# Patient Record
Sex: Female | Born: 1940
Health system: Southern US, Community
[De-identification: ages and names within clinical notes are randomized; demographics above are authoritative.]

## PROBLEM LIST (undated history)

## (undated) DIAGNOSIS — K859 Acute pancreatitis without necrosis or infection, unspecified: Secondary | ICD-10-CM

## (undated) DIAGNOSIS — Z9289 Personal history of other medical treatment: Secondary | ICD-10-CM

## (undated) DIAGNOSIS — E782 Mixed hyperlipidemia: Secondary | ICD-10-CM

## (undated) DIAGNOSIS — T7840XA Allergy, unspecified, initial encounter: Secondary | ICD-10-CM

## (undated) DIAGNOSIS — I679 Cerebrovascular disease, unspecified: Secondary | ICD-10-CM

## (undated) DIAGNOSIS — K602 Anal fissure, unspecified: Secondary | ICD-10-CM

## (undated) DIAGNOSIS — IMO0002 Reserved for concepts with insufficient information to code with codable children: Secondary | ICD-10-CM

## (undated) DIAGNOSIS — I639 Cerebral infarction, unspecified: Secondary | ICD-10-CM

## (undated) DIAGNOSIS — I341 Nonrheumatic mitral (valve) prolapse: Secondary | ICD-10-CM

## (undated) DIAGNOSIS — G894 Chronic pain syndrome: Secondary | ICD-10-CM

## (undated) DIAGNOSIS — M542 Cervicalgia: Secondary | ICD-10-CM

## (undated) DIAGNOSIS — M1990 Unspecified osteoarthritis, unspecified site: Secondary | ICD-10-CM

## (undated) DIAGNOSIS — I10 Essential (primary) hypertension: Secondary | ICD-10-CM

## (undated) DIAGNOSIS — I2510 Atherosclerotic heart disease of native coronary artery without angina pectoris: Secondary | ICD-10-CM

## (undated) DIAGNOSIS — K5790 Diverticulosis of intestine, part unspecified, without perforation or abscess without bleeding: Secondary | ICD-10-CM

## (undated) DIAGNOSIS — G709 Myoneural disorder, unspecified: Secondary | ICD-10-CM

## (undated) DIAGNOSIS — K559 Vascular disorder of intestine, unspecified: Secondary | ICD-10-CM

## (undated) DIAGNOSIS — K589 Irritable bowel syndrome without diarrhea: Secondary | ICD-10-CM

## (undated) DIAGNOSIS — K648 Other hemorrhoids: Secondary | ICD-10-CM

## (undated) DIAGNOSIS — N390 Urinary tract infection, site not specified: Secondary | ICD-10-CM

## (undated) DIAGNOSIS — H269 Unspecified cataract: Secondary | ICD-10-CM

## (undated) DIAGNOSIS — K219 Gastro-esophageal reflux disease without esophagitis: Secondary | ICD-10-CM

## (undated) DIAGNOSIS — R112 Nausea with vomiting, unspecified: Secondary | ICD-10-CM

## (undated) DIAGNOSIS — Z8679 Personal history of other diseases of the circulatory system: Secondary | ICD-10-CM

## (undated) DIAGNOSIS — D126 Benign neoplasm of colon, unspecified: Secondary | ICD-10-CM

## (undated) DIAGNOSIS — E119 Type 2 diabetes mellitus without complications: Secondary | ICD-10-CM

## (undated) DIAGNOSIS — R351 Nocturia: Secondary | ICD-10-CM

## (undated) DIAGNOSIS — Z9889 Other specified postprocedural states: Secondary | ICD-10-CM

## (undated) DIAGNOSIS — F419 Anxiety disorder, unspecified: Secondary | ICD-10-CM

## (undated) DIAGNOSIS — Z8719 Personal history of other diseases of the digestive system: Secondary | ICD-10-CM

## (undated) DIAGNOSIS — I493 Ventricular premature depolarization: Secondary | ICD-10-CM

## (undated) DIAGNOSIS — G8929 Other chronic pain: Secondary | ICD-10-CM

## (undated) DIAGNOSIS — Z96659 Presence of unspecified artificial knee joint: Secondary | ICD-10-CM

## (undated) DIAGNOSIS — R42 Dizziness and giddiness: Secondary | ICD-10-CM

## (undated) DIAGNOSIS — T8459XA Infection and inflammatory reaction due to other internal joint prosthesis, initial encounter: Secondary | ICD-10-CM

## (undated) DIAGNOSIS — N189 Chronic kidney disease, unspecified: Secondary | ICD-10-CM

## (undated) DIAGNOSIS — I219 Acute myocardial infarction, unspecified: Secondary | ICD-10-CM

## (undated) HISTORY — DX: Benign neoplasm of colon, unspecified: D12.6

## (undated) HISTORY — DX: Urinary tract infection, site not specified: N39.0

## (undated) HISTORY — PX: TOTAL ABDOMINAL HYSTERECTOMY: SHX209

## (undated) HISTORY — DX: Ventricular premature depolarization: I49.3

## (undated) HISTORY — DX: Anxiety disorder, unspecified: F41.9

## (undated) HISTORY — DX: Unspecified osteoarthritis, unspecified site: M19.90

## (undated) HISTORY — DX: Personal history of other medical treatment: Z92.89

## (undated) HISTORY — DX: Anal fissure, unspecified: K60.2

## (undated) HISTORY — PX: TONSILLECTOMY: SHX5217

## (undated) HISTORY — DX: Allergy, unspecified, initial encounter: T78.40XA

## (undated) HISTORY — DX: Essential (primary) hypertension: I10

## (undated) HISTORY — DX: Gastro-esophageal reflux disease without esophagitis: K21.9

## (undated) HISTORY — PX: POLYPECTOMY: SHX149

## (undated) HISTORY — DX: Vascular disorder of intestine, unspecified: K55.9

## (undated) HISTORY — DX: Diverticulosis of intestine, part unspecified, without perforation or abscess without bleeding: K57.90

## (undated) HISTORY — DX: Dizziness and giddiness: R42

## (undated) HISTORY — DX: Reserved for concepts with insufficient information to code with codable children: IMO0002

## (undated) HISTORY — DX: Personal history of other diseases of the circulatory system: Z86.79

## (undated) HISTORY — DX: Atherosclerotic heart disease of native coronary artery without angina pectoris: I25.10

## (undated) HISTORY — DX: Cerebrovascular disease, unspecified: I67.9

## (undated) HISTORY — DX: Type 2 diabetes mellitus without complications: E11.9

## (undated) HISTORY — PX: TEMPOROMANDIBULAR JOINT SURGERY: SHX35

## (undated) HISTORY — DX: Other chronic pain: G89.29

## (undated) HISTORY — DX: Other hemorrhoids: K64.8

## (undated) HISTORY — DX: Mixed hyperlipidemia: E78.2

## (undated) HISTORY — DX: Cerebral infarction, unspecified: I63.9

## (undated) HISTORY — DX: Acute myocardial infarction, unspecified: I21.9

## (undated) HISTORY — DX: Unspecified cataract: H26.9

## (undated) HISTORY — DX: Irritable bowel syndrome, unspecified: K58.9

## (undated) HISTORY — PX: NECK SURGERY: SHX720

## (undated) HISTORY — DX: Myoneural disorder, unspecified: G70.9

## (undated) HISTORY — DX: Chronic pain syndrome: G89.4

## (undated) HISTORY — DX: Cervicalgia: M54.2

## (undated) HISTORY — PX: COLONOSCOPY: SHX174

---
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
## 1998-12-08 ENCOUNTER — Other Ambulatory Visit: Admission: RE | Admit: 1998-12-08 | Discharge: 1998-12-08 | Payer: Self-pay | Admitting: Obstetrics and Gynecology

## 1998-12-31 ENCOUNTER — Emergency Department (HOSPITAL_COMMUNITY): Admission: EM | Admit: 1998-12-31 | Discharge: 1998-12-31 | Payer: Self-pay | Admitting: Emergency Medicine

## 1999-12-20 ENCOUNTER — Other Ambulatory Visit: Admission: RE | Admit: 1999-12-20 | Discharge: 1999-12-20 | Payer: Self-pay | Admitting: Obstetrics and Gynecology

## 2001-01-06 ENCOUNTER — Other Ambulatory Visit: Admission: RE | Admit: 2001-01-06 | Discharge: 2001-01-06 | Payer: Self-pay | Admitting: Obstetrics and Gynecology

## 2001-12-23 ENCOUNTER — Ambulatory Visit (HOSPITAL_COMMUNITY): Admission: RE | Admit: 2001-12-23 | Discharge: 2001-12-23 | Payer: Self-pay | Admitting: Internal Medicine

## 2001-12-23 ENCOUNTER — Encounter: Payer: Self-pay | Admitting: Internal Medicine

## 2002-02-02 ENCOUNTER — Other Ambulatory Visit: Admission: RE | Admit: 2002-02-02 | Discharge: 2002-02-02 | Payer: Self-pay | Admitting: Obstetrics and Gynecology

## 2002-03-25 ENCOUNTER — Ambulatory Visit (HOSPITAL_COMMUNITY): Admission: RE | Admit: 2002-03-25 | Discharge: 2002-03-25 | Payer: Self-pay | Admitting: Neurosurgery

## 2002-03-25 ENCOUNTER — Encounter: Payer: Self-pay | Admitting: Neurosurgery

## 2002-03-27 ENCOUNTER — Encounter: Admission: RE | Admit: 2002-03-27 | Discharge: 2002-03-27 | Payer: Self-pay | Admitting: Neurosurgery

## 2002-03-27 ENCOUNTER — Encounter: Payer: Self-pay | Admitting: Neurosurgery

## 2002-04-03 ENCOUNTER — Encounter: Payer: Self-pay | Admitting: Neurosurgery

## 2002-04-06 ENCOUNTER — Encounter: Payer: Self-pay | Admitting: Neurosurgery

## 2002-04-06 ENCOUNTER — Inpatient Hospital Stay (HOSPITAL_COMMUNITY): Admission: RE | Admit: 2002-04-06 | Discharge: 2002-04-08 | Payer: Self-pay | Admitting: Neurosurgery

## 2003-04-23 ENCOUNTER — Other Ambulatory Visit: Admission: RE | Admit: 2003-04-23 | Discharge: 2003-04-23 | Payer: Self-pay | Admitting: Obstetrics and Gynecology

## 2003-08-25 ENCOUNTER — Encounter: Admission: RE | Admit: 2003-08-25 | Discharge: 2003-08-25 | Payer: Self-pay | Admitting: Urology

## 2003-08-26 ENCOUNTER — Ambulatory Visit (HOSPITAL_COMMUNITY): Admission: RE | Admit: 2003-08-26 | Discharge: 2003-08-26 | Payer: Self-pay | Admitting: Urology

## 2003-08-26 ENCOUNTER — Ambulatory Visit (HOSPITAL_BASED_OUTPATIENT_CLINIC_OR_DEPARTMENT_OTHER): Admission: RE | Admit: 2003-08-26 | Discharge: 2003-08-26 | Payer: Self-pay | Admitting: Urology

## 2003-09-13 ENCOUNTER — Ambulatory Visit (HOSPITAL_BASED_OUTPATIENT_CLINIC_OR_DEPARTMENT_OTHER): Admission: RE | Admit: 2003-09-13 | Discharge: 2003-09-13 | Payer: Self-pay | Admitting: Urology

## 2004-05-12 ENCOUNTER — Other Ambulatory Visit: Admission: RE | Admit: 2004-05-12 | Discharge: 2004-05-12 | Payer: Self-pay | Admitting: Obstetrics and Gynecology

## 2004-07-13 ENCOUNTER — Ambulatory Visit: Payer: Self-pay | Admitting: Pulmonary Disease

## 2004-08-18 ENCOUNTER — Ambulatory Visit: Payer: Self-pay | Admitting: Pulmonary Disease

## 2004-12-11 ENCOUNTER — Ambulatory Visit: Payer: Self-pay | Admitting: Pulmonary Disease

## 2005-01-10 ENCOUNTER — Ambulatory Visit: Payer: Self-pay | Admitting: Gastroenterology

## 2005-01-25 ENCOUNTER — Ambulatory Visit: Payer: Self-pay | Admitting: Gastroenterology

## 2005-03-12 ENCOUNTER — Ambulatory Visit: Payer: Self-pay | Admitting: Pulmonary Disease

## 2005-06-12 ENCOUNTER — Ambulatory Visit: Payer: Self-pay | Admitting: Pulmonary Disease

## 2005-06-20 ENCOUNTER — Ambulatory Visit: Payer: Self-pay | Admitting: Pulmonary Disease

## 2005-07-04 HISTORY — PX: CERVICAL SPINE SURGERY: SHX589

## 2005-07-16 ENCOUNTER — Ambulatory Visit (HOSPITAL_COMMUNITY): Admission: RE | Admit: 2005-07-16 | Discharge: 2005-07-16 | Payer: Self-pay | Admitting: Neurosurgery

## 2005-08-02 ENCOUNTER — Inpatient Hospital Stay (HOSPITAL_COMMUNITY): Admission: RE | Admit: 2005-08-02 | Discharge: 2005-08-03 | Payer: Self-pay | Admitting: Neurosurgery

## 2005-09-03 DIAGNOSIS — I219 Acute myocardial infarction, unspecified: Secondary | ICD-10-CM

## 2005-09-03 HISTORY — PX: CARDIAC CATHETERIZATION: SHX172

## 2005-09-03 HISTORY — PX: CHOLECYSTECTOMY: SHX55

## 2005-09-03 HISTORY — PX: BLADDER REPAIR: SHX76

## 2005-09-03 HISTORY — DX: Acute myocardial infarction, unspecified: I21.9

## 2005-10-15 ENCOUNTER — Ambulatory Visit: Payer: Self-pay | Admitting: Pulmonary Disease

## 2006-02-12 ENCOUNTER — Ambulatory Visit: Payer: Self-pay | Admitting: Pulmonary Disease

## 2006-02-20 ENCOUNTER — Encounter: Admission: RE | Admit: 2006-02-20 | Discharge: 2006-03-19 | Payer: Self-pay | Admitting: Neurosurgery

## 2006-03-25 ENCOUNTER — Ambulatory Visit: Payer: Self-pay | Admitting: Pulmonary Disease

## 2006-03-26 ENCOUNTER — Inpatient Hospital Stay (HOSPITAL_COMMUNITY): Admission: EM | Admit: 2006-03-26 | Discharge: 2006-03-29 | Payer: Self-pay | Admitting: Emergency Medicine

## 2006-03-26 ENCOUNTER — Ambulatory Visit: Payer: Self-pay | Admitting: Cardiology

## 2006-04-09 ENCOUNTER — Ambulatory Visit: Payer: Self-pay | Admitting: Pulmonary Disease

## 2006-04-09 ENCOUNTER — Ambulatory Visit: Payer: Self-pay | Admitting: Cardiology

## 2006-06-07 ENCOUNTER — Ambulatory Visit: Payer: Self-pay | Admitting: Cardiovascular Disease

## 2006-06-17 ENCOUNTER — Ambulatory Visit: Payer: Self-pay | Admitting: Pulmonary Disease

## 2006-06-24 ENCOUNTER — Ambulatory Visit: Payer: Self-pay | Admitting: Pulmonary Disease

## 2006-06-24 LAB — CONVERTED CEMR LAB
ALT: 38 units/L (ref 0–40)
AST: 25 units/L (ref 0–37)
Albumin: 3.4 g/dL — ABNORMAL LOW (ref 3.5–5.2)
Alkaline Phosphatase: 79 units/L (ref 39–117)
BUN: 15 mg/dL (ref 6–23)
Basophils Absolute: 0.1 10*3/uL (ref 0.0–0.1)
Basophils Relative: 0.7 % (ref 0.0–1.0)
CO2: 32 meq/L (ref 19–32)
Calcium: 8.2 mg/dL — ABNORMAL LOW (ref 8.4–10.5)
Chloride: 106 meq/L (ref 96–112)
Chol/HDL Ratio, serum: 3.8
Cholesterol: 116 mg/dL (ref 0–200)
Creatinine, Ser: 0.9 mg/dL (ref 0.4–1.2)
Eosinophil percent: 3 % (ref 0.0–5.0)
GFR calc non Af Amer: 67 mL/min
Glomerular Filtration Rate, Af Am: 81 mL/min/{1.73_m2}
Glucose, Bld: 126 mg/dL — ABNORMAL HIGH (ref 70–99)
HCT: 39.5 % (ref 36.0–46.0)
HDL: 30.6 mg/dL — ABNORMAL LOW (ref >39.0)
Hemoglobin: 13.2 g/dL (ref 12.0–15.0)
Hgb A1c MFr Bld: 5.8 % (ref 4.6–6.0)
LDL Cholesterol: 53 mg/dL (ref 0–99)
Lymphocytes Relative: 23 % (ref 12.0–46.0)
MCHC: 33.4 g/dL (ref 30.0–36.0)
MCV: 96.5 fL (ref 78.0–100.0)
Monocytes Absolute: 1 10*3/uL — ABNORMAL HIGH (ref 0.2–0.7)
Monocytes Relative: 8.4 % (ref 3.0–11.0)
Neutro Abs: 7.5 10*3/uL (ref 1.4–7.7)
Neutrophils Relative %: 64.9 % (ref 43.0–77.0)
Platelets: 351 10*3/uL (ref 150–400)
Potassium: 4.3 meq/L (ref 3.5–5.1)
RBC: 4.1 M/uL (ref 3.87–5.11)
RDW: 12.9 % (ref 11.5–14.6)
Sodium: 142 meq/L (ref 135–145)
TSH: 5.48 microintl units/mL (ref 0.35–5.50)
Total Bilirubin: 0.6 mg/dL (ref 0.3–1.2)
Total Protein: 5.8 g/dL — ABNORMAL LOW (ref 6.0–8.3)
Triglyceride fasting, serum: 164 mg/dL — ABNORMAL HIGH (ref 0–149)
VLDL: 33 mg/dL (ref 0–40)
WBC: 11.7 10*3/uL — ABNORMAL HIGH (ref 4.5–10.5)

## 2006-07-03 ENCOUNTER — Encounter: Admission: RE | Admit: 2006-07-03 | Discharge: 2006-07-03 | Payer: Self-pay | Admitting: Obstetrics and Gynecology

## 2006-08-20 ENCOUNTER — Ambulatory Visit: Payer: Self-pay | Admitting: Pulmonary Disease

## 2006-11-13 ENCOUNTER — Ambulatory Visit: Payer: Self-pay | Admitting: Gastroenterology

## 2006-11-13 LAB — CONVERTED CEMR LAB
ALT: 32 units/L (ref 0–40)
AST: 25 units/L (ref 0–37)
Albumin: 3.6 g/dL (ref 3.5–5.2)
Alkaline Phosphatase: 87 units/L (ref 39–117)
Amylase: 33 units/L (ref 27–131)
BUN: 17 mg/dL (ref 6–23)
Basophils Absolute: 0.1 10*3/uL (ref 0.0–0.1)
Basophils Relative: 1.1 % — ABNORMAL HIGH (ref 0.0–1.0)
CO2: 34 meq/L — ABNORMAL HIGH (ref 19–32)
Calcium: 8.8 mg/dL (ref 8.4–10.5)
Chloride: 102 meq/L (ref 96–112)
Creatinine, Ser: 0.7 mg/dL (ref 0.4–1.2)
Eosinophils Absolute: 0.3 10*3/uL (ref 0.0–0.6)
Eosinophils Relative: 3.4 % (ref 0.0–5.0)
GFR calc Af Amer: 107 mL/min
GFR calc non Af Amer: 89 mL/min
Glucose, Bld: 167 mg/dL — ABNORMAL HIGH (ref 70–99)
HCT: 41.5 % (ref 36.0–46.0)
Hemoglobin: 14.3 g/dL (ref 12.0–15.0)
Lipase: 27 units/L (ref 11.0–59.0)
Lymphocytes Relative: 26.4 % (ref 12.0–46.0)
MCHC: 34.4 g/dL (ref 30.0–36.0)
MCV: 95.7 fL (ref 78.0–100.0)
Monocytes Absolute: 0.6 10*3/uL (ref 0.2–0.7)
Monocytes Relative: 7.5 % (ref 3.0–11.0)
Neutro Abs: 5.1 10*3/uL (ref 1.4–7.7)
Neutrophils Relative %: 61.6 % (ref 43.0–77.0)
Platelets: 326 10*3/uL (ref 150–400)
Potassium: 4.4 meq/L (ref 3.5–5.1)
RBC: 4.33 M/uL (ref 3.87–5.11)
RDW: 12.7 % (ref 11.5–14.6)
Sed Rate: 5 mm/hr (ref 0–25)
Sodium: 141 meq/L (ref 135–145)
Total Bilirubin: 0.6 mg/dL (ref 0.3–1.2)
Total Protein: 6.3 g/dL (ref 6.0–8.3)
WBC: 8.3 10*3/uL (ref 4.5–10.5)

## 2006-11-15 ENCOUNTER — Ambulatory Visit: Payer: Self-pay | Admitting: Cardiovascular Disease

## 2006-12-11 ENCOUNTER — Ambulatory Visit: Payer: Self-pay | Admitting: Gastroenterology

## 2006-12-17 ENCOUNTER — Encounter (INDEPENDENT_AMBULATORY_CARE_PROVIDER_SITE_OTHER): Payer: Self-pay | Admitting: Specialist

## 2006-12-17 ENCOUNTER — Ambulatory Visit: Payer: Self-pay | Admitting: Gastroenterology

## 2007-01-02 ENCOUNTER — Ambulatory Visit: Payer: Self-pay | Admitting: Cardiovascular Disease

## 2007-01-14 ENCOUNTER — Ambulatory Visit: Payer: Self-pay | Admitting: Pulmonary Disease

## 2007-04-16 ENCOUNTER — Ambulatory Visit: Payer: Self-pay | Admitting: Pulmonary Disease

## 2007-04-18 LAB — CONVERTED CEMR LAB
ALT: 34 units/L (ref 0–35)
AST: 27 units/L (ref 0–37)
Albumin: 3.6 g/dL (ref 3.5–5.2)
Alkaline Phosphatase: 83 units/L (ref 39–117)
BUN: 13 mg/dL (ref 6–23)
Basophils Absolute: 0 10*3/uL (ref 0.0–0.1)
Basophils Relative: 0.4 % (ref 0.0–1.0)
Bilirubin, Direct: 0.1 mg/dL (ref 0.0–0.3)
CO2: 37 meq/L — ABNORMAL HIGH (ref 19–32)
Calcium: 9.1 mg/dL (ref 8.4–10.5)
Chloride: 113 meq/L — ABNORMAL HIGH (ref 96–112)
Cholesterol: 127 mg/dL (ref 0–200)
Creatinine, Ser: 0.8 mg/dL (ref 0.4–1.2)
Direct LDL: 60.5 mg/dL
Eosinophils Absolute: 0.2 10*3/uL (ref 0.0–0.6)
Eosinophils Relative: 3.3 % (ref 0.0–5.0)
GFR calc Af Amer: 92 mL/min
GFR calc non Af Amer: 76 mL/min
Glucose, Bld: 162 mg/dL — ABNORMAL HIGH (ref 70–99)
HCT: 39.9 % (ref 36.0–46.0)
HDL: 34.4 mg/dL — ABNORMAL LOW (ref >39.0)
Hemoglobin: 13.9 g/dL (ref 12.0–15.0)
Hgb A1c MFr Bld: 6.5 % — ABNORMAL HIGH (ref 4.6–6.0)
Lymphocytes Relative: 39.1 % (ref 12.0–46.0)
MCHC: 34.9 g/dL (ref 30.0–36.0)
MCV: 97.3 fL (ref 78.0–100.0)
Monocytes Absolute: 0.7 10*3/uL (ref 0.2–0.7)
Monocytes Relative: 10 % (ref 3.0–11.0)
Neutro Abs: 3.2 10*3/uL (ref 1.4–7.7)
Neutrophils Relative %: 47.2 % (ref 43.0–77.0)
Platelets: 318 10*3/uL (ref 150–400)
Potassium: 5 meq/L (ref 3.5–5.1)
RBC: 4.1 M/uL (ref 3.87–5.11)
RDW: 12.4 % (ref 11.5–14.6)
Sodium: 154 meq/L — ABNORMAL HIGH (ref 135–145)
TSH: 3.29 microintl units/mL (ref 0.35–5.50)
Total Bilirubin: 0.8 mg/dL (ref 0.3–1.2)
Total CHOL/HDL Ratio: 3.7
Total Protein: 6.2 g/dL (ref 6.0–8.3)
Triglycerides: 201 mg/dL (ref 0–149)
VLDL: 40 mg/dL (ref 0–40)
WBC: 6.7 10*3/uL (ref 4.5–10.5)

## 2007-06-13 DIAGNOSIS — M1990 Unspecified osteoarthritis, unspecified site: Secondary | ICD-10-CM | POA: Insufficient documentation

## 2007-06-13 DIAGNOSIS — K5730 Diverticulosis of large intestine without perforation or abscess without bleeding: Secondary | ICD-10-CM | POA: Insufficient documentation

## 2007-06-13 DIAGNOSIS — E785 Hyperlipidemia, unspecified: Secondary | ICD-10-CM | POA: Insufficient documentation

## 2007-06-13 DIAGNOSIS — K589 Irritable bowel syndrome without diarrhea: Secondary | ICD-10-CM | POA: Insufficient documentation

## 2007-06-13 DIAGNOSIS — K219 Gastro-esophageal reflux disease without esophagitis: Secondary | ICD-10-CM | POA: Insufficient documentation

## 2007-06-13 DIAGNOSIS — G894 Chronic pain syndrome: Secondary | ICD-10-CM | POA: Insufficient documentation

## 2007-06-13 DIAGNOSIS — E782 Mixed hyperlipidemia: Secondary | ICD-10-CM | POA: Insufficient documentation

## 2007-06-13 DIAGNOSIS — F418 Other specified anxiety disorders: Secondary | ICD-10-CM | POA: Insufficient documentation

## 2007-06-18 ENCOUNTER — Ambulatory Visit: Payer: Self-pay | Admitting: Pulmonary Disease

## 2007-07-28 ENCOUNTER — Ambulatory Visit: Payer: Self-pay | Admitting: Cardiovascular Disease

## 2007-08-07 ENCOUNTER — Encounter: Payer: Self-pay | Admitting: Cardiovascular Disease

## 2007-08-07 ENCOUNTER — Ambulatory Visit: Payer: Self-pay

## 2007-08-15 ENCOUNTER — Ambulatory Visit: Payer: Self-pay | Admitting: Pulmonary Disease

## 2007-08-15 DIAGNOSIS — E118 Type 2 diabetes mellitus with unspecified complications: Secondary | ICD-10-CM | POA: Insufficient documentation

## 2007-08-15 DIAGNOSIS — E119 Type 2 diabetes mellitus without complications: Secondary | ICD-10-CM | POA: Insufficient documentation

## 2007-10-15 ENCOUNTER — Ambulatory Visit: Payer: Self-pay | Admitting: Pulmonary Disease

## 2007-10-15 DIAGNOSIS — R42 Dizziness and giddiness: Secondary | ICD-10-CM | POA: Insufficient documentation

## 2007-10-16 ENCOUNTER — Ambulatory Visit: Payer: Self-pay | Admitting: Cardiology

## 2007-10-21 LAB — CONVERTED CEMR LAB
ALT: 41 units/L — ABNORMAL HIGH (ref 0–35)
AST: 26 units/L (ref 0–37)
Albumin: 3.7 g/dL (ref 3.5–5.2)
Alkaline Phosphatase: 75 units/L (ref 39–117)
BUN: 15 mg/dL (ref 6–23)
Basophils Absolute: 0 10*3/uL (ref 0.0–0.1)
Basophils Relative: 0.3 % (ref 0.0–1.0)
Bilirubin, Direct: 0.1 mg/dL (ref 0.0–0.3)
CO2: 33 meq/L — ABNORMAL HIGH (ref 19–32)
Calcium: 9 mg/dL (ref 8.4–10.5)
Chloride: 103 meq/L (ref 96–112)
Creatinine, Ser: 0.9 mg/dL (ref 0.4–1.2)
Eosinophils Absolute: 0.1 10*3/uL (ref 0.0–0.6)
Eosinophils Relative: 1.9 % (ref 0.0–5.0)
GFR calc Af Amer: 80 mL/min
GFR calc non Af Amer: 66 mL/min
Glucose, Bld: 154 mg/dL — ABNORMAL HIGH (ref 70–99)
HCT: 42.5 % (ref 36.0–46.0)
Hemoglobin: 14.8 g/dL (ref 12.0–15.0)
Lymphocytes Relative: 36.7 % (ref 12.0–46.0)
MCHC: 34.8 g/dL (ref 30.0–36.0)
MCV: 97.9 fL (ref 78.0–100.0)
Monocytes Absolute: 0.5 10*3/uL (ref 0.2–0.7)
Monocytes Relative: 7.1 % (ref 3.0–11.0)
Neutro Abs: 3.8 10*3/uL (ref 1.4–7.7)
Neutrophils Relative %: 54 % (ref 43.0–77.0)
Platelets: 295 10*3/uL (ref 150–400)
Potassium: 4.1 meq/L (ref 3.5–5.1)
RBC: 4.34 M/uL (ref 3.87–5.11)
RDW: 12.6 % (ref 11.5–14.6)
Sodium: 141 meq/L (ref 135–145)
TSH: 2.29 microintl units/mL (ref 0.35–5.50)
Total Bilirubin: 0.5 mg/dL (ref 0.3–1.2)
Total Protein: 6.5 g/dL (ref 6.0–8.3)
WBC: 7 10*3/uL (ref 4.5–10.5)

## 2007-10-22 ENCOUNTER — Encounter (INDEPENDENT_AMBULATORY_CARE_PROVIDER_SITE_OTHER): Payer: Self-pay | Admitting: *Deleted

## 2007-10-28 ENCOUNTER — Ambulatory Visit: Payer: Self-pay | Admitting: Pulmonary Disease

## 2007-11-18 ENCOUNTER — Telehealth (INDEPENDENT_AMBULATORY_CARE_PROVIDER_SITE_OTHER): Payer: Self-pay | Admitting: *Deleted

## 2007-11-18 ENCOUNTER — Ambulatory Visit: Payer: Self-pay | Admitting: Pulmonary Disease

## 2007-11-26 ENCOUNTER — Encounter: Payer: Self-pay | Admitting: Pulmonary Disease

## 2007-11-26 ENCOUNTER — Ambulatory Visit: Payer: Self-pay

## 2007-12-30 ENCOUNTER — Ambulatory Visit: Payer: Self-pay | Admitting: Cardiovascular Disease

## 2008-01-09 ENCOUNTER — Encounter: Payer: Self-pay | Admitting: Pulmonary Disease

## 2008-01-14 ENCOUNTER — Ambulatory Visit: Payer: Self-pay | Admitting: Pulmonary Disease

## 2008-01-14 DIAGNOSIS — M542 Cervicalgia: Secondary | ICD-10-CM | POA: Insufficient documentation

## 2008-01-14 DIAGNOSIS — I2510 Atherosclerotic heart disease of native coronary artery without angina pectoris: Secondary | ICD-10-CM | POA: Insufficient documentation

## 2008-01-14 DIAGNOSIS — I679 Cerebrovascular disease, unspecified: Secondary | ICD-10-CM | POA: Insufficient documentation

## 2008-01-14 DIAGNOSIS — R32 Unspecified urinary incontinence: Secondary | ICD-10-CM | POA: Insufficient documentation

## 2008-01-19 ENCOUNTER — Ambulatory Visit: Payer: Self-pay | Admitting: Cardiology

## 2008-02-05 ENCOUNTER — Ambulatory Visit: Payer: Self-pay

## 2008-02-05 LAB — CONVERTED CEMR LAB
ALT: 31 units/L (ref 0–35)
AST: 25 units/L (ref 0–37)
Albumin: 3.6 g/dL (ref 3.5–5.2)
Alkaline Phosphatase: 64 units/L (ref 39–117)
Bilirubin, Direct: 0.1 mg/dL (ref 0.0–0.3)
Cholesterol: 141 mg/dL (ref 0–200)
Direct LDL: 59.8 mg/dL
HDL: 28.9 mg/dL — ABNORMAL LOW (ref >39.0)
Total Bilirubin: 0.9 mg/dL (ref 0.3–1.2)
Total CHOL/HDL Ratio: 4.9
Total Protein: 6.5 g/dL (ref 6.0–8.3)
Triglycerides: 280 mg/dL (ref 0–149)
VLDL: 56 mg/dL — ABNORMAL HIGH (ref 0–40)

## 2008-02-09 ENCOUNTER — Ambulatory Visit: Payer: Self-pay | Admitting: Cardiology

## 2008-03-08 ENCOUNTER — Telehealth (INDEPENDENT_AMBULATORY_CARE_PROVIDER_SITE_OTHER): Payer: Self-pay | Admitting: *Deleted

## 2008-03-11 ENCOUNTER — Ambulatory Visit: Payer: Self-pay | Admitting: Gastroenterology

## 2008-03-23 ENCOUNTER — Ambulatory Visit: Payer: Self-pay | Admitting: Cardiovascular Disease

## 2008-03-23 LAB — CONVERTED CEMR LAB
ALT: 27 units/L (ref 0–35)
AST: 29 units/L (ref 0–37)
Albumin: 3.8 g/dL (ref 3.5–5.2)
Alkaline Phosphatase: 67 units/L (ref 39–117)
Bilirubin, Direct: 0.1 mg/dL (ref 0.0–0.3)
Cholesterol: 129 mg/dL (ref 0–200)
Direct LDL: 65 mg/dL
HDL: 36 mg/dL — ABNORMAL LOW (ref >39.0)
Total Bilirubin: 0.8 mg/dL (ref 0.3–1.2)
Total CHOL/HDL Ratio: 3.6
Total Protein: 6.8 g/dL (ref 6.0–8.3)
Triglycerides: 265 mg/dL (ref 0–149)
VLDL: 53 mg/dL — ABNORMAL HIGH (ref 0–40)

## 2008-03-25 ENCOUNTER — Ambulatory Visit: Payer: Self-pay | Admitting: Internal Medicine

## 2008-05-13 ENCOUNTER — Ambulatory Visit: Payer: Self-pay | Admitting: Pulmonary Disease

## 2008-05-13 LAB — CONVERTED CEMR LAB
AntiThromb III Func: 102 % (ref 76–126)
Anticardiolipin IgA: 9 (ref <13)
Anticardiolipin IgG: <7 (ref <11)
Anticardiolipin IgM: <7 (ref <10)
Homocysteine: 7.4 micromoles/L (ref 4.0–15.4)
Protein S Ag, Total: 106 % (ref 70–140)
Vit D, 1,25-Dihydroxy: 27 — ABNORMAL LOW (ref 30–89)

## 2008-05-16 DIAGNOSIS — I4949 Other premature depolarization: Secondary | ICD-10-CM | POA: Insufficient documentation

## 2008-05-16 LAB — CONVERTED CEMR LAB
ALT: 27 units/L (ref 0–35)
AST: 25 units/L (ref 0–37)
Albumin: 3.8 g/dL (ref 3.5–5.2)
Alkaline Phosphatase: 60 units/L (ref 39–117)
BUN: 16 mg/dL (ref 6–23)
Basophils Absolute: 0.1 10*3/uL (ref 0.0–0.1)
Basophils Relative: 1.5 % (ref 0.0–3.0)
Bilirubin, Direct: 0.1 mg/dL (ref 0.0–0.3)
CO2: 31 meq/L (ref 19–32)
Calcium: 9 mg/dL (ref 8.4–10.5)
Chloride: 103 meq/L (ref 96–112)
Cholesterol: 152 mg/dL (ref 0–200)
Creatinine, Ser: 0.7 mg/dL (ref 0.4–1.2)
Direct LDL: 70.1 mg/dL
Eosinophils Absolute: 0.2 10*3/uL (ref 0.0–0.7)
Eosinophils Relative: 3.5 % (ref 0.0–5.0)
GFR calc Af Amer: 107 mL/min
GFR calc non Af Amer: 88 mL/min
Glucose, Bld: 141 mg/dL — ABNORMAL HIGH (ref 70–99)
HCT: 41 % (ref 36.0–46.0)
HDL: 29.6 mg/dL — ABNORMAL LOW (ref >39.0)
Hemoglobin: 14 g/dL (ref 12.0–15.0)
Hgb A1c MFr Bld: 6.1 % — ABNORMAL HIGH (ref 4.6–6.0)
Lymphocytes Relative: 32.9 % (ref 12.0–46.0)
MCHC: 34.2 g/dL (ref 30.0–36.0)
MCV: 98.8 fL (ref 78.0–100.0)
Monocytes Absolute: 0.5 10*3/uL (ref 0.1–1.0)
Monocytes Relative: 9.3 % (ref 3.0–12.0)
Neutro Abs: 2.8 10*3/uL (ref 1.4–7.7)
Neutrophils Relative %: 52.8 % (ref 43.0–77.0)
Platelets: 265 10*3/uL (ref 150–400)
Potassium: 3.9 meq/L (ref 3.5–5.1)
RBC: 4.15 M/uL (ref 3.87–5.11)
RDW: 12.1 % (ref 11.5–14.6)
Sodium: 143 meq/L (ref 135–145)
TSH: 2.24 microintl units/mL (ref 0.35–5.50)
Total Bilirubin: 0.8 mg/dL (ref 0.3–1.2)
Total CHOL/HDL Ratio: 5.1
Total Protein: 6.8 g/dL (ref 6.0–8.3)
Triglycerides: 225 mg/dL (ref 0–149)
VLDL: 45 mg/dL — ABNORMAL HIGH (ref 0–40)
WBC: 5.3 10*3/uL (ref 4.5–10.5)

## 2008-05-24 ENCOUNTER — Ambulatory Visit: Payer: Self-pay | Admitting: Cardiovascular Disease

## 2008-07-06 ENCOUNTER — Ambulatory Visit: Payer: Self-pay | Admitting: Cardiovascular Disease

## 2008-09-06 ENCOUNTER — Ambulatory Visit: Payer: Self-pay | Admitting: Cardiovascular Disease

## 2008-09-06 LAB — CONVERTED CEMR LAB
ALT: 31 units/L (ref 0–35)
AST: 28 units/L (ref 0–37)
Albumin: 3.6 g/dL (ref 3.5–5.2)
Alkaline Phosphatase: 66 units/L (ref 39–117)
Bilirubin, Direct: 0.1 mg/dL (ref 0.0–0.3)
Cholesterol: 137 mg/dL (ref 0–200)
Direct LDL: 58.1 mg/dL
HDL: 38.8 mg/dL — ABNORMAL LOW (ref >39.0)
Total Bilirubin: 0.6 mg/dL (ref 0.3–1.2)
Total CHOL/HDL Ratio: 3.5
Total Protein: 6.5 g/dL (ref 6.0–8.3)
Triglycerides: 203 mg/dL (ref 0–149)
VLDL: 41 mg/dL — ABNORMAL HIGH (ref 0–40)

## 2008-09-09 ENCOUNTER — Ambulatory Visit: Payer: Self-pay | Admitting: Cardiovascular Disease

## 2008-11-10 ENCOUNTER — Ambulatory Visit: Payer: Self-pay | Admitting: Pulmonary Disease

## 2008-11-10 DIAGNOSIS — N3000 Acute cystitis without hematuria: Secondary | ICD-10-CM | POA: Insufficient documentation

## 2008-11-10 LAB — CONVERTED CEMR LAB: Vit D, 25-Hydroxy: 38 ng/mL (ref 30–89)

## 2008-11-13 LAB — CONVERTED CEMR LAB
BUN: 11 mg/dL (ref 6–23)
Basophils Absolute: 0 10*3/uL (ref 0.0–0.1)
Basophils Relative: 0.6 % (ref 0.0–3.0)
Bilirubin Urine: NEGATIVE
CO2: 33 meq/L — ABNORMAL HIGH (ref 19–32)
Calcium: 9.1 mg/dL (ref 8.4–10.5)
Chloride: 101 meq/L (ref 96–112)
Creatinine, Ser: 0.8 mg/dL (ref 0.4–1.2)
Crystals: NEGATIVE
Eosinophils Absolute: 0.3 10*3/uL (ref 0.0–0.7)
Eosinophils Relative: 4.3 % (ref 0.0–5.0)
GFR calc Af Amer: 91 mL/min
GFR calc non Af Amer: 76 mL/min
Glucose, Bld: 140 mg/dL — ABNORMAL HIGH (ref 70–99)
HCT: 41.6 % (ref 36.0–46.0)
Hemoglobin, Urine: NEGATIVE
Hemoglobin: 14.2 g/dL (ref 12.0–15.0)
Ketones, ur: NEGATIVE mg/dL
Lymphocytes Relative: 40.7 % (ref 12.0–46.0)
MCHC: 34.1 g/dL (ref 30.0–36.0)
MCV: 97.8 fL (ref 78.0–100.0)
Monocytes Absolute: 0.6 10*3/uL (ref 0.1–1.0)
Monocytes Relative: 8.6 % (ref 3.0–12.0)
Mucus, UA: NEGATIVE
Neutro Abs: 3.4 10*3/uL (ref 1.4–7.7)
Neutrophils Relative %: 45.8 % (ref 43.0–77.0)
Nitrite: NEGATIVE
Platelets: 258 10*3/uL (ref 150–400)
Potassium: 3.8 meq/L (ref 3.5–5.1)
RBC: 4.25 M/uL (ref 3.87–5.11)
RDW: 12.4 % (ref 11.5–14.6)
Sodium: 142 meq/L (ref 135–145)
Specific Gravity, Urine: <=1.005 (ref 1.000–1.035)
Squamous Epithelial / HPF: NEGATIVE /lpf
TSH: 2.92 microintl units/mL (ref 0.35–5.50)
Total Protein, Urine: NEGATIVE mg/dL
Urine Glucose: NEGATIVE mg/dL
Urobilinogen, UA: 0.2 (ref 0.0–1.0)
WBC: 7.2 10*3/uL (ref 4.5–10.5)
pH: 6.5 (ref 5.0–8.0)

## 2009-01-06 ENCOUNTER — Encounter: Payer: Self-pay | Admitting: Pulmonary Disease

## 2009-01-12 ENCOUNTER — Encounter: Payer: Self-pay | Admitting: Pulmonary Disease

## 2009-02-01 ENCOUNTER — Ambulatory Visit: Payer: Self-pay | Admitting: Cardiovascular Disease

## 2009-02-03 ENCOUNTER — Ambulatory Visit: Payer: Self-pay | Admitting: Internal Medicine

## 2009-05-10 ENCOUNTER — Ambulatory Visit: Payer: Self-pay | Admitting: Pulmonary Disease

## 2009-05-15 LAB — CONVERTED CEMR LAB
ALT: 30 units/L (ref 0–35)
AST: 24 units/L (ref 0–37)
Albumin: 4.1 g/dL (ref 3.5–5.2)
Alkaline Phosphatase: 69 units/L (ref 39–117)
BUN: 12 mg/dL (ref 6–23)
Basophils Absolute: 0.1 10*3/uL (ref 0.0–0.1)
Basophils Relative: 0.6 % (ref 0.0–3.0)
Bilirubin, Direct: 0.1 mg/dL (ref 0.0–0.3)
CO2: 34 meq/L — ABNORMAL HIGH (ref 19–32)
Calcium: 9.2 mg/dL (ref 8.4–10.5)
Chloride: 101 meq/L (ref 96–112)
Cholesterol: 154 mg/dL (ref 0–200)
Creatinine, Ser: 0.7 mg/dL (ref 0.4–1.2)
Direct LDL: 81.1 mg/dL
Eosinophils Absolute: 0.3 10*3/uL (ref 0.0–0.7)
Eosinophils Relative: 2.9 % (ref 0.0–5.0)
GFR calc non Af Amer: 88.05 mL/min (ref >60)
Glucose, Bld: 136 mg/dL — ABNORMAL HIGH (ref 70–99)
HCT: 42.6 % (ref 36.0–46.0)
HDL: 41.8 mg/dL (ref >39.00)
Hemoglobin: 14.8 g/dL (ref 12.0–15.0)
Hgb A1c MFr Bld: 6.1 % (ref 4.6–6.5)
Lymphocytes Relative: 30.4 % (ref 12.0–46.0)
Lymphs Abs: 2.6 10*3/uL (ref 0.7–4.0)
MCHC: 34.8 g/dL (ref 30.0–36.0)
MCV: 97.8 fL (ref 78.0–100.0)
Monocytes Absolute: 0.6 10*3/uL (ref 0.1–1.0)
Monocytes Relative: 6.4 % (ref 3.0–12.0)
Neutro Abs: 5.1 10*3/uL (ref 1.4–7.7)
Neutrophils Relative %: 59.7 % (ref 43.0–77.0)
Platelets: 270 10*3/uL (ref 150.0–400.0)
Potassium: 4.5 meq/L (ref 3.5–5.1)
RBC: 4.36 M/uL (ref 3.87–5.11)
RDW: 12.3 % (ref 11.5–14.6)
Sodium: 142 meq/L (ref 135–145)
Total Bilirubin: 0.7 mg/dL (ref 0.3–1.2)
Total CHOL/HDL Ratio: 4
Total Protein: 6.9 g/dL (ref 6.0–8.3)
Triglycerides: 225 mg/dL — ABNORMAL HIGH (ref 0.0–149.0)
VLDL: 45 mg/dL — ABNORMAL HIGH (ref 0.0–40.0)
WBC: 8.7 10*3/uL (ref 4.5–10.5)

## 2009-05-20 ENCOUNTER — Encounter: Payer: Self-pay | Admitting: Pulmonary Disease

## 2009-08-03 DIAGNOSIS — I10 Essential (primary) hypertension: Secondary | ICD-10-CM | POA: Insufficient documentation

## 2009-08-03 DIAGNOSIS — Z8679 Personal history of other diseases of the circulatory system: Secondary | ICD-10-CM | POA: Insufficient documentation

## 2009-08-04 ENCOUNTER — Encounter (INDEPENDENT_AMBULATORY_CARE_PROVIDER_SITE_OTHER): Payer: Self-pay | Admitting: *Deleted

## 2009-08-04 ENCOUNTER — Ambulatory Visit: Payer: Self-pay | Admitting: Cardiovascular Disease

## 2009-08-04 LAB — CONVERTED CEMR LAB
BUN: 7 mg/dL (ref 6–23)
Basophils Absolute: 0.1 10*3/uL (ref 0.0–0.1)
Basophils Relative: 1.2 % (ref 0.0–3.0)
CO2: 32 meq/L (ref 19–32)
Calcium: 8.6 mg/dL (ref 8.4–10.5)
Chloride: 104 meq/L (ref 96–112)
Creatinine, Ser: 0.7 mg/dL (ref 0.4–1.2)
Eosinophils Absolute: 0.2 10*3/uL (ref 0.0–0.7)
Eosinophils Relative: 2.4 % (ref 0.0–5.0)
GFR calc non Af Amer: 87.99 mL/min (ref >60)
Glucose, Bld: 151 mg/dL — ABNORMAL HIGH (ref 70–99)
HCT: 41.7 % (ref 36.0–46.0)
Hemoglobin: 14.2 g/dL (ref 12.0–15.0)
INR: 1 (ref 0.8–1.0)
Lymphocytes Relative: 36.2 % (ref 12.0–46.0)
Lymphs Abs: 2.6 10*3/uL (ref 0.7–4.0)
MCHC: 34.1 g/dL (ref 30.0–36.0)
MCV: 99.7 fL (ref 78.0–100.0)
Monocytes Absolute: 0.5 10*3/uL (ref 0.1–1.0)
Monocytes Relative: 6.7 % (ref 3.0–12.0)
Neutro Abs: 3.7 10*3/uL (ref 1.4–7.7)
Neutrophils Relative %: 53.5 % (ref 43.0–77.0)
Platelets: 262 10*3/uL (ref 150.0–400.0)
Potassium: 4.3 meq/L (ref 3.5–5.1)
Prothrombin Time: 10.5 s (ref 9.1–11.7)
RBC: 4.18 M/uL (ref 3.87–5.11)
RDW: 12.8 % (ref 11.5–14.6)
Sodium: 148 meq/L — ABNORMAL HIGH (ref 135–145)
WBC: 7.1 10*3/uL (ref 4.5–10.5)

## 2009-08-05 ENCOUNTER — Encounter: Payer: Self-pay | Admitting: Cardiovascular Disease

## 2009-08-10 ENCOUNTER — Inpatient Hospital Stay (HOSPITAL_BASED_OUTPATIENT_CLINIC_OR_DEPARTMENT_OTHER): Admission: RE | Admit: 2009-08-10 | Discharge: 2009-08-10 | Payer: Self-pay | Admitting: Cardiovascular Disease

## 2009-08-10 ENCOUNTER — Ambulatory Visit: Payer: Self-pay | Admitting: Cardiovascular Disease

## 2009-08-26 ENCOUNTER — Emergency Department (HOSPITAL_COMMUNITY): Admission: EM | Admit: 2009-08-26 | Discharge: 2009-08-26 | Payer: Self-pay | Admitting: Emergency Medicine

## 2009-08-26 ENCOUNTER — Encounter (INDEPENDENT_AMBULATORY_CARE_PROVIDER_SITE_OTHER): Payer: Self-pay | Admitting: Emergency Medicine

## 2009-08-26 ENCOUNTER — Telehealth: Payer: Self-pay | Admitting: Nurse Practitioner

## 2009-08-26 ENCOUNTER — Ambulatory Visit: Payer: Self-pay | Admitting: Vascular Surgery

## 2009-08-30 ENCOUNTER — Telehealth (INDEPENDENT_AMBULATORY_CARE_PROVIDER_SITE_OTHER): Payer: Self-pay | Admitting: *Deleted

## 2009-08-31 ENCOUNTER — Ambulatory Visit: Payer: Self-pay

## 2009-08-31 ENCOUNTER — Encounter (HOSPITAL_COMMUNITY): Admission: RE | Admit: 2009-08-31 | Discharge: 2009-11-07 | Payer: Self-pay | Admitting: Cardiovascular Disease

## 2009-08-31 ENCOUNTER — Encounter: Payer: Self-pay | Admitting: Cardiovascular Disease

## 2009-08-31 ENCOUNTER — Ambulatory Visit: Payer: Self-pay | Admitting: Cardiovascular Disease

## 2009-09-03 HISTORY — PX: EYE SURGERY: SHX253

## 2009-09-07 ENCOUNTER — Ambulatory Visit: Payer: Self-pay | Admitting: Pulmonary Disease

## 2009-09-08 ENCOUNTER — Ambulatory Visit: Payer: Self-pay | Admitting: Cardiovascular Disease

## 2009-09-12 ENCOUNTER — Telehealth (INDEPENDENT_AMBULATORY_CARE_PROVIDER_SITE_OTHER): Payer: Self-pay | Admitting: *Deleted

## 2009-09-13 ENCOUNTER — Encounter: Payer: Self-pay | Admitting: Pulmonary Disease

## 2009-09-22 ENCOUNTER — Ambulatory Visit: Payer: Self-pay | Admitting: Cardiovascular Disease

## 2009-09-23 ENCOUNTER — Encounter: Payer: Self-pay | Admitting: Pulmonary Disease

## 2009-09-27 LAB — CONVERTED CEMR LAB
BUN: 9 mg/dL (ref 6–23)
CO2: 35 meq/L — ABNORMAL HIGH (ref 19–32)
Calcium: 8.9 mg/dL (ref 8.4–10.5)
Chloride: 97 meq/L (ref 96–112)
Creatinine, Ser: 0.6 mg/dL (ref 0.4–1.2)
GFR calc non Af Amer: 105.08 mL/min (ref >60)
Glucose, Bld: 158 mg/dL — ABNORMAL HIGH (ref 70–99)
Potassium: 3.7 meq/L (ref 3.5–5.1)
Sodium: 139 meq/L (ref 135–145)

## 2009-12-05 ENCOUNTER — Telehealth (INDEPENDENT_AMBULATORY_CARE_PROVIDER_SITE_OTHER): Payer: Self-pay | Admitting: *Deleted

## 2009-12-06 ENCOUNTER — Ambulatory Visit: Payer: Self-pay | Admitting: Pulmonary Disease

## 2009-12-06 DIAGNOSIS — J209 Acute bronchitis, unspecified: Secondary | ICD-10-CM | POA: Insufficient documentation

## 2009-12-16 ENCOUNTER — Encounter: Admission: RE | Admit: 2009-12-16 | Discharge: 2009-12-16 | Payer: Self-pay | Admitting: Obstetrics and Gynecology

## 2010-01-05 ENCOUNTER — Ambulatory Visit: Payer: Self-pay | Admitting: Pulmonary Disease

## 2010-01-18 ENCOUNTER — Encounter: Payer: Self-pay | Admitting: Pulmonary Disease

## 2010-02-20 ENCOUNTER — Encounter: Payer: Self-pay | Admitting: Cardiovascular Disease

## 2010-02-20 ENCOUNTER — Telehealth (INDEPENDENT_AMBULATORY_CARE_PROVIDER_SITE_OTHER): Payer: Self-pay | Admitting: *Deleted

## 2010-02-21 ENCOUNTER — Encounter: Payer: Self-pay | Admitting: Pulmonary Disease

## 2010-03-13 ENCOUNTER — Telehealth: Payer: Self-pay | Admitting: Cardiovascular Disease

## 2010-03-24 ENCOUNTER — Encounter: Payer: Self-pay | Admitting: Pulmonary Disease

## 2010-04-20 ENCOUNTER — Ambulatory Visit: Payer: Self-pay | Admitting: Pulmonary Disease

## 2010-04-22 LAB — CONVERTED CEMR LAB
ALT: 29 units/L (ref 0–35)
AST: 25 units/L (ref 0–37)
Albumin: 4 g/dL (ref 3.5–5.2)
Alkaline Phosphatase: 71 units/L (ref 39–117)
BUN: 7 mg/dL (ref 6–23)
Basophils Absolute: 0.1 10*3/uL (ref 0.0–0.1)
Basophils Relative: 0.9 % (ref 0.0–3.0)
Bilirubin, Direct: 0.2 mg/dL (ref 0.0–0.3)
CO2: 33 meq/L — ABNORMAL HIGH (ref 19–32)
Calcium: 9 mg/dL (ref 8.4–10.5)
Chloride: 94 meq/L — ABNORMAL LOW (ref 96–112)
Cholesterol: 118 mg/dL (ref 0–200)
Creatinine, Ser: 0.7 mg/dL (ref 0.4–1.2)
Direct LDL: 45.2 mg/dL
Eosinophils Absolute: 0.2 10*3/uL (ref 0.0–0.7)
Eosinophils Relative: 3 % (ref 0.0–5.0)
GFR calc non Af Amer: 87.81 mL/min (ref >60)
Glucose, Bld: 137 mg/dL — ABNORMAL HIGH (ref 70–99)
HCT: 40.2 % (ref 36.0–46.0)
HDL: 31.5 mg/dL — ABNORMAL LOW (ref >39.00)
Hemoglobin: 13.9 g/dL (ref 12.0–15.0)
Hgb A1c MFr Bld: 7.1 % — ABNORMAL HIGH (ref 4.6–6.5)
Lymphocytes Relative: 39.6 % (ref 12.0–46.0)
Lymphs Abs: 3 10*3/uL (ref 0.7–4.0)
MCHC: 34.5 g/dL (ref 30.0–36.0)
MCV: 96.5 fL (ref 78.0–100.0)
Monocytes Absolute: 0.6 10*3/uL (ref 0.1–1.0)
Monocytes Relative: 8.3 % (ref 3.0–12.0)
Neutro Abs: 3.7 10*3/uL (ref 1.4–7.7)
Neutrophils Relative %: 48.2 % (ref 43.0–77.0)
Platelets: 291 10*3/uL (ref 150.0–400.0)
Potassium: 4.7 meq/L (ref 3.5–5.1)
RBC: 4.16 M/uL (ref 3.87–5.11)
RDW: 13.3 % (ref 11.5–14.6)
Sodium: 133 meq/L — ABNORMAL LOW (ref 135–145)
TSH: 2.28 microintl units/mL (ref 0.35–5.50)
Total Bilirubin: 0.5 mg/dL (ref 0.3–1.2)
Total CHOL/HDL Ratio: 4
Total Protein: 6.6 g/dL (ref 6.0–8.3)
Triglycerides: 332 mg/dL — ABNORMAL HIGH (ref 0.0–149.0)
VLDL: 66.4 mg/dL — ABNORMAL HIGH (ref 0.0–40.0)
WBC: 7.6 10*3/uL (ref 4.5–10.5)

## 2010-05-15 ENCOUNTER — Ambulatory Visit: Payer: Self-pay | Admitting: Cardiovascular Disease

## 2010-05-15 ENCOUNTER — Ambulatory Visit: Payer: Self-pay | Admitting: Pulmonary Disease

## 2010-07-14 ENCOUNTER — Telehealth (INDEPENDENT_AMBULATORY_CARE_PROVIDER_SITE_OTHER): Payer: Self-pay | Admitting: *Deleted

## 2010-07-19 ENCOUNTER — Encounter: Payer: Self-pay | Admitting: Pulmonary Disease

## 2010-08-15 ENCOUNTER — Telehealth (INDEPENDENT_AMBULATORY_CARE_PROVIDER_SITE_OTHER): Payer: Self-pay | Admitting: *Deleted

## 2010-08-22 ENCOUNTER — Telehealth: Payer: Self-pay | Admitting: Pulmonary Disease

## 2010-09-03 HISTORY — PX: JOINT REPLACEMENT: SHX530

## 2010-09-23 ENCOUNTER — Encounter: Payer: Self-pay | Admitting: Obstetrics and Gynecology

## 2010-09-24 ENCOUNTER — Encounter: Payer: Self-pay | Admitting: Obstetrics and Gynecology

## 2010-09-25 ENCOUNTER — Telehealth: Payer: Self-pay | Admitting: Pulmonary Disease

## 2010-09-28 ENCOUNTER — Encounter: Payer: Self-pay | Admitting: Cardiovascular Disease

## 2010-09-28 ENCOUNTER — Ambulatory Visit
Admission: RE | Admit: 2010-09-28 | Discharge: 2010-09-28 | Payer: Self-pay | Source: Home / Self Care | Attending: Cardiovascular Disease | Admitting: Cardiovascular Disease

## 2010-09-29 ENCOUNTER — Telehealth: Payer: Self-pay | Admitting: Pulmonary Disease

## 2010-10-01 LAB — CONVERTED CEMR LAB
ALT: 32 units/L (ref 0–35)
AST: 26 units/L (ref 0–37)
Albumin: 3.9 g/dL (ref 3.5–5.2)
Alkaline Phosphatase: 67 units/L (ref 39–117)
Bilirubin, Direct: 0.1 mg/dL (ref 0.0–0.3)
Cholesterol: 147 mg/dL (ref 0–200)
Direct LDL: 73.3 mg/dL
HDL: 42.2 mg/dL (ref >39.00)
Total Bilirubin: 1 mg/dL (ref 0.3–1.2)
Total CHOL/HDL Ratio: 3
Total Protein: 7 g/dL (ref 6.0–8.3)
Triglycerides: 308 mg/dL — ABNORMAL HIGH (ref 0.0–149.0)
VLDL: 61.6 mg/dL — ABNORMAL HIGH (ref 0.0–40.0)

## 2010-10-05 NOTE — Medication Information (Signed)
Summary: Pantoprazole/[facility]  Pantoprazole/[facility]   Imported By: Sherian Rein 09/29/2009 10:18:40  _____________________________________________________________________  External Attachment:    Type:   Image     Comment:   External Document

## 2010-10-05 NOTE — Progress Notes (Signed)
Summary: generic rx-LMOMTCB x 1  Phone Note Call from Patient Call back at Home Phone [phone]   Caller: Patient Call For: nadel Reason for Call: Talk to Nurse Summary of Call: Patient stopped by wanting generic rx for list they dropped off.  Call patient with questions. Initial call taken by: Lehman Prom,  August 15, 2010 2:21 PM  Follow-up for Phone Call        ? What pharm, 90 or 30 day supply?? LMOMTCB Vernie Murders  August 15, 2010 2:38 PM Spoke with pt.  She is requesting that all rxs be refilled for 30 days supply to [facility] (a list was given).  I advised will refill all meds and the ones that are not already generic will be changed to generic.  Pt verbalized understanding. Follow-up by: Vernie Murders,  August 15, 2010 3:09 PM    New/Updated Medications: OMEPRAZOLE 20 MG CPDR (OMEPRAZOLE) 2 once daily Prescriptions: EFFEXOR 37.5 MG TABS (VENLAFAXINE HCL) take 1 tab by mouth once daily...  #30 x 11   Entered by:   Vernie Murders   Authorized by:   Michele Mcalpine MD   Signed by:   Vernie Murders on 08/15/2010   Method used:   Telephoned to ...       [facility]       [address]       [place], [place]  [address]       Ph: [phone]       Fax: [phone]   RxID:   [id number] LYRICA 75 MG  CAPS (PREGABALIN) Take 1 capsule by mouth two times a day  #60 x 5   Entered by:   Vernie Murders   Authorized by:   Michele Mcalpine MD   Signed by:   Vernie Murders on 08/15/2010   Method used:   Telephoned to ...       [facility]       [address]       [place], [place]  [address]       Ph: [phone]       Fax: [phone]   RxID:   [id number] CRESTOR 20 MG  TABS (ROSUVASTATIN CALCIUM) take 1 tab by mouth at bedtime...  #30 x 11   Entered by:   Vernie Murders   Authorized by:   Michele Mcalpine MD   Signed by:   Vernie Murders on 08/15/2010   Method used:   Telephoned to  ...       [facility]       [address]       [place], [place]  [address]       Ph: [phone]       Fax: [phone]   RxID:   [id number] LASIX 20 MG  TABS (FUROSEMIDE) 1 by mouth once daily  #30 x 11   Entered by:   Vernie Murders   Authorized by:   Michele Mcalpine MD   Signed by:   Vernie Murders on 08/15/2010   Method used:   [facility]phoned to ...       [facility]       [address]       [place], [place]  [address]  Ph: [phone]       Fax: [phone]   RxID:   [id number] COZAAR 100 MG TABS (LOSARTAN POTASSIUM) Take one tablet by mouth daily  #30 x 11   Entered by:   Vernie Murders   Authorized by:   Michele Mcalpine MD   Signed by:   Vernie Murders on 08/15/2010   Method used:   Telephoned to ...       [facility]       [address]       [place], [place]  [address]       Ph: [phone]       Fax: [phone]   RxID:   [id number] METHOCARBAMOL 500 MG TABS (METHOCARBAMOL) take 1 tablet by mouth every 6 hours as needed for muscle spasms  #60 x 5   Entered by:   Vernie Murders   Authorized by:   Michele Mcalpine MD   Signed by:   Vernie Murders on 08/15/2010   Method used:   Telephoned to ...       [facility]       [address]       [place], [place]  [address]       Ph: [phone]       Fax: [phone]   RxID:   [id number] PLAVIX 75 MG  TABS (CLOPIDOGREL BISULFATE) 1 by mouth once daily  #30 x 11   Entered by:   Vernie Murders   Authorized by:   Michele Mcalpine MD   Signed by:   Vernie Murders on 08/15/2010   Method used:   Telephoned to ...       [facility]       [address]       [place], [place]  [address]       Ph: [phone]       Fax: [phone]   RxID:   [id number] ARTHROTEC 75-200 MG-MCG TABS (DICLOFENAC-MISOPROSTOL) take one  tablet by mouth two times a day  #60 x 11   Entered by:   Vernie Murders   Authorized by:   Michele Mcalpine MD   Signed by:   Vernie Murders on 08/15/2010   Method used:   Telephoned to ...       [facility]       [address]       [place], [place]  [address]       Ph: [phone]       Fax: [phone]   RxID:   [id number] METFORMIN HCL 500 MG  TABS (METFORMIN HCL) 1/2 by mouth two times a day  #30 x 11   Entered by:   Vernie Murders   Authorized by:   Michele Mcalpine MD   Signed by:   Vernie Murders on 08/15/2010   Method used:   Telephoned to ...       [facility]       [address]       [place], [place]  [address]       Ph: [phone]       Fax: [phone]   RxID:   [id number] BENTYL 10  MG CAPS (DICYCLOMINE HCL) take one capsule by mouth two times a day  #60 x 0   Entered by:   Vernie Murders   Authorized by:   Michele Mcalpine MD   Signed by:   Vernie Murders on 08/15/2010   Method used:   Telephoned to ...       [facility]       [address]       [place], [place]  [address]       Ph: [phone]       Fax: [phone]   RxID:   [id number] OMEPRAZOLE 20 MG CPDR (OMEPRAZOLE) 2 once daily  #60 x 11   Entered by:   Vernie Murders   Authorized by:   Michele Mcalpine MD   Signed by:   Vernie Murders on 08/15/2010   Method used:   Telephoned to ...       [facility]       [address]       [place], [place]  [address]       Ph: [phone]       Fax: [phone]   RxID:   [id number]

## 2010-10-05 NOTE — Letter (Signed)
Summary: [facility]  [facility]   Imported By: Sherian Rein 10/14/2009 09:56:02  _____________________________________________________________________  External Attachment:    Type:   Image     Comment:   External Document

## 2010-10-05 NOTE — Progress Notes (Signed)
Summary: transfer prescriptions  Phone Note Call from Patient Call back at Home Phone [phone]   Caller: Spouse//gene Call For: [name] [name] Summary of Call: States that pt '[facility] and the pharmacy hasn't received the transfer pls advise. Initial call taken by: Darletta Moll,  August 22, 2010 10:48 AM  Follow-up for Phone Call        called and spoke with pharmacy at [facility] and they stated that ony one script was called in on 12-13----i gave all the rx over the phone to the pharmacy and they will fill these meds for the pt---pt and her husband are aware [address] [facility]  August 22, 2010 11:23 AM

## 2010-10-05 NOTE — Progress Notes (Signed)
Summary: sooner appt.  Phone Note Call from Patient Call back at Home Phone [phone]   Caller: Patient Call For: Riely Baskett Reason for Call: Talk to Nurse Summary of Call: Patient is needing total knee replacement.  She is calling saying she needs a sooner appt. w/ Dr. Kriste Basque, her appt currently is scheduled for 2/20 @ 10:00. Initial call taken by: Lehman Prom,  September 25, 2010 10:44 AM  Follow-up for Phone Call        Spoke with patient and she states she needs an appt for surgical clearance. her surgery is not set yet but she does not want to wait until Feb. Please advise on a sooner appt with SN. Thanks.Carron Curie CMA  September 25, 2010 2:01 PM   Additional Follow-up for Phone Call Additional follow up Details #1::        appt made for pt on 2-6 at 3:30pm---pt is aware of appt made [facility] [address] [facility]  September 25, 2010 5:17 PM

## 2010-10-05 NOTE — Progress Notes (Signed)
Summary: rrefill  Phone Note Call from Patient   Caller: spouse/Dolly Call For: dr Kriste Basque Summary of Call: Mr. Schweickert phoned stated that his wife needs a refill on her Dicyclomine 10 mg. Stated that [facility] on [address] told them that they faxed this twice and they have not heard back. Patient can be reached at [phone] Initial call taken by: Vedia Coffer,  September 29, 2010 10:08 AM  Follow-up for Phone Call        called and spoke with pts husband dolly and he is aware of meds being sent to the pharmacy per pts request Randell [address] CMA  September 29, 2010 10:19 AM     Prescriptions: DICYCLOMINE HCL 10 MG CAPS (DICYCLOMINE HCL) Take 1 tablet by mouth two times a day  #60 x 5   Entered by:   Randell Loop CMA   Authorized by:   Michele Mcalpine MD   Signed by:   Randell Loop CMA on 09/29/2010   Method used:   Electronically to        [facility] ([facility]ail)       [address]       [place], [place]  [address]       Ph: [phone]       Fax: [phone]   RxID:   [id number]

## 2010-10-05 NOTE — Assessment & Plan Note (Signed)
Summary: chest cong, cough/apc   Primary Provider/Referring Provider:  [name], MD  CC:  Acute Visit.  chest congestion, non prod cough, wheezing, rattling in chest, and and low grade fever since Saturday night.  "raw throat" since Sunday and sweats since this morning.  Also states "ribs and chest are hurting from coughing so much.".  History of Present Illness: 70 year old female patient of Dr Kriste Basque with known history of HTN, CAD, hyperlipidemia, DM, GERD and chronic dizziness   December 06, 2009--Acute Visit.  Complains of chest congestion, non prod cough, wheezing, rattling in chest, and low grade fever  4 days.  Also states "ribs and chest are hurting from coughing so much." OTC not helping . Husband had similar symptoms which he was tx for bronchitis. Had recent UTI tx w/ Cipro.  Denies chest pain, dyspnea, orthopnea, hemoptysis, fever, n/v/d, edema, headache.   Current Medications (verified): 1)  Aspirin 81 Mg  Tbec (Aspirin) .... Take 1 Tab By Mouth Once Daily.[facility] 2)  Plavix 75 Mg  Tabs (Clopidogrel Bisulfate) .Marland Kitchen.. 1 By Mouth Once Daily 3)  Nitrostat 0.4 Mg  Subl (Nitroglycerin) .... Use As Directed For Cp... 4)  Metoprolol Tartrate 100 Mg  Tabs (Metoprolol Tartrate) .... Take 1 Tab By Mouth Two Times A Day... 5)  Lasix 20 Mg  Tabs (Furosemide) .Marland Kitchen.. 1 By Mouth Once Daily 6)  Crestor 20 Mg  Tabs (Rosuvastatin Calcium) .... Take 1 Tab By Mouth At Bedtime.[facility] 7)  Niacin Cr 500 Mg  Cpcr (Niacin) .... Take 2 Tabs By Mouth At Bedtime.[facility] 8)  Fish Oil Concentrate 1000 Mg Caps (Omega-3 Fatty Acids) .... Take As Directed By [facility] 9)  Coq10 100 Mg Caps (Coenzyme Q10) .... Take 1 Capsule By Mouth Once A Day 10)  Metformin Hcl 500 Mg  Tabs (Metformin Hcl) .... 1/2 By Mouth Two Times A Day 11)  Bentyl 10 Mg Caps (Dicyclomine Hcl) .... Take One Capsule By Mouth Two Times A Day 12)  Flaxseed Oil 1000 Mg  Caps (Flaxseed (Linseed)) .Marland Kitchen.. 1 By Mouth Once Daily 13)  Lyrica 75 Mg  Caps  (Pregabalin) .... Take 1 Capsule By Mouth Three Times A Day 14)  Caltrate 600+d 600-400 Mg-Unit Tabs (Calcium Carbonate-Vitamin D) .... Take 1 Tab By Mouth Two Times A Day... 15)  Glucosamine 500 Mg Caps (Glucosamine Sulfate) .... Take 1 Capsule By Mouth Two Times A Day 16)  Multivitamins   Tabs (Multiple Vitamin) .Marland Kitchen.. 1 Tab Once Daily 17)  Vitamin C 500 Mg  Tabs (Ascorbic Acid) .... 2 Tabs Qd 18)  [facility] Vitamin D 1000 Unit Caps (Cholecalciferol) .Marland Kitchen.. 1 Daily. 19)  Cinnamon 500 Mg Caps (Cinnamon) .... Take 1 Capsule By Mouth Two Times A Day 20)  Garlic   Powd (Garlic) .... 500mg  2 Tabs Once Daily 21)  Effexor 37.5 Mg Tabs (Venlafaxine Hcl) .... Take 1 Tab By Mouth Once Daily... 22)  Meclizine Hcl 25 Mg  Tabs (Meclizine Hcl) .Marland Kitchen.. 1 Tab Every 4-6hrs Prn Dizziness 23)  Prilosec Otc 20 Mg Tbec (Omeprazole Magnesium) .... Take 1 Tablet By Mouth Two Times A Day 24)  Bilberry 100 Mg Caps (Bilberry (Vaccinium Myrtillus)) .... Take 1 Capsule By Mouth Two Times A Day 25)  Aloe Vera 470 Mg Caps (Aloe Vera) .... Take 1 Capsule By Mouth Two Times A Day 26)  Cozaar 50 Mg Tabs (Losartan Potassium) .... Take One Tablet By Mouth Daily  Allergies (verified): 1)  ! 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) CEREBROVASCULAR DISEASE (ICD-437.9) MIXED HYPERLIPIDEMIA (ICD-272.2) DIABETES MELLITUS, TYPE II (ICD-250.00) GASTROESOPHAGEAL REFLUX DISEASE (ICD-530.81) DIVERTICULAR DISEASE (ICD-562.10) IBS (ICD-564.1) URINARY INCONTINENCE (ICD-788.30) DEGENERATIVE JOINT DISEASE (ICD-715.90) NECK PAIN, CHRONIC (ICD-723.1) SYNDROME, CHRONIC PAIN (ICD-338.4) DIZZINESS, CHRONIC (ICD-780.4) ANXIETY (ICD-300.00)  Past Surgical History: Last updated: 09/07/2009 Cholecystectomy CSpine Surg 11/06 by  Carles Collet Bladder Repair Hysterectomy Tonsillectomy Thumb surgery Neck surgery  temporomandibular joint surgery.  Family History: Last updated: 08/03/2009  Mother with some form of heart ailments.  The patient is  unsure what.  The patient states that she has never known her father.  She has two siblings with known coronary artery disease.  Social History: Last updated: 08/03/2009 Patient has never smoked.  Alcohol Use - no Daily Caffeine Use Illicit Drug Use - no Married  Risk Factors: Caffeine Use: 1 (03/11/2008) Exercise: yes (03/11/2008)  Risk Factors: Smoking Status: never (05/13/2008)  Review of Systems      See HPI  Vital Signs:  Patient profile:   70 year old female Height:      61 inches Weight:      151.50 pounds BMI:     28.73 O2 Sat:      95 % on Room air Temp:     98.9 degrees F oral Pulse rate:   76 / minute BP sitting:   152 / 94  (left arm) Cuff size:   regular  Vitals Entered By: Gweneth Dimitri RN (December 06, 2009 10:00 AM)  O2 Flow:  Room air CC: Acute Visit.  chest congestion, non prod cough, wheezing, rattling in chest, and low grade fever since Saturday night.  "raw throat" since Sunday and sweats since this morning.  Also states "ribs and chest are hurting from coughing so much." Is Patient Diabetic? Yes Comments Medications reviewed with patient Daytime contact number verified with patient. Gweneth Dimitri RN  December 06, 2009 9:58 AM    Physical Exam  Additional Exam:  WD, WN, 69y/o WF in NAD... GENERAL:  Alert & oriented; pleasant & cooperative... HEENT:  [place]/AT, EOM-wnl, PERRLA, EACs-clear, TMs-wnl, NOSE-clear, THROAT-clear & wnl. NECK:  Supple w/ decrROM & scars from surg; no JVD; normal carotid impulses w/o bruits;  no thyromegaly or nodules palpated; no lymphadenopathy. CHEST:  Clear to P & A; without wheezes/ rales/ or rhonchi heard... HEART:  Regular Rhythm; without murmurs/ rubs/ or gallops detected... ABDOMEN:  Soft & nontender;  normal bowel sounds; no organomegaly or masses palpated... EXT: without deformities, mod arthritic changes w/ knee crepitus; no varicose veins/ +venous insuffic/ tr edema. NEURO:  CN's intact; no focal neuro deficits... DERM:  No lesions noted; no rash etc...     Impression & Recommendations:  Problem # 1:  BRONCHITIS, ACUTE (ICD-466.0)  Hold fish oil/flax seed oil until cough is gone, then resume  Augmentin 875mg  two times a day for 7days  Mucinex DM two times a day as needed cough/congestion  Hydromet 1-2 tsp every 4-6 hr as needed cough, may make you sleepy . Increase fluids. Alternate tylenol/advil as needed fever.  Please contact office for sooner follow up if symptoms do not improve or worsen   Orders: Est. Patient Level IV (213[address])  Her updated medication list for this problem includes:    Augmentin 875-125 Mg Tabs (Amoxicillin-pot clavulanate) .Marland Kitchen... 1 by mouth two times a day  Hydromet 5-1.5 Mg/62ml Syrp (Hydrocodone-homatropine) .Marland Kitchen... 1-2 tsp every 4-6 hr as needed cough , congesiton  Medications Added to Medication List This Visit: 1)  Lyrica 75 Mg Caps (Pregabalin) .... Take 1 capsule by mouth three times a day 2)  Augmentin 875-125 Mg Tabs (Amoxicillin-pot clavulanate) .Marland Kitchen.. 1 by mouth two times a day 3)  Hydromet 5-1.5 Mg/63ml Syrp (Hydrocodone-homatropine) .Marland Kitchen.. 1-2 tsp every 4-6 hr as needed cough , congesiton  Complete Medication List: 1)  Aspirin 81 Mg Tbec (Aspirin) .... Take 1 tab by mouth once daily.Marland KitchenMarland Kitchen 2)  Plavix 75 Mg Tabs (Clopidogrel bisulfate) .Marland Kitchen.. 1 by mouth once daily 3)  Nitrostat 0.4 Mg Subl (Nitroglycerin) .... Use as directed for cp... 4)  Metoprolol Tartrate 100 Mg Tabs (Metoprolol tartrate) .... Take 1 tab by mouth two times a day... 5)  Lasix 20 Mg Tabs (Furosemide) .Marland Kitchen.. 1 by mouth once daily 6)  Crestor 20 Mg Tabs (Rosuvastatin calcium) .... Take 1 tab by mouth at bedtime.Marland KitchenMarland Kitchen 7)  Niacin Cr 500 Mg Cpcr (Niacin) .... Take 2 tabs by mouth at  bedtime.Marland KitchenMarland Kitchen 8)  Fish Oil Concentrate 1000 Mg Caps (Omega-3 fatty acids) .... Take as directed by lipid clinic.[facility]land [facility] 9)  Coq10 100 Mg Caps (Coenzyme q10) .... Take 1 capsule by mouth once a day 10)  Metformin Hcl 500 Mg Tabs (Metformin hcl) .... 1/2 by mouth two times a day 11)  Bentyl 10 Mg Caps (Dicyclomine hcl) .... Take one capsule by mouth two times a day 12)  Flaxseed Oil 1000 Mg Caps (Flaxseed (linseed)) .Marland Kitchen.. 1 by mouth once daily 13)  Lyrica 75 Mg Caps (Pregabalin) .... Take 1 capsule by mouth three times a day 14)  Caltrate 600+d 600-400 Mg-unit Tabs (Calcium carbonate-vitamin d) .... Take 1 tab by mouth two times a day... 15)  Glucosamine 500 Mg Caps (Glucosamine sulfate) .... Take 1 capsule by mouth two times a day 16)  Multivitamins Tabs (Multiple vitamin) .Marland Kitchen.. 1 tab once daily 17)  Vitamin C 500 Mg Tabs (Ascorbic acid) .... 2 tabs qd 18)  [facility] Vitamin D 1000 Unit Caps (Cholecalciferol) .Marland Kitchen.. 1 daily. 19)  Cinnamon 500 Mg Caps (Cinnamon) .... Take 1 capsule by mouth two times a day 20)  Garlic Powd (Garlic) .... 500mg  2 tabs once daily 21)  Effexor 37.5 Mg Tabs (Venlafaxine hcl) .... Take 1 tab by mouth once daily... 22)  Meclizine Hcl 25 Mg Tabs (Meclizine hcl) .Marland Kitchen.. 1 tab every 4-6hrs prn dizziness 23)  Prilosec Otc 20 Mg Tbec (Omeprazole magnesium) .... Take 1 tablet by mouth two times a day 24)  Bilberry 100 Mg Caps (Bilberry (vaccinium myrtillus)) .... Take 1 capsule by mouth two times a day 25)  Aloe Vera 470 Mg Caps (Aloe vera) .... Take 1 capsule by mouth two times a day 26)  Cozaar 50 Mg Tabs (Losartan potassium) .... Take one tablet by mouth daily 27)  Augmentin 875-125 Mg Tabs (Amoxicillin-pot clavulanate) .Marland Kitchen.. 1 by mouth two times a day 28)  Hydromet 5-1.5 Mg/107ml Syrp (Hydrocodone-homatropine) .Marland Kitchen.. 1-2 tsp every 4-6 hr as needed cough , congesiton  Patient Instructions: 1)  Hold fish oil/flax seed oil until cough is gone, then resume  2)  Augmentin 875mg  two times a day for  7days  3)  Mucinex DM two times a day as needed cough/congestion  4)  Hydromet 1-2 tsp every 4-6 hr as needed cough, may make you sleepy . 5)  Increase fluids. Alternate tylenol/advil as needed fever.  6)  Please contact office  for sooner follow up if symptoms do not improve or worsen  Prescriptions: HYDROMET 5-1.5 MG/5ML SYRP (HYDROCODONE-HOMATROPINE) 1-2 tsp every 4-6 hr as needed cough , congesiton  #8 oz x 0   Entered and Authorized by:   Rubye Oaks NP   Signed by:   Harsha Yusko NP on 12/06/2009   Method used:   Print then Give to Patient   RxID:   [id number] AUGMENTIN 875-125 MG TABS (AMOXICILLIN-POT CLAVULANATE) 1 by mouth two times a day  #14 x 0   Entered and Authorized by:   Rubye Oaks NP   Signed by:   Taryn Nave NP on 12/06/2009   Method used:   [facility]  [address] [id number]       [address]       [place], [place]  [address]       Ph: [phone] or [phone]       Fax: [phone]   RxID:   [id number]

## 2010-10-05 NOTE — Progress Notes (Signed)
  Walk in Patient Form Recieved " Pt left paper here to be completed from [facility]" Date of surgery June 27th..I recieved this paper today it was put in paperwork box by the front desk. not given to Me.... Cala Bradford Mesiemore  February 20, 2010 11:24 AM

## 2010-10-05 NOTE — Progress Notes (Signed)
Summary: clearance for biofeedback  Phone Note From Other Clinic Call back at Home Phone [phone]   Caller: Ruben Gottron Summary of Call: Perianal biofeedback clearance. needs note ok from cardiac standpoint to do this procedure. ofc [id number] [phone] fax [phone] Initial call taken by: Edman Circle,  March 13, 2010 9:39 AM  Follow-up for Phone Call        [facility]--perianal biofeedback uses external sensors around the anal sphincter to test muscle activity in the pelvic floor.  Used to help treat incontinence.  This is scheduled for July 28th.  The procedure does not deliver any electrial activity to the pt.  (Similar to our office performing an EKG) They are calling for approval of this procedure because they list arrhythmia as a contraindication. I will forward this information to Dr Excell Seltzer for review.  Follow-up by: Julieta Gutting, RN, BSN,  March 13, 2010 2:08 PM  Additional Follow-up for Phone Call Additional follow up Details #1::        this is ok Additional Follow-up by: Norva Karvonen, MD,  March 15, 2010 7:07 AM    Additional Follow-up for Phone Call Additional follow up Details #2::    I left a message for Amalia Greenhouse that the pt is okay for biofeedback.  Note faxed.  Follow-up by: Julieta Gutting, RN, BSN,  March 15, 2010 8:53 AM

## 2010-10-05 NOTE — Letter (Signed)
Summary: [facility]  [facility]   Imported By: Sherian Rein 02/09/2010 09:12:01  _____________________________________________________________________  External Attachment:    Type:   Image     Comment:   External Document

## 2010-10-05 NOTE — Assessment & Plan Note (Signed)
Summary: ROV   Visit Type:  Follow-up Primary Provider:  [name], MD  CC:  Palpitations sometimes.  History of Present Illness: This is a 70 year old woman with coronary artery disease who had an inferior wall MI in 2007 and was treated with the drug-eluting stent to the right coronary artery.  Followup cath was performed to evaluate recurrent chest pain. This showed a patent RCA stent and stable, moderate LAD stenosis. Follow-up moview stress testing was done to rule out anterior ischemia and the study was negative.  The patient is doing well at present. She denies chest pain, dyspnea, or edema.    Current Medications (verified): 1)  Advair Diskus 100-50 Mcg/dose Aepb (Fluticasone-Salmeterol) .Marland Kitchen.. 1 Inhalation Two Times A Day As Directed... 2)  Aspirin 81 Mg  Tbec (Aspirin) .... Take 1 Tab By Mouth Once Daily.Marland KitchenMarland Kitchen 3)  Plavix 75 Mg  Tabs (Clopidogrel Bisulfate) .Marland Kitchen.. 1 By Mouth Once Daily 4)  Nitrostat 0.4 Mg  Subl (Nitroglycerin) .... Use As Directed For Cp... 5)  Metoprolol Tartrate 100 Mg  Tabs (Metoprolol Tartrate) .... Take 1 Tab By Mouth Two Times A Day... 6)  Cozaar 50 Mg Tabs (Losartan Potassium) .... Take One Tablet By Mouth Daily 7)  Lasix 20 Mg  Tabs (Furosemide) .Marland Kitchen.. 1 By Mouth Once Daily 8)  Crestor 20 Mg  Tabs (Rosuvastatin Calcium) .... Take 1 Tab By Mouth At Bedtime.Marland KitchenMarland Kitchen 9)  Niacin Cr 500 Mg  Cpcr (Niacin) .... Take 2 Tabs By Mouth At Bedtime... 10)  Fish Oil Concentrate 1000 Mg Caps (Omega-3 Fatty Acids) .... Take As Directed By [facility] Clinic...4 Daily 11)  Coq10 100 Mg Caps (Coenzyme Q10) .... Take 1 Capsule By Mouth Once A Day 12)  Metformin Hcl 500 Mg  Tabs (Metformin Hcl) .... 1/2 By Mouth Two Times A Day 13)  Prilosec Otc 20 Mg Tbec (Omeprazole Magnesium) .... Take 1 Tablet By Mouth Two Times A Day 14)  Bentyl 10 Mg Caps (Dicyclomine Hcl) .... Take One Capsule By Mouth Two Times A Day 15)  Flaxseed Oil 1000 Mg  Caps (Flaxseed (Linseed)) .Marland Kitchen.. 1 By Mouth Two Times A  Day 16)  Cod Liver Oil  Oil (Cod Liver Oil) .... Take 2 Daily 17)  Arthrotec 75-200 Mg-Mcg Tabs (Diclofenac-Misoprostol) .... Take One Tablet By Mouth Two Times A Day 18)  Methocarbamol 500 Mg Tabs (Methocarbamol) .... Take 1 Tablet By Mouth Every 6 Hours As Needed For Muscle Spasms 19)  Lyrica 75 Mg  Caps (Pregabalin) .... Take 1 Capsule By Mouth Two Times A Day 20)  Caltrate 600+d 600-400 Mg-Unit Tabs (Calcium Carbonate-Vitamin D) .... Take 1 Tab By Mouth Two Times A Day... 21)  Glucosamine 500 Mg Caps (Glucosamine Sulfate) .... Take 1 Capsule By Mouth Two Times A Day 22)  Multivitamins   Tabs (Multiple Vitamin) .Marland Kitchen.. 1 Tab Once Daily 23)  Vitamin C 500 Mg  Tabs (Ascorbic Acid) .... 2 Tabs Qd 24)  Cvs Vitamin D 1000 Unit Caps (Cholecalciferol) .Marland Kitchen.. 1 Daily. 25)  Cinnamon 500 Mg Caps (Cinnamon) .... Take 1 Capsule By Mouth Two Times A Day 26)  Bilberry 100 Mg Caps (Bilberry (Vaccinium Myrtillus)) .... Take 1 Capsule By Mouth Two Times A Day 27)  Garlic   Powd (Garlic) .... 500mg  2 Tabs Once Daily 28)  Aloe Vera 470 Mg Caps (Aloe Vera) .... Take 1 Capsule By Mouth Two Times A Day 29)  Effexor 37.5 Mg Tabs (Venlafaxine Hcl) .... Take 1 Tab By Mouth Once Daily... 30)  Meclizine  Hcl 25 Mg  Tabs (Meclizine Hcl) .Marland Kitchen.. 1 Tab Every 4-6hrs Prn Dizziness 31)  Tramadol Hcl 50 Mg Tabs (Tramadol Hcl) .... 1/2 - 1 By Mouth Three Times A Day As Needed Pain  Allergies: 1)  ! Codeine 2)  Sulfa 3)  Tetracycline 4)  Prednisone  Past History:  Past medical history reviewed for relevance to current acute and chronic problems.  Past Medical History: Reviewed history from 04/20/2010 and no changes required. BRONCHITIS, ACUTE (ICD-466.0) HYPERTENSION (ICD-401.9) CORONARY ARTERY DISEASE (ICD-414.00) status post inferior wall MI 2007. PREMATURE VENTRICULAR CONTRACTIONS (ICD-427.69) CEREBROVASCULAR DISEASE (ICD-437.9) MIXED HYPERLIPIDEMIA (ICD-272.2) DIABETES MELLITUS, TYPE II (ICD-250.00) GASTROESOPHAGEAL  REFLUX DISEASE (ICD-530.81) DIVERTICULAR DISEASE (ICD-562.10) IBS (ICD-564.1) URINARY INCONTINENCE (ICD-788.30) DEGENERATIVE JOINT DISEASE (ICD-715.90) NECK PAIN, CHRONIC (ICD-723.1) SYNDROME, CHRONIC PAIN (ICD-338.4) DIZZINESS, CHRONIC (ICD-780.4) ANXIETY (ICD-300.00)  Review of Systems       Positive for knee pain, otherwise negative except as per HPI   Vital Signs:  Patient profile:   70 year old female Height:      61 inches Weight:      155.50 pounds BMI:     29.49 Pulse rate:   73 / minute Pulse rhythm:   regular Resp:     18 per minute BP sitting:   155 / 79  (left arm) Cuff size:   large  Vitals Entered By: Vikki Ports (May 15, 2010 3:29 PM)  Physical Exam  General:  Pt is alert and oriented, in no acute distress. HEENT: normal Neck: normal carotid upstrokes without bruits, JVP normal Lungs: CTA CV: RRR without murmur or gallop Abd: soft, NT, positive BS, no bruit, no organomegaly Ext: no clubbing, cyanosis, or edema. peripheral pulses 2+ and equal Skin: warm and dry without rash    EKG  Procedure date:  05/15/2010  Findings:      NSR 73 bpm, prolonged QT (QTc 489 ms), otherwise WNL  Impression & Recommendations:  Problem # 1:  CORONARY ARTERY DISEASE (ICD-414.00) Pt is stable without angina. Recommend continued medical therapy as outlined below. If she needs knee surgery she will need to hold ASA and plavix for a short period and this should be ok as she is several years out from her PCI procedure.  Her updated medication list for this problem includes:    Aspirin 81 Mg Tbec (Aspirin) .Marland Kitchen... Take 1 tab by mouth once daily...    Plavix 75 Mg Tabs (Clopidogrel bisulfate) .Marland Kitchen... 1 by mouth once daily    Nitrostat 0.4 Mg Subl (Nitroglycerin) ..... Use as directed for cp...    Metoprolol Tartrate 100 Mg Tabs (Metoprolol tartrate) .Marland Kitchen... Take 1 tab by mouth two times a day...  Problem # 2:  HYPERTENSION, UNSPECIFIED (ICD-401.9) BP above goal and  she reports BP's elevated at home as well. Recommend increase Cozaar to 100 mg daily.  Her updated medication list for this problem includes:    Aspirin 81 Mg Tbec (Aspirin) .Marland Kitchen... Take 1 tab by mouth once daily...    Metoprolol Tartrate 100 Mg Tabs (Metoprolol tartrate) .Marland Kitchen... Take 1 tab by mouth two times a day...    Cozaar 100 Mg Tabs (Losartan potassium) .Marland Kitchen... Take one tablet by mouth daily    Lasix 20 Mg Tabs (Furosemide) .Marland Kitchen... 1 by mouth once daily  BP today: 155/79 Prior BP: 130/78 (04/20/2010)  Labs Reviewed: K+: 4.7 (04/20/2010) Creat: : 0.7 (04/20/2010)   Chol: 118 (04/20/2010)   HDL: 31.50 (04/20/2010)   LDL: DEL (09/06/2008)   TG: 332.0 (04/20/2010)  Problem #  3:  MIXED HYPERLIPIDEMIA (ICD-272.2) LDL 45 at recent check. Agree with Dr Kriste Basque that hypertriglyceridemia can be treated with lefestyle/diet modification.  Her updated medication list for this problem includes:    Crestor 20 Mg Tabs (Rosuvastatin calcium) .Marland Kitchen... Take 1 tab by mouth at bedtime...    Niacin Cr 500 Mg Cpcr (Niacin) .Marland Kitchen... Take 2 tabs by mouth at bedtime...  CHOL: 118 (04/20/2010)   LDL: DEL (09/06/2008)   HDL: 31.50 (04/20/2010)   TG: 332.0 (04/20/2010) Homocysteine: 7.4 (05/13/2008)  Patient Instructions: 1)  Your physician recommends that you schedule a follow-up appointment in: 6 months 2)  Your physician has recommended you make the following change in your medication:  3)  INCREASE your Cozaar to 100mg  by mouth daily Prescriptions: COZAAR 100 MG TABS (LOSARTAN POTASSIUM) Take one tablet by mouth daily  #30 x 7   Entered by:   Whitney Maeola Sarah RN   Authorized by:   Norva Karvonen, MD   Signed by:   Ellender Hose RN on 05/15/2010   Method used:   Electronically to        [facility]  [address] [id number] ([facility])       [address]       [place], [place]  [address]       Ph: [phone] or [phone]       Fax: [phone]   RxID:   [id number]

## 2010-10-05 NOTE — Progress Notes (Signed)
Summary: cough/ congestion  Phone Note Call from Patient Call back at Home Phone [phone]   Caller: Spouse Call For: nadel Summary of Call: pt's spouse states that pt has "what spouse has had" x two days. cough w/ yellow-green phlegm. denies fever. no N or V. req rx. [facility] taken OTC [facility]raflu[facility] [address]  Initial call taken by: Tivis Ringer, CNA,  December 05, 2009 9:00 AM  Follow-up for Phone Call        called and spoke with pt.  pt c/o chest congestion, coughing up yellow sputum, fever of 99 to 100, clear nasal drainage and sore thraot.  Symptoms started 2 days ago.  Pt has taken OTC Theraflu with no relief of symptoms.  Please advise.  Thanks. Aundra Millet Reynolds LPN  December 06, 979 9:31 AM  ALLERGIES:  sulfa, tetracycline, prednisone, codeine.    Additional Follow-up for Phone Call Additional follow up Details #1::        per SN---ok for her to have augmentin 875mg   #14  1 by mouth two times a day and take tylenol, fluids and rest  thanks Randell Loop CMA  December 06, 2009 9:22 AM     Additional Follow-up for Phone Call Additional follow up Details #2::    pt made OV with Tammy this morning @ 1000.  will inform TP of SN's recs for pt's symptoms. Boone Master CNA  December 06, 2009 10:08 AM

## 2010-10-05 NOTE — Assessment & Plan Note (Signed)
Summary: swollen leg/er follow up/la   Primary Care Provider:  [name], MD  CC:  4 month ROV & post ER visit for left knee pain....  History of Present Illness: 70 y/o WF here for a follow up visit... he has multiple medical problems as noted below...    ~  in May09 her Lipitor80 was stopped due to weakness in her legs and she was referred to the [facility]... they have her on CRESTOR 20mg /d and NIACIN CR 500mg - 2 tabs at bedtime... she is tolerating these meds satis and f/u labs were much improved... her TG's were still incr at 265 but she states this was secondary to "fruit juice" and feels they'll be better when rechecked off this & now on "soy" as well... she continues her f/u in the [facility]id Clinic...   ~  May 10, 2009:  she's had several problems over the last few months- dental probs w/ tooth implant;  HA's w/ eval DrHirsch she says (on DCN100), & UTI Rx'd by DrTomblin w/ Cipro but not resolved- we discussed empiric Rx w/ [id number]... she has been on a diet & lost 23#... she wants Transderm-scop for up-coming cruise.   ~  September 07, 2009:  she saw DrCooper for Cards 12/10 w/ CP- f/u Myoview was neg, repeat cath w/ +CAD & no change from prev cath 2007 (stent in RCA widely patent);  same meds- ASA/ Plavix/ Metoprolol/ + Crestor/ Niacin/ FishOil/ etc (see below for details & recent labs)...  she went to the ER 12/24 w/ left leg pain- mainly left knee pain & work up included XRays- DJD, sm effusion, no fx;  neg VenDopplers;  labs OK;  given Tylenol#3 but sens to Codeine, using Motrin... husb has seen drMurphy in the past w/ shot in the knee that helped & we will refer.     Current Problem List:  HYPERTENSION (ICD-401.9) - controlled on  METOPROLOL 100mg Bid, and LASIX 20mg /d... BP= 126/64 and feeling OK- without HA, visual symptoms, CP, palpit, SOB, edema, etc...  CORONARY ARTERY DISEASE (ICD-414.00) - on above + ASA 81mg /d + PLAVIX 75mg /d...  followed by DrCooper.  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 she had PCI w/ drug eluting stent in RCA...  ~  NuclearStressTest 12/08 showed sm area of ischemia inferolateral wall, norm LVF=70%...  ~  2DEcho 12/08 showed no LV regional wall motion abn, EF=60%, some DD was seen...  ~  saw DrCooper 11/09- note reviewed... continue same meds, consider re-cath if CP occurs.  ~  recurrent CP 12/10:  Myoview w/ sm inferoapical infarct, no ischemia, EF= 76%;  12/10 cath showed 20%LMain, 60-70%midLAD, 40-50%ostial CIRC, midRCA stent patent w/o restenosis, EF= 65%... stable, no change from 2007, med Rx.  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  CEREBROVASCULAR DISEASE (ICD-437.9) - CT Br 2/09 showed sm vessel disease and old lacunar infarct... on ASA + PLAVIX.  MIXED HYPERLIPIDEMIA (ICD-272.2) - on CRESTOR 20mg /d + NIASPAN 500- 2Qhs + FISH OIL daily... followed in the [facility] Clinic.  ~  FLP 8/08 on Lipitor80 showed TChol 127, TG 201, HDL 34, LDL 61  ~  FLP 7/09 showed TChol 129, TG 265, HDL 36, LDL 65... Lipid Clinic may want to consider a fibrate...  ~  FLP 9/09 howed TChol 152, TG 225, HDL 30, LDL 70  ~  FLP 1/10 showed TChol 137, TG 203, HDL  38, LDL 58  ~  FLP 9/10 showed TChol 154, TG 225, HDL 42, LDL 81  DIABETES MELLITUS, TYPE II (ICD-250.00) - on METFORMIN 500mg - 1/2 tabBid...  ~  labs 8/08 showed BS= 162, HgA1c= 6.5.Marland KitchenMarland Kitchen  ~  labs 2/09 showed BS= 154...  ~  labs 9/09 (wt=152#) showed BS= 141, HgA1c= 6.1  ~  labs 9/10 (wt=126#) showed BS= 136, A1c= 6.1  GASTROESOPHAGEAL REFLUX DISEASE (ICD-530.81) - last EGD 4/08 showed GERD and deformed pylorus... Rx w/ PPI...  ~  she had a follow up w/ DrPatterson 7/09 for her GERD- on Omep 20mg Bid, and IBS- on fiber & Bentyl...  DIVERTICULAR DISEASE (ICD-562.10) - last colonoscopy 11/04 by DrSam showed divertics only... IBS (ICD-564.1) - on BENTYL 10mg  Bid...  URINARY  INCONTINENCE (ICD-788.30) - eval 7/08 by DrTannenbaum- prev pubovag sling surg 2004... she has persist symptoms and he rec physical therapy...   DEGENERATIVE JOINT DISEASE (ICD-715.90) - **SEE ABOVE**  ~  ER eval 12/10 for left knee pain- XRay w/ DJD, sm effusion- refer to Ortho, DrMurphy for shot...  NECK PAIN, CHRONIC (ICD-723.1) - s/p neck fusion surg x 2 in 2005 and 2006 by DrHirsh w/ a complex situation involving non-union, severe spondylosis, etc...  ~  saw DrHirsh 9/10- stable, continue conservative management...  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  DIZZINESS, CHRONIC (ICD-780.4) - full eval by DrSethi w/ CT/ MRI etc... Dx'd w/ benign positional vertigo and Rx'd w/ Eply manuever as needed.  ANXIETY (ICD-300.00) - on EFFEXOR 37.5mg /d for hot flashes & dizziness (she states this helps)...  ~  Sep09:  she wants her "Factor V checked"... states that her sister & granddaughter see DrEnnever w/ clots and he thought it would be a good idea to check Jeriann for Factor V Leiden Mutation (she has never had a venous thromboembolic phenomenon)... 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 LABS 9/09 neg for Factor V mutation.    Allergies: 1)  ! Codeine 2)  Sulfa 3)  Tetracycline 4)  Prednisone  Comments:  Nurse/Medical Assistant: The patient's medications and allergies were reviewed with the patient and were updated in the Medication and Allergy Lists.  Past History:  Past Medical History: HYPERTENSION (ICD-401.9) CORONARY ARTERY DISEASE (ICD-414.00) status post inferior wall MI 2007. PREMATURE VENTRICULAR CONTRACTIONS (ICD-427.69) CEREBROVASCULAR DISEASE (ICD-437.9) MIXED HYPERLIPIDEMIA (ICD-272.2) DIABETES MELLITUS, TYPE II (ICD-250.00) GASTROESOPHAGEAL REFLUX DISEASE (ICD-530.81) DIVERTICULAR DISEASE (ICD-562.10) IBS (ICD-564.1) URINARY INCONTINENCE (ICD-788.30) DEGENERATIVE JOINT  DISEASE (ICD-715.90) NECK PAIN, CHRONIC (ICD-723.1) SYNDROME, CHRONIC PAIN (ICD-338.4) DIZZINESS, CHRONIC (ICD-780.4) ANXIETY (ICD-300.00)  Past Surgical History: Cholecystectomy CSpine Surg 11/06 by DrHirsh Bladder Repair Hysterectomy Tonsillectomy Thumb surgery Neck surgery  temporomandibular joint surgery.  Family History: Reviewed history from 08/03/2009 and no changes required.  Mother with some form of heart ailments.  The patient is  unsure what.  The patient states that she has never known her father.  She has two siblings with known coronary artery disease.  Social History: Reviewed history from 08/03/2009 and no changes required. Patient has never smoked.  Alcohol Use - no Daily Caffeine Use Illicit Drug Use - no Married  Review of Systems      See HPI       The patient complains of difficulty walking.  The patient denies anorexia, fever, weight loss, weight gain, vision loss, decreased hearing, hoarseness, chest pain, syncope, dyspnea on exertion, peripheral edema, prolonged cough, headaches, hemoptysis, abdominal pain, melena,  hematochezia, severe indigestion/heartburn, hematuria, incontinence, muscle weakness, suspicious skin lesions, transient blindness, depression, unusual weight change, abnormal bleeding, enlarged lymph nodes, and angioedema.    Vital Signs:  Patient profile:   70 year old female Height:      61 inches Weight:      146.25 pounds O2 Sat:      99 % on Room air Temp:     98.5 degrees F oral Pulse rate:   69 / minute BP sitting:   126 / 64  (left arm) Cuff size:   regular  Vitals Entered By: Randell Loop CMA (September 07, 2009 10:13 AM)  O2 Sat at Rest %:  99 O2 Flow:  Room air CC: 4 month ROV & post ER visit for left knee pain... Comments meds updated today   Physical Exam  Additional Exam:  WD, WN, 70 y/o WF in NAD... GENERAL:  Alert & oriented; pleasant & cooperative... HEENT:  [place]/AT, EOM-wnl, PERRLA, EACs-clear, TMs-wnl,  NOSE-clear, THROAT-clear & wnl. NECK:  Supple w/ decrROM & scars from surg; no JVD; normal carotid impulses w/o bruits;  no thyromegaly or nodules palpated; no lymphadenopathy. CHEST:  Clear to P & A; without wheezes/ rales/ or rhonchi heard... HEART:  Regular Rhythm; without murmurs/ rubs/ or gallops detected... ABDOMEN:  Soft & nontender; normal bowel sounds; no organomegaly or masses palpated... EXT: without deformities, mod arthritic changes w/ knee crepitus; no varicose veins/ +venous insuffic/ tr edema. NEURO:  CN's intact; no focal neuro deficits... DERM:  No lesions noted; no rash etc...     MISC. Report  Procedure date:  09/07/2009  Findings:      DATA REVIEWED:   ~  ER note from 08/26/09...  ~  XRays left knee...  ~  Cardiac eval w/ Cath/ Myoview...    Impression & Recommendations:  Problem # 1:  HYPERTENSION, UNSPECIFIED (ICD-401.9) BP stable-  same meds. Her updated medication list for this problem includes:    Metoprolol Tartrate 100 Mg Tabs (Metoprolol tartrate) .Marland Kitchen... Take 1 tab by mouth two times a day...    Lasix 20 Mg Tabs (Furosemide) .Marland Kitchen... 1 by mouth once daily  Problem # 2:  CORONARY ARTERY DISEASE (ICD-414.00) Followed by drCooper & recent cath is stable- same Rx. Her updated medication list for this problem includes:    Aspirin 81 Mg Tbec (Aspirin) .Marland Kitchen... Take 1 tab by mouth once daily...    Plavix 75 Mg Tabs (Clopidogrel bisulfate) .Marland Kitchen... 1 by mouth once daily    Nitrostat 0.4 Mg Subl (Nitroglycerin) ..... Use as directed for cp...    Metoprolol Tartrate 100 Mg Tabs (Metoprolol tartrate) .Marland Kitchen... Take 1 tab by mouth two times a day...    Lasix 20 Mg Tabs (Furosemide) .Marland Kitchen... 1 by mouth once daily  Problem # 3:  CEREBROVASCULAR DISEASE (ICD-437.9) Continue ASA & Plavix...  Problem # 4:  MIXED HYPERLIPIDEMIA (ICD-272.2) Stable on meds-  needs better diet. Her updated medication list for this problem includes:    Crestor 20 Mg Tabs (Rosuvastatin calcium)  .Marland Kitchen... Take 1 tab by mouth at bedtime...    Niacin Cr 500 Mg Cpcr (Niacin) .Marland Kitchen... Take 2 tabs by mouth at bedtime...  Problem # 5:  DIABETES MELLITUS, TYPE II (ICD-250.00) Stable-  same med, better diet, get wt down!!! Her updated medication list for this problem includes:    Aspirin 81 Mg Tbec (Aspirin) .Marland Kitchen... Take 1 tab by mouth once daily...    Metformin Hcl 500 Mg Tabs (Metformin hcl) .Marland Kitchen... 1/2 by  mouth two times a day  Problem # 6:  DEGENERATIVE JOINT DISEASE (ICD-715.90) We will refer to drMurphy for injection... Rx for [facility] written. Her updated medication list for this problem includes:    Aspirin 81 Mg Tbec (Aspirin) .Marland Kitchen... Take 1 tab by mouth once daily...    Vicodin 5-500 Mg Tabs (Hydrocodone-acetaminophen) .Marland Kitchen... Take 1 tab by mouth three times a day as needed for pain...  Orders: Orthopedic Referral (Ortho)  Problem # 7:  OTHER MEDICAL PROBLEMS AS NOTED>>>  Complete Medication List: 1)  Aspirin 81 Mg Tbec (Aspirin) .... Take 1 tab by mouth once daily.Marland KitchenMarland Kitchen 2)  Plavix 75 Mg Tabs (Clopidogrel bisulfate) .Marland Kitchen.. 1 by mouth once daily 3)  Nitrostat 0.4 Mg Subl (Nitroglycerin) .... Use as directed for cp... 4)  Metoprolol Tartrate 100 Mg Tabs (Metoprolol tartrate) .... Take 1 tab by mouth two times a day... 5)  Lasix 20 Mg Tabs (Furosemide) .Marland Kitchen.. 1 by mouth once daily 6)  Crestor 20 Mg Tabs (Rosuvastatin calcium) .... Take 1 tab by mouth at bedtime.Marland KitchenMarland Kitchen 7)  Niacin Cr 500 Mg Cpcr (Niacin) .... Take 2 tabs by mouth at bedtime.Marland KitchenMarland Kitchen 8)  Fish Oil Concentrate 1000 Mg Caps (Omega-3 fatty acids) .... Take as directed by lipid clinic.Marland KitchenMarland Kitchen 9)  Coq10 100 Mg Caps (Coenzyme q10) .... Take 1 capsule by mouth once a day 10)  Metformin Hcl 500 Mg Tabs (Metformin hcl) .... 1/2 by mouth two times a day 11)  Protonix 40 Mg Tbec (Pantoprazole sodium) .... Take 1 tab by mouth once daily (30 min before the first meal of the day) 12)  Bentyl 10 Mg Caps (Dicyclomine hcl) .... Take one capsule by mouth two times a  day 13)  Flaxseed Oil 1000 Mg Caps (Flaxseed (linseed)) .Marland Kitchen.. 1 by mouth once daily 14)  Lyrica 75 Mg Caps (Pregabalin) .... Take 1 capsule by mouth two times a day 15)  Caltrate 600+d 600-400 Mg-unit Tabs (Calcium carbonate-vitamin d) .... Take 1 tab by mouth two times a day... 16)  Glucosamine 500 Mg Caps (Glucosamine sulfate) .... Take 1 capsule by mouth two times a day 17)  Multivitamins Tabs (Multiple vitamin) .Marland Kitchen.. 1 tab once daily 18)  B-plex Tabs (B complex-c-folic acid) .Marland Kitchen.. 1 by mouth once daily 19)  Vitamin C 500 Mg Tabs (Ascorbic acid) .... 2 tabs qd 20)  [facility]vs Vitamin D 1000 Unit Caps (Cholecalciferol) .Marland Kitchen.. 1 daily. 21)  Cinnamon 500 Mg Caps (Cinnamon) .... Take 1 capsule by mouth two times a day 22)  Garlic Powd (Garlic) .... 500mg  2 tabs once daily 23)  Effexor 37.5 Mg Tabs (Venlafaxine hcl) .... Take 1 tab by mouth once daily... 24)  Meclizine Hcl 25 Mg Tabs (Meclizine hcl) .Marland Kitchen.. 1 tab every 4-6hrs prn dizziness 25)  Vicodin 5-500 Mg Tabs (Hydrocodone-acetaminophen) .... Take 1 tab by mouth three times a day as needed for pain...  Other Orders: Prescription Created Electronically [id number])  Patient Instructions: 1)  Today we updated your med list- see below.... 2)  Continue your current meds the same... 3)  We wrote a new perscription for VICODIN to try for the pain.Marland KitchenMarland Kitchen 4)  We will arrange for an appt w/ DrMurphy et al for an eval of your left knee... 5)  Call for any problems.Marland KitchenMarland Kitchen 6)  Let's plan a follow up visit in about 4 months w/ fasting blood work at that time... Prescriptions: VICODIN 5-500 MG TABS (HYDROCODONE-ACETAMINOPHEN) take 1 tab by mouth three times a day as needed for pain...  #30 x  5   Entered and Authorized by:   Michele Mcalpine MD   Signed by:   Michele Mcalpine MD on 09/07/2009   Method used:   Print then Give to Patient   RxID:   [id number]

## 2010-10-05 NOTE — Assessment & Plan Note (Signed)
Summary: PAIN IN RIBS AND BACK/ MBW   Primary Provider/Referring Provider:  [name], MD  CC:  ov - pain in right rib area and goes around to back - ongoing for 2 weeks - pt questions if it is r/t a fall 2 weeks ago - pt reports feeling knots under the skin in area of discomfort.  History of Present Illness: 70 year old female patient of Dr Kriste Basque with known history of HTN, CAD, hyperlipidemia, DM, GERD and chronic dizziness   December 06, 2009--Acute Visit.  Complains of chest congestion, non prod cough, wheezing, rattling in chest, and low grade fever  4 days.  Also states "ribs and chest are hurting from coughing so much." OTC not helping . Husband had similar symptoms which he was tx for bronchitis. Had recent UTI tx w/ Cipro.  Denies chest pain, dyspnea, orthopnea, hemoptysis, fever, n/v/d, edema, headache.   May 15, 2010--Presents for  pain in right rib area and goes around to back - ongoing for 2 weeks - pt questions if it is r/t a fall 2 weeks ago - pt reports feeling knots under the skin in area of discomfort she fell 4 weeks trying to shave legs, foot slipped causing her to fall back to catch herself, no LOC. She fell against her left side/back but that side does not hurt. Denies chest pain, dyspnea, orthopnea, hemoptysis, fever, n/v/d, edema, headache,recent travel or antibiotics. No otc used.   Current Medications (verified): 1)  Advair Diskus 100-50 Mcg/dose Aepb (Fluticasone-Salmeterol) .Marland Kitchen.. 1 Inhalation Two Times A Day As Directed... 2)  Aspirin 81 Mg  Tbec (Aspirin) .... Take 1 Tab By Mouth Once Daily.Marland KitchenMarland Kitchen 3)  Plavix 75 Mg  Tabs (Clopidogrel Bisulfate) .Marland Kitchen.. 1 By Mouth Once Daily 4)  Nitrostat 0.4 Mg  Subl (Nitroglycerin) .... Use As Directed For Cp... 5)  Metoprolol Tartrate 100 Mg  Tabs (Metoprolol Tartrate) .... Take 1 Tab By Mouth Two Times A Day... 6)  Cozaar 50 Mg Tabs (Losartan Potassium) .... Take One Tablet By Mouth Daily 7)  Lasix 20 Mg  Tabs (Furosemide) .Marland Kitchen.. 1 By  Mouth Once Daily 8)  Crestor 20 Mg  Tabs (Rosuvastatin Calcium) .... Take 1 Tab By Mouth At Bedtime.Marland KitchenMarland Kitchen 9)  Niacin Cr 500 Mg  Cpcr (Niacin) .... Take 2 Tabs By Mouth At Bedtime... 10)  Fish Oil Concentrate 1000 Mg Caps (Omega-3 Fatty Acids) .... Take As Directed By [facility]...4 Daily 11)  Coq10 100 Mg Caps (Coenzyme Q10) .... Take 1 Capsule By Mouth Once A Day 12)  Metformin Hcl 500 Mg  Tabs (Metformin Hcl) .... 1/2 By Mouth Two Times A Day 13)  Prilosec Otc 20 Mg Tbec (Omeprazole Magnesium) .... Take 1 Tablet By Mouth Two Times A Day 14)  Bentyl 10 Mg Caps (Dicyclomine Hcl) .... Take One Capsule By Mouth Two Times A Day 15)  Flaxseed Oil 1000 Mg  Caps (Flaxseed (Linseed)) .Marland Kitchen.. 1 By Mouth Two Times A Day 16)  Cod Liver Oil  Oil (Cod Liver Oil) .... Take 2 Daily 17)  Arthrotec 75-200 Mg-Mcg Tabs (Diclofenac-Misoprostol) .... Take One Tablet By Mouth Two Times A Day 18)  Methocarbamol 500 Mg Tabs (Methocarbamol) .... Take 1 Tablet By Mouth Every 6 Hours As Needed For Muscle Spasms 19)  Lyrica 75 Mg  Caps (Pregabalin) .... Take 1 Capsule By Mouth Two Times A Day 20)  Caltrate 600+d 600-400 Mg-Unit Tabs (Calcium Carbonate-Vitamin D) .... Take 1 Tab By Mouth Two Times A Day... 21)  Glucosamine 500 Mg Caps (Glucosamine Sulfate) .... Take 1 Capsule By Mouth Two Times A Day 22)  Multivitamins   Tabs (Multiple Vitamin) .Marland Kitchen.. 1 Tab Once Daily 23)  Vitamin C 500 Mg  Tabs (Ascorbic Acid) .... 2 Tabs Qd 24)  Cvs Vitamin D 1000 Unit Caps (Cholecalciferol) .Marland Kitchen.. 1 Daily. 25)  Cinnamon 500 Mg Caps (Cinnamon) .... Take 1 Capsule By Mouth Two Times A Day 26)  Bilberry 100 Mg Caps (Bilberry (Vaccinium Myrtillus)) .... Take 1 Capsule By Mouth Two Times A Day 27)  Garlic   Powd (Garlic) .... 500mg  2 Tabs Once Daily 28)  Aloe Vera 470 Mg Caps (Aloe Vera) .... Take 1 Capsule By Mouth Two Times A Day 29)  Effexor 37.5 Mg Tabs (Venlafaxine Hcl) .... Take 1 Tab By Mouth Once Daily... 30)  Meclizine Hcl 25 Mg  Tabs  (Meclizine Hcl) .Marland Kitchen.. 1 Tab Every 4-6hrs Prn Dizziness  Allergies (verified): 1)  ! Codeine 2)  Sulfa 3)  Tetracycline 4)  Prednisone  Comments:  Nurse/Medical Assistant: The patient's medications and allergies were reviewed with the patient and were updated in the Medication and Allergy Lists.  Past History:  Past Medical History: Last updated: 04/20/2010 BRONCHITIS, ACUTE (ICD-466.0) HYPERTENSION (ICD-401.9) CORONARY ARTERY DISEASE (ICD-414.00) status post inferior wall MI 2007. PREMATURE VENTRICULAR CONTRACTIONS (ICD-427.69) CEREBROVASCULAR DISEASE (ICD-437.9) MIXED HYPERLIPIDEMIA (ICD-272.2) DIABETES MELLITUS, TYPE II (ICD-250.00) GASTROESOPHAGEAL REFLUX DISEASE (ICD-530.81) DIVERTICULAR DISEASE (ICD-562.10) IBS (ICD-564.1) URINARY INCONTINENCE (ICD-788.30) DEGENERATIVE JOINT DISEASE (ICD-715.90) NECK PAIN, CHRONIC (ICD-723.1) SYNDROME, CHRONIC PAIN (ICD-338.4) DIZZINESS, CHRONIC (ICD-780.4) ANXIETY (ICD-300.00)  Past Surgical History: Last updated: 04/20/2010 Cholecystectomy CSpine Surg 11/06 by Carles Collet Bladder Repair Hysterectomy Tonsillectomy Thumb surgery Neck surgery Temporomandibular joint surgery  Family History: Last updated: 08/03/2009  Mother with some form of heart ailments.  The patient is  unsure what.  The patient states that she has never known her father.  She has two siblings with known coronary artery disease.  Social History: Last updated: 08/03/2009 Patient has never smoked.  Alcohol Use - no Daily Caffeine Use Illicit Drug Use - no Married  Risk Factors: Smoking Status: never (04/20/2010)  Review of Systems      See HPI  Vital Signs:  Patient profile:   70 year old female Weight:      154.38 pounds O2 Sat:      96 % on Room air Temp:     97.4 degrees F oral Pulse rate:   69 / minute BP sitting:   126 / 84  (left arm) Cuff size:   regular  Vitals Entered By: Boone Master CNA/MA (May 15, 2010 11:38 AM)  O2 Flow:   Room air  Physical Exam  Additional Exam:  WD, WN, 69y/o WF in NAD... GENERAL:  Alert & oriented; pleasant & cooperative... HEENT:  [place]/AT, EOM-wnl, PERRLA, EACs-clear, TMs-wnl, NOSE-clear, THROAT-clear & wnl. NECK:  Supple w/ decrROM & scars from surg; no JVD; normal carotid impulses w/o bruits;  no thyromegaly or nodules palpated; no lymphadenopathy. CHEST:  Clear to P & A; without wheezes/ rales/ or rhonchi heard... HEART:  Regular Rhythm; without murmurs/ rubs/ or gallops detected... ABDOMEN:  Soft & nontender; normal bowel sounds; no organomegaly or masses palpated... EXT: without deformities, mod arthritic changes w/ knee crepitus; no varicose veins/ +venous insuffic/ tr edema. NEURO:  CN's intact; no focal neuro deficits... DERM:  No lesions noted; no rash etc... Musculoskeletal: tender along right lateral cervical , suprascapular area down along right lateral ribs, no eccymosis noted. no rib deformity  noted. pain w/ turning to side.     Impression & Recommendations:  Problem # 1:  SYNDROME, CHRONIC PAIN (ICD-338.4)  Right sided neck/rib pain from strain  REC:  Continue on Arthrotec two times a day  May use Methacarbodal four times a day as needed for muscle spasms,  Warm heat to side four times a day as needed  Tramadol 50mg  1/2-1 by mouth three times a day as needed severe pain-may make you sleepy.  Please contact office for sooner follow up if symptoms do not improve or worsen   Orders: Est. Patient Level IV (46962)  Medications Added to Medication List This Visit: 1)  Tramadol Hcl 50 Mg Tabs (Tramadol hcl) .... 1/2 - 1 by mouth three times a day as needed pain  Complete Medication List: 1)  Advair Diskus 100-50 Mcg/dose Aepb (Fluticasone-salmeterol) .Marland Kitchen.. 1 inhalation two times a day as directed... 2)  Aspirin 81 Mg Tbec (Aspirin) .... Take 1 tab by mouth once daily.Marland KitchenMarland Kitchen 3)  Plavix 75 Mg Tabs (Clopidogrel bisulfate) .Marland Kitchen.. 1 by mouth once daily 4)  Nitrostat 0.4 Mg Subl  (Nitroglycerin) .... Use as directed for cp... 5)  Metoprolol Tartrate 100 Mg Tabs (Metoprolol tartrate) .... Take 1 tab by mouth two times a day... 6)  Cozaar 50 Mg Tabs (Losartan potassium) .... Take one tablet by mouth daily 7)  Lasix 20 Mg Tabs (Furosemide) .Marland Kitchen.. 1 by mouth once daily 8)  Crestor 20 Mg Tabs (Rosuvastatin calcium) .... Take 1 tab by mouth at bedtime.Marland KitchenMarland Kitchen 9)  Niacin Cr 500 Mg Cpcr (Niacin) .... Take 2 tabs by mouth at bedtime... 10)  Fish Oil Concentrate 1000 Mg Caps (Omega-3 fatty acids) .... Take as directed by lipid clinic...4 daily 11)  Coq10 100 Mg Caps (Coenzyme q10) .... Take 1 capsule by mouth once a day 12)  Metformin Hcl 500 Mg Tabs (Metformin hcl) .... 1/2 by mouth two times a day 13)  Prilosec Otc 20 Mg Tbec (Omeprazole magnesium) .... Take 1 tablet by mouth two times a day 14)  Bentyl 10 Mg Caps (Dicyclomine hcl) .... Take one capsule by mouth two times a day 15)  Flaxseed Oil 1000 Mg Caps (Flaxseed (linseed)) .Marland Kitchen.. 1 by mouth two times a day 16)  Cod Liver Oil Oil (Cod liver oil) .... Take 2 daily 17)  Arthrotec 75-200 Mg-mcg Tabs (Diclofenac-misoprostol) .... Take one tablet by mouth two times a day 18)  Methocarbamol 500 Mg Tabs (Methocarbamol) .... Take 1 tablet by mouth every 6 hours as needed for muscle spasms 19)  Lyrica 75 Mg Caps (Pregabalin) .... Take 1 capsule by mouth two times a day 20)  Caltrate 600+d 600-400 Mg-unit Tabs (Calcium carbonate-vitamin d) .... Take 1 tab by mouth two times a day... 21)  Glucosamine 500 Mg Caps (Glucosamine sulfate) .... Take 1 capsule by mouth two times a day 22)  Multivitamins Tabs (Multiple vitamin) .Marland Kitchen.. 1 tab once daily 23)  Vitamin C 500 Mg Tabs (Ascorbic acid) .... 2 tabs qd 24)  Cvs Vitamin D 1000 Unit Caps (Cholecalciferol) .Marland Kitchen.. 1 daily. 25)  Cinnamon 500 Mg Caps (Cinnamon) .... Take 1 capsule by mouth two times a day 26)  Bilberry 100 Mg Caps (Bilberry (vaccinium myrtillus)) .... Take 1 capsule by mouth two times a  day 27)  Garlic Powd (Garlic) .... 500mg  2 tabs once daily 28)  Aloe Vera 470 Mg Caps (Aloe vera) .... Take 1 capsule by mouth two times a day 29)  Effexor 37.5 Mg Tabs (Venlafaxine hcl) .Marland KitchenMarland KitchenMarland Kitchen  Take 1 tab by mouth once daily... 30)  Meclizine Hcl 25 Mg Tabs (Meclizine hcl) .Marland Kitchen.. 1 tab every 4-6hrs prn dizziness 31)  Tramadol Hcl 50 Mg Tabs (Tramadol hcl) .... 1/2 - 1 by mouth three times a day as needed pain  Other Orders: Flu Vaccine 28yrs + MEDICARE PATIENTS (H4742) Administration Flu vaccine - MCR (V9563)  Patient Instructions: 1)  Continue on Arthrotec two times a day  2)  May use Methacarbodal four times a day as needed for muscle spasms,  3)  Warm heat to side four times a day as needed  4)  Tramadol 50mg  1/2-1 by mouth three times a day as needed severe pain-may make you sleepy.  5)  Please contact office for sooner follow up if symptoms do not improve or worsen  Prescriptions: TRAMADOL HCL 50 MG TABS (TRAMADOL HCL) 1/2 - 1 by mouth three times a day as needed pain  #20 x 0   Entered and Authorized by:   Rubye Oaks NP   Signed by:   Tammy Parrett NP on 05/15/2010   Method used:   Electronically to        [facility]  [address] [id number] (retail)       [address]       [place], [place]  [address]       Ph: [phone] or [phone]       Fax: [phone]   RxID:   [id number]   Flu Vaccine Consent Questions     Do you have a history of severe allergic reactions to this vaccine? no    Any prior history of allergic reactions to egg and/or gelatin? no    Do you have a sensitivity to the preservative Thimersol? no    Do you have a past history of Guillan-Barre Syndrome? no    Do you currently have an acute febrile illness? no    Have you ever had a severe reaction to latex? no    Vaccine information given and explained to patient? yes    Are you currently pregnant? no    Lot Number:[id number]   Exp Date:03/03/2011   Site Given  Left Deltoid IMedflu     Randell Loop [facility]  May 15, 2010 12:31 PM

## 2010-10-05 NOTE — Letter (Signed)
Summary: [facility]  [facility]   Imported By: Sherian Rein 04/05/2010 08:42:55  _____________________________________________________________________  External Attachment:    Type:   Image     Comment:   External Document

## 2010-10-05 NOTE — Assessment & Plan Note (Signed)
Summary: 4 months/ mbw   Primary Care Provider:  [name], MD  CC:  4 month ROV & review of mult medical problems....  History of Present Illness: 70 y/o WF here for a follow up visit... he has multiple medical problems as noted below...    ~  May09 her Lipitor80 was stopped due to weakness in her legs and she was referred to the [facility]... they have her on CRESTOR 20mg /d and NIACIN CR 500mg - 2 tabs at bedtime... she is tolerating these meds satis and f/u labs were much improved... her TG's were still incr at 265 but she states this was secondary to "fruit juice" and feels they'll be better when rechecked off this & now on "soy" as well... she continues her f/u in the [facility]id Clinic...   ~  Sep10:  she's had several problems over the last few months- dental probs w/ tooth implant;  HA's w/ eval DrHirsch she says (on DCN100), & UTI Rx'd by DrTomblin w/ Cipro but not resolved- we discussed empiric Rx w/ [id number]... she has been on a diet & lost 23#... she wants Transderm-scop for up-coming cruise.   ~  September 07, 2009:  she saw DrCooper for Cards 12/10 w/ CP- f/u Myoview was neg, repeat cath w/ +CAD & no change from prev cath 2007 (stent in RCA widely patent);  same meds- ASA/ Plavix/ Metoprolol/ + Crestor/ Niacin/ FishOil/ etc (see below for details & recent labs)...  she went to the ER 12/24 w/ left leg pain- mainly left knee pain & work up included XRays- DJD, sm effusion, no fx;  neg VenDopplers;  labs OK;  given Tylenol#3 but sens to Codeine, using Motrin... husb has seen DrMurphy in the past w/ shot in the knee that helped & we will refer.   ~  Jan 05, 2010:  seen last month by TP w/ bronchitic exac- treated w/ Augmentin, Mucinex, Hydromet & improved, but still wheezing & some congestion, she says... we discussed adding Advair100Bid... she saw DrCooper for Cards 1/11 and Cozaar added for borderline BP control... also saw DrMurphy for Ortho 1/11 w/ shot in knee & phys therapy... also seen  by DrHirsh for NS Rx w/ Arthrotec & muscle relaxer...   Current Problem List:  BRONCHITIS, ACUTE (ICD-466.0) - see above> AB w/ refractory episode 4/11 & we will add ADVAIR 100Bid, to he MUCINEX OTC, & Hydromet cough syrup Prn...  HYPERTENSION (ICD-401.9) - controlled on  METOPROLOL 100mg Bid, LOSARTAN 50mg /d, and LASIX 20mg /d... BP= 122/80 and feeling OK- without HA, visual symptoms, CP, palpit, SOB, edema, etc...  CORONARY ARTERY DISEASE (ICD-414.00) - on above + ASA 81mg /d + PLAVIX 75mg /d...  followed by DrCooper and his notes are reviewed.  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 she had PCI w/ drug eluting stent in RCA...  ~  NuclearStressTest 12/08 showed sm area of ischemia inferolateral wall, norm LVF=70%...  ~  2DEcho 12/08 showed no LV regional wall motion abn, EF=60%, some DD was seen...  ~  saw DrCooper 11/09- note reviewed... continue same meds, consider re-cath if CP occurs.  ~  recurrent CP 12/10:  Myoview w/ sm inferoapical infarct, no ischemia, EF= 76%;  12/10 cath showed 20%LMain, 60-70%midLAD, 40-50%ostial CIRC, midRCA stent patent w/o restenosis, EF= 65%... stable, no change from 2007, med Rx.  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  CEREBROVASCULAR DISEASE (ICD-437.9) -  CT Br 2/09 showed sm vessel disease and old lacunar infarct... on ASA + PLAVIX.  MIXED HYPERLIPIDEMIA (ICD-272.2) - on CRESTOR 20mg /d + NIASPAN 500- 2Qhs + FISH OIL daily... followed in the Lipid Clinic.  ~  FLP 8/08 on Lipitor80 showed TChol 127, TG 201, HDL 34, LDL 61  ~  FLP 7/09 showed TChol 129, TG 265, HDL 36, LDL 65... Lipid Clinic may want to consider a fibrate...  ~  FLP 9/09 howed TChol 152, TG 225, HDL 30, LDL 70  ~  FLP 1/10 showed TChol 137, TG 203, HDL 38, LDL 58  ~  FLP 9/10 showed TChol 154, TG 225, HDL 42, LDL 81  DIABETES MELLITUS, TYPE II (ICD-250.00) - on METFORMIN 500mg -  1/2 tabBid...  ~  labs 8/08 showed BS= 162, HgA1c= 6.5.Marland KitchenMarland Kitchen  ~  labs 2/09 showed BS= 154...  ~  labs 9/09 (wt=152#) showed BS= 141, HgA1c= 6.1  ~  labs 9/10 (wt=126#) showed BS= 136, A1c= 6.1  GASTROESOPHAGEAL REFLUX DISEASE (ICD-530.81) - last EGD 4/08 showed GERD and deformed pylorus... Rx w/ PPI...  ~  she had a follow up w/ DrPatterson 7/09 for her GERD- on OMEP 20mg Bid, and IBS- on fiber & Bentyl...  DIVERTICULAR DISEASE (ICD-562.10) - last colonoscopy 11/04 by DrSam showed divertics only... IBS (ICD-564.1) - on BENTYL 10mg  Bid...  URINARY INCONTINENCE (ICD-788.30) - eval 7/08 by DrTannenbaum- prev pubovag sling surg 2004... she has persist symptoms and he rec physical therapy...  ~  she has had recurrent UTIs & Rx w/ Cipro...  DEGENERATIVE JOINT DISEASE (ICD-715.90) - **SEE ABOVE**  ~  ER eval 12/10 for left knee pain- XRay w/ DJD, sm effusion- refer to Ortho, DrMurphy for shot (helped)...  NECK PAIN, CHRONIC (ICD-723.1) - s/p neck fusion surg x 2 in 2005 and 2006 by DrHirsh w/ a complex situation involving non-union, severe spondylosis, etc... notes from DrHirsh reviewed> on ARTHROTEC 75mg Bid, & METHOCARBAMOL 500mg Qid.  ~  saw DrHirsh 9/10- stable, continue conservative management...  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  DIZZINESS, CHRONIC (ICD-780.4) - full eval by DrSethi w/ CT/ MRI etc... Dx'd w/ benign positional vertigo and Rx'd w/ Eply manuever as needed.  ANXIETY (ICD-300.00) - on EFFEXOR 37.5mg /d for hot flashes & dizziness (she states this helps)...  ~  Sep09:  she wants her "Factor V checked"... states that her sister & granddaughter see DrEnnever w/ clots and he thought it would be a good idea to check Caro for Factor V Leiden Mutation (she has never had a venous thromboembolic phenomenon)... 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 LABS 9/09 neg for Factor V  mutation.   Allergies: 1)  ! Codeine 2)  Sulfa 3)  Tetracycline 4)  Prednisone  Comments:  Nurse/Medical Assistant: The patient's medications and allergies were reviewed with the patient and were updated in the Medication and Allergy Lists.  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  Mother with some form of heart ailments.  The patient is  unsure what.  The patient states that she has never known her father.  She has two siblings with known coronary artery disease.  Social History: Reviewed history from 08/03/2009 and no changes required. Patient has never smoked.  Alcohol Use - no Daily Caffeine Use Illicit Drug Use - no Married  Review of Systems      See HPI       The patient complains of dyspnea on exertion.  The patient denies anorexia, fever, weight loss, weight gain, vision loss, decreased hearing, hoarseness, chest pain, syncope, peripheral edema, prolonged cough, headaches, hemoptysis, abdominal pain, melena, hematochezia, severe indigestion/heartburn, hematuria, incontinence, muscle weakness, suspicious skin lesions, transient blindness, difficulty  walking, depression, unusual weight change, abnormal bleeding, enlarged lymph nodes, and angioedema.    Vital Signs:  Patient profile:   70 year old female Height:      61 inches Weight:      150 pounds BMI:     28.44 O2 Sat:      97 % on Room air Temp:     99.0 degrees F oral Pulse rate:   68 / minute BP sitting:   122 / 82  (left arm) Cuff size:   regular  Vitals Entered By: Randell Loop CMA (Jan 05, 2010 10:38 AM)  O2 Sat at Rest %:  97 O2 Flow:  Room air CC: 4 month ROV & review of mult medical problems... Is Patient Diabetic? Yes Pain Assessment Patient in pain? no      Comments meds updated today--pt brought list of all meds today   Physical Exam  Additional Exam:  WD, WN, 70 y/o WF in NAD... GENERAL:  Alert & oriented; pleasant & cooperative... HEENT:  [place]/AT, EOM-wnl, PERRLA, EACs-clear, TMs-wnl, NOSE-clear, THROAT-clear & wnl. NECK:  Supple w/ decrROM & scars from surg; no JVD; normal carotid impulses w/o bruits;  no thyromegaly or nodules palpated; no lymphadenopathy. CHEST:  Clear to P & A; without wheezes/ rales/ or rhonchi heard... HEART:  Regular Rhythm; without murmurs/ rubs/ or gallops detected... ABDOMEN:  Soft & nontender; normal bowel sounds; no organomegaly or masses palpated... EXT: without deformities, mod arthritic changes w/ knee crepitus; no varicose veins/ +venous insuffic/ tr edema. NEURO:  CN's intact; no focal neuro deficits... DERM:  No lesions noted; no rash etc...    Impression & Recommendations:  Problem # 1:  BRONCHITIS, ACUTE (ICD-466.0) We decided to add ADVAIR & f/u in several months to be sure this AB has cleared... The following medications were removed from the medication list:    Augmentin 875-125 Mg Tabs (Amoxicillin-pot clavulanate) .Marland Kitchen... 1 by mouth two times a day    Hydromet 5-1.5 Mg/49ml Syrp (Hydrocodone-homatropine) .Marland Kitchen... 1-2 tsp every 4-6 hr as needed cough , congesiton Her updated medication list for this problem  includes:    Cipro 250 Mg Tabs (Ciprofloxacin hcl) .Marland Kitchen... Take as directed    Advair Diskus 100-50 Mcg/dose Aepb (Fluticasone-salmeterol) .Marland Kitchen... 1 inhalation two times a day as directed...  Orders: Prescription Created Electronically [phone])  Problem # 2:  ESSENTIAL HYPERTENSION, BENIGN (ICD-401.1) Controlled-  continue same meds. Her updated medication list for this problem includes:    Metoprolol Tartrate 100 Mg Tabs (Metoprolol tartrate) .Marland Kitchen... Take 1 tab by mouth two times a day...    Cozaar 50 Mg Tabs (Losartan potassium) .Marland Kitchen... Take one tablet by mouth daily    Lasix 20 Mg Tabs (Furosemide) .Marland KitchenMarland KitchenMarland KitchenMarland Kitchen  1 by mouth once daily  Problem # 3:  CORONARY ARTERY DISEASE (ICD-414.00) Followed by DrCooper>  same meds. Her updated medication list for this problem includes:    Aspirin 81 Mg Tbec (Aspirin) .Marland Kitchen... Take 1 tab by mouth once daily...    Plavix 75 Mg Tabs (Clopidogrel bisulfate) .Marland Kitchen... 1 by mouth once daily    Nitrostat 0.4 Mg Subl (Nitroglycerin) ..... Use as directed for cp...    Metoprolol Tartrate 100 Mg Tabs (Metoprolol tartrate) .Marland Kitchen... Take 1 tab by mouth two times a day...    Cozaar 50 Mg Tabs (Losartan potassium) .Marland Kitchen... Take one tablet by mouth daily    Lasix 20 Mg Tabs (Furosemide) .Marland Kitchen... 1 by mouth once daily  Problem # 4:  MIXED HYPERLIPIDEMIA (ICD-272.2) Stable on meds>  we will f/u FLP on ret. Her updated medication list for this problem includes:    Crestor 20 Mg Tabs (Rosuvastatin calcium) .Marland Kitchen... Take 1 tab by mouth at bedtime...    Niacin Cr 500 Mg Cpcr (Niacin) .Marland Kitchen... Take 2 tabs by mouth at bedtime...  Problem # 5:  CEREBROVASCULAR DISEASE (ICD-437.9) Continue ASA, & Plavix...  Problem # 6:  DIABETES MELLITUS, TYPE II (ICD-250.00) Stable on diet + exercise>  f/u labs on ret. Her updated medication list for this problem includes:    Aspirin 81 Mg Tbec (Aspirin) .Marland Kitchen... Take 1 tab by mouth once daily...    Cozaar 50 Mg Tabs (Losartan potassium) .Marland Kitchen... Take one tablet by mouth  daily    Metformin Hcl 500 Mg Tabs (Metformin hcl) .Marland Kitchen... 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  Problem # 8:  OTHER MEDICAL PROBLEMS AS NOTED>>>  Complete Medication List: 1)  Aspirin 81 Mg Tbec (Aspirin) .... Take 1 tab by mouth once daily.Marland KitchenMarland Kitchen 2)  Plavix 75 Mg Tabs (Clopidogrel bisulfate) .Marland Kitchen.. 1 by mouth once daily 3)  Nitrostat 0.4 Mg Subl (Nitroglycerin) .... Use as directed for cp... 4)  Metoprolol Tartrate 100 Mg Tabs (Metoprolol tartrate) .... Take 1 tab by mouth two times a day... 5)  Cozaar 50 Mg Tabs (Losartan potassium) .... Take one tablet by mouth daily 6)  Lasix 20 Mg Tabs (Furosemide) .Marland Kitchen.. 1 by mouth once daily 7)  Crestor 20 Mg Tabs (Rosuvastatin calcium) .... Take 1 tab by mouth at bedtime.Marland KitchenMarland Kitchen 8)  Niacin Cr 500 Mg Cpcr (Niacin) .... Take 2 tabs by mouth at bedtime.Marland KitchenMarland Kitchen 9)  Fish Oil Concentrate 1000 Mg Caps (Omega-3 fatty acids) .... Take as directed by lipid clinic...4 daily 10)  Coq10 100 Mg Caps (Coenzyme q10) .... Take 1 capsule by mouth once a day 11)  Metformin Hcl 500 Mg Tabs (Metformin hcl) .... 1/2 by mouth two times a day 12)  Prilosec Otc 20 Mg Tbec (Omeprazole magnesium) .... Take 1 tablet by mouth two times a day 13)  Bentyl 10 Mg Caps (Dicyclomine hcl) .... Take one capsule by mouth two times a day 14)  Flaxseed Oil 1000 Mg Caps (Flaxseed (linseed)) .Marland Kitchen.. 1 by mouth two times a day 15)  Cod Liver Oil Oil (Cod liver oil) .... Take 2 daily 16)  Arthrotec 75-200 Mg-mcg Tabs (Diclofenac-misoprostol) .... Take one tablet by mouth two times a day 17)  Methocarbamol 500 Mg Tabs (Methocarbamol) .... Take 1 tablet by mouth every 6 hours as needed for muscle spasms 18)  Lyrica 75 Mg Caps (Pregabalin) .... Take 1 capsule by mouth two times a day 19)  Caltrate 600+d 600-400 Mg-unit Tabs (Calcium carbonate-vitamin d) .... Take 1 tab by mouth two times a day... 20)  Glucosamine 500 Mg Caps (Glucosamine  sulfate) .... Take 1 capsule by mouth two times a day 21)  Multivitamins Tabs (Multiple vitamin) .Marland Kitchen.. 1 tab once daily 22)  Vitamin C 500 Mg Tabs (Ascorbic acid) .... 2 tabs qd 23)  Cvs Vitamin D 1000 Unit Caps (Cholecalciferol) .Marland Kitchen.. 1 daily. 24)  Cinnamon 500 Mg Caps (Cinnamon) .... Take 1 capsule by mouth two times a day 25)  Bilberry 100 Mg Caps (Bilberry (vaccinium myrtillus)) .... Take 1 capsule by mouth two times a day 26)  Garlic Powd (Garlic) .... 500mg  2 tabs once daily 27)  Aloe Vera 470 Mg Caps (Aloe vera) .... Take 1 capsule by mouth two times a day 28)  Effexor 37.5 Mg Tabs (Venlafaxine hcl) .... Take 1 tab by mouth once daily... 29)  Meclizine Hcl 25 Mg Tabs (Meclizine hcl) .Marland Kitchen.. 1 tab every 4-6hrs prn dizziness 30)  Cipro 250 Mg Tabs (Ciprofloxacin hcl) .... Take as directed 31)  Advair Diskus 100-50 Mcg/dose Aepb (Fluticasone-salmeterol) .Marland Kitchen.. 1 inhalation two times a day as directed...  Patient Instructions: 1)  Today we updated your med list- see below.... 2)  Today we wrote a new perscription for ADVAIR to take 1 inhalation twice daily-  this does not have to be long term medication and you may wean off it when your breathing is back 100% without the congestion wheezing & inflammation.Marland KitchenMarland Kitchen 3)  Call for any questions.Marland KitchenMarland Kitchen  4)  I would like to recheck you in about 2-3 months to be sure this has cleared, and to do your FASTING blood work at that time. Prescriptions: ADVAIR DISKUS 100-50 MCG/DOSE AEPB (FLUTICASONE-SALMETEROL) 1 inhalation two times a day as directed...  #1 x prn   Entered and Authorized by:   Michele Mcalpine MD   Signed by:   Michele Mcalpine MD on 01/05/2010   Method used:   Print then Give to Patient   RxID:   [id number]

## 2010-10-05 NOTE — Letter (Signed)
Summary: [facility] Medical Clearance   [facility] Medical Clearance   Imported By: Roderic Ovens 02/23/2010 10:04:28  _____________________________________________________________________  External Attachment:    Type:   Image     Comment:   External Document

## 2010-10-05 NOTE — Letter (Signed)
Summary: [facility]  [facility]   Imported By: Lester Greenlee 09/23/2009 08:36:06  _____________________________________________________________________  External Attachment:    Type:   Image     Comment:   External Document

## 2010-10-05 NOTE — Assessment & Plan Note (Signed)
Summary: EPH   Visit Type:  Follow-up Primary Provider:  [name], MD  CC:  Post-hospital- Some chest pains and Sob.  History of Present Illness: This is a 70 year old woman with coronary artery disease who had an inferior wall MI in 2007 and was treated with the drug-eluting stent to the right coronary artery.  She underwent diagnostic cath last month domonstrating a patent RCA stent and moderate disease in the LAD (stable from 2007). Follow-up moview stress testing was done to rule out anterior ischemia. This study was reviewed and is negative for ischemia. LVEF has been preserved.  Reports fatigue and exertional dyspnea, both chronic complaints. Has not had recent exertional chest pain. No edema or other complaints.  Current Medications (verified): 1)  Aspirin 81 Mg  Tbec (Aspirin) .... Take 1 Tab By Mouth Once Daily.Marland KitchenMarland Kitchen 2)  Plavix 75 Mg  Tabs (Clopidogrel Bisulfate) .Marland Kitchen.. 1 By Mouth Once Daily 3)  Nitrostat 0.4 Mg  Subl (Nitroglycerin) .... Use As Directed For Cp... 4)  Metoprolol Tartrate 100 Mg  Tabs (Metoprolol Tartrate) .... Take 1 Tab By Mouth Two Times A Day... 5)  Lasix 20 Mg  Tabs (Furosemide) .Marland Kitchen.. 1 By Mouth Once Daily 6)  Crestor 20 Mg  Tabs (Rosuvastatin Calcium) .... Take 1 Tab By Mouth At Bedtime.Marland KitchenMarland Kitchen 7)  Niacin Cr 500 Mg  Cpcr (Niacin) .... Take 2 Tabs By Mouth At Bedtime.Marland KitchenMarland Kitchen 8)  Fish Oil Concentrate 1000 Mg Caps (Omega-3 Fatty Acids) .... Take As Directed By [facility] Clinic.Marland KitchenMarland Kitchen 9)  Coq10 100 Mg Caps (Coenzyme Q10) .... Take 1 Capsule By Mouth Once A Day 10)  Metformin Hcl 500 Mg  Tabs (Metformin Hcl) .... 1/2 By Mouth Two Times A Day 11)  Bentyl 10 Mg Caps (Dicyclomine Hcl) .... Take One Capsule By Mouth Two Times A Day 12)  Flaxseed Oil 1000 Mg  Caps (Flaxseed (Linseed)) .Marland Kitchen.. 1 By Mouth Once Daily 13)  Lyrica 75 Mg  Caps (Pregabalin) .... Take 1 Capsule By Mouth Two Times A Day 14)  Caltrate 600+d 600-400 Mg-Unit Tabs (Calcium Carbonate-Vitamin D) .... Take 1 Tab By Mouth Two  Times A Day... 15)  Glucosamine 500 Mg Caps (Glucosamine Sulfate) .... Take 1 Capsule By Mouth Two Times A Day 16)  Multivitamins   Tabs (Multiple Vitamin) .Marland Kitchen.. 1 Tab Once Daily 17)  Vitamin C 500 Mg  Tabs (Ascorbic Acid) .... 2 Tabs Qd 18)  Cvs Vitamin D 1000 Unit Caps (Cholecalciferol) .Marland Kitchen.. 1 Daily. 19)  Cinnamon 500 Mg Caps (Cinnamon) .... Take 1 Capsule By Mouth Two Times A Day 20)  Garlic   Powd (Garlic) .... 500mg  2 Tabs Once Daily 21)  Effexor 37.5 Mg Tabs (Venlafaxine Hcl) .... Take 1 Tab By Mouth Once Daily... 22)  Meclizine Hcl 25 Mg  Tabs (Meclizine Hcl) .Marland Kitchen.. 1 Tab Every 4-6hrs Prn Dizziness 23)  Vicodin 5-500 Mg Tabs (Hydrocodone-Acetaminophen) .... Take 1 Tab By Mouth Three Times A Day As Needed For Pain... 24)  Prilosec Otc 20 Mg Tbec (Omeprazole Magnesium) .... Take 1 Tablet By Mouth Two Times A Day 25)  Bilberry 100 Mg Caps (Bilberry (Vaccinium Myrtillus)) .... Take 1 Capsule By Mouth Two Times A Day 26)  Aloe Vera 470 Mg Caps (Aloe Vera) .... Take 1 Capsule By Mouth Two Times A Day  Allergies: 1)  ! Codeine 2)  Sulfa 3)  Tetracycline 4)  Prednisone  Past History:  Past medical history reviewed for relevance to current acute and chronic problems.  Past Medical History: Reviewed  history from 09/07/2009 and no changes required. HYPERTENSION (ICD-401.9) CORONARY ARTERY DISEASE (ICD-414.00) status post inferior wall MI 2007. PREMATURE VENTRICULAR CONTRACTIONS (ICD-427.69) CEREBROVASCULAR DISEASE (ICD-437.9) MIXED HYPERLIPIDEMIA (ICD-272.2) DIABETES MELLITUS, TYPE II (ICD-250.00) GASTROESOPHAGEAL REFLUX DISEASE (ICD-530.81) DIVERTICULAR DISEASE (ICD-562.10) IBS (ICD-564.1) URINARY INCONTINENCE (ICD-788.30) DEGENERATIVE JOINT DISEASE (ICD-715.90) NECK PAIN, CHRONIC (ICD-723.1) SYNDROME, CHRONIC PAIN (ICD-338.4) DIZZINESS, CHRONIC (ICD-780.4) ANXIETY (ICD-300.00)  Review of Systems       Negative except as per HPI   Vital Signs:  Patient profile:   71 year  old female Height:      61 inches Weight:      147.25 pounds BMI:     27.92 Pulse rate:   60 / minute Pulse rhythm:   regular Resp:     18 per minute BP sitting:   154 / 80  (left arm) Cuff size:   large  Vitals Entered By: Vikki Ports (September 08, 2009 12:10 PM)  Physical Exam  General:  Pt is alert and oriented, in no acute distress. HEENT: normal Neck: normal carotid upstrokes without bruits, JVP normal Lungs: CTA CV: RRR without murmur or gallop Abd: soft, NT, obese, positive BS, no bruit, no organomegaly Ext: no clubbing, cyanosis, or edema. peripheral pulses 2+ and equal Skin: warm and dry without rash    EKG  Procedure date:  09/08/2009  Findings:      NSR, nonspecific Twave abnormality, HR 59 bpm.  Impression & Recommendations:  Problem # 1:  CORONARY ARTERY DISEASE (ICD-414.00) The pt is stable. She has undergone recent cath and stress testing demonstrating a patent RCA stent, moderate mid-LAD stenosis, and no significant ischemia. Continue current medical therapy and add an ARB in setting of TypeII DM with underlying CAD. The pt will be started on cozaar 50 mg daily.  Her updated medication list for this problem includes:    Aspirin 81 Mg Tbec (Aspirin) .Marland Kitchen... Take 1 tab by mouth once daily...    Plavix 75 Mg Tabs (Clopidogrel bisulfate) .Marland Kitchen... 1 by mouth once daily    Nitrostat 0.4 Mg Subl (Nitroglycerin) ..... Use as directed for cp...    Metoprolol Tartrate 100 Mg Tabs (Metoprolol tartrate) .Marland Kitchen... Take 1 tab by mouth two times a day...  Orders: EKG w/ Interpretation (93000)  Problem # 2:  HYPERTENSION, UNSPECIFIED (ICD-401.9) BP control suboptimal. Add Cozaar 50 mg daily. F/U bmet 2-3 weeks.  Her updated medication list for this problem includes:    Aspirin 81 Mg Tbec (Aspirin) .Marland Kitchen... Take 1 tab by mouth once daily...    Metoprolol Tartrate 100 Mg Tabs (Metoprolol tartrate) .Marland Kitchen... Take 1 tab by mouth two times a day...    Lasix 20 Mg Tabs (Furosemide)  .Marland Kitchen... 1 by mouth once daily    Cozaar 50 Mg Tabs (Losartan potassium) .Marland Kitchen... Take one tablet by mouth daily  Orders: EKG w/ Interpretation (93000)  BP today: 154/80 Prior BP: 126/64 (09/07/2009)  Labs Reviewed: K+: 4.3 (08/04/2009) Creat: : 0.7 (08/04/2009)   Chol: 154 (05/10/2009)   HDL: 41.80 (05/10/2009)   LDL: DEL (09/06/2008)   TG: 225.0 (05/10/2009)  Problem # 3:  MIXED HYPERLIPIDEMIA (ICD-272.2) LDL at goal (81 mg/dL), managed by the lipid clinic  Her updated medication list for this problem includes:    Crestor 20 Mg Tabs (Rosuvastatin calcium) .Marland Kitchen... Take 1 tab by mouth at bedtime...    Niacin Cr 500 Mg Cpcr (Niacin) .Marland Kitchen... Take 2 tabs by mouth at bedtime...  CHOL: 154 (05/10/2009)   LDL: DEL (09/06/2008)   HDL:  41.80 (05/10/2009)   TG: 225.0 (05/10/2009) Homocysteine: 7.4 (05/13/2008)  Patient Instructions: 1)  Your physician recommends that you return for lab work in: 2 WEEKS (BMP 401.1, 414.01) 2)  Your physician has recommended you make the following change in your medication: START Cozaar 50mg  once a day 3)  Your physician wants you to follow-up in:   September 2011. You will receive a reminder letter in the mail two months in advance. If you don't receive a letter, please call our office to schedule the follow-up appointment. Prescriptions: COZAAR 50 MG TABS (LOSARTAN POTASSIUM) Take one tablet by mouth daily  #90 x 3   Entered by:   Julieta Gutting, RN, BSN   Authorized by:   Norva Karvonen, MD   Signed by:   Julieta Gutting, RN, BSN on 09/08/2009   Method used:   Electronically to        [facility]  [address] [id number] (retail)       [address]       [place], [place]  [address]       Ph: [phone] or [phone]       Fax: [phone]   RxID:   [id number]

## 2010-10-05 NOTE — Letter (Signed)
Summary: [facility]  [facility]   Imported By: Lester [place] 08/31/2010 09:40:02  _____________________________________________________________________  External Attachment:    Type:   Image     Comment:   External Document

## 2010-10-05 NOTE — Letter (Signed)
Summary: [facility]  [facility]   Imported By: Lester [place] 03/01/2010 08:21:04  _____________________________________________________________________  External Attachment:    Type:   Image     Comment:   External Document

## 2010-10-05 NOTE — Assessment & Plan Note (Signed)
Summary: sur/total knee replacement   Visit Type:  Follow-up Referring Provider:  Dr Eulah Pont Primary Provider:  [name], MD  CC:  Surgical clearance- knee replacement.  History of Present Illness: This is a 70 year old woman with coronary artery disease who had an inferior wall MI in 2007 and was treated with the drug-eluting stent to the right coronary artery.  Followup cath was performed to evaluate recurrent chest pain. This showed a patent RCA stent and stable, moderate LAD stenosis. Follow-up moview stress testing was done to rule out anterior ischemia and the study was negative.  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   She has occasional chest pains under the left breast and fleeting substernal pains. Denies exertional chest pain or pressure. Also complains of chronic shortness of breath. Some swelling noted left leg but she relates this to her knee problem. Also complains of palpitations, mainly when lying on her left side at night.     Current Medications (verified): 1)  Advair Diskus 100-50 Mcg/dose Aepb (Fluticasone-Salmeterol) .Marland Kitchen.. 1 Inhalation Two Times A Day As Directed... 2)  Aspirin 81 Mg  Tbec (Aspirin) .... Take 1 Tab By Mouth Once Daily.Marland KitchenMarland Kitchen 3)  Plavix 75 Mg  Tabs (Clopidogrel Bisulfate) .Marland Kitchen.. 1 By Mouth Once Daily 4)  Nitrostat 0.4 Mg  Subl (Nitroglycerin) .... Use As Directed For Cp... 5)  Metoprolol Tartrate 100 Mg  Tabs (Metoprolol Tartrate) .... Take 1 Tab By Mouth Two Times A Day... 6)  Cozaar 100 Mg Tabs (Losartan Potassium) .... Take One Tablet By Mouth Daily 7)  Lasix 20 Mg  Tabs (Furosemide) .Marland Kitchen.. 1 By Mouth Once Daily 8)  Crestor 20 Mg  Tabs (Rosuvastatin Calcium) .... Take 1 Tab By Mouth At Bedtime.Marland KitchenMarland Kitchen 9)  Niacin Cr 500 Mg  Cpcr (Niacin) .... Take 2 Tabs By Mouth At Bedtime... 10)  Fish Oil Concentrate 1000 Mg Caps (Omega-3 Fatty Acids) .... Take As  Directed By Lipid Clinic...4 Daily 11)  Coq10 100 Mg Caps (Coenzyme Q10) .... Take 1 Capsule By Mouth Once A Day 12)  Metformin Hcl 500 Mg  Tabs (Metformin Hcl) .... 1/2 By Mouth Two Times A Day 13)  Omeprazole 20 Mg Cpdr (Omeprazole) .... 2 Once Daily 14)  Flaxseed Oil 1000 Mg  Caps (Flaxseed (Linseed)) .Marland Kitchen.. 1 By Mouth Two Times A Day 15)  Cod Liver Oil  Oil (Cod Liver Oil) .... Take 2 Daily 16)  Arthrotec 75-200 Mg-Mcg Tabs (Diclofenac-Misoprostol) .... Take One Tablet By Mouth Two Times A Day 17)  Methocarbamol 500 Mg Tabs (Methocarbamol) .... Take 1 Tablet By Mouth Every 6 Hours As Needed For Muscle Spasms 18)  Lyrica 75 Mg  Caps (Pregabalin) .... Take 1 Capsule By Mouth Two Times A Day 19)  Caltrate 600+d 600-400 Mg-Unit Tabs (Calcium Carbonate-Vitamin D) .... Take 1 Tab By Mouth Two Times A Day... 20)  Glucosamine 500 Mg Caps (Glucosamine Sulfate) .... Take 1 Capsule By Mouth Two Times A Day 21)  Multivitamins   Tabs (Multiple Vitamin) .Marland Kitchen.. 1 Tab Once Daily 22)  Vitamin C 500 Mg  Tabs (Ascorbic Acid) .... 2 Tabs Qd 23)  Cvs Vitamin D 1000 Unit Caps (Cholecalciferol) .Marland Kitchen.. 1 Daily. 24)  Cinnamon 500 Mg Caps (Cinnamon) .... Take 1 Capsule By Mouth Two Times A Day 25)  Garlic   Powd (Garlic) .... 500mg  2 Tabs Once Daily 26)  Aloe Vera 470 Mg Caps (Aloe Vera) .... Take 1 Capsule  By Mouth Two Times A Day 27)  Effexor 37.5 Mg Tabs (Venlafaxine Hcl) .... Take 1 Tab By Mouth Once Daily... 28)  Meclizine Hcl 25 Mg  Tabs (Meclizine Hcl) .Marland Kitchen.. 1 Tab Every 4-6hrs Prn Dizziness 29)  Tramadol Hcl 50 Mg Tabs (Tramadol Hcl) .... 1/2 - 1 By Mouth Three Times A Day As Needed Pain 30)  Dicyclomine Hcl 10 Mg Caps (Dicyclomine Hcl) .... Take 1 Tablet By Mouth Two Times A Day  Allergies: 1)  ! Codeine 2)  Sulfa 3)  Tetracycline 4)  Prednisone  Past History:  Past medical history reviewed for relevance to current acute and chronic problems.  Past Medical History: BRONCHITIS, ACUTE  (ICD-466.0) HYPERTENSION (ICD-401.9) CORONARY ARTERY DISEASE (ICD-414.00) status post inferior wall MI 2007, treated with DES. PREMATURE VENTRICULAR CONTRACTIONS (ICD-427.69) CEREBROVASCULAR DISEASE (ICD-437.9) MIXED HYPERLIPIDEMIA (ICD-272.2) DIABETES MELLITUS, TYPE II (ICD-250.00) GASTROESOPHAGEAL REFLUX DISEASE (ICD-530.81) DIVERTICULAR DISEASE (ICD-562.10) IBS (ICD-564.1) URINARY INCONTINENCE (ICD-788.30) DEGENERATIVE JOINT DISEASE (ICD-715.90) NECK PAIN, CHRONIC (ICD-723.1) SYNDROME, CHRONIC PAIN (ICD-338.4) DIZZINESS, CHRONIC (ICD-780.4) ANXIETY (ICD-300.00)  Review of Systems       Negative except as per HPI   Vital Signs:  Patient profile:   70 year old female Height:      61 inches Weight:      152.75 pounds BMI:     28.97 Pulse rate:   68 / minute Pulse rhythm:   regular Resp:     18 per minute BP sitting:   136 / 80  (left arm) Cuff size:   large  Vitals Entered By: Vikki Ports (September 28, 2010 3:15 PM)  Physical Exam  General:  Pt is alert and oriented, in no acute distress. HEENT: normal Neck: normal carotid upstrokes without bruits, JVP normal Lungs: CTA CV: RRR without murmur or gallop Abd: soft, NT, positive BS, no bruit, no organomegaly Ext: no clubbing, cyanosis, or edema. peripheral pulses 2+ and equal Skin: warm and dry without rash    Cardiac Cath  Procedure date:  08/10/2009  Findings:      ASSESSMENT: 1. Moderate left anterior descending artery stenosis as outlined     above. 2. Mild left circumflex stenosis. 3. Widely patent right coronary artery stent. 4. Normal left ventricular systolic function.   PLAN:  The patient's LAD stenosis appears stable from previous.  There may be minor progression of disease.  Recommend a Myoview stress scan to rule out anterior ischemia.  We will continue the patient's current medical program, otherwise.         [name]. Excell Seltzer, MD  Nuclear Study  Procedure date:   08/31/2009  Findings:      QPS  Raw Data Images:  Normal; no motion artifact; normal heart/lung ratio. Stress Images:  NI: Uniform and normal uptake of tracer in all myocardial segments. Rest Images:  Normal homogeneous uptake in all areas of the myocardium. Subtraction (SDS):  Normal Transient Ischemic Dilatation:  1.0  (Normal <1.22)  Lung/Heart Ratio:  .25  (Normal <0.45)  Quantitative Gated Spect Images  QGS EDV:  59 ml QGS ESV:  14 ml QGS EF:  76 % QGS cine images:  Normal  Findings  Low risk nuclear study  Evidence for inferior infarct     Overall Impression   Exercise Capacity: Lexiscan BP Response: Normal blood pressure response. Clinical Symptoms: Warm and Flushed ECG Impression: No significant ST segment change suggestive of ischemia. Overall Impression: Small inferoapical fixed defect seen only on short axis images No ischemia  EKG  Procedure date:  09/28/2010  Findings:      NSR with nonspecific TWA, otherwise within normal limits.  Impression & Recommendations:  Problem # 1:  CORONARY ARTERY DISEASE (ICD-414.00) The patient has stable symptoms. She has had a cardiac cath demonstrating stable CAD within the past year. She also had a low-risk Myoview following the cath. I think she can proceed with knee replacement at low risk of cardiac morbidity. She was treated with a drug-eluting stent at the time of an acute infarction in 2007, so there is a small risk of perioperative stent thrombosis. In general, we have recommended holding plavix perioperatively per usual protocol (5-7 days) and continuing ASA 81 mg if possible. If ASA must be held, it should be resumed as soon as possible following surgery. She should continue on metoprolol without interruption.  Her updated medication list for this problem includes:    Aspirin 81 Mg Tbec (Aspirin) .Marland Kitchen... Take 1 tab by mouth once daily...    Plavix 75 Mg Tabs (Clopidogrel bisulfate) .Marland Kitchen... 1 by mouth once daily     Nitrostat 0.4 Mg Subl (Nitroglycerin) ..... Use as directed for cp...    Metoprolol Tartrate 100 Mg Tabs (Metoprolol tartrate) .Marland Kitchen... Take 1 tab by mouth two times a day...  Orders: EKG w/ Interpretation (93000)  Problem # 2:  HYPERTENSION, UNSPECIFIED (ICD-401.9) Controlled.  Her updated medication list for this problem includes:    Aspirin 81 Mg Tbec (Aspirin) .Marland Kitchen... Take 1 tab by mouth once daily...    Metoprolol Tartrate 100 Mg Tabs (Metoprolol tartrate) .Marland Kitchen... Take 1 tab by mouth two times a day...    Cozaar 100 Mg Tabs (Losartan potassium) .Marland Kitchen... Take one tablet by mouth daily    Lasix 20 Mg Tabs (Furosemide) .Marland Kitchen... 1 by mouth once daily  BP today: 136/80 Prior BP: 155/79 (05/15/2010)  Labs Reviewed: K+: 4.7 (04/20/2010) Creat: : 0.7 (04/20/2010)   Chol: 118 (04/20/2010)   HDL: 31.50 (04/20/2010)   LDL: DEL (09/06/2008)   TG: 332.0 (04/20/2010)  Patient Instructions: 1)  You are cleared from a cardiac standpoint for Orthopaedic Surgery.  2)  Your physician recommends that you continue on your current medications as directed. Please refer to the Current Medication list given to you today. 3)  Your physician wants you to follow-up in:  1 YEAR.  You will receive a reminder letter in the mail two months in advance. If you don't receive a letter, please call our office to schedule the follow-up appointment.

## 2010-10-05 NOTE — Assessment & Plan Note (Signed)
Summary: 2-3 months/apc   Primary Care [name] [name]:  [name] Dust, MD  CC:  3 month ROV & review of mult medical problems....  History of Present Illness: 70 y/o WF here for a follow up visit... he has multiple medical problems as noted below...    Followed by [name] [name] for Cards w/ atyp CP, nonobstructive CAD, HBP, PVCs, Cerebrovasc dis w/ old lacunar infarct, & hyperlipidemia >> see meds below...  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 they started Rx w/ phys therapy.  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 we discussed adding Advair100Bid... she saw [name] [name] for Cards 1/11 and Cozaar added for borderline BP control... also saw [name] [name] for Ortho 1/11 w/ shot in knee & phys therapy... also seen by [name] [name] for NS Rx w/ Arthrotec & muscle relaxer...   ~  April 20, 2010:  she's had bilat cat surg & she's seen [name] [name] (stable [name]), [name] [name] (started pelvic floor PT), and [name] [name] (given shot in knee- needs TKR) over the last 3months... she notes occas wheezing/ SOB, but she's been exerc on a treadmill...  BP stable on meds;  Chol is improved on Crestor but TGs elevated & may need Fibrate;  BS OK on Metformin but A1c not as good w/ weight gain;  we decided to check CXR (NAD), and PFTs (mild restriction)...   Current Problem List:  BRONCHITIS, ACUTE (ICD-466.0) - see above> AB w/ refractory episode 4/11 & we will add ADVAIR 100Bid, to he MUCINEX OTC, & Hydromet cough syrup Prn...  ~  8/11:  she still c/o some  wheezing & wants cough syrup refilled... CXR= clear, NAD; and PFTs show mild restriction only.  HYPERTENSION (ICD-401.9) - controlled on  METOPROLOL 100mg Bid, LOSARTAN 50mg /d, and LASIX 20mg /d... BP= 130/78 and feeling OK- without HA, visual symptoms, CP, palpit, SOB, edema, etc...  CORONARY ARTERY DISEASE (ICD-414.00) - on above + ASA 81mg /d + PLAVIX 75mg /d...  followed by [name] [name] and his notes are reviewed.  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 she had PCI w/ drug eluting stent in RCA...  ~  NuclearStressTest 12/08 showed sm area of ischemia inferolateral wall, norm LVF=70%...  ~  2DEcho 12/08 showed no LV regional wall motion abn, EF=60%, some DD was seen...  ~  saw [name] [name] 11/09- note reviewed... continue same meds, consider re-cath if CP occurs.  ~  recurrent CP 12/10:  Myoview w/ sm inferoapical infarct, no ischemia, EF= 76%;  12/10 cath showed 20%LMain, 60-70%midLAD, 40-50%ostial CIRC, midRCA stent patent w/o restenosis, EF= 65%... stable, no change from 2007, med Rx.  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  CEREBROVASCULAR DISEASE (ICD-437.9) - CT Br 2/09 showed sm vessel disease and old lacunar infarct... on ASA + PLAVIX.  MIXED HYPERLIPIDEMIA (ICD-272.2) - on CRESTOR 20mg /d + NIASPAN 500- 2Qhs + FISH OIL daily... followed in the [facility].  ~  FLP 8/08 on Lipitor80 showed TChol 127, TG 201, HDL 34,  LDL 61  ~  FLP 7/09 showed TChol 129, TG 265, HDL 36, LDL 65... Lipid Clinic may want to consider a fibrate...  ~  FLP 9/09 howed TChol 152, TG 225, HDL 30, LDL 70  ~  FLP 1/10 showed TChol 137, TG 203, HDL 38, LDL 58  ~  FLP 9/10 showed TChol 154, TG 225, HDL 42, LDL 81  ~  FLP 8/11 showed TChol 118, TG 332, HDL 32, LDL 45... may need fibrate.  DIABETES MELLITUS, TYPE II (ICD-250.00) - on METFORMIN 500mg - 1/2 tabBid...  ~  labs 8/08 showed BS= 162, HgA1c= 6.5.Marland KitchenMarland Kitchen  ~   labs 2/09 showed BS= 154...  ~  labs 9/09 (wt=152#) showed BS= 141, HgA1c= 6.1  ~  labs 9/10 (wt=126#) showed BS= 136, A1c= 6.1  ~  labs 8/11 showed BS= 137, A1c= 7.1  GASTROESOPHAGEAL REFLUX DISEASE (ICD-530.81) - last EGD 4/08 showed GERD and deformed pylorus... Rx w/ PPI...  ~  she had a follow up w/ [name] [name] 7/09 for her GERD- on OMEP 20mg Bid, and IBS- on fiber & Bentyl...  DIVERTICULAR DISEASE (ICD-562.10) - last colonoscopy 11/04 by [name] [name] showed divertics only... IBS (ICD-564.1) - on BENTYL 10mg  Bid...  URINARY INCONTINENCE (ICD-788.30) - eval 7/08 by [name] [name]- prev pubovag sling surg 2004... she has persist symptoms and he rec physical therapy...  ~  she has had recurrent UTIs & Rx w/ Cipro...  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  ~  Office f/u 7/11 w/ another shot given- consider TKR.  NECK PAIN, CHRONIC (ICD-723.1) - s/p neck fusion surg x 2 in 2005 and 2006 by [name] [name] w/ a complex situation involving non-union, severe spondylosis, etc... notes from [name] [name] reviewed> on ARTHROTEC 75mg Bid, & METHOCARBAMOL 500mg Qid.  ~  saw [name] [name] 9/10 & 5/11- stable, continue conservative management...  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  DIZZINESS, CHRONIC (ICD-780.4) - full eval by [name] [name] w/ CT/ MRI etc... Dx'd w/ benign positional vertigo and Rx'd w/ Eply manuever as needed.  ANXIETY (ICD-300.00) - on EFFEXOR 37.5mg /d for hot flashes & dizziness (she states this helps)...  ~  Sep09:  she wants her "Factor V checked"... states that her sister & granddaughter see [name] [name] w/ clots and he thought it would be a good idea to check [name] [name] for Factor V Leiden Mutation (she has never had a venous thromboembolic phenomenon)... 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 LABS  9/09 neg for Factor V mutation.   Preventive Screening-Counseling & Management  Alcohol-Tobacco     Smoking Status: never  Caffeine-Diet-Exercise     Caffeine use/day: 1     Does Patient Exercise: yes  Allergies: 1)  ! Codeine 2)  Sulfa 3)  Tetracycline 4)  Prednisone  Comments:  Nurse/Medical Assistant: The patient's medications and allergies were reviewed with the patient and were updated in the Medication and Allergy Lists.  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  Mother with some form of heart ailments.  The patient is  unsure what.  The patient states that she has never known her father.  She has two siblings with known coronary artery disease.  Social History: Reviewed history from 08/03/2009 and no changes required. Patient has never smoked.  Alcohol Use - no Daily Caffeine Use Illicit Drug Use - no Married  Review of Systems      See HPI       The patient complains of weight gain, dyspnea on exertion, and muscle weakness.  The patient denies anorexia, fever, weight loss, vision loss, decreased hearing, hoarseness, chest pain, syncope,  peripheral edema, prolonged cough, headaches, hemoptysis, abdominal pain, melena, hematochezia, severe indigestion/heartburn, hematuria, incontinence, suspicious skin lesions, transient blindness, difficulty walking, depression, unusual weight change, abnormal bleeding, enlarged lymph nodes, and angioedema.    Vital Signs:  Patient profile:   70 year old female Height:      61 inches Weight:      153.50 pounds BMI:     29.11 O2 Sat:      98 % on Room air Temp:     98.3 degrees F oral Pulse rate:   69 / minute BP sitting:   130 / 78  (right arm) Cuff size:   regular  Vitals Entered By: Randell Loop CMA (April 20, 2010 9:53 AM)  O2 Sat at Rest %:  98 O2 Flow:  Room air CC: 3 month ROV & review of mult medical problems... Is Patient Diabetic? No Pain Assessment Patient in pain? yes      Onset of pain  upper back and into shoulder blade---she fell 2 wks ago Comments no changes in meds today    Physical Exam  Additional Exam:  WD, WN, 70 y/o WF in NAD... GENERAL:  Alert & oriented; pleasant & cooperative... HEENT:  [place]/AT, EOM-wnl, PERRLA, EACs-clear, TMs-wnl, NOSE-clear, THROAT-clear & wnl. NECK:  Supple w/ decrROM & scars from surg; no JVD; normal carotid impulses w/o bruits;  no thyromegaly or nodules palpated; no lymphadenopathy. CHEST:  Clear to P & A; without wheezes/ rales/ or rhonchi heard... HEART:  Regular Rhythm; without murmurs/ rubs/ or gallops detected... ABDOMEN:  Soft & nontender; normal bowel sounds; no organomegaly or masses palpated... EXT: without deformities, mod arthritic changes w/ knee crepitus; no varicose veins/ +venous insuffic/ tr edema. NEURO:  CN's intact; no focal neuro deficits... DERM:  No lesions noted; no rash etc...    CXR  Procedure date:  04/20/2010  Findings:      CHEST - 2 VIEW Comparison: Chest radiograph 07/30/2005   Findings: Normal mediastinum and cardiac silhouette.  Costophrenic angles are clear.  No evidence effusion,  infiltrate, or pneumothorax.  Anterior and posterior cervical fixation hardware is noted.  Cholecystectomy clips present.   IMPRESSION: No acute cardiopulmonary process.   Read By:  Genevive Bi,  M.D.    MISC. Report  Procedure date:  04/20/2010  Findings:      BMP (METABOL)   Sodium               [L]  133 mEq/L                   135-145   Potassium                 4.7 mEq/L  3.5-5.1   Chloride             [L]  94 mEq/L                    96-112   Carbon Dioxide       [H]  33 mEq/L                    19-32   Glucose              [H]  137 mg/dL                   04-54   BUN                       7 mg/dL                     0-98   Creatinine                0.7 mg/dL                   1.1-9.1   Calcium                   9.0 mg/dL                   4.7-82.9   GFR                       87.81 mL/min                >60  Hepatic/Liver Function Panel (HEPATIC)   Total Bilirubin           0.5 mg/dL                   5.6-2.1   Direct Bilirubin          0.2 mg/dL                   3.0-8.6   Alkaline Phosphatase      71 U/L                      39-117   AST                       25 U/L                      0-37   ALT                       29 U/L                      0-35   Total Protein             6.6 g/dL                    5.7-8.4   Albumin                   4.0 g/dL                    6.9-6.2  CBC Platelet w/Diff (CBCD)   White Cell Count          7.6 K/uL  4.5-10.5   Red Cell Count            4.16 Mil/uL                 3.87-5.11   Hemoglobin                13.9 g/dL                   40.1-02.7   Hematocrit                40.2 %                      36.0-46.0   MCV                       96.5 fl                     78.0-100.0   Platelet Count            291.0 K/uL                  150.0-400.0   Neutrophil %              48.2 %                      43.0-77.0   Lymphocyte %              39.6 %                      12.0-46.0   Monocyte %                 8.3 %                       3.0-12.0   Eosinophils%              3.0 %                       0.0-5.0   Basophils %               0.9 %                       0.0-3.0  Comments:      Lipid Panel (LIPID)   Cholesterol               118 mg/dL                   2-536   Triglycerides        [H]  332.0 mg/dL                 6.4-403.4   HDL                  [L]  74.25 mg/dL                 >95.63  Cholesterol LDL - Direct                             45.2 mg/dL  TSH (TSH)   FastTSH                   2.28 uIU/mL  0.35-5.50  Hemoglobin A1C (A1C)   Hemoglobin A1C       [H]  7.1 %                       4.6-6.5   Pulmonary Function Test Date: 04/20/2010 10:37 AM Gender: Female  Pre-Spirometry FVC    Value: 1.69 L/min   % Pred: 65 % FEV1    Value: 1.30 L     Pred: 1.96 L     % Pred: 66.60 % FEV1/FVC  Value: 77.22 %     % Pred: 101.70 %  Impression & Recommendations:  Problem # 1:  BRONCHITIS, ACUTE (ICD-466.0) She wants cough syrup refilled> rec to continue Advair + Mucinex, etc... The following medications were removed from the medication list:    Cipro 250 Mg Tabs (Ciprofloxacin hcl) .Marland Kitchen... Take as directed Her updated medication list for this problem includes:    Advair Diskus 100-50 Mcg/dose Aepb (Fluticasone-salmeterol) .Marland Kitchen... 1 inhalation two times a day as directed...  Orders: Spirometry w/Graph (94010) T-2 View CXR (71020TC)  Problem # 2:  HYPERTENSION, UNSPECIFIED (ICD-401.9) Controlled>  same meds. Her updated medication list for this problem includes:    Metoprolol Tartrate 100 Mg Tabs (Metoprolol tartrate) .Marland Kitchen... Take 1 tab by mouth two times a day...    Cozaar 50 Mg Tabs (Losartan potassium) .Marland Kitchen... Take one tablet by mouth daily    Lasix 20 Mg Tabs (Furosemide) .Marland Kitchen... 1 by mouth once daily  Orders: T-2 View CXR (71020TC) TLB-BMP (Basic Metabolic Panel-BMET) (80048-METABOL) TLB-Hepatic/Liver Function Pnl (80076-HEPATIC) TLB-CBC Platelet -  w/Differential (85025-CBCD) TLB-Lipid Panel (80061-LIPID) TLB-TSH (Thyroid Stimulating Hormone) (84443-TSH) TLB-A1C / Hgb A1C (Glycohemoglobin) (83036-A1C)  Problem # 3:  CORONARY ARTERY DISEASE (ICD-414.00) Stable Cardiac> followed by [name] [name], continue same meds. Her updated medication list for this problem includes:    Aspirin 81 Mg Tbec (Aspirin) .Marland Kitchen... Take 1 tab by mouth once daily...    Plavix 75 Mg Tabs (Clopidogrel bisulfate) .Marland Kitchen... 1 by mouth once daily    Nitrostat 0.4 Mg Subl (Nitroglycerin) ..... Use as directed for cp...    Metoprolol Tartrate 100 Mg Tabs (Metoprolol tartrate) .Marland Kitchen... Take 1 tab by mouth two times a day...    Cozaar 50 Mg Tabs (Losartan potassium) .Marland Kitchen... Take one tablet by mouth daily    Lasix 20 Mg Tabs (Furosemide) .Marland Kitchen... 1 by mouth once daily  Orders: T-2 View CXR (71020TC)  Problem # 4:  CEREBROVASCULAR DISEASE (ICD-437.9) Continue ASA + Plavix...  Problem # 5:  MIXED HYPERLIPIDEMIA (ICD-272.2) May need Fibrate added but I rec> better low fat diet & get wt down several lbs... Her updated medication list for this problem includes:    Crestor 20 Mg Tabs (Rosuvastatin calcium) .Marland Kitchen... Take 1 tab by mouth at bedtime...    Niacin Cr 500 Mg Cpcr (Niacin) .Marland Kitchen... Take 2 tabs by mouth at bedtime...  Problem # 6:  DIABETES MELLITUS, TYPE II (ICD-250.00) Stable on Metformin but may need incr dose> for now get weight down... Her updated medication list for this problem includes:    Aspirin 81 Mg Tbec (Aspirin) .Marland Kitchen... Take 1 tab by mouth once daily...    Cozaar 50 Mg Tabs (Losartan potassium) .Marland Kitchen... Take one tablet by mouth daily    Metformin Hcl 500 Mg Tabs (Metformin hcl) .Marland Kitchen... 1/2 by mouth two times a day  Problem # 7:  DIVERTICULAR DISEASE (ICD-562.10) GI is stable>  same meds.  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 Her updated medication list for this  problem includes:    Aspirin 81 Mg Tbec (Aspirin) .Marland Kitchen... Take 1 tab by mouth once daily...    Arthrotec 75-200 Mg-mcg Tabs (Diclofenac-misoprostol) .Marland Kitchen... 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  Complete Medication List: 1)  Advair Diskus 100-50 Mcg/dose Aepb (Fluticasone-salmeterol) .Marland Kitchen.. 1 inhalation two times a day as directed... 2)  Aspirin 81 Mg Tbec (Aspirin) .... Take 1 tab by mouth once daily.Marland KitchenMarland Kitchen 3)  Plavix 75 Mg Tabs (Clopidogrel bisulfate) .Marland Kitchen.. 1 by mouth once daily 4)  Nitrostat 0.4 Mg Subl (Nitroglycerin) .... Use as directed for cp... 5)  Metoprolol Tartrate 100 Mg Tabs (Metoprolol tartrate) .... Take 1 tab by mouth two times a day... 6)  Cozaar 50 Mg Tabs (Losartan potassium) .... Take one tablet by mouth daily 7)  Lasix 20 Mg Tabs (Furosemide) .Marland Kitchen.. 1 by mouth once daily 8)  Crestor 20 Mg Tabs (Rosuvastatin calcium) .... Take 1 tab by mouth at bedtime.Marland KitchenMarland Kitchen 9)  Niacin Cr 500 Mg Cpcr (Niacin) .... Take 2 tabs by mouth at bedtime... 10)  Fish Oil Concentrate 1000 Mg Caps (Omega-3 fatty acids) .... Take as directed by lipid clinic...4 daily 11)  Coq10 100 Mg Caps (Coenzyme q10) .... Take 1 capsule by mouth once a day 12)  Metformin Hcl 500 Mg Tabs (Metformin hcl) .... 1/2 by mouth two times a day 13)  Prilosec Otc 20 Mg Tbec (Omeprazole magnesium) .... Take 1 tablet by mouth two times a day 14)  Bentyl 10 Mg Caps (Dicyclomine hcl) .... Take one capsule by mouth two times a day 15)  Flaxseed Oil 1000 Mg Caps (Flaxseed (linseed)) .Marland Kitchen.. 1 by mouth two times a day 16)  Cod Liver Oil Oil (Cod liver oil) .... Take 2 daily 17)  Arthrotec 75-200 Mg-mcg Tabs (Diclofenac-misoprostol) .... Take one tablet by mouth two times a day 18)  Methocarbamol 500 Mg Tabs (Methocarbamol) .... Take 1 tablet by mouth every 6 hours as needed for muscle spasms 19)  Lyrica 75 Mg Caps (Pregabalin) .... Take 1 capsule by mouth two times a  day 20)  Caltrate 600+d 600-400 Mg-unit Tabs (Calcium carbonate-vitamin d) .... Take 1 tab by mouth two times a day... 21)  Glucosamine 500 Mg Caps (Glucosamine sulfate) .... Take 1 capsule by mouth two times a day 22)  Multivitamins Tabs (Multiple vitamin) .Marland Kitchen.. 1 tab once daily 23)  Vitamin C 500 Mg Tabs (Ascorbic acid) .... 2 tabs qd 24)  [facility] Vitamin D 1000 Unit Caps (Cholecalciferol) .Marland Kitchen.. 1 daily. 25)  Cinnamon 500 Mg Caps (Cinnamon) .... Take 1 capsule by mouth two times a day 26)  Bilberry 100 Mg Caps (Bilberry (vaccinium myrtillus)) .... Take 1 capsule by mouth two times a day 27)  Garlic Powd (Garlic) .... 500mg  2 tabs once daily 28)  Aloe Vera 470 Mg Caps (Aloe vera) .... Take 1 capsule by mouth two times a day 29)  Effexor 37.5 Mg Tabs (Venlafaxine hcl) .... Take 1 tab by mouth once daily... 30)  Meclizine Hcl 25 Mg Tabs (Meclizine hcl) .Marland Kitchen.. 1 tab every 4-6hrs prn dizziness  Patient Instructions: 1)  Today we updated your med list- see below.... 2)  Today we did your follow up CXR & FASTING blood work... please call the "phone tree" in a few days for your lab results.Marland KitchenMarland Kitchen 3)  Concentrate on getting  good deep breaths to expand your lungs well, & continue the Advair twice daily.Marland KitchenMarland Kitchen  4)  Stay as active as possible... 5)  Call for any problems.Marland KitchenMarland Kitchen 6)  Please schedule a follow-up appointment in 6 months, sooner as needed.   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 PREMATURE VENTRICULAR CONTRACTIONS (ICD-427.69) CEREBROVASCULAR DISEASE (ICD-437.9) MIXED HYPERLIPIDEMIA  (ICD-272.2) DIABETES MELLITUS, TYPE II (ICD-250.00) GASTROESOPHAGEAL REFLUX DISEASE (ICD-530.81) DIVERTICULAR DISEASE (ICD-562.10) IBS (ICD-564.1) URINARY INCONTINENCE (ICD-788.30) DEGENERATIVE JOINT DISEASE (ICD-715.90) NECK PAIN, CHRONIC (ICD-723.1) SYNDROME, CHRONIC PAIN (ICD-338.4) DIZZINESS, CHRONIC (ICD-780.4) ANXIETY (ICD-300.00)  Recorded: 04/20/2010 10:37 AM  Parameter  Measured Predicted %Predicted FVC     1.69        2.59        65 FEV1     1.30        1.96        66.60 FEV1%   77.22        75.93        101.70 PEF    3.68        5.14        71.50   Interpretation:

## 2010-10-05 NOTE — Progress Notes (Signed)
Summary: prescript  Phone Note Call from Patient   Caller: Spouse gene Call For: nadel Summary of Call: need refill for transderm scope 1.5mg  patch [facility] Initial call taken by: Rickard Patience,  July 14, 2010 2:14 PM  Follow-up for Phone Call        called and spoke with pt and she stated that she is flying to [place] with her daughter and had the patches before and wanted to get these refilled.Marland KitchenMarland KitchenBlanche Swanner does have meclizine for dizziness but thought that the patches would work better for her.  please advise if ok to send this med in.  thanks Randell Loop CMA  July 14, 2010 2:35 PM   per TP: okay for transderm patch 1.5mg .  apply 1 patch behind ear every 3 days as needed motion sickness.  #2 boxes.  0 refills. Boone Master CNA/MA  July 14, 2010 5:10 PM   Additional Follow-up for Phone Call Additional follow up Details #1::        Rx was sent and pt aware. Additional Follow-up by: Vernie Murders,  July 14, 2010 5:14 PM    New/Updated Medications: TRANSDERM-SCOP 1.5 MG PT72 (SCOPOLAMINE BASE) 1 behind ear every 3 days as needed Prescriptions: TRANSDERM-SCOP 1.5 MG PT72 (SCOPOLAMINE BASE) 1 behind ear every 3 days as needed  #2 x 0   Entered by:   Vernie Murders   Authorized by:   Rubye Oaks NP   Signed by:   Vernie Murders on 07/14/2010   Method used:   Electronically to        [id number]       [address]       [place], [place]  [address]       Ph: [phone] or [phone]       Fax: [phone]   RxID:   [id number]

## 2010-10-05 NOTE — Progress Notes (Signed)
Summary: pt needs refill  Phone Note Refill Request Call back at Home Phone [phone] Message from:  Patient  Refills Requested: Medication #1:  NITROSTAT 0.4 MG  SUBL use as directed for CP... Initial call taken by: Omer Jack,  September 12, 2009 2:39 PM  Follow-up for Phone Call        Rx faxed to pharmacy Follow-up by: Vikki Ports,  September 12, 2009 3:09 PM    Prescriptions: NITROSTAT 0.4 MG  SUBL (NITROGLYCERIN) use as directed for CP...  #25 x 2   Entered by:   Vikki Ports   Authorized by:   Marland Kitchen Rxrefill - [place]   Signed by:   Vikki Ports on 09/12/2009   Method used:   [facility]ed to ...       [id number]       [address]       [place], [place]  [address]       Ph: [phone] or [phone]       Fax: [phone]   RxID:   [id number]

## 2010-10-09 ENCOUNTER — Encounter: Payer: Self-pay | Admitting: Pulmonary Disease

## 2010-10-09 ENCOUNTER — Ambulatory Visit (INDEPENDENT_AMBULATORY_CARE_PROVIDER_SITE_OTHER): Payer: Self-pay | Admitting: Pulmonary Disease

## 2010-10-09 DIAGNOSIS — K219 Gastro-esophageal reflux disease without esophagitis: Secondary | ICD-10-CM

## 2010-10-09 DIAGNOSIS — I2510 Atherosclerotic heart disease of native coronary artery without angina pectoris: Secondary | ICD-10-CM

## 2010-10-09 DIAGNOSIS — I679 Cerebrovascular disease, unspecified: Secondary | ICD-10-CM

## 2010-10-09 DIAGNOSIS — E119 Type 2 diabetes mellitus without complications: Secondary | ICD-10-CM

## 2010-10-09 DIAGNOSIS — I4949 Other premature depolarization: Secondary | ICD-10-CM

## 2010-10-09 DIAGNOSIS — I10 Essential (primary) hypertension: Secondary | ICD-10-CM

## 2010-10-09 DIAGNOSIS — Z8679 Personal history of other diseases of the circulatory system: Secondary | ICD-10-CM

## 2010-10-09 DIAGNOSIS — E782 Mixed hyperlipidemia: Secondary | ICD-10-CM

## 2010-10-13 ENCOUNTER — Other Ambulatory Visit: Payer: Self-pay | Admitting: Pulmonary Disease

## 2010-10-13 ENCOUNTER — Other Ambulatory Visit: Payer: Medicare Other

## 2010-10-13 ENCOUNTER — Encounter (INDEPENDENT_AMBULATORY_CARE_PROVIDER_SITE_OTHER): Payer: Self-pay | Admitting: *Deleted

## 2010-10-13 DIAGNOSIS — R748 Abnormal levels of other serum enzymes: Secondary | ICD-10-CM

## 2010-10-13 DIAGNOSIS — I10 Essential (primary) hypertension: Secondary | ICD-10-CM

## 2010-10-13 DIAGNOSIS — E7800 Pure hypercholesterolemia, unspecified: Secondary | ICD-10-CM

## 2010-10-13 DIAGNOSIS — D649 Anemia, unspecified: Secondary | ICD-10-CM

## 2010-10-13 DIAGNOSIS — E039 Hypothyroidism, unspecified: Secondary | ICD-10-CM

## 2010-10-13 DIAGNOSIS — E119 Type 2 diabetes mellitus without complications: Secondary | ICD-10-CM

## 2010-10-13 DIAGNOSIS — E785 Hyperlipidemia, unspecified: Secondary | ICD-10-CM

## 2010-10-13 LAB — CBC WITH DIFFERENTIAL/PLATELET
Basophils Relative: 0.9 % (ref 0.0–3.0)
Eosinophils Relative: 2.2 % (ref 0.0–5.0)
HCT: 41.7 % (ref 36.0–46.0)
Hemoglobin: 14.2 g/dL (ref 12.0–15.0)
Lymphs Abs: 3.1 10*3/uL (ref 0.7–4.0)
MCV: 95.2 fl (ref 78.0–100.0)
Monocytes Absolute: 0.6 10*3/uL (ref 0.1–1.0)
Monocytes Relative: 8 % (ref 3.0–12.0)
Neutro Abs: 4 10*3/uL (ref 1.4–7.7)
WBC: 8 10*3/uL (ref 4.5–10.5)

## 2010-10-13 LAB — BASIC METABOLIC PANEL
BUN: 15 mg/dL (ref 6–23)
Calcium: 9.2 mg/dL (ref 8.4–10.5)
Chloride: 98 mEq/L (ref 96–112)
Creatinine, Ser: 0.7 mg/dL (ref 0.4–1.2)
GFR: 82.24 mL/min (ref >60.00)
Glucose, Bld: 192 mg/dL — ABNORMAL HIGH (ref 70–99)
Potassium: 3.8 mEq/L (ref 3.5–5.1)

## 2010-10-13 LAB — LIPID PANEL
Cholesterol: 134 mg/dL (ref 0–200)
HDL: 33.4 mg/dL — ABNORMAL LOW (ref >39.00)
Total CHOL/HDL Ratio: 4
Triglycerides: 257 mg/dL — ABNORMAL HIGH (ref 0.0–149.0)
VLDL: 51.4 mg/dL — ABNORMAL HIGH (ref 0.0–40.0)

## 2010-10-13 LAB — HEPATIC FUNCTION PANEL
ALT: 23 U/L (ref 0–35)
AST: 19 U/L (ref 0–37)
Alkaline Phosphatase: 65 U/L (ref 39–117)
Bilirubin, Direct: 0.1 mg/dL (ref 0.0–0.3)
Total Bilirubin: 0.6 mg/dL (ref 0.3–1.2)
Total Protein: 6.4 g/dL (ref 6.0–8.3)

## 2010-10-17 ENCOUNTER — Encounter: Payer: Self-pay | Admitting: Pulmonary Disease

## 2010-10-19 ENCOUNTER — Encounter: Payer: Self-pay | Admitting: Pulmonary Disease

## 2010-10-23 ENCOUNTER — Ambulatory Visit: Payer: Self-pay | Admitting: Pulmonary Disease

## 2010-10-25 NOTE — Assessment & Plan Note (Signed)
Summary: surgical clearance   Primary Care Provider:  [name], MD  CC:  6 month ROV & review of mult medical problems + surgical clearance for TKR....  History of Present Illness: 70 y/o WF here for a follow up visit... he has multiple medical problems as noted below...      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 they started Rx w/ phys therapy.    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   ~  April 20, 2010:  she's had bilat cat surg & she's seen Dr Jaslynne East (stable [name]), DrTannenbaum (started pelvic floor PT), and DrMurphy (given shot in knee- needs TKR) over the last 3months... she notes occas wheezing/ SOB, but she's been exerc on a treadmill...  BP stable on meds;  Chol is improved on Crestor but TGs elevated & may need Fibrate;  BS OK on Metformin but A1c not as good w/ weight gain;  we decided to check CXR (NAD), and PFTs (mild restriction)...   ~  October 09, 2010:  she is sched for left TKR by DrMurphy next month & needs medical clearance> already seen by DrCooper 1/12 & given the OK from Cards standpoint... she has continued neck pain & HA issues> followed by Carles Collet but she notes that Arthrotec, Tramadol, Methocarbamol, Lyrica - all w/o help for her recent post neck/ head pain... we decided to change the Tramadol to Vicodin (lim 3 daily) & see if this works better... breathing is stable on Advair; BP controlled on current med rx;  denies CP, palpit, SOB, cerebral ischemic symptoms,  etc; she will ret to lab for fasting blood work...   Current Problem List:  Hx of BRONCHITIS (ICD-466.0) - hx AB w/ refractory episode 4/11 & we added ADVAIR 100Bid, MUCINEX OTC, & Hydromet cough syrup Prn...  ~  8/11:  she still c/o some wheezing & wants cough syrup refilled... CXR= clear, NAD; and PFTs show mild restriction only.  HYPERTENSION (ICD-401.9) - controlled on  METOPROLOL 100mg Bid, LOSARTAN 50mg /d, and LASIX 20mg /d... BP= 138/76 and feeling OK- without HA, visual symptoms, CP, palpit, SOB, edema, etc...  CORONARY ARTERY DISEASE (ICD-414.00) - on above + ASA 81mg /d + PLAVIX 75mg /d...  followed by DrCooper and his notes are reviewed.  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 she had PCI w/ drug eluting stent in RCA...  ~  NuclearStressTest 12/08 showed sm area of ischemia inferolateral wall, norm LVF=70%...  ~  2DEcho 12/08 showed no LV regional wall motion abn, EF=60%, some DD was seen...  ~  saw DrCooper 11/09- note reviewed... continue same meds, consider re-cath if CP occurs.  ~  recurrent CP 12/10:  Myoview w/ sm inferoapical infarct, no ischemia, EF= 76%;  12/10 cath showed 20%LMain, 60-70%midLAD, 40-50%ostial CIRC, midRCA stent patent w/o restenosis, EF= 65%... stable, no change from 2007, med Rx.  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  CEREBROVASCULAR DISEASE (ICD-437.9) -  CT Br 2/09 showed sm vessel disease and old lacunar infarct... on ASA + PLAVIX.  MIXED HYPERLIPIDEMIA (ICD-272.2) - on CRESTOR 20mg /d + NIASPAN 500- 2Qhs + FISH OIL daily...  ~  FLP 8/08 on Lipitor80 showed TChol 127, TG 201, HDL 34, LDL 61  ~  FLP 7/09 showed TChol 129, TG 265, HDL 36, LDL 65... Lipid Clinic may want to consider a fibrate...  ~  FLP 9/09 howed TChol 152, TG 225, HDL 30, LDL 70  ~  FLP 1/10 showed TChol 137, TG 203, HDL 38, LDL 58  ~  FLP 9/10 showed TChol 154, TG 225, HDL  42, LDL 81  ~  FLP 8/11 showed TChol 118, TG 332, HDL 32, LDL 45... may need fibrate.  ~  FLP 2/12 showed TChol 134, TG 257, HDL 33, LDL 58... needs better low fat diet!  DIABETES MELLITUS, TYPE II (ICD-250.00) - on METFORMIN 500mg - 1/2 tabBid...  ~  labs 8/08 showed BS= 162, HgA1c= 6.5.Marland KitchenMarland Kitchen  ~  labs 2/09 showed BS= 154...  ~  labs 9/09 (wt=152#) showed BS= 141, HgA1c= 6.1  ~  labs 9/10 (wt=126#) showed BS= 136, A1c= 6.1  ~  labs 8/11 showed BS= 137, A1c= 7.1  ~  labs 2/12 showed BS= 192, A1c= 7.5.Marland KitchenMarland Kitchen incr Metform500Bid, Add Glimep1mg /d.  GASTROESOPHAGEAL REFLUX DISEASE (ICD-530.81) - last EGD 4/08 showed GERD and deformed pylorus... Rx w/ PPI...  ~  she had a follow up w/ DrPatterson 7/09 for her GERD- on OMEP 20mg Bid, and IBS- on fiber & Bentyl...  DIVERTICULAR DISEASE (ICD-562.10) - last colonoscopy 11/04 by DrSam showed divertics only... IBS (ICD-564.1) - on BENTYL 10mg  Tid Prn...  URINARY INCONTINENCE (ICD-788.30) - eval 7/08 by DrTannenbaum- prev pubovag sling surg 2004... she has persist symptoms and he rec physical therapy...  ~  she has had recurrent UTIs & Rx w/ Cipro...  DEGENERATIVE JOINT DISEASE (ICD-715.90)   **SEE ABOVE**  ~  ER eval 12/10 for left knee pain- XRay w/ DJD, sm effusion- refer to Ortho, DrMurphy for shot (helped).  ~  Office f/u 7/11 w/ another shot given- consider TKR.  ~  she is sched for left TKR 3/12 by DrMurphy.  NECK PAIN, CHRONIC (ICD-723.1) - s/p neck fusion surg x 2 in 2005 and 2006 by DrHirsh w/ a complex situation involving non-union, severe spondylosis, etc... notes from DrHirsh reviewed> on ARTHROTEC 75mg Bid, & METHOCARBAMOL 500mg Qid.  ~  saw DrHirsh 9/10 & 5/11- stable, continue conservative management...  ~  2/12:  OV c/o some incr neck discomfort & HA> rec VICODIN up to 3/d as needed.  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  DIZZINESS, CHRONIC (ICD-780.4) - full eval by DrSethi w/  CT/ MRI etc... Dx'd w/ benign positional vertigo and Rx'd w/ Eply manuever as needed.  ANXIETY (ICD-300.00) - on EFFEXOR 37.5mg /d for hot flashes & dizziness (she states this helps)...  ~  Sep09:  she wants her "Factor V checked"... states that her sister & granddaughter see DrEnnever w/ clots and he thought it would be a good idea to check Mykayla for Factor V Leiden Mutation (she has never had a venous thromboembolic phenomenon)... 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 LABS 9/09 neg for Factor V mutation.   Current Medications (verified): 1)  Advair Diskus 100-50 Mcg/dose Aepb (Fluticasone-Salmeterol) .Marland Kitchen.. 1 Inhalation Two Times A Day As Directed... 2)  Aspirin 81 Mg  Tbec (Aspirin) .... Take 1 Tab By Mouth Once Daily.Marland KitchenMarland Kitchen 3)  Plavix 75 Mg  Tabs (Clopidogrel Bisulfate) .Marland Kitchen.. 1 By Mouth Once Daily 4)  Nitrostat 0.4 Mg  Subl (Nitroglycerin) .... Use As Directed For Cp... 5)  Metoprolol Tartrate 100 Mg  Tabs (Metoprolol Tartrate) .... Take 1 Tab By Mouth Two Times A Day... 6)  Cozaar 100 Mg Tabs (Losartan Potassium) .... Take One Tablet By Mouth Daily 7)  Lasix 20 Mg  Tabs (Furosemide) .Marland Kitchen.. 1 By Mouth Once Daily 8)  Crestor 20 Mg  Tabs (Rosuvastatin Calcium) .... Take 1 Tab By Mouth At Bedtime.Marland KitchenMarland Kitchen 9)  Niacin Cr 500 Mg  Cpcr (Niacin) .... Take 2 Tabs By Mouth At Bedtime... 10)  Fish Oil Concentrate 1000 Mg Caps (Omega-3 Fatty Acids) .... Take As Directed By Lipid Clinic...4 Daily 11)  Coq10 100 Mg Caps (Coenzyme Q10) .... Take 1 Capsule By Mouth Once A Day 12)  Metformin Hcl 500 Mg  Tabs (Metformin Hcl) .... 1/2 By Mouth Two Times A Day 13)  Omeprazole 20 Mg Cpdr (Omeprazole) .... 2 Once Daily 14)  Flaxseed Oil 1000 Mg  Caps (Flaxseed (Linseed)) .... Takes 1300mg  1 By Mouth Two Times A Day 15)  Cod Liver Oil  Oil (Cod Liver Oil) .... Take 2 Daily 16)  Arthrotec 75-200 Mg-Mcg Tabs (Diclofenac-Misoprostol) .... Take One Tablet By Mouth Two  Times A Day 17)  Methocarbamol 500 Mg Tabs (Methocarbamol) .... Take 1 Tablet By Mouth Every 6 Hours As Needed For Muscle Spasms 18)  Lyrica 75 Mg  Caps (Pregabalin) .... Take 1 Capsule By Mouth Two Times A Day 19)  Caltrate 600+d 600-400 Mg-Unit Tabs (Calcium Carbonate-Vitamin D) .... Take 1 Tab By Mouth Two Times A Day... 20)  Glucosamine 500 Mg Caps (Glucosamine Sulfate) .... Take 1 Capsule By Mouth Two Times A Day 21)  Multivitamins   Tabs (Multiple Vitamin) .Marland Kitchen.. 1 Tab Once Daily 22)  Vitamin C 500 Mg  Tabs (Ascorbic Acid) .... 2 Tabs Qd 23)  Cvs Vitamin D 1000 Unit Caps (Cholecalciferol) .Marland Kitchen.. 1 Daily. 24)  Cinnamon 500 Mg Caps (Cinnamon) .... Take 1 Capsule By Mouth Two Times A Day 25)  Garlic   Powd (Garlic) .... 1000mg  2 Tabs Once Daily 26)  Aloe Vera 470 Mg Caps (Aloe Vera) .... Take 1 Capsule By Mouth Two Times A Day 27)  Effexor 37.5 Mg Tabs (Venlafaxine Hcl) .... Take 1 Tab By Mouth Once Daily... 28)  Meclizine Hcl 25 Mg  Tabs (Meclizine Hcl) .Marland Kitchen.. 1 Tab Every 4-6hrs Prn Dizziness 29)  Tramadol Hcl 50 Mg Tabs (Tramadol Hcl) .... 1/2 - 1 By Mouth Three Times A Day As Needed Pain 30)  Dicyclomine Hcl 10 Mg Caps (Dicyclomine Hcl) .... Take 1 Tablet By Mouth Two Times A Day  Allergies (verified): 1)  ! Codeine 2)  Sulfa 3)  Tetracycline 4)  Prednisone  Past History:  Past Medical History: BRONCHITIS, ACUTE (ICD-466.0) HYPERTENSION (ICD-401.9) CORONARY ARTERY DISEASE (ICD-414.00) status post inferior wall MI 2007, treated with DES. 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  Mother with some  form of heart ailments.  The patient is  unsure what.  The patient states that she has never known her father.  She has two siblings with known coronary artery disease.  Social History: Reviewed history from 08/03/2009 and no changes required. Patient has never smoked.  Alcohol Use - no Daily Caffeine Use Illicit Drug Use - no Married  Review of Systems      See HPI       The patient complains of dyspnea on exertion, headaches, and depression.  The patient denies anorexia, fever, weight loss, weight gain, vision loss, decreased hearing, hoarseness, chest pain, syncope, peripheral edema, prolonged cough, hemoptysis, abdominal pain, melena, hematochezia, severe indigestion/heartburn, hematuria, incontinence, muscle weakness, suspicious skin lesions, transient blindness, difficulty walking, unusual weight change, abnormal bleeding, enlarged lymph nodes, and angioedema.    Vital Signs:  Patient profile:   70 year old female Height:      61 inches Weight:      154 pounds O2 Sat:      95 % on Room air Temp:     97.8 degrees F oral Pulse rate:   69 / minute BP sitting:   138 / 76  (left arm) Cuff size:   regular  Vitals Entered By: Carver Fila (October 09, 2010 3:49 PM)  O2 Flow:  Room air CC: 6 month ROV & review of mult medical problems + surgical clearance for TKR... Comments meds and allergies updated Phone number updated Carver Fila  October 09, 2010 3:50 PM    Physical Exam  Additional Exam:  WD, WN, 70 y/o WF in NAD... GENERAL:  Alert & oriented; pleasant & cooperative... HEENT:  [place]/AT, EOM-wnl, PERRLA, EACs-clear, TMs-wnl, NOSE-clear, THROAT-clear & wnl. NECK:  Supple w/ decrROM & scars  from surg; no JVD; normal carotid impulses w/o bruits;  no thyromegaly or nodules palpated; no lymphadenopathy. CHEST:  Clear to P & A; without wheezes/ rales/ or rhonchi heard... HEART:  Regular Rhythm; without murmurs/ rubs/ or gallops detected... ABDOMEN:  Soft & nontender; normal bowel sounds; no organomegaly or masses palpated... EXT: without deformities, mod arthritic changes w/ knee crepitus; no varicose veins/ +venous insuffic/ tr edema. NEURO:  CN's intact; no focal neuro deficits... DERM:  No lesions noted; no rash etc...    Impression & Recommendations:  Problem # 1:  MEDICAL CLEARANCE FOR LEFT TKR SURG>>> OK for surg...  Problem # 2:  HYPERTENSION, UNSPECIFIED (ICD-401.9) BP controlled on meds>  continue same. Her updated medication list for this problem includes:    Metoprolol Tartrate 100 Mg Tabs (Metoprolol tartrate) .Marland Kitchen... Take 1 tab by mouth two times a day...    Cozaar 100 Mg Tabs (Losartan potassium) .Marland Kitchen... Take one tablet by mouth daily    Lasix 20 Mg Tabs (Furosemide) .Marland Kitchen... 1 by mouth once daily  Problem # 3:  CORONARY ARTERY DISEASE (ICD-414.00) Followed by DrCooper & stable, no angina, and cleared for surg... Her updated medication list for this problem includes:    Aspirin 81 Mg Tbec (Aspirin) .Marland Kitchen... Take 1 tab by mouth once daily...    Plavix 75 Mg Tabs (Clopidogrel bisulfate) .Marland Kitchen... 1 by mouth once daily    Nitrostat 0.4 Mg Subl (Nitroglycerin) ..... Use as directed for cp...    Metoprolol Tartrate 100 Mg Tabs (Metoprolol tartrate) .Marland Kitchen... Take 1 tab by mouth two times a day...    Cozaar 100 Mg Tabs (Losartan potassium) .Marland Kitchen... Take one tablet by mouth daily    Lasix 20 Mg Tabs (Furosemide) .Marland KitchenMarland KitchenMarland KitchenMarland Kitchen  1 by mouth once daily  Problem # 4:  CEREBROVASCULAR DISEASE (ICD-437.9) Stable on ASA & Plavix>  OK to hold these 5-7d prior to the TKR surg & restart ASAP post op...  Problem # 5:  MIXED HYPERLIPIDEMIA (ICD-272.2) Follow up FLP shows improved but still w/ incr TG &  low HDL.Marland Kitchen. needs better low fat diet, same meds for now... Her updated medication list for this problem includes:    Crestor 20 Mg Tabs (Rosuvastatin calcium) .Marland Kitchen... Take 1 tab by mouth at bedtime...    Niacin Cr 500 Mg Cpcr (Niacin) .Marland Kitchen... Take 2 tabs by mouth at bedtime...  Problem # 6:  DIABETES MELLITUS, TYPE II (ICD-250.00) Follow up labs showed worsening A1c up to 7.5.Marland KitchenMarland Kitchen  REC>  incr Metformin to 500mg  Bid, & add Glimepiride 1mg  Qam... Her updated medication list for this problem includes:    Aspirin 81 Mg Tbec (Aspirin) .Marland Kitchen... Take 1 tab by mouth once daily...    Cozaar 100 Mg Tabs (Losartan potassium) .Marland Kitchen... Take one tablet by mouth daily    Metformin Hcl 500 Mg Tabs (Metformin hcl) .Marland Kitchen... Take 1 tab by mouth two times a day    Glimepiride 1 Mg Tabs (Glimepiride) .Marland Kitchen... Take 1 tab by mouth once daily in the am  Problem # 7:  GI >>> Followed by DrPatterson & stable on Omeprazole & Bentyl as noted...  Problem # 8:  GU >>> Followed by DrTannenbaum w/ prev sling surg... no recent UTIs.  Problem # 9:  NECK PAIN, CHRONIC (ICD-723.1) Followed by Carles Collet w/ 2 prev surgeries... continue his meds & f/u... Her updated medication list for this problem includes:    Aspirin 81 Mg Tbec (Aspirin) .Marland Kitchen... Take 1 tab by mouth once daily...    Arthrotec 75-200 Mg-mcg Tabs (Diclofenac-misoprostol) .Marland Kitchen... Take one tablet by mouth two times a day    Methocarbamol 500 Mg Tabs (Methocarbamol) .Marland Kitchen... Take 1 tablet by mouth every 6 hours as needed for muscle spasms    Hydrocodone-acetaminophen 5-500 Mg Tabs (Hydrocodone-acetaminophen) .Marland Kitchen... Take 1 tab by mouth every 6h as needed for pain.  Problem # 10:  ANXIETY (ICD-300.00) Continue Effexor Rx... she does not want additional anxiolytic therapy... Her updated medication list for this problem includes:    Effexor 37.5 Mg Tabs (Venlafaxine hcl) .Marland Kitchen... Take 1 tab by mouth once daily...  Complete Medication List: 1)  Advair Diskus 100-50 Mcg/dose Aepb  (Fluticasone-salmeterol) .Marland Kitchen.. 1 inhalation two times a day as directed... 2)  Aspirin 81 Mg Tbec (Aspirin) .... Take 1 tab by mouth once daily.Marland KitchenMarland Kitchen 3)  Plavix 75 Mg Tabs (Clopidogrel bisulfate) .Marland Kitchen.. 1 by mouth once daily 4)  Nitrostat 0.4 Mg Subl (Nitroglycerin) .... Use as directed for cp... 5)  Metoprolol Tartrate 100 Mg Tabs (Metoprolol tartrate) .... Take 1 tab by mouth two times a day... 6)  Cozaar 100 Mg Tabs (Losartan potassium) .... Take one tablet by mouth daily 7)  Lasix 20 Mg Tabs (Furosemide) .Marland Kitchen.. 1 by mouth once daily 8)  Crestor 20 Mg Tabs (Rosuvastatin calcium) .... Take 1 tab by mouth at bedtime.Marland KitchenMarland Kitchen 9)  Niacin Cr 500 Mg Cpcr (Niacin) .... Take 2 tabs by mouth at bedtime... 10)  Fish Oil Concentrate 1000 Mg Caps (Omega-3 fatty acids) .... Take as directed by lipid clinic...4 daily 11)  Coq10 100 Mg Caps (Coenzyme q10) .... Take 1 capsule by mouth once a day 12)  Metformin Hcl 500 Mg Tabs (Metformin hcl) .... Take 1 tab by mouth two times a day 13)  Omeprazole 20 Mg Cpdr (Omeprazole) .... 2 once daily 14)  Flaxseed Oil 1000 Mg Caps (Flaxseed (linseed)) .... Takes 1300mg  1 by mouth two times a day 15)  Cod Liver Oil Oil (Cod liver oil) .... Take 2 daily 16)  Arthrotec 75-200 Mg-mcg Tabs (Diclofenac-misoprostol) .... Take one tablet by mouth two times a day 17)  Methocarbamol 500 Mg Tabs (Methocarbamol) .... Take 1 tablet by mouth every 6 hours as needed for muscle spasms 18)  Lyrica 75 Mg Caps (Pregabalin) .... Take 1 capsule by mouth two times a day 19)  Caltrate 600+d 600-400 Mg-unit Tabs (Calcium carbonate-vitamin d) .... Take 1 tab by mouth two times a day... 20)  Glucosamine 500 Mg Caps (Glucosamine sulfate) .... Take 1 capsule by mouth two times a day 21)  Multivitamins Tabs (Multiple vitamin) .Marland Kitchen.. 1 tab once daily 22)  Vitamin C 500 Mg Tabs (Ascorbic acid) .... 2 tabs qd 23)  Cvs Vitamin D 1000 Unit Caps (Cholecalciferol) .Marland Kitchen.. 1 daily. 24)  Cinnamon 500 Mg Caps (Cinnamon) ....  Take 1 capsule by mouth two times a day 25)  Garlic Powd (Garlic) .... 1000mg  2 tabs once daily 26)  Aloe Vera 470 Mg Caps (Aloe vera) .... Take 1 capsule by mouth two times a day 27)  Effexor 37.5 Mg Tabs (Venlafaxine hcl) .... Take 1 tab by mouth once daily... 28)  Meclizine Hcl 25 Mg Tabs (Meclizine hcl) .Marland Kitchen.. 1 tab every 4-6hrs prn dizziness 29)  Hydrocodone-acetaminophen 5-500 Mg Tabs (Hydrocodone-acetaminophen) .... Take 1 tab by mouth every 6h as needed for pain (not to exceed 3 per day). 30)  Dicyclomine Hcl 10 Mg Caps (Dicyclomine hcl) .... Take 1 tablet by mouth up to 3 times daily as needed for abd cramping... 31)  Glimepiride 1 Mg Tabs (Glimepiride) .... Take 1 tab by mouth once daily in the am  Patient Instructions: 1)  Today we updated your med list- see below.... 2)  We decided to change the Tramadol to VICODIN (Hydrocodone) for your pain- up to 3 daily as needed... 3)  Please return to our lab one morning this week for your FASTING blood work... please call the "phone tree" in a few days for your lab results.Marland KitchenMarland Kitchen 4)  Let's get on track w/ our low carb, low fat diet> the goal is to lose 10-15 lbs... 5)  Call for any questions.Marland KitchenMarland Kitchen 6)  Good luck w/ the knee surg... 7)  Please schedule a follow-up appointment in 6 months. Prescriptions: GLIMEPIRIDE 1 MG TABS (GLIMEPIRIDE) take 1 tab by mouth once daily in the AM  #30 x 12   Entered and Authorized by:   Michele Mcalpine MD   Signed by:   Michele Mcalpine MD on 10/16/2010   Method used:   Print then Give to Patient   RxID:   [id number] METFORMIN HCL 500 MG  TABS (METFORMIN HCL) take 1 tab by mouth two times a day  #60 x 12   Entered and Authorized by:   Michele Mcalpine MD   Signed by:   Michele Mcalpine MD on 10/16/2010   Method used:   Print then Give to Patient   RxID:   [id number] DICYCLOMINE HCL 10 MG CAPS (DICYCLOMINE HCL) Take 1 tablet by mouth up to 3 times daily as needed for abd cramping...  #90 x 12   Entered and  Authorized by:   Michele Mcalpine MD   Signed by:   Michele Mcalpine MD on 10/09/2010  Method used:   Print then Give to Patient   RxID:   [id number] HYDROCODONE-ACETAMINOPHEN 5-500 MG TABS (HYDROCODONE-ACETAMINOPHEN) take 1 tab by mouth every 6H as needed for pain (not to exceed 3 per day).  #90 x 6   Entered and Authorized by:   Michele Mcalpine MD   Signed by:   Michele Mcalpine MD on 10/09/2010   Method used:   Print then Give to Patient   RxID:   [id number] ADVAIR DISKUS 100-50 MCG/DOSE AEPB (FLUTICASONE-SALMETEROL) 1 inhalation two times a day as directed...  #1 x 12   Entered and Authorized by:   Michele Mcalpine MD   Signed by:   Michele Mcalpine MD on 10/09/2010   Method used:   Print then Give to Patient   RxID:   [id number]     Appended Document: surgical clearance OV note mailed to Dr. Eulah Pont per Dr. Jodelle Green request.

## 2010-11-02 ENCOUNTER — Encounter: Payer: Self-pay | Admitting: Pulmonary Disease

## 2010-11-02 HISTORY — PX: OTHER SURGICAL HISTORY: SHX169

## 2010-11-09 ENCOUNTER — Encounter (HOSPITAL_COMMUNITY)
Admission: RE | Admit: 2010-11-09 | Discharge: 2010-11-09 | Disposition: A | Discharge status: Home / Self Care | Payer: Medicare Other | Source: Ambulatory Visit | Attending: Orthopedic Surgery | Admitting: Orthopedic Surgery

## 2010-11-09 LAB — CBC
HCT: 43 % (ref 36.0–46.0)
Hemoglobin: 14.5 g/dL (ref 12.0–15.0)
MCH: 31.9 pg (ref 26.0–34.0)
MCV: 94.7 fL (ref 78.0–100.0)
Platelets: 267 10*3/uL (ref 150–400)
RBC: 4.54 MIL/uL (ref 3.87–5.11)
WBC: 7.9 10*3/uL (ref 4.0–10.5)

## 2010-11-09 LAB — COMPREHENSIVE METABOLIC PANEL
ALT: 31 U/L (ref 0–35)
AST: 29 U/L (ref 0–37)
Alkaline Phosphatase: 68 U/L (ref 39–117)
CO2: 25 mEq/L (ref 19–32)
Chloride: 100 mEq/L (ref 96–112)
GFR calc Af Amer: >60 mL/min (ref >60)
GFR calc non Af Amer: 57 mL/min — ABNORMAL LOW (ref >60)
Glucose, Bld: 137 mg/dL — ABNORMAL HIGH (ref 70–99)
Sodium: 135 mEq/L (ref 135–145)
Total Bilirubin: 0.4 mg/dL (ref 0.3–1.2)

## 2010-11-09 LAB — TYPE AND SCREEN: ABO/RH(D): A POS

## 2010-11-09 LAB — URINALYSIS, ROUTINE W REFLEX MICROSCOPIC
Bilirubin Urine: NEGATIVE
Glucose, UA: NEGATIVE mg/dL
Hgb urine dipstick: NEGATIVE
Specific Gravity, Urine: 1.011 (ref 1.005–1.030)
Urobilinogen, UA: 0.2 mg/dL (ref 0.0–1.0)

## 2010-11-09 LAB — APTT: aPTT: 28 seconds (ref 24–37)

## 2010-11-09 LAB — ABO/RH: ABO/RH(D): A POS

## 2010-11-09 NOTE — Letter (Signed)
Summary: Jetta Lout ANP-[facility]  Diane warden ANP-C/[facility]   Imported By: Lester [place]endale 10/31/2010 09:37:12  _____________________________________________________________________  External Attachment:    Type:   Image     Comment:   External Document

## 2010-11-14 NOTE — Letter (Signed)
Summary: [facility]  [facility]   Imported By: Sherian Rein 11/07/2010 16:07:33  _____________________________________________________________________  External Attachment:    Type:   Image     Comment:   External Document

## 2010-11-14 NOTE — Letter (Signed)
Summary: [facility]  [facility]   Imported By: Sherian Rein 11/07/2010 12:53:46  _____________________________________________________________________  External Attachment:    Type:   Image     Comment:   External Document

## 2010-11-15 ENCOUNTER — Inpatient Hospital Stay (HOSPITAL_COMMUNITY): Payer: Medicare Other

## 2010-11-15 ENCOUNTER — Inpatient Hospital Stay (HOSPITAL_COMMUNITY)
Admission: RE | Admit: 2010-11-15 | Discharge: 2010-11-20 | DRG: 470 | Disposition: A | Discharge status: Home / Self Care | Payer: Medicare Other | Source: Ambulatory Visit | Attending: Orthopedic Surgery | Admitting: Orthopedic Surgery

## 2010-11-15 DIAGNOSIS — I509 Heart failure, unspecified: Secondary | ICD-10-CM | POA: Diagnosis present

## 2010-11-15 DIAGNOSIS — E873 Alkalosis: Secondary | ICD-10-CM | POA: Diagnosis not present

## 2010-11-15 DIAGNOSIS — Z01812 Encounter for preprocedural laboratory examination: Secondary | ICD-10-CM

## 2010-11-15 DIAGNOSIS — D72829 Elevated white blood cell count, unspecified: Secondary | ICD-10-CM | POA: Diagnosis present

## 2010-11-15 DIAGNOSIS — Z01818 Encounter for other preprocedural examination: Secondary | ICD-10-CM

## 2010-11-15 DIAGNOSIS — I252 Old myocardial infarction: Secondary | ICD-10-CM

## 2010-11-15 DIAGNOSIS — I10 Essential (primary) hypertension: Secondary | ICD-10-CM | POA: Diagnosis present

## 2010-11-15 DIAGNOSIS — E871 Hypo-osmolality and hyponatremia: Secondary | ICD-10-CM | POA: Diagnosis present

## 2010-11-15 DIAGNOSIS — M1710 Unilateral primary osteoarthritis, unspecified knee: Principal | ICD-10-CM | POA: Diagnosis present

## 2010-11-15 DIAGNOSIS — N179 Acute kidney failure, unspecified: Secondary | ICD-10-CM | POA: Diagnosis present

## 2010-11-15 DIAGNOSIS — I2510 Atherosclerotic heart disease of native coronary artery without angina pectoris: Secondary | ICD-10-CM | POA: Diagnosis present

## 2010-11-15 DIAGNOSIS — D62 Acute posthemorrhagic anemia: Secondary | ICD-10-CM | POA: Diagnosis not present

## 2010-11-15 DIAGNOSIS — E119 Type 2 diabetes mellitus without complications: Secondary | ICD-10-CM | POA: Diagnosis present

## 2010-11-15 LAB — GLUCOSE, CAPILLARY
Glucose-Capillary: 146 mg/dL — ABNORMAL HIGH (ref 70–99)
Glucose-Capillary: 156 mg/dL — ABNORMAL HIGH (ref 70–99)
Glucose-Capillary: 126 mg/dL — ABNORMAL HIGH (ref 70–99)
Glucose-Capillary: 179 mg/dL — ABNORMAL HIGH (ref 70–99)

## 2010-11-16 LAB — BASIC METABOLIC PANEL
CO2: 29 mEq/L (ref 19–32)
CO2: 28 mEq/L (ref 19–32)
Calcium: 7.8 mg/dL — ABNORMAL LOW (ref 8.4–10.5)
Chloride: 96 mEq/L (ref 96–112)
Creatinine, Ser: 1.28 mg/dL — ABNORMAL HIGH (ref 0.4–1.2)
Creatinine, Ser: 1.55 mg/dL — ABNORMAL HIGH (ref 0.4–1.2)
GFR calc Af Amer: 50 mL/min — ABNORMAL LOW (ref >60)
GFR calc Af Amer: 40 mL/min — ABNORMAL LOW (ref >60)
GFR calc non Af Amer: 41 mL/min — ABNORMAL LOW (ref >60)
Glucose, Bld: 128 mg/dL — ABNORMAL HIGH (ref 70–99)
Sodium: 129 mEq/L — ABNORMAL LOW (ref 135–145)

## 2010-11-16 LAB — CBC
Hemoglobin: 10.6 g/dL — ABNORMAL LOW (ref 12.0–15.0)
MCH: 32 pg (ref 26.0–34.0)
MCHC: 33.5 g/dL (ref 30.0–36.0)
RDW: 13.8 % (ref 11.5–15.5)

## 2010-11-16 LAB — GLUCOSE, CAPILLARY: Glucose-Capillary: 189 mg/dL — ABNORMAL HIGH (ref 70–99)

## 2010-11-17 LAB — GLUCOSE, CAPILLARY
Glucose-Capillary: 145 mg/dL — ABNORMAL HIGH (ref 70–99)
Glucose-Capillary: 133 mg/dL — ABNORMAL HIGH (ref 70–99)

## 2010-11-17 LAB — BASIC METABOLIC PANEL
Chloride: 97 mEq/L (ref 96–112)
GFR calc Af Amer: >60 mL/min (ref >60)
Potassium: 4.1 mEq/L (ref 3.5–5.1)

## 2010-11-17 LAB — CBC
Platelets: 173 10*3/uL (ref 150–400)
RBC: 2.66 MIL/uL — ABNORMAL LOW (ref 3.87–5.11)
WBC: 12.1 10*3/uL — ABNORMAL HIGH (ref 4.0–10.5)

## 2010-11-18 ENCOUNTER — Inpatient Hospital Stay (HOSPITAL_COMMUNITY): Payer: Medicare Other

## 2010-11-18 LAB — BASIC METABOLIC PANEL
BUN: 8 mg/dL (ref 6–23)
Chloride: 89 mEq/L — ABNORMAL LOW (ref 96–112)
GFR calc non Af Amer: >60 mL/min (ref >60)
Glucose, Bld: 184 mg/dL — ABNORMAL HIGH (ref 70–99)
Potassium: 4 mEq/L (ref 3.5–5.1)
Sodium: 126 mEq/L — ABNORMAL LOW (ref 135–145)

## 2010-11-18 LAB — CBC
HCT: 30.5 % — ABNORMAL LOW (ref 36.0–46.0)
MCV: 91.3 fL (ref 78.0–100.0)
RBC: 3.34 MIL/uL — ABNORMAL LOW (ref 3.87–5.11)
RDW: 13.9 % (ref 11.5–15.5)
WBC: 11.7 10*3/uL — ABNORMAL HIGH (ref 4.0–10.5)

## 2010-11-18 LAB — CROSSMATCH
ABO/RH(D): A POS
Antibody Screen: NEGATIVE
Unit division: 0

## 2010-11-18 LAB — GLUCOSE, CAPILLARY
Glucose-Capillary: 106 mg/dL — ABNORMAL HIGH (ref 70–99)
Glucose-Capillary: 162 mg/dL — ABNORMAL HIGH (ref 70–99)
Glucose-Capillary: 138 mg/dL — ABNORMAL HIGH (ref 70–99)

## 2010-11-18 LAB — HEMOGLOBIN A1C: Mean Plasma Glucose: 143 mg/dL — ABNORMAL HIGH (ref <117)

## 2010-11-19 ENCOUNTER — Inpatient Hospital Stay (HOSPITAL_COMMUNITY): Payer: Medicare Other

## 2010-11-19 LAB — URINE MICROSCOPIC-ADD ON

## 2010-11-19 LAB — CBC
Hemoglobin: 9.9 g/dL — ABNORMAL LOW (ref 12.0–15.0)
MCH: 30.8 pg (ref 26.0–34.0)
MCHC: 33.9 g/dL (ref 30.0–36.0)
MCV: 91 fL (ref 78.0–100.0)
RBC: 3.21 MIL/uL — ABNORMAL LOW (ref 3.87–5.11)

## 2010-11-19 LAB — URINALYSIS, ROUTINE W REFLEX MICROSCOPIC
Bilirubin Urine: NEGATIVE
Glucose, UA: NEGATIVE mg/dL
Hgb urine dipstick: NEGATIVE
Specific Gravity, Urine: 1.014 (ref 1.005–1.030)
Urobilinogen, UA: 0.2 mg/dL (ref 0.0–1.0)
pH: 6.5 (ref 5.0–8.0)

## 2010-11-19 LAB — BASIC METABOLIC PANEL
BUN: 9 mg/dL (ref 6–23)
CO2: 34 mEq/L — ABNORMAL HIGH (ref 19–32)
Calcium: 8.3 mg/dL — ABNORMAL LOW (ref 8.4–10.5)
Chloride: 92 mEq/L — ABNORMAL LOW (ref 96–112)
Creatinine, Ser: 0.94 mg/dL (ref 0.4–1.2)
Glucose, Bld: 107 mg/dL — ABNORMAL HIGH (ref 70–99)

## 2010-11-19 LAB — CORTISOL-AM, BLOOD: Cortisol - AM: 19.9 ug/dL (ref 4.3–22.4)

## 2010-11-19 LAB — GLUCOSE, CAPILLARY
Glucose-Capillary: 154 mg/dL — ABNORMAL HIGH (ref 70–99)
Glucose-Capillary: 134 mg/dL — ABNORMAL HIGH (ref 70–99)

## 2010-11-20 LAB — BASIC METABOLIC PANEL
BUN: 8 mg/dL (ref 6–23)
Creatinine, Ser: 0.79 mg/dL (ref 0.4–1.2)
GFR calc non Af Amer: >60 mL/min (ref >60)
Glucose, Bld: 104 mg/dL — ABNORMAL HIGH (ref 70–99)
Potassium: 3.6 mEq/L (ref 3.5–5.1)

## 2010-11-20 LAB — URINE CULTURE: Colony Count: 7000

## 2010-11-20 LAB — CBC
HCT: 30 % — ABNORMAL LOW (ref 36.0–46.0)
MCH: 31.7 pg (ref 26.0–34.0)
MCV: 92.3 fL (ref 78.0–100.0)
RDW: 14 % (ref 11.5–15.5)
WBC: 8 10*3/uL (ref 4.0–10.5)

## 2010-11-23 NOTE — Op Note (Signed)
[name], [name]               ACCOUNT NO.:  [id number]  MEDICAL RECORD NO.:  [id number]           PATIENT TYPE:  I  LOCATION:  5039                         FACILITY:  [facility]  PHYSICIAN:  Loreta Ave, M.D. 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  PROCEDURES:  Left knee modified minimally invasive total knee replacement Stryker triathlon prosthesis.  Soft tissue balancing. Cemented pegged posterior stabilized #3 femoral component.  Cemented #3 tibial component and 9-mm polyethylene insert.  Resurfacing cemented 32- mm patellar component.  SURGEON:  Loreta Ave, MD  ASSISTANT:  Genene Churn. Barry Dienes, PA-C present throughout the entire case necessary for timely completion of procedure.  ANESTHESIA:  General.  BLOOD LOSS:  Minimal.  SPECIMENS:  None.  CULTURES:  None.  COMPLICATIONS:  None.  DRESSINGS:  Sterile compressive.  TOURNIQUET TIME:  1 hour and 10 minutes.  DRAIN:  Hemovac x1.  PROCEDURE:  The patient was brought to the operating room and placed on the operating table in supine position.  After adequate anesthesia had been obtained, knee examined.  Varus alignment still full extension and flexion to 90 degrees.  Tourniquet applied.  Prepped and draped in usual sterile fashion.  Exsanguinated with elevation, Esmarch.  Tourniquet inflated to 350 mmHg.  Straight incision above the patella down to the tibial tubercle.  Skin and subcutaneous tissue divided.  Hemostasis cautery.  Medial arthrotomy vastus splitting preserving quad tendon. Knee exposed.  Grade 4 changes most marked medial.  Medial capsule release.  Remnants of menisci, cruciate ligaments loose bodies removed. Intramedullary guide distal  femur.  A 10-mm resection set at 5 degrees of valgus.  Using epicondylar axis, the femur was sized, cut and fitted for a posterior stabilized pegged #3 femoral component.  Extramedullary guide on the tibia.  A 3 degrees posterior slope cut.  Size #3 component.  Patella exposed posterior 10-mm removed.  Size drilled and fitted for a 32-mm patellar component.  Trial put in place.  A #3 on the femur and tibia.  A 9-mm insert.  A 32-mm on the patella.  With this construct, excellent mechanical axis, good alignment, good stability, full motion and good patellofemoral tracking.  Tibia was marked for rotation and hand reamed.  All trials removed.  Copious irrigation with a pulse irrigating device.  Cement prepared, placed on all components which were firmly seated.  Polyethylene attached to tibia. The knee reduced.  Patella with a clamp.  Once cement hardened, we examined the knee.  Again, pleased with alignment, stability, mechanical axis and tracking.  Wound irrigated.  Hemovac placed and brought out through a separate stab wound.  Arthrotomy closed #1 Vicryl.  Skin and subcutaneous tissue with Vicryl and staples.  Sterile compressive dressing applied.  Tourniquet deflated and removed.  Anesthesia reversed.  Brought to the recovery room.  Tolerated surgery well.  No complications.     Loreta Ave, M.D.  DFM/MEDQ  D:  11/16/2010  T:  11/17/2010  Job:  [id number]  Electronically Signed by Mckinley Jewel M.D. on 11/23/2010 11:50:05 AM

## 2010-12-04 LAB — POCT I-STAT, CHEM 8
Creatinine, Ser: 0.8 mg/dL (ref 0.4–1.2)
HCT: 46 % (ref 36.0–46.0)
Hemoglobin: 15.6 g/dL — ABNORMAL HIGH (ref 12.0–15.0)
Potassium: 3.6 mEq/L (ref 3.5–5.1)
Sodium: 140 mEq/L (ref 135–145)

## 2010-12-10 NOTE — Consult Note (Signed)
NAME:  [name], [name]               ACCOUNT NO.:  [id number]  MEDICAL RECORD NO.:  [id number]           PATIENT TYPE:  I  LOCATION:  5039                         FACILITY:  [facility]  PHYSICIAN:  Calvert Cantor, M.D.     DATE OF BIRTH:  1941-01-12  DATE OF CONSULTATION: DATE OF DISCHARGE:                                CONSULTATION   PRIMARY CARE PHYSICIAN:  Lonzo Cloud. Kriste Basque, MD.  REQUESTING PHYSICIAN:  Loreta Ave, MD.  REASON FOR CONSULT:  Hyponatremia and elevated creatinine.  HISTORY OF PRESENT ILLNESS:  This is a 70 year old female with diastolic dysfunction, coronary artery disease, hypertension, diabetes, and hyperlipidemia.  The patient was admitted to [facility] on November 15, 2010, to the Orthopedic Service.  She underwent a left total knee replacement on November 15, 2010.  This morning, it was noted that she had a decreased urine output.  According to the nurse, she only had 125 mL of urine in an 8-hour period.  She was started this morning on normal saline with 20 potassium at 90 mL per hour.  Urine output has picked up in the past 2-3 hours, she has had another 125 mL.  Blood work reveals that her sodium is low and her creatinine is elevated.  The patient does admit to poor p.o. intake since she was admitted yesterday.  She does feel like her mouth is dry.  She takes Lasix on a regular basis and did receive it today.  I suspect this is for diastolic heart failure.  The patient denies any other complaints.  PAST MEDICAL HISTORY: 1. Coronary artery disease, MI in 2007.  Last cath in 2010 showed     moderate left anterior descending artery stenosis, mild left     circumflex stenosis, and a widely patent right coronary stent.  The     patient subsequently had a Myoview after this cath, which was     determined to be a low risk.  The patient has a drug-eluting stent. 2. Last 2-D echo from 2008 revealed diastolic dysfunction.  It was not     quantified, but described as  "some diastolic dysfunction" EF was     60%. 3. Hypertension. 4. PVCs. 5. Hyperlipidemia. 6. Type 2 diabetes mellitus. 7. Gastroesophageal reflux disease. 8. Diverticular disease. 9. IBS. 10.Urinary incontinence. 11.Degenerative joint disease. 12.Chronic neck pain. 13.Chronic dizziness. 14.Anxiety. 15.Acute bronchitis. 16.Cerebrovascular disease.  PAST SURGICAL HISTORY: 1. Bladder surgery. 2. Appendectomy. 3. Tonsillectomy. 4. Open cholecystectomy. 5. Cervical fusions x3. 6. Total hysterectomy. 7. Left jaw surgery for TMJ.  SOCIAL HISTORY:  She never smoked, but she was exposed to secondhand smoke from her father.  She is married.  She has 6 children.  FAMILY HISTORY:  Mother had "heart trouble."  She had 2 half brothers, one who had lung cancer, one with throat cancer.  ALLERGIES:  CODEINE, SULFA, TETRACYCLINE, AND PREDNISONE, ALL CAUSE A RASH.  CURRENT MEDICATIONS:  According to [facility] is as follows. 1. Os-Cal 500 mg b.i.d. 2. Vitamin D 3000 units daily. 3. Bentyl 10 mg p.o. b.i.d. 4. Colace 100 mg p.o. b.i.d. 5. Lovenox 30 mg subcu  q.12. 6. Premarin vaginal prep one application b.i.d. 7. Advair 1 puff b.i.d. 8. Lasix 20 mg daily. 9. Amaryl 1 mg daily. 10.NovoLog sliding scale moderate t.i.d. with meals. 11.Glucose 500 mg b.i.d. 12.Lopressor 100 mg p.o. b.i.d. 13.Niacin 1000 mg at bedtime. 14.Protonix 40 mg b.i.d. 15.Crestor 20 mg at bedtime. 16.Trimethoprim 100 mg daily. 17.Effexor 37.5 mg daily. 18.Tylenol p.r.n. 19.Dulcolax 10 mg p.o. daily p.r.n. 20.Chloraseptic 1 spray topical p.r.n. 21.Benadryl 25 mg p.o. q.4 p.r.n. 22.Norco 1-2 tablets q.4 p.r.n. 23.Maalox 30 mL q.4 p.r.n. 24.Cepacol 1 tablet p.o. p.r.n. 25.Robaxin 500 mg p.o. q.6 p.r.n., also can be given IV q.6 p.r.n. 26.Nitroglycerin 0.4 mg sublingual q.5 minutes p.r.n. 27.Zofran 4 mg IV or p.o. q.6 p.r.n. 28.Senna 1 tablet p.o. at bedtime p.r.n. 29.Fleet Enema daily p.r.n.  Of note, her MAR  in the chart also mentions that she is on Cozaar 100 mg daily.  She does take this at home as well.  PHYSICAL EXAMINATION:  GENERAL:  Elderly female lying in bed, in no acute distress. VITAL SIGNS:  Temperature 98.9; pulse 94; respirations 16; blood pressure 96/56, it was 101/50 this morning and 108/66 yesterday evening; oxygen saturation 96% on room air. HEENT:  Pupils are equal, round, and reactive to light.  Extraocular movements are intact.  Conjunctivae are pink.  No scleral icterus.  Oral mucosa is dry.  Oropharynx clear. NECK:  Supple.  No thyromegaly, lymphadenopathy, or carotid bruits. HEART:  Regular rate and rhythm.  No murmurs, rubs, or gallops. LUNGS:  Clear bilaterally.  Normal respiratory effort.  No use of accessory muscles. ABDOMEN:  Soft, nontender, nondistended.  Bowel sounds positive.  No organomegaly. EXTREMITIES:  No cyanosis, clubbing, or edema.  Pedal pulses positive bilaterally.  She has a brace on her left leg. NEUROLOGIC:  Cranial nerves II through XII intact.  Able to move all 4 extremities. PSYCHOLOGIC:  Awake, alert, oriented x3.  Mood and affect normal. SKIN:  Warm and dry.  No obvious rash or bruising.  LABORATORY DATA:  Blood work; sodium is 129 today, dropping from 134 yesterday.  Creatinine is 1.55, it was 1.28 yesterday.  In the past, it has been 0.7 to 0.8.  BUN is 13 today.  CBC revealed WBC count of 13.2, hemoglobin was 10.6, hemoglobin prior to surgery was 14.5, hematocrit 31.6, platelets 248.  IMAGING:  EKG reveals sinus rhythm with left axis deviation, unchanged from prior.  X-ray of left knee reveals status post left total knee arthroplasty without complicating feature identified.  ASSESSMENT AND PLAN: 1. Hyponatremia and acute renal failure.  I suspect this is secondary     to dehydration as well as possibly from acute tubular necrosis as     BP has been low and she is continuing to receive her blood pressure     medications.  She has  responded to IV fluids and therefore I will     continue them at the current rate until 6:00 a.m. in the morning     that would be another 12 hours.  Since she has diastolic     dysfunction, I will hold them at this point and we will repeat a     metabolic panel.  Her creatinine might be a little slow to improve.     I have placed holding parameters on her Lopressor.  I have held her     losartan for now.  In addition, I am holding her Glucophage due to     her elevated creatinine.  We  will monitor I's and O's strictly. 2. Hypotension.  See above. 3. History of diastolic dysfunction. 4. Status post left total knee replacement.  [facility] will continue to follow this patient.  Time on consult was 55 minutes.     Calvert Cantor, M.D.     SR/MEDQ  D:  11/16/2010  T:  11/16/2010  Job:  [id number]  cc:   Lonzo Cloud. Kriste Basque, MD  Electronically Signed by Calvert Cantor M.D. on 12/10/2010 03:47:45 PM

## 2010-12-12 ENCOUNTER — Telehealth: Payer: Self-pay | Admitting: Pulmonary Disease

## 2010-12-12 NOTE — Telephone Encounter (Signed)
Pt's son Gene aware pharmacy will need to send form to our office for PA. He said he will have pharmacy fax this to our office today.

## 2010-12-14 ENCOUNTER — Telehealth: Payer: Self-pay | Admitting: Pulmonary Disease

## 2010-12-14 NOTE — Telephone Encounter (Signed)
Pt's spouse says PA is needed for Arthrotec. No PA request received from the pharmacy. I spoke with the pharmacy and they will fax this request.

## 2010-12-15 MED ORDER — DICLOFENAC SODIUM 75 MG PO TBEC: DELAYED_RELEASE_TABLET | ORAL | Status: DC

## 2010-12-15 NOTE — Telephone Encounter (Signed)
Pt's spouse gene req a sub for arthrotec 75 as ins co. Is telling him today that they probably wouldn't auth this as it is expensive. Pt needs this today as her neck is in a lot of pain. [facility] [place]. Tivis Ringer

## 2010-12-15 NOTE — Telephone Encounter (Signed)
Please advise Dr. Kriste Basque an alternative for pt since pt insurance states that they most likely will not cover the arthrotec. Thanks  Allergies  Allergen Reactions  . Codeine     REACTION: nausea  . Prednisone     REACTION: rash  . Sulfonamide Derivatives     REACTION: rash  . Tetracycline     REACTION: rash    Carver Fila, CMA

## 2010-12-15 NOTE — Telephone Encounter (Signed)
Per sn okay to call in diclofenac 75 mg 1 po bid take w/ food #60 prn refills. Advised gene of rx and he states they will try that and see. Sent rx to [facility] [place]

## 2010-12-29 ENCOUNTER — Telehealth: Payer: Self-pay | Admitting: *Deleted

## 2010-12-29 NOTE — Telephone Encounter (Signed)
Pt aware and ok with recs.Carron Curie, CMA

## 2010-12-29 NOTE — Telephone Encounter (Signed)
Per SN---tell her she can use the diclofenac and take with otc prilosec--this works better than the tagmet. thanks

## 2010-12-29 NOTE — Telephone Encounter (Signed)
Pt's insurance will not cover combined drug Arthrotec but will cover the combined drugs separately of Diclofenac and Misoprostol. I see Diclofenac has already been prescribed for this patient. Pls advise if anything further is needed.

## 2011-01-09 NOTE — Discharge Summary (Signed)
[name] [name], [name] [name]               ACCOUNT NO.:  [id number]  MEDICAL RECORD NO.:  [id number]           PATIENT TYPE:  I  LOCATION:  5039                         FACILITY:  [facility]  PHYSICIAN:  Loreta Ave, M.D. DATE OF BIRTH:  Sep 28, 1940  DATE OF ADMISSION:  11/15/2010 DATE OF DISCHARGE:  11/20/2010                              DISCHARGE SUMMARY   FINAL DIAGNOSES: 1. Status post left total knee replacement for end-stage degenerative     joint disease. 2. Hyponatremia. 3. Elevated creatinine. 4. Coronary artery disease. 5. Hypertension. 6. Hyperlipidemia. 7. Type 2 diabetes. 8. Gastroesophageal reflux disease. 9. Diverticular disease.  HISTORY OF PRESENT ILLNESS:  A 70 year old white female with history of end-stage DJD, left knee, and chronic pain who presented to our office for preop evaluation for left total knee replacement.  She had progressively worsening pain with no response with conservative treatment.  Significant decrease in her daily activities due to the ongoing complaint.  HOSPITAL COURSE:  On November 15, 2010, the patient was taken to the [facility] OR and a left total knee replacement procedure was performed. Surgeon, Mckinley Jewel, MD; and assistant, Zonia Kief PA-C. Anesthesia, general.  Minimal blood loss.  Tourniquet time, 60 minutes. One Hemovac drain was placed.  There were no surgical or anesthesia complications, and the patient was transferred to recovery room in stable condition.  The patient transferred to orthopedic unit.  Pharmacy protocol Coumadin and Lovenox started for DVT prophylaxis.  On November 16, 1010, the patient doing well with good pain control.  Temperature 99.5, pulse 95, respirations 16, blood pressure 101/51.  Hemoglobin 10.6, hematocrit 31.6.  Discontinued PCA.  PT/OT consults.  The patient's BUN and creatinine were elevated, so Medicine consult was called.  On November 17, 2010, pain controlled, left knee.  No complaints of chest  pain or shortness of breath.  She was complaining of some fatigue with therapy. Temperature 98.2, pulse 102, respirations 18, blood pressure 124/61. Hemoglobin 8.5, hematocrit 24.7, sodium 129, potassium 4.1, chloride 97, CO2 of 28, BUN 9, creatinine 0.99, glucose 144.  Left knee wound looks good.  Staples intact.  No drainage or signs of infection.  Calf nontender and neurovascularly intact.  Hemovac drain removed. Symptomatic acute blood loss anemia, so transfused 2 units of packed red blood cells.  On November 18, 2010, the patient saying that she is feeling much better after transfusion.  No complaints of chest pain or shortness of breath.  Fatigue improved.  The wound looks good and staples intact. No drainage or signs of infection.  Progressing with rehab.  On November 19, 2010, the patient continues to improve.  Pain controlled. Hemoglobin 9.9.  Sodium 135, potassium 3.7, chloride 92, CO2 of 34, BUN 9, creatinine 0.94, glucose 107.  Knee wound looks good and staples intact.  No drainage or signs of infection.  On November 20, 2010, the patient is doing well.  Pain controlled.  Denies chest pain or shortness breath.  She has progressed well with therapy.  States that she wants to go home.  Temperature 98.7, pulse 104, respirations 20, blood pressure 150/74.  Hemoglobin 10.3,  hematocrit 30.0.  Sodium 133, potassium 3.6, chloride 93, CO2 of 31, BUN 8, creatinine 0.79, glucose 104.  Knee wound looks good and staples intact.  Slight medial knee redness.  No signs of infection.  Medicine team signed off.  DISCHARGE MEDICATIONS: 1. Percocet 5/325 one to two tablets p.o. q.4-6 h. p.r.n. for pain. 2. Robaxin 500 mg 1 tablet p.o. q.6 h. p.r.n. for spasms. 3. Lovenox 30 mg one subcu injection q.12 h.  Resume home meds per __________.  CONDITION:  Good and stable.  DISPOSITION:  Discharge to home.  INSTRUCTIONS:  The patient will continue to work with home health, PT, and OT to improve  ambulation and knee range of motion and strengthening. Weight bear as tolerated.  Daily dressing changes with 4x4 gauze and tape.  Lovenox x3-4 weeks postop for DVT prophylaxis.  She will follow up in the office when she is 2 weeks postop for recheck and staple removal.  Return sooner if needed.     Genene Churn. Denton Meek.   ______________________________ Loreta Ave, M.D.    JMO/MEDQ  D:  12/20/2010  T:  12/21/2010  Job:  [id number]  Electronically Signed by Zonia Kief P.A. on 01/01/2011 02:04:50 PM Electronically Signed by Mckinley Jewel M.D. on 01/09/2011 01:30:04 PM

## 2011-01-10 ENCOUNTER — Telehealth: Payer: Self-pay | Admitting: Pulmonary Disease

## 2011-01-10 MED ORDER — GABAPENTIN 300 MG PO CAPS: 300.0000 mg | ORAL_CAPSULE | Freq: Every day | ORAL | Status: DC

## 2011-01-10 MED ORDER — PRAVASTATIN SODIUM 40 MG PO TABS: 40.0000 mg | ORAL_TABLET | Freq: Every evening | ORAL | Status: DC

## 2011-01-10 NOTE — Telephone Encounter (Signed)
Spoke w/ pt and she states pt husband just recently lost his job and was advised that her medication's plavix,crestor, and lyrica will no longer be covered and she will have to pay full price for these medications. Pt states she can't afford that and needs generic's for similiar medication's or changed to something else. Please advise Dr. Kriste Basque. Thanks.   Carver Fila, CMA

## 2011-01-10 NOTE — Telephone Encounter (Signed)
Per SN---plavix is not a generic yet,  lyrica change to gabapentin 300mg    1 po qhs,  #30 and crestor change to pravastatin 40mg    1 po qhs. thanks

## 2011-01-10 NOTE — Telephone Encounter (Signed)
Allergies  Allergen Reactions  . Codeine     REACTION: nausea  . Prednisone     REACTION: rash  . Sulfonamide Derivatives     REACTION: rash  . Tetracycline     REACTION: rash

## 2011-01-10 NOTE — Telephone Encounter (Signed)
Pt aware lyruica will be changed to gabapentin 300 mg qhs and crestor is changing to pravastatin 40 mg qhs. Rx sent to [facility] on [address].

## 2011-01-16 NOTE — Assessment & Plan Note (Signed)
[facility]                            CARDIOLOGY OFFICE NOTE   NAME:[name], [name] [name]                      MRN:          [id number]  DATE:07/28/2007                            DOB:          October 13, 1940    [name] [name] was seen in followup at the [facility] cardiology office on  July 28, 2007.  [name] [name] is a delightful 70 year old woman with  coronary artery disease.  She had an inferior wall MI in July 2007  treated with primary PCI.  She had a drug-eluting stent placed in her  right coronary artery and was noted to have preserved left ventricular  function at that time with an left ventricular ejection fraction of 55%.   From a symptomatic standpoint, [name] [name] complains of occasional chest  pain.  She has taken a few nitroglycerin in the last month.  She has  been uncertain whether her symptoms represent cardiac chest pain, but  she has had episodes of chest discomfort that have occurred at rest.  They are located in the center of the chest and most recently the pain  was relieved with 2 sublingual nitroglycerin.  She has had no associated  dyspnea, diaphoresis, orthopnea, or PND.  Her symptoms have not been  elicited by exertion or exercise.   She also complains of exertional dyspnea with certain activities, such  as walking or specifically with walking when she is in a hurry.  She  denies orthopnea, PND, or edema.  She has had a few episodes of light-  headedness, but no syncope.   CURRENT MEDICATIONS:  1. Aspirin 81 mg daily.  2. Omeprazole 20 mg twice daily.  3. Dicyclomine 10 mg twice daily.  4. Garlic.  5. Fish oil.  6. Metformin 250 mg twice daily.  7. Lipitor 80 mg daily.  8. Plavix 75 mg daily.  9. Metoprolol 50 mg twice daily.  10.Cyclobenzaprine 10 mg twice daily.  11.Lyrica 75 mg twice daily.  12.Multiple vitamins.  13.Niacin 500 mg twice daily.   ALLERGIES:  SULFA, TETRACYCLINE, PREDNISONE.   EXAMINATION:  She is alert  and oriented.  She is in no acute distress.  Weight is 151.  Blood pressure 120/72, heart rate is 71, respiratory  rate is 16.  HEENT:  Normal.  NECK:  Normal carotid upstrokes without bruits.  Jugular venous pressure  is normal.  LUNGS:  Clear to auscultation bilaterally.  CARDIOVASCULAR:  The apex is discrete and nondisplaced.  The heart is  regular rate and rhythm without murmurs or gallops.  ABDOMEN:  Soft and nontender.  No organomegaly.  No abdominal bruits.  EXTREMITIES:  There is trace pretibial edema bilaterally.  There is no  cyanosis or clubbing.  Pedal pulses are 2+ and equal throughout.  SKIN:  Warm and dry without rash.   ELECTROCARDIOGRAM:  Shows normal sinus rhythm with an age-indeterminate  inferior MI pattern.  There is also a nonspecific T wave abnormality  with diffuse T wave flattening.  There are no significant ST segment  changes.   ASSESSMENT:  1. Coronary artery disease status post inferior myocardial infarction.  There nonobstructive disease noted with a 50% proximal left      anterior descending stenosis.  I am concerned that [name] [name] is      having chest pain and has taken nitroglycerin.  I would like to      risk stratify her with an adenosine Myoview stress study.  Will      continue with aggressive secondary risk reduction as outlined      below.  Regarding her duration of antiplatelet therapy, I elected      to continue her on dual antiplatelet therapy with aspirin and      Plavix for 2 years.  This will take her to July 2009 for      discontinuation of Plavix.  This is in the setting of a drug-      eluting stent for treatment of acute myocardial infarction.  2. Dyslipidemia.  She remains on therapy with fish oil, niacin, and      high dose Lipitor.  Lipids from August of this year showed a      cholesterol of 127 with an HDL of 40 and an LDL of 60.  Continue      current therapy with repeat lipids and LFTs next summer.  3. Exertional dyspnea.   Will evaluate with a 2D echo.  I do not detect      any evidence of structural heart disease or valvular abnormalities.      Would like to evaluate her diastolic parameters to make sure these      are not manifestations of diastolic heart failure.   For followup, I would like to see [name] [name] back in 6 months.  We will  be in touch with her by telephone after the results of her nuclear study  and echocardiogram are complete.     [name]. Excell Seltzer, MD  Electronically Signed    MDC/MedQ  DD: 07/28/2007  DT: 07/28/2007  Job #: [id number]   cc:   [name] [name]. [name] Basque, MD

## 2011-01-16 NOTE — Assessment & Plan Note (Signed)
[facility]                               LIPID CLINIC NOTE   NAME:[name] [name], [name] [name]                      MRN:          [id number]  DATE:01/21/2008                            DOB:          1941/03/14    REFERRING PHYSICIAN:  [name]. Excell Seltzer, MD   PRIMARY CARE PHYSICIAN:  Dr. [name].   The patient seen in the Lipid Clinic for further evaluation and  medication titration.  States she has a history of STATIN intolerance.  The patient states she took Lipitor 80 mg for a period of time and has  been off for the past 2 weeks due to weakness, which resolved.  Lipitor  40 plus coenzyme Q-10 was started on Jan 15, 2008 and weakness is  returning.   PAST MEDICAL HISTORY:  Pertinent for CAD, hypertension, chronic neck  pain, and diabetes.   CURRENT MEDICATIONS:  1. Include Lyrica 75 mg 3 times daily.  2. Metformin 250 mg twice daily.  3. Omeprazole 20 mg b.i.d.  4. Dicyclomine 10 mg 4 times daily.  5. Metoprolol 75 mg twice daily.  6. Lipitor 40 mg daily.  7. Diclofenac 75 mg daily.  8. Furosemide 20 mg daily.  9. Cyclobenzaprine 10 mg 2 times daily.  10.Propoxyphene N 100 daily as needed.  11.Niacin 500 mg twice daily.  12.Vitamin C 1000 mg twice daily.  13.Coenzyme Q-10 100 mg daily.  14.Super-strength B complex vitamin daily.  15.Multivitamin daily.  16.Aloe vera 25 mg twice daily.  17.Cod liver oil twice daily.  18.Garlic 500 mg twice daily.  19.Fish oil 1000 mg twice daily.  20.Calcium 600 mg twice daily.  21.Cinnamon 500 mg 4 times daily.  22.Bilberry extract 1000 mg twice daily.  23.Flax seed oil 1300 mg daily.   REVIEW OF SYSTEMS:  As stated in the HPI and otherwise negative.   SOCIAL HISTORY:  The patient is a never-smoker.  She does not drink  alcohol regularly.   DIET REVIEW:  She is trying to increase her vegetables and cut back on  salt.  She eats mostly chicken, though roast beef and red meats are  eaten on a rare  basis.   For exercise, she has tried to walk on a treadmill, but has had a hard  time recently, now that she has muscle weakness.   FAMILY HISTORY:  Pertinent for a mom who passed away of carotid artery  stenosis, 3-vessel with bypass, and pancreatic cancer.  Father is  unknown.   PHYSICAL EXAM:  Weight is 156 pounds, blood pressure 120/64, heart rate  is 60.   LABORATORY STUDIES:  August 2008, revealed total cholesterol 127,  triglyceride 201, HDL 34, LDL 60.5.   ASSESSMENT:  The patient started coenzyme Q-10 4 days ago.  It will take  at least 2 weeks for the patient to benefit with the muscle aches.  Lipid panel is planned for 2 weeks from now.  We will follow up in 3  weeks with the patient.  She will continue on her niacin 500 mg twice a  day, fish oil 1000  mg twice daily, Lipitor 40 mg, and her coenzyme Q-10  daily.  She will keep going with her diet, exercise, and work to reduce  her dietary intake and use of butter in cooking.  She will do what she  can with the muscle pain.  She understands that if the muscle pain gets  worse or changes in any way, she will call and come in for lab work  early.  I appreciate the opportunity to see this pleasant patient.      Shelby Dubin, PharmD, BCPS, CPP  Electronically Signed      [name]. Excell Seltzer, MD  Electronically Signed   MP/MedQ  DD: 01/21/2008  DT: 01/21/2008  Job #: [id number]   cc:   Lonzo Cloud. Kriste Basque, MD  [name]. Excell Seltzer, MD

## 2011-01-16 NOTE — Assessment & Plan Note (Signed)
[facility]                               LIPID CLINIC NOTE   NAME:[name] [name]                      MRN:          [id number]  DATE:03/25/2008                            DOB:          1941/02/06    Return office visit for Lipid Clinic.   PAST MEDICAL HISTORY:  Hyperlipidemia; coronary artery disease status  post inferior wall MI in 2007 with a PCI to her RCA, residual disease in  her LAD and circumflex; diabetes mellitus; gastroesophageal reflux  disease; diverticular disease; hypertension; and degenerative joint  disease.   MEDICATIONS:  1. Aspirin 81 mg daily.  2. Omeprazole 20 mg twice daily.  3. Multivitamin daily.  4. Cinnamon tablet daily.  5. Glucosamine chondroitin tablet daily.  6. Vitamin C daily.  7. Dicyclomine 20 mg twice daily.  8. Garlic tablet twice daily.  9. Omega-3 fatty acid twice daily.  10.Aloe vera tablet twice daily.  11.Metoprolol 100 mg twice daily.  12.Lyrica 75 mg 3 times daily.  13.Furosemide 20 mg daily.  14.Bilberry extract twice daily.  15.Flaxseed tablet daily.  16.Coenzyme Q10 100 mg daily.  17.Crestor 20 mg daily.  18.Metformin 250 mg daily.  19.Plavix 75 mg daily.  20.Cyclobenzaprine at bedtime.  21.Calcium with vitamin D twice daily.  22.Super Stress with vitamin B complex daily.  23.Cod liver oil daily.  24.Niacin 500 mg twice daily.   Vital Signs:  Weight 152 pounds, blood pressure 130/60, and heart rate  70.   LABORATORY DATA:  Total cholesterol 129, triglycerides 265, HDL 36, and  LDL 65.  LFTs within normal limits.   ASSESSMENT:  [name] [name] is a very pleasant 69 year old woman who is  accompanied by her husband.  They seemed to be trying to have healthy  lifestyle.  They have tried to increase the amount of fiber and fruits  and vegetables in their diet.  They have also changed to whole wheat  bread and whole wheat pasta.  For breakfast, she drinks RightSize shake,  which is some healthy  weight loss shake that she adds a cup of fruit  juice and fruits to it to make this into some type of smoothie .  She  eats a trail mix bar mid through the morning.  She has coffee with  flavored creamer in the morning for breakfast.  Her main meal is between  2 and 4 o'clock in the afternoon.  She steams vegetables with a small  amount of butter or does a stir fry with a small amount of olive oil and  some whole wheat pasta or rice and occasionally has some type of  chicken, fish, or red meat with it.  She drinks mostly water throughout  the day, occasionally has lemonade.  In the evening she does not eat a  very large meal, but has potato soup or an apple, whole wheat crackers,  and some peanut butter, etc.  Although she is trying to do a lot of the  right things by keeping a lot of trans fat out of her diet, she does  seem to be  increasing the amount of sugars, juice, and carbohydrates in  her diet to substitute for the fat.  We spent a long time picking the  correct foods to eat, talking about portion sizes, and alternatives to  the things that she is currently doing.  Some pamphlet information was  given about foods to use and foods that should be used in small  quantities.  She does seem to be tolerating the Crestor 20 mg now  without any problems, where she was having some muscle aches and pains  with the Lipitor.  She is not having those anymore.  She does not have  any muscle aches or pains, chest pain, or shortness of breath.  She is  compliant with other medications.  She does exercise about 5 minutes 2  or 3 times a day doing that 2 or 3 times a week.  She states that she  only can do it in small increments because she does get very short of  breath and her legs get tired.  I have encouraged her on a daily basis  to walk 5 minutes 3 times a day for 2 weeks and then increase one of  her's to 7 minutes once a day with the other two being 5 minutes and to  slowly increase her  walking time until we can get to a total of 30  minutes at a time.  She does seem very willing to make these lifestyle  changes, decrease the amount of sugars and fats in her diet, and also  increasing her exercise.  I feel that her triglycerides are elevated in  lieu of some of her diet changes.  I am not going to make any medication  changes at this time.  She has only been on Crestor for about 2 weeks,  so I will give that time to make any type of impact that it may.  Her  total cholesterol is at goal of less than 200, triglycerides greater  than goal of less than 150, HDL less than goal of greater than 40, and  LDL goal of less than 70.   PLAN:  1. Continue Crestor 20 mg daily.  2. Decrease fats and sugars in diet.  3. Increase exercise.  4. Followup visit in 3 months for lipid panel and LFTs.  We will make      adjustments at that time.      [name] [name], PharmD  Electronically Signed      Jesse Sans. Daleen Squibb, MD, [facility]  Electronically Signed   LC/MedQ  DD: 03/25/2008  DT: 03/26/2008  Job #: [id number]

## 2011-01-16 NOTE — Assessment & Plan Note (Signed)
[facility]                               LIPID CLINIC NOTE   NAME:[name], [name]                      MRN:          [id number]  DATE:02/09/2008                            DOB:          08-20-1941    [name] [name] is seen in Lipid Clinic for further evaluation and medication  titration associated with her hyperlipidemia.  She finds no changes in  her diet since her last visit.  She has continued to have weakness and  has not had an easy road with her Lipitor 80 mg daily, which is her  current therapy.  She has been walking as tolerated on her treadmill.  Her past medical history is important for documented coronary artery  disease status post inferior wall MI in July 2007, with drug-eluting  stent to RCA, preserved LV dysfunction at that time.   CURRENT MEDICATIONS:  For [name] [name] include:  1. Aspirin 81 mg daily.  2. Omeprazole 20 mg daily.  3. Multivitamin, vitamin C, and cinnamon daily.  4. Glucosamine, MSM, and chondroitin daily.  5. Dicyclomine 20 mg twice daily.  6. Garlic 500 mg twice daily.  7. Omega-3 fish oils 1000 mg twice daily.  8. Vitamin C 1000 mg twice daily.  9. Aloe Vera 25 mg twice daily.  10.Metoprolol tartrate 75 mg twice daily.  11.Lyrica 75 mg 3 times daily.  12.Furosemide 20 mg daily.  13.Bilberry extract  1000 mg twice daily.  14.Flaxseed oil 1300 mg daily.  15.Lipitor 80 mg daily.  16.Metformin 250 mg twice daily.  17.Plavix 75 mg daily.  18.Cyclobenzaprine 10 mg one to twice daily.  19.Calcium __________  with D 600 mg twice daily.  20.Super Stress B-Complex daily.  21.Cod liver oil, fish oil twice daily.  22.Niacin 500 mg twice daily which has been discontinued.   REVIEW OF SYSTEMS:  As stated in HPI.  Otherwise, negative.   PHYSICAL EXAMINATION:  Weight today is 156 pounds, blood pressure is  123/82, and heart rate is 60.   LABS:  On February 05, 2008, reveals total cholesterol 141, triglyceride 280,  HDL 59, LDL  28, and non-HDL cholesterol was 82.  LFTs were within normal  limits.   ASSESSMENT:  The patient is a 70 year old female that is currently at  goal on Lipitor 80, but she is having persistent weakness, thought to be  possibly secondary to statin use.  The patient's last visit was asked to  hold her statin for 2 weeks, follow up with the clinic after restarting.  On physical exam, the patient reports today that she has had no change  in her weakness.  We will go ahead and re-wash the patient out.  We will  give a 2-week washout and we have given samples to the patient of  Crestor 20 mg to take daily for 4 weeks, the samples are lot number  [id number] and expiration is July 2012.  The patient will return for lipid  panel on March 23, 2008.  She will follow up in clinic on March 25, 2008,  at 10 o'clock and reassess weakness  at that time.  She will call if  muscle aches, pains, weakness, fatigue, or other issues in the meantime.      Shelby Dubin, PharmD, BCPS, CPP  Electronically Signed      Rollene Rotunda, MD, [facility]  Electronically Signed   MP/MedQ  DD: 02/10/2008  DT: 02/10/2008  Job #: [id number]   cc:   Lonzo Cloud. Kriste Basque, MD  [name]. Excell Seltzer, MD

## 2011-01-16 NOTE — Assessment & Plan Note (Signed)
[facility]                            CARDIOLOGY OFFICE NOTE   NAME:Wands, JOLEIGH MINEAU                      MRN:          [id number]  DATE:01/02/2007                            DOB:          07/23/1941    [name] [name] was seen as an outpatient at the [facility] Cardiology office  on Jan 02, 2007. She is a 70 year old woman who had an inferior MI in  July 2007. She was treated with primary PCI and had a Taxus stent placed  in her right coronary artery. Her overall LV function was preserved with  an estimated left ventricular ejection fraction of 55%. At present, [name] [name] is doing well. She has no exertional chest pain or dyspnea. She  denies light-headedness, syncope, orthopnea, PND, palpitations, or other  specific complaints. She is trying to adhere to an exercise program and  is working on increasing her activity level as the weather has become  warmer.   CURRENT MEDICATIONS:  1. Metformin 250 mg twice daily.  2. Lipitor 80 mg daily.  3. Plavix 75 mg daily.  4. Metoprolol 50 mg twice daily.  5. Hyoscyamine 0.375 mg daily.  6. Cyclobenzaprine 10 mg twice daily.  7. Lyrica 75 mg twice daily.  8. Niacin 500 mg twice daily.  9. Aspirin 81 mg daily.  10.Omeprazole 20 mg twice daily.  11.Arthrotec 75 mg daily.  12.Multiple supplements.   PHYSICAL EXAMINATION:  GENERAL:  She is alert and oriented in no acute  distress.  VITAL SIGNS:  Weight is 150 pounds, blood pressure is 132/74, heart rate  was 90.  HEENT:  Normal.  NECK:  Normal carotid upstrokes without bruit. Jugular venous pressure  is normal.  LUNGS:  Clear to auscultation bilaterally.  HEART:  Regular rate and rhythm without murmurs or gallops. The apex is  discreet and nondisplaced.  ABDOMEN:  Soft and nontender, no organomegaly.  EXTREMITIES:  No clubbing, cyanosis or edema. Peripheral pulses are 2+  and equal throughout.   Labs from March 12 of this year show a normal CBC with a  hemoglobin of  14.3 and platelet count of 326,000. Potassium was normal at 4.4. BUN and  creatinine were 17 and 0.7. The last lipids were drawn October 2007 and  total cholesterol was 116 with an HDL of 31 and an LDL cholesterol of  53. Triglycerides were 164.   EKG shows normal sinus rhythm with left axis deviation and cannot rule  out inferior MI. There is a nonspecific T wave abnormality present.   ASSESSMENT:  [name] [name] is currently stable from a cardiovascular  standpoint. Her cardiac problems are as follows:   1. Coronary artery disease with prior inferior wall myocardial      infarction. She should remain on aspirin and clopidogrel. Could      consider discontinuation of clopidogrel this summer as she will be      1 year out from her MI in July. Will review this with her at her      next office visit and consider discontinuation at that point. She  is to remain on aspirin indefinitely. She is doing reasonably well      with her medical regimen and I would not recommend any changes at      this point.  2. Dyslipidemia. Predominantly has a low HDL. Her LDL is at goal. She      should continue Niaspan and fish oil. She will be due for lipids      again in October around the time of her next office visit.  3. Hypertension. Blood pressure well controlled on current therapy.   FOLLOWUP:  I would like to see [name] [name] back in 6 months or sooner if  any new problems arise.     [name]. Excell Seltzer, MD  Electronically Signed    MDC/MedQ  DD: 01/06/2007  DT: 01/06/2007  Job #: [id number]   cc:   [name] Cloud. Kriste Basque, MD

## 2011-01-16 NOTE — Assessment & Plan Note (Signed)
[facility]                               LIPID CLINIC NOTE   NAME:Lawhorn, FRANCILLE WITTMANN                      MRN:          [id number]  DATE:09/09/2008                            DOB:          1940-12-05    Ms. Fordham is seen in the Lipid Clinic for further evaluation and  medication titration associated with her hyperlipidemia.  On dietary  review, the patient states she eats lots of vegetables and chicken.  She  is discontinued use of juice and butter.  She does cook with olive oil  on a regular basis.  Did not consume fried foods and uses sea salt or a  salt substitute on a regular basis.  As an exercise review, the patient  states she has a treadmill and bicycle at her home, but it is in the  son's room which is cold currently.  We asked questions today regarding  Silver Sneakers.  Her daughter has recently joined [employer], she relates and  Silver Sneakers is available there.  She is compliant and tolerating her  Crestor 20 mg daily, niacin 1000 mg daily, and fish oil 2 g twice daily  without issue or problem at this time.   PHYSICAL EXAMINATION:  Weight today is 150 pounds, blood pressure is  136/80, heart rate is 72, and respirations are 16.   LABORATORY DATA:  On May 24, 2008, revealed total cholesterol of  152, triglycerides 225, HDL 29.6, and LDL 70.   ASSESSMENT:  The patient continues to improve after a recent change from  Lipitor to Crestor.  She has a very varied diet, mother to resolve her  food labels.  Her triglycerides are still elevated, but have been  trending in the current direction.  She is currently taking metformin at  home, but not checking her blood sugars and past labs have shown  elevated glucose in the past.  The patient has been encouraged to  discuss this with Dr. Kriste Basque at her next visit because if the sugars  trend in a downward fashion, we may be able to further improve her  triglycerides.  The patient will continue on  her therapies at this time.  She will continue to read food labels and continue to think about the  amount of sugar in food and increase in exercise has been encouraged.  The patient will follow up with this on February 11, 2008  at 10 o'clock.   I appreciate the opportunity to see this pleasant patient.     Shelby Dubin, PharmD, BCPS, CPP  Electronically Signed      Rollene Rotunda, MD, [facility]  Electronically Signed   MP/MedQ  DD: 09/27/2008  DT: 09/28/2008  Job #: [id number]   cc:   [name]. Excell Seltzer, MD

## 2011-01-16 NOTE — Assessment & Plan Note (Signed)
[facility]                            CARDIOLOGY OFFICE NOTE   NAME:Gibbon, COLLETTE PESCADOR                      MRN:          [id number]  DATE:07/06/2008                            DOB:          [birth year]    Tyla Burgner was seen in followup at the [facility] Cardiology Office on  July 06, 2008.  She is a 70 year old woman with coronary artery  disease who had an inferior wall MI in 2007 and was treated with the  drug-eluting stent to the right coronary artery.  Her LV function has  been preserved and her last echo from August 07, 2007, demonstrated an  LVEF of 60% with mild mitral regurgitation and diastolic abnormalities.  She underwent a stress Myoview scan because of residual disease in the  LAD.  This was also done on August 07, 2007, and it showed again an  LVEF of 70% with a small area of moderate ischemia in the inferolateral  wall near the apex.   From a symptomatic standpoint, she is doing well.  She has had rare  episodes of chest pain, but has not required any nitroglycerin.  She has  had no exertional symptoms.  She has previously complained of leg  fatigue with walking, but this is improved since changing from Lipitor  to Crestor.  She denies dyspnea, orthopnea, PND, or edema.   CURRENT MEDICATIONS:  1. Metformin 500 mg one-half b.i.d.  2. Plavix 75 mg daily.  3. Cyclobenzaprine 10 mg twice daily.  4. Calcium boost plus D 600 mg twice daily.  5. Cod liver oil.  6. Niacin 500 mg b.i.d.  7. Dicyclomine 10 mg b.i.d.  8. Metoprolol 100 mg b.i.d.  9. Omega-3 fish oil 2 b.i.d.  10.Flaxseed oil b.i.d.  11.Aspirin 81 mg daily.  12.Omeprazole 20 mg b.i.d.  13.Glucosamine.  14.Multivitamin.  15.Garlic.  16.Vitamin C.  17.Aloe vera.  18.Lyrica 75 mg t.i.d.  19.Furosemide 20 mg daily.  20.Coenzyme Q10.  21.Crestor 20 mg daily.   ALLERGIES:  SULFA, TETRACYCLINE, and PREDNISONE.   PHYSICAL EXAMINATION:  GENERAL:  The patient is alert  and oriented.  She  is in no acute distress.  VITAL SIGNS:  Weight is 150 pounds, blood pressure 110/76, heart rate  73, and respiratory rate 12.  HEENT:  Normal.  NECK:  Normal carotid upstrokes.  No bruits.  JVP normal.  LUNGS:  Clear bilaterally.  HEART:  Regular rate and rhythm.  No murmurs or gallops.  ABDOMEN:  Soft, nontender.  No organomegaly.  EXTREMITIES:  No clubbing, cyanosis, or edema.  Peripheral pulses are  intact and equal.  SKIN:  Warm and dry without rash.   EKG shows sinus rhythm with PVCs.  I cannot rule out age-indeterminate  anterior infarct.   ASSESSMENT:  1. Coronary artery disease.  She has no anginal symptoms.  She had a      low-risk Myoview scan last year.  Continue her current medical      program without changes.  I would have a low threshold for reload      catheterization in the setting of  her moderate left anterior      descending stenosis at the time of previous catheterization.      However, at this point, she has no suggestive symptoms and      therefore, we will defer at present.  2. Dyslipidemia.  She is on multidrug therapy.  She is followed in a      Lipid Clinic and there has been an extensive amount of dietary      counseling.  Her medical compliance is excellent.  Last LDL was 70      with an HDL of 30, triglycerides 225, and total cholesterol 152.  3. Symptomatic premature ventricular contractions.  Palpitations are      improved after an increase in her beta-blocker dose.  Continue      metoprolol 100 mg b.i.d.   For followup, I would like to see Ms. Ofallon back in 6 months.     [name]. Excell Seltzer, MD  Electronically Signed    MDC/MedQ  DD: 07/06/2008  DT: 07/07/2008  Job #: [id number]   cc:   Lonzo Cloud. Kriste Basque, MD

## 2011-01-16 NOTE — Assessment & Plan Note (Signed)
[facility]                            CARDIOLOGY OFFICE NOTE   NAME:[name] [name], [name] [name]                      MRN:          [id number]  DATE:12/30/2007                            DOB:          12/04/1940    HISTORY:  [name] [name] returns for followup at the [facility]  office on December 30, 2007.  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  She was treated with a drug-eluting stent to the right coronary artery  and had preserved LV systolic function at that time.  She had moderate  residual disease in the LAD.  She has had follow up studies with a  nuclear scan in December 2008 that demonstrated a small area of ischemia  in the inferolateral wall near the apex.  Overall the study was low risk  and she has continued with medical therapy.  She has also had a follow  up echocardiogram that showed normal LV systolic function with an EF of  60% with mild mitral regurgitation.  There is a description of diastolic  dysfunction, but her transmitral velocities were normal, so I am not  sure how that diagnosis was made.   From a symptomatic standpoint, [name] [name] is experiencing palpitations,  exertional dyspnea and generalized fatigue.  Her legs feel tired with  walking.  Palpitations occur with rest and exertion.  She has had no  chest pain.  She has had no symptoms that are reminiscent of her  myocardial infarction.  She underwent event recording because of her  palpitations.  I have these files for review and all transmissions have  shown sinus rhythm with PVCs.  There are no complex arrhythmias noted.   MEDICATIONS:  1. Aspirin 81 mg daily.  2. Omeprazole 20 mg twice daily.  3. Multivitamin daily.  4. Glucosamine chondroitin.  5. Dicyclomine.  6. Garlic.  7. Omega III fish oil.  8. Vitamin C.  9. Metoprolol tartrate 75 mg twice daily.  10.Lyrica 75 mg 3 times daily.  11.Lasix 20 mg daily.  12.Metformin 250 mg twice daily.  13.Lipitor 80 mg daily.  14.Plavix 75 mg daily.  15.Cyclobenzaprine.  16.Niacin 500 mg twice daily.  17.Cod liver oil.   ALLERGIES:  1. SULFA.  2. TETRACYCLINE.   PHYSICAL EXAMINATION:  GENERAL:  She is alert and oriented in no acute  distress.  VITAL SIGNS:  Blood pressure 120/78, heart rate 85, respiratory rate 16.  HEENT:  Normal.  NECK:  Normal carotid upstrokes without bruits.  Jugulovenous pressure  is normal.  LUNGS:  Clear bilaterally.  HEART:  Regular rate and rhythm without murmurs or gallops.  ABDOMEN:  Soft, obese, nontender, no organomegaly.  EXTREMITIES:  No clubbing, cyanosis or edema.  Peripheral pulses 2+ and  equal throughout.  SKIN:  Warm and dry without rash.   DIAGNOSTICS:  EKG shows sinus rhythm with frequent PVCs.  Otherwise  within normal limits.   ASSESSMENT:  1. Coronary artery disease status post inferior myocardial infarction.      Continue aspirin and Plavix  as she is tolerating well.  Secondary      risk reduction as below.  No signs of angina at present.  Myoview      stress study results as noted above.  2. Dyslipidemia.  Lipids from last August showed cholesterol of 127      with an HDL of 40 and an LDL of 60.  The patient is concerned that      many of her symptoms are coming from Lipitor.  It is difficult to      know because she is on multiple medications, but I think it is okay      to give her a 2 week holiday from Lipitor.  After 2 weeks, I would      like her to start back up on 40 mg daily which will be half of her      current dose.  She continues with her other antihyperlipidemic      medications without changes.  3. Symptomatic premature ventricular contractions.  Increased on beta-      blocker dose to metoprolol 100 mg twice daily.   FOLLOW UP:  I would like to see [name] [name] back in 6 months.     [name]. Excell Seltzer, MD  Electronically Signed    MDC/MedQ  DD:  12/30/2007  DT: 12/30/2007  Job #: [id number]   cc:   Lonzo Cloud. Kriste Basque, MD

## 2011-01-19 NOTE — Op Note (Signed)
NAME:  Eichorn, Montina               ACCOUNT NO.:  [id number]   MEDICAL RECORD NO.:  [id number]          PATIENT TYPE:  INP   LOCATION:  3028                         FACILITY:  [facility]   PHYSICIAN:  Clydene Fake, M.D.  DATE OF BIRTH:  [birth year]   DATE OF PROCEDURE:  08/02/2005  DATE OF DISCHARGE:                                 OPERATIVE REPORT   PREOPERATIVE DIAGNOSIS:  Nonunion C6-C7 spondylosis, foraminal stenosis C7-  T1, prior anterior cervical plate instrumentation with loose screw which is  displaced.   POSTOPERATIVE DIAGNOSIS:  Nonunion C6-C7 spondylosis, foraminal stenosis C7-  T1, prior anterior cervical plate instrumentation with loose screw which is  displaced.   PROCEDURE:  Removal of anterior cervical plate with screw which was pulling  out, anterior cervical decompression, discectomy and fusion, C6-C7 and C7-  T1, with Lifenet allograft bone and Eagle anterior cervical plate.   SURGEON:  Clydene Fake, M.D.   ASSISTANT:  Cristi Loron, M.D.   ANESTHESIA:  General endotracheal anesthesia.   ESTIMATED BLOOD LOSS:  Minimal.   BLOOD REPLACED:  None.   DRAINS:  None.   COMPLICATIONS:  None.   INDICATIONS FOR PROCEDURE:  The patient is a 70 year old woman who has had a  prior anterior cervical fusion C4 through C7, who has had a nonunion of C6-  C7 and the screw at C6-C7.  Both screws had loosened and the left sided  screw started pulling out away from the plates.  The patient also has been  having neck pain that has been worsening with pain in the shoulders and  sometimes left arm, and spondylitic changes worse over C7-T1 level.  The  patient is brought in for removal of the displaced plate and screw and redo  fusion at C6-C7 and ACF at C7-T1.   PROCEDURE IN DETAIL:  The patient was brought to the operating room, general  anesthesia was induced, the patient was placed in 5 pounds of halter  traction, was prepped and draped in the usual sterile fashion.   The site of  incision was injected with 10 mL of 1% lidocaine with epinephrine.  The  incision of the previous surgery was then made from the midline to the  anterior border of the sternocleidomastoid muscle on the left side of the  neck.  The incision was taken down to through the platysma and hemostasis  obtained with Bovie cautery.  The platysma was incised with the Bovie and  blunt dissection was taken through the anterior cervical fascia and scar and  was taken down to the anterior cervical spine.  We exposed the anterior  cervical plate and removed the screws at C4, C5, and C6.  We then went into  the caudal side of the omohyoid muscle and dissected down through the  cervical fascia, finding the plate, and continued exposing the plate in its  entirety encountering the screw that was pulled out on the left side.  Both  screws of C7 were removed and the plate was removed.  We incised the C6, C7,  and T1 disc and nonunion at C6-C7.  We placed markers in both of these areas  and took a lateral x-ray confirming our positioning at C6-C7 to C7-T1.  Self-  retaining retractors were placed and distraction pins were placed into the  C6 and T1 interspaces.  The microscope was brought in for the  microdissection.  At this point, we used the high speed drill to remove the  anterior osteophytes, cartilaginous endplates, and then 1 and 2 mm Kerrison  punches were used to remove posterior osteophytes, disc, and ligament, and  foraminotomies were performed with Kerrison punches.  When we were finished,  we had good central and lateral decompression, the C8 roots were  decompressed bilaterally.  We measured the height of the disc space to be 7  mm.  We got good hemostasis with Gelfoam and thrombin.  We then explored the  6-7 nonunion space, there was soft tissue at the junction, we removed with  pituitaries and the high speed drill, and removed anterior osteophytes and  drilled out the interspace at  6-7.  We made sure the foramen were open with  Kerrison punches.  We checked the foramen with nerve hooks and the nerve  hooks and the roots were decompressed.  We measured the height of the disc  space 3-4 mm.  Hemostasis was with Gelfoam and thrombin, the Gelfoam and  thrombin were irrigated out.  At both disc spaces, 7 mm Lifenet allograft  bone was placed into the 7-1 space, counter sunk a couple of mm, there was  plenty of room between bone graft and dura, and a 7 mm graft was tapped into  place at the 6-7 space.  Distraction and distraction pins were removed.  Weight was removed from the traction.  Osteophytes were removed from T1 with  Leksell rongeurs and a high speed drill.  An Eagle anterior cervical plate  was placed over the anterior cervical spine, two screws were placed into C6,  two at C7, and two at T1.  These were tightened down.  A lateral x-ray was  obtained showing good position of plate, screws, and bone grafts at 6-7 and  7-1.  The retractor was removed.  Hemostasis was obtained with Gelfoam and  thrombin and bipolar cauterization.  Gelfoam was then irrigated out.  We  irrigated with antibiotic solution, had good hemostasis, and the platysma  was closed with 3-0 Vicryl interrupted sutures, the subcutaneous tissue were  closed with the same, the skin was closed with Benzoin and Steri-Strips.  A  dressing was placed.  The patient was placed in a hard cervical collar,  awakened from anesthesia, and transferred to the recovery room in stable  condition.           ______________________________  Clydene Fake, M.D.     JRH/MEDQ  D:  08/02/2005  T:  08/02/2005  Job:  [id number]

## 2011-01-19 NOTE — Assessment & Plan Note (Signed)
[facility]                               LIPID CLINIC NOTE   NAME:[name] [name], [name] [name]                      MRN:          [id number]  DATE:05/28/2008                            DOB:          15-Jan-1941    Ms. Baeten is seen back in the Lipid Clinic for further evaluation and  medication titration associated with her hyperlipidemia in the setting  of documented coronary artery disease.  The patient has been compliant  with her Crestor 20 mg and niacin 1000 mg daily at bedtime and fish oil  2 g twice daily.  She is compliant with her meds and is tolerating them  well.  She has been having breakfast that include rice, salad, shakes  with soy milk instead of fruit juice.  She was encouraged to increase  her almonds and walnuts and is paying attention to food labels for  carbs, sugars, and trans fats.  She does eat wheat crackers for snack.  She has a treadmill at home that she has been using 2-3 times a week for  5-10 minutes.  She has increased her water intake and decreased her  fruit juice and lemonade consumption and is walking up and down her  driveway now on a regular basis with a small incline.  She has been  unable to exercise due to dizziness from a fall, this has resolved, so  she is going to be careful but is doing well.   LABORATORY DATA:  Labs on May 24, 2008, revealed total cholesterol  152, triglyceride 225, HDL 29.6, LDL is 70.  LFTs are within normal  limits.   ASSESSMENT:  The patient has not had muscle pain since switching from  Lipitor to Crestor.  Triglycerides have come down.  The patient has been  encouraged to continue to watch her carbohydrates, sugar, and fat intake  and continue her excellent habits of food label review.  The patient  will continue on her current medication.  She will continue her same  dietary therapy and will be encouraged to increase exercise every day to  at least 10 minutes trying to work up towards 30  minutes each day over  the next several months.  Followup is scheduled on September 09, 2008, at  11 o'clock.  I appreciate the opportunity to see this pleasant patient.       Shelby Dubin, PharmD, BCPS, CPP  Electronically Signed      [name]. Excell Seltzer, MD  Electronically Signed   MP/MedQ  DD: 05/28/2008  DT: 05/29/2008  Job #: [id number]

## 2011-01-19 NOTE — Op Note (Signed)
NAME:  [name] [name], [name] [name]                         ACCOUNT NO.:  [id number]   MEDICAL RECORD NO.:  [id number]                   PATIENT TYPE:  AMB   LOCATION:  [facility]                                 FACILITY:  [facility]   PHYSICIAN:  Sigmund I. Patsi Sears, M.D.         DATE OF BIRTH:  April 20, 1941   DATE OF PROCEDURE:  08/26/2003  DATE OF DISCHARGE:                                 OPERATIVE REPORT   PREOPERATIVE DIAGNOSIS:  Type 3 urinary incontinence.   POSTOPERATIVE DIAGNOSIS:  Type 3 urinary incontinence.   OPERATION:  Mentor pubovaginal sling with Tutoplast reinforcement.   SURGEON:  Sigmund I. Patsi Sears, M.D.   ANESTHESIA:  General LMA.   PREPARATION:  After appropriate preanesthesia, the patient is brought to the  operating room and placed on the operating table in dorsal supine position  where general LMA anesthesia was introduced.  She was then re-placed in  dorsal lithotomy position where the pubis was prepped with Betadine solution  and draped in the usual fashion.   DESCRIPTION OF PROCEDURE:  Evaluation under anesthesia revealed that the  patient had previously had sufficient anterior and posterior vaginal vault  repair.  Therefore, 6 mL of Marcaine 0.25% plain were injected into the  periurethral area, and a 1.5 cm incision was made at subcutaneous tissue  dissected bilaterally.  The tissue was quite thin, and there was very little  subcutaneous tissue surrounding the urethra.  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   Two separate stab wounds were then made 5 cm lateral to the vagina at the  level of the clitoris.  The pubovaginal sling was then placed in standard  fashion retrograde, through the obturator fossa.  Cystoscopy revealed that  there was no evidence of any bladder abnormality.  With the right-angle  clamp behind the sling at the midline, the wings of the sling were then cut  and retracted  subcutaneously.  Because there was so little tissue at the  level of the incision, it was elected to place a portion of Tutoplast  fascia, and a 4 x 7 cm portion to Tutoplast fascia was placed in antibiotic  solution, and a 2 cm strip of this tissue was used as reinforcement for the  pubovaginal sling.  With this coverage, the vaginal incision was closed in a  single layer with multiple 3-0 Vicryl sutures.  The patient tolerated the  procedure well under IV antibiotic coverage.  She was awakened after given  15 mg of IV Toradol and taken to the recovery room in good condition.                                               Sigmund I. Patsi Sears, M.D.    SIT/MEDQ  D:  08/26/2003  T:  08/26/2003  Job:  [id number]

## 2011-01-19 NOTE — Cardiovascular Report (Signed)
NAMECAMBRIE, [name] [name]               ACCOUNT NO.:  [id number]   MEDICAL RECORD NO.:  [id number]          PATIENT TYPE:  INP   LOCATION:  2038                         FACILITY:  [facility]   PHYSICIAN:  Bruce R. Juanda Chance, MD, FACCDATE OF BIRTH:  [birth year]   DATE OF PROCEDURE:  03/26/2006  DATE OF DISCHARGE:  03/29/2006                              CARDIAC CATHETERIZATION   CLINICAL HISTORY:  [name] [name] is 70 years old and has a history of  hypertension and mitral valve prolapse; had the onset of chest pain at 1100  and came to [facility] via EMS where EKG showed an acute  diaphragmatic wall infarction.  She is brought to the lab for intervention.   PROCEDURE:  The procedure was performed by myself and Dr. Calton Dach.  The  procedure was performed by the right femoral artery using an arterial sheath  and 6-French preformed coronary catheters.  After completion of diagnostic  study, I made a decision to proceed with intervention on the high-grade  obstruction in the mid distal right coronary artery.  The patient given  Angiomax bolus infusion and 300 mg of Plavix.  We used a 6-French JR-4  guiding catheter with side holes.  We crossed the lesion with a Prowater  wire without difficulty.  We predilated with a 2.5 x 20-mm Maverick  performing 1 inflation up to 8 atmospheres for 30 seconds.  We then deployed  a 2.75 x 28-mm Taxus stent deploying this with 1 inflation of 12 atmospheres  for 30 seconds.  We postdilated with a 3.25 x 20-mm Quantum Maverick  performing 2 inflations to 15 atmospheres for 30 seconds.  Final diagnostic  studies were then performed through the guiding catheter.  The patient  tolerated the procedure well and left the laboratory in satisfactory  condition.   RESULTS:  LEFT MAIN CORONARY ARTERY:  The left main coronary artery is free  of significant disease.   LEFT ANTERIOR DESCENDING ARTERY:  The left anterior descending artery gave  rise to a diagonal branch  and several septal perforators.  There was 50%  narrowing at the diagonal branch.   THE CIRCUMFLEX ARTERY:  Circumflex artery gave rise to a large marginal  branch and small AV branch.  There was 40% narrowing in the very proximal  circumflex artery.   THE RIGHT CORONARY ARTERY:  The right coronary artery is a moderate-sized  vessel and gave rise to a conus branch, a ventricle branch, a second acute  marginal branch which supplied part of the inferior septum, posterior  ascending branch and posterolateral branch.  There was 99% stenosis in the  mid-right coronary just before the acute marginal branch.   THE LEFT VENTRICULOGRAM:  Left ventriculogram performed in the RAO  projection showed hypokinesis of the inferior wall in the basal and  midportion.  Apex and anterolateral wall moved well.  Estimated ejection  fraction of 55%.   Following stenting of the lesion in the mid-right coronary, stenosis  improved from 99% to 0%.  The flow improved.  There was a distal embolus in  the acute marginal branch  supplying a distal portion of the inferior wall.   CONCLUSION:  1. Acute diaphragmatic wall infarction with 99% stenosis in the mid-right      coronary artery, 50% narrowing in the proximal LAD, 40% narrowing in      the proximal circumflex artery, and inferior wall hypokinesis with an      estimated ejection fraction of 55%.  2. Successful PCI of the lesion in the mid-right coronary artery using a      Taxus drug-eluting stent with improvement in stenosis from 99% to 0%.   The patient had the onset of chest pain at 11:00, and the first ECG was  obtained at 11:58 by EMS.  Patient arrived in the emergency room as 12:02,  and arrived in the cath lab at 12:57.  The first balloon placement was at  12:53.  This gave a door-balloon time of 51 minutes and reperfusion time of  1 hour and 53 minutes.   Patient returned __________ unit for further observation.            ______________________________  Everardo Beals Juanda Chance, MD, [facility]     BRB/MEDQ  D:  05/28/2006  T:  05/30/2006  Job:  [id number]   cc:   Luis Abed, MD, [facility]  Bruce R. Juanda Chance, MD, [facility]  Cardiopulmonary Lab  [place]. Kriste Basque, MD

## 2011-01-19 NOTE — Assessment & Plan Note (Signed)
[facility]                         GASTROENTEROLOGY OFFICE NOTE   NAME:[name], [name] WETTSTEIN                      MRN:          [id number]  DATE:12/11/2006                            DOB:          Mar 15, 1941    The patient comes in on April 9, but complained of mid-abdominal pains,  persistent, complains of reflux.  Zegerid seems to have helped some  since her last visit.  She is taking Arthrotec along with multiple other  medicines.  An ultrasound was done which did not show anything.   OTHER MEDICINES:  1. Metformin.  2. Lipitor.  3. Plavix.  4. Metoprolol.  5. Hyoscyamine.  6. Arthrotec 75 mg q.6 h.  7. Cyclobenzaprine.  8. Lyrica.  9. Aspirin.  10.Vitamins.  11.Darvocet.  12.Bilberry 60 mg b.i.d.   The patient is still complaining of GERD.   PHYSICAL EXAMINATION:  She weighed 149.  Blood pressure 142/80, pulse 80  and regular.  HEENT:  Eyes and conjunctivae are unremarkable.  EOMI.  PERRLA.  Sclerae  are anicteric.  NECK:  Supple without thyromegaly, adenopathy or carotid bruits.  CHEST:  Clear to auscultation and percussion without adventitious  sounds.  CARDIAC:  Regular rhythm; normal S1, S2.  There are no murmurs, gallops  or rubs.  ABDOMEN:  Bowel sounds are normoactive.  Abdomen is soft, non-tender and  non-distended.  There are no abdominal masses, tenderness, splenic  enlargement or hepatomegaly.  EXTREMITIES:  Full range of motion.  No cyanosis, clubbing or edema.  RECTAL:  There are no masses.  Stool is Hemoccult negative.   IMPRESSION:  1. Gastroesophageal reflux disease with breakthrough reflux.  2. Arteriosclerotic cardiovascular disease.  3. Anxiety and depression.  4. History of irritable bowel syndrome.  5. History of __________  gastritis.  6. Hypertension.  7. Hyperlipidemia.   RECOMMENDATION:  I put her on some Nexium instead of the Zegerid and  scheduled her for an EGD because of the Arthrotec and her  persistent  abdominal pain, since the CT of her  abdomen was reported as being within normal limits.  She was thus  scheduled for an endoscopy on December 17, 2006.     Ulyess Mort, MD  Electronically Signed    SML/MedQ  DD: 12/11/2006  DT: 12/12/2006  Job #: [id number]

## 2011-01-19 NOTE — Assessment & Plan Note (Signed)
[facility]                               LIPID CLINIC NOTE   NAME:[name] [name], [name] [name]                      MRN:          [id number]  DATE:09/09/2008                            DOB:          August 08, 1941    The patient is seen in the Lipid Clinic for further evaluation and  medication titration associated with her hyperlipidemia in the setting  of diabetes.  She has been feeling and doing well overall.  She has had  no muscle aches, pain, weakness, fatigue, or other problems associated  with her Crestor 20 mg daily therapy and niacin 500 mg twice daily.  She  has had past medical history pertinent for CAD, inferior wall myocardial  infarction in 2007 treated with drug-eluting stent to the RCA, left  ventricular function is preserved at 60% as demonstrated on the echo in  December 2008.  She continues to follow a low-fat, low-cholesterol diet.  Her walking activity has been somewhat lessened by cold weather, but is  picking up nicely.  She had a good holiday season.   Past medical history is as noted.   Current medications include:  1. Metformin 500 mg tablets one-half twice daily.  2. Plavix 75 mg daily.  3. Cyclobenzaprine 10 mg twice daily.  4. Calcium plus D 600 mg twice daily.  5. Cod liver oil daily.  6. Niacin 500 mg twice daily.  7. Dicyclomine 10 mg twice daily.  8. Metoprolol 100 mg twice daily.  9. Omega-3 fish oil 2 tablets twice daily.  10.Flaxseed oil twice daily.  11.Aspirin 81 mg daily.  12.Omeprazole 20 mg twice daily.  13.Glucosamine.  14.Multivitamin.  15.Aloe vera.  16.Lyrica 75 mg 3 times daily.  17.Furosemide 20 mg daily.  18.Coenzyme Q10 daily.  19.Crestor 20 mg daily.   Drug allergies include SULFA, TETRACYCLINE, and PREDNISONE.   PHYSICAL EXAMINATION:  Weight today in the office is 152 pounds.  Blood  pressure is 120/72, heart rate is 72.   Labs to reveal normal LFTs.  Total cholesterol 137, triglyceride 203,  HDL  38.8, LDL 58.1.   ASSESSMENT AND PLAN:  I have congratulated the patient on her good labs  and her good work.  I have asked her to continue to pay attention to her  dietary intake and continue on her current therapies.  She will work to  increase her exercise therapy and do her best to improve on her dietary  intake to continue that in this good fashion.  She will follow up in 6  weeks.  She will call with muscle aches, pain, weakness, fatigue, or  other problems in the meantime.      Shelby Dubin, PharmD, BCPS, CPP  Electronically Signed      [name]. Excell Seltzer, MD  Electronically Signed   MP/MedQ  DD: 09/16/2008  DT: 09/16/2008  Job #: [id number]

## 2011-01-19 NOTE — Assessment & Plan Note (Signed)
[facility]                         GASTROENTEROLOGY OFFICE NOTE   NAME:[name], [name] MARCOM                      MRN:          [id number]  DATE:11/13/2006                            DOB:          27-Apr-1941    [name] [name] comes in on November 13, 2006, complaining of acid reflux, hurting  in stomach, feels like when she had pancreatitis in the past and taking  Prilosec does not really help much.  Nexium helped more.  She has been  tender in her epigastric region, intermittent.  No blood, no nausea or  vomiting, but a lot of heartburn.  Her mother had pancreatic cancer and  I think she is quite concerned about this.  She is status post  cholecystectomy.  I did a colonoscopic examination in 2004 and this  revealed some minimal diverticular disease and otherwise was negative.  She did have an upper endoscopic examination in 1991 which revealed a 3-  cm hiatal hernia with stricture and alkaline gastritis.  [name] [name] is on  multiple medications including Nexium, metformin, Lipitor, Plavix,  metoprolol, hyoscyamine, Arthrotec, cyclobenzaprine, Lyrica, CalciBoost,  aspirin, niacin, and flax seed.  She is status post acute inferior  myocardial infarction in July 2007.  It was noted in October that she  was having some epigastric pain which was relieved with sublingual  nitroglycerin at the time.  It was felt she was stable from a  cardiovascular standpoint by Dr. Tonny Bollman.  Past medical history  reveals also some hypertension, hyperlipidemia, arthritis, allergies,  appendectomy, partial hysterectomy, bladder repair, status post  cholecystectomy, anal fissure, and double hernia surgery.  She has had  anxiety/depression in the past as well.   On physical examination, she weighed 147, blood pressure 120/70, pulse  82 and regular.  Neck and extremities are unremarkable.  Rectal was  deferred.   IMPRESSION:  1. Gastroesophageal reflux disease with breakthrough acid  reflux.  2. Arteriosclerotic cardiovascular disease.  3. Hyperlipidemia.  4. Anxiety/depression.  5. Irritable bowel syndrome.  6. Alkaline gastritis.  7. Hypertension.   Recommendation is that she try some Zegerid b.i.d., Carafate slurry, and  we ordered her CT scan of her abdomen and pelvis and some routine labs  on her with an amylase and lipase.  It should be noted that the  laboratory studies returned at the time of this dictation her amylase  and lipase were normal and her other labs were normal except for her  glucose being 167.     Ulyess Mort, MD  Electronically Signed    SML/MedQ  DD: 11/13/2006  DT: 11/15/2006  Job #: [id number]

## 2011-01-19 NOTE — Discharge Summary (Signed)
[name] [name], [name] [name]               ACCOUNT NO.:  [id number]   MEDICAL RECORD NO.:  [id number]          PATIENT TYPE:  INP   LOCATION:  2038                         FACILITY:  [facility]   PHYSICIAN:  Micheline Chapman, MD   DATE OF BIRTH:  [birth year]   DATE OF ADMISSION:  03/26/2006  DATE OF DISCHARGE:  03/28/2006                                 DISCHARGE SUMMARY   PROCEDURES:  1.  Cardiac catheterization.  2.  Coronary arteriogram.  3.  Left ventriculogram.  4.  PTCA and Taxus stent to one vessel.   TIME AT DISCHARGE:  42 minutes.   PRIMARY DIAGNOSIS:  Inferior ST elevation myocardial infarction.   SECONDARY DIAGNOSES:  1.  Hyperlipidemia/dyslipidemia with a total cholesterol of 121,      triglycerides 157, HDL 28, LDL 62 (Lipitor increased to 40-80 mg daily).  2.  Type 2 diabetes.  3.  History of hypertension.  4.  History of mitral valve prolapse.  5.  ALLERGY OR INTOLERANCE TO SULFA, ZESTRIL, TETRACYCLINE AND POSSIBLY      PREDNISONE.  6.  Family history of coronary artery disease.  7.  Seasonal allergies.  8.  Recent upper respiratory infection.  9.  Chronic pain issues on Lyrica.  10. History of B12 deficiency.  11. History of pancreatitis x2.  12. Status post tonsillectomy, thumb surgery, cholecystectomy, bladder tack,      hysterectomy, neck surgery and temporomandibular joint surgery.  13. History of anxiety and depression.  14. Gastroesophageal reflux disease.  15. Irritable bowel syndrome/lactose intolerance.  16. History of anal fissures.   HOSPITAL COURSE:  [name] [name] is a 70 year old female with no previous history  of coronary artery disease, although she has multiple cardiac risk factors.  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  Her EKG had  some inferolateral changes, and she was taken urgently to the cath lab.   [name] [name] had a 99% RCA that was treated with  PTCA and a Taxus stent,  reducing the stenosis to zero.  In a distal branch there was a question of a  distal embolus.  She had a 40% circumflex and a 50% LAD for which medical  therapy is recommended.  Her EF was 55%.   A lipid profile was checked with the results described above.  Because of a  low HDL, the Lipitor was increased from 40 to 80 mg a day.  She takes over-  the-counter niacin as well, and is to continue this.   Her peak cardiac enzymes were 898/114 with a peak troponin I of 17.94.  Postprocedure they trended down.  She had some mild chest pain overnight,  but it resolved with sublingual nitroglycerin.  Her diuretic was  discontinued and she was started on a beta blocker.  Her Glucophage was held  and she was put on sliding scale insulin.   [name] [name] was seen by cardiac rehab, and educated on MI restrictions as well  as cardiac risk factors, and calling 9-1-1.  She is to start walking at  home, per cardiac rehab guidelines, and is referred to outpatient cardiac  rehab as well.   By March 28, 2006, [name] [name] was ambulating without chest pain or shortness  of breath.  Her sodium had dropped slightly post procedure, to 131.  Her IV  fluids was stopped and her sodium was 132 at discharge.  This is to be  rechecked as an outpatient..   On March 28, 2006, [name] [name] was evaluated by Dr. Excell Seltzer, and considered  stable for discharge with outpatient follow-up arranged.  She is to get  follow up lipids and LFTs in 12 weeks, and a BMET at the time of her first  office visit.   DISCHARGE INSTRUCTIONS:  1.  Her activity level is to be increased gradually per cardiac rehab      guidelines.  2.  She is to stick to a low-fat diabetic diet.  3.  She is to call our office for problems with the cath site.  4.  She is to hold the Glucophage until March 29, 2006.  5.  She is to stop her diuretics and the hormone supplementation.  6.  She is to follow up with Dr. Excell Seltzer on April 09, 2006, at  1:45, and Dr.      Kriste Basque as well.   DISCHARGE MEDICATIONS:  1.  Glucophage 750 mg b.i.d.  2.  Lipitor 80 mg q. day.  3.  Aspirin 325 mg daily.  4.  Plavix 75 mg q. day.  5.  Metoprolol 25 mg b.i.d.  6.  Nitroglycerin sublingual p.r.n..  7.  Lyrica 60 mg b.i.d.  8.  Hyoscyamine 0.375 mg q. day.  9.  Nexium 40 mg q. day.  10. Arthrotec 75 mg q. day.  11. Guaifenesin 600 grams b.i.d. p.r.n.  12. Cyclobenzaprine 10 mg t.i.d. p.r.n.  13. Claritin 10 mg q. day.      Theodore Demark, P.A. [facility]      Micheline Chapman, MD  Electronically Signed    RB/MEDQ  D:  03/28/2006  T:  03/28/2006  Job:  [id number]   cc:   Lonzo Cloud. Kriste Basque, M.D. [facility]

## 2011-01-19 NOTE — Op Note (Signed)
NAME:  [name] [name], [name] [name]                         ACCOUNT NO.:  [id number]   MEDICAL RECORD NO.:  [id number]                   PATIENT TYPE:  INP   LOCATION:  3014                                 FACILITY:  [facility]   PHYSICIAN:  Clydene Fake, M.D.               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   PROCEDURE:  Anterior cervical discectomy and fusion C4-C5, C5-C6 and C6-C7  with allograft and anterior cervical plate, C4 through C7.   SURGEON:  Clydene Fake, M.D.   ASSISTANTChanning Mutters   ANESTHESIA:  General endotracheal anesthesia.   ESTIMATED BLOOD LOSS:  Minimal, no blood given.   DRAINS:  None.   COMPLICATIONS:  None.   INDICATIONS FOR PROCEDURE:  The patient is a 70 year old woman who had a  posterior fusion C2 through C4 in 1996 who has had chronic neck pain since  and presents with two month history of worsening neck and left arm pain, the  pain runs into her fingers, and she complains of numbness and tingling.  Workup included an MRI and a myelogram showing severe spondylosis at C4-C5,  C5-C6, and C6-C7 with a larger disc herniation on the left side at C6-C7.  The patient is brought in for three level anterior cervical discectomy and  fusion.   PROCEDURE IN DETAIL:  The patient was brought to the operating room, general  anesthesia was induced, the patient was placed in 10 pounds of upward  traction, was prepped and draped in the usual sterile fashion.  The site of  incision was injected with 10 cc of 1% lidocaine with epinephrine.  The  incision was then made from the midline to the anterior border of the  sternocleidomastoid muscle on the left side of the neck.  The incision was  taken down to through the platysma and hemostasis obtained with Bovie  cautery.  The platysma was incised with the Bovie and blunt dissection was  taken through the anterior cervical fascia to the anterior cervical spine.  A needle was placed in the disc space and x-ray was obtained assuring this  was the C4-C5 interspace.  The disc space was incised with a 15 blade, the  needle was removed, and partial discectomy performed with pituitary  rongeurs.  The longus colli muscle was then reflected laterally on each side  using the Bovie from C4 through C7, the C5-C6 and C6-C7 disc spaces were  incised and partial discectomies performed.  Self-retaining retractor was  then placed and we proceeded at C4-C5 and C5-C6.   Distraction pins were placed into C4 and C5 and distractor was placed over  the pins  and the interspace distracted gently.  The microscope was then  brought in for the microdissection.  Discectomy performed with the curets,  pituitary rongeurs, and 1 mm Kerrison punches.  The posterior longitudinal  ligament was removed and bilateral foraminotomies were performed.  When we  were finished, we had good decompression of the cord and bilateral foramen.  A high speed drill was used to remove the cartilaginous endplates and the  depth of the vertebral body was then measured.  Hemostasis was obtained with  Gelfoam and thrombin, the Gelfoam was then irrigated out.  The 7 mm bone was  then cut to be 2 mm shorter in depth and then tapped into place and counter  sunk about 1 mm.  We could put a nerve hook behind the bone and there was  plenty of room between bone and dura.  Distraction was removed and the pin  was removed from C4, hemostasis was obtained there with Gelfoam and thrombin  and the pin was placed into C5.  Distractor was replaced over the pins and  the C5-C6 interspace distracted.  The disc space was incised with a 15 blade  and discectomy performed with pituitary rongeurs,  curets, 1 and 2 mm  Kerrison punches were used to remove the posterior osteophytes and the  posterior longitudinal ligaments and to perform bilateral foraminotomies.  Once we had good decompression of the cord and bilateral foramen, we used a  high speed drill to remove the cartilaginous endplates.  The depth of the  vertebral body was measured and a 6 mm bone was then cut to be 2 mm shorter  in length and tapped into place.  We again checked behind the bone and there  was room between the bone and dura.  The distractor was removed again and  the distraction pin was removed from 5 and placed in C7.  The distractor was  brought back in and put over the pins and the C6-C7 disc space was  distracted gently.  Discectomy was performed here with the same technique,  the posterior longitudinal ligament was removed and bilateral foraminotomies  were preformed on the left side.  After foraminotomy, we saw some fragments  of disc and using the hook, we removed free fragments of disc that were out  even furhter laterally decompressing the roots and the sides.  When we had  good decompression, we used a 7 mm bone plug cut 2 mm shorter in size and  tapped into place.  We checked behind it and there was room between bone and  dura.   We then removed all the distraction pins.  Hemostasis at all the wounds was  obtained with Gelfoam and thrombin.  The 10 pound traction was then removed  from the halter traction at this point.  A 49 mm tether plate was then  placed from C4 to C7 and two screws were placed into C4 and two into C7.  Our bone plugs were nice and tight.  At this point, we placed to more  screws, one in C5 and one in C6.  We tightened all the screws and then the  lateral x-ray was obtained showing good position of plate, screws, and bone  plugs at all levels.  The wound was irrigated with antibiotic solution, retractor was removed, Gelfoam and thrombin was placed in the incision and   hemostasis was obtained with bipolar cauterization.  Gelfoam was then  irrigated out.  We had very good hemostasis and the platysma  was closed with  3-0 Vicryl interrupted sutures, the subcutaneous tissue were closed with the  same, the skin was closed with Benzoin and Steri-Strips.  An Aspen sterile  collar was placed.  The patient was awakened from anesthesia and transferred  to the recovery room in stable condition.                                               Clydene Fake, M.D.    JRH/MEDQ  D:  04/06/2002  T:  04/09/2002  Job:  [id number]

## 2011-01-19 NOTE — Op Note (Signed)
NAME:  [name] [name], [name] [name]                         ACCOUNT NO.:  [id number]   MEDICAL RECORD NO.:  [id number]                   PATIENT TYPE:  AMB   LOCATION:  NESC                                 FACILITY:  [facility]   PHYSICIAN:  Sigmund I. Patsi Sears, M.D.         DATE OF BIRTH:  05-23-1941   DATE OF PROCEDURE:  09/13/2003  DATE OF DISCHARGE:                                 OPERATIVE REPORT   PREOPERATIVE DIAGNOSIS:  Urethral obstruction.   POSTOPERATIVE DIAGNOSIS:  Urethral obstruction.   OPERATION:  Transvaginal incision of transobturator tape in Tutoplast  fascia.   SURGEON:  Sigmund I. Patsi Sears, M.D.   ANESTHESIA:  General LMA.   PREPARATION:  After appropriate preanesthesia, the patient was brought to  the operating room and placed on the operating table in the dorsal supine  position where general LMA anesthesia was introduced.  She was then replaced  in the dorsal lithotomy position where the pubis was prepped with Betadine  solution and draped in the usual fashion.   DESCRIPTION OF PROCEDURE:  The previously placed vaginal sutures were  removed.  The Tutoplast fascia was identified and incised to the midline.  The transobturator tape was identified and incised in the midline.  The  right side portion of the Tutoplast fascia was removed.  This afforded a  better area for closure of the vaginal tissue.  This was closed in a single  layer with interrupted 3-0 and 4-0 Vicryl suture.  The patient tolerated the  procedure well with minimal bleeding.  She was awaken and taken to the  recovery room in good condition.                                               Sigmund I. Patsi Sears, M.D.    SIT/MEDQ  D:  09/13/2003  T:  09/13/2003  Job:  [id number]

## 2011-01-19 NOTE — H&P (Signed)
[name], [name]               ACCOUNT NO.:  [id number]   MEDICAL RECORD NO.:  [id number]          PATIENT TYPE:  INP   LOCATION:  1826                         FACILITY:  [facility]   PHYSICIAN:  Willa Rough, M.D.     DATE OF BIRTH:  04-28-1941   DATE OF ADMISSION:  03/26/2006  DATE OF DISCHARGE:                                HISTORY & PHYSICAL   PRIMARY CARE PHYSICIAN:  Lonzo Cloud. Kriste Basque, M.D. [facility]   HISTORY OF PRESENT ILLNESS:  [name] [name] is a very pleasant 70 year old female  with no known coronary artery disease history who presented to [facility] emergency room on this date at approximately 12 noon with  complaints of chest pain that started 1 hour prior to arrival.  [name] [name]  states she was in the kitchen organizing some of her cabinets when she had  sudden onset of generalized weakness, became slightly diaphoretic.  She  experienced some substernal chest pain that was very intensely acute.  It  continued to progress without any relief.  The patient presented to [facility] complaining of chest pain on a scale of 1-10 out of 8.  She  states she just had a funny feeling all over and bilateral arm weakness.  She is currently rating her pain as an 8 still.  Ms. Kruser denies any past  cardiac workup.  She states that she had a stress test years ago secondary  to mitral valve prolapse and some generalized weakness.   PAST MEDICAL HISTORY:  Mitral valve prolapse, mild hypertension, type 2  diabetes, denies any thyroid problems, cholesterol status unknown at this  time.   ALLERGIES:  SULFA, TETRACYCLINE, questionable allergy to PREDNISONE.   MEDICATIONS:  1.  Blood pressure pill, the patient cannot recall name.  2.  Metformin 250 mg p.o. b.i.d.   SOCIAL HISTORY:  She lives in [place] with her husband.  She has never  smoked tobacco.  Denies any EtOH, drug, or herbal medication use.  No diet  restrictions.   FAMILY HISTORY:  Mother with some form of heart  ailments.  The patient is  unsure what.  The patient states that she has never known her father.  She  has two siblings with known coronary artery disease.   REVIEW OF SYSTEMS:  Positive for diaphoresis, chest pain, generalized  weakness, and all other systems negative per patient.   PHYSICAL EXAMINATION:  VITAL SIGNS:  Pulse 86, respirations 20, blood  pressure 150/94, the patient is saturating 98% on 2 liters.  GENERAL:  She is in no acute distress.  Very quiet and anxious.  She is  currently rating her pain around 8.  HEENT:  Normocephalic and atraumatic.  Pupils equal, round, and reactive to  light.  Sclerae clear.  NECK:  Supple without lymphadenopathy, negative bruit, negative JVD.  CARDIOVASCULAR:  Regular rate and rhythm, S1 and S2.  Pulses are 2+ and  equal without bruits.  LUNGS:  Clear to auscultation bilaterally.  ABDOMEN:  Soft and nontender with positive bowel sounds.  EXTREMITIES:  Lower extremities is without cyanosis, clubbing,  or edema.  NEUROLOGY:  The patient is alert and oriented x3.  Cranial nerves II-XII  grossly intact.   Chest x-ray; results pending.   EKG; rate of 85, sinus rhythm.  Initial EKG done at 12:01 shows some mild ST  elevation in lateral leads and inferior leads.  However, the patient  continued to have chest pain at a rate of 8.  Repeat EKG done at 12:11 shows  maybe slightly more pronounced ST elevation in V4 through V6 with ongoing  chest discomfort and more pronounced ST elevation in inferior leads.   LABORATORY DATA:  Hemoglobin 15.6, hematocrit 46, potassium 4.9, glucose  150, BUN 17, creatinine 1.1.   Dr. Myrtis Ser in to examine and assess the patient with ongoing chest pain in the  setting of EKG changes, most likely ST elevated MI.  Cardiac enzymes are  pending at this time.  The patient with ongoing chest pain is being taken  urgently to the catheterization lab for further evaluation by Charlies Constable,  M.D. [facility].  En route the patient has  received aspirin, heparin bolus,  nitroglycerin.      Dorian Pod, NP    ______________________________  Willa Rough, M.D.    MB/MEDQ  D:  03/26/2006  T:  03/26/2006  Job:  [id number]

## 2011-01-19 NOTE — Assessment & Plan Note (Signed)
[facility]                              CARDIOLOGY OFFICE NOTE   NAME:[name] [name], [name] [name]                      MRN:          [id number]  DATE:04/09/2006                            DOB:          1941-03-09   PRIMARY CARDIOLOGIST:  Micheline Chapman, MD.   PATIENT PROFILE:  A 70 year old white female who is recently status post  inferior ST elevation MI who presents for hospital follow up.   PROBLEM LIST:  1.  Coronary artery disease.      1.  March 26, 2006, inferior ST elevation myocardial infarction.      2.  March 26, 2006, cardiac catheterization revealing 99% stenosis in the          right coronary artery which was successfully treated with a 2.75 x          28 mm TAXUS drug-eluting stent.  She was enrolled in the [place]          Trial during the catheterization period; otherwise, coronary anatomy          showed a normal left main, a 50% proximal left anterior descending          stenosis, a 40% ostial left circumflex stenosis.  Ejection fraction          was 55%.  2.  Hyperlipidemia.  3.  Hypertension.  4.  History of mitral valve prolapse.  5.  Seasonal allergies.  6.  Chronic pain, on Lyrica.  7.  History of B12 deficiency.  8.  History of pancreatitis times two.  9.  History of anxiety and depression.  10. Gastroesophageal reflux disease.  11. Irritable bowel syndrome.  12. Lactose intolerance.  13. History of anal fissures.  14. Status post tonsillectomy, thumb surgery, cholecystectomy, bladder tack,      hysterectomy, neck surgery, and temporomandibular joint surgery.   HISTORY OF PRESENT ILLNESS:  A 70 year old white female who had no prior  history of coronary disease when she presented to the [facility] ED on March 26, 2006, with chest pain that radiated in both arms, associated with  weakness.  EKG showed inferolateral ST segment elevation.  She was taken to  the cardiac catheterization laboratory where catheterization revealed  a 99%  stenosis in the proximal RCA which was successfully treated with a TAXUS  drug-eluting stent as outlined in the problem list.  She otherwise had  nonobstructive coronary disease.  Her hospitalization was otherwise  uneventful and she was discharged home on July 26th.  Since her discharge  she has done well without any recurrent chest pain or shortness of breath.  She is walking at least 30 minutes daily without limitations.  She denies  any PND, orthopnea, dizziness, syncope, edema or early satiety.   CURRENT MEDICATIONS:  1.  Aspirin 325 mg daily.  2.  Nexium 40 mg daily.  3.  Metformin 500 mg 1/2 tablet b.i.d.  4.  Lipitor 80 mg q.h.s.  5.  Plavix 75 mg daily.  6.  Acebutolol 200 mg daily.  7.  Metoprolol 25 mg b.i.d.  8.  Hyoscyamine ER 0.375 mg daily.  9.  Arthrotec 75 mg b.i.d.  10. Flexeril 10 mg b.i.d.  11. Lyrica 50 mg b.i.d.  12. Multiple vitamin supplements including Niacin 500 mg b.i.d., cinnamon      Omega 3 fish oil b.i.d., glucosamine, MSN and chondroitin, Aloe Vera,      cod liver oil, Vitamin C, flaxseed oil, Super Stress B Complex, Complete      multivitamin, Calci-Boost Plus D 400 mg b.i.d. and bilberry 60 mg b.i.d.   PHYSICAL EXAM:  Blood pressure 122/74, heart rate 74, respirations 16, she  is afebrile, weight is 149 pounds.  A pleasant white female in no acute  distress.  Awake, alert, oriented x3.  NECK:  Normal carotid upstrokes, no bruits, no JVD.  LUNGS:  Respirations regular, nonlabored, clear to auscultation.  CARDIAC:  Regular S1, S2, no S3, no S4 or murmurs.  ABDOMEN:  Round, soft, nontender, nondistended.  Bowel sounds __________ x4.  EXTREMITIES:  Warm, dry, pink, no clubbing, cyanosis or edema.  Dorsalis  pedis, posterior tibial pulses 2+ and equal bilaterally.  The right groin  site, which was used for cath and PCI, was ecchymotic without any bleeding,  bruits or hematoma.   ACCESSORY CLINICAL FINDINGS:  EKG shows sinus rhythm with heart  rate of 74  and deep T-wave inversion in leads 2, 3, AVF, V5 and V6 with inferior Qs.   ASSESSMENT AND PLAN:  1.  Coronary artery disease.  She is recently status post acute inferior ST      elevation myocardial infarction with successful percutaneous coronary      intervention, implanting of the right coronary artery with a TAXUS drug-      eluting stent.  She remains asymptomatic and tolerates her medicines      well, which include an aspirin, beta-blocker, Plavix and Statin.  Will      make no changes today.  2.  Hypertension.  This is well controlled, continue current regimen.  3.  Hyperlipidemia.  Her LDL in the hospital was 62 with total cholesterol      121 and an HDL of 28.  She remains on Lipitor as well as Niacin.  4.  Type 2 diabetes mellitus followed by primary care.  She remains on      metformin therapy.   DISPOSITION:  The patient will follow up with Dr. Excell Seltzer in 3 months or  sooner if necessary.                               Nicolasa Ducking, ANP                            Luis Abed, MD, [facility]   CB/MedQ  DD:  04/09/2006  DT:  04/09/2006  Job #:  [id number]

## 2011-01-19 NOTE — Assessment & Plan Note (Signed)
[facility]                              CARDIOLOGY OFFICE NOTE   NAME:Abrams, [name] [name]                      MRN:          [id number]  DATE:06/07/2006                            DOB:          04-03-1941    [name] [name] is a very pleasant 70 year old woman who is status post acute  inferior myocardial infarction back in July of this year.  She was treated  with a Taxus drug eluting stent at that time.  Her ejection fraction was  intact at 55%.  She has done well since the time of her myocardial  infarction.  She reports regular exercise on a stationary bike or treadmill  several days per week.  She also goes for a walk every day.  She has had no  symptoms with exertion.  She does note a few episodes of epigastric  discomfort that had been relieved with sublingual nitroglycerin.  These have  not occurred in the last several weeks.  She has some palpitations that she  has had for many years and it attributed to mitral valve prolapse.  She has  no other complaints at this time.  She specifically denies dyspnea,  orthopnea, paroxysmal nocturnal dyspnea, lightheadedness or syncope.   CURRENT MEDICATIONS:  1. Nexium 40 mg daily.  2. Metformin 250 mg twice daily.  3. Lipitor 80 mg daily.  4. Plavix 75 mg daily.  5. Metoprolol 25 mg twice daily.  6. Hyoscyamine 0.375 mg daily.  7. Arthrotec 75 mg daily.  8. Cyclobenzaprine 10 mg twice daily.  9. Lyrica 50 mg twice daily.  10.Calciboost Plus D 400 mg twice daily.  11.Aspirin 325 mg daily.  12.Niacin 500 mg twice daily.  13.Flax seed oil.   PHYSICAL EXAMINATION:  GENERAL:  On exam today she is alert and oriented, in  no acute distress.  VITAL SIGNS:  Her weight is 145 pounds, blood pressure is 136/77, heart rate  65, respiratory rate 12.  NECK:  Normal carotid upstrokes without bruits.  Jugular venous pressure is  normal.  LUNGS:  Clear to auscultation bilaterally.  HEART:  The apex is discrete and  nondisplaced.  Her heart is regular rate  and rhythm without murmurs or gallops.  ABDOMEN:  Soft and nontender.  No organomegaly.  EXTREMITIES:  No cyanosis, clubbing or edema.  Peripheral pulses are 2+ and  equal throughout.  The skin is warm and dry without rash.   ASSESSMENT:  Ms. Merta is currently stable from a cardiovascular standpoint.  Her cardiovascular problem list is as follows:  1. Coronary artery disease, status post inferior ST segment elevation in      July of this years.  She was treated with a Taxus drug eluting stent      and has mild to moderate disease in her proximal LAD and left      circumflex as well.  She also has a preserved left ventricular ejection      fraction.  Recommend:  As the patient is maintaining an excellent      exercise regimen and has no symptoms with activity, recommend  continued      medical therapy with aspirin, Plavix and combination therapy for her      dyslipidemia that includes high dose atorvastatin and niacin.  Will not      make any changes at this time to the patient's medical regimen.  2. Dyslipidemia.  The patient is on high dose atorvastatin.  She was      started on niacin for her low HDL in addition. Her monitoring and      lipids are followed by Dr. Kriste Basque.  3. Hypertension.  Currently well controlled on metoprolol.   Ms. Oros is doing well.  I would like to see her back on a yearly basis for  her regular cardiac follow-up.  I do not see any reason to do any cardiac  functional testing as she is asymptomatic.  She has a preserved left  ventricular ejection fraction at the time of cath, so there is no need to re-  evaluate her left ventricular function with echocardiography.  If she  develops any symptoms, it would be reasonable to stress her as she did have  some disease in her LAD in her left circumflex that appeared nonobstructive  at the time of her catheterization.   The patient was advised to contact our office if she has  any problems in the  interim.       [name]. Excell Seltzer, MD     MDC/MedQ  DD:  06/07/2006  DT:  06/10/2006  Job #:  [id number]   cc:   Lonzo Cloud. Kriste Basque, MD

## 2011-01-19 NOTE — Cardiovascular Report (Signed)
[name] [name], [name] [name]               ACCOUNT NO.:  [id number]   MEDICAL RECORD NO.:  [id number]          PATIENT TYPE:  INP   LOCATION:  2912                         FACILITY:  [facility]   PHYSICIAN:  Charlies Constable, M.D. [facility] DATE OF BIRTH:  [birth year]   DATE OF PROCEDURE:  03/26/2006  DATE OF DISCHARGE:                              CARDIAC CATHETERIZATION   PRIMARY CARE PHYSICIAN:  Lonzo Cloud. Kriste Basque, M.D.   CARDIOLOGIST:  New (Dr. Myrtis Ser and Dr. Excell Seltzer).   CLINICAL HISTORY:  Ms. Knueppel is 70 years old and has diabetes, hypertension,  but no prior history of heart disease except for mitral valve prolapse.  She  developed the onset of chest pain at 1100 today and came to the emergency  room by EMS.  She was seen by Dr. Beverely Pace and he called a Code STEMI and she  was transported promptly to the catheterization lab.   PROCEDURE:  The procedure was performed by Dr. Excell Seltzer and myself.  Procedure  was performed by the right femoral artery using an arterial sheath and 6-  [place] preformed coronary catheters.  We did a diagnostic study of the left  and then proceeded with a diagnostic study of the right with the guide  catheter.  This showed 99% stenosis in the mid right coronary artery with a  large thrombus.  We debated about considering aspiration thrombectomy but  elected to proceed with balloon dilatation.  We passed a [place] wire  across the lesion and used a 2.5 x 20 mm Maverick balloon and performed two  inflations up to 8 atmospheres for 30 seconds.  This resulted in good flow  down the artery.  I believe it was at this point that there was recognized  to be a distal embolus in the right ventricular branch.  This was a fairly  small vessel and we did not feel this needed to be treated.  We then went in  with a 2.75 x 28 mm Taxus stent and deployed this with one inflation of 16  atmospheres for 30 seconds.  We crossed the right ventricular branch with  the stent.  We then postdilated with a 3.25  x 20 mm Quantum Maverick  performing two inflations up to 12 atmospheres for 30 seconds.  Final  diagnostic study was then performed through the guiding catheter.   The patient was initially given 5000 units of heparin the emergency  department and the initial ACT was 298.  After we took the picture of the  right coronary we elected to use double-bolus Integrilin infusion.  The  patient was also enrolled in the [place] trial and was randomized either to  cangrelor or Plavix given just before PCI.  She will be continued on Plavix  in both arms of the study after the procedure.   RESULTS:  The left main coronary artery is free of significant disease.   The left anterior descending artery gave rise to a diagonal branch and three  septal perforators.  There is 50% narrowing just after the diagonal branch  in the proximal LAD.   The circumflex artery is  a small vessel that gave rise to a posterolateral  branch.  There is 40% ostial stenosis.   The right coronary artery was a very large vessel that gave rise to a conus  branch, a right ventricle branch, a second acute marginal branch which  supplied part of the inferior septum, a posterior descending branch and  posterolateral branch.  There was 99% stenosis in the mid to distal vessel  which ended just at the right ventricular branch.  There was TIMI 3 flow  distally.   The left ventriculogram performed in the RAO projection showed hypokinesis  of the inferior wall.  The overall wall motion was good with an estimated  fraction of 55%.  Following stenting of the lesion in the mid right coronary  artery, stenosis improved from 99% to 0% and the flow was TIMI 3 before and  after intervention.  There was an embolus in the distal portion of the right  ventricular branch which appeared to be a small vessel.   PRESSURES:  The aortic pressure was 143/81 with mean of 110 and the left  ventricular pressure was 143/23.   CONCLUSION:  1.  Acute  diaphragmatic wall infarction with 99% stenosis in the mid right      coronary artery, 40% ostial stenosis in the circumflex artery, 50%      stenosis in the proximal left anterior descending coronary artery, and      inferior wall hypokinesis with an estimated ejection fraction of 55%.  2.  Successful percutaneous coronary intervention of the lesion in the mid      right coronary artery using a Taxus drug-eluting stent with improvement      in percent of narrowing from 99% to 0% with TIMI 3 flow before      intervention and with an embolus to a distal right ventricular branch.   The patient had the onset of symptoms at 1100 and the first EKG was obtained  by EMS at 1158.  She arrived at [facility] emergency room at 1202 and Code  STEMI was called by Dr. Beverely Pace.  She arrived at [facility] catheterization  lab at 1227.  Reperfusion was established with the first balloon inflation  performed at 1253.  This gave a door-to-balloon time of 51 minutes and a  reperfusion time of 1 hour and 53 minutes.   DISPOSITION:  The patient was returned to the post angioplasty unit for  further observation.  Will plan to target discharge on day #2.  Because of a  stick at the bifurcation the right femoral was not closed.           ______________________________  Charlies Constable, M.D. [facility]     BB/MEDQ  D:  03/26/2006  T:  03/26/2006  Job:  [id number]

## 2011-01-26 ENCOUNTER — Encounter: Payer: Self-pay | Admitting: *Deleted

## 2011-01-26 ENCOUNTER — Other Ambulatory Visit (INDEPENDENT_AMBULATORY_CARE_PROVIDER_SITE_OTHER): Payer: Medicare Other

## 2011-01-26 ENCOUNTER — Ambulatory Visit (INDEPENDENT_AMBULATORY_CARE_PROVIDER_SITE_OTHER): Payer: Medicare Other | Admitting: Adult Health

## 2011-01-26 VITALS — BP 132/72 | HR 75 | Temp 97.9°F | Ht 61.0 in | Wt 142.6 lb

## 2011-01-26 DIAGNOSIS — R197 Diarrhea, unspecified: Secondary | ICD-10-CM | POA: Insufficient documentation

## 2011-01-26 LAB — HEPATIC FUNCTION PANEL
ALT: 40 U/L — ABNORMAL HIGH (ref 0–35)
AST: 25 U/L (ref 0–37)
Alkaline Phosphatase: 73 U/L (ref 39–117)
Bilirubin, Direct: 0.1 mg/dL (ref 0.0–0.3)
Total Bilirubin: 0.4 mg/dL (ref 0.3–1.2)

## 2011-01-26 LAB — CBC WITH DIFFERENTIAL/PLATELET
Basophils Absolute: 0.1 10*3/uL (ref 0.0–0.1)
Eosinophils Absolute: 0.2 10*3/uL (ref 0.0–0.7)
Lymphocytes Relative: 41.7 % (ref 12.0–46.0)
MCHC: 34.2 g/dL (ref 30.0–36.0)
MCV: 97 fl (ref 78.0–100.0)
Monocytes Absolute: 0.9 10*3/uL (ref 0.1–1.0)
Neutrophils Relative %: 46.6 % (ref 43.0–77.0)
Platelets: 337 10*3/uL (ref 150.0–400.0)
RBC: 4.69 Mil/uL (ref 3.87–5.11)
WBC: 10.4 10*3/uL (ref 4.5–10.5)

## 2011-01-26 LAB — BASIC METABOLIC PANEL
Chloride: 99 mEq/L (ref 96–112)
GFR: 52 mL/min — ABNORMAL LOW (ref >60.00)
Potassium: 4 mEq/L (ref 3.5–5.1)
Sodium: 138 mEq/L (ref 135–145)

## 2011-01-26 MED ORDER — CIPROFLOXACIN HCL 500 MG PO TABS: 500.0000 mg | ORAL_TABLET | Freq: Two times a day (BID) | ORAL | Status: AC

## 2011-01-26 NOTE — Progress Notes (Signed)
  Subjective:    Patient ID: [name] [name], female    DOB: [birth year], 70 y.o.   MRN: [id number]  HPI 70 yo WF with known hx of HTN, GERD, IBS   01/26/11 Acute OV  Pt present sfor an acute office. Complains of nausea, diarrhea, chills, no appetite x2weeks, getting progressively worse.  Began 2 weeks ago prior to [place]. She ate out a lot while gone which made it worse.  Complains of  Loose stools with eating . Has not improved and seems to be getting worse.  No vomitting or fever . Does feel weak with  chills and sweats. Has Cramps with stools , no  severe pain.  Has Lost 10 lbs. She has No urinary symptoms . Denies bloody stools or recent abx.  NO immodium has been used.   Review of Systems Constitutional:   No  weight loss, night sweats,  Fevers, chills, fatigue, or  lassitude.  HEENT:   No headaches,  Difficulty swallowing,  Tooth/dental problems, or  Sore throat,                No sneezing, itching, ear ache, nasal congestion, post nasal drip,   CV:  No chest pain,  Orthopnea, PND, swelling in lower extremities, anasarca, dizziness, palpitations, syncope.   GI  No heartburn, indigestion, abdominal pain,   Vomiting,  bloody stools.   Resp: No shortness of breath with exertion or at rest.  No excess mucus, no productive cough,  No non-productive cough,  No coughing up of blood.  No change in color of mucus.  No wheezing.  No chest wall deformity  Skin: no rash or lesions.  GU: no dysuria, change in color of urine, no urgency or frequency.  No flank pain, no hematuria   MS:  No joint pain or swelling.  No decreased range of motion.  No back pain.  Psych:  No change in mood or affect. No depression or anxiety.  No memory loss.         Objective:   Physical Exam GEN: A/Ox3; pleasant , NAD, well nourished   HEENT:  [place]/AT,  EACs-clear, TMs-wnl, NOSE-clear, THROAT-clear, no lesions, no postnasal drip or exudate noted.   NECK:  Supple w/ fair ROM; no  JVD; normal carotid impulses w/o bruits; no thyromegaly or nodules palpated; no lymphadenopathy.  RESP  Clear  P & A; w/o, wheezes/ rales/ or rhonchi.no accessory muscle use, no dullness to percussion  CARD:  RRR, no m/r/g  , no peripheral edema, pulses intact, no cyanosis or clubbing.  GI:   Soft & nt; nml bowel sounds; no organomegaly or masses detected., no guarding or rebound.   Musco: Warm bil, no deformities or joint swelling noted.   Neuro: alert, no focal deficits noted.    Skin: Warm, no lesions or rashes          Assessment & Plan:

## 2011-01-26 NOTE — Patient Instructions (Addendum)
Cipro 500mg  Twice daily  For 3 days May use Immodium AD As needed  Diarrhea  For 2 doses  Use Bentyl As needed  Abdominal cramps Use gasx with meals As needed   Advance clear liquid diet as tolerated.  Hold vitaimins until diarrhea is gone.  Hold Metformin x 4 days then restart I will call with labs.  Please contact office for sooner follow up if symptoms do not improve or worsen or seek emergency care

## 2011-01-30 NOTE — Assessment & Plan Note (Addendum)
Acute Diarrhea - ? Viral  Not improving   Plan : Cipro 500mg  Twice daily  For 3 days May use Immodium AD As needed  Diarrhea  For 2 doses  Use Bentyl As needed  Abdominal cramps Use gasx with meals As needed   Advance clear liquid diet as tolerated.  Hold vitaimins until diarrhea is gone.  Hold Metformin x 4 days then restart I will call with labs.  Please contact office for sooner follow up if symptoms do not improve or worsen or seek emergency care

## 2011-03-03 ENCOUNTER — Other Ambulatory Visit: Payer: Self-pay | Admitting: Pulmonary Disease

## 2011-03-24 ENCOUNTER — Other Ambulatory Visit: Payer: Self-pay | Admitting: Cardiovascular Disease

## 2011-03-30 ENCOUNTER — Ambulatory Visit
Admission: RE | Admit: 2011-03-30 | Discharge: 2011-03-30 | Disposition: A | Discharge status: Home / Self Care | Payer: Medicare Other | Source: Ambulatory Visit | Attending: Sports Medicine | Admitting: Sports Medicine

## 2011-03-30 ENCOUNTER — Other Ambulatory Visit: Payer: Self-pay | Admitting: Sports Medicine

## 2011-03-30 DIAGNOSIS — M79662 Pain in left lower leg: Secondary | ICD-10-CM

## 2011-04-10 ENCOUNTER — Other Ambulatory Visit (INDEPENDENT_AMBULATORY_CARE_PROVIDER_SITE_OTHER): Payer: Medicare Other

## 2011-04-10 ENCOUNTER — Ambulatory Visit (INDEPENDENT_AMBULATORY_CARE_PROVIDER_SITE_OTHER): Payer: Medicare Other | Admitting: Pulmonary Disease

## 2011-04-10 ENCOUNTER — Other Ambulatory Visit: Payer: Self-pay | Admitting: Pulmonary Disease

## 2011-04-10 ENCOUNTER — Encounter: Payer: Self-pay | Admitting: Pulmonary Disease

## 2011-04-10 DIAGNOSIS — I10 Essential (primary) hypertension: Secondary | ICD-10-CM

## 2011-04-10 DIAGNOSIS — R32 Unspecified urinary incontinence: Secondary | ICD-10-CM

## 2011-04-10 DIAGNOSIS — M542 Cervicalgia: Secondary | ICD-10-CM

## 2011-04-10 DIAGNOSIS — E119 Type 2 diabetes mellitus without complications: Secondary | ICD-10-CM

## 2011-04-10 DIAGNOSIS — I679 Cerebrovascular disease, unspecified: Secondary | ICD-10-CM

## 2011-04-10 DIAGNOSIS — M1990 Unspecified osteoarthritis, unspecified site: Secondary | ICD-10-CM

## 2011-04-10 DIAGNOSIS — K219 Gastro-esophageal reflux disease without esophagitis: Secondary | ICD-10-CM

## 2011-04-10 DIAGNOSIS — K5730 Diverticulosis of large intestine without perforation or abscess without bleeding: Secondary | ICD-10-CM

## 2011-04-10 DIAGNOSIS — E782 Mixed hyperlipidemia: Secondary | ICD-10-CM

## 2011-04-10 DIAGNOSIS — I2510 Atherosclerotic heart disease of native coronary artery without angina pectoris: Secondary | ICD-10-CM

## 2011-04-10 DIAGNOSIS — K589 Irritable bowel syndrome without diarrhea: Secondary | ICD-10-CM

## 2011-04-10 DIAGNOSIS — F411 Generalized anxiety disorder: Secondary | ICD-10-CM

## 2011-04-10 LAB — LIPID PANEL
Cholesterol: 174 mg/dL (ref 0–200)
HDL: 41.2 mg/dL (ref >39.00)
Total CHOL/HDL Ratio: 4
Triglycerides: 320 mg/dL — ABNORMAL HIGH (ref 0.0–149.0)
VLDL: 64 mg/dL — ABNORMAL HIGH (ref 0.0–40.0)

## 2011-04-10 LAB — BASIC METABOLIC PANEL
CO2: 32 mEq/L (ref 19–32)
Calcium: 8.7 mg/dL (ref 8.4–10.5)
Creatinine, Ser: 0.9 mg/dL (ref 0.4–1.2)
GFR: 62.31 mL/min (ref >60.00)
Sodium: 139 mEq/L (ref 135–145)

## 2011-04-10 LAB — LDL CHOLESTEROL, DIRECT: Direct LDL: 74.6 mg/dL

## 2011-04-10 LAB — HEMOGLOBIN A1C: Hgb A1c MFr Bld: 5.8 % (ref 4.6–6.5)

## 2011-04-10 NOTE — Progress Notes (Signed)
Subjective:    Patient ID: [name] [name], female    DOB: 07/17/1941, 70 y.o.   MRN: [id number]  HPI 70 y/o WF here for a follow up visit... he has multiple medical problems as noted below...      Followed by [name] [name] for Cards w/ atyp CP, nonobstructive CAD, HBP, PVCs, Cerebrovasc dis w/ old lacunar infarct, & hyperlipidemia >> see meds below...    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 they started Rx w/ phys therapy.    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  ~  April 20, 2010:  she's had bilat cat surg & she's seen Dr Maurina [name] (stable [name]), [name] [name] (started pelvic floor PT), and [name] [name] (given shot in knee- needs TKR) over the last 3months... she notes occas wheezing/ SOB, but she's been exerc on a treadmill...  BP stable on meds;  Chol is improved on Crestor but TGs elevated & may need Fibrate;  BS OK on Metformin but A1c not as good w/ weight gain;  we decided to check CXR (NAD), and PFTs (mild restriction)...  ~  October 09, 2010:  she is sched for left TKR by [name] [name] next month & needs medical clearance> already seen by [name] [name] 1/12 & given the OK from Cards standpoint... she has continued neck pain & HA issues> followed by [name] [name] but she notes that Arthrotec, Tramadol, Methocarbamol, Lyrica - all w/o help for her recent post neck/ head pain... we decided to change the Tramadol to Vicodin (lim 3 daily) & see if this works better... breathing is stable on Advair; BP controlled on current med rx;  denies CP, palpit, SOB, cerebral ischemic symptoms, etc; she will ret to lab for fasting blood work...  ~  April 10, 2011:  19mo ROV & she  states she's doing much better> she is s/p left TKR 3/12 by [name] [name]; recent cellulitis left leg evaluated by [name] [name] & treated w/ Keflex & resolved (VenDopplers were neg as well);  BP controlled w/ Metoprolol, Losartan, Lasix; she denies CP, palpit, SOB, & denies cerebral ischemic symptoms;  Chol looks good on Prav40 but TGs are worse & we discussed low fat diet & FishOil Rx;  BS much improved w/ Metformin & Glimep (A1c=5.8 & rec to decr glimep1mg  to 1/2 Qam)... She had a gastroenteritis 5/12 treated w/ cipro, fluids, supportive care & resolved...    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    She also saw Urology 6/12 for f/u of her chronic cystitis & incont> tried on Gelnique, but didn't stick w/ it...   Current Problem List:  Hx of BRONCHITIS (ICD-466.0) - hx AB w/ refractory episode 4/11 & we added ADVAIR 100Bid, MUCINEX OTC, & Hydromet cough syrup Prn... ~  8/11:  she still c/o some wheezing & wants cough syrup refilled... CXR= clear, NAD; and PFTs show mild restriction only. ~  CXR 3/12 showed sl incr markings, borderline heart size, cervical plate, NAD...  HYPERTENSION (ICD-401.9) - controlled on  METOPROLOL 100mg Bid, LOSARTAN 50mg /d, and LASIX 20mg /d... BP= 128/80 and feeling OK- without HA, visual  symptoms, CP, palpit, SOB, edema, etc...  CORONARY ARTERY DISEASE (ICD-414.00) - on above + ASA 81mg /d + PLAVIX 75mg /d...  followed by [name] [name] and his notes are reviewed. 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 she had PCI w/ drug eluting stent in RCA... ~  NuclearStressTest 12/08 showed sm area of ischemia inferolateral wall, norm LVF=70%... ~  2DEcho 12/08 showed no LV regional wall motion abn, EF=60%, some DD was seen... ~  saw [name] [name] 11/09- note reviewed... continue same meds, consider re-cath if CP occurs. ~   recurrent CP 12/10:  Myoview w/ sm inferoapical infarct, no ischemia, EF= 76%;  12/10 cath showed 20%LMain, 60-70%midLAD, 40-50%ostial CIRC, midRCA stent patent w/o restenosis, EF= 65%... stable, no change from 2007, med Rx.  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  CEREBROVASCULAR DISEASE (ICD-437.9) - CT Br 2/09 showed sm vessel disease and old lacunar infarct... on ASA + PLAVIX.  MIXED HYPERLIPIDEMIA (ICD-272.2) - on PRAVASTATIN 40mg /d + NIASPAN 500- 2Qhs + FISH OIL daily... ~  FLP 8/08 on Lipitor80 showed TChol 127, TG 201, HDL 34, LDL 61 ~  FLP 7/09 showed TChol 129, TG 265, HDL 36, LDL 65... Lipid Clinic may want to consider a fibrate... ~  FLP 9/09 howed TChol 152, TG 225, HDL 30, LDL 70 ~  FLP 1/10 showed TChol 137, TG 203, HDL 38, LDL 58 ~  FLP 9/10 showed TChol 154, TG 225, HDL 42, LDL 81 ~  FLP 8/11 showed TChol 118, TG 332, HDL 32, LDL 45... may need fibrate. ~  FLP 2/12 showed TChol 134, TG 257, HDL 33, LDL 58... needs better low fat diet! ~  FLP 8/12 showed TChol 174, TG 320, HDL 41, LDL 75... ditto  DIABETES MELLITUS, TYPE II (ICD-250.00) - on METFORMIN 500mg - 1/2 tabBid... ~  labs 8/08 showed BS= 162, HgA1c= 6.5.Marland KitchenMarland Kitchen ~  labs 2/09 showed BS= 154... ~  labs 9/09 (wt=152#) showed BS= 141, HgA1c= 6.1 ~  labs 9/10 (wt=126#) showed BS= 136, A1c= 6.1 ~  labs 8/11 showed BS= 137, A1c= 7.1 ~  labs 2/12 showed BS= 192, A1c= 7.5.Marland KitchenMarland Kitchen incr Metform500Bid, Add Glimep1mg /d. ~  Labs 8/12 showed BS= 112, A1c= 5.8.Marland KitchenMarland Kitchen rec decr Glimep1mg  to 1/2 tab Qam.  GASTROESOPHAGEAL REFLUX DISEASE (ICD-530.81) - last EGD 4/08 showed GERD and deformed pylorus... Rx w/ PPI... ~  she had a follow up w/ [name] [name] 7/09 for her GERD- on OMEP 20mg Bid, and IBS- on fiber & Bentyl... ~  5/12:  She had ?gastroenteritis & 10 lb wt loss, treated by [name] [name] w/ Cipro, Immodium, Bentyl==> improved.  DIVERTICULAR DISEASE (ICD-562.10) - last colonoscopy  11/04 by [name] [name] showed divertics only... IBS (ICD-564.1) - on BENTYL 10mg  Tid Prn...  URINARY INCONTINENCE (ICD-788.30) - eval 7/08 by [name] [name]- prev pubovag sling surg 2004... she has persist symptoms and he rec physical therapy... ~  she has had recurrent UTIs & Rx w/ Cipro... ~  6/12:  Had f/u Urology for her chr cystitis & incont, tried Gelnique, "no help" per pt & stopped.  DEGENERATIVE JOINT DISEASE (ICD-715.90)   < SEE ABOVE > ~  ER eval 12/10 for left knee pain- XRay w/ DJD, sm effusion- refer to Ortho, [name] [name] for shot (helped). ~  Office f/u 7/11 w/ another shot given- consider TKR. ~  3/12:  She had left TKR by [name] [name], followed by rehab etc & improved...  NECK PAIN, CHRONIC (ICD-723.1) -  s/p neck fusion surg x 2 in 2005 and 2006 by [name] [name] w/ a complex situation involving non-union, severe spondylosis, etc... notes from [name] [name] reviewed> on ARTHROTEC 75mg Bid, & METHOCARBAMOL 500mg Qid. ~  saw [name] [name] 9/10 & 5/11- stable, continue conservative management... ~  2/12:  OV c/o some incr neck discomfort & HA> XRays unchanged, rec VICODIN up to 3/d as needed.  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  DIZZINESS, CHRONIC (ICD-780.4) - full eval by DrSethi w/ CT/ MRI etc... Dx'd w/ benign positional vertigo and Rx'd w/ Eply manuever as needed.  ANXIETY (ICD-300.00) - on EFFEXOR 37.5mg /d for hot flashes & dizziness (she states this helps)... ~  Sep09:  she wants her "Factor V checked"... states that her sister & granddaughter see DrEnnever w/ clots and he thought it would be a good idea to check Sam[place]tha for Factor V Leiden Mutation (she has never had a venous thromboembolic phenomenon)... 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 LABS 9/09 neg for Factor V mutation.   Past Surgical History  Procedure Date  . Cholecystectomy   . Cervical spine surgery 07/2005    Dr  [name] [name]  . Bladder repair   . Total abdominal hysterectomy   . Tonsillectomy   . Thumb surgery   . Neck surgery   . Temporomandibular joint surgery   . Left total knee replacement 11/2010    Dr Eulah Pont    Outpatient Encounter Prescriptions as of 04/10/2011  Medication Sig Dispense Refill  . Aloe Vera 25 MG CAPS Take 1 capsule by mouth 2 (two) times daily.        Marland Kitchen aspirin 81 MG tablet Take 81 mg by mouth daily.        . calcium carbonate (OS-CAL) 600 MG TABS Take 600 mg by mouth 2 (two) times daily with a meal.        . Cholecalciferol (VITAMIN D3) 1000 UNITS CAPS Take 1 capsule by mouth daily.        . Cinnamon 500 MG capsule Take 500 mg by mouth daily.        . clopidogrel (PLAVIX) 75 MG tablet Take 75 mg by mouth daily.        Marland Kitchen Cod Liver Oil 1000 MG CAPS Take 1 capsule by mouth 2 (two) times daily.        . Coenzyme Q10 (CO Q 10) 100 MG CAPS Take 1 capsule by mouth daily.        . diclofenac (VOLTAREN) 75 MG EC tablet 1 tablet by mouth twice a day. Take with food  60 tablet  prn  . dicyclomine (BENTYL) 10 MG capsule Take 10 mg by mouth 2 (two) times daily with a meal.        . Flaxseed, Linseed, 1000 MG CAPS Take 3 capsules by mouth 2 (two) times daily.        . furosemide (LASIX) 20 MG tablet Take 20 mg by mouth daily.        Marland Kitchen gabapentin (NEURONTIN) 300 MG capsule Take 1 capsule (300 mg total) by mouth daily. At bedtime  30 capsule  11  . Garlic 1000 MG CAPS Take 2 capsules by mouth at bedtime.        . Glucosamine-Chondroit-Vit C-Mn (GLUCOSAMINE CHONDR 1500 COMPLX PO) Take 1 capsule by mouth 2 (two) times daily.        Marland Kitchen losartan (COZAAR) 100 MG tablet  Take 100 mg by mouth daily.        Marland Kitchen LYRICA 75 MG capsule TAKE ONE CAPSULE BY MOUTH TWICE DAILY AS NEEDED  60 each  4  . metFORMIN (GLUCOPHAGE) 500 MG tablet Take 500 mg by mouth 2 (two) times daily with a meal.        . methocarbamol (ROBAXIN) 500 MG tablet Take 500 mg by mouth 2 (two) times daily.        . metoprolol (LOPRESSOR) 100  MG tablet TAKE ONE TABLET BY MOUTH TWICE DAILY  60 tablet  5  . misoprostol (CYTOTEC) 200 MCG tablet Take 200 mcg by mouth 2 (two) times daily.        . Multiple Vitamin (STRESSTABS PO) Take 1 tablet by mouth at bedtime.        . niacin 500 MG tablet 2 tabs by mouth at bedtime       . nitroGLYCERIN (NITROSTAT) 0.4 MG SL tablet Place 0.4 mg under the tongue every 5 (five) minutes as needed. May repeat x 3       . Omega-3 Fatty Acids (FISH OIL) 1000 MG CAPS Take 1 capsule by mouth 4 (four) times daily.        Marland Kitchen omeprazole (PRILOSEC) 20 MG capsule Take 20 mg by mouth 2 (two) times daily.        . pravastatin (PRAVACHOL) 40 MG tablet Take 1 tablet (40 mg total) by mouth every evening.  30 tablet  11  . Red Yeast Rice 600 MG CAPS Take 1 capsule by mouth 2 (two) times daily.        Marland Kitchen venlafaxine (EFFEXOR-XR) 37.5 MG 24 hr capsule Take 37.5 mg by mouth daily.          Allergies  Allergen Reactions  . Codeine     REACTION: nausea  . Prednisone     REACTION: rash  . Sulfonamide Derivatives     REACTION: rash  . Tetracycline     REACTION: rash    Current Medications, Allergies, Past Medical History, Past Surgical History, Family History, and Social History were reviewed in [employer] record.    Review of Systems    See HPI - all other systems neg except as noted...  The patient complains of dyspnea on exertion, headaches, and depression.  The patient denies anorexia, fever, weight loss, weight gain, vision loss, decreased hearing, hoarseness, chest pain, syncope, peripheral edema, prolonged cough, hemoptysis, abdominal pain, melena, hematochezia, severe indigestion/heartburn, hematuria, incontinence, muscle weakness, suspicious skin lesions, transient blindness, difficulty walking, unusual weight change, abnormal bleeding, enlarged lymph nodes, and angioedema.     Objective:   Physical Exam    WD, WN, 70 y/o WF in NAD... GENERAL:  Alert & oriented; pleasant &  cooperative... HEENT:  [place]/AT, EOM-wnl, PERRLA, EACs-clear, TMs-wnl, NOSE-clear, THROAT-clear & wnl. NECK:  Supple w/ decrROM & scars from surg; no JVD; normal carotid impulses w/o bruits;  no thyromegaly or nodules palpated; no lymphadenopathy. CHEST:  Clear to P & A; without wheezes/ rales/ or rhonchi heard... HEART:  Regular Rhythm; without murmurs/ rubs/ or gallops detected... ABDOMEN:  Soft & nontender; normal bowel sounds; no organomegaly or masses palpated... EXT: s/p LTKR, mod arthritic changes; no varicose veins/ +venous insuffic/ tr edema. NEURO:  CN's intact; no focal neuro deficits... DERM:  No lesions noted; no rash etc...   Assessment & Plan:   HBP>  BP stable on Rx w/ Metoprolol, Losartan, & Lasix; continue same...  CAD>  On  ASA/ Plavix & followed by [name] [name]; stable w/o angina, palpit, SOB, edema...  Cerebrovasc dis>  Stable on meds and denies cerebral ischemic symptoms...  HYPERLIPIDEMIA>  TGs still elev & needs better low fat diet; continue same meds for now...  DM>  Improved control & w/ A1c down to 5.8 we will back off on the Glimep now down to 1/2 tab Qam...  GI> GERD, Divertics, IBS>  Stable on Omep & Bentyl...  GU> UTIs, chr cystitis, incont>  Followed & managed by [name] [name]...  DJD> s/p left TKR> managed by [name] [name] et al...  [name]>  S/p surgeries w/ fixation, plating, non-union, etc & followed & managed by [name] [name]...  Anxiety>  On Effexor which helps her hot flashes & dizziness she says.Marland KitchenMarland Kitchen

## 2011-04-10 NOTE — Patient Instructions (Signed)
Today we updated your med list in EPIC...    Continue your current meds the same...  Today we did your follow up fasting blood work...    Please call the [facility] in a few days for your results...    Dial [id number] & when prompted enter your patient number followed by the # symbol...    Your patient number is:   [id number]#  Keep up the good work w/ your diet & exerice program...  Call for any questions...  Let's plan a follow up visit in 6 months, sooner if needed for problems.Marland KitchenMarland Kitchen

## 2011-04-15 ENCOUNTER — Encounter: Payer: Self-pay | Admitting: Pulmonary Disease

## 2011-06-07 ENCOUNTER — Ambulatory Visit: Payer: Medicare Other | Admitting: Gastroenterology

## 2011-07-18 ENCOUNTER — Ambulatory Visit (INDEPENDENT_AMBULATORY_CARE_PROVIDER_SITE_OTHER): Payer: Medicare Other

## 2011-07-18 DIAGNOSIS — Z23 Encounter for immunization: Secondary | ICD-10-CM

## 2011-07-24 ENCOUNTER — Telehealth: Payer: Self-pay | Admitting: Pulmonary Disease

## 2011-07-24 NOTE — Telephone Encounter (Signed)
Leigh, have you seen request for these supplies? Please advise thanks

## 2011-07-25 NOTE — Telephone Encounter (Signed)
rx has been signed by SN and sent back up front to be faxed.

## 2011-08-24 ENCOUNTER — Other Ambulatory Visit: Payer: Self-pay | Admitting: Pulmonary Disease

## 2011-08-27 ENCOUNTER — Other Ambulatory Visit: Payer: Self-pay | Admitting: Allergy

## 2011-08-29 ENCOUNTER — Other Ambulatory Visit: Payer: Self-pay | Admitting: Pulmonary Disease

## 2011-09-04 HISTORY — PX: INCISION AND DRAINAGE ABSCESS / HEMATOMA OF BURSA / KNEE / THIGH: SUR668

## 2011-09-10 ENCOUNTER — Other Ambulatory Visit: Payer: Self-pay | Admitting: Pulmonary Disease

## 2011-09-11 ENCOUNTER — Encounter: Payer: Self-pay | Admitting: Cardiovascular Disease

## 2011-09-11 ENCOUNTER — Ambulatory Visit (INDEPENDENT_AMBULATORY_CARE_PROVIDER_SITE_OTHER): Payer: Medicare Other | Admitting: Cardiovascular Disease

## 2011-09-11 VITALS — BP 160/84 | HR 64 | Ht 61.0 in | Wt 147.8 lb

## 2011-09-11 DIAGNOSIS — E782 Mixed hyperlipidemia: Secondary | ICD-10-CM | POA: Diagnosis not present

## 2011-09-11 DIAGNOSIS — I2510 Atherosclerotic heart disease of native coronary artery without angina pectoris: Secondary | ICD-10-CM

## 2011-09-11 DIAGNOSIS — I10 Essential (primary) hypertension: Secondary | ICD-10-CM

## 2011-09-11 MED ORDER — NITROGLYCERIN 0.4 MG SL SUBL: 0.4000 mg | SUBLINGUAL_TABLET | SUBLINGUAL | Status: DC | PRN

## 2011-09-11 NOTE — Assessment & Plan Note (Signed)
The patient's blood pressure is elevated today. I reviewed all of her recent office visits with both me and Dr. Kriste Basque and she has been otherwise normotensive. Have asked her to continue her same medical regimen and monitor home blood pressures. She will call back if her blood pressure is running greater than 140/90 and further medicine adjustments can be made.

## 2011-09-11 NOTE — Patient Instructions (Signed)
Your physician wants you to follow-up in: 12 months.  You will receive a reminder letter in the mail two months in advance. If you don't receive a letter, please call our office to schedule the follow-up appointment.  Your physician recommends that you continue on your current medications as directed. Please refer to the Current Medication list given to you today.  Check blood pressure at home and keep record.  Call us if blood pressure above 140/90

## 2011-09-11 NOTE — Progress Notes (Signed)
HPI:  This is a 71 year old woman presenting for followup evaluation. The patient has coronary artery disease and initially presented with an inferior wall myocardial infarction in 2007. She was treated with primary PCI utilizing a drug-eluting stent in the right coronary artery. The patient has undergone both followup cardiac catheterization and Myoview stress testing to evaluate recurrent symptoms of shortness of breath and chest pain. Her Myoview scan showed no ischemia and her cardiac catheterization demonstrated moderate, stable stenosis in the LAD. Her RCA stent sites have been widely patent.  The patient's left ventricular function is normal.  Overall the patient is doing fairly well. She bent her knee surgery since I've seen her last and she had no major perioperative problems. She reports compliance with her complex medical regime. The patient has had a few episodes of chest pain and she has taken nitroglycerin once approximately 6 weeks ago. She has not had any typical symptoms of angina and she has had no symptoms with exertion. She denies leg swelling, palpitations, orthopnea, or PND. She denies chest pressure.  Outpatient Encounter Prescriptions as of 09/11/2011  Medication Sig Dispense Refill  . Aloe Vera 25 MG CAPS Take 1 capsule by mouth 2 (two) times daily.        . Ascorbic Acid (VITAMIN C) 1000 MG tablet Take 1,000 mg by mouth daily.        Marland Kitchen aspirin 81 MG tablet Take 81 mg by mouth daily.        . calcium carbonate (OS-CAL) 600 MG TABS Take 600 mg by mouth 2 (two) times daily with a meal.        . Cholecalciferol (VITAMIN D3) 1000 UNITS CAPS Take 1 capsule by mouth daily.        . Cinnamon 500 MG capsule Take 500 mg by mouth daily.        Marland Kitchen Cod Liver Oil 1000 MG CAPS Take 1 capsule by mouth 2 (two) times daily.        . Coenzyme Q10 (CO Q 10) 100 MG CAPS Take 1 capsule by mouth daily.        . diclofenac (VOLTAREN) 75 MG EC tablet 1 tablet by mouth twice a day. Take with food  60  tablet  prn  . dicyclomine (BENTYL) 10 MG capsule Take 10 mg by mouth 4 (four) times daily -  before meals and at bedtime.       . Flaxseed, Linseed, 1000 MG CAPS Take 3 capsules by mouth 2 (two) times daily.        . furosemide (LASIX) 20 MG tablet TAKE ONE TABLET BY MOUTH EVERY DAY  30 tablet  11  . Garlic 1000 MG CAPS Take 2 capsules by mouth at bedtime.        Marland Kitchen glimepiride (AMARYL) 1 MG tablet Take 1 mg by mouth daily before breakfast.        . Glucosamine-Chondroit-Vit C-Mn (GLUCOSAMINE CHONDR 1500 COMPLX PO) Take 1 capsule by mouth 2 (two) times daily.        Marland Kitchen losartan (COZAAR) 100 MG tablet TAKE ONE TABLET BY MOUTH EVERY DAY  30 tablet  5  . LYRICA 75 MG capsule TAKE ONE CAPSULE BY MOUTH TWICE DAILY AS NEEDED  60 each  4  . metFORMIN (GLUCOPHAGE) 500 MG tablet Take 500 mg by mouth 2 (two) times daily with a meal.        . metoprolol (LOPRESSOR) 100 MG tablet TAKE ONE TABLET BY MOUTH TWICE DAILY  60  tablet  5  . misoprostol (CYTOTEC) 200 MCG tablet Take 200 mcg by mouth 2 (two) times daily.        . Multiple Vitamin (STRESSTABS PO) Take 1 tablet by mouth at bedtime.        . niacin 500 MG tablet 2 tabs by mouth at bedtime       . nitroGLYCERIN (NITROSTAT) 0.4 MG SL tablet Place 0.4 mg under the tongue every 5 (five) minutes as needed. May repeat x 3       . Omega-3 Fatty Acids (FISH OIL) 1000 MG CAPS Take 1 capsule by mouth 4 (four) times daily.        Marland Kitchen omeprazole (PRILOSEC) 20 MG capsule Take 20 mg by mouth 2 (two) times daily.        . pravastatin (PRAVACHOL) 40 MG tablet Take 1 tablet (40 mg total) by mouth every evening.  30 tablet  11  . psyllium (REGULOID) 0.52 G capsule Take 0.52 g by mouth 2 (two) times daily.        . Red Yeast Rice 600 MG CAPS Take 1 capsule by mouth 2 (two) times daily.        Marland Kitchen venlafaxine (EFFEXOR) 37.5 MG tablet TAKE ONE TABLET BY MOUTH EVERY DAY  30 tablet  5  . DISCONTD: gabapentin (NEURONTIN) 300 MG capsule Take 1 capsule (300 mg total) by mouth daily.  At bedtime  30 capsule  11  . DISCONTD: methocarbamol (ROBAXIN) 500 MG tablet Take 500 mg by mouth 2 (two) times daily.        Marland Kitchen DISCONTD: PLAVIX 75 MG tablet TAKE ONE TABLET BY MOUTH EVERY DAY  30 each  5  . DISCONTD: venlafaxine (EFFEXOR-XR) 37.5 MG 24 hr capsule Take 37.5 mg by mouth daily.          Allergies  Allergen Reactions  . Codeine     REACTION: nausea  . Prednisone     REACTION: rash  . Sulfonamide Derivatives     REACTION: rash  . Tetracycline     REACTION: rash    Past Medical History  Diagnosis Date  . Acute bronchitis   . HTN (hypertension)   . CAD (coronary artery disease)   . Premature ventricular contractions   . Cerebrovascular disease, unspecified   . Mixed hyperlipidemia   . DM type 2 (diabetes mellitus, type 2)   . GERD (gastroesophageal reflux disease)   . Diverticular disease   . IBS (irritable bowel syndrome)   . Urinary incontinence   . DJD (degenerative joint disease)   . Chronic neck pain   . Chronic pain syndrome   . Dizziness   . Anxiety     ROS: Negative except as per HPI  BP 160/84  Pulse 64  Ht 5\' 1"  (1.549 m)  Wt 67.042 kg (147 lb 12.8 oz)  BMI 27.93 kg/m2  PHYSICAL EXAM: Pt is alert and oriented, NAD HEENT: normal Neck: JVP - normal, carotids 2+= without bruits Lungs: CTA bilaterally CV: RRR without murmur or gallop Abd: soft, NT, Positive BS, obese Ext: no C/C/E, distal pulses intact and equal Skin: warm/dry no rash  EKG:  Normal sinus rhythm 64 beats per minute, cannot rule out anterior infarct age undetermined, leftward axis, otherwise within normal limits.  ASSESSMENT AND PLAN:

## 2011-09-11 NOTE — Assessment & Plan Note (Signed)
Lipids are at goal is for the hypertriglyceridemia. She is on multidrug therapy with pravastatin, fish oil, and niacin. We have focused discussion on limiting starches and fatty foods. We also discussed the importance of regular exercise. She will continue on her current medications.

## 2011-09-11 NOTE — Assessment & Plan Note (Signed)
The patient is stable and has very infrequent anginal symptoms. She will continue her current medical program. We had a lengthy discussion about the importance of increasing exercise and making dietary changes.

## 2011-09-15 ENCOUNTER — Other Ambulatory Visit: Payer: Self-pay | Admitting: Pulmonary Disease

## 2011-09-24 DIAGNOSIS — M1710 Unilateral primary osteoarthritis, unspecified knee: Secondary | ICD-10-CM | POA: Diagnosis not present

## 2011-09-24 DIAGNOSIS — M25569 Pain in unspecified knee: Secondary | ICD-10-CM | POA: Diagnosis not present

## 2011-09-25 ENCOUNTER — Telehealth: Payer: Self-pay | Admitting: Cardiovascular Disease

## 2011-09-25 ENCOUNTER — Other Ambulatory Visit: Payer: Self-pay

## 2011-09-25 ENCOUNTER — Encounter (HOSPITAL_COMMUNITY): Payer: Self-pay | Admitting: Orthopedic Surgery

## 2011-09-25 ENCOUNTER — Encounter (HOSPITAL_COMMUNITY)
Admission: AD | Disposition: A | Discharge status: Home Health | Payer: Self-pay | Source: Ambulatory Visit | Attending: Orthopedic Surgery

## 2011-09-25 ENCOUNTER — Encounter (HOSPITAL_COMMUNITY): Payer: Self-pay | Admitting: Certified Registered"

## 2011-09-25 ENCOUNTER — Encounter (HOSPITAL_COMMUNITY): Payer: Self-pay | Admitting: *Deleted

## 2011-09-25 ENCOUNTER — Inpatient Hospital Stay (HOSPITAL_COMMUNITY)
Admission: AD | Admit: 2011-09-25 | Discharge: 2011-09-28 | DRG: 486 | Disposition: A | Discharge status: Home Health | Payer: Medicare Other | Source: Ambulatory Visit | Attending: Orthopedic Surgery | Admitting: Orthopedic Surgery

## 2011-09-25 ENCOUNTER — Telehealth: Payer: Self-pay | Admitting: Pulmonary Disease

## 2011-09-25 ENCOUNTER — Inpatient Hospital Stay (HOSPITAL_COMMUNITY): Payer: Medicare Other | Admitting: Certified Registered"

## 2011-09-25 DIAGNOSIS — E785 Hyperlipidemia, unspecified: Secondary | ICD-10-CM | POA: Diagnosis present

## 2011-09-25 DIAGNOSIS — Z96659 Presence of unspecified artificial knee joint: Secondary | ICD-10-CM

## 2011-09-25 DIAGNOSIS — Z8673 Personal history of transient ischemic attack (TIA), and cerebral infarction without residual deficits: Secondary | ICD-10-CM | POA: Diagnosis not present

## 2011-09-25 DIAGNOSIS — K219 Gastro-esophageal reflux disease without esophagitis: Secondary | ICD-10-CM | POA: Diagnosis present

## 2011-09-25 DIAGNOSIS — N390 Urinary tract infection, site not specified: Secondary | ICD-10-CM | POA: Diagnosis present

## 2011-09-25 DIAGNOSIS — Y831 Surgical operation with implant of artificial internal device as the cause of abnormal reaction of the patient, or of later complication, without mention of misadventure at the time of the procedure: Secondary | ICD-10-CM | POA: Diagnosis present

## 2011-09-25 DIAGNOSIS — M25569 Pain in unspecified knee: Secondary | ICD-10-CM | POA: Diagnosis not present

## 2011-09-25 DIAGNOSIS — Z79899 Other long term (current) drug therapy: Secondary | ICD-10-CM | POA: Diagnosis not present

## 2011-09-25 DIAGNOSIS — E119 Type 2 diabetes mellitus without complications: Secondary | ICD-10-CM | POA: Diagnosis present

## 2011-09-25 DIAGNOSIS — T8450XA Infection and inflammatory reaction due to unspecified internal joint prosthesis, initial encounter: Principal | ICD-10-CM | POA: Diagnosis present

## 2011-09-25 DIAGNOSIS — F411 Generalized anxiety disorder: Secondary | ICD-10-CM | POA: Diagnosis present

## 2011-09-25 DIAGNOSIS — M47812 Spondylosis without myelopathy or radiculopathy, cervical region: Secondary | ICD-10-CM | POA: Diagnosis present

## 2011-09-25 DIAGNOSIS — Z7902 Long term (current) use of antithrombotics/antiplatelets: Secondary | ICD-10-CM | POA: Diagnosis not present

## 2011-09-25 DIAGNOSIS — Y849 Medical procedure, unspecified as the cause of abnormal reaction of the patient, or of later complication, without mention of misadventure at the time of the procedure: Secondary | ICD-10-CM | POA: Diagnosis not present

## 2011-09-25 DIAGNOSIS — B961 Klebsiella pneumoniae [K. pneumoniae] as the cause of diseases classified elsewhere: Secondary | ICD-10-CM | POA: Diagnosis present

## 2011-09-25 DIAGNOSIS — I10 Essential (primary) hypertension: Secondary | ICD-10-CM | POA: Diagnosis present

## 2011-09-25 DIAGNOSIS — T847XXA Infection and inflammatory reaction due to other internal orthopedic prosthetic devices, implants and grafts, initial encounter: Secondary | ICD-10-CM | POA: Diagnosis not present

## 2011-09-25 DIAGNOSIS — I2510 Atherosclerotic heart disease of native coronary artery without angina pectoris: Secondary | ICD-10-CM | POA: Diagnosis not present

## 2011-09-25 DIAGNOSIS — T8459XA Infection and inflammatory reaction due to other internal joint prosthesis, initial encounter: Secondary | ICD-10-CM

## 2011-09-25 DIAGNOSIS — IMO0001 Reserved for inherently not codable concepts without codable children: Secondary | ICD-10-CM | POA: Diagnosis not present

## 2011-09-25 DIAGNOSIS — A1802 Tuberculous arthritis of other joints: Secondary | ICD-10-CM | POA: Diagnosis not present

## 2011-09-25 HISTORY — PX: I & D KNEE WITH POLY EXCHANGE: SHX5024

## 2011-09-25 HISTORY — PX: I&D KNEE WITH POLY EXCHANGE: SHX5024

## 2011-09-25 HISTORY — DX: Infection and inflammatory reaction due to other internal joint prosthesis, initial encounter: T84.59XA

## 2011-09-25 HISTORY — DX: Presence of unspecified artificial knee joint: Z96.659

## 2011-09-25 LAB — COMPREHENSIVE METABOLIC PANEL
ALT: 30 U/L (ref 0–35)
AST: 16 U/L (ref 0–37)
Albumin: 3.2 g/dL — ABNORMAL LOW (ref 3.5–5.2)
CO2: 28 mEq/L (ref 19–32)
Chloride: 96 mEq/L (ref 96–112)
Creatinine, Ser: 1.32 mg/dL — ABNORMAL HIGH (ref 0.50–1.10)
GFR calc non Af Amer: 40 mL/min — ABNORMAL LOW (ref >90)
Sodium: 133 mEq/L — ABNORMAL LOW (ref 135–145)
Total Bilirubin: 0.4 mg/dL (ref 0.3–1.2)

## 2011-09-25 LAB — URINE CULTURE

## 2011-09-25 LAB — GLUCOSE, CAPILLARY
Glucose-Capillary: 98 mg/dL (ref 70–99)
Glucose-Capillary: 96 mg/dL (ref 70–99)

## 2011-09-25 LAB — URINALYSIS, ROUTINE W REFLEX MICROSCOPIC
Hgb urine dipstick: NEGATIVE
Nitrite: NEGATIVE
Specific Gravity, Urine: 1.015 (ref 1.005–1.030)
Urobilinogen, UA: 0.2 mg/dL (ref 0.0–1.0)
pH: 5 (ref 5.0–8.0)

## 2011-09-25 LAB — SEDIMENTATION RATE: Sed Rate: 10 mm/hr (ref 0–22)

## 2011-09-25 LAB — URINE MICROSCOPIC-ADD ON

## 2011-09-25 LAB — DIFFERENTIAL
Basophils Absolute: 0 10*3/uL (ref 0.0–0.1)
Basophils Relative: 0 % (ref 0–1)
Lymphocytes Relative: 14 % (ref 12–46)
Monocytes Absolute: 1.4 10*3/uL — ABNORMAL HIGH (ref 0.1–1.0)
Neutro Abs: 13.9 10*3/uL — ABNORMAL HIGH (ref 1.7–7.7)
Neutrophils Relative %: 78 % — ABNORMAL HIGH (ref 43–77)

## 2011-09-25 LAB — CBC
MCHC: 34.4 g/dL (ref 30.0–36.0)
Platelets: 231 10*3/uL (ref 150–400)
RDW: 13.5 % (ref 11.5–15.5)
WBC: 18 10*3/uL — ABNORMAL HIGH (ref 4.0–10.5)

## 2011-09-25 LAB — PROTIME-INR: INR: 1.17 (ref 0.00–1.49)

## 2011-09-25 LAB — APTT: aPTT: 36 seconds (ref 24–37)

## 2011-09-25 SURGERY — IRRIGATION AND DEBRIDEMENT KNEE WITH POLY EXCHANGE
Anesthesia: General | Laterality: Left | Wound class: Contaminated

## 2011-09-25 MED ORDER — PROPOFOL 10 MG/ML IV EMUL
INTRAVENOUS | Status: DC | PRN
  Administered 2011-09-25: 150 mg via INTRAVENOUS

## 2011-09-25 MED ORDER — LACTATED RINGERS IV SOLN
INTRAVENOUS | Status: DC
  Administered 2011-09-25: 14:00:00 via INTRAVENOUS

## 2011-09-25 MED ORDER — ALUM & MAG HYDROXIDE-SIMETH 200-200-20 MG/5ML PO SUSP: 30.0000 mL | ORAL | Status: DC | PRN

## 2011-09-25 MED ORDER — METFORMIN HCL 500 MG PO TABS
500.0000 mg | ORAL_TABLET | Freq: Two times a day (BID) | ORAL | Status: DC
  Administered 2011-09-25 – 2011-09-28 (×6): 500 mg via ORAL
  Filled 2011-09-25 (×8): qty 1

## 2011-09-25 MED ORDER — PREGABALIN 75 MG PO CAPS
75.0000 mg | ORAL_CAPSULE | Freq: Two times a day (BID) | ORAL | Status: DC
  Administered 2011-09-25 – 2011-09-28 (×6): 75 mg via ORAL
  Filled 2011-09-25 (×6): qty 1

## 2011-09-25 MED ORDER — LACTATED RINGERS IV SOLN
INTRAVENOUS | Status: DC | PRN
  Administered 2011-09-25: 14:00:00 via INTRAVENOUS

## 2011-09-25 MED ORDER — ONDANSETRON HCL 4 MG/2ML IJ SOLN: 4.0000 mg | Freq: Four times a day (QID) | INTRAMUSCULAR | Status: DC | PRN

## 2011-09-25 MED ORDER — MORPHINE SULFATE 2 MG/ML IJ SOLN: 0.0500 mg/kg | INTRAMUSCULAR | Status: DC | PRN

## 2011-09-25 MED ORDER — FUROSEMIDE 20 MG PO TABS: 20.0000 mg | ORAL_TABLET | Freq: Every day | ORAL | Status: DC

## 2011-09-25 MED ORDER — CEFAZOLIN SODIUM-DEXTROSE 2-3 GM-% IV SOLR
2.0000 g | INTRAVENOUS | Status: AC
  Administered 2011-09-25: 2 g via INTRAVENOUS
  Filled 2011-09-25: qty 50

## 2011-09-25 MED ORDER — SIMVASTATIN 20 MG PO TABS: 20.0000 mg | ORAL_TABLET | Freq: Every day | ORAL | Status: DC

## 2011-09-25 MED ORDER — MISOPROSTOL 200 MCG PO TABS
200.0000 ug | ORAL_TABLET | Freq: Two times a day (BID) | ORAL | Status: DC
  Administered 2011-09-25 – 2011-09-28 (×6): 200 ug via ORAL
  Filled 2011-09-25 (×7): qty 1

## 2011-09-25 MED ORDER — ONDANSETRON HCL 4 MG PO TABS: 4.0000 mg | ORAL_TABLET | Freq: Four times a day (QID) | ORAL | Status: DC | PRN

## 2011-09-25 MED ORDER — ZOLPIDEM TARTRATE 5 MG PO TABS: 5.0000 mg | ORAL_TABLET | Freq: Every evening | ORAL | Status: DC | PRN

## 2011-09-25 MED ORDER — VENLAFAXINE HCL 37.5 MG PO TABS
37.5000 mg | ORAL_TABLET | Freq: Every day | ORAL | Status: DC
  Administered 2011-09-25 – 2011-09-28 (×4): 37.5 mg via ORAL
  Filled 2011-09-25 (×4): qty 1

## 2011-09-25 MED ORDER — METHOCARBAMOL 500 MG PO TABS
500.0000 mg | ORAL_TABLET | Freq: Four times a day (QID) | ORAL | Status: DC | PRN
  Administered 2011-09-25 – 2011-09-27 (×3): 500 mg via ORAL
  Filled 2011-09-25 (×3): qty 1

## 2011-09-25 MED ORDER — WARFARIN SODIUM 5 MG PO TABS
5.0000 mg | ORAL_TABLET | Freq: Once | ORAL | Status: AC
  Administered 2011-09-25: 5 mg via ORAL
  Filled 2011-09-25: qty 1

## 2011-09-25 MED ORDER — VANCOMYCIN HCL IN DEXTROSE 1-5 GM/200ML-% IV SOLN
1000.0000 mg | INTRAVENOUS | Status: DC
  Administered 2011-09-25: 1000 mg via INTRAVENOUS
  Filled 2011-09-25 (×2): qty 200

## 2011-09-25 MED ORDER — ONDANSETRON HCL 4 MG/2ML IJ SOLN
INTRAMUSCULAR | Status: DC | PRN
  Administered 2011-09-25: 4 mg via INTRAVENOUS

## 2011-09-25 MED ORDER — PANTOPRAZOLE SODIUM 40 MG PO TBEC
40.0000 mg | DELAYED_RELEASE_TABLET | Freq: Every day | ORAL | Status: DC
  Administered 2011-09-25 – 2011-09-27 (×3): 40 mg via ORAL
  Filled 2011-09-25 (×3): qty 1

## 2011-09-25 MED ORDER — ENOXAPARIN SODIUM 30 MG/0.3ML ~~LOC~~ SOLN
30.0000 mg | Freq: Two times a day (BID) | SUBCUTANEOUS | Status: DC
  Administered 2011-09-25 – 2011-09-28 (×6): 30 mg via SUBCUTANEOUS
  Filled 2011-09-25 (×7): qty 0.3

## 2011-09-25 MED ORDER — VITAMIN C 500 MG PO TABS
1000.0000 mg | ORAL_TABLET | Freq: Every day | ORAL | Status: DC
  Administered 2011-09-25 – 2011-09-28 (×4): 1000 mg via ORAL
  Filled 2011-09-25 (×4): qty 2

## 2011-09-25 MED ORDER — NITROGLYCERIN 0.4 MG SL SUBL: 0.4000 mg | SUBLINGUAL_TABLET | SUBLINGUAL | Status: DC | PRN

## 2011-09-25 MED ORDER — WARFARIN VIDEO: Freq: Once | Status: DC

## 2011-09-25 MED ORDER — PREGABALIN 25 MG PO CAPS: 25.0000 mg | ORAL_CAPSULE | Freq: Every day | ORAL | Status: DC

## 2011-09-25 MED ORDER — ASPIRIN 81 MG PO TABS: 81.0000 mg | ORAL_TABLET | Freq: Every day | ORAL | Status: DC

## 2011-09-25 MED ORDER — CALCIUM CARBONATE 600 MG PO TABS
600.0000 mg | ORAL_TABLET | Freq: Two times a day (BID) | ORAL | Status: DC
  Filled 2011-09-25: qty 1

## 2011-09-25 MED ORDER — ASPIRIN EC 81 MG PO TBEC
81.0000 mg | DELAYED_RELEASE_TABLET | Freq: Every day | ORAL | Status: DC
  Administered 2011-09-25 – 2011-09-28 (×4): 81 mg via ORAL
  Filled 2011-09-25 (×4): qty 1

## 2011-09-25 MED ORDER — HYDROMORPHONE HCL PF 1 MG/ML IJ SOLN
0.2500 mg | INTRAMUSCULAR | Status: DC | PRN
  Administered 2011-09-25: 0.5 mg via INTRAVENOUS
  Filled 2011-09-25: qty 1

## 2011-09-25 MED ORDER — METOPROLOL TARTRATE 1 MG/ML IV SOLN
INTRAVENOUS | Status: DC | PRN
  Administered 2011-09-25: 12.5 mg via INTRAVENOUS

## 2011-09-25 MED ORDER — DIPHENHYDRAMINE HCL 12.5 MG/5ML PO ELIX
12.5000 mg | ORAL_SOLUTION | ORAL | Status: DC | PRN
  Filled 2011-09-25: qty 10

## 2011-09-25 MED ORDER — PATIENT'S GUIDE TO USING COUMADIN BOOK
Freq: Once | Status: DC
  Filled 2011-09-25: qty 1

## 2011-09-25 MED ORDER — MUPIROCIN 2 % EX OINT
TOPICAL_OINTMENT | Freq: Once | CUTANEOUS | Status: AC
  Administered 2011-09-25: 23:00:00 via NASAL
  Filled 2011-09-25: qty 22

## 2011-09-25 MED ORDER — METHOCARBAMOL 100 MG/ML IJ SOLN
500.0000 mg | Freq: Four times a day (QID) | INTRAVENOUS | Status: DC | PRN
  Filled 2011-09-25: qty 5

## 2011-09-25 MED ORDER — PIPERACILLIN-TAZOBACTAM 3.375 G IVPB
3.3750 g | Freq: Three times a day (TID) | INTRAVENOUS | Status: DC
  Administered 2011-09-25 – 2011-09-27 (×6): 3.375 g via INTRAVENOUS
  Filled 2011-09-25 (×9): qty 50

## 2011-09-25 MED ORDER — TRIMETHOPRIM 100 MG PO TABS
100.0000 mg | ORAL_TABLET | Freq: Every day | ORAL | Status: DC
  Administered 2011-09-25 – 2011-09-27 (×3): 100 mg via ORAL
  Filled 2011-09-25 (×4): qty 1

## 2011-09-25 MED ORDER — METHOCARBAMOL 100 MG/ML IJ SOLN
500.0000 mg | INTRAVENOUS | Status: AC
  Filled 2011-09-25: qty 5

## 2011-09-25 MED ORDER — PSYLLIUM 0.52 G PO CAPS: 0.5200 g | ORAL_CAPSULE | Freq: Two times a day (BID) | ORAL | Status: DC

## 2011-09-25 MED ORDER — MIDAZOLAM HCL 5 MG/5ML IJ SOLN
INTRAMUSCULAR | Status: DC | PRN
  Administered 2011-09-25: 2 mg via INTRAVENOUS
  Administered 2011-09-25: 1 mg via INTRAVENOUS

## 2011-09-25 MED ORDER — HYDROMORPHONE HCL PF 1 MG/ML IJ SOLN
0.5000 mg | INTRAMUSCULAR | Status: DC | PRN
  Administered 2011-09-25: 0.5 mg via INTRAVENOUS

## 2011-09-25 MED ORDER — METOCLOPRAMIDE HCL 5 MG/ML IJ SOLN
5.0000 mg | Freq: Three times a day (TID) | INTRAMUSCULAR | Status: DC | PRN
  Filled 2011-09-25: qty 2

## 2011-09-25 MED ORDER — DICYCLOMINE HCL 10 MG PO CAPS
10.0000 mg | ORAL_CAPSULE | Freq: Three times a day (TID) | ORAL | Status: DC
  Administered 2011-09-25 – 2011-09-28 (×10): 10 mg via ORAL
  Filled 2011-09-25 (×15): qty 1

## 2011-09-25 MED ORDER — ONDANSETRON HCL 4 MG/2ML IJ SOLN: 4.0000 mg | Freq: Once | INTRAMUSCULAR | Status: DC | PRN

## 2011-09-25 MED ORDER — SODIUM CHLORIDE 0.9 % IR SOLN
Status: DC | PRN
  Administered 2011-09-25: 9000 mL

## 2011-09-25 MED ORDER — VENLAFAXINE HCL 25 MG PO TABS: 25.0000 mg | ORAL_TABLET | Freq: Two times a day (BID) | ORAL | Status: DC

## 2011-09-25 MED ORDER — LOSARTAN POTASSIUM 50 MG PO TABS
100.0000 mg | ORAL_TABLET | Freq: Every day | ORAL | Status: DC
  Administered 2011-09-26 (×2): 100 mg via ORAL
  Filled 2011-09-25 (×3): qty 2

## 2011-09-25 MED ORDER — MENTHOL 3 MG MT LOZG: 1.0000 | LOZENGE | OROMUCOSAL | Status: DC | PRN

## 2011-09-25 MED ORDER — POTASSIUM CHLORIDE IN NACL 20-0.45 MEQ/L-% IV SOLN
INTRAVENOUS | Status: DC
  Administered 2011-09-25: 22:00:00 via INTRAVENOUS
  Filled 2011-09-25 (×3): qty 1000

## 2011-09-25 MED ORDER — SIMVASTATIN 20 MG PO TABS
20.0000 mg | ORAL_TABLET | Freq: Every day | ORAL | Status: DC
  Administered 2011-09-26 – 2011-09-27 (×2): 20 mg via ORAL
  Filled 2011-09-25 (×3): qty 1

## 2011-09-25 MED ORDER — METOCLOPRAMIDE HCL 10 MG PO TABS: 5.0000 mg | ORAL_TABLET | Freq: Three times a day (TID) | ORAL | Status: DC | PRN

## 2011-09-25 MED ORDER — FENTANYL CITRATE 0.05 MG/ML IJ SOLN: 50.0000 ug | INTRAMUSCULAR | Status: DC | PRN

## 2011-09-25 MED ORDER — LOSARTAN POTASSIUM 25 MG PO TABS: 25.0000 mg | ORAL_TABLET | Freq: Every day | ORAL | Status: DC

## 2011-09-25 MED ORDER — METOPROLOL TARTRATE 100 MG PO TABS
100.0000 mg | ORAL_TABLET | Freq: Once | ORAL | Status: DC
  Filled 2011-09-25: qty 1

## 2011-09-25 MED ORDER — MEPERIDINE HCL 25 MG/ML IJ SOLN: 6.2500 mg | INTRAMUSCULAR | Status: DC | PRN

## 2011-09-25 MED ORDER — FENTANYL CITRATE 0.05 MG/ML IJ SOLN
INTRAMUSCULAR | Status: DC | PRN
  Administered 2011-09-25: 100 ug via INTRAVENOUS

## 2011-09-25 MED ORDER — ACETAMINOPHEN 650 MG RE SUPP: 650.0000 mg | Freq: Four times a day (QID) | RECTAL | Status: DC | PRN

## 2011-09-25 MED ORDER — CALCIUM CARBONATE-VITAMIN D 500-200 MG-UNIT PO TABS
1.0000 | ORAL_TABLET | Freq: Two times a day (BID) | ORAL | Status: DC
  Administered 2011-09-26 – 2011-09-28 (×5): 1 via ORAL
  Filled 2011-09-25 (×7): qty 1

## 2011-09-25 MED ORDER — OXYCODONE-ACETAMINOPHEN 5-325 MG PO TABS
1.0000 | ORAL_TABLET | ORAL | Status: DC | PRN
  Administered 2011-09-25: 1 via ORAL
  Administered 2011-09-28: 2 via ORAL
  Filled 2011-09-25: qty 1
  Filled 2011-09-25: qty 2

## 2011-09-25 MED ORDER — METOPROLOL TARTRATE 25 MG PO TABS: 25.0000 mg | ORAL_TABLET | Freq: Two times a day (BID) | ORAL | Status: DC

## 2011-09-25 MED ORDER — FUROSEMIDE 20 MG PO TABS
20.0000 mg | ORAL_TABLET | Freq: Every day | ORAL | Status: DC
  Administered 2011-09-25 – 2011-09-28 (×4): 20 mg via ORAL
  Filled 2011-09-25 (×4): qty 1

## 2011-09-25 MED ORDER — PANTOPRAZOLE SODIUM 40 MG PO TBEC: 40.0000 mg | DELAYED_RELEASE_TABLET | Freq: Every day | ORAL | Status: DC

## 2011-09-25 MED ORDER — OXYCODONE HCL 5 MG PO TABS
5.0000 mg | ORAL_TABLET | ORAL | Status: DC | PRN
Start: 2011-09-25 — End: 2011-09-28
  Administered 2011-09-26 – 2011-09-28 (×6): 10 mg via ORAL
  Filled 2011-09-25 (×6): qty 2

## 2011-09-25 MED ORDER — METOPROLOL TARTRATE 100 MG PO TABS
100.0000 mg | ORAL_TABLET | Freq: Two times a day (BID) | ORAL | Status: DC
  Administered 2011-09-25 – 2011-09-26 (×2): 100 mg via ORAL
  Filled 2011-09-25 (×7): qty 1

## 2011-09-25 MED ORDER — CLOPIDOGREL BISULFATE 75 MG PO TABS
75.0000 mg | ORAL_TABLET | Freq: Every day | ORAL | Status: DC
  Administered 2011-09-26 – 2011-09-28 (×3): 75 mg via ORAL
  Filled 2011-09-25 (×4): qty 1

## 2011-09-25 MED ORDER — ACETAMINOPHEN 325 MG PO TABS
650.0000 mg | ORAL_TABLET | Freq: Four times a day (QID) | ORAL | Status: DC | PRN
  Administered 2011-09-27: 650 mg via ORAL
  Filled 2011-09-25 (×2): qty 2

## 2011-09-25 MED ORDER — PSYLLIUM 95 % PO PACK
1.0000 | PACK | Freq: Two times a day (BID) | ORAL | Status: DC
  Administered 2011-09-25 – 2011-09-28 (×5): 1 via ORAL
  Filled 2011-09-25 (×8): qty 1

## 2011-09-25 MED ORDER — GLIMEPIRIDE 1 MG PO TABS
1.0000 mg | ORAL_TABLET | Freq: Every day | ORAL | Status: DC
  Administered 2011-09-26 – 2011-09-28 (×3): 1 mg via ORAL
  Filled 2011-09-25 (×4): qty 1

## 2011-09-25 MED ORDER — MIDAZOLAM HCL 2 MG/2ML IJ SOLN: 1.0000 mg | INTRAMUSCULAR | Status: DC | PRN

## 2011-09-25 MED ORDER — NIACIN 500 MG PO TABS
1000.0000 mg | ORAL_TABLET | Freq: Every day | ORAL | Status: DC
  Administered 2011-09-25 – 2011-09-26 (×2): 1000 mg via ORAL
  Filled 2011-09-25 (×3): qty 2

## 2011-09-25 MED ORDER — PHENOL 1.4 % MT LIQD
1.0000 | OROMUCOSAL | Status: DC | PRN
  Filled 2011-09-25: qty 177

## 2011-09-25 SURGICAL SUPPLY — 71 items
BANDAGE ELASTIC 6 VELCRO ST LF (GAUZE/BANDAGES/DRESSINGS) ×2 IMPLANT
BANDAGE ESMARK 6X9 LF (GAUZE/BANDAGES/DRESSINGS) ×1 IMPLANT
BENZOIN TINCTURE PRP APPL 2/3 (GAUZE/BANDAGES/DRESSINGS) IMPLANT
BLADE SAG 18X100X1.27 (BLADE) IMPLANT
BLADE SAW RECIP 87.9 MT (BLADE) IMPLANT
BLADE SAW SGTL 13X75X1.27 (BLADE) IMPLANT
BLADE SURG 10 STRL SS (BLADE) ×2 IMPLANT
BNDG ESMARK 6X9 LF (GAUZE/BANDAGES/DRESSINGS) ×2
BOOTCOVER CLEANROOM LRG (PROTECTIVE WEAR) ×2 IMPLANT
BOWL SMART MIX CTS (DISPOSABLE) IMPLANT
CLOSURE STERI STRIP 1/2 X4 (GAUZE/BANDAGES/DRESSINGS) ×2 IMPLANT
CLOTH BEACON ORANGE TIMEOUT ST (SAFETY) ×2 IMPLANT
CONT SPEC 4OZ CLIKSEAL STRL BL (MISCELLANEOUS) ×2 IMPLANT
COVER SURGICAL LIGHT HANDLE (MISCELLANEOUS) ×4 IMPLANT
CUFF TOURNIQUET SINGLE 34IN LL (TOURNIQUET CUFF) ×2 IMPLANT
DRAPE EXTREMITY T 121X128X90 (DRAPE) ×2 IMPLANT
DRAPE PROXIMA HALF (DRAPES) ×2 IMPLANT
DRAPE U-SHAPE 47X51 STRL (DRAPES) ×2 IMPLANT
DRSG PAD ABDOMINAL 8X10 ST (GAUZE/BANDAGES/DRESSINGS) IMPLANT
DURAPREP 26ML APPLICATOR (WOUND CARE) ×2 IMPLANT
ELECT CAUTERY BLADE 6.4 (BLADE) IMPLANT
ELECT REM PT RETURN 9FT ADLT (ELECTROSURGICAL) ×2
ELECTRODE REM PT RTRN 9FT ADLT (ELECTROSURGICAL) ×1 IMPLANT
EVACUATOR 1/8 PVC DRAIN (DRAIN) ×2 IMPLANT
FACESHIELD LNG OPTICON STERILE (SAFETY) IMPLANT
FACESHIELD OPICON STD (MASK) ×4 IMPLANT
GLOVE BIOGEL PI IND STRL 7.0 (GLOVE) ×2 IMPLANT
GLOVE BIOGEL PI IND STRL 8 (GLOVE) ×3 IMPLANT
GLOVE BIOGEL PI INDICATOR 7.0 (GLOVE) ×2
GLOVE BIOGEL PI INDICATOR 8 (GLOVE) ×3
GLOVE ECLIPSE 6.5 STRL STRAW (GLOVE) ×4 IMPLANT
GLOVE EXAM NITRILE XS STR PU (GLOVE) ×2 IMPLANT
GLOVE ORTHO TXT STRL SZ7.5 (GLOVE) ×2 IMPLANT
GLOVE SURG ORTHO 8.0 STRL STRW (GLOVE) ×4 IMPLANT
GLOVE SURG SS PI 6.5 STRL IVOR (GLOVE) ×4 IMPLANT
GOWN PREVENTION PLUS XLARGE (GOWN DISPOSABLE) ×4 IMPLANT
GOWN STRL REIN XL XLG (GOWN DISPOSABLE) ×2 IMPLANT
HANDPIECE INTERPULSE COAX TIP (DISPOSABLE) ×1
HOOD PEEL AWAY FACE SHEILD DIS (HOOD) ×4 IMPLANT
IMMOBILIZER KNEE 22 UNIV (SOFTGOODS) ×2 IMPLANT
INSERT TIBIAL BEARING (Orthopedic Implant) ×2 IMPLANT
KIT BASIN OR (CUSTOM PROCEDURE TRAY) ×2 IMPLANT
KIT ROOM TURNOVER OR (KITS) ×2 IMPLANT
MANIFOLD NEPTUNE II (INSTRUMENTS) ×2 IMPLANT
NS IRRIG 1000ML POUR BTL (IV SOLUTION) ×2 IMPLANT
PACK TOTAL JOINT (CUSTOM PROCEDURE TRAY) ×2 IMPLANT
PAD ARMBOARD 7.5X6 YLW CONV (MISCELLANEOUS) ×4 IMPLANT
PAD CAST 4YDX4 CTTN HI CHSV (CAST SUPPLIES) IMPLANT
PADDING CAST COTTON 4X4 STRL (CAST SUPPLIES)
PADDING CAST COTTON 6X4 STRL (CAST SUPPLIES) IMPLANT
PADDING WEBRIL 6 STERILE (GAUZE/BANDAGES/DRESSINGS) ×2 IMPLANT
SET HNDPC FAN SPRY TIP SCT (DISPOSABLE) ×1 IMPLANT
SPONGE GAUZE 4X4 12PLY (GAUZE/BANDAGES/DRESSINGS) IMPLANT
SPONGE GAUZE 4X4 STERILE 39 (GAUZE/BANDAGES/DRESSINGS) ×2 IMPLANT
STAPLER VISISTAT 35W (STAPLE) ×2 IMPLANT
STRIP CLOSURE SKIN 1/2X4 (GAUZE/BANDAGES/DRESSINGS) IMPLANT
SUCTION FRAZIER TIP 10 FR DISP (SUCTIONS) ×2 IMPLANT
SUT MNCRL AB 4-0 PS2 18 (SUTURE) IMPLANT
SUT PDS AB 0 CT 36 (SUTURE) ×2 IMPLANT
SUT VIC AB 1 CT1 27 (SUTURE)
SUT VIC AB 1 CT1 27XBRD ANBCTR (SUTURE) IMPLANT
SUT VIC AB 2-0 CT1 27 (SUTURE)
SUT VIC AB 2-0 CT1 TAPERPNT 27 (SUTURE) IMPLANT
SUT VIC AB 3-0 SH 8-18 (SUTURE) ×2 IMPLANT
SYR 20CC LL (SYRINGE) ×2 IMPLANT
SYR 30ML LL (SYRINGE) ×2 IMPLANT
TOWEL OR 17X24 6PK STRL BLUE (TOWEL DISPOSABLE) ×2 IMPLANT
TOWEL OR 17X26 10 PK STRL BLUE (TOWEL DISPOSABLE) ×2 IMPLANT
TRAY FOLEY CATH 14FR (SET/KITS/TRAYS/PACK) ×2 IMPLANT
TUBE ANAEROBIC SPECIMEN COL (MISCELLANEOUS) ×2 IMPLANT
WATER STERILE IRR 1000ML POUR (IV SOLUTION) IMPLANT

## 2011-09-25 NOTE — Telephone Encounter (Signed)
New Msg: [name] [name] calling wanting to speak with nurse regarding getting surgical clearance for Knee IND and possible revision. Dr. Dion Saucier trying to get procedure on books at 2pm-2:30pm at [facility] today. Made aware that Dr. Excell Seltzer was not in the office today however nurse, Lauren, requested that msg be sent to her to review. Please return call to discuss further.

## 2011-09-25 NOTE — Anesthesia Postprocedure Evaluation (Signed)
Anesthesia Post Note  Patient: [name] [name]  Procedure(s) Performed:  IRRIGATION AND DEBRIDEMENT KNEE WITH POLY EXCHANGE  Anesthesia type: general  Patient location: PACU  Post pain: Pain level controlled  Post assessment: Patient's Cardiovascular Status Stable  Last Vitals:  Filed Vitals:   09/25/11 1739  BP: 136/67  Pulse:   Temp:   Resp:     Post vital signs: Reviewed and stable  Level of consciousness: sedated  Complications: No apparent anesthesia complications

## 2011-09-25 NOTE — Telephone Encounter (Signed)
Dr Antoine Poche (DOD) reviewed the pt's office note and cardiac cath (08/10/09).  Dr Antoine Poche spoke with Lestine Mount the PA and as long as the pt has not had any unstable angina then the pt can proceed with surgery.  BP in Ortho office is 102/61.

## 2011-09-25 NOTE — Telephone Encounter (Signed)
I spoke with Dr. Karle Plumber PA and was advised pt may possibly need emergency surgery for septic knee w/ total knee revision, irrigation and debridment. They are needing clearance ASAP from Dr. Kriste Basque. Please advise Dr. Kriste Basque thanks

## 2011-09-25 NOTE — Telephone Encounter (Signed)
Per SN--ok for surgery.  The last ov note has been faxed over to Dr. Karle Plumber office.

## 2011-09-25 NOTE — Op Note (Signed)
09/25/2011  4:57 PM  PATIENT:  [name] [name]    PRE-OPERATIVE DIAGNOSIS:  Infected  Left Knee arthroplasty  POST-OPERATIVE DIAGNOSIS:  Same  PROCEDURE:  IRRIGATION AND DEBRIDEMENT KNEE WITH POLY EXCHANGE and complete synovectomy, revision total knee  SURGEON:  Eulas Post, MD  PHYSICIAN ASSISTANT: Janace Litten, OPA-C, present and scrubbed throughout the case, critical for completion in a timely fashion, and for retraction, instrumentation, and closure.  ANESTHESIA:   General  PREOPERATIVE INDICATIONS:  [name] [name] is a  71 y.o. female with a diagnosis of Infected  Left Knee arthroplasty who lected for surgical management.  She had recently been to the dentist, proximally 4 weeks ago, and presented to the office with increasing knee pain. She did receive dental prophylaxis, but unfortunately aspiration demonstrated 90,000 white blood cells within her knee. She was recommended to have urgent surgical polyethylene exchange and synovectomy with irrigation and debridement.  The risks benefits and alternatives were discussed with the patient preoperatively including but not limited to the risks of infection, bleeding, nerve injury, cardiopulmonary complications, the need for revision surgery, among others, and the patient was willing to proceed. We also did discuss the risk for the need for revision surgery, incomplete removal of the infection, and even the potential for amputation.  OPERATIVE IMPLANTS: Stryker triathlon X3 tibial bearing insert posterior stabilized size 3 x 9 mm.  OPERATIVE FINDINGS: There was gross purulence within the joint. The components themselves were solid, and had no evidence for loosening. I did not appreciate any osteomyelitis.  Operative specimens: I sent aspiration fluid, for Gram stain, culture and sensitivity, although she had already received Ancef preoperatively.    OPERATIVE PROCEDURE: The patient is brought to the operating room placed in supine  position. General anesthesia had been administered. Time out was performed. The left lower extremity was prepped and draped in usual sterile fashion. IV antibiotics were given. The leg was elevated and exsanguinated and the tourniquet was inflated. Incision was made through his previous incision. Dissection was carried down through the quadriceps, and a quadriceps tendon splitting incision performed. Upon entering the joint there was a fairly substantial amount of turbid fluid which was sent to pathology/microbiology.  I performed a complete synovectomy, and had a clean layer of synovium which I excised in entirety. This was removed both on the medial as well as the lateral compartments. I also excised the fat pad. I used a rongeur as well as a pituitary to remove the soft tissue layer along the posterior capsule. I also used a curette.  After complete synovectomy was performed, I irrigated a total of 9 L through the knee. The wounds appeared very clean, and a changed gloves and got new instruments, and exposed the proximal tibia, and placed the new polyethylene insert. This seated down nicely.   I then irrigated once more, and repaired the capsule with 0 PDS suture. I also placed a total of 2 deep drains. The subcutaneous tissues closed with Vicryl, followed by routine closure with Steri-Strips and sterile gauze for the skin. A light compressive wrap was applied followed by knee immobilizer. She was awakened and returned to PACU in stable and satisfactory condition. Tourniquet time was approximately 1 hour 15 minutes.  There were no complications.

## 2011-09-25 NOTE — Preoperative (Signed)
Beta Blockers   Reason not to administer Beta Blockers:will give pre op

## 2011-09-25 NOTE — Progress Notes (Signed)
ANTIBIOTIC CONSULT NOTE - INITIAL  Pharmacy Consult for Vancomycin, Zosyn Indication: Infected lt knee arthroplasty  Allergies  Allergen Reactions  . Codeine     REACTION: nausea  . Prednisone     REACTION: rash  . Sulfonamide Derivatives     REACTION: rash  . Tetracycline     REACTION: rash    Patient Measurements: Height: 5\' 1"  (154.9 cm) Weight: 147 lb 11.3 oz (67 kg) IBW/kg (Calculated) : 47.8  Adjusted Body Weight:    Vital Signs: Temp: 98.3 F (36.8 C) (01/22 2139) Temp src: Oral (01/22 2139) BP: 135/67 mmHg (01/22 2139) Pulse Rate: 90  (01/22 2139) Intake/Output from previous day:   Intake/Output from this shift:    Labs:  Basename 09/25/11 1245  WBC 18.0*  HGB 13.0  PLT 231  LABCREA --  CREATININE 1.32*   Estimated Creatinine Clearance: 34.2 ml/min (by C-G formula based on Cr of 1.32). No results found for this basename: VANCOTROUGH:2,VANCOPEAK:2,VANCORANDOM:2,GENTTROUGH:2,GENTPEAK:2,GENTRANDOM:2,TOBRATROUGH:2,TOBRAPEAK:2,TOBRARND:2,AMIKACINPEAK:2,AMIKACINTROU:2,AMIKACIN:2, in the last 72 hours   Microbiology: No results found for this or any previous visit (from the past 720 hour(s)).  Medical History: Past Medical History  Diagnosis Date  . Acute bronchitis   . HTN (hypertension)   . CAD (coronary artery disease)   . Premature ventricular contractions   . Cerebrovascular disease, unspecified   . Mixed hyperlipidemia   . DM type 2 (diabetes mellitus, type 2)   . GERD (gastroesophageal reflux disease)   . Diverticular disease   . IBS (irritable bowel syndrome)   . Urinary incontinence   . DJD (degenerative joint disease)   . Chronic neck pain   . Chronic pain syndrome   . Dizziness   . Anxiety   . 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 aspirin EC  81 mg Oral Daily  . calcium-vitamin D  1 tablet Oral BID WC  .  ceFAZolin (ANCEF) IV  2 g Intravenous 60 min Pre-Op  . clopidogrel  75 mg Oral  Q breakfast  . dicyclomine  10 mg Oral TID AC & HS  . enoxaparin  30 mg Subcutaneous Q12H  . furosemide  20 mg Oral Daily  . glimepiride  1 mg Oral QAC breakfast  . losartan  100 mg Oral Daily  . metFORMIN  500 mg Oral BID WC  . methocarbamol(ROBAXIN) IV  500 mg Intravenous To PACU  . metoprolol tartrate  100 mg Oral Once  . metoprolol  100 mg Oral BID  . misoprostol  200 mcg Oral BID  . mupirocin ointment   Nasal Once  . niacin  1,000 mg Oral QHS  . pantoprazole  40 mg Oral Q1200  . patient's guide to using coumadin book   Does not apply Once  . piperacillin-tazobactam (ZOSYN)  IV  3.375 g Intravenous Q8H  . pregabalin  75 mg Oral BID  . psyllium  1 packet Oral BID  . simvastatin  20 mg Oral q1800  . trimethoprim  100 mg Oral QHS  . vancomycin  1,000 mg Intravenous Q24H  . venlafaxine  37.5 mg Oral Daily  . vitamin C  1,000 mg Oral Daily  . warfarin  5 mg Oral Once  . warfarin   Does not apply Once  . DISCONTD: aspirin  81 mg Oral Daily  . DISCONTD: calcium carbonate  600 mg Oral BID WC  . DISCONTD: furosemide  20 mg Oral Daily  . DISCONTD: losartan  25 mg Oral Daily  .  DISCONTD: metoprolol  25 mg Oral BID  . DISCONTD: pantoprazole  40 mg Oral Q1200  . DISCONTD: pregabalin  25 mg Oral Daily  . DISCONTD: psyllium  0.52 g Oral BID  . DISCONTD: simvastatin  20 mg Oral q1800  . DISCONTD: venlafaxine  25 mg Oral BID WC   Assessment: 71 yr old female for with infected lt knee arthroplasty for irrigation, debridement and revision of total knee. Pt received Ancef 2 GM preop.  Goal of Therapy:  Vancomycin trough level 15-20 mcg/ml  Plan:  Zosyn 3.375 Gm IV q8h and Vancomycin 1 Gm q24hr fot wt 67 kg and CrCl 34 ml/min.  Eugene Garnet 09/25/2011,9:57 PM

## 2011-09-25 NOTE — H&P (Signed)
  [facility] [address]   [address] [place], [place] [address] [phone] A Division of [facility]  Loreta Ave, M.D.     Robert A. Thurston Hole, M.D.     Lunette Stands, M.D. Eulas Post, M.D.    Buford Dresser, M.D. Estell Harpin, M.D. Ralene Cork, D.O.          Genene Churn. Barry Dienes, PA-C            Kirstin A. Shepperson, PA-C [place], OPA-C   RE: Dalani, Mette   [id number]      DOB: October 13, 1940 PROGRESS NOTE: 09-25-11 71 year old white female history of left acute knee pain returns. 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 Comes in to review labs performed yesterday. White blood cell 14.5 hemoglobin 13.9 hematocrit 42.5 platelets 231 sed-rate 4. Synovial fluid cell count with diff showed white blood cell 93,285 and positive intracellular calcium pyrophosphate crystals. Gram stain moderate white blood cells present with no organisms. Labs were sent STAT yesterday and I got a call report from [facility] lab around 6:30 this morning. Patient's knee pain is slightly improved after injection yesterday. Advise patient and her husband who is present that she will need to go to the Operating Room this afternoon for left knee irrigation and debridement and poly exchange. Cardiologist is Dr. Tonny Bollman and she was last seen by him 09/11/11. Dr. Excell Seltzer not in the office today but I spoke with his nurse and Dr. Antoine Poche who stated as long as patient has not had any cardiac issues since last visit with them she is stable from his standpoint to have surgery. Will also get recent notes from Dr. Jodelle Green office. Patient states other than her left knee issue she is doing well. No chest pain palpitations or shortness of breath, no GI or GU issues.   EXAMINATION: Alert and oriented x3 in no acute distress. Gait somewhat antalgic. Left knee has less swelling but diffuse tenderness, range of motion 0-115 degrees with discomfort. She  has some increased warmth. Calf non-tender neurovascularly intact.  Skin warm and dry. No increase in respiratory effort.   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 Question recent dental issues being a contributing factor.  PLAN: Will direct admit patient to [facility] today. Left knee irrigation and debridement and poly exchange discussed with potential rehab/recovery time. We will get infectious disease consult and patient will possibly need a PICC line. Dr. Dion Saucier will perform the surgery. All questions answered.  Genene Churn. Barry Dienes, PA-C  Electronically verified by Loreta Ave, M.D. DFM(JMO):kh D 09-25-11

## 2011-09-25 NOTE — Transfer of Care (Signed)
Immediate Anesthesia Transfer of Care Note  Patient: [name] [name]  Procedure(s) Performed:  IRRIGATION AND DEBRIDEMENT KNEE WITH POLY EXCHANGE  Patient Location: PACU  Anesthesia Type: General  Level of Consciousness: awake, alert , oriented and patient cooperative  Airway & Oxygen Therapy: Patient Spontanous Breathing and Patient connected to nasal cannula oxygen  Post-op Assessment: Report given to PACU RN, Post -op Vital signs reviewed and stable, Patient moving all extremities and Patient moving all extremities X 4  Post vital signs: Reviewed and stable  Complications: No apparent anesthesia complications

## 2011-09-25 NOTE — Progress Notes (Signed)
Pharmacy Coumadin Protocol  Coumadin for post op VTE prophylaxis.    Wiill give 5 mg tonight. Daily PT/INR.   Ordered coumadin educational booklet and video for pt.

## 2011-09-25 NOTE — H&P (Signed)
     [facility] [address]   [address] [place], [place] [address] [phone] A Division of [facility]  Loreta Ave, M.D.     Robert A. Thurston Hole, M.D.     Lunette Stands, M.D. Eulas Post, M.D.    Buford Dresser, M.D. Estell Harpin, M.D. Ralene Cork, D.O.          Genene Churn. Barry Dienes, PA-C            Kirstin A. Shepperson, PA-C [place], OPA-C   RE: [name] [name], [name] [name]   [id number]      DOB: 07/27/1941 PROGRESS NOTE: 09-24-11 Chief complaint: left knee pain. History of present illness: 71 year old white female with left knee pain. [name] status post left total knee replacement by Dr. Eulah Pont 11/15/10. Last seen 05/11/11 and was doing well pleased with surgery result. States that yesterday while riding a stationary bike she felt a sharp pain medial aspect of the left knee with sudden acute swelling. Since then she's had marked discomfort with ambulation and worsening swelling and pain. She had some chills last night. Denies prior problems with the knee prior to sudden onset. [name] not bee sick recently but did have dental work on 08/13/11 with an implant removed and bone graft placed. I spoke with an assistant at the dentist's office who stated there was no documented oral infection but she was placed on Keflex x10 days after the procedure and then went back to the office with ongoing pain and started on Clindamycin 150 mg. t.i.d. x7 days. Patient denies history of gout or pain in any other joints. Current medications: Advair, Plavix, Nitrostat, Toprol, Cozaar, Crestor, Niacin, fish oil CoQ10 Metformin Omeprazole cod liver oil, Lyrica, Caltrate multivitamin Meclizine.  Drug allergies: sulfa, tetracycline.  Past medical/surgical history: status post left total knee replacement 3/12, hypertension, coronary artery disease, CVA, hyperlipidemia, type II diabetes, GERD, diverticulosis cervical spondylosis chronic pain syndrome  vertigo anxiety cholecystectomy bladder repair hysterectomy TMJ surgery neck surgery appendectomy.  Family history: positive heart disease hypertension diabetes cancer. Social history: she's married and retired. Denies smoking or alcohol use.  Review of systems: all other systems unremarkable.  EXAMINATION: Temp 98.8 blood pressure 102/61. Height 5'1" weight 145 pounds. Alert and oriented x3 in no acute distress. Gait is antalgic. No increase in respiratory effort. Left knee has range of motion 5-110 degrees with marked discomfort. Knee is diffusely exquisitely tender. Some increased warmth. 2+ effusion. Calf non-tender neurovascularly intact. Bilateral hip exam unremarkable.  X-RAYS: Left knee AP lateral sunrise views show good seating and alignment of prosthesis. No acute changes.  IMPRESSION: Acute left knee pain possibly secondary to septic joint. 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   PLAN: After patient consent left knee prepped with Betadine using 1 cc. 1% Xylocaine for local anesthetic 50 cc. of cloudy serosanguineous fluid aspirated from a superolateral patellar approach and interarticular 2:6 Depo-Medrol/Marcaine injection performed. Tolerated procedure well without complication. Joint fluid sent to lab for analysis for STAT report. Also did blood work for STAT CBC with diff and sed-rate. Advised patient will be in contact with her and husband in regards to results. If any issues worsen tonight they will contact on-call service immediately.   Genene Churn. Barry Dienes, PA-C  Electronically verified by Loreta Ave, M.D. DFM(JMO):kh D 09-25-11 T 09-25-11

## 2011-09-25 NOTE — Anesthesia Preprocedure Evaluation (Signed)
Anesthesia Evaluation  Patient identified by MRN, date of birth, ID band Patient awake    Reviewed: Allergy & Precautions, H&P , NPO status , Patient's Chart, lab work & pertinent test results, reviewed documented beta blocker date and time   Airway Mallampati: II TM Distance: >3 FB Neck ROM: Full    Dental No notable dental hx. (+) Teeth Intact and Dental Advisory Given   Pulmonary  clear to auscultation        Cardiovascular Regular Normal    Neuro/Psych    GI/Hepatic   Endo/Other    Renal/GU      Musculoskeletal   Abdominal   Peds  Hematology   Anesthesia Other Findings   Reproductive/Obstetrics                           Anesthesia Physical Anesthesia Plan  ASA: III  Anesthesia Plan: General   Post-op Pain Management:    Induction: Intravenous  Airway Management Planned: LMA  Additional Equipment:   Intra-op Plan:   Post-operative Plan: Extubation in OR  Informed Consent: I have reviewed the patients History and Physical, chart, labs and discussed the procedure including the risks, benefits and alternatives for the proposed anesthesia with the patient or authorized representative who has indicated his/her understanding and acceptance.   Dental advisory given  Plan Discussed with: CRNA, Anesthesiologist and Surgeon  Anesthesia Plan Comments:         Anesthesia Quick Evaluation

## 2011-09-25 NOTE — H&P (Signed)
PREOPERATIVE H&P  Chief Complaint: Infected  Left Knee  HPI: [name] [name] is a 71 y.o. female who presents for preoperative history and physical with a diagnosis of Infected  Left Knee. Symptoms are rated as moderate to severe, and have been worsening.  This is significantly impairing activities of daily living.  She has elected for surgical management. She had dental work done about 5 weeks ago, and then had progressive knee pain. This has gotten severe in the last week. She had a knee aspiration done in the office which demonstrated 90,000 white blood cells. She had been doing very well up until the time of her dental work. She did receive preoperative antibiotics in the form of cephalexin from her dentist. She apparently required to dental procedures. She has reported recent fevers and chills, although she has not formally taken her temperature. She has not had any other recent illnesses.  Past Medical History  Diagnosis Date  . Acute bronchitis   . HTN (hypertension)   . CAD (coronary artery disease)   . Premature ventricular contractions   . Cerebrovascular disease, unspecified   . Mixed hyperlipidemia   . DM type 2 (diabetes mellitus, type 2)   . GERD (gastroesophageal reflux disease)   . Diverticular disease   . IBS (irritable bowel syndrome)   . Urinary incontinence   . DJD (degenerative joint disease)   . Chronic neck pain   . Chronic pain syndrome   . Dizziness   . Anxiety    Past Surgical History  Procedure Date  . Cholecystectomy   . Cervical spine surgery 07/2005    Dr Sharde East  . Bladder repair   . Total abdominal hysterectomy   . Tonsillectomy   . Thumb surgery   . Neck surgery   . Temporomandibular joint surgery   . Left total knee replacement 11/2010    Dr Eulah Pont   History   Social History  . Marital Status: Married    Spouse Name: dolly Saal    Number of Children: N/A  . Years of Education: N/A   Occupational History  .     Social History Main  Topics  . Smoking status: Never Smoker   . Smokeless tobacco: Never Used  . Alcohol Use: No  . Drug Use: No  . Sexually Active: Not on file   Other Topics Concern  . Not on file   Social History Narrative   Pt never knew her fatherDaily caffeine use   Family History  Problem Relation Age of Onset  . Heart disease Mother   . Coronary artery disease Other     sibling  . Coronary artery disease Other     sibling   Allergies  Allergen Reactions  . Codeine     REACTION: nausea  . Prednisone     REACTION: rash  . Sulfonamide Derivatives     REACTION: rash  . Tetracycline     REACTION: rash   Prior to Admission medications   Medication Sig Start Date End Date Taking? Authorizing Provider  Aloe Vera 25 MG CAPS Take 1 capsule by mouth 2 (two) times daily.     Yes Historical Provider, MD  Ascorbic Acid (VITAMIN C) 1000 MG tablet Take 1,000 mg by mouth daily.     Yes Historical Provider, MD  aspirin 81 MG tablet Take 81 mg by mouth daily.     Yes Historical Provider, MD  calcium carbonate (OS-CAL) 600 MG TABS Take 600 mg by mouth 2 (two)  times daily with a meal.     Yes Historical Provider, MD  Cholecalciferol (VITAMIN D3) 1000 UNITS CAPS Take 1 capsule by mouth daily.     Yes Historical Provider, MD  Cinnamon 500 MG capsule Take 500 mg by mouth daily.     Yes Historical Provider, MD  clopidogrel (PLAVIX) 75 MG tablet Take 1 tablet by mouth Daily. 08/31/11  Yes Historical Provider, MD  Cod Liver Oil 1000 MG CAPS Take 1 capsule by mouth 2 (two) times daily.     Yes Historical Provider, MD  Coenzyme Q10 (CO Q 10) 100 MG CAPS Take 1 capsule by mouth daily.     Yes Historical Provider, MD  diclofenac (VOLTAREN) 75 MG EC tablet Take 75 mg by mouth 2 (two) times daily. 1 tablet by mouth twice a day. Take with food 12/15/10 09/25/11 Yes Michele Mcalpine, MD  dicyclomine (BENTYL) 10 MG capsule Take 10 mg by mouth 4 (four) times daily -  before meals and at bedtime.    Yes Historical Provider, MD    Flaxseed, Linseed, 1000 MG CAPS Take 3 capsules by mouth 2 (two) times daily.     Yes Historical Provider, MD  furosemide (LASIX) 20 MG tablet Take 20 mg by mouth daily.   Yes Historical Provider, MD  Garlic 1000 MG CAPS Take 2 capsules by mouth at bedtime.     Yes Historical Provider, MD  glimepiride (AMARYL) 1 MG tablet Take 1 mg by mouth daily before breakfast.     Yes Historical Provider, MD  Glucosamine-Chondroit-Vit C-Mn (GLUCOSAMINE CHONDR 1500 COMPLX PO) Take 1 capsule by mouth 2 (two) times daily.     Yes Historical Provider, MD  losartan (COZAAR) 100 MG tablet Take 100 mg by mouth daily.   Yes Historical Provider, MD  metFORMIN (GLUCOPHAGE) 500 MG tablet Take 500 mg by mouth 2 (two) times daily with a meal.     Yes Historical Provider, MD  metoprolol (LOPRESSOR) 100 MG tablet Take 100 mg by mouth 2 (two) times daily.   Yes Historical Provider, MD  Multiple Vitamin (STRESSTABS PO) Take 1 tablet by mouth at bedtime.     Yes Historical Provider, MD  niacin 500 MG tablet Take 1,000 mg by mouth at bedtime.    Yes Historical Provider, MD  Omega-3 Fatty Acids (FISH OIL) 1000 MG CAPS Take 1 capsule by mouth 4 (four) times daily.     Yes Historical Provider, MD  omeprazole (PRILOSEC) 20 MG capsule Take 40 mg by mouth daily.   Yes Historical Provider, MD  pravastatin (PRAVACHOL) 40 MG tablet Take 40 mg by mouth every evening. 01/10/11 01/10/12 Yes Michele Mcalpine, MD  pregabalin (LYRICA) 75 MG capsule Take 75 mg by mouth 2 (two) times daily.   Yes Historical Provider, MD  psyllium (REGULOID) 0.52 G capsule Take 0.52 g by mouth 2 (two) times daily.     Yes Historical Provider, MD  Red Yeast Rice 600 MG CAPS Take 1 capsule by mouth 2 (two) times daily.     Yes Historical Provider, MD  trimethoprim (TRIMPEX) 100 MG tablet Take 1 tablet by mouth at bedtime. 08/18/11  Yes Historical Provider, MD  venlafaxine (EFFEXOR) 37.5 MG tablet Take 37.5 mg by mouth daily.   Yes Historical Provider, MD  nitroGLYCERIN  (NITROSTAT) 0.4 MG SL tablet Place 0.4 mg under the tongue every 5 (five) minutes as needed. For chest pain 09/11/11   Micheline Chapman, MD     Positive ROS: All  other systems have been reviewed and were otherwise negative with the exception of those mentioned in the HPI and as above.  Physical Exam: General: Alert, no acute distress Cardiovascular: No pedal edema Respiratory: No cyanosis, no use of accessory musculature GI: No organomegaly, abdomen is soft and non-tender Skin: Her surgical wounds are well approximated and have healed. She has a well-healed scar. Neurologic: Sensation intact distally Psychiatric: Patient is competent for consent with normal mood and affect Lymphatic: No axillary or cervical lymphadenopathy  MUSCULOSKELETAL: Left knee has warmth to the touch. Minimal erythema. Range of motion from 5 to 80. She is much more pain and deeper flexion. Her alignment is appropriate.  Assessment: Infected  Left Knee arthroplasty  Plan: Plan for Procedure(s): Irrigation and debridement, left knee arthroplasty, with poly-exchange, revision.  The risks benefits and alternatives were discussed with the patient including but not limited to the risks of nonoperative treatment, versus surgical intervention including infection, bleeding, nerve injury,  blood clots, cardiopulmonary complications, morbidity, mortality, among others, and they were willing to proceed. We discussed the potential for incomplete removal of all of the infection, and the need for further surgical intervention with multiple stage explantation, as well as the need for IV antibiotics, among multiple other complications including transfusion. We've also discussed the potential for amputation. She will be admitted to the hospital for emergent surgical intervention, and then placement of a PICC line with infectious disease consultation and long-term IV antibiotics.  Eulas Post, MD 09/25/2011 3:11 PM

## 2011-09-25 NOTE — Anesthesia Postprocedure Evaluation (Signed)
Anesthesia Post Note  Patient: [name] [name]  Procedure(s) Performed:  IRRIGATION AND DEBRIDEMENT KNEE WITH POLY EXCHANGE  Anesthesia type: general  Patient location: PACU  Post pain: Pain level controlled  Post assessment: Patient's Cardiovascular Status Stable  Last Vitals:  Filed Vitals:   09/25/11 1754  BP: 135/61  Pulse: 87  Temp:   Resp: 19    Post vital signs: Reviewed and stable  Level of consciousness: sedated  Complications: No apparent anesthesia complications

## 2011-09-26 ENCOUNTER — Encounter (HOSPITAL_COMMUNITY): Payer: Self-pay | Admitting: Orthopedic Surgery

## 2011-09-26 DIAGNOSIS — Y849 Medical procedure, unspecified as the cause of abnormal reaction of the patient, or of later complication, without mention of misadventure at the time of the procedure: Secondary | ICD-10-CM

## 2011-09-26 DIAGNOSIS — T8450XA Infection and inflammatory reaction due to unspecified internal joint prosthesis, initial encounter: Secondary | ICD-10-CM

## 2011-09-26 LAB — CBC
HCT: 35.2 % — ABNORMAL LOW (ref 36.0–46.0)
Hemoglobin: 11.6 g/dL — ABNORMAL LOW (ref 12.0–15.0)
MCH: 32.7 pg (ref 26.0–34.0)
MCHC: 33 g/dL (ref 30.0–36.0)
RDW: 13.8 % (ref 11.5–15.5)

## 2011-09-26 LAB — GLUCOSE, CAPILLARY
Glucose-Capillary: 97 mg/dL (ref 70–99)
Glucose-Capillary: 94 mg/dL (ref 70–99)

## 2011-09-26 LAB — BASIC METABOLIC PANEL
BUN: 22 mg/dL (ref 6–23)
Calcium: 8.1 mg/dL — ABNORMAL LOW (ref 8.4–10.5)
GFR calc Af Amer: 55 mL/min — ABNORMAL LOW (ref >90)
GFR calc non Af Amer: 48 mL/min — ABNORMAL LOW (ref >90)
Glucose, Bld: 110 mg/dL — ABNORMAL HIGH (ref 70–99)

## 2011-09-26 LAB — PROTIME-INR: Prothrombin Time: 15.1 seconds (ref 11.6–15.2)

## 2011-09-26 MED ORDER — SODIUM CHLORIDE 0.45 % IV SOLN
INTRAVENOUS | Status: DC
  Administered 2011-09-26: 16:00:00 via INTRAVENOUS

## 2011-09-26 MED ORDER — WARFARIN SODIUM 5 MG PO TABS
5.0000 mg | ORAL_TABLET | Freq: Once | ORAL | Status: AC
  Administered 2011-09-26: 5 mg via ORAL
  Filled 2011-09-26: qty 1

## 2011-09-26 MED ORDER — SODIUM CHLORIDE 0.9 % IJ SOLN
10.0000 mL | INTRAMUSCULAR | Status: DC | PRN
  Administered 2011-09-27: 10 mL

## 2011-09-26 NOTE — Progress Notes (Signed)
Physical Therapy Treatment Patient Details Name: [name] [name] MRN: [id number] DOB: [birth year] Today's Date: 09/26/2011  PT Assessment/Plan  PT - Assessment/Plan Comments on Treatment Session: Pt c/o 6/10 pain in L knee prior to treatment (had just been medicated).  Pt wanting to get OOB for lunch.  Pt reports 8/10 pain with activity.  Ice pack on L LE and repositioned patient. PT Plan: Discharge plan remains appropriate PT Frequency: 7X/week Follow Up Recommendations: Home health PT Equipment Recommended: None recommended by OT PT Goals  Acute Rehab PT Goals PT Goal: Supine/Side to Sit - Progress: Progressing toward goal PT Goal: Sit to Stand - Progress: Progressing toward goal PT Goal: Ambulate - Progress: Progressing toward goal PT Goal: Perform Home Exercise Program - Progress: Progressing toward goal  PT Treatment Precautions/Restrictions  Precautions Required Braces or Orthoses: Yes Knee Immobilizer: Discontinue post op day 2 Restrictions Weight Bearing Restrictions: Yes LLE Weight Bearing: Weight bearing as tolerated Mobility (including Balance) Bed Mobility Bed Mobility: Yes Supine to Sit: 5: Supervision;HOB elevated (Comment degrees) (30 degrees) Supine to Sit Details (indicate cue type and reason): Requires increased time to complete task Transfers Transfers: Yes Sit to Stand: 4: Min assist;With upper extremity assist;From bed Sit to Stand Details (indicate cue type and reason): Verbal cues for hand placement Ambulation/Gait Ambulation/Gait: Yes Ambulation/Gait Assistance: 4: Min assist Ambulation Distance (Feet): 10 Feet (Limited by c/o dizziness after receiving pain meds) Assistive device: Rolling walker Gait Pattern: Step-to pattern;Decreased step length - left;Decreased stance time - left Gait velocity: decreased cadence Stairs: No Wheelchair Mobility Wheelchair Mobility: No    Exercise  Total Joint Exercises Ankle Circles/Pumps: AROM;10  reps;Seated;Both Quad Sets: AROM;Left;10 reps;Supine Short Arc Quad: AROM;Left;10 reps;Supine Heel Slides:  (Pt declined secondary to pain) End of Session PT - End of Session Equipment Utilized During Treatment: Gait belt Activity Tolerance: Patient limited by pain Patient left: in chair;with call bell in reach;with family/visitor present General Behavior During Session: [facility] for tasks performed Cognition: [facility] for tasks performed  Newell Coral 09/26/2011, 1:11 PM  Newell Coral, PTA [phone]

## 2011-09-26 NOTE — Consult Note (Signed)
Attending addendum: Mrs. 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 The infection was recognized promptly, treated with appropriatly aggressive surgical therapy and the prosthesis remains well cemented. Therefore it is reasonable to try to salvage the prosthesis and cure the infection with antibiotic therapy. Once the gram-negative rod is identified and susceptibilities are available I will recommend outpatient antibiotic therapy. If it is susceptible to a quinolone then it is reasonable to use oral therapy, if not she may need to be discharged on intravenous antibiotics. She will need at least 6 weeks of total therapy. We will followup tomorrow.  Cliffton Asters, MD [facility] for Infectious Diseases [facility] [phone] pager   [phone] cell 09/26/2011, 6:12 PM

## 2011-09-26 NOTE — Consult Note (Signed)
Infectious Diseases Initial Consultation         Date of Admission:  09/25/2011  Date of Consult:  09/26/2011  Reason for Consult: Prosthetic joint infection Referring Physician: Dr. Dion Saucier  Problem List:  1. 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 Stop Vancomycin 2. Continue Zosyn  3. Await culture results for species and sensitivities to guide further antibiotic narrowing.   Assessment: 1. 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  She was started on Vanc and Zosyn.  Preliminary cultures grew a lactose fermenting gram negative rod which is likely an Enterobacteriaceae like E. Coli or klebsiella.  Per the IDSA guidelines treatment for these type of infections include treatment with an IV beta lactam per the sensitivities or IV/PO fluoroquinolone for 4-6 weeks.  With no gram positive bacteria growing at this time we will stop the Vancomycin and continue the Zosyn pending further sensitivities.  She has a PICC line placed already in anticipation of prolonged IV antibiotics.    HPI: [name] [name] is a 71 y.o. female who underwent left TKA in March of 2012.  She was riding bike on Sunday 1/20 and felt a pop in her left knee.  She noticed swelling, circumferential redness in the knee as well as some chills that evening.  She visited her surgeon, Dr. Dion Saucier and underwent an arthrocentesis on 1/21.  The next morning results of the arthrocentesis showed >90, 000 WBCs and intracellular pryophosphate crystals.  She was admitted to [facility] ED and underwent extensive debridement of the knee joint with removal of the synovium and fat pad.  Synovial fluid was found to be turbid and was sent for culture.  Dr. Dion Saucier also exchanged the poly spacer and noted that the prosthesis was stable in the joint.  She was placed on Vanc and Zosyn and ID was consulted to help  guide the choice of antibiotic therapy. She reports today that her pain is much less and she denies fevers, chills, nausea, vomiting, diarrhea, cough, rash, chest pain, or abdominal pain.  Of note she had a dental procedure done in 08/13/11 and was given 10 days of Keflex followed by 7 days of Clindamycin.  She also has a UTI on admission today with >100,000 colonies of a gram negative rod noted.     Review of Systems: Constitutional: Denies fever, chills, diaphoresis, appetite change and fatigue.  HEENT: Denies photophobia, eye pain, redness, hearing loss, ear pain, congestion, sore throat, rhinorrhea, sneezing, mouth sores, trouble swallowing, neck pain, neck stiffness and tinnitus.   Respiratory: Denies SOB, DOE, cough, chest tightness,  and wheezing.   Cardiovascular: Denies chest pain, palpitations and leg swelling.  Gastrointestinal: Denies nausea, vomiting, abdominal pain, diarrhea, constipation, blood in stool and abdominal distention.  Genitourinary: Denies dysuria, urgency, frequency, hematuria, flank pain and difficulty urinating.  Musculoskeletal: Positive for joint swelling, left knee pain. Denies myalgias, back pain, joint swelling, arthralgias and gait problem.  Skin: Denies pallor, rash and wound.  Neurological: Denies dizziness, seizures, syncope, weakness, light-headedness, numbness and headaches.  Hematological: Denies adenopathy. Easy bruising, personal or family bleeding history  Psychiatric/Behavioral: Denies suicidal ideation, mood changes, confusion, nervousness, sleep disturbance and agitation    . aspirin EC  81 mg Oral Daily  . calcium-vitamin D  1 tablet Oral BID WC  . clopidogrel  75 mg Oral Q breakfast  . dicyclomine  10 mg Oral TID AC & HS  .  enoxaparin  30 mg Subcutaneous Q12H  . furosemide  20 mg Oral Daily  . glimepiride  1 mg Oral QAC breakfast  . metFORMIN  500 mg Oral BID WC  . methocarbamol(ROBAXIN) IV  500 mg Intravenous To PACU  . metoprolol tartrate   100 mg Oral Once  . metoprolol  100 mg Oral BID  . misoprostol  200 mcg Oral BID  . mupirocin ointment   Nasal Once  . niacin  1,000 mg Oral QHS  . pantoprazole  40 mg Oral Q1200  . patient's guide to using coumadin book   Does not apply Once  . piperacillin-tazobactam (ZOSYN)  IV  3.375 g Intravenous Q8H  . pregabalin  75 mg Oral BID  . psyllium  1 packet Oral BID  . simvastatin  20 mg Oral q1800  . trimethoprim  100 mg Oral QHS  . vancomycin  1,000 mg Intravenous Q24H  . venlafaxine  37.5 mg Oral Daily  . vitamin C  1,000 mg Oral Daily  . warfarin  5 mg Oral Once  . warfarin  5 mg Oral ONCE-1800  . warfarin   Does not apply Once  . DISCONTD: aspirin  81 mg Oral Daily  . DISCONTD: calcium carbonate  600 mg Oral BID WC  . DISCONTD: furosemide  20 mg Oral Daily  . DISCONTD: losartan  100 mg Oral Daily  . DISCONTD: losartan  25 mg Oral Daily  . DISCONTD: metoprolol  25 mg Oral BID  . DISCONTD: pantoprazole  40 mg Oral Q1200  . DISCONTD: pregabalin  25 mg Oral Daily  . DISCONTD: psyllium  0.52 g Oral BID  . DISCONTD: simvastatin  20 mg Oral q1800  . DISCONTD: venlafaxine  25 mg Oral BID WC   Past Medical History  Diagnosis Date  . Acute bronchitis   . HTN (hypertension)   . CAD (coronary artery disease)   . Premature ventricular contractions   . Cerebrovascular disease, unspecified   . Mixed hyperlipidemia   . DM type 2 (diabetes mellitus, type 2)   . GERD (gastroesophageal reflux disease)   . Diverticular disease   . IBS (irritable bowel syndrome)   . Urinary incontinence   . DJD (degenerative joint disease)   . Chronic neck pain   . Chronic pain syndrome   . Dizziness   . Anxiety   . 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 Smoking status: Never Smoker   . Smokeless tobacco: Never Used  . Alcohol Use: No   Family History  Problem Relation Age of Onset  . Heart disease Mother   . Coronary artery disease Other      sibling  . Coronary artery disease Other     sibling   Allergies  Allergen Reactions  . Codeine     REACTION: nausea  . Prednisone     REACTION: rash  . Sulfonamide Derivatives     REACTION: rash  . Tetracycline     REACTION: rash   OBJECTIVE: Blood pressure 106/63, pulse 90, temperature 98.8 F (37.1 C), temperature source Oral, resp. rate 18, height 5\' 1"  (1.549 m), weight 147 lb 11.3 oz (67 kg), SpO2 94.00%.  Vitals reviewed. General: resting in bed, NAD HEENT: PERRL, EOMI, no scleral icterus, fair dentition. No oral lesions noted.  Cardiac: RRR, no rubs, murmurs or gallops Pulm: clear to auscultation bilaterally, no wheezes, rales, or rhonchi Abd: soft, nontender, nondistended, BS present Ext: left knee  in brace.  Underlying ACE bandage with no shadowing noted.  No pain to palpation.  warm and well perfused, no pedal edema Neuro: alert and oriented X3, cranial nerves II-XII grossly intact, strength and sensation to light touch equal in bilateral upper and lower extremities  Microbiology: Recent Results (from the past 240 hour(s))  URINE CULTURE     Status: Normal (Preliminary result)   Collection Time   09/25/11 12:55 PM      Component Value Range Status Comment   Specimen Description URINE, CLEAN CATCH   Final    Special Requests NONE   Final    Setup Time [id number]   Final    Colony Count >=100,000 COLONIES/ML   Final    Culture GRAM NEGATIVE RODS   Final    Report Status PENDING   Incomplete   BODY FLUID CULTURE     Status: Normal (Preliminary result)   Collection Time   09/25/11  4:04 PM      Component Value Range Status Comment   Specimen Description SYNOVIAL KNEE LEFT   Final    Special Requests NONE   Final    Gram Stain     Final    Value: ABUNDANT WBC PRESENT,BOTH PMN AND MONONUCLEAR     NO ORGANISMS SEEN   Culture FEW GRAM NEGATIVE RODS   Final    Report Status PENDING   Incomplete   ANAEROBIC CULTURE     Status: Normal (Preliminary result)    Collection Time   09/25/11  4:04 PM      Component Value Range Status Comment   Specimen Description SYNOVIAL KNEE LEFT   Final    Special Requests NONE   Final    Gram Stain     Final    Value: ABUNDANT WBC PRESENT,BOTH PMN AND MONONUCLEAR     NO SQUAMOUS EPITHELIAL CELLS SEEN     NO ORGANISMS SEEN   Culture     Final    Value: NO ANAEROBES ISOLATED; CULTURE IN PROGRESS FOR 5 DAYS   Report Status PENDING   Incomplete   TISSUE CULTURE     Status: Normal (Preliminary result)   Collection Time   09/25/11  4:08 PM      Component Value Range Status Comment   Specimen Description TISSUE KNEE LEFT   Final    Special Requests NONE   Final    Gram Stain     Final    Value: MODERATE WBC PRESENT,BOTH PMN AND MONONUCLEAR     NO SQUAMOUS EPITHELIAL CELLS SEEN     NO ORGANISMS SEEN   Culture NO GROWTH   Final    Report Status PENDING   Incomplete   ANAEROBIC CULTURE     Status: Normal (Preliminary result)   Collection Time   09/25/11  4:08 PM      Component Value Range Status Comment   Specimen Description TISSUE KNEE LEFT   Final    Special Requests NONE   Final    Gram Stain     Final    Value: MODERATE WBC PRESENT,BOTH PMN AND MONONUCLEAR     NO SQUAMOUS EPITHELIAL CELLS SEEN     NO ORGANISMS SEEN   Culture     Final    Value: NO ANAEROBES ISOLATED; CULTURE IN PROGRESS FOR 5 DAYS   Report Status PENDING   Incomplete    [name] [name] 09/26/2011 5:15 PM

## 2011-09-26 NOTE — Progress Notes (Signed)
Occupational Therapy Evaluation Patient Details Name: [name] [name] MRN: [id number] DOB: 1941-04-05 Today's Date: 09/26/2011  Problem List:  Patient Active Problem List  Diagnoses  . DIABETES MELLITUS, TYPE II  . MIXED HYPERLIPIDEMIA  . ANXIETY  . SYNDROME, CHRONIC PAIN  . HYPERTENSION, UNSPECIFIED  . CORONARY ARTERY DISEASE  . PREMATURE VENTRICULAR CONTRACTIONS  . CEREBROVASCULAR DISEASE  . BRONCHITIS, ACUTE  . GASTROESOPHAGEAL REFLUX DISEASE  . DIVERTICULAR DISEASE  . IBS  . ACUTE CYSTITIS  . DEGENERATIVE JOINT DISEASE  . NECK PAIN, CHRONIC  . DIZZINESS, CHRONIC  . URINARY INCONTINENCE  . MITRAL VALVE PROLAPSE, HX OF  . Diarrhea  . Infection of prosthetic knee joint, left    Past Medical History:  Past Medical History  Diagnosis Date  . Acute bronchitis   . HTN (hypertension)   . CAD (coronary artery disease)   . Premature ventricular contractions   . Cerebrovascular disease, unspecified   . Mixed hyperlipidemia   . DM type 2 (diabetes mellitus, type 2)   . GERD (gastroesophageal reflux disease)   . Diverticular disease   . IBS (irritable bowel syndrome)   . Urinary incontinence   . DJD (degenerative joint disease)   . Chronic neck pain   . Chronic pain syndrome   . Dizziness   . Anxiety   . 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 Cholecystectomy   . Cervical spine surgery 07/2005    Dr Malesha East  . Bladder repair   . Total abdominal hysterectomy   . Tonsillectomy   . Thumb surgery   . Neck surgery   . Temporomandibular joint surgery   . Left total knee replacement 11/2010    Dr Eulah Pont    OT Assessment/Plan/Recommendation OT Assessment Clinical Impression Statement: pt. presents s/p L knee revision and with increased pain. Pt. educated on techniques for completing ADLs and use of AE to perform LB ADLs and shower transfer. All education completed and recommend D/C home. OT  Recommendation/Assessment: Patient does not need any further OT services OT Recommendation Follow Up Recommendations: No OT follow up Equipment Recommended: None recommended by OT OT Goals  None needed  OT Evaluation Precautions/Restrictions  Precautions Required Braces or Orthoses: Yes Knee Immobilizer: Discontinue post op day 2 Restrictions Weight Bearing Restrictions: Yes LLE Weight Bearing: Weight bearing as tolerated Prior Functioning Home Living Lives With: Spouse Type of Home: House Home Layout: One level Home Access: Stairs to enter Entrance Stairs-Rails: None Entrance Stairs-Number of Steps: 2 Bathroom Shower/Tub: Psychologist, counselling;Tub/shower unit Bathroom Toilet: Standard Bathroom Accessibility: Yes How Accessible: Accessible via walker Home Adaptive Equipment: Bedside commode/3-in-1;Reacher;Sock aid;Walker - rolling;Shower chair with back Prior Function Level of Independence: Independent with basic ADLs;Independent with gait;Requires assistive device for independence Able to Take Stairs?: Yes Driving: Yes Vocation: Retired ADL ADL Eating/Feeding: Performed;Independent Where Assessed - Eating/Feeding: Chair Grooming: Performed;Wash/dry face;Wash/dry hands;Set up;Supervision/safety Where Assessed - Grooming: Standing at sink Upper Body Bathing: Simulated;Chest;Right arm;Left arm;Abdomen;Set up Where Assessed - Upper Body Bathing: Sitting, bed Lower Body Bathing: Simulated;Set up;Supervision/safety Where Assessed - Lower Body Bathing: Sit to stand from bed Upper Body Dressing: Simulated;Set up Upper Body Dressing Details (indicate cue type and reason): donning gown Where Assessed - Upper Body Dressing: Sitting, bed Lower Body Dressing: Simulated;Set up;Other (comment) (pt.verbalizes use of AE to perform) Lower Body Dressing Details (indicate cue type and reason): With AE for LB dressing Where Assessed - Lower Body Dressing: Sitting, bed  Toilet Transfer:  Performed;Minimal Dentist Details (indicate cue type and reason): min verbal cues for hand placement and technique Toilet Transfer Method: Stand pivot Toilet Transfer Equipment: Bedside commode Toileting - Clothing Manipulation: Performed;Set up Toileting - Clothing Manipulation Details (indicate cue type and reason): with moving gown Where Assessed - Toileting Clothing Manipulation: Standing Toileting - Hygiene: Performed;Set up Where Assessed - Toileting Hygiene: Sit to stand from 3-in-1 or toilet Tub/Shower Transfer: Performed;Supervision/safety Tub/Shower Transfer Details (indicate cue type and reason): Pt. educated on transfer technique for backwards entrance into shower stall with use of RW and stepping with strong leg first. Tub/Shower Transfer Method: Ambulating Equipment Used: Rolling walker Ambulation Related to ADLs: Pt. provided with close supervision with ambulation ~10' with rw ADL Comments: Pt. educated on shower transfer and use of AE for LB ADLs. Pt. has AE from prior surgery.     Cognition Cognition Arousal/Alertness: Awake/alert Overall Cognitive Status: Appears within functional limits for tasks assessed Orientation Level: Oriented X4 Sensation/Coordination Sensation Light Touch: Appears Intact Extremity Assessment RUE Assessment RUE Assessment: Within Functional Limits LUE Assessment LUE Assessment: Within Functional Limits Mobility  Bed Mobility Bed Mobility: Yes Supine to Sit: 5: Supervision;HOB flat Transfers Transfers: Yes Sit to Stand: 4: Min assist;From bed Sit to Stand Details (indicate cue type and reason): min verbal cues for safety with hand placement on RW and bed   End of Session OT - End of Session Equipment Utilized During Treatment: Gait belt Activity Tolerance: Patient tolerated treatment well Patient left: Other (comment) (ambulating with P.T. in hallway) Nurse Communication: Mobility status for  transfers General Behavior During Session: [facility] for tasks performed Cognition: [facility] for tasks performed   Merle Cirelli, OTR/L Pager [phone] 09/26/2011, 11:29 AM

## 2011-09-26 NOTE — Progress Notes (Signed)
Subjective: 1 Day Post-Op Procedure(s) (LRB): IRRIGATION AND DEBRIDEMENT KNEE WITH POLY EXCHANGE (Left) Patient reports pain as 6 on 0-10 scale and 7 on 0-10 scale.   She is otherwise doing reasonably well. Objective: Vital signs in last 24 hours: Temp:  [97.3 F (36.3 C)-99.2 F (37.3 C)] 99 F (37.2 C) (01/23 0631) Pulse Rate:  [79-97] 87  (01/23 0631) Resp:  [11-24] 18  (01/23 0631) BP: (116-140)/(56-72) 119/62 mmHg (01/23 0631) SpO2:  [95 %-99 %] 99 % (01/23 0631) Weight:  [67 kg (147 lb 11.3 oz)] 67 kg (147 lb 11.3 oz) (01/22 1909)  Intake/Output from previous day: 01/22 0701 - 01/23 0700 In: 3190 [P.O.:240; I.V.:2750; IV Piggyback:200] Out: 1925 [Urine:1700; Drains:225] Intake/Output this shift:     Basename 09/26/11 0520 09/25/11 1245  HGB 11.6* 13.0    Basename 09/26/11 0520 09/25/11 1245  WBC 11.9* 18.0*  RBC 3.55* 3.98  HCT 35.2* 37.8  PLT 232 231    Basename 09/26/11 0520 09/25/11 1245  NA 130* 133*  K 5.2* 4.5  CL 95* 96  CO2 28 28  BUN 22 34*  CREATININE 1.13* 1.32*  GLUCOSE 110* 107*  CALCIUM 8.1* 8.8    Basename 09/26/11 0520 09/25/11 1245  LABPT -- --  INR 1.17 1.17   Aspiration done in the office demonstrated gain gram-negative rods. Surgical cultures are currently pending.  ABD soft Sensation intact distally Dorsiflexion/Plantar flexion intact Incision: dressing C/D/I and no drainage  Assessment/Plan: 1 Day Post-Op Procedure(s) (LRB): IRRIGATION AND DEBRIDEMENT KNEE WITH POLY EXCHANGE (Left) Advance diet Up with therapy Continue IV ABX, PICC line. I have consulted Dr. Orvan Falconer with infectious disease. I appreciate their input. Continue PT/OT Possible discharge home there is a or Friday, depending on the results of the cultures, and arranging the home antibiotics.  Haskel Khan 09/26/2011, 8:38 AM

## 2011-09-26 NOTE — Progress Notes (Signed)
Physical Therapy Evaluation Patient Details Name: [name] [name] MRN: [id number] DOB: 26-Aug-1941 Today's Date: 09/26/2011  Problem List:  Patient Active Problem List  Diagnoses  . DIABETES MELLITUS, TYPE II  . MIXED HYPERLIPIDEMIA  . ANXIETY  . SYNDROME, CHRONIC PAIN  . HYPERTENSION, UNSPECIFIED  . CORONARY ARTERY DISEASE  . PREMATURE VENTRICULAR CONTRACTIONS  . CEREBROVASCULAR DISEASE  . BRONCHITIS, ACUTE  . GASTROESOPHAGEAL REFLUX DISEASE  . DIVERTICULAR DISEASE  . IBS  . ACUTE CYSTITIS  . DEGENERATIVE JOINT DISEASE  . NECK PAIN, CHRONIC  . DIZZINESS, CHRONIC  . URINARY INCONTINENCE  . MITRAL VALVE PROLAPSE, HX OF  . Diarrhea  . Infection of prosthetic knee joint, left    Past Medical History:  Past Medical History  Diagnosis Date  . Acute bronchitis   . HTN (hypertension)   . CAD (coronary artery disease)   . Premature ventricular contractions   . Cerebrovascular disease, unspecified   . Mixed hyperlipidemia   . DM type 2 (diabetes mellitus, type 2)   . GERD (gastroesophageal reflux disease)   . Diverticular disease   . IBS (irritable bowel syndrome)   . Urinary incontinence   . DJD (degenerative joint disease)   . Chronic neck pain   . Chronic pain syndrome   . Dizziness   . Anxiety   . 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 Cholecystectomy   . Cervical spine surgery 07/2005    Dr Jaidan East  . Bladder repair   . Total abdominal hysterectomy   . Tonsillectomy   . Thumb surgery   . Neck surgery   . Temporomandibular joint surgery   . Left total knee replacement 11/2010    Dr Eulah Pont    PT Assessment/Plan/Recommendation PT Assessment Clinical Impression Statement: Patient s/p L knee revision presenting with decreased L knee ROM and strength however overall patient doing well secondary to having original L TKA done last march. Patient to benefit from skilled PT in hospital and  upon d/c to maximize L LE functional recovery. PT Recommendation/Assessment: Patient will need skilled PT in the acute care venue PT Problem List: Decreased strength;Decreased range of motion;Decreased activity tolerance;Decreased balance PT Therapy Diagnosis : Difficulty walking;Abnormality of gait;Acute pain;Generalized weakness PT Plan PT Frequency: 7X/week PT Treatment/Interventions: Gait training;Stair training;Functional mobility training;Therapeutic activities;Therapeutic exercise PT Recommendation Follow Up Recommendations: Home health PT Equipment Recommended:  (has DME from previous surgery) PT Goals  Acute Rehab PT Goals PT Goal Formulation: With patient Time For Goal Achievement: 7 days Pt will go Supine/Side to Sit: with modified independence PT Goal: Supine/Side to Sit - Progress: Goal set today Pt will go Sit to Stand: with modified independence PT Goal: Sit to Stand - Progress: Goal set today Pt will Ambulate: >150 feet;with modified independence;with rolling walker PT Goal: Ambulate - Progress: Goal set today Pt will Go Up / Down Stairs: 1-2 stairs;with supervision;with rolling walker (using backwards technique to enter home.) PT Goal: Up/Down Stairs - Progress: Goal set today Pt will Perform Home Exercise Program: Independently PT Goal: Perform Home Exercise Program - Progress: Goal set today  PT Evaluation Precautions/Restrictions  Precautions Required Braces or Orthoses: Yes Knee Immobilizer: Discontinue post op day 2 (L LE) Restrictions LLE Weight Bearing: Weight bearing as tolerated Prior Functioning  Home Living Lives With: Spouse Type of Home: House Home Layout: One level Home Access: Stairs to enter Entrance Stairs-Rails: None Entrance Stairs-Number of Steps: 2 Bathroom Shower/Tub:  Walk-in shower;Tub/shower unit Bathroom Toilet: Standard Bathroom Accessibility: Yes How Accessible: Accessible via walker Home Adaptive Equipment: Bedside  commode/3-in-1;Reacher;Sock aid;Walker - rolling;Shower chair with back Prior Function Level of Independence: Independent with basic ADLs;Independent with gait;Requires assistive device for independence Able to Take Stairs?: Yes Driving: Yes Vocation: Retired Financial risk analyst Arousal/Alertness: Awake/alert Overall Cognitive Status: Appears within functional limits for tasks assessed Orientation Level: Oriented X4 Sensation/Coordination Sensation Light Touch: Appears Intact Extremity Assessment RUE Assessment RUE Assessment: Within Functional Limits LUE Assessment LUE Assessment: Within Functional Limits RLE Assessment RLE Assessment: Within Functional Limits LLE Assessment LLE Assessment: Not tested (secondary to patient s/p L knee revision) Mobility (including Balance) Bed Mobility Bed Mobility: Yes Supine to Sit: 5: Supervision;HOB flat Transfers Transfers: Yes Sit to Stand: 4: Min assist;From bed (contact guard assist with L LE KI on) Sit to Stand Details (indicate cue type and reason): patient with safe technique and hand placement Ambulation/Gait Ambulation/Gait: Yes Ambulation/Gait Assistance: 4: Min assist (contact guard) Ambulation/Gait Assistance Details (indicate cue type and reason): KI on L LE Ambulation Distance (Feet): 80 Feet (limited by mild dizziness) Assistive device: Rolling walker Gait Pattern: Step-to pattern;Decreased step length - left;Decreased stance time - left Gait velocity: decreased cadence    Exercise  Total Joint Exercises Ankle Circles/Pumps: AROM;20 reps;Both;Supine Quad Sets: AROM;Left;10 reps;Supine Heel Slides: AAROM;Left;10 reps End of Session PT - End of Session Equipment Utilized During Treatment: Gait belt Activity Tolerance: Patient tolerated treatment well Patient left: in bed;with family/visitor present;with call bell in reach (with L KI on) Nurse Communication: Mobility status for transfers;Mobility status for ambulation  (instructed RN and patient it is safe for patient to amb with RN staff with RW and L KI on. General Behavior During Session: [facility] for tasks performed Cognition: [facility] for tasks performed  Marcene Brawn 09/26/2011, 8:28 AM  Lewis Shock, PT, DPT Pager #: [phone] Office #: [phone]

## 2011-09-26 NOTE — Progress Notes (Signed)
ANTICOAGULATION CONSULT NOTE - Follow Up Consult  Pharmacy Consult for Coumadin Indication: VTE prophylaxis  Allergies  Allergen Reactions  . Codeine     REACTION: nausea  . Prednisone     REACTION: rash  . Sulfonamide Derivatives     REACTION: rash  . Tetracycline     REACTION: rash    Patient Measurements: Height: 5\' 1"  (154.9 cm) Weight: 147 lb 11.3 oz (67 kg) IBW/kg (Calculated) : 47.8   Vital Signs: Temp: 98.8 F (37.1 C) (01/23 1256) BP: 106/63 mmHg (01/23 1256) Pulse Rate: 90  (01/23 1256)  Labs:  Basename 09/26/11 0520 09/25/11 1245  HGB 11.6* 13.0  HCT 35.2* 37.8  PLT 232 231  APTT -- 36  LABPROT 15.1 15.1  INR 1.17 1.17  HEPARINUNFRC -- --  CREATININE 1.13* 1.32*  CKTOTAL -- --  CKMB -- --  TROPONINI -- --   Estimated Creatinine Clearance: 40 ml/min (by C-G formula based on Cr of 1.13).   Medications:  Prescriptions prior to admission  Medication Sig Dispense Refill  . Aloe Vera 25 MG CAPS Take 1 capsule by mouth 2 (two) times daily.        . Ascorbic Acid (VITAMIN C) 1000 MG tablet Take 1,000 mg by mouth daily.        Marland Kitchen aspirin 81 MG tablet Take 81 mg by mouth daily.        . calcium carbonate (OS-CAL) 600 MG TABS Take 600 mg by mouth 2 (two) times daily with a meal.        . Cholecalciferol (VITAMIN D3) 1000 UNITS CAPS Take 1 capsule by mouth daily.        . Cinnamon 500 MG capsule Take 500 mg by mouth daily.        . clopidogrel (PLAVIX) 75 MG tablet Take 1 tablet by mouth Daily.      Marland Kitchen Cod Liver Oil 1000 MG CAPS Take 1 capsule by mouth 2 (two) times daily.        . Coenzyme Q10 (CO Q 10) 100 MG CAPS Take 1 capsule by mouth daily.        . diclofenac (VOLTAREN) 75 MG EC tablet Take 75 mg by mouth 2 (two) times daily. 1 tablet by mouth twice a day. Take with food      . dicyclomine (BENTYL) 10 MG capsule Take 10 mg by mouth 4 (four) times daily -  before meals and at bedtime.       . Flaxseed, Linseed, 1000 MG CAPS Take 3 capsules by mouth 2  (two) times daily.        . furosemide (LASIX) 20 MG tablet Take 20 mg by mouth daily.      . Garlic 1000 MG CAPS Take 2 capsules by mouth at bedtime.        Marland Kitchen glimepiride (AMARYL) 1 MG tablet Take 1 mg by mouth daily before breakfast.        . Glucosamine-Chondroit-Vit C-Mn (GLUCOSAMINE CHONDR 1500 COMPLX PO) Take 1 capsule by mouth 2 (two) times daily.        Marland Kitchen losartan (COZAAR) 100 MG tablet Take 100 mg by mouth daily.      . metFORMIN (GLUCOPHAGE) 500 MG tablet Take 500 mg by mouth 2 (two) times daily with a meal.        . metoprolol (LOPRESSOR) 100 MG tablet Take 100 mg by mouth 2 (two) times daily.      . Multiple Vitamin (STRESSTABS PO) Take 1  tablet by mouth at bedtime.        . niacin 500 MG tablet Take 1,000 mg by mouth at bedtime.       . Omega-3 Fatty Acids (FISH OIL) 1000 MG CAPS Take 1 capsule by mouth 4 (four) times daily.        Marland Kitchen omeprazole (PRILOSEC) 20 MG capsule Take 40 mg by mouth daily.      . pravastatin (PRAVACHOL) 40 MG tablet Take 40 mg by mouth every evening.      . pregabalin (LYRICA) 75 MG capsule Take 75 mg by mouth 2 (two) times daily.      . psyllium (REGULOID) 0.52 G capsule Take 0.52 g by mouth 2 (two) times daily.        . Red Yeast Rice 600 MG CAPS Take 1 capsule by mouth 2 (two) times daily.        Marland Kitchen trimethoprim (TRIMPEX) 100 MG tablet Take 1 tablet by mouth at bedtime.      Marland Kitchen venlafaxine (EFFEXOR) 37.5 MG tablet Take 37.5 mg by mouth daily.      Marland Kitchen DISCONTD: pravastatin (PRAVACHOL) 40 MG tablet Take 1 tablet (40 mg total) by mouth every evening.  30 tablet  11  . nitroGLYCERIN (NITROSTAT) 0.4 MG SL tablet Place 0.4 mg under the tongue every 5 (five) minutes as needed. For chest pain      . DISCONTD: diclofenac (VOLTAREN) 75 MG EC tablet 1 tablet by mouth twice a day. Take with food  60 tablet  prn  . DISCONTD: furosemide (LASIX) 20 MG tablet TAKE ONE TABLET BY MOUTH EVERY DAY  30 tablet  11  . DISCONTD: losartan (COZAAR) 100 MG tablet TAKE ONE TABLET BY  MOUTH EVERY DAY  30 tablet  5  . DISCONTD: LYRICA 75 MG capsule TAKE ONE CAPSULE BY MOUTH TWICE DAILY AS NEEDED  60 each  4  . DISCONTD: metoprolol (LOPRESSOR) 100 MG tablet TAKE ONE TABLET BY MOUTH TWICE DAILY  60 tablet  5  . DISCONTD: misoprostol (CYTOTEC) 200 MCG tablet Take 200 mcg by mouth 2 (two) times daily.        Marland Kitchen DISCONTD: nitroGLYCERIN (NITROSTAT) 0.4 MG SL tablet Place 1 tablet (0.4 mg total) under the tongue every 5 (five) minutes as needed. May repeat x 3  25 tablet  6  . DISCONTD: omeprazole (PRILOSEC) 20 MG capsule TAKE TWO CAPSULES BY MOUTH EVERY DAY  60 capsule  5  . DISCONTD: venlafaxine (EFFEXOR) 37.5 MG tablet TAKE ONE TABLET BY MOUTH EVERY DAY  30 tablet  5    Admit Complaint: Infected L knee Pharmacist System-Based Medication Review: Anticoagulation Coumadin for VTE px. NR subtherapeutic as expected s/p one dose of Coumadin. H/H decreased, Plts stable. No overt bleeding reported. Noted patient on concurrent ASA, Plavix. Home naproxen not resumed, but misoprostol is on board.  Infectious Disease Vanc,Zosyn#1. Doses ok. GNR UTI, speciation pending. Synovial fluid cx are ngtd.Afeb, WBC trending down 1/22 Vanc >>> 1/22 Zosyn >>>  Cardiovascular VSS, home meds resumed.  Endocrinology CBG control ok- pt on home metformin, glyburide  Gastrointestinal / Nutrition Clear liquid diet  Neurology Home effexor and pregabalin resumed  Nephrology Scr decr slightly, UOP good. K slightly high- on Losartan and trimethoprim. Pt also on 1/2NS with 20K at  32ml/min- will contact MD  Pulmonary 2L [place]  Hematology / Oncology WBC trending down. H/H decr slightly. Plts stable.  PTA Medication Issues Diclofenac d/c. Pt still on Cytotec  Best Practices LMWH,  Coumadin, PO PPI     Goal of Therapy:  INR 2-3   Plan:  1. Coumadin 5mg  PO x 1 today 2. F/up daily PT/INR 3 Per Dr.Landau, will hold losartan, and take KCL out of IV fluids.  Elchanan Bob K. Allena Katz, PharmD, BCPS.  Clinical  Pharmacist Pager [phone]. 09/26/2011 1:48 PM

## 2011-09-27 DIAGNOSIS — T8450XA Infection and inflammatory reaction due to unspecified internal joint prosthesis, initial encounter: Secondary | ICD-10-CM | POA: Diagnosis not present

## 2011-09-27 DIAGNOSIS — Y849 Medical procedure, unspecified as the cause of abnormal reaction of the patient, or of later complication, without mention of misadventure at the time of the procedure: Secondary | ICD-10-CM | POA: Diagnosis not present

## 2011-09-27 LAB — PROTIME-INR
INR: 1.4 (ref 0.00–1.49)
Prothrombin Time: 17.4 seconds — ABNORMAL HIGH (ref 11.6–15.2)

## 2011-09-27 LAB — CBC
Hemoglobin: 9.9 g/dL — ABNORMAL LOW (ref 12.0–15.0)
MCH: 31.6 pg (ref 26.0–34.0)
MCHC: 32.9 g/dL (ref 30.0–36.0)
Platelets: 209 10*3/uL (ref 150–400)
RBC: 3.13 MIL/uL — ABNORMAL LOW (ref 3.87–5.11)

## 2011-09-27 LAB — GLUCOSE, CAPILLARY: Glucose-Capillary: 105 mg/dL — ABNORMAL HIGH (ref 70–99)

## 2011-09-27 MED ORDER — NIACIN ER (ANTIHYPERLIPIDEMIC) 500 MG PO TBCR
1000.0000 mg | EXTENDED_RELEASE_TABLET | Freq: Every day | ORAL | Status: DC
  Administered 2011-09-27: 1000 mg via ORAL
  Filled 2011-09-27 (×2): qty 2

## 2011-09-27 MED ORDER — COUMADIN BOOK
Freq: Once | Status: AC
  Administered 2011-09-27: 1
  Filled 2011-09-27: qty 1

## 2011-09-27 MED ORDER — LEVOFLOXACIN 500 MG PO TABS
500.0000 mg | ORAL_TABLET | Freq: Once | ORAL | Status: AC
Start: 2011-09-27 — End: 2011-09-27
  Administered 2011-09-27: 500 mg via ORAL
  Filled 2011-09-27: qty 1

## 2011-09-27 MED ORDER — WARFARIN SODIUM 2.5 MG PO TABS
2.5000 mg | ORAL_TABLET | Freq: Once | ORAL | Status: AC
  Administered 2011-09-27: 2.5 mg via ORAL
  Filled 2011-09-27: qty 1

## 2011-09-27 MED ORDER — LEVOFLOXACIN 250 MG PO TABS
250.0000 mg | ORAL_TABLET | Freq: Every day | ORAL | Status: DC
  Administered 2011-09-28: 250 mg via ORAL
  Filled 2011-09-27 (×2): qty 1

## 2011-09-27 NOTE — Clinical Documentation Improvement (Signed)
Abnormal Labs Clarification  THIS DOCUMENT IS NOT A PERMANENT PART OF THE MEDICAL RECORD  TO RESPOND TO THE THIS QUERY, FOLLOW THE INSTRUCTIONS BELOW:  1. If needed, update documentation for the patient's encounter via the notes activity.  2. Access this query again and click edit on the [employer].  3. After updating, or not, click F2 to complete all highlighted (required) fields concerning your review. Select "additional documentation in the medical record" OR "no additional documentation provided".  4. Click Sign note button.  5. The deficiency will fall out of your InBasket *Please let us know if you are not able to complete this workflow by phone or e-mail (listed below).  Please update your documentation within the medical record to reflect your response to this query.                                                                                   09/27/11  Dear Dr.  Malva Cogan Marton Redwood  In a better effort to capture your patient's severity of illness, reflect appropriate length of stay and utilization of resources, a review of the medical record has revealed the following indicators.    Based on your clinical judgment, please clarify and document in a progress note and/or discharge summary the clinical condition associated with the following supporting information:  In responding to this query please exercise your independent judgment.  The fact that a query is asked, does not imply that any particular answer is desired or expected.  Abnormal findings (laboratory, x-ray, pathologic, and other diagnostic results) are not coded and reported unless the physician indicates their clinical significance.   The medical record reflects the following clinical findings, please clarify the diagnostic and/or clinical significance:       Please clarify secondary diagnosis.  Per documentation urine culture  grew > 100, 000 colonies of K . Pneumoniae on 1/22 .   Thank you  Possible  Clinical Conditions?                                 __________UTI                            _______________Other Condition  __________Bacturia                    _______________Cannot Clinically Determine                                 Supporting Information:  Lab Test: Specimen Description  URINE, CLEAN CATCH  Final  Special Requests  NONE  Final  Setup Time  [id number]  Final  Colony Count  >=100,000 COLONIES/ML  Final  Culture  KLEBSIELLA PNEUMONIAE  Final  Report Status  09/27/2011 FINAL  Final  Organism ID, Bacteria  KLEBSIELLA PNEUMONIAE  Final   Treatment: IV beta lactam per the sensitivities or IV/PO fluoroquinolone for 4-6 weeks, started on Cipro     Reviewed: additional documentation in the medical record entered into DC summary for  the klebsiella UTI.   Thank You,  Lavonda Jumbo  Clinical Documentation Specialist RN, BSN, CDS:  Pager [phone]  [facility] [employer] [facility] [place]

## 2011-09-27 NOTE — Progress Notes (Signed)
Subjective: 2 Days Post-Op Procedure(s) (LRB): IRRIGATION AND DEBRIDEMENT KNEE WITH POLY EXCHANGE (Left) Patient reports pain as moderate.   Patient states that she is in some more pain right now.   She is due for Pain meds. Objective: Vital signs in last 24 hours: Temp:  [98.5 F (36.9 C)-100.4 F (38 C)] 100.4 F (38 C) (01/24 0552) Pulse Rate:  [88-92] 92  (01/24 0552) Resp:  [18] 18  (01/24 0552) BP: (104-124)/(45-63) 124/52 mmHg (01/24 0552) SpO2:  [93 %-96 %] 93 % (01/24 0552)  Intake/Output from previous day: 01/23 0701 - 01/24 0700 In: 720 [P.O.:360; I.V.:360] Out: 450 [Urine:300; Drains:150] Intake/Output this shift:     Basename 09/27/11 0500 09/26/11 0520 09/25/11 1245  HGB 9.9* 11.6* 13.0    Basename 09/27/11 0500 09/26/11 0520  WBC 8.5 11.9*  RBC 3.13* 3.55*  HCT 30.1* 35.2*  PLT 209 232    Basename 09/26/11 0520 09/25/11 1245  NA 130* 133*  K 5.2* 4.5  CL 95* 96  CO2 28 28  BUN 22 34*  CREATININE 1.13* 1.32*  GLUCOSE 110* 107*  CALCIUM 8.1* 8.8    Basename 09/27/11 0500 09/26/11 0520  LABPT -- --  INR 1.40 1.17    Sensation intact distally Dorsiflexion/Plantar flexion intact Incision: dressing C/D/I and no drainage hemovac drain in place and active   Assessment/Plan: 2 Days Post-Op Procedure(s) (LRB): IRRIGATION AND DEBRIDEMENT KNEE WITH POLY EXCHANGE (Left) Advance diet Up with therapy Plan for discharge tomorrow w abx per ID. DC'd hemovac Drain DOUGLAS Janace Litten 09/27/2011, 7:37 AM

## 2011-09-27 NOTE — Progress Notes (Addendum)
INFECTIOUS DISEASE PROGRESS NOTE    Date of Admission:  09/25/2011     Antibiotics:  Ancef 1/22 -->1/22 Zosyn 1/22 --> Vanc 1/22 --> 1/23  Problem List:  1. 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 aspirin EC  81 mg Oral Daily  . calcium-vitamin D  1 tablet Oral BID WC  . clopidogrel  75 mg Oral Q breakfast  . dicyclomine  10 mg Oral TID AC & HS  . enoxaparin  30 mg Subcutaneous Q12H  . furosemide  20 mg Oral Daily  . glimepiride  1 mg Oral QAC breakfast  . metFORMIN  500 mg Oral BID WC  . methocarbamol(ROBAXIN) IV  500 mg Intravenous To PACU  . metoprolol tartrate  100 mg Oral Once  . metoprolol  100 mg Oral BID  . misoprostol  200 mcg Oral BID  . niacin  1,000 mg Oral QHS  . pantoprazole  40 mg Oral Q1200  . patient's guide to using coumadin book   Does not apply Once  . piperacillin-tazobactam (ZOSYN)  IV  3.375 g Intravenous Q8H  . pregabalin  75 mg Oral BID  . psyllium  1 packet Oral BID  . simvastatin  20 mg Oral q1800  . trimethoprim  100 mg Oral QHS  . venlafaxine  37.5 mg Oral Daily  . vitamin C  1,000 mg Oral Daily  . warfarin  2.5 mg Oral ONCE-1800  . warfarin  5 mg Oral ONCE-1800  . warfarin   Does not apply Once  . DISCONTD: niacin  1,000 mg Oral QHS  . DISCONTD: vancomycin  1,000 mg Intravenous Q24H   Subjective: Pain mildly worse then yesterday but well controlled with the pain medication.  States she has been up to the chair for a while today.  Denies nausea, vomiting, or diarrhea.  She had a mild fever this AM to 100.4 and states she has some mild flushing and then sweats but denies prolonged feeling of fever or chills.    Objective: Temp:  [98.5 F (36.9 C)-100.4 F (38 C)] 100.4 F (38 C) (01/24 0552) Pulse Rate:  [88-92] 92  (01/24 0552) Resp:  [18] 18  (01/24 0552) BP: (104-124)/(45-52) 110/50 mmHg (01/24 1041) SpO2:  [93 %-96 %] 93 % (01/24 0552)  Vitals reviewed. General: resting in bed, NAD HEENT: PERRL, EOMI, no  scleral icterus Cardiac: RRR, no rubs, murmurs or gallops Pulm: clear to auscultation bilaterally, no wheezes, rales, or rhonchi Abd: soft, nontender, nondistended, BS present Ext: Left knee remains in the immobilizer.  Ace wrap in place with no shadowing.  Mild tenderness to palpation of the leg. warm and well perfused, no pedal edema Neuro: alert and oriented X3, cranial nerves II-XII grossly intact, strength and sensation to light touch equal in bilateral upper and lower extremities  Lab Results Lab Results  Component Value Date   WBC 8.5 09/27/2011   HGB 9.9* 09/27/2011   HCT 30.1* 09/27/2011   MCV 96.2 09/27/2011   PLT 209 09/27/2011    Lab Results  Component Value Date   CREATININE 1.13* 09/26/2011   BUN 22 09/26/2011   NA 130* 09/26/2011   K 5.2* 09/26/2011   CL 95* 09/26/2011   CO2 28 09/26/2011    Lab Results  Component Value Date   ALT 30 09/25/2011   AST 16 09/25/2011   ALKPHOS 84 09/25/2011   BILITOT 0.4 09/25/2011   Microbiology: Recent Results (from the past 240 hour(s))  URINE  CULTURE     Status: Normal   Collection Time   09/25/11 12:55 PM      Component Value Range Status Comment   Specimen Description URINE, CLEAN CATCH   Final    Special Requests NONE   Final    Setup Time [id number]   Final    Colony Count >=100,000 COLONIES/ML   Final    Culture KLEBSIELLA PNEUMONIAE   Final    Report Status 09/27/2011 FINAL   Final    Organism ID, Bacteria KLEBSIELLA PNEUMONIAE   Final   BODY FLUID CULTURE     Status: Normal (Preliminary result)   Collection Time   09/25/11  4:04 PM      Component Value Range Status Comment   Specimen Description SYNOVIAL KNEE LEFT   Final    Special Requests NONE   Final    Gram Stain     Final    Value: ABUNDANT WBC PRESENT,BOTH PMN AND MONONUCLEAR     NO ORGANISMS SEEN   Culture FEW GRAM NEGATIVE RODS   Final    Report Status PENDING   Incomplete   ANAEROBIC CULTURE     Status: Normal (Preliminary result)   Collection Time   09/25/11   4:04 PM      Component Value Range Status Comment   Specimen Description SYNOVIAL KNEE LEFT   Final    Special Requests NONE   Final    Gram Stain     Final    Value: ABUNDANT WBC PRESENT,BOTH PMN AND MONONUCLEAR     NO SQUAMOUS EPITHELIAL CELLS SEEN     NO ORGANISMS SEEN   Culture     Final    Value: NO ANAEROBES ISOLATED; CULTURE IN PROGRESS FOR 5 DAYS   Report Status PENDING   Incomplete   TISSUE CULTURE     Status: Normal (Preliminary result)   Collection Time   09/25/11  4:08 PM      Component Value Range Status Comment   Specimen Description TISSUE KNEE LEFT   Final    Special Requests NONE   Final    Gram Stain     Final    Value: MODERATE WBC PRESENT,BOTH PMN AND MONONUCLEAR     NO SQUAMOUS EPITHELIAL CELLS SEEN     NO ORGANISMS SEEN   Culture FEW GRAM NEGATIVE RODS   Final    Report Status PENDING   Incomplete   ANAEROBIC CULTURE     Status: Normal (Preliminary result)   Collection Time   09/25/11  4:08 PM      Component Value Range Status Comment   Specimen Description TISSUE KNEE LEFT   Final    Special Requests NONE   Final    Gram Stain     Final    Value: MODERATE WBC PRESENT,BOTH PMN AND MONONUCLEAR     NO SQUAMOUS EPITHELIAL CELLS SEEN     NO ORGANISMS SEEN   Culture     Final    Value: NO ANAEROBES ISOLATED; CULTURE IN PROGRESS FOR 5 DAYS   Report Status PENDING   Incomplete    Synovial fluid culture from 1/21. K. Pneumoniae resistant to Ampicillin and Bactrim but sensitive to cipro and all cephalosporins.    Studies/Results: No results found.  Assessment: 1. 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 She was started on Vanc and Zosyn. Synovial fluid culture from the original tap on 1/21  was positive for K. Pneumoniae that was sensitive to cipro and all cephalosporins but resistant to Bactrim and Ampicillin. Per the IDSA guidelines treatment for  these type of infections include treatment with an IV beta lactam per the sensitivities or IV/PO fluoroquinolone for 4-6 weeks. Given that this was caught early and based on the sensitivities we would suggest a 6 week course of PO Levaquin 500 mg today then 250 mg daily for the remainder of the course.  We would also suggest a follow up with Dr. Orvan Falconer at the [facility] in about 4 weeks time.    Plan: 1. D/C Zosyn 2. Start Levaquin 500 mg PO today then 250 mg PO daily for a total of 6 weeks of therapy.  3. Follow up with Dr. Orvan Falconer in the [facility] in 4-5 weeks.   PRIBULA,CHRISTOPHER 09/27/2011 2:05 PM   Attending addendum: I agree with treating [name] [name] Klebsiella prosthetic knee infection with levofloxacin. The quinolone antibiotics have excellent bioavailability and can be given orally with essentially the same effectiveness as IV. Therefore I would recommend removal of [name] [name] PICC line and a minimum of 6 weeks of oral levofloxacin. I will arrange followup with [name] [name] in my clinic to monitor [name] [name] inflammatory markers and [name] [name] clinical progress in order to determine optimal duration of total antibiotic therapy.  Cliffton Asters, MD [facility] [phone] pager   [phone] cell 09/27/2011, 5:21 PM

## 2011-09-27 NOTE — Progress Notes (Signed)
ANTICOAGULATION CONSULT NOTE - Follow Up Consult  Pharmacy Consult for Coumadin Indication: VTE prophylaxis  Allergies  Allergen Reactions  . Codeine     REACTION: nausea  . Prednisone     REACTION: rash  . Sulfonamide Derivatives     REACTION: rash  . Tetracycline     REACTION: rash    Patient Measurements: Height: 5\' 1"  (154.9 cm) Weight: 147 lb 11.3 oz (67 kg) IBW/kg (Calculated) : 47.8   Vital Signs: Temp: 100.4 F (38 C) (01/24 0552) BP: 124/52 mmHg (01/24 0552) Pulse Rate: 92  (01/24 0552)  Labs:  Basename 09/27/11 0500 09/26/11 0520 09/25/11 1245  HGB 9.9* 11.6* --  HCT 30.1* 35.2* 37.8  PLT 209 232 231  APTT -- -- 36  LABPROT 17.4* 15.1 15.1  INR 1.40 1.17 1.17  HEPARINUNFRC -- -- --  CREATININE -- 1.13* 1.32*  CKTOTAL -- -- --  CKMB -- -- --  TROPONINI -- -- --   Estimated Creatinine Clearance: 40 ml/min (by C-G formula based on Cr of 1.13).   Medications:  Prescriptions prior to admission  Medication Sig Dispense Refill  . Aloe Vera 25 MG CAPS Take 1 capsule by mouth 2 (two) times daily.        . Ascorbic Acid (VITAMIN C) 1000 MG tablet Take 1,000 mg by mouth daily.        Marland Kitchen aspirin 81 MG tablet Take 81 mg by mouth daily.        . calcium carbonate (OS-CAL) 600 MG TABS Take 600 mg by mouth 2 (two) times daily with a meal.        . Cholecalciferol (VITAMIN D3) 1000 UNITS CAPS Take 1 capsule by mouth daily.        . Cinnamon 500 MG capsule Take 500 mg by mouth daily.        . clopidogrel (PLAVIX) 75 MG tablet Take 1 tablet by mouth Daily.      Marland Kitchen Cod Liver Oil 1000 MG CAPS Take 1 capsule by mouth 2 (two) times daily.        . Coenzyme Q10 (CO Q 10) 100 MG CAPS Take 1 capsule by mouth daily.        . diclofenac (VOLTAREN) 75 MG EC tablet Take 75 mg by mouth 2 (two) times daily. 1 tablet by mouth twice a day. Take with food      . dicyclomine (BENTYL) 10 MG capsule Take 10 mg by mouth 4 (four) times daily -  before meals and at bedtime.       .  Flaxseed, Linseed, 1000 MG CAPS Take 3 capsules by mouth 2 (two) times daily.        . furosemide (LASIX) 20 MG tablet Take 20 mg by mouth daily.      . Garlic 1000 MG CAPS Take 2 capsules by mouth at bedtime.        Marland Kitchen glimepiride (AMARYL) 1 MG tablet Take 1 mg by mouth daily before breakfast.        . Glucosamine-Chondroit-Vit C-Mn (GLUCOSAMINE CHONDR 1500 COMPLX PO) Take 1 capsule by mouth 2 (two) times daily.        Marland Kitchen losartan (COZAAR) 100 MG tablet Take 100 mg by mouth daily.      . metFORMIN (GLUCOPHAGE) 500 MG tablet Take 500 mg by mouth 2 (two) times daily with a meal.        . metoprolol (LOPRESSOR) 100 MG tablet Take 100 mg by mouth 2 (two) times  daily.      . Multiple Vitamin (STRESSTABS PO) Take 1 tablet by mouth at bedtime.        . niacin (NIASPAN) 500 MG CR tablet Take 1,000 mg by mouth at bedtime.      . Omega-3 Fatty Acids (FISH OIL) 1000 MG CAPS Take 1 capsule by mouth 4 (four) times daily.        Marland Kitchen omeprazole (PRILOSEC) 20 MG capsule Take 40 mg by mouth daily.      . pravastatin (PRAVACHOL) 40 MG tablet Take 40 mg by mouth every evening.      . pregabalin (LYRICA) 75 MG capsule Take 75 mg by mouth 2 (two) times daily.      . psyllium (REGULOID) 0.52 G capsule Take 0.52 g by mouth 2 (two) times daily.        . Red Yeast Rice 600 MG CAPS Take 1 capsule by mouth 2 (two) times daily.        Marland Kitchen trimethoprim (TRIMPEX) 100 MG tablet Take 1 tablet by mouth at bedtime.      Marland Kitchen venlafaxine (EFFEXOR) 37.5 MG tablet Take 37.5 mg by mouth daily.      Marland Kitchen DISCONTD: pravastatin (PRAVACHOL) 40 MG tablet Take 1 tablet (40 mg total) by mouth every evening.  30 tablet  11  . nitroGLYCERIN (NITROSTAT) 0.4 MG SL tablet Place 0.4 mg under the tongue every 5 (five) minutes as needed. For chest pain      . DISCONTD: diclofenac (VOLTAREN) 75 MG EC tablet 1 tablet by mouth twice a day. Take with food  60 tablet  prn  . DISCONTD: furosemide (LASIX) 20 MG tablet TAKE ONE TABLET BY MOUTH EVERY DAY  30 tablet   11  . DISCONTD: losartan (COZAAR) 100 MG tablet TAKE ONE TABLET BY MOUTH EVERY DAY  30 tablet  5  . DISCONTD: LYRICA 75 MG capsule TAKE ONE CAPSULE BY MOUTH TWICE DAILY AS NEEDED  60 each  4  . DISCONTD: metoprolol (LOPRESSOR) 100 MG tablet TAKE ONE TABLET BY MOUTH TWICE DAILY  60 tablet  5  . DISCONTD: misoprostol (CYTOTEC) 200 MCG tablet Take 200 mcg by mouth 2 (two) times daily.        Marland Kitchen DISCONTD: nitroGLYCERIN (NITROSTAT) 0.4 MG SL tablet Place 1 tablet (0.4 mg total) under the tongue every 5 (five) minutes as needed. May repeat x 3  25 tablet  6  . DISCONTD: omeprazole (PRILOSEC) 20 MG capsule TAKE TWO CAPSULES BY MOUTH EVERY DAY  60 capsule  5  . DISCONTD: venlafaxine (EFFEXOR) 37.5 MG tablet TAKE ONE TABLET BY MOUTH EVERY DAY  30 tablet  5   Medications:  Scheduled:    . aspirin EC  81 mg Oral Daily  . calcium-vitamin D  1 tablet Oral BID WC  . clopidogrel  75 mg Oral Q breakfast  . dicyclomine  10 mg Oral TID AC & HS  . enoxaparin  30 mg Subcutaneous Q12H  . furosemide  20 mg Oral Daily  . glimepiride  1 mg Oral QAC breakfast  . metFORMIN  500 mg Oral BID WC  . methocarbamol(ROBAXIN) IV  500 mg Intravenous To PACU  . metoprolol tartrate  100 mg Oral Once  . metoprolol  100 mg Oral BID  . misoprostol  200 mcg Oral BID  . niacin  1,000 mg Oral QHS  . pantoprazole  40 mg Oral Q1200  . patient's guide to using coumadin book   Does not apply Once  .  piperacillin-tazobactam (ZOSYN)  IV  3.375 g Intravenous Q8H  . pregabalin  75 mg Oral BID  . psyllium  1 packet Oral BID  . simvastatin  20 mg Oral q1800  . trimethoprim  100 mg Oral QHS  . venlafaxine  37.5 mg Oral Daily  . vitamin C  1,000 mg Oral Daily  . warfarin  5 mg Oral ONCE-1800  . warfarin   Does not apply Once  . DISCONTD: losartan  100 mg Oral Daily  . DISCONTD: niacin  1,000 mg Oral QHS  . DISCONTD: vancomycin  1,000 mg Intravenous Q24H     Assessment: 71 yo female on Coumadin for VTE prophylaxis s/p left knee  arthroplasty for irrigation, debridement and revision of total knee. POD #2. INR 1.4 today from 1.17.  H/H decreased some but no bleeding reported. Lovenox SQ continues until INR =/> 1.8.    Goal of Therapy:  INR 2-3   Plan:  1. Coumadin 2.5mg  PO x 1 today 2. F/up daily PT/INR  Arman Filter 09/27/2011,9:45 AM

## 2011-09-27 NOTE — Progress Notes (Signed)
Physical Therapy Treatment Patient Details Name: [name] [name] MRN: [id number] DOB: 10-28-1940 Today's Date: 09/27/2011  PT Assessment/Plan  PT - Assessment/Plan Comments on Treatment Session: Pt moving well & progressing well with PT goals.  No physical (A) needed for any aspects of mobility in today's session.   PT Plan: Discharge plan remains appropriate PT Frequency: 7X/week Follow Up Recommendations: Home health PT Equipment Recommended: None recommended by PT (Has DME from previous surgery. ) PT Goals  Acute Rehab PT Goals PT Goal: Supine/Side to Sit - Progress: Met PT Goal: Sit to Stand - Progress: Progressing toward goal PT Goal: Ambulate - Progress: Progressing toward goal PT Goal: Perform Home Exercise Program - Progress: Progressing toward goal  PT Treatment Precautions/Restrictions  Precautions Precautions: Knee Required Braces or Orthoses: Yes Knee Immobilizer: Discontinue post op day 2 Restrictions Weight Bearing Restrictions: Yes (LLE) LLE Weight Bearing: Weight bearing as tolerated Mobility (including Balance) Bed Mobility Supine to Sit: 6: Modified independent (Device/Increase time);HOB flat Transfers Sit to Stand: 5: Supervision;From bed;From toilet;With armrests;With upper extremity assist;From chair/3-in-1 Sit to Stand Details (indicate cue type and reason): Cues for hand placement.  Stand to Sit: 6: Modified independent (Device/Increase time);With armrests;With upper extremity assist;To toilet;To chair/3-in-1 Ambulation/Gait Ambulation/Gait Assistance: 5: Supervision Ambulation/Gait Assistance Details (indicate cue type and reason): (S) for safety.  Ambulated without KI.  No buckling noted.  Cues to stay inside RW & to not get in a hurry.   Ambulation Distance (Feet): 120 Feet Assistive device: Rolling walker Gait Pattern: Step-through pattern;Antalgic;Decreased stance time - left;Decreased step length - right Stairs: No Wheelchair Mobility Wheelchair  Mobility: No    Exercise  Total Joint Exercises Ankle Circles/Pumps: AROM;10 reps;Both Quad Sets: AROM;Strengthening;10 reps;Both;Supine Gluteal Sets: AROM;Strengthening;Both;10 reps;Supine Straight Leg Raises: AROM;Strengthening;Left;10 reps;Supine Long Arc Quad: AROM;Strengthening;Left;10 reps;Seated End of Session PT - End of Session Equipment Utilized During Treatment: Gait belt Activity Tolerance: Patient tolerated treatment well Patient left: in chair;with call bell in reach;with family/visitor present General Behavior During Session: [facility] for tasks performed Cognition: [facility] for tasks performed  Lara Mulch 09/27/2011, 2:43 PM [phone]

## 2011-09-28 DIAGNOSIS — T8450XA Infection and inflammatory reaction due to unspecified internal joint prosthesis, initial encounter: Secondary | ICD-10-CM

## 2011-09-28 DIAGNOSIS — Y831 Surgical operation with implant of artificial internal device as the cause of abnormal reaction of the patient, or of later complication, without mention of misadventure at the time of the procedure: Secondary | ICD-10-CM

## 2011-09-28 LAB — PROTIME-INR: INR: 1.54 — ABNORMAL HIGH (ref 0.00–1.49)

## 2011-09-28 LAB — GLUCOSE, CAPILLARY
Glucose-Capillary: 120 mg/dL — ABNORMAL HIGH (ref 70–99)
Glucose-Capillary: 107 mg/dL — ABNORMAL HIGH (ref 70–99)

## 2011-09-28 LAB — CBC
HCT: 27.7 % — ABNORMAL LOW (ref 36.0–46.0)
Hemoglobin: 9.3 g/dL — ABNORMAL LOW (ref 12.0–15.0)
MCV: 93.9 fL (ref 78.0–100.0)
WBC: 11.2 10*3/uL — ABNORMAL HIGH (ref 4.0–10.5)

## 2011-09-28 LAB — BODY FLUID CULTURE

## 2011-09-28 LAB — TISSUE CULTURE

## 2011-09-28 MED ORDER — WARFARIN SODIUM 5 MG PO TABS: 5.0000 mg | ORAL_TABLET | Freq: Every day | ORAL | Status: DC

## 2011-09-28 MED ORDER — OXYCODONE-ACETAMINOPHEN 10-325 MG PO TABS: 1.0000 | ORAL_TABLET | Freq: Four times a day (QID) | ORAL | Status: AC | PRN

## 2011-09-28 MED ORDER — LEVOFLOXACIN 500 MG PO TABS: 500.0000 mg | ORAL_TABLET | Freq: Every day | ORAL | Status: AC

## 2011-09-28 MED ORDER — ENOXAPARIN SODIUM 40 MG/0.4ML ~~LOC~~ SOLN: 40.0000 mg | SUBCUTANEOUS | Status: DC

## 2011-09-28 MED ORDER — METHOCARBAMOL 500 MG PO TABS: 500.0000 mg | ORAL_TABLET | Freq: Four times a day (QID) | ORAL | Status: AC

## 2011-09-28 MED ORDER — LEVOFLOXACIN 500 MG PO TABS: 500.0000 mg | ORAL_TABLET | Freq: Every day | ORAL | Status: DC

## 2011-09-28 MED ORDER — WARFARIN SODIUM 2.5 MG PO TABS
2.5000 mg | ORAL_TABLET | ORAL | Status: AC
  Administered 2011-09-28: 2.5 mg via ORAL
  Filled 2011-09-28: qty 1

## 2011-09-28 NOTE — Discharge Summary (Addendum)
Physician Discharge Summary  Patient ID: [name] [name] MRN: [id number] DOB/AGE: 06-Mar-1941 71 y.o.  Admit date: 09/25/2011 Discharge date: 09/28/2011  Admission Diagnoses:  Infection of prosthetic knee joint  Discharge Diagnoses:  Principal Problem:  *Infection of prosthetic knee joint, left Klebsiella UTI  Past Medical History  Diagnosis Date  . Acute bronchitis   . HTN (hypertension)   . CAD (coronary artery disease)   . Premature ventricular contractions   . Cerebrovascular disease, unspecified   . Mixed hyperlipidemia   . DM type 2 (diabetes mellitus, type 2)   . GERD (gastroesophageal reflux disease)   . Diverticular disease   . IBS (irritable bowel syndrome)   . Urinary incontinence   . DJD (degenerative joint disease)   . Chronic neck pain   . Chronic pain syndrome   . Dizziness   . Anxiety   . Infection of prosthetic knee joint, left 09/25/2011    Surgeries: Procedure(s): IRRIGATION AND DEBRIDEMENT KNEE WITH POLY EXCHANGE on 09/25/2011   Consultants (if any):    Discharged Condition: Improved  Hospital Course: [name] [name] is an 71 y.o. female who was admitted 09/25/2011 with a diagnosis of Infection of prosthetic knee joint and went to the operating room on 09/25/2011 and underwent the above named procedures.    She was given perioperative antibiotics:  Anti-infectives     Start     Dose/Rate Route Frequency Ordered Stop   09/28/11 1000   levofloxacin (LEVAQUIN) tablet 500 mg        500 mg Oral Daily 09/28/11 0922     09/28/11 0800   levofloxacin (LEVAQUIN) tablet 250 mg        250 mg Oral Daily with breakfast 09/27/11 1617     09/28/11 0000   levofloxacin (LEVAQUIN) 500 MG tablet        500 mg Oral Daily 09/28/11 0927 10/08/11 2359   09/27/11 1700   levofloxacin (LEVAQUIN) tablet 500 mg        500 mg Oral  Once 09/27/11 1617 09/27/11 1734   09/25/11 2200   trimethoprim (TRIMPEX) tablet 100 mg        100 mg Oral Daily at bedtime 09/25/11 1852       09/25/11 2200   piperacillin-tazobactam (ZOSYN) IVPB 3.375 g  Status:  Discontinued        3.375 g 12.5 mL/hr over 240 Minutes Intravenous 3 times per day 09/25/11 1937 09/27/11 1617   09/25/11 2000   vancomycin (VANCOCIN) IVPB 1000 mg/200 mL premix  Status:  Discontinued        1,000 mg 200 mL/hr over 60 Minutes Intravenous Every 24 hours 09/25/11 1937 09/26/11 1756   09/25/11 1300   ceFAZolin (ANCEF) IVPB 2 g/50 mL premix        2 g 100 mL/hr over 30 Minutes Intravenous 60 min pre-op 09/25/11 1209 09/25/11 1520        .  She was given sequential compression devices, early ambulation, and chemoprophylaxis for DVT prophylaxis.  She benefited maximally from their hospital stay and there were no complications.    Recent vital signs:  Filed Vitals:   09/28/11 0619  BP: 138/44  Pulse: 114  Temp: 100.9 F (38.3 C)  Resp: 20    Recent laboratory studies:  Lab Results  Component Value Date   HGB 9.3* 09/28/2011   HGB 9.9* 09/27/2011   HGB 11.6* 09/26/2011   Lab Results  Component Value Date   WBC 11.2* 09/28/2011   PLT  222 09/28/2011   Lab Results  Component Value Date   INR 1.54* 09/28/2011   Lab Results  Component Value Date   NA 130* 09/26/2011   K 5.2* 09/26/2011   CL 95* 09/26/2011   CO2 28 09/26/2011   BUN 22 09/26/2011   CREATININE 1.13* 09/26/2011   GLUCOSE 110* 09/26/2011   Microbiology:  Recent Results (from the past 240 hour(s))   URINE CULTURE Status: Normal    Collection Time    09/25/11 12:55 PM   Component  Value  Range  Status  Comment    Specimen Description  URINE, CLEAN CATCH   Final     Special Requests  NONE   Final     Setup Time  [id number]   Final     Colony Count  >=100,000 COLONIES/ML   Final     Culture  KLEBSIELLA PNEUMONIAE   Final     Report Status  09/27/2011 FINAL   Final     Organism ID, Bacteria  KLEBSIELLA PNEUMONIAE   Final    BODY FLUID CULTURE Status: Normal (Preliminary result)    Collection Time    09/25/11 4:04 PM    Component  Value  Range  Status  Comment    Specimen Description  SYNOVIAL KNEE LEFT   Final     Special Requests  NONE   Final     Gram Stain    Final     Value:  ABUNDANT WBC PRESENT,BOTH PMN AND MONONUCLEAR     NO ORGANISMS SEEN    Culture  FEW GRAM NEGATIVE RODS   Final     Report Status  PENDING   Incomplete    ANAEROBIC CULTURE Status: Normal (Preliminary result)    Collection Time    09/25/11 4:04 PM   Component  Value  Range  Status  Comment    Specimen Description  SYNOVIAL KNEE LEFT   Final     Special Requests  NONE   Final     Gram Stain    Final     Value:  ABUNDANT WBC PRESENT,BOTH PMN AND MONONUCLEAR     NO SQUAMOUS EPITHELIAL CELLS SEEN     NO ORGANISMS SEEN    Culture    Final     Value:  NO ANAEROBES ISOLATED; CULTURE IN PROGRESS FOR 5 DAYS    Report Status  PENDING   Incomplete    TISSUE CULTURE Status: Normal (Preliminary result)    Collection Time    09/25/11 4:08 PM   Component  Value  Range  Status  Comment    Specimen Description  TISSUE KNEE LEFT   Final     Special Requests  NONE   Final     Gram Stain    Final     Value:  MODERATE WBC PRESENT,BOTH PMN AND MONONUCLEAR     NO SQUAMOUS EPITHELIAL CELLS SEEN     NO ORGANISMS SEEN    Culture  FEW GRAM NEGATIVE RODS   Final     Report Status  PENDING   Incomplete    ANAEROBIC CULTURE Status: Normal (Preliminary result)    Collection Time    09/25/11 4:08 PM   Component  Value  Range  Status  Comment    Specimen Description  TISSUE KNEE LEFT   Final     Special Requests  NONE   Final     Gram Stain    Final     Value:  MODERATE WBC PRESENT,BOTH  PMN AND MONONUCLEAR     NO SQUAMOUS EPITHELIAL CELLS SEEN     NO ORGANISMS SEEN    Culture    Final     Value:  NO ANAEROBES ISOLATED; CULTURE IN PROGRESS FOR 5 DAYS    Report Status  PENDING   Incomplete    Synovial fluid culture from 1/21.  K. Pneumoniae resistant to Ampicillin and Bactrim but sensitive to cipro and all cephalosporins.   Discharge  Medications:   Medication List  As of 09/28/2011  9:27 AM   TAKE these medications         Aloe Vera 25 MG Caps   Take 1 capsule by mouth 2 (two) times daily.      aspirin 81 MG tablet   Take 81 mg by mouth daily.      calcium carbonate 600 MG Tabs   Commonly known as: OS-CAL   Take 600 mg by mouth 2 (two) times daily with a meal.      Cinnamon 500 MG capsule   Take 500 mg by mouth daily.      clopidogrel 75 MG tablet   Commonly known as: PLAVIX   Take 1 tablet by mouth Daily.      Co Q 10 100 MG Caps   Take 1 capsule by mouth daily.      Cod Liver Oil 1000 MG Caps   Take 1 capsule by mouth 2 (two) times daily.      dicyclomine 10 MG capsule   Commonly known as: BENTYL   Take 10 mg by mouth 4 (four) times daily -  before meals and at bedtime.      enoxaparin 40 MG/0.4ML Soln   Commonly known as: LOVENOX   Inject 0.4 mLs (40 mg total) into the skin daily.      Fish Oil 1000 MG Caps   Take 1 capsule by mouth 4 (four) times daily.      Flaxseed (Linseed) 1000 MG Caps   Take 3 capsules by mouth 2 (two) times daily.      furosemide 20 MG tablet   Commonly known as: LASIX   Take 20 mg by mouth daily.      Garlic 1000 MG Caps   Take 2 capsules by mouth at bedtime.      glimepiride 1 MG tablet   Commonly known as: AMARYL   Take 1 mg by mouth daily before breakfast.      GLUCOSAMINE CHONDR 1500 COMPLX PO   Take 1 capsule by mouth 2 (two) times daily.      levofloxacin 500 MG tablet   Commonly known as: LEVAQUIN   Take 1 tablet (500 mg total) by mouth daily.      losartan 100 MG tablet   Commonly known as: COZAAR   Take 100 mg by mouth daily.      metFORMIN 500 MG tablet   Commonly known as: GLUCOPHAGE   Take 500 mg by mouth 2 (two) times daily with a meal.      methocarbamol 500 MG tablet   Commonly known as: ROBAXIN   Take 1 tablet (500 mg total) by mouth 4 (four) times daily.      metoprolol 100 MG tablet   Commonly known as: LOPRESSOR   Take 100 mg by  mouth 2 (two) times daily.      niacin 500 MG CR tablet   Commonly known as: NIASPAN   Take 1,000 mg by mouth at bedtime.  nitroGLYCERIN 0.4 MG SL tablet   Commonly known as: NITROSTAT   Place 0.4 mg under the tongue every 5 (five) minutes as needed. For chest pain      omeprazole 20 MG capsule   Commonly known as: PRILOSEC   Take 40 mg by mouth daily.      oxyCODONE-acetaminophen 10-325 MG per tablet   Commonly known as: PERCOCET   Take 1-2 tablets by mouth every 6 (six) hours as needed for pain. MAXIMUM TOTAL ACETAMINOPHEN DOSE IS 4000 MG PER DAY      pravastatin 40 MG tablet   Commonly known as: PRAVACHOL   Take 40 mg by mouth every evening.      pregabalin 75 MG capsule   Commonly known as: LYRICA   Take 75 mg by mouth 2 (two) times daily.      psyllium 0.52 G capsule   Commonly known as: REGULOID   Take 0.52 g by mouth 2 (two) times daily.      Red Yeast Rice 600 MG Caps   Take 1 capsule by mouth 2 (two) times daily.      STRESSTABS PO   Take 1 tablet by mouth at bedtime.      trimethoprim 100 MG tablet   Commonly known as: TRIMPEX   Take 1 tablet by mouth at bedtime.      venlafaxine 37.5 MG tablet   Commonly known as: EFFEXOR   Take 37.5 mg by mouth daily.      vitamin C 1000 MG tablet   Take 1,000 mg by mouth daily.      Vitamin D3 1000 UNITS Caps   Take 1 capsule by mouth daily.      warfarin 5 MG tablet   Commonly known as: COUMADIN   Take 1 tablet (5 mg total) by mouth daily.         ASK your doctor about these medications         diclofenac 75 MG EC tablet   Commonly known as: VOLTAREN   Take 75 mg by mouth 2 (two) times daily. 1 tablet by mouth twice a day. Take with food      LYRICA 75 MG capsule   Generic drug: pregabalin   TAKE ONE CAPSULE BY MOUTH TWICE DAILY AS NEEDED      misoprostol 200 MCG tablet   Commonly known as: CYTOTEC   Take 200 mcg by mouth 2 (two) times daily.            Diagnostic Studies: No results  found.  Disposition: Home or Self Care  Discharge Orders    Future Appointments: Provider: Department: Dept Phone: Center:   10/11/2011 11:00 AM Michele Mcalpine, MD [facility] [phone] None     Future Orders Please Complete By Expires   Diet general      Call MD / Call 911      Comments:   If you experience chest pain or shortness of breath, CALL 911 and be transported to the hospital emergency room.  If you develope a fever above 101 F, pus (white drainage) or increased drainage or redness at the wound, or calf pain, call your surgeon's office.   Constipation Prevention      Comments:   Drink plenty of fluids.  Prune juice may be helpful.  You may use a stool softener, such as Colace (over the counter) 100 mg twice a day.  Use MiraLax (over the counter) for constipation as needed.   Increase activity  slowly as tolerated      Weight Bearing as taught in Physical Therapy      Comments:   Use a walker or crutches as instructed.   Discharge wound care:      Comments:   If you have a hip bandage, keep it clean and dry.  Change your bandage as instructed by your health care providers.  If your bandage has been discontinued, keep your incision clean and dry.  Pat dry after bathing.  DO NOT put lotion or powder on your incision.   TED hose      Comments:   Use stockings (TED hose) for 2 weeks on both leg(s).  You may remove them at night for sleeping.   Change dressing      Comments:   Change dressing in three days, then change the dressing daily with sterile 4 x 4 inch gauze dressing.  You may clean the incision with alcohol prior to redressing.   Do not put a pillow under the knee. Place it under the heel.         Follow-up Information    Follow up with Meral Geissinger P, MD in 1 week.   Contact information:   [facility] [address], [address] [place] [address] [phone]           Signed: Eulas Post 09/28/2011, 9:28 AM

## 2011-09-28 NOTE — Progress Notes (Signed)
Patient ID: [name] [name], female   DOB: 1941-06-07, 71 y.o.   MRN: [id number]  INFECTIOUS DISEASE PROGRESS NOTE    Date of Admission:  09/25/2011   Total days of antibiotics 4        Day 1 levofloxacin         Principal Problem:  *Infection of prosthetic knee joint, left      . aspirin EC  81 mg Oral Daily  . calcium-vitamin D  1 tablet Oral BID WC  . clopidogrel  75 mg Oral Q breakfast  . coumadin book   Does not apply Once  . dicyclomine  10 mg Oral TID AC & HS  . enoxaparin  30 mg Subcutaneous Q12H  . furosemide  20 mg Oral Daily  . glimepiride  1 mg Oral QAC breakfast  . levofloxacin  500 mg Oral Once   Followed by  . levofloxacin  250 mg Oral Q breakfast  . metFORMIN  500 mg Oral BID WC  . metoprolol tartrate  100 mg Oral Once  . metoprolol  100 mg Oral BID  . misoprostol  200 mcg Oral BID  . niacin  1,000 mg Oral QHS  . pantoprazole  40 mg Oral Q1200  . patient's guide to using coumadin book   Does not apply Once  . pregabalin  75 mg Oral BID  . psyllium  1 packet Oral BID  . simvastatin  20 mg Oral q1800  . trimethoprim  100 mg Oral QHS  . venlafaxine  37.5 mg Oral Daily  . vitamin C  1,000 mg Oral Daily  . warfarin  2.5 mg Oral ONCE-1800  . warfarin  2.5 mg Oral NOW  . warfarin   Does not apply Once  . DISCONTD: levofloxacin  500 mg Oral Daily  . DISCONTD: piperacillin-tazobactam (ZOSYN)  IV  3.375 g Intravenous Q8H    Subjective: She feels a little bit hot and sweaty this morning. [name] had a little bit of itching on her right forearm. She has not had any headache, cough, shortness of breath, diarrhea or dysuria. She had some slight nausea this morning without vomiting. She states that her left knee is feeling much better.  Objective: Temp:  [99.2 F (37.3 C)-101.4 F (38.6 C)] 100.9 F (38.3 C) (01/25 0619) Pulse Rate:  [80-120] 93  (01/25 1000) Resp:  [18-20] 20  (01/25 0619) BP: (104-138)/(44-63) 104/58 mmHg (01/25 1000) SpO2:  [93 %-98 %] 98 %  (01/25 0619)  General: She is slightly diaphoretic Skin: No rash in her right arm PICC site appears normal Lungs: Clear Cor: Regular S1 and S2 with no murmurs Abdomen: Soft and nontender. Her bowel sounds are normal. Her left knee the looks good  Lab Results Lab Results  Component Value Date   WBC 11.2* 09/28/2011   HGB 9.3* 09/28/2011   HCT 27.7* 09/28/2011   MCV 93.9 09/28/2011   PLT 222 09/28/2011    Lab Results  Component Value Date   CREATININE 1.13* 09/26/2011   BUN 22 09/26/2011   NA 130* 09/26/2011   K 5.2* 09/26/2011   CL 95* 09/26/2011   CO2 28 09/26/2011    Lab Results  Component Value Date   ALT 30 09/25/2011   AST 16 09/25/2011   ALKPHOS 84 09/25/2011   BILITOT 0.4 09/25/2011       Microbiology: Recent Results (from the past 240 hour(s))  URINE CULTURE     Status: Normal   Collection Time   09/25/11  12:55 PM      Component Value Range Status Comment   Specimen Description URINE, CLEAN CATCH   Final    Special Requests NONE   Final    Setup Time [id number]   Final    Colony Count >=100,000 COLONIES/ML   Final    Culture KLEBSIELLA PNEUMONIAE   Final    Report Status 09/27/2011 FINAL   Final    Organism ID, Bacteria KLEBSIELLA PNEUMONIAE   Final   BODY FLUID CULTURE     Status: Normal   Collection Time   09/25/11  4:04 PM      Component Value Range Status Comment   Specimen Description SYNOVIAL KNEE LEFT   Final    Special Requests NONE   Final    Gram Stain     Final    Value: ABUNDANT WBC PRESENT,BOTH PMN AND MONONUCLEAR     NO ORGANISMS SEEN   Culture FEW KLEBSIELLA PNEUMONIAE   Final    Report Status 09/28/2011 FINAL   Final    Organism ID, Bacteria KLEBSIELLA PNEUMONIAE   Final   ANAEROBIC CULTURE     Status: Normal (Preliminary result)   Collection Time   09/25/11  4:04 PM      Component Value Range Status Comment   Specimen Description SYNOVIAL KNEE LEFT   Final    Special Requests NONE   Final    Gram Stain     Final    Value: ABUNDANT WBC  PRESENT,BOTH PMN AND MONONUCLEAR     NO SQUAMOUS EPITHELIAL CELLS SEEN     NO ORGANISMS SEEN   Culture     Final    Value: NO ANAEROBES ISOLATED; CULTURE IN PROGRESS FOR 5 DAYS   Report Status PENDING   Incomplete   TISSUE CULTURE     Status: Normal   Collection Time   09/25/11  4:08 PM      Component Value Range Status Comment   Specimen Description TISSUE KNEE LEFT   Final    Special Requests NONE   Final    Gram Stain     Final    Value: MODERATE WBC PRESENT,BOTH PMN AND MONONUCLEAR     NO SQUAMOUS EPITHELIAL CELLS SEEN     NO ORGANISMS SEEN   Culture FEW KLEBSIELLA PNEUMONIAE   Final    Report Status 09/28/2011 FINAL   Final    Organism ID, Bacteria KLEBSIELLA PNEUMONIAE   Final   ANAEROBIC CULTURE     Status: Normal (Preliminary result)   Collection Time   09/25/11  4:08 PM      Component Value Range Status Comment   Specimen Description TISSUE KNEE LEFT   Final    Special Requests NONE   Final    Gram Stain     Final    Value: MODERATE WBC PRESENT,BOTH PMN AND MONONUCLEAR     NO SQUAMOUS EPITHELIAL CELLS SEEN     NO ORGANISMS SEEN   Culture     Final    Value: NO ANAEROBES ISOLATED; CULTURE IN PROGRESS FOR 5 DAYS   Report Status PENDING   Incomplete    Assessment: She has developed some low-grade fever postoperatively. The source of her fever is unclear to me at this time. She has Klebsiella prosthetic knee infection and this can be treated with oral levofloxacin. She has been scheduled for discharge and feels like she is able to go home. I will arrange followup in our clinic to help determine duration of antibiotic  therapy. I've asked her to call if she continues to have fever or has other concerns between now and her first clinic visit.  Plan: 1. Continue levofloxacin for 6 weeks minimum 2. Removed PICC line 3. Please call my partner, Dr. Judyann Munson [phone]), for any infectious disease questions this weekend.   Cliffton Asters, MD [facility] for Infectious  Diseases [facility] [phone] pager   [phone] cell 09/28/2011, 10:48 AM

## 2011-09-28 NOTE — Progress Notes (Signed)
Admit Complaint: Infected L knee  Pharmacist System-Based Medication Review:  Anticoag-Coumadin for VTE px POD#3 s/p I&D, revision of infected L TKA;  INR 1.54; CBC stable. No overt bleeding reported. Warf points =1; Noted patient on concurrent ASA, Plavix. Home naproxen not resumed, but misoprostol is on board. (effexor, statin also incr risk of bleeding). To be discharge today   1) coumadin 2.5mg  po x1 prior to d/c and further dosing per Home Health

## 2011-09-28 NOTE — Progress Notes (Signed)
Physical Therapy Treatment Patient Details Name: [name] [name] MRN: [id number] DOB: Mar 26, 1941 Today's Date: 09/28/2011  PT Assessment/Plan:   Patient refused tx this date due to nausea. PT Goals     PT Treatment Precautions/Restrictions  Precautions Precautions: Knee Required Braces or Orthoses: Yes Knee Immobilizer: Discontinue post op day 2 Restrictions Weight Bearing Restrictions: Yes LLE Weight Bearing: Weight bearing as tolerated Mobility (including Balance)      Exercise    End of Session    Marcene Brawn 09/28/2011, 3:07 PM  Lewis Shock, PT, DPT Pager #: [phone] Office #: [phone]

## 2011-09-29 NOTE — Progress Notes (Signed)
CARE MANAGEMENT NOTE 09/29/2011 Patient has DME at home. Will be discharged on oral levaquin. Pt should resume HH therapy.

## 2011-09-30 LAB — ANAEROBIC CULTURE

## 2011-10-05 ENCOUNTER — Other Ambulatory Visit: Payer: Self-pay | Admitting: Cardiovascular Disease

## 2011-10-11 ENCOUNTER — Ambulatory Visit: Payer: Medicare Other | Admitting: Pulmonary Disease

## 2011-10-11 DIAGNOSIS — M25569 Pain in unspecified knee: Secondary | ICD-10-CM | POA: Diagnosis not present

## 2011-10-11 DIAGNOSIS — A1802 Tuberculous arthritis of other joints: Secondary | ICD-10-CM | POA: Diagnosis not present

## 2011-10-11 DIAGNOSIS — T847XXA Infection and inflammatory reaction due to other internal orthopedic prosthetic devices, implants and grafts, initial encounter: Secondary | ICD-10-CM | POA: Diagnosis not present

## 2011-10-16 ENCOUNTER — Encounter: Payer: Self-pay | Admitting: Internal Medicine

## 2011-10-16 ENCOUNTER — Ambulatory Visit (INDEPENDENT_AMBULATORY_CARE_PROVIDER_SITE_OTHER): Payer: Medicare Other | Admitting: Internal Medicine

## 2011-10-16 VITALS — BP 123/67 | HR 89 | Temp 98.6°F | Wt 141.0 lb

## 2011-10-16 DIAGNOSIS — T8459XA Infection and inflammatory reaction due to other internal joint prosthesis, initial encounter: Secondary | ICD-10-CM

## 2011-10-16 DIAGNOSIS — Z96659 Presence of unspecified artificial knee joint: Secondary | ICD-10-CM | POA: Diagnosis not present

## 2011-10-16 DIAGNOSIS — T8450XA Infection and inflammatory reaction due to unspecified internal joint prosthesis, initial encounter: Secondary | ICD-10-CM | POA: Diagnosis not present

## 2011-10-16 NOTE — Progress Notes (Signed)
Patient ID: [name] [name], female   DOB: Jul 13, 1941, 71 y.o.   MRN: [id number]  INFECTIOUS DISEASE PROGRESS NOTE    Subjective: Mrs. Kudo is in for her hospital followup visit. She was hospitalized last month and found to have a left prosthetic knee infection with Klebsiella. She was discharged on oral levofloxacin which she has been taking now for 22 days. She knows to have stopped her calcium while on the levofloxacin. She states that she is feeling better and having less pain. She also notes that the range of motion in her left knee is improving. She is still bothered by poor appetite but does not have any nausea, vomiting or diarrhea.  Objective: Temp: 98.6 F (37 C) (02/12 1116) Temp src: Oral (02/12 1116) BP: 123/67 mmHg (02/12 1116) Pulse Rate: 89  (02/12 1116)  General: She is slightly pale but in no distress Abdomen: Soft and nontender with normal bowel sounds Her left knee incision is healing nicely. She still has Steri-Strips over the mid point of the incision. Her knee is slightly swollen but is not red or warm to touch.  Lab Results Lab Results  Component Value Date   CRP 14.26* 09/26/2011    Lab Results  Component Value Date   ESRSEDRATE 10 09/25/2011      Assessment: She is improving on therapy for Klebsiella prosthetic knee infection. I will repeat inflammatory markers today and see her back in 3 weeks.  Plan: 1. Continue levofloxacin 2. Repeat sedimentation rate and C-reactive protein   Cliffton Asters, MD [facility] for Infectious Diseases [facility] [phone] pager   [phone] cell 10/16/2011, 11:58 AM

## 2011-10-17 ENCOUNTER — Encounter: Payer: Self-pay | Admitting: Gastroenterology

## 2011-10-17 LAB — SEDIMENTATION RATE: Sed Rate: 22 mm/h (ref 0–22)

## 2011-10-23 ENCOUNTER — Other Ambulatory Visit: Payer: Self-pay | Admitting: Pulmonary Disease

## 2011-11-08 ENCOUNTER — Ambulatory Visit: Payer: Medicare Other | Admitting: Internal Medicine

## 2011-11-13 ENCOUNTER — Encounter: Payer: Self-pay | Admitting: Internal Medicine

## 2011-11-13 ENCOUNTER — Ambulatory Visit (INDEPENDENT_AMBULATORY_CARE_PROVIDER_SITE_OTHER): Payer: Medicare Other | Admitting: Internal Medicine

## 2011-11-13 VITALS — Ht 61.0 in | Wt 142.0 lb

## 2011-11-13 DIAGNOSIS — Z96659 Presence of unspecified artificial knee joint: Secondary | ICD-10-CM

## 2011-11-13 DIAGNOSIS — T8450XA Infection and inflammatory reaction due to unspecified internal joint prosthesis, initial encounter: Secondary | ICD-10-CM

## 2011-11-13 DIAGNOSIS — T8459XA Infection and inflammatory reaction due to other internal joint prosthesis, initial encounter: Secondary | ICD-10-CM

## 2011-11-13 MED ORDER — LEVOFLOXACIN 500 MG PO TABS: 500.0000 mg | ORAL_TABLET | Freq: Every day | ORAL | Status: DC

## 2011-11-13 NOTE — Progress Notes (Signed)
Patient ID: [name] [name], female   DOB: 1941-02-11, 71 y.o.   MRN: [id number]  INFECTIOUS DISEASE PROGRESS NOTE    Subjective: [name] [name] is in for her routine visit. She has completed 7 weeks of levofloxacin therapy for her Klebsiella left prosthetic knee infection. She has tolerated levofloxacin well and is feeling better.She has some soreness but no "pain" in her left knee and is now riding the stationary bike.  Objective:    General: she appears complfortable L knee: her knee is slightly warm and swollen but not painful. She has good ROM  Lab Results Lab Results  Component Value Date   CRP 3.61* 10/16/2011    Lab Results  Component Value Date   ESRSEDRATE 22 10/16/2011      Assessment: Our guidelines do not offer much assistance in determining the optimal duration of therapy for gram negative rod prosthetic joint infection. I will repeat her ESR and CRP and plan on at least 3 months total therapy.  Plan: 1. Continue levofloxacin 2. Repeat ESR and CRP 3. Return in 6 weeks   Cliffton Asters, MD [facility] for Infectious Diseases [facility] [phone] pager   [phone] cell 11/13/2011, 12:32 PM

## 2011-11-14 DIAGNOSIS — Z124 Encounter for screening for malignant neoplasm of cervix: Secondary | ICD-10-CM | POA: Diagnosis not present

## 2011-11-14 LAB — SEDIMENTATION RATE: Sed Rate: 1 mm/hr (ref 0–22)

## 2011-11-14 LAB — C-REACTIVE PROTEIN: CRP: 0.19 mg/dL (ref <0.60)

## 2011-11-15 ENCOUNTER — Other Ambulatory Visit: Payer: Self-pay | Admitting: Obstetrics and Gynecology

## 2011-11-15 DIAGNOSIS — N644 Mastodynia: Secondary | ICD-10-CM

## 2011-11-16 ENCOUNTER — Ambulatory Visit: Payer: Medicare Other | Admitting: Pulmonary Disease

## 2011-11-17 ENCOUNTER — Other Ambulatory Visit: Payer: Self-pay | Admitting: Pulmonary Disease

## 2011-11-21 ENCOUNTER — Ambulatory Visit
Admission: RE | Admit: 2011-11-21 | Discharge: 2011-11-21 | Disposition: A | Discharge status: Home / Self Care | Payer: Medicare Other | Source: Ambulatory Visit | Attending: Obstetrics and Gynecology | Admitting: Obstetrics and Gynecology

## 2011-11-21 DIAGNOSIS — N644 Mastodynia: Secondary | ICD-10-CM | POA: Diagnosis not present

## 2011-11-28 ENCOUNTER — Ambulatory Visit (INDEPENDENT_AMBULATORY_CARE_PROVIDER_SITE_OTHER): Payer: Medicare Other | Admitting: Pulmonary Disease

## 2011-11-28 ENCOUNTER — Encounter: Payer: Self-pay | Admitting: Pulmonary Disease

## 2011-11-28 VITALS — BP 126/72 | HR 72 | Temp 98.3°F | Ht 61.0 in | Wt 147.0 lb

## 2011-11-28 DIAGNOSIS — E782 Mixed hyperlipidemia: Secondary | ICD-10-CM

## 2011-11-28 DIAGNOSIS — I10 Essential (primary) hypertension: Secondary | ICD-10-CM | POA: Diagnosis not present

## 2011-11-28 DIAGNOSIS — F411 Generalized anxiety disorder: Secondary | ICD-10-CM

## 2011-11-28 DIAGNOSIS — M1990 Unspecified osteoarthritis, unspecified site: Secondary | ICD-10-CM

## 2011-11-28 DIAGNOSIS — G894 Chronic pain syndrome: Secondary | ICD-10-CM

## 2011-11-28 DIAGNOSIS — I2510 Atherosclerotic heart disease of native coronary artery without angina pectoris: Secondary | ICD-10-CM | POA: Diagnosis not present

## 2011-11-28 DIAGNOSIS — M542 Cervicalgia: Secondary | ICD-10-CM

## 2011-11-28 DIAGNOSIS — E119 Type 2 diabetes mellitus without complications: Secondary | ICD-10-CM | POA: Diagnosis not present

## 2011-11-28 DIAGNOSIS — K219 Gastro-esophageal reflux disease without esophagitis: Secondary | ICD-10-CM

## 2011-11-28 MED ORDER — TRAMADOL HCL 50 MG PO TABS: 50.0000 mg | ORAL_TABLET | Freq: Three times a day (TID) | ORAL | Status: DC | PRN

## 2011-11-28 NOTE — Progress Notes (Addendum)
Subjective:    Patient ID: [name] [name], female    DOB: 03-24-1941, 71 y.o.   MRN: [id number]  HPI 71 y/o WF here for a follow up visit... he has multiple medical problems as noted below...      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 they started Rx w/ phys therapy.    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  ~  April 20, 2010:  she's had bilat cat surg & she's seen Dr Caoilainn East (stable [name]), DrTannenbaum (started pelvic floor PT), and DrMurphy (given shot in knee- needs TKR) over the last 10months... she notes occas wheezing/ SOB, but she's been exerc on a treadmill...  BP stable on meds;  Chol is improved on Crestor but TGs elevated & may need Fibrate;  BS OK on Metformin but A1c not as good w/ weight gain;  we decided to check CXR (NAD), and PFTs (mild restriction)...  ~  October 09, 2010:  she is sched for left TKR by DrMurphy next month & needs medical clearance> already seen by DrCooper 1/12 & given the OK from Cards standpoint... she has continued neck pain & HA issues> followed by Carles Collet but she notes that Arthrotec, Tramadol, Methocarbamol, Lyrica - all w/o help for her recent post neck/ head pain... we decided to change the Tramadol to Vicodin (lim 3 daily) & see if this works better... breathing is stable on Advair; BP controlled on current med rx;  denies CP, palpit, SOB, cerebral ischemic symptoms, etc; she will ret to lab for fasting blood work...  ~  April 10, 2011:  60mo ROV & she  states she's doing much better> she is s/p left TKR 3/12 by DrMurphy; recent cellulitis left leg evaluated by DrDraper & treated w/ Keflex & resolved (VenDopplers were neg as well);  BP controlled w/ Metoprolol, Losartan, Lasix; she denies CP, palpit, SOB, & denies cerebral ischemic symptoms;  Chol looks good on Prav40 but TGs are worse & we discussed low fat diet & FishOil Rx;  BS much improved w/ Metformin & Glimep (A1c=5.8 & rec to decr glimep1mg  to 1/2 Qam)... She had a gastroenteritis 5/12 treated w/ Cipro, fluids, supportive care & resolved...    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    She also saw Urology 6/12 for f/u of her chronic cystitis & incont> tried on Gelnique, but didn't stick w/ it...  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    She is c/o soreness & tenderness in her left armpit & across her chest; the discomfort comes & goes, not ppt by exercise, she has additional neck pain that radiates into her left shoulder area & is followed by DrHirsh- seen 11/12 for her nonunion & spondylosis, on Arthrotec & Robaxin;  Additionally DrTomblin sent her for a mammogram & it was neg; they set up this f/u appt w/ me; she has hx mult somatic complaints & as noted mult specialists  tending to her needs...    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  NOTE> the knee prob developed after she was seen by Cards in Jan; her Plavix is on hold now because she is still on Coumadin due to her knee surg- she was told to "finish this up" by DrLandau & apparently she had a lot of the Coumadin left (she is almost out now & will switch back to Plavix)...     She also saw Urology, DrTannenbaum 12/12 w/ recurrent infections, chronic cystitis, urge incont, vag dryness & dyspareunia; on Premarin vag cream, & failed Gelnique & Vesicare Rx; she also took Trimethoprim Qhs for suppression but had a pansens Klebs UTI anyway that was treated w/ Cipro; the Trimethoprim is on hold while she is taking the Levaquin; DrT started her on Myrbetriq 50mg /d as a trial...     She has an impressive med list w/ >30 entries but many are supplements and herbs which she is not inclined to discontinue...  EKG today showed NSR, rate70, poor R progression, otherw neg EKG, NAD...   PROBLEM LIST:    Hx of BRONCHITIS (ICD-466.0) - hx AB w/ refractory episode 4/11 & we added ADVAIR 100Bid, MUCINEX OTC, & Hydromet cough syrup Prn... ~  8/11:  she still c/o some wheezing & wants cough syrup refilled... CXR= clear, NAD; and PFTs show mild restriction only. ~  CXR 3/12 showed sl incr markings, borderline heart size, cervical plate, NAD...  HYPERTENSION (ICD-401.9) - controlled on  METOPROLOL 100mg Bid, LOSARTAN 50mg /d, and LASIX 20mg /d... BP= 128/80 and feeling OK- without HA, visual symptoms, CP, palpit, SOB, edema, etc...  CORONARY ARTERY DISEASE (ICD-414.00) - on above + ASA 81mg /d + PLAVIX 75mg /d...  followed by DrCooper and his notes are reviewed. 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 she had PCI  w/ drug eluting stent in RCA... ~  NuclearStressTest 12/08 showed sm area of ischemia inferolateral wall, norm LVF=70%... ~  2DEcho 12/08 showed no LV regional wall motion abn, EF=60%, some DD was seen... ~  saw DrCooper 11/09- note reviewed... continue same meds, consider re-cath if CP occurs. ~  recurrent CP 12/10:  Myoview w/ sm inferoapical infarct, no ischemia, EF= 76%;  12/10 cath showed 20%LMain, 60-70%midLAD, 40-50%ostial CIRC, midRCA stent patent w/o restenosis, EF= 65%... stable, no change from 2007, med Rx.  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  CEREBROVASCULAR DISEASE (ICD-437.9) - CT Br 2/09 showed sm vessel disease and old lacunar infarct... on ASA +  PLAVIX.  MIXED HYPERLIPIDEMIA (ICD-272.2) - on PRAVASTATIN 40mg /d + NIASPAN 500- 2Qhs + FISH OIL daily... ~  FLP 8/08 on Lipitor80 showed TChol 127, TG 201, HDL 34, LDL 61 ~  FLP 7/09 showed TChol 129, TG 265, HDL 36, LDL 65... Lipid Clinic may want to consider a fibrate... ~  FLP 9/09 howed TChol 152, TG 225, HDL 30, LDL 70 ~  FLP 1/10 showed TChol 137, TG 203, HDL 38, LDL 58 ~  FLP 9/10 showed TChol 154, TG 225, HDL 42, LDL 81 ~  FLP 8/11 showed TChol 118, TG 332, HDL 32, LDL 45... may need fibrate. ~  FLP 2/12 showed TChol 134, TG 257, HDL 33, LDL 58... needs better low fat diet! ~  FLP 8/12 showed TChol 174, TG 320, HDL 41, LDL 75... ditto  DIABETES MELLITUS, TYPE II (ICD-250.00) - on METFORMIN 500mg - 1/2 tabBid... ~  labs 8/08 showed BS= 162, HgA1c= 6.5.Marland KitchenMarland Kitchen ~  labs 2/09 showed BS= 154... ~  labs 9/09 (wt=152#) showed BS= 141, HgA1c= 6.1 ~  labs 9/10 (wt=126#) showed BS= 136, A1c= 6.1 ~  labs 8/11 showed BS= 137, A1c= 7.1 ~  labs 2/12 showed BS= 192, A1c= 7.5.Marland KitchenMarland Kitchen incr Metform500Bid, Add Glimep1mg /d. ~  Labs 8/12 showed BS= 112, A1c= 5.8.Marland KitchenMarland Kitchen rec decr Glimep1mg  to 1/2 tab Qam. ~  3/13: review of EPIC labs shows BS 81-131 over the last 3  months...  GASTROESOPHAGEAL REFLUX DISEASE (ICD-530.81) - last EGD 4/08 showed GERD and deformed pylorus... Rx w/ PPI... ~  she had a follow up w/ DrPatterson 7/09 for her GERD- on OMEP 20mg Bid, and IBS- on fiber & Bentyl... ~  5/12:  She had ?gastroenteritis & 10 lb wt loss, treated by TP w/ Cipro, Immodium, Bentyl==> improved.  DIVERTICULAR DISEASE (ICD-562.10) - last colonoscopy 11/04 by DrSam showed divertics only... IBS (ICD-564.1) - on BENTYL 10mg  Tid Prn...  URINARY INCONTINENCE (ICD-788.30) - eval 7/08 by DrTannenbaum- prev pubovag sling surg 2004... she has persist symptoms and he rec physical therapy... ~  she has had recurrent UTIs & Rx w/ Cipro... ~  6/12:  Had f/u Urology for her chr cystitis & incont, tried Gelnique, "no help" per pt & stopped. ~  3/13:  SEE ABOVE  DEGENERATIVE JOINT DISEASE (ICD-715.90)   < SEE ABOVE > ~  ER eval 12/10 for left knee pain- XRay w/ DJD, sm effusion- refer to Ortho, DrMurphy for shot (helped). ~  Office f/u 7/11 w/ another shot given- consider TKR. ~  3/12:  She had left TKR by DrMurphy, followed by rehab etc & improved... ~  3/13:  SEE ABOVE  NECK PAIN, CHRONIC (ICD-723.1) - s/p neck fusion surg x 2 in 2005 and 2006 by DrHirsh w/ a complex situation involving non-union, severe spondylosis, etc... notes from DrHirsh reviewed> on TRAMADOL 50mg  prn, & METHOCARBAMOL 500mg Qid. ~  saw DrHirsh 9/10 & 5/11- stable, continue conservative management... ~  2/12:  OV c/o some incr neck discomfort & HA> XRays unchanged, rec VICODIN up to 3/d as needed.  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  DIZZINESS, CHRONIC (ICD-780.4) - full eval by DrSethi w/ CT/ MRI etc... Dx'd w/ benign positional vertigo and Rx'd w/ Eply manuever as needed.  ANXIETY (ICD-300.00) - on EFFEXOR 37.5mg /d for hot flashes & dizziness (she states this helps)... ~  Sep09:  she wants her "Factor V  checked"... states that her sister & granddaughter see DrEnnever w/ clots and  he thought it would be a good idea to check [place] for Factor V Leiden Mutation (she has never had a venous thromboembolic phenomenon)... 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 LABS 9/09 neg for Factor V mutation.   Past Surgical History  Procedure Date  . Cholecystectomy   . Cervical spine surgery 07/2005    Dr Alexsandria East  . Bladder repair   . Total abdominal hysterectomy   . Tonsillectomy   . Thumb surgery   . Neck surgery   . Temporomandibular joint surgery   . Left total knee replacement 11/2010    Dr Eulah Pont  . I&d knee with poly exchange 09/25/2011    Procedure: IRRIGATION AND DEBRIDEMENT KNEE WITH POLY EXCHANGE;  Surgeon: Eulas Post, MD;  Location: [facility] OR;  Service: Orthopedics;  Laterality: Left;    Outpatient Encounter Prescriptions as of 11/28/2011  Medication Sig Dispense Refill  . Aloe Vera 25 MG CAPS Take 1 capsule by mouth 2 (two) times daily.        . Ascorbic Acid (VITAMIN C) 1000 MG tablet Take 1,000 mg by mouth daily.        Marland Kitchen aspirin 81 MG tablet Take 81 mg by mouth daily.        . calcium carbonate (OS-CAL) 600 MG TABS Take 600 mg by mouth 2 (two) times daily with a meal.        . Cholecalciferol (VITAMIN D3) 1000 UNITS CAPS Take 1 capsule by mouth daily.        . Cinnamon 500 MG capsule Take 500 mg by mouth daily.        . clopidogrel (PLAVIX) 75 MG tablet Take 1 tablet by mouth Daily.      Marland Kitchen Cod Liver Oil 1000 MG CAPS Take 1 capsule by mouth 2 (two) times daily.        . Coenzyme Q10 (CO Q 10) 100 MG CAPS Take 1 capsule by mouth daily.        Marland Kitchen dicyclomine (BENTYL) 10 MG capsule TAKE ONE CAPSULE BY MOUTH UP TO THREE TIMES DAILY AS NEEDED FOR ABDOMINAL CRAMPING  90 capsule  6  . enoxaparin (LOVENOX) 40 MG/0.4ML SOLN Inject 0.4 mLs (40 mg total) into the skin daily.  5 Syringe  0  . Flaxseed, Linseed, 1000 MG CAPS Take 3 capsules by  mouth 2 (two) times daily.        . furosemide (LASIX) 20 MG tablet Take 20 mg by mouth daily.      . Garlic 1000 MG CAPS Take 2 capsules by mouth at bedtime.        Marland Kitchen glimepiride (AMARYL) 1 MG tablet TAKE ONE TABLET BY MOUTH EVERY DAY IN THE MORNING  30 tablet  11  . Glucosamine-Chondroit-Vit C-Mn (GLUCOSAMINE CHONDR 1500 COMPLX PO) Take 1 capsule by mouth 2 (two) times daily.        Marland Kitchen HYDROcodone-acetaminophen (NORCO) 5-325 MG per tablet Take 1 tablet by mouth every 6 (six) hours as needed.      Marland Kitchen levofloxacin (LEVAQUIN) 500 MG tablet Take 1 tablet (500 mg total) by mouth daily.  30 tablet  4  . losartan (COZAAR) 100 MG tablet Take 100 mg by mouth daily.      . metFORMIN (GLUCOPHAGE) 500 MG tablet TAKE ONE TABLET BY MOUTH TWICE DAILY  60 tablet  6  . methocarbamol (ROBAXIN) 500 MG tablet Take 500 mg by mouth 4 (four) times  daily as needed.      . metoprolol (LOPRESSOR) 100 MG tablet Take 100 mg by mouth 2 (two) times daily.      . Multiple Vitamin (STRESSTABS PO) Take 1 tablet by mouth at bedtime.        . niacin (NIASPAN) 500 MG CR tablet Take 1,000 mg by mouth at bedtime.      . nitroGLYCERIN (NITROSTAT) 0.4 MG SL tablet Place 0.4 mg under the tongue every 5 (five) minutes as needed. For chest pain      . Omega-3 Fatty Acids (FISH OIL) 1000 MG CAPS Take 1 capsule by mouth 4 (four) times daily.        Marland Kitchen omeprazole (PRILOSEC) 20 MG capsule Take 40 mg by mouth daily.      . pravastatin (PRAVACHOL) 40 MG tablet Take 40 mg by mouth every evening.      . pregabalin (LYRICA) 75 MG capsule Take 75 mg by mouth 2 (two) times daily.      . psyllium (REGULOID) 0.52 G capsule Take 0.52 g by mouth 2 (two) times daily.        . Red Yeast Rice 600 MG CAPS Take 1 capsule by mouth 2 (two) times daily.        Marland Kitchen trimethoprim (TRIMPEX) 100 MG tablet Take 1 tablet by mouth at bedtime.      Marland Kitchen venlafaxine (EFFEXOR) 37.5 MG tablet Take 37.5 mg by mouth daily.      Marland Kitchen warfarin (COUMADIN) 5 MG tablet Take 1 tablet (5  mg total) by mouth daily.  30 tablet  1    Allergies  Allergen Reactions  . Codeine     REACTION: nausea  . Prednisone     REACTION: rash  . Sulfonamide Derivatives     REACTION: rash  . Tetracycline     REACTION: rash    Current Medications, Allergies, Past Medical History, Past Surgical History, Family History, and Social History were reviewed in [employer] record.    Review of Systems    See HPI - all other systems neg except as noted...  The patient complains of dyspnea on exertion, headaches, and depression.  The patient denies anorexia, fever, weight loss, weight gain, vision loss, decreased hearing, hoarseness, chest pain, syncope, peripheral edema, prolonged cough, hemoptysis, abdominal pain, melena, hematochezia, severe indigestion/heartburn, hematuria, incontinence, muscle weakness, suspicious skin lesions, transient blindness, difficulty walking, unusual weight change, abnormal bleeding, enlarged lymph nodes, and angioedema.     Objective:   Physical Exam    WD, WN, 71 y/o WF in NAD... GENERAL:  Alert & oriented; pleasant & cooperative... HEENT:  [place]/AT, EOM-wnl, PERRLA, EACs-clear, TMs-wnl, NOSE-clear, THROAT-clear & wnl. NECK:  Supple w/ decrROM & scars from surg; no JVD; normal carotid impulses w/o bruits;  no thyromegaly or nodules palpated; no lymphadenopathy. CHEST:  Clear to P & A; without wheezes/ rales/ or rhonchi heard... HEART:  Regular Rhythm; without murmurs/ rubs/ or gallops detected... ABDOMEN:  Soft & nontender; normal bowel sounds; no organomegaly or masses palpated... EXT: s/p LTKR, mod arthritic changes; no varicose veins/ +venous insuffic/ tr edema. She has decr ROM left shoulder & neck, tender left pectoralis muscle & chest wall... NEURO:  CN's intact; no focal neuro deficits... DERM:  No lesions noted; no rash etc...  RADIOLOGY DATA:  Reviewed in the EPIC EMR & discussed w/ the patient...  LABORATORY DATA:  Reviewed in  the EPIC EMR & discussed w/ the patient...   Assessment & Plan:  Pain/ tender/ +decr ROM left shoulder/ neck> she is followed by DrHirsh & will check w/ DrMurphy/ Dion Saucier about her shoulder; currently on Tramadol, Lyrica, Tylenol, and a multitude of herbs & supplements...  Infected left TKR prosthesis>  On Levaquin under the direction of DrCampbell for ID & much improved...   HBP>  BP stable on Rx w/ Metoprolol, Losartan, & Lasix; continue same...  CAD>  On ASA/ Plavix (soon to be restarted) & followed by DrCooper; stable w/o angina, palpit, SOB, edema...  Cerebrovasc dis>  Stable on meds and denies cerebral ischemic symptoms...  HYPERLIPIDEMIA>  TGs still elev & needs better low fat diet; continue same meds for now...  DM>  Improved control & w/ A1c down to 5.8 we will back off on the Glimep now down to 1/2 tab Qam...  GI> GERD, Divertics, IBS>  Stable on Omep & Bentyl...  GU> UTIs, chr cystitis, incont>  Followed & managed by DrTannenbaum...  DJD> s/p left TKR> managed by drMurphy et al...  [name]>  S/p surgeries w/ fixation, plating, non-union, etc & followed & managed by DrHirsh...  Anxiety>  On Effexor which helps her hot flashes & dizziness she says.Marland KitchenMarland Kitchen

## 2011-11-28 NOTE — Patient Instructions (Signed)
Today we updated your med list in our EPIC system...    Continue your current medications the same...  For your chest wall discomfort:    Rest the chest...    Apply heat as needed...    Try the TRAMADOL 50mg  w/ an extra strength Tylenol as needed...  Call for any questions...  Let's plan a follow up visit w/ FASTING labs in about 4-6 months time.Marland KitchenMarland Kitchen

## 2011-12-01 ENCOUNTER — Encounter: Payer: Self-pay | Admitting: Pulmonary Disease

## 2011-12-04 ENCOUNTER — Ambulatory Visit: Payer: Medicare Other | Admitting: Pulmonary Disease

## 2011-12-14 DIAGNOSIS — N3946 Mixed incontinence: Secondary | ICD-10-CM | POA: Diagnosis not present

## 2011-12-14 DIAGNOSIS — N952 Postmenopausal atrophic vaginitis: Secondary | ICD-10-CM | POA: Diagnosis not present

## 2011-12-14 DIAGNOSIS — R32 Unspecified urinary incontinence: Secondary | ICD-10-CM | POA: Diagnosis not present

## 2011-12-21 DIAGNOSIS — M542 Cervicalgia: Secondary | ICD-10-CM | POA: Diagnosis not present

## 2011-12-24 ENCOUNTER — Other Ambulatory Visit: Payer: Self-pay | Admitting: Neurosurgery

## 2011-12-24 ENCOUNTER — Other Ambulatory Visit: Payer: Self-pay | Admitting: Pulmonary Disease

## 2011-12-24 DIAGNOSIS — M542 Cervicalgia: Secondary | ICD-10-CM

## 2011-12-25 ENCOUNTER — Ambulatory Visit (INDEPENDENT_AMBULATORY_CARE_PROVIDER_SITE_OTHER): Payer: Medicare Other | Admitting: Internal Medicine

## 2011-12-25 ENCOUNTER — Encounter: Payer: Self-pay | Admitting: Internal Medicine

## 2011-12-25 VITALS — BP 152/79 | HR 72 | Temp 98.2°F | Ht 61.0 in | Wt 144.2 lb

## 2011-12-25 DIAGNOSIS — T8450XA Infection and inflammatory reaction due to unspecified internal joint prosthesis, initial encounter: Secondary | ICD-10-CM | POA: Diagnosis not present

## 2011-12-25 DIAGNOSIS — T8459XA Infection and inflammatory reaction due to other internal joint prosthesis, initial encounter: Secondary | ICD-10-CM

## 2011-12-25 DIAGNOSIS — Z96659 Presence of unspecified artificial knee joint: Secondary | ICD-10-CM

## 2011-12-25 NOTE — Progress Notes (Signed)
Patient ID: [name] [name], female   DOB: October 26, 1940, 71 y.o.   MRN: [id number]  INFECTIOUS DISEASE PROGRESS NOTE    Subjective: [name] [name] is in for her routine visit. She has now completed 13 weeks of oral levofloxacin for her Klebsiella left prosthetic knee infection. She is tolerating her antibiotics well and her knee is feeling much better. She is riding a stationary bike and has been able to resume most of her normal activities. She is not requiring any pain medications.  Objective: Temp: 98.2 F (36.8 C) (04/23 1102) Temp src: Oral (04/23 1102) BP: 152/79 mmHg (04/23 1102) Pulse Rate: 72  (04/23 1102)  General: She is in good spirits Left knee: Her incision is healed. She has very mild swelling but no fever or redness.  Lab Results Lab Results  Component Value Date   CRP 0.19 11/13/2011    Lab Results  Component Value Date   ESRSEDRATE 1 11/13/2011      Assessment: I think there is a very the possibility that her prosthetic knee infection has been cured. I recommend that we stop levofloxacin and observe carefully. She is in agreement with that plan.  Plan: 1. Discontinue levofloxacin 2. Return in 2 months   Cliffton Asters, MD [facility] Infectious [facility] [phone] pager   [phone] cell 12/25/2011, 11:29 AM

## 2012-01-02 ENCOUNTER — Ambulatory Visit
Admission: RE | Admit: 2012-01-02 | Discharge: 2012-01-02 | Disposition: A | Discharge status: Home / Self Care | Payer: Medicare Other | Source: Ambulatory Visit | Attending: Neurosurgery | Admitting: Neurosurgery

## 2012-01-02 DIAGNOSIS — M5020 Other cervical disc displacement, unspecified cervical region: Secondary | ICD-10-CM | POA: Diagnosis not present

## 2012-01-02 DIAGNOSIS — M542 Cervicalgia: Secondary | ICD-10-CM

## 2012-01-02 DIAGNOSIS — M5030 Other cervical disc degeneration, unspecified cervical region: Secondary | ICD-10-CM | POA: Diagnosis not present

## 2012-01-02 DIAGNOSIS — M47812 Spondylosis without myelopathy or radiculopathy, cervical region: Secondary | ICD-10-CM | POA: Diagnosis not present

## 2012-01-09 DIAGNOSIS — M542 Cervicalgia: Secondary | ICD-10-CM | POA: Diagnosis not present

## 2012-02-05 DIAGNOSIS — N3946 Mixed incontinence: Secondary | ICD-10-CM | POA: Diagnosis not present

## 2012-02-07 ENCOUNTER — Other Ambulatory Visit: Payer: Self-pay | Admitting: Pulmonary Disease

## 2012-02-20 ENCOUNTER — Other Ambulatory Visit: Payer: Self-pay | Admitting: Pulmonary Disease

## 2012-02-25 ENCOUNTER — Encounter: Payer: Self-pay | Admitting: Internal Medicine

## 2012-02-25 ENCOUNTER — Ambulatory Visit (INDEPENDENT_AMBULATORY_CARE_PROVIDER_SITE_OTHER): Payer: Medicare Other | Admitting: Internal Medicine

## 2012-02-25 VITALS — BP 166/74 | HR 71 | Temp 98.7°F | Wt 147.0 lb

## 2012-02-25 DIAGNOSIS — T8450XA Infection and inflammatory reaction due to unspecified internal joint prosthesis, initial encounter: Secondary | ICD-10-CM | POA: Diagnosis not present

## 2012-02-25 DIAGNOSIS — T8459XA Infection and inflammatory reaction due to other internal joint prosthesis, initial encounter: Secondary | ICD-10-CM

## 2012-02-25 DIAGNOSIS — Z96659 Presence of unspecified artificial knee joint: Secondary | ICD-10-CM

## 2012-02-25 NOTE — Progress Notes (Signed)
Patient ID: [name] [name], female   DOB: 1941/02/12, 71 y.o.   MRN: [id number]    [facility] Infectious Disease  Patient Active Problem List  Diagnosis  . DIABETES MELLITUS, TYPE II  . MIXED HYPERLIPIDEMIA  . ANXIETY  . SYNDROME, CHRONIC PAIN  . HYPERTENSION, UNSPECIFIED  . CORONARY ARTERY DISEASE  . PREMATURE VENTRICULAR CONTRACTIONS  . CEREBROVASCULAR DISEASE  . BRONCHITIS, ACUTE  . GASTROESOPHAGEAL REFLUX DISEASE  . DIVERTICULAR DISEASE  . IBS  . ACUTE CYSTITIS  . DEGENERATIVE JOINT DISEASE  . NECK PAIN, CHRONIC  . DIZZINESS, CHRONIC  . URINARY INCONTINENCE  . MITRAL VALVE PROLAPSE, HX OF  . Diarrhea  . Infection of prosthetic knee joint, left    Patient's Medications  New Prescriptions   No medications on file  Previous Medications   ALOE VERA 25 MG CAPS    Take 1 capsule by mouth 2 (two) times daily.     ASCORBIC ACID (VITAMIN C) 1000 MG TABLET    Take 1,000 mg by mouth daily.     ASPIRIN 81 MG TABLET    Take 81 mg by mouth daily.     CALCIUM CARBONATE (OS-CAL) 600 MG TABS    Take 600 mg by mouth 2 (two) times daily with a meal.     CHOLECALCIFEROL (VITAMIN D3) 1000 UNITS CAPS    Take 1 capsule by mouth daily.     CINNAMON 500 MG CAPSULE    Take 500 mg by mouth daily.     CLOPIDOGREL (PLAVIX) 75 MG TABLET    Take 1 tablet by mouth Daily. HOLD WHILE ON COUMADIN   COD LIVER OIL 1000 MG CAPS    Take 1 capsule by mouth 2 (two) times daily.     COENZYME Q10 (CO Q 10) 100 MG CAPS    Take 1 capsule by mouth daily.     DICLOFENAC (VOLTAREN) 75 MG EC TABLET    TAKE ONE TABLET BY MOUTH TWICE DAILY WITH FOOD   DICYCLOMINE (BENTYL) 10 MG CAPSULE    TAKE ONE CAPSULE BY MOUTH UP TO THREE TIMES DAILY AS NEEDED FOR ABDOMINAL CRAMPING   FLAXSEED, LINSEED, 1000 MG CAPS    Take 3 capsules by mouth 2 (two) times daily.     FUROSEMIDE (LASIX) 20 MG TABLET    Take 20 mg by mouth daily.   GARLIC 1000 MG CAPS    Take 2 capsules by mouth at bedtime.     GLIMEPIRIDE (AMARYL) 1 MG  TABLET    TAKE ONE TABLET BY MOUTH EVERY DAY IN THE MORNING   GLUCOSAMINE-CHONDROIT-VIT C-MN (GLUCOSAMINE CHONDR 1500 COMPLX PO)    Take 1 capsule by mouth 2 (two) times daily.     LOSARTAN (COZAAR) 100 MG TABLET    Take 100 mg by mouth daily.   METFORMIN (GLUCOPHAGE) 500 MG TABLET    TAKE ONE TABLET BY MOUTH TWICE DAILY   METHOCARBAMOL (ROBAXIN) 500 MG TABLET    Take 500 mg by mouth 4 (four) times daily as needed.   METOPROLOL (LOPRESSOR) 100 MG TABLET    Take 100 mg by mouth 2 (two) times daily.   MULTIPLE VITAMIN (STRESSTABS PO)    Take 1 tablet by mouth at bedtime.     NIACIN (NIASPAN) 500 MG CR TABLET    Take 1,000 mg by mouth at bedtime.   NITROGLYCERIN (NITROSTAT) 0.4 MG SL TABLET    Place 0.4 mg under the tongue every 5 (five) minutes as needed. For chest  pain   OMEGA-3 FATTY ACIDS (FISH OIL) 1000 MG CAPS    Take 1 capsule by mouth 4 (four) times daily.     OMEPRAZOLE (PRILOSEC) 20 MG CAPSULE    Take 40 mg by mouth daily.   PRAVASTATIN (PRAVACHOL) 40 MG TABLET    Take 40 mg by mouth every evening.   PRAVASTATIN (PRAVACHOL) 40 MG TABLET    TAKE ONE TABLET BY MOUTH EVERY EVENING   PREGABALIN (LYRICA) 75 MG CAPSULE    Take 75 mg by mouth 2 (two) times daily.   PSYLLIUM (REGULOID) 0.52 G CAPSULE    Take 0.52 g by mouth 2 (two) times daily.     RED YEAST RICE 600 MG CAPS    Take 1 capsule by mouth 2 (two) times daily.     TRAMADOL (ULTRAM) 50 MG TABLET    Take 1 tablet (50 mg total) by mouth 3 (three) times daily as needed for pain (can take with extra strength tylenol).   TRIMETHOPRIM (TRIMPEX) 100 MG TABLET    Take 1 tablet by mouth at bedtime.   VENLAFAXINE (EFFEXOR) 37.5 MG TABLET    Take 37.5 mg by mouth daily.   VENLAFAXINE (EFFEXOR) 37.5 MG TABLET    TAKE ONE TABLET BY MOUTH EVERY DAY   WARFARIN (COUMADIN) 5 MG TABLET    Take 1 tablet (5 mg total) by mouth daily.  Modified Medications   No medications on file  Discontinued Medications   No medications on file    Subjective: Mrs.  [name] is in for her routine followup visit. She completed 3 months of levofloxacin for her Klebsiella a left prosthetic knee infection on April 23. Dr. Cassell Smiles started her back on trimethoprim once daily for UTI prophylaxis one month ago but I note that the Klebsiella isolate from her knee was resistant to trimethoprim so she has now been off of antibiotics for about 2 months it would have any activity against Klebsiella. Her knee is doing much better. She is not having any pain in her knee but is having more neck and left arm pain and has been discussing further therapy diffuse her cervical spine with her neurosurgeon, Dr. Phoebe Perch.  Objective: Temp: 98.7 F (37.1 C) (06/24 1433) Temp src: Oral (06/24 1433) BP: 166/74 mmHg (06/24 1433) Pulse Rate: 71  (06/24 1433)  General: She is in good spirits Skin: No rash Lungs: Clear Cor: Regular S1 and S2 no murmurs Left knee: Her incision is well healed without any evidence of recurrent infection   Assessment: I strongly suspect that her prosthetic joint infection it has been cured. I will continue observation off of antibiotics.  Plan: 1. Continue observation off of antibiotics 2. Followup here as needed   Cliffton Asters, MD [name] for Infectious Disease [facility] [phone] pager   [phone] cell 02/25/2012, 2:52 PM

## 2012-02-26 ENCOUNTER — Encounter: Payer: Self-pay | Admitting: Physician Assistant

## 2012-02-26 ENCOUNTER — Ambulatory Visit (INDEPENDENT_AMBULATORY_CARE_PROVIDER_SITE_OTHER): Payer: Medicare Other | Admitting: Physician Assistant

## 2012-02-26 VITALS — BP 178/82 | HR 60 | Ht 61.0 in | Wt 146.0 lb

## 2012-02-26 DIAGNOSIS — I2510 Atherosclerotic heart disease of native coronary artery without angina pectoris: Secondary | ICD-10-CM

## 2012-02-26 DIAGNOSIS — I10 Essential (primary) hypertension: Secondary | ICD-10-CM | POA: Diagnosis not present

## 2012-02-26 DIAGNOSIS — M542 Cervicalgia: Secondary | ICD-10-CM | POA: Diagnosis not present

## 2012-02-26 DIAGNOSIS — E782 Mixed hyperlipidemia: Secondary | ICD-10-CM

## 2012-02-26 DIAGNOSIS — R079 Chest pain, unspecified: Secondary | ICD-10-CM

## 2012-02-26 MED ORDER — AMLODIPINE BESYLATE 5 MG PO TABS: 5.0000 mg | ORAL_TABLET | Freq: Every day | ORAL | Status: DC

## 2012-02-26 NOTE — Patient Instructions (Addendum)
Your physician has requested that you have a lexiscan myoview DX CAD. For further information please visit [email or web address] Please follow instruction sheet, as given.  PLEASE SEE DR. Excell Seltzer IN 09/2012  START AMLODIPINE 5 MG 1 TABLET DAILY

## 2012-02-26 NOTE — Progress Notes (Signed)
[address] [place], [place]  [address] Phone: [phone] Fax:  [phone]  Date:  02/26/2012   Name:  [name] [name]   DOB:  December 26, 1940   MRN:  [id number]  PCP:  Michele Mcalpine, MD  Primary Cardiologist:  Dr. Tonny Bollman  Primary Electrophysiologist:  None    History of Present Illness: [name] [name] is a 71 y.o. female who returns for surgical clearance.  She has a h/o CAD and initially presented with an inferior wall MI in 2007. She was treated with primary PCI utilizing a drug-eluting stent in the right coronary artery.  Echocardiogram 08/2007: EF 60%, mild MR, mild LAE.  Last LHC 08/2009: dLM 20%, LAD beyond diagonal 60-70%, stable from prior catheterization in 2007, ostial circumflex 40-50%, mid RCA stent okay, EF 65%.  Given the moderate stenosis in the LAD follow up myoview was recommended 08/2009: EF 76%, small inferoapical fixed defect, no ischemia.  She last saw Dr. Excell Seltzer 09/2011.  Her symptoms were stable at that time.  Plan was for one-year followup.  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 Phoebe Perch.  Of note, she has been followed by infectious disease for treatment of Klebsiella left prosthetic knee infection.  She had been on antibiotics for several weeks and was last seen by ID 6/24. She is now off antibiotics.  Date of her surgery has yet to be determined.  She does complain of a few episodes of chest pain.  This is left-sided and sharp.  She also feels pain up into her neck and her left arm.  It is likely this is coming from her cervical disc disease.  She does note decreased exercise tolerance.  She has dyspnea.  She describes Class 2 symptoms.  She denies orthopnea, PND or significant pedal edema.  She is somewhat limited by her knee pain.  She can do typical daily activities.  She does ride her  stationary bicycle for about 5 minutes each time.  She denies any associated dyspnea, nausea or diaphoresis with her chest pain.  She did take nitroglycerin about one week ago.  Wt Readings from Last 3 Encounters:  02/26/12 146 lb (66.225 kg)  02/25/12 147 lb (66.679 kg)  12/25/11 144 lb 4 oz (65.431 kg)     Potassium  Date/Time Value Range Status  09/26/2011  5:20 AM 5.2* 3.5 - 5.1 mEq/L Final     Creatinine, Ser  Date/Time Value Range Status  09/26/2011  5:20 AM 1.13* 0.50 - 1.10 mg/dL Final     ALT  Date/Time Value Range Status  09/25/2011 12:45 PM 30  0 - 35 U/L Final     TSH  Date/Time Value Range Status  10/13/2010  8:45 AM 4.51  0.35 - 5.50 uIU/mL Final     Hemoglobin  Date/Time Value Range Status  09/28/2011  5:36 AM 9.3* 12.0 - 15.0 g/dL Final    Past Medical History  Diagnosis Date  . HTN (hypertension)   . CAD (coronary artery disease)     1.  inf MI 2007 - tx with DES to RCA;  2.  Echocardiogram 08/2007: EF 60%, mild MR, mild LAE.  3.  Last LHC 08/2009: dLM 20%, LAD beyond diagonal 60-70%, stable from prior catheterization in 2007, ostial circumflex 40-50%, mid RCA stent okay, EF 65%. ;  4.  myoview was recommended 08/2009: EF 76%,  small inferoapical fixed defect, no ischemia    . Premature ventricular contractions   . Cerebrovascular disease, unspecified   . Mixed hyperlipidemia   . DM type 2 (diabetes mellitus, type 2)   . GERD (gastroesophageal reflux disease)   . Diverticular disease   . IBS (irritable bowel syndrome)   . Urinary incontinence   . DJD (degenerative joint disease)   . Chronic neck pain   . Chronic pain syndrome   . Dizziness   . Anxiety   . 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 Aloe Vera 25 MG CAPS Take 1 capsule by mouth 2 (two) times daily.        . Ascorbic Acid (VITAMIN C) 1000 MG tablet Take 1,000 mg by mouth daily.        Marland Kitchen aspirin 81 MG tablet  Take 81 mg by mouth daily.        . calcium carbonate (OS-CAL) 600 MG TABS Take 600 mg by mouth 2 (two) times daily with a meal.        . Cholecalciferol (VITAMIN D3) 1000 UNITS CAPS Take 1 capsule by mouth daily.        . Cinnamon 500 MG capsule Take 500 mg by mouth daily.        . clopidogrel (PLAVIX) 75 MG tablet Take 1 tablet by mouth Daily. HOLD WHILE ON COUMADIN      . Cod Liver Oil 1000 MG CAPS Take 1 capsule by mouth 2 (two) times daily.        . Coenzyme Q10 (CO Q 10) 100 MG CAPS Take 1 capsule by mouth daily.        . diclofenac (VOLTAREN) 75 MG EC tablet TAKE ONE TABLET BY MOUTH TWICE DAILY WITH FOOD  60 tablet  2  . dicyclomine (BENTYL) 10 MG capsule TAKE ONE CAPSULE BY MOUTH UP TO THREE TIMES DAILY AS NEEDED FOR ABDOMINAL CRAMPING  90 capsule  6  . Flaxseed, Linseed, 1000 MG CAPS Take 3 capsules by mouth 2 (two) times daily.        . furosemide (LASIX) 20 MG tablet Take 20 mg by mouth daily.      . Garlic 1000 MG CAPS Take 2 capsules by mouth at bedtime.        Marland Kitchen glimepiride (AMARYL) 1 MG tablet TAKE ONE TABLET BY MOUTH EVERY DAY IN THE MORNING  30 tablet  11  . Glucosamine-Chondroit-Vit C-Mn (GLUCOSAMINE CHONDR 1500 COMPLX PO) Take 1 capsule by mouth 2 (two) times daily.        Marland Kitchen losartan (COZAAR) 100 MG tablet Take 100 mg by mouth daily.      . metFORMIN (GLUCOPHAGE) 500 MG tablet TAKE ONE TABLET BY MOUTH TWICE DAILY  60 tablet  6  . methocarbamol (ROBAXIN) 500 MG tablet Take 500 mg by mouth 4 (four) times daily as needed.      . metoprolol (LOPRESSOR) 100 MG tablet Take 100 mg by mouth 2 (two) times daily.      . Multiple Vitamin (STRESSTABS PO) Take 1 tablet by mouth at bedtime.        . niacin (NIASPAN) 500 MG CR tablet Take 1,000 mg by mouth at bedtime.      . nitroGLYCERIN (NITROSTAT) 0.4 MG SL tablet Place 0.4 mg under the tongue every 5 (five) minutes as needed. For chest pain      . Omega-3 Fatty Acids (FISH  OIL) 1000 MG CAPS Take 1 capsule by mouth 4 (four) times daily.         Marland Kitchen omeprazole (PRILOSEC) 20 MG capsule Take 40 mg by mouth daily.      . pravastatin (PRAVACHOL) 40 MG tablet Take 40 mg by mouth every evening.      . pravastatin (PRAVACHOL) 40 MG tablet TAKE ONE TABLET BY MOUTH EVERY EVENING  30 tablet  11  . pregabalin (LYRICA) 75 MG capsule Take 75 mg by mouth 2 (two) times daily.      . psyllium (REGULOID) 0.52 G capsule Take 0.52 g by mouth 2 (two) times daily.        . Red Yeast Rice 600 MG CAPS Take 1 capsule by mouth 2 (two) times daily.        . traMADol (ULTRAM) 50 MG tablet Take 1 tablet (50 mg total) by mouth 3 (three) times daily as needed for pain (can take with extra strength tylenol).  90 tablet  5  . trimethoprim (TRIMPEX) 100 MG tablet Take 1 tablet by mouth at bedtime.      Marland Kitchen venlafaxine (EFFEXOR) 37.5 MG tablet Take 37.5 mg by mouth daily.      Marland Kitchen venlafaxine (EFFEXOR) 37.5 MG tablet TAKE ONE TABLET BY MOUTH EVERY DAY  30 tablet  6  . warfarin (COUMADIN) 5 MG tablet Take 1 tablet (5 mg total) by mouth daily.  30 tablet  1  . DISCONTD: enoxaparin (LOVENOX) 40 MG/0.4ML SOLN Inject 0.4 mLs (40 mg total) into the skin daily.  5 Syringe  0    Allergies: Allergies  Allergen Reactions  . Codeine     REACTION: nausea  . Prednisone     REACTION: rash  . Sulfonamide Derivatives     REACTION: rash  . Tetracycline     REACTION: rash    History  Substance Use Topics  . Smoking status: Never Smoker   . Smokeless tobacco: Never Used  . Alcohol Use: No     ROS:  Please see the history of present illness.   Notes occasional nausea.  Pain from neck is moderate.   All other systems reviewed and negative.   PHYSICAL EXAM: VS:  BP 178/82  Pulse 60  Ht 5\' 1"  (1.549 m)  Wt 146 lb (66.225 kg)  BMI 27.59 kg/m2 Well nourished, well developed, in no acute distress HEENT: normal Neck: no JVD Vascular: No carotid bruits Cardiac:  normal S1, S2; RRR; no murmur Lungs:  clear to auscultation bilaterally, no wheezing, rhonchi or rales Abd:  soft, nontender, no hepatomegaly Ext: no edema Skin: warm and dry Neuro:  CNs 2-12 intact, no focal abnormalities noted  EKG:  Sinus rhythm, heart 60, leftward axis, no acute changes   ASSESSMENT AND PLAN:  1.  Coronary artery disease She has had some chest pains recently.  She did take nitroglycerin once about a week ago.  Her functional status is somewhere around 4 METs.  She had moderate LAD stenosis in 2010 with a normal Myoview.  As it has been close to 2-1/2 years since her last nuclear study with her noted symptoms, I will arrange a Lexiscan Myoview.  As long as this is low risk, she can proceed with her surgery at acceptable risk.  Continue ASA and Plavix.  If her surgery can be performed while on ASA and Plavix this would be best.  If plavix needs to be held, this should be ok (if myoview low risk) as it has  been > 5 years since her PCI.  Continue beta blocker throughout perioperative period.  Our service is available as necessary.  Follow up with Dr. Tonny Bollman in 09/2012 as scheduled.  2.  Cervical disc disease Surgery pending as noted above.  3.  Hypertension Uncontrolled. Add amlodipine 5 mg QD.  4.  Hyperlipidemia Managed by PCP.    Signed, Tereso Newcomer, PA-C  11:14 AM 02/26/2012

## 2012-03-04 ENCOUNTER — Other Ambulatory Visit: Payer: Self-pay | Admitting: Pulmonary Disease

## 2012-03-05 ENCOUNTER — Ambulatory Visit (HOSPITAL_COMMUNITY): Payer: Medicare Other | Attending: Cardiology | Admitting: Radiology

## 2012-03-05 VITALS — BP 121/58 | HR 62 | Ht 61.0 in | Wt 142.0 lb

## 2012-03-05 DIAGNOSIS — R0609 Other forms of dyspnea: Secondary | ICD-10-CM | POA: Diagnosis not present

## 2012-03-05 DIAGNOSIS — R0602 Shortness of breath: Secondary | ICD-10-CM

## 2012-03-05 DIAGNOSIS — E119 Type 2 diabetes mellitus without complications: Secondary | ICD-10-CM

## 2012-03-05 DIAGNOSIS — R110 Nausea: Secondary | ICD-10-CM | POA: Diagnosis not present

## 2012-03-05 DIAGNOSIS — R000 Tachycardia, unspecified: Secondary | ICD-10-CM | POA: Diagnosis not present

## 2012-03-05 DIAGNOSIS — I10 Essential (primary) hypertension: Secondary | ICD-10-CM

## 2012-03-05 DIAGNOSIS — R079 Chest pain, unspecified: Secondary | ICD-10-CM | POA: Diagnosis not present

## 2012-03-05 DIAGNOSIS — Z8249 Family history of ischemic heart disease and other diseases of the circulatory system: Secondary | ICD-10-CM | POA: Insufficient documentation

## 2012-03-05 DIAGNOSIS — R5381 Other malaise: Secondary | ICD-10-CM | POA: Diagnosis not present

## 2012-03-05 DIAGNOSIS — R0989 Other specified symptoms and signs involving the circulatory and respiratory systems: Secondary | ICD-10-CM | POA: Insufficient documentation

## 2012-03-05 DIAGNOSIS — E785 Hyperlipidemia, unspecified: Secondary | ICD-10-CM | POA: Insufficient documentation

## 2012-03-05 DIAGNOSIS — R002 Palpitations: Secondary | ICD-10-CM | POA: Diagnosis not present

## 2012-03-05 DIAGNOSIS — I2510 Atherosclerotic heart disease of native coronary artery without angina pectoris: Secondary | ICD-10-CM

## 2012-03-05 MED ORDER — REGADENOSON 0.4 MG/5ML IV SOLN
0.4000 mg | Freq: Once | INTRAVENOUS | Status: AC
  Administered 2012-03-05: 0.4 mg via INTRAVENOUS

## 2012-03-05 MED ORDER — TECHNETIUM TC 99M TETROFOSMIN IV KIT
10.0000 | PACK | Freq: Once | INTRAVENOUS | Status: AC | PRN
  Administered 2012-03-05: 10 via INTRAVENOUS

## 2012-03-05 MED ORDER — TECHNETIUM TC 99M TETROFOSMIN IV KIT
30.0000 | PACK | Freq: Once | INTRAVENOUS | Status: AC | PRN
  Administered 2012-03-05: 30 via INTRAVENOUS

## 2012-03-05 NOTE — Progress Notes (Signed)
[facility] SITE 3 NUCLEAR MED [address] [place] [address] [phone]  Cardiology Nuclear Med Study  [name] [name] is a 70 y.o. female     MRN : [id number]     DOB: 1941/04/30  Procedure Date: 03/05/2012  Nuclear Med Background Indication for Stress Test:  Evaluation for Ischemia, PTCA/Stent Patency and Pending Clearance for Upcomming Cervical Surgery by Dr. Phoebe Perch, date TBD  History:  '07 IWMI>PTCA/Stent-RCA; 08/10/09 Cath:Patent stent, moderate CAD in LAD, EF=65%; 08/31/09 OZH:YQMVH infero-apical defect, no ischemia, EF=76%. Cardiac Risk Factors: Family History - CAD, Hypertension, Lipids and NIDDM  Symptoms:  Chest Pain>Neck/Left Arm with and without Exertion, DOE, Fatigue, Nausea, Palpitations and Rapid HR   Nuclear Pre-Procedure Caffeine/Decaff Intake:  None NPO After: 7:00pm   Lungs:  Clear. O2 Sat: 96% on room air. IV 0.9% NS with Angio Cath:  22g  IV Site: R Antecubital  IV Started by:  Stanton Kidney, EMT-P  Chest Size (in):  38 Cup Size: C  Height: 5\' 1"  (1.549 m)  Weight:  142 lb (64.411 kg)  BMI:  Body mass index is 26.83 kg/(m^2). Tech Comments:  NA    Nuclear Med Study 1 or 2 day study: 1 day  Stress Test Type:  Lexiscan  Reading MD: Willa Rough, MD  Order Authorizing Provider:  Tonny Bollman, MD; Tereso Newcomer, PA-C  Resting Radionuclide: Technetium 36m Tetrofosmin  Resting Radionuclide Dose: 11.0 mCi   Stress Radionuclide:  Technetium 62m Tetrofosmin  Stress Radionuclide Dose: 32.9 mCi           Stress Protocol Rest HR: 62 Stress HR: 90  Rest BP: 121/58 Stress BP: 145/63  Exercise Time (min): n/a METS: n/a   Predicted Max HR: 148 bpm % Max HR: 60.81 bpm Rate Pressure Product: 84696   Dose of Adenosine (mg):  n/a Dose of Lexiscan: 0.4 mg  Dose of Atropine (mg): n/a Dose of Dobutamine: n/a mcg/kg/min (at max HR)  Stress Test Technologist: Smiley Houseman, CMA-N  Nuclear Technologist:  Doyne Keel, CNMT     Rest Procedure:   Myocardial perfusion imaging was performed at rest 45 minutes following the intravenous administration of Technetium 58m Tetrofosmin.  Rest ECG: No acute changes  Stress Procedure:  The patient received IV Lexiscan 0.4 mg over 15-seconds.  Technetium 98m Tetrofosmin injected at 30-seconds.  There were no significant changes with Lexiscan.  Quantitative spect images were obtained after a 45 minute delay.  Stress ECG: No significant change from baseline ECG  QPS Raw Data Images:  Normal; no motion artifact; normal heart/lung ratio. Stress Images:  Normal homogeneous uptake in all areas of the myocardium. Rest Images:  Normal homogeneous uptake in all areas of the myocardium. Subtraction (SDS):  There is no evidence of scar or ischemia. Transient Ischemic Dilatation (Normal <1.22):  1.00 Lung/Heart Ratio (Normal <0.45):  0.26  Quantitative Gated Spect Images QGS EDV:  71 ml QGS ESV:  22 ml  Impression Exercise Capacity:  Lexiscan with no exercise. BP Response:  Normal blood pressure response. Clinical Symptoms:  Short of breath.  ECG Impression:  No significant ST segment change suggestive of ischemia. Comparison with Prior Nuclear Study: No images to compare  Overall Impression:  Normal stress nuclear study.  LV Ejection Fraction: 69%.  LV Wall Motion:  NL LV Function; NL Wall Motion  [name] 03/05/2012

## 2012-03-07 ENCOUNTER — Encounter: Payer: Self-pay | Admitting: Physician Assistant

## 2012-03-17 ENCOUNTER — Other Ambulatory Visit: Payer: Self-pay | Admitting: Pulmonary Disease

## 2012-03-24 DIAGNOSIS — M542 Cervicalgia: Secondary | ICD-10-CM | POA: Diagnosis not present

## 2012-03-25 ENCOUNTER — Encounter (HOSPITAL_COMMUNITY): Payer: Self-pay | Admitting: Pharmacy Technician

## 2012-03-25 ENCOUNTER — Other Ambulatory Visit: Payer: Self-pay | Admitting: Neurosurgery

## 2012-03-25 ENCOUNTER — Other Ambulatory Visit: Payer: Self-pay | Admitting: Pulmonary Disease

## 2012-04-02 ENCOUNTER — Ambulatory Visit (HOSPITAL_COMMUNITY)
Admission: RE | Admit: 2012-04-02 | Discharge: 2012-04-02 | Disposition: A | Discharge status: Home / Self Care | Payer: Medicare Other | Source: Ambulatory Visit | Attending: Neurosurgery | Admitting: Neurosurgery

## 2012-04-02 ENCOUNTER — Encounter (HOSPITAL_COMMUNITY)
Admission: RE | Admit: 2012-04-02 | Discharge: 2012-04-02 | Disposition: A | Discharge status: Home / Self Care | Payer: Medicare Other | Source: Ambulatory Visit | Attending: Neurosurgery | Admitting: Neurosurgery

## 2012-04-02 ENCOUNTER — Encounter (HOSPITAL_COMMUNITY): Payer: Self-pay

## 2012-04-02 DIAGNOSIS — Z01818 Encounter for other preprocedural examination: Secondary | ICD-10-CM | POA: Insufficient documentation

## 2012-04-02 DIAGNOSIS — Z01812 Encounter for preprocedural laboratory examination: Secondary | ICD-10-CM | POA: Insufficient documentation

## 2012-04-02 DIAGNOSIS — Z01811 Encounter for preprocedural respiratory examination: Secondary | ICD-10-CM | POA: Diagnosis not present

## 2012-04-02 HISTORY — DX: Personal history of other diseases of the digestive system: Z87.19

## 2012-04-02 HISTORY — DX: Chronic kidney disease, unspecified: N18.9

## 2012-04-02 HISTORY — DX: Nocturia: R35.1

## 2012-04-02 HISTORY — DX: Other specified postprocedural states: Z98.890

## 2012-04-02 HISTORY — DX: Acute pancreatitis without necrosis or infection, unspecified: K85.90

## 2012-04-02 HISTORY — DX: Nausea with vomiting, unspecified: R11.2

## 2012-04-02 HISTORY — DX: Nonrheumatic mitral (valve) prolapse: I34.1

## 2012-04-02 LAB — CBC
HCT: 40.7 % (ref 36.0–46.0)
Hemoglobin: 13.6 g/dL (ref 12.0–15.0)
MCH: 32.5 pg (ref 26.0–34.0)
MCHC: 33.4 g/dL (ref 30.0–36.0)
MCV: 97.4 fL (ref 78.0–100.0)
Platelets: 289 K/uL (ref 150–400)
RBC: 4.18 MIL/uL (ref 3.87–5.11)
RDW: 13.8 % (ref 11.5–15.5)
WBC: 10.6 K/uL — ABNORMAL HIGH (ref 4.0–10.5)

## 2012-04-02 LAB — SURGICAL PCR SCREEN
MRSA, PCR: NEGATIVE
Staphylococcus aureus: NEGATIVE

## 2012-04-02 LAB — BASIC METABOLIC PANEL
BUN: 18 mg/dL (ref 6–23)
CO2: 29 mEq/L (ref 19–32)
Chloride: 102 mEq/L (ref 96–112)
Creatinine, Ser: 0.87 mg/dL (ref 0.50–1.10)

## 2012-04-02 NOTE — Pre-Procedure Instructions (Addendum)
20 [name] [name]  04/02/2012   Your procedure is scheduled on:  Tuesday, August 6th.  Report to [facility] at 6:45 AM.  Call this number if you have problems the morning of surgery: [phone]   Remember:   Do not eat food or anything to drink:After Midnight.      Take these medicines the morning of surgery with A SIP OF WATER: Amolodine (Norvasc),Metoprolol (Lopressor), Omeprazole (Prilosec), Lyrica  And Effexor.  Stop taking NSAIDS (Diclofenac), Plavix, Coumadin, Effient and any Herbal medications.   Do not wear jewelry, make-up or nail polish.  Do not wear lotions, powders, or perfumes. You may wear deodorant.  Do not shave 48 hours prior to surgery. Men may shave face and neck.  Do not bring valuables to the hospital.  Contacts, dentures or bridgework may not be worn into surgery.  Leave suitcase in the car. After surgery it may be brought to your room.  For patients admitted to the hospital, checkout time is 11:00 AM the day of discharge.   Patients discharged the day of surgery will not be allowed to drive home.  Name and phone number of your driver: NA  Special Instructions: CHG Shower Use Special Wash: 1/2 bottle night before surgery and 1/2 bottle morning of surgery.   Please read over the following fact sheets that you were given: Pain Booklet, Coughing and Deep Breathing and Surgical Site Infection Prevention

## 2012-04-02 NOTE — Progress Notes (Signed)
Spoke with Judeth Cornfield at [facility] office and ask to have Dr Phoebe Perch to sign orders.

## 2012-04-08 ENCOUNTER — Encounter (HOSPITAL_COMMUNITY)
Admission: RE | Disposition: A | Discharge status: Home / Self Care | Payer: Self-pay | Source: Ambulatory Visit | Attending: Neurosurgery

## 2012-04-08 ENCOUNTER — Encounter (HOSPITAL_COMMUNITY): Payer: Self-pay | Admitting: Anesthesiology

## 2012-04-08 ENCOUNTER — Inpatient Hospital Stay (HOSPITAL_COMMUNITY)
Admission: RE | Admit: 2012-04-08 | Discharge: 2012-04-09 | DRG: 473 | Disposition: A | Discharge status: Home / Self Care | Payer: Medicare Other | Source: Ambulatory Visit | Attending: Neurosurgery | Admitting: Neurosurgery

## 2012-04-08 ENCOUNTER — Ambulatory Visit (HOSPITAL_COMMUNITY): Payer: Medicare Other

## 2012-04-08 ENCOUNTER — Ambulatory Visit (HOSPITAL_COMMUNITY): Payer: Medicare Other | Admitting: Anesthesiology

## 2012-04-08 DIAGNOSIS — M4712 Other spondylosis with myelopathy, cervical region: Secondary | ICD-10-CM | POA: Diagnosis not present

## 2012-04-08 DIAGNOSIS — M47812 Spondylosis without myelopathy or radiculopathy, cervical region: Secondary | ICD-10-CM | POA: Diagnosis not present

## 2012-04-08 DIAGNOSIS — M542 Cervicalgia: Secondary | ICD-10-CM | POA: Diagnosis not present

## 2012-04-08 DIAGNOSIS — Z981 Arthrodesis status: Secondary | ICD-10-CM | POA: Diagnosis not present

## 2012-04-08 DIAGNOSIS — M5412 Radiculopathy, cervical region: Secondary | ICD-10-CM | POA: Diagnosis not present

## 2012-04-08 DIAGNOSIS — I2510 Atherosclerotic heart disease of native coronary artery without angina pectoris: Secondary | ICD-10-CM

## 2012-04-08 HISTORY — PX: POSTERIOR CERVICAL FUSION/FORAMINOTOMY: SHX5038

## 2012-04-08 LAB — GLUCOSE, CAPILLARY: Glucose-Capillary: 123 mg/dL — ABNORMAL HIGH (ref 70–99)

## 2012-04-08 SURGERY — POSTERIOR CERVICAL FUSION/FORAMINOTOMY LEVEL 2
Anesthesia: General | Site: Neck | Laterality: Left | Wound class: Clean

## 2012-04-08 MED ORDER — CEFAZOLIN SODIUM 1-5 GM-% IV SOLN
INTRAVENOUS | Status: AC
  Administered 2012-04-08: 1 g via INTRAVENOUS
  Filled 2012-04-08: qty 50

## 2012-04-08 MED ORDER — NEOSTIGMINE METHYLSULFATE 1 MG/ML IJ SOLN
INTRAMUSCULAR | Status: DC | PRN
  Administered 2012-04-08: 4 mg via INTRAVENOUS

## 2012-04-08 MED ORDER — CEFAZOLIN SODIUM 1-5 GM-% IV SOLN
1.0000 g | Freq: Three times a day (TID) | INTRAVENOUS | Status: AC
  Administered 2012-04-08 – 2012-04-09 (×3): 1 g via INTRAVENOUS
  Filled 2012-04-08 (×3): qty 50

## 2012-04-08 MED ORDER — SODIUM CHLORIDE 0.9 % IJ SOLN: 3.0000 mL | INTRAMUSCULAR | Status: DC | PRN

## 2012-04-08 MED ORDER — CALCIUM CARBONATE 600 MG PO TABS
600.0000 mg | ORAL_TABLET | Freq: Two times a day (BID) | ORAL | Status: DC
  Filled 2012-04-08 (×2): qty 1

## 2012-04-08 MED ORDER — SODIUM CHLORIDE 0.9 % IR SOLN
Status: DC | PRN
  Administered 2012-04-08: 08:00:00

## 2012-04-08 MED ORDER — HYDROMORPHONE HCL PF 1 MG/ML IJ SOLN: 0.2500 mg | INTRAMUSCULAR | Status: DC | PRN

## 2012-04-08 MED ORDER — LIDOCAINE HCL (CARDIAC) 20 MG/ML IV SOLN
INTRAVENOUS | Status: DC | PRN
  Administered 2012-04-08: 50 mg via INTRAVENOUS

## 2012-04-08 MED ORDER — ISOPROPYL ALCOHOL 70 % SOLN
Status: DC | PRN
  Administered 2012-04-08: 1 via TOPICAL

## 2012-04-08 MED ORDER — HETASTARCH-ELECTROLYTES 6 % IV SOLN
INTRAVENOUS | Status: DC | PRN
  Administered 2012-04-08: 10:00:00 via INTRAVENOUS

## 2012-04-08 MED ORDER — DEXTROSE 5 % IV SOLN
INTRAVENOUS | Status: DC | PRN
  Administered 2012-04-08: 08:00:00 via INTRAVENOUS

## 2012-04-08 MED ORDER — LACTATED RINGERS IV SOLN
INTRAVENOUS | Status: DC | PRN
  Administered 2012-04-08: 08:00:00 via INTRAVENOUS

## 2012-04-08 MED ORDER — ACETAMINOPHEN 650 MG RE SUPP: 650.0000 mg | RECTAL | Status: DC | PRN

## 2012-04-08 MED ORDER — SCOPOLAMINE 1 MG/3DAYS TD PT72
MEDICATED_PATCH | TRANSDERMAL | Status: AC
  Filled 2012-04-08: qty 1

## 2012-04-08 MED ORDER — AMLODIPINE BESYLATE 5 MG PO TABS
5.0000 mg | ORAL_TABLET | Freq: Every day | ORAL | Status: DC
  Administered 2012-04-09: 5 mg via ORAL
  Filled 2012-04-08: qty 1

## 2012-04-08 MED ORDER — INSULIN ASPART 100 UNIT/ML ~~LOC~~ SOLN
0.0000 [IU] | SUBCUTANEOUS | Status: DC
  Administered 2012-04-08 (×2): 2 [IU] via SUBCUTANEOUS

## 2012-04-08 MED ORDER — GLYCOPYRROLATE 0.2 MG/ML IJ SOLN
INTRAMUSCULAR | Status: DC | PRN
  Administered 2012-04-08: .5 mg via INTRAVENOUS

## 2012-04-08 MED ORDER — METHOCARBAMOL 100 MG/ML IJ SOLN
500.0000 mg | Freq: Four times a day (QID) | INTRAVENOUS | Status: DC | PRN
  Administered 2012-04-08: 500 mg via INTRAVENOUS
  Filled 2012-04-08: qty 5

## 2012-04-08 MED ORDER — KETOROLAC TROMETHAMINE 30 MG/ML IJ SOLN
15.0000 mg | Freq: Four times a day (QID) | INTRAMUSCULAR | Status: DC
  Administered 2012-04-08 – 2012-04-09 (×3): 15 mg via INTRAVENOUS
  Filled 2012-04-08 (×4): qty 1

## 2012-04-08 MED ORDER — SODIUM CHLORIDE 0.9 % IV SOLN
INTRAVENOUS | Status: AC
  Filled 2012-04-08: qty 500

## 2012-04-08 MED ORDER — SIMVASTATIN 20 MG PO TABS
20.0000 mg | ORAL_TABLET | Freq: Every day | ORAL | Status: DC
  Administered 2012-04-08: 20 mg via ORAL
  Filled 2012-04-08 (×2): qty 1

## 2012-04-08 MED ORDER — ONDANSETRON HCL 4 MG/2ML IJ SOLN
INTRAMUSCULAR | Status: DC | PRN
  Administered 2012-04-08: 4 mg via INTRAVENOUS

## 2012-04-08 MED ORDER — THROMBIN 5000 UNITS EX SOLR
CUTANEOUS | Status: DC | PRN
  Administered 2012-04-08: 10000 [IU] via TOPICAL

## 2012-04-08 MED ORDER — ROCURONIUM BROMIDE 100 MG/10ML IV SOLN
INTRAVENOUS | Status: DC | PRN
  Administered 2012-04-08: 50 mg via INTRAVENOUS
  Administered 2012-04-08: 20 mg via INTRAVENOUS

## 2012-04-08 MED ORDER — METOPROLOL TARTRATE 100 MG PO TABS
100.0000 mg | ORAL_TABLET | Freq: Two times a day (BID) | ORAL | Status: DC
  Administered 2012-04-09: 100 mg via ORAL
  Filled 2012-04-08 (×3): qty 1

## 2012-04-08 MED ORDER — INSULIN ASPART 100 UNIT/ML ~~LOC~~ SOLN: 0.0000 [IU] | Freq: Every day | SUBCUTANEOUS | Status: DC

## 2012-04-08 MED ORDER — SCOPOLAMINE 1 MG/3DAYS TD PT72
MEDICATED_PATCH | TRANSDERMAL | Status: DC | PRN
  Administered 2012-04-08: 1 via TRANSDERMAL

## 2012-04-08 MED ORDER — PANTOPRAZOLE SODIUM 40 MG PO TBEC
40.0000 mg | DELAYED_RELEASE_TABLET | Freq: Every day | ORAL | Status: DC
  Administered 2012-04-09: 40 mg via ORAL
  Filled 2012-04-08: qty 1

## 2012-04-08 MED ORDER — TRIMETHOPRIM 100 MG PO TABS
100.0000 mg | ORAL_TABLET | Freq: Every day | ORAL | Status: DC
  Administered 2012-04-08: 100 mg via ORAL
  Filled 2012-04-08 (×2): qty 1

## 2012-04-08 MED ORDER — OXYCODONE-ACETAMINOPHEN 5-325 MG PO TABS: 1.0000 | ORAL_TABLET | ORAL | Status: DC | PRN

## 2012-04-08 MED ORDER — HYDROCODONE-ACETAMINOPHEN 5-325 MG PO TABS
1.0000 | ORAL_TABLET | ORAL | Status: DC | PRN
  Administered 2012-04-08 – 2012-04-09 (×2): 2 via ORAL
  Filled 2012-04-08 (×2): qty 2

## 2012-04-08 MED ORDER — DOCUSATE SODIUM 100 MG PO CAPS
100.0000 mg | ORAL_CAPSULE | Freq: Two times a day (BID) | ORAL | Status: DC
  Administered 2012-04-08 – 2012-04-09 (×2): 100 mg via ORAL
  Filled 2012-04-08 (×2): qty 1

## 2012-04-08 MED ORDER — FENTANYL CITRATE 0.05 MG/ML IJ SOLN
INTRAMUSCULAR | Status: DC | PRN
  Administered 2012-04-08: 50 ug via INTRAVENOUS
  Administered 2012-04-08: 100 ug via INTRAVENOUS
  Administered 2012-04-08: 50 ug via INTRAVENOUS
  Administered 2012-04-08 (×2): 100 ug via INTRAVENOUS

## 2012-04-08 MED ORDER — MORPHINE SULFATE 2 MG/ML IJ SOLN
1.0000 mg | INTRAMUSCULAR | Status: DC | PRN
  Administered 2012-04-08: 2 mg via INTRAVENOUS
  Filled 2012-04-08: qty 1

## 2012-04-08 MED ORDER — NITROGLYCERIN 0.4 MG SL SUBL: 0.4000 mg | SUBLINGUAL_TABLET | SUBLINGUAL | Status: DC | PRN

## 2012-04-08 MED ORDER — SODIUM CHLORIDE 0.9 % IJ SOLN
3.0000 mL | Freq: Two times a day (BID) | INTRAMUSCULAR | Status: DC
  Administered 2012-04-08 (×2): 3 mL via INTRAVENOUS

## 2012-04-08 MED ORDER — HEMOSTATIC AGENTS (NO CHARGE) OPTIME
TOPICAL | Status: DC | PRN
  Administered 2012-04-08: 1 via TOPICAL

## 2012-04-08 MED ORDER — LACTATED RINGERS IV SOLN
INTRAVENOUS | Status: DC
  Administered 2012-04-08: 18:00:00 via INTRAVENOUS

## 2012-04-08 MED ORDER — BISACODYL 10 MG RE SUPP: 10.0000 mg | Freq: Every day | RECTAL | Status: DC | PRN

## 2012-04-08 MED ORDER — ACETAMINOPHEN 325 MG PO TABS: 650.0000 mg | ORAL_TABLET | ORAL | Status: DC | PRN

## 2012-04-08 MED ORDER — FUROSEMIDE 20 MG PO TABS
20.0000 mg | ORAL_TABLET | Freq: Every day | ORAL | Status: DC
  Administered 2012-04-08 – 2012-04-09 (×2): 20 mg via ORAL
  Filled 2012-04-08 (×2): qty 1

## 2012-04-08 MED ORDER — GLIMEPIRIDE 1 MG PO TABS
1.0000 mg | ORAL_TABLET | Freq: Every day | ORAL | Status: DC
  Administered 2012-04-09: 1 mg via ORAL
  Filled 2012-04-08 (×2): qty 1

## 2012-04-08 MED ORDER — METHOCARBAMOL 500 MG PO TABS
500.0000 mg | ORAL_TABLET | Freq: Four times a day (QID) | ORAL | Status: DC | PRN
Start: 2012-04-08 — End: 2012-04-09

## 2012-04-08 MED ORDER — PROPOFOL 10 MG/ML IV EMUL
INTRAVENOUS | Status: DC | PRN
  Administered 2012-04-08: 120 mg via INTRAVENOUS

## 2012-04-08 MED ORDER — PROMETHAZINE HCL 25 MG/ML IJ SOLN
12.5000 mg | INTRAMUSCULAR | Status: DC | PRN
  Administered 2012-04-08: 12.5 mg via INTRAVENOUS
  Filled 2012-04-08: qty 1

## 2012-04-08 MED ORDER — MIDAZOLAM HCL 5 MG/5ML IJ SOLN
INTRAMUSCULAR | Status: DC | PRN
  Administered 2012-04-08: 2 mg via INTRAVENOUS

## 2012-04-08 MED ORDER — SODIUM CHLORIDE 0.9 % IV SOLN
INTRAVENOUS | Status: DC | PRN
  Administered 2012-04-08 (×2): via INTRAVENOUS

## 2012-04-08 MED ORDER — PROMETHAZINE HCL 25 MG PO TABS: 12.5000 mg | ORAL_TABLET | ORAL | Status: DC | PRN

## 2012-04-08 MED ORDER — CALCIUM CARBONATE 1250 (500 CA) MG PO TABS
1.0000 | ORAL_TABLET | Freq: Two times a day (BID) | ORAL | Status: DC
  Administered 2012-04-08 – 2012-04-09 (×2): 500 mg via ORAL
  Filled 2012-04-08 (×4): qty 1

## 2012-04-08 MED ORDER — METFORMIN HCL 500 MG PO TABS
500.0000 mg | ORAL_TABLET | Freq: Two times a day (BID) | ORAL | Status: DC
  Administered 2012-04-08 – 2012-04-09 (×2): 500 mg via ORAL
  Filled 2012-04-08 (×4): qty 1

## 2012-04-08 MED ORDER — BACITRACIN 50000 UNITS IM SOLR
INTRAMUSCULAR | Status: AC
  Filled 2012-04-08: qty 1

## 2012-04-08 MED ORDER — CYCLOBENZAPRINE HCL 10 MG PO TABS
10.0000 mg | ORAL_TABLET | Freq: Three times a day (TID) | ORAL | Status: DC | PRN
  Administered 2012-04-08: 10 mg via ORAL
  Filled 2012-04-08: qty 1

## 2012-04-08 MED ORDER — MIDAZOLAM HCL 2 MG/2ML IJ SOLN: 0.5000 mg | Freq: Once | INTRAMUSCULAR | Status: DC | PRN

## 2012-04-08 MED ORDER — INSULIN ASPART 100 UNIT/ML ~~LOC~~ SOLN
0.0000 [IU] | Freq: Three times a day (TID) | SUBCUTANEOUS | Status: DC
  Administered 2012-04-09: 2 [IU] via SUBCUTANEOUS

## 2012-04-08 MED ORDER — NIACIN ER (ANTIHYPERLIPIDEMIC) 500 MG PO TBCR
1000.0000 mg | EXTENDED_RELEASE_TABLET | Freq: Every day | ORAL | Status: DC
  Administered 2012-04-08: 1000 mg via ORAL
  Filled 2012-04-08 (×2): qty 2

## 2012-04-08 MED ORDER — ONDANSETRON HCL 4 MG/2ML IJ SOLN: 4.0000 mg | INTRAMUSCULAR | Status: DC | PRN

## 2012-04-08 MED ORDER — PROMETHAZINE HCL 25 MG/ML IJ SOLN: 6.2500 mg | INTRAMUSCULAR | Status: DC | PRN

## 2012-04-08 MED ORDER — MAGNESIUM HYDROXIDE 400 MG/5ML PO SUSP: 30.0000 mL | Freq: Every day | ORAL | Status: DC | PRN

## 2012-04-08 MED ORDER — PREGABALIN 50 MG PO CAPS
75.0000 mg | ORAL_CAPSULE | Freq: Two times a day (BID) | ORAL | Status: DC
  Administered 2012-04-08 – 2012-04-09 (×2): 75 mg via ORAL
  Filled 2012-04-08 (×2): qty 1

## 2012-04-08 MED ORDER — LIDOCAINE-EPINEPHRINE 1 %-1:100000 IJ SOLN
INTRAMUSCULAR | Status: DC | PRN
  Administered 2012-04-08: 20 mL

## 2012-04-08 MED ORDER — LOSARTAN POTASSIUM 50 MG PO TABS
100.0000 mg | ORAL_TABLET | Freq: Every day | ORAL | Status: DC
  Administered 2012-04-08 – 2012-04-09 (×2): 100 mg via ORAL
  Filled 2012-04-08 (×2): qty 2

## 2012-04-08 MED ORDER — MEPERIDINE HCL 25 MG/ML IJ SOLN: 6.2500 mg | INTRAMUSCULAR | Status: DC | PRN

## 2012-04-08 MED ORDER — METFORMIN HCL 500 MG PO TABS
500.0000 mg | ORAL_TABLET | Freq: Two times a day (BID) | ORAL | Status: DC
  Filled 2012-04-08 (×2): qty 1

## 2012-04-08 MED ORDER — 0.9 % SODIUM CHLORIDE (POUR BTL) OPTIME
TOPICAL | Status: DC | PRN
  Administered 2012-04-08: 1000 mL

## 2012-04-08 MED ORDER — ZOLPIDEM TARTRATE 5 MG PO TABS: 5.0000 mg | ORAL_TABLET | Freq: Every evening | ORAL | Status: DC | PRN

## 2012-04-08 SURGICAL SUPPLY — 61 items
BAG DECANTER FOR FLEXI CONT (MISCELLANEOUS) ×2 IMPLANT
BENZOIN TINCTURE PRP APPL 2/3 (GAUZE/BANDAGES/DRESSINGS) ×2 IMPLANT
BIT DRILL NEURO 2X3.1 SFT TUCH (MISCELLANEOUS) ×1 IMPLANT
BONE VOID FILLER STRIP 10CC (Bone Implant) ×4 IMPLANT
BUR MATCHSTICK NEURO 3.0 HP (BURR) ×2 IMPLANT
CANISTER SUCTION 2500CC (MISCELLANEOUS) ×2 IMPLANT
CLOTH BEACON ORANGE TIMEOUT ST (SAFETY) ×2 IMPLANT
CONT SPEC 4OZ CLIKSEAL STRL BL (MISCELLANEOUS) ×2 IMPLANT
DRAIN SNY WOU 7FLT (WOUND CARE) IMPLANT
DRAPE C-ARM 42X72 X-RAY (DRAPES) ×6 IMPLANT
DRAPE LAPAROTOMY 100X72 PEDS (DRAPES) ×2 IMPLANT
DRAPE MICROSCOPE LEICA (MISCELLANEOUS) ×2 IMPLANT
DRAPE POUCH INSTRU U-SHP 10X18 (DRAPES) ×2 IMPLANT
DRILL NEURO 2X3.1 SOFT TOUCH (MISCELLANEOUS) ×2
DURAPREP 6ML APPLICATOR 50/CS (WOUND CARE) ×2 IMPLANT
ELECT REM PT RETURN 9FT ADLT (ELECTROSURGICAL) ×2
ELECTRODE REM PT RTRN 9FT ADLT (ELECTROSURGICAL) ×1 IMPLANT
EVACUATOR SILICONE 100CC (DRAIN) IMPLANT
GAUZE SPONGE 4X4 16PLY XRAY LF (GAUZE/BANDAGES/DRESSINGS) IMPLANT
GLOVE BIOGEL PI IND STRL 7.0 (GLOVE) ×2 IMPLANT
GLOVE BIOGEL PI INDICATOR 7.0 (GLOVE) ×2
GLOVE ECLIPSE 7.5 STRL STRAW (GLOVE) ×8 IMPLANT
GLOVE EXAM NITRILE LRG STRL (GLOVE) IMPLANT
GLOVE EXAM NITRILE MD LF STRL (GLOVE) IMPLANT
GLOVE EXAM NITRILE XL STR (GLOVE) IMPLANT
GLOVE EXAM NITRILE XS STR PU (GLOVE) IMPLANT
GLOVE SURG SS PI 6.5 STRL IVOR (GLOVE) ×6 IMPLANT
GOWN BRE IMP SLV AUR LG STRL (GOWN DISPOSABLE) ×6 IMPLANT
GOWN BRE IMP SLV AUR XL STRL (GOWN DISPOSABLE) ×6 IMPLANT
GOWN STRL REIN 2XL LVL4 (GOWN DISPOSABLE) IMPLANT
KIT BASIN OR (CUSTOM PROCEDURE TRAY) ×2 IMPLANT
KIT ROOM TURNOVER OR (KITS) ×2 IMPLANT
NEEDLE HYPO 18GX1.5 BLUNT FILL (NEEDLE) IMPLANT
NEEDLE HYPO 22GX1.5 SAFETY (NEEDLE) ×4 IMPLANT
NEEDLE SPNL 22GX3.5 QUINCKE BK (NEEDLE) ×2 IMPLANT
NS IRRIG 1000ML POUR BTL (IV SOLUTION) ×2 IMPLANT
PACK LAMINECTOMY NEURO (CUSTOM PROCEDURE TRAY) ×2 IMPLANT
PAD ARMBOARD 7.5X6 YLW CONV (MISCELLANEOUS) ×4 IMPLANT
PATTIES SURGICAL .25X.25 (GAUZE/BANDAGES/DRESSINGS) IMPLANT
PATTIES SURGICAL .75X.75 (GAUZE/BANDAGES/DRESSINGS) ×2 IMPLANT
ROD 120MM (Rod) ×1 IMPLANT
ROD SPNL 120X3.3XNS LF CVD (Rod) ×1 IMPLANT
RUBBERBAND STERILE (MISCELLANEOUS) ×4 IMPLANT
SCREW POLYAXIAL 12MM (Screw) ×6 IMPLANT
SCREW SET (Screw) ×2 IMPLANT
SCREW SPINAL STRL 3.5DIAX8MM (Screw) ×4 IMPLANT
SPONGE GAUZE 4X4 12PLY (GAUZE/BANDAGES/DRESSINGS) ×2 IMPLANT
STRIP CLOSURE SKIN 1/2X4 (GAUZE/BANDAGES/DRESSINGS) ×4 IMPLANT
SUT NURALON 4 0 TR CR/8 (SUTURE) IMPLANT
SUT VIC AB 0 CT1 18XCR BRD8 (SUTURE) ×2 IMPLANT
SUT VIC AB 0 CT1 8-18 (SUTURE) ×2
SUT VIC AB 2-0 CP2 18 (SUTURE) ×2 IMPLANT
SUT VIC AB 3-0 SH 8-18 (SUTURE) ×4 IMPLANT
SYR 20ML ECCENTRIC (SYRINGE) ×2 IMPLANT
SYR 3ML LL SCALE MARK (SYRINGE) IMPLANT
SYRINGE 10CC LL (SYRINGE) ×2 IMPLANT
TAPE CLOTH SURG 4X10 WHT LF (GAUZE/BANDAGES/DRESSINGS) ×2 IMPLANT
TOWEL OR 17X24 6PK STRL BLUE (TOWEL DISPOSABLE) ×2 IMPLANT
TOWEL OR 17X26 10 PK STRL BLUE (TOWEL DISPOSABLE) ×2 IMPLANT
TRAY FOLEY CATH 14FRSI W/METER (CATHETERS) ×2 IMPLANT
WATER STERILE IRR 1000ML POUR (IV SOLUTION) ×2 IMPLANT

## 2012-04-08 NOTE — H&P (Signed)
See H& P.

## 2012-04-08 NOTE — Interval H&P Note (Signed)
History and Physical Interval Note:  04/08/2012 7:32 AM  [name] [name]  has presented today for surgery, with the diagnosis of Cervicalgia  The various methods of treatment have been discussed with the patient and family. After consideration of risks, benefits and other options for treatment, the patient has consented to  Procedure(s) (LRB): POSTERIOR CERVICAL FUSION/FORAMINOTOMY LEVEL 2 (Left) as a surgical intervention .  The patient's history has been reviewed, patient examined, no change in status, stable for surgery.  I have reviewed the patient's chart and labs.  Questions were answered to the patient's satisfaction.     [name]

## 2012-04-08 NOTE — Anesthesia Preprocedure Evaluation (Addendum)
Anesthesia Evaluation  Patient identified by MRN, date of birth, ID band Patient awake    Reviewed: Allergy & Precautions, H&P , NPO status , Patient's Chart, lab work & pertinent test results, reviewed documented beta blocker date and time   History of Anesthesia Complications (+) PONV  Airway Mallampati: III TM Distance: >3 FB Neck ROM: Limited  Mouth opening: Limited Mouth Opening  Dental No notable dental hx. (+) Teeth Intact and Dental Advisory Given   Pulmonary neg pulmonary ROS,  breath sounds clear to auscultation  Pulmonary exam normal       Cardiovascular hypertension, Pt. on medications and Pt. on home beta blockers + CAD and + Cardiac Stents + dysrhythmias Rhythm:Regular Rate:Normal  7/13 myoview:  Normal LVF without ischemia, EF 69%   Neuro/Psych Anxiety    GI/Hepatic Neg liver ROS, hiatal hernia, GERD-  Medicated and Controlled,  Endo/Other  Type 2, Oral Hypoglycemic Agents  Renal/GU negative Renal ROS     Musculoskeletal  (+) Arthritis -, Osteoarthritis,    Abdominal (+) + obese,   Peds  Hematology negative hematology ROS (+)   Anesthesia Other Findings   Reproductive/Obstetrics                        Anesthesia Physical Anesthesia Plan  ASA: III  Anesthesia Plan: General   Post-op Pain Management:    Induction: Intravenous  Airway Management Planned: Oral ETT  Additional Equipment:   Intra-op Plan:   Post-operative Plan: Extubation in OR  Informed Consent: I have reviewed the patients History and Physical, chart, labs and discussed the procedure including the risks, benefits and alternatives for the proposed anesthesia with the patient or authorized representative who has indicated his/her understanding and acceptance.   Dental advisory given  Plan Discussed with: Surgeon and CRNA  Anesthesia Plan Comments: (Plan routine monitors, GETA)       Anesthesia  Quick Evaluation

## 2012-04-08 NOTE — Op Note (Signed)
04/08/2012  11:33 AM  PATIENT:  [name] [name]  71 y.o. female  PRE-OPERATIVE DIAGNOSIS:  Cervicalgia, C6-7/ C7T1  Non unionion, spodylosis with radiculopathy  POST-OPERATIVE DIAGNOSIS:  Cervicalgia, C6-7/ C7T1  Non unionion, spodylosis with radiculopathy  PROCEDURE:  Procedure(s): POSTERIOR CERVICAL decompressive  foremenotomies, Left C6-7 , bilateral C7T1  posterior cervical fusion C6-T1  , segmented lateral mass/pedicle screw fixation C6-T1 , autograft, allograft  SURGEON:  Surgeon(s): Clydene Fake, MD Rolanda Lundborg Kritzer, MD-assist    ANESTHESIA:   general  EBL:  Total I/O In: 2350 [I.V.:2100; IV Piggyback:250] Out: 750 [Urine:575; Blood:175]  BLOOD ADMINISTERED:none  DRAINS: none   SPECIMEN:  No Specimen  DICTATION: Patient has had multiple surgeries last few years ago the ACDF 161096 for previous nonunion some of her progression of spondylosis patient was improved but never did get for fusion at these levels as at persistent neck pain and left arm symptoms which have been worsening workup to nonunion with and Provera C6-7 portal from the glenohumeral protuberant decompression posterior fusion T6-T1.  The foramina program she is requesting to have pins and position proper point padded. V. fashion 17-20 cc of lidocaine with epinephrine. Incision was made is a previous posterior surgery with seen extending in the midline inferiorly into fashion (periosteal dissection of but he was lamina were done on the left side. We used fluoroscopy to confirm our position weeks C6 and C7-T1 lamina and lateral masses. We then did this on the opposite side and placed obtaining retractors. The wounds were screws into C6 and C7 using all tumor marker was Allegra 2012 tapping the holes. Linda C6-7 temperature 1 and a drop Kerrison punch microscope and microdissection initial attempt and laterally over the nerve root to decompression the nerve... Motrin I bilaterally at C7-T1 I drilled her splint is  decompression in the lateral recess at this level this to subcutaneous pedicle. We then they are who has been drilled over the winter point personal and the right were drilled the T1 pedicle and subcutaneous wife will and the pedicle. These holes were symmetrically good coverage both holes and 18 mm screws were then placed into the T1 pedicle and 12 mm screws placed into the C6 lateral masses the left [facility] close to T1 screw through the plate we were able to place right-sided C7. Proper placement the screws and locking nuts placed and tightened. The high-speed drill to decorticate brochure lateral masses of C6-C7 and T1 and we do a drill to drill into the facet joints at these levels. Autograft bone was used during the head laminotomies the spaces and allograft was then packed posteriorly for posterior fusion bilaterally C6-T1. Hemostasis retractors removed to 0 Vicryl interrupted suture subcutaneous tissue closed with a 2 and 3) are pleased interrupted suture skin closed with benzoin Steri-Strips dressed with a space was placed back into a position in a cervical collar placed  PLAN OF CARE: Admit to inpatient   PATIENT DISPOSITION:  PACU - hemodynamically stable.

## 2012-04-08 NOTE — OR Nursing (Signed)
0740 History and Physical on paper on the chart.

## 2012-04-08 NOTE — Plan of Care (Signed)
Problem: Consults Goal: Diagnosis - Spinal Surgery Outcome: Completed/Met Date Met:  04/08/12 Cervical Spine Fusion

## 2012-04-08 NOTE — Anesthesia Postprocedure Evaluation (Signed)
  Anesthesia Post-op Note  Patient: [name] [name]  Procedure(s) Performed: Procedure(s) (LRB): POSTERIOR CERVICAL FUSION/FORAMINOTOMY LEVEL 2 (Left)  Patient Location: PACU  Anesthesia Type: General  Level of Consciousness: sedated  Airway and Oxygen Therapy: Patient Spontanous Breathing and Patient connected to nasal cannula oxygen  Post-op Pain: mild  Post-op Assessment: Post-op Vital signs reviewed, Patient's Cardiovascular Status Stable, Respiratory Function Stable, Patent Airway, No signs of Nausea or vomiting and Pain level controlled  Post-op Vital Signs: Reviewed and stable  Complications: No apparent anesthesia complications

## 2012-04-08 NOTE — Preoperative (Signed)
Beta Blockers   Reason not to administer Beta Blockers:Not Applicable, taken 0430am per patient

## 2012-04-08 NOTE — Anesthesia Procedure Notes (Signed)
Procedure Name: Intubation Date/Time: 04/08/2012 8:00 AM Performed by: Brandyce Dimario S Pre-anesthesia Checklist: Patient identified, Emergency Drugs available, Suction available, Patient being monitored and Timeout performed Patient Re-evaluated:Patient Re-evaluated prior to inductionOxygen Delivery Method: Circle system utilized Preoxygenation: Pre-oxygenation with 100% oxygen Intubation Type: IV induction Ventilation: Mask ventilation without difficulty Tube size: 7.5 mm Number of attempts: 1 Airway Equipment and Method: Video-laryngoscopy and Stylet Placement Confirmation: ETT inserted through vocal cords under direct vision,  positive ETCO2 and breath sounds checked- equal and bilateral Secured at: 22 cm Tube secured with: Tape Dental Injury: Teeth and Oropharynx as per pre-operative assessment  Comments: Glidescope used first.

## 2012-04-08 NOTE — Transfer of Care (Signed)
Immediate Anesthesia Transfer of Care Note  Patient: [name] [name]  Procedure(s) Performed: Procedure(s) (LRB): POSTERIOR CERVICAL FUSION/FORAMINOTOMY LEVEL 2 (Left)  Patient Location: PACU  Anesthesia Type: General  Level of Consciousness: awake, alert  and oriented  Airway & Oxygen Therapy: Patient Spontanous Breathing and Patient connected to nasal cannula oxygen  Post-op Assessment: Report given to PACU RN and Post -op Vital signs reviewed and stable  Post vital signs: Reviewed and stable  Complications: No apparent anesthesia complications

## 2012-04-08 NOTE — Progress Notes (Signed)
UR COMPLETED  

## 2012-04-09 ENCOUNTER — Encounter (HOSPITAL_COMMUNITY): Payer: Self-pay | Admitting: Neurosurgery

## 2012-04-09 LAB — GLUCOSE, CAPILLARY: Glucose-Capillary: 124 mg/dL — ABNORMAL HIGH (ref 70–99)

## 2012-04-09 MED ORDER — HYDROCODONE-ACETAMINOPHEN 5-325 MG PO TABS: 1.0000 | ORAL_TABLET | ORAL | Status: AC | PRN

## 2012-04-09 MED ORDER — METHOCARBAMOL 500 MG PO TABS: 500.0000 mg | ORAL_TABLET | Freq: Four times a day (QID) | ORAL | Status: AC | PRN

## 2012-04-09 NOTE — Discharge Summary (Signed)
Physician Discharge Summary  Patient ID: [name] [name] MRN: [id number] DOB/AGE: 09/22/1940 71 y.o.  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 *    Discharged Condition: good  Hospital Course: pt admitted on day of surgery, underwent procedure below- pt doing well, less arm symtoms, ambulating, eating, voiding  Consults: None  Significant Diagnostic Studies: none  Treatments: surgery: POSTERIOR CERVICAL decompressive foremenotomies, Left C6-7 , bilateral C7T1 posterior cervical fusion C6-T1 , segmented lateral mass/pedicle screw fixation C6-T1 , autograft,    Discharge Exam: Blood pressure 120/64, pulse 89, temperature 100.8 F (38.2 C), temperature source Oral, resp. rate 16, SpO2 93.00%. Wound:c/d/i  Disposition: home  Discharge Orders    Future Appointments: Provider: Department: Dept Phone: Center:   04/29/2012 9:30 AM Michele Mcalpine, MD [facility] [phone] None     Medication List  As of 04/09/2012  7:55 AM   STOP taking these medications         diclofenac 75 MG EC tablet         TAKE these medications         Aloe Vera 25 MG Caps   Take 1 capsule by mouth 2 (two) times daily.      amLODipine 5 MG tablet   Commonly known as: NORVASC   Take 1 tablet (5 mg total) by mouth daily.      aspirin EC 81 MG tablet   Take 81 mg by mouth daily.      calcium carbonate 600 MG Tabs   Commonly known as: OS-CAL   Take 600 mg by mouth 2 (two) times daily with a meal.      Cinnamon 500 MG capsule   Take 500 mg by mouth 2 (two) times daily.      clopidogrel 75 MG tablet   Commonly known as: PLAVIX   Take 1 tablet by mouth Daily. HOLD WHILE ON COUMADIN      Co Q 10 100 MG Caps   Take 1 capsule by mouth daily.      Cod Liver  Oil 1000 MG Caps   Take 1 capsule by mouth 2 (two) times daily.      dicyclomine 10 MG capsule   Commonly known as: BENTYL   TAKE ONE CAPSULE BY MOUTH UP TO THREE TIMES DAILY AS NEEDED FOR ABDOMINAL CRAMPING      Fish Oil 1000 MG Caps   Take 1 capsule by mouth 4 (four) times daily.      Flaxseed (Linseed) 1000 MG Caps   Take 1 capsule by mouth 2 (two) times daily.      furosemide 20 MG tablet   Commonly known as: LASIX   Take 20 mg by mouth daily.      Garlic 1000 MG Caps   Take 2 capsules by mouth at bedtime.      glimepiride 1 MG tablet   Commonly known as: AMARYL   TAKE ONE TABLET BY MOUTH EVERY DAY IN THE MORNING      GLUCOSAMINE CHONDR 1500 COMPLX PO   Take 1 capsule by mouth 2 (two) times daily.      HYDROcodone-acetaminophen 5-325 MG per tablet   Commonly known as: NORCO/VICODIN   Take 1-2 tablets by mouth every 4 (four) hours as needed for pain.      losartan 100 MG tablet  Commonly known as: COZAAR   Take 100 mg by mouth daily.      metFORMIN 500 MG tablet   Commonly known as: GLUCOPHAGE   TAKE ONE TABLET BY MOUTH TWICE DAILY      methocarbamol 500 MG tablet   Commonly known as: ROBAXIN   Take 500 mg by mouth 4 (four) times daily as needed. For pain/ muscle spasms      methocarbamol 500 MG tablet   Commonly known as: ROBAXIN   Take 1 tablet (500 mg total) by mouth every 6 (six) hours as needed (spasms).      metoprolol 100 MG tablet   Commonly known as: LOPRESSOR   Take 100 mg by mouth 2 (two) times daily.      niacin 500 MG CR tablet   Commonly known as: NIASPAN   Take 1,000 mg by mouth at bedtime.      nitroGLYCERIN 0.4 MG SL tablet   Commonly known as: NITROSTAT   Place 0.4 mg under the tongue every 5 (five) minutes as needed. For chest pain      omeprazole 20 MG capsule   Commonly known as: PRILOSEC   Take 20 mg by mouth 2 (two) times daily.      pravastatin 40 MG tablet   Commonly known as: PRAVACHOL   Take 40 mg by mouth every evening.        pregabalin 75 MG capsule   Commonly known as: LYRICA   Take 75 mg by mouth 2 (two) times daily.      psyllium 0.52 G capsule   Commonly known as: REGULOID   Take 0.52 g by mouth 2 (two) times daily.      Red Yeast Rice 600 MG Caps   Take 1 capsule by mouth 2 (two) times daily.      STRESSTABS PO   Take 1 tablet by mouth at bedtime.      trimethoprim 100 MG tablet   Commonly known as: TRIMPEX   Take 1 tablet by mouth at bedtime.      venlafaxine 37.5 MG tablet   Commonly known as: EFFEXOR   TAKE ONE TABLET BY MOUTH EVERY DAY      vitamin C 1000 MG tablet   Take 1,000 mg by mouth daily.      Vitamin D3 1000 UNITS Caps   Take 1 capsule by mouth daily.             SignedClydene Fake, MD 04/09/2012, 7:55 AM

## 2012-04-29 ENCOUNTER — Other Ambulatory Visit (INDEPENDENT_AMBULATORY_CARE_PROVIDER_SITE_OTHER): Payer: Medicare Other

## 2012-04-29 ENCOUNTER — Ambulatory Visit (INDEPENDENT_AMBULATORY_CARE_PROVIDER_SITE_OTHER): Payer: Medicare Other | Admitting: Pulmonary Disease

## 2012-04-29 ENCOUNTER — Encounter: Payer: Self-pay | Admitting: Pulmonary Disease

## 2012-04-29 VITALS — BP 126/60 | HR 59 | Temp 97.4°F | Ht 61.0 in | Wt 140.2 lb

## 2012-04-29 DIAGNOSIS — K219 Gastro-esophageal reflux disease without esophagitis: Secondary | ICD-10-CM

## 2012-04-29 DIAGNOSIS — M542 Cervicalgia: Secondary | ICD-10-CM

## 2012-04-29 DIAGNOSIS — I10 Essential (primary) hypertension: Secondary | ICD-10-CM

## 2012-04-29 DIAGNOSIS — K589 Irritable bowel syndrome without diarrhea: Secondary | ICD-10-CM

## 2012-04-29 DIAGNOSIS — F411 Generalized anxiety disorder: Secondary | ICD-10-CM | POA: Diagnosis not present

## 2012-04-29 DIAGNOSIS — E119 Type 2 diabetes mellitus without complications: Secondary | ICD-10-CM | POA: Diagnosis not present

## 2012-04-29 DIAGNOSIS — I679 Cerebrovascular disease, unspecified: Secondary | ICD-10-CM

## 2012-04-29 DIAGNOSIS — I4949 Other premature depolarization: Secondary | ICD-10-CM | POA: Diagnosis not present

## 2012-04-29 DIAGNOSIS — E782 Mixed hyperlipidemia: Secondary | ICD-10-CM

## 2012-04-29 DIAGNOSIS — I2510 Atherosclerotic heart disease of native coronary artery without angina pectoris: Secondary | ICD-10-CM

## 2012-04-29 DIAGNOSIS — K5730 Diverticulosis of large intestine without perforation or abscess without bleeding: Secondary | ICD-10-CM

## 2012-04-29 DIAGNOSIS — M1990 Unspecified osteoarthritis, unspecified site: Secondary | ICD-10-CM

## 2012-04-29 DIAGNOSIS — G894 Chronic pain syndrome: Secondary | ICD-10-CM

## 2012-04-29 LAB — BASIC METABOLIC PANEL
Chloride: 102 mEq/L (ref 96–112)
Creatinine, Ser: 0.7 mg/dL (ref 0.4–1.2)
GFR: 84.51 mL/min (ref >60.00)

## 2012-04-29 LAB — CBC WITH DIFFERENTIAL/PLATELET
Basophils Relative: 0.5 % (ref 0.0–3.0)
Eosinophils Absolute: 0.2 10*3/uL (ref 0.0–0.7)
HCT: 39.7 % (ref 36.0–46.0)
Hemoglobin: 13.1 g/dL (ref 12.0–15.0)
Lymphs Abs: 3.1 10*3/uL (ref 0.7–4.0)
MCHC: 33 g/dL (ref 30.0–36.0)
MCV: 97.2 fl (ref 78.0–100.0)
Monocytes Absolute: 0.8 10*3/uL (ref 0.1–1.0)
Neutro Abs: 6.8 10*3/uL (ref 1.4–7.7)
Neutrophils Relative %: 62.5 % (ref 43.0–77.0)
RBC: 4.09 Mil/uL (ref 3.87–5.11)

## 2012-04-29 LAB — HEPATIC FUNCTION PANEL
ALT: 24 U/L (ref 0–35)
Bilirubin, Direct: 0.1 mg/dL (ref 0.0–0.3)
Total Bilirubin: 0.6 mg/dL (ref 0.3–1.2)

## 2012-04-29 LAB — LIPID PANEL
LDL Cholesterol: 60 mg/dL (ref 0–99)
Total CHOL/HDL Ratio: 3
Triglycerides: 170 mg/dL — ABNORMAL HIGH (ref 0.0–149.0)

## 2012-04-29 NOTE — Progress Notes (Signed)
Subjective:    Patient ID: [name] [name], female    DOB: 03-24-1941, 71 y.o.   MRN: [id number]  HPI 71 y/o WF here for a follow up visit... he has multiple medical problems as noted below...      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 they started Rx w/ phys therapy.    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  ~  April 20, 2010:  she's had bilat cat surg & she's seen Dr Caoilainn East (stable [name]), DrTannenbaum (started pelvic floor PT), and DrMurphy (given shot in knee- needs TKR) over the last 10months... she notes occas wheezing/ SOB, but she's been exerc on a treadmill...  BP stable on meds;  Chol is improved on Crestor but TGs elevated & may need Fibrate;  BS OK on Metformin but A1c not as good w/ weight gain;  we decided to check CXR (NAD), and PFTs (mild restriction)...  ~  October 09, 2010:  she is sched for left TKR by DrMurphy next month & needs medical clearance> already seen by DrCooper 1/12 & given the OK from Cards standpoint... she has continued neck pain & HA issues> followed by Carles Collet but she notes that Arthrotec, Tramadol, Methocarbamol, Lyrica - all w/o help for her recent post neck/ head pain... we decided to change the Tramadol to Vicodin (lim 3 daily) & see if this works better... breathing is stable on Advair; BP controlled on current med rx;  denies CP, palpit, SOB, cerebral ischemic symptoms, etc; she will ret to lab for fasting blood work...  ~  April 10, 2011:  60mo ROV & she  states she's doing much better> she is s/p left TKR 3/12 by DrMurphy; recent cellulitis left leg evaluated by DrDraper & treated w/ Keflex & resolved (VenDopplers were neg as well);  BP controlled w/ Metoprolol, Losartan, Lasix; she denies CP, palpit, SOB, & denies cerebral ischemic symptoms;  Chol looks good on Prav40 but TGs are worse & we discussed low fat diet & FishOil Rx;  BS much improved w/ Metformin & Glimep (A1c=5.8 & rec to decr glimep1mg  to 1/2 Qam)... She had a gastroenteritis 5/12 treated w/ Cipro, fluids, supportive care & resolved...    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    She also saw Urology 6/12 for f/u of her chronic cystitis & incont> tried on Gelnique, but didn't stick w/ it...  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    She is c/o soreness & tenderness in her left armpit & across her chest; the discomfort comes & goes, not ppt by exercise, she has additional neck pain that radiates into her left shoulder area & is followed by DrHirsh- seen 11/12 for her nonunion & spondylosis, on Arthrotec & Robaxin;  Additionally DrTomblin sent her for a mammogram & it was neg; they set up this f/u appt w/ me; she has hx mult somatic complaints & as noted mult specialists  tending to her needs...    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  NOTE> the knee prob developed after she was seen by Cards in Jan; her Plavix is on hold now because she is still on Coumadin due to her knee surg- she was told to "finish this up" by DrLandau & apparently she had a lot of the Coumadin left (she is almost out now & will switch back to Plavix)...     She also saw Urology, DrTannenbaum 12/12 w/ recurrent infections, chronic cystitis, urge incont, vag dryness & dyspareunia; on Premarin vag cream, & failed Gelnique & Vesicare Rx; she also took Trimethoprim Qhs for suppression but had a pansens Klebs UTI anyway that was treated w/ Cipro; the Trimethoprim is on hold while she is taking the Levaquin; DrT started her on Myrbetriq 50mg /d as a trial...     She has an impressive med list w/ >30 entries but many are supplements and herbs which she is not inclined to discontinue... 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 She is taking Robaxin Qid, Lyrica Bid, & Vicodin as needed for pain...    Pre-op clearance 6/13 from Cards> Hx CAD w/ IWMI 2007, she had PCI to RCA; Echocardiogram 12/08 showed EF 60%, mild MR, mild LAE;  Last LHC 12/10 showed dLM 20%, LAD beyond diagonal 60-70% (stable from prior catheterization in 2007), ostial circumflex 40-50%, mid RCA stent okay, EF 65%;  Given the moderate stenosis in the LAD follow up myoview was done 12/10 showing EF 76%, small inferoapical fixed defect, no ischemia;   She last saw Dr. Excell Seltzer 1/13 & her symptoms were stable at that time, f/u 85yr.  They decided to do a pre-op MYOVIEW done 7/13 & was a neg nuclear stress study- no EKG changes, EF=69%, normal wall motion...    Lipids managed w/ Prav40 + Niasp500-2/d & FLP looks good x sl incr TG; rec better low fat diet...    She remains on Metform500Bid & Glimep1mg /d; not checking BS at home; BS=116 & A1c is low at 5.4; therefore rec decr the Glimep to 1/2 tab...    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 We reviewed prob list, meds, xrays and labs> see below>> LABS 8/13:  FLP- looks good x TG=170;  Chems- ok x BS=116, A1c=5.4;  CBC- wnl;  TSH=1.76    PROBLEM LIST:    Hx of BRONCHITIS (ICD-466.0) - hx AB w/ refractory episode 4/11 & we added ADVAIR 100Bid, MUCINEX OTC, & Hydromet cough syrup Prn... ~  8/11:  she still c/o some wheezing & wants cough syrup refilled... CXR= clear, NAD; and PFTs show mild restriction only. ~  CXR 3/12 showed sl incr markings, borderline heart size, cervical plate, NAD.Marland Kitchen. ~  CXR 7/13 showed normal heart size, clear lungs, elev right hemidiaph, CSpine surg, NAD...  HYPERTENSION (ICD-401.9) - controlled on  METOPROLOL 100mg Bid, NORVASC 5mg /d, LOSARTAN 100mg /d, and LASIX 20mg /d...  ~  3/13:  BP= 128/80 and feeling OK- without HA, visual symptoms, CP, palpit, SOB, edema, etc... ~  8/13:  BP= 126/60 and she has some neck pain but denies CP, palpit, SOB, edema.  CORONARY ARTERY DISEASE (ICD-414.00) - on above + ASA 81mg /d + PLAVIX 75mg /d...  followed by DrCooper and his notes are reviewed. 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 she had PCI w/ drug eluting stent in RCA... ~  NuclearStressTest 12/08 showed sm area of ischemia inferolateral wall, norm  LVF=70%... ~  2DEcho 12/08 showed no LV regional wall motion abn, EF=60%, some DD was seen... ~  saw DrCooper 11/09- note reviewed... continue same meds, consider re-cath if CP occurs. ~  recurrent CP 12/10:  Myoview w/ sm inferoapical infarct, no ischemia, EF= 76%;  12/10 cath showed 20%LMain, 60-70%midLAD, 40-50%ostial CIRC, midRCA stent patent w/o restenosis, EF= 65%... stable, no change from 2007, med Rx. ~  Pre-op cards eval 6/13 was neg, OK for surg;  MYOVIEW done 7/13 & was a neg nuclear stress study- no EKG changes, EF=69%, normal wall motion...  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  CEREBROVASCULAR DISEASE (ICD-437.9) - CT Br 2/09 showed sm vessel disease and old lacunar infarct... on ASA + PLAVIX.  MIXED HYPERLIPIDEMIA (ICD-272.2) - on PRAVASTATIN 40mg /d + NIASPAN 500- 2Qhs + FISH OIL daily... ~  FLP 8/08 on Lipitor80 showed TChol 127, TG 201, HDL 34, LDL 61 ~  FLP 7/09 showed TChol 129, TG 265, HDL 36, LDL 65... Lipid Clinic may want to consider a fibrate... ~  FLP 9/09 howed TChol 152, TG 225, HDL 30, LDL 70 ~  FLP 1/10 showed TChol 137, TG 203, HDL 38, LDL 58 ~  FLP 9/10 showed TChol 154, TG 225, HDL 42, LDL 81 ~  FLP 8/11 showed TChol 118, TG 332, HDL 32, LDL 45... may need fibrate. ~  FLP 2/12 showed TChol 134, TG 257, HDL 33, LDL 58... needs better low fat diet! ~  FLP 8/12 showed TChol 174, TG 320, HDL 41, LDL 75... Ditto ~  FLP 8/13 showed TChol 134, TG 170, HDL 40, LDL 60  DIABETES MELLITUS, TYPE II (ICD-250.00) - on METFORMIN 500mg Bid & GLIMEPIRIDE 1mg /d... ~  labs 8/08 showed BS= 162, HgA1c= 6.5.Marland KitchenMarland Kitchen On Metform500- 1/2 Bid. ~  labs 2/09 showed BS= 154 ~  labs 9/09 (wt=152#) showed BS= 141, HgA1c= 6.1.Marland KitchenMarland Kitchen Continue same. ~  labs 9/10 (wt=126#) showed BS= 136, A1c= 6.1.Marland KitchenMarland Kitchen Great job w/ wt loss. ~  labs 8/11 showed BS= 137, A1c= 7.1 ~  labs  2/12 showed BS= 192, A1c= 7.5.Marland KitchenMarland Kitchen incr Metform500Bid, Add  Glimep1mg /d. ~  Labs 8/12 showed BS= 112, A1c= 5.8.Marland KitchenMarland Kitchen rec decr Glimep1mg  to 1/2 tab Qam. ~  3/13: review of EPIC labs shows BS 81-131 over the last 3 months... ~  Labs 8/13 showed BS= 116, A1c= 5.4.Marland KitchenMarland Kitchen rec to decr Glimep1mg  to 1/2 tab daily...  GASTROESOPHAGEAL REFLUX DISEASE (ICD-530.81) - last EGD 4/08 showed GERD and deformed pylorus... Rx w/ PPI... ~  she had a follow up w/ DrPatterson 7/09 for her GERD- on OMEP 20mg Bid, and IBS- on fiber & Bentyl... ~  5/12:  She had ?gastroenteritis & 10 lb wt loss, treated by TP w/ Cipro, Immodium, Bentyl==> improved.  DIVERTICULAR DISEASE (ICD-562.10) - last colonoscopy 11/04 by DrSam showed divertics only... IBS (ICD-564.1) - on BENTYL 10mg  Tid Prn...  URINARY INCONTINENCE (ICD-788.30) - eval 7/08 by DrTannenbaum- prev pubovag sling surg 2004... she has persist symptoms and he rec physical therapy... ~  she has had recurrent UTIs & Rx w/ Cipro... ~  6/12:  Had f/u Urology for her chr cystitis & incont, tried Gelnique, "no help" per pt & stopped. ~  3/13:  SEE ABOVE  DEGENERATIVE JOINT DISEASE (ICD-715.90)   < SEE ABOVE > ~  ER eval 12/10 for left knee pain- XRay w/ DJD, sm effusion- refer to Ortho, DrMurphy for shot (helped). ~  Office f/u 7/11 w/ another shot given- consider TKR. ~  3/12:  She had left TKR by DrMurphy, followed by rehab etc & improved... ~  3/13:  SEE ABOVE  NECK PAIN, CHRONIC (ICD-723.1) - s/p neck fusion surg x 2 in 2005 and 2006 by DrHirsh w/ a complex situation involving non-union, severe spondylosis, etc... notes from DrHirsh reviewed> on TRAMADOL 50mg  prn, & METHOCARBAMOL 500mg Qid. ~  saw DrHirsh 9/10 & 5/11- stable, continue conservative management... ~  2/12:  OV c/o some incr neck discomfort & HA> XRays unchanged, rec VICODIN up to 3/d as needed. 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  SYNDROME, CHRONIC PAIN (ICD-338.4) - on VICODIN prn, LYRICA 75mg Bid, METHOCARBAMOL...  DIZZINESS, CHRONIC (ICD-780.4) - full eval by DrSethi w/ CT/ MRI etc... Dx'd w/ benign positional vertigo and Rx'd w/ Eply manuever as needed.  ANXIETY (ICD-300.00) - on EFFEXOR 37.5mg /d for hot flashes & dizziness (she states this helps)... ~  Sep09:  she wants her "Factor V checked"... states that her sister & granddaughter see DrEnnever w/ clots and he thought it would be a good idea to check Timya for Factor V Leiden Mutation (she has never had a venous thromboembolic phenomenon)... 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 LABS 9/09 neg for Factor V mutation.   Past Surgical History  Procedure Date  . Cervical spine surgery 07/2005    Dr Tari East  . Bladder repair 2007  . Total abdominal hysterectomy   . Tonsillectomy   . Thumb surgery   . Neck surgery     has had 3 surgeries  . Temporomandibular joint surgery   . Left total knee replacement 11/2010    Dr Eulah Pont  . I&d knee with poly exchange 09/25/2011    Procedure: IRRIGATION AND DEBRIDEMENT KNEE WITH POLY EXCHANGE;  Surgeon: Eulas Post, MD;  Location: [facility] OR;  Service: Orthopedics;  Laterality: Left;  . Cardiac catheterization 2007    Stents  . Appendectomy 1955  . Tonsillectomy 1950  . Cholecystectomy 2007  . Eye surgery 2011    Bilteral  . Joint replacement 2012    left  . Incision and drainage abscess / hematoma of bursa / knee / thigh 09/2011  . Posterior cervical fusion/foraminotomy 04/08/2012    Procedure: POSTERIOR CERVICAL FUSION/FORAMINOTOMY LEVEL 2;  Surgeon: Clydene Fake, MD;  Location: [facility] OR[facility];  Service: Neurosurgery;  Laterality: Left;  Left Cervical six-seven Foraminotomy, bilateral cervical seven-thoracic one foraminotomy, cervical six-seven, cervical seven-thoracic one fusion with  posterior instrumentation    Outpatient Encounter Prescriptions as of 04/29/2012  Medication Sig Dispense Refill  . Aloe Vera 25 MG CAPS Take 1 capsule by mouth 2 (two) times daily.        Marland Kitchen amLODipine (NORVASC) 5 MG tablet Take 1 tablet (5 mg total) by mouth daily.  30 tablet  11  . Ascorbic Acid (VITAMIN C) 1000 MG tablet Take 1,000 mg by mouth daily.        Marland Kitchen aspirin EC 81 MG tablet Take 81 mg by mouth daily.      . calcium carbonate (OS-CAL) 600 MG TABS Take 600 mg by mouth 2 (two) times daily with a meal.        . Cholecalciferol (VITAMIN D3) 1000 UNITS CAPS Take 1 capsule by mouth daily.        . Cinnamon 500 MG capsule Take 500 mg by mouth 2 (two) times daily.       . clopidogrel (PLAVIX) 75 MG tablet Take 1 tablet by mouth Daily. HOLD WHILE ON COUMADIN      . Cod Liver Oil 1000 MG CAPS Take 1 capsule by mouth 2 (two) times daily.        . Coenzyme Q10 (CO Q 10) 100 MG CAPS Take 1 capsule by mouth daily.        Marland Kitchen dicyclomine (BENTYL) 10 MG capsule TAKE ONE CAPSULE BY MOUTH UP TO THREE TIMES DAILY AS NEEDED FOR ABDOMINAL CRAMPING  90 capsule  6  . Flaxseed, Linseed, 1000 MG CAPS Take 1 capsule by mouth 2 (two) times daily.       . furosemide (LASIX) 20 MG tablet Take 20 mg by mouth daily.      . Garlic 1000 MG CAPS Take 2 capsules by mouth at bedtime.        Marland Kitchen glimepiride (AMARYL) 1 MG tablet TAKE ONE TABLET BY MOUTH EVERY DAY IN THE MORNING  30 tablet  11  . Glucosamine-Chondroit-Vit C-Mn (GLUCOSAMINE CHONDR 1500 COMPLX PO) Take 1 capsule by mouth 2 (two) times daily.        Marland Kitchen losartan (COZAAR) 100 MG tablet Take 100 mg by mouth daily.      . metFORMIN (GLUCOPHAGE) 500 MG tablet TAKE ONE TABLET BY MOUTH TWICE DAILY  60 tablet  6  . methocarbamol (ROBAXIN) 500 MG tablet Take 500 mg by mouth 4 (four) times daily as needed. For pain/ muscle spasms      . metoprolol (LOPRESSOR) 100 MG tablet Take 100 mg by mouth 2 (two) times daily.      . Multiple Vitamin (STRESSTABS PO) Take 1 tablet by  mouth at bedtime.        . niacin (NIASPAN) 500 MG CR tablet Take 1,000 mg by mouth at bedtime.      . nitroGLYCERIN (NITROSTAT) 0.4 MG SL tablet Place 0.4 mg  under the tongue every 5 (five) minutes as needed. For chest pain      . Omega-3 Fatty Acids (FISH OIL) 1000 MG CAPS Take 1 capsule by mouth 4 (four) times daily.        Marland Kitchen omeprazole (PRILOSEC) 20 MG capsule Take 20 mg by mouth 2 (two) times daily.       . pravastatin (PRAVACHOL) 40 MG tablet Take 40 mg by mouth every evening.      . pregabalin (LYRICA) 75 MG capsule Take 75 mg by mouth 2 (two) times daily.      . psyllium (REGULOID) 0.52 G capsule Take 0.52 g by mouth 2 (two) times daily.        . Red Yeast Rice 600 MG CAPS Take 1 capsule by mouth 2 (two) times daily.        Marland Kitchen trimethoprim (TRIMPEX) 100 MG tablet Take 1 tablet by mouth at bedtime.      Marland Kitchen venlafaxine (EFFEXOR) 37.5 MG tablet TAKE ONE TABLET BY MOUTH EVERY DAY  30 tablet  6    Allergies  Allergen Reactions  . Codeine     REACTION: nausea  . Niacin And Related     Must take "Flush-free"  . Prednisone     REACTION: rash  . Sulfonamide Derivatives     REACTION: rash  . Tetracycline     REACTION: rash    Current Medications, Allergies, Past Medical History, Past Surgical History, Family History, and Social History were reviewed in [employer] record.    Review of Systems    See HPI - all other systems neg except as noted...  The patient complains of dyspnea on exertion, headaches, and depression.  The patient denies anorexia, fever, weight loss, weight gain, vision loss, decreased hearing, hoarseness, chest pain, syncope, peripheral edema, prolonged cough, hemoptysis, abdominal pain, melena, hematochezia, severe indigestion/heartburn, hematuria, incontinence, muscle weakness, suspicious skin lesions, transient blindness, difficulty walking, unusual weight change, abnormal bleeding, enlarged lymph nodes, and angioedema.     Objective:    Physical Exam    WD, WN, 71 y/o WF in NAD... GENERAL:  Alert & oriented; pleasant & cooperative... HEENT:  [place]/AT, EOM-wnl, PERRLA, EACs-clear, TMs-wnl, NOSE-clear, THROAT-clear & wnl. NECK:  In Cx collar now w/ decrROM & scars from surg; no JVD; normal carotid impulses w/o bruits;  no thyromegaly or nodules palpated; no lymphadenopathy. CHEST:  Clear to P & A; without wheezes/ rales/ or rhonchi heard... HEART:  Regular Rhythm; without murmurs/ rubs/ or gallops detected... ABDOMEN:  Soft & nontender; normal bowel sounds; no organomegaly or masses palpated... EXT: s/p LTKR, mod arthritic changes; no varicose veins/ +venous insuffic/ tr edema. She has decr ROM left shoulder & neck, tender left pectoralis muscle & chest wall... NEURO:  CN's intact; no focal neuro deficits... DERM:  No lesions noted; no rash etc...  RADIOLOGY DATA:  Reviewed in the EPIC EMR & discussed w/ the patient...  LABORATORY DATA:  Reviewed in the EPIC EMR & discussed w/ the patient...   Assessment & Plan:    HBP>  BP stable on Rx w/ Metoprolol, Losartan, & Lasix; continue same...  CAD>  On ASA/ Plavix (soon to be restarted) & followed by DrCooper; stable w/o angina, palpit, SOB, edema...  Cerebrovasc dis>  Stable on meds and denies cerebral ischemic symptoms...  HYPERLIPIDEMIA>  TGs still elev & needs better low fat diet; continue same meds for now...  DM>  Improved control & w/ A1c down to 5.8 we will  back off on the Glimep now down to 1/2 tab Qam...  GI> GERD, Divertics, IBS>  Stable on Omep & Bentyl...  GU> UTIs, chr cystitis, incont>  Followed & managed by DrTannenbaum...  DJD> s/p left TKR> Infected left TKR prosthesis>  managed by DrMurphy et al & DrCampbell for ID- off all antibiotics now & stable...  [name]>  S/p surgeries w/ fixation, plating, non-union, etc;  followed & managed by DrHirsh ==> repeat surg 8/13 as above...  Anxiety>  On Effexor which helps her hot flashes & dizziness she  says...   Patient's Medications  New Prescriptions   No medications on file  Previous Medications   ALOE VERA 25 MG CAPS    Take 1 capsule by mouth 2 (two) times daily.     AMLODIPINE (NORVASC) 5 MG TABLET    Take 1 tablet (5 mg total) by mouth daily.   ASCORBIC ACID (VITAMIN C) 1000 MG TABLET    Take 1,000 mg by mouth daily.     ASPIRIN EC 81 MG TABLET    Take 81 mg by mouth daily.   CALCIUM CARBONATE (OS-CAL) 600 MG TABS    Take 600 mg by mouth 2 (two) times daily with a meal.     CHOLECALCIFEROL (VITAMIN D3) 1000 UNITS CAPS    Take 1 capsule by mouth daily.     CINNAMON 500 MG CAPSULE    Take 500 mg by mouth 2 (two) times daily.    CLOPIDOGREL (PLAVIX) 75 MG TABLET    Take 1 tablet by mouth Daily. HOLD WHILE ON COUMADIN   COD LIVER OIL 1000 MG CAPS    Take 1 capsule by mouth 2 (two) times daily.     COENZYME Q10 (CO Q 10) 100 MG CAPS    Take 1 capsule by mouth daily.     DICYCLOMINE (BENTYL) 10 MG CAPSULE    TAKE ONE CAPSULE BY MOUTH UP TO THREE TIMES DAILY AS NEEDED FOR ABDOMINAL CRAMPING   FLAXSEED, LINSEED, 1000 MG CAPS    Take 1 capsule by mouth 2 (two) times daily.    FUROSEMIDE (LASIX) 20 MG TABLET    Take 20 mg by mouth daily.   GARLIC 1000 MG CAPS    Take 2 capsules by mouth at bedtime.     GLIMEPIRIDE (AMARYL) 1 MG TABLET    TAKE ONE TABLET BY MOUTH EVERY DAY IN THE MORNING   GLUCOSAMINE-CHONDROIT-VIT C-MN (GLUCOSAMINE CHONDR 1500 COMPLX PO)    Take 1 capsule by mouth 2 (two) times daily.     LOSARTAN (COZAAR) 100 MG TABLET    Take 100 mg by mouth daily.   METFORMIN (GLUCOPHAGE) 500 MG TABLET    TAKE ONE TABLET BY MOUTH TWICE DAILY   METHOCARBAMOL (ROBAXIN) 500 MG TABLET    Take 500 mg by mouth 4 (four) times daily as needed. For pain/ muscle spasms   METOPROLOL (LOPRESSOR) 100 MG TABLET    Take 100 mg by mouth 2 (two) times daily.   MULTIPLE VITAMIN (STRESSTABS PO)    Take 1 tablet by mouth at bedtime.     NIACIN (NIASPAN) 500 MG CR TABLET    Take 1,000 mg by mouth at  bedtime.   NITROGLYCERIN (NITROSTAT) 0.4 MG SL TABLET    Place 0.4 mg under the tongue every 5 (five) minutes as needed. For chest pain   OMEGA-3 FATTY ACIDS (FISH OIL) 1000 MG CAPS    Take 1 capsule by mouth 4 (four) times daily.  OMEPRAZOLE (PRILOSEC) 20 MG CAPSULE    Take 20 mg by mouth 2 (two) times daily.    PRAVASTATIN (PRAVACHOL) 40 MG TABLET    Take 40 mg by mouth every evening.   PREGABALIN (LYRICA) 75 MG CAPSULE    Take 75 mg by mouth 2 (two) times daily.   PSYLLIUM (REGULOID) 0.52 G CAPSULE    Take 0.52 g by mouth 2 (two) times daily.     RED YEAST RICE 600 MG CAPS    Take 1 capsule by mouth 2 (two) times daily.     TRIMETHOPRIM (TRIMPEX) 100 MG TABLET    Take 1 tablet by mouth at bedtime.   VENLAFAXINE (EFFEXOR) 37.5 MG TABLET    TAKE ONE TABLET BY MOUTH EVERY DAY  Modified Medications   No medications on file  Discontinued Medications   No medications on file

## 2012-04-29 NOTE — Patient Instructions (Addendum)
Today we updated your med list in our EPIC system...    Continue your current medications the same...  Today we did your follow up FASTING blood work...    We will call you w/ the results...  Don't forget to take ALIGN daily & the Activia Yogurt while you are on the antibiotic from the dentist...  Call for any questions...  Let's plan a follow up visit in 6 months, sooner if needed for any problems.Marland KitchenMarland Kitchen

## 2012-05-01 ENCOUNTER — Other Ambulatory Visit: Payer: Self-pay | Admitting: *Deleted

## 2012-05-01 MED ORDER — GLIMEPIRIDE 1 MG PO TABS: ORAL_TABLET | ORAL | Status: DC

## 2012-05-07 DIAGNOSIS — R32 Unspecified urinary incontinence: Secondary | ICD-10-CM | POA: Diagnosis not present

## 2012-05-07 DIAGNOSIS — N312 Flaccid neuropathic bladder, not elsewhere classified: Secondary | ICD-10-CM | POA: Diagnosis not present

## 2012-05-07 DIAGNOSIS — N3020 Other chronic cystitis without hematuria: Secondary | ICD-10-CM | POA: Diagnosis not present

## 2012-05-09 ENCOUNTER — Other Ambulatory Visit: Payer: Self-pay | Admitting: Urology

## 2012-05-09 ENCOUNTER — Telehealth: Payer: Self-pay | Admitting: Cardiovascular Disease

## 2012-05-09 NOTE — Telephone Encounter (Signed)
Chart reviewed. The patient was recently evaluated by Tereso Newcomer in our office. She had a nuclear stress test just last month and this showed no ischemia. She is out many years from her most recent PCI. She is at acceptable risk to hold Plavix for 7 days and aspirin for 5 days for her planned surgery.  Tonny Bollman 05/09/2012 5:00 PM

## 2012-05-09 NOTE — Telephone Encounter (Signed)
Pt is having super pubic tube placement on 10/3 and needs a letter of clearance sent out fax# [phone] to stop plavix for 7 days and asa for 5 days

## 2012-05-12 NOTE — Telephone Encounter (Signed)
I left a message on [name] [name]'s voicemail that Dr Excell Seltzer has documented note in EPIC.  I will fax note to [facility].

## 2012-05-19 ENCOUNTER — Ambulatory Visit (INDEPENDENT_AMBULATORY_CARE_PROVIDER_SITE_OTHER): Payer: Medicare Other

## 2012-05-19 DIAGNOSIS — Z23 Encounter for immunization: Secondary | ICD-10-CM | POA: Diagnosis not present

## 2012-05-21 ENCOUNTER — Other Ambulatory Visit: Payer: Self-pay | Admitting: Pulmonary Disease

## 2012-05-23 DIAGNOSIS — N3020 Other chronic cystitis without hematuria: Secondary | ICD-10-CM | POA: Diagnosis not present

## 2012-05-24 ENCOUNTER — Other Ambulatory Visit: Payer: Self-pay | Admitting: Pulmonary Disease

## 2012-05-27 ENCOUNTER — Encounter (HOSPITAL_COMMUNITY): Payer: Self-pay | Admitting: Pharmacy Technician

## 2012-06-02 ENCOUNTER — Inpatient Hospital Stay (HOSPITAL_COMMUNITY): Admission: RE | Admit: 2012-06-02 | Payer: Medicare Other | Source: Ambulatory Visit

## 2012-06-05 ENCOUNTER — Ambulatory Visit (HOSPITAL_COMMUNITY): Admission: RE | Admit: 2012-06-05 | Payer: Medicare Other | Source: Ambulatory Visit | Admitting: Urology

## 2012-06-05 ENCOUNTER — Encounter (HOSPITAL_COMMUNITY): Admission: RE | Payer: Self-pay | Source: Ambulatory Visit

## 2012-06-05 SURGERY — CREATION, CYSTOSTOMY, SUPRAPUBIC
Anesthesia: General

## 2012-06-09 DIAGNOSIS — M542 Cervicalgia: Secondary | ICD-10-CM | POA: Diagnosis not present

## 2012-06-16 ENCOUNTER — Other Ambulatory Visit: Payer: Self-pay | Admitting: Pulmonary Disease

## 2012-06-24 DIAGNOSIS — R300 Dysuria: Secondary | ICD-10-CM | POA: Diagnosis not present

## 2012-06-24 DIAGNOSIS — N3020 Other chronic cystitis without hematuria: Secondary | ICD-10-CM | POA: Diagnosis not present

## 2012-07-04 DIAGNOSIS — M542 Cervicalgia: Secondary | ICD-10-CM | POA: Diagnosis not present

## 2012-07-09 DIAGNOSIS — Q519 Congenital malformation of uterus and cervix, unspecified: Secondary | ICD-10-CM | POA: Diagnosis not present

## 2012-07-09 DIAGNOSIS — N312 Flaccid neuropathic bladder, not elsewhere classified: Secondary | ICD-10-CM | POA: Diagnosis not present

## 2012-07-09 DIAGNOSIS — Q5270 Unspecified congenital malformations of vulva: Secondary | ICD-10-CM | POA: Diagnosis not present

## 2012-07-09 DIAGNOSIS — N3020 Other chronic cystitis without hematuria: Secondary | ICD-10-CM | POA: Diagnosis not present

## 2012-07-29 LAB — HM DEXA SCAN: HM DEXA SCAN: NORMAL

## 2012-08-30 ENCOUNTER — Other Ambulatory Visit: Payer: Self-pay | Admitting: Pulmonary Disease

## 2012-09-10 ENCOUNTER — Ambulatory Visit (INDEPENDENT_AMBULATORY_CARE_PROVIDER_SITE_OTHER): Payer: Medicare Other | Admitting: Physician Assistant

## 2012-09-10 ENCOUNTER — Encounter: Payer: Self-pay | Admitting: Physician Assistant

## 2012-09-10 ENCOUNTER — Ambulatory Visit: Payer: Medicare Other | Admitting: Cardiovascular Disease

## 2012-09-10 VITALS — BP 124/60 | HR 69 | Ht 60.0 in | Wt 137.0 lb

## 2012-09-10 DIAGNOSIS — E782 Mixed hyperlipidemia: Secondary | ICD-10-CM | POA: Diagnosis not present

## 2012-09-10 DIAGNOSIS — I2510 Atherosclerotic heart disease of native coronary artery without angina pectoris: Secondary | ICD-10-CM

## 2012-09-10 DIAGNOSIS — I10 Essential (primary) hypertension: Secondary | ICD-10-CM

## 2012-09-10 NOTE — Assessment & Plan Note (Signed)
Well controlled 

## 2012-09-10 NOTE — Assessment & Plan Note (Signed)
Lipid profile checked 4 months ago by Dr. Lennon Alstrom and stable

## 2012-09-10 NOTE — Progress Notes (Signed)
HPI: This is a 72 year old female patient of Dr. Excell Seltzer who has a h/o CAD and initially presented with an inferior wall MI in 2007. She was treated with primary PCI utilizing a drug-eluting stent in the right coronary artery.  Echocardiogram 08/2007: EF 60%, mild MR, mild LAE.  Last LHC 08/2009: dLM 20%, LAD beyond diagonal 60-70%, stable from prior catheterization in 2007, ostial circumflex 40-50%, mid RCA stent okay, EF 65%.  Given the moderate stenosis in the LAD follow up myoview was recommended 08/2009: EF 76%, small inferoapical fixed defect, no ischemia.  She last saw Dr. Excell Seltzer 09/2011.  She had a normal stress myoview 03/2012 prior to undergoing cervical neck surgery.  Shortly after neck surgery she had 2 episodes of a grabbing chest pain that was sharp and nature and went up the left chest and her left shoulder. It was short lived and did not require nitroglycerin. She has had no chest pain since then she denies any chest heaviness pressure, dyspnea, dyspnea on exertion, dizziness, or presyncope. She has been on a low-fat diet and has lost almost 20 pounds. She is walking on the treadmill and riding an exercise bike daily. She says she bruises easily on the Plavix if she bumps her arm or leg against something. She denies any melena, black stools or bloody noses.   Allergies: -- Codeine    --  REACTION: nausea  -- Niacin And Related    --  Must take "Flush-free"  -- Prednisone    --  REACTION: rash  -- Sulfonamide Derivatives    --  REACTION: rash  -- Tetracycline    --  REACTION: rash  Current Outpatient Prescriptions on File Prior to Visit: Aloe Vera 25 MG CAPS, Take 1 capsule by mouth 2 (two) times daily. , Disp: , Rfl:  amLODipine (NORVASC) 5 MG tablet, Take 5 mg by mouth every morning., Disp: , Rfl:  Ascorbic Acid (VITAMIN C) 1000 MG tablet, Take 1,000 mg by mouth daily.  , Disp: , Rfl:  aspirin EC 81 MG tablet, Take 81 mg by mouth every morning. , Disp: , Rfl:  calcium carbonate  (OS-CAL) 600 MG TABS, Take 600 mg by mouth 2 (two) times daily with a meal.  , Disp: , Rfl:  Cholecalciferol (VITAMIN D3) 1000 UNITS CAPS, Take 1 capsule by mouth daily.  , Disp: , Rfl:  Cinnamon 500 MG capsule, Take 500 mg by mouth 2 (two) times daily. , Disp: , Rfl:  clopidogrel (PLAVIX) 75 MG tablet, TAKE ONE TABLET BY MOUTH EVERY DAY, Disp: 30 tablet, Rfl: 6 Cod Liver Oil 1000 MG CAPS, Take 1 capsule by mouth 2 (two) times daily.  , Disp: , Rfl:  Coenzyme Q10 (CO Q 10) 100 MG CAPS, Take 1 capsule by mouth daily.  , Disp: , Rfl:  dicyclomine (BENTYL) 10 MG capsule, Take 10 mg by mouth 3 (three) times daily. Abdomen cramping, Disp: , Rfl:  Flaxseed, Linseed, 1000 MG CAPS, Take 1 capsule by mouth 2 (two) times daily. , Disp: , Rfl:  furosemide (LASIX) 20 MG tablet, Take 20 mg by mouth every morning. , Disp: , Rfl:  Garlic 1000 MG CAPS, Take 2 capsules by mouth at bedtime.  , Disp: , Rfl:  glimepiride (AMARYL) 1 MG tablet, Take 0.5 mg by mouth daily before breakfast., Disp: , Rfl:  Glucosamine-Chondroit-Vit C-Mn (GLUCOSAMINE CHONDR 1500 COMPLX PO), Take 1 capsule by mouth 2 (two) times daily.  , Disp: , Rfl:  losartan (COZAAR) 100 MG  tablet, Take 100 mg by mouth every morning. , Disp: , Rfl:  losartan (COZAAR) 100 MG tablet, TAKE ONE TABLET BY MOUTH EVERY DAY, Disp: 30 tablet, Rfl: 4 metFORMIN (GLUCOPHAGE) 500 MG tablet, Take 500 mg by mouth 2 (two) times daily with a meal., Disp: , Rfl:  methocarbamol (ROBAXIN) 500 MG tablet, Take 500 mg by mouth 4 (four) times daily as needed. For pain/ muscle spasms, Disp: , Rfl:  metoprolol (LOPRESSOR) 100 MG tablet, Take 100 mg by mouth 2 (two) times daily., Disp: , Rfl:  Multiple Vitamin (STRESSTABS PO), Take 1 tablet by mouth at bedtime.  , Disp: , Rfl:  niacin (NIASPAN) 500 MG CR tablet, Take 1,000 mg by mouth at bedtime., Disp: , Rfl:  nitroGLYCERIN (NITROSTAT) 0.4 MG SL tablet, Place 0.4 mg under the tongue every 5 (five) minutes as needed. For chest  pain, Disp: , Rfl:  Omega-3 Fatty Acids (FISH OIL) 1000 MG CAPS, Take 1 capsule by mouth 4 (four) times daily.  , Disp: , Rfl:  omeprazole (PRILOSEC) 20 MG capsule, Take 20 mg by mouth 2 (two) times daily. , Disp: , Rfl:  pravastatin (PRAVACHOL) 40 MG tablet, Take 40 mg by mouth every evening., Disp: , Rfl:  pregabalin (LYRICA) 75 MG capsule, Take 75 mg by mouth 2 (two) times daily., Disp: , Rfl:  psyllium (REGULOID) 0.52 G capsule, Take 0.52 g by mouth 2 (two) times daily.  , Disp: , Rfl:  Red Yeast Rice 600 MG CAPS, Take 1 capsule by mouth 2 (two) times daily.  , Disp: , Rfl:  trimethoprim (TRIMPEX) 100 MG tablet, Take 1 tablet by mouth at bedtime., Disp: , Rfl:  venlafaxine (EFFEXOR) 37.5 MG tablet, Take 37.5 mg by mouth every morning., Disp: , Rfl:  [DISCONTINUED] enoxaparin (LOVENOX) 40 MG/0.4ML SOLN, Inject 0.4 mLs (40 mg total) into the skin daily., Disp: 5 Syringe, Rfl: 0    Past Medical History:   HTN (hypertension)                                           CAD (coronary artery disease)                                  Comment:1.  inf MI 2007 - tx with DES to RCA;  2.                Echocardiogram 08/2007: EF 60%, mild MR, mild               LAE.  3.  Last LHC 08/2009: dLM 20%, LAD beyond              diagonal 60-70%, stable from prior               catheterization in 2007, ostial circumflex               40-50%, mid RCA stent okay, EF 65%. ;  4.                myoview was recommended 08/2009: EF 76%, small               inferoapical fixed defect, no ischemia  5.                Myoview 7/13: no scar or ischemia, EF 69%  Premature ventricular contractions                           Cerebrovascular disease, unspecified                         Mixed hyperlipidemia                                         DM type 2 (diabetes mellitus, type 2)                        GERD (gastroesophageal reflux disease)                       Diverticular disease                                           IBS (irritable bowel syndrome)                               Urinary incontinence                                         DJD (degenerative joint disease)                             Chronic neck pain                                            Chronic pain syndrome                                        Dizziness                                                    Anxiety                                                      Infection of prosthetic knee joint, left        09/25/2011    H/O hiatal hernia                                            Nocturia  Chronic kidney disease                                         Comment:frequent Kidney Infections   Pancreatitis                                                   Comment:1956 an once more   PONV (postoperative nausea and vomiting)                       Comment:Difficluty opening mouth wide and turning head.              (Cervical Fusion)   Mitral valve prolapse                                       Past Surgical History:   CERVICAL SPINE SURGERY                          07/2005        Comment:Dr Charlissa East   BLADDER REPAIR                                  2007         TOTAL ABDOMINAL HYSTERECTOMY                                 TONSILLECTOMY                                                thumb surgery                                                NECK SURGERY                                                   Comment:has had 3 surgeries   TEMPOROMANDIBULAR JOINT SURGERY                              left Total Knee Replacement                     11/2010         Comment:Dr Murphy   I&D KNEE WITH POLY EXCHANGE                     09/25/2011      Comment:Procedure: IRRIGATION AND DEBRIDEMENT KNEE WITH              POLY EXCHANGE;  Surgeon: Ivin Booty  Robie Ridge, MD;                Location: [facility] OR;  Service: Orthopedics;                Laterality: Left;   CARDIAC CATHETERIZATION                          2007           Comment:Stents   APPENDECTOMY                                    1955         TONSILLECTOMY                                   1950         CHOLECYSTECTOMY                                 2007         EYE SURGERY                                     2011           Comment:Bilteral   JOINT REPLACEMENT                               2012           Comment:left   INCISION AND DRAINAGE ABSCESS / HEMATOMA OF BU* 09/2011       POSTERIOR CERVICAL FUSION/FORAMINOTOMY          04/08/2012       Comment:Procedure: POSTERIOR CERVICAL               FUSION/FORAMINOTOMY LEVEL 2;  Surgeon: Clydene Fake, MD;  Location: [facility];  Service:               Neurosurgery;  Laterality: Left;  Left Cervical              six-seven Foraminotomy, bilateral cervical               seven-thoracic one foraminotomy, cervical               six-seven, cervical seven-thoracic one fusion               with posterior instrumentation  Review of patient's family history indicates:   Heart disease                  Mother                   Coronary artery disease        Other                      Comment: sibling   Coronary artery disease        Other  Comment: sibling   Social History   Marital Status: Married             Spouse Name: Engineer, technical sales          Years of Education:                 Number of children:             Occupational History Occupation          Associate Professor            Comment                Social History Main Topics   Smoking Status: Never Smoker                     Smokeless Status: Never Used                       Alcohol Use: No             Drug Use: No             Sexual Activity: Not on file        Other Topics            Concern   None on file  Social History Narrative   Pt never knew her father   Daily caffeine use    ROS:see history of present illness otherwise negative   PHYSICAL EXAM: Well-nournished, in no acute  distress. Neck: No JVD, HJR, Bruit, or thyroid enlargement  Lungs: No tachypnea, clear without wheezing, rales, or rhonchi  Cardiovascular: RRR, PMI not displaced, 1/6 systolic murmur cholesterol border, positive S4, no bruit, thrill, or heave.  Abdomen: BS normal. Soft without organomegaly, masses, lesions or tenderness.  Extremities: without cyanosis, clubbing or edema. Good distal pulses bilateral  SKin: Warm, no lesions or rashes   Musculoskeletal: No deformities  Neuro: no focal signs  BP 124/60  Pulse 69  Ht 5' (1.524 m)  Wt 137 lb (62.143 kg)  BMI 26.76 kg/m2    EKG: Impression Exercise Capacity:  Lexiscan with no exercise. BP Response:  Normal blood pressure response. Clinical Symptoms:  Short of breath.  ECG Impression:  No significant ST segment change suggestive of ischemia. Comparison with Prior Nuclear Study: No images to compare  Overall Impression:  Normal stress nuclear study.  LV Ejection Fraction: 69%.  LV Wall Motion:  NL LV Function; NL Wall Motion  [name] [name] 03/05/2012

## 2012-09-10 NOTE — Assessment & Plan Note (Addendum)
Patient is stable without cardiac complaints. She had a normal stress Myoview on 03/05/12. She has lost almost 20 pounds on a low-fat diet is doing great.Continue current medicines.

## 2012-09-10 NOTE — Patient Instructions (Addendum)
Your physician recommends that you continue on your current medications as directed. Please refer to the Current Medication list given to you today.  Your physician wants you to follow-up in: 1 year. You will receive a reminder letter in the mail two months in advance. If you don't receive a letter, please call our office to schedule the follow-up appointment.  

## 2012-09-16 ENCOUNTER — Other Ambulatory Visit: Payer: Self-pay | Admitting: Pulmonary Disease

## 2012-09-29 ENCOUNTER — Other Ambulatory Visit: Payer: Self-pay | Admitting: Cardiovascular Disease

## 2012-10-10 DIAGNOSIS — N3020 Other chronic cystitis without hematuria: Secondary | ICD-10-CM | POA: Diagnosis not present

## 2012-10-15 ENCOUNTER — Other Ambulatory Visit: Payer: Self-pay | Admitting: Pulmonary Disease

## 2012-10-30 ENCOUNTER — Encounter: Payer: Self-pay | Admitting: Pulmonary Disease

## 2012-10-30 ENCOUNTER — Ambulatory Visit (INDEPENDENT_AMBULATORY_CARE_PROVIDER_SITE_OTHER): Payer: Medicare Other | Admitting: Pulmonary Disease

## 2012-10-30 ENCOUNTER — Ambulatory Visit: Payer: Medicare Other | Admitting: Pulmonary Disease

## 2012-10-30 ENCOUNTER — Other Ambulatory Visit (INDEPENDENT_AMBULATORY_CARE_PROVIDER_SITE_OTHER): Payer: Medicare Other

## 2012-10-30 VITALS — BP 120/64 | HR 60 | Temp 98.8°F | Ht 61.0 in | Wt 137.0 lb

## 2012-10-30 DIAGNOSIS — I2510 Atherosclerotic heart disease of native coronary artery without angina pectoris: Secondary | ICD-10-CM

## 2012-10-30 DIAGNOSIS — I10 Essential (primary) hypertension: Secondary | ICD-10-CM

## 2012-10-30 DIAGNOSIS — R32 Unspecified urinary incontinence: Secondary | ICD-10-CM

## 2012-10-30 DIAGNOSIS — K219 Gastro-esophageal reflux disease without esophagitis: Secondary | ICD-10-CM

## 2012-10-30 DIAGNOSIS — K5730 Diverticulosis of large intestine without perforation or abscess without bleeding: Secondary | ICD-10-CM

## 2012-10-30 DIAGNOSIS — F411 Generalized anxiety disorder: Secondary | ICD-10-CM

## 2012-10-30 DIAGNOSIS — E119 Type 2 diabetes mellitus without complications: Secondary | ICD-10-CM

## 2012-10-30 DIAGNOSIS — I679 Cerebrovascular disease, unspecified: Secondary | ICD-10-CM | POA: Diagnosis not present

## 2012-10-30 DIAGNOSIS — K589 Irritable bowel syndrome without diarrhea: Secondary | ICD-10-CM

## 2012-10-30 DIAGNOSIS — M1990 Unspecified osteoarthritis, unspecified site: Secondary | ICD-10-CM

## 2012-10-30 DIAGNOSIS — E782 Mixed hyperlipidemia: Secondary | ICD-10-CM

## 2012-10-30 DIAGNOSIS — G894 Chronic pain syndrome: Secondary | ICD-10-CM

## 2012-10-30 DIAGNOSIS — M542 Cervicalgia: Secondary | ICD-10-CM

## 2012-10-30 DIAGNOSIS — I4949 Other premature depolarization: Secondary | ICD-10-CM | POA: Diagnosis not present

## 2012-10-30 LAB — BASIC METABOLIC PANEL
BUN: 10 mg/dL (ref 6–23)
CO2: 34 mEq/L — ABNORMAL HIGH (ref 19–32)
Chloride: 97 mEq/L (ref 96–112)
Creatinine, Ser: 0.8 mg/dL (ref 0.4–1.2)

## 2012-10-30 NOTE — Patient Instructions (Addendum)
Today we updated your med list in our EPIC system...    Continue your current medications the same...  Today we checked your DM labs...    We will contact you w/ the results when avail...  Keep up the good work w/ diet & exercise program...  Call for any questions...  Let's plan a follow up visit in another 6 months.Marland KitchenMarland Kitchen

## 2012-11-05 ENCOUNTER — Other Ambulatory Visit: Payer: Self-pay | Admitting: *Deleted

## 2012-11-05 ENCOUNTER — Other Ambulatory Visit: Payer: Self-pay | Admitting: Pulmonary Disease

## 2012-11-05 ENCOUNTER — Encounter: Payer: Self-pay | Admitting: Pulmonary Disease

## 2012-11-05 DIAGNOSIS — K5730 Diverticulosis of large intestine without perforation or abscess without bleeding: Secondary | ICD-10-CM

## 2012-11-05 DIAGNOSIS — K589 Irritable bowel syndrome without diarrhea: Secondary | ICD-10-CM

## 2012-11-05 NOTE — Progress Notes (Signed)
Subjective:    Patient ID: [name] [name], female    DOB: 20-Jun-1941, 72 y.o.   MRN: [id number]  HPI 72 y/o WF here for a follow up visit... he has multiple medical problems as noted below...      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 they started Rx w/ phys therapy.    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  ~  April 10, 2011:  36mo ROV & she states she's doing much better> she is s/p left TKR 3/12 by DrMurphy; recent cellulitis left leg evaluated by DrDraper & treated w/ Keflex & resolved (VenDopplers were neg as well);  BP controlled w/ Metoprolol, Losartan, Lasix; she denies CP, palpit, SOB, & denies cerebral ischemic symptoms;  Chol looks good on Prav40 but TGs are worse & we discussed low fat diet & FishOil Rx;  BS much improved w/ Metformin & Glimep (A1c=5.8 & rec to decr glimep1mg  to 1/2 Qam)... She had a gastroenteritis 5/12 treated w/ Cipro, fluids, supportive care & resolved...    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    She also saw Urology 6/12 for f/u of her chronic cystitis & incont> tried on Gelnique, but didn't stick w/ it...  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    She is c/o soreness & tenderness in her left armpit & across her chest; the discomfort comes & goes, not ppt by exercise, she has additional neck pain that radiates into her left shoulder area & is followed by DrHirsh- seen 11/12 for her nonunion & spondylosis, on Arthrotec & Robaxin;  Additionally DrTomblin sent her for a mammogram & it was neg; they set up this f/u appt w/ me; she has hx mult somatic complaints & as noted mult specialists tending to her needs...    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  NOTE> the knee prob developed after she was seen by Cards in Jan; her Plavix is on hold now because she is still on Coumadin due to her knee surg- she was told  to "finish this up" by DrLandau & apparently she had a lot of the Coumadin left (she is almost out now & will switch back to Plavix)...     She also saw Urology, DrTannenbaum 12/12 w/ recurrent infections, chronic cystitis, urge incont, vag dryness & dyspareunia; on Premarin vag cream, & failed Gelnique & Vesicare Rx; she also took Trimethoprim Qhs for suppression but had a pansens Klebs UTI anyway that was treated w/ Cipro; the Trimethoprim is on hold while she is taking the Levaquin; DrT started her on Myrbetriq 50mg /d as a trial...     She has an impressive med list w/ >30 entries but many are supplements and herbs which she is not inclined to  discontinue... 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 She is taking Robaxin Qid, Lyrica Bid, & Vicodin as needed for pain...    Pre-op clearance 6/13 from Cards> Hx CAD w/ IWMI 2007, she had PCI to RCA; Echocardiogram 12/08 showed EF 60%, mild MR, mild LAE;  Last LHC 12/10 showed dLM 20%, LAD beyond diagonal 60-70% (stable from prior catheterization in 2007), ostial circumflex 40-50%, mid RCA stent okay, EF 65%;  Given the moderate stenosis in the LAD follow up myoview was done 12/10 showing EF 76%, small inferoapical fixed defect, no ischemia;  She last saw Dr. Excell Seltzer 1/13 & her symptoms were stable at that time, f/u 47yr.  They decided to do a pre-op MYOVIEW done 7/13 & was a neg nuclear stress study- no EKG changes, EF=69%, normal wall motion...    Lipids managed w/ Prav40 + Niasp500-2/d & FLP looks good x sl incr TG; rec better low fat diet...    She remains on Metform500Bid & Glimep1mg /d; not checking BS at home; BS=116 & A1c is low at 5.4; therefore rec decr the Glimep to 1/2 tab...    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 We reviewed prob list, meds, xrays and labs> see below>> LABS 8/13:  FLP- looks good x TG=170;  Chems- ok x BS=116, A1c=5.4;  CBC- wnl;  TSH=1.76  ~  October 30, 2012:  37mo ROV & [name] [name] still has some neck discomfort since her last surg, also c/o tired fatigue but overall "doin pretty good";  We reviewed the following medical problems during today's office visit >>     Hx  bronchitis> no recent exac, w/o cough/ sput...    HBP> on Metop100Bid, Amlod5, Losar100, Lasix20; on 4 meds- BP=120/64 & she denies HA, CP, palpit, ch in SOB, edema...    CAD, PVCs> on ASA/ Plavix, & NTG prn; followed by DrCooper, seen 1/14 & stable, no changes made...    Cerebrovasc dis> on ASA, Plavix; hx sm vessel dis 7 old lacunar infarct; she denies cerebral ischemic symptoms...    Hyperlipid> on Prav40, Niasp500, FishOil, mult supplements; last FLP 8/13 showed TChol 134, TG 170, HDL 40, LDL 60; Rec- same meds better low fat diet & exerc...    DM> on Metform500bid, Glimep1mg -1/2; labs show BS=106,  A1c=5.9 & Rec to stop glimep...    GI- GERD, Divertics, IBS> on Prilosec20Bid, Bentyl10Tidprn, Reguloid; stable on meds, last colon 2004 & needs f/u soon...        GU- Urinary incont> on Trimpex per Urology; followed by DrTannenbaum w/ prev pubovag sling ...    DJD, Neck Pain, Chr pain syndrome> on ?Vicodin, Lyric, Robaxin, supplements; followed by Carles Collet, DrSethi, et al...    Chr dizzy> aware- she has been worked up by Neuro- DrSethi...    Anxiety> on Effexor37.5/d... We reviewed prob list, meds, xrays and labs> see below for updates >> she had the 2013 Flu vaccine 9/13... LABS 2/14:  Chems- wnl;  BS=106, A1c=5.9 on Metform500Bid & Glimep1mg -1/2 tab (told to stop)...           PROBLEM LIST:    Hx of BRONCHITIS (ICD-466.0) - hx AB w/ refractory episode 4/11 & we added ADVAIR 100Bid, MUCINEX OTC, & Hydromet cough syrup Prn... ~  8/11:  she still c/o some wheezing & wants cough syrup refilled... CXR= clear, NAD; and PFTs show mild restriction only. ~  CXR 3/12 showed sl incr markings, borderline heart size, cervical plate, NAD.Marland Kitchen. ~  CXR 7/13 showed normal heart size, clear lungs, elev right hemidiaph, CSpine surg, NAD...  HYPERTENSION (ICD-401.9) - controlled on  METOPROLOL 100mg Bid, NORVASC 5mg /d, LOSARTAN 100mg /d, and LASIX 20mg /d...  ~  3/13:  BP= 128/80 and feeling OK- without HA, visual  symptoms, CP, palpit, SOB, edema, etc... ~  8/13:  BP= 126/60 and she has some neck pain but denies CP, palpit, SOB, edema. ~  2/14: on Metop100Bid, Amlod5, Losar100, Lasix20; on 4 meds- BP=120/64 & she denies HA, CP, palpit, ch in SOB, edema  CORONARY ARTERY DISEASE (ICD-414.00) - on above + ASA 81mg /d + PLAVIX 75mg /d...  followed by DrCooper and his notes are reviewed. 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 she had PCI w/ drug eluting stent in RCA... ~  NuclearStressTest 12/08 showed sm area of ischemia inferolateral wall, norm LVF=70%... ~  2DEcho 12/08 showed no LV regional wall motion abn, EF=60%, some DD was seen... ~  saw DrCooper 11/09- note reviewed... continue same meds, consider re-cath if CP occurs. ~  recurrent CP 12/10:  Myoview w/ sm inferoapical infarct, no ischemia, EF= 76%;  12/10 cath showed 20%LMain, 60-70%midLAD, 40-50%ostial CIRC, midRCA stent patent w/o restenosis, EF= 65%... stable, no change from 2007, med Rx. ~  Pre-op cards eval 6/13 was neg, OK for surg;  MYOVIEW done 7/13 & was a neg nuclear stress study- no EKG changes, EF=69%, normal wall motion... ~  Cards f/u eval 1/14> HBP, CAD s/p IWMI 2007, s/p PCI; note reviewed- stable, no angina, wt down, continue same... EKG showed NSR, rate69, qs in III & aVF, NAD...   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  CEREBROVASCULAR DISEASE (ICD-437.9) - CT Br 2/09 showed sm vessel disease and old lacunar infarct... on ASA + PLAVIX.  MIXED HYPERLIPIDEMIA (ICD-272.2) - on PRAVASTATIN 40mg /d + NIASPAN 500- 2Qhs + FISH OIL daily... ~  FLP 8/08 on Lipitor80 showed TChol 127, TG 201, HDL 34, LDL 61 ~  FLP 7/09 showed TChol 129, TG 265, HDL 36, LDL 65... Lipid Clinic may want to consider a fibrate... ~  FLP 9/09 howed TChol 152, TG 225, HDL 30, LDL 70 ~  FLP 1/10 showed TChol 137, TG 203, HDL 38, LDL 58 ~  FLP 9/10 showed  TChol 154, TG 225, HDL 42, LDL 81 ~  FLP 8/11 showed TChol 118, TG 332, HDL 32, LDL 45... may need fibrate. ~  FLP 2/12 showed TChol 134, TG 257, HDL 33, LDL 58... needs better low fat diet! ~  FLP 8/12 showed TChol 174, TG 320, HDL 41, LDL 75... Ditto ~  FLP 8/13 showed TChol 134, TG 170, HDL 40, LDL 60  DIABETES MELLITUS, TYPE II (ICD-250.00) - on METFORMIN 500mg Bid & GLIMEPIRIDE 1mg /d... ~  labs 8/08 showed BS= 162, HgA1c= 6.5.Marland KitchenMarland Kitchen On Metform500- 1/2 Bid. ~  labs 2/09 showed BS= 154 ~  labs 9/09 (wt=152#) showed BS= 141, HgA1c= 6.1.Marland KitchenMarland Kitchen Continue same. ~  labs 9/10 (wt=126#) showed BS= 136, A1c= 6.1.Marland KitchenMarland Kitchen Great job w/ wt loss. ~  labs 8/11 showed BS= 137, A1c= 7.1 ~  labs 2/12 showed BS= 192, A1c= 7.5.Marland KitchenMarland Kitchen incr Metform500Bid, Add Glimep1mg /d. ~  Labs 8/12 showed BS= 112, A1c= 5.8.Marland KitchenMarland Kitchen rec decr Glimep1mg  to 1/2 tab Qam. ~  3/13: review of EPIC labs shows BS 81-131 over the last 3 months... ~  Labs 8/13 showed BS= 116, A1c= 5.4.Marland KitchenMarland Kitchen rec to decr Glimep1mg  to 1/2 tab daily... ~  2/14: on Metform500bid, Glimep1mg -1/2; labs show BS=106, A1c=5.9 & Rec to stop Glimep.  GASTROESOPHAGEAL REFLUX DISEASE (ICD-530.81) - last EGD 4/08 showed GERD and deformed pylorus... Rx w/ PPI... ~  she had a follow up w/ DrPatterson 7/09 for her GERD- on OMEP 20mg Bid, and IBS- on fiber & Bentyl... ~  5/12:  She had ?gastroenteritis & 10 lb wt loss, treated by TP w/ Cipro, Immodium, Bentyl==> improved.  DIVERTICULAR DISEASE (ICD-562.10) - last colonoscopy 11/04 by DrSam showed divertics only... IBS (ICD-564.1) - on BENTYL 10mg  Tid Prn...  URINARY INCONTINENCE (ICD-788.30) - eval 7/08 by DrTannenbaum- prev pubovag sling surg 2004... she has persist symptoms and he rec physical therapy... ~  she has had recurrent UTIs & Rx w/ Cipro... ~  6/12:  Had f/u Urology for her chr cystitis & incont, tried Gelnique, "no help" per pt & stopped. ~  3/13:  SEE ABOVE ~  10/13: f/u Urology> dysuria, chr cystitis (Klebs), urge incont, atrophic  vaginitis; on Estrace cream, stopped Myrtetriq due to blurred vision;  DEGENERATIVE JOINT DISEASE (ICD-715.90)   < SEE ABOVE > ~  ER eval 12/10 for left knee pain- XRay w/ DJD, sm effusion- refer to Ortho, DrMurphy for shot (helped). ~  Office f/u 7/11 w/ another shot given- consider TKR. ~  3/12:  She had left TKR by DrMurphy, followed by rehab etc & improved... ~  3/13:  SEE ABOVE  NECK PAIN, CHRONIC (ICD-723.1) - s/p neck fusion surg x 2 in 2005 and 2006 by DrHirsh w/ a complex situation involving non-union, severe spondylosis, etc... notes from DrHirsh reviewed> on TRAMADOL 50mg  prn, & METHOCARBAMOL 500mg Qid. ~  saw DrHirsh 9/10 & 5/11- stable, continue conservative management... ~  2/12:  OV c/o some incr neck discomfort & HA> XRays unchanged, rec VICODIN up to 3/d as needed. ~  MRI CSpine 5/13 showed post fusion C2-5 & ant fusion from C4-T1 w/ patent canal... 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  SYNDROME, CHRONIC PAIN (ICD-338.4) - on VICODIN prn, LYRICA 75mg Bid, METHOCARBAMOL...  DIZZINESS, CHRONIC (ICD-780.4) - full eval by DrSethi w/ CT/ MRI  etc... Dx'd w/ benign positional vertigo and Rx'd w/ Eply manuever as needed.  ANXIETY (ICD-300.00) - on EFFEXOR 37.5mg /d for hot flashes & dizziness (she states this helps)... ~  Sep09:  she wants her "Factor V checked"... states that her sister & granddaughter see DrEnnever w/ clots and he thought it would be a good idea to check [name] [name] for Factor V Leiden Mutation (she has never had a venous thromboembolic phenomenon)... 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 LABS 9/09 neg for Factor V mutation.   Past Surgical History  Procedure Laterality Date  . Cervical spine surgery   07/2005    Dr Bonney East  . Bladder repair  2007  . Total abdominal hysterectomy    . Tonsillectomy    . Thumb surgery    . Neck surgery      has had 3 surgeries  . Temporomandibular joint surgery    . Left total knee replacement  11/2010    Dr Eulah Pont  . I&d knee with poly exchange  09/25/2011    Procedure: IRRIGATION AND DEBRIDEMENT KNEE WITH POLY EXCHANGE;  Surgeon: Eulas Post, MD;  Location: [facility] OR;  Service: Orthopedics;  Laterality: Left;  . Cardiac catheterization  2007    Stents  . Appendectomy  1955  . Tonsillectomy  1950  . Cholecystectomy  2007  . Eye surgery  2011    Bilteral  . Joint replacement  2012    left  . Incision and drainage abscess / hematoma of bursa / knee / thigh  09/2011  . Posterior cervical fusion/foraminotomy  04/08/2012    Procedure: POSTERIOR CERVICAL FUSION/FORAMINOTOMY LEVEL 2;  Surgeon: Clydene Fake, MD;  Location: [facility];  Service: Neurosurgery;  Laterality: Left;  Left Cervical six-seven Foraminotomy, bilateral cervical seven-thoracic one foraminotomy, cervical six-seven, cervical seven-thoracic one fusion with posterior instrumentation    Outpatient Encounter Prescriptions as of 10/30/2012  Medication Sig Dispense Refill  . Aloe Vera 25 MG CAPS Take 1 capsule by mouth 2 (two) times daily.       Marland Kitchen amLODipine (NORVASC) 5 MG tablet Take 5 mg by mouth every morning.      . Ascorbic Acid (VITAMIN C) 1000 MG tablet Take 1,000 mg by mouth daily.        Marland Kitchen aspirin EC 81 MG tablet Take 81 mg by mouth every morning.       . calcium carbonate (OS-CAL) 600 MG TABS Take 600 mg by mouth 2 (two) times daily with a meal.        . Cholecalciferol (VITAMIN D3) 1000 UNITS CAPS Take 1 capsule by mouth daily.        . Cinnamon 500 MG capsule Take 500 mg by mouth 2 (two) times daily.       . clopidogrel (PLAVIX) 75 MG tablet TAKE ONE TABLET BY MOUTH EVERY DAY  30 tablet  6  . Cod Liver Oil 1000 MG CAPS Take 1 capsule by mouth 2 (two) times daily.        . Coenzyme  Q10 (CO Q 10) 100 MG CAPS Take 1 capsule by mouth daily.        Marland Kitchen dicyclomine (BENTYL) 10 MG capsule Take 10 mg by mouth 3 (three) times daily. Abdomen cramping      . Flaxseed, Linseed, 1000 MG CAPS Take 1 capsule by mouth 2 (two) times daily.       . furosemide (LASIX) 20 MG  tablet TAKE ONE TABLET BY MOUTH EVERY DAY  30 tablet  10  . Garlic 1000 MG CAPS Take 2 capsules by mouth at bedtime.        Marland Kitchen glimepiride (AMARYL) 1 MG tablet Take 0.5 mg by mouth daily before breakfast.      . Glucosamine-Chondroit-Vit C-Mn (GLUCOSAMINE CHONDR 1500 COMPLX PO) Take 1 capsule by mouth 2 (two) times daily.        Marland Kitchen losartan (COZAAR) 100 MG tablet TAKE ONE TABLET BY MOUTH EVERY DAY  30 tablet  4  . metFORMIN (GLUCOPHAGE) 500 MG tablet Take 500 mg by mouth 2 (two) times daily with a meal.      . methocarbamol (ROBAXIN) 500 MG tablet Take 500 mg by mouth 4 (four) times daily as needed. For pain/ muscle spasms      . metoprolol (LOPRESSOR) 100 MG tablet TAKE ONE TABLET BY MOUTH TWICE DAILY  60 tablet  10  . Multiple Vitamin (STRESSTABS PO) Take 1 tablet by mouth at bedtime.        . niacin (NIASPAN) 500 MG CR tablet Take 1,000 mg by mouth at bedtime.      . nitroGLYCERIN (NITROSTAT) 0.4 MG SL tablet Place 0.4 mg under the tongue every 5 (five) minutes as needed. For chest pain      . Omega-3 Fatty Acids (FISH OIL) 1000 MG CAPS Take 1 capsule by mouth 4 (four) times daily.        Marland Kitchen omeprazole (PRILOSEC) 20 MG capsule TAKE TWO CAPSULES BY MOUTH EVERY DAY  60 capsule  6  . pravastatin (PRAVACHOL) 40 MG tablet Take 40 mg by mouth every evening.      . pregabalin (LYRICA) 75 MG capsule Take 75 mg by mouth 2 (two) times daily.      . psyllium (REGULOID) 0.52 G capsule Take 0.52 g by mouth 2 (two) times daily.        . Red Yeast Rice 600 MG CAPS Take 1 capsule by mouth 2 (two) times daily.        Marland Kitchen trimethoprim (TRIMPEX) 100 MG tablet Take 1 tablet by mouth at bedtime.      Marland Kitchen venlafaxine (EFFEXOR) 37.5 MG tablet TAKE  ONE TABLET BY MOUTH EVERY DAY  30 tablet  5  . [DISCONTINUED] furosemide (LASIX) 20 MG tablet Take 20 mg by mouth every morning.       . [DISCONTINUED] losartan (COZAAR) 100 MG tablet Take 100 mg by mouth every morning.       . [DISCONTINUED] metoprolol (LOPRESSOR) 100 MG tablet Take 100 mg by mouth 2 (two) times daily.      . [DISCONTINUED] omeprazole (PRILOSEC) 20 MG capsule Take 20 mg by mouth 2 (two) times daily.       . [DISCONTINUED] venlafaxine (EFFEXOR) 37.5 MG tablet Take 37.5 mg by mouth every morning.       No facility-administered encounter medications on file as of 10/30/2012.    Allergies  Allergen Reactions  . Codeine     REACTION: nausea  . Niacin And Related     Must take "Flush-free"  . Prednisone     REACTION: rash  . Sulfonamide Derivatives     REACTION: rash  . Tetracycline     REACTION: rash    Current Medications, Allergies, Past Medical History, Past Surgical History, Family History, and Social History were reviewed in [employer] record.    Review of Systems    See HPI - all other  systems neg except as noted...  The patient complains of dyspnea on exertion, headaches, and depression.  The patient denies anorexia, fever, weight loss, weight gain, vision loss, decreased hearing, hoarseness, chest pain, syncope, peripheral edema, prolonged cough, hemoptysis, abdominal pain, melena, hematochezia, severe indigestion/heartburn, hematuria, incontinence, muscle weakness, suspicious skin lesions, transient blindness, difficulty walking, unusual weight change, abnormal bleeding, enlarged lymph nodes, and angioedema.     Objective:   Physical Exam    WD, WN, 72 y/o WF in NAD... GENERAL:  Alert & oriented; pleasant & cooperative... HEENT:  [place]/AT, EOM-wnl, PERRLA, EACs-clear, TMs-wnl, NOSE-clear, THROAT-clear & wnl. NECK:  In Cx collar now w/ decrROM & scars from surg; no JVD; normal carotid impulses w/o bruits;  no thyromegaly or nodules  palpated; no lymphadenopathy. CHEST:  Clear to P & A; without wheezes/ rales/ or rhonchi heard... HEART:  Regular Rhythm; without murmurs/ rubs/ or gallops detected... ABDOMEN:  Soft & nontender; normal bowel sounds; no organomegaly or masses palpated... EXT: s/p LTKR, mod arthritic changes; no varicose veins/ +venous insuffic/ tr edema. She has decr ROM left shoulder & neck, tender left pectoralis muscle & chest wall... NEURO:  CN's intact; no focal neuro deficits... DERM:  No lesions noted; no rash etc...  RADIOLOGY DATA:  Reviewed in the EPIC EMR & discussed w/ the patient...  LABORATORY DATA:  Reviewed in the EPIC EMR & discussed w/ the patient...   Assessment & Plan:    HBP>  BP stable on Rx w/ Metoprolol, Losartan, & Lasix; continue same...  CAD>  On ASA/ Plavix (soon to be restarted) & followed by DrCooper; stable w/o angina, palpit, SOB, edema...  Cerebrovasc dis>  Stable on meds and denies cerebral ischemic symptoms...  HYPERLIPIDEMIA>  TGs still elev & needs better low fat diet; continue same meds for now...  DM>  Improved control & w/ A1c down to 5.8 we will back off on the Glimep now down to 1/2 tab Qam...  GI> GERD, Divertics, IBS>  Stable on Omep & Bentyl...  GU> UTIs, chr cystitis, incont>  Followed & managed by DrTannenbaum...  DJD> s/p left TKR> Infected left TKR prosthesis>  managed by DrMurphy et al & DrCampbell for ID- off all antibiotics now & stable...  [name]>  S/p surgeries w/ fixation, plating, non-union, etc;  followed & managed by DrHirsh ==> repeat surg 8/13 as above...  Anxiety>  On Effexor which helps her hot flashes & dizziness she says...   Patient's Medications  New Prescriptions   No medications on file  Previous Medications   ALOE VERA 25 MG CAPS    Take 1 capsule by mouth 2 (two) times daily.    AMLODIPINE (NORVASC) 5 MG TABLET    Take 5 mg by mouth every morning.   ASCORBIC ACID (VITAMIN C) 1000 MG TABLET    Take 1,000 mg by mouth  daily.     ASPIRIN EC 81 MG TABLET    Take 81 mg by mouth every morning.    CALCIUM CARBONATE (OS-CAL) 600 MG TABS    Take 600 mg by mouth 2 (two) times daily with a meal.     CHOLECALCIFEROL (VITAMIN D3) 1000 UNITS CAPS    Take 1 capsule by mouth daily.     CINNAMON 500 MG CAPSULE    Take 500 mg by mouth 2 (two) times daily.    CLOPIDOGREL (PLAVIX) 75 MG TABLET    TAKE ONE TABLET BY MOUTH EVERY DAY   COD LIVER OIL 1000 MG CAPS  Take 1 capsule by mouth 2 (two) times daily.     COENZYME Q10 (CO Q 10) 100 MG CAPS    Take 1 capsule by mouth daily.     DICYCLOMINE (BENTYL) 10 MG CAPSULE    Take 10 mg by mouth 3 (three) times daily. Abdomen cramping   FLAXSEED, LINSEED, 1000 MG CAPS    Take 1 capsule by mouth 2 (two) times daily.    FUROSEMIDE (LASIX) 20 MG TABLET    TAKE ONE TABLET BY MOUTH EVERY DAY   GARLIC 1000 MG CAPS    Take 2 capsules by mouth at bedtime.     GLIMEPIRIDE (AMARYL) 1 MG TABLET    Take 0.5 mg by mouth daily before breakfast.   GLUCOSAMINE-CHONDROIT-VIT C-MN (GLUCOSAMINE CHONDR 1500 COMPLX PO)    Take 1 capsule by mouth 2 (two) times daily.     LOSARTAN (COZAAR) 100 MG TABLET    TAKE ONE TABLET BY MOUTH EVERY DAY   METFORMIN (GLUCOPHAGE) 500 MG TABLET    Take 500 mg by mouth 2 (two) times daily with a meal.   METHOCARBAMOL (ROBAXIN) 500 MG TABLET    Take 500 mg by mouth 4 (four) times daily as needed. For pain/ muscle spasms   METOPROLOL (LOPRESSOR) 100 MG TABLET    TAKE ONE TABLET BY MOUTH TWICE DAILY   MULTIPLE VITAMIN (STRESSTABS PO)    Take 1 tablet by mouth at bedtime.     NIACIN (NIASPAN) 500 MG CR TABLET    Take 1,000 mg by mouth at bedtime.   NITROGLYCERIN (NITROSTAT) 0.4 MG SL TABLET    Place 0.4 mg under the tongue every 5 (five) minutes as needed. For chest pain   OMEGA-3 FATTY ACIDS (FISH OIL) 1000 MG CAPS    Take 1 capsule by mouth 4 (four) times daily.     OMEPRAZOLE (PRILOSEC) 20 MG CAPSULE    TAKE TWO CAPSULES BY MOUTH EVERY DAY   PRAVASTATIN (PRAVACHOL) 40 MG  TABLET    Take 40 mg by mouth every evening.   PREGABALIN (LYRICA) 75 MG CAPSULE    Take 75 mg by mouth 2 (two) times daily.   PSYLLIUM (REGULOID) 0.52 G CAPSULE    Take 0.52 g by mouth 2 (two) times daily.     RED YEAST RICE 600 MG CAPS    Take 1 capsule by mouth 2 (two) times daily.     TRIMETHOPRIM (TRIMPEX) 100 MG TABLET    Take 1 tablet by mouth at bedtime.   VENLAFAXINE (EFFEXOR) 37.5 MG TABLET    TAKE ONE TABLET BY MOUTH EVERY DAY  Modified Medications   No medications on file  Discontinued Medications   FUROSEMIDE (LASIX) 20 MG TABLET    Take 20 mg by mouth every morning.    LOSARTAN (COZAAR) 100 MG TABLET    Take 100 mg by mouth every morning.    METOPROLOL (LOPRESSOR) 100 MG TABLET    Take 100 mg by mouth 2 (two) times daily.   OMEPRAZOLE (PRILOSEC) 20 MG CAPSULE    Take 20 mg by mouth 2 (two) times daily.    VENLAFAXINE (EFFEXOR) 37.5 MG TABLET    Take 37.5 mg by mouth every morning.

## 2012-11-10 ENCOUNTER — Other Ambulatory Visit: Payer: Self-pay | Admitting: Pulmonary Disease

## 2012-11-11 ENCOUNTER — Ambulatory Visit (INDEPENDENT_AMBULATORY_CARE_PROVIDER_SITE_OTHER): Payer: Medicare Other | Admitting: Physician Assistant

## 2012-11-11 ENCOUNTER — Encounter: Payer: Self-pay | Admitting: Physician Assistant

## 2012-11-11 VITALS — BP 120/70 | HR 66 | Ht 61.0 in | Wt 134.0 lb

## 2012-11-11 DIAGNOSIS — Z1211 Encounter for screening for malignant neoplasm of colon: Secondary | ICD-10-CM | POA: Diagnosis not present

## 2012-11-11 DIAGNOSIS — Z7901 Long term (current) use of anticoagulants: Secondary | ICD-10-CM | POA: Diagnosis not present

## 2012-11-11 MED ORDER — MOVIPREP 100 G PO SOLR: 1.0000 | Freq: Once | ORAL | Status: AC

## 2012-11-11 NOTE — Progress Notes (Signed)
Subjective:    Patient ID: [name] [name], female    DOB: 1940-11-28, 72 y.o.   MRN: [id number]  HPI [name] [name] is a very nice 72 year old white female known remotely to Dr. Victorino Dike and more recently to Dr. Jarold Motto . She  comes in today to discuss followup colonoscopy. She had upper endoscopy done in April of 2008 which was positive for a deformed pylorus with superficial ulcerations. Last colonoscopy was done in November of 2004 and showed only left colon diverticulosis. She does have history of coronary artery disease is status post MI in 2007 and had a PCI with drug-eluting stent to the RCA. She is followed by Dr. Excell Seltzer. She says she has been doing well though has had several surgeries within the past couple of years including bilateral knee replacements and a surgery on her neck. She has no current GI complaints states she takes a probiotic regularly as well as 2 fiber tablets daily and has very regular bowel movements, and has not noted any melena or hematochezia . She is on chronic Plavix.    Review of Systems  Constitutional: Negative.   Eyes: Negative.   Cardiovascular: Negative.   Gastrointestinal: Negative.   Endocrine: Negative.   Genitourinary: Negative.   Allergic/Immunologic: Negative.   Neurological: Negative.   Hematological: Negative.   Psychiatric/Behavioral: Negative.    Outpatient Prescriptions Prior to Visit  Medication Sig Dispense Refill  . Aloe Vera 25 MG CAPS Take 1 capsule by mouth 2 (two) times daily.       Marland Kitchen amLODipine (NORVASC) 5 MG tablet Take 5 mg by mouth every morning.      . Ascorbic Acid (VITAMIN C) 1000 MG tablet Take 1,000 mg by mouth daily.        Marland Kitchen aspirin EC 81 MG tablet Take 81 mg by mouth every morning.       . calcium carbonate (OS-CAL) 600 MG TABS Take 600 mg by mouth 2 (two) times daily with a meal.        . Cholecalciferol (VITAMIN D3) 1000 UNITS CAPS Take 1 capsule by mouth daily.        . Cinnamon 500 MG capsule Take 500 mg by mouth  2 (two) times daily.       . clopidogrel (PLAVIX) 75 MG tablet TAKE ONE TABLET BY MOUTH EVERY DAY  30 tablet  6  . Cod Liver Oil 1000 MG CAPS Take 1 capsule by mouth 2 (two) times daily.        . Coenzyme Q10 (CO Q 10) 100 MG CAPS Take 1 capsule by mouth daily.        Marland Kitchen dicyclomine (BENTYL) 10 MG capsule Take 10 mg by mouth 3 (three) times daily. Abdomen cramping      . Flaxseed, Linseed, 1000 MG CAPS Take 1 capsule by mouth 2 (two) times daily.       . furosemide (LASIX) 20 MG tablet TAKE ONE TABLET BY MOUTH EVERY DAY  30 tablet  10  . Garlic 1000 MG CAPS Take 2 capsules by mouth at bedtime.        . Glucosamine-Chondroit-Vit C-Mn (GLUCOSAMINE CHONDR 1500 COMPLX PO) Take 1 capsule by mouth 2 (two) times daily.        Marland Kitchen losartan (COZAAR) 100 MG tablet TAKE ONE TABLET BY MOUTH EVERY DAY  30 tablet  4  . metFORMIN (GLUCOPHAGE) 500 MG tablet Take 500 mg by mouth 2 (two) times daily with a meal.      .  methocarbamol (ROBAXIN) 500 MG tablet Take 500 mg by mouth 4 (four) times daily as needed. For pain/ muscle spasms      . metoprolol (LOPRESSOR) 100 MG tablet TAKE ONE TABLET BY MOUTH TWICE DAILY  60 tablet  10  . Multiple Vitamin (STRESSTABS PO) Take 1 tablet by mouth at bedtime.        . niacin (NIASPAN) 500 MG CR tablet Take 1,000 mg by mouth at bedtime.      . nitroGLYCERIN (NITROSTAT) 0.4 MG SL tablet Place 0.4 mg under the tongue every 5 (five) minutes as needed. For chest pain      . Omega-3 Fatty Acids (FISH OIL) 1000 MG CAPS Take 1 capsule by mouth 4 (four) times daily.        Marland Kitchen omeprazole (PRILOSEC) 20 MG capsule TAKE TWO CAPSULES BY MOUTH EVERY DAY  60 capsule  6  . pravastatin (PRAVACHOL) 40 MG tablet Take 40 mg by mouth every evening.      . pregabalin (LYRICA) 75 MG capsule Take 75 mg by mouth 2 (two) times daily.      . psyllium (REGULOID) 0.52 G capsule Take 0.52 g by mouth 2 (two) times daily.        . Red Yeast Rice 600 MG CAPS Take 1 capsule by mouth 2 (two) times daily.        Marland Kitchen  trimethoprim (TRIMPEX) 100 MG tablet Take 1 tablet by mouth at bedtime.      Marland Kitchen venlafaxine (EFFEXOR) 37.5 MG tablet TAKE ONE TABLET BY MOUTH EVERY DAY  30 tablet  5   No facility-administered medications prior to visit.   Allergies  Allergen Reactions  . Codeine     REACTION: nausea  . Niacin And Related     Must take "Flush-free"  . Prednisone     REACTION: rash  . Sulfonamide Derivatives     REACTION: rash  . Tetracycline     REACTION: rash   Patient Active Problem List  Diagnosis  . DIABETES MELLITUS, TYPE II  . MIXED HYPERLIPIDEMIA  . ANXIETY  . SYNDROME, CHRONIC PAIN  . HYPERTENSION, UNSPECIFIED  . CORONARY ARTERY DISEASE  . PREMATURE VENTRICULAR CONTRACTIONS  . CEREBROVASCULAR DISEASE  . BRONCHITIS, ACUTE  . GASTROESOPHAGEAL REFLUX DISEASE  . DIVERTICULAR DISEASE  . IBS  . ACUTE CYSTITIS  . DEGENERATIVE JOINT DISEASE  . NECK PAIN, CHRONIC  . DIZZINESS, CHRONIC  . URINARY INCONTINENCE  . MITRAL VALVE PROLAPSE, HX OF  . Diarrhea  . Infection of prosthetic knee joint, left   History  Substance Use Topics  . Smoking status: Never Smoker   . Smokeless tobacco: Never Used  . Alcohol Use: No   family history includes Coronary artery disease in her others and Heart disease in her mother.     Objective:   Physical Exam  Well-developed older white female in no acute stress, quite pleasant blood pressure 120/70 pulse 66 height 5 foot 1 weight 134. HEENT; nontraumatic normocephalic EOMI PERRLA sclera anicteric, Neck; Supple no JVD, Cardiovascula;regular rate and rhythm with S1-S2 no murmur or gallop, Pulmonary; clear bilaterally, Abdomen; soft nontender nondistended bowel sounds are active there is no palpable mass or hepatomegaly,, Rectal; exam not done, Extremities; no clubbing cyanosis or edema skin warm dry, Psych ;mood and affect normal and appropriate.       Assessment & Plan:  #34 72 year old white female who comes in for colon neoplasia screening-last  colonoscopy 2004 negative except diverticulosis. Currently asymptomatic #2 chronic antiplatelet  therapy with Plavix #3 coronary artery disease status post MI 2007 with drug-eluting stent to the RCA #4 diabetes mellitus #5 GERD #6 history of pyloric ulcers 2008-on chronic PPI  Plan; schedule for colonoscopy with Dr. Jarold Motto, procedure was discussed in detail with patient and she is agreeable to proceed We will obtain permission from Dr. Excell Seltzer her cardiologist for her to hold her Plavix 5 days prior to the procedure.

## 2012-11-11 NOTE — Patient Instructions (Addendum)
We sent a prescription for Moviprep to [facility]. You have been scheduled for a colonoscopy with propofol. Please follow written instructions given to you at your visit today.  Please pick up your prep kit at the pharmacy within the next 1-3 days. If you use inhalers (even only as needed), please bring them with you on the day of your procedure.  We will call you when we get a response from Dr. Excell Seltzer regarding the Plavix.

## 2012-11-14 ENCOUNTER — Telehealth: Payer: Self-pay | Admitting: *Deleted

## 2012-11-14 NOTE — Telephone Encounter (Signed)
Dr. Excell Seltzer sent [place] a message back and said: Remote stenting-at low risk of holding plavix 5 days prior to the Endoscopy.  Called pt to advise her she can stop the Plavix on 3-16 and resume it the day after the procedure.

## 2012-11-14 NOTE — Telephone Encounter (Signed)
Message copied by Derry Skill on Fri Nov 14, 2012  3:30 PM ------      Message from: Iona Coach      Created: Fri Nov 14, 2012  9:00 AM      Regarding: see Dr Excell Seltzer response                   ----- Message -----         From: Tonny Bollman, MD         Sent: 11/13/2012  10:59 PM           To: Sharyn Blitz, RN, Derry Skill, CMA            Remote stenting - at low-risk of holding plavix 5 days prior to endoscopy. thx            Tonny Bollman      11/13/2012      10:59 PM            ----- Message -----         From: Derry Skill, CMA         Sent: 11/11/2012  10:47 AM           To: Sharyn Blitz, RN, Tonny Bollman, MD            11/11/2012                        RE: [name] [name]      DOB: Jul 11, 1941      MRN: [id number]                  Dear Dr. Tonny Bollman,                   We have scheduled the above patient for an endoscopic procedure. Our records show that she is on anticoagulation therapy.             Please advise as to how long the patient may come off her therapy of Plavix prior to the procedure, which is scheduled for 11-21-2012.            Please fax back/ or route the completed form to [facility] CMA at [phone].             Sincerely,                        Lowry Ram CMA                        Amy Esterwood PA-C              ------

## 2012-11-21 ENCOUNTER — Encounter: Payer: Medicare Other | Admitting: Gastroenterology

## 2012-11-24 ENCOUNTER — Other Ambulatory Visit: Payer: Self-pay | Admitting: Pulmonary Disease

## 2012-11-26 DIAGNOSIS — N3020 Other chronic cystitis without hematuria: Secondary | ICD-10-CM | POA: Diagnosis not present

## 2012-12-02 ENCOUNTER — Encounter: Payer: Self-pay | Admitting: Internal Medicine

## 2012-12-02 ENCOUNTER — Ambulatory Visit (AMBULATORY_SURGERY_CENTER): Payer: Medicare Other | Admitting: Internal Medicine

## 2012-12-02 ENCOUNTER — Other Ambulatory Visit: Payer: Self-pay | Admitting: Internal Medicine

## 2012-12-02 VITALS — BP 118/68 | HR 66 | Temp 97.9°F | Resp 25 | Ht 61.0 in | Wt 134.0 lb

## 2012-12-02 DIAGNOSIS — D126 Benign neoplasm of colon, unspecified: Secondary | ICD-10-CM

## 2012-12-02 DIAGNOSIS — I10 Essential (primary) hypertension: Secondary | ICD-10-CM | POA: Diagnosis not present

## 2012-12-02 DIAGNOSIS — I059 Rheumatic mitral valve disease, unspecified: Secondary | ICD-10-CM | POA: Diagnosis not present

## 2012-12-02 DIAGNOSIS — Z1211 Encounter for screening for malignant neoplasm of colon: Secondary | ICD-10-CM

## 2012-12-02 DIAGNOSIS — I4949 Other premature depolarization: Secondary | ICD-10-CM | POA: Diagnosis not present

## 2012-12-02 DIAGNOSIS — K5289 Other specified noninfective gastroenteritis and colitis: Secondary | ICD-10-CM | POA: Diagnosis not present

## 2012-12-02 DIAGNOSIS — K529 Noninfective gastroenteritis and colitis, unspecified: Secondary | ICD-10-CM

## 2012-12-02 DIAGNOSIS — F411 Generalized anxiety disorder: Secondary | ICD-10-CM | POA: Diagnosis not present

## 2012-12-02 DIAGNOSIS — G894 Chronic pain syndrome: Secondary | ICD-10-CM | POA: Diagnosis not present

## 2012-12-02 DIAGNOSIS — K5730 Diverticulosis of large intestine without perforation or abscess without bleeding: Secondary | ICD-10-CM | POA: Diagnosis not present

## 2012-12-02 DIAGNOSIS — I2510 Atherosclerotic heart disease of native coronary artery without angina pectoris: Secondary | ICD-10-CM | POA: Diagnosis not present

## 2012-12-02 DIAGNOSIS — K559 Vascular disorder of intestine, unspecified: Secondary | ICD-10-CM | POA: Diagnosis not present

## 2012-12-02 LAB — HM COLONOSCOPY

## 2012-12-02 MED ORDER — SODIUM CHLORIDE 0.9 % IV SOLN: 500.0000 mL | INTRAVENOUS | Status: DC

## 2012-12-02 NOTE — Patient Instructions (Addendum)
YOU HAD AN ENDOSCOPIC PROCEDURE TODAY AT [facility]: Refer to the procedure report that was given to you for any specific questions about what was found during the examination.  If the procedure report does not answer your questions, please call your gastroenterologist to clarify.  If you requested that your care partner not be given the details of your procedure findings, then the procedure report has been included in a sealed envelope for you to review at your convenience later.  YOU SHOULD EXPECT: Some feelings of bloating in the abdomen. Passage of more gas than usual.  Walking can help get rid of the air that was put into your GI tract during the procedure and reduce the bloating. If you had a lower endoscopy (such as a colonoscopy or flexible sigmoidoscopy) you may notice spotting of blood in your stool or on the toilet paper. If you underwent a bowel prep for your procedure, then you may not have a normal bowel movement for a few days.  DIET: Your first meal following the procedure should be a light meal and then it is ok to progress to your normal diet.  A half-sandwich or bowl of soup is an example of a good first meal.  Heavy or fried foods are harder to digest and may make you feel nauseous or bloated.  Likewise meals heavy in dairy and vegetables can cause extra gas to form and this can also increase the bloating.  Drink plenty of fluids but you should avoid alcoholic beverages for 24 hours.  ACTIVITY: Your care partner should take you home directly after the procedure.  You should plan to take it easy, moving slowly for the rest of the day.  You can resume normal activity the day after the procedure however you should NOT DRIVE or use heavy machinery for 24 hours (because of the sedation medicines used during the test).    SYMPTOMS TO REPORT IMMEDIATELY: A gastroenterologist can be reached at any hour.  During normal business hours, 8:30 AM to 5:00 PM Monday through Friday,  call [phone].  After hours and on weekends, please call the GI answering service at [phone]  Emergency number who will take a message and have the physician on call contact you.   Following lower endoscopy (colonoscopy or flexible sigmoidoscopy):  Excessive amounts of blood in the stool  Significant tenderness or worsening of abdominal pains  Swelling of the abdomen that is new, acute  Fever of 100F or higher  Black, tarry-looking stools  FOLLOW UP: If any biopsies were taken you will be contacted by phone or by letter within the next 1-3 weeks.  Call your gastroenterologist if you have not heard about the biopsies in 3 weeks.  Our staff will call the home number listed on your records the next business day following your procedure to check on you and address any questions or concerns that you may have at that time regarding the information given to you following your procedure. This is a courtesy call and so if there is no answer at the home number and we have not heard from you through the emergency physician on call, we will assume that you have returned to your regular daily activities without incident.  SIGNATURES/CONFIDENTIALITY: You and/or your care partner have signed paperwork which will be entered into your electronic medical record.  These signatures attest to the fact that that the information above on your After Visit Summary has been reviewed and is understood.  Full responsibility of the confidentiality of this discharge information lies with you and/or your care-partner.  Avoid all NSAIDS like motrin,aleve,advil, goody's powders or any prescription medicines that have anti inflammatory ingredients  Okay to resume Plavix (clopidogrel) on Thursday April 3,2014

## 2012-12-02 NOTE — Progress Notes (Signed)
Patient did not experience any of the following events: a burn prior to discharge; a fall within the facility; wrong site/side/patient/procedure/implant event; or a hospital transfer or hospital admission upon discharge from the facility. (G8907)Patient did not have preoperative order for IV antibiotic SSI prophylaxis. (G8918) ewm 

## 2012-12-02 NOTE — Op Note (Signed)
[facility] [address] [place], [address]   COLONOSCOPY PROCEDURE REPORT  PATIENT: [name], [name]  MR#: [id number] BIRTHDATE: [birth year] , 72  yrs. old GENDER: Female ENDOSCOPIST: Beverley Fiedler, MD PROCEDURE DATE:  12/02/2012 PROCEDURE:   Colonoscopy with snare polypectomy and Colonoscopy with biopsy ASA CLASS:   Class III INDICATIONS:average risk screening and Last colonoscopy performed 10 years ago. MEDICATIONS: MAC sedation, administered by CRNA and propofol (Diprivan) 300mg  IV  DESCRIPTION OF PROCEDURE:   After the risks benefits and alternatives of the procedure were thoroughly explained, informed consent was obtained.  A digital rectal exam revealed no rectal mass.   The LB [id number]  endoscope was introduced through the anus and advanced to the cecum, which was identified by both the appendix and ileocecal valve. No adverse events experienced. The quality of the prep was poor in the right colon and good in the left, using MoviPrep  The instrument was then slowly withdrawn as the colon was fully examined.      COLON FINDINGS: The colonic mucosa was difficult to examine in the cecum due to to thick adherent stool.  Biopsies were taken from the cecum.  The mucosa appeared normal in the ascending colon, and proximal transverse colon.   Abnormal mucosa was found in the descending colon and sigmoid colon (from approximately 25 cm to 45 cm from the dentate line).  The mucosa was erythematous and had loss of vascularity and erosions in a patchy distribution. Multiple biopsies were performed using cold forceps.   Mild diverticulosis was noted in the sigmoid colon (the segment involved with diverticular disease did not appear inflamed).   The colonic mucosa appeared normal in the distal sigmoid colon and rectum.   A sessile polyp measuring 5 mm in size was found at the splenic flexure.  A polypectomy was performed with a cold snare.   The resection was complete and the polyp tissue was completely retrieved.  Retroflexed views revealed no abnormalities. The time to cecum=7 minutes 27 seconds.  Withdrawal time=11 minutes 12 seconds.  The scope was withdrawn and the procedure completed.  COMPLICATIONS: There were no complications.  ENDOSCOPIC IMPRESSION: 1.   Poor preparation in the cecum and proximal ascending colon, otherwise normal appearing mucosa in the ascending colon and transverse colon; multiple biopsies were performed from the cecum 2.   Abnormal mucosa was found in the descending colon and sigmoid colon; multiple biopsies were performed using cold forceps 3.   Mild diverticulosis was noted in the sigmoid colon 4.   The colonic mucosa appeared normal in the distal sigmoid colon and rectum 5.   Sessile polyp measuring 5 mm in size was found at the splenic flexure; polypectomy was performed with a cold snare  RECOMMENDATIONS: 1.  Await pathology results 2.  Avoid NSAIDS 3.  High fiber diet 4.  Timing of repeat colonoscopy will be determined by pathology findings. 5.  You will receive a letter within 1-2 weeks with the results of your biopsy as well as final recommendations.  Please call my office if you have not received a letter after 3 weeks. 6.  Okay to resume Plavix (clopidogrel) on Thursday morning, December 04, 2012   eSigned:  Beverley Fiedler, MD 12/02/2012 1:53 PM   cc: The Patient, Sheryn Bison, MD, [place], Amy PA-C, and Michele Mcalpine, MD   PATIENT NAME:  [name], [name] MR#: [id number]

## 2012-12-03 ENCOUNTER — Telehealth: Payer: Self-pay | Admitting: *Deleted

## 2012-12-03 ENCOUNTER — Encounter: Payer: Medicare Other | Admitting: Gastroenterology

## 2012-12-03 NOTE — Telephone Encounter (Signed)
  Follow up Call-  Call back number 12/02/2012  Post procedure Call Back phone  # [phone]  Permission to leave phone message Yes     Patient questions:  Do you have a fever, pain , or abdominal swelling? no Pain Score  0 *  Have you tolerated food without any problems? yes  Have you been able to return to your normal activities? yes  Do you have any questions about your discharge instructions: Diet   no Medications  no Follow up visit  no  Do you have questions or concerns about your Care? no  Actions: * If pain score is 4 or above: No action needed, pain <4.

## 2012-12-10 ENCOUNTER — Encounter: Payer: Self-pay | Admitting: Internal Medicine

## 2012-12-11 ENCOUNTER — Other Ambulatory Visit: Payer: Self-pay | Admitting: Pulmonary Disease

## 2012-12-31 ENCOUNTER — Encounter: Payer: Self-pay | Admitting: Internal Medicine

## 2013-01-05 ENCOUNTER — Ambulatory Visit (INDEPENDENT_AMBULATORY_CARE_PROVIDER_SITE_OTHER): Payer: Medicare Other | Admitting: Internal Medicine

## 2013-01-05 ENCOUNTER — Encounter: Payer: Self-pay | Admitting: Internal Medicine

## 2013-01-05 VITALS — BP 122/66 | HR 68 | Ht 60.0 in | Wt 135.0 lb

## 2013-01-05 DIAGNOSIS — K559 Vascular disorder of intestine, unspecified: Secondary | ICD-10-CM | POA: Diagnosis not present

## 2013-01-05 DIAGNOSIS — Z86010 Personal history of colonic polyps: Secondary | ICD-10-CM | POA: Diagnosis not present

## 2013-01-05 NOTE — Patient Instructions (Addendum)
Call if you experience any abdominal pain or there is any significant change in your bowel habits.  [phone]                                               We are excited to introduce MyChart, a new best-in-class service that provides you online access to important information in your electronic medical record. We want to make it easier for you to view your health information - all in one secure location - when and where you need it. We expect MyChart will enhance the quality of care and service we provide.  When you register for MyChart, you can:    View your test results.    Request appointments and receive appointment reminders via email.    Request medication renewals.    View your medical history, allergies, medications and immunizations.    Communicate with your physician's office through a password-protected site.    Conveniently print information such as your medication lists.  To find out if MyChart is right for you, please talk to a member of our clinical staff today. We will gladly answer your questions about this free health and wellness tool.  If you are age 15 or older and want a member of your family to have access to your record, you must provide written consent by completing a proxy form available at our office. Please speak to our clinical staff about guidelines regarding accounts for patients younger than age 46.  As you activate your MyChart account and need any technical assistance, please call the MyChart technical support line at [phone]) [phone]-CHART [phone]) or email your question to [email or web address]. If you email your question(s), please include your name, a return phone number and the best time to reach you.  If you have non-urgent health-related questions, you can send a message to our office through MyChart at [email or web address]. If you have a medical emergency, call 911.  Thank you for using MyChart as your new health and wellness  resource!   MyChart licensed from [employer],  [id number]. Patents Pending.

## 2013-01-05 NOTE — Progress Notes (Signed)
Patient ID: [name] [name], female   DOB: 05-27-1941, 72 y.o.   MRN: [id number] HPI: [name] [name] is a 72 year old female with a past medical history of CAD on Plavix, hypertension, hyperlipidemia, diabetes, IBS, chronic kidney disease who is seen in followup after a screening colonoscopy. She is here today with her husband. Colonoscopy was performed on 12/02/2012 which revealed abnormal mucosa with erythema in the descending and sigmoid colon over approximate 20 cm segment, mild diverticulosis in the sigmoid, and a 5 mm sessile tubular adenoma at the splenic flexure. Biopsies from the erythema revealed changes consistent with ischemic colitis. The patient denies any specific complaints of abdominal pain or rectal bleeding.  She reports she is eating well without nausea or vomiting. No significant weight loss. No trouble with diarrhea or constipation. No prior history of known ischemic colitis episodes. Family history of IBD.  He does have a history of irritable bowel syndrome which is primarily associated with bloating and occasional abdominal cramping. This has not been a big issue of late.  Past Medical History  Diagnosis Date  . HTN (hypertension)   . CAD (coronary artery disease)     1.  inf MI 2007 - tx with DES to RCA;  2.  Echocardiogram 08/2007: EF 60%, mild MR, mild LAE.  3.  Last LHC 08/2009: dLM 20%, LAD beyond diagonal 60-70%, stable from prior catheterization in 2007, ostial circumflex 40-50%, mid RCA stent okay, EF 65%. ;  4.  myoview was recommended 08/2009: EF 76%, small inferoapical fixed defect, no ischemia  5.  Myoview 7/13: no scar or ischemia, EF 69%  . Premature ventricular contractions   . Cerebrovascular disease, unspecified   . Mixed hyperlipidemia   . DM type 2 (diabetes mellitus, type 2)   . GERD (gastroesophageal reflux disease)   . Diverticular disease   . IBS (irritable bowel syndrome)   . Urinary incontinence   . DJD (degenerative joint disease)   . Chronic neck pain    . Chronic pain syndrome   . Dizziness   . Anxiety   . 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 H/O hiatal hernia   . Nocturia   . Chronic kidney disease     frequent Kidney Infections  . Pancreatitis     1955 an once more  . PONV (postoperative nausea and vomiting)     Difficluty opening mouth wide and turning head. (Cervical Fusion)  . Mitral valve prolapse     Past Surgical History  Procedure Laterality Date  . Cervical spine surgery  07/2005    Dr Ane East  . Bladder repair  2007  . Total abdominal hysterectomy    . Tonsillectomy    . Thumb surgery    . Neck surgery      has had 3 surgeries  . Temporomandibular joint surgery    . Left total knee replacement  11/2010    Dr Eulah Pont  . I&d knee with poly exchange  09/25/2011    Procedure: IRRIGATION AND DEBRIDEMENT KNEE WITH POLY EXCHANGE;  Surgeon: Eulas Post, MD;  Location: [facility] OR;  Service: Orthopedics;  Laterality: Left;  . Cardiac catheterization  2007    Stents  . Appendectomy  1955  . Tonsillectomy  1950  . Cholecystectomy  2007  . Eye surgery  2011    Bilteral  . Joint replacement  2012    left  . Incision and drainage abscess / hematoma of bursa / knee / thigh  09/2011  .  Posterior cervical fusion/foraminotomy  04/08/2012    Procedure: POSTERIOR CERVICAL FUSION/FORAMINOTOMY LEVEL 2;  Surgeon: Clydene Fake, MD;  Location: [facility];  Service: Neurosurgery;  Laterality: Left;  Left Cervical six-seven Foraminotomy, bilateral cervical seven-thoracic one foraminotomy, cervical six-seven, cervical seven-thoracic one fusion with posterior instrumentation    Current Outpatient Prescriptions  Medication Sig Dispense Refill  . Aloe Vera 25 MG CAPS Take 1 capsule by mouth 2 (two) times daily.       Marland Kitchen amLODipine (NORVASC) 5 MG tablet Take 5 mg by mouth every morning.      . Ascorbic Acid (VITAMIN C) 1000 MG tablet Take 1,000 mg by mouth daily.        Marland Kitchen aspirin EC 81 MG tablet Take 81 mg by mouth every  morning.       . calcium carbonate (OS-CAL) 600 MG TABS Take 600 mg by mouth 2 (two) times daily with a meal.        . Cholecalciferol (VITAMIN D3) 1000 UNITS CAPS Take 1 capsule by mouth daily.        . Cinnamon 500 MG capsule Take 500 mg by mouth 2 (two) times daily.       . clopidogrel (PLAVIX) 75 MG tablet TAKE ONE TABLET BY MOUTH EVERY DAY  30 tablet  6  . Cod Liver Oil 1000 MG CAPS Take 1 capsule by mouth 2 (two) times daily.        . Coenzyme Q10 (CO Q 10) 100 MG CAPS Take 1 capsule by mouth daily.        Marland Kitchen dicyclomine (BENTYL) 10 MG capsule Take 10 mg by mouth 3 (three) times daily. Abdomen cramping      . dicyclomine (BENTYL) 10 MG capsule TAKE 1 CAPSULE BY MOUTH UP TO 3 TIMES DAILY AS NEEDED FOR ABDOMINAL CRAMPING.  90 capsule  0  . Flaxseed, Linseed, 1000 MG CAPS Take 1 capsule by mouth 2 (two) times daily.       . furosemide (LASIX) 20 MG tablet TAKE ONE TABLET BY MOUTH EVERY DAY  30 tablet  10  . Garlic 1000 MG CAPS Take 2 capsules by mouth at bedtime.        . Glucosamine-Chondroit-Vit C-Mn (GLUCOSAMINE CHONDR 1500 COMPLX PO) Take 1 capsule by mouth 2 (two) times daily.        Marland Kitchen losartan (COZAAR) 100 MG tablet TAKE ONE TABLET BY MOUTH EVERY DAY  30 tablet  4  . metFORMIN (GLUCOPHAGE) 500 MG tablet Take 500 mg by mouth 2 (two) times daily with a meal.      . metFORMIN (GLUCOPHAGE) 500 MG tablet TAKE ONE TABLET BY MOUTH TWICE DAILY  60 tablet  5  . methocarbamol (ROBAXIN) 500 MG tablet Take 500 mg by mouth 4 (four) times daily as needed. For pain/ muscle spasms      . metoprolol (LOPRESSOR) 100 MG tablet TAKE ONE TABLET BY MOUTH TWICE DAILY  60 tablet  10  . Multiple Vitamin (STRESSTABS PO) Take 1 tablet by mouth at bedtime.        . niacin (NIASPAN) 500 MG CR tablet Take 1,000 mg by mouth at bedtime.      . nitroGLYCERIN (NITROSTAT) 0.4 MG SL tablet Place 0.4 mg under the tongue every 5 (five) minutes as needed. For chest pain      . Omega-3 Fatty Acids (FISH OIL) 1000 MG CAPS Take  1 capsule by mouth 4 (four) times daily.        Marland Kitchen  omeprazole (PRILOSEC) 20 MG capsule TAKE TWO CAPSULES BY MOUTH EVERY DAY  60 capsule  6  . pravastatin (PRAVACHOL) 40 MG tablet Take 40 mg by mouth every evening.      . pregabalin (LYRICA) 75 MG capsule Take 75 mg by mouth 2 (two) times daily.      . psyllium (REGULOID) 0.52 G capsule Take 0.52 g by mouth 2 (two) times daily.        . Red Yeast Rice 600 MG CAPS Take 1 capsule by mouth 2 (two) times daily.        Marland Kitchen trimethoprim (TRIMPEX) 100 MG tablet Take 1 tablet by mouth at bedtime.      Marland Kitchen venlafaxine (EFFEXOR) 37.5 MG tablet TAKE ONE TABLET BY MOUTH EVERY DAY  30 tablet  5  . [DISCONTINUED] enoxaparin (LOVENOX) 40 MG/0.4ML SOLN Inject 0.4 mLs (40 mg total) into the skin daily.  5 Syringe  0   No current facility-administered medications for this visit.    Allergies  Allergen Reactions  . Codeine     REACTION: nausea  . Niacin And Related     Must take "Flush-free"  . Prednisone     REACTION: rash  . Sulfonamide Derivatives     REACTION: rash  . Tetracycline     REACTION: rash    Family History  Problem Relation Age of Onset  . Heart disease Mother   . Coronary artery disease Other     sibling  . Coronary artery disease Other     sibling    History  Substance Use Topics  . Smoking status: Never Smoker   . Smokeless tobacco: Never Used  . Alcohol Use: No    ROS: As per history of present illness, otherwise negative  BP 122/66  Pulse 68  Ht 5' (1.524 m)  Wt 135 lb (61.236 kg)  BMI 26.37 kg/m2 Constitutional: Well-developed and well-nourished. No distress. HEENT: Normocephalic and atraumatic.No scleral icterus. Neck: Neck supple. Trachea midline. Cardiovascular: Normal rate, regular rhythm and intact distal pulses.  Pulmonary/chest: Effort normal and breath sounds normal. No wheezing, rales or rhonchi. Abdominal: Soft, very mild left-sided tenderness without rebound or guarding, nondistended. Bowel sounds active  throughout.  Extremities: no clubbing, cyanosis, or edema Neurological: Alert and oriented to person place and time. Skin: Skin is warm and dry. No rashes noted. Psychiatric: Normal mood and affect. Behavior is normal.  RELEVANT LABS AND IMAGING: CBC    Component Value Date/Time   WBC 10.9* 04/29/2012 1032   RBC 4.09 04/29/2012 1032   HGB 13.1 04/29/2012 1032   HCT 39.7 04/29/2012 1032   PLT 427.0* 04/29/2012 1032   MCV 97.2 04/29/2012 1032   MCH 32.5 04/02/2012 0948   MCHC 33.0 04/29/2012 1032   RDW 13.7 04/29/2012 1032   LYMPHSABS 3.1 04/29/2012 1032   MONOABS 0.8 04/29/2012 1032   EOSABS 0.2 04/29/2012 1032   BASOSABS 0.1 04/29/2012 1032    CMP     Component Value Date/Time   NA 139 10/30/2012 1343   K 3.8 10/30/2012 1343   CL 97 10/30/2012 1343   CO2 34* 10/30/2012 1343   GLUCOSE 106* 10/30/2012 1343   GLUCOSE 126* 06/24/2006 0738   BUN 10 10/30/2012 1343   CREATININE 0.8 10/30/2012 1343   CALCIUM 9.1 10/30/2012 1343   PROT 7.0 04/29/2012 1032   ALBUMIN 3.9 04/29/2012 1032   AST 23 04/29/2012 1032   ALT 24 04/29/2012 1032   ALKPHOS 72 04/29/2012 1032   BILITOT 0.6  04/29/2012 1032   GFRNONAA 65* 04/02/2012 0948   GFRAA 75* 04/02/2012 0948    ASSESSMENT/PLAN:  72 year old female with a past medical history of CAD on Plavix, hypertension, hyperlipidemia, diabetes, IBS, chronic kidney disease who is seen in followup after a screening colonoscopy.  1.  Ischemic colitis -- this was mild and incidentally found at the time of colonoscopy. She is not having symptoms or bleeding consistent with chronic ischemia.  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  Given the lack of symptoms, I do not think anything further needs to be done at this time. I have recommended that she watch for abdominal pain, change in bowel habit, or any evidence of rectal bleeding/blood in her stools. This occurs she should notify  us immediately. She voices understanding. She is taking a probiotic as well as daily fiber supplement, which has helped her avoid constipation.  2.  Adenomatous colon polyp -- she did have one adenomatous colon polyp removed which was small. Normally we would repeat colonoscopy for surveillance in 5 years, however based on her poor preparation in the right colon I recommended a shorter interval. We discussed this, and we'll plan to repeat the test in 3 years. She understands and is agreeable with this plan.  She can followup as needed

## 2013-01-08 ENCOUNTER — Other Ambulatory Visit: Payer: Self-pay | Admitting: Pulmonary Disease

## 2013-01-14 ENCOUNTER — Other Ambulatory Visit: Payer: Self-pay | Admitting: Pulmonary Disease

## 2013-01-20 ENCOUNTER — Other Ambulatory Visit: Payer: Self-pay | Admitting: Pulmonary Disease

## 2013-01-23 ENCOUNTER — Other Ambulatory Visit: Payer: Self-pay | Admitting: Cardiovascular Disease

## 2013-02-03 ENCOUNTER — Other Ambulatory Visit: Payer: Self-pay | Admitting: Pulmonary Disease

## 2013-03-10 ENCOUNTER — Other Ambulatory Visit: Payer: Self-pay | Admitting: Pulmonary Disease

## 2013-03-18 ENCOUNTER — Other Ambulatory Visit: Payer: Self-pay | Admitting: Cardiovascular Disease

## 2013-04-29 ENCOUNTER — Ambulatory Visit: Payer: Medicare Other | Admitting: Pulmonary Disease

## 2013-05-04 HISTORY — PX: DENTAL SURGERY: SHX609

## 2013-05-18 ENCOUNTER — Other Ambulatory Visit: Payer: Self-pay | Admitting: Pulmonary Disease

## 2013-05-28 ENCOUNTER — Other Ambulatory Visit: Payer: Self-pay | Admitting: *Deleted

## 2013-05-28 MED ORDER — METFORMIN HCL 500 MG PO TABS: ORAL_TABLET | ORAL | Status: DC

## 2013-06-15 ENCOUNTER — Ambulatory Visit (INDEPENDENT_AMBULATORY_CARE_PROVIDER_SITE_OTHER): Payer: Medicare Other | Admitting: Pulmonary Disease

## 2013-06-15 ENCOUNTER — Encounter: Payer: Self-pay | Admitting: Pulmonary Disease

## 2013-06-15 ENCOUNTER — Other Ambulatory Visit (INDEPENDENT_AMBULATORY_CARE_PROVIDER_SITE_OTHER): Payer: Medicare Other

## 2013-06-15 VITALS — BP 130/68 | HR 68 | Temp 98.3°F | Ht 61.0 in | Wt 134.4 lb

## 2013-06-15 DIAGNOSIS — E559 Vitamin D deficiency, unspecified: Secondary | ICD-10-CM

## 2013-06-15 DIAGNOSIS — E119 Type 2 diabetes mellitus without complications: Secondary | ICD-10-CM

## 2013-06-15 DIAGNOSIS — E782 Mixed hyperlipidemia: Secondary | ICD-10-CM

## 2013-06-15 DIAGNOSIS — I10 Essential (primary) hypertension: Secondary | ICD-10-CM

## 2013-06-15 DIAGNOSIS — N3000 Acute cystitis without hematuria: Secondary | ICD-10-CM | POA: Diagnosis not present

## 2013-06-15 DIAGNOSIS — G894 Chronic pain syndrome: Secondary | ICD-10-CM

## 2013-06-15 DIAGNOSIS — I679 Cerebrovascular disease, unspecified: Secondary | ICD-10-CM

## 2013-06-15 DIAGNOSIS — M542 Cervicalgia: Secondary | ICD-10-CM

## 2013-06-15 DIAGNOSIS — I2510 Atherosclerotic heart disease of native coronary artery without angina pectoris: Secondary | ICD-10-CM | POA: Diagnosis not present

## 2013-06-15 DIAGNOSIS — F411 Generalized anxiety disorder: Secondary | ICD-10-CM

## 2013-06-15 DIAGNOSIS — Z860100 Personal history of colon polyps, unspecified: Secondary | ICD-10-CM

## 2013-06-15 DIAGNOSIS — M545 Low back pain, unspecified: Secondary | ICD-10-CM

## 2013-06-15 DIAGNOSIS — K559 Vascular disorder of intestine, unspecified: Secondary | ICD-10-CM

## 2013-06-15 DIAGNOSIS — K219 Gastro-esophageal reflux disease without esophagitis: Secondary | ICD-10-CM

## 2013-06-15 DIAGNOSIS — K5730 Diverticulosis of large intestine without perforation or abscess without bleeding: Secondary | ICD-10-CM

## 2013-06-15 DIAGNOSIS — Z23 Encounter for immunization: Secondary | ICD-10-CM | POA: Diagnosis not present

## 2013-06-15 DIAGNOSIS — M1990 Unspecified osteoarthritis, unspecified site: Secondary | ICD-10-CM

## 2013-06-15 LAB — BASIC METABOLIC PANEL
CO2: 32 mEq/L (ref 19–32)
Chloride: 100 mEq/L (ref 96–112)
GFR: 70.52 mL/min (ref >60.00)
Glucose, Bld: 105 mg/dL — ABNORMAL HIGH (ref 70–99)
Potassium: 4.4 mEq/L (ref 3.5–5.1)
Sodium: 141 mEq/L (ref 135–145)

## 2013-06-15 LAB — URINALYSIS, ROUTINE W REFLEX MICROSCOPIC
Hgb urine dipstick: NEGATIVE
Ketones, ur: NEGATIVE
Nitrite: POSITIVE
Specific Gravity, Urine: 1.01 (ref 1.000–1.030)
Total Protein, Urine: NEGATIVE
Urine Glucose: NEGATIVE

## 2013-06-15 LAB — HEPATIC FUNCTION PANEL
ALT: 20 U/L (ref 0–35)
AST: 22 U/L (ref 0–37)
Albumin: 4.4 g/dL (ref 3.5–5.2)
Alkaline Phosphatase: 61 U/L (ref 39–117)
Bilirubin, Direct: 0.1 mg/dL (ref 0.0–0.3)
Total Bilirubin: 0.7 mg/dL (ref 0.3–1.2)

## 2013-06-15 LAB — LIPID PANEL
Total CHOL/HDL Ratio: 3
VLDL: 32.6 mg/dL (ref 0.0–40.0)

## 2013-06-15 LAB — TSH: TSH: 2.52 u[IU]/mL (ref 0.35–5.50)

## 2013-06-15 LAB — CBC WITH DIFFERENTIAL/PLATELET
Basophils Absolute: 0.1 10*3/uL (ref 0.0–0.1)
HCT: 40.5 % (ref 36.0–46.0)
Lymphs Abs: 3.9 10*3/uL (ref 0.7–4.0)
MCV: 91 fl (ref 78.0–100.0)
Monocytes Absolute: 0.7 10*3/uL (ref 0.1–1.0)
Monocytes Relative: 7.4 % (ref 3.0–12.0)
Neutrophils Relative %: 48.6 % (ref 43.0–77.0)
Platelets: 302 10*3/uL (ref 150.0–400.0)
RDW: 13.7 % (ref 11.5–14.6)

## 2013-06-15 MED ORDER — GABAPENTIN 300 MG PO CAPS: 300.0000 mg | ORAL_CAPSULE | Freq: Two times a day (BID) | ORAL | Status: DC

## 2013-06-15 MED ORDER — DICYCLOMINE HCL 10 MG PO CAPS: ORAL_CAPSULE | ORAL | Status: DC

## 2013-06-15 MED ORDER — TRAMADOL HCL 50 MG PO TABS: 50.0000 mg | ORAL_TABLET | Freq: Three times a day (TID) | ORAL | Status: DC | PRN

## 2013-06-15 MED ORDER — FUROSEMIDE 20 MG PO TABS: ORAL_TABLET | ORAL | Status: DC

## 2013-06-15 MED ORDER — OMEPRAZOLE 20 MG PO CPDR: DELAYED_RELEASE_CAPSULE | ORAL | Status: DC

## 2013-06-15 NOTE — Patient Instructions (Signed)
Today we updated your med list in our EPIC system...    Continue your current medications the same...  Today we did a urinalysis & culture to check for nfection... Please return to our lab one morning this week for your f/u FASTING blood work...    We will contact you w/ the results when available...   We wrote a new prescription for TRAMADOL to try for your pain...    Take one tab up to 3 times daily as needed...  We switched your Lyrica ($$) to Gabapentin 300mg  twice daily to see if this works as well...  We gave you the 2014 Flu vaccine today...  Call for any questions...  Let's plan a follow up visit in 23mo, sooner if needed for problems.Marland KitchenMarland Kitchen

## 2013-06-15 NOTE — Progress Notes (Signed)
Subjective:    Patient ID: [name] [name], female    DOB: 02-22-1941, 72 y.o.   MRN: [id number]  HPI 72 y/o WF here for a follow up visit... he has multiple medical problems as noted below...      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 they started Rx w/ phys therapy.    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    She is c/o soreness & tenderness in her left armpit & across her chest; the discomfort comes & goes, not ppt by exercise, she has additional neck pain that radiates into her left shoulder area & is followed by DrHirsh- seen 11/12 for her nonunion & spondylosis, on Arthrotec & Robaxin;  Additionally DrTomblin sent her for a mammogram & it was neg; they set up this f/u appt w/ me; she has hx mult somatic complaints & as noted mult specialists tending to her  needs...    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  NOTE> the knee prob developed after she was seen by Cards in Jan; her Plavix is on hold now because she is still on Coumadin due to her knee surg- she was told to "finish this up" by DrLandau & apparently she had a lot of the Coumadin left (she is almost out now & will switch back to Plavix)...     She also saw Urology, DrTannenbaum 12/12 w/ recurrent infections, chronic cystitis, urge incont, vag dryness & dyspareunia; on Premarin vag cream, & failed Gelnique & Vesicare Rx; she also took Trimethoprim Qhs for suppression but had a pansens Klebs UTI anyway that was treated w/ Cipro; the Trimethoprim is on hold while she is taking the Levaquin; DrT started her on Myrbetriq 50mg /d as a trial...     She has an impressive med list w/ >30 entries but many are supplements and herbs which she is not inclined to discontinue... EKG today showed NSR, rate70, poor R progression, otherw neg EKG, NAD.Marland KitchenMarland Kitchen  ~  April 29, 2012:  71mo ROV & [name] [name] just had CSpine surg 04/08/12 by Carles Collet for C6-7 & C7-T1 spondylosis w/ radiculopathy, w/ decompressive foraminotomies &  post cervical fusion C6-T1 & pedicle screw fixation; she did satis post-op & is in a Cx collar now w/ f/u by DrHirsh soon... She is taking Robaxin Qid, Lyrica Bid, & Vicodin as needed for pain...    Pre-op clearance 6/13 from Cards> Hx CAD w/ IWMI 2007, she had PCI to RCA; Echocardiogram 12/08 showed EF 60%, mild MR, mild LAE;  Last LHC 12/10 showed dLM 20%, LAD beyond diagonal 60-70% (stable from prior catheterization in 2007), ostial circumflex 40-50%, mid RCA stent okay, EF 65%;  Given the moderate stenosis in the LAD follow up myoview was done 12/10 showing EF 76%, small inferoapical fixed defect, no ischemia;  She last saw  Dr. Excell Seltzer 1/13 & her symptoms were stable at that time, f/u 60yr.  They decided to do a pre-op MYOVIEW done 7/13 & was a neg nuclear stress study- no EKG changes, EF=69%, normal wall motion...    Lipids managed w/ Prav40 + Niasp500-2/d & FLP looks good x sl incr TG; rec better low fat diet...    She remains on Metform500Bid & Glimep1mg /d; not checking BS at home; BS=116 & A1c is low at 5.4; therefore rec decr the Glimep to 1/2 tab...    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 We reviewed prob list, meds, xrays and labs> see below>> LABS 8/13:  FLP- looks good x TG=170;  Chems- ok x BS=116, A1c=5.4;  CBC- wnl;  TSH=1.76  ~  October 30, 2012:  13mo ROV & [name] [name] still has some neck discomfort since her last surg, also c/o tired fatigue but overall "doin pretty good";  We reviewed the following medical problems during today's office visit >>     Hx bronchitis> no recent exac, w/o cough/ sput...    HBP> on Metop100Bid, Amlod5, Losar100, Lasix20; on 4 meds- BP=120/64 & she denies HA, CP, palpit, ch in SOB, edema...    CAD, PVCs> on ASA/ Plavix, & NTG prn; followed by DrCooper, seen 1/14 & stable, no changes made...    Cerebrovasc dis> on ASA, Plavix; hx sm vessel dis 7 old lacunar infarct; she denies cerebral ischemic symptoms...    Hyperlipid> on Prav40, Niasp500, FishOil, mult supplements; last FLP 8/13 showed TChol 134, TG 170, HDL 40, LDL 60; Rec- same meds better low fat diet & exerc...    DM> on Metform500bid, Glimep1mg -1/2; labs show BS=106, A1c=5.9 & Rec to stop glimep...    GI- GERD, Divertics, IBS> on Prilosec20Bid, Bentyl10Tidprn, Reguloid; stable on meds, last colon 2004 & needs f/u soon...     GU- Urinary incont> on Trimpex per Urology; followed by DrTannenbaum w/ prev pubovag sling ...    DJD, Neck  Pain, Chr pain syndrome> on ?Vicodin, Lyric, Robaxin, supplements; followed by Carles Collet, DrSethi, et al...    Chr dizzy> aware- she has been worked up by Neuro- DrSethi...    Anxiety> on Effexor37.5/d... We reviewed prob list, meds, xrays and labs> see below for updates >> she had the 2013 Flu vaccine 9/13... LABS 2/14:  Chems- wnl;  BS=106, A1c=5.9 on Metform500Bid & Glimep1mg -1/2 tab (told to stop)...    ~  June 15, 2013:  50mo ROV & [name] [name] reports a fall about 30mo ago w/ incr back pain ever since; says it's hard to turn over in bed w/ pain over scarum; offered XRays and further eval  by Ortho- at first she declined but notes pain is getting worse so I insisted we refer her to Ortho for further eval- Murphy/Wainer...     Hx bronchitis> no recent exac, w/o cough/ sput/ etc & no regular meds......    HBP> on Metop100Bid, Amlod5, Losar100, Lasix20; on 4 meds- BP=130/68 & she denies HA, CP, palpit, ch in SOB, edema...    CAD, PVCs> on ASA/ Plavix, & NTG prn; followed by DrCooper, seen 1/14 & stable, no changes made...    Cerebrovasc dis> on ASA, Plavix; hx sm vessel dis & old lacunar infarct; she denies cerebral ischemic symptoms...    Hyperlipid> on Prav40, Niasp500, FishOil, mult supplements; FLP 10/14 showed TChol 156, TG 163, HDL 46, LDL 78; Rec- same meds better low fat diet & exerc...    DM> on Metform500bid monotherapy; Labs 10/14 showed BS=105, A1c=6.5 and rec to continue same + diet & exercise...    GI- GERD, Divertics, IBS, polyp, ischemic colitis> on Prilosec20Bid, Bentyl10Tidprn, Reguloid; stable on meds, Colonoscopy 4/14 by DrPyrtle> erythematous mucosa in desc colon (bx consistent w/ ischemic colitis- she is relatively asymptomatic & it was felt to be an incidental finding); mild divertics & 5mm adenomatous polyp near splenic flex- f/u in 76yrs was suggested.    GU- Urinary incont, bladder spasms, UTIs> on Trimpex per Urology; followed by DrTannenbaum w/ prev pubovag sling; she had neg  Cysto 3/14; c/o dysuria 10/14 w/ Klebs UTI- treated w/ Cipro... Marland Kitchen..    DJD, Neck Pain, Chr pain syndrome> on ?Vicodin, Lyrica75Bid, off Robaxin, & takes mult supplements; followed by DrHirsh, DrSethi, et al; she wants change of Lyrica to Gabapentin 300Bid due to $$$    Chr dizzy> aware- she has been worked up by Neuro- DrSethi...    Anxiety> on Effexor37.5/d... We reviewed prob list, meds, xrays and labs> see below for updates >> OK 2014 Flu vaccine today... LABS 10/14:  FLP- ok on Prav40 x TG=163;  Chems- ok w/ BS=105 A1c=6.5;  CBC- wnl;  TSH=2.52;  VitD=49;  UA- pos for UTI (Klebs sens Cipro)...           PROBLEM LIST:    Hx of BRONCHITIS (ICD-466.0) - hx AB w/ refractory episode 4/11 & we added ADVAIR 100Bid, MUCINEX OTC, & Hydromet cough syrup Prn... ~  8/11:  she still c/o some wheezing & wants cough syrup refilled... CXR= clear, NAD; and PFTs show mild restriction only. ~  CXR 3/12 showed sl incr markings, borderline heart size, cervical plate, NAD.Marland Kitchen. ~  CXR 7/13 showed normal heart size, clear lungs, elev right hemidiaph, CSpine surg, NAD...  HYPERTENSION (ICD-401.9) - controlled on  METOPROLOL 100mg Bid, NORVASC 5mg /d, LOSARTAN 100mg /d, and LASIX 20mg /d...  ~  3/13:  BP= 128/80 and feeling OK- without HA, visual symptoms, CP, palpit, SOB, edema, etc... ~  8/13:  BP= 126/60 and she has some neck pain but denies CP, palpit, SOB, edema. ~  2/14: on Metop100Bid, Amlod5, Losar100, Lasix20; on 4 meds- BP=120/64 & she denies HA, CP, palpit, ch in SOB, edema ~  10/14: on Metop100Bid, Amlod5, Losar100, Lasix20; on 4 meds- BP=130/68 & she denies HA, CP, palpit, ch in SOB, edema   CORONARY ARTERY DISEASE (ICD-414.00) - on above + ASA 81mg /d + PLAVIX 75mg /d...  followed by DrCooper and his notes are reviewed. 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 she had PCI w/ drug eluting stent in RCA... ~  NuclearStressTest 12/08 showed sm area of  ischemia inferolateral  wall, norm LVF=70%... ~  2DEcho 12/08 showed no LV regional wall motion abn, EF=60%, some DD was seen... ~  saw DrCooper 11/09- note reviewed... continue same meds, consider re-cath if CP occurs. ~  recurrent CP 12/10:  Myoview w/ sm inferoapical infarct, no ischemia, EF= 76%;  12/10 cath showed 20%LMain, 60-70%midLAD, 40-50%ostial CIRC, midRCA stent patent w/o restenosis, EF= 65%... stable, no change from 2007, med Rx. ~  Pre-op cards eval 6/13 was neg, OK for surg;  MYOVIEW done 7/13 & was a neg nuclear stress study- no EKG changes, EF=69%, normal wall motion... ~  Cards f/u eval 1/14> HBP, CAD s/p IWMI 2007, s/p PCI; note reviewed- stable, no angina, wt down, continue same... EKG showed NSR, rate69, qs in III & aVF, NAD...   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  CEREBROVASCULAR DISEASE (ICD-437.9) - CT Br 2/09 showed sm vessel disease and old lacunar infarct... on ASA + PLAVIX.  MIXED HYPERLIPIDEMIA (ICD-272.2) - on PRAVASTATIN 40mg /d + NIASPAN 500- 2Qhs + FISH OIL daily... ~  FLP 8/08 on Lipitor80 showed TChol 127, TG 201, HDL 34, LDL 61 ~  FLP 7/09 showed TChol 129, TG 265, HDL 36, LDL 65... Lipid Clinic may want to consider a fibrate... ~  FLP 9/09 howed TChol 152, TG 225, HDL 30, LDL 70 ~  FLP 1/10 showed TChol 137, TG 203, HDL 38, LDL 58 ~  FLP 9/10 showed TChol 154, TG 225, HDL 42, LDL 81 ~  FLP 8/11 showed TChol 118, TG 332, HDL 32, LDL 45... may need fibrate. ~  FLP 2/12 showed TChol 134, TG 257, HDL 33, LDL 58... needs better low fat diet! ~  FLP 8/12 showed TChol 174, TG 320, HDL 41, LDL 75... Ditto ~  FLP 8/13 showed TChol 134, TG 170, HDL 40, LDL 60 ~  FLP 10/14 on Prav40+Niasp1000+mult supplements showed TChol 156, TG 163, HDL 46, LDL 78  DIABETES MELLITUS, TYPE II (ICD-250.00) - on METFORMIN 500mg Bid & GLIMEPIRIDE 1mg /d... ~  labs 8/08 showed BS= 162, HgA1c= 6.5.Marland KitchenMarland Kitchen On Metform500- 1/2 Bid. ~  labs  2/09 showed BS= 154 ~  labs 9/09 (wt=152#) showed BS= 141, HgA1c= 6.1.Marland KitchenMarland Kitchen Continue same. ~  labs 9/10 (wt=126#) showed BS= 136, A1c= 6.1.Marland KitchenMarland Kitchen Great job w/ wt loss. ~  labs 8/11 showed BS= 137, A1c= 7.1 ~  labs 2/12 showed BS= 192, A1c= 7.5.Marland KitchenMarland Kitchen incr Metform500Bid, Add Glimep1mg /d. ~  Labs 8/12 showed BS= 112, A1c= 5.8.Marland KitchenMarland Kitchen rec decr Glimep1mg  to 1/2 tab Qam. ~  3/13: review of EPIC labs shows BS 81-131 over the last 3 months... ~  Labs 8/13 showed BS= 116, A1c= 5.4.Marland KitchenMarland Kitchen rec to decr Glimep1mg  to 1/2 tab daily... ~  2/14: on Metform500bid, Glimep1mg -1/2; labs show BS=106, A1c=5.9 & Rec to stop Glimep. ~  10/14: on Metform500Bid showed BS=105, A1c=6.5.Marland KitchenMarland Kitchen Continue same.  GASTROESOPHAGEAL REFLUX DISEASE (ICD-530.81) - last EGD 4/08 showed GERD and deformed pylorus... Rx w/ PPI... ~  she had a follow up w/ DrPatterson 7/09 for her GERD- on OMEP 20mg Bid, and IBS- on fiber & Bentyl... ~  5/12:  She had ?gastroenteritis & 10 lb wt loss, treated by TP w/ Cipro, Immodium, Bentyl==> improved. ~  She remains on Prilosec20Bid...  DIVERTICULAR DISEASE (ICD-562.10) - last colonoscopy 11/04 by DrSam showed divertics only... IBS (ICD-564.1) - on BENTYL 10mg  Tid Prn, Fiber supplement & Align... ~  Colonoscopy 4/14 by DrPyrtle> erythematous mucosa in desc colon (bx consistent  w/ ischemic colitis- she is relatively asymptomatic & it was felt to be an incidental finding); mild divertics & 5mm adenomatous polyp near splenic flex- f/u in 10yrs was suggested.  URINARY INCONTINENCE (ICD-788.30) - eval 7/08 by DrTannenbaum- prev pubovag sling surg 2004... she has persist symptoms and he rec physical therapy... ~  she has had recurrent UTIs & Rx w/ Cipro... ~  6/12:  Had f/u Urology for her chr cystitis & incont, tried Gelnique, "no help" per pt & stopped. 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 ~  10/13: f/u Urology> dysuria, chr cystitis (Klebs), urge incont, atrophic vaginitis; on Estrace cream, stopped Myrtetriq due to blurred vision... ~  3/14:  F/u DrTannenbaum w/ Cysto> done for freq UTIs- negative, NAD; felt to have chr cystitis & placed on Premarin topically & Trimethoprim incr to 200mg  Qhs... ~  10/14: treated for Klebs UTI w/ Cipro...  DEGENERATIVE JOINT DISEASE (ICD-715.90)   < SEE ABOVE > ~  ER eval 12/10 for left knee pain- XRay w/ DJD, sm effusion- refer to Ortho, DrMurphy for shot (helped). ~  Office f/u 7/11 w/ another shot given- consider TKR. ~  3/12:  She had left TKR by DrMurphy, followed by rehab etc & improved... 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  NECK PAIN, CHRONIC (ICD-723.1) - s/p neck fusion surg x 2 in 2005 and 2006 by DrHirsh w/ a complex situation involving non-union, severe spondylosis, etc... notes from DrHirsh reviewed> on TRAMADOL 50mg  prn, & METHOCARBAMOL 500mg Qid. ~  saw DrHirsh 9/10 & 5/11- stable, continue conservative management... ~  2/12:  OV c/o some incr neck discomfort & HA> XRays unchanged, rec VICODIN up to 3/d as needed. ~  MRI CSpine 5/13 showed post fusion C2-5 & ant fusion from C4-T1 w/ patent canal... 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  SYNDROME, CHRONIC PAIN (ICD-338.4) - on VICODIN prn, LYRICA 75mg Bid, METHOCARBAMOL... ~  10/14: she is asking for change off Lyrica due to costs; ch to GABAPENTIN 300mg  Bid...  DIZZINESS, CHRONIC (ICD-780.4) - full eval by  DrSethi w/ CT/ MRI etc... Dx'd w/ benign positional vertigo and Rx'd w/ Eply manuever as needed.  ANXIETY (ICD-300.00) - on EFFEXOR 37.5mg /d for hot flashes & dizziness (she states this helps)... ~  Sep09:  she wants her "Factor V checked"... states that her sister & granddaughter see DrEnnever w/ clots and he thought it would be a good idea to check [name] [name] for Factor V Leiden Mutation (she has never had a venous thromboembolic phenomenon)... 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 LABS 9/09 neg for Factor V mutation.   Past Surgical History  Procedure Laterality Date  . Cervical spine surgery  07/2005    Dr Akeya East  . Bladder repair  2007  . Total abdominal hysterectomy    . Tonsillectomy    .  Thumb surgery    . Neck surgery      has had 3 surgeries  . Temporomandibular joint surgery    . Left total knee replacement  11/2010    Dr Eulah Pont  . I&d knee with poly exchange  09/25/2011    Procedure: IRRIGATION AND DEBRIDEMENT KNEE WITH POLY EXCHANGE;  Surgeon: Eulas Post, MD;  Location: [facility] OR;  Service: Orthopedics;  Laterality: Left;  . Cardiac catheterization  2007    Stents  . Appendectomy  1955  . Tonsillectomy  1950  . Cholecystectomy  2007  . Eye surgery  2011    Bilteral  . Joint replacement  2012    left  . Incision and drainage abscess / hematoma of bursa / knee / thigh  09/2011  . Posterior cervical fusion/foraminotomy  04/08/2012    Procedure: POSTERIOR CERVICAL FUSION/FORAMINOTOMY LEVEL 2;  Surgeon: Clydene Fake, MD;  Location: [facility];  Service: Neurosurgery;  Laterality: Left;  Left Cervical six-seven Foraminotomy, bilateral cervical seven-thoracic one foraminotomy, cervical six-seven, cervical seven-thoracic one fusion with posterior instrumentation    Outpatient Encounter Prescriptions as of 06/15/2013  Medication Sig Dispense Refill  . Aloe Vera 25 MG CAPS Take 1 capsule by mouth 2 (two) times daily.        Marland Kitchen amLODipine (NORVASC) 5 MG tablet Take 5 mg by mouth every morning.      Marland Kitchen amLODipine (NORVASC) 5 MG tablet TAKE ONE TABLET BY MOUTH ONCE DAILY  30 tablet  6  . Ascorbic Acid (VITAMIN C) 1000 MG tablet Take 1,000 mg by mouth daily.        Marland Kitchen aspirin EC 81 MG tablet Take 81 mg by mouth every morning.       . calcium carbonate (OS-CAL) 600 MG TABS Take 600 mg by mouth 2 (two) times daily with a meal.        . Cholecalciferol (VITAMIN D3) 1000 UNITS CAPS Take 1 capsule by mouth daily.        . Cinnamon 500 MG capsule Take 500 mg by mouth 2 (two) times daily.       . clopidogrel (PLAVIX) 75 MG tablet TAKE ONE TABLET BY MOUTH EVERY DAY  30 tablet  6  . Cod Liver Oil 1000 MG CAPS Take 1 capsule by mouth 2 (two) times daily.        . Coenzyme Q10 (CO Q 10) 100 MG CAPS Take 1 capsule by mouth daily.        Marland Kitchen dicyclomine (BENTYL) 10 MG capsule Take 10 mg by mouth 3 (three) times daily. Abdomen cramping      . dicyclomine (BENTYL) 10 MG capsule TAKE ONE CAPSULE BY MOUTH THREE TIMES DAILY AS NEEDED FOR ABDOMINAL FOR CRAMPS  90 capsule  5  . Flaxseed, Linseed, 1000 MG CAPS Take 1 capsule by mouth 2 (two) times daily.       . furosemide (LASIX) 20 MG tablet TAKE ONE TABLET BY MOUTH EVERY DAY  30 tablet  10  . Garlic 1000 MG CAPS Take 2 capsules by mouth at bedtime.        . Glucosamine-Chondroit-Vit C-Mn (GLUCOSAMINE CHONDR 1500 COMPLX PO) Take 1 capsule by mouth 2 (two) times daily.        Marland Kitchen losartan (COZAAR) 100 MG tablet TAKE ONE TABLET BY MOUTH EVERY DAY  30 tablet  6  . metFORMIN (GLUCOPHAGE) 500 MG tablet Take 500 mg by mouth 2 (two) times daily with a meal.      .  metFORMIN (GLUCOPHAGE) 500 MG tablet TAKE ONE TABLET BY MOUTH TWICE DAILY  60 tablet  5  . methocarbamol (ROBAXIN) 500 MG tablet Take 500 mg by mouth 4 (four) times daily as needed. For pain/ muscle spasms      . metoprolol (LOPRESSOR) 100 MG tablet TAKE ONE TABLET BY MOUTH TWICE DAILY  60 tablet  10  . Multiple Vitamin (STRESSTABS PO)  Take 1 tablet by mouth at bedtime.        . niacin (NIASPAN) 500 MG CR tablet Take 1,000 mg by mouth at bedtime.      . nitroGLYCERIN (NITROSTAT) 0.4 MG SL tablet Place 0.4 mg under the tongue every 5 (five) minutes as needed. For chest pain      . Omega-3 Fatty Acids (FISH OIL) 1000 MG CAPS Take 1 capsule by mouth 4 (four) times daily.        Marland Kitchen omeprazole (PRILOSEC) 20 MG capsule TAKE TWO CAPSULES BY MOUTH ONCE DAILY  60 capsule  1  . pravastatin (PRAVACHOL) 40 MG tablet Take 40 mg by mouth every evening.      . pravastatin (PRAVACHOL) 40 MG tablet TAKE ONE TABLET BY MOUTH IN THE EVENING  30 tablet  6  . pregabalin (LYRICA) 75 MG capsule Take 75 mg by mouth 2 (two) times daily.      . psyllium (REGULOID) 0.52 G capsule Take 0.52 g by mouth 2 (two) times daily.        . Red Yeast Rice 600 MG CAPS Take 1 capsule by mouth 2 (two) times daily.        Marland Kitchen trimethoprim (TRIMPEX) 100 MG tablet Take 1 tablet by mouth at bedtime.      Marland Kitchen venlafaxine (EFFEXOR) 37.5 MG tablet TAKE ONE TABLET BY MOUTH EVERY DAY  30 tablet  5   No facility-administered encounter medications on file as of 06/15/2013.    Allergies  Allergen Reactions  . Codeine     REACTION: nausea  . Niacin And Related     Must take "Flush-free"  . Prednisone     REACTION: rash  . Sulfonamide Derivatives     REACTION: rash  . Tetracycline     REACTION: rash    Current Medications, Allergies, Past Medical History, Past Surgical History, Family History, and Social History were reviewed in [employer] record.    Review of Systems    See HPI - all other systems neg except as noted...  The patient complains of dyspnea on exertion, headaches, and depression.  The patient denies anorexia, fever, weight loss, weight gain, vision loss, decreased hearing, hoarseness, chest pain, syncope, peripheral edema, prolonged cough, hemoptysis, abdominal pain, melena, hematochezia, severe indigestion/heartburn, hematuria,  incontinence, muscle weakness, suspicious skin lesions, transient blindness, difficulty walking, unusual weight change, abnormal bleeding, enlarged lymph nodes, and angioedema.     Objective:   Physical Exam    WD, WN, 72 y/o WF in NAD... GENERAL:  Alert & oriented; pleasant & cooperative... HEENT:  Nice/AT, EOM-wnl, PERRLA, EACs-clear, TMs-wnl, NOSE-clear, THROAT-clear & wnl. NECK:  In Cx collar now w/ decrROM & scars from surg; no JVD; normal carotid impulses w/o bruits;  no thyromegaly or nodules palpated; no lymphadenopathy. CHEST:  Clear to P & A; without wheezes/ rales/ or rhonchi heard... HEART:  Regular Rhythm; without murmurs/ rubs/ or gallops detected... ABDOMEN:  Soft & nontender; normal bowel sounds; no organomegaly or masses palpated... EXT: s/p LTKR, mod arthritic changes; no varicose veins/ +venous insuffic/ tr edema. She  has decr ROM left shoulder & neck, tender left pectoralis muscle & chest wall... NEURO:  CN's intact; no focal neuro deficits... DERM:  No lesions noted; no rash etc...  RADIOLOGY DATA:  Reviewed in the EPIC EMR & discussed w/ the patient...  LABORATORY DATA:  Reviewed in the EPIC EMR & discussed w/ the patient...   Assessment & Plan:    HBP>  BP stable on Rx w/ Metoprolol, Losartan, & Lasix; continue same...  CAD>  On ASA/ Plavix (soon to be restarted) & followed by DrCooper; stable w/o angina, palpit, SOB, edema...  Cerebrovasc dis>  Stable on meds and denies cerebral ischemic symptoms...  HYPERLIPIDEMIA>  TGs still elev & needs better low fat diet; continue same meds for now...  DM>  Improved control & w/ A1c at 6.5 & we will continue Metform monotherapy...  GI> GERD, Divertics, IBS>  Stable on Omep & Bentyl...  GU> UTIs, chr cystitis, incont>  Followed & managed by DrTannenbaum...  DJD> s/p left TKR> Infected left TKR prosthesis>  managed by DrMurphy et al & DrCampbell for ID...  [name]>  S/p surgeries w/ fixation, plating, non-union,  etc;  followed & managed by DrHirsh ==> repeat surg 8/13 as above...  Anxiety>  On Effexor which helps her hot flashes & dizziness she says...   Patient's Medications  New Prescriptions   CIPROFLOXACIN (CIPRO) 250 MG TABLET    Take 1 tablet (250 mg total) by mouth 2 (two) times daily.   GABAPENTIN (NEURONTIN) 300 MG CAPSULE    Take 1 capsule (300 mg total) by mouth 2 (two) times daily.   TRAMADOL (ULTRAM) 50 MG TABLET    Take 1 tablet (50 mg total) by mouth 3 (three) times daily as needed for pain.  Previous Medications   ASCORBIC ACID (VITAMIN C) 1000 MG TABLET    Take 1,000 mg by mouth daily.     ASPIRIN EC 81 MG TABLET    Take 81 mg by mouth every morning.    CALCIUM CARBONATE (OS-CAL) 600 MG TABS    Take 600 mg by mouth 2 (two) times daily with a meal.     CHOLECALCIFEROL (VITAMIN D3) 1000 UNITS CAPS    Take 1 capsule by mouth daily.     CINNAMON 500 MG CAPSULE    Take 500 mg by mouth 2 (two) times daily.    COD LIVER OIL 1000 MG CAPS    Take 1 capsule by mouth 2 (two) times daily.     COENZYME Q10 (CO Q 10) 100 MG CAPS    Take 1 capsule by mouth daily.     FLAXSEED, LINSEED, 1000 MG CAPS    Take 1 capsule by mouth 2 (two) times daily.    GLUCOSAMINE-CHONDROIT-VIT C-MN (GLUCOSAMINE CHONDR 1500 COMPLX PO)    Take 1 capsule by mouth 2 (two) times daily.     METFORMIN (GLUCOPHAGE) 500 MG TABLET    TAKE ONE TABLET BY MOUTH TWICE DAILY   MULTIPLE VITAMIN (STRESSTABS PO)    Take 1 tablet by mouth at bedtime.     NIACIN (NIASPAN) 500 MG CR TABLET    Take 1,000 mg by mouth at bedtime.   NITROGLYCERIN (NITROSTAT) 0.4 MG SL TABLET    Place 0.4 mg under the tongue every 5 (five) minutes as needed. For chest pain   OMEGA-3 FATTY ACIDS (FISH OIL) 1000 MG CAPS    Take 1 capsule by mouth 4 (four) times daily.     PSYLLIUM (REGULOID) 0.52 G  CAPSULE    Take 0.52 g by mouth 2 (two) times daily.     RED YEAST RICE 600 MG CAPS    Take 1 capsule by mouth 2 (two) times daily.     TRIMETHOPRIM (TRIMPEX) 100  MG TABLET    Take 2 tablets by mouth at bedtime.   Modified Medications   Modified Medication Previous Medication   AMLODIPINE (NORVASC) 5 MG TABLET amLODipine (NORVASC) 5 MG tablet      TAKE ONE TABLET BY MOUTH ONCE DAILY    TAKE ONE TABLET BY MOUTH ONCE DAILY   CLOPIDOGREL (PLAVIX) 75 MG TABLET clopidogrel (PLAVIX) 75 MG tablet      TAKE ONE TABLET BY MOUTH ONCE DAILY    TAKE ONE TABLET BY MOUTH ONCE DAILY   DICYCLOMINE (BENTYL) 10 MG CAPSULE dicyclomine (BENTYL) 10 MG capsule      TAKE ONE CAPSULE BY MOUTH THREE TIMES DAILY AS NEEDED FOR ABDOMINAL FOR CRAMPS    TAKE ONE CAPSULE BY MOUTH THREE TIMES DAILY AS NEEDED FOR ABDOMINAL FOR CRAMPS   FUROSEMIDE (LASIX) 20 MG TABLET furosemide (LASIX) 20 MG tablet      TAKE ONE TABLET BY MOUTH EVERY DAY    TAKE ONE TABLET BY MOUTH EVERY DAY   LOSARTAN (COZAAR) 100 MG TABLET losartan (COZAAR) 100 MG tablet      TAKE ONE TABLET BY MOUTH ONCE DAILY    TAKE ONE TABLET BY MOUTH EVERY DAY   METOPROLOL (LOPRESSOR) 100 MG TABLET metoprolol (LOPRESSOR) 100 MG tablet      TAKE ONE TABLET BY MOUTH TWICE DAILY    TAKE ONE TABLET BY MOUTH TWICE DAILY   OMEPRAZOLE (PRILOSEC) 20 MG CAPSULE omeprazole (PRILOSEC) 20 MG capsule      TAKE TWO CAPSULES BY MOUTH ONCE DAILY    TAKE TWO CAPSULES BY MOUTH ONCE DAILY   PRAVASTATIN (PRAVACHOL) 40 MG TABLET pravastatin (PRAVACHOL) 40 MG tablet      TAKE ONE TABLET BY MOUTH IN THE EVENING    TAKE ONE TABLET BY MOUTH IN THE EVENING   VENLAFAXINE (EFFEXOR) 37.5 MG TABLET venlafaxine (EFFEXOR) 37.5 MG tablet      TAKE ONE TABLET BY MOUTH ONCE DAILY    TAKE ONE TABLET BY MOUTH EVERY DAY  Discontinued Medications   ALOE VERA 25 MG CAPS    Take 1 capsule by mouth 2 (two) times daily.    AMLODIPINE (NORVASC) 5 MG TABLET    Take 5 mg by mouth every morning.   CLOPIDOGREL (PLAVIX) 75 MG TABLET    TAKE ONE TABLET BY MOUTH EVERY DAY   DICYCLOMINE (BENTYL) 10 MG CAPSULE    Take 10 mg by mouth 3 (three) times daily. Abdomen cramping    GARLIC 1000 MG CAPS    Take 2 capsules by mouth at bedtime.     METFORMIN (GLUCOPHAGE) 500 MG TABLET    Take 500 mg by mouth 2 (two) times daily with a meal.   METHOCARBAMOL (ROBAXIN) 500 MG TABLET    Take 500 mg by mouth 4 (four) times daily as needed. For pain/ muscle spasms   METOPROLOL (LOPRESSOR) 100 MG TABLET    TAKE ONE TABLET BY MOUTH TWICE DAILY   PRAVASTATIN (PRAVACHOL) 40 MG TABLET    Take 40 mg by mouth every evening.   PREGABALIN (LYRICA) 75 MG CAPSULE    Take 75 mg by mouth 2 (two) times daily.

## 2013-06-17 DIAGNOSIS — M5137 Other intervertebral disc degeneration, lumbosacral region: Secondary | ICD-10-CM | POA: Diagnosis not present

## 2013-06-17 DIAGNOSIS — M545 Low back pain, unspecified: Secondary | ICD-10-CM | POA: Diagnosis not present

## 2013-06-17 DIAGNOSIS — M25559 Pain in unspecified hip: Secondary | ICD-10-CM | POA: Diagnosis not present

## 2013-06-17 LAB — URINE CULTURE

## 2013-06-22 ENCOUNTER — Other Ambulatory Visit: Payer: Self-pay | Admitting: Pulmonary Disease

## 2013-06-22 MED ORDER — CIPROFLOXACIN HCL 250 MG PO TABS: 250.0000 mg | ORAL_TABLET | Freq: Two times a day (BID) | ORAL | Status: DC

## 2013-07-09 DIAGNOSIS — Z01419 Encounter for gynecological examination (general) (routine) without abnormal findings: Secondary | ICD-10-CM | POA: Diagnosis not present

## 2013-07-09 DIAGNOSIS — N959 Unspecified menopausal and perimenopausal disorder: Secondary | ICD-10-CM | POA: Diagnosis not present

## 2013-07-09 DIAGNOSIS — Z1231 Encounter for screening mammogram for malignant neoplasm of breast: Secondary | ICD-10-CM | POA: Diagnosis not present

## 2013-07-15 DIAGNOSIS — N312 Flaccid neuropathic bladder, not elsewhere classified: Secondary | ICD-10-CM | POA: Diagnosis not present

## 2013-07-15 DIAGNOSIS — N952 Postmenopausal atrophic vaginitis: Secondary | ICD-10-CM | POA: Diagnosis not present

## 2013-07-15 DIAGNOSIS — N3946 Mixed incontinence: Secondary | ICD-10-CM | POA: Diagnosis not present

## 2013-07-15 DIAGNOSIS — N3020 Other chronic cystitis without hematuria: Secondary | ICD-10-CM | POA: Diagnosis not present

## 2013-08-14 ENCOUNTER — Other Ambulatory Visit: Payer: Self-pay | Admitting: Pulmonary Disease

## 2013-08-22 ENCOUNTER — Other Ambulatory Visit: Payer: Self-pay | Admitting: Cardiovascular Disease

## 2013-09-09 ENCOUNTER — Other Ambulatory Visit: Payer: Self-pay | Admitting: Pulmonary Disease

## 2013-09-14 ENCOUNTER — Other Ambulatory Visit: Payer: Self-pay | Admitting: Cardiovascular Disease

## 2013-09-17 ENCOUNTER — Ambulatory Visit: Payer: Medicare Other | Admitting: Cardiovascular Disease

## 2013-10-06 ENCOUNTER — Other Ambulatory Visit: Payer: Self-pay | Admitting: Cardiovascular Disease

## 2013-10-06 ENCOUNTER — Other Ambulatory Visit: Payer: Self-pay | Admitting: Pulmonary Disease

## 2013-10-19 ENCOUNTER — Ambulatory Visit: Payer: Medicare Other | Admitting: Pulmonary Disease

## 2013-10-19 DIAGNOSIS — N3020 Other chronic cystitis without hematuria: Secondary | ICD-10-CM | POA: Diagnosis not present

## 2013-11-06 ENCOUNTER — Encounter: Payer: Self-pay | Admitting: Cardiovascular Disease

## 2013-11-06 ENCOUNTER — Ambulatory Visit (INDEPENDENT_AMBULATORY_CARE_PROVIDER_SITE_OTHER): Payer: Medicare Other | Admitting: Cardiovascular Disease

## 2013-11-06 VITALS — BP 138/75 | HR 63 | Ht 60.0 in | Wt 134.1 lb

## 2013-11-06 DIAGNOSIS — I2510 Atherosclerotic heart disease of native coronary artery without angina pectoris: Secondary | ICD-10-CM | POA: Diagnosis not present

## 2013-11-06 DIAGNOSIS — I4949 Other premature depolarization: Secondary | ICD-10-CM

## 2013-11-06 DIAGNOSIS — I679 Cerebrovascular disease, unspecified: Secondary | ICD-10-CM | POA: Diagnosis not present

## 2013-11-06 NOTE — Patient Instructions (Signed)
Your physician recommends that you continue on your current medications as directed. Please refer to the Current Medication list given to you today.  Your physician wants you to follow-up in: 1 year with Dr. Cooper.  You will receive a reminder letter in the mail two months in advance. If you don't receive a letter, please call our office to schedule the follow-up appointment.   

## 2013-11-06 NOTE — Progress Notes (Signed)
HPI:  73 year old woman presenting for followup evaluation. The patient has a history of coronary artery disease with initial presentation in 2007 when she had an inferior wall STEMI. She was treated with primary PCI using a drug-eluting stent in the right coronary artery. LV function has been within normal limits. She underwent repeat heart catheterization in 2010 which showed moderate LAD stenosis stable from her previous study, patency of her RCA stent site, and a left ventricular ejection fraction of 65%. A followup Myoview scan in 2013 was done and showed no ischemia.  From a symptomatic perspective the patient is doing very well. [name] lost about 20 pounds through diet and exercise. She rides a stationary bike. She denies any exertional symptoms. She does have occasional shooting pains in the left chest that are brief in nature. These have not been related to exertion and she has not required any nitroglycerin. She denies dyspnea, orthopnea, PND, or leg swelling.  Lipid Panel     Component Value Date/Time   CHOL 156 06/15/2013 1522   TRIG 163.0* 06/15/2013 1522   HDL 45.70 06/15/2013 1522   CHOLHDL 3 06/15/2013 1522   VLDL 32.6 06/15/2013 1522   LDLCALC 78 06/15/2013 1522      Outpatient Encounter Prescriptions as of 11/06/2013  Medication Sig  . amLODipine (NORVASC) 5 MG tablet TAKE ONE TABLET BY MOUTH ONCE DAILY  . Ascorbic Acid (VITAMIN C) 1000 MG tablet Take 1,000 mg by mouth daily.    Marland Kitchen aspirin EC 81 MG tablet Take 81 mg by mouth every morning.   . calcium carbonate (OS-CAL) 600 MG TABS Take 600 mg by mouth 2 (two) times daily with a meal.    . Cholecalciferol (VITAMIN D3) 1000 UNITS CAPS Take 1 capsule by mouth daily.    . Cinnamon 500 MG capsule Take 500 mg by mouth 2 (two) times daily.   . clopidogrel (PLAVIX) 75 MG tablet TAKE ONE TABLET BY MOUTH ONCE DAILY  . Cod Liver Oil 1000 MG CAPS Take 1 capsule by mouth 2 (two) times daily.    . Coenzyme Q10 (CO Q 10) 100 MG CAPS  Take 1 capsule by mouth daily.    Marland Kitchen dicyclomine (BENTYL) 10 MG capsule TAKE ONE CAPSULE BY MOUTH THREE TIMES DAILY AS NEEDED FOR ABDOMINAL FOR CRAMPS  . Flaxseed, Linseed, 1000 MG CAPS Take 1 capsule by mouth 2 (two) times daily.   . furosemide (LASIX) 20 MG tablet TAKE ONE TABLET BY MOUTH EVERY DAY  . gabapentin (NEURONTIN) 300 MG capsule Take 1 capsule (300 mg total) by mouth 2 (two) times daily.  . Glucosamine-Chondroit-Vit C-Mn (GLUCOSAMINE CHONDR 1500 COMPLX PO) Take 1 capsule by mouth 2 (two) times daily.    Marland Kitchen losartan (COZAAR) 100 MG tablet TAKE ONE TABLET BY MOUTH ONCE DAILY  . metFORMIN (GLUCOPHAGE) 500 MG tablet TAKE ONE TABLET BY MOUTH TWICE DAILY  . metoprolol (LOPRESSOR) 100 MG tablet TAKE ONE TABLET BY MOUTH TWICE DAILY  . Multiple Vitamin (STRESSTABS PO) Take 1 tablet by mouth at bedtime.    . niacin (NIASPAN) 500 MG CR tablet Take 1,000 mg by mouth at bedtime.  . nitroGLYCERIN (NITROSTAT) 0.4 MG SL tablet Place 0.4 mg under the tongue every 5 (five) minutes as needed. For chest pain  . Omega-3 Fatty Acids (FISH OIL) 1000 MG CAPS Take 1 capsule by mouth 4 (four) times daily.    Marland Kitchen omeprazole (PRILOSEC) 20 MG capsule TAKE TWO CAPSULES BY MOUTH ONCE DAILY  . pravastatin (PRAVACHOL)  40 MG tablet TAKE ONE TABLET BY MOUTH IN THE EVENING  . psyllium (REGULOID) 0.52 G capsule Take 0.52 g by mouth 2 (two) times daily.    . Red Yeast Rice 600 MG CAPS Take 1 capsule by mouth 2 (two) times daily.    . traMADol (ULTRAM) 50 MG tablet Take 1 tablet (50 mg total) by mouth 3 (three) times daily as needed for pain.  Marland Kitchen trimethoprim (TRIMPEX) 100 MG tablet Take 2 tablets by mouth at bedtime.   Marland Kitchen venlafaxine (EFFEXOR) 37.5 MG tablet TAKE ONE TABLET BY MOUTH ONCE DAILY  . [DISCONTINUED] ciprofloxacin (CIPRO) 250 MG tablet Take 1 tablet (250 mg total) by mouth 2 (two) times daily.    Allergies  Allergen Reactions  . Codeine     REACTION: nausea  . Niacin And Related     Must take "Flush-free"  .  Prednisone     REACTION: rash  . Sulfonamide Derivatives     REACTION: rash  . Tetracycline     REACTION: rash    Past Medical History  Diagnosis Date  . HTN (hypertension)   . CAD (coronary artery disease)     1.  inf MI 2007 - tx with DES to RCA;  2.  Echocardiogram 08/2007: EF 60%, mild MR, mild LAE.  3.  Last LHC 08/2009: dLM 20%, LAD beyond diagonal 60-70%, stable from prior catheterization in 2007, ostial circumflex 40-50%, mid RCA stent okay, EF 65%. ;  4.  myoview was recommended 08/2009: EF 76%, small inferoapical fixed defect, no ischemia  5.  Myoview 7/13: no scar or ischemia, EF 69%  . Premature ventricular contractions   . Cerebrovascular disease, unspecified   . Mixed hyperlipidemia   . DM type 2 (diabetes mellitus, type 2)   . GERD (gastroesophageal reflux disease)   . Diverticular disease   . IBS (irritable bowel syndrome)   . Urinary incontinence   . DJD (degenerative joint disease)   . Chronic neck pain   . Chronic pain syndrome   . Dizziness   . Anxiety   . 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 H/O hiatal hernia   . Nocturia   . Chronic kidney disease     frequent Kidney Infections  . Pancreatitis     1955 an once more  . PONV (postoperative nausea and vomiting)     Difficluty opening mouth wide and turning head. (Cervical Fusion)  . Mitral valve prolapse     ROS: Negative except as per HPI  BP 138/75  Pulse 63  Ht 5' (1.524 m)  Wt 134 lb 1.9 oz (60.836 kg)  BMI 26.19 kg/m2  PHYSICAL EXAM: Pt is alert and oriented, NAD HEENT: normal Neck: JVP - normal, carotids 2+= without bruits Lungs: CTA bilaterally CV: RRR without murmur or gallop Abd: soft, NT, Positive BS, no hepatomegaly Ext: no C/C/E, distal pulses intact and equal Skin: warm/dry no rash  EKG:  Sinus rhythm 63 beats per minute, within normal limits.  ASSESSMENT AND PLAN: 1. Coronary artery disease, native vessel. The patient is stable without symptoms of angina. She  is maintained on long-term dual antiplatelet therapy with her history of acute myocardial infarction and first generation drug-eluting stent. She seems to be tolerating this well without bleeding problems. We'll continue her same management and see her back in one year.  2. Hypertension. Blood pressure is well controlled on metoprolol and losartan.  3. Hyperlipidemia. The patient takes niacin and pravastatin. Lipids are followed by  Dr. Lenna Gilford and were reviewed as outlined above.   I will see her back in one year.  Sherren Mocha 11/06/2013 9:10 AM

## 2013-11-11 ENCOUNTER — Other Ambulatory Visit: Payer: Self-pay | Admitting: Cardiovascular Disease

## 2013-11-17 ENCOUNTER — Telehealth: Payer: Self-pay | Admitting: Pulmonary Disease

## 2013-11-17 NOTE — Telephone Encounter (Signed)
Pt aware that new PCP appt is 12-30-13 at 1pm.

## 2013-12-16 ENCOUNTER — Other Ambulatory Visit: Payer: Self-pay | Admitting: Pulmonary Disease

## 2013-12-16 ENCOUNTER — Ambulatory Visit: Payer: Medicare Other | Admitting: Pulmonary Disease

## 2013-12-30 ENCOUNTER — Ambulatory Visit: Payer: Medicare Other | Admitting: Physician Assistant

## 2014-01-14 ENCOUNTER — Other Ambulatory Visit: Payer: Self-pay | Admitting: Pulmonary Disease

## 2014-01-14 LAB — HM MAMMOGRAPHY: HM MAMMO: NORMAL

## 2014-01-16 ENCOUNTER — Other Ambulatory Visit: Payer: Self-pay | Admitting: Pulmonary Disease

## 2014-01-19 DIAGNOSIS — N3020 Other chronic cystitis without hematuria: Secondary | ICD-10-CM | POA: Diagnosis not present

## 2014-01-19 DIAGNOSIS — Q519 Congenital malformation of uterus and cervix, unspecified: Secondary | ICD-10-CM | POA: Diagnosis not present

## 2014-01-19 DIAGNOSIS — R300 Dysuria: Secondary | ICD-10-CM | POA: Diagnosis not present

## 2014-01-19 DIAGNOSIS — N952 Postmenopausal atrophic vaginitis: Secondary | ICD-10-CM | POA: Diagnosis not present

## 2014-01-20 ENCOUNTER — Ambulatory Visit (INDEPENDENT_AMBULATORY_CARE_PROVIDER_SITE_OTHER): Payer: Medicare Other | Admitting: Internal Medicine

## 2014-01-20 ENCOUNTER — Encounter: Payer: Self-pay | Admitting: Internal Medicine

## 2014-01-20 ENCOUNTER — Ambulatory Visit (INDEPENDENT_AMBULATORY_CARE_PROVIDER_SITE_OTHER)
Admission: RE | Admit: 2014-01-20 | Discharge: 2014-01-20 | Disposition: A | Discharge status: Home / Self Care | Payer: Medicare Other | Source: Ambulatory Visit | Attending: Internal Medicine | Admitting: Internal Medicine

## 2014-01-20 ENCOUNTER — Other Ambulatory Visit (INDEPENDENT_AMBULATORY_CARE_PROVIDER_SITE_OTHER): Payer: Medicare Other

## 2014-01-20 VITALS — BP 132/78 | HR 80 | Temp 98.3°F | Resp 16 | Ht 61.0 in | Wt 132.0 lb

## 2014-01-20 DIAGNOSIS — F418 Other specified anxiety disorders: Secondary | ICD-10-CM

## 2014-01-20 DIAGNOSIS — R071 Chest pain on breathing: Secondary | ICD-10-CM | POA: Diagnosis not present

## 2014-01-20 DIAGNOSIS — Z23 Encounter for immunization: Secondary | ICD-10-CM

## 2014-01-20 DIAGNOSIS — IMO0001 Reserved for inherently not codable concepts without codable children: Secondary | ICD-10-CM | POA: Diagnosis not present

## 2014-01-20 DIAGNOSIS — R079 Chest pain, unspecified: Secondary | ICD-10-CM | POA: Diagnosis not present

## 2014-01-20 DIAGNOSIS — I2510 Atherosclerotic heart disease of native coronary artery without angina pectoris: Secondary | ICD-10-CM

## 2014-01-20 DIAGNOSIS — R0789 Other chest pain: Secondary | ICD-10-CM | POA: Insufficient documentation

## 2014-01-20 DIAGNOSIS — E1165 Type 2 diabetes mellitus with hyperglycemia: Secondary | ICD-10-CM

## 2014-01-20 DIAGNOSIS — E782 Mixed hyperlipidemia: Secondary | ICD-10-CM

## 2014-01-20 DIAGNOSIS — I10 Essential (primary) hypertension: Secondary | ICD-10-CM

## 2014-01-20 DIAGNOSIS — K219 Gastro-esophageal reflux disease without esophagitis: Secondary | ICD-10-CM

## 2014-01-20 DIAGNOSIS — S298XXA Other specified injuries of thorax, initial encounter: Secondary | ICD-10-CM | POA: Diagnosis not present

## 2014-01-20 DIAGNOSIS — M1990 Unspecified osteoarthritis, unspecified site: Secondary | ICD-10-CM

## 2014-01-20 DIAGNOSIS — K589 Irritable bowel syndrome without diarrhea: Secondary | ICD-10-CM

## 2014-01-20 DIAGNOSIS — Z1231 Encounter for screening mammogram for malignant neoplasm of breast: Secondary | ICD-10-CM | POA: Insufficient documentation

## 2014-01-20 DIAGNOSIS — Z0000 Encounter for general adult medical examination without abnormal findings: Secondary | ICD-10-CM | POA: Insufficient documentation

## 2014-01-20 LAB — HEMOGLOBIN A1C: Hgb A1c MFr Bld: 6.6 % — ABNORMAL HIGH (ref 4.6–6.5)

## 2014-01-20 LAB — CBC WITH DIFFERENTIAL/PLATELET
BASOS PCT: 0.5 % (ref 0.0–3.0)
Basophils Absolute: 0.1 10*3/uL (ref 0.0–0.1)
Eosinophils Absolute: 0.2 10*3/uL (ref 0.0–0.7)
Eosinophils Relative: 1.4 % (ref 0.0–5.0)
HEMATOCRIT: 41.7 % (ref 36.0–46.0)
HEMOGLOBIN: 14.1 g/dL (ref 12.0–15.0)
LYMPHS ABS: 3.2 10*3/uL (ref 0.7–4.0)
Lymphocytes Relative: 27.3 % (ref 12.0–46.0)
MCHC: 33.8 g/dL (ref 30.0–36.0)
MCV: 94.3 fl (ref 78.0–100.0)
MONO ABS: 0.8 10*3/uL (ref 0.1–1.0)
MONOS PCT: 7.2 % (ref 3.0–12.0)
NEUTROS ABS: 7.4 10*3/uL (ref 1.4–7.7)
Neutrophils Relative %: 63.6 % (ref 43.0–77.0)
PLATELETS: 333 10*3/uL (ref 150.0–400.0)
RBC: 4.42 Mil/uL (ref 3.87–5.11)
RDW: 13.8 % (ref 11.5–15.5)
WBC: 11.6 10*3/uL — AB (ref 4.0–10.5)

## 2014-01-20 LAB — BASIC METABOLIC PANEL
BUN: 17 mg/dL (ref 6–23)
CHLORIDE: 96 meq/L (ref 96–112)
CO2: 33 mEq/L — ABNORMAL HIGH (ref 19–32)
Calcium: 9.2 mg/dL (ref 8.4–10.5)
Creatinine, Ser: 0.8 mg/dL (ref 0.4–1.2)
GFR: 75.57 mL/min (ref >60.00)
Glucose, Bld: 116 mg/dL — ABNORMAL HIGH (ref 70–99)
POTASSIUM: 4.7 meq/L (ref 3.5–5.1)
Sodium: 136 mEq/L (ref 135–145)

## 2014-01-20 LAB — LIPID PANEL
Cholesterol: 160 mg/dL (ref 0–200)
HDL: 47.1 mg/dL (ref >39.00)
LDL Cholesterol: 85 mg/dL (ref 0–99)
Total CHOL/HDL Ratio: 3
Triglycerides: 138 mg/dL (ref 0.0–149.0)
VLDL: 27.6 mg/dL (ref 0.0–40.0)

## 2014-01-20 LAB — TSH: TSH: 2.11 u[IU]/mL (ref 0.35–4.50)

## 2014-01-20 NOTE — Progress Notes (Signed)
Subjective:    Patient ID: [name] [name], female    DOB: [birth year], 73 y.o.   MRN: [id number]  Injury Incident onset: She fell 2 weeks ago and hit her right posterior chest wall around the shoulder blade and has persistent pain in the area. The incident occurred at home. The injury mechanism was a fall. Torso injury location: right shoulder blade area. The pain is mild. Associated symptoms include chest pain. Pertinent negatives include no abdominal pain, abnormal behavior, coughing, difficulty breathing, headaches, hearing loss, inability to bear weight, light-headedness, loss of consciousness, memory loss, nausea, neck pain, numbness, seizures, tingling, visual disturbance, vomiting or weakness. There have been no prior injuries to these areas. Her tetanus status is out of date.      Review of Systems  Constitutional: Negative.  Negative for fever, chills, diaphoresis, appetite change and fatigue.  HENT: Negative.  Negative for hearing loss.   Eyes: Negative.  Negative for visual disturbance.  Respiratory: Negative.  Negative for apnea, cough, choking, chest tightness, shortness of breath, wheezing and stridor.   Cardiovascular: Positive for chest pain. Negative for palpitations and leg swelling.  Gastrointestinal: Negative.  Negative for nausea, vomiting, abdominal pain, diarrhea, constipation and blood in stool.  Endocrine: Negative.  Negative for polydipsia, polyphagia and polyuria.  Genitourinary: Negative.   Musculoskeletal: Negative.  Negative for arthralgias, back pain, myalgias, neck pain and neck stiffness.  Skin: Negative.   Allergic/Immunologic: Negative.   Neurological: Negative.  Negative for dizziness, tingling, seizures, loss of consciousness, weakness, light-headedness, numbness and headaches.  Hematological: Negative.  Negative for adenopathy. Does not bruise/bleed easily.  Psychiatric/Behavioral: Negative.  Negative for memory loss.       Objective:   Physical  Exam  Vitals reviewed. Constitutional: She is oriented to person, place, and time. She appears well-developed and well-nourished. No distress.  HENT:  Head: Normocephalic and atraumatic.  Mouth/Throat: Oropharynx is clear and moist. No oropharyngeal exudate.  Eyes: Conjunctivae are normal. Right eye exhibits no discharge. Left eye exhibits no discharge. No scleral icterus.  Neck: Normal range of motion. Neck supple. No JVD present. No tracheal deviation present. No thyromegaly present.  Cardiovascular: Normal rate, regular rhythm, normal heart sounds and intact distal pulses.  Exam reveals no gallop and no friction rub.   No murmur heard. Pulmonary/Chest: Effort normal and breath sounds normal. No accessory muscle usage or stridor. Not tachypneic. No respiratory distress. She has no decreased breath sounds. She has no wheezes. She has no rhonchi. She has no rales. Chest wall is not dull to percussion. She exhibits no mass, no tenderness, no bony tenderness, no laceration, no crepitus, no edema, no deformity, no swelling and no retraction.  Abdominal: Soft. Bowel sounds are normal. She exhibits no distension and no mass. There is no tenderness. There is no rebound and no guarding.  Musculoskeletal: Normal range of motion. She exhibits no edema and no tenderness.       Cervical back: Normal.       Thoracic back: Normal. She exhibits normal range of motion, no tenderness, no bony tenderness, no swelling, no edema, no deformity, no laceration, no pain, no spasm and normal pulse.  Lymphadenopathy:    She has no cervical adenopathy.  Neurological: She is oriented to person, place, and time.  Skin: Skin is warm and dry. No rash noted. She is not diaphoretic. No erythema. No pallor.  Psychiatric: She has a normal mood and affect. Her behavior is normal. Judgment and thought content normal.  Lab Results  Component Value Date   WBC 9.2 06/15/2013   HGB 13.8 06/15/2013   HCT 40.5 06/15/2013   PLT  302.0 06/15/2013   GLUCOSE 105* 06/15/2013   CHOL 156 06/15/2013   TRIG 163.0* 06/15/2013   HDL 45.70 06/15/2013   LDLDIRECT 74.6 04/10/2011   LDLCALC 78 06/15/2013   ALT 20 06/15/2013   AST 22 06/15/2013   NA 141 06/15/2013   K 4.4 06/15/2013   CL 100 06/15/2013   CREATININE 0.8 06/15/2013   BUN 17 06/15/2013   CO2 32 06/15/2013   TSH 2.52 06/15/2013   INR 1.54* 09/28/2011   HGBA1C 6.5 06/15/2013       Assessment & Plan:

## 2014-01-20 NOTE — Progress Notes (Signed)
Pre visit review using our clinic review tool, if applicable. No additional management support is needed unless otherwise documented below in the visit note. 

## 2014-01-20 NOTE — Patient Instructions (Signed)
Chest Wall Pain Chest wall pain is pain in or around the bones and muscles of your chest. It may take up to 6 weeks to get better. It may take longer if you must stay physically active in your work and activities.  CAUSES  Chest wall pain may happen on its own. However, it may be caused by:  A viral illness like the flu.  Injury.  Coughing.  Exercise.  Arthritis.  Fibromyalgia.  Shingles. HOME CARE INSTRUCTIONS   Avoid overtiring physical activity. Try not to strain or perform activities that cause pain. This includes any activities using your chest or your abdominal and side muscles, especially if heavy weights are used.  Put ice on the sore area.  Put ice in a plastic bag.  Place a towel between your skin and the bag.  Leave the ice on for 15-20 minutes per hour while awake for the first 2 days.  Only take over-the-counter or prescription medicines for pain, discomfort, or fever as directed by your caregiver. SEEK IMMEDIATE MEDICAL CARE IF:   Your pain increases, or you are very uncomfortable.  You have a fever.  Your chest pain becomes worse.  You have new, unexplained symptoms.  You have nausea or vomiting.  You feel sweaty or lightheaded.  You have a cough with phlegm (sputum), or you cough up blood. MAKE SURE YOU:   Understand these instructions.  Will watch your condition.  Will get help right away if you are not doing well or get worse. Document Released: 08/20/2005 Document Revised: 11/12/2011 Document Reviewed: 04/16/2011 [facility] Patient Information 2014 [place], [place]. Preventive Care for Adults, Female A healthy lifestyle and preventive care can promote health and wellness. Preventive health guidelines for women include the following key practices.  A routine yearly physical is a good way to check with your health care provider about your health and preventive screening. It is a chance to share any concerns and updates on your health and to  receive a thorough exam.  Visit your dentist for a routine exam and preventive care every 6 months. Brush your teeth twice a day and floss once a day. Good oral hygiene prevents tooth decay and gum disease.  The frequency of eye exams is based on your age, health, family medical history, use of contact lenses, and other factors. Follow your health care provider's recommendations for frequency of eye exams.  Eat a healthy diet. Foods like vegetables, fruits, whole grains, low-fat dairy products, and lean protein foods contain the nutrients you need without too many calories. Decrease your intake of foods high in solid fats, added sugars, and salt. Eat the right amount of calories for you.Get information about a proper diet from your health care provider, if necessary.  Regular physical exercise is one of the most important things you can do for your health. Most adults should get at least 150 minutes of moderate-intensity exercise (any activity that increases your heart rate and causes you to sweat) each week. In addition, most adults need muscle-strengthening exercises on 2 or more days a week.  Maintain a healthy weight. The body mass index (BMI) is a screening tool to identify possible weight problems. It provides an estimate of body fat based on height and weight. Your health care provider can find your BMI, and can help you achieve or maintain a healthy weight.For adults 20 years and older:  A BMI below 18.5 is considered underweight.  A BMI of 18.5 to 24.9 is normal.  A BMI  of 25 to 29.9 is considered overweight.  A BMI of 30 and above is considered obese.  Maintain normal blood lipids and cholesterol levels by exercising and minimizing your intake of saturated fat. Eat a balanced diet with plenty of fruit and vegetables. Blood tests for lipids and cholesterol should begin at age 52 and be repeated every 5 years. If your lipid or cholesterol levels are high, you are over 50, or you are at  high risk for heart disease, you may need your cholesterol levels checked more frequently.Ongoing high lipid and cholesterol levels should be treated with medicines if diet and exercise are not working.  If you smoke, find out from your health care provider how to quit. If you do not use tobacco, do not start.  Lung cancer screening is recommended for adults aged 67 80 years who are at high risk for developing lung cancer because of a history of smoking. A yearly low-dose CT scan of the lungs is recommended for people who have at least a 30-pack-year history of smoking and are a current smoker or have quit within the past 15 years. A pack year of smoking is smoking an average of 1 pack of cigarettes a day for 1 year (for example: 1 pack a day for 30 years or 2 packs a day for 15 years). Yearly screening should continue until the smoker has stopped smoking for at least 15 years. Yearly screening should be stopped for people who develop a health problem that would prevent them from having lung cancer treatment.  If you are pregnant, do not drink alcohol. If you are breastfeeding, be very cautious about drinking alcohol. If you are not pregnant and choose to drink alcohol, do not have more than 1 drink per day. One drink is considered to be 12 ounces (355 mL) of beer, 5 ounces (148 mL) of wine, or 1.5 ounces (44 mL) of liquor.  Avoid use of street drugs. Do not share needles with anyone. Ask for help if you need support or instructions about stopping the use of drugs.  High blood pressure causes heart disease and increases the risk of stroke. Your blood pressure should be checked at least every 1 to 2 years. Ongoing high blood pressure should be treated with medicines if weight loss and exercise do not work.  If you are 43 74 years old, ask your health care provider if you should take aspirin to prevent strokes.  Diabetes screening involves taking a blood sample to check your fasting blood sugar level.  This should be done once every 3 years, after age 84, if you are within normal weight and without risk factors for diabetes. Testing should be considered at a younger age or be carried out more frequently if you are overweight and have at least 1 risk factor for diabetes.  Breast cancer screening is essential preventive care for women. You should practice "breast self-awareness." This means understanding the normal appearance and feel of your breasts and may include breast self-examination. Any changes detected, no matter how small, should be reported to a health care provider. Women in their 39s and 30s should have a clinical breast exam (CBE) by a health care provider as part of a regular health exam every 1 to 3 years. After age 58, women should have a CBE every year. Starting at age 42, women should consider having a mammogram (breast X-ray test) every year. Women who have a family history of breast cancer should talk to their health  care provider about genetic screening. Women at a high risk of breast cancer should talk to their health care providers about having an MRI and a mammogram every year.  Breast cancer gene (BRCA)-related cancer risk assessment is recommended for women who have family members with BRCA-related cancers. BRCA-related cancers include breast, ovarian, tubal, and peritoneal cancers. Having family members with these cancers may be associated with an increased risk for harmful changes (mutations) in the breast cancer genes BRCA1 and BRCA2. Results of the assessment will determine the need for genetic counseling and BRCA1 and BRCA2 testing.  The Pap test is a screening test for cervical cancer. A Pap test can show cell changes on the cervix that might become cervical cancer if left untreated. A Pap test is a procedure in which cells are obtained and examined from the lower end of the uterus (cervix).  Women should have a Pap test starting at age 58.  Between ages 63 and 65, Pap  tests should be repeated every 2 years.  Beginning at age 77, you should have a Pap test every 3 years as long as the past 3 Pap tests have been normal.  Some women have medical problems that increase the chance of getting cervical cancer. Talk to your health care provider about these problems. It is especially important to talk to your health care provider if a new problem develops soon after your last Pap test. In these cases, your health care provider may recommend more frequent screening and Pap tests.  The above recommendations are the same for women who have or have not gotten the vaccine for human papillomavirus (HPV).  If you had a hysterectomy for a problem that was not cancer or a condition that could lead to cancer, then you no longer need Pap tests. Even if you no longer need a Pap test, a regular exam is a good idea to make sure no other problems are starting.  If you are between ages 93 and 57 years, and you have had normal Pap tests going back 10 years, you no longer need Pap tests. Even if you no longer need a Pap test, a regular exam is a good idea to make sure no other problems are starting.  If you have had past treatment for cervical cancer or a condition that could lead to cancer, you need Pap tests and screening for cancer for at least 20 years after your treatment.  If Pap tests have been discontinued, risk factors (such as a new sexual partner) need to be reassessed to determine if screening should be resumed.  The HPV test is an additional test that may be used for cervical cancer screening. The HPV test looks for the virus that can cause the cell changes on the cervix. The cells collected during the Pap test can be tested for HPV. The HPV test could be used to screen women aged 75 years and older, and should be used in women of any age who have unclear Pap test results. After the age of 7, women should have HPV testing at the same frequency as a Pap test.  Colorectal  cancer can be detected and often prevented. Most routine colorectal cancer screening begins at the age of 56 years and continues through age 29 years. However, your health care provider may recommend screening at an earlier age if you have risk factors for colon cancer. On a yearly basis, your health care provider may provide home test kits to check for hidden blood  in the stool. Use of a small camera at the end of a tube, to directly examine the colon (sigmoidoscopy or colonoscopy), can detect the earliest forms of colorectal cancer. Talk to your health care provider about this at age 37, when routine screening begins. Direct exam of the colon should be repeated every 5 10 years through age 35 years, unless early forms of pre-cancerous polyps or small growths are found.  People who are at an increased risk for hepatitis B should be screened for this virus. You are considered at high risk for hepatitis B if:  You were born in a country where hepatitis B occurs often. Talk with your health care provider about which countries are considered high risk.  Your parents were born in a high-risk country and you have not received a shot to protect against hepatitis B (hepatitis B vaccine).  You have HIV or AIDS.  You use needles to inject street drugs.  You live with, or have sex with, someone who has Hepatitis B.  You get hemodialysis treatment.  You take certain medicines for conditions like cancer, organ transplantation, and autoimmune conditions.  Hepatitis C blood testing is recommended for all people born from [birth year] through 1966 and any individual with known risks for hepatitis C.  Practice safe sex. Use condoms and avoid high-risk sexual practices to reduce the spread of sexually transmitted infections (STIs). STIs include gonorrhea, chlamydia, syphilis, trichomonas, herpes, HPV, and human immunodeficiency virus (HIV). Herpes, HIV, and HPV are viral illnesses that have no cure. They can result in  disability, cancer, and death. Sexually active women aged 38 years and younger should be checked for chlamydia. Older women with new or multiple partners should also be tested for chlamydia. Testing for other STIs is recommended if you are sexually active and at increased risk.  Osteoporosis is a disease in which the bones lose minerals and strength with aging. This can result in serious bone fractures or breaks. The risk of osteoporosis can be identified using a bone density scan. Women ages 21 years and over and women at risk for fractures or osteoporosis should discuss screening with their health care providers. Ask your health care provider whether you should take a calcium supplement or vitamin D to reduce the rate of osteoporosis.  Menopause can be associated with physical symptoms and risks. Hormone replacement therapy is available to decrease symptoms and risks. You should talk to your health care provider about whether hormone replacement therapy is right for you.  Use sunscreen. Apply sunscreen liberally and repeatedly throughout the day. You should seek shade when your shadow is shorter than you. Protect yourself by wearing long sleeves, pants, a wide-brimmed hat, and sunglasses year round, whenever you are outdoors.  Once a month, do a whole body skin exam, using a mirror to look at the skin on your back. Tell your health care provider of new moles, moles that have irregular borders, moles that are larger than a pencil eraser, or moles that have changed in shape or color.  Stay current with required vaccines (immunizations).  Influenza vaccine. All adults should be immunized every year.  Tetanus, diphtheria, and acellular pertussis (Td, Tdap) vaccine. Pregnant women should receive 1 dose of Tdap vaccine during each pregnancy. The dose should be obtained regardless of the length of time since the last dose. Immunization is preferred during the 27th 36th week of gestation. An adult who has  not previously received Tdap or who does not know her vaccine status should  receive 1 dose of Tdap. This initial dose should be followed by tetanus and diphtheria toxoids (Td) booster doses every 10 years. Adults with an unknown or incomplete history of completing a 3-dose immunization series with Td-containing vaccines should begin or complete a primary immunization series including a Tdap dose. Adults should receive a Td booster every 10 years.  Varicella vaccine. An adult without evidence of immunity to varicella should receive 2 doses or a second dose if she has previously received 1 dose. Pregnant females who do not have evidence of immunity should receive the first dose after pregnancy. This first dose should be obtained before leaving the health care facility. The second dose should be obtained 4 8 weeks after the first dose.  Human papillomavirus (HPV) vaccine. Females aged 32 26 years who have not received the vaccine previously should obtain the 3-dose series. The vaccine is not recommended for use in pregnant females. However, pregnancy testing is not needed before receiving a dose. If a female is found to be pregnant after receiving a dose, no treatment is needed. In that case, the remaining doses should be delayed until after the pregnancy. Immunization is recommended for any person with an immunocompromised condition through the age of 48 years if she did not get any or all doses earlier. During the 3-dose series, the second dose should be obtained 4 8 weeks after the first dose. The third dose should be obtained 24 weeks after the first dose and 16 weeks after the second dose.  Zoster vaccine. One dose is recommended for adults aged 64 years or older unless certain conditions are present.  Measles, mumps, and rubella (MMR) vaccine. Adults born before [birth year] generally are considered immune to measles and mumps. Adults born in [birth year] or later should have 1 or more doses of MMR vaccine unless there  is a contraindication to the vaccine or there is laboratory evidence of immunity to each of the three diseases. A routine second dose of MMR vaccine should be obtained at least 28 days after the first dose for students attending postsecondary schools, health care workers, or international travelers. People who received inactivated measles vaccine or an unknown type of measles vaccine during 1963 1967 should receive 2 doses of MMR vaccine. People who received inactivated mumps vaccine or an unknown type of mumps vaccine before 1979 and are at high risk for mumps infection should consider immunization with 2 doses of MMR vaccine. For females of childbearing age, rubella immunity should be determined. If there is no evidence of immunity, females who are not pregnant should be vaccinated. If there is no evidence of immunity, females who are pregnant should delay immunization until after pregnancy. Unvaccinated health care workers born before [birth year] who lack laboratory evidence of measles, mumps, or rubella immunity or laboratory confirmation of disease should consider measles and mumps immunization with 2 doses of MMR vaccine or rubella immunization with 1 dose of MMR vaccine.  Pneumococcal 13-valent conjugate (PCV13) vaccine. When indicated, a person who is uncertain of her immunization history and has no record of immunization should receive the PCV13 vaccine. An adult aged 22 years or older who has certain medical conditions and has not been previously immunized should receive 1 dose of PCV13 vaccine. This PCV13 should be followed with a dose of pneumococcal polysaccharide (PPSV23) vaccine. The PPSV23 vaccine dose should be obtained at least 8 weeks after the dose of PCV13 vaccine. An adult aged 45 years or older who has certain medical conditions and previously  received 1 or more doses of PPSV23 vaccine should receive 1 dose of PCV13. The PCV13 vaccine dose should be obtained 1 or more years after the last PPSV23  vaccine dose.  Pneumococcal polysaccharide (PPSV23) vaccine. When PCV13 is also indicated, PCV13 should be obtained first. All adults aged 12 years and older should be immunized. An adult younger than age 60 years who has certain medical conditions should be immunized. Any person who resides in a nursing home or long-term care facility should be immunized. An adult smoker should be immunized. People with an immunocompromised condition and certain other conditions should receive both PCV13 and PPSV23 vaccines. People with human immunodeficiency virus (HIV) infection should be immunized as soon as possible after diagnosis. Immunization during chemotherapy or radiation therapy should be avoided. Routine use of PPSV23 vaccine is not recommended for American Indians, [place] Natives, or people younger than 65 years unless there are medical conditions that require PPSV23 vaccine. When indicated, people who have unknown immunization and have no record of immunization should receive PPSV23 vaccine. One-time revaccination 5 years after the first dose of PPSV23 is recommended for people aged 72 64 years who have chronic kidney failure, nephrotic syndrome, asplenia, or immunocompromised conditions. People who received 1 2 doses of PPSV23 before age 75 years should receive another dose of PPSV23 vaccine at age 62 years or later if at least 5 years have passed since the previous dose. Doses of PPSV23 are not needed for people immunized with PPSV23 at or after age 26 years.  Meningococcal vaccine. Adults with asplenia or persistent complement component deficiencies should receive 2 doses of quadrivalent meningococcal conjugate (MenACWY-D) vaccine. The doses should be obtained at least 2 months apart. Microbiologists working with certain meningococcal bacteria, [employer] recruits, people at risk during an outbreak, and people who travel to or live in countries with a high rate of meningitis should be immunized. A first-year  college student up through age 71 years who is living in a residence hall should receive a dose if she did not receive a dose on or after her 16th birthday. Adults who have certain high-risk conditions should receive one or more doses of vaccine.  Hepatitis A vaccine. Adults who wish to be protected from this disease, have certain high-risk conditions, work with hepatitis A-infected animals, work in hepatitis A research labs, or travel to or work in countries with a high rate of hepatitis A should be immunized. Adults who were previously unvaccinated and who anticipate close contact with an international adoptee during the first 60 days after arrival in the [place] from a country with a high rate of hepatitis A should be immunized.  Hepatitis B vaccine. Adults who wish to be protected from this disease, have certain high-risk conditions, may be exposed to blood or other infectious body fluids, are household contacts or sex partners of hepatitis B positive people, are clients or workers in certain care facilities, or travel to or work in countries with a high rate of hepatitis B should be immunized.  Haemophilus influenzae type b (Hib) vaccine. A previously unvaccinated person with asplenia or sickle cell disease or having a scheduled splenectomy should receive 1 dose of Hib vaccine. Regardless of previous immunization, a recipient of a hematopoietic stem cell transplant should receive a 3-dose series 6 12 months after her successful transplant. Hib vaccine is not recommended for adults with HIV infection. Preventive Services / Frequency Ages 67 to 39years  Blood pressure check.** / Every 1 to 2 years.  Lipid and cholesterol check.** / Every 5 years beginning at age 104.  Clinical breast exam.** / Every 3 years for women in their 12s and 21s.  BRCA-related cancer risk assessment.** / For women who have family members with a BRCA-related cancer (breast, ovarian, tubal, or peritoneal  cancers).  Pap test.** / Every 2 years from ages 52 through 58. Every 3 years starting at age 38 through age 65 or 90 with a history of 3 consecutive normal Pap tests.  HPV screening.** / Every 3 years from ages 82 through ages 91 to 60 with a history of 3 consecutive normal Pap tests.  Hepatitis C blood test.** / For any individual with known risks for hepatitis C.  Skin self-exam. / Monthly.  Influenza vaccine. / Every year.  Tetanus, diphtheria, and acellular pertussis (Tdap, Td) vaccine.** / Consult your health care provider. Pregnant women should receive 1 dose of Tdap vaccine during each pregnancy. 1 dose of Td every 10 years.  Varicella vaccine.** / Consult your health care provider. Pregnant females who do not have evidence of immunity should receive the first dose after pregnancy.  HPV vaccine. / 3 doses over 6 months, if 44 and younger. The vaccine is not recommended for use in pregnant females. However, pregnancy testing is not needed before receiving a dose.  Measles, mumps, rubella (MMR) vaccine.** / You need at least 1 dose of MMR if you were born in 1957 or later. You may also need a 2nd dose. For females of childbearing age, rubella immunity should be determined. If there is no evidence of immunity, females who are not pregnant should be vaccinated. If there is no evidence of immunity, females who are pregnant should delay immunization until after pregnancy.  Pneumococcal 13-valent conjugate (PCV13) vaccine.** / Consult your health care provider.  Pneumococcal polysaccharide (PPSV23) vaccine.** / 1 to 2 doses if you smoke cigarettes or if you have certain conditions.  Meningococcal vaccine.** / 1 dose if you are age 59 to 2 years and a Market researcher living in a residence hall, or have one of several medical conditions, you need to get vaccinated against meningococcal disease. You may also need additional booster doses.  Hepatitis A vaccine.** / Consult your  health care provider.  Hepatitis B vaccine.** / Consult your health care provider.  Haemophilus influenzae type b (Hib) vaccine.** / Consult your health care provider. Ages 76 to 64years  Blood pressure check.** / Every 1 to 2 years.  Lipid and cholesterol check.** / Every 5 years beginning at age 86 years.  Lung cancer screening. / Every year if you are aged 61 80 years and have a 30-pack-year history of smoking and currently smoke or have quit within the past 15 years. Yearly screening is stopped once you have quit smoking for at least 15 years or develop a health problem that would prevent you from having lung cancer treatment.  Clinical breast exam.** / Every year after age 48 years.  BRCA-related cancer risk assessment.** / For women who have family members with a BRCA-related cancer (breast, ovarian, tubal, or peritoneal cancers).  Mammogram.** / Every year beginning at age 76 years and continuing for as long as you are in good health. Consult with your health care provider.  Pap test.** / Every 3 years starting at age 36 years through age 30 or 63 years with a history of 3 consecutive normal Pap tests.  HPV screening.** / Every 3 years from ages 50 years through ages 32 to 50  years with a history of 3 consecutive normal Pap tests.  Fecal occult blood test (FOBT) of stool. / Every year beginning at age 1 years and continuing until age 88 years. You may not need to do this test if you get a colonoscopy every 10 years.  Flexible sigmoidoscopy or colonoscopy.** / Every 5 years for a flexible sigmoidoscopy or every 10 years for a colonoscopy beginning at age 26 years and continuing until age 70 years.  Hepatitis C blood test.** / For all people born from [birth year] through 1966 and any individual with known risks for hepatitis C.  Skin self-exam. / Monthly.  Influenza vaccine. / Every year.  Tetanus, diphtheria, and acellular pertussis (Tdap/Td) vaccine.** / Consult your health care  provider. Pregnant women should receive 1 dose of Tdap vaccine during each pregnancy. 1 dose of Td every 10 years.  Varicella vaccine.** / Consult your health care provider. Pregnant females who do not have evidence of immunity should receive the first dose after pregnancy.  Zoster vaccine.** / 1 dose for adults aged 41 years or older.  Measles, mumps, rubella (MMR) vaccine.** / You need at least 1 dose of MMR if you were born in 1957 or later. You may also need a 2nd dose. For females of childbearing age, rubella immunity should be determined. If there is no evidence of immunity, females who are not pregnant should be vaccinated. If there is no evidence of immunity, females who are pregnant should delay immunization until after pregnancy.  Pneumococcal 13-valent conjugate (PCV13) vaccine.** / Consult your health care provider.  Pneumococcal polysaccharide (PPSV23) vaccine.** / 1 to 2 doses if you smoke cigarettes or if you have certain conditions.  Meningococcal vaccine.** / Consult your health care provider.  Hepatitis A vaccine.** / Consult your health care provider.  Hepatitis B vaccine.** / Consult your health care provider.  Haemophilus influenzae type b (Hib) vaccine.** / Consult your health care provider. Ages 55 years and over  Blood pressure check.** / Every 1 to 2 years.  Lipid and cholesterol check.** / Every 5 years beginning at age 41 years.  Lung cancer screening. / Every year if you are aged 53 80 years and have a 30-pack-year history of smoking and currently smoke or have quit within the past 15 years. Yearly screening is stopped once you have quit smoking for at least 15 years or develop a health problem that would prevent you from having lung cancer treatment.  Clinical breast exam.** / Every year after age 88 years.  BRCA-related cancer risk assessment.** / For women who have family members with a BRCA-related cancer (breast, ovarian, tubal, or peritoneal  cancers).  Mammogram.** / Every year beginning at age 69 years and continuing for as long as you are in good health. Consult with your health care provider.  Pap test.** / Every 3 years starting at age 47 years through age 73 or 13 years with 3 consecutive normal Pap tests. Testing can be stopped between 65 and 70 years with 3 consecutive normal Pap tests and no abnormal Pap or HPV tests in the past 10 years.  HPV screening.** / Every 3 years from ages 55 years through ages 6 or 54 years with a history of 3 consecutive normal Pap tests. Testing can be stopped between 65 and 70 years with 3 consecutive normal Pap tests and no abnormal Pap or HPV tests in the past 10 years.  Fecal occult blood test (FOBT) of stool. / Every year beginning at age 21  years and continuing until age 102 years. You may not need to do this test if you get a colonoscopy every 10 years.  Flexible sigmoidoscopy or colonoscopy.** / Every 5 years for a flexible sigmoidoscopy or every 10 years for a colonoscopy beginning at age 28 years and continuing until age 28 years.  Hepatitis C blood test.** / For all people born from [birth year] through 1966 and any individual with known risks for hepatitis C.  Osteoporosis screening.** / A one-time screening for women ages 54 years and over and women at risk for fractures or osteoporosis.  Skin self-exam. / Monthly.  Influenza vaccine. / Every year.  Tetanus, diphtheria, and acellular pertussis (Tdap/Td) vaccine.** / 1 dose of Td every 10 years.  Varicella vaccine.** / Consult your health care provider.  Zoster vaccine.** / 1 dose for adults aged 30 years or older.  Pneumococcal 13-valent conjugate (PCV13) vaccine.** / Consult your health care provider.  Pneumococcal polysaccharide (PPSV23) vaccine.** / 1 dose for all adults aged 21 years and older.  Meningococcal vaccine.** / Consult your health care provider.  Hepatitis A vaccine.** / Consult your health care  provider.  Hepatitis B vaccine.** / Consult your health care provider.  Haemophilus influenzae type b (Hib) vaccine.** / Consult your health care provider. ** Family history and personal history of risk and conditions may change your health care provider's recommendations. Document Released: 10/16/2001 Document Revised: 06/10/2013 Document Reviewed: 01/15/2011 [facility] Patient Information 2014 [place], [place].

## 2014-01-20 NOTE — Assessment & Plan Note (Signed)
Her BP is well controlled I will monitor her lytes and renal function today 

## 2014-01-20 NOTE — Assessment & Plan Note (Addendum)
I will check a plain xray to see if there is a fracture or boney lesion She has taken tramadol for pain without much relief I have asked her to add some otc nsaids as well for the pain

## 2014-01-20 NOTE — Assessment & Plan Note (Signed)
Her blood sugars have been well controlled I will recheck her A1C today and will monitor her renal function

## 2014-01-20 NOTE — Assessment & Plan Note (Addendum)

## 2014-01-26 ENCOUNTER — Telehealth: Payer: Self-pay | Admitting: Internal Medicine

## 2014-01-26 NOTE — Telephone Encounter (Signed)
Rec'd from [facility] forward 6 pages to Dr. Ronnald Ramp

## 2014-02-05 DIAGNOSIS — E119 Type 2 diabetes mellitus without complications: Secondary | ICD-10-CM | POA: Diagnosis not present

## 2014-02-05 DIAGNOSIS — H40039 Anatomical narrow angle, unspecified eye: Secondary | ICD-10-CM | POA: Diagnosis not present

## 2014-02-05 LAB — HM DIABETES EYE EXAM

## 2014-02-10 ENCOUNTER — Ambulatory Visit (INDEPENDENT_AMBULATORY_CARE_PROVIDER_SITE_OTHER): Payer: Medicare Other | Admitting: Internal Medicine

## 2014-02-10 ENCOUNTER — Encounter: Payer: Self-pay | Admitting: Internal Medicine

## 2014-02-10 VITALS — BP 136/78 | HR 77 | Temp 98.4°F | Resp 16 | Wt 132.0 lb

## 2014-02-10 DIAGNOSIS — IMO0001 Reserved for inherently not codable concepts without codable children: Secondary | ICD-10-CM | POA: Diagnosis not present

## 2014-02-10 DIAGNOSIS — I2510 Atherosclerotic heart disease of native coronary artery without angina pectoris: Secondary | ICD-10-CM | POA: Diagnosis not present

## 2014-02-10 DIAGNOSIS — I10 Essential (primary) hypertension: Secondary | ICD-10-CM

## 2014-02-10 DIAGNOSIS — E1165 Type 2 diabetes mellitus with hyperglycemia: Secondary | ICD-10-CM

## 2014-02-10 LAB — HM DIABETES FOOT EXAM

## 2014-02-10 MED ORDER — NITROGLYCERIN 0.4 MG SL SUBL: 0.4000 mg | SUBLINGUAL_TABLET | SUBLINGUAL | Status: DC | PRN

## 2014-02-10 NOTE — Patient Instructions (Signed)
Type 2 Diabetes Mellitus, Adult Type 2 diabetes mellitus, often simply referred to as type 2 diabetes, is a long-lasting (chronic) disease. In type 2 diabetes, the pancreas does not make enough insulin (a hormone), the cells are less responsive to the insulin that is made (insulin resistance), or both. Normally, insulin moves sugars from food into the tissue cells. The tissue cells use the sugars for energy. The lack of insulin or the lack of normal response to insulin causes excess sugars to build up in the blood instead of going into the tissue cells. As a result, high blood sugar (hyperglycemia) develops. The effect of high sugar (glucose) levels can cause many complications. Type 2 diabetes was also previously called adult-onset diabetes but it can occur at any age.  RISK FACTORS  A person is predisposed to developing type 2 diabetes if someone in the family has the disease and also has one or more of the following primary risk factors:  Overweight.  An inactive lifestyle.  A history of consistently eating high-calorie foods. Maintaining a normal weight and regular physical activity can reduce the chance of developing type 2 diabetes. SYMPTOMS  A person with type 2 diabetes may not show symptoms initially. The symptoms of type 2 diabetes appear slowly. The symptoms include:  Increased thirst (polydipsia).  Increased urination (polyuria).  Increased urination during the night (nocturia).  Weight loss. This weight loss may be rapid.  Frequent, recurring infections.  Tiredness (fatigue).  Weakness.  Vision changes, such as blurred vision.  Fruity smell to your breath.  Abdominal pain.  Nausea or vomiting.  Cuts or bruises which are slow to heal.  Tingling or numbness in the hands or feet. DIAGNOSIS Type 2 diabetes is frequently not diagnosed until complications of diabetes are present. Type 2 diabetes is diagnosed when symptoms or complications are present and when blood  glucose levels are increased. Your blood glucose level may be checked by one or more of the following blood tests:  A fasting blood glucose test. You will not be allowed to eat for at least 8 hours before a blood sample is taken.  A random blood glucose test. Your blood glucose is checked at any time of the day regardless of when you ate.  A hemoglobin A1c blood glucose test. A hemoglobin A1c test provides information about blood glucose control over the previous 3 months.  An oral glucose tolerance test (OGTT). Your blood glucose is measured after you have not eaten (fasted) for 2 hours and then after you drink a glucose-containing beverage. TREATMENT   You may need to take insulin or diabetes medicine daily to keep blood glucose levels in the desired range.  You will need to match insulin dosing with exercise and healthy food choices. The treatment goal is to maintain the before meal blood sugar (preprandial glucose) level at 70 130 mg/dL. HOME CARE INSTRUCTIONS   Have your hemoglobin A1c level checked twice a year.  Perform daily blood glucose monitoring as directed by your caregiver.  Monitor urine ketones when you are ill and as directed by your caregiver.  Take your diabetes medicine or insulin as directed by your caregiver to maintain your blood glucose levels in the desired range.  Never run out of diabetes medicine or insulin. It is needed every day.  Adjust insulin based on your intake of carbohydrates. Carbohydrates can raise blood glucose levels but need to be included in your diet. Carbohydrates provide vitamins, minerals, and fiber which are an essential part of   a healthy diet. Carbohydrates are found in fruits, vegetables, whole grains, dairy products, legumes, and foods containing added sugars.    Eat healthy foods. Alternate 3 meals with 3 snacks.  Lose weight if overweight.  Carry a medical alert card or wear your medical alert jewelry.  Carry a 15 gram  carbohydrate snack with you at all times to treat low blood glucose (hypoglycemia). Some examples of 15 gram carbohydrate snacks include:  Glucose tablets, 3 or 4   Glucose gel, 15 gram tube  Raisins, 2 tablespoons (24 grams)  Jelly beans, 6  Animal crackers, 8  Regular pop, 4 ounces (120 mL)  Gummy treats, 9  Recognize hypoglycemia. Hypoglycemia occurs with blood glucose levels of 70 mg/dL and below. The risk for hypoglycemia increases when fasting or skipping meals, during or after intense exercise, and during sleep. Hypoglycemia symptoms can include:  Tremors or shakes.  Decreased ability to concentrate.  Sweating.  Increased heart rate.  Headache.  Dry mouth.  Hunger.  Irritability.  Anxiety.  Restless sleep.  Altered speech or coordination.  Confusion.  Treat hypoglycemia promptly. If you are alert and able to safely swallow, follow the 15:15 rule:  Take 15 20 grams of rapid-acting glucose or carbohydrate. Rapid-acting options include glucose gel, glucose tablets, or 4 ounces (120 mL) of fruit juice, regular soda, or low fat milk.  Check your blood glucose level 15 minutes after taking the glucose.  Take 15 20 grams more of glucose if the repeat blood glucose level is still 70 mg/dL or below.  Eat a meal or snack within 1 hour once blood glucose levels return to normal.    Be alert to polyuria and polydipsia which are early signs of hyperglycemia. An early awareness of hyperglycemia allows for prompt treatment. Treat hyperglycemia as directed by your caregiver.  Engage in at least 150 minutes of moderate-intensity physical activity a week, spread over at least 3 days of the week or as directed by your caregiver. In addition, you should engage in resistance exercise at least 2 times a week or as directed by your caregiver.  Adjust your medicine and food intake as needed if you start a new exercise or sport.  Follow your sick day plan at any time you  are unable to eat or drink as usual.  Avoid tobacco use.  Limit alcohol intake to no more than 1 drink per day for nonpregnant women and 2 drinks per day for men. You should drink alcohol only when you are also eating food. Talk with your caregiver whether alcohol is safe for you. Tell your caregiver if you drink alcohol several times a week.  Follow up with your caregiver regularly.  Schedule an eye exam soon after the diagnosis of type 2 diabetes and then annually.  Perform daily skin and foot care. Examine your skin and feet daily for cuts, bruises, redness, nail problems, bleeding, blisters, or sores. A foot exam by a caregiver should be done annually.  Brush your teeth and gums at least twice a day and floss at least once a day. Follow up with your dentist regularly.  Share your diabetes management plan with your workplace or school.  Stay up-to-date with immunizations.  Learn to manage stress.  Obtain ongoing diabetes education and support as needed.  Participate in, or seek rehabilitation as needed to maintain or improve independence and quality of life. Request a physical or occupational therapy referral if you are having foot or hand numbness or difficulties with grooming,   dressing, eating, or physical activity. SEEK MEDICAL CARE IF:   You are unable to eat food or drink fluids for more than 6 hours.  You have nausea and vomiting for more than 6 hours.  Your blood glucose level is over 240 mg/dL.  There is a change in mental status.  You develop an additional serious illness.  You have diarrhea for more than 6 hours.  You have been sick or have had a fever for a couple of days and are not getting better.  You have pain during any physical activity.  SEEK IMMEDIATE MEDICAL CARE IF:  You have difficulty breathing.  You have moderate to large ketone levels. MAKE SURE YOU:  Understand these instructions.  Will watch your condition.  Will get help right away if  you are not doing well or get worse. Document Released: 08/20/2005 Document Revised: 05/14/2012 Document Reviewed: 03/18/2012 [employer] Patient Information 2014 [employer].  

## 2014-02-10 NOTE — Progress Notes (Signed)
Pre visit review using our clinic review tool, if applicable. No additional management support is needed unless otherwise documented below in the visit note. 

## 2014-02-10 NOTE — Progress Notes (Signed)
Subjective:    Patient ID: [name] [name], female    DOB: 1941-05-01, 73 y.o.   MRN: [id number]  Diabetes She presents for her follow-up diabetic visit. She has type 2 diabetes mellitus. Her disease course has been stable. There are no hypoglycemic associated symptoms. Pertinent negatives for hypoglycemia include no dizziness, headaches or tremors. Pertinent negatives for diabetes include no blurred vision, no chest pain, no fatigue, no foot paresthesias, no foot ulcerations, no polydipsia, no polyphagia, no polyuria, no visual change, no weakness and no weight loss. There are no hypoglycemic complications. There are no diabetic complications. Current diabetic treatment includes oral agent (monotherapy). She is compliant with treatment all of the time. Her weight is decreasing steadily. She is following a generally healthy diet. She participates in exercise intermittently. There is no change in her home blood glucose trend. An ACE inhibitor/angiotensin II receptor blocker is being taken. She does not see a podiatrist.Eye exam is current.      Review of Systems  Constitutional: Negative.  Negative for fever, chills, weight loss, diaphoresis, appetite change and fatigue.  HENT: Negative.   Eyes: Negative.  Negative for blurred vision.  Respiratory: Negative.  Negative for cough, choking, chest tightness, shortness of breath, wheezing and stridor.   Cardiovascular: Negative.  Negative for chest pain, palpitations and leg swelling.  Gastrointestinal: Negative.  Negative for nausea, vomiting, abdominal pain, diarrhea and blood in stool.  Endocrine: Negative.  Negative for polydipsia, polyphagia and polyuria.  Genitourinary: Negative.   Musculoskeletal: Negative.  Negative for arthralgias, back pain and myalgias.  Skin: Negative.  Negative for rash.  Allergic/Immunologic: Negative.   Neurological: Negative.  Negative for dizziness, tremors, facial asymmetry, weakness, light-headedness and  headaches.  Hematological: Negative.  Negative for adenopathy. Does not bruise/bleed easily.  Psychiatric/Behavioral: Negative.        Objective:   Physical Exam  Vitals reviewed. Constitutional: She is oriented to person, place, and time. She appears well-developed and well-nourished. No distress.  HENT:  Head: Normocephalic and atraumatic.  Mouth/Throat: Oropharynx is clear and moist. No oropharyngeal exudate.  Eyes: Conjunctivae are normal. Right eye exhibits no discharge. Left eye exhibits no discharge. No scleral icterus.  Neck: Normal range of motion. Neck supple. No JVD present. No tracheal deviation present. No thyromegaly present.  Cardiovascular: Normal rate, regular rhythm, normal heart sounds and intact distal pulses.  Exam reveals no gallop and no friction rub.   No murmur heard. Pulmonary/Chest: Effort normal and breath sounds normal. No stridor. No respiratory distress. She has no wheezes. She has no rales. She exhibits no tenderness.  Abdominal: Soft. Bowel sounds are normal. She exhibits no distension and no mass. There is no tenderness. There is no rebound and no guarding.  Musculoskeletal: Normal range of motion. She exhibits no edema and no tenderness.  Lymphadenopathy:    She has no cervical adenopathy.  Neurological: She is oriented to person, place, and time.  Skin: Skin is warm and dry. No rash noted. She is not diaphoretic. No erythema. No pallor.  Psychiatric: She has a normal mood and affect. Her behavior is normal. Judgment and thought content normal.     Lab Results  Component Value Date   WBC 11.6* 01/20/2014   HGB 14.1 01/20/2014   HCT 41.7 01/20/2014   PLT 333.0 01/20/2014   GLUCOSE 116* 01/20/2014   CHOL 160 01/20/2014   TRIG 138.0 01/20/2014   HDL 47.10 01/20/2014   LDLDIRECT 74.6 04/10/2011   LDLCALC 85 01/20/2014   ALT  20 06/15/2013   AST 22 06/15/2013   NA 136 01/20/2014   K 4.7 01/20/2014   CL 96 01/20/2014   CREATININE 0.8 01/20/2014   BUN 17  01/20/2014   CO2 33* 01/20/2014   TSH 2.11 01/20/2014   INR 1.54* 09/28/2011   HGBA1C 6.6* 01/20/2014       Assessment & Plan:

## 2014-02-10 NOTE — Assessment & Plan Note (Signed)
Her blood sugars are well controlled 

## 2014-02-10 NOTE — Assessment & Plan Note (Signed)
Her blood pressure is well controlled. 

## 2014-02-11 ENCOUNTER — Other Ambulatory Visit: Payer: Self-pay | Admitting: Pulmonary Disease

## 2014-02-21 ENCOUNTER — Other Ambulatory Visit: Payer: Self-pay | Admitting: Pulmonary Disease

## 2014-02-22 NOTE — Telephone Encounter (Signed)
Pt daughter wanting to know if medication will be filled by dr Lenna Gilford or dr t Deirdre Peer can be reached @ [phone].Hillery Hunter

## 2014-02-24 ENCOUNTER — Other Ambulatory Visit: Payer: Self-pay | Admitting: Pulmonary Disease

## 2014-02-25 ENCOUNTER — Other Ambulatory Visit: Payer: Self-pay | Admitting: Pulmonary Disease

## 2014-03-02 ENCOUNTER — Other Ambulatory Visit: Payer: Self-pay | Admitting: Pulmonary Disease

## 2014-03-02 ENCOUNTER — Other Ambulatory Visit: Payer: Self-pay | Admitting: *Deleted

## 2014-03-02 MED ORDER — DICYCLOMINE HCL 10 MG PO CAPS: ORAL_CAPSULE | ORAL | Status: DC

## 2014-03-02 MED ORDER — LOSARTAN POTASSIUM 100 MG PO TABS: ORAL_TABLET | ORAL | Status: DC

## 2014-03-02 MED ORDER — PRAVASTATIN SODIUM 40 MG PO TABS: ORAL_TABLET | ORAL | Status: DC

## 2014-03-02 NOTE — Telephone Encounter (Signed)
Pt stated the pharmacists told her to contact new pcp to send new prescriptions in on her Bentyl, pravastatin, and losartan. Inform pt will send pravastatin and losartan. The Bentyl has to come from Dr. Ronnald Ramp & he has already left for today. Will send tomorrow once he approves...Johny Chess

## 2014-03-03 NOTE — Telephone Encounter (Signed)
Pt notified all meds has been sent to [place].Marland KitchenJohny Chess

## 2014-03-21 ENCOUNTER — Other Ambulatory Visit: Payer: Self-pay | Admitting: Pulmonary Disease

## 2014-03-22 ENCOUNTER — Other Ambulatory Visit: Payer: Self-pay

## 2014-03-22 MED ORDER — METFORMIN HCL 500 MG PO TABS: ORAL_TABLET | ORAL | Status: DC

## 2014-04-21 ENCOUNTER — Other Ambulatory Visit: Payer: Self-pay | Admitting: Pulmonary Disease

## 2014-04-23 ENCOUNTER — Emergency Department (HOSPITAL_COMMUNITY): Payer: Medicare Other

## 2014-04-23 ENCOUNTER — Telehealth: Payer: Self-pay | Admitting: Internal Medicine

## 2014-04-23 ENCOUNTER — Encounter (HOSPITAL_COMMUNITY): Payer: Self-pay | Admitting: Emergency Medicine

## 2014-04-23 ENCOUNTER — Emergency Department (HOSPITAL_COMMUNITY)
Admission: EM | Admit: 2014-04-23 | Discharge: 2014-04-23 | Disposition: A | Discharge status: Home / Self Care | Payer: Medicare Other | Attending: Emergency Medicine | Admitting: Emergency Medicine

## 2014-04-23 DIAGNOSIS — Y929 Unspecified place or not applicable: Secondary | ICD-10-CM | POA: Insufficient documentation

## 2014-04-23 DIAGNOSIS — Z8739 Personal history of other diseases of the musculoskeletal system and connective tissue: Secondary | ICD-10-CM | POA: Insufficient documentation

## 2014-04-23 DIAGNOSIS — W19XXXA Unspecified fall, initial encounter: Secondary | ICD-10-CM

## 2014-04-23 DIAGNOSIS — Z79899 Other long term (current) drug therapy: Secondary | ICD-10-CM | POA: Insufficient documentation

## 2014-04-23 DIAGNOSIS — K219 Gastro-esophageal reflux disease without esophagitis: Secondary | ICD-10-CM | POA: Diagnosis not present

## 2014-04-23 DIAGNOSIS — I10 Essential (primary) hypertension: Secondary | ICD-10-CM | POA: Diagnosis not present

## 2014-04-23 DIAGNOSIS — Z9889 Other specified postprocedural states: Secondary | ICD-10-CM | POA: Insufficient documentation

## 2014-04-23 DIAGNOSIS — F3289 Other specified depressive episodes: Secondary | ICD-10-CM | POA: Diagnosis not present

## 2014-04-23 DIAGNOSIS — Z7902 Long term (current) use of antithrombotics/antiplatelets: Secondary | ICD-10-CM | POA: Diagnosis not present

## 2014-04-23 DIAGNOSIS — S8001XA Contusion of right knee, initial encounter: Secondary | ICD-10-CM

## 2014-04-23 DIAGNOSIS — I129 Hypertensive chronic kidney disease with stage 1 through stage 4 chronic kidney disease, or unspecified chronic kidney disease: Secondary | ICD-10-CM | POA: Insufficient documentation

## 2014-04-23 DIAGNOSIS — Y9389 Activity, other specified: Secondary | ICD-10-CM | POA: Insufficient documentation

## 2014-04-23 DIAGNOSIS — E782 Mixed hyperlipidemia: Secondary | ICD-10-CM | POA: Diagnosis not present

## 2014-04-23 DIAGNOSIS — N189 Chronic kidney disease, unspecified: Secondary | ICD-10-CM | POA: Insufficient documentation

## 2014-04-23 DIAGNOSIS — S8990XA Unspecified injury of unspecified lower leg, initial encounter: Secondary | ICD-10-CM | POA: Insufficient documentation

## 2014-04-23 DIAGNOSIS — S7001XA Contusion of right hip, initial encounter: Secondary | ICD-10-CM

## 2014-04-23 DIAGNOSIS — S8000XA Contusion of unspecified knee, initial encounter: Secondary | ICD-10-CM | POA: Diagnosis not present

## 2014-04-23 DIAGNOSIS — S61209A Unspecified open wound of unspecified finger without damage to nail, initial encounter: Secondary | ICD-10-CM | POA: Insufficient documentation

## 2014-04-23 DIAGNOSIS — I2510 Atherosclerotic heart disease of native coronary artery without angina pectoris: Secondary | ICD-10-CM | POA: Insufficient documentation

## 2014-04-23 DIAGNOSIS — Z7982 Long term (current) use of aspirin: Secondary | ICD-10-CM | POA: Insufficient documentation

## 2014-04-23 DIAGNOSIS — S99929A Unspecified injury of unspecified foot, initial encounter: Secondary | ICD-10-CM

## 2014-04-23 DIAGNOSIS — W108XXA Fall (on) (from) other stairs and steps, initial encounter: Secondary | ICD-10-CM | POA: Insufficient documentation

## 2014-04-23 DIAGNOSIS — S79919A Unspecified injury of unspecified hip, initial encounter: Secondary | ICD-10-CM | POA: Diagnosis not present

## 2014-04-23 DIAGNOSIS — S99919A Unspecified injury of unspecified ankle, initial encounter: Secondary | ICD-10-CM | POA: Diagnosis not present

## 2014-04-23 DIAGNOSIS — S7000XA Contusion of unspecified hip, initial encounter: Secondary | ICD-10-CM | POA: Insufficient documentation

## 2014-04-23 DIAGNOSIS — M25559 Pain in unspecified hip: Secondary | ICD-10-CM | POA: Diagnosis not present

## 2014-04-23 DIAGNOSIS — G02 Meningitis in other infectious and parasitic diseases classified elsewhere: Secondary | ICD-10-CM | POA: Insufficient documentation

## 2014-04-23 DIAGNOSIS — F329 Major depressive disorder, single episode, unspecified: Secondary | ICD-10-CM | POA: Insufficient documentation

## 2014-04-23 DIAGNOSIS — S79929A Unspecified injury of unspecified thigh, initial encounter: Secondary | ICD-10-CM | POA: Diagnosis not present

## 2014-04-23 DIAGNOSIS — M25569 Pain in unspecified knee: Secondary | ICD-10-CM | POA: Diagnosis not present

## 2014-04-23 DIAGNOSIS — S61216A Laceration without foreign body of right little finger without damage to nail, initial encounter: Secondary | ICD-10-CM

## 2014-04-23 MED ORDER — TRAMADOL HCL 50 MG PO TABS: 50.0000 mg | ORAL_TABLET | Freq: Four times a day (QID) | ORAL | Status: DC | PRN

## 2014-04-23 MED ORDER — TRAMADOL HCL 50 MG PO TABS
50.0000 mg | ORAL_TABLET | Freq: Once | ORAL | Status: AC
  Administered 2014-04-23: 50 mg via ORAL
  Filled 2014-04-23: qty 1

## 2014-04-23 MED ORDER — ACETAMINOPHEN 500 MG PO TABS
500.0000 mg | ORAL_TABLET | Freq: Once | ORAL | Status: AC
  Administered 2014-04-23: 500 mg via ORAL
  Filled 2014-04-23: qty 1

## 2014-04-23 NOTE — ED Notes (Signed)
Pt sts lost balance and fell down 2 stairs outside. Pt is on blood thinner and has pain and swelling to right knee, leg and hip, cut to right pinky finger. Pt also has a lot of bruising to arms. Denies hitting head.

## 2014-04-23 NOTE — Telephone Encounter (Signed)
Patient Information:  Caller Name: [name]  Phone: [phone]  Patient: [name] [name], [name] [name]  Gender: Female  DOB: August 08, 1941  Age: 73 Years  PCP: [name] (Adults only)  Office Follow Up:  Does the office need to follow up with this patient?: Yes  Instructions For The Office: giving heads up about sending patient to [facility] ER - unable to stop bleeding of finger for over 30 minutes applying constant pressure, more bandages, ice. Will probably need multiple xrays on leg, knee, arm and elbow due to large lumps at those areas, pain to bear weight. Was not able to reach office to discuss whether to send her to office instead.  RN Note:  Unable to reach office - will probably need xrays as well as immediate help to stop bleeding.  Sending to [facility] ER.  Symptoms  Reason For Call & Symptoms: Lost balance and fell out her back door, landed on brick patio about 30 minutes ago.Marland Kitchen  Has very large knot/lump on left kneecap, large lump on leg, 2 lumps on arm.  Has severe laceration under left little finger. Is applying ice but lot of pain to bear weight.  Can't stop bleeding of finger, applying pressure, bandaids tightly, extra dressings and ice but blood still running out.  Reviewed Health History In EMR: Yes  Reviewed Medications In EMR: Yes  Reviewed Allergies In EMR: Yes  Reviewed Surgeries / Procedures: Yes  Date of Onset of Symptoms: 04/23/2014  Treatments Tried: Applying pressure and ice  Treatments Tried Worked: No  Guideline(s) Used:  Finger Injury  Knee Injury  Disposition Per Guideline:   Go to ED Now (or to Office with PCP Approval)  Reason For Disposition Reached:   Bleeding won't stop after 10 minutes of direct pressure (using correct technique)  Advice Given:  N/A  Patient Will Follow Care Advice:  YES

## 2014-04-23 NOTE — ED Notes (Signed)
Ambulatory in room with use of own crutch for support

## 2014-04-23 NOTE — ED Notes (Signed)
Returned from xray

## 2014-04-23 NOTE — ED Provider Notes (Signed)
Medical screening examination/treatment/procedure(s) were conducted as a shared visit with non-physician practitioner(s) and myself.  I personally evaluated the patient during the encounter.   EKG Interpretation None     Right hip nontender with good active and passive range of motion right lateral thigh hematoma right knee patient able to extend against resistance with stable varus and valgus stress testing without pain with negative Lachman's and negative McMurray's testing with the right anterior knee hematoma  Babette Relic, MD 05/04/14 [phone]

## 2014-04-23 NOTE — Discharge Instructions (Signed)
Tylenol and tramadol for pain. Keep knee elevated. Ice several times a day. Suture removal in 7 days. Follow up with primary care doctor.    Contusion A contusion is a deep bruise. Contusions are the result of an injury that caused bleeding under the skin. The contusion may turn blue, purple, or yellow. Minor injuries will give you a painless contusion, but more severe contusions may stay painful and swollen for a few weeks.  CAUSES  A contusion is usually caused by a blow, trauma, or direct force to an area of the body. SYMPTOMS   Swelling and redness of the injured area.  Bruising of the injured area.  Tenderness and soreness of the injured area.  Pain. DIAGNOSIS  The diagnosis can be made by taking a history and physical exam. An X-ray, CT scan, or MRI may be needed to determine if there were any associated injuries, such as fractures. TREATMENT  Specific treatment will depend on what area of the body was injured. In general, the best treatment for a contusion is resting, icing, elevating, and applying cold compresses to the injured area. Over-the-counter medicines may also be recommended for pain control. Ask your caregiver what the best treatment is for your contusion. HOME CARE INSTRUCTIONS   Put ice on the injured area.  Put ice in a plastic bag.  Place a towel between your skin and the bag.  Leave the ice on for 15-20 minutes, 3-4 times a day, or as directed by your health care provider.  Only take over-the-counter or prescription medicines for pain, discomfort, or fever as directed by your caregiver. Your caregiver may recommend avoiding anti-inflammatory medicines (aspirin, ibuprofen, and naproxen) for 48 hours because these medicines may increase bruising.  Rest the injured area.  If possible, elevate the injured area to reduce swelling. SEEK IMMEDIATE MEDICAL CARE IF:   You have increased bruising or swelling.  You have pain that is getting worse.  Your swelling  or pain is not relieved with medicines. MAKE SURE YOU:   Understand these instructions.  Will watch your condition.  Will get help right away if you are not doing well or get worse. Document Released: 05/30/2005 Document Revised: 08/25/2013 Document Reviewed: 06/25/2011 [facility] Patient Information 2015 [place], [place]. This information is not intended to replace advice given to you by your health care provider. Make sure you discuss any questions you have with your health care provider.

## 2014-04-23 NOTE — ED Provider Notes (Signed)
CSN: [id number]     Arrival date & time 04/23/14  1536 History   First MD Initiated Contact with Patient 04/23/14 1717     Chief Complaint  Patient presents with  . Fall     (Consider location/radiation/quality/duration/timing/severity/associated sxs/prior Treatment) HPI [name] [name] is a 73 y.o. female who presents to ED with complaint of a fall. Pt states she missed the last step and fell into right knee and bilateral elbows. Reports pain to right knee, right hip, and laceration to the right little finger. Pt states she has trouble ambulating due to pain and swelling in the right knee and unable to stop bleeding from the laceration. Pt states she does take plavix and aspirin 81mg  daily.  Denies hitting her head on the ground, no LOC. No pain to the back. No difficulties moving bilateral upper extremities. No dizziness, lightheadiness, chest pain, sob.    Past Medical History  Diagnosis Date  . HTN (hypertension)   . CAD (coronary artery disease)     1.  inf MI 2007 - tx with DES to RCA;  2.  Echocardiogram 08/2007: EF 60%, mild MR, mild LAE.  3.  Last LHC 08/2009: dLM 20%, LAD beyond diagonal 60-70%, stable from prior catheterization in 2007, ostial circumflex 40-50%, mid RCA stent okay, EF 65%. ;  4.  myoview was recommended 08/2009: EF 76%, small inferoapical fixed defect, no ischemia  5.  Myoview 7/13: no scar or ischemia, EF 69%  . Premature ventricular contractions   . Cerebrovascular disease, unspecified   . Mixed hyperlipidemia   . DM type 2 (diabetes mellitus, type 2)   . GERD (gastroesophageal reflux disease)   . Diverticular disease   . IBS (irritable bowel syndrome)   . Urinary incontinence   . DJD (degenerative joint disease)   . Chronic neck pain   . Chronic pain syndrome   . Dizziness   . Anxiety   . 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 H/O hiatal hernia   . Nocturia   . Chronic kidney disease     frequent Kidney Infections  . Pancreatitis      1955 an once more  . PONV (postoperative nausea and vomiting)     Difficluty opening mouth wide and turning head. (Cervical Fusion)  . Mitral valve prolapse    Past Surgical History  Procedure Laterality Date  . Cervical spine surgery  07/2005    Dr Consuello Masse  . Bladder repair  2007  . Total abdominal hysterectomy    . Tonsillectomy    . Thumb surgery    . Neck surgery      has had 3 surgeries  . Temporomandibular joint surgery    . Left total knee replacement  11/2010    Dr Percell Miller  . I&d knee with poly exchange  09/25/2011    Procedure: IRRIGATION AND DEBRIDEMENT KNEE WITH POLY EXCHANGE;  Surgeon: Johnny Bridge, MD;  Location: [facility];  Service: Orthopedics;  Laterality: Left;  . Cardiac catheterization  2007    Stents  . Appendectomy  1955  . Tonsillectomy  1950  . Cholecystectomy  2007  . Eye surgery  2011    Bilteral  . Joint replacement  2012    left  . Incision and drainage abscess / hematoma of bursa / knee / thigh  09/2011  . Posterior cervical fusion/foraminotomy  04/08/2012    Procedure: POSTERIOR CERVICAL FUSION/FORAMINOTOMY LEVEL 2;  Surgeon: Otilio Connors, MD;  Location: [facility];  Service: Neurosurgery;  Laterality: Left;  Left Cervical six-seven Foraminotomy, bilateral cervical seven-thoracic one foraminotomy, cervical six-seven, cervical seven-thoracic one fusion with posterior instrumentation  . Dental surgery  05/2013    replaced an inplant   Family History  Problem Relation Age of Onset  . Heart disease Mother   . Coronary artery disease Other     sibling  . Coronary artery disease Other     sibling   History  Substance Use Topics  . Smoking status: Never Smoker   . Smokeless tobacco: Never Used  . Alcohol Use: No   OB History   Grav Para Term Preterm Abortions TAB SAB Ect Mult Living                 Review of Systems  Constitutional: Negative for fever and chills.  Respiratory: Negative for cough, chest tightness and shortness of breath.    Cardiovascular: Negative for chest pain, palpitations and leg swelling.  Gastrointestinal: Negative for nausea, vomiting, abdominal pain and diarrhea.  Genitourinary: Negative for dysuria, flank pain and pelvic pain.  Musculoskeletal: Positive for arthralgias and joint swelling. Negative for myalgias, neck pain and neck stiffness.  Skin: Positive for wound. Negative for rash.  Neurological: Negative for dizziness, weakness, light-headedness, numbness and headaches.  All other systems reviewed and are negative.     Allergies  Codeine; Niacin and related; Prednisone; Sulfonamide derivatives; and Tetracycline  Home Medications   Prior to Admission medications   Medication Sig Start Date End Date Taking? Authorizing Provider  amLODipine (NORVASC) 5 MG tablet TAKE ONE TABLET BY MOUTH ONCE DAILY 11/11/13   Sherren Mocha, MD  Ascorbic Acid (VITAMIN C) 1000 MG tablet Take 1,000 mg by mouth daily.      Historical Provider, MD  aspirin EC 81 MG tablet Take 81 mg by mouth every morning.     Historical Provider, MD  calcium carbonate (OS-CAL) 600 MG TABS Take 600 mg by mouth 2 (two) times daily with a meal.      Historical Provider, MD  Cholecalciferol (VITAMIN D3) 1000 UNITS CAPS Take 1 capsule by mouth daily.      Historical Provider, MD  Cinnamon 500 MG capsule Take 500 mg by mouth 2 (two) times daily.     Historical Provider, MD  clopidogrel (PLAVIX) 75 MG tablet TAKE ONE TABLET BY MOUTH ONCE DAILY 10/06/13   Noralee Space, MD  [facility] Liver Oil 1000 MG CAPS Take 1 capsule by mouth 2 (two) times daily.      Historical Provider, MD  Coenzyme Q10 (CO Q 10) 100 MG CAPS Take 1 capsule by mouth daily.      Historical Provider, MD  dicyclomine (BENTYL) 10 MG capsule TAKE ONE CAPSULE BY MOUTH THREE TIMES DAILY FOR  ABDOMINAL  CRAMPS 03/02/14   Janith Lima, MD  Flaxseed, Linseed, 1000 MG CAPS Take 1 capsule by mouth 2 (two) times daily.     Historical Provider, MD  furosemide (LASIX) 20 MG tablet TAKE ONE  TABLET BY MOUTH EVERY DAY 06/15/13   Noralee Space, MD  gabapentin (NEURONTIN) 300 MG capsule Take 1 capsule (300 mg total) by mouth 2 (two) times daily. 06/15/13   Noralee Space, MD  Glucosamine-Chondroit-Vit C-Mn (GLUCOSAMINE CHONDR 1500 COMPLX PO) Take 1 capsule by mouth 2 (two) times daily.      Historical Provider, MD  losartan (COZAAR) 100 MG tablet TAKE ONE TABLET BY MOUTH ONCE DAILY 03/02/14   Janith Lima, MD  metFORMIN (GLUCOPHAGE) 500 MG tablet TAKE ONE TABLET BY MOUTH TWICE DAILY 03/22/14   Janith Lima, MD  metoprolol (LOPRESSOR) 100 MG tablet TAKE ONE TABLET BY MOUTH TWICE DAILY 11/11/13   Sherren Mocha, MD  Multiple Vitamin (STRESSTABS PO) Take 1 tablet by mouth at bedtime.      Historical Provider, MD  niacin (NIASPAN) 500 MG CR tablet Take 1,000 mg by mouth at bedtime.    Historical Provider, MD  nitroGLYCERIN (NITROSTAT) 0.4 MG SL tablet Place 1 tablet (0.4 mg total) under the tongue every 5 (five) minutes as needed. For chest pain 02/10/14   Janith Lima, MD  Omega-3 Fatty Acids (FISH OIL) 1000 MG CAPS Take 1 capsule by mouth 4 (four) times daily.      Historical Provider, MD  omeprazole (PRILOSEC) 20 MG capsule TAKE TWO CAPSULES BY MOUTH ONCE DAILY 06/15/13   Noralee Space, MD  pravastatin (PRAVACHOL) 40 MG tablet TAKE ONE TABLET BY MOUTH IN THE EVENING 03/02/14   Janith Lima, MD  psyllium (REGULOID) 0.52 G capsule Take 0.52 g by mouth 2 (two) times daily.      Historical Provider, MD  Red Yeast Rice 600 MG CAPS Take 1 capsule by mouth 2 (two) times daily.      Historical Provider, MD  traMADol (ULTRAM) 50 MG tablet Take 1 tablet (50 mg total) by mouth 3 (three) times daily as needed for pain. 06/15/13   Noralee Space, MD  venlafaxine (EFFEXOR) 37.5 MG tablet TAKE ONE TABLET BY MOUTH ONCE DAILY 10/06/13   Noralee Space, MD   BP 138/69  Pulse 68  Temp(Src) 98.3 F (36.8 C)  Resp 16  SpO2 97% Physical Exam  Nursing note and vitals reviewed. Constitutional: She appears  well-developed and well-nourished. No distress.  HENT:  Head: Normocephalic and atraumatic.  Eyes: Conjunctivae are normal.  Neck: Normal range of motion. Neck supple.  Cardiovascular: Normal rate, regular rhythm and normal heart sounds.   Pulmonary/Chest: Effort normal and breath sounds normal. No respiratory distress. She has no wheezes. She has no rales.  Abdominal: Soft. Bowel sounds are normal. She exhibits no distension. There is no tenderness. There is no rebound.  Musculoskeletal: She exhibits no edema.  Swelling to the right anterior knee, tender to palpation over patella, pain with flexion extension of the knee. Joint is stable with negative anterior posterior drawer signs. No laxity or pain with medial lateral stress. Contusion noted to the right hip, tender to palpation, pain with flexion and extension, pain with internal/external rotation, full range of motion of the right hip. Swelling noted to the entire right fifth finger, 2 cm laceration to the distal aspect does not involve the nail, actively bleeding, gaping. No midline cervical, thoracic, lumbar spine tenderness.  Neurological: She is alert.  Skin: Skin is warm and dry.  Psychiatric: She has a normal mood and affect. Her behavior is normal.    ED Course  Procedures (including critical care time) Labs Review Labs Reviewed - No data to display  Imaging Review Dg Hip Complete Right  04/23/2014   CLINICAL DATA:  Pain post trauma  EXAM: RIGHT HIP - COMPLETE 2+ VIEW  COMPARISON:  None.  FINDINGS: Frontal pelvis as well as frontal and lateral right hip images were obtained. There is slight symmetric narrowing of both hip joints. No fracture or dislocation. There are surgical clips in the lower pelvis.  IMPRESSION: No fracture or dislocation. Slight symmetric narrowing of both hip joints.  Electronically Signed   By: Lowella Grip M.D.   On: 04/23/2014 19:04   Dg Knee Complete 4 Views Right  04/23/2014   CLINICAL DATA:   Pain post trauma  EXAM: RIGHT KNEE - COMPLETE 4+ VIEW  COMPARISON:  None.  FINDINGS: Frontal, lateral, and bilateral oblique views were obtained. There is no fracture, dislocation, or effusion. There is mild narrowing medially and laterally. There is slight spurring medially. No erosive change. Note that there is prepatellar soft tissue edema with questionable hematoma in this area.  IMPRESSION: Relatively mild osteoarthritic change. No fracture or effusion. Suspect prepatellar soft tissue hematoma.   Electronically Signed   By: Lowella Grip M.D.   On: 04/23/2014 19:02   Dg Finger Little Right  04/23/2014   CLINICAL DATA:  Status post trauma involving the fifth finger. There is a known laceration  EXAM: RIGHT LITTLE FINGER 2+V  COMPARISON:  None  FINDINGS: The bones of the fifth finger are adequately mineralized. There is mild degenerative interphalangeal joint change. There is no acute fracture nor dislocation. No soft tissue foreign bodies are demonstrated. Bandage material is present.  IMPRESSION: There is no acute bony abnormality of the right fifth finger.   Electronically Signed   By: David  Martinique   On: 04/23/2014 19:05     EKG Interpretation None      LACERATION REPAIR Performed by: Renold Genta Authorized by: Jeannett Senior A Consent: Verbal consent obtained. Risks and benefits: risks, benefits and alternatives were discussed Consent given by: patient Patient identity confirmed: provided demographic data Prepped and Draped in normal sterile fashion Wound explored  Laceration Location: right fifth finger  Laceration Length: 2cm  No Foreign Bodies seen or palpated  Anesthesia: local infiltration  Local anesthetic: lidocaine 2% wo epinephrine  Anesthetic total: 2 ml  Irrigation method: syringe Amount of cleaning: standard  Skin closure: prolene 5.0  Number of sutures: 4  Technique: simple interrupted  Patient tolerance: Patient tolerated the  procedure well with no immediate complications.  MDM   Final diagnoses:  Contusion of right knee, initial encounter  Contusion of right hip, initial encounter  Fall, initial encounter  Laceration of fifth finger of right hand, initial encounter    Patient is here after a mechanical fall, pain to the right knee, right hip, right fifth finger with a laceration. Laceration repaired with sutures. Bleeding is controlled. X-rays of the knee and hip obtained and are negative. Patient ambulated with a crutches she brought with her. Discussed with Dr. Stevie Kern, who has seen patient. Patient is stable for discharge, will treat her pain with tramadol and Tylenol, followup with primary care Dr. There is no head trauma, no spinal tenderness or pain to her back or her neck. No other complaints  Filed Vitals:   04/23/14 1745 04/23/14 1800 04/23/14 1815 04/23/14 1830  BP: 137/81 145/55 138/59 118/56  Pulse: 66 63 63 66  Temp:      Resp: 16 12 17 15   SpO2: 97% 100% 100% 98%  \    Kayman Snuffer A Iden Stripling, PA-C 04/24/14 0016

## 2014-04-27 ENCOUNTER — Other Ambulatory Visit: Payer: Self-pay

## 2014-04-27 ENCOUNTER — Ambulatory Visit (INDEPENDENT_AMBULATORY_CARE_PROVIDER_SITE_OTHER): Payer: Medicare Other | Admitting: Internal Medicine

## 2014-04-27 ENCOUNTER — Encounter: Payer: Self-pay | Admitting: Internal Medicine

## 2014-04-27 VITALS — BP 162/78 | HR 76 | Temp 98.5°F | Wt 127.8 lb

## 2014-04-27 DIAGNOSIS — R269 Unspecified abnormalities of gait and mobility: Secondary | ICD-10-CM | POA: Diagnosis not present

## 2014-04-27 DIAGNOSIS — S8991XS Unspecified injury of right lower leg, sequela: Secondary | ICD-10-CM

## 2014-04-27 DIAGNOSIS — IMO0001 Reserved for inherently not codable concepts without codable children: Secondary | ICD-10-CM

## 2014-04-27 DIAGNOSIS — T7589XS Other specified effects of external causes, sequela: Secondary | ICD-10-CM | POA: Diagnosis not present

## 2014-04-27 DIAGNOSIS — W19XXXS Unspecified fall, sequela: Secondary | ICD-10-CM | POA: Diagnosis not present

## 2014-04-27 DIAGNOSIS — I2510 Atherosclerotic heart disease of native coronary artery without angina pectoris: Secondary | ICD-10-CM

## 2014-04-27 DIAGNOSIS — W1809XS Striking against other object with subsequent fall, sequela: Secondary | ICD-10-CM

## 2014-04-27 DIAGNOSIS — R2689 Other abnormalities of gait and mobility: Secondary | ICD-10-CM

## 2014-04-27 MED ORDER — CLOPIDOGREL BISULFATE 75 MG PO TABS: 75.0000 mg | ORAL_TABLET | Freq: Every day | ORAL | Status: DC

## 2014-04-27 NOTE — Progress Notes (Signed)
   Subjective:    Patient ID: [name] [name], female    DOB: 1941/08/29, 73 y.o.   MRN: [id number]  HPI   She had a mechanical fall 04/23/14 approximately 2:30 PM when she missed the last step in front of her house, falling onto brickwork. She injured her right knee, hip, and lacerated the right fifth digit. There was significant bleeding in the context of her being on Plavix and aspirin from her cardiologist. She had a myocardial infarction in 2007  There was no  head injury or loss of consciousness. There was no neurologic or cardiologic prodrome prior to the fall.  Films in the ER revealed no fractures. No labs or EKG were done.  The laceration was sutured and she was discharged with crutches because of the residual pain and swelling in the right knee.  Although this was a mechanical fall she states over the last several weeks she's had some imbalance, feeling as if she is falling forward.   Her most recent A1c was 6.6% in May of this year. At that time lipids were essentially at goal with an LDL of 85.  No PMH diabetic neuropathy.   Review of Systems  Denied were any change in heart rhythm or rate prior to the event. There was no associated chest pain or shortness of breath .  Also specifically denied prior to the episode were headache, limb weakness, tingling, or numbness. No seizure activity noted.      Objective:   Physical Exam   Positive or pertinent findings include: She is alert and oriented x3. She has excellent range of motion of the neck despite a past history of cervical spine surgery. ? Subtle intermittent  head tremor suggested( denied by patient) She has isolated DIP changes of the right index finger.There is suturing and distal ecchymosis of the fifth right digit She has extensive bruising over the upper extremities and right knee.The right knee is tender to palpation and range of motion is decreased. Grade one half systolic murmur. Dorsalis pedis pulses are  slightly decreased but present.  General appearance :adequately nourished; in no distress. Eyes: No conjunctival inflammation or scleral icterus is present. Oral exam: Dental hygiene is good. Lips and gums are healthy appearing.There is no oropharyngeal erythema or exudate noted.  Heart:  Normal rate and regular rhythm. S1 and S2 normal without gallop, murmur, click, rub or other extra sounds   Lungs:Chest clear to auscultation; no wheezes, rhonchi,rales ,or rubs present.No increased work of breathing.  Abdomen: bowel sounds normal, soft and non-tender without masses, organomegaly or hernias noted.  No guarding or rebound.  Skin:Warm & dry.  Intact without suspicious lesions or rashes ; no jaundice or tenting Lymphatic: No lymphadenopathy is noted about the head, neck, axilla            Assessment & Plan:  #1 mechanical fall in the context of a recent sensation of falling forward.? Intermittent subtle head tremor clinically  #2 diabetes, well controlled  Plan: Physical therapy to evaluate gait and risk of falls. If there is any question of neuromuscular dysfunction;consultation with Dr. Carles Collet, movement disorder specialist would be recommended.

## 2014-04-27 NOTE — Patient Instructions (Signed)
The Physical Therapy referral will be scheduled and you'll be notified of the time.

## 2014-04-27 NOTE — Progress Notes (Signed)
Pre visit review using our clinic review tool, if applicable. No additional management support is needed unless otherwise documented below in the visit note. 

## 2014-04-28 ENCOUNTER — Other Ambulatory Visit: Payer: Self-pay

## 2014-04-28 ENCOUNTER — Telehealth: Payer: Self-pay | Admitting: Pulmonary Disease

## 2014-04-28 MED ORDER — VENLAFAXINE HCL ER 37.5 MG PO CP24: 37.5000 mg | ORAL_CAPSULE | Freq: Every day | ORAL | Status: DC

## 2014-04-28 NOTE — Telephone Encounter (Signed)
Called and spoke with pts daughter and she stated that the pt has been out of her effexor x 1.5 weeks.  Pt was told by her new PCP office that they could not fill this medication for her and that she would have to call her other doctor for this.    [facility] has filled this in the past for the pt.  i have sent in a refill for the medication to her pharmacy.  The daughter is aware that [facility] will not be able to refill this medication again and that she will have to have this filled by her new PCP.  They will follow up with that office.  Nothing further is needed.

## 2014-04-29 DIAGNOSIS — S8000XA Contusion of unspecified knee, initial encounter: Secondary | ICD-10-CM | POA: Diagnosis not present

## 2014-04-30 ENCOUNTER — Ambulatory Visit (INDEPENDENT_AMBULATORY_CARE_PROVIDER_SITE_OTHER): Payer: Medicare Other | Admitting: Internal Medicine

## 2014-04-30 ENCOUNTER — Encounter: Payer: Self-pay | Admitting: Internal Medicine

## 2014-04-30 VITALS — BP 140/80 | HR 78 | Temp 98.6°F | Wt 130.0 lb

## 2014-04-30 DIAGNOSIS — I2510 Atherosclerotic heart disease of native coronary artery without angina pectoris: Secondary | ICD-10-CM | POA: Diagnosis not present

## 2014-04-30 DIAGNOSIS — Z4802 Encounter for removal of sutures: Secondary | ICD-10-CM

## 2014-04-30 NOTE — Progress Notes (Signed)
Pre visit review using our clinic review tool, if applicable. No additional management support is needed unless otherwise documented below in the visit note. 

## 2014-04-30 NOTE — Patient Instructions (Signed)
   Simple cleansing as discussed; no antibiotic ointment.Report any warning signs as discussed.

## 2014-04-30 NOTE — Progress Notes (Signed)
   Subjective:    Patient ID: [name] [name], female    DOB: 1940-11-05, 73 y.o.   MRN: [id number]  HPI  She is here to have the sutures removed from the right fifth digit.  She denies any signs of infection or cellulitis.  She is also had no neuro or cardiac symptoms. She does have some imbalance. She is scheduled to see physical therapy.  She has begun to use both crutches to enhance stability.    Review of Systems  No change in heart rhythm or rate ; no  chest pain or shortness of breath .  Also specifically denied  are headache, limb weakness, tingling, or numbness. No seizure activity noted.      Objective:   Physical Exam she appears well-nourished and in no distress.  She is able to ambulate without crutches.  She has no epitrochlear or axillary lymphadenopathy.  The laceration is well-healed with no evidence of purulence or cellulitis.  The laceration was cleansed with Betadine and 4 sutures removed without difficulty.            Assessment & Plan:  #1 suture removed without difficulty. No evidence of cellulitis or abscess.  Plan: Simple cleansing without application of any antibiotics topically was recommended. Warning signs were discussed

## 2014-05-11 ENCOUNTER — Ambulatory Visit: Payer: Medicare Other | Attending: Internal Medicine

## 2014-05-11 DIAGNOSIS — M25569 Pain in unspecified knee: Secondary | ICD-10-CM | POA: Diagnosis not present

## 2014-05-11 DIAGNOSIS — IMO0001 Reserved for inherently not codable concepts without codable children: Secondary | ICD-10-CM | POA: Insufficient documentation

## 2014-05-20 ENCOUNTER — Other Ambulatory Visit: Payer: Self-pay | Admitting: Cardiovascular Disease

## 2014-05-21 ENCOUNTER — Ambulatory Visit (INDEPENDENT_AMBULATORY_CARE_PROVIDER_SITE_OTHER): Payer: Medicare Other

## 2014-05-21 VITALS — BP 130/62

## 2014-05-21 DIAGNOSIS — Z23 Encounter for immunization: Secondary | ICD-10-CM | POA: Diagnosis not present

## 2014-05-25 ENCOUNTER — Encounter: Payer: Medicare Other | Admitting: Rehabilitation

## 2014-05-27 DIAGNOSIS — S8000XA Contusion of unspecified knee, initial encounter: Secondary | ICD-10-CM | POA: Diagnosis not present

## 2014-06-01 ENCOUNTER — Encounter: Payer: Medicare Other | Admitting: Rehabilitation

## 2014-06-11 ENCOUNTER — Ambulatory Visit: Payer: Medicare Other | Admitting: Internal Medicine

## 2014-06-13 ENCOUNTER — Other Ambulatory Visit: Payer: Self-pay | Admitting: Internal Medicine

## 2014-06-13 DIAGNOSIS — R269 Unspecified abnormalities of gait and mobility: Secondary | ICD-10-CM

## 2014-06-17 ENCOUNTER — Encounter: Payer: Self-pay | Admitting: Internal Medicine

## 2014-06-17 ENCOUNTER — Other Ambulatory Visit (INDEPENDENT_AMBULATORY_CARE_PROVIDER_SITE_OTHER): Payer: Medicare Other

## 2014-06-17 ENCOUNTER — Ambulatory Visit (INDEPENDENT_AMBULATORY_CARE_PROVIDER_SITE_OTHER)
Admission: RE | Admit: 2014-06-17 | Discharge: 2014-06-17 | Disposition: A | Discharge status: Home / Self Care | Payer: Medicare Other | Source: Ambulatory Visit | Attending: Internal Medicine | Admitting: Internal Medicine

## 2014-06-17 ENCOUNTER — Ambulatory Visit (INDEPENDENT_AMBULATORY_CARE_PROVIDER_SITE_OTHER): Payer: Medicare Other | Admitting: Internal Medicine

## 2014-06-17 VITALS — BP 130/64 | HR 68 | Temp 99.0°F | Resp 16 | Ht 61.0 in | Wt 128.0 lb

## 2014-06-17 DIAGNOSIS — E785 Hyperlipidemia, unspecified: Secondary | ICD-10-CM | POA: Diagnosis not present

## 2014-06-17 DIAGNOSIS — J202 Acute bronchitis due to streptococcus: Secondary | ICD-10-CM | POA: Insufficient documentation

## 2014-06-17 DIAGNOSIS — I2510 Atherosclerotic heart disease of native coronary artery without angina pectoris: Secondary | ICD-10-CM

## 2014-06-17 DIAGNOSIS — I10 Essential (primary) hypertension: Secondary | ICD-10-CM

## 2014-06-17 DIAGNOSIS — R05 Cough: Secondary | ICD-10-CM

## 2014-06-17 DIAGNOSIS — E118 Type 2 diabetes mellitus with unspecified complications: Secondary | ICD-10-CM

## 2014-06-17 DIAGNOSIS — R059 Cough, unspecified: Secondary | ICD-10-CM

## 2014-06-17 LAB — COMPREHENSIVE METABOLIC PANEL
ALBUMIN: 3.8 g/dL (ref 3.5–5.2)
ALK PHOS: 77 U/L (ref 39–117)
ALT: 19 U/L (ref 0–35)
AST: 22 U/L (ref 0–37)
BUN: 8 mg/dL (ref 6–23)
CO2: 34 mEq/L — ABNORMAL HIGH (ref 19–32)
Calcium: 9.3 mg/dL (ref 8.4–10.5)
Chloride: 94 mEq/L — ABNORMAL LOW (ref 96–112)
Creatinine, Ser: 1 mg/dL (ref 0.4–1.2)
GFR: 54.96 mL/min — ABNORMAL LOW (ref >60.00)
GLUCOSE: 129 mg/dL — AB (ref 70–99)
POTASSIUM: 4.2 meq/L (ref 3.5–5.1)
Sodium: 137 mEq/L (ref 135–145)
TOTAL PROTEIN: 8 g/dL (ref 6.0–8.3)
Total Bilirubin: 0.5 mg/dL (ref 0.2–1.2)

## 2014-06-17 LAB — CBC WITH DIFFERENTIAL/PLATELET
Basophils Absolute: 0.1 10*3/uL (ref 0.0–0.1)
Basophils Relative: 0.6 % (ref 0.0–3.0)
EOS ABS: 0.2 10*3/uL (ref 0.0–0.7)
Eosinophils Relative: 1.3 % (ref 0.0–5.0)
HCT: 45.3 % (ref 36.0–46.0)
Hemoglobin: 14.6 g/dL (ref 12.0–15.0)
Lymphocytes Relative: 23.8 % (ref 12.0–46.0)
Lymphs Abs: 3 10*3/uL (ref 0.7–4.0)
MCHC: 32.3 g/dL (ref 30.0–36.0)
MCV: 95.8 fl (ref 78.0–100.0)
MONO ABS: 0.8 10*3/uL (ref 0.1–1.0)
Monocytes Relative: 6.5 % (ref 3.0–12.0)
NEUTROS PCT: 67.8 % (ref 43.0–77.0)
Neutro Abs: 8.6 10*3/uL — ABNORMAL HIGH (ref 1.4–7.7)
Platelets: 324 10*3/uL (ref 150.0–400.0)
RBC: 4.73 Mil/uL (ref 3.87–5.11)
RDW: 13.4 % (ref 11.5–15.5)
WBC: 12.7 10*3/uL — AB (ref 4.0–10.5)

## 2014-06-17 LAB — HEMOGLOBIN A1C: Hgb A1c MFr Bld: 6.3 % (ref 4.6–6.5)

## 2014-06-17 LAB — TSH: TSH: 2.44 u[IU]/mL (ref 0.35–4.50)

## 2014-06-17 MED ORDER — PROMETHAZINE-DM 6.25-15 MG/5ML PO SYRP
5.0000 mL | ORAL_SOLUTION | Freq: Four times a day (QID) | ORAL | Status: DC | PRN
Start: 2014-06-17 — End: 2014-06-29

## 2014-06-17 MED ORDER — AZITHROMYCIN 500 MG PO TABS: 500.0000 mg | ORAL_TABLET | Freq: Every day | ORAL | Status: DC

## 2014-06-17 NOTE — Progress Notes (Signed)
Pre visit review using our clinic review tool, if applicable. No additional management support is needed unless otherwise documented below in the visit note. 

## 2014-06-17 NOTE — Patient Instructions (Signed)
Cough, Adult  A cough is a reflex that helps clear your throat and airways. It can help heal the body or may be a reaction to an irritated airway. A cough may only last 2 or 3 weeks (acute) or may last more than 8 weeks (chronic).  CAUSES Acute cough:  Viral or bacterial infections. Chronic cough:  Infections.  Allergies.  Asthma.  Post-nasal drip.  Smoking.  Heartburn or acid reflux.  Some medicines.  Chronic lung problems (COPD).  Cancer. SYMPTOMS   Cough.  Fever.  Chest pain.  Increased breathing rate.  High-pitched whistling sound when breathing (wheezing).  Colored mucus that you cough up (sputum). TREATMENT   A bacterial cough may be treated with antibiotic medicine.  A viral cough must run its course and will not respond to antibiotics.  Your caregiver may recommend other treatments if you have a chronic cough. HOME CARE INSTRUCTIONS   Only take over-the-counter or prescription medicines for pain, discomfort, or fever as directed by your caregiver. Use cough suppressants only as directed by your caregiver.  Use a cold steam vaporizer or humidifier in your bedroom or home to help loosen secretions.  Sleep in a semi-upright position if your cough is worse at night.  Rest as needed.  Stop smoking if you smoke. SEEK IMMEDIATE MEDICAL CARE IF:   You have pus in your sputum.  Your cough starts to worsen.  You cannot control your cough with suppressants and are losing sleep.  You begin coughing up blood.  You have difficulty breathing.  You develop pain which is getting worse or is uncontrolled with medicine.  You have a fever. MAKE SURE YOU:   Understand these instructions.  Will watch your condition.  Will get help right away if you are not doing well or get worse. Document Released: 02/16/2011 Document Revised: 11/12/2011 Document Reviewed: 02/16/2011 [employer] Patient Information 2015 [employer]. This information is not intended  to replace advice given to you by your health care provider. Make sure you discuss any questions you have with your health care provider.  

## 2014-06-17 NOTE — Progress Notes (Signed)
Subjective:    Patient ID: [name] [name], female    DOB: [birth year], 73 y.o.   MRN: [id number]  Cough This is a new problem. Episode onset: 3 days ago. The problem has been unchanged. The problem occurs every few hours. The cough is productive of purulent sputum. Associated symptoms include chills, a fever, rhinorrhea and a sore throat. Pertinent negatives include no chest pain, ear congestion, ear pain, headaches, heartburn, hemoptysis, myalgias, nasal congestion, postnasal drip, rash, shortness of breath, sweats, weight loss or wheezing. Nothing aggravates the symptoms. She has tried nothing for the symptoms. The treatment provided no relief. Her past medical history is significant for pneumonia.      Review of Systems  Constitutional: Positive for fever, chills and fatigue. Negative for weight loss, diaphoresis, activity change, appetite change and unexpected weight change.  HENT: Positive for congestion, rhinorrhea, sinus pressure and sore throat. Negative for ear pain, postnasal drip, sneezing, tinnitus, trouble swallowing and voice change.   Eyes: Negative.   Respiratory: Positive for cough. Negative for hemoptysis, choking, chest tightness, shortness of breath, wheezing and stridor.   Cardiovascular: Negative.  Negative for chest pain, palpitations and leg swelling.  Gastrointestinal: Negative.  Negative for heartburn, nausea, vomiting, abdominal pain, diarrhea, constipation and blood in stool.  Endocrine: Negative.   Genitourinary: Negative.   Musculoskeletal: Negative.  Negative for arthralgias, back pain and myalgias.  Skin: Negative.  Negative for rash.  Allergic/Immunologic: Negative.   Neurological: Negative.  Negative for dizziness and headaches.  Hematological: Negative.  Negative for adenopathy. Does not bruise/bleed easily.  Psychiatric/Behavioral: Negative.        Objective:   Physical Exam  Vitals reviewed. Constitutional: She is oriented to person, place, and  time. She appears well-developed and well-nourished. No distress.  HENT:  Head: Normocephalic and atraumatic.  Mouth/Throat: Oropharynx is clear and moist. No oropharyngeal exudate.  Eyes: Conjunctivae are normal. Right eye exhibits no discharge. Left eye exhibits no discharge. No scleral icterus.  Neck: Normal range of motion. Neck supple. No JVD present. No tracheal deviation present. No thyromegaly present.  Cardiovascular: Normal rate, regular rhythm, normal heart sounds and intact distal pulses.  Exam reveals no gallop and no friction rub.   No murmur heard. Pulmonary/Chest: Effort normal and breath sounds normal. No stridor. No respiratory distress. She has no wheezes. She has no rales. She exhibits no tenderness.  Abdominal: Soft. Bowel sounds are normal. She exhibits no distension and no mass. There is no tenderness. There is no rebound and no guarding.  Musculoskeletal: Normal range of motion. She exhibits no edema and no tenderness.  Lymphadenopathy:    She has no cervical adenopathy.  Neurological: She is oriented to person, place, and time.  Skin: Skin is warm and dry. No rash noted. She is not diaphoretic. No erythema. No pallor.     Lab Results  Component Value Date   WBC 11.6* 01/20/2014   HGB 14.1 01/20/2014   HCT 41.7 01/20/2014   PLT 333.0 01/20/2014   GLUCOSE 116* 01/20/2014   CHOL 160 01/20/2014   TRIG 138.0 01/20/2014   HDL 47.10 01/20/2014   LDLDIRECT 74.6 04/10/2011   LDLCALC 85 01/20/2014   ALT 20 06/15/2013   AST 22 06/15/2013   NA 136 01/20/2014   K 4.7 01/20/2014   CL 96 01/20/2014   CREATININE 0.8 01/20/2014   BUN 17 01/20/2014   CO2 33* 01/20/2014   TSH 2.11 01/20/2014   INR 1.54* 09/28/2011   HGBA1C 6.6* 01/20/2014  Assessment & Plan:

## 2014-06-18 ENCOUNTER — Other Ambulatory Visit: Payer: Self-pay | Admitting: Pulmonary Disease

## 2014-06-18 ENCOUNTER — Encounter: Payer: Self-pay | Admitting: Internal Medicine

## 2014-06-18 NOTE — Assessment & Plan Note (Signed)
Will treat the infection with Zpak and will control the cough with phenergan-dm

## 2014-06-18 NOTE — Assessment & Plan Note (Signed)
Her blood sugars are well controlled 

## 2014-06-18 NOTE — Assessment & Plan Note (Signed)
Her BP is well controlled 

## 2014-06-18 NOTE — Assessment & Plan Note (Signed)
Her CXR is normal.

## 2014-06-24 ENCOUNTER — Other Ambulatory Visit: Payer: Self-pay | Admitting: Pulmonary Disease

## 2014-06-25 ENCOUNTER — Ambulatory Visit: Payer: Medicare Other | Admitting: Neurology

## 2014-06-28 ENCOUNTER — Telehealth: Payer: Self-pay | Admitting: Internal Medicine

## 2014-06-28 MED ORDER — OMEPRAZOLE 20 MG PO CPDR: 40.0000 mg | DELAYED_RELEASE_CAPSULE | Freq: Every day | ORAL | Status: DC

## 2014-06-28 MED ORDER — GABAPENTIN 300 MG PO CAPS: 300.0000 mg | ORAL_CAPSULE | Freq: Two times a day (BID) | ORAL | Status: DC

## 2014-06-28 MED ORDER — FUROSEMIDE 20 MG PO TABS: 20.0000 mg | ORAL_TABLET | Freq: Every day | ORAL | Status: DC

## 2014-06-28 NOTE — Telephone Encounter (Signed)
Notified pt sent meds to [facility]...[name] [name]

## 2014-06-28 NOTE — Telephone Encounter (Signed)
Patient is requesting scripts for gabapentin, furosemide, and ometrazole sent to pharmacy.  Pharmacy will not send request since Lenna Gilford wrote last scripts.

## 2014-06-29 ENCOUNTER — Ambulatory Visit (INDEPENDENT_AMBULATORY_CARE_PROVIDER_SITE_OTHER): Payer: Medicare Other | Admitting: Neurology

## 2014-06-29 ENCOUNTER — Encounter: Payer: Self-pay | Admitting: Neurology

## 2014-06-29 VITALS — BP 148/66 | HR 76 | Ht 61.0 in | Wt 128.0 lb

## 2014-06-29 DIAGNOSIS — E118 Type 2 diabetes mellitus with unspecified complications: Secondary | ICD-10-CM | POA: Diagnosis not present

## 2014-06-29 DIAGNOSIS — G629 Polyneuropathy, unspecified: Secondary | ICD-10-CM | POA: Diagnosis not present

## 2014-06-29 DIAGNOSIS — I2510 Atherosclerotic heart disease of native coronary artery without angina pectoris: Secondary | ICD-10-CM

## 2014-06-29 DIAGNOSIS — E1142 Type 2 diabetes mellitus with diabetic polyneuropathy: Secondary | ICD-10-CM

## 2014-06-29 DIAGNOSIS — E1342 Other specified diabetes mellitus with diabetic polyneuropathy: Secondary | ICD-10-CM

## 2014-06-29 LAB — FOLATE: Folate: >20 ng/mL

## 2014-06-29 LAB — RPR

## 2014-06-29 LAB — VITAMIN B12: VITAMIN B 12: 846 pg/mL (ref 211–911)

## 2014-06-29 NOTE — Progress Notes (Signed)
[name] [name] was seen today in neurologic consultation at the request of Dr. [name].  Her PCP is [name] Calico, MD.  The consultation is for the evaluation of gait changes and a few falls.  The records that were made available to me were reviewed.  Pt with husband who supplements the hx.  First fall was in august, 2015.  She missed the last step in the front of her house and injured her R knee and lacerated the 5th digit on the R hand.   She then had a fall in her kitchen.  Her daughter had just moved back in and had boxes on the floor.  She tried to step over the box and fell onto her floor fan and hurt her leg.  She states that she has had other near falls.  She will be walking down the hall and walk into the walls.  She wonders if it is related to her medications.  She is not lightheaded or dizzy.  She denies paresthesias of the feet.  She is on neurontin; states that she was on it after a neck surgery but she no longer has the pain but has just remained on it.  She was initially on lyrica but it was expensive and then it was changed to lyrica.  She is currently on 300 mg bid.    ALLERGIES:   Allergies  Allergen Reactions  . Niacin And Related Other (See Comments)    Must take "Flush-free"  . Codeine Nausea Only  . Prednisone Itching and Rash  . Sulfonamide Derivatives Itching  . Tetracycline Itching and Rash    CURRENT MEDICATIONS:  Outpatient Encounter Prescriptions as of 06/29/2014  Medication Sig  . amLODipine (NORVASC) 5 MG tablet TAKE ONE TABLET BY MOUTH ONCE DAILY  . Ascorbic Acid (VITAMIN C) 1000 MG tablet Take 1,000 mg by mouth daily.    Marland Kitchen aspirin EC 81 MG tablet Take 81 mg by mouth every morning.   . calcium carbonate (OS-CAL) 600 MG TABS Take 600 mg by mouth 2 (two) times daily with a meal.    . Cholecalciferol (VITAMIN D3) 1000 UNITS CAPS Take 1 capsule by mouth daily.    . Cinnamon 500 MG capsule Take 500 mg by mouth 2 (two) times daily.   . clopidogrel (PLAVIX) 75 MG  tablet Take 1 tablet (75 mg total) by mouth daily.  Marland Kitchen Cod Liver Oil 1000 MG CAPS Take 1 capsule by mouth 2 (two) times daily.    . Coenzyme Q10 (CO Q 10) 100 MG CAPS Take 1 capsule by mouth daily.    Marland Kitchen dicyclomine (BENTYL) 10 MG capsule Take 10 mg by mouth 3 (three) times daily before meals.  . Flaxseed, Linseed, 1000 MG CAPS Take 1 capsule by mouth 2 (two) times daily.   . furosemide (LASIX) 20 MG tablet Take 1 tablet (20 mg total) by mouth daily.  Marland Kitchen gabapentin (NEURONTIN) 300 MG capsule Take 1 capsule (300 mg total) by mouth 2 (two) times daily.  . Glucosamine-Chondroit-Vit C-Mn (GLUCOSAMINE CHONDR 1500 COMPLX PO) Take 1 capsule by mouth 2 (two) times daily.    Marland Kitchen losartan (COZAAR) 100 MG tablet Take 100 mg by mouth daily.  . metFORMIN (GLUCOPHAGE) 500 MG tablet Take 500 mg by mouth 2 (two) times daily with a meal.   . metoprolol (LOPRESSOR) 100 MG tablet TAKE ONE TABLET BY MOUTH TWICE DAILY  . Multiple Vitamin (STRESSTABS PO) Take 1 tablet by mouth at bedtime.    Marland Kitchen  niacin (NIASPAN) 500 MG CR tablet Take 1,000 mg by mouth at bedtime. Taking Flush Free only  . Omega-3 Fatty Acids (FISH OIL) 1000 MG CAPS Take 2 capsules by mouth 4 (four) times daily.   Marland Kitchen omeprazole (PRILOSEC) 20 MG capsule Take 2 capsules (40 mg total) by mouth daily.  . pravastatin (PRAVACHOL) 40 MG tablet Take 40 mg by mouth every evening.  . psyllium (REGULOID) 0.52 G capsule Take 0.52 g by mouth 2 (two) times daily.    . Red Yeast Rice 600 MG CAPS Take 1 capsule by mouth 2 (two) times daily.    . traMADol (ULTRAM) 50 MG tablet Take 1 tablet (50 mg total) by mouth every 6 (six) hours as needed.  . venlafaxine XR (EFFEXOR-XR) 37.5 MG 24 hr capsule Take 1 capsule (37.5 mg total) by mouth daily with breakfast.  . nitroGLYCERIN (NITROSTAT) 0.4 MG SL tablet Place 1 tablet (0.4 mg total) under the tongue every 5 (five) minutes as needed. For chest pain  . [DISCONTINUED] azithromycin (ZITHROMAX) 500 MG tablet Take 1 tablet (500 mg  total) by mouth daily.  . [DISCONTINUED] promethazine-dextromethorphan (PROMETHAZINE-DM) 6.25-15 MG/5ML syrup Take 5 mLs by mouth 4 (four) times daily as needed for cough.    PAST MEDICAL HISTORY:   Past Medical History  Diagnosis Date  . HTN (hypertension)   . CAD (coronary artery disease)     1.  inf MI 2007 - tx with DES to RCA;  2.  Echocardiogram 08/2007: EF 60%, mild MR, mild LAE.  3.  Last LHC 08/2009: dLM 20%, LAD beyond diagonal 60-70%, stable from prior catheterization in 2007, ostial circumflex 40-50%, mid RCA stent okay, EF 65%. ;  4.  myoview was recommended 08/2009: EF 76%, small inferoapical fixed defect, no ischemia  5.  Myoview 7/13: no scar or ischemia, EF 69%  . Premature ventricular contractions   . Cerebrovascular disease, unspecified   . Mixed hyperlipidemia   . DM type 2 (diabetes mellitus, type 2)   . GERD (gastroesophageal reflux disease)   . Diverticular disease   . IBS (irritable bowel syndrome)   . Urinary incontinence   . DJD (degenerative joint disease)   . Chronic neck pain   . Chronic pain syndrome   . Dizziness   . Anxiety   . 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 H/O hiatal hernia   . Nocturia   . Chronic kidney disease     frequent Kidney Infections  . Pancreatitis     1955 an once more  . PONV (postoperative nausea and vomiting)     Difficluty opening mouth wide and turning head. (Cervical Fusion)  . Mitral valve prolapse     PAST SURGICAL HISTORY:   Past Surgical History  Procedure Laterality Date  . Cervical spine surgery  07/2005    Dr Consuello Masse  . Bladder repair  2007  . Total abdominal hysterectomy    . Tonsillectomy    . Thumb surgery    . Neck surgery      has had 3 surgeries  . Temporomandibular joint surgery    . Left total knee replacement  11/2010    Dr Percell Miller  . I&d knee with poly exchange  09/25/2011    Procedure: IRRIGATION AND DEBRIDEMENT KNEE WITH POLY EXCHANGE;  Surgeon: Johnny Bridge, MD;  Location: [place];  Service: Orthopedics;  Laterality: Left;  . Cardiac catheterization  2007    Stents  . Appendectomy  1955  .  Tonsillectomy  1950  . Cholecystectomy  2007  . Eye surgery  2011    Bilteral  . Joint replacement  2012    left  . Incision and drainage abscess / hematoma of bursa / knee / thigh  09/2011  . Posterior cervical fusion/foraminotomy  04/08/2012    Procedure: POSTERIOR CERVICAL FUSION/FORAMINOTOMY LEVEL 2;  Surgeon: Otilio Connors, MD;  Location: [facility];  Service: Neurosurgery;  Laterality: Left;  Left Cervical six-seven Foraminotomy, bilateral cervical seven-thoracic one foraminotomy, cervical six-seven, cervical seven-thoracic one fusion with posterior instrumentation  . Dental surgery  05/2013    replaced an inplant    SOCIAL HISTORY:   History   Social History  . Marital Status: Married    Spouse Name: dolly Bernabe    Number of Children: N/A  . Years of Education: N/A   Occupational History  .     Social History Main Topics  . Smoking status: Never Smoker   . Smokeless tobacco: Never Used  . Alcohol Use: No  . Drug Use: No  . Sexual Activity: Yes   Other Topics Concern  . Not on file   Social History Narrative   Pt never knew her father   Daily caffeine use    FAMILY HISTORY:   Family Status  Relation Status Death Age  . Mother Deceased     heart disease, pancreatic cancer  . Father Deceased     does not know her father  . Brother Deceased     lung cancer  . Brother Alive     larynx cancer  . Sister Alive     frequent UTI, colon cancer    ROS:  A complete 10 system review of systems was obtained and was unremarkable apart from what is mentioned above.  PHYSICAL EXAMINATION:    VITALS:   Filed Vitals:   06/29/14 0908  BP: 148/66  Pulse: 76  Height: 5\' 1"  (1.549 m)  Weight: 128 lb (58.06 kg)    GEN:  Normal appears female in no acute distress.  Appears stated age. HEENT:  Normocephalic, atraumatic. The mucous membranes are moist.  The superficial temporal arteries are without ropiness or tenderness. Cardiovascular: Regular rate and rhythm. Lungs: Clear to auscultation bilaterally. Neck/Heme: There are no carotid bruits noted bilaterally.  NEUROLOGICAL: Orientation:  The patient is alert and oriented x 3.  Fund of knowledge is appropriate.  Recent and remote memory intact.  Attention span and concentration normal.  Repeats and names without difficulty. Cranial nerves: There is good facial symmetry. The pupils are equal round and reactive to light bilaterally. Fundoscopic exam reveals clear disc margins bilaterally. Extraocular muscles are intact and visual fields are full to confrontational testing. Speech is fluent and clear. Soft palate rises symmetrically and there is no tongue deviation. Hearing is intact to conversational tone. Tone: Tone is good throughout. Sensation: Sensation is intact to light touch and pinprick throughout (facial, trunk, extremities).  Pinprick is mildly decreased in a stocking distribution, and definitely decreased in a glove distribution.  Vibration is decreased at the bilateral big toe. There is no extinction with double simultaneous stimulation. There is no sensory dermatomal level identified. Coordination:  The patient has no difficulty with RAM's or FNF bilaterally. Motor: Strength is 5/5 in the bilateral upper and lower extremities.  Shoulder shrug is equal and symmetric. There is no pronator drift.  There are no fasciculations noted. DTR's: Deep tendon reflexes are 1/4 at the bilateral biceps, triceps, brachioradialis, patella and  absent at the bilateral achilles.  Plantar responses are downgoing bilaterally. Gait and Station: The patient is able to ambulate without difficulty.  The patient has significant difficulty attempting tandem in a tandem fashion.  She also cannot ambulate on her heels.  She is able to stand in the Romberg position with eyes open, but not with eyes closed.  LABS  Lab  Results  Component Value Date   HGBA1C 6.3 06/17/2014   No results found for this basename: VITAMINB12      IMPRESSION/PLAN  1. 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  -I had a long discussion with the patient today.  Greater than 50% of the 45 minute visit was spent in counseling.  We talked about safety.  Patient education was provided.  I will do other labs just to make sure that we are not missing other reversible causes of neuropathy.  -I see no evidence of a neurodegenerative state such as Parkinson's disease. 2.  history of cervical spine surgery.  -The patient was placed on Neurontin after her cervical spine surgery for neck pain.  However, she has been on this for years and no longer has neck pain.. It woud be my recommendation that this be tapered off, but I told her to talk with her primary care physician about this since she is no longer going to be following up here. 3.  she will followup here on an as needed basis.

## 2014-06-29 NOTE — Patient Instructions (Signed)
1. Your provider has requested that you have labwork completed today. Please go to [facility] on the first floor of this building before leaving the office today. We will call you with any abnormal results.

## 2014-07-01 LAB — SPEP & IFE WITH QIG
ALPHA-1-GLOBULIN: 4.1 % (ref 2.9–4.9)
ALPHA-2-GLOBULIN: 9.7 % (ref 7.1–11.8)
Albumin ELP: 58.4 % (ref 55.8–66.1)
Beta 2: 5.9 % (ref 3.2–6.5)
Beta Globulin: 6.5 % (ref 4.7–7.2)
GAMMA GLOBULIN: 15.4 % (ref 11.1–18.8)
IGG (IMMUNOGLOBIN G), SERUM: 1090 mg/dL (ref 690–1700)
IGM, SERUM: 82 mg/dL (ref 52–322)
IgA: 332 mg/dL (ref 69–380)
Total Protein, Serum Electrophoresis: 7 g/dL (ref 6.0–8.3)

## 2014-07-01 LAB — UIFE/LIGHT CHAINS/TP QN, 24-HR UR
ALPHA 1 UR: DETECTED — AB
ALPHA 2 UR: DETECTED — AB
Albumin, U: DETECTED
Beta, Urine: DETECTED — AB
Gamma Globulin, Urine: DETECTED — AB
Total Protein, Urine: 10 mg/dL (ref 5–24)

## 2014-07-08 ENCOUNTER — Other Ambulatory Visit (INDEPENDENT_AMBULATORY_CARE_PROVIDER_SITE_OTHER): Payer: Medicare Other

## 2014-07-08 ENCOUNTER — Ambulatory Visit (INDEPENDENT_AMBULATORY_CARE_PROVIDER_SITE_OTHER): Payer: Medicare Other | Admitting: Internal Medicine

## 2014-07-08 ENCOUNTER — Encounter: Payer: Self-pay | Admitting: Internal Medicine

## 2014-07-08 VITALS — BP 120/70 | HR 68 | Temp 98.3°F | Resp 16 | Ht 61.0 in | Wt 128.0 lb

## 2014-07-08 DIAGNOSIS — I2510 Atherosclerotic heart disease of native coronary artery without angina pectoris: Secondary | ICD-10-CM

## 2014-07-08 DIAGNOSIS — Z23 Encounter for immunization: Secondary | ICD-10-CM | POA: Diagnosis not present

## 2014-07-08 DIAGNOSIS — N390 Urinary tract infection, site not specified: Secondary | ICD-10-CM

## 2014-07-08 DIAGNOSIS — J302 Other seasonal allergic rhinitis: Secondary | ICD-10-CM | POA: Diagnosis not present

## 2014-07-08 DIAGNOSIS — R300 Dysuria: Secondary | ICD-10-CM | POA: Diagnosis not present

## 2014-07-08 DIAGNOSIS — B9689 Other specified bacterial agents as the cause of diseases classified elsewhere: Secondary | ICD-10-CM | POA: Insufficient documentation

## 2014-07-08 LAB — URINALYSIS, ROUTINE W REFLEX MICROSCOPIC
BILIRUBIN URINE: NEGATIVE
HGB URINE DIPSTICK: NEGATIVE
Ketones, ur: NEGATIVE
NITRITE: NEGATIVE
Specific Gravity, Urine: 1.01 (ref 1.000–1.030)
Total Protein, Urine: NEGATIVE
Urine Glucose: NEGATIVE
Urobilinogen, UA: 0.2 (ref 0.0–1.0)
pH: 6 (ref 5.0–8.0)

## 2014-07-08 MED ORDER — MOMETASONE FUROATE 50 MCG/ACT NA SUSP: 4.0000 | Freq: Every day | NASAL | Status: DC

## 2014-07-08 NOTE — Patient Instructions (Signed)

## 2014-07-08 NOTE — Assessment & Plan Note (Signed)
She has a longstanding history of mold allergy Will start nasonex

## 2014-07-08 NOTE — Progress Notes (Signed)
Pre visit review using our clinic review tool, if applicable. No additional management support is needed unless otherwise documented below in the visit note. 

## 2014-07-08 NOTE — Assessment & Plan Note (Signed)
I will check her UA and urine culture, will treat and/or evaluate further if needed

## 2014-07-08 NOTE — Progress Notes (Signed)
Subjective:    Patient ID: [name] [name], female    DOB: [birth year], 73 y.o.   MRN: [id number]  Dysuria  This is a new problem. The current episode started in the past 7 days. The problem occurs intermittently. The problem has been unchanged. The pain is at a severity of 0/10. The patient is experiencing no pain. There has been no fever. The fever has been present for less than 1 day. She is not sexually active. There is no history of pyelonephritis. Associated symptoms include frequency and nausea. Pertinent negatives include no chills, discharge, flank pain, hematuria, hesitancy, sweats, urgency or vomiting. She has tried nothing for the symptoms. Her past medical history is significant for recurrent UTIs. There is no history of catheterization, kidney stones, a single kidney, urinary stasis or a urological procedure.      Review of Systems  Constitutional: Negative.  Negative for fever, chills, diaphoresis, appetite change and fatigue.  HENT: Positive for congestion, postnasal drip and rhinorrhea. Negative for drooling, facial swelling, nosebleeds, sinus pressure, sneezing, sore throat, tinnitus, trouble swallowing and voice change.   Eyes: Negative.   Respiratory: Negative.   Cardiovascular: Negative.  Negative for chest pain, palpitations and leg swelling.  Gastrointestinal: Positive for nausea. Negative for vomiting, abdominal pain, diarrhea and constipation.  Endocrine: Negative.   Genitourinary: Positive for dysuria and frequency. Negative for hesitancy, urgency, hematuria, flank pain, decreased urine volume, vaginal bleeding, difficulty urinating and dyspareunia.  Musculoskeletal: Negative.   Skin: Negative.   Allergic/Immunologic: Negative.   Neurological: Negative.   Hematological: Negative.  Negative for adenopathy. Does not bruise/bleed easily.  Psychiatric/Behavioral: Negative.        Objective:   Physical Exam  Constitutional: She is oriented to person, place, and  time. She appears well-developed and well-nourished.  Non-toxic appearance. She does not have a sickly appearance. She does not appear ill. No distress.  HENT:  Right Ear: Hearing, tympanic membrane, external ear and ear canal normal.  Left Ear: Hearing, tympanic membrane, external ear and ear canal normal.  Nose: Mucosal edema and rhinorrhea present. No nose lacerations, sinus tenderness, nasal deformity, septal deviation or nasal septal hematoma. No epistaxis.  No foreign bodies. Right sinus exhibits no maxillary sinus tenderness and no frontal sinus tenderness. Left sinus exhibits no maxillary sinus tenderness and no frontal sinus tenderness.  Mouth/Throat: Oropharynx is clear and moist and mucous membranes are normal. Mucous membranes are not pale, not dry and not cyanotic. No oral lesions. No trismus in the jaw. No uvula swelling. No oropharyngeal exudate, posterior oropharyngeal edema, posterior oropharyngeal erythema or tonsillar abscesses.  Eyes: Conjunctivae are normal. Right eye exhibits no discharge. Left eye exhibits no discharge. No scleral icterus.  Neck: Normal range of motion. Neck supple. No JVD present. No tracheal deviation present. No thyromegaly present.  Cardiovascular: Normal rate, regular rhythm, normal heart sounds and intact distal pulses.  Exam reveals no gallop and no friction rub.   No murmur heard. Pulmonary/Chest: Effort normal and breath sounds normal. No stridor. No respiratory distress. She has no wheezes. She has no rales. She exhibits no tenderness.  Abdominal: Soft. Bowel sounds are normal. She exhibits no distension and no mass. There is no tenderness. There is no rebound and no guarding.  Musculoskeletal: Normal range of motion. She exhibits no edema or tenderness.  Lymphadenopathy:    She has no cervical adenopathy.  Neurological: She is oriented to person, place, and time.  Skin: Skin is warm and dry. No rash noted. She  is not diaphoretic. No erythema. No  pallor.  Psychiatric: She has a normal mood and affect. Her behavior is normal. Judgment and thought content normal.  Vitals reviewed.     Lab Results  Component Value Date   WBC 12.7* 06/17/2014   HGB 14.6 06/17/2014   HCT 45.3 06/17/2014   PLT 324.0 06/17/2014   GLUCOSE 129* 06/17/2014   CHOL 160 01/20/2014   TRIG 138.0 01/20/2014   HDL 47.10 01/20/2014   LDLDIRECT 74.6 04/10/2011   LDLCALC 85 01/20/2014   ALT 19 06/17/2014   AST 22 06/17/2014   NA 137 06/17/2014   K 4.2 06/17/2014   CL 94* 06/17/2014   CREATININE 1.0 06/17/2014   BUN 8 06/17/2014   CO2 34* 06/17/2014   TSH 2.44 06/17/2014   INR 1.54* 09/28/2011   HGBA1C 6.3 06/17/2014      Assessment & Plan:

## 2014-07-10 LAB — CULTURE, URINE COMPREHENSIVE: Colony Count: >=100000

## 2014-07-11 ENCOUNTER — Other Ambulatory Visit: Payer: Self-pay | Admitting: Internal Medicine

## 2014-07-11 DIAGNOSIS — B9689 Other specified bacterial agents as the cause of diseases classified elsewhere: Secondary | ICD-10-CM

## 2014-07-11 DIAGNOSIS — N390 Urinary tract infection, site not specified: Principal | ICD-10-CM

## 2014-07-11 MED ORDER — CIPROFLOXACIN HCL 250 MG PO TABS: 250.0000 mg | ORAL_TABLET | Freq: Two times a day (BID) | ORAL | Status: DC

## 2014-07-22 ENCOUNTER — Encounter: Payer: Self-pay | Admitting: Neurology

## 2014-08-20 ENCOUNTER — Other Ambulatory Visit: Payer: Self-pay | Admitting: Cardiovascular Disease

## 2014-11-08 ENCOUNTER — Other Ambulatory Visit (INDEPENDENT_AMBULATORY_CARE_PROVIDER_SITE_OTHER): Payer: PPO

## 2014-11-08 ENCOUNTER — Encounter: Payer: Self-pay | Admitting: Internal Medicine

## 2014-11-08 ENCOUNTER — Ambulatory Visit (INDEPENDENT_AMBULATORY_CARE_PROVIDER_SITE_OTHER): Payer: PPO | Admitting: Internal Medicine

## 2014-11-08 VITALS — BP 118/78 | HR 96 | Temp 98.7°F | Resp 16 | Ht 61.0 in | Wt 131.0 lb

## 2014-11-08 DIAGNOSIS — E118 Type 2 diabetes mellitus with unspecified complications: Secondary | ICD-10-CM

## 2014-11-08 DIAGNOSIS — G44219 Episodic tension-type headache, not intractable: Secondary | ICD-10-CM

## 2014-11-08 DIAGNOSIS — E785 Hyperlipidemia, unspecified: Secondary | ICD-10-CM

## 2014-11-08 DIAGNOSIS — I10 Essential (primary) hypertension: Secondary | ICD-10-CM

## 2014-11-08 DIAGNOSIS — Z23 Encounter for immunization: Secondary | ICD-10-CM

## 2014-11-08 LAB — COMPREHENSIVE METABOLIC PANEL
ALBUMIN: 4.1 g/dL (ref 3.5–5.2)
ALK PHOS: 72 U/L (ref 39–117)
ALT: 30 U/L (ref 0–35)
AST: 18 U/L (ref 0–37)
BILIRUBIN TOTAL: 0.4 mg/dL (ref 0.2–1.2)
BUN: 16 mg/dL (ref 6–23)
CHLORIDE: 98 meq/L (ref 96–112)
CO2: 35 meq/L — AB (ref 19–32)
Calcium: 9.2 mg/dL (ref 8.4–10.5)
Creatinine, Ser: 0.83 mg/dL (ref 0.40–1.20)
GFR: 71.22 mL/min (ref >60.00)
GLUCOSE: 156 mg/dL — AB (ref 70–99)
Potassium: 4.1 mEq/L (ref 3.5–5.1)
Sodium: 138 mEq/L (ref 135–145)
TOTAL PROTEIN: 7 g/dL (ref 6.0–8.3)

## 2014-11-08 LAB — URINALYSIS, ROUTINE W REFLEX MICROSCOPIC
BILIRUBIN URINE: NEGATIVE
Hgb urine dipstick: NEGATIVE
Ketones, ur: NEGATIVE
Nitrite: POSITIVE — AB
PH: 6 (ref 5.0–8.0)
RBC / HPF: NONE SEEN (ref 0–?)
Specific Gravity, Urine: 1.01 (ref 1.000–1.030)
TOTAL PROTEIN, URINE-UPE24: NEGATIVE
URINE GLUCOSE: NEGATIVE
Urobilinogen, UA: 0.2 (ref 0.0–1.0)

## 2014-11-08 LAB — CBC WITH DIFFERENTIAL/PLATELET
Basophils Absolute: 0.1 10*3/uL (ref 0.0–0.1)
Basophils Relative: 0.9 % (ref 0.0–3.0)
EOS ABS: 0.2 10*3/uL (ref 0.0–0.7)
EOS PCT: 2.8 % (ref 0.0–5.0)
HCT: 41.5 % (ref 36.0–46.0)
Hemoglobin: 14 g/dL (ref 12.0–15.0)
Lymphocytes Relative: 35 % (ref 12.0–46.0)
Lymphs Abs: 2.7 10*3/uL (ref 0.7–4.0)
MCHC: 33.8 g/dL (ref 30.0–36.0)
MCV: 92.8 fl (ref 78.0–100.0)
MONO ABS: 0.6 10*3/uL (ref 0.1–1.0)
Monocytes Relative: 7.5 % (ref 3.0–12.0)
Neutro Abs: 4.2 10*3/uL (ref 1.4–7.7)
Neutrophils Relative %: 53.8 % (ref 43.0–77.0)
PLATELETS: 297 10*3/uL (ref 150.0–400.0)
RBC: 4.47 Mil/uL (ref 3.87–5.11)
RDW: 13.4 % (ref 11.5–15.5)
WBC: 7.8 10*3/uL (ref 4.0–10.5)

## 2014-11-08 LAB — SEDIMENTATION RATE: SED RATE: 9 mm/h (ref 0–22)

## 2014-11-08 LAB — MICROALBUMIN / CREATININE URINE RATIO
CREATININE, U: 87.2 mg/dL
MICROALB UR: 1.2 mg/dL (ref 0.0–1.9)
Microalb Creat Ratio: 1.4 mg/g (ref 0.0–30.0)

## 2014-11-08 LAB — HEMOGLOBIN A1C: HEMOGLOBIN A1C: 6.5 % (ref 4.6–6.5)

## 2014-11-08 MED ORDER — TIZANIDINE HCL 2 MG PO CAPS: 2.0000 mg | ORAL_CAPSULE | Freq: Three times a day (TID) | ORAL | Status: DC

## 2014-11-08 NOTE — Patient Instructions (Signed)
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
## 2014-11-08 NOTE — Assessment & Plan Note (Signed)
Her BP is well controlled Lytes and renal function are stable 

## 2014-11-08 NOTE — Assessment & Plan Note (Signed)
She has achieved her LDL goal Will cont pravachol

## 2014-11-08 NOTE — Assessment & Plan Note (Signed)
Her blood sugars are well controlled 

## 2014-11-08 NOTE — Progress Notes (Signed)
Pre visit review using our clinic review tool, if applicable. No additional management support is needed unless otherwise documented below in the visit note. 

## 2014-11-08 NOTE — Assessment & Plan Note (Signed)
I do not see any alarm signs that warrant an image of the brain Her ESR and other labs are WNL Will cont tramadol and will try a muscle relaxer to relieve the pain

## 2014-11-09 ENCOUNTER — Ambulatory Visit (INDEPENDENT_AMBULATORY_CARE_PROVIDER_SITE_OTHER): Payer: PPO | Admitting: Cardiovascular Disease

## 2014-11-09 ENCOUNTER — Encounter: Payer: Self-pay | Admitting: Cardiovascular Disease

## 2014-11-09 VITALS — BP 138/62 | HR 65 | Ht 61.0 in | Wt 131.0 lb

## 2014-11-09 DIAGNOSIS — I2510 Atherosclerotic heart disease of native coronary artery without angina pectoris: Secondary | ICD-10-CM

## 2014-11-09 DIAGNOSIS — I10 Essential (primary) hypertension: Secondary | ICD-10-CM

## 2014-11-09 MED ORDER — NITROGLYCERIN 0.4 MG SL SUBL: 0.4000 mg | SUBLINGUAL_TABLET | SUBLINGUAL | Status: DC | PRN

## 2014-11-09 NOTE — Progress Notes (Signed)
Cardiology Office Note   Date:  11/09/2014   ID:  [name] [name], DOB 12-18-1940, MRN [id number]  PCP:  [name] Calico, MD  Cardiologist:  Sherren Mocha, MD    No chief complaint on file.    History of Present Illness: [name] [name] is a 74 y.o. female who presents for follow-up of CAD and old MI.  The patient has a history of coronary artery disease with initial presentation in 2007 when she had an inferior wall STEMI. She was treated with primary PCI using a drug-eluting stent in the right coronary artery. LV function has been within normal limits. She underwent repeat heart catheterization in 2010 which showed moderate LAD stenosis stable from her previous study, patency of her RCA stent site, and a left ventricular ejection fraction of 65%. A followup Myoview scan in 2013 was done and showed no ischemia.  The patient has had a few episodes of heaviness in the chest or in the neck. Symptoms are unrelated to physical exertion and they are fleeting in nature. She exercises without exertional symptoms. Reports shortness of breath but no change/progression over the past few years. No edema, orthopnea, or PND. No lightheadedness or syncope. Overall she feels pretty well.  Past Medical History  Diagnosis Date  . HTN (hypertension)   . CAD (coronary artery disease)     1.  inf MI 2007 - tx with DES to RCA;  2.  Echocardiogram 08/2007: EF 60%, mild MR, mild LAE.  3.  Last LHC 08/2009: dLM 20%, LAD beyond diagonal 60-70%, stable from prior catheterization in 2007, ostial circumflex 40-50%, mid RCA stent okay, EF 65%. ;  4.  myoview was recommended 08/2009: EF 76%, small inferoapical fixed defect, no ischemia  5.  Myoview 7/13: no scar or ischemia, EF 69%  . Premature ventricular contractions   . Cerebrovascular disease, unspecified   . Mixed hyperlipidemia   . DM type 2 (diabetes mellitus, type 2)   . GERD (gastroesophageal reflux disease)   . Diverticular disease   . IBS (irritable bowel  syndrome)   . Urinary incontinence   . DJD (degenerative joint disease)   . Chronic neck pain   . Chronic pain syndrome   . Dizziness   . Anxiety   . 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 H/O hiatal hernia   . Nocturia   . Chronic kidney disease     frequent Kidney Infections  . Pancreatitis     1955 an once more  . PONV (postoperative nausea and vomiting)     Difficluty opening mouth wide and turning head. (Cervical Fusion)  . Mitral valve prolapse     Past Surgical History  Procedure Laterality Date  . Cervical spine surgery  07/2005    Dr Consuello Masse  . Bladder repair  2007  . Total abdominal hysterectomy    . Tonsillectomy    . Thumb surgery    . Neck surgery      has had 3 surgeries  . Temporomandibular joint surgery    . Left total knee replacement  11/2010    Dr Percell Miller  . I&d knee with poly exchange  09/25/2011    Procedure: IRRIGATION AND DEBRIDEMENT KNEE WITH POLY EXCHANGE;  Surgeon: Johnny Bridge, MD;  Location: [facility];  Service: Orthopedics;  Laterality: Left;  . Cardiac catheterization  2007    Stents  . Appendectomy  1955  . Tonsillectomy  1950  . Cholecystectomy  2007  . Eye surgery  2011    Bilteral  . Joint replacement  2012    left  . Incision and drainage abscess / hematoma of bursa / knee / thigh  09/2011  . Posterior cervical fusion/foraminotomy  04/08/2012    Procedure: POSTERIOR CERVICAL FUSION/FORAMINOTOMY LEVEL 2;  Surgeon: Otilio Connors, MD;  Location: [facility];  Service: Neurosurgery;  Laterality: Left;  Left Cervical six-seven Foraminotomy, bilateral cervical seven-thoracic one foraminotomy, cervical six-seven, cervical seven-thoracic one fusion with posterior instrumentation  . Dental surgery  05/2013    replaced an inplant    Current Outpatient Prescriptions  Medication Sig Dispense Refill  . Ascorbic Acid (VITAMIN C) 1000 MG tablet Take 1,000 mg by mouth daily.      Marland Kitchen aspirin EC 81 MG tablet Take 81 mg by mouth every  morning.     . calcium carbonate (OS-CAL) 600 MG TABS Take 600 mg by mouth 2 (two) times daily with a meal.      . Cholecalciferol (VITAMIN D3) 1000 UNITS CAPS Take 1 capsule by mouth daily.      . Cinnamon 500 MG capsule Take 500 mg by mouth 2 (two) times daily.     Marland Kitchen Cod Liver Oil 1000 MG CAPS Take 1 capsule by mouth 2 (two) times daily.      . Coenzyme Q10 (CO Q 10) 100 MG CAPS Take 1 capsule by mouth daily.      . Flaxseed, Linseed, 1000 MG CAPS Take 1 capsule by mouth 2 (two) times daily.     . Glucosamine-Chondroit-Vit C-Mn (GLUCOSAMINE CHONDR 1500 COMPLX PO) Take 1 capsule by mouth 2 (two) times daily.      . Multiple Vitamin (STRESSTABS PO) Take 1 tablet by mouth at bedtime.      . niacin (NIASPAN) 500 MG CR tablet Take 1,000 mg by mouth at bedtime. Taking Flush Free only    . Omega-3 Fatty Acids (FISH OIL) 1000 MG CAPS Take 2 capsules by mouth 4 (four) times daily.     . psyllium (REGULOID) 0.52 G capsule Take 0.52 g by mouth 2 (two) times daily.      . Red Yeast Rice 600 MG CAPS Take 1 capsule by mouth 2 (two) times daily.      Marland Kitchen amLODipine (NORVASC) 5 MG tablet TAKE ONE TABLET BY MOUTH ONCE DAILY 30 tablet 6  . clopidogrel (PLAVIX) 75 MG tablet TAKE ONE TABLET BY MOUTH ONCE DAILY 30 tablet 5  . dicyclomine (BENTYL) 10 MG capsule Take 10 mg by mouth 3 (three) times daily before meals.    . furosemide (LASIX) 20 MG tablet Take 1 tablet (20 mg total) by mouth daily. 90 tablet 3  . gabapentin (NEURONTIN) 300 MG capsule Take 1 capsule (300 mg total) by mouth 2 (two) times daily. 180 capsule 3  . losartan (COZAAR) 100 MG tablet Take 100 mg by mouth daily.    . metFORMIN (GLUCOPHAGE) 500 MG tablet Take 500 mg by mouth 2 (two) times daily with a meal.     . metoprolol (LOPRESSOR) 100 MG tablet TAKE ONE TABLET BY MOUTH TWICE DAILY 60 tablet 6  . mometasone (NASONEX) 50 MCG/ACT nasal spray Place 4 sprays into the nose daily. 17 g 12  . nitroGLYCERIN (NITROSTAT) 0.4 MG SL tablet Place 1 tablet  (0.4 mg total) under the tongue every 5 (five) minutes as needed. For chest pain 25 tablet 2  . omeprazole (PRILOSEC) 20 MG capsule Take 2 capsules (40 mg total) by mouth daily. [place]  capsule 3  . pravastatin (PRAVACHOL) 40 MG tablet Take 40 mg by mouth every evening.    . tizanidine (ZANAFLEX) 2 MG capsule Take 1 capsule (2 mg total) by mouth 3 (three) times daily. 90 capsule 3  . traMADol (ULTRAM) 50 MG tablet Take 1 tablet (50 mg total) by mouth every 6 (six) hours as needed. 15 tablet 0  . venlafaxine XR (EFFEXOR-XR) 37.5 MG 24 hr capsule Take 1 capsule (37.5 mg total) by mouth daily with breakfast. 30 capsule 11  . [DISCONTINUED] enoxaparin (LOVENOX) 40 MG/0.4ML SOLN Inject 0.4 mLs (40 mg total) into the skin daily. 5 Syringe 0   No current facility-administered medications for this visit.    Allergies:   Niacin and related; Codeine; Prednisone; Sulfonamide derivatives; and Tetracycline   Social History:  The patient  reports that she has never smoked. She has never used smokeless tobacco. She reports that she does not drink alcohol or use illicit drugs.   Family History:  The patient's  family history includes Coronary artery disease in her other and other; Heart disease in her mother.    ROS:  Please see the history of present illness.  Otherwise, review of systems is positive for muscle pain, easy bruising, heart palpitations, headaches.  All other systems are reviewed and negative.    PHYSICAL EXAM: VS:  BP 138/62 mmHg  Pulse 65  Ht 5\' 1"  (1.549 m)  Wt 131 lb (59.421 kg)  BMI 24.76 kg/m2 , BMI Body mass index is 24.76 kg/(m^2). GEN: Well nourished, well developed, in no acute distress HEENT: normal Neck: no JVD, no masses, no carotid bruits Cardiac: RRR without murmur or gallop                Respiratory:  clear to auscultation bilaterally, normal work of breathing GI: soft, nontender, nondistended, + BS MS: no deformity or atrophy Ext: no pretibial edema Skin: warm and dry,  no rash Neuro:  Strength and sensation are intact Psych: euthymic mood, full affect  EKG:  EKG is ordered today. The ekg ordered today shows NSR 65 bpm, possible age-indeterminate anterior infarct, otherwise within normal limits  Recent Labs: 06/17/2014: TSH 2.44 11/08/2014: ALT 30; BUN 16; Creatinine 0.83; Hemoglobin 14.0; Platelets 297.0; Potassium 4.1; Sodium 138   Lipid Panel     Component Value Date/Time   CHOL 160 01/20/2014 1403   TRIG 138.0 01/20/2014 1403   TRIG 164* 06/24/2006 0738   HDL 47.10 01/20/2014 1403   CHOLHDL 3 01/20/2014 1403   CHOLHDL 3.8 CALC 06/24/2006 0738   VLDL 27.6 01/20/2014 1403   LDLCALC 85 01/20/2014 1403   LDLDIRECT 74.6 04/10/2011 1129      Wt Readings from Last 3 Encounters:  11/09/14 131 lb (59.421 kg)  11/08/14 131 lb (59.421 kg)  07/08/14 128 lb (58.06 kg)     ASSESSMENT AND PLAN: 1.  CAD, native vessel, with atypical symptoms of angina. The patient appears to be stable. She continues on long-term dual antiplatelet therapy. I think this is a good approach considering her treatment with the first generation drug-eluting stent for acute myocardial infarction. No changes were made to her medical program today.  2. Essential hypertension, controlled: Continue current medications which were reviewed today.  3. Hyperlipidemia: She continues on niacin and pravastatin. Last lipids were reviewed.   Current medicines are reviewed with the patient today.  The patient does not have concerns regarding medicines.  The following changes have been made:  no change  Labs/ tests  ordered today include:   Orders Placed This Encounter  Procedures  . EKG 12-Lead    Disposition:   FU one year  Signed, Sherren Mocha, MD  11/09/2014 5:37 PM    [address], [place], [place]  [address] Phone: [phone]; Fax: [phone]

## 2014-11-09 NOTE — Patient Instructions (Signed)
Your physician recommends that you continue on your current medications as directed. Please refer to the Current Medication list given to you today.  Your physician wants you to follow-up in: 1 YEAR with Dr Cooper.  You will receive a reminder letter in the mail two months in advance. If you don't receive a letter, please call our office to schedule the follow-up appointment.  

## 2014-11-10 ENCOUNTER — Telehealth: Payer: Self-pay | Admitting: Internal Medicine

## 2014-11-10 NOTE — Telephone Encounter (Signed)
emmi emailed °

## 2014-12-13 ENCOUNTER — Other Ambulatory Visit: Payer: Self-pay | Admitting: Cardiovascular Disease

## 2014-12-29 ENCOUNTER — Telehealth: Payer: Self-pay

## 2014-12-29 NOTE — Telephone Encounter (Signed)
Eye exam scheduled for June.    Reschedule appt for 03/10/15 for AWV.

## 2015-01-04 ENCOUNTER — Other Ambulatory Visit: Payer: Self-pay | Admitting: Cardiovascular Disease

## 2015-02-01 ENCOUNTER — Other Ambulatory Visit: Payer: Self-pay | Admitting: Internal Medicine

## 2015-02-23 ENCOUNTER — Other Ambulatory Visit: Payer: Self-pay | Admitting: Cardiovascular Disease

## 2015-03-10 ENCOUNTER — Other Ambulatory Visit (INDEPENDENT_AMBULATORY_CARE_PROVIDER_SITE_OTHER): Payer: PPO

## 2015-03-10 ENCOUNTER — Encounter: Payer: Self-pay | Admitting: Internal Medicine

## 2015-03-10 ENCOUNTER — Ambulatory Visit (INDEPENDENT_AMBULATORY_CARE_PROVIDER_SITE_OTHER): Payer: PPO | Admitting: Internal Medicine

## 2015-03-10 VITALS — BP 140/80 | HR 70 | Temp 98.5°F | Resp 16 | Ht 61.0 in | Wt 130.8 lb

## 2015-03-10 DIAGNOSIS — E785 Hyperlipidemia, unspecified: Secondary | ICD-10-CM

## 2015-03-10 DIAGNOSIS — I2510 Atherosclerotic heart disease of native coronary artery without angina pectoris: Secondary | ICD-10-CM

## 2015-03-10 DIAGNOSIS — E118 Type 2 diabetes mellitus with unspecified complications: Secondary | ICD-10-CM

## 2015-03-10 DIAGNOSIS — R300 Dysuria: Secondary | ICD-10-CM

## 2015-03-10 DIAGNOSIS — Z0000 Encounter for general adult medical examination without abnormal findings: Secondary | ICD-10-CM

## 2015-03-10 DIAGNOSIS — I10 Essential (primary) hypertension: Secondary | ICD-10-CM

## 2015-03-10 LAB — COMPREHENSIVE METABOLIC PANEL
ALT: 27 U/L (ref 0–35)
AST: 20 U/L (ref 0–37)
Albumin: 4 g/dL (ref 3.5–5.2)
Alkaline Phosphatase: 70 U/L (ref 39–117)
BILIRUBIN TOTAL: 0.4 mg/dL (ref 0.2–1.2)
BUN: 12 mg/dL (ref 6–23)
CALCIUM: 9.6 mg/dL (ref 8.4–10.5)
CO2: 33 mEq/L — ABNORMAL HIGH (ref 19–32)
CREATININE: 0.81 mg/dL (ref 0.40–1.20)
Chloride: 97 mEq/L (ref 96–112)
GFR: 73.19 mL/min (ref >60.00)
Glucose, Bld: 135 mg/dL — ABNORMAL HIGH (ref 70–99)
Potassium: 4.7 mEq/L (ref 3.5–5.1)
SODIUM: 136 meq/L (ref 135–145)
Total Protein: 7.2 g/dL (ref 6.0–8.3)

## 2015-03-10 LAB — CBC WITH DIFFERENTIAL/PLATELET
BASOS ABS: 0.1 10*3/uL (ref 0.0–0.1)
Basophils Relative: 0.6 % (ref 0.0–3.0)
Eosinophils Absolute: 0.2 10*3/uL (ref 0.0–0.7)
Eosinophils Relative: 2.2 % (ref 0.0–5.0)
HCT: 41.4 % (ref 36.0–46.0)
Hemoglobin: 14 g/dL (ref 12.0–15.0)
LYMPHS PCT: 38 % (ref 12.0–46.0)
Lymphs Abs: 3.5 10*3/uL (ref 0.7–4.0)
MCHC: 33.9 g/dL (ref 30.0–36.0)
MCV: 92.5 fl (ref 78.0–100.0)
MONOS PCT: 8.3 % (ref 3.0–12.0)
Monocytes Absolute: 0.8 10*3/uL (ref 0.1–1.0)
Neutro Abs: 4.7 10*3/uL (ref 1.4–7.7)
Neutrophils Relative %: 50.9 % (ref 43.0–77.0)
Platelets: 338 10*3/uL (ref 150.0–400.0)
RBC: 4.48 Mil/uL (ref 3.87–5.11)
RDW: 13.5 % (ref 11.5–15.5)
WBC: 9.3 10*3/uL (ref 4.0–10.5)

## 2015-03-10 LAB — LIPID PANEL
Cholesterol: 149 mg/dL (ref 0–200)
HDL: 41.2 mg/dL (ref >39.00)
NONHDL: 107.8
TRIGLYCERIDES: 225 mg/dL — AB (ref 0.0–149.0)
Total CHOL/HDL Ratio: 4
VLDL: 45 mg/dL — ABNORMAL HIGH (ref 0.0–40.0)

## 2015-03-10 LAB — HEMOGLOBIN A1C: Hgb A1c MFr Bld: 6.4 % (ref 4.6–6.5)

## 2015-03-10 LAB — POCT URINALYSIS DIPSTICK
BILIRUBIN UA: NEGATIVE
Glucose, UA: NEGATIVE
Ketones, UA: NEGATIVE
Leukocytes, UA: NEGATIVE
Nitrite, UA: NEGATIVE
Protein, UA: NEGATIVE
RBC UA: NEGATIVE
Spec Grav, UA: 1.015
Urobilinogen, UA: 0.2
pH, UA: 6

## 2015-03-10 LAB — LDL CHOLESTEROL, DIRECT: Direct LDL: 68 mg/dL

## 2015-03-10 LAB — TSH: TSH: 1.76 u[IU]/mL (ref 0.35–4.50)

## 2015-03-10 NOTE — Progress Notes (Signed)
Pre visit review using our clinic review tool, if applicable. No additional management support is needed unless otherwise documented below in the visit note. 

## 2015-03-10 NOTE — Progress Notes (Signed)
Subjective:   [name] [name] is a 74 y.o. female who presents for Medicare Annual (Subsequent) preventive examination.  Review of Systems:   HRA assessment completed during visit;  Patient is here for Annual Wellness Assessment   Personalized education given regarding risk on the problem list that are currently being medically managed;  Lifestyle changes reviewed:  Describes health has good;   Discusses; caregiving role as spouse has memory issues x several years. 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   SAFETY Is removing carpet from home to make it safer and cleaner Reviewed safety list for older adults for changes; has removed throw rugs;    Fall prevention; had 2 falls; Friend putting up rails outside home. Placing tape as well on bottom;  Urinary or fecal incontinence reviewed On Monday; felt she was getting a UTI with some lower back pain; bladder pain; will discuss with doctor  Educated on prevention falls; Exercise, toning and strengthening;  Home/ safety; removal of throw rugs; bathroom handrails; Unlevel surfaces outside; Smoke detectors;sun protection; seatbelts;    Screenings overdue Ophthalmology exam; Had an eye exam scheduled; but cancelled but will reschedule next week; Immunizations; completed Shingles: completed Colonoscopy; 2014; had polyps; requested she have colonoscopy q 3 years; next 2017 EKG March of 2016 Dexa scan; deferred to Dr. Newman Nip  Mammogram BSW [id number];   Hearing: 4000hz  both ears Dental: had implants put in; now they are coming loose; Waiting to get these fixed;  Care Team updated in [employer];   Reviewed history and social history         Objective:     Vitals: BP 140/80 mmHg  Pulse 70   Temp(Src) 98.5 F (36.9 C) (Oral)  Ht 5\' 1"  (1.549 m)  Wt 130 lb 12 oz (59.308 kg)  BMI 24.72 kg/m2  SpO2 97%  Tobacco History  Smoking status  . Never Smoker   Smokeless tobacco  . Never Used     Counseling given: Not Answered   Past Medical History  Diagnosis Date  . HTN (hypertension)   . CAD (coronary artery disease)     1.  inf MI 2007 - tx with DES to RCA;  2.  Echocardiogram 08/2007: EF 60%, mild MR, mild LAE.  3.  Last LHC 08/2009: dLM 20%, LAD beyond diagonal 60-70%, stable from prior catheterization in 2007, ostial circumflex 40-50%, mid RCA stent okay, EF 65%. ;  4.  myoview was recommended 08/2009: EF 76%, small inferoapical fixed defect, no ischemia  5.  Myoview 7/13: no scar or ischemia, EF 69%  . Premature ventricular contractions   . Cerebrovascular disease, unspecified   . Mixed hyperlipidemia   . DM type 2 (diabetes mellitus, type 2)   . GERD (gastroesophageal reflux disease)   . Diverticular disease   . IBS (irritable bowel syndrome)   . Urinary incontinence   . DJD (degenerative joint disease)   . Chronic neck pain   . Chronic pain syndrome   . Dizziness   . Anxiety   . 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 H/O hiatal hernia   . Nocturia   . Chronic kidney disease     frequent Kidney Infections  . Pancreatitis  1955 an once more  . PONV (postoperative nausea and vomiting)     Difficluty opening mouth wide and turning head. (Cervical Fusion)  . Mitral valve prolapse    Past Surgical History  Procedure Laterality Date  . Cervical spine surgery  07/2005    Dr Consuello Masse  . Bladder repair  2007  . Total abdominal hysterectomy    . Tonsillectomy    . Thumb surgery    . Neck surgery      has had 3 surgeries  . Temporomandibular joint surgery    . Left total knee replacement  11/2010    Dr Percell Miller  . I&d knee with poly exchange  09/25/2011    Procedure: IRRIGATION AND DEBRIDEMENT KNEE WITH POLY EXCHANGE;  Surgeon: Johnny Bridge,  MD;  Location: [facility];  Service: Orthopedics;  Laterality: Left;  . Cardiac catheterization  2007    Stents  . Appendectomy  1955  . Tonsillectomy  1950  . Cholecystectomy  2007  . Eye surgery  2011    Bilteral  . Joint replacement  2012    left  . Incision and drainage abscess / hematoma of bursa / knee / thigh  09/2011  . Posterior cervical fusion/foraminotomy  04/08/2012    Procedure: POSTERIOR CERVICAL FUSION/FORAMINOTOMY LEVEL 2;  Surgeon: Otilio Connors, MD;  Location: [facility];  Service: Neurosurgery;  Laterality: Left;  Left Cervical six-seven Foraminotomy, bilateral cervical seven-thoracic one foraminotomy, cervical six-seven, cervical seven-thoracic one fusion with posterior instrumentation  . Dental surgery  05/2013    replaced an inplant   Family History  Problem Relation Age of Onset  . Heart disease Mother   . Coronary artery disease Other     sibling  . Coronary artery disease Other     sibling   History  Sexual Activity  . Sexual Activity: Yes    Outpatient Encounter Prescriptions as of 03/10/2015  Medication Sig  . amLODipine (NORVASC) 5 MG tablet TAKE ONE TABLET BY MOUTH ONCE DAILY  . Ascorbic Acid (VITAMIN C) 1000 MG tablet Take 1,000 mg by mouth daily.    Marland Kitchen aspirin EC 81 MG tablet Take 81 mg by mouth every morning.   . calcium carbonate (OS-CAL) 600 MG TABS Take 600 mg by mouth 2 (two) times daily with a meal.    . Cholecalciferol (VITAMIN D3) 1000 UNITS CAPS Take 1 capsule by mouth daily.    . Cinnamon 500 MG capsule Take 500 mg by mouth 2 (two) times daily.   . clopidogrel (PLAVIX) 75 MG tablet TAKE ONE TABLET BY MOUTH ONCE DAILY.  Marland Kitchen Cod Liver Oil 1000 MG CAPS Take 1 capsule by mouth 2 (two) times daily.    . Coenzyme Q10 (CO Q 10) 100 MG CAPS Take 1 capsule by mouth daily.    Marland Kitchen dicyclomine (BENTYL) 10 MG capsule Take 10 mg by mouth 3 (three) times daily before meals.  . dicyclomine (BENTYL) 10 MG capsule TAKE ONE CAPSULE BY MOUTH THREE TIMES DAILY  .  Flaxseed, Linseed, 1000 MG CAPS Take 1 capsule by mouth 2 (two) times daily.   . furosemide (LASIX) 20 MG tablet Take 1 tablet (20 mg total) by mouth daily.  Marland Kitchen gabapentin (NEURONTIN) 300 MG capsule Take 1 capsule (300 mg total) by mouth 2 (two) times daily.  . Glucosamine-Chondroit-Vit C-Mn (GLUCOSAMINE CHONDR 1500 COMPLX PO) Take 1 capsule by mouth 2 (two) times daily.    Marland Kitchen losartan (COZAAR) 100 MG tablet Take 100 mg by mouth  daily.  . metFORMIN (GLUCOPHAGE) 500 MG tablet Take 500 mg by mouth 2 (two) times daily with a meal.   . metoprolol (LOPRESSOR) 100 MG tablet TAKE ONE TABLET BY MOUTH TWICE DAILY  . mometasone (NASONEX) 50 MCG/ACT nasal spray Place 4 sprays into the nose daily.  . Multiple Vitamin (STRESSTABS PO) Take 1 tablet by mouth at bedtime.    . niacin (NIASPAN) 500 MG CR tablet Take 1,000 mg by mouth at bedtime. Taking Flush Free only  . nitroGLYCERIN (NITROSTAT) 0.4 MG SL tablet Place 1 tablet (0.4 mg total) under the tongue every 5 (five) minutes as needed. For chest pain  . Omega-3 Fatty Acids (FISH OIL) 1000 MG CAPS Take 2 capsules by mouth 4 (four) times daily.   Marland Kitchen omeprazole (PRILOSEC) 20 MG capsule Take 2 capsules (40 mg total) by mouth daily.  . pravastatin (PRAVACHOL) 40 MG tablet Take 40 mg by mouth every evening.  . psyllium (REGULOID) 0.52 G capsule Take 0.52 g by mouth 2 (two) times daily.    . Red Yeast Rice 600 MG CAPS Take 1 capsule by mouth 2 (two) times daily.    . tizanidine (ZANAFLEX) 2 MG capsule Take 1 capsule (2 mg total) by mouth 3 (three) times daily.  . traMADol (ULTRAM) 50 MG tablet Take 1 tablet (50 mg total) by mouth every 6 (six) hours as needed.  . venlafaxine XR (EFFEXOR-XR) 37.5 MG 24 hr capsule Take 1 capsule (37.5 mg total) by mouth daily with breakfast.   No facility-administered encounter medications on file as of 03/10/2015.    Activities of Daily Living In your present state of health, do you have any difficulty performing the following  activities: 03/10/2015  Hearing? N  Vision? N  Difficulty concentrating or making decisions? N  Walking or climbing stairs? N  Dressing or bathing? N  Doing errands, shopping? N    Patient Care Team: Janith Lima, MD as PCP - General (Internal Medicine) Sherren Mocha, MD as Consulting Physician (Cardiology) Ludwig Clarks, DO as Consulting Physician (Neurology) Marchia Bond, MD as Consulting Physician (Orthopedic Surgery)    Assessment:    Objective:   The goal of the wellness visit is to assist the patient how to close the gaps in care and create a preventative care plan for the patient.  Personalized Education was given regarding safety   Pt determined a personalized goal is to continue exercise and stress relief habits  Assessment included: smoking (secondary) educated as appropriate; including LDCT if as ppropriate  Bone density scan as appropriate- deferred to GYN Taking meds without issues; no barriers voiced   Labs were and fup visit noted with MD if labs are due to be re-drawn.  Stress: Recommendations for managing stress if assessed as a factor;   No Risk for hepatitis or high risk social behavior identified via hepatitis screen  Educated on shingles and follow up with insurance company for co-pays or charges applied to Part D benefit. Educated on Vaccines;  Safety issues reviewed;  Cognition assessed by AD8; Score 0  The patient was oriented x 3; appropriate in dress and manner and no objective failures at ADL's or IADL's.   Depression screen negative;   Functional status reviewed; no losses in function x 1 year  Bowel and bladder issues assessed with no issues x ? Acute UTI  End of life planning was discussed; discussed AD and in process of completing at this time. Will bring a copy in the office  Exercise Activities and Dietary recommendations    Goals    . continue to maintain her health     Continue to take time for self in lieu of taking care  of spouse. Continue exercise;  BMI; normal; eats well; loves vegetables and fruits      Fall Risk Fall Risk  03/10/2015 11/08/2014 01/20/2014 06/15/2013  Falls in the past year? Yes No Yes Yes  Number falls in past yr: 2 or more - 2 or more 1  Injury with Fall? - - - No  Risk Factor Category  - - High Fall Risk -  Risk for fall due to : (No Data) - History of fall(s) -  Risk for fall due to (comments): no impaired mobility at this time - - -  Follow up Education provided - - -   Depression Screen PHQ 2/9 Scores 03/10/2015 11/08/2014 01/20/2014 06/15/2013  PHQ - 2 Score 0 0 0 0     Cognitive Testing No flowsheet data found.  Immunization History  Administered Date(s) Administered  . Influenza Split 07/18/2011, 05/19/2012  . Influenza Whole 06/22/2009, 05/15/2010  . Influenza,inj,Quad PF,36+ Mos 06/15/2013, 05/21/2014  . Pneumococcal Conjugate-13 01/20/2014  . Pneumococcal Polysaccharide-23 11/08/2014  . Tdap 01/20/2014  . Zoster 07/08/2014   Screening Tests Health Maintenance  Topic Date Due  . OPHTHALMOLOGY EXAM  02/06/2015  . INFLUENZA VACCINE  04/04/2015  . HEMOGLOBIN A1C  05/11/2015  . FOOT EXAM  06/18/2015  . URINE MICROALBUMIN  11/08/2015  . COLONOSCOPY  12/03/2015  . TETANUS/TDAP  01/21/2024  . DEXA SCAN  Completed  . ZOSTAVAX  Completed  . PNA vac Low Risk Adult  Completed      Plan:    Plan  Review of screenings completed and plan for completion individualized as the patient will make apt for eye exams and fup with GYN for dexa scan and mammogram this year  Education and counseling performed based on assessed risks today. Patient provided with a copy of personalized plan for preventive services.    Referrals: no at this time  During the course of the visit the patient was educated and counseled about the following appropriate screening and preventive services:   Vaccines to include Pneumoccal, Influenza, Hepatitis B, Td, Zostavax,  HCV  Electrocardiogram  Cardiovascular Disease  Colorectal cancer screening  Bone density screening  Diabetes screening  Glaucoma screening  Mammography/PAP  Nutrition counseling   Patient Instructions (the written plan) was given to the patient.   Wynetta Fines, RN  03/10/2015

## 2015-03-10 NOTE — Patient Instructions (Addendum)
[name] [name] , Thank you for taking time to come for your Medicare Wellness Visit. I appreciate your ongoing commitment to your health goals. Please review the following plan we discussed and let me know if I can assist you in the future.   Please make apt for eye exam this week  and mammogram with GYN soon Will also discuss dexa scan with GYN   These are the goals we discussed: Goals    . continue to maintain her health     Continue to take time for self in lieu of taking care of spouse. Continue exercise;  BMI; normal; eats well; loves vegetables and fruits       This is a list of the screening recommended for you and due dates:  Health Maintenance  Topic Date Due  . Eye exam for diabetics  02/06/2015  . Flu Shot  04/04/2015  . Hemoglobin A1C  05/11/2015  . Complete foot exam   06/18/2015  . Urine Protein Check  11/08/2015  . Colon Cancer Screening  12/03/2015  . Tetanus Vaccine  01/21/2024  . DEXA scan (bone density measurement)  Completed  . Shingles Vaccine  Completed  . Pneumonia vaccines  Completed    Preventive Care for Adults A healthy lifestyle and preventive care can promote health and wellness. Preventive health guidelines for women include the following key practices.  A routine yearly physical is a good way to check with your health care provider about your health and preventive screening. It is a chance to share any concerns and updates on your health and to receive a thorough exam.  Visit your dentist for a routine exam and preventive care every 6 months. Brush your teeth twice a day and floss once a day. Good oral hygiene prevents tooth decay and gum disease.  The frequency of eye exams is based on your age, health, family medical history, use of contact lenses, and other factors. Follow your health care provider's recommendations for frequency of eye exams.  Eat a healthy diet. Foods like vegetables, fruits, whole grains, low-fat dairy products, and lean  protein foods contain the nutrients you need without too many calories. Decrease your intake of foods high in solid fats, added sugars, and salt. Eat the right amount of calories for you.Get information about a proper diet from your health care provider, if necessary.  Regular physical exercise is one of the most important things you can do for your health. Most adults should get at least 150 minutes of moderate-intensity exercise (any activity that increases your heart rate and causes you to sweat) each week. In addition, most adults need muscle-strengthening exercises on 2 or more days a week.  Maintain a healthy weight. The body mass index (BMI) is a screening tool to identify possible weight problems. It provides an estimate of body fat based on height and weight. Your health care provider can find your BMI and can help you achieve or maintain a healthy weight.For adults 20 years and older:  A BMI below 18.5 is considered underweight.  A BMI of 18.5 to 24.9 is normal.  A BMI of 25 to 29.9 is considered overweight.  A BMI of 30 and above is considered obese.  Maintain normal blood lipids and cholesterol levels by exercising and minimizing your intake of saturated fat. Eat a balanced diet with plenty of fruit and vegetables. Blood tests for lipids and cholesterol should begin at age 4 and be repeated every 5 years. If your lipid or cholesterol  levels are high, you are over 50, or you are at high risk for heart disease, you may need your cholesterol levels checked more frequently.Ongoing high lipid and cholesterol levels should be treated with medicines if diet and exercise are not working.  If you smoke, find out from your health care provider how to quit. If you do not use tobacco, do not start.  Lung cancer screening is recommended for adults aged 72-80 years who are at high risk for developing lung cancer because of a history of smoking. A yearly low-dose CT scan of the lungs is  recommended for people who have at least a 30-pack-year history of smoking and are a current smoker or have quit within the past 15 years. A pack year of smoking is smoking an average of 1 pack of cigarettes a day for 1 year (for example: 1 pack a day for 30 years or 2 packs a day for 15 years). Yearly screening should continue until the smoker has stopped smoking for at least 15 years. Yearly screening should be stopped for people who develop a health problem that would prevent them from having lung cancer treatment.  If you are pregnant, do not drink alcohol. If you are breastfeeding, be very cautious about drinking alcohol. If you are not pregnant and choose to drink alcohol, do not have more than 1 drink per day. One drink is considered to be 12 ounces (355 mL) of beer, 5 ounces (148 mL) of wine, or 1.5 ounces (44 mL) of liquor.  Avoid use of street drugs. Do not share needles with anyone. Ask for help if you need support or instructions about stopping the use of drugs.  High blood pressure causes heart disease and increases the risk of stroke. Your blood pressure should be checked at least every 1 to 2 years. Ongoing high blood pressure should be treated with medicines if weight loss and exercise do not work.  If you are 68-39 years old, ask your health care provider if you should take aspirin to prevent strokes.  Diabetes screening involves taking a blood sample to check your fasting blood sugar level. This should be done once every 3 years, after age 13, if you are within normal weight and without risk factors for diabetes. Testing should be considered at a younger age or be carried out more frequently if you are overweight and have at least 1 risk factor for diabetes.  Breast cancer screening is essential preventive care for women. You should practice "breast self-awareness." This means understanding the normal appearance and feel of your breasts and may include breast self-examination. Any  changes detected, no matter how small, should be reported to a health care provider. Women in their 38s and 30s should have a clinical breast exam (CBE) by a health care provider as part of a regular health exam every 1 to 3 years. After age 44, women should have a CBE every year. Starting at age 12, women should consider having a mammogram (breast X-ray test) every year. Women who have a family history of breast cancer should talk to their health care provider about genetic screening. Women at a high risk of breast cancer should talk to their health care providers about having an MRI and a mammogram every year.  Breast cancer gene (BRCA)-related cancer risk assessment is recommended for women who have family members with BRCA-related cancers. BRCA-related cancers include breast, ovarian, tubal, and peritoneal cancers. Having family members with these cancers may be associated with an  increased risk for harmful changes (mutations) in the breast cancer genes BRCA1 and BRCA2. Results of the assessment will determine the need for genetic counseling and BRCA1 and BRCA2 testing.  Routine pelvic exams to screen for cancer are no longer recommended for nonpregnant women who are considered low risk for cancer of the pelvic organs (ovaries, uterus, and vagina) and who do not have symptoms. Ask your health care provider if a screening pelvic exam is right for you.  If you have had past treatment for cervical cancer or a condition that could lead to cancer, you need Pap tests and screening for cancer for at least 20 years after your treatment. If Pap tests have been discontinued, your risk factors (such as having a new sexual partner) need to be reassessed to determine if screening should be resumed. Some women have medical problems that increase the chance of getting cervical cancer. In these cases, your health care provider may recommend more frequent screening and Pap tests.  The HPV test is an additional test that  may be used for cervical cancer screening. The HPV test looks for the virus that can cause the cell changes on the cervix. The cells collected during the Pap test can be tested for HPV. The HPV test could be used to screen women aged 11 years and older, and should be used in women of any age who have unclear Pap test results. After the age of 25, women should have HPV testing at the same frequency as a Pap test.  Colorectal cancer can be detected and often prevented. Most routine colorectal cancer screening begins at the age of 58 years and continues through age 28 years. However, your health care provider may recommend screening at an earlier age if you have risk factors for colon cancer. On a yearly basis, your health care provider may provide home test kits to check for hidden blood in the stool. Use of a small camera at the end of a tube, to directly examine the colon (sigmoidoscopy or colonoscopy), can detect the earliest forms of colorectal cancer. Talk to your health care provider about this at age 7, when routine screening begins. Direct exam of the colon should be repeated every 5-10 years through age 15 years, unless early forms of pre-cancerous polyps or small growths are found.  People who are at an increased risk for hepatitis B should be screened for this virus. You are considered at high risk for hepatitis B if:  You were born in a country where hepatitis B occurs often. Talk with your health care provider about which countries are considered high risk.  Your parents were born in a high-risk country and you have not received a shot to protect against hepatitis B (hepatitis B vaccine).  You have HIV or AIDS.  You use needles to inject street drugs.  You live with, or have sex with, someone who has hepatitis B.  You get hemodialysis treatment.  You take certain medicines for conditions like cancer, organ transplantation, and autoimmune conditions.  Hepatitis C blood testing is  recommended for all people born from [birth year] through 1966 and any individual with known risks for hepatitis C.  Practice safe sex. Use condoms and avoid high-risk sexual practices to reduce the spread of sexually transmitted infections (STIs). STIs include gonorrhea, chlamydia, syphilis, trichomonas, herpes, HPV, and human immunodeficiency virus (HIV). Herpes, HIV, and HPV are viral illnesses that have no cure. They can result in disability, cancer, and death.  You should be  screened for sexually transmitted illnesses (STIs) including gonorrhea and chlamydia if:  You are sexually active and are younger than 24 years.  You are older than 24 years and your health care provider tells you that you are at risk for this type of infection.  Your sexual activity has changed since you were last screened and you are at an increased risk for chlamydia or gonorrhea. Ask your health care provider if you are at risk.  If you are at risk of being infected with HIV, it is recommended that you take a prescription medicine daily to prevent HIV infection. This is called preexposure prophylaxis (PrEP). You are considered at risk if:  You are a heterosexual woman, are sexually active, and are at increased risk for HIV infection.  You take drugs by injection.  You are sexually active with a partner who has HIV.  Talk with your health care provider about whether you are at high risk of being infected with HIV. If you choose to begin PrEP, you should first be tested for HIV. You should then be tested every 3 months for as long as you are taking PrEP.  Osteoporosis is a disease in which the bones lose minerals and strength with aging. This can result in serious bone fractures or breaks. The risk of osteoporosis can be identified using a bone density scan. Women ages 28 years and over and women at risk for fractures or osteoporosis should discuss screening with their health care providers. Ask your health care provider  whether you should take a calcium supplement or vitamin D to reduce the rate of osteoporosis.  Menopause can be associated with physical symptoms and risks. Hormone replacement therapy is available to decrease symptoms and risks. You should talk to your health care provider about whether hormone replacement therapy is right for you.  Use sunscreen. Apply sunscreen liberally and repeatedly throughout the day. You should seek shade when your shadow is shorter than you. Protect yourself by wearing long sleeves, pants, a wide-brimmed hat, and sunglasses year round, whenever you are outdoors.  Once a month, do a whole body skin exam, using a mirror to look at the skin on your back. Tell your health care provider of new moles, moles that have irregular borders, moles that are larger than a pencil eraser, or moles that have changed in shape or color.  Stay current with required vaccines (immunizations).  Influenza vaccine. All adults should be immunized every year.  Tetanus, diphtheria, and acellular pertussis (Td, Tdap) vaccine. Pregnant women should receive 1 dose of Tdap vaccine during each pregnancy. The dose should be obtained regardless of the length of time since the last dose. Immunization is preferred during the 27th-36th week of gestation. An adult who has not previously received Tdap or who does not know her vaccine status should receive 1 dose of Tdap. This initial dose should be followed by tetanus and diphtheria toxoids (Td) booster doses every 10 years. Adults with an unknown or incomplete history of completing a 3-dose immunization series with Td-containing vaccines should begin or complete a primary immunization series including a Tdap dose. Adults should receive a Td booster every 10 years.  Varicella vaccine. An adult without evidence of immunity to varicella should receive 2 doses or a second dose if she has previously received 1 dose. Pregnant females who do not have evidence of immunity  should receive the first dose after pregnancy. This first dose should be obtained before leaving the health care facility. The second  dose should be obtained 4-8 weeks after the first dose.  Human papillomavirus (HPV) vaccine. Females aged 13-26 years who have not received the vaccine previously should obtain the 3-dose series. The vaccine is not recommended for use in pregnant females. However, pregnancy testing is not needed before receiving a dose. If a female is found to be pregnant after receiving a dose, no treatment is needed. In that case, the remaining doses should be delayed until after the pregnancy. Immunization is recommended for any person with an immunocompromised condition through the age of 35 years if she did not get any or all doses earlier. During the 3-dose series, the second dose should be obtained 4-8 weeks after the first dose. The third dose should be obtained 24 weeks after the first dose and 16 weeks after the second dose.  Zoster vaccine. One dose is recommended for adults aged 30 years or older unless certain conditions are present.  Measles, mumps, and rubella (MMR) vaccine. Adults born before [birth year] generally are considered immune to measles and mumps. Adults born in [birth year] or later should have 1 or more doses of MMR vaccine unless there is a contraindication to the vaccine or there is laboratory evidence of immunity to each of the three diseases. A routine second dose of MMR vaccine should be obtained at least 28 days after the first dose for students attending postsecondary schools, health care workers, or international travelers. People who received inactivated measles vaccine or an unknown type of measles vaccine during 1963-1967 should receive 2 doses of MMR vaccine. People who received inactivated mumps vaccine or an unknown type of mumps vaccine before 1979 and are at high risk for mumps infection should consider immunization with 2 doses of MMR vaccine. For females of  childbearing age, rubella immunity should be determined. If there is no evidence of immunity, females who are not pregnant should be vaccinated. If there is no evidence of immunity, females who are pregnant should delay immunization until after pregnancy. Unvaccinated health care workers born before [birth year] who lack laboratory evidence of measles, mumps, or rubella immunity or laboratory confirmation of disease should consider measles and mumps immunization with 2 doses of MMR vaccine or rubella immunization with 1 dose of MMR vaccine.  Pneumococcal 13-valent conjugate (PCV13) vaccine. When indicated, a person who is uncertain of her immunization history and has no record of immunization should receive the PCV13 vaccine. An adult aged 78 years or older who has certain medical conditions and has not been previously immunized should receive 1 dose of PCV13 vaccine. This PCV13 should be followed with a dose of pneumococcal polysaccharide (PPSV23) vaccine. The PPSV23 vaccine dose should be obtained at least 8 weeks after the dose of PCV13 vaccine. An adult aged 85 years or older who has certain medical conditions and previously received 1 or more doses of PPSV23 vaccine should receive 1 dose of PCV13. The PCV13 vaccine dose should be obtained 1 or more years after the last PPSV23 vaccine dose.  Pneumococcal polysaccharide (PPSV23) vaccine. When PCV13 is also indicated, PCV13 should be obtained first. All adults aged 53 years and older should be immunized. An adult younger than age 5 years who has certain medical conditions should be immunized. Any person who resides in a nursing home or long-term care facility should be immunized. An adult smoker should be immunized. People with an immunocompromised condition and certain other conditions should receive both PCV13 and PPSV23 vaccines. People with human immunodeficiency virus (HIV) infection should be immunized as  soon as possible after diagnosis. Immunization during  chemotherapy or radiation therapy should be avoided. Routine use of PPSV23 vaccine is not recommended for American Indians, [place] Natives, or people younger than 65 years unless there are medical conditions that require PPSV23 vaccine. When indicated, people who have unknown immunization and have no record of immunization should receive PPSV23 vaccine. One-time revaccination 5 years after the first dose of PPSV23 is recommended for people aged 19-64 years who have chronic kidney failure, nephrotic syndrome, asplenia, or immunocompromised conditions. People who received 1-2 doses of PPSV23 before age 24 years should receive another dose of PPSV23 vaccine at age 30 years or later if at least 5 years have passed since the previous dose. Doses of PPSV23 are not needed for people immunized with PPSV23 at or after age 20 years.  Meningococcal vaccine. Adults with asplenia or persistent complement component deficiencies should receive 2 doses of quadrivalent meningococcal conjugate (MenACWY-D) vaccine. The doses should be obtained at least 2 months apart. Microbiologists working with certain meningococcal bacteria, [place] recruits, people at risk during an outbreak, and people who travel to or live in countries with a high rate of meningitis should be immunized. A first-year college student up through age 69 years who is living in a residence hall should receive a dose if she did not receive a dose on or after her 16th birthday. Adults who have certain high-risk conditions should receive one or more doses of vaccine.  Hepatitis A vaccine. Adults who wish to be protected from this disease, have certain high-risk conditions, work with hepatitis A-infected animals, work in hepatitis A research labs, or travel to or work in countries with a high rate of hepatitis A should be immunized. Adults who were previously unvaccinated and who anticipate close contact with an international adoptee during the first 60 days after  arrival in the [place] from a country with a high rate of hepatitis A should be immunized.  Hepatitis B vaccine. Adults who wish to be protected from this disease, have certain high-risk conditions, may be exposed to blood or other infectious body fluids, are household contacts or sex partners of hepatitis B positive people, are clients or workers in certain care facilities, or travel to or work in countries with a high rate of hepatitis B should be immunized.  Haemophilus influenzae type b (Hib) vaccine. A previously unvaccinated person with asplenia or sickle cell disease or having a scheduled splenectomy should receive 1 dose of Hib vaccine. Regardless of previous immunization, a recipient of a hematopoietic stem cell transplant should receive a 3-dose series 6-12 months after her successful transplant. Hib vaccine is not recommended for adults with HIV infection. Preventive Services / Frequency Ages 17 to 7 years  Blood pressure check.** / Every 1 to 2 years.  Lipid and cholesterol check.** / Every 5 years beginning at age 67.  Clinical breast exam.** / Every 3 years for women in their 39s and 80s.  BRCA-related cancer risk assessment.** / For women who have family members with a BRCA-related cancer (breast, ovarian, tubal, or peritoneal cancers).  Pap test.** / Every 2 years from ages 31 through 78. Every 3 years starting at age 58 through age 76 or 22 with a history of 3 consecutive normal Pap tests.  HPV screening.** / Every 3 years from ages 1 through ages 29 to 78 with a history of 3 consecutive normal Pap tests.  Hepatitis C blood test.** / For any individual with known risks for hepatitis C.  Skin self-exam. / Monthly.  Influenza vaccine. / Every year.  Tetanus, diphtheria, and acellular pertussis (Tdap, Td) vaccine.** / Consult your health care provider. Pregnant women should receive 1 dose of Tdap vaccine during each pregnancy. 1 dose of Td every 10 years.  Varicella  vaccine.** / Consult your health care provider. Pregnant females who do not have evidence of immunity should receive the first dose after pregnancy.  HPV vaccine. / 3 doses over 6 months, if 1 and younger. The vaccine is not recommended for use in pregnant females. However, pregnancy testing is not needed before receiving a dose.  Measles, mumps, rubella (MMR) vaccine.** / You need at least 1 dose of MMR if you were born in 1957 or later. You may also need a 2nd dose. For females of childbearing age, rubella immunity should be determined. If there is no evidence of immunity, females who are not pregnant should be vaccinated. If there is no evidence of immunity, females who are pregnant should delay immunization until after pregnancy.  Pneumococcal 13-valent conjugate (PCV13) vaccine.** / Consult your health care provider.  Pneumococcal polysaccharide (PPSV23) vaccine.** / 1 to 2 doses if you smoke cigarettes or if you have certain conditions.  Meningococcal vaccine.** / 1 dose if you are age 75 to 42 years and a Market researcher living in a residence hall, or have one of several medical conditions, you need to get vaccinated against meningococcal disease. You may also need additional booster doses.  Hepatitis A vaccine.** / Consult your health care provider.  Hepatitis B vaccine.** / Consult your health care provider.  Haemophilus influenzae type b (Hib) vaccine.** / Consult your health care provider. Ages 79 to 49 years  Blood pressure check.** / Every 1 to 2 years.  Lipid and cholesterol check.** / Every 5 years beginning at age 27 years.  Lung cancer screening. / Every year if you are aged 49-80 years and have a 30-pack-year history of smoking and currently smoke or have quit within the past 15 years. Yearly screening is stopped once you have quit smoking for at least 15 years or develop a health problem that would prevent you from having lung cancer treatment.  Clinical breast  exam.** / Every year after age 74 years.  BRCA-related cancer risk assessment.** / For women who have family members with a BRCA-related cancer (breast, ovarian, tubal, or peritoneal cancers).  Mammogram.** / Every year beginning at age 28 years and continuing for as long as you are in good health. Consult with your health care provider.  Pap test.** / Every 3 years starting at age 22 years through age 64 or 50 years with a history of 3 consecutive normal Pap tests.  HPV screening.** / Every 3 years from ages 99 years through ages 6 to 15 years with a history of 3 consecutive normal Pap tests.  Fecal occult blood test (FOBT) of stool. / Every year beginning at age 52 years and continuing until age 64 years. You may not need to do this test if you get a colonoscopy every 10 years.  Flexible sigmoidoscopy or colonoscopy.** / Every 5 years for a flexible sigmoidoscopy or every 10 years for a colonoscopy beginning at age 67 years and continuing until age 62 years.  Hepatitis C blood test.** / For all people born from [birth year] through 1966 and any individual with known risks for hepatitis C.  Skin self-exam. / Monthly.  Influenza vaccine. / Every year.  Tetanus, diphtheria, and acellular pertussis (Tdap/Td) vaccine.** /  Consult your health care provider. Pregnant women should receive 1 dose of Tdap vaccine during each pregnancy. 1 dose of Td every 10 years.  Varicella vaccine.** / Consult your health care provider. Pregnant females who do not have evidence of immunity should receive the first dose after pregnancy.  Zoster vaccine.** / 1 dose for adults aged 29 years or older.  Measles, mumps, rubella (MMR) vaccine.** / You need at least 1 dose of MMR if you were born in 1957 or later. You may also need a 2nd dose. For females of childbearing age, rubella immunity should be determined. If there is no evidence of immunity, females who are not pregnant should be vaccinated. If there is no evidence of  immunity, females who are pregnant should delay immunization until after pregnancy.  Pneumococcal 13-valent conjugate (PCV13) vaccine.** / Consult your health care provider.  Pneumococcal polysaccharide (PPSV23) vaccine.** / 1 to 2 doses if you smoke cigarettes or if you have certain conditions.  Meningococcal vaccine.** / Consult your health care provider.  Hepatitis A vaccine.** / Consult your health care provider.  Hepatitis B vaccine.** / Consult your health care provider.  Haemophilus influenzae type b (Hib) vaccine.** / Consult your health care provider. Ages 59 years and over  Blood pressure check.** / Every 1 to 2 years.  Lipid and cholesterol check.** / Every 5 years beginning at age 35 years.  Lung cancer screening. / Every year if you are aged 46-80 years and have a 30-pack-year history of smoking and currently smoke or have quit within the past 15 years. Yearly screening is stopped once you have quit smoking for at least 15 years or develop a health problem that would prevent you from having lung cancer treatment.  Clinical breast exam.** / Every year after age 67 years.  BRCA-related cancer risk assessment.** / For women who have family members with a BRCA-related cancer (breast, ovarian, tubal, or peritoneal cancers).  Mammogram.** / Every year beginning at age 75 years and continuing for as long as you are in good health. Consult with your health care provider.  Pap test.** / Every 3 years starting at age 53 years through age 78 or 34 years with 3 consecutive normal Pap tests. Testing can be stopped between 65 and 70 years with 3 consecutive normal Pap tests and no abnormal Pap or HPV tests in the past 10 years.  HPV screening.** / Every 3 years from ages 5 years through ages 70 or 47 years with a history of 3 consecutive normal Pap tests. Testing can be stopped between 65 and 70 years with 3 consecutive normal Pap tests and no abnormal Pap or HPV tests in the past 10  years.  Fecal occult blood test (FOBT) of stool. / Every year beginning at age 94 years and continuing until age 34 years. You may not need to do this test if you get a colonoscopy every 10 years.  Flexible sigmoidoscopy or colonoscopy.** / Every 5 years for a flexible sigmoidoscopy or every 10 years for a colonoscopy beginning at age 57 years and continuing until age 83 years.  Hepatitis C blood test.** / For all people born from [birth year] through 1966 and any individual with known risks for hepatitis C.  Osteoporosis screening.** / A one-time screening for women ages 22 years and over and women at risk for fractures or osteoporosis.  Skin self-exam. / Monthly.  Influenza vaccine. / Every year.  Tetanus, diphtheria, and acellular pertussis (Tdap/Td) vaccine.** / 1 dose of Td  every 10 years.  Varicella vaccine.** / Consult your health care provider.  Zoster vaccine.** / 1 dose for adults aged 98 years or older.  Pneumococcal 13-valent conjugate (PCV13) vaccine.** / Consult your health care provider.  Pneumococcal polysaccharide (PPSV23) vaccine.** / 1 dose for all adults aged 3 years and older.  Meningococcal vaccine.** / Consult your health care provider.  Hepatitis A vaccine.** / Consult your health care provider.  Hepatitis B vaccine.** / Consult your health care provider.  Haemophilus influenzae type b (Hib) vaccine.** / Consult your health care provider. ** Family history and personal history of risk and conditions may change your health care provider's recommendations. Document Released: 10/16/2001 Document Revised: 01/04/2014 Document Reviewed: 01/15/2011 [facility] Patient Information 2015 [place], [place]. This information is not intended to replace advice given to you by your health care provider. Make sure you discuss any questions you have with your health care provider.

## 2015-03-11 ENCOUNTER — Ambulatory Visit: Payer: PPO | Admitting: Internal Medicine

## 2015-03-12 LAB — CULTURE, URINE COMPREHENSIVE: Colony Count: 80000

## 2015-03-13 ENCOUNTER — Other Ambulatory Visit: Payer: Self-pay | Admitting: Internal Medicine

## 2015-03-13 ENCOUNTER — Encounter: Payer: Self-pay | Admitting: Internal Medicine

## 2015-03-13 MED ORDER — SULFAMETHOXAZOLE-TRIMETHOPRIM 800-160 MG PO TABS: 1.0000 | ORAL_TABLET | Freq: Two times a day (BID) | ORAL | Status: AC

## 2015-03-13 NOTE — Assessment & Plan Note (Signed)

## 2015-03-13 NOTE — Progress Notes (Signed)
Subjective:  Patient ID: [name] [name], female    DOB: February 20, 1941  Age: 74 y.o. MRN: [id number]  CC: Medicare Wellness   HPI [name] [name] presents for a Medicare wellness exam but she also complains of a 4 day history of dysuria, frequency, and urgency.  Outpatient Prescriptions Prior to Visit  Medication Sig Dispense Refill  . amLODipine (NORVASC) 5 MG tablet TAKE ONE TABLET BY MOUTH ONCE DAILY 30 tablet 10  . Ascorbic Acid (VITAMIN C) 1000 MG tablet Take 1,000 mg by mouth daily.      Marland Kitchen aspirin EC 81 MG tablet Take 81 mg by mouth every morning.     . calcium carbonate (OS-CAL) 600 MG TABS Take 600 mg by mouth 2 (two) times daily with a meal.      . Cholecalciferol (VITAMIN D3) 1000 UNITS CAPS Take 1 capsule by mouth daily.      . Cinnamon 500 MG capsule Take 500 mg by mouth 2 (two) times daily.     . clopidogrel (PLAVIX) 75 MG tablet TAKE ONE TABLET BY MOUTH ONCE DAILY. 30 tablet 6  . Cod Liver Oil 1000 MG CAPS Take 1 capsule by mouth 2 (two) times daily.      . Coenzyme Q10 (CO Q 10) 100 MG CAPS Take 1 capsule by mouth daily.      Marland Kitchen dicyclomine (BENTYL) 10 MG capsule Take 10 mg by mouth 3 (three) times daily before meals.    . dicyclomine (BENTYL) 10 MG capsule TAKE ONE CAPSULE BY MOUTH THREE TIMES DAILY 90 capsule 3  . Flaxseed, Linseed, 1000 MG CAPS Take 1 capsule by mouth 2 (two) times daily.     . furosemide (LASIX) 20 MG tablet Take 1 tablet (20 mg total) by mouth daily. 90 tablet 3  . gabapentin (NEURONTIN) 300 MG capsule Take 1 capsule (300 mg total) by mouth 2 (two) times daily. 180 capsule 3  . Glucosamine-Chondroit-Vit C-Mn (GLUCOSAMINE CHONDR 1500 COMPLX PO) Take 1 capsule by mouth 2 (two) times daily.      Marland Kitchen losartan (COZAAR) 100 MG tablet Take 100 mg by mouth daily.    . metFORMIN (GLUCOPHAGE) 500 MG tablet Take 500 mg by mouth 2 (two) times daily with a meal.     . metoprolol (LOPRESSOR) 100 MG tablet TAKE ONE TABLET BY MOUTH TWICE DAILY 60 tablet 11  . mometasone  (NASONEX) 50 MCG/ACT nasal spray Place 4 sprays into the nose daily. 17 g 12  . Multiple Vitamin (STRESSTABS PO) Take 1 tablet by mouth at bedtime.      . niacin (NIASPAN) 500 MG CR tablet Take 1,000 mg by mouth at bedtime. Taking Flush Free only    . nitroGLYCERIN (NITROSTAT) 0.4 MG SL tablet Place 1 tablet (0.4 mg total) under the tongue every 5 (five) minutes as needed. For chest pain 25 tablet 2  . Omega-3 Fatty Acids (FISH OIL) 1000 MG CAPS Take 2 capsules by mouth 4 (four) times daily.     Marland Kitchen omeprazole (PRILOSEC) 20 MG capsule Take 2 capsules (40 mg total) by mouth daily. 180 capsule 3  . pravastatin (PRAVACHOL) 40 MG tablet Take 40 mg by mouth every evening.    . psyllium (REGULOID) 0.52 G capsule Take 0.52 g by mouth 2 (two) times daily.      . Red Yeast Rice 600 MG CAPS Take 1 capsule by mouth 2 (two) times daily.      . tizanidine (ZANAFLEX) 2 MG capsule Take 1 capsule (  2 mg total) by mouth 3 (three) times daily. 90 capsule 3  . traMADol (ULTRAM) 50 MG tablet Take 1 tablet (50 mg total) by mouth every 6 (six) hours as needed. 15 tablet 0  . venlafaxine XR (EFFEXOR-XR) 37.5 MG 24 hr capsule Take 1 capsule (37.5 mg total) by mouth daily with breakfast. 30 capsule 11   No facility-administered medications prior to visit.    ROS Review of Systems  Constitutional: Negative.  Negative for fever, chills, diaphoresis, appetite change and fatigue.  HENT: Negative.   Eyes: Negative.   Respiratory: Negative.  Negative for cough, choking, chest tightness, shortness of breath and stridor.   Cardiovascular: Negative.  Negative for chest pain, palpitations and leg swelling.  Gastrointestinal: Negative.  Negative for nausea, abdominal pain, diarrhea, constipation and blood in stool.  Endocrine: Negative.   Genitourinary: Positive for dysuria, urgency and frequency. Negative for hematuria, flank pain, decreased urine volume, vaginal bleeding, vaginal discharge, enuresis, difficulty urinating,  vaginal pain and dyspareunia.  Musculoskeletal: Negative.   Skin: Negative.  Negative for rash.  Allergic/Immunologic: Negative.   Neurological: Negative.  Negative for dizziness, tremors, syncope, light-headedness and numbness.  Hematological: Negative.   Psychiatric/Behavioral: Negative.     Objective:  BP 140/80 mmHg  Pulse 70  Temp(Src) 98.5 F (36.9 C) (Oral)  Ht 5\' 1"  (1.549 m)  Wt 130 lb 12 oz (59.308 kg)  BMI 24.72 kg/m2  SpO2 97%  BP Readings from Last 3 Encounters:  03/10/15 140/80  11/09/14 138/62  11/08/14 118/78    Wt Readings from Last 3 Encounters:  03/10/15 130 lb 12 oz (59.308 kg)  11/09/14 131 lb (59.421 kg)  11/08/14 131 lb (59.421 kg)    Physical Exam  Constitutional: She is oriented to person, place, and time. She appears well-developed and well-nourished.  Non-toxic appearance. She does not have a sickly appearance. She does not appear ill. No distress.  HENT:  Mouth/Throat: Oropharynx is clear and moist. No oropharyngeal exudate.  Eyes: Conjunctivae are normal. Right eye exhibits no discharge. Left eye exhibits no discharge. No scleral icterus.  Neck: Normal range of motion. Neck supple. No JVD present. No tracheal deviation present. No thyromegaly present.  Cardiovascular: Normal rate, regular rhythm, normal heart sounds and intact distal pulses.  Exam reveals no gallop and no friction rub.   No murmur heard. Pulmonary/Chest: Effort normal and breath sounds normal. No stridor. No respiratory distress. She has no wheezes. She has no rales. She exhibits no tenderness.  Abdominal: Soft. Bowel sounds are normal. She exhibits no distension and no mass. There is no tenderness. There is no rebound and no guarding.  Musculoskeletal: Normal range of motion. She exhibits no edema or tenderness.  Lymphadenopathy:    She has no cervical adenopathy.  Neurological: She is oriented to person, place, and time.  Skin: Skin is warm and dry. No rash noted. She is not  diaphoretic. No erythema. No pallor.  Psychiatric: She has a normal mood and affect. Her behavior is normal. Judgment and thought content normal.  Vitals reviewed.   Lab Results  Component Value Date   WBC 9.3 03/10/2015   HGB 14.0 03/10/2015   HCT 41.4 03/10/2015   PLT 338.0 03/10/2015   GLUCOSE 135* 03/10/2015   CHOL 149 03/10/2015   TRIG 225.0* 03/10/2015   HDL 41.20 03/10/2015   LDLDIRECT 68.0 03/10/2015   LDLCALC 85 01/20/2014   ALT 27 03/10/2015   AST 20 03/10/2015   NA 136 03/10/2015   K 4.7  03/10/2015   CL 97 03/10/2015   CREATININE 0.81 03/10/2015   BUN 12 03/10/2015   CO2 33* 03/10/2015   TSH 1.76 03/10/2015   INR 1.54* 09/28/2011   HGBA1C 6.4 03/10/2015   MICROALBUR 1.2 11/08/2014    Dg Chest 2 View  06/17/2014   CLINICAL DATA:  Cough x3 days.  EXAM: CHEST  2 VIEW  COMPARISON:  01/20/2014.  FINDINGS: Mediastinum and hilar structures normal. Lungs are clear. Heart size normal. No pleural effusion or pneumothorax. Prior cervical spine fusion. Surgical clips right upper quadrant.  IMPRESSION: No active cardiopulmonary disease.   Electronically Signed   By: Marcello Moores  Register   On: 06/17/2014 11:49    Assessment & Plan:   Lindzie was seen today for medicare wellness.  Diagnoses and all orders for this visit:  Routine history and physical examination of adult  Atherosclerosis of native coronary artery of native heart without angina pectoris- she has no chest pain or shortness of breath and appears stable will continue risk modification Orders: -     Lipid panel; Future  Essential hypertension- her blood pressure is well controlled, lytes and renal function are stable Orders: -     Comprehensive metabolic panel; Future -     CBC with Differential/Platelet; Future  Controlled diabetes mellitus type 2 with complications- her blood sugars are well controlled, will continue metformin Orders: -     Hemoglobin A1c; Future  Hyperlipidemia with target LDL less than  100- she has achieved her LDL goal and is doing well on a statin Orders: -     Lipid panel; Future -     TSH; Future  Dysuria- her urine culture is positive for Klebsiella which is sensitive to Bactrim Orders: -     POCT urinalysis dipstick -     CULTURE, URINE COMPREHENSIVE; Future   I am having Ms. Gomes maintain her Cinnamon, Glucosamine-Chondroit-Vit C-Mn (GLUCOSAMINE CHONDR 1500 COMPLX PO), calcium carbonate, Red Yeast Rice, Flaxseed (Linseed), Fish Oil, Co Q 10, Multiple Vitamin (STRESSTABS PO), Vitamin D3, Cod Liver Oil, psyllium, vitamin C, niacin, aspirin EC, dicyclomine, losartan, pravastatin, traMADol, venlafaxine XR, metFORMIN, omeprazole, gabapentin, furosemide, mometasone, tizanidine, nitroGLYCERIN, metoprolol, amLODipine, dicyclomine, and clopidogrel.  No orders of the defined types were placed in this encounter.   See AVS for instructions about healthy living and anticipatory guidance.  Follow-up: Return in 4 months (on 07/11/2015).  [name], MD

## 2015-03-14 ENCOUNTER — Other Ambulatory Visit: Payer: Self-pay | Admitting: Internal Medicine

## 2015-03-28 ENCOUNTER — Other Ambulatory Visit: Payer: Self-pay | Admitting: Internal Medicine

## 2015-04-29 ENCOUNTER — Other Ambulatory Visit: Payer: Self-pay | Admitting: Internal Medicine

## 2015-07-04 ENCOUNTER — Other Ambulatory Visit: Payer: Self-pay | Admitting: Internal Medicine

## 2015-07-14 ENCOUNTER — Ambulatory Visit: Payer: Self-pay | Admitting: Internal Medicine

## 2015-07-14 ENCOUNTER — Ambulatory Visit: Payer: PPO | Admitting: Internal Medicine

## 2015-07-18 ENCOUNTER — Other Ambulatory Visit: Payer: Self-pay | Admitting: Internal Medicine

## 2015-07-20 ENCOUNTER — Ambulatory Visit (INDEPENDENT_AMBULATORY_CARE_PROVIDER_SITE_OTHER)
Admission: RE | Admit: 2015-07-20 | Discharge: 2015-07-20 | Disposition: A | Discharge status: Home / Self Care | Payer: PPO | Source: Ambulatory Visit | Attending: Internal Medicine | Admitting: Internal Medicine

## 2015-07-20 ENCOUNTER — Ambulatory Visit (INDEPENDENT_AMBULATORY_CARE_PROVIDER_SITE_OTHER): Payer: PPO | Admitting: Internal Medicine

## 2015-07-20 ENCOUNTER — Encounter: Payer: Self-pay | Admitting: Internal Medicine

## 2015-07-20 VITALS — BP 136/86 | HR 78 | Temp 97.9°F | Resp 18 | Ht 61.0 in | Wt 126.0 lb

## 2015-07-20 DIAGNOSIS — M542 Cervicalgia: Secondary | ICD-10-CM

## 2015-07-20 DIAGNOSIS — Z23 Encounter for immunization: Secondary | ICD-10-CM | POA: Diagnosis not present

## 2015-07-20 MED ORDER — TRAMADOL HCL 50 MG PO TABS: 50.0000 mg | ORAL_TABLET | Freq: Four times a day (QID) | ORAL | Status: DC | PRN

## 2015-07-20 NOTE — Progress Notes (Signed)
Pre visit review using our clinic review tool, if applicable. No additional management support is needed unless otherwise documented below in the visit note. 

## 2015-07-20 NOTE — Patient Instructions (Signed)
Acute Torticollis °Torticollis is a condition in which the muscles of the neck tighten (contract) abnormally, causing the neck to twist and the head to move into an unnatural position. Torticollis that develops suddenly is called acute torticollis. If torticollis becomes chronic and is left untreated, the face and neck can become deformed. °CAUSES °This condition may be caused by: °· Sleeping in an awkward position (common). °· Extending or twisting the neck muscles beyond their normal position. °· Infection. °In some cases, the cause may not be known. °SYMPTOMS °Symptoms of this condition include: °· An unnatural position of the head. °· Neck pain. °· A limited ability to move the neck. °· Twisting of the neck to one side. °DIAGNOSIS °This condition is diagnosed with a physical exam. You may also have imaging tests, such as an X-ray, CT scan, or MRI. °TREATMENT °Treatment for this condition involves trying to relax the neck muscles. It may include: °· Medicines or shots. °· Physical therapy. °· Surgery. This may be done in severe cases. °HOME CARE INSTRUCTIONS °· Take medicines only as directed by your health care provider. °· Do stretching exercises and massage your neck as directed by your health care provider. °· Keep all follow-up visits as directed by your health care provider. This is important. °SEEK MEDICAL CARE IF: °· You develop a fever. °SEEK IMMEDIATE MEDICAL CARE IF: °· You develop difficulty breathing. °· You develop noisy breathing (stridor). °· You start drooling. °· You have trouble swallowing or have pain with swallowing. °· You develop numbness or weakness in your hands or feet. °· You have changes in your speech, understanding, or vision. °· Your pain gets worse. °  °This information is not intended to replace advice given to you by your health care provider. Make sure you discuss any questions you have with your health care provider. °  °Document Released: 08/17/2000 Document Revised:  01/04/2015 Document Reviewed: 08/16/2014 °[employer] Interactive Patient Education ©2016 [employer] ° °

## 2015-07-20 NOTE — Progress Notes (Signed)
Subjective:  Patient ID: [name] [name], female    DOB: 07-30-1941  Age: 74 y.o. MRN: [id number]  CC: Neck Pain   HPI NELL MILBOURNE presents for evaluation of neck pain for several months. She sustained a fall about 2 or 3 months ago and since then is been developing worsening neck pain that she describes as stiffness and soreness. The pain does not radiate into her arms. She does not experience any numbness, tingling, weakness in her arms. She has a plate in her cervical spine after a prior C-spine fusion many years ago. She has been taking ibuprofen and Tylenol without much relief from the symptoms. She was previously taking tramadol for pain but has run out of that. She has pain at night while she is trying to sleep.  Outpatient Prescriptions Prior to Visit  Medication Sig Dispense Refill  . amLODipine (NORVASC) 5 MG tablet TAKE ONE TABLET BY MOUTH ONCE DAILY 30 tablet 10  . Ascorbic Acid (VITAMIN C) 1000 MG tablet Take 1,000 mg by mouth daily.      Marland Kitchen aspirin EC 81 MG tablet Take 81 mg by mouth every morning.     . calcium carbonate (OS-CAL) 600 MG TABS Take 600 mg by mouth 2 (two) times daily with a meal.      . Cholecalciferol (VITAMIN D3) 1000 UNITS CAPS Take 1 capsule by mouth daily.      . Cinnamon 500 MG capsule Take 500 mg by mouth 2 (two) times daily.     . clopidogrel (PLAVIX) 75 MG tablet TAKE ONE TABLET BY MOUTH ONCE DAILY. 30 tablet 6  . dicyclomine (BENTYL) 10 MG capsule Take 10 mg by mouth 3 (three) times daily before meals.    . dicyclomine (BENTYL) 10 MG capsule TAKE ONE CAPSULE BY MOUTH THREE TIMES DAILY 90 capsule 11  . furosemide (LASIX) 20 MG tablet TAKE ONE TABLET BY MOUTH ONCE DAILY 90 tablet 1  . gabapentin (NEURONTIN) 300 MG capsule TAKE ONE CAPSULE BY MOUTH TWICE DAILY 180 capsule 1  . losartan (COZAAR) 100 MG tablet Take 100 mg by mouth daily.    . metFORMIN (GLUCOPHAGE) 500 MG tablet TAKE ONE TABLET BY MOUTH TWICE DAILY 60 tablet 11  . metoprolol (LOPRESSOR)  100 MG tablet TAKE ONE TABLET BY MOUTH TWICE DAILY 60 tablet 11  . mometasone (NASONEX) 50 MCG/ACT nasal spray Place 4 sprays into the nose daily. 17 g 12  . nitroGLYCERIN (NITROSTAT) 0.4 MG SL tablet Place 1 tablet (0.4 mg total) under the tongue every 5 (five) minutes as needed. For chest pain 25 tablet 2  . Omega-3 Fatty Acids (FISH OIL) 1000 MG CAPS Take 2 capsules by mouth 4 (four) times daily.     Marland Kitchen omeprazole (PRILOSEC) 20 MG capsule TAKE TWO CAPSULES BY MOUTH ONCE DAILY 180 capsule 1  . pravastatin (PRAVACHOL) 40 MG tablet TAKE ONE TABLET BY MOUTH IN THE EVENING 90 tablet 3  . psyllium (REGULOID) 0.52 G capsule Take 0.52 g by mouth 2 (two) times daily.      . Red Yeast Rice 600 MG CAPS Take 1 capsule by mouth 2 (two) times daily.      . tizanidine (ZANAFLEX) 2 MG capsule Take 1 capsule (2 mg total) by mouth 3 (three) times daily. 90 capsule 3  . venlafaxine XR (EFFEXOR-XR) 37.5 MG 24 hr capsule TAKE ONE CAPSULE BY MOUTH ONCE DAILY WITH  BREAKFAST 30 capsule 2  . Cod Liver Oil 1000 MG CAPS Take 1  capsule by mouth 2 (two) times daily.      . Coenzyme Q10 (CO Q 10) 100 MG CAPS Take 1 capsule by mouth daily.      . Flaxseed, Linseed, 1000 MG CAPS Take 1 capsule by mouth 2 (two) times daily.     . Glucosamine-Chondroit-Vit C-Mn (GLUCOSAMINE CHONDR 1500 COMPLX PO) Take 1 capsule by mouth 2 (two) times daily.      . metFORMIN (GLUCOPHAGE) 500 MG tablet Take 500 mg by mouth 2 (two) times daily with a meal.     . Multiple Vitamin (STRESSTABS PO) Take 1 tablet by mouth at bedtime.      . niacin (NIASPAN) 500 MG CR tablet Take 1,000 mg by mouth at bedtime. Taking Flush Free only    . pravastatin (PRAVACHOL) 40 MG tablet Take 40 mg by mouth every evening.    . traMADol (ULTRAM) 50 MG tablet Take 1 tablet (50 mg total) by mouth every 6 (six) hours as needed. 15 tablet 0   No facility-administered medications prior to visit.    ROS Review of Systems  Constitutional: Negative.  Negative for fever,  chills, diaphoresis, appetite change and fatigue.  HENT: Negative.  Negative for sore throat, trouble swallowing and voice change.   Eyes: Negative.   Respiratory: Negative.  Negative for cough, choking, chest tightness, shortness of breath and stridor.   Cardiovascular: Negative.  Negative for chest pain, palpitations and leg swelling.  Gastrointestinal: Negative.  Negative for nausea, vomiting, abdominal pain, diarrhea, constipation and blood in stool.  Endocrine: Negative.   Genitourinary: Negative.   Musculoskeletal: Positive for neck pain and neck stiffness. Negative for myalgias, back pain, joint swelling, arthralgias and gait problem.  Allergic/Immunologic: Negative.   Neurological: Negative.   Hematological: Negative.  Negative for adenopathy. Does not bruise/bleed easily.  Psychiatric/Behavioral: Negative.     Objective:  BP 136/86 mmHg  Pulse 78  Temp(Src) 97.9 F (36.6 C) (Oral)  Resp 18  Ht 5\' 1"  (1.549 m)  Wt 126 lb (57.153 kg)  BMI 23.82 kg/m2  SpO2 93%  BP Readings from Last 3 Encounters:  07/20/15 136/86  03/10/15 140/80  11/09/14 138/62    Wt Readings from Last 3 Encounters:  07/20/15 126 lb (57.153 kg)  03/10/15 130 lb 12 oz (59.308 kg)  11/09/14 131 lb (59.421 kg)    Physical Exam  Constitutional: She is oriented to person, place, and time. No distress.  HENT:  Head: Normocephalic and atraumatic.  Mouth/Throat: Oropharynx is clear and moist. No oropharyngeal exudate.  Eyes: Conjunctivae are normal. Right eye exhibits no discharge. Left eye exhibits no discharge. No scleral icterus.  Neck: Normal range of motion. Neck supple. No JVD present. No tracheal deviation present. No thyromegaly present.  Cardiovascular: Normal rate, regular rhythm, normal heart sounds and intact distal pulses.  Exam reveals no gallop and no friction rub.   No murmur heard. Pulmonary/Chest: Effort normal and breath sounds normal. No stridor. No respiratory distress. She has no  wheezes. She has no rales. She exhibits no tenderness.  Abdominal: Soft. Bowel sounds are normal. She exhibits no distension and no mass. There is no tenderness. There is no rebound and no guarding.  Musculoskeletal: She exhibits no edema or tenderness.       Cervical back: She exhibits decreased range of motion. She exhibits no tenderness, no bony tenderness, no swelling, no edema, no deformity, no laceration, no pain, no spasm and normal pulse.  Lymphadenopathy:    She has no cervical adenopathy.  Neurological: She is alert and oriented to person, place, and time. She has normal strength. She displays no atrophy, no tremor and normal reflexes. No cranial nerve deficit or sensory deficit. She exhibits normal muscle tone. She displays a negative Romberg sign. She displays no seizure activity. Coordination and gait normal. She displays no Babinski's sign on the right side. She displays no Babinski's sign on the left side.  Reflex Scores:      Tricep reflexes are 1+ on the right side and 1+ on the left side.      Bicep reflexes are 1+ on the right side and 1+ on the left side.      Brachioradialis reflexes are 1+ on the right side and 1+ on the left side.      Patellar reflexes are 1+ on the right side and 1+ on the left side.      Achilles reflexes are 0 on the right side and 0 on the left side. Skin: Skin is warm and dry. No rash noted. She is not diaphoretic. No erythema. No pallor.  Vitals reviewed.   Lab Results  Component Value Date   WBC 9.3 03/10/2015   HGB 14.0 03/10/2015   HCT 41.4 03/10/2015   PLT 338.0 03/10/2015   GLUCOSE 135* 03/10/2015   CHOL 149 03/10/2015   TRIG 225.0* 03/10/2015   HDL 41.20 03/10/2015   LDLDIRECT 68.0 03/10/2015   LDLCALC 85 01/20/2014   ALT 27 03/10/2015   AST 20 03/10/2015   NA 136 03/10/2015   K 4.7 03/10/2015   CL 97 03/10/2015   CREATININE 0.81 03/10/2015   BUN 12 03/10/2015   CO2 33* 03/10/2015   TSH 1.76 03/10/2015   INR 1.54* 09/28/2011     HGBA1C 6.4 03/10/2015   MICROALBUR 1.2 11/08/2014    Dg Chest 2 View  06/17/2014  CLINICAL DATA:  Cough x3 days. EXAM: CHEST  2 VIEW COMPARISON:  01/20/2014. FINDINGS: Mediastinum and hilar structures normal. Lungs are clear. Heart size normal. No pleural effusion or pneumothorax. Prior cervical spine fusion. Surgical clips right upper quadrant. IMPRESSION: No active cardiopulmonary disease. Electronically Signed   By: Marcello Moores  Register   On: 06/17/2014 11:49    Assessment & Plan:   Nikolle was seen today for neck pain.  Diagnoses and all orders for this visit:  Need for prophylactic vaccination and inoculation against influenza -     Flu vaccine HIGH DOSE PF (Fluzone High dose)  Bilateral posterior neck pain- the x-rays of her cervical spine shows the hardware is intact and undamaged. There is no acute cervical fracture. She will continue Tylenol and ibuprofen and will also add tramadol for additional symptom relief. -     DG Cervical Spine Complete; Future -     traMADol (ULTRAM) 50 MG tablet; Take 1 tablet (50 mg total) by mouth every 6 (six) hours as needed.   I have discontinued Ms. Poinsett's Glucosamine-Chondroit-Vit C-Mn (GLUCOSAMINE CHONDR 1500 COMPLX PO), Flaxseed (Linseed), Co Q 10, Multiple Vitamin (STRESSTABS PO), Cod Liver Oil, and niacin. I am also having her maintain her Cinnamon, calcium carbonate, Red Yeast Rice, Fish Oil, Vitamin D3, psyllium, vitamin C, aspirin EC, dicyclomine, losartan, mometasone, tizanidine, nitroGLYCERIN, metoprolol, amLODipine, clopidogrel, pravastatin, metFORMIN, venlafaxine XR, dicyclomine, gabapentin, omeprazole, furosemide, and traMADol.  Meds ordered this encounter  Medications  . traMADol (ULTRAM) 50 MG tablet    Sig: Take 1 tablet (50 mg total) by mouth every 6 (six) hours as needed.    Dispense:  90 tablet  Refill:  3     Follow-up: Return in about 3 months (around 10/20/2015).  [name], MD

## 2015-08-02 ENCOUNTER — Other Ambulatory Visit: Payer: Self-pay | Admitting: Internal Medicine

## 2015-10-06 ENCOUNTER — Other Ambulatory Visit: Payer: Self-pay | Admitting: Cardiovascular Disease

## 2015-10-20 ENCOUNTER — Ambulatory Visit (INDEPENDENT_AMBULATORY_CARE_PROVIDER_SITE_OTHER): Payer: PPO | Admitting: Internal Medicine

## 2015-10-20 ENCOUNTER — Encounter: Payer: Self-pay | Admitting: Internal Medicine

## 2015-10-20 ENCOUNTER — Other Ambulatory Visit (INDEPENDENT_AMBULATORY_CARE_PROVIDER_SITE_OTHER): Payer: PPO

## 2015-10-20 VITALS — BP 120/68 | HR 60 | Temp 98.0°F | Resp 16 | Ht 61.0 in | Wt 124.0 lb

## 2015-10-20 DIAGNOSIS — E118 Type 2 diabetes mellitus with unspecified complications: Secondary | ICD-10-CM

## 2015-10-20 DIAGNOSIS — E785 Hyperlipidemia, unspecified: Secondary | ICD-10-CM

## 2015-10-20 DIAGNOSIS — I2510 Atherosclerotic heart disease of native coronary artery without angina pectoris: Secondary | ICD-10-CM | POA: Diagnosis not present

## 2015-10-20 DIAGNOSIS — M542 Cervicalgia: Secondary | ICD-10-CM

## 2015-10-20 DIAGNOSIS — I10 Essential (primary) hypertension: Secondary | ICD-10-CM

## 2015-10-20 LAB — LIPID PANEL
CHOL/HDL RATIO: 3
Cholesterol: 162 mg/dL (ref 0–200)
HDL: 51 mg/dL (ref >39.00)
LDL CALC: 87 mg/dL (ref 0–99)
NonHDL: 110.81
TRIGLYCERIDES: 119 mg/dL (ref 0.0–149.0)
VLDL: 23.8 mg/dL (ref 0.0–40.0)

## 2015-10-20 LAB — CBC WITH DIFFERENTIAL/PLATELET
BASOS ABS: 0.1 10*3/uL (ref 0.0–0.1)
Basophils Relative: 1.4 % (ref 0.0–3.0)
Eosinophils Absolute: 0.2 10*3/uL (ref 0.0–0.7)
Eosinophils Relative: 2.4 % (ref 0.0–5.0)
HCT: 40.6 % (ref 36.0–46.0)
Hemoglobin: 13.6 g/dL (ref 12.0–15.0)
LYMPHS ABS: 3 10*3/uL (ref 0.7–4.0)
LYMPHS PCT: 39.6 % (ref 12.0–46.0)
MCHC: 33.5 g/dL (ref 30.0–36.0)
MCV: 93.3 fl (ref 78.0–100.0)
MONOS PCT: 9.3 % (ref 3.0–12.0)
Monocytes Absolute: 0.7 10*3/uL (ref 0.1–1.0)
NEUTROS PCT: 47.3 % (ref 43.0–77.0)
Neutro Abs: 3.6 10*3/uL (ref 1.4–7.7)
Platelets: 297 10*3/uL (ref 150.0–400.0)
RBC: 4.35 Mil/uL (ref 3.87–5.11)
RDW: 13.5 % (ref 11.5–15.5)
WBC: 7.6 10*3/uL (ref 4.0–10.5)

## 2015-10-20 LAB — URINALYSIS, ROUTINE W REFLEX MICROSCOPIC
BILIRUBIN URINE: NEGATIVE
HGB URINE DIPSTICK: NEGATIVE
KETONES UR: NEGATIVE
NITRITE: NEGATIVE
RBC / HPF: NONE SEEN (ref 0–?)
SPECIFIC GRAVITY, URINE: 1.01 (ref 1.000–1.030)
Total Protein, Urine: NEGATIVE
URINE GLUCOSE: NEGATIVE
UROBILINOGEN UA: 0.2 (ref 0.0–1.0)
pH: 6 (ref 5.0–8.0)

## 2015-10-20 LAB — MICROALBUMIN / CREATININE URINE RATIO
Creatinine,U: 100 mg/dL
MICROALB UR: 1 mg/dL (ref 0.0–1.9)
Microalb Creat Ratio: 1 mg/g (ref 0.0–30.0)

## 2015-10-20 LAB — BASIC METABOLIC PANEL
BUN: 18 mg/dL (ref 6–23)
CHLORIDE: 98 meq/L (ref 96–112)
CO2: 36 mEq/L — ABNORMAL HIGH (ref 19–32)
CREATININE: 0.81 mg/dL (ref 0.40–1.20)
Calcium: 9.3 mg/dL (ref 8.4–10.5)
GFR: 73.07 mL/min (ref >60.00)
Glucose, Bld: 128 mg/dL — ABNORMAL HIGH (ref 70–99)
POTASSIUM: 4 meq/L (ref 3.5–5.1)
SODIUM: 139 meq/L (ref 135–145)

## 2015-10-20 LAB — HEMOGLOBIN A1C: Hgb A1c MFr Bld: 6.1 % (ref 4.6–6.5)

## 2015-10-20 LAB — TSH: TSH: 2.8 u[IU]/mL (ref 0.35–4.50)

## 2015-10-20 MED ORDER — LOSARTAN POTASSIUM 100 MG PO TABS: 100.0000 mg | ORAL_TABLET | Freq: Every day | ORAL | Status: DC

## 2015-10-20 NOTE — Progress Notes (Signed)
Subjective:  Patient ID: [name] [name], female    DOB: 08-10-1941  Age: 75 y.o. MRN: [id number]  CC: Hypertension; Hyperlipidemia; Coronary Artery Disease; and Diabetes   HPI [name] [name] presents for f/up on several medical issues. She has an occasional episode of posterior neck pain for several years that resolves with tylenol and tramadol. She otherwise feels well with no episodes of CP, DOE, or SOB.  Outpatient Prescriptions Prior to Visit  Medication Sig Dispense Refill  . amLODipine (NORVASC) 5 MG tablet TAKE ONE TABLET BY MOUTH ONCE DAILY 30 tablet 10  . Ascorbic Acid (VITAMIN C) 1000 MG tablet Take 1,000 mg by mouth daily.      Marland Kitchen aspirin EC 81 MG tablet Take 81 mg by mouth every morning.     . calcium carbonate (OS-CAL) 600 MG TABS Take 600 mg by mouth 2 (two) times daily with a meal.      . Cholecalciferol (VITAMIN D3) 1000 UNITS CAPS Take 1 capsule by mouth daily.      . Cinnamon 500 MG capsule Take 500 mg by mouth 2 (two) times daily.     . clopidogrel (PLAVIX) 75 MG tablet TAKE ONE TABLET BY MOUTH ONCE DAILY 30 tablet 3  . dicyclomine (BENTYL) 10 MG capsule Take 10 mg by mouth 3 (three) times daily before meals.    . dicyclomine (BENTYL) 10 MG capsule TAKE ONE CAPSULE BY MOUTH THREE TIMES DAILY 90 capsule 11  . furosemide (LASIX) 20 MG tablet TAKE ONE TABLET BY MOUTH ONCE DAILY 90 tablet 1  . gabapentin (NEURONTIN) 300 MG capsule TAKE ONE CAPSULE BY MOUTH TWICE DAILY 180 capsule 1  . metFORMIN (GLUCOPHAGE) 500 MG tablet TAKE ONE TABLET BY MOUTH TWICE DAILY 60 tablet 11  . metoprolol (LOPRESSOR) 100 MG tablet TAKE ONE TABLET BY MOUTH TWICE DAILY 60 tablet 11  . mometasone (NASONEX) 50 MCG/ACT nasal spray Place 4 sprays into the nose daily. 17 g 12  . nitroGLYCERIN (NITROSTAT) 0.4 MG SL tablet Place 1 tablet (0.4 mg total) under the tongue every 5 (five) minutes as needed. For chest pain 25 tablet 2  . Omega-3 Fatty Acids (FISH OIL) 1000 MG CAPS Take 2 capsules by mouth 4  (four) times daily.     Marland Kitchen omeprazole (PRILOSEC) 20 MG capsule TAKE TWO CAPSULES BY MOUTH ONCE DAILY 180 capsule 1  . pravastatin (PRAVACHOL) 40 MG tablet TAKE ONE TABLET BY MOUTH IN THE EVENING 90 tablet 3  . psyllium (REGULOID) 0.52 G capsule Take 0.52 g by mouth 2 (two) times daily.      . Red Yeast Rice 600 MG CAPS Take 1 capsule by mouth 2 (two) times daily.      . tizanidine (ZANAFLEX) 2 MG capsule Take 1 capsule (2 mg total) by mouth 3 (three) times daily. 90 capsule 3  . traMADol (ULTRAM) 50 MG tablet Take 1 tablet (50 mg total) by mouth every 6 (six) hours as needed. 90 tablet 3  . venlafaxine XR (EFFEXOR-XR) 37.5 MG 24 hr capsule TAKE ONE CAPSULE BY MOUTH ONCE DAILY WITH BREAKFAST 30 capsule 11  . losartan (COZAAR) 100 MG tablet Take 100 mg by mouth daily.     No facility-administered medications prior to visit.    ROS Review of Systems  Constitutional: Negative.  Negative for fever, chills, diaphoresis, appetite change and fatigue.  HENT: Negative.   Eyes: Negative.   Respiratory: Negative.  Negative for cough, choking, chest tightness, shortness of breath and stridor.  Cardiovascular: Negative.  Negative for chest pain, palpitations and leg swelling.  Gastrointestinal: Negative.  Negative for nausea, vomiting, abdominal pain, diarrhea, constipation and blood in stool.  Endocrine: Negative.   Genitourinary: Negative.   Musculoskeletal: Positive for neck pain. Negative for myalgias, back pain, arthralgias and neck stiffness.  Skin: Negative.  Negative for color change and pallor.  Allergic/Immunologic: Negative.   Neurological: Negative.  Negative for dizziness, tremors, weakness, light-headedness, numbness and headaches.  Hematological: Negative.  Negative for adenopathy. Does not bruise/bleed easily.  Psychiatric/Behavioral: Negative.     Objective:  BP 120/68 mmHg  Pulse 60  Temp(Src) 98 F (36.7 C) (Oral)  Resp 16  Ht 5\' 1"  (1.549 m)  Wt 124 lb (56.246 kg)  BMI  23.44 kg/m2  SpO2 95%  BP Readings from Last 3 Encounters:  10/20/15 120/68  07/20/15 136/86  03/10/15 140/80    Wt Readings from Last 3 Encounters:  10/20/15 124 lb (56.246 kg)  07/20/15 126 lb (57.153 kg)  03/10/15 130 lb 12 oz (59.308 kg)    Physical Exam  Constitutional: She is oriented to person, place, and time. She appears well-developed and well-nourished. No distress.  HENT:  Mouth/Throat: Oropharynx is clear and moist. No oropharyngeal exudate.  Eyes: Conjunctivae are normal. Right eye exhibits no discharge. Left eye exhibits no discharge. No scleral icterus.  Neck: Normal range of motion. Neck supple. No JVD present. No tracheal deviation present. No thyromegaly present.  Cardiovascular: Normal rate, regular rhythm, normal heart sounds and intact distal pulses.  Exam reveals no gallop and no friction rub.   No murmur heard. Pulmonary/Chest: Effort normal and breath sounds normal. No stridor. No respiratory distress. She has no wheezes. She has no rales. She exhibits no tenderness.  Abdominal: Soft. Bowel sounds are normal. She exhibits no distension and no mass. There is no tenderness. There is no rebound and no guarding.  Musculoskeletal: Normal range of motion. She exhibits no edema or tenderness.  Lymphadenopathy:    She has no cervical adenopathy.  Neurological: She is oriented to person, place, and time.  Skin: Skin is warm and dry. No rash noted. She is not diaphoretic. No erythema. No pallor.  Vitals reviewed.   Lab Results  Component Value Date   WBC 7.6 10/20/2015   HGB 13.6 10/20/2015   HCT 40.6 10/20/2015   PLT 297.0 10/20/2015   GLUCOSE 128* 10/20/2015   CHOL 162 10/20/2015   TRIG 119.0 10/20/2015   HDL 51.00 10/20/2015   LDLDIRECT 68.0 03/10/2015   LDLCALC 87 10/20/2015   ALT 27 03/10/2015   AST 20 03/10/2015   NA 139 10/20/2015   K 4.0 10/20/2015   CL 98 10/20/2015   CREATININE 0.81 10/20/2015   BUN 18 10/20/2015   CO2 36* 10/20/2015   TSH  2.80 10/20/2015   INR 1.54* 09/28/2011   HGBA1C 6.1 10/20/2015   MICROALBUR 1.0 10/20/2015    Dg Cervical Spine Complete  07/20/2015  CLINICAL DATA:  Status post fall 2 months ago. Bilateral posterior pain. EXAM: CERVICAL SPINE - COMPLETE 4+ VIEW COMPARISON:  07/04/2012 FINDINGS: There is no evidence of cervical spine fracture or prevertebral soft tissue swelling. Alignment is normal. No other significant bone abnormalities are identified. There is anterior cervical fusion from C4 through T1 with hardware at C6-7 and C7-T1. There is solid osseous bridging across the disc spaces. There is posterior spinal fusion from C2 through C4 without failure or complication. There is posterior spinal fusion from C6 through T1 without failure  or complication. IMPRESSION: No acute osseous injury of the cervical spine. Anterior and posterior cervical fusion as detailed above. Electronically Signed   By: Kathreen Devoid   On: 07/20/2015 15:01    Assessment & Plan:   [name] [name] was seen today for hypertension, hyperlipidemia, coronary artery disease and diabetes.  Diagnoses and all orders for this visit:  Atherosclerosis of native coronary artery of native heart without angina pectoris- she is had no recent episodes of chest pain or shortness of breath, I will continue to modify her risk factors with blood pressure control, statin therapy and an aspirin a day. -     losartan (COZAAR) 100 MG tablet; Take 1 tablet (100 mg total) by mouth daily.  Essential hypertension- her blood pressure is well-controlled, electrolytes and renal function are stable. -     losartan (COZAAR) 100 MG tablet; Take 1 tablet (100 mg total) by mouth daily. -     CBC with Differential/Platelet; Future -     Basic metabolic panel; Future -     Urinalysis, Routine w reflex microscopic (not at [facility]); Future -     TSH; Future  Controlled type 2 diabetes mellitus with complication, without long-term current use of insulin ([facility]ee)- A1c reveals  that her blood sugars are well-controlled -     losartan (COZAAR) 100 MG tablet; Take 1 tablet (100 mg total) by mouth daily. -     Basic metabolic panel; Future -     Hemoglobin A1c; Future -     Microalbumin / creatinine urine ratio; Future  Hyperlipidemia with target LDL less than 100- she has achieved her LDL goal is doing well on the statin, will continue -     Lipid panel; Future -     TSH; Future  Bilateral posterior neck pain- we'll continue tramadol and Tylenol as needed.   I have changed Ms. Colla's losartan. I am also having her maintain her Cinnamon, calcium carbonate, Red Yeast Rice, Fish Oil, Vitamin D3, psyllium, vitamin C, aspirin EC, dicyclomine, mometasone, tizanidine, nitroGLYCERIN, metoprolol, amLODipine, pravastatin, metFORMIN, dicyclomine, gabapentin, omeprazole, furosemide, traMADol, venlafaxine XR, and clopidogrel.  Meds ordered this encounter  Medications  . losartan (COZAAR) 100 MG tablet    Sig: Take 1 tablet (100 mg total) by mouth daily.    Dispense:  90 tablet    Refill:  1     Follow-up: Return in about 6 months (around 04/18/2016).  [name], MD

## 2015-10-20 NOTE — Progress Notes (Signed)
Pre visit review using our clinic review tool, if applicable. No additional management support is needed unless otherwise documented below in the visit note. 

## 2015-10-20 NOTE — Patient Instructions (Signed)

## 2016-01-03 ENCOUNTER — Other Ambulatory Visit: Payer: Self-pay | Admitting: Cardiovascular Disease

## 2016-01-05 ENCOUNTER — Encounter: Payer: Self-pay | Admitting: Internal Medicine

## 2016-01-12 ENCOUNTER — Encounter: Payer: Self-pay | Admitting: Cardiovascular Disease

## 2016-01-12 ENCOUNTER — Ambulatory Visit (INDEPENDENT_AMBULATORY_CARE_PROVIDER_SITE_OTHER): Payer: PPO | Admitting: Cardiovascular Disease

## 2016-01-12 VITALS — BP 130/70 | HR 74 | Ht 61.0 in | Wt 123.6 lb

## 2016-01-12 DIAGNOSIS — I10 Essential (primary) hypertension: Secondary | ICD-10-CM | POA: Diagnosis not present

## 2016-01-12 DIAGNOSIS — I25118 Atherosclerotic heart disease of native coronary artery with other forms of angina pectoris: Secondary | ICD-10-CM

## 2016-01-12 MED ORDER — METOPROLOL TARTRATE 100 MG PO TABS: 100.0000 mg | ORAL_TABLET | Freq: Two times a day (BID) | ORAL | Status: DC

## 2016-01-12 NOTE — Progress Notes (Signed)
Cardiology Office Note Date:  01/14/2016   ID:  [name] [name], DOB 10/17/1940, MRN [id number]  PCP:  [name], MD  Cardiologist:  Sherren Mocha, MD    Chief Complaint  Patient presents with  . Coronary Artery Disease    no cp,sob,,claudication. has some swelling in LEFT leg  . essential hypertension  . Hyperlipidemia     History of Present Illness: [name] [name] is a 75 y.o. female who presents for follow-up of evaluation.   The patient has a history of coronary artery disease with initial presentation in 2007 when she had an inferior wall STEMI. She was treated with primary PCI using a drug-eluting stent in the right coronary artery. LV function has been within normal limits. She underwent repeat heart catheterization in 2010 which showed moderate LAD stenosis stable from her previous study, patency of her RCA stent site, and a left ventricular ejection fraction of 65%. A followup Myoview scan in 2013 was done and showed no ischemia.  The patient is doing pretty well. [name] here with her husband today. He's developed dementia and she cares for him. She has occasional episodes of chest discomfort and neck pain. These are rare and unchanged over time. There is no association with physical activity. She denies shortness of breath,  lightheadedness, or syncope. She admits to occasional heart palpitations that she's had for many years.  Past Medical History  Diagnosis Date  . HTN (hypertension)   . CAD (coronary artery disease)     1.  inf MI 2007 - tx with DES to RCA;  2.  Echocardiogram 08/2007: EF 60%, mild MR, mild LAE.  3.  Last LHC 08/2009: dLM 20%, LAD beyond diagonal 60-70%, stable from prior catheterization in 2007, ostial circumflex 40-50%, mid RCA stent okay, EF 65%. ;  4.  myoview was recommended 08/2009: EF 76%, small inferoapical fixed defect, no ischemia  5.  Myoview 7/13: no scar or ischemia, EF 69%  . Premature ventricular contractions   . Cerebrovascular  disease, unspecified   . Mixed hyperlipidemia   . DM type 2 (diabetes mellitus, type 2) ([place])   . GERD (gastroesophageal reflux disease)   . Diverticular disease   . IBS (irritable bowel syndrome)   . Urinary incontinence   . DJD (degenerative joint disease)   . Chronic neck pain   . Chronic pain syndrome   . Dizziness   . Anxiety   . 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 H/O hiatal hernia   . Nocturia   . Chronic kidney disease     frequent Kidney Infections  . Pancreatitis     1955 an once more  . PONV (postoperative nausea and vomiting)     Difficluty opening mouth wide and turning head. (Cervical Fusion)  . Mitral valve prolapse     Past Surgical History  Procedure Laterality Date  . Cervical spine surgery  07/2005    Dr Consuello Masse  . Bladder repair  2007  . Total abdominal hysterectomy    . Tonsillectomy    . Thumb surgery    . Neck surgery      has had 3 surgeries  . Temporomandibular joint surgery    . Left total knee replacement  11/2010    Dr Percell Miller  . I&d knee with poly exchange  09/25/2011    Procedure: IRRIGATION AND DEBRIDEMENT KNEE WITH POLY EXCHANGE;  Surgeon: Johnny Bridge, MD;  Location: [facility];  Service: Orthopedics;  Laterality: Left;  .  Cardiac catheterization  2007    Stents  . Appendectomy  1955  . Tonsillectomy  1950  . Cholecystectomy  2007  . Eye surgery  2011    Bilteral  . Joint replacement  2012    left  . Incision and drainage abscess / hematoma of bursa / knee / thigh  09/2011  . Posterior cervical fusion/foraminotomy  04/08/2012    Procedure: POSTERIOR CERVICAL FUSION/FORAMINOTOMY LEVEL 2;  Surgeon: Otilio Connors, MD;  Location: [facility];  Service: Neurosurgery;  Laterality: Left;  Left Cervical six-seven Foraminotomy, bilateral cervical seven-thoracic one foraminotomy, cervical six-seven, cervical seven-thoracic one fusion with posterior instrumentation  . Dental surgery  05/2013    replaced an inplant    Current  Outpatient Prescriptions  Medication Sig Dispense Refill  . amLODipine (NORVASC) 5 MG tablet TAKE ONE TABLET BY MOUTH ONCE DAILY 30 tablet 10  . Ascorbic Acid (VITAMIN C) 1000 MG tablet Take 1,000 mg by mouth daily.      Marland Kitchen aspirin EC 81 MG tablet Take 81 mg by mouth every morning.     . calcium carbonate (OS-CAL) 600 MG TABS Take 600 mg by mouth 2 (two) times daily with a meal.      . Cholecalciferol (VITAMIN D3) 1000 UNITS CAPS Take 1 capsule by mouth daily.      . Cinnamon 500 MG capsule Take 500 mg by mouth 2 (two) times daily.     . clopidogrel (PLAVIX) 75 MG tablet TAKE ONE TABLET BY MOUTH ONCE DAILY 30 tablet 3  . dicyclomine (BENTYL) 10 MG capsule TAKE ONE CAPSULE BY MOUTH THREE TIMES DAILY 90 capsule 11  . furosemide (LASIX) 20 MG tablet TAKE ONE TABLET BY MOUTH ONCE DAILY 90 tablet 1  . gabapentin (NEURONTIN) 300 MG capsule TAKE ONE CAPSULE BY MOUTH TWICE DAILY 180 capsule 1  . losartan (COZAAR) 100 MG tablet Take 1 tablet (100 mg total) by mouth daily. 90 tablet 1  . metFORMIN (GLUCOPHAGE) 500 MG tablet TAKE ONE TABLET BY MOUTH TWICE DAILY 60 tablet 11  . metoprolol (LOPRESSOR) 100 MG tablet Take 1 tablet (100 mg total) by mouth 2 (two) times daily. 60 tablet 11  . mometasone (NASONEX) 50 MCG/ACT nasal spray Place 4 sprays into the nose daily. 17 g 12  . nitroGLYCERIN (NITROSTAT) 0.4 MG SL tablet Place 1 tablet (0.4 mg total) under the tongue every 5 (five) minutes as needed. For chest pain 25 tablet 2  . Omega-3 Fatty Acids (FISH OIL) 1000 MG CAPS Take 2 capsules by mouth 4 (four) times daily.     Marland Kitchen omeprazole (PRILOSEC) 20 MG capsule TAKE TWO CAPSULES BY MOUTH ONCE DAILY 180 capsule 1  . pravastatin (PRAVACHOL) 40 MG tablet TAKE ONE TABLET BY MOUTH IN THE EVENING 90 tablet 3  . psyllium (REGULOID) 0.52 G capsule Take 0.52 g by mouth 2 (two) times daily.      . Red Yeast Rice 600 MG CAPS Take 1 capsule by mouth 2 (two) times daily.      Marland Kitchen tiZANidine (ZANAFLEX) 2 MG tablet Take 1  tablet by mouth 3 (three) times daily as needed. Muscle spasms    . venlafaxine XR (EFFEXOR-XR) 37.5 MG 24 hr capsule TAKE ONE CAPSULE BY MOUTH ONCE DAILY WITH BREAKFAST 30 capsule 11  . [DISCONTINUED] enoxaparin (LOVENOX) 40 MG/0.4ML SOLN Inject 0.4 mLs (40 mg total) into the skin daily. 5 Syringe 0   No current facility-administered medications for this visit.    Allergies:  Niacin and related; Codeine; Prednisone; Sulfonamide derivatives; and Tetracycline   Social History:  The patient  reports that she has never smoked. She has never used smokeless tobacco. She reports that she does not drink alcohol or use illicit drugs.   Family History:  The patient's  family history includes Coronary artery disease in her other and other; Heart disease in her mother.    ROS:  Please see the history of present illness.  Otherwise, review of systems is positive for easy bruising.  All other systems are reviewed and negative.    PHYSICAL EXAM: VS:  BP 130/70 mmHg  Pulse 74  Ht 5\' 1"  (1.549 m)  Wt 123 lb 9.6 oz (56.065 kg)  BMI 23.37 kg/m2 , BMI Body mass index is 23.37 kg/(m^2). GEN: Well nourished, well developed, in no acute distress HEENT: normal Neck: no JVD, no masses. No carotid bruits Cardiac: RRR without murmur or gallop                Respiratory:  clear to auscultation bilaterally, normal work of breathing GI: soft, nontender, nondistended, + BS MS: no deformity or atrophy Ext: no pretibial edema, pedal pulses 2+= bilaterally Skin: warm and dry, no rash Neuro:  Strength and sensation are intact Psych: euthymic mood, full affect  EKG:  EKG is ordered today. The ekg ordered today shows normal sinus rhythm 77 bpm, age indeterminate inferior MI  Recent Labs: 03/10/2015: ALT 27 10/20/2015: BUN 18; Creatinine, Ser 0.81; Hemoglobin 13.6; Platelets 297.0; Potassium 4.0; Sodium 139; TSH 2.80   Lipid Panel     Component Value Date/Time   CHOL 162 10/20/2015 1147   TRIG 119.0  10/20/2015 1147   TRIG 164* 06/24/2006 0738   HDL 51.00 10/20/2015 1147   CHOLHDL 3 10/20/2015 1147   CHOLHDL 3.8 CALC 06/24/2006 0738   VLDL 23.8 10/20/2015 1147   LDLCALC 87 10/20/2015 1147   LDLDIRECT 68.0 03/10/2015 1530      Wt Readings from Last 3 Encounters:  01/12/16 123 lb 9.6 oz (56.065 kg)  10/20/15 124 lb (56.246 kg)  07/20/15 126 lb (57.153 kg)    ASSESSMENT AND PLAN: 1.  CAD, native vessel: atypical angina with no change in pattern. The patient appears stable and reports no change in her episodic chest discomfort that is unrelated to physical exertion. Symptoms are always self-limited and relatively fleeting in duration. She does not require nitroglycerin. She will continue on her current medical program and I plan to see her back in one year unless there is any change in symptoms. Overall she feels she is doing well and describes no limitation with physical activity.  2. Essential HTN: BP remains well-controlled on current Rx.   3. Hyperlipidemia: lipids from 10/2015 reviewed with LDL 87 on pravastatin, limited by statin side effects. LFT's ok.  Current medicines are reviewed with the patient today.  The patient does not have concerns regarding medicines.  Labs/ tests ordered today include:   Orders Placed This Encounter  Procedures  . EKG 12-Lead    Disposition:   FU one year  Signed, Sherren Mocha, MD  01/14/2016 9:46 AM    [facility] [address], [place], [place]  [address] Phone: [phone]; Fax: [phone]

## 2016-01-12 NOTE — Patient Instructions (Signed)

## 2016-01-21 ENCOUNTER — Other Ambulatory Visit: Payer: Self-pay | Admitting: Internal Medicine

## 2016-01-21 ENCOUNTER — Other Ambulatory Visit: Payer: Self-pay | Admitting: Cardiovascular Disease

## 2016-02-23 ENCOUNTER — Other Ambulatory Visit: Payer: Self-pay | Admitting: Cardiovascular Disease

## 2016-03-22 ENCOUNTER — Other Ambulatory Visit: Payer: Self-pay | Admitting: Internal Medicine

## 2016-04-12 ENCOUNTER — Other Ambulatory Visit: Payer: Self-pay | Admitting: Internal Medicine

## 2016-04-13 ENCOUNTER — Encounter: Payer: Self-pay | Admitting: *Deleted

## 2016-04-18 ENCOUNTER — Encounter: Payer: Self-pay | Admitting: Internal Medicine

## 2016-04-18 ENCOUNTER — Other Ambulatory Visit (INDEPENDENT_AMBULATORY_CARE_PROVIDER_SITE_OTHER): Payer: PPO

## 2016-04-18 ENCOUNTER — Ambulatory Visit (INDEPENDENT_AMBULATORY_CARE_PROVIDER_SITE_OTHER): Payer: PPO | Admitting: Internal Medicine

## 2016-04-18 VITALS — BP 128/70 | HR 75 | Temp 97.9°F | Ht 61.0 in | Wt 120.0 lb

## 2016-04-18 DIAGNOSIS — I2510 Atherosclerotic heart disease of native coronary artery without angina pectoris: Secondary | ICD-10-CM | POA: Diagnosis not present

## 2016-04-18 DIAGNOSIS — I10 Essential (primary) hypertension: Secondary | ICD-10-CM

## 2016-04-18 DIAGNOSIS — E118 Type 2 diabetes mellitus with unspecified complications: Secondary | ICD-10-CM | POA: Diagnosis not present

## 2016-04-18 DIAGNOSIS — Z0000 Encounter for general adult medical examination without abnormal findings: Secondary | ICD-10-CM

## 2016-04-18 LAB — HEMOGLOBIN A1C: HEMOGLOBIN A1C: 5.8 % (ref 4.6–6.5)

## 2016-04-18 LAB — BASIC METABOLIC PANEL
BUN: 16 mg/dL (ref 6–23)
CALCIUM: 9.3 mg/dL (ref 8.4–10.5)
CO2: 35 mEq/L — ABNORMAL HIGH (ref 19–32)
CREATININE: 0.85 mg/dL (ref 0.40–1.20)
Chloride: 98 mEq/L (ref 96–112)
GFR: 69.03 mL/min (ref >60.00)
GLUCOSE: 126 mg/dL — AB (ref 70–99)
POTASSIUM: 3.9 meq/L (ref 3.5–5.1)
Sodium: 139 mEq/L (ref 135–145)

## 2016-04-18 MED ORDER — LOSARTAN POTASSIUM 100 MG PO TABS: 100.0000 mg | ORAL_TABLET | Freq: Every day | ORAL | 1 refills | Status: DC

## 2016-04-18 NOTE — Patient Instructions (Signed)

## 2016-04-18 NOTE — Progress Notes (Signed)
Pre visit review using our clinic review tool, if applicable. No additional management support is needed unless otherwise documented below in the visit note. 

## 2016-04-18 NOTE — Progress Notes (Signed)
Subjective:  Patient ID: [name] [name], female    DOB: May 27, 1941  Age: 75 y.o. MRN: [id number]  CC: Annual Exam; Hypertension; and Diabetes   HPI [name] [name] presents for a CPX.  She tells me her blood pressure is been well controlled with a combination of losartan, amlodipine, furosemide, and metoprolol. [name] had no recent episodes of headache/blurred vision/chest pain/shortness of breath/or palpitations.  She also tells me her blood sugars been well controlled with metformin. The metformin does not cause any stomach upset such as nausea/vomiting/abdominal pain/cramping.  She is tolerating her statin therapy well with no muscle or joint aches.    Past Medical History:  Diagnosis Date  . Anxiety   . CAD (coronary artery disease)    1.  inf MI 2007 - tx with DES to RCA;  2.  Echocardiogram 08/2007: EF 60%, mild MR, mild LAE.  3.  Last LHC 08/2009: dLM 20%, LAD beyond diagonal 60-70%, stable from prior catheterization in 2007, ostial circumflex 40-50%, mid RCA stent okay, EF 65%. ;  4.  myoview was recommended 08/2009: EF 76%, small inferoapical fixed defect, no ischemia  5.  Myoview 7/13: no scar or ischemia, EF 69%  . Cerebrovascular disease, unspecified   . Chronic kidney disease    frequent Kidney Infections  . Chronic neck pain   . Chronic pain syndrome   . Diverticular disease   . Dizziness   . DJD (degenerative joint disease)   . DM type 2 (diabetes mellitus, type 2) ([place])   . GERD (gastroesophageal reflux disease)   . H/O hiatal hernia   . HTN (hypertension)   . IBS (irritable bowel syndrome)   . 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 Ischemic colitis ([place])   . Mitral valve prolapse   . Mixed hyperlipidemia   . Nocturia   . Pancreatitis    1955 an once more  . PONV (postoperative nausea and vomiting)    Difficluty opening mouth wide and turning head. (Cervical Fusion)  . Premature ventricular contractions   . Tubular adenoma of colon   .  Urinary incontinence    Past Surgical History:  Procedure Laterality Date  . APPENDECTOMY  1955  . BLADDER REPAIR  2007  . CARDIAC CATHETERIZATION  2007   Stents  . CERVICAL SPINE SURGERY  07/2005   Dr Consuello Masse  . CHOLECYSTECTOMY  2007  . DENTAL SURGERY  05/2013   replaced an inplant  . EYE SURGERY  2011   Bilteral  . I&D KNEE WITH POLY EXCHANGE  09/25/2011   Procedure: IRRIGATION AND DEBRIDEMENT KNEE WITH POLY EXCHANGE;  Surgeon: Johnny Bridge, MD;  Location: [facility];  Service: Orthopedics;  Laterality: Left;  . INCISION AND DRAINAGE ABSCESS / HEMATOMA OF BURSA / KNEE / THIGH  09/2011  . JOINT REPLACEMENT  2012   left  . left Total Knee Replacement  11/2010   Dr Percell Miller  . NECK SURGERY     has had 3 surgeries  . POSTERIOR CERVICAL FUSION/FORAMINOTOMY  04/08/2012   Procedure: POSTERIOR CERVICAL FUSION/FORAMINOTOMY LEVEL 2;  Surgeon: Otilio Connors, MD;  Location: [facility];  Service: Neurosurgery;  Laterality: Left;  Left Cervical six-seven Foraminotomy, bilateral cervical seven-thoracic one foraminotomy, cervical six-seven, cervical seven-thoracic one fusion with posterior instrumentation  . TEMPOROMANDIBULAR JOINT SURGERY    . thumb surgery    . TONSILLECTOMY    . TONSILLECTOMY  1950  . TOTAL ABDOMINAL HYSTERECTOMY      reports that  she has never smoked. She has never used smokeless tobacco. She reports that she does not drink alcohol or use drugs. family history includes Coronary artery disease in her other and other; Heart disease in her mother. Allergies  Allergen Reactions  . Niacin And Related Other (See Comments)    Must take "Flush-free"  . Codeine Nausea Only  . Prednisone Itching and Rash  . Sulfonamide Derivatives Itching  . Tetracycline Itching and Rash    Outpatient Medications Prior to Visit  Medication Sig Dispense Refill  . amLODipine (NORVASC) 5 MG tablet TAKE ONE TABLET BY MOUTH ONCE DAILY 30 tablet 11  . Ascorbic Acid (VITAMIN C) 1000 MG tablet Take 1,000  mg by mouth daily.      Marland Kitchen aspirin EC 81 MG tablet Take 81 mg by mouth every morning.     . calcium carbonate (OS-CAL) 600 MG TABS Take 600 mg by mouth 2 (two) times daily with a meal.      . Cholecalciferol (VITAMIN D3) 1000 UNITS CAPS Take 1 capsule by mouth daily.      . Cinnamon 500 MG capsule Take 500 mg by mouth 2 (two) times daily.     . clopidogrel (PLAVIX) 75 MG tablet TAKE ONE TABLET BY MOUTH ONCE DAILY 30 tablet 10  . dicyclomine (BENTYL) 10 MG capsule TAKE ONE CAPSULE BY MOUTH THREE TIMES DAILY 90 capsule 11  . furosemide (LASIX) 20 MG tablet TAKE ONE TABLET BY MOUTH ONCE DAILY 90 tablet 1  . gabapentin (NEURONTIN) 300 MG capsule TAKE ONE CAPSULE BY MOUTH TWICE DAILY 180 capsule 1  . metFORMIN (GLUCOPHAGE) 500 MG tablet TAKE ONE TABLET BY MOUTH TWICE DAILY 60 tablet 11  . metoprolol (LOPRESSOR) 100 MG tablet Take 1 tablet (100 mg total) by mouth 2 (two) times daily. 60 tablet 11  . mometasone (NASONEX) 50 MCG/ACT nasal spray Place 4 sprays into the nose daily. 17 g 12  . nitroGLYCERIN (NITROSTAT) 0.4 MG SL tablet Place 1 tablet (0.4 mg total) under the tongue every 5 (five) minutes as needed. For chest pain 25 tablet 2  . Omega-3 Fatty Acids (FISH OIL) 1000 MG CAPS Take 2 capsules by mouth 4 (four) times daily.     Marland Kitchen omeprazole (PRILOSEC) 20 MG capsule TAKE TWO CAPSULES BY MOUTH ONCE DAILY 180 capsule 1  . pravastatin (PRAVACHOL) 40 MG tablet TAKE ONE TABLET BY MOUTH IN THE EVENING 90 tablet 3  . psyllium (REGULOID) 0.52 G capsule Take 0.52 g by mouth 2 (two) times daily.      . Red Yeast Rice 600 MG CAPS Take 1 capsule by mouth 2 (two) times daily.      Marland Kitchen tiZANidine (ZANAFLEX) 2 MG tablet Take 1 tablet by mouth 3 (three) times daily as needed. Muscle spasms    . venlafaxine XR (EFFEXOR-XR) 37.5 MG 24 hr capsule TAKE ONE CAPSULE BY MOUTH ONCE DAILY WITH BREAKFAST 30 capsule 11  . losartan (COZAAR) 100 MG tablet Take 1 tablet (100 mg total) by mouth daily. 90 tablet 1   No  facility-administered medications prior to visit.     ROS Review of Systems  Constitutional: Negative.  Negative for activity change, chills, diaphoresis, fatigue and unexpected weight change.  HENT: Negative.  Negative for sinus pressure and trouble swallowing.   Eyes: Negative.   Respiratory: Negative.  Negative for cough, choking, chest tightness, shortness of breath and stridor.   Cardiovascular: Negative.  Negative for chest pain, palpitations and leg swelling.  Gastrointestinal: Negative.  Negative for abdominal pain, blood in stool, constipation, diarrhea, nausea and vomiting.  Endocrine: Negative.   Genitourinary: Negative.  Negative for difficulty urinating.  Musculoskeletal: Negative.  Negative for arthralgias, back pain, joint swelling, myalgias and neck pain.  Skin: Negative.  Negative for color change and rash.  Allergic/Immunologic: Negative.   Neurological: Negative.  Negative for dizziness, tremors, syncope, speech difficulty, numbness and headaches.  Hematological: Negative.  Negative for adenopathy. Does not bruise/bleed easily.  Psychiatric/Behavioral: Negative.  Negative for behavioral problems, decreased concentration, dysphoric mood and sleep disturbance. The patient is not nervous/anxious.     Objective:  BP 128/70 (BP Location: Left Arm, Patient Position: Sitting, Cuff Size: Normal)   Pulse 75   Temp 97.9 F (36.6 C) (Oral)   Ht 5\' 1"  (1.549 m)   Wt 120 lb (54.4 kg)   SpO2 92%   BMI 22.67 kg/m   BP Readings from Last 3 Encounters:  04/18/16 128/70  01/12/16 130/70  10/20/15 120/68    Wt Readings from Last 3 Encounters:  04/18/16 120 lb (54.4 kg)  01/12/16 123 lb 9.6 oz (56.1 kg)  10/20/15 124 lb (56.2 kg)    Physical Exam  Constitutional: She is oriented to person, place, and time. She appears well-developed and well-nourished. No distress.  HENT:  Head: Normocephalic and atraumatic.  Mouth/Throat: Oropharynx is clear and moist. No oropharyngeal  exudate.  Eyes: Conjunctivae are normal. Right eye exhibits no discharge. Left eye exhibits no discharge. No scleral icterus.  Neck: Normal range of motion. Neck supple. No JVD present. No tracheal deviation present. No thyromegaly present.  Cardiovascular: Normal rate, regular rhythm and intact distal pulses.  Exam reveals no gallop and no friction rub.   No murmur heard. Pulmonary/Chest: Effort normal and breath sounds normal. No stridor. No respiratory distress. She has no wheezes. She has no rales. She exhibits no tenderness.  Abdominal: Soft. Bowel sounds are normal. She exhibits no distension and no mass. There is no tenderness. There is no rebound and no guarding.  Musculoskeletal: Normal range of motion. She exhibits no edema, tenderness or deformity.  Lymphadenopathy:    She has no cervical adenopathy.  Neurological: She is oriented to person, place, and time.  Skin: Skin is warm and dry. No rash noted. She is not diaphoretic. No erythema. No pallor.  Psychiatric: She has a normal mood and affect. Her behavior is normal. Judgment and thought content normal.  Vitals reviewed.   Lab Results  Component Value Date   WBC 7.6 10/20/2015   HGB 13.6 10/20/2015   HCT 40.6 10/20/2015   PLT 297.0 10/20/2015   GLUCOSE 126 (H) 04/18/2016   CHOL 162 10/20/2015   TRIG 119.0 10/20/2015   HDL 51.00 10/20/2015   LDLDIRECT 68.0 03/10/2015   LDLCALC 87 10/20/2015   ALT 27 03/10/2015   AST 20 03/10/2015   NA 139 04/18/2016   K 3.9 04/18/2016   CL 98 04/18/2016   CREATININE 0.85 04/18/2016   BUN 16 04/18/2016   CO2 35 (H) 04/18/2016   TSH 2.80 10/20/2015   INR 1.54 (H) 09/28/2011   HGBA1C 5.8 04/18/2016   MICROALBUR 1.0 10/20/2015    Dg Cervical Spine Complete  Result Date: 07/20/2015 CLINICAL DATA:  Status post fall 2 months ago. Bilateral posterior pain. EXAM: CERVICAL SPINE - COMPLETE 4+ VIEW COMPARISON:  07/04/2012 FINDINGS: There is no evidence of cervical spine fracture or  prevertebral soft tissue swelling. Alignment is normal. No other significant bone abnormalities are identified. There is anterior  cervical fusion from C4 through T1 with hardware at C6-7 and C7-T1. There is solid osseous bridging across the disc spaces. There is posterior spinal fusion from C2 through C4 without failure or complication. There is posterior spinal fusion from C6 through T1 without failure or complication. IMPRESSION: No acute osseous injury of the cervical spine. Anterior and posterior cervical fusion as detailed above. Electronically Signed   By: Kathreen Devoid   On: 07/20/2015 15:01    Assessment & Plan:   Kayland was seen today for annual exam, hypertension and diabetes.  Diagnoses and all orders for this visit:  Essential hypertension- her blood pressure is well-controlled on the current regimen, her electrolytes and renal function are stable. -     Basic metabolic panel; Future -     losartan (COZAAR) 100 MG tablet; Take 1 tablet (100 mg total) by mouth daily.  Controlled type 2 diabetes mellitus with complication, without long-term current use of insulin ([place])- her A1c is 5.8%, her blood sugars are over controlled, will discontinue the metformin. -     Basic metabolic panel; Future -     Hemoglobin A1c; Future -     losartan (COZAAR) 100 MG tablet; Take 1 tablet (100 mg total) by mouth daily.  Atherosclerosis of native coronary artery of native heart without angina pectoris- she has had no recent episodes of angina, will continue risk factor modification with good blood pressure and blood sugar control, statin therapy, clopidogrel, and an aspirin a day. -     losartan (COZAAR) 100 MG tablet; Take 1 tablet (100 mg total) by mouth daily.  Routine general medical examination at a health care facility   I am having [name] [name] maintain her Cinnamon, calcium carbonate, Red Yeast Rice, Fish Oil, Vitamin D3, psyllium, vitamin C, aspirin EC, mometasone, nitroGLYCERIN, dicyclomine,  venlafaxine XR, tiZANidine, metoprolol, furosemide, amLODipine, gabapentin, clopidogrel, omeprazole, pravastatin, metFORMIN, and losartan.  Meds ordered this encounter  Medications  . losartan (COZAAR) 100 MG tablet    Sig: Take 1 tablet (100 mg total) by mouth daily.    Dispense:  90 tablet    Refill:  1   See AVS for instructions about healthy living and anticipatory guidance.  Follow-up: Return in about 6 months (around 10/19/2016).  [name], MD

## 2016-04-22 NOTE — Assessment & Plan Note (Signed)

## 2016-05-01 ENCOUNTER — Encounter: Payer: Self-pay | Admitting: Internal Medicine

## 2016-05-01 ENCOUNTER — Ambulatory Visit (INDEPENDENT_AMBULATORY_CARE_PROVIDER_SITE_OTHER): Payer: PPO | Admitting: Internal Medicine

## 2016-05-01 ENCOUNTER — Telehealth: Payer: Self-pay | Admitting: *Deleted

## 2016-05-01 VITALS — BP 124/60 | HR 64 | Ht 61.0 in | Wt 119.0 lb

## 2016-05-01 DIAGNOSIS — R197 Diarrhea, unspecified: Secondary | ICD-10-CM | POA: Diagnosis not present

## 2016-05-01 DIAGNOSIS — K559 Vascular disorder of intestine, unspecified: Secondary | ICD-10-CM | POA: Diagnosis not present

## 2016-05-01 DIAGNOSIS — R1030 Lower abdominal pain, unspecified: Secondary | ICD-10-CM

## 2016-05-01 DIAGNOSIS — Z86010 Personal history of colonic polyps: Secondary | ICD-10-CM | POA: Diagnosis not present

## 2016-05-01 MED ORDER — NA SULFATE-K SULFATE-MG SULF 17.5-3.13-1.6 GM/177ML PO SOLN: ORAL | 0 refills | Status: DC

## 2016-05-01 MED ORDER — DICYCLOMINE HCL 20 MG PO TABS: 20.0000 mg | ORAL_TABLET | Freq: Two times a day (BID) | ORAL | 2 refills | Status: DC

## 2016-05-01 NOTE — Telephone Encounter (Signed)
05/01/2016  RE: [name] [name] DOB: 23-Oct-1940 MRN: [id number]  Dear Dr Burt Knack,   We have scheduled the above patient for a colonoscopy procedure. Our records show that she is on anticoagulation therapy.   Please advise as to whether the patient may come off her therapy of Plavix 5 days prior to the procedure, which is scheduled for 06/28/16.  Please route your response to Dixon Boos, CMA.  Sincerely,  Dixon Boos

## 2016-05-01 NOTE — Patient Instructions (Signed)
You have been scheduled for a colonoscopy. Please follow written instructions given to you at your visit today.  Please pick up your prep supplies at the pharmacy within the next 1-3 days. If you use inhalers (even only as needed), please bring them with you on the day of your procedure. Your physician has requested that you go to www.startemmi.com and enter the access code given to you at your visit today. This web site gives a general overview about your procedure. However, you should still follow specific instructions given to you by our office regarding your preparation for the procedure.  Your physician has requested that you go to the basement for the following lab work before leaving today: Calprotectin, elastase, c diff pcr, stool culture  Continue prilosec 20 mg daily.  Increase bentyl to 20 mg twice daily. (we have sent a new prescription to pharmacy).  You will be contacted by our office prior to your procedure for directions on holding your Plavix.  If you do not hear from our office 1 week prior to your scheduled procedure, please call [phone] to discuss.   If you are age 40 or older, your body mass index should be between 23-30. Your Body mass index is 22.48 kg/m. If this is out of the aforementioned range listed, please consider follow up with your Primary Care Provider.  If you are age 32 or younger, your body mass index should be between 19-25. Your Body mass index is 22.48 kg/m. If this is out of the aformentioned range listed, please consider follow up with your Primary Care Provider.

## 2016-05-02 NOTE — Progress Notes (Signed)
Subjective:    Patient ID: [name] [name], female    DOB: [birth year], 75 y.o.   MRN: [id number]  HPI [name] [name] is a 75 year old female with a past medical history of ischemic colitis, colon polyps, CAD on Plavix, hypertension, hyperlipidemia, diabetes and chronic kidney disease who is seen in follow-up to discuss surveillance colonoscopy. She is also been having issues with lower abdominal pain and loose stools/diarrhea. In April 2014 she had a colonoscopy which revealed left-sided colitis, mild diverticulosis and a 5 mm sessile tubular adenoma at the splenic flexure. Biopsies revealed changes consistent with ischemic colitis. After this she was seen in follow-up and was not having any colonic or abdominal symptoms to warrant treatment.  More recently she's been dealing with lower abdominal pain and cramping worse before bowel movement. This can become intense and lead to nausea and even vomiting. She is using Bentyl 10 mg 3 times a day which helps but not completely. [name] having 3-4 loose bowel movements per day and she reports that "once I am empty" her lower abdominal pain resolves. There is no blood in her stool but she seed some pink tinged fluid with wiping. [name] lost 7 pounds in the last couple of months despite reporting a good appetite. She continues on Plavix under the direction of Dr. Burt Knack. In kidneys on omeprazole which controls heartburn. She tried a period off of omeprazole and heartburn was significant.   Review of Systems As per HPI, otherwise negative  Current Medications, Allergies, Past Medical History, Past Surgical History, Family History and Social History were reviewed in [employer] record.     Objective:   Physical Exam BP 124/60 (BP Location: Left Arm, Patient Position: Sitting, Cuff Size: Normal)   Pulse 64   Ht 5\' 1"  (1.549 m)   Wt 119 lb (54 kg)   BMI 22.48 kg/m  Constitutional: Well-developed and well-nourished. No  distress. HEENT: Normocephalic and atraumatic. Oropharynx is clear and moist. No oropharyngeal exudate. Conjunctivae are normal.  No scleral icterus. Neck: Neck supple. Trachea midline. Cardiovascular: Normal rate, regular rhythm and intact distal pulses.  Pulmonary/chest: Effort normal and breath sounds normal. No wheezing, rales or rhonchi. Abdominal: Soft, Bilateral lower abdominal tenderness without rebound or guarding, nondistended. Bowel sounds active throughout. There are no masses palpable. No hepatosplenomegaly. Extremities: no clubbing, cyanosis, or edema Lymphadenopathy: No cervical adenopathy noted. Neurological: Alert and oriented to person place and time. Skin: Skin is warm and dry. No rashes noted. Psychiatric: Normal mood and affect. Behavior is normal.  CBC    Component Value Date/Time   WBC 7.6 10/20/2015 1147   RBC 4.35 10/20/2015 1147   HGB 13.6 10/20/2015 1147   HCT 40.6 10/20/2015 1147   PLT 297.0 10/20/2015 1147   MCV 93.3 10/20/2015 1147   MCH 32.5 04/02/2012 0948   MCHC 33.5 10/20/2015 1147   RDW 13.5 10/20/2015 1147   LYMPHSABS 3.0 10/20/2015 1147   MONOABS 0.7 10/20/2015 1147   EOSABS 0.2 10/20/2015 1147   BASOSABS 0.1 10/20/2015 1147   CMP     Component Value Date/Time   NA 139 04/18/2016 1125   K 3.9 04/18/2016 1125   CL 98 04/18/2016 1125   CO2 35 (H) 04/18/2016 1125   GLUCOSE 126 (H) 04/18/2016 1125   GLUCOSE 126 (H) 06/24/2006 0738   BUN 16 04/18/2016 1125   CREATININE 0.85 04/18/2016 1125   CALCIUM 9.3 04/18/2016 1125   PROT 7.2 03/10/2015 1530   ALBUMIN 4.0 03/10/2015 1530  AST 20 03/10/2015 1530   ALT 27 03/10/2015 1530   ALKPHOS 70 03/10/2015 1530   BILITOT 0.4 03/10/2015 1530   GFRNONAA 65 (L) 04/02/2012 0948   GFRAA 75 (L) 04/02/2012 0948       Assessment & Plan:  75 year old female with a past medical history of ischemic colitis, colon polyps, CAD on Plavix, hypertension, hyperlipidemia, diabetes and chronic kidney disease  who is seen in follow-up to discuss surveillance colonoscopy.  1. Lower abdominal pain with loose stools/history of ischemic colitis/hx of colon polyps -- concern for possibly an inflammatory colitis given her symptoms. Her pain is consistent with colonic spasm. I would like to check a fecal calprotectin, C. difficile, stool culture and fecal elastase. Increase Bentyl to 20 mg 3 times a day. I recommended repeating colonoscopy for surveillance and also to evaluate these symptoms. We discussed the risks, benefits and alternatives and she wishes to proceed. Hold Plavix 5 days before procedure - will instruct when and how to resume after procedure. Risks and benefits of procedure including bleeding, perforation, infection, missed lesions, medication reactions and possible hospitalization or surgery if complications occur explained. Additional rare but real risk of cardiovascular event such as heart attack or ischemia/infarct of other organs off Plavix explained and need to seek urgent help if this occurs. Will communicate by phone or EMR with patient's prescribing provider that to confirm holding Plavix is reasonable in this case.   2. GERD -- controlled without alarm symptoms. Continue omeprazole daily.  25 minutes spent with the patient today. Greater than 50% was spent in counseling and coordination of care with the patient

## 2016-05-09 ENCOUNTER — Other Ambulatory Visit: Payer: PPO

## 2016-05-09 ENCOUNTER — Other Ambulatory Visit: Payer: Self-pay | Admitting: Internal Medicine

## 2016-05-09 DIAGNOSIS — R1030 Lower abdominal pain, unspecified: Secondary | ICD-10-CM

## 2016-05-09 DIAGNOSIS — K559 Vascular disorder of intestine, unspecified: Secondary | ICD-10-CM

## 2016-05-09 DIAGNOSIS — R197 Diarrhea, unspecified: Secondary | ICD-10-CM | POA: Diagnosis not present

## 2016-05-10 LAB — CLOSTRIDIUM DIFFICILE BY PCR: CDIFFPCR: NOT DETECTED

## 2016-05-13 LAB — STOOL CULTURE

## 2016-05-16 LAB — PANCREATIC ELASTASE, FECAL: Pancreatic Elastase-1, Stool: 195 mcg/g — ABNORMAL LOW

## 2016-05-16 LAB — CALPROTECTIN: Calprotectin: 199.6 mcg/g — ABNORMAL HIGH (ref <=162.9)

## 2016-05-16 NOTE — Telephone Encounter (Signed)
Okay to hold Plavix 5 days prior to endoscopy. Please resume after the procedure at the discretion of the gastroenterologist.

## 2016-05-17 NOTE — Telephone Encounter (Signed)
I have spoken to patient to advise that per Dr Burt Knack, she may hold Plavix 5 days prior to her scheduled procedure and resume as per Dr Vena Rua instructions following procedure. She verbalizes understanding.

## 2016-05-24 ENCOUNTER — Ambulatory Visit (INDEPENDENT_AMBULATORY_CARE_PROVIDER_SITE_OTHER): Payer: PPO

## 2016-05-24 DIAGNOSIS — Z23 Encounter for immunization: Secondary | ICD-10-CM

## 2016-06-14 ENCOUNTER — Encounter: Payer: Self-pay | Admitting: Internal Medicine

## 2016-06-28 ENCOUNTER — Encounter: Payer: Self-pay | Admitting: Internal Medicine

## 2016-06-28 ENCOUNTER — Ambulatory Visit (AMBULATORY_SURGERY_CENTER): Payer: PPO | Admitting: Internal Medicine

## 2016-06-28 VITALS — BP 129/56 | HR 65 | Temp 97.8°F | Resp 9 | Ht 61.0 in | Wt 119.0 lb

## 2016-06-28 DIAGNOSIS — R197 Diarrhea, unspecified: Secondary | ICD-10-CM

## 2016-06-28 DIAGNOSIS — Z1211 Encounter for screening for malignant neoplasm of colon: Secondary | ICD-10-CM | POA: Diagnosis not present

## 2016-06-28 DIAGNOSIS — D124 Benign neoplasm of descending colon: Secondary | ICD-10-CM

## 2016-06-28 DIAGNOSIS — R1030 Lower abdominal pain, unspecified: Secondary | ICD-10-CM | POA: Diagnosis not present

## 2016-06-28 LAB — HM COLONOSCOPY

## 2016-06-28 LAB — GLUCOSE, CAPILLARY
Glucose-Capillary: 95 mg/dL (ref 65–99)
Glucose-Capillary: 95 mg/dL (ref 65–99)

## 2016-06-28 MED ORDER — SODIUM CHLORIDE 0.9 % IV SOLN: 500.0000 mL | INTRAVENOUS | Status: DC

## 2016-06-28 NOTE — Progress Notes (Signed)
A and O x3. Report to RN. Tolerated MAC anesthesia well. 

## 2016-06-28 NOTE — Progress Notes (Signed)
No egg or soy allergy known to patient  No issues with past sedation with any surgeries  or procedures, no intubation problems  No diet pills per patient No home 02 use per patient  Pt denies issues with constipation  No A fib or A flutter   Pt takes plavix- last dose was Friday  10-20 per pt.

## 2016-06-28 NOTE — Patient Instructions (Signed)
     RESUME PLAVIX TOMORROW    INFORMATION ON POLYPS,DIVERTICULOSIS ,AND HEMORRHOIDS GIVEN TO YOU TODAY    YOU HAD AN ENDOSCOPIC PROCEDURE TODAY AT [facility]:   Refer to the procedure report that was given to you for any specific questions about what was found during the examination.  If the procedure report does not answer your questions, please call your gastroenterologist to clarify.  If you requested that your care partner not be given the details of your procedure findings, then the procedure report has been included in a sealed envelope for you to review at your convenience later.  YOU SHOULD EXPECT: Some feelings of bloating in the abdomen. Passage of more gas than usual.  Walking can help get rid of the air that was put into your GI tract during the procedure and reduce the bloating. If you had a lower endoscopy (such as a colonoscopy or flexible sigmoidoscopy) you may notice spotting of blood in your stool or on the toilet paper. If you underwent a bowel prep for your procedure, you may not have a normal bowel movement for a few days.  Please Note:  You might notice some irritation and congestion in your nose or some drainage.  This is from the oxygen used during your procedure.  There is no need for concern and it should clear up in a day or so.  SYMPTOMS TO REPORT IMMEDIATELY:   Following lower endoscopy (colonoscopy or flexible sigmoidoscopy):  Excessive amounts of blood in the stool  Significant tenderness or worsening of abdominal pains  Swelling of the abdomen that is new, acute  Fever of 100F or higher    For urgent or emergent issues, a gastroenterologist can be reached at any hour by calling [phone].   DIET:  We do recommend a small meal at first, but then you may proceed to your regular diet.  Drink plenty of fluids but you should avoid alcoholic beverages for 24 hours.  ACTIVITY:  You should plan to take it easy for the rest of today and you  should NOT DRIVE or use heavy machinery until tomorrow (because of the sedation medicines used during the test).    FOLLOW UP: Our staff will call the number listed on your records the next business day following your procedure to check on you and address any questions or concerns that you may have regarding the information given to you following your procedure. If we do not reach you, we will leave a message.  However, if you are feeling well and you are not experiencing any problems, there is no need to return our call.  We will assume that you have returned to your regular daily activities without incident.  If any biopsies were taken you will be contacted by phone or by letter within the next 1-3 weeks.  Please call us at [phone] if you have not heard about the biopsies in 3 weeks.    SIGNATURES/CONFIDENTIALITY: You and/or your care partner have signed paperwork which will be entered into your electronic medical record.  These signatures attest to the fact that that the information above on your After Visit Summary has been reviewed and is understood.  Full responsibility of the confidentiality of this discharge information lies with you and/or your care-partner.

## 2016-06-28 NOTE — Op Note (Signed)
[facility] Patient Name: [name] [name] Procedure Date: 06/28/2016 1:31 PM MRN: [id number] Endoscopist: Jerene Bears , MD Age: 75 Referring MD:  Date of Birth: 1941/06/03 Gender: Female Account #: [id number] Procedure:                Colonoscopy Indications:              Lower abdominal pain, Clinically significant                            diarrhea of unexplained origin, history of                            adenomatous polyps in the colon Medicines:                Monitored Anesthesia Care Procedure:                Pre-Anesthesia Assessment:                           - Prior to the procedure, a History and Physical                            was performed, and patient medications and                            allergies were reviewed. The patient's tolerance of                            previous anesthesia was also reviewed. The risks                            and benefits of the procedure and the sedation                            options and risks were discussed with the patient.                            All questions were answered, and informed consent                            was obtained. Prior Anticoagulants: The patient has                            taken Plavix (clopidogrel), last dose was 5 days                            prior to procedure. ASA Grade Assessment: III - A                            patient with severe systemic disease. After                            reviewing the risks and benefits, the patient was  deemed in satisfactory condition to undergo the                            procedure.                           After obtaining informed consent, the colonoscope                            was passed under direct vision. Throughout the                            procedure, the patient's blood pressure, pulse, and                            oxygen saturations were monitored continuously. The     Model PCF-H190DL [id number]) scope was introduced                            through the anus and advanced to the the terminal                            ileum. The colonoscopy was performed without                            difficulty. The patient tolerated the procedure                            well. The quality of the bowel preparation was                            good. The terminal ileum, ileocecal valve,                            appendiceal orifice, and rectum were photographed. Scope In: 1:36:56 PM Scope Out: 1:51:28 PM Scope Withdrawal Time: 0 hours 10 minutes 32 seconds  Total Procedure Duration: 0 hours 14 minutes 32 seconds  Findings:                 The perianal exam findings include non-thrombosed                            internal hemorrhoids and internal hemorrhoids that                            prolapse with straining, but spontaneously regress                            to the resting position (Grade II).                           The terminal ileum appeared normal.                           A 3 mm polyp was found in the descending colon. The  polyp was sessile. The polyp was removed with a                            cold biopsy forceps. Resection and retrieval were                            complete.                           Multiple diverticula were found in the sigmoid                            colon and descending colon.                           Normal mucosa was found in the entire colon.                            Biopsies for histology were taken with a cold                            forceps from the right colon and left colon for                            evaluation of microscopic colitis.                           Internal hemorrhoids were found during                            retroflexion. The hemorrhoids were medium-sized. Complications:            No immediate complications. Estimated Blood Loss:     Estimated  blood loss was minimal. Impression:               - The examined portion of the ileum was normal.                           - One 3 mm polyp in the descending colon, removed                            with a cold biopsy forceps. Resected and retrieved.                           - Moderate diverticulosis in the sigmoid colon and                            in the descending colon.                           - Normal mucosa in the entire examined colon.                            Biopsied.                           -  Internal hemorrhoids. Recommendation:           - Patient has a contact number available for                            emergencies. The signs and symptoms of potential                            delayed complications were discussed with the                            patient. Return to normal activities tomorrow.                            Written discharge instructions were provided to the                            patient.                           - Resume previous diet.                           - Continue present medications.                           - Await pathology results.                           - Resume Plavix (clopidogrel) at prior dose                            tomorrow.                           - No recommendation at this time regarding repeat                            colonoscopy due to age. Jerene Bears, MD 06/28/2016 1:59:13 PM This report has been signed electronically.

## 2016-06-28 NOTE — Progress Notes (Signed)
Called to room to assist during endoscopic procedure.  Patient ID and intended procedure confirmed with present staff. Received instructions for my participation in the procedure from the performing physician.  

## 2016-06-29 ENCOUNTER — Telehealth: Payer: Self-pay

## 2016-06-29 NOTE — Telephone Encounter (Signed)
  Follow up Call-  Call back number 06/28/2016  Post procedure Call Back phone  # [phone] cell   Permission to leave phone message No  comments no VM   Some recent data might be hidden     Patient questions:  Do you have a fever, pain , or abdominal swelling? No. Pain Score  0 *  Have you tolerated food without any problems? Yes.    Have you been able to return to your normal activities? Yes.    Do you have any questions about your discharge instructions: Diet   No. Medications  No. Follow up visit  No.  Do you have questions or concerns about your Care? No.  Actions: * If pain score is 4 or above: No action needed, pain <4.

## 2016-07-05 ENCOUNTER — Other Ambulatory Visit: Payer: Self-pay

## 2016-07-05 MED ORDER — MESALAMINE 1.2 G PO TBEC: 2.4000 g | DELAYED_RELEASE_TABLET | Freq: Every day | ORAL | 3 refills | Status: DC

## 2016-07-30 ENCOUNTER — Other Ambulatory Visit: Payer: Self-pay | Admitting: Internal Medicine

## 2016-08-13 ENCOUNTER — Other Ambulatory Visit: Payer: Self-pay | Admitting: Internal Medicine

## 2016-09-21 ENCOUNTER — Other Ambulatory Visit: Payer: Self-pay | Admitting: Internal Medicine

## 2016-09-25 ENCOUNTER — Ambulatory Visit: Payer: PPO | Admitting: Internal Medicine

## 2016-10-01 ENCOUNTER — Encounter: Payer: Self-pay | Admitting: *Deleted

## 2016-10-26 ENCOUNTER — Ambulatory Visit: Payer: PPO | Admitting: Internal Medicine

## 2016-10-29 ENCOUNTER — Other Ambulatory Visit: Payer: Self-pay | Admitting: *Deleted

## 2016-10-29 DIAGNOSIS — E118 Type 2 diabetes mellitus with unspecified complications: Secondary | ICD-10-CM

## 2016-10-29 DIAGNOSIS — I10 Essential (primary) hypertension: Secondary | ICD-10-CM

## 2016-10-29 DIAGNOSIS — I2510 Atherosclerotic heart disease of native coronary artery without angina pectoris: Secondary | ICD-10-CM

## 2016-10-29 MED ORDER — LOSARTAN POTASSIUM 100 MG PO TABS: 100.0000 mg | ORAL_TABLET | Freq: Every day | ORAL | 1 refills | Status: DC

## 2016-10-29 NOTE — Telephone Encounter (Signed)
Left msg on triage stating mom need refill on her Losartan sent to [facility] [place].

## 2017-01-14 ENCOUNTER — Other Ambulatory Visit: Payer: Self-pay | Admitting: Cardiovascular Disease

## 2017-01-14 DIAGNOSIS — I10 Essential (primary) hypertension: Secondary | ICD-10-CM

## 2017-01-14 DIAGNOSIS — I25118 Atherosclerotic heart disease of native coronary artery with other forms of angina pectoris: Secondary | ICD-10-CM

## 2017-01-14 NOTE — Telephone Encounter (Signed)
Patients daughter, Jeannene Patella called and requested a refill on metoprolol for the patient. I made her aware that the patient needs an appointment. She stated that the patients husband just recently passed away and patient currently resides in [place]. She requested at least a one month supply to last until the patient sees her PCP and she will request further refills of this medication from them. She is aware that I will authorize one thirty day supply. Daughter very [employer] attorney.

## 2017-02-12 DIAGNOSIS — Z86010 Personal history of colonic polyps: Secondary | ICD-10-CM | POA: Diagnosis not present

## 2017-02-12 DIAGNOSIS — K219 Gastro-esophageal reflux disease without esophagitis: Secondary | ICD-10-CM | POA: Diagnosis not present

## 2017-02-12 DIAGNOSIS — Z955 Presence of coronary angioplasty implant and graft: Secondary | ICD-10-CM | POA: Diagnosis not present

## 2017-02-12 DIAGNOSIS — M5412 Radiculopathy, cervical region: Secondary | ICD-10-CM | POA: Diagnosis not present

## 2017-02-12 DIAGNOSIS — I10 Essential (primary) hypertension: Secondary | ICD-10-CM | POA: Diagnosis not present

## 2017-02-12 DIAGNOSIS — E1165 Type 2 diabetes mellitus with hyperglycemia: Secondary | ICD-10-CM | POA: Diagnosis not present

## 2017-02-12 DIAGNOSIS — I2510 Atherosclerotic heart disease of native coronary artery without angina pectoris: Secondary | ICD-10-CM | POA: Diagnosis not present

## 2017-02-12 DIAGNOSIS — E785 Hyperlipidemia, unspecified: Secondary | ICD-10-CM | POA: Diagnosis not present

## 2017-02-12 DIAGNOSIS — K589 Irritable bowel syndrome without diarrhea: Secondary | ICD-10-CM | POA: Diagnosis not present

## 2017-02-12 DIAGNOSIS — N951 Menopausal and female climacteric states: Secondary | ICD-10-CM | POA: Diagnosis not present

## 2017-03-12 DIAGNOSIS — E1165 Type 2 diabetes mellitus with hyperglycemia: Secondary | ICD-10-CM | POA: Diagnosis not present

## 2017-03-12 DIAGNOSIS — M5412 Radiculopathy, cervical region: Secondary | ICD-10-CM | POA: Diagnosis not present

## 2017-03-12 DIAGNOSIS — K589 Irritable bowel syndrome without diarrhea: Secondary | ICD-10-CM | POA: Diagnosis not present

## 2017-03-12 DIAGNOSIS — I10 Essential (primary) hypertension: Secondary | ICD-10-CM | POA: Diagnosis not present

## 2017-03-12 DIAGNOSIS — E785 Hyperlipidemia, unspecified: Secondary | ICD-10-CM | POA: Diagnosis not present

## 2017-03-12 DIAGNOSIS — I2510 Atherosclerotic heart disease of native coronary artery without angina pectoris: Secondary | ICD-10-CM | POA: Diagnosis not present

## 2017-03-12 DIAGNOSIS — N951 Menopausal and female climacteric states: Secondary | ICD-10-CM | POA: Diagnosis not present

## 2017-11-14 LAB — HM DIABETES EYE EXAM

## 2018-01-23 ENCOUNTER — Other Ambulatory Visit: Payer: Self-pay

## 2018-01-23 ENCOUNTER — Emergency Department (HOSPITAL_COMMUNITY): Payer: Medicare PPO

## 2018-01-23 ENCOUNTER — Encounter (HOSPITAL_COMMUNITY): Payer: Self-pay

## 2018-01-23 ENCOUNTER — Emergency Department (HOSPITAL_COMMUNITY)
Admission: EM | Admit: 2018-01-23 | Discharge: 2018-01-23 | Disposition: A | Discharge status: Home / Self Care | Payer: Medicare PPO | Attending: Emergency Medicine | Admitting: Emergency Medicine

## 2018-01-23 DIAGNOSIS — Y92009 Unspecified place in unspecified non-institutional (private) residence as the place of occurrence of the external cause: Secondary | ICD-10-CM | POA: Insufficient documentation

## 2018-01-23 DIAGNOSIS — Y999 Unspecified external cause status: Secondary | ICD-10-CM | POA: Diagnosis not present

## 2018-01-23 DIAGNOSIS — M79661 Pain in right lower leg: Secondary | ICD-10-CM | POA: Diagnosis not present

## 2018-01-23 DIAGNOSIS — I10 Essential (primary) hypertension: Secondary | ICD-10-CM | POA: Diagnosis not present

## 2018-01-23 DIAGNOSIS — I2510 Atherosclerotic heart disease of native coronary artery without angina pectoris: Secondary | ICD-10-CM | POA: Insufficient documentation

## 2018-01-23 DIAGNOSIS — M25552 Pain in left hip: Secondary | ICD-10-CM

## 2018-01-23 DIAGNOSIS — W109XXA Fall (on) (from) unspecified stairs and steps, initial encounter: Secondary | ICD-10-CM | POA: Insufficient documentation

## 2018-01-23 DIAGNOSIS — I252 Old myocardial infarction: Secondary | ICD-10-CM | POA: Diagnosis not present

## 2018-01-23 DIAGNOSIS — M79662 Pain in left lower leg: Secondary | ICD-10-CM | POA: Insufficient documentation

## 2018-01-23 DIAGNOSIS — W19XXXA Unspecified fall, initial encounter: Secondary | ICD-10-CM

## 2018-01-23 DIAGNOSIS — E119 Type 2 diabetes mellitus without complications: Secondary | ICD-10-CM | POA: Insufficient documentation

## 2018-01-23 DIAGNOSIS — T07XXXA Unspecified multiple injuries, initial encounter: Secondary | ICD-10-CM | POA: Diagnosis present

## 2018-01-23 DIAGNOSIS — M79604 Pain in right leg: Secondary | ICD-10-CM

## 2018-01-23 DIAGNOSIS — S0990XA Unspecified injury of head, initial encounter: Secondary | ICD-10-CM | POA: Diagnosis not present

## 2018-01-23 DIAGNOSIS — Z7901 Long term (current) use of anticoagulants: Secondary | ICD-10-CM | POA: Insufficient documentation

## 2018-01-23 DIAGNOSIS — Y939 Activity, unspecified: Secondary | ICD-10-CM | POA: Diagnosis not present

## 2018-01-23 DIAGNOSIS — S0083XA Contusion of other part of head, initial encounter: Secondary | ICD-10-CM

## 2018-01-23 LAB — BASIC METABOLIC PANEL
Anion gap: 9 (ref 5–15)
BUN: 15 mg/dL (ref 6–20)
CALCIUM: 8.6 mg/dL — AB (ref 8.9–10.3)
CO2: 27 mmol/L (ref 22–32)
CREATININE: 0.86 mg/dL (ref 0.44–1.00)
Chloride: 103 mmol/L (ref 101–111)
GFR calc Af Amer: >60 mL/min (ref >60)
GLUCOSE: 145 mg/dL — AB (ref 65–99)
Potassium: 4.3 mmol/L (ref 3.5–5.1)
SODIUM: 139 mmol/L (ref 135–145)

## 2018-01-23 LAB — CBC WITH DIFFERENTIAL/PLATELET
BASOS ABS: 0 10*3/uL (ref 0.0–0.1)
Basophils Relative: 0 %
EOS ABS: 0.1 10*3/uL (ref 0.0–0.7)
EOS PCT: 1 %
HCT: 35.1 % — ABNORMAL LOW (ref 36.0–46.0)
Hemoglobin: 11.3 g/dL — ABNORMAL LOW (ref 12.0–15.0)
LYMPHS PCT: 21 %
Lymphs Abs: 2.3 10*3/uL (ref 0.7–4.0)
MCH: 31.7 pg (ref 26.0–34.0)
MCHC: 32.2 g/dL (ref 30.0–36.0)
MCV: 98.6 fL (ref 78.0–100.0)
MONO ABS: 0.7 10*3/uL (ref 0.1–1.0)
Monocytes Relative: 6 %
Neutro Abs: 7.7 10*3/uL (ref 1.7–7.7)
Neutrophils Relative %: 72 %
PLATELETS: 271 10*3/uL (ref 150–400)
RBC: 3.56 MIL/uL — AB (ref 3.87–5.11)
RDW: 13.1 % (ref 11.5–15.5)
WBC: 10.8 10*3/uL — AB (ref 4.0–10.5)

## 2018-01-23 LAB — PROTIME-INR
INR: 1.09
Prothrombin Time: 14 seconds (ref 11.4–15.2)

## 2018-01-23 MED ORDER — TRAMADOL HCL 50 MG PO TABS: 50.0000 mg | ORAL_TABLET | Freq: Four times a day (QID) | ORAL | 0 refills | Status: DC | PRN

## 2018-01-23 MED ORDER — FENTANYL CITRATE (PF) 100 MCG/2ML IJ SOLN
50.0000 ug | INTRAMUSCULAR | Status: DC | PRN
  Administered 2018-01-23 (×2): 50 ug via INTRAVENOUS
  Filled 2018-01-23 (×2): qty 2

## 2018-01-23 NOTE — ED Notes (Signed)
Pt tolerated ambulation well.

## 2018-01-23 NOTE — ED Triage Notes (Signed)
Pt states she woke up this morning and went to let the dog out. Pt tripped down stairs. Pt states no LOC. Pain to right shin and left thigh. Pt unable to bear weight. Pt iced bilateral legs at home,  1000mg  tylenol, and "just a little piece" of a muscle relaxer, without relief. Pt also hit right side of face. Pt takes Plavix

## 2018-01-23 NOTE — ED Provider Notes (Signed)
Emergency Department Provider Note   I have reviewed the triage vital signs and the nursing notes.   HISTORY  Chief Complaint Leg Pain   HPI [name] [name] is a 77 y.o. female with PMH of CAD, CKD, HTN, DM, HLD, and DJD's to the emergency department for evaluation after a fall this morning.  The patient is visiting family from out of town and woke up to take her dog out to be.  She got to the top of the flight of approximately 16 stairs when she tripped and fell from the top of the stairs.  She did not recall any head injury.  She denies loss of consciousness.  Family at bedside did not witness the fall but thinks that she may have slid down the stairs on her backside.  She was able to limp over to the couch where ice was applied to the left and right legs.  Patient states she is primarily having pain in her left thigh and knee and cannot put any weight on this.  She is also having pain in the right lower leg.  She has noticed some bruising to the right [place].  Family went to run errands and then called back to check on the patient.  They noticed that she was in more pain and transported the patient to the emergency department.  She refused ambulance transport. Patient is on Plavix.    Past Medical History:  Diagnosis Date  . Allergy   . Anxiety   . CAD (coronary artery disease)    1.  inf MI 2007 - tx with DES to RCA;  2.  Echocardiogram 08/2007: EF 60%, mild MR, mild LAE.  3.  Last LHC 08/2009: dLM 20%, LAD beyond diagonal 60-70%, stable from prior catheterization in 2007, ostial circumflex 40-50%, mid RCA stent okay, EF 65%. ;  4.  myoview was recommended 08/2009: EF 76%, small inferoapical fixed defect, no ischemia  5.  Myoview 7/13: no scar or ischemia, EF 69%  . Cataract    removed both eyes  . Cerebrovascular disease, unspecified   . Chronic kidney disease    frequent Kidney Infections  . Chronic neck pain   . Chronic pain syndrome   . Diverticular disease   . Dizziness   .  DJD (degenerative joint disease)   . DM type 2 (diabetes mellitus, type 2) ([place])   . GERD (gastroesophageal reflux disease)   . H/O hiatal hernia   . HTN (hypertension)   . IBS (irritable bowel syndrome)   . 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 Internal hemorrhoids   . Ischemic colitis ([place])   . Mitral valve prolapse   . Mixed hyperlipidemia   . Myocardial infarction ([place]) 2007  . Neuromuscular disorder ([place])    hiatal hernia  . Nocturia   . Pancreatitis    1955 an once more  . PONV (postoperative nausea and vomiting)    Difficluty opening mouth wide and turning head. (Cervical Fusion)  . Premature ventricular contractions   . Tubular adenoma of colon   . Ulcer    [facility]  . Urinary incontinence     Patient Active Problem List   Diagnosis Date Noted  . Bilateral posterior neck pain 07/20/2015  . Episodic tension-type headache, not intractable 11/08/2014  . Other seasonal allergic rhinitis 07/08/2014  . Diabetic peripheral neuropathy ([place]) 06/29/2014  . Routine general medical examination at a health care facility 01/20/2014  . Other screening mammogram 01/20/2014  .  Essential hypertension 08/03/2009  . Coronary atherosclerosis 01/14/2008  . Controlled diabetes mellitus type 2 with complications ([place]) 16/03/3709  . Hyperlipidemia with target LDL less than 100 06/13/2007  . Depression with anxiety 06/13/2007  . GASTROESOPHAGEAL REFLUX DISEASE 06/13/2007  . DEGENERATIVE JOINT DISEASE 06/13/2007    Past Surgical History:  Procedure Laterality Date  . APPENDECTOMY  1955  . BLADDER REPAIR  2007  . CARDIAC CATHETERIZATION  2007   Stents  . CERVICAL SPINE SURGERY  07/2005   Dr Consuello Masse  . CHOLECYSTECTOMY  2007  . COLONOSCOPY    . DENTAL SURGERY  05/2013   replaced an inplant  . EYE SURGERY  2011   Bilteral  . I&D KNEE WITH POLY EXCHANGE  09/25/2011   Procedure: IRRIGATION AND DEBRIDEMENT KNEE WITH POLY EXCHANGE;  Surgeon: Johnny Bridge, MD;   Location: [facility];  Service: Orthopedics;  Laterality: Left;  . INCISION AND DRAINAGE ABSCESS / HEMATOMA OF BURSA / KNEE / THIGH  09/2011  . JOINT REPLACEMENT  2012   left  . left Total Knee Replacement  11/2010   Dr Percell Miller  . NECK SURGERY     has had 3 surgeries  . POLYPECTOMY    . POSTERIOR CERVICAL FUSION/FORAMINOTOMY  04/08/2012   Procedure: POSTERIOR CERVICAL FUSION/FORAMINOTOMY LEVEL 2;  Surgeon: Otilio Connors, MD;  Location: [facility];  Service: Neurosurgery;  Laterality: Left;  Left Cervical six-seven Foraminotomy, bilateral cervical seven-thoracic one foraminotomy, cervical six-seven, cervical seven-thoracic one fusion with posterior instrumentation  . TEMPOROMANDIBULAR JOINT SURGERY    . thumb surgery    . TONSILLECTOMY    . TONSILLECTOMY  1950  . TOTAL ABDOMINAL HYSTERECTOMY      Current Outpatient Rx  . Order #: [id number] Class: Normal  . Order #: [id number] Class: Historical Med  . Order #: [id number] Class: Historical Med  . Order #: [id number] Class: Historical Med  . Order #: [id number] Class: Historical Med  . Order #: [id number] Class: Historical Med  . Order #: [id number] Class: Historical Med  . Order #: [id number] Class: Normal  . Order #: [id number] Class: Normal  . Order #: [id number] Class: Normal  . Order #: [id number] Class: Historical Med  . Order #: [id number] Class: Historical Med  . Order #: [id number] Class: Historical Med  . Order #: [id number] Class: Normal  . Order #: [id number] Class: Historical Med  . Order #: [id number] Class: Historical Med  . Order #: [id number] Class: Historical Med  . Order #: [id number] Class: Historical Med  . Order #: [id number] Class: Normal  . Order #: [id number] Class: Normal  . Order #: [id number] Class: Normal  . Order #: [id number] Class: Normal  . Order #: [id number] Class: Normal  . Order #: [id number] Class: Normal  . Order #: [id number] Class: Normal  . Order #: [id number] Class: Normal  . Order #: [id number] Class: Print    Allergies Niacin and related;  Codeine; Prednisone; Sulfonamide derivatives; and Tetracycline  Family History  Problem Relation Age of Onset  . Heart disease Mother   . Pancreatic cancer Mother   . Cancer - Other Brother        vocal cord cancer  . Colon polyps Sister   . Coronary artery disease Other        sibling  . Coronary artery disease Other        sibling  . Colon cancer Neg Hx   . Esophageal cancer Neg Hx   . Rectal cancer Neg Hx   . Stomach cancer Neg Hx     Social History Social History   Tobacco Use  . Smoking status: Never Smoker  .  Smokeless tobacco: Never Used  Substance Use Topics  . Alcohol use: No  . Drug use: No    Review of Systems  Constitutional: No fever/chills Eyes: No visual changes. ENT: No sore throat. Cardiovascular: Denies chest pain. Respiratory: Denies shortness of breath. Gastrointestinal: No abdominal pain.  No nausea, no vomiting.  No diarrhea.  No constipation. Genitourinary: Negative for dysuria. Musculoskeletal: Negative for back pain. Positive left thigh/knee pain. Positive right lower leg pain/bruising. Positive right jaw bruising.  Skin: Negative for rash. Neurological: Negative for headaches, focal weakness or numbness.  10-point ROS otherwise negative.  ____________________________________________   PHYSICAL EXAM:  VITAL SIGNS: ED Triage Vitals [01/23/18 1054]  Enc Vitals Group     BP (!) 125/54     Pulse Rate 66     Resp 14     Temp 98.5 F (36.9 C)     Temp Source Oral     SpO2 97 %     Weight 120 lb (54.4 kg)     Height 5' (1.524 m)     Pain Score 8   Constitutional: Alert and oriented. Well appearing and in no acute distress. Eyes: Conjunctivae are normal. PERRL.  Head: Atraumatic. Nose: No congestion/rhinnorhea. Mouth/Throat: Mucous membranes are moist. Bruising and mild pain to palpation over the right lateral jaw.  Neck: No stridor. No cervical spine tenderness to palpation. Cardiovascular: Normal rate, regular rhythm. Good  peripheral circulation. Grossly normal heart sounds.   Respiratory: Normal respiratory effort.  No retractions. Lungs CTAB. Gastrointestinal: Soft and nontender. No distention.  Musculoskeletal: Diffuse left thigh pain and swelling. Positive pain to palpation over the left hip. Mild left knee tenderness to palpation. Positive bruising over the right tib/fib anteriorly. No open fractures. Normal pulses and sensation in the feet bilaterally.  Neurologic:  Normal speech and language. No gross focal neurologic deficits are appreciated.  Skin:  Skin is warm, dry and intact. No rash noted. Bruising as noted above.   ____________________________________________   LABS (all labs ordered are listed, but only abnormal results are displayed)  Labs Reviewed  BASIC METABOLIC PANEL - Abnormal; Notable for the following components:      Result Value   Glucose, Bld 145 (*)    Calcium 8.6 (*)    All other components within normal limits  CBC WITH DIFFERENTIAL/PLATELET - Abnormal; Notable for the following components:   WBC 10.8 (*)    RBC 3.56 (*)    Hemoglobin 11.3 (*)    HCT 35.1 (*)    All other components within normal limits  PROTIME-INR   ____________________________________________  EKG   EKG Interpretation  Date/Time:  Thursday Jan 23 2018 11:24:50 EDT Ventricular Rate:  65 PR Interval:    QRS Duration: 88 QT Interval:  402 QTC Calculation: 418 R Axis:   -4 Text Interpretation:  Sinus rhythm No STEMI.  Confirmed by Nanda Quinton [phone]) on 01/23/2018 11:45:59 AM       ____________________________________________  RADIOLOGY  Dg Knee 2 Views Left  Result Date: 01/23/2018 CLINICAL DATA:  Tripped and fell today with pain in the left leg EXAM: LEFT KNEE - 1-2 VIEW COMPARISON:  Left knee films of 11/15/2010 FINDINGS: The femoral and tibial components of the left total knee replacement remain in good position with no fracture. On the lateral view, there is a linear lucency just above  the femoral prosthetic component posteriorly and prosthetic loosening cannot be excluded. The tibial component is unchanged in position. No joint effusion is seen IMPRESSION: 1. No  acute fracture. Left total knee knee replaced components remain. 2. Lucency along the superior aspect of the femoral component posteriorly could indicate prosthetic loosening. Correlate clinically. Electronically Signed   By: Ivar Drape M.D.   On: 01/23/2018 12:50   Dg Tibia/fibula Right  Result Date: 01/23/2018 CLINICAL DATA:  [place] today with pain in the right lower leg EXAM: RIGHT TIBIA AND FIBULA - 2 VIEW COMPARISON:  Right knee films of 04/23/2014 FINDINGS: The right tibia and fibula are intact and normally aligned. There is mild degenerative change of the right knee joint. No soft tissue abnormality is seen. IMPRESSION: No acute fracture.  Mild degenerative change of the right knee. Electronically Signed   By: Ivar Drape M.D.   On: 01/23/2018 12:51   Ct Head Wo Contrast  Result Date: 01/23/2018 CLINICAL DATA:  Facial trauma. Status post fall. No loss of consciousness. EXAM: CT HEAD WITHOUT CONTRAST CT MAXILLOFACIAL WITHOUT CONTRAST CT CERVICAL SPINE WITHOUT CONTRAST TECHNIQUE: Multidetector CT imaging of the head, cervical spine, and maxillofacial structures were performed using the standard protocol without intravenous contrast. Multiplanar CT image reconstructions of the cervical spine and maxillofacial structures were also generated. COMPARISON:  None. FINDINGS: CT HEAD FINDINGS Brain: No evidence of acute infarction, hemorrhage, extra-axial collection, ventriculomegaly, or mass effect. Generalized cerebral atrophy. Periventricular white matter low attenuation likely secondary to microangiopathy. Vascular: Cerebrovascular atherosclerotic calcifications are noted. Skull: Negative for fracture or focal lesion. Sinuses/Orbits: Visualized portions of the orbits are unremarkable. Visualized portions of the paranasal sinuses  and mastoid air cells are unremarkable. Other: None. CT MAXILLOFACIAL FINDINGS Osseous: No fracture or mandibular dislocation. No destructive process. Osteoarthritis of bilateral temporomandibular joints with multiple loose bodies in the left temporomandibular joint. Orbits: Negative. No traumatic or inflammatory finding. Sinuses: Clear. Soft tissues: Negative. CT CERVICAL SPINE FINDINGS Alignment: Normal. Skull base and vertebrae: No acute fracture. No primary bone lesion or focal pathologic process. Soft tissues and spinal canal: No prevertebral fluid or swelling. No visible canal hematoma. Disc levels: Anterior cervical fusion from C4 through T1 with orthopedic hardware at C6-T1. Solid osseous bridging across the C4-5 and C5-6 disc spaces. Osseous bridging across the C6-7 and C7-T1 disc space. Posterior cervical fusion at C2-4 and C6-T1. Severe degenerative disc disease with disc height loss at T2-3. Upper chest: Lung apices are clear. Other: No fluid collection or hematoma. IMPRESSION: 1. No acute intracranial pathology. 2.  No acute osseous injury of the maxillofacial bones. 3.  No acute osseous injury of the cervical spine. Electronically Signed   By: Kathreen Devoid   On: 01/23/2018 12:16   Ct Cervical Spine Wo Contrast  Result Date: 01/23/2018 CLINICAL DATA:  Facial trauma. Status post fall. No loss of consciousness. EXAM: CT HEAD WITHOUT CONTRAST CT MAXILLOFACIAL WITHOUT CONTRAST CT CERVICAL SPINE WITHOUT CONTRAST TECHNIQUE: Multidetector CT imaging of the head, cervical spine, and maxillofacial structures were performed using the standard protocol without intravenous contrast. Multiplanar CT image reconstructions of the cervical spine and maxillofacial structures were also generated. COMPARISON:  None. FINDINGS: CT HEAD FINDINGS Brain: No evidence of acute infarction, hemorrhage, extra-axial collection, ventriculomegaly, or mass effect. Generalized cerebral atrophy. Periventricular white matter low  attenuation likely secondary to microangiopathy. Vascular: Cerebrovascular atherosclerotic calcifications are noted. Skull: Negative for fracture or focal lesion. Sinuses/Orbits: Visualized portions of the orbits are unremarkable. Visualized portions of the paranasal sinuses and mastoid air cells are unremarkable. Other: None. CT MAXILLOFACIAL FINDINGS Osseous: No fracture or mandibular dislocation. No destructive process. Osteoarthritis of bilateral temporomandibular joints with  multiple loose bodies in the left temporomandibular joint. Orbits: Negative. No traumatic or inflammatory finding. Sinuses: Clear. Soft tissues: Negative. CT CERVICAL SPINE FINDINGS Alignment: Normal. Skull base and vertebrae: No acute fracture. No primary bone lesion or focal pathologic process. Soft tissues and spinal canal: No prevertebral fluid or swelling. No visible canal hematoma. Disc levels: Anterior cervical fusion from C4 through T1 with orthopedic hardware at C6-T1. Solid osseous bridging across the C4-5 and C5-6 disc spaces. Osseous bridging across the C6-7 and C7-T1 disc space. Posterior cervical fusion at C2-4 and C6-T1. Severe degenerative disc disease with disc height loss at T2-3. Upper chest: Lung apices are clear. Other: No fluid collection or hematoma. IMPRESSION: 1. No acute intracranial pathology. 2.  No acute osseous injury of the maxillofacial bones. 3.  No acute osseous injury of the cervical spine. Electronically Signed   By: Kathreen Devoid   On: 01/23/2018 12:16   Ct Femur Left Wo Contrast  Result Date: 01/23/2018 CLINICAL DATA:  Left upper leg pain since the patient tripped going down stairs this morning. Initial encounter. EXAM: CT OF THE LOWER LEFT EXTREMITY WITHOUT CONTRAST TECHNIQUE: Multidetector CT imaging of the lower left extremity was performed according to the standard protocol. COMPARISON:  Plain films left femur this same day. FINDINGS: Bones/Joint/Cartilage There is no acute bony or joint  abnormality. Moderate left hip osteoarthritis is seen with chondrocalcinosis identified about the joint. The patient has a left total knee arthroplasty. Ligaments Suboptimally assessed by CT. Muscles and Tendons Intact. Soft tissues There is some infiltration of subcutaneous fat about the superior aspect of the knee which may be postoperative. Imaged intrapelvic contents are unremarkable. IMPRESSION: Negative for fracture or other acute bony abnormality. Moderate left hip osteoarthritis. Status post left knee replacement. Infiltration of subcutaneous fat about the superior aspect of the knee may be postoperative. Electronically Signed   By: Inge Rise M.D.   On: 01/23/2018 14:22   Dg Hip Unilat W Or Wo Pelvis 2-3 Views Left  Result Date: 01/23/2018 CLINICAL DATA:  Tripped, now with pain and swelling of left thigh and able to bear weight EXAM: DG HIP (WITH OR WITHOUT PELVIS) 2-3V LEFT COMPARISON:  None. FINDINGS: Views of both hips show only mild degenerative joint disease of the hips for age left slightly greater than right with subchondral cyst formation noted. No acute fracture is seen. The pelvic rami are intact. The SI joints appear corticated. The sacral foramina unremarkable. IMPRESSION: Mild degenerative joint disease of the hips for age. No acute abnormality. Electronically Signed   By: Ivar Drape M.D.   On: 01/23/2018 12:46   Dg Femur 1 View Left  Result Date: 01/23/2018 CLINICAL DATA:  Tripped and fell today with pain in the left leg EXAM: LEFT FEMUR 1 VIEW COMPARISON:  None. FINDINGS: The left femur is intact and normally aligned. Left total knee replacement components are present. No abnormality of the soft tissues is seen. IMPRESSION: Negative. Electronically Signed   By: Ivar Drape M.D.   On: 01/23/2018 12:47   Ct Maxillofacial Wo Contrast  Result Date: 01/23/2018 CLINICAL DATA:  Facial trauma. Status post fall. No loss of consciousness. EXAM: CT HEAD WITHOUT CONTRAST CT  MAXILLOFACIAL WITHOUT CONTRAST CT CERVICAL SPINE WITHOUT CONTRAST TECHNIQUE: Multidetector CT imaging of the head, cervical spine, and maxillofacial structures were performed using the standard protocol without intravenous contrast. Multiplanar CT image reconstructions of the cervical spine and maxillofacial structures were also generated. COMPARISON:  None. FINDINGS: CT HEAD FINDINGS Brain: No  evidence of acute infarction, hemorrhage, extra-axial collection, ventriculomegaly, or mass effect. Generalized cerebral atrophy. Periventricular white matter low attenuation likely secondary to microangiopathy. Vascular: Cerebrovascular atherosclerotic calcifications are noted. Skull: Negative for fracture or focal lesion. Sinuses/Orbits: Visualized portions of the orbits are unremarkable. Visualized portions of the paranasal sinuses and mastoid air cells are unremarkable. Other: None. CT MAXILLOFACIAL FINDINGS Osseous: No fracture or mandibular dislocation. No destructive process. Osteoarthritis of bilateral temporomandibular joints with multiple loose bodies in the left temporomandibular joint. Orbits: Negative. No traumatic or inflammatory finding. Sinuses: Clear. Soft tissues: Negative. CT CERVICAL SPINE FINDINGS Alignment: Normal. Skull base and vertebrae: No acute fracture. No primary bone lesion or focal pathologic process. Soft tissues and spinal canal: No prevertebral fluid or swelling. No visible canal hematoma. Disc levels: Anterior cervical fusion from C4 through T1 with orthopedic hardware at C6-T1. Solid osseous bridging across the C4-5 and C5-6 disc spaces. Osseous bridging across the C6-7 and C7-T1 disc space. Posterior cervical fusion at C2-4 and C6-T1. Severe degenerative disc disease with disc height loss at T2-3. Upper chest: Lung apices are clear. Other: No fluid collection or hematoma. IMPRESSION: 1. No acute intracranial pathology. 2.  No acute osseous injury of the maxillofacial bones. 3.  No acute  osseous injury of the cervical spine. Electronically Signed   By: Kathreen Devoid   On: 01/23/2018 12:16    ____________________________________________   PROCEDURES  Procedure(s) performed:   Procedures  None ____________________________________________   INITIAL IMPRESSION / ASSESSMENT AND PLAN / ED COURSE  Pertinent labs & imaging results that were available during my care of the patient were reviewed by me and considered in my medical decision making (see chart for details).  Patient presents to the emergency department for evaluation after fall.  Patient has swelling over the left thigh and had some concern for either hip/femur fracture versus hematoma.  Patient is on Plavix so added labs and will place IV.  Patient's vital signs are normal.  Patient also with some bruising over the right tib/fib.   Plain films reviewed. No pain in area of lucency over the distal femur. CT obtained with continued severe pain. CT imaging with no evidence of fracture or hematoma. Patient ultimately ambulated in the ED with minimal assistance. Will discharge home with plan for Tylenol and heat application at home. Tramadol provided for breakthrough pain.   At this time, I do not feel there is any life-threatening condition present. I have reviewed and discussed all results (EKG, imaging, lab, urine as appropriate), exam findings with patient. I have reviewed nursing notes and appropriate previous records.  I feel the patient is safe to be discharged home without further emergent workup. Discussed usual and customary return precautions. Patient and family (if present) verbalize understanding and are comfortable with this plan.  Patient will follow-up with their primary care provider. If they do not have a primary care provider, information for follow-up has been provided to them. All questions have been answered.  ____________________________________________  FINAL CLINICAL IMPRESSION(S) / ED  DIAGNOSES  Final diagnoses:  Left hip pain  Right leg pain  Fall, initial encounter  Contusion of face, initial encounter  Injury of head, initial encounter    NEW OUTPATIENT MEDICATIONS STARTED DURING THIS VISIT:  Discharge Medication List as of 01/23/2018  2:56 PM    START taking these medications   Details  traMADol (ULTRAM) 50 MG tablet Take 1 tablet (50 mg total) by mouth every 6 (six) hours as needed for severe pain., Starting  Thu 01/23/2018, Print        Note:  This document was prepared using Dragon voice recognition software and may include unintentional dictation errors.  Nanda Quinton, MD Emergency Medicine    Stanley Helmuth, [facility], MD 01/24/18 0930

## 2018-01-23 NOTE — Discharge Instructions (Signed)
You were seen in the ED today after a fall. Your x-ray and CT scans were normal. You likely have bruising and muscle strain after your fall which is causing your pain. You may experience some additional muscle tightness. Take Tylenol and use heat compress for pain. Try to keep walking and moving at home to prevent stiffness. Use the Tramadol as needed for severe pain. Return to the ED with any new or sudden worsening pain.

## 2018-01-29 ENCOUNTER — Emergency Department (HOSPITAL_COMMUNITY): Payer: Medicare PPO

## 2018-01-29 ENCOUNTER — Encounter (HOSPITAL_COMMUNITY): Payer: Self-pay

## 2018-01-29 ENCOUNTER — Emergency Department (HOSPITAL_COMMUNITY)
Admission: EM | Admit: 2018-01-29 | Discharge: 2018-01-30 | Disposition: A | Discharge status: Home / Self Care | Payer: Medicare PPO | Attending: Emergency Medicine | Admitting: Emergency Medicine

## 2018-01-29 ENCOUNTER — Other Ambulatory Visit: Payer: Self-pay

## 2018-01-29 DIAGNOSIS — Y999 Unspecified external cause status: Secondary | ICD-10-CM | POA: Diagnosis not present

## 2018-01-29 DIAGNOSIS — I129 Hypertensive chronic kidney disease with stage 1 through stage 4 chronic kidney disease, or unspecified chronic kidney disease: Secondary | ICD-10-CM | POA: Insufficient documentation

## 2018-01-29 DIAGNOSIS — Z7982 Long term (current) use of aspirin: Secondary | ICD-10-CM | POA: Insufficient documentation

## 2018-01-29 DIAGNOSIS — Z7902 Long term (current) use of antithrombotics/antiplatelets: Secondary | ICD-10-CM | POA: Insufficient documentation

## 2018-01-29 DIAGNOSIS — E1122 Type 2 diabetes mellitus with diabetic chronic kidney disease: Secondary | ICD-10-CM | POA: Insufficient documentation

## 2018-01-29 DIAGNOSIS — N3000 Acute cystitis without hematuria: Secondary | ICD-10-CM | POA: Diagnosis not present

## 2018-01-29 DIAGNOSIS — N189 Chronic kidney disease, unspecified: Secondary | ICD-10-CM | POA: Diagnosis not present

## 2018-01-29 DIAGNOSIS — W0110XA Fall on same level from slipping, tripping and stumbling with subsequent striking against unspecified object, initial encounter: Secondary | ICD-10-CM | POA: Insufficient documentation

## 2018-01-29 DIAGNOSIS — Y929 Unspecified place or not applicable: Secondary | ICD-10-CM | POA: Insufficient documentation

## 2018-01-29 DIAGNOSIS — S79922A Unspecified injury of left thigh, initial encounter: Secondary | ICD-10-CM | POA: Diagnosis present

## 2018-01-29 DIAGNOSIS — Z7984 Long term (current) use of oral hypoglycemic drugs: Secondary | ICD-10-CM | POA: Diagnosis not present

## 2018-01-29 DIAGNOSIS — Y9301 Activity, walking, marching and hiking: Secondary | ICD-10-CM | POA: Insufficient documentation

## 2018-01-29 DIAGNOSIS — I252 Old myocardial infarction: Secondary | ICD-10-CM | POA: Insufficient documentation

## 2018-01-29 DIAGNOSIS — Z96652 Presence of left artificial knee joint: Secondary | ICD-10-CM | POA: Diagnosis not present

## 2018-01-29 DIAGNOSIS — I2510 Atherosclerotic heart disease of native coronary artery without angina pectoris: Secondary | ICD-10-CM | POA: Insufficient documentation

## 2018-01-29 DIAGNOSIS — S7012XA Contusion of left thigh, initial encounter: Secondary | ICD-10-CM | POA: Diagnosis not present

## 2018-01-29 LAB — BASIC METABOLIC PANEL
Anion gap: 10 (ref 5–15)
BUN: 15 mg/dL (ref 6–20)
CALCIUM: 9 mg/dL (ref 8.9–10.3)
CO2: 28 mmol/L (ref 22–32)
Chloride: 99 mmol/L — ABNORMAL LOW (ref 101–111)
Creatinine, Ser: 0.77 mg/dL (ref 0.44–1.00)
GFR calc Af Amer: >60 mL/min (ref >60)
Glucose, Bld: 117 mg/dL — ABNORMAL HIGH (ref 65–99)
Potassium: 3.8 mmol/L (ref 3.5–5.1)
Sodium: 137 mmol/L (ref 135–145)

## 2018-01-29 LAB — URINALYSIS, ROUTINE W REFLEX MICROSCOPIC
Bilirubin Urine: NEGATIVE
GLUCOSE, UA: NEGATIVE mg/dL
Hgb urine dipstick: NEGATIVE
KETONES UR: NEGATIVE mg/dL
NITRITE: NEGATIVE
PH: 5 (ref 5.0–8.0)
Protein, ur: NEGATIVE mg/dL
SPECIFIC GRAVITY, URINE: 1.009 (ref 1.005–1.030)

## 2018-01-29 LAB — CBC WITH DIFFERENTIAL/PLATELET
Basophils Absolute: 0.1 10*3/uL (ref 0.0–0.1)
Basophils Relative: 1 %
EOS PCT: 3 %
Eosinophils Absolute: 0.2 10*3/uL (ref 0.0–0.7)
HEMATOCRIT: 32.5 % — AB (ref 36.0–46.0)
Hemoglobin: 10.6 g/dL — ABNORMAL LOW (ref 12.0–15.0)
LYMPHS PCT: 37 %
Lymphs Abs: 3.4 10*3/uL (ref 0.7–4.0)
MCH: 31.7 pg (ref 26.0–34.0)
MCHC: 32.6 g/dL (ref 30.0–36.0)
MCV: 97.3 fL (ref 78.0–100.0)
MONO ABS: 0.9 10*3/uL (ref 0.1–1.0)
MONOS PCT: 10 %
NEUTROS ABS: 4.6 10*3/uL (ref 1.7–7.7)
Neutrophils Relative %: 49 %
PLATELETS: 347 10*3/uL (ref 150–400)
RBC: 3.34 MIL/uL — ABNORMAL LOW (ref 3.87–5.11)
RDW: 13.7 % (ref 11.5–15.5)
WBC: 9.1 10*3/uL (ref 4.0–10.5)

## 2018-01-29 LAB — CK: Total CK: 78 U/L (ref 38–234)

## 2018-01-29 MED ORDER — CEPHALEXIN 500 MG PO CAPS: 500.0000 mg | ORAL_CAPSULE | Freq: Three times a day (TID) | ORAL | 0 refills | Status: DC

## 2018-01-29 MED ORDER — MORPHINE SULFATE (PF) 4 MG/ML IV SOLN
4.0000 mg | Freq: Once | INTRAVENOUS | Status: AC
  Administered 2018-01-29: 4 mg via INTRAVENOUS
  Filled 2018-01-29: qty 1

## 2018-01-29 MED ORDER — ONDANSETRON 4 MG PO TBDP: ORAL_TABLET | ORAL | 0 refills | Status: DC

## 2018-01-29 MED ORDER — CYCLOBENZAPRINE HCL 5 MG PO TABS: 5.0000 mg | ORAL_TABLET | Freq: Three times a day (TID) | ORAL | 0 refills | Status: DC | PRN

## 2018-01-29 MED ORDER — HYDROCODONE-ACETAMINOPHEN 5-325 MG PO TABS
1.0000 | ORAL_TABLET | Freq: Once | ORAL | Status: AC
  Administered 2018-01-29: 1 via ORAL
  Filled 2018-01-29: qty 1

## 2018-01-29 MED ORDER — SODIUM CHLORIDE 0.9 % IV SOLN
1.0000 g | Freq: Once | INTRAVENOUS | Status: AC
  Administered 2018-01-29: 1 g via INTRAVENOUS
  Filled 2018-01-29: qty 10

## 2018-01-29 MED ORDER — HYDROCODONE-ACETAMINOPHEN 5-325 MG PO TABS: 1.0000 | ORAL_TABLET | Freq: Four times a day (QID) | ORAL | 0 refills | Status: DC | PRN

## 2018-01-29 NOTE — ED Provider Notes (Signed)
[facility] DEPT Provider Note   CSN: [id number] Arrival date & time: 01/29/18  1628     History   Chief Complaint Chief Complaint  Patient presents with  . Leg Pain    HPI [name] [name] is a 77 y.o. female history of CAD status post stents on Plavix, CKD, here presenting with dysuria, low-grade temperature, left hip pain.  Patient states that she fell about a week ago onto her abdomen and her leg.  She came to the ED and had x-rays as well as CT of the left femur that did not show any occult fracture.  Patient states that the pain was improved initially but then got worse.  She states that she has more bruising on the left inner thigh and she has pain when she [name] weight on it. She started running low grade temp this morning and has mild dysuria. Denies actual fevers or cough or abdominal pain or vomiting.   The history is provided by the patient.    Past Medical History:  Diagnosis Date  . Allergy   . Anxiety   . CAD (coronary artery disease)    1.  inf MI 2007 - tx with DES to RCA;  2.  Echocardiogram 08/2007: EF 60%, mild MR, mild LAE.  3.  Last LHC 08/2009: dLM 20%, LAD beyond diagonal 60-70%, stable from prior catheterization in 2007, ostial circumflex 40-50%, mid RCA stent okay, EF 65%. ;  4.  myoview was recommended 08/2009: EF 76%, small inferoapical fixed defect, no ischemia  5.  Myoview 7/13: no scar or ischemia, EF 69%  . Cataract    removed both eyes  . Cerebrovascular disease, unspecified   . Chronic kidney disease    frequent Kidney Infections  . Chronic neck pain   . Chronic pain syndrome   . Diverticular disease   . Dizziness   . DJD (degenerative joint disease)   . DM type 2 (diabetes mellitus, type 2) ([place])   . GERD (gastroesophageal reflux disease)   . H/O hiatal hernia   . HTN (hypertension)   . IBS (irritable bowel syndrome)   . 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 Internal hemorrhoids   . 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 Mitral valve prolapse   . Mixed hyperlipidemia   . Myocardial infarction ([place]) 2007  . Neuromuscular disorder ([place])    hiatal hernia  . Nocturia   . Pancreatitis    1955 an once more  . PONV (postoperative nausea and vomiting)    Difficluty opening mouth wide and turning head. (Cervical Fusion)  . Premature ventricular contractions   . Tubular adenoma of colon   . Ulcer    [facility]  . Urinary incontinence     Patient Active Problem List   Diagnosis Date Noted  . Bilateral posterior neck pain 07/20/2015  . Episodic tension-type headache, not intractable 11/08/2014  . Other seasonal allergic rhinitis 07/08/2014  . Diabetic peripheral neuropathy ([place]) 06/29/2014  . Routine general medical examination at a health care facility 01/20/2014  . Other screening mammogram 01/20/2014  . Essential hypertension 08/03/2009  . Coronary atherosclerosis 01/14/2008  . Controlled diabetes mellitus type 2 with complications ([place]) 75/06/2584  . Hyperlipidemia with target LDL less than 100 06/13/2007  . Depression with anxiety 06/13/2007  . GASTROESOPHAGEAL REFLUX DISEASE 06/13/2007  . DEGENERATIVE JOINT DISEASE 06/13/2007    Past Surgical History:  Procedure Laterality Date  . APPENDECTOMY  1955  . BLADDER  REPAIR  2007  . CARDIAC CATHETERIZATION  2007   Stents  . CERVICAL SPINE SURGERY  07/2005   Dr Consuello Masse  . CHOLECYSTECTOMY  2007  . COLONOSCOPY    . DENTAL SURGERY  05/2013   replaced an inplant  . EYE SURGERY  2011   Bilteral  . I&D KNEE WITH POLY EXCHANGE  09/25/2011   Procedure: IRRIGATION AND DEBRIDEMENT KNEE WITH POLY EXCHANGE;  Surgeon: [name] Bridge, MD;  Location: [facility];  Service: Orthopedics;  Laterality: Left;  . INCISION AND DRAINAGE ABSCESS / HEMATOMA OF BURSA / KNEE / THIGH  09/2011  . JOINT REPLACEMENT  2012   left  . left Total Knee Replacement  11/2010   Dr [name] [name]  . NECK SURGERY     has had 3 surgeries  . POLYPECTOMY    . POSTERIOR CERVICAL  FUSION/FORAMINOTOMY  04/08/2012   Procedure: POSTERIOR CERVICAL FUSION/FORAMINOTOMY LEVEL 2;  Surgeon: [name] Connors, MD;  Location: [facility];  Service: Neurosurgery;  Laterality: Left;  Left Cervical six-seven Foraminotomy, bilateral cervical seven-thoracic one foraminotomy, cervical six-seven, cervical seven-thoracic one fusion with posterior instrumentation  . TEMPOROMANDIBULAR JOINT SURGERY    . thumb surgery    . TONSILLECTOMY    . TONSILLECTOMY  1950  . TOTAL ABDOMINAL HYSTERECTOMY       OB History   None      Home Medications    Prior to Admission medications   Medication Sig Start Date End Date Taking? Authorizing Provider  amLODipine (NORVASC) 5 MG tablet TAKE ONE TABLET BY MOUTH ONCE DAILY 01/23/16   [name] Mocha, MD  Ascorbic Acid (VITAMIN C) 1000 MG tablet Take 1,000 mg by mouth daily.      [provider]  aspirin EC 81 MG tablet Take 81 mg by mouth every morning.     [provider]  atorvastatin (LIPITOR) 80 MG tablet Take 80 mg by mouth daily.    [provider]  calcium carbonate (OS-CAL) 600 MG TABS Take 600 mg by mouth 2 (two) times daily with a meal.      [provider]  Cholecalciferol (VITAMIN D3) 1000 UNITS CAPS Take 1 capsule by mouth daily.      [provider]  Cinnamon 500 MG capsule Take 500 mg by mouth 2 (two) times daily.     [provider]  clopidogrel (PLAVIX) 75 MG tablet TAKE ONE TABLET BY MOUTH ONCE DAILY 02/23/16   [name] Mocha, MD  dicyclomine (BENTYL) 10 MG capsule TAKE ONE CAPSULE BY MOUTH THREE TIMES DAILY 07/30/16   [name] Lima, MD  dicyclomine (BENTYL) 20 MG tablet Take 1 tablet (20 mg total) by mouth 2 (two) times daily. Patient not taking: Reported on 01/23/2018 05/01/16   [name] [name], [name] Lines, MD  furosemide (LASIX) 20 MG tablet TAKE ONE TABLET BY MOUTH ONCE DAILY Patient not taking: Reported on 01/23/2018 07/30/16   [name] Lima, MD  gabapentin (NEURONTIN) 300 MG capsule TAKE ONE  CAPSULE BY MOUTH TWICE DAILY 07/30/16   [name] Lima, MD  lisinopril (PRINIVIL,ZESTRIL) 5 MG tablet Take 5 mg by mouth daily.    [provider]  losartan (COZAAR) 100 MG tablet Take 1 tablet (100 mg total) by mouth daily. Patient not taking: Reported on 01/23/2018 10/29/16   [name] Lima, MD  mesalamine (LIALDA) 1.2 g EC tablet Take 2 tablets (2.4 g total) by mouth daily with breakfast. Patient not taking: Reported on 01/23/2018 07/05/16   [name] Bears,  MD  metFORMIN (GLUCOPHAGE) 500 MG tablet Take 1 tablet by mouth 2 (two) times daily. 04/12/16   [provider]  metoprolol succinate (TOPROL-XL) 25 MG 24 hr tablet Take 25 mg by mouth daily.    [provider]  metoprolol tartrate (LOPRESSOR) 100 MG tablet TAKE ONE TABLET BY MOUTH TWICE DAILY Patient not taking: Reported on 01/23/2018 01/14/17   [name] Mocha, MD  nitroGLYCERIN (NITROSTAT) 0.4 MG SL tablet Place 1 tablet (0.4 mg total) under the tongue every 5 (five) minutes as needed. For chest pain 11/09/14   [name] Mocha, MD  Omega-3 Fatty Acids (FISH OIL) 1000 MG CAPS Take 2 capsules by mouth 4 (four) times daily.     [provider]  omeprazole (PRILOSEC) 20 MG capsule Take 1 capsule (20 mg total) by mouth 2 (two) times daily before a meal. appt needed for refills. Patient not taking: Reported on 01/23/2018 09/22/16   [name] Lima, MD  pantoprazole (PROTONIX) 40 MG tablet Take 40 mg by mouth daily.    [provider]  pravastatin (PRAVACHOL) 40 MG tablet TAKE ONE TABLET BY MOUTH IN THE EVENING Patient not taking: Reported on 01/23/2018 04/12/16   [name] Lima, MD  psyllium (REGULOID) 0.52 G capsule Take 0.52 g by mouth 2 (two) times daily.      [provider]  Red Yeast Rice 600 MG CAPS Take 1 capsule by mouth 2 (two) times daily.      [provider]  traMADol (ULTRAM) 50 MG tablet Take 1 tablet (50 mg total) by mouth every 6 (six) hours as needed for severe pain. 01/23/18    [name] Fast, MD  venlafaxine XR (EFFEXOR-XR) 37.5 MG 24 hr capsule TAKE ONE CAPSULE BY MOUTH ONCE DAILY WITH BREAKFAST 08/13/16   [name] Lima, MD  enoxaparin (LOVENOX) 40 MG/0.4ML SOLN Inject 0.4 mLs (40 mg total) into the skin daily. 09/28/11 11/28/11  [name] Bond, MD    Family History Family History  Problem Relation Age of Onset  . Heart disease Mother   . Pancreatic cancer Mother   . Cancer - Other Brother        vocal cord cancer  . Colon polyps Sister   . Coronary artery disease Other        sibling  . Coronary artery disease Other        sibling  . Colon cancer Neg Hx   . Esophageal cancer Neg Hx   . Rectal cancer Neg Hx   . Stomach cancer Neg Hx     Social History Social History   Tobacco Use  . Smoking status: Never Smoker  . Smokeless tobacco: Never Used  Substance Use Topics  . Alcohol use: No  . Drug use: No     Allergies   Niacin and related; Codeine; Prednisone; Sulfonamide derivatives; and Tetracycline   Review of Systems Review of Systems  Constitutional: Positive for fever.  Genitourinary: Positive for dysuria.  All other systems reviewed and are negative.    Physical Exam Updated Vital Signs BP (!) 144/67 (BP Location: Right Arm)   Pulse 70   Temp 99.6 F (37.6 C) (Oral)   Resp 17   Ht 5' (1.524 m)   Wt 54.4 kg (120 lb)   SpO2 92%   BMI 23.44 kg/m   Physical Exam  Constitutional: She is oriented to person, place, and time.  Slightly uncomfortable   HENT:  Head: Normocephalic.  Mouth/Throat: Oropharynx is clear and moist.  Eyes:  Pupils are equal, round, and reactive to light. Conjunctivae and EOM are normal.  Neck: Normal range of motion. Neck supple.  Cardiovascular: Normal rate, regular rhythm and normal heart sounds.  Pulmonary/Chest: Effort normal and breath sounds normal. No stridor. No respiratory distress.  Abdominal: Soft. Bowel sounds are normal. She exhibits no distension. There is no tenderness. There is no  guarding.  Musculoskeletal: Normal range of motion.  Ecchymosis inner L thigh, dec ROM but no obvious deformity. Some muscle mid thigh tenderness. No obvious calf tenderness. Compartments soft    Neurological: She is alert and oriented to person, place, and time. No cranial nerve deficit. Coordination normal.  Skin: Skin is warm.  Psychiatric: She has a normal mood and affect.  Nursing note and vitals reviewed.    ED Treatments / Results  Labs (all labs ordered are listed, but only abnormal results are displayed) Labs Reviewed  URINE CULTURE  CBC WITH DIFFERENTIAL/PLATELET  BASIC METABOLIC PANEL  CK  URINALYSIS, ROUTINE W REFLEX MICROSCOPIC  I-STAT CHEM 8, ED    EKG None  Radiology No results found.  Procedures Procedures (including critical care time)  Medications Ordered in ED Medications  morphine 4 MG/ML injection 4 mg (4 mg Intravenous Given 01/29/18 2139)     Initial Impression / Assessment and Plan / ED Course  I have reviewed the triage vital signs and the nursing notes.  Pertinent labs & imaging results that were available during my care of the patient were reviewed by me and considered in my medical decision making (see chart for details).     ERIKO ECONOMOS is a 77 y.o. female here with L thigh pain after fall a week ago. I think likely thigh hematoma. Consider occult fracture as well. Will get labs, CT femur, CK level. Will give pain meds as well. Has low grade temp and dysuria so will get UA as well.   11:35 PM CT showed intramuscular hematoma. UA + UTI. Given rocephin. Will dc home with keflex. She has tramadol at home but its not controlling the pain. Will add flexeril, prn vicodin. Will have her follow up with Dr. Percell [name] (she had previous knee replacement done there).    Final Clinical Impressions(s) / ED Diagnoses   Final diagnoses:  None    ED Discharge Orders    None       [name] Freeze, MD 01/29/18 2337

## 2018-01-29 NOTE — Discharge Instructions (Signed)
You can take flexeril for thigh hematoma. You may bear weight as tolerated. Expect that the bruising will travel down her leg as the week goes by.   Take vicodin for severe pain. You may take zofran as needed for nausea.   Take keflex three times daily for 5 days.   See Dr. Mardelle Matte in a week for follow up. Consider physical therapy.   Return to ER if you have worse thigh pain, trouble urinating, fever > 101, vomiting, unable to walk.

## 2018-01-29 NOTE — ED Triage Notes (Signed)
Patient reports that she fell 6 days ago and was seen in the ED. Patient states she is still hving difficulty walking due to pain of the left leg.

## 2018-02-01 LAB — URINE CULTURE: Culture: >=100000 — AB

## 2018-02-02 ENCOUNTER — Telehealth: Payer: Self-pay

## 2018-02-02 NOTE — Telephone Encounter (Signed)
Post ED Visit - Positive Culture Follow-up  Culture report reviewed by antimicrobial stewardship pharmacist:  []  Elenor Quinones, Pharm.D. []  Heide Guile, Pharm.D., BCPS AQ-ID [x]  Parks Neptune, Pharm.D., BCPS []  Alycia Rossetti, Pharm.D., BCPS []  [place], [place].D., BCPS, AAHIVP []  Legrand Como, Pharm.D., BCPS, AAHIVP []  Salome Arnt, PharmD, BCPS []  Wynell Balloon, PharmD []  Vincenza Hews, PharmD, BCPS  Positive urine culture Treated with Cephalexin, organism sensitive to the same and no further patient follow-up is required at this time.  Genia Del 02/02/2018, 9:59 AM

## 2018-06-18 ENCOUNTER — Telehealth: Payer: Self-pay | Admitting: Internal Medicine

## 2018-06-18 ENCOUNTER — Ambulatory Visit (INDEPENDENT_AMBULATORY_CARE_PROVIDER_SITE_OTHER): Payer: Medicare PPO

## 2018-06-18 DIAGNOSIS — Z23 Encounter for immunization: Secondary | ICD-10-CM | POA: Diagnosis not present

## 2018-06-18 NOTE — Telephone Encounter (Signed)
Yes, I will see her 

## 2018-06-18 NOTE — Telephone Encounter (Signed)
Patient [place] out of [place] when [place] got sick.  Spouse has since passed and has moved back.  She would like to continue care with Dr. Ronnald Ramp.  Please advise.

## 2018-06-19 NOTE — Telephone Encounter (Signed)
Patient scheduled.

## 2018-07-09 ENCOUNTER — Ambulatory Visit: Payer: Medicare PPO | Admitting: Internal Medicine

## 2018-07-15 ENCOUNTER — Encounter: Payer: Self-pay | Admitting: Internal Medicine

## 2018-07-15 ENCOUNTER — Other Ambulatory Visit (INDEPENDENT_AMBULATORY_CARE_PROVIDER_SITE_OTHER): Payer: PPO

## 2018-07-15 ENCOUNTER — Ambulatory Visit (INDEPENDENT_AMBULATORY_CARE_PROVIDER_SITE_OTHER): Payer: PPO | Admitting: Internal Medicine

## 2018-07-15 VITALS — BP 160/70 | HR 61 | Temp 98.0°F | Resp 16 | Ht 60.0 in | Wt 122.2 lb

## 2018-07-15 DIAGNOSIS — E785 Hyperlipidemia, unspecified: Secondary | ICD-10-CM | POA: Diagnosis not present

## 2018-07-15 DIAGNOSIS — E118 Type 2 diabetes mellitus with unspecified complications: Secondary | ICD-10-CM | POA: Diagnosis not present

## 2018-07-15 DIAGNOSIS — D539 Nutritional anemia, unspecified: Secondary | ICD-10-CM | POA: Diagnosis not present

## 2018-07-15 DIAGNOSIS — K2100 Gastro-esophageal reflux disease with esophagitis, without bleeding: Secondary | ICD-10-CM

## 2018-07-15 DIAGNOSIS — I2510 Atherosclerotic heart disease of native coronary artery without angina pectoris: Secondary | ICD-10-CM

## 2018-07-15 DIAGNOSIS — I10 Essential (primary) hypertension: Secondary | ICD-10-CM | POA: Diagnosis not present

## 2018-07-15 LAB — CBC WITH DIFFERENTIAL/PLATELET
BASOS PCT: 1.2 % (ref 0.0–3.0)
Basophils Absolute: 0.1 10*3/uL (ref 0.0–0.1)
EOS PCT: 2.4 % (ref 0.0–5.0)
Eosinophils Absolute: 0.2 10*3/uL (ref 0.0–0.7)
HEMATOCRIT: 41.6 % (ref 36.0–46.0)
Hemoglobin: 13.9 g/dL (ref 12.0–15.0)
LYMPHS ABS: 2.2 10*3/uL (ref 0.7–4.0)
LYMPHS PCT: 29 % (ref 12.0–46.0)
MCHC: 33.4 g/dL (ref 30.0–36.0)
MCV: 95.9 fl (ref 78.0–100.0)
MONOS PCT: 8.3 % (ref 3.0–12.0)
Monocytes Absolute: 0.6 10*3/uL (ref 0.1–1.0)
NEUTROS ABS: 4.5 10*3/uL (ref 1.4–7.7)
NEUTROS PCT: 59.1 % (ref 43.0–77.0)
PLATELETS: 313 10*3/uL (ref 150.0–400.0)
RBC: 4.34 Mil/uL (ref 3.87–5.11)
RDW: 13.5 % (ref 11.5–15.5)
WBC: 7.6 10*3/uL (ref 4.0–10.5)

## 2018-07-15 LAB — COMPREHENSIVE METABOLIC PANEL
ALT: 22 U/L (ref 0–35)
AST: 20 U/L (ref 0–37)
Albumin: 4.3 g/dL (ref 3.5–5.2)
Alkaline Phosphatase: 50 U/L (ref 39–117)
BILIRUBIN TOTAL: 0.5 mg/dL (ref 0.2–1.2)
BUN: 20 mg/dL (ref 6–23)
CO2: 33 mEq/L — ABNORMAL HIGH (ref 19–32)
CREATININE: 0.78 mg/dL (ref 0.40–1.20)
Calcium: 10.4 mg/dL (ref 8.4–10.5)
Chloride: 100 mEq/L (ref 96–112)
GFR: 75.78 mL/min (ref >60.00)
GLUCOSE: 120 mg/dL — AB (ref 70–99)
Potassium: 4.1 mEq/L (ref 3.5–5.1)
SODIUM: 141 meq/L (ref 135–145)
Total Protein: 7 g/dL (ref 6.0–8.3)

## 2018-07-15 LAB — LIPID PANEL
CHOL/HDL RATIO: 2
Cholesterol: 93 mg/dL (ref 0–200)
HDL: 47.4 mg/dL (ref >39.00)
LDL Cholesterol: 27 mg/dL (ref 0–99)
NONHDL: 45.53
Triglycerides: 95 mg/dL (ref 0.0–149.0)
VLDL: 19 mg/dL (ref 0.0–40.0)

## 2018-07-15 LAB — MICROALBUMIN / CREATININE URINE RATIO
Creatinine,U: 49.9 mg/dL
Microalb Creat Ratio: 5.1 mg/g (ref 0.0–30.0)
Microalb, Ur: 2.6 mg/dL — ABNORMAL HIGH (ref 0.0–1.9)

## 2018-07-15 LAB — URINALYSIS, ROUTINE W REFLEX MICROSCOPIC
Bilirubin Urine: NEGATIVE
Hgb urine dipstick: NEGATIVE
Ketones, ur: NEGATIVE
Nitrite: NEGATIVE
RBC / HPF: NONE SEEN (ref 0–?)
SPECIFIC GRAVITY, URINE: 1.01 (ref 1.000–1.030)
TOTAL PROTEIN, URINE-UPE24: NEGATIVE
Urine Glucose: NEGATIVE
Urobilinogen, UA: 0.2 (ref 0.0–1.0)
pH: 6.5 (ref 5.0–8.0)

## 2018-07-15 LAB — TSH: TSH: 2.43 u[IU]/mL (ref 0.35–4.50)

## 2018-07-15 LAB — IBC PANEL
Iron: 122 ug/dL (ref 42–145)
Saturation Ratios: 34.9 % (ref 20.0–50.0)
Transferrin: 250 mg/dL (ref 212.0–360.0)

## 2018-07-15 LAB — FERRITIN: FERRITIN: 32.7 ng/mL (ref 10.0–291.0)

## 2018-07-15 LAB — VITAMIN D 25 HYDROXY (VIT D DEFICIENCY, FRACTURES): VITD: 65.82 ng/mL (ref 30.00–100.00)

## 2018-07-15 LAB — VITAMIN B12: VITAMIN B 12: 581 pg/mL (ref 211–911)

## 2018-07-15 LAB — HEMOGLOBIN A1C: Hgb A1c MFr Bld: 6 % (ref 4.6–6.5)

## 2018-07-15 LAB — FOLATE

## 2018-07-15 MED ORDER — DEXLANSOPRAZOLE 60 MG PO CPDR: 60.0000 mg | DELAYED_RELEASE_CAPSULE | Freq: Every day | ORAL | 1 refills | Status: DC

## 2018-07-15 MED ORDER — AZILSARTAN MEDOXOMIL 80 MG PO TABS: 1.0000 | ORAL_TABLET | Freq: Every day | ORAL | 1 refills | Status: DC

## 2018-07-15 NOTE — Patient Instructions (Signed)

## 2018-07-15 NOTE — Progress Notes (Signed)
Subjective:  Patient ID: [name] [name], female    DOB: [birth year]  Age: 77 y.o. MRN: [id number]  CC: Anemia; Hypertension; Hyperlipidemia; and Diabetes   HPI [name] [name] presents for f/up - She returns to reestablish her primary care relationship.  She has been living in [place] for the last few years.  She tells me that about a year ago she developed chest pain and had a cardiac stent placed.  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  She complains of heartburn and says her current PPI is not controlling her symptoms.  She denies odynophagia, dysphagia, loss of appetite, or weight loss.  She is concerned that her blood pressure is not adequately well controlled.  Outpatient Medications Prior to Visit  Medication Sig Dispense Refill  . amLODipine (NORVASC) 5 MG tablet TAKE ONE TABLET BY MOUTH ONCE DAILY 30 tablet 11  . Ascorbic Acid (VITAMIN C) 1000 MG tablet Take 1,000 mg by mouth daily.      Marland Kitchen aspirin EC 81 MG tablet Take 81 mg by mouth every morning.     Marland Kitchen atorvastatin (LIPITOR) 80 MG tablet Take 80 mg by mouth every evening.     . calcium carbonate (OS-CAL) 600 MG TABS Take 600 mg by mouth 2 (two) times daily with a meal.      . Cholecalciferol (VITAMIN D3) 1000 UNITS CAPS Take 1 capsule by mouth daily.      . Cinnamon 500 MG capsule Take 500 mg by mouth 2 (two) times daily.     . clopidogrel (PLAVIX) 75 MG tablet TAKE ONE TABLET BY MOUTH ONCE DAILY 30 tablet 10  . cyclobenzaprine (FLEXERIL) 5 MG tablet Take 1 tablet (5 mg total) by mouth 3 (three) times daily as needed for muscle spasms. 10 tablet 0  . dicyclomine (BENTYL) 10 MG capsule TAKE ONE CAPSULE BY MOUTH THREE TIMES DAILY 90 capsule 11  . ezetimibe (ZETIA) 10 MG tablet Take by mouth.    . gabapentin (NEURONTIN) 300 MG capsule TAKE ONE CAPSULE BY MOUTH TWICE DAILY 180 capsule 1  . metoprolol succinate (TOPROL-XL) 25 MG  24 hr tablet Take 25 mg by mouth daily.    . Omega-3 Fatty Acids (FISH OIL) 1000 MG CAPS Take 2 capsules by mouth 4 (four) times daily.     Marland Kitchen venlafaxine XR (EFFEXOR-XR) 37.5 MG 24 hr capsule TAKE ONE CAPSULE BY MOUTH ONCE DAILY WITH BREAKFAST 30 capsule 11  . losartan (COZAAR) 100 MG tablet Take 1 tablet (100 mg total) by mouth daily. 90 tablet 1  . metFORMIN (GLUCOPHAGE) 500 MG tablet Take 1 tablet by mouth 2 (two) times daily.    . metoprolol tartrate (LOPRESSOR) 100 MG tablet TAKE ONE TABLET BY MOUTH TWICE DAILY 60 tablet 0  . pantoprazole (PROTONIX) 40 MG tablet Take 40 mg by mouth daily.    . cephALEXin (KEFLEX) 500 MG capsule Take 1 capsule (500 mg total) by mouth 3 (three) times daily. 15 capsule 0  . dicyclomine (BENTYL) 20 MG tablet Take 1 tablet (20 mg total) by mouth 2 (two) times daily. (Patient not taking: Reported on 01/23/2018) 60 tablet 2  . Doxylamine Succinate, Sleep, (SLEEP AID PO) Take 1 tablet by mouth at bedtime.    . furosemide (LASIX) 20 MG tablet TAKE ONE TABLET BY MOUTH ONCE DAILY (Patient not taking: Reported on 01/29/2018) 90 tablet 1  .  HYDROcodone-acetaminophen (NORCO/VICODIN) 5-325 MG tablet Take 1 tablet by mouth every 6 (six) hours as needed. 10 tablet 0  . lisinopril (PRINIVIL,ZESTRIL) 10 MG tablet Take 10 mg by mouth daily.   1  . mesalamine (LIALDA) 1.2 g EC tablet Take 2 tablets (2.4 g total) by mouth daily with breakfast. (Patient not taking: Reported on 01/23/2018) 60 tablet 3  . nitroGLYCERIN (NITROSTAT) 0.4 MG SL tablet Place 1 tablet (0.4 mg total) under the tongue every 5 (five) minutes as needed. For chest pain 25 tablet 2  . omeprazole (PRILOSEC) 20 MG capsule Take 1 capsule (20 mg total) by mouth 2 (two) times daily before a meal. appt needed for refills. (Patient not taking: Reported on 01/23/2018) 180 capsule 0  . ondansetron (ZOFRAN ODT) 4 MG disintegrating tablet 4mg  ODT q6 hours prn nausea/vomit 10 tablet 0  . pravastatin (PRAVACHOL) 40 MG tablet TAKE  ONE TABLET BY MOUTH IN THE EVENING (Patient not taking: Reported on 01/23/2018) 90 tablet 3  . psyllium (REGULOID) 0.52 G capsule Take 0.52 g by mouth 2 (two) times daily.      . Red Yeast Rice 600 MG CAPS Take 1 capsule by mouth 2 (two) times daily.      . traMADol (ULTRAM) 50 MG tablet Take 1 tablet (50 mg total) by mouth every 6 (six) hours as needed for severe pain. 15 tablet 0  . 0.9 %  sodium chloride infusion      No facility-administered medications prior to visit.     ROS Review of Systems  Constitutional: Negative.  Negative for appetite change, diaphoresis, fatigue and unexpected weight change.  HENT: Negative.  Negative for sore throat and trouble swallowing.   Eyes: Negative for visual disturbance.  Respiratory: Negative for cough, chest tightness, shortness of breath and wheezing.   Cardiovascular: Negative for chest pain, palpitations and leg swelling.  Gastrointestinal: Negative for abdominal pain, constipation, diarrhea, nausea and vomiting.  Endocrine: Negative.   Genitourinary: Negative.  Negative for difficulty urinating.  Musculoskeletal: Negative.  Negative for arthralgias and myalgias.  Skin: Negative.   Neurological: Negative.  Negative for dizziness, weakness, light-headedness and headaches.  Hematological: Negative for adenopathy. Does not bruise/bleed easily.  Psychiatric/Behavioral: Negative.     Objective:  BP (!) 160/70 (BP Location: Left Arm, Patient Position: Sitting, Cuff Size: Normal)   Pulse 61   Temp 98 F (36.7 C) (Oral)   Resp 16   Ht 5' (1.524 m)   Wt 122 lb 4 oz (55.5 kg)   SpO2 94%   BMI 23.88 kg/m   BP Readings from Last 3 Encounters:  07/15/18 (!) 160/70  01/29/18 (!) 144/67  01/23/18 111/67    Wt Readings from Last 3 Encounters:  07/15/18 122 lb 4 oz (55.5 kg)  01/29/18 120 lb (54.4 kg)  01/23/18 120 lb (54.4 kg)    Physical Exam  Constitutional: She is oriented to person, place, and time. No distress.  HENT:    Mouth/Throat: Oropharynx is clear and moist. No oropharyngeal exudate.  Eyes: Conjunctivae are normal. No scleral icterus.  Neck: Normal range of motion. Neck supple. No JVD present. No thyromegaly present.  Cardiovascular: Normal rate and regular rhythm. Exam reveals no gallop.  Murmur heard.  Systolic murmur is present with a grade of 1/6.  No diastolic murmur is present. 1/6 SEM RUSB  Pulmonary/Chest: Effort normal and breath sounds normal. No respiratory distress. She has no wheezes. She has no rales.  Abdominal: Soft. Bowel sounds are normal. She exhibits  no mass. There is no hepatosplenomegaly. There is no tenderness.  Musculoskeletal: Normal range of motion. She exhibits no edema, tenderness or deformity.  Lymphadenopathy:    She has no cervical adenopathy.  Neurological: She is alert and oriented to person, place, and time.  Skin: Skin is warm and dry. She is not diaphoretic. No pallor.  Vitals reviewed.   Lab Results  Component Value Date   WBC 7.6 07/15/2018   HGB 13.9 07/15/2018   HCT 41.6 07/15/2018   PLT 313.0 07/15/2018   GLUCOSE 120 (H) 07/15/2018   CHOL 93 07/15/2018   TRIG 95.0 07/15/2018   HDL 47.40 07/15/2018   LDLDIRECT 68.0 03/10/2015   LDLCALC 27 07/15/2018   ALT 22 07/15/2018   AST 20 07/15/2018   NA 141 07/15/2018   K 4.1 07/15/2018   CL 100 07/15/2018   CREATININE 0.78 07/15/2018   BUN 20 07/15/2018   CO2 33 (H) 07/15/2018   TSH 2.43 07/15/2018   INR 1.09 01/23/2018   HGBA1C 6.0 07/15/2018   MICROALBUR 2.6 (H) 07/15/2018    Ct Femur Left Wo Contrast  Result Date: 01/29/2018 CLINICAL DATA:  Acute onset of left thigh swelling. EXAM: CT OF THE LOWER LEFT EXTREMITY WITHOUT CONTRAST TECHNIQUE: Multidetector CT imaging of the lower left extremity was performed according to the standard protocol. COMPARISON:  CT of the left femur performed 01/23/2018 FINDINGS: Bones/Joint/Cartilage There is no evidence of fracture or dislocation. No osseous erosions  are seen. The left femoral head remains seated at the acetabulum. Mild subcortical cystic change is noted at the superior left acetabulum. 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 There is no evidence of loosening. The inferior aspect of the prosthesis is incompletely imaged. A small knee joint effusion is suspected. Ligaments Suboptimally assessed by CT. Muscles and Tendons Vague soft tissue edema is noted tracking about the proximal quadriceps musculature. Vague intramuscular hematoma is suggested at the lateral aspect of the quadriceps musculature. Underlying myositis cannot be excluded. No abscess is seen. Would correlate for recent traumatic injury. Soft tissues Diffuse mild soft tissue edema is noted tracking along the left thigh, with posterior sparing. The visualized portions of the pelvis are unremarkable, aside from scattered diverticulosis along the sigmoid colon. Postoperative and posttraumatic change is noted about the pubic symphysis. IMPRESSION: 1. No evidence of fracture or dislocation. 2. Vague intramuscular hematoma suggested at the lateral aspect of the quadriceps musculature. Underlying myositis cannot be excluded. Would correlate for recent traumatic injury. No evidence of abscess at this time. 3. Diffuse mild soft tissue edema tracking along the left thigh, with posterior sparing. 4. Small knee joint effusion suspected. 5. Scattered diverticulosis along the sigmoid colon. Electronically Signed   By: Garald Balding M.D.   On: 01/29/2018 22:33    Assessment & Plan:   Arlyce was seen today for anemia, hypertension, hyperlipidemia and diabetes.  Diagnoses and all orders for this visit:  Deficiency anemia- Her H&H are normal now.  I will screen her for vitamin deficiencies. -     CBC with Differential/Platelet; Future -     Vitamin B12; Future -     IBC panel; Future -     Vitamin B1; Future -     Folate; Future -     Ferritin; Future  Controlled type 2  diabetes mellitus with complication, without long-term current use of insulin ([place])- Her A1c is down to 6.0%.  Her blood sugars are well controlled.  I do not think she needs  to continue taking metformin. -     Comprehensive metabolic panel; Future -     Hemoglobin A1c; Future -     Microalbumin / creatinine urine ratio; Future -     Azilsartan Medoxomil (EDARBI) 80 MG TABS; Take 1 tablet (80 mg total) by mouth daily.  Atherosclerosis of native coronary artery of native heart without angina pectoris- She has been free from angina over the last year.  I have asked her to establish with a local cardiologist in [place]. -     Ambulatory referral to Cardiology  Essential hypertension- Her blood pressure is not adequately well controlled.  I will upgrade her to a more potent ARB.  Her labs are negative for secondary causes or endorgan damage.  Will continue the other antihypertensives without any changes. -     Comprehensive metabolic panel; Future -     Urinalysis, Routine w reflex microscopic; Future -     Azilsartan Medoxomil (EDARBI) 80 MG TABS; Take 1 tablet (80 mg total) by mouth daily. -     VITAMIN D 25 Hydroxy (Vit-D Deficiency, Fractures); Future  Hyperlipidemia with target LDL less than 100- She has achieved her LDL goal and is doing well on the statin. -     Lipid panel; Future -     Comprehensive metabolic panel; Future -     TSH; Future  Gastroesophageal reflux disease with esophagitis - I will upgrade her to a more potent PPI. -     dexlansoprazole (DEXILANT) 60 MG capsule; Take 1 capsule (60 mg total) by mouth daily.  Coronary artery disease involving native coronary artery of native heart without angina pectoris- As above. -     Azilsartan Medoxomil (EDARBI) 80 MG TABS; Take 1 tablet (80 mg total) by mouth daily. -     Ambulatory referral to Cardiology   I have discontinued Bianey F. Russell "FAYE"'s Red Yeast Rice, psyllium, nitroGLYCERIN, pravastatin, metFORMIN,  mesalamine, furosemide, omeprazole, losartan, metoprolol tartrate, pantoprazole, traMADol, lisinopril, (Doxylamine Succinate, Sleep, (SLEEP AID PO)), cephALEXin, HYDROcodone-acetaminophen, and ondansetron. I am also having her start on dexlansoprazole and Azilsartan Medoxomil. Additionally, I am having her maintain her Cinnamon, calcium carbonate, Fish Oil, Vitamin D3, vitamin C, aspirin EC, amLODipine, clopidogrel, gabapentin, dicyclomine, venlafaxine XR, metoprolol succinate, atorvastatin, cyclobenzaprine, and ezetimibe. We will stop administering sodium chloride.  Meds ordered this encounter  Medications  . dexlansoprazole (DEXILANT) 60 MG capsule    Sig: Take 1 capsule (60 mg total) by mouth daily.    Dispense:  90 capsule    Refill:  1  . Azilsartan Medoxomil (EDARBI) 80 MG TABS    Sig: Take 1 tablet (80 mg total) by mouth daily.    Dispense:  90 tablet    Refill:  1     Follow-up: Return in about 2 months (around 09/14/2018).  [name], MD

## 2018-07-17 NOTE — Telephone Encounter (Signed)
[name] [name] reports she needs to reestablish with [facility] because she needs to have oral surgery and will need to be cleared. She is no longer living in [place] and will need to be followed in [place]. Scheduled patient for evaluation 11/19. She understands her oral surgeon will need to fax over clearance request.  She was grateful for assistance.

## 2018-07-17 NOTE — Telephone Encounter (Signed)
Follow up:    Patient states that some called her yesterday from this office, she is returning the call. Please call back. [phone]

## 2018-07-19 LAB — VITAMIN B1: Vitamin B1 (Thiamine): 51 nmol/L — ABNORMAL HIGH (ref 8–30)

## 2018-07-22 ENCOUNTER — Encounter: Payer: Self-pay | Admitting: Physician Assistant

## 2018-07-22 ENCOUNTER — Ambulatory Visit: Payer: PPO | Admitting: Physician Assistant

## 2018-07-22 VITALS — BP 138/70 | HR 65 | Ht 60.0 in | Wt 119.4 lb

## 2018-07-22 DIAGNOSIS — I10 Essential (primary) hypertension: Secondary | ICD-10-CM

## 2018-07-22 DIAGNOSIS — R002 Palpitations: Secondary | ICD-10-CM | POA: Diagnosis not present

## 2018-07-22 DIAGNOSIS — E785 Hyperlipidemia, unspecified: Secondary | ICD-10-CM | POA: Diagnosis not present

## 2018-07-22 DIAGNOSIS — I25110 Atherosclerotic heart disease of native coronary artery with unstable angina pectoris: Secondary | ICD-10-CM

## 2018-07-22 NOTE — Progress Notes (Signed)
Cardiology Office Note    Date:  07/22/2018   ID:  [name] [name], DOB 12-May-1941, MRN [id number]  PCP:  Janith Lima, MD  Cardiologist:  Dr. Burt Knack   Chief Complaint:reestablish care  History of Present Illness:   [name] [name] is a 77 y.o. female with hx of CAD, HTN, HLD, DM, CVA and orthopedic issue presents for follow up.   The patient has a history of coronary artery disease with initial presentation in 2007 when she had an inferior wall STEMI. She was treated with primary PCI using a drug-eluting stent in the right coronary artery. LV function has been within normal limits. She underwent repeat heart catheterization in 2010 which showed moderate LAD stenosis stable from her previous study, patency of her RCA stent site, and a left ventricular ejection fraction of 65%. A followup Myoview scan in 2013 was done and showed no ischemia. Last seen by Dr. Burt Knack 01/2016.  Now presenting for cardiac care since moved back to [place] from [place]. She was admitted to [facility] for unstable angina and syncope. S/p  Resolute DES to [place] 07/15/17. Admitted to [facility] [place] 01/2018 for "heart infection" from teeth. ? Had afib at that time. She had PICC and was on abx for few months. Records requested from both hospital. She has intermittent epigastric/lowersteral pain since last PCI. Not exacerbated with activity. Now resolved since started on Desilant by PCP. She has intermittent palpitation. Occasionally occurs with chest pain. Denies orthopnea, PND, syncope, LE edema or melana.   Past Medical History:  Diagnosis Date  . Allergy   . Anxiety   . CAD (coronary artery disease)    1.  inf MI 2007 - tx with DES to RCA;  2.  Echocardiogram 08/2007: EF 60%, mild MR, mild LAE.  3.  Last LHC 08/2009: dLM 20%, LAD beyond diagonal 60-70%, stable from prior catheterization in 2007, ostial circumflex 40-50%, mid RCA stent okay, EF 65%. ;  4.  myoview was recommended 08/2009: EF 76%,  small inferoapical fixed defect, no ischemia  5.  Myoview 7/13: no scar or ischemia, EF 69%  . Cataract    removed both eyes  . Cerebrovascular disease, unspecified   . Chronic kidney disease    frequent Kidney Infections  . Chronic neck pain   . Chronic pain syndrome   . Diverticular disease   . Dizziness   . DJD (degenerative joint disease)   . DM type 2 (diabetes mellitus, type 2) ([place])   . GERD (gastroesophageal reflux disease)   . H/O hiatal hernia   . HTN (hypertension)   . IBS (irritable bowel syndrome)   . 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 Internal hemorrhoids   . Ischemic colitis ([place])   . Mitral valve prolapse   . Mixed hyperlipidemia   . Myocardial infarction ([place]) 2007  . Neuromuscular disorder ([place])    hiatal hernia  . Nocturia   . Pancreatitis    1955 an once more  . PONV (postoperative nausea and vomiting)    Difficluty opening mouth wide and turning head. (Cervical Fusion)  . Premature ventricular contractions   . Tubular adenoma of colon   . Ulcer    sam [facility]  . Urinary incontinence     Past Surgical History:  Procedure Laterality Date  . APPENDECTOMY  1955  . BLADDER REPAIR  2007  . CARDIAC CATHETERIZATION  2007   Stents  . CERVICAL SPINE SURGERY  07/2005  Dr Consuello Masse  . CHOLECYSTECTOMY  2007  . COLONOSCOPY    . DENTAL SURGERY  05/2013   replaced an inplant  . EYE SURGERY  2011   Bilteral  . I&D KNEE WITH POLY EXCHANGE  09/25/2011   Procedure: IRRIGATION AND DEBRIDEMENT KNEE WITH POLY EXCHANGE;  Surgeon: Johnny Bridge, MD;  Location: [place];  Service: Orthopedics;  Laterality: Left;  . INCISION AND DRAINAGE ABSCESS / HEMATOMA OF BURSA / KNEE / THIGH  09/2011  . JOINT REPLACEMENT  2012   left  . left Total Knee Replacement  11/2010   Dr Percell Miller  . NECK SURGERY     has had 3 surgeries  . POLYPECTOMY    . POSTERIOR CERVICAL FUSION/FORAMINOTOMY  04/08/2012   Procedure: POSTERIOR CERVICAL FUSION/FORAMINOTOMY LEVEL 2;   Surgeon: Otilio Connors, MD;  Location: [facility];  Service: Neurosurgery;  Laterality: Left;  Left Cervical six-seven Foraminotomy, bilateral cervical seven-thoracic one foraminotomy, cervical six-seven, cervical seven-thoracic one fusion with posterior instrumentation  . TEMPOROMANDIBULAR JOINT SURGERY    . thumb surgery    . TONSILLECTOMY    . TONSILLECTOMY  1950  . TOTAL ABDOMINAL HYSTERECTOMY      Current Medications: Prior to Admission medications   Medication Sig Start Date End Date Taking? Authorizing Provider  amLODipine (NORVASC) 5 MG tablet TAKE ONE TABLET BY MOUTH ONCE DAILY 01/23/16   Sherren Mocha, MD  Ascorbic Acid (VITAMIN C) 1000 MG tablet Take 1,000 mg by mouth daily.      [provider]  aspirin EC 81 MG tablet Take 81 mg by mouth every morning.     [provider]  atorvastatin (LIPITOR) 80 MG tablet Take 80 mg by mouth every evening.     [provider]  Azilsartan Medoxomil (EDARBI) 80 MG TABS Take 1 tablet (80 mg total) by mouth daily. 07/15/18   Janith Lima, MD  calcium carbonate (OS-CAL) 600 MG TABS Take 600 mg by mouth 2 (two) times daily with a meal.      [provider]  Cholecalciferol (VITAMIN D3) 1000 UNITS CAPS Take 1 capsule by mouth daily.      [provider]  Cinnamon 500 MG capsule Take 500 mg by mouth 2 (two) times daily.     [provider]  clopidogrel (PLAVIX) 75 MG tablet TAKE ONE TABLET BY MOUTH ONCE DAILY 02/23/16   Sherren Mocha, MD  cyclobenzaprine (FLEXERIL) 5 MG tablet Take 1 tablet (5 mg total) by mouth 3 (three) times daily as needed for muscle spasms. 01/29/18   Drenda Freeze, MD  dexlansoprazole (DEXILANT) 60 MG capsule Take 1 capsule (60 mg total) by mouth daily. 07/15/18   Janith Lima, MD  dicyclomine (BENTYL) 10 MG capsule TAKE ONE CAPSULE BY MOUTH THREE TIMES DAILY 07/30/16   Janith Lima, MD  ezetimibe (ZETIA) 10 MG tablet Take by mouth. 04/02/18   [provider]  gabapentin (NEURONTIN) 300 MG capsule TAKE ONE CAPSULE BY MOUTH TWICE DAILY 07/30/16   Janith Lima, MD  metoprolol succinate (TOPROL-XL) 25 MG 24 hr tablet Take 25 mg by mouth daily.    [provider]  Omega-3 Fatty Acids (FISH OIL) 1000 MG CAPS Take 2 capsules by mouth 4 (four) times daily.     [provider]  venlafaxine XR (EFFEXOR-XR) 37.5 MG 24 hr capsule TAKE ONE CAPSULE BY MOUTH ONCE DAILY WITH BREAKFAST 08/13/16   Janith Lima, MD  enoxaparin (LOVENOX) 40 MG/0.4ML  SOLN Inject 0.4 mLs (40 mg total) into the skin daily. 09/28/11 11/28/11  Marchia Bond, MD    Allergies:   Niacin and related; Codeine; Prednisone; Sulfonamide derivatives; and Tetracycline   Social History   Socioeconomic History  . Marital status: Widowed    Spouse name: dolly Heather  . Number of children: Not on file  . Years of education: Not on file  . Highest education level: Not on file  Occupational History    Employer: RETIRED  Social Needs  . Financial resource strain: Not on file  . Food insecurity:    Worry: Not on file    Inability: Not on file  . Transportation needs:    Medical: Not on file    Non-medical: Not on file  Tobacco Use  . Smoking status: Never Smoker  . Smokeless tobacco: Never Used  Substance and Sexual Activity  . Alcohol use: No  . Drug use: No  . Sexual activity: Yes  Lifestyle  . Physical activity:    Days per week: Not on file    Minutes per session: Not on file  . Stress: Not on file  Relationships  . Social connections:    Talks on phone: Not on file    Gets together: Not on file    Attends religious service: Not on file    Active member of club or organization: Not on file    Attends meetings of clubs or organizations: Not on file    Relationship status: Not on file  Other Topics Concern  . Not on file  Social History Narrative   Pt never knew her father   Daily caffeine use     Family History:  The patient's family  history includes Cancer - Other in her brother; Colon polyps in her sister; Coronary artery disease in her other and other; Heart disease in her mother; Pancreatic cancer in her mother.   ROS:   Please see the history of present illness.    ROS All other systems reviewed and are negative.   PHYSICAL EXAM:   VS:  BP 138/70   Pulse 65   Ht 5' (1.524 m)   Wt 119 lb 6.4 oz (54.2 kg)   SpO2 98%   BMI 23.32 kg/m    GEN: Well nourished, well developed, in no acute distress  HEENT: normal  Neck: no JVD, carotid bruits, or masses Cardiac: RRR;  Systolic murmurs, rubs, or gallops,no edema  Respiratory:  clear to auscultation bilaterally, normal work of breathing GI: soft, nontender, nondistended, + BS MS: no deformity or atrophy  Skin: warm and dry, no rash Neuro:  Alert and Oriented x 3, Strength and sensation are intact Psych: euthymic mood, full affect  Wt Readings from Last 3 Encounters:  07/22/18 119 lb 6.4 oz (54.2 kg)  07/15/18 122 lb 4 oz (55.5 kg)  01/29/18 120 lb (54.4 kg)      Studies/Labs Reviewed:   EKG:  EKG is ordered today.  The ekg ordered today demonstrates NSR at rate of 64 bpm  Recent Labs: 07/15/2018: ALT 22; BUN 20; Creatinine, Ser 0.78; Hemoglobin 13.9; Platelets 313.0; Potassium 4.1; Sodium 141; TSH 2.43   Lipid Panel    Component Value Date/Time   CHOL 93 07/15/2018 1024   TRIG 95.0 07/15/2018 1024   TRIG 164 (H) 06/24/2006 0738   HDL 47.40 07/15/2018 1024   CHOLHDL 2 07/15/2018 1024   VLDL 19.0 07/15/2018 1024   LDLCALC 27 07/15/2018 1024   LDLDIRECT 68.0  03/10/2015 1530    Additional studies/ records that were reviewed today include:   As above     ASSESSMENT & PLAN:    1. Chest pain with hx of CAD s/p DES in 2007 and recent  Resolute DES to [facility] 07/15/17 at outside hospital  - She continued to have ongoing chest pain since last PCI in 2018. Occurred with and without exertion. Not requiring SL nitro. She pain has been resolved in past  one month since started on Dexilant by PCP. She will call us if recurrent symptoms, consider stress test and imdur at that time. Continue ASA, Plavix, statin and BB.   2. Palpitation - ? Hx of atrial fibrillation when admitted for "heart infection". Never placed on anticoagulation. No syncope. Continue BB at current dose. Get 30 days event monitor.   3. ? Hx of Endocarditis - Records requested. She is scheduled to multiple teeth removal. Advised to wait until we gets records. She has Rx of antibiotic for SBE prophylaxis.   4. HLD - 07/15/2018: Cholesterol 93; HDL 47.40; LDL Cholesterol 27; Triglycerides 95.0; VLDL 19.0  - continue Lipitor 80mg  qd  5. DM - Per PCP  6. HTN - BP stable on current medications.     Medication Adjustments/Labs and Tests Ordered: Current medicines are reviewed at length with the patient today.  Concerns regarding medicines are outlined above.  Medication changes, Labs and Tests ordered today are listed in the Patient Instructions below. Patient Instructions  Medication Instructions:  1. Your physician recommends that you continue on your current medications as directed. Please refer to the Current Medication list given to you today.  If you need a refill on your cardiac medications before your next appointment, please call your pharmacy.   Lab work: NONE ORDERED TODAY If you have labs (blood work) drawn today and your tests are completely normal, you will receive your results only by: Marland Kitchen MyChart Message (if you have MyChart) OR . A paper copy in the mail If you have any lab test that is abnormal or we need to change your treatment, we will call you to review the results.  Testing/Procedures: Your physician has recommended that you wear an event monitor. Event monitors are medical devices that record the heart's electrical activity. Doctors most often [place] these monitors to diagnose arrhythmias. Arrhythmias are problems with the speed or rhythm of the  heartbeat. The monitor is a small, portable device. You can wear one while you do your normal daily activities. This is usually used to diagnose what is causing palpitations/syncope (passing out).    Follow-Up: At [facility], you and your health needs are our priority.  As part of our continuing mission to provide you with exceptional heart care, we have created designated Provider Care Teams.  These Care Teams include your primary Cardiologist (physician) and Advanced Practice Providers (APPs -  Physician Assistants and Nurse Practitioners) who all work together to provide you with the care you need, when you need it. You will need a follow up appointment in:  2 months.  Please call our office 2 months in advance to schedule this appointment.  You may see DR. Burt Knack or one of the following Advanced Practice Providers on your designated Care Team: Richardson Dopp, PA-C [place], [place] . Daune Perch, NP  Any Other Special Instructions Will Be Listed Below (If Applicable).       Jarrett Soho, PA  07/22/2018 12:04 PM    [place]  [address], [place], [place]  [address] Phone: [phone]; Fax: [phone]

## 2018-07-22 NOTE — Patient Instructions (Signed)
Medication Instructions:  1. Your physician recommends that you continue on your current medications as directed. Please refer to the Current Medication list given to you today.  If you need a refill on your cardiac medications before your next appointment, please call your pharmacy.   Lab work: NONE ORDERED TODAY If you have labs (blood work) drawn today and your tests are completely normal, you will receive your results only by: Marland Kitchen MyChart Message (if you have MyChart) OR . A paper copy in the mail If you have any lab test that is abnormal or we need to change your treatment, we will call you to review the results.  Testing/Procedures: Your physician has recommended that you wear an event monitor. Event monitors are medical devices that record the heart's electrical activity. Doctors most often [place] these monitors to diagnose arrhythmias. Arrhythmias are problems with the speed or rhythm of the heartbeat. The monitor is a small, portable device. You can wear one while you do your normal daily activities. This is usually used to diagnose what is causing palpitations/syncope (passing out).    Follow-Up: At [facility], you and your health needs are our priority.  As part of our continuing mission to provide you with exceptional heart care, we have created designated Provider Care Teams.  These Care Teams include your primary Cardiologist (physician) and Advanced Practice Providers (APPs -  Physician Assistants and Nurse Practitioners) who all work together to provide you with the care you need, when you need it. You will need a follow up appointment in:  2 months.  Please call our office 2 months in advance to schedule this appointment.  You may see DR. Burt Knack or one of the following Advanced Practice Providers on your designated Care Team: Richardson Dopp, PA-C [place], [place] . Daune Perch, NP  Any Other Special Instructions Will Be Listed Below (If Applicable).

## 2018-07-23 ENCOUNTER — Ambulatory Visit (INDEPENDENT_AMBULATORY_CARE_PROVIDER_SITE_OTHER): Payer: PPO

## 2018-07-23 DIAGNOSIS — R002 Palpitations: Secondary | ICD-10-CM | POA: Diagnosis not present

## 2018-08-06 ENCOUNTER — Telehealth: Payer: Self-pay

## 2018-08-06 NOTE — Telephone Encounter (Signed)
Spoke with pt about rescheduling appt on 1/20 @ 11:15 am due to [employer], PA-C being out that day. Pt new appt is on 1/21 @11 :15 am. Pt verbalized understanding.

## 2018-08-11 ENCOUNTER — Telehealth: Payer: Self-pay

## 2018-08-11 NOTE — Telephone Encounter (Signed)
Received [employer] heart monitor for the patient. Spoke with daughter, the patient is feeling fine and had no symptoms. The patient had 6 beats of V-tach. Spoke with Dr. Harrington Challenger (DOD), she reviewed and had no recommendations.

## 2018-09-01 ENCOUNTER — Telehealth: Payer: Self-pay | Admitting: *Deleted

## 2018-09-01 NOTE — Telephone Encounter (Signed)
-----   Message from [place], [place] sent at 09/01/2018 11:03 AM EST ----- No atrial fibrillation or flutter. No sustained arrhythmias. Follow up as schedule.

## 2018-09-01 NOTE — Telephone Encounter (Signed)
Pt returned my call and she has been made aware of her heart monitor results.

## 2018-09-01 NOTE — Telephone Encounter (Signed)
Called pt re: monitor results below, left a message for her to call back.

## 2018-09-04 ENCOUNTER — Ambulatory Visit (INDEPENDENT_AMBULATORY_CARE_PROVIDER_SITE_OTHER): Payer: PPO | Admitting: Internal Medicine

## 2018-09-04 ENCOUNTER — Encounter: Payer: Self-pay | Admitting: Internal Medicine

## 2018-09-04 VITALS — BP 126/78 | HR 77 | Temp 98.2°F | Ht 60.0 in | Wt 121.0 lb

## 2018-09-04 DIAGNOSIS — I10 Essential (primary) hypertension: Secondary | ICD-10-CM

## 2018-09-04 DIAGNOSIS — J019 Acute sinusitis, unspecified: Secondary | ICD-10-CM

## 2018-09-04 DIAGNOSIS — E118 Type 2 diabetes mellitus with unspecified complications: Secondary | ICD-10-CM

## 2018-09-04 MED ORDER — AZITHROMYCIN 250 MG PO TABS
ORAL_TABLET | ORAL | 1 refills | Status: DC
Start: 2018-09-04 — End: 2018-10-20

## 2018-09-04 MED ORDER — HYDROCODONE-HOMATROPINE 5-1.5 MG/5ML PO SYRP: 5.0000 mL | ORAL_SOLUTION | Freq: Four times a day (QID) | ORAL | 0 refills | Status: AC | PRN

## 2018-09-04 NOTE — Assessment & Plan Note (Signed)
Mild to mod, for antibx course,  to f/u any worsening symptoms or concerns 

## 2018-09-04 NOTE — Progress Notes (Signed)
Subjective:    Patient ID: [name] [name], female    DOB: 1941/01/03, 78 y.o.   MRN: [id number]  HPI   Here with 2-3 days acute onset fever, facial pain, pressure, headache, general weakness and malaise, and greenish d/c, with mild ST and cough, but pt denies chest pain, wheezing, increased sob or doe, orthopnea, PND, increased LE swelling, palpitations, dizziness or syncope.  Pt denies new neurological symptoms such as new headache, or facial or extremity weakness or numbness   Pt denies polydipsia, polyuria.  Past Medical History:  Diagnosis Date  . Allergy   . Anxiety   . CAD (coronary artery disease)    1.  inf MI 2007 - tx with DES to RCA;  2.  Echocardiogram 08/2007: EF 60%, mild MR, mild LAE.  3.  Last LHC 08/2009: dLM 20%, LAD beyond diagonal 60-70%, stable from prior catheterization in 2007, ostial circumflex 40-50%, mid RCA stent okay, EF 65%. ;  4.  myoview was recommended 08/2009: EF 76%, small inferoapical fixed defect, no ischemia  5.  Myoview 7/13: no scar or ischemia, EF 69%  . Cataract    removed both eyes  . Cerebrovascular disease, unspecified   . Chronic kidney disease    frequent Kidney Infections  . Chronic neck pain   . Chronic pain syndrome   . Diverticular disease   . Dizziness   . DJD (degenerative joint disease)   . DM type 2 (diabetes mellitus, type 2) ([place])   . GERD (gastroesophageal reflux disease)   . H/O hiatal hernia   . HTN (hypertension)   . IBS (irritable bowel syndrome)   . 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 Internal hemorrhoids   . Ischemic colitis ([employer])   . Mitral valve prolapse   . Mixed hyperlipidemia   . Myocardial infarction ([place]) 2007  . Neuromuscular disorder ([place])    hiatal hernia  . Nocturia   . Pancreatitis    1955 an once more  . PONV (postoperative nausea and vomiting)    Difficluty opening mouth wide and turning head. (Cervical Fusion)  . Premature ventricular contractions   . Tubular adenoma of colon     . Ulcer    sam [place] [facility]  . Urinary incontinence    Past Surgical History:  Procedure Laterality Date  . APPENDECTOMY  1955  . BLADDER REPAIR  2007  . CARDIAC CATHETERIZATION  2007   Stents  . CERVICAL SPINE SURGERY  07/2005   Dr Consuello Masse  . CHOLECYSTECTOMY  2007  . COLONOSCOPY    . DENTAL SURGERY  05/2013   replaced an inplant  . EYE SURGERY  2011   Bilteral  . I&D KNEE WITH POLY EXCHANGE  09/25/2011   Procedure: IRRIGATION AND DEBRIDEMENT KNEE WITH POLY EXCHANGE;  Surgeon: Johnny Bridge, MD;  Location: [place] [facility];  Service: Orthopedics;  Laterality: Left;  . INCISION AND DRAINAGE ABSCESS / HEMATOMA OF BURSA / KNEE / THIGH  09/2011  . JOINT REPLACEMENT  2012   left  . left Total Knee Replacement  11/2010   Dr Percell Miller  . NECK SURGERY     has had 3 surgeries  . POLYPECTOMY    . POSTERIOR CERVICAL FUSION/FORAMINOTOMY  04/08/2012   Procedure: POSTERIOR CERVICAL FUSION/FORAMINOTOMY LEVEL 2;  Surgeon: Otilio Connors, MD;  Location: [facility];  Service: Neurosurgery;  Laterality: Left;  Left Cervical six-seven Foraminotomy, bilateral cervical seven-thoracic one foraminotomy, cervical six-seven, cervical seven-thoracic one fusion with posterior instrumentation  .  TEMPOROMANDIBULAR JOINT SURGERY    . thumb surgery    . TONSILLECTOMY    . TONSILLECTOMY  1950  . TOTAL ABDOMINAL HYSTERECTOMY      reports that she has never smoked. She has never used smokeless tobacco. She reports that she does not drink alcohol or use drugs. family history includes Cancer - Other in her brother; Colon polyps in her sister; Coronary artery disease in some other family members; Heart disease in her mother; Pancreatic cancer in her mother. Allergies  Allergen Reactions  . Niacin And Related Other (See Comments)    Must take "Flush-free"  . Codeine Nausea Only  . Prednisone Itching and Rash  . Sulfonamide Derivatives Itching  . Tetracycline Itching and Rash   Current Outpatient Medications on File Prior to  Visit  Medication Sig Dispense Refill  . amLODipine (NORVASC) 5 MG tablet TAKE ONE TABLET BY MOUTH ONCE DAILY 30 tablet 11  . amoxicillin (AMOXIL) 500 MG capsule Take 1 capsule by mouth every 8 (eight) hours as needed.  1  . Ascorbic Acid (VITAMIN C) 1000 MG tablet Take 1,000 mg by mouth daily.      Marland Kitchen aspirin EC 81 MG tablet Take 81 mg by mouth every morning.     Marland Kitchen atorvastatin (LIPITOR) 80 MG tablet Take 80 mg by mouth every evening.     . Azilsartan Medoxomil (EDARBI) 80 MG TABS Take 1 tablet (80 mg total) by mouth daily. 90 tablet 1  . calcium carbonate (OS-CAL) 600 MG TABS Take 600 mg by mouth 2 (two) times daily with a meal.      . Cholecalciferol (VITAMIN D3) 1000 UNITS CAPS Take 1 capsule by mouth daily.      . Cinnamon 500 MG capsule Take 500 mg by mouth 2 (two) times daily.     . clopidogrel (PLAVIX) 75 MG tablet TAKE ONE TABLET BY MOUTH ONCE DAILY 30 tablet 10  . dexlansoprazole (DEXILANT) 60 MG capsule Take 1 capsule (60 mg total) by mouth daily. 90 capsule 1  . dicyclomine (BENTYL) 10 MG capsule TAKE ONE CAPSULE BY MOUTH THREE TIMES DAILY 90 capsule 11  . ezetimibe (ZETIA) 10 MG tablet Take by mouth.    . gabapentin (NEURONTIN) 300 MG capsule TAKE ONE CAPSULE BY MOUTH TWICE DAILY 180 capsule 1  . metFORMIN (GLUCOPHAGE) 500 MG tablet Take by mouth 2 (two) times daily with a meal.    . metoprolol succinate (TOPROL-XL) 25 MG 24 hr tablet Take 25 mg by mouth daily.    . Omega-3 Fatty Acids (FISH OIL) 1000 MG CAPS Take 2 capsules by mouth 4 (four) times daily.     Marland Kitchen venlafaxine XR (EFFEXOR-XR) 37.5 MG 24 hr capsule TAKE ONE CAPSULE BY MOUTH ONCE DAILY WITH BREAKFAST 30 capsule 11  . [DISCONTINUED] enoxaparin (LOVENOX) 40 MG/0.4ML SOLN Inject 0.4 mLs (40 mg total) into the skin daily. 5 Syringe 0   No current facility-administered medications on file prior to visit.    Review of Systems  Constitutional: Negative for other unusual diaphoresis or sweats HENT: Negative for ear discharge  or swelling Eyes: Negative for other worsening visual disturbances Respiratory: Negative for stridor or other swelling  Gastrointestinal: Negative for worsening distension or other blood Genitourinary: Negative for retention or other urinary change Musculoskeletal: Negative for other MSK pain or swelling Skin: Negative for color change or other new lesions Neurological: Negative for worsening tremors and other numbness  Psychiatric/Behavioral: Negative for worsening agitation or other fatigue All other system neg  per pt    Objective:   Physical Exam BP 126/78   Pulse 77   Temp 98.2 F (36.8 C) (Oral)   Ht 5' (1.524 m)   Wt 121 lb (54.9 kg)   SpO2 91%   BMI 23.63 kg/m  VS noted, mild ill Constitutional: Pt appears in NAD HENT: Head: NCAT.  Right Ear: External ear normal.  Left Ear: External ear normal.  Eyes: . Pupils are equal, round, and reactive to light. Conjunctivae and EOM are normal Bilat tm's with mild erythema.  Max sinus areas mild tender.  Pharynx with mild erythema, no exudate Nose: without d/c or deformity Neck: Neck supple. Gross normal ROM Cardiovascular: Normal rate and regular rhythm.   Pulmonary/Chest: Effort normal and breath sounds without rales or wheezing.  Neurological: Pt is alert. At baseline orientation, motor grossly intact Skin: Skin is warm. No rashes, other new lesions, no LE edema Psychiatric: Pt behavior is normal without agitation  No other exam findings Lab Results  Component Value Date   WBC 7.6 07/15/2018   HGB 13.9 07/15/2018   HCT 41.6 07/15/2018   PLT 313.0 07/15/2018   GLUCOSE 120 (H) 07/15/2018   CHOL 93 07/15/2018   TRIG 95.0 07/15/2018   HDL 47.40 07/15/2018   LDLDIRECT 68.0 03/10/2015   LDLCALC 27 07/15/2018   ALT 22 07/15/2018   AST 20 07/15/2018   NA 141 07/15/2018   K 4.1 07/15/2018   CL 100 07/15/2018   CREATININE 0.78 07/15/2018   BUN 20 07/15/2018   CO2 33 (H) 07/15/2018   TSH 2.43 07/15/2018   INR 1.09  01/23/2018   HGBA1C 6.0 07/15/2018   MICROALBUR 2.6 (H) 07/15/2018       Assessment & Plan:

## 2018-09-04 NOTE — Assessment & Plan Note (Signed)
stable overall by history and exam, recent data reviewed with pt, and pt to continue medical treatment as before,  to f/u any worsening symptoms or concerns  

## 2018-09-04 NOTE — Patient Instructions (Signed)
Please take all new medication as prescribed - the antibiotic, and cough medicine as needed  Please continue all other medications as before, and refills have been done if requested.  Please have the pharmacy call with any other refills you may need.  Please keep your appointments with your specialists as you may have planned   

## 2018-09-16 ENCOUNTER — Ambulatory Visit: Payer: PPO | Admitting: Internal Medicine

## 2018-09-22 ENCOUNTER — Ambulatory Visit: Payer: PPO | Admitting: Physician Assistant

## 2018-09-23 ENCOUNTER — Ambulatory Visit: Payer: PPO | Admitting: Physician Assistant

## 2018-10-03 DIAGNOSIS — J0100 Acute maxillary sinusitis, unspecified: Secondary | ICD-10-CM | POA: Diagnosis not present

## 2018-10-20 ENCOUNTER — Ambulatory Visit (INDEPENDENT_AMBULATORY_CARE_PROVIDER_SITE_OTHER): Payer: PPO | Admitting: Internal Medicine

## 2018-10-20 ENCOUNTER — Encounter: Payer: Self-pay | Admitting: Internal Medicine

## 2018-10-20 VITALS — BP 180/82 | HR 65 | Temp 98.2°F | Resp 16 | Ht 60.0 in | Wt 121.0 lb

## 2018-10-20 DIAGNOSIS — I2510 Atherosclerotic heart disease of native coronary artery without angina pectoris: Secondary | ICD-10-CM | POA: Diagnosis not present

## 2018-10-20 DIAGNOSIS — I10 Essential (primary) hypertension: Secondary | ICD-10-CM | POA: Diagnosis not present

## 2018-10-20 DIAGNOSIS — E118 Type 2 diabetes mellitus with unspecified complications: Secondary | ICD-10-CM | POA: Diagnosis not present

## 2018-10-20 MED ORDER — AZILSARTAN MEDOXOMIL 80 MG PO TABS: 1.0000 | ORAL_TABLET | Freq: Every day | ORAL | 1 refills | Status: DC

## 2018-10-20 NOTE — Progress Notes (Signed)
Subjective:  Patient ID: [name] [name], female    DOB: 21-Jul-1941  Age: 78 y.o. MRN: [id number]  CC: Hypertension   HPI [name] [name] presents for f/up - She feels well today and offers no complaints.  She ran out of the ARB about a week ago.  Since then her blood pressure has not been adequately well controlled.  Outpatient Medications Prior to Visit  Medication Sig Dispense Refill  . amLODipine (NORVASC) 5 MG tablet TAKE ONE TABLET BY MOUTH ONCE DAILY 30 tablet 11  . Ascorbic Acid (VITAMIN C) 1000 MG tablet Take 1,000 mg by mouth daily.      Marland Kitchen aspirin EC 81 MG tablet Take 81 mg by mouth every morning.     Marland Kitchen atorvastatin (LIPITOR) 80 MG tablet Take 80 mg by mouth every evening.     . calcium carbonate (OS-CAL) 600 MG TABS Take 600 mg by mouth 2 (two) times daily with a meal.      . Cholecalciferol (VITAMIN D3) 1000 UNITS CAPS Take 1 capsule by mouth daily.      . Cinnamon 500 MG capsule Take 500 mg by mouth 2 (two) times daily.     . clopidogrel (PLAVIX) 75 MG tablet TAKE ONE TABLET BY MOUTH ONCE DAILY 30 tablet 10  . dexlansoprazole (DEXILANT) 60 MG capsule Take 1 capsule (60 mg total) by mouth daily. 90 capsule 1  . dicyclomine (BENTYL) 10 MG capsule TAKE ONE CAPSULE BY MOUTH THREE TIMES DAILY 90 capsule 11  . ezetimibe (ZETIA) 10 MG tablet Take by mouth.    . gabapentin (NEURONTIN) 300 MG capsule TAKE ONE CAPSULE BY MOUTH TWICE DAILY 180 capsule 1  . metFORMIN (GLUCOPHAGE) 500 MG tablet Take by mouth 2 (two) times daily with a meal.    . metoprolol succinate (TOPROL-XL) 25 MG 24 hr tablet Take 25 mg by mouth daily.    . Omega-3 Fatty Acids (FISH OIL) 1000 MG CAPS Take 2 capsules by mouth 4 (four) times daily.     Marland Kitchen venlafaxine XR (EFFEXOR-XR) 37.5 MG 24 hr capsule TAKE ONE CAPSULE BY MOUTH ONCE DAILY WITH BREAKFAST 30 capsule 11  . amoxicillin (AMOXIL) 500 MG capsule Take 1 capsule by mouth every 8 (eight) hours as needed.  1  . Azilsartan Medoxomil (EDARBI) 80 MG TABS Take 1  tablet (80 mg total) by mouth daily. (Patient not taking: Reported on 10/20/2018) 90 tablet 1  . azithromycin (ZITHROMAX Z-PAK) 250 MG tablet 2 tab by mouth day 1, then 1 per day 6 tablet 1   No facility-administered medications prior to visit.     ROS Review of Systems  Constitutional: Negative.  Negative for diaphoresis, fatigue and unexpected weight change.  HENT: Negative.   Eyes: Negative for photophobia and visual disturbance.  Respiratory: Negative for cough, chest tightness, shortness of breath and wheezing.   Cardiovascular: Negative for chest pain, palpitations and leg swelling.  Gastrointestinal: Negative for abdominal pain, constipation, diarrhea, nausea and vomiting.  Genitourinary: Negative.  Negative for difficulty urinating.  Musculoskeletal: Negative.  Negative for arthralgias, back pain, myalgias and neck pain.  Skin: Negative for color change, pallor and rash.  Neurological: Negative.  Negative for dizziness, weakness, light-headedness and headaches.  Hematological: Negative for adenopathy. Does not bruise/bleed easily.  Psychiatric/Behavioral: Negative.     Objective:  BP (!) 180/82 (BP Location: Left Arm, Patient Position: Sitting, Cuff Size: Normal)   Pulse 65   Temp 98.2 F (36.8 C) (Oral)   Resp 16  Ht 5' (1.524 m)   Wt 121 lb (54.9 kg)   SpO2 95%   BMI 23.63 kg/m   BP Readings from Last 3 Encounters:  10/20/18 (!) 180/82  09/04/18 126/78  07/22/18 138/70    Wt Readings from Last 3 Encounters:  10/20/18 121 lb (54.9 kg)  09/04/18 121 lb (54.9 kg)  07/22/18 119 lb 6.4 oz (54.2 kg)    Physical Exam Vitals signs reviewed.  Constitutional:      Appearance: She is not ill-appearing or diaphoretic.  HENT:     Nose: Nose normal. No congestion or rhinorrhea.     Mouth/Throat:     Mouth: Mucous membranes are moist.     Pharynx: Oropharynx is clear. No oropharyngeal exudate or posterior oropharyngeal erythema.  Eyes:     General: No scleral  icterus.    Conjunctiva/sclera: Conjunctivae normal.  Neck:     Musculoskeletal: Normal range of motion and neck supple. No muscular tenderness.  Cardiovascular:     Rate and Rhythm: Normal rate and regular rhythm.     Pulses: Normal pulses.     Heart sounds: No murmur. No gallop.   Pulmonary:     Effort: Pulmonary effort is normal. No respiratory distress.     Breath sounds: No stridor. No wheezing, rhonchi or rales.  Abdominal:     General: Bowel sounds are normal.     Palpations: There is no hepatomegaly, splenomegaly or mass.     Tenderness: There is no abdominal tenderness.  Musculoskeletal: Normal range of motion.        General: No swelling.     Right lower leg: No edema.     Left lower leg: No edema.  Lymphadenopathy:     Cervical: No cervical adenopathy.  Skin:    General: Skin is warm and dry.     Coloration: Skin is not pale.     Findings: No erythema or rash.  Neurological:     General: No focal deficit present.     Mental Status: She is oriented to person, place, and time. Mental status is at baseline.     Lab Results  Component Value Date   WBC 7.6 07/15/2018   HGB 13.9 07/15/2018   HCT 41.6 07/15/2018   PLT 313.0 07/15/2018   GLUCOSE 120 (H) 07/15/2018   CHOL 93 07/15/2018   TRIG 95.0 07/15/2018   HDL 47.40 07/15/2018   LDLDIRECT 68.0 03/10/2015   LDLCALC 27 07/15/2018   ALT 22 07/15/2018   AST 20 07/15/2018   NA 141 07/15/2018   K 4.1 07/15/2018   CL 100 07/15/2018   CREATININE 0.78 07/15/2018   BUN 20 07/15/2018   CO2 33 (H) 07/15/2018   TSH 2.43 07/15/2018   INR 1.09 01/23/2018   HGBA1C 6.0 07/15/2018   MICROALBUR 2.6 (H) 07/15/2018    Ct Femur Left Wo Contrast  Result Date: 01/29/2018 CLINICAL DATA:  Acute onset of left thigh swelling. EXAM: CT OF THE LOWER LEFT EXTREMITY WITHOUT CONTRAST TECHNIQUE: Multidetector CT imaging of the lower left extremity was performed according to the standard protocol. COMPARISON:  CT of the left femur  performed 01/23/2018 FINDINGS: Bones/Joint/Cartilage There is no evidence of fracture or dislocation. No osseous erosions are seen. The left femoral head remains seated at the acetabulum. Mild subcortical cystic change is noted at the superior left acetabulum. 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 There is no evidence of loosening. The inferior aspect of the prosthesis is incompletely imaged. A small  knee joint effusion is suspected. Ligaments Suboptimally assessed by CT. Muscles and Tendons Vague soft tissue edema is noted tracking about the proximal quadriceps musculature. Vague intramuscular hematoma is suggested at the lateral aspect of the quadriceps musculature. Underlying myositis cannot be excluded. No abscess is seen. Would correlate for recent traumatic injury. Soft tissues Diffuse mild soft tissue edema is noted tracking along the left thigh, with posterior sparing. The visualized portions of the pelvis are unremarkable, aside from scattered diverticulosis along the sigmoid colon. Postoperative and posttraumatic change is noted about the pubic symphysis. IMPRESSION: 1. No evidence of fracture or dislocation. 2. Vague intramuscular hematoma suggested at the lateral aspect of the quadriceps musculature. Underlying myositis cannot be excluded. Would correlate for recent traumatic injury. No evidence of abscess at this time. 3. Diffuse mild soft tissue edema tracking along the left thigh, with posterior sparing. 4. Small knee joint effusion suspected. 5. Scattered diverticulosis along the sigmoid colon. Electronically Signed   By: Garald Balding M.D.   On: 01/29/2018 22:33    Assessment & Plan:   [name] [name] was seen today for hypertension.  Diagnoses and all orders for this visit:  Essential hypertension- Her blood pressure is not adequately well controlled.  I have asked her to restart the ARB.  I gave her samples of Edarbi and a prescription as well. -     Azilsartan  Medoxomil (EDARBI) 80 MG TABS; Take 1 tablet (80 mg total) by mouth daily.  Atherosclerosis of native coronary artery of native heart without angina pectoris- She has had no recent symptoms of angina.  I will continue to work on risk factor modifications. -     Azilsartan Medoxomil (EDARBI) 80 MG TABS; Take 1 tablet (80 mg total) by mouth daily.  Controlled type 2 diabetes mellitus with complication, without long-term current use of insulin- Her blood sugar has recently been well controlled.  (HCC) -     Azilsartan Medoxomil (EDARBI) 80 MG TABS; Take 1 tablet (80 mg total) by mouth daily.  Coronary artery disease involving native coronary artery of native heart without angina pectoris -     Azilsartan Medoxomil (EDARBI) 80 MG TABS; Take 1 tablet (80 mg total) by mouth daily.   I have discontinued [name] [name] "FAYE"'s amoxicillin and azithromycin. I am also having her maintain her Cinnamon, calcium carbonate, Fish Oil, Vitamin D3, vitamin C, aspirin EC, amLODipine, clopidogrel, gabapentin, dicyclomine, venlafaxine XR, metoprolol succinate, atorvastatin, ezetimibe, dexlansoprazole, metFORMIN, and Azilsartan Medoxomil.  Meds ordered this encounter  Medications  . Azilsartan Medoxomil (EDARBI) 80 MG TABS    Sig: Take 1 tablet (80 mg total) by mouth daily.    Dispense:  90 tablet    Refill:  1     Follow-up: Return in about 6 weeks (around 12/01/2018).  [name], MD

## 2018-10-20 NOTE — Patient Instructions (Signed)

## 2018-10-28 ENCOUNTER — Encounter: Payer: Self-pay | Admitting: Physician Assistant

## 2018-10-28 ENCOUNTER — Ambulatory Visit: Payer: PPO | Admitting: Physician Assistant

## 2018-10-28 VITALS — BP 140/60 | HR 68 | Ht 60.0 in | Wt 124.4 lb

## 2018-10-28 DIAGNOSIS — I2510 Atherosclerotic heart disease of native coronary artery without angina pectoris: Secondary | ICD-10-CM

## 2018-10-28 DIAGNOSIS — Z01810 Encounter for preprocedural cardiovascular examination: Secondary | ICD-10-CM

## 2018-10-28 DIAGNOSIS — Z8679 Personal history of other diseases of the circulatory system: Secondary | ICD-10-CM | POA: Diagnosis not present

## 2018-10-28 DIAGNOSIS — R0602 Shortness of breath: Secondary | ICD-10-CM

## 2018-10-28 DIAGNOSIS — I10 Essential (primary) hypertension: Secondary | ICD-10-CM

## 2018-10-28 NOTE — Patient Instructions (Signed)
Medication Instructions:  Your physician recommends that you continue on your current medications as directed. Please refer to the Current Medication list given to you today.   If you need a refill on your cardiac medications before your next appointment, please call your pharmacy.   Lab work: TODAY: BMET, CBC, BNP  If you have labs (blood work) drawn today and your tests are completely normal, you will receive your results only by: Marland Kitchen MyChart Message (if you have MyChart) OR . A paper copy in the mail If you have any lab test that is abnormal or we need to change your treatment, we will call you to review the results.  Testing/Procedures: Your physician has requested that you have an echocardiogram. Echocardiography is a painless test that uses sound waves to create images of your heart. It provides your doctor with information about the size and shape of your heart and how well your heart's chambers and valves are working. This procedure takes approximately one hour. There are no restrictions for this procedure.  Follow-Up: At [facility], you and your health needs are our priority.  As part of our continuing mission to provide you with exceptional heart care, we have created designated Provider Care Teams.  These Care Teams include your primary Cardiologist (physician) and Advanced Practice Providers (APPs -  Physician Assistants and Nurse Practitioners) who all work together to provide you with the care you need, when you need it. . You will need a follow up appointment in:  6 months.  Please call our office 2 months in advance to schedule this appointment.  You may see Sherren Mocha, MD or Richardson Dopp, PA-C   Any Other Special Instructions Will Be Listed Below (If Applicable).

## 2018-10-28 NOTE — Progress Notes (Signed)
Cardiology Office Note:    Date:  10/28/2018   ID:  [name] [name], DOB 09/02/1941, MRN [id number]  PCP:  Janith Lima, MD  Cardiologist:  Sherren Mocha, MD   Electrophysiologist:  None   Referring MD: Janith Lima, MD   Chief Complaint  Patient presents with  . Follow-up    CAD  . Shortness of Breath     History of Present Illness:    [name] [name] is a 78 y.o. female with coronary artery disease status post inferior MI in 2007 treated with a drug-eluting stent to the RCA, hypertension, hyperlipidemia, diabetes.  She was last seen by Dr. Burt Knack in 01/2016.  She has been followed at [facility] in [place], [place] since then.  She underwent Cardiac Catheterization at [facility] in [place] in 07/2017 that demonstrated progressive CAD in the mid and distal RCA at the site of the previous PCI. She had balloon angioplasty to the mid RCA stent and underwent placement of a Resolute DES to the distal RCA, overlapping with the prior stent.  There was mod disease in the mid LAD which was not hemodynamically significant by FFR.  She was admitted to [facility] in [place] in 09/2017 with TIA symptoms and a TEE demonstrated AoV vegetation.  She was place on IV antibiotics for 3 weeks.  She was last seen by Dr. Jamelle Haring at [facility] in 03/2018.  She moved back to [facility] and underwent follow up here with Leanor Kail, PA-C in 07/2018.  Event monitor was arranged for palpitations.  No atrial fibrillation was noted on this study.    [name] [name] returns for follow up.  She needs dental work done with Dr. Costella Hatcher in [place].  She has not had any chest pain since last seen.  She does note shortness of breath with exertion over the past several weeks.  Her shortness of breath has not gotten any worse.  She has not had paroxysmal nocturnal dyspnea, orthopnea, leg swelling, syncope.  She has not had melena or hematochezia.    Prior CV studies:   The  following studies were reviewed today:  Event Monitor 08/31/18 The basic rhythm is normal sinus with an average HR of 72 bpm There is one short run of NSVT - 6 beats There is no atrial fibrillation or flutter No sustained arrhythmia No pathologic pauses  TEE 09/26/17 ([place]) Mild LVH, normal LVSF, no RWMA, mild LAE, mobile structure on AoV extends into LVOT c/w SBE, mild AI, mod TR  Carotid US 09/24/17 ([place]) Conclusions: Mild plaque (<50%) is present in the bilateral internal carotid arteries that is not associated with a hemodynamically significant stenosis.  Antegrade flow with a normal hemodynamic profile was present in both vertebral arteries.  Echo ([place]) 09/24/17 Mild conc LVH, normal LVSF, no RWMA, mild LAE, mobile linear structure in LVOT side of AoV (Lambl's excrescence vs vegetation), mod TR, mild pulmonary HTN, mild PI  Cardiac Catheterization 07/15/17 [facility] in [place], [place]) [place] dist 20 LAD prox 50 (FFR 0.94 - no hemodynamically significant) LCx prox 30; OM1 prox 60 RCA prox 20, mid stent patent with 50 ISR, dist 80 at edge of prior stent PCI:  POBA to mid RCA ISR PCI:  2.5 x 12 mm Resolute DES to dist RCA overlapping with prior stent  Echo 07/15/17 [facility] in [place], [place]) Borderline conc LVH, EF 55-60, Gr 1 DD, mild LAE, trivial MR, mild AoV sclerosis, trivial AI, mild  to mod TR, RVSP 40-45 (mild pulmo HTN)  Myoview 03/05/12 Overall Impression:  Normal stress nuclear study. LV Ejection Fraction: 69%.  LV Wall Motion:  NL LV Function; NL Wall Motion   Past Medical History:  Diagnosis Date  . Allergy   . Anxiety   . CAD (coronary artery disease)    s/p inf MI 2007 - tx w/ DES to RCA // Echo 12/08: EF 60%, mild MR, mild LAE // LHC 08/2009: dLM 20%, LAD beyond diagonal 60-70%, stable from prior catheterization in 2007, ostial circumflex 40-50%, mid RCA stent ok, EF 65% // Myoview 08/2009: EF 76%, small  inferoapical fixed defect, no ischemia // Myoview 7/13: no scar or ischemia, EF 69% //    . Cataract    removed both eyes  . Cerebrovascular disease, unspecified   . Chronic kidney disease    frequent Kidney Infections  . Chronic neck pain   . Chronic pain syndrome   . Diverticular disease   . Dizziness   . DJD (degenerative joint disease)   . DM type 2 (diabetes mellitus, type 2) ([place])   . GERD (gastroesophageal reflux disease)   . H/O hiatal hernia   . HTN (hypertension)   . IBS (irritable bowel syndrome)   . 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 Internal hemorrhoids   . Ischemic colitis ([place])   . Mitral valve prolapse   . Mixed hyperlipidemia   . Myocardial infarction ([place]) 2007  . Neuromuscular disorder ([place])    hiatal hernia  . Nocturia   . Pancreatitis    1955 an once more  . PONV (postoperative nausea and vomiting)    Difficluty opening mouth wide and turning head. (Cervical Fusion)  . Premature ventricular contractions   . Tubular adenoma of colon   . Ulcer    [facility]  . Urinary incontinence    Surgical Hx: The patient  has a past surgical history that includes Cervical spine surgery (07/2005); Bladder repair (2007); Total abdominal hysterectomy; Tonsillectomy; thumb surgery; Neck surgery; Temporomandibular joint surgery; left Total Knee Replacement (11/2010); I&D knee with poly exchange (09/25/2011); Cardiac catheterization (2007); Appendectomy (1955); Tonsillectomy (1950); Cholecystectomy (2007); Eye surgery (2011); Joint replacement (2012); Incision and drainage abscess / hematoma of bursa / knee / thigh (09/2011); Posterior cervical fusion/foraminotomy (04/08/2012); Dental surgery (05/2013); Polypectomy; and Colonoscopy.   Current Medications: Current Meds  Medication Sig  . amLODipine (NORVASC) 5 MG tablet TAKE ONE TABLET BY MOUTH ONCE DAILY  . Ascorbic Acid (VITAMIN C) 1000 MG tablet Take 1,000 mg by mouth daily.    Marland Kitchen aspirin EC 81 MG tablet  Take 81 mg by mouth every morning.   Marland Kitchen atorvastatin (LIPITOR) 80 MG tablet Take 80 mg by mouth every evening.   . Azilsartan Medoxomil (EDARBI) 80 MG TABS Take 1 tablet (80 mg total) by mouth daily.  . calcium carbonate (OS-CAL) 600 MG TABS Take 600 mg by mouth 2 (two) times daily with a meal.    . Cholecalciferol (VITAMIN D3) 1000 UNITS CAPS Take 1 capsule by mouth daily.    . Cinnamon 500 MG capsule Take 500 mg by mouth 2 (two) times daily.   . clopidogrel (PLAVIX) 75 MG tablet TAKE ONE TABLET BY MOUTH ONCE DAILY  . dexlansoprazole (DEXILANT) 60 MG capsule Take 1 capsule (60 mg total) by mouth daily.  Marland Kitchen dicyclomine (BENTYL) 10 MG capsule TAKE ONE CAPSULE BY MOUTH THREE TIMES DAILY  . ezetimibe (ZETIA) 10 MG tablet Take by mouth.  Marland Kitchen  gabapentin (NEURONTIN) 300 MG capsule TAKE ONE CAPSULE BY MOUTH TWICE DAILY  . metFORMIN (GLUCOPHAGE) 500 MG tablet Take by mouth 2 (two) times daily with a meal.  . metoprolol succinate (TOPROL-XL) 25 MG 24 hr tablet Take 25 mg by mouth daily.  . Omega-3 Fatty Acids (FISH OIL) 1000 MG CAPS Take 2 capsules by mouth 4 (four) times daily.   Marland Kitchen venlafaxine XR (EFFEXOR-XR) 37.5 MG 24 hr capsule TAKE ONE CAPSULE BY MOUTH ONCE DAILY WITH BREAKFAST     Allergies:   Niacin and related; Codeine; Prednisone; Sulfonamide derivatives; and Tetracycline   Social History   Tobacco Use  . Smoking status: Never Smoker  . Smokeless tobacco: Never Used  Substance Use Topics  . Alcohol use: No  . Drug use: No     Family Hx: The patient's family history includes Cancer - Other in her brother; Colon polyps in her sister; Coronary artery disease in some other family members; Heart disease in her mother; Pancreatic cancer in her mother. There is no history of Colon cancer, Esophageal cancer, Rectal cancer, or Stomach cancer.  ROS:   Please see the history of present illness.    ROS All other systems reviewed and are negative.   EKGs/Labs/Other Test Reviewed:    EKG:  EKG  is  ordered today.  The ekg ordered today demonstrates normal sinus rhythm, HR 68, LAD, QTc 431, no changes.   Recent Labs: 07/15/2018: ALT 22; BUN 20; Creatinine, Ser 0.78; Hemoglobin 13.9; Platelets 313.0; Potassium 4.1; Sodium 141; TSH 2.43   Recent Lipid Panel Lab Results  Component Value Date/Time   CHOL 93 07/15/2018 10:24 AM   TRIG 95.0 07/15/2018 10:24 AM   TRIG 164 (H) 06/24/2006 07:38 AM   HDL 47.40 07/15/2018 10:24 AM   CHOLHDL 2 07/15/2018 10:24 AM   LDLCALC 27 07/15/2018 10:24 AM   LDLDIRECT 68.0 03/10/2015 03:30 PM    Physical Exam:    VS:  BP 140/60   Pulse 68   Ht 5' (1.524 m)   Wt 124 lb 6.4 oz (56.4 kg)   SpO2 96%   BMI 24.30 kg/m     Wt Readings from Last 3 Encounters:  10/28/18 124 lb 6.4 oz (56.4 kg)  10/20/18 121 lb (54.9 kg)  09/04/18 121 lb (54.9 kg)     Physical Exam  Constitutional: She is oriented to person, place, and time. She appears well-developed and well-nourished. No distress.  HENT:  Head: Normocephalic and atraumatic.  Eyes: No scleral icterus.  Neck: Neck supple. No JVD present. No thyromegaly present.  Cardiovascular: Normal rate, regular rhythm, S1 normal and S2 normal.  Murmur heard.  Systolic murmur is present with a grade of 2/6 at the upper right sternal border and upper left sternal border. Pulmonary/Chest: Effort normal. She has no rales.  Abdominal: Soft. There is no hepatomegaly.  Musculoskeletal:        General: No edema.  Lymphadenopathy:    She has no cervical adenopathy.  Neurological: She is alert and oriented to person, place, and time.  Skin: Skin is warm and dry.  Psychiatric: She has a normal mood and affect.    ASSESSMENT & PLAN:    Coronary artery disease involving native coronary artery of native heart without angina pectoris Hx of inferior MI in 2007 tx with DES to the RCA.  She underwent POBA to the mid RCA stent and DES to the distal RCA (overlapping with the prior stent) in 07/2017 in [place]. She  is  not having any angina.  She does note shortness of breath with exertion.  Her ECG is unchanged.  At this point, I do not believe that she needs ischemic testing.  She has multiple stents in the RCA.  She seems to be tolerating dual antiplatelet Rx.  Continue ASA, Plavix, beta-blocker, statin.  Shortness of breath Etiology not clear.  She does not appear to be volume overloaded.  Her ECG is unchanged.  I will obtain a CBC, BMET, BNP today and arrange an echocardiogram.  If her workup is unremarkable and she continues to have symptoms or her symptoms worsen, we will need to see her back sooner to consider further testing.    Essential hypertension She was out of Azilsartan until recently.  She has been back on it 1 week.  Continue current Rx and continue to monitor for now.  If her BP remains > 130/80, consider increasing Amlodipine.    History of bacterial endocarditis  Obtain follow up echocardiogram as noted.  She will need to take antibiotics prior to any dental procedure.  Preoperative cardiovascular examination Simple dental extraction is considered low risk and she does not need further testing.  She may proceed with her dental procedure at acceptable risk.  She knows to take antibiotics prior to any dental work given her hx of endocarditis in the past.     Dispo:  Return in about 6 months (around 04/28/2019) for Routine Follow Up, w/ Dr. Burt Knack, or Richardson Dopp, PA-C.   Medication Adjustments/Labs and Tests Ordered: Current medicines are reviewed at length with the patient today.  Concerns regarding medicines are outlined above.  Tests Ordered: Orders Placed This Encounter  Procedures  . Basic metabolic panel  . CBC  . Pro b natriuretic peptide (BNP)  . EKG 12-Lead  . ECHOCARDIOGRAM COMPLETE   Medication Changes: No orders of the defined types were placed in this encounter.   Signed, Richardson Dopp, PA-C  10/28/2018 5:38 PM    [facility] [address], [address], [place]  [address] Phone: [phone]; Fax: [phone]

## 2018-10-29 LAB — CBC
Hematocrit: 40.3 % (ref 34.0–46.6)
Hemoglobin: 12.9 g/dL (ref 11.1–15.9)
MCH: 31.6 pg (ref 26.6–33.0)
MCHC: 32 g/dL (ref 31.5–35.7)
MCV: 99 fL — ABNORMAL HIGH (ref 79–97)
Platelets: 349 10*3/uL (ref 150–450)
RBC: 4.08 x10E6/uL (ref 3.77–5.28)
RDW: 12.5 % (ref 11.7–15.4)
WBC: 8.9 10*3/uL (ref 3.4–10.8)

## 2018-10-29 LAB — BASIC METABOLIC PANEL
BUN/Creatinine Ratio: 19 (ref 12–28)
BUN: 15 mg/dL (ref 8–27)
CALCIUM: 9.2 mg/dL (ref 8.7–10.3)
CO2: 26 mmol/L (ref 20–29)
Chloride: 100 mmol/L (ref 96–106)
Creatinine, Ser: 0.81 mg/dL (ref 0.57–1.00)
GFR calc Af Amer: 80 mL/min/{1.73_m2} (ref >59)
GFR calc non Af Amer: 69 mL/min/{1.73_m2} (ref >59)
Glucose: 124 mg/dL — ABNORMAL HIGH (ref 65–99)
Potassium: 4.3 mmol/L (ref 3.5–5.2)
Sodium: 141 mmol/L (ref 134–144)

## 2018-10-29 LAB — PRO B NATRIURETIC PEPTIDE: NT-Pro BNP: 626 pg/mL (ref 0–738)

## 2018-10-31 ENCOUNTER — Ambulatory Visit (HOSPITAL_COMMUNITY): Payer: PPO | Attending: Cardiovascular Disease

## 2018-10-31 ENCOUNTER — Encounter (HOSPITAL_COMMUNITY): Payer: Self-pay | Admitting: *Deleted

## 2018-10-31 DIAGNOSIS — Z8679 Personal history of other diseases of the circulatory system: Secondary | ICD-10-CM

## 2018-10-31 DIAGNOSIS — I2510 Atherosclerotic heart disease of native coronary artery without angina pectoris: Secondary | ICD-10-CM | POA: Diagnosis not present

## 2018-10-31 DIAGNOSIS — R0602 Shortness of breath: Secondary | ICD-10-CM | POA: Diagnosis not present

## 2018-10-31 NOTE — Progress Notes (Unsigned)
Patient ID: [name] [name], female   DOB: 06-22-1941, 78 y.o.   MRN: [id number]   DOD Dr. Harrington Challenger consulted about findings from echocardiogram.  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  Images will be requested from prior studies from [place], patient signed release before leaving.  Dr. Harrington Challenger will follow up when Disc with prior studies can be reviewed.    Deliah Boston, RDCS

## 2018-11-02 ENCOUNTER — Encounter: Payer: Self-pay | Admitting: Physician Assistant

## 2018-11-04 ENCOUNTER — Telehealth: Payer: Self-pay | Admitting: Cardiovascular Disease

## 2018-11-04 NOTE — Telephone Encounter (Signed)
Medical records requested from [facility]. 11/04/18 vlm

## 2018-11-06 ENCOUNTER — Other Ambulatory Visit: Payer: Self-pay | Admitting: *Deleted

## 2018-11-06 ENCOUNTER — Encounter: Payer: Self-pay | Admitting: Physician Assistant

## 2018-11-06 DIAGNOSIS — R0602 Shortness of breath: Secondary | ICD-10-CM

## 2018-11-06 DIAGNOSIS — I2510 Atherosclerotic heart disease of native coronary artery without angina pectoris: Secondary | ICD-10-CM

## 2018-11-10 ENCOUNTER — Telehealth: Payer: Self-pay | Admitting: Cardiovascular Disease

## 2018-11-10 NOTE — Telephone Encounter (Signed)
Medical record cds received from [facility] [employer]. 11/10/18 vlm

## 2018-11-12 ENCOUNTER — Other Ambulatory Visit (HOSPITAL_COMMUNITY): Payer: PPO

## 2018-11-27 ENCOUNTER — Encounter: Payer: Self-pay | Admitting: Internal Medicine

## 2018-12-01 ENCOUNTER — Ambulatory Visit: Payer: PPO | Admitting: Internal Medicine

## 2018-12-01 ENCOUNTER — Encounter: Payer: Self-pay | Admitting: Internal Medicine

## 2018-12-01 ENCOUNTER — Ambulatory Visit (INDEPENDENT_AMBULATORY_CARE_PROVIDER_SITE_OTHER): Payer: PPO | Admitting: Internal Medicine

## 2018-12-01 DIAGNOSIS — I2510 Atherosclerotic heart disease of native coronary artery without angina pectoris: Secondary | ICD-10-CM

## 2018-12-01 DIAGNOSIS — E118 Type 2 diabetes mellitus with unspecified complications: Secondary | ICD-10-CM | POA: Diagnosis not present

## 2018-12-01 DIAGNOSIS — I10 Essential (primary) hypertension: Secondary | ICD-10-CM | POA: Diagnosis not present

## 2018-12-01 MED ORDER — METOPROLOL SUCCINATE ER 25 MG PO TB24: 25.0000 mg | ORAL_TABLET | Freq: Every day | ORAL | 1 refills | Status: DC

## 2018-12-01 MED ORDER — AZILSARTAN MEDOXOMIL 80 MG PO TABS: 1.0000 | ORAL_TABLET | Freq: Every day | ORAL | 1 refills | Status: DC

## 2018-12-01 MED ORDER — AMLODIPINE BESYLATE 5 MG PO TABS: 5.0000 mg | ORAL_TABLET | Freq: Every day | ORAL | 1 refills | Status: DC

## 2018-12-01 NOTE — Progress Notes (Signed)
Virtual Visit via Video Note  I connected with [name] [name] on 12/01/18 at  2:00 PM EDT by a video enabled telemedicine application and verified that I am speaking with the correct person using two identifiers.   I discussed the limitations of evaluation and management by telemedicine and the availability of in person appointments. The patient expressed understanding and agreed to proceed.  History of Present Illness: She called for a virtual appointment for blood pressure check.  She had been concerned recently that she has had some systolic spikes up into the mid to upper 150s.  She has also had a few headaches and now reports a low-grade fever for the last few days.  She denies cough, shortness of breath, sore throat, or other symptoms.  I tried to ascertain whether or not she was compliant with the beta-blocker, calcium channel blocker, and ARB but she is a difficult historian.    Observations/Objective: She was alert, oriented, and did not show any signs of distress.  She appeared to be walking around her home.  Voice was normal.   Assessment and Plan: It sounds like her blood pressure is not quite adequately well controlled and she has had some headaches.  I have sent in refills to her pharmacy in [place] to be certain that she is taking the beta-blocker, calcium channel blocker, and ARB.  I also enlisted her daughter to help with compliance and with the history.  She has had a fever recently but no cough so the suspicion for COVID-19 infection is low.   Follow Up Instructions: Regarding the low-grade fever I have asked her to report to a local hospital if she develops worsening symptoms or cough.  If her symptoms are not very severe she can self quarantine in her home.  I educated her regarding COVID-19 infection.  She also agrees to be compliant with her 3 antihypertensives and to continue monitoring her blood pressure.    I discussed the assessment and treatment plan with the patient.  The patient was provided an opportunity to ask questions and all were answered. The patient agreed with the plan and demonstrated an understanding of the instructions.   The patient was advised to call back or seek an in-person evaluation if the symptoms worsen or if the condition fails to improve as anticipated.  I provided 20 minutes of non-face-to-face time during this encounter.   [name], MD

## 2018-12-04 ENCOUNTER — Other Ambulatory Visit: Payer: Self-pay | Admitting: Internal Medicine

## 2018-12-04 ENCOUNTER — Telehealth: Payer: Self-pay

## 2018-12-04 DIAGNOSIS — E118 Type 2 diabetes mellitus with unspecified complications: Secondary | ICD-10-CM

## 2018-12-04 DIAGNOSIS — I10 Essential (primary) hypertension: Secondary | ICD-10-CM

## 2018-12-04 DIAGNOSIS — I2510 Atherosclerotic heart disease of native coronary artery without angina pectoris: Secondary | ICD-10-CM

## 2018-12-04 MED ORDER — OLMESARTAN MEDOXOMIL 40 MG PO TABS: 40.0000 mg | ORAL_TABLET | Freq: Every day | ORAL | 1 refills | Status: DC

## 2018-12-04 NOTE — Telephone Encounter (Signed)
Copied from [place] [id number]. Topic: General - Other >> Dec 04, 2018 10:12 AM Carolyn Stare wrote:    Azilsartan Medoxomil (EDARBI) 80 MG TABS     metoprolol succinate (TOPROL-XL) 25 MG 24 hr tablet Pt was given a rx of 100mg  from another provider but 25mg  was called in by Dr Ronnald Ramp  Pt daughter [facility] call and they have question about the above medciations would like a call back

## 2018-12-04 NOTE — Telephone Encounter (Signed)
Spoke to pt dtr and she states the following:   Pt has been taking Metoprolol 100 mg bid (rx'ed by a Dr. Richarda Osmond) and the Edarbi 80 qd. She just realized that the medication list shows Metoprolol 25 mg and pt has not been taking that.   The cost of the Earnest Rosier is too expensive.   Please advise if there is an alternative to the [place].

## 2018-12-04 NOTE — Telephone Encounter (Signed)
Spoke to pt dtr and informed that the olmesartan has been sent in.

## 2018-12-04 NOTE — Telephone Encounter (Signed)
New RX sent in to replace [facility]

## 2018-12-12 DIAGNOSIS — N3000 Acute cystitis without hematuria: Secondary | ICD-10-CM | POA: Diagnosis not present

## 2019-01-22 ENCOUNTER — Encounter: Payer: Self-pay | Admitting: *Deleted

## 2019-01-22 ENCOUNTER — Telehealth: Payer: Self-pay | Admitting: *Deleted

## 2019-01-22 NOTE — Telephone Encounter (Signed)
   [facility] Pre-operative Risk Assessment    Request for surgical clearance:  1. What type of surgery is being performed? DENTAL EXTRACTIONS, #'s 5,6, 7, 8, 9, 22, 28  2. When is this surgery scheduled? TBD  3. What type of clearance is required (medical clearance vs. Pharmacy clearance to hold med vs. Both)? MEDICAL  4. Are there any medications that need to be held prior to surgery and how long? ASA AND PLAVIX  5. Practice name and name of physician performing surgery? DR. Ferd Glassing, DMD, PA; [facility]  6. What is your office phone number [phone]   7.   What is your office fax number [phone]  8.   Anesthesia type (None, local, MAC, general) ? NONE LISTED   Julaine Hua 01/22/2019, 4:11 PM  _________________________________________________________________   (provider comments below)

## 2019-01-22 NOTE — Telephone Encounter (Signed)
PT HAS 2 MRN#'s. OPENED IN ERROR This encounter was created in error - please disregard.

## 2019-01-23 NOTE — Telephone Encounter (Signed)
   Primary Cardiologist: Sherren Mocha, MD  Chart reviewed as part of pre-operative protocol coverage. Simple dental extractions are considered low risk procedures per guidelines and generally do not require any specific cardiac clearance. It is also generally accepted that for simple extractions and dental cleanings, there is no need to interrupt blood thinner therapy. Given number of teeth being pulled, patient can hold plavix and aspirin 5 days prior to her dental procedure per Dr. Antionette Char recommendations.  SBE prophylaxis is not required for the patient.  I will route this recommendation to the requesting party via Epic fax function and remove from pre-op pool.  Please call with questions.  Abigail Butts, PA-C 01/23/2019, 1:11 PM

## 2019-01-23 NOTE — Telephone Encounter (Signed)
Okay to hold aspirin and Plavix for 5 days each before dental extraction.  Thank you

## 2019-01-30 DIAGNOSIS — Z7984 Long term (current) use of oral hypoglycemic drugs: Secondary | ICD-10-CM | POA: Diagnosis not present

## 2019-01-30 DIAGNOSIS — F329 Major depressive disorder, single episode, unspecified: Secondary | ICD-10-CM | POA: Diagnosis not present

## 2019-01-30 DIAGNOSIS — E785 Hyperlipidemia, unspecified: Secondary | ICD-10-CM | POA: Diagnosis not present

## 2019-01-30 DIAGNOSIS — F419 Anxiety disorder, unspecified: Secondary | ICD-10-CM | POA: Diagnosis not present

## 2019-01-30 DIAGNOSIS — M5412 Radiculopathy, cervical region: Secondary | ICD-10-CM | POA: Diagnosis not present

## 2019-01-30 DIAGNOSIS — E1165 Type 2 diabetes mellitus with hyperglycemia: Secondary | ICD-10-CM | POA: Diagnosis not present

## 2019-01-30 DIAGNOSIS — I2510 Atherosclerotic heart disease of native coronary artery without angina pectoris: Secondary | ICD-10-CM | POA: Diagnosis not present

## 2019-01-30 DIAGNOSIS — I10 Essential (primary) hypertension: Secondary | ICD-10-CM | POA: Diagnosis not present

## 2019-01-30 DIAGNOSIS — K589 Irritable bowel syndrome without diarrhea: Secondary | ICD-10-CM | POA: Diagnosis not present

## 2019-02-06 DIAGNOSIS — I10 Essential (primary) hypertension: Secondary | ICD-10-CM | POA: Diagnosis not present

## 2019-02-06 DIAGNOSIS — E1165 Type 2 diabetes mellitus with hyperglycemia: Secondary | ICD-10-CM | POA: Diagnosis not present

## 2019-02-06 DIAGNOSIS — G459 Transient cerebral ischemic attack, unspecified: Secondary | ICD-10-CM | POA: Diagnosis not present

## 2019-02-06 DIAGNOSIS — I2510 Atherosclerotic heart disease of native coronary artery without angina pectoris: Secondary | ICD-10-CM | POA: Diagnosis not present

## 2019-02-06 DIAGNOSIS — Z955 Presence of coronary angioplasty implant and graft: Secondary | ICD-10-CM | POA: Diagnosis not present

## 2019-02-06 DIAGNOSIS — F329 Major depressive disorder, single episode, unspecified: Secondary | ICD-10-CM | POA: Diagnosis not present

## 2019-02-06 DIAGNOSIS — E785 Hyperlipidemia, unspecified: Secondary | ICD-10-CM | POA: Diagnosis not present

## 2019-02-06 DIAGNOSIS — F419 Anxiety disorder, unspecified: Secondary | ICD-10-CM | POA: Diagnosis not present

## 2019-02-06 DIAGNOSIS — M5412 Radiculopathy, cervical region: Secondary | ICD-10-CM | POA: Diagnosis not present

## 2019-02-06 DIAGNOSIS — Z0000 Encounter for general adult medical examination without abnormal findings: Secondary | ICD-10-CM | POA: Diagnosis not present

## 2019-02-10 ENCOUNTER — Other Ambulatory Visit: Payer: Self-pay | Admitting: Internal Medicine

## 2019-02-10 DIAGNOSIS — K2100 Gastro-esophageal reflux disease with esophagitis, without bleeding: Secondary | ICD-10-CM

## 2019-04-04 DIAGNOSIS — Z9289 Personal history of other medical treatment: Secondary | ICD-10-CM

## 2019-04-04 HISTORY — DX: Personal history of other medical treatment: Z92.89

## 2019-04-22 ENCOUNTER — Encounter: Payer: Self-pay | Admitting: Internal Medicine

## 2019-04-27 DIAGNOSIS — E119 Type 2 diabetes mellitus without complications: Secondary | ICD-10-CM | POA: Diagnosis not present

## 2019-04-27 DIAGNOSIS — H40033 Anatomical narrow angle, bilateral: Secondary | ICD-10-CM | POA: Diagnosis not present

## 2019-04-27 LAB — HM DIABETES EYE EXAM

## 2019-04-30 ENCOUNTER — Other Ambulatory Visit: Payer: Self-pay

## 2019-04-30 ENCOUNTER — Other Ambulatory Visit (INDEPENDENT_AMBULATORY_CARE_PROVIDER_SITE_OTHER): Payer: PPO

## 2019-04-30 ENCOUNTER — Encounter: Payer: Self-pay | Admitting: Internal Medicine

## 2019-04-30 ENCOUNTER — Ambulatory Visit (INDEPENDENT_AMBULATORY_CARE_PROVIDER_SITE_OTHER): Payer: PPO | Admitting: Internal Medicine

## 2019-04-30 VITALS — BP 146/70 | HR 65 | Temp 98.2°F | Resp 16 | Ht 60.0 in | Wt 120.5 lb

## 2019-04-30 DIAGNOSIS — E118 Type 2 diabetes mellitus with unspecified complications: Secondary | ICD-10-CM | POA: Diagnosis not present

## 2019-04-30 DIAGNOSIS — I2510 Atherosclerotic heart disease of native coronary artery without angina pectoris: Secondary | ICD-10-CM | POA: Diagnosis not present

## 2019-04-30 DIAGNOSIS — E785 Hyperlipidemia, unspecified: Secondary | ICD-10-CM | POA: Diagnosis not present

## 2019-04-30 DIAGNOSIS — I10 Essential (primary) hypertension: Secondary | ICD-10-CM

## 2019-04-30 DIAGNOSIS — Z23 Encounter for immunization: Secondary | ICD-10-CM

## 2019-04-30 LAB — LIPID PANEL
Cholesterol: 94 mg/dL (ref 0–200)
HDL: 50.2 mg/dL (ref >39.00)
LDL Cholesterol: 26 mg/dL (ref 0–99)
NonHDL: 43.38
Total CHOL/HDL Ratio: 2
Triglycerides: 85 mg/dL (ref 0.0–149.0)
VLDL: 17 mg/dL (ref 0.0–40.0)

## 2019-04-30 LAB — BASIC METABOLIC PANEL
BUN: 18 mg/dL (ref 6–23)
CO2: 33 mEq/L — ABNORMAL HIGH (ref 19–32)
Calcium: 9.2 mg/dL (ref 8.4–10.5)
Chloride: 101 mEq/L (ref 96–112)
Creatinine, Ser: 0.83 mg/dL (ref 0.40–1.20)
GFR: 66.23 mL/min (ref >60.00)
Glucose, Bld: 117 mg/dL — ABNORMAL HIGH (ref 70–99)
Potassium: 4.3 mEq/L (ref 3.5–5.1)
Sodium: 140 mEq/L (ref 135–145)

## 2019-04-30 LAB — HEMOGLOBIN A1C: Hgb A1c MFr Bld: 6.5 % (ref 4.6–6.5)

## 2019-04-30 NOTE — Progress Notes (Signed)
Subjective:  Patient ID: [name] [name], female    DOB: [birth year]  Age: 78 y.o. MRN: [id number]  CC: Diabetes, Hypertension, and Coronary Artery Disease   HPI [name] [name] presents for f/up - She is active and denies any recent episodes of chest pain, shortness of breath, diaphoresis, edema, or palpitations.  Outpatient Medications Prior to Visit  Medication Sig Dispense Refill  . amLODipine (NORVASC) 5 MG tablet Take 1 tablet (5 mg total) by mouth daily. 90 tablet 1  . Ascorbic Acid (VITAMIN C) 1000 MG tablet Take 1,000 mg by mouth daily.      Marland Kitchen aspirin EC 81 MG tablet Take 81 mg by mouth every morning.     Marland Kitchen atorvastatin (LIPITOR) 80 MG tablet Take 80 mg by mouth every evening.     . Cholecalciferol (VITAMIN D3) 1000 UNITS CAPS Take 1 capsule by mouth daily.      . clopidogrel (PLAVIX) 75 MG tablet TAKE ONE TABLET BY MOUTH ONCE DAILY 30 tablet 10  . DEXILANT 60 MG capsule Take 1 capsule by mouth once daily 90 capsule 1  . ezetimibe (ZETIA) 10 MG tablet Take by mouth.    . gabapentin (NEURONTIN) 300 MG capsule TAKE ONE CAPSULE BY MOUTH TWICE DAILY 180 capsule 1  . metFORMIN (GLUCOPHAGE) 500 MG tablet Take by mouth 2 (two) times daily with a meal.    . metoprolol succinate (TOPROL-XL) 25 MG 24 hr tablet Take 1 tablet (25 mg total) by mouth daily. 90 tablet 1  . olmesartan (BENICAR) 40 MG tablet Take 1 tablet (40 mg total) by mouth daily. 90 tablet 1  . Omega-3 Fatty Acids (FISH OIL) 1000 MG CAPS Take 2 capsules by mouth 4 (four) times daily.     Marland Kitchen venlafaxine XR (EFFEXOR-XR) 37.5 MG 24 hr capsule TAKE ONE CAPSULE BY MOUTH ONCE DAILY WITH BREAKFAST 30 capsule 11   No facility-administered medications prior to visit.     ROS Review of Systems  Constitutional: Negative for diaphoresis, fatigue and unexpected weight change.  HENT: Negative.   Eyes: Negative for visual disturbance.  Respiratory: Negative for cough, chest tightness, shortness of breath and wheezing.    Cardiovascular: Negative for chest pain, palpitations and leg swelling.  Gastrointestinal: Negative for abdominal pain, constipation and diarrhea.  Endocrine: Negative.   Genitourinary: Negative.  Negative for difficulty urinating.  Musculoskeletal: Negative.  Negative for arthralgias, back pain, myalgias and neck pain.  Skin: Negative.  Negative for color change and pallor.  Neurological: Negative.  Negative for dizziness, weakness and light-headedness.  Hematological: Negative for adenopathy. Does not bruise/bleed easily.  Psychiatric/Behavioral: Negative.     Objective:  BP (!) 146/70 (BP Location: Left Arm, Patient Position: Sitting, Cuff Size: Normal)   Pulse 65   Temp 98.2 F (36.8 C) (Oral)   Resp 16   Ht 5' (1.524 m)   Wt 120 lb 8 oz (54.7 kg)   SpO2 95%   BMI 23.53 kg/m   BP Readings from Last 3 Encounters:  04/30/19 (!) 146/70  10/28/18 140/60  10/20/18 (!) 180/82    Wt Readings from Last 3 Encounters:  04/30/19 120 lb 8 oz (54.7 kg)  10/28/18 124 lb 6.4 oz (56.4 kg)  10/20/18 121 lb (54.9 kg)    Physical Exam Vitals signs reviewed.  Constitutional:      Appearance: Normal appearance. She is normal weight.  HENT:     Nose: Nose normal.     Mouth/Throat:     Mouth: Mucous  membranes are moist.  Eyes:     General: No scleral icterus.    Conjunctiva/sclera: Conjunctivae normal.  Neck:     Musculoskeletal: Normal range of motion. No neck rigidity or muscular tenderness.  Cardiovascular:     Rate and Rhythm: Normal rate and regular rhythm.     Heart sounds: Murmur present. Systolic murmur present with a grade of 1/6. No diastolic murmur. No gallop.   Pulmonary:     Effort: Pulmonary effort is normal. No respiratory distress.     Breath sounds: No stridor. No wheezing, rhonchi or rales.  Abdominal:     General: Abdomen is protuberant. Bowel sounds are normal.     Palpations: There is no hepatomegaly or splenomegaly.  Musculoskeletal:     Right lower leg:  No edema.     Left lower leg: No edema.  Lymphadenopathy:     Cervical: No cervical adenopathy.  Neurological:     General: No focal deficit present.     Mental Status: She is alert and oriented to person, place, and time. Mental status is at baseline.  Psychiatric:        Mood and Affect: Mood normal.        Behavior: Behavior normal.     Lab Results  Component Value Date   WBC 8.9 10/28/2018   HGB 12.9 10/28/2018   HCT 40.3 10/28/2018   PLT 349 10/28/2018   GLUCOSE 117 (H) 04/30/2019   CHOL 94 04/30/2019   TRIG 85.0 04/30/2019   HDL 50.20 04/30/2019   LDLDIRECT 68.0 03/10/2015   LDLCALC 26 04/30/2019   ALT 22 07/15/2018   AST 20 07/15/2018   NA 140 04/30/2019   K 4.3 04/30/2019   CL 101 04/30/2019   CREATININE 0.83 04/30/2019   BUN 18 04/30/2019   CO2 33 (H) 04/30/2019   TSH 2.43 07/15/2018   INR 1.09 01/23/2018   HGBA1C 6.5 04/30/2019   MICROALBUR 2.6 (H) 07/15/2018    Ct Femur Left Wo Contrast  Result Date: 01/29/2018 CLINICAL DATA:  Acute onset of left thigh swelling. EXAM: CT OF THE LOWER LEFT EXTREMITY WITHOUT CONTRAST TECHNIQUE: Multidetector CT imaging of the lower left extremity was performed according to the standard protocol. COMPARISON:  CT of the left femur performed 01/23/2018 FINDINGS: Bones/Joint/Cartilage There is no evidence of fracture or dislocation. No osseous erosions are seen. The left femoral head remains seated at the acetabulum. Mild subcortical cystic change is noted at the superior left acetabulum. 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 There is no evidence of loosening. The inferior aspect of the prosthesis is incompletely imaged. A small knee joint effusion is suspected. Ligaments Suboptimally assessed by CT. Muscles and Tendons Vague soft tissue edema is noted tracking about the proximal quadriceps musculature. Vague intramuscular hematoma is suggested at the lateral aspect of the quadriceps musculature. Underlying  myositis cannot be excluded. No abscess is seen. Would correlate for recent traumatic injury. Soft tissues Diffuse mild soft tissue edema is noted tracking along the left thigh, with posterior sparing. The visualized portions of the pelvis are unremarkable, aside from scattered diverticulosis along the sigmoid colon. Postoperative and posttraumatic change is noted about the pubic symphysis. IMPRESSION: 1. No evidence of fracture or dislocation. 2. Vague intramuscular hematoma suggested at the lateral aspect of the quadriceps musculature. Underlying myositis cannot be excluded. Would correlate for recent traumatic injury. No evidence of abscess at this time. 3. Diffuse mild soft tissue edema tracking along the left thigh, with posterior sparing.  4. Small knee joint effusion suspected. 5. Scattered diverticulosis along the sigmoid colon. Electronically Signed   By: Garald Balding M.D.   On: 01/29/2018 22:33    Assessment & Plan:   Wilfred was seen today for diabetes, hypertension and coronary artery disease.  Diagnoses and all orders for this visit:  Controlled type 2 diabetes mellitus with complication, without long-term current use of insulin ([place])- Her A1c is at 6.5%.  Her blood sugars are adequately well controlled.  Will continue the current dose of metformin. -     Hemoglobin A1c; Future  Essential hypertension- Her blood pressure is adequately well controlled. -     Basic metabolic panel; Future  Coronary artery disease involving native coronary artery of native heart without angina pectoris- She denies any recent episodes of angina.  Will continue to work on her risk factor modifications. -     Lipid panel; Future  Need for influenza vaccination -     Flu Vaccine QUAD High Dose(Fluad)  Hyperlipidemia with target LDL less than 100- She has achieved her LDL goal and is doing well on the statin. -     Lipid panel; Future   I am having Illona F. Nauert "FAYE" maintain her Fish Oil, Vitamin  D3, vitamin C, aspirin EC, clopidogrel, gabapentin, venlafaxine XR, atorvastatin, ezetimibe, metFORMIN, amLODipine, metoprolol succinate, olmesartan, and Dexilant.  No orders of the defined types were placed in this encounter.    Follow-up: Return in about 6 months (around 10/31/2019).  [name], MD

## 2019-04-30 NOTE — Patient Instructions (Signed)
Type 2 Diabetes Mellitus, Diagnosis, Adult Type 2 diabetes (type 2 diabetes mellitus) is a long-term (chronic) disease. In type 2 diabetes, one or both of these problems may be present:  The pancreas does not make enough of a hormone called insulin.  Cells in the body do not respond properly to insulin that the body makes (insulin resistance). Normally, insulin allows blood sugar (glucose) to enter cells in the body. The cells use glucose for energy. Insulin resistance or lack of insulin causes excess glucose to build up in the blood instead of going into cells. As a result, high blood glucose (hyperglycemia) develops. What increases the risk? The following factors may make you more likely to develop type 2 diabetes:  Having a family member with type 2 diabetes.  Being overweight or obese.  Having an inactive (sedentary) lifestyle.  Having been diagnosed with insulin resistance.  Having a history of prediabetes, gestational diabetes, or polycystic ovary syndrome (PCOS).  Being of American-Indian, African-American, Hispanic/Latino, or Asian/Pacific Islander descent. What are the signs or symptoms? In the early stage of this condition, you may not have symptoms. Symptoms develop slowly and may include:  Increased thirst (polydipsia).  Increased hunger(polyphagia).  Increased urination (polyuria).  Increased urination during the night (nocturia).  Unexplained weight loss.  Frequent infections that keep coming back (recurring).  Fatigue.  Weakness.  Vision changes, such as blurry vision.  Cuts or bruises that are slow to heal.  Tingling or numbness in the hands or feet.  Dark patches on the skin (acanthosis nigricans). How is this diagnosed? This condition is diagnosed based on your symptoms, your medical history, a physical exam, and your blood glucose level. Your blood glucose may be checked with one or more of the following blood tests:  A fasting blood glucose (FBG)  test. You will not be allowed to eat (you will fast) for 8 hours or longer before a blood sample is taken.  A random blood glucose test. This test checks blood glucose at any time of day regardless of when you ate.  An A1c (hemoglobin A1c) blood test. This test provides information about blood glucose control over the previous 2-3 months.  An oral glucose tolerance test (OGTT). This test measures your blood glucose at two times: ? After fasting. This is your baseline blood glucose level. ? Two hours after drinking a beverage that contains glucose. You may be diagnosed with type 2 diabetes if:  Your FBG level is 126 mg/dL (7.0 mmol/L) or higher.  Your random blood glucose level is 200 mg/dL (11.1 mmol/L) or higher.  Your A1c level is 6.5% or higher.  Your OGTT result is higher than 200 mg/dL (11.1 mmol/L). These blood tests may be repeated to confirm your diagnosis. How is this treated? Your treatment may be managed by a specialist called an endocrinologist. Type 2 diabetes may be treated by following instructions from your health care provider about:  Making diet and lifestyle changes. This may include: ? Following an individualized nutrition plan that is developed by a diet and nutrition specialist (registered dietitian). ? Exercising regularly. ? Finding ways to manage stress.  Checking your blood glucose level as often as told.  Taking diabetes medicines or insulin daily. This helps to keep your blood glucose levels in the healthy range. ? If you use insulin, you may need to adjust the dosage depending on how physically active you are and what foods you eat. Your health care provider will tell you how to adjust your dosage.    Taking medicines to help prevent complications from diabetes, such as: ? Aspirin. ? Medicine to lower cholesterol. ? Medicine to control blood pressure. Your health care provider will set individualized treatment goals for you. Your goals will be based on  your age, other medical conditions you have, and how you respond to diabetes treatment. Generally, the goal of treatment is to maintain the following blood glucose levels:  Before meals (preprandial): 80-130 mg/dL (4.4-7.2 mmol/L).  After meals (postprandial): below 180 mg/dL (10 mmol/L).  A1c level: less than 7%. Follow these instructions at home: Questions to ask your health care provider  Consider asking the following questions: ? Do I need to meet with a diabetes educator? ? Where can I find a support group for people with diabetes? ? What equipment will I need to manage my diabetes at home? ? What diabetes medicines do I need, and when should I take them? ? How often do I need to check my blood glucose? ? What number can I call if I have questions? ? When is my next appointment? General instructions  Take over-the-counter and prescription medicines only as told by your health care provider.  Keep all follow-up visits as told by your health care provider. This is important.  For more information about diabetes, visit: ? American Diabetes Association (ADA): www.diabetes.org ? American Association of Diabetes Educators (AADE): www.diabeteseducator.org Contact a health care provider if:  Your blood glucose is at or above 240 mg/dL (13.3 mmol/L) for 2 days in a row.  You have been sick or have had a fever for 2 days or longer, and you are not getting better.  You have any of the following problems for more than 6 hours: ? You cannot eat or drink. ? You have nausea and vomiting. ? You have diarrhea. Get help right away if:  Your blood glucose is lower than 54 mg/dL (3.0 mmol/L).  You become confused or you have trouble thinking clearly.  You have difficulty breathing.  You have moderate or large ketone levels in your urine. Summary  Type 2 diabetes (type 2 diabetes mellitus) is a long-term (chronic) disease. In type 2 diabetes, the pancreas does not make enough of a  hormone called insulin, or cells in the body do not respond properly to insulin that the body makes (insulin resistance).  This condition is treated by making diet and lifestyle changes and taking diabetes medicines or insulin.  Your health care provider will set individualized treatment goals for you. Your goals will be based on your age, other medical conditions you have, and how you respond to diabetes treatment.  Keep all follow-up visits as told by your health care provider. This is important. This information is not intended to replace advice given to you by your health care provider. Make sure you discuss any questions you have with your health care provider. Document Released: 08/20/2005 Document Revised: 10/18/2017 Document Reviewed: 09/23/2015 Elsevier Patient Education  2020 Elsevier Inc.  

## 2019-05-01 ENCOUNTER — Encounter: Payer: Self-pay | Admitting: Physician Assistant

## 2019-05-01 ENCOUNTER — Telehealth: Payer: Self-pay

## 2019-05-01 ENCOUNTER — Ambulatory Visit (HOSPITAL_COMMUNITY): Payer: PPO | Attending: Cardiology

## 2019-05-01 DIAGNOSIS — I2510 Atherosclerotic heart disease of native coronary artery without angina pectoris: Secondary | ICD-10-CM | POA: Diagnosis not present

## 2019-05-01 DIAGNOSIS — R0602 Shortness of breath: Secondary | ICD-10-CM | POA: Diagnosis not present

## 2019-05-01 NOTE — Telephone Encounter (Signed)
-----   Message from Liliane Shi, [place] sent at 05/01/2019 12:45 PM EDT ----- Please call the patient. The echocardiogram shows normal heart function.  There is moderate stiffness during relaxation.  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  There is no evidence to suggest an infection. PLAN:   - Continue current medications and follow up as planned.   - Send copy to PCP Richardson Dopp, PA-C    05/01/2019 12:34 PM

## 2019-05-01 NOTE — Telephone Encounter (Signed)
Notes recorded by Frederik Schmidt, RN on 05/01/2019 at 12:51 PM EDT  The patient has been notified of the result and verbalized understanding. All questions (if any) were answered.  Frederik Schmidt, RN 05/01/2019 12:51 PM

## 2019-05-20 ENCOUNTER — Ambulatory Visit: Payer: PPO | Admitting: Internal Medicine

## 2019-05-20 ENCOUNTER — Encounter: Payer: Self-pay | Admitting: Internal Medicine

## 2019-05-20 ENCOUNTER — Other Ambulatory Visit: Payer: Self-pay

## 2019-05-20 VITALS — BP 150/72 | HR 72 | Temp 98.2°F | Ht 60.0 in | Wt 119.0 lb

## 2019-05-20 DIAGNOSIS — K648 Other hemorrhoids: Secondary | ICD-10-CM

## 2019-05-20 DIAGNOSIS — Z86010 Personal history of colonic polyps: Secondary | ICD-10-CM | POA: Diagnosis not present

## 2019-05-20 NOTE — Patient Instructions (Addendum)
You will be due for a recall colonoscopy in 06/2021. We will send you a reminder in the mail when it gets closer to that time.  Think about hemorrhoidal banding and let us know if you decide to go forward with it. If you do, we will need to get clearance for you to hold your blood thinner first.  Please purchase the following medications over the counter and take as directed: Hydrocortisone Suppository-1 per rectum every night x 5 nights. You may do this periodically if it helps but should NOT be doing this long term.  If you are age 31 or older, your body mass index should be between 23-30. Your Body mass index is 23.24 kg/m. If this is out of the aforementioned range listed, please consider follow up with your Primary Care Provider.  If you are age 38 or younger, your body mass index should be between 19-25. Your Body mass index is 23.24 kg/m. If this is out of the aformentioned range listed, please consider follow up with your Primary Care Provider.

## 2019-05-20 NOTE — Progress Notes (Signed)
Subjective:    Patient ID: [name] [name], female    DOB: 1940/09/20, 78 y.o.   MRN: [id number]  HPI [name] [name] is a 78 year old female with a past medical history of adenomatous colon polyps, internal hemorrhoids, history of ischemic colitis, CAD on Plavix, hypertension, hyperlipidemia, diabetes, CKD who is here for follow-up.  She is here today with her grand daughter.  She was last seen at the time of surveillance colonoscopy also performed for lower abdominal pain and loose stools on 06/28/2016.  This colonoscopy to the terminal ileum revealed a 3 mm descending colon adenoma.  Multiple left-sided diverticula.  Prolapsed internal hemorrhoids.  Remaining mucosa was normal in the examined terminal ileum and colon.  Random biopsies were normal.  She reports that she has been doing well.  She wonders if she is due for a surveillance colonoscopy due to her history of polyps.  She does have irritable bowel which for her is lower abdominal crampy discomfort which seems to come and go.  This is better since she is added fiber capsules and taking dicyclomine 3 times daily.  Prior to fiber and dicyclomine the pain can become intense and lead to vomiting.  Her bowel movements have been normal.  She denies diarrhea and constipation.  She does have fairly consistent internal hemorrhoid symptoms including prolapse and occasional blood with wiping.  She feels a downward pressure after bowel movement.  This symptom has been stable.  She denies upper GI and hepatobiliary complaint today.  She is not using anything OTC for hemorrhoids   Review of Systems As per HPI, otherwise negative  Current Medications, Allergies, Past Medical History, Past Surgical History, Family History and Social History were reviewed in [employer] record.     Objective:   Physical Exam BP (!) 150/72   Pulse 72   Temp 98.2 F (36.8 C) (Oral)   Ht 5' (1.524 m)   Wt 119 lb (54 kg)   BMI 23.24 kg/m  Gen:  awake, alert, NAD HEENT: anicteric, op clear CV: RRR, no mrg Pulm: CTA b/l Abd: soft, NT/ND, +BS throughout Ext: no c/c/e Neuro: nonfocal  CBC    Component Value Date/Time   WBC 8.9 10/28/2018 1255   WBC 7.6 07/15/2018 1024   RBC 4.08 10/28/2018 1255   RBC 4.34 07/15/2018 1024   HGB 12.9 10/28/2018 1255   HCT 40.3 10/28/2018 1255   PLT 349 10/28/2018 1255   MCV 99 (H) 10/28/2018 1255   MCH 31.6 10/28/2018 1255   MCH 31.7 01/29/2018 2138   MCHC 32.0 10/28/2018 1255   MCHC 33.4 07/15/2018 1024   RDW 12.5 10/28/2018 1255   LYMPHSABS 2.2 07/15/2018 1024   MONOABS 0.6 07/15/2018 1024   EOSABS 0.2 07/15/2018 1024   BASOSABS 0.1 07/15/2018 1024   CMP     Component Value Date/Time   NA 140 04/30/2019 1134   NA 141 10/28/2018 1255   K 4.3 04/30/2019 1134   CL 101 04/30/2019 1134   CO2 33 (H) 04/30/2019 1134   GLUCOSE 117 (H) 04/30/2019 1134   GLUCOSE 126 (H) 06/24/2006 0738   BUN 18 04/30/2019 1134   BUN 15 10/28/2018 1255   CREATININE 0.83 04/30/2019 1134   CALCIUM 9.2 04/30/2019 1134   PROT 7.0 07/15/2018 1024   ALBUMIN 4.3 07/15/2018 1024   AST 20 07/15/2018 1024   ALT 22 07/15/2018 1024   ALKPHOS 50 07/15/2018 1024   BILITOT 0.5 07/15/2018 1024   GFRNONAA 69 10/28/2018 1255  GFRAA 80 10/28/2018 1255        Assessment & Plan:   78 year old female with a past medical history of adenomatous colon polyps, internal hemorrhoids, history of ischemic colitis, CAD on Plavix, hypertension, hyperlipidemia, diabetes, CKD who is here for follow-up.   1.  Symptomatic internal hemorrhoids --we spent time today discussing internal hemorrhoid therapy including hemorrhoidal banding.  Her Plavix use would increase the risk of post banding bleeding but would not preclude this treatment.  She prefers to discuss this further with her daughter and will let me know if she would like to proceed with hemorrhoidal banding.  If so we would likely do this on Plavix.  In the interim she can  use over-the-counter Preparation H suppositories with hydrocortisone nightly x5 nights.  This can be used intermittently if needed for symptomatic internal hemorrhoids.  2.  Personal history of adenomatous colon polyps --I let her know in reviewing her past colonoscopy that she is not due for surveillance at this time.  Her surveillance would be due in October 2022.  We will discuss the appropriateness of this test at that time that she will be 78 years old.  25 minutes spent with the patient today. Greater than 50% was spent in counseling and coordination of care with the patient

## 2019-05-27 ENCOUNTER — Other Ambulatory Visit: Payer: Self-pay | Admitting: Internal Medicine

## 2019-05-27 DIAGNOSIS — E118 Type 2 diabetes mellitus with unspecified complications: Secondary | ICD-10-CM

## 2019-05-27 DIAGNOSIS — I2510 Atherosclerotic heart disease of native coronary artery without angina pectoris: Secondary | ICD-10-CM

## 2019-05-27 DIAGNOSIS — I10 Essential (primary) hypertension: Secondary | ICD-10-CM

## 2019-05-27 MED ORDER — CLOPIDOGREL BISULFATE 75 MG PO TABS: 75.0000 mg | ORAL_TABLET | Freq: Every day | ORAL | 1 refills | Status: DC

## 2019-05-27 MED ORDER — OLMESARTAN MEDOXOMIL 40 MG PO TABS: 40.0000 mg | ORAL_TABLET | Freq: Every day | ORAL | 1 refills | Status: DC

## 2019-05-27 MED ORDER — GABAPENTIN 300 MG PO CAPS: 300.0000 mg | ORAL_CAPSULE | Freq: Two times a day (BID) | ORAL | 1 refills | Status: DC

## 2019-05-27 MED ORDER — VENLAFAXINE HCL ER 37.5 MG PO CP24: ORAL_CAPSULE | ORAL | 1 refills | Status: DC

## 2019-05-27 MED ORDER — ATORVASTATIN CALCIUM 80 MG PO TABS: 80.0000 mg | ORAL_TABLET | Freq: Every evening | ORAL | 1 refills | Status: DC

## 2019-05-27 MED ORDER — EZETIMIBE 10 MG PO TABS: 10.0000 mg | ORAL_TABLET | Freq: Every day | ORAL | 1 refills | Status: DC

## 2019-05-27 NOTE — Telephone Encounter (Signed)
Pt needs refill on gabapentin she takes 2 in morning and 2 in evenings, atorvastatin, , ezetimibe 10 mg, metformin , dicyclomine 10 mg pt take 3x a day, olmesartan 40 mg, venlafaxine er 37.5 mg, and clopidogrel sent to [facility] [place]. Pt needs 90 day supply

## 2019-05-27 NOTE — Telephone Encounter (Signed)
Requested medication (s) are due for refill today: yes  Requested medication (s) are on the active medication list: yes   Future visit scheduled: yes  Notes to clinic:   Pt needs refill on gabapentin she takes 2 in morning and 2 in evenings, atorvastatin, , ezetimibe 10 mg, metformin , dicyclomine 10 mg pt take 3x a day, olmesartan 40 mg, venlafaxine er 37.5 mg, and clopidogrel sent to [facility] [place]. Pt needs 90 day supply      Documentation       Requested Prescriptions  Pending Prescriptions Disp Refills   gabapentin (NEURONTIN) 300 MG capsule 180 capsule 1    Sig: Take 1 capsule (300 mg total) by mouth 2 (two) times daily.     Neurology: Anticonvulsants - gabapentin Passed - 05/27/2019 10:15 AM      Passed - Valid encounter within last 12 months    Recent Outpatient Visits          3 weeks ago Controlled type 2 diabetes mellitus with complication, without long-term current use of insulin ([place])   [place], Thomas L, MD   5 months ago Controlled type 2 diabetes mellitus with complication, without long-term current use of insulin ([place])   [place], Thomas L, MD   7 months ago Essential hypertension   [place], Thomas L, MD   8 months ago Acute sinusitis, recurrence not specified, unspecified location   [place] John, James W, MD   10 months ago Deficiency anemia   [place], MD      Future Appointments            In 5 months Janith Lima, MD [place], [facility]            atorvastatin (LIPITOR) 80 MG tablet       Sig: Take 1 tablet (80 mg total) by mouth every evening.     Cardiovascular:  Antilipid - Statins Passed - 05/27/2019 10:15 AM      Passed - Total Cholesterol in normal range and within 360 days    Cholesterol  Date Value Ref Range Status   04/30/2019 94 0 - 200 mg/dL Final    Comment:    ATP III Classification       Desirable:  < 200 mg/dL               Borderline High:  200 - 239 mg/dL          High:  > = 240 mg/dL         Passed - LDL in normal range and within 360 days    LDL Cholesterol  Date Value Ref Range Status  04/30/2019 26 0 - 99 mg/dL Final         Passed - HDL in normal range and within 360 days    HDL  Date Value Ref Range Status  04/30/2019 50.20 >39.00 mg/dL Final         Passed - Triglycerides in normal range and within 360 days    Triglycerides  Date Value Ref Range Status  04/30/2019 85.0 0.0 - 149.0 mg/dL Final    Comment:    Normal:  <150 mg/dLBorderline High:  150 - 199 mg/dL   Triglyceride fasting, serum  Date Value Ref Range Status  06/24/2006 164 (H) 0 - 149 mg/dL Final  Comment:    See lab report for associated comment(s)         Passed - Patient is not pregnant      Passed - Valid encounter within last 12 months    Recent Outpatient Visits          3 weeks ago Controlled type 2 diabetes mellitus with complication, without long-term current use of insulin ([place])   [place], Thomas L, MD   5 months ago Controlled type 2 diabetes mellitus with complication, without long-term current use of insulin ([place])   [place], Thomas L, MD   7 months ago Essential hypertension   [place], MD   8 months ago Acute sinusitis, recurrence not specified, unspecified location   [place] John, James W, MD   10 months ago Deficiency anemia   [place], MD      Future Appointments            In 5 months Ronnald Ramp Arvid Right, MD [place], [facility]            ezetimibe (ZETIA) 10 MG tablet       Sig: Take by mouth.     Cardiovascular:  Antilipid - Sterol Transport Inhibitors Passed -  05/27/2019 10:15 AM      Passed - Total Cholesterol in normal range and within 360 days    Cholesterol  Date Value Ref Range Status  04/30/2019 94 0 - 200 mg/dL Final    Comment:    ATP III Classification       Desirable:  < 200 mg/dL               Borderline High:  200 - 239 mg/dL          High:  > = 240 mg/dL         Passed - LDL in normal range and within 360 days    LDL Cholesterol  Date Value Ref Range Status  04/30/2019 26 0 - 99 mg/dL Final         Passed - HDL in normal range and within 360 days    HDL  Date Value Ref Range Status  04/30/2019 50.20 >39.00 mg/dL Final         Passed - Triglycerides in normal range and within 360 days    Triglycerides  Date Value Ref Range Status  04/30/2019 85.0 0.0 - 149.0 mg/dL Final    Comment:    Normal:  <150 mg/dLBorderline High:  150 - 199 mg/dL   Triglyceride fasting, serum  Date Value Ref Range Status  06/24/2006 164 (H) 0 - 149 mg/dL Final    Comment:    See lab report for associated comment(s)         Passed - Valid encounter within last 12 months    Recent Outpatient Visits          3 weeks ago Controlled type 2 diabetes mellitus with complication, without long-term current use of insulin ([place])   [facility] -Mayer Camel, MD   5 months ago Controlled type 2 diabetes mellitus with complication, without long-term current use of insulin ([place])   [facility] -[facility] Camel, MD   7 months ago Essential hypertension   [facility] -Mayer Camel, MD  8 months ago Acute sinusitis, recurrence not specified, unspecified location   [place] John, James W, MD   10 months ago Deficiency anemia   [place], MD      Future Appointments            In 5 months Janith Lima, MD [place], [facility]            clopidogrel (PLAVIX) 75 MG tablet 30  tablet 10    Sig: Take 1 tablet (75 mg total) by mouth daily.     Hematology: Antiplatelets - clopidogrel Failed - 05/27/2019 10:15 AM      Failed - Evaluate AST, ALT within 2 months of therapy initiation.      Failed - HCT in normal range and within 180 days    Hematocrit  Date Value Ref Range Status  10/28/2018 40.3 34.0 - 46.6 % Final         Failed - HGB in normal range and within 180 days    Hemoglobin  Date Value Ref Range Status  10/28/2018 12.9 11.1 - 15.9 g/dL Final         Failed - PLT in normal range and within 180 days    Platelets  Date Value Ref Range Status  10/28/2018 349 150 - 450 x10E3/uL Final         Passed - ALT in normal range and within 360 days    ALT  Date Value Ref Range Status  07/15/2018 22 0 - 35 U/L Final         Passed - AST in normal range and within 360 days    AST  Date Value Ref Range Status  07/15/2018 20 0 - 37 U/L Final         Passed - Valid encounter within last 6 months    Recent Outpatient Visits          3 weeks ago Controlled type 2 diabetes mellitus with complication, without long-term current use of insulin ([place])   [place], MD   5 months ago Controlled type 2 diabetes mellitus with complication, without long-term current use of insulin ([place])   [place], Thomas L, MD   7 months ago Essential hypertension   [place], Thomas L, MD   8 months ago Acute sinusitis, recurrence not specified, unspecified location   [place] John, James W, MD   10 months ago Deficiency anemia   [place], MD      Future Appointments            In 5 months Janith Lima, MD [facility]            venlafaxine XR (EFFEXOR-XR) 37.5 MG 24 hr capsule 30 capsule 11    Sig: TAKE ONE CAPSULE BY MOUTH ONCE DAILY WITH BREAKFAST      Psychiatry: Antidepressants - SNRI - desvenlafaxine & venlafaxine Failed - 05/27/2019 10:15 AM      Failed - Last BP in normal range    BP Readings from Last 1 Encounters:  05/20/19 (!) 150/72         Passed - LDL in normal range and within 360 days    LDL Cholesterol  Date Value Ref Range Status  04/30/2019 26 0 -  99 mg/dL Final         Passed - Total Cholesterol in normal range and within 360 days    Cholesterol  Date Value Ref Range Status  04/30/2019 94 0 - 200 mg/dL Final    Comment:    ATP III Classification       Desirable:  < 200 mg/dL               Borderline High:  200 - 239 mg/dL          High:  > = 240 mg/dL         Passed - Triglycerides in normal range and within 360 days    Triglycerides  Date Value Ref Range Status  04/30/2019 85.0 0.0 - 149.0 mg/dL Final    Comment:    Normal:  <150 mg/dLBorderline High:  150 - 199 mg/dL   Triglyceride fasting, serum  Date Value Ref Range Status  06/24/2006 164 (H) 0 - 149 mg/dL Final    Comment:    See lab report for associated comment(s)         Passed - Valid encounter within last 6 months    Recent Outpatient Visits          3 weeks ago Controlled type 2 diabetes mellitus with complication, without long-term current use of insulin ([place])   [place], MD   5 months ago Controlled type 2 diabetes mellitus with complication, without long-term current use of insulin ([place])   [facility] -Mayer Camel, MD   7 months ago Essential hypertension   [facility] -Mayer Camel, MD   8 months ago Acute sinusitis, recurrence not specified, unspecified location   [place] John, James W, MD   10 months ago Deficiency anemia   [place], MD      Future Appointments            In 5 months Ronnald Ramp Arvid Right, MD [place], [place] - Completed PHQ-2 or PHQ-9 in the last 360 days.       olmesartan (BENICAR) 40 MG tablet 90 tablet 1    Sig: Take 1 tablet (40 mg total) by mouth daily.     Cardiovascular:  Angiotensin Receptor Blockers Failed - 05/27/2019 10:15 AM      Failed - Last BP in normal range    BP Readings from Last 1 Encounters:  05/20/19 (!) 150/72         Passed - Cr in normal range and within 180 days    Creatinine, Ser  Date Value Ref Range Status  04/30/2019 0.83 0.40 - 1.20 mg/dL Final         Passed - K in normal range and within 180 days    Potassium  Date Value Ref Range Status  04/30/2019 4.3 3.5 - 5.1 mEq/L Final         Passed - Patient is not pregnant      Passed - Valid encounter within last 6 months    Recent Outpatient Visits          3 weeks ago Controlled type 2 diabetes mellitus with complication, without long-term current use of insulin ([place])   [facility] -Mayer Camel, MD   5 months ago Controlled type  2 diabetes mellitus with complication, without long-term current use of insulin ([place])   [place], Thomas L, MD   7 months ago Essential hypertension   [place], MD   8 months ago Acute sinusitis, recurrence not specified, unspecified location   [place], MD   10 months ago Deficiency anemia   [place], MD      Future Appointments            In 5 months Ronnald Ramp Arvid Right, MD [place], [facility]

## 2019-07-16 ENCOUNTER — Telehealth: Payer: Self-pay | Admitting: Internal Medicine

## 2019-07-16 ENCOUNTER — Other Ambulatory Visit: Payer: Self-pay | Admitting: Internal Medicine

## 2019-07-16 DIAGNOSIS — I10 Essential (primary) hypertension: Secondary | ICD-10-CM

## 2019-07-16 DIAGNOSIS — M542 Cervicalgia: Secondary | ICD-10-CM

## 2019-07-16 DIAGNOSIS — I2510 Atherosclerotic heart disease of native coronary artery without angina pectoris: Secondary | ICD-10-CM

## 2019-07-16 DIAGNOSIS — K2100 Gastro-esophageal reflux disease with esophagitis, without bleeding: Secondary | ICD-10-CM

## 2019-07-16 MED ORDER — GABAPENTIN 300 MG PO CAPS: 300.0000 mg | ORAL_CAPSULE | Freq: Two times a day (BID) | ORAL | 1 refills | Status: DC

## 2019-07-16 MED ORDER — DICYCLOMINE HCL 10 MG PO CAPS: 10.0000 mg | ORAL_CAPSULE | Freq: Three times a day (TID) | ORAL | 1 refills | Status: DC

## 2019-07-16 MED ORDER — METOPROLOL SUCCINATE ER 25 MG PO TB24: 25.0000 mg | ORAL_TABLET | Freq: Every day | ORAL | 1 refills | Status: DC

## 2019-07-16 MED ORDER — AMLODIPINE BESYLATE 5 MG PO TABS: 5.0000 mg | ORAL_TABLET | Freq: Every day | ORAL | 1 refills | Status: DC

## 2019-07-16 NOTE — Telephone Encounter (Signed)
lvm informing pt rx has been sent in as requested.

## 2019-07-16 NOTE — Telephone Encounter (Signed)
Patients daughter called in to speak with Dr Jenny Reichmann about patients medication. Patient is relocating back to [place] and the following medication needs to be refilled metFORMIN (GLUCOPHAGE) 500 MG tablet [id number]     Sent to this [place], [place] - [id number]  [id number] [place] [address]  Phone: [phone] Fax: [phone]      Please advise

## 2019-07-16 NOTE — Telephone Encounter (Signed)
Done  TJ

## 2019-07-16 NOTE — Telephone Encounter (Signed)
Pt is requesting refills of amlodipine, dicyclomine, gabapentin and metoprolol to [facility] in [place], [place].    Last office visit was 04/30/2019.   Please advise

## 2019-07-16 NOTE — Telephone Encounter (Signed)
Please reach out to patient regarding these medications         Medication Refill - Medication: gabapentin (NEURONTIN) 300 MG capsule [id number]  (Patient has the wrong dosage for this medication its saying to take 2 capsules twice a day however the dosage should be 1 capsule once a day )   metoprolol succinate (TOPROL-XL) 25 MG 24 hr tablet [id number]  Patient was given 25mg  ,shes wanting to make sure its not supposed to be 100mg     amLODipine (NORVASC) 5 MG tablet [id number]   dicyclomine (BENTYL) 10 MG capsule [id number] DISCONTINUED          Preferred Pharmacy (with phone number or street name):  [id number] - [place], [place] - [id number]  [id number] [place] [address]  Phone: [phone] Fax: [phone]     Agent: Please be advised that RX refills may take up to 3 business days. We ask that you follow-up with your pharmacy.

## 2019-07-20 ENCOUNTER — Telehealth: Payer: Self-pay | Admitting: *Deleted

## 2019-07-20 ENCOUNTER — Other Ambulatory Visit: Payer: Self-pay | Admitting: Internal Medicine

## 2019-07-20 DIAGNOSIS — E118 Type 2 diabetes mellitus with unspecified complications: Secondary | ICD-10-CM

## 2019-07-20 DIAGNOSIS — M542 Cervicalgia: Secondary | ICD-10-CM

## 2019-07-20 MED ORDER — GABAPENTIN 300 MG PO CAPS: 600.0000 mg | ORAL_CAPSULE | Freq: Two times a day (BID) | ORAL | 1 refills | Status: DC

## 2019-07-20 MED ORDER — METFORMIN HCL 500 MG PO TABS: 500.0000 mg | ORAL_TABLET | Freq: Two times a day (BID) | ORAL | 1 refills | Status: DC

## 2019-07-20 NOTE — Telephone Encounter (Signed)
I called pt- daughter states pt was taking gabapentin 2 bid. Last time we refilled it we filled 1 bid. She has continued to take 2 bid. While she was seeing a MD in [place] this was increased to 2 bid. Pt is completely out and is requesting new Rx to [facility] in [place].  Also, pt states you mentioned discontinuing her metformin at last labs. Is this accurate? If not she needs a refill on Metformin.

## 2019-07-22 NOTE — Telephone Encounter (Signed)
Pam called to see what can be done for the Pt since she has been out of medication(Gabapentin)  for a few days and the pharmacy wont fill Rx until Friday. She wanted to know if there was anyway anything can be called in today for the Pt that is in pain and not able to sleep. Please advise  Pt has started taking 2 pills 2x's a day with her last Rx instead of waiting until this new Rx to change the dose and has run out before the due date

## 2019-07-22 NOTE — Telephone Encounter (Signed)
Spoke to [employer] (pt dtr) and she stated that they pharmacy was not able to fill until Friday and pt is out of meds due to increase in dose.   Called pharmacy and informed sig was increased. Pharmacy stated that they will fill the medication.   Pam contacted and informed of same.

## 2019-08-24 ENCOUNTER — Other Ambulatory Visit: Payer: Self-pay | Admitting: Internal Medicine

## 2019-08-24 DIAGNOSIS — K2100 Gastro-esophageal reflux disease with esophagitis, without bleeding: Secondary | ICD-10-CM

## 2019-08-31 ENCOUNTER — Other Ambulatory Visit: Payer: Self-pay | Admitting: Internal Medicine

## 2019-08-31 DIAGNOSIS — K2100 Gastro-esophageal reflux disease with esophagitis, without bleeding: Secondary | ICD-10-CM

## 2019-08-31 MED ORDER — DEXILANT 60 MG PO CPDR: 1.0000 | DELAYED_RELEASE_CAPSULE | Freq: Every day | ORAL | 1 refills | Status: DC

## 2019-09-23 ENCOUNTER — Encounter: Payer: Self-pay | Admitting: Internal Medicine

## 2019-09-23 ENCOUNTER — Ambulatory Visit: Payer: PPO | Admitting: Family

## 2019-09-23 ENCOUNTER — Other Ambulatory Visit: Payer: Self-pay

## 2019-09-23 ENCOUNTER — Ambulatory Visit (INDEPENDENT_AMBULATORY_CARE_PROVIDER_SITE_OTHER): Payer: PPO | Admitting: Internal Medicine

## 2019-09-23 ENCOUNTER — Ambulatory Visit (INDEPENDENT_AMBULATORY_CARE_PROVIDER_SITE_OTHER): Payer: PPO

## 2019-09-23 VITALS — BP 150/78 | HR 78 | Temp 98.4°F | Resp 16 | Ht 60.0 in | Wt 127.2 lb

## 2019-09-23 DIAGNOSIS — S8991XA Unspecified injury of right lower leg, initial encounter: Secondary | ICD-10-CM

## 2019-09-23 DIAGNOSIS — I10 Essential (primary) hypertension: Secondary | ICD-10-CM

## 2019-09-23 DIAGNOSIS — S8011XA Contusion of right lower leg, initial encounter: Secondary | ICD-10-CM | POA: Diagnosis not present

## 2019-09-23 DIAGNOSIS — L02415 Cutaneous abscess of right lower limb: Secondary | ICD-10-CM

## 2019-09-23 DIAGNOSIS — E785 Hyperlipidemia, unspecified: Secondary | ICD-10-CM

## 2019-09-23 DIAGNOSIS — M79606 Pain in leg, unspecified: Secondary | ICD-10-CM

## 2019-09-23 DIAGNOSIS — E118 Type 2 diabetes mellitus with unspecified complications: Secondary | ICD-10-CM

## 2019-09-23 NOTE — Progress Notes (Signed)
Appointment was scheduled inappropriately today; patient needs be seen in the office and is re-scheduled for later this afternoon to see her PCP.  She had +COVID test on 08/13/2019 and is 21 days out/ asymptomatic at this time except for some residual changes to her sense of smell and taste.

## 2019-09-23 NOTE — Progress Notes (Signed)
Subjective:  Patient ID: [name] [name], female    DOB: 04-18-1941  Age: 79 y.o. MRN: [id number]  CC: Leg Injury  This visit occurred during the SARS-CoV-2 public health emergency.  Safety protocols were in place, including screening questions prior to the visit, additional usage of staff PPE, and extensive cleaning of exam room while observing appropriate contact time as indicated for disinfecting solutions.    HPI [name] [name] presents for a concern about her RLE. She tells me that her dog ran into the anterior aspect of her right lower leg about 3 weeks ago. She is concerned because the area feels painful, swollen, and is not getting any better. She denies fever, chills, chest pain, or shortness of breath.  Outpatient Medications Prior to Visit  Medication Sig Dispense Refill  . amLODipine (NORVASC) 5 MG tablet Take 1 tablet (5 mg total) by mouth daily. 90 tablet 1  . Ascorbic Acid (VITAMIN C) 1000 MG tablet Take 1,000 mg by mouth daily.      Marland Kitchen aspirin EC 81 MG tablet Take 81 mg by mouth every morning.     Marland Kitchen atorvastatin (LIPITOR) 80 MG tablet Take 1 tablet (80 mg total) by mouth every evening. 90 tablet 1  . Cholecalciferol (VITAMIN D3) 1000 UNITS CAPS Take 1 capsule by mouth daily.      . clopidogrel (PLAVIX) 75 MG tablet Take 1 tablet (75 mg total) by mouth daily. 90 tablet 1  . dexlansoprazole (DEXILANT) 60 MG capsule Take 1 capsule (60 mg total) by mouth daily. 90 capsule 1  . dicyclomine (BENTYL) 10 MG capsule Take 1 capsule (10 mg total) by mouth 4 (four) times daily -  before meals and at bedtime. 360 capsule 1  . ezetimibe (ZETIA) 10 MG tablet Take 1 tablet (10 mg total) by mouth daily. 90 tablet 1  . gabapentin (NEURONTIN) 300 MG capsule Take 2 capsules (600 mg total) by mouth 2 (two) times daily. 360 capsule 1  . metoprolol succinate (TOPROL-XL) 25 MG 24 hr tablet Take 1 tablet (25 mg total) by mouth daily. 90 tablet 1  . metoprolol tartrate (LOPRESSOR) 100 MG  tablet Take by mouth.    . olmesartan (BENICAR) 40 MG tablet Take 1 tablet (40 mg total) by mouth daily. 90 tablet 1  . Omega-3 Fatty Acids (FISH OIL) 1000 MG CAPS Take 2 capsules by mouth 4 (four) times daily.     Marland Kitchen venlafaxine XR (EFFEXOR-XR) 37.5 MG 24 hr capsule TAKE ONE CAPSULE BY MOUTH ONCE DAILY WITH BREAKFAST 90 capsule 1  . metFORMIN (GLUCOPHAGE) 500 MG tablet Take 1 tablet (500 mg total) by mouth 2 (two) times daily with a meal. (Patient not taking: Reported on 09/23/2019) 180 tablet 1  . nitroGLYCERIN (NITROSTAT) 0.4 MG SL tablet Place under the tongue.     No facility-administered medications prior to visit.    ROS Review of Systems  Constitutional: Negative for chills, diaphoresis, fatigue and fever.  HENT: Negative.   Eyes: Negative.   Respiratory: Negative for cough, chest tightness, shortness of breath and wheezing.   Cardiovascular: Positive for leg swelling. Negative for chest pain and palpitations.  Gastrointestinal: Negative for abdominal pain, constipation, diarrhea, nausea and vomiting.  Endocrine: Negative.   Genitourinary: Negative.  Negative for difficulty urinating.  Musculoskeletal: Negative.   Skin: Negative for color change.  Neurological: Negative.  Negative for dizziness, weakness, light-headedness and headaches.  Hematological: Negative for adenopathy. Does not bruise/bleed easily.  Psychiatric/Behavioral: Negative.  Objective:  BP (!) 150/78 (BP Location: Left Arm, Patient Position: Sitting, Cuff Size: Normal)   Pulse 78   Temp 98.4 F (36.9 C) (Oral)   Ht 5' (1.524 m)   Wt 127 lb 4 oz (57.7 kg)   SpO2 92%   BMI 24.85 kg/m   BP Readings from Last 3 Encounters:  09/23/19 (!) 150/78  05/20/19 (!) 150/72  04/30/19 (!) 146/70    Wt Readings from Last 3 Encounters:  09/23/19 127 lb 4 oz (57.7 kg)  05/20/19 119 lb (54 kg)  04/30/19 120 lb 8 oz (54.7 kg)    Physical Exam Vitals reviewed.  HENT:     Nose: Nose normal.     Mouth/Throat:       Mouth: Mucous membranes are moist.  Eyes:     General: No scleral icterus.    Conjunctiva/sclera: Conjunctivae normal.  Cardiovascular:     Rate and Rhythm: Normal rate and regular rhythm.     Heart sounds: No murmur.  Pulmonary:     Effort: Pulmonary effort is normal.     Breath sounds: No wheezing or rales.  Abdominal:     General: Abdomen is flat.     Palpations: There is no mass.     Tenderness: There is no abdominal tenderness. There is no guarding.  Musculoskeletal:        General: Normal range of motion.     Cervical back: Neck supple.     Right lower leg: No edema.     Left lower leg: No edema.       Legs:  Lymphadenopathy:     Cervical: No cervical adenopathy.  Skin:    General: Skin is warm and dry.  Neurological:     General: No focal deficit present.  Psychiatric:        Mood and Affect: Mood normal.        Behavior: Behavior normal.     Lab Results  Component Value Date   WBC 8.9 10/28/2018   HGB 12.9 10/28/2018   HCT 40.3 10/28/2018   PLT 349 10/28/2018   GLUCOSE 117 (H) 04/30/2019   CHOL 94 04/30/2019   TRIG 85.0 04/30/2019   HDL 50.20 04/30/2019   LDLDIRECT 68.0 03/10/2015   LDLCALC 26 04/30/2019   ALT 22 07/15/2018   AST 20 07/15/2018   NA 140 04/30/2019   K 4.3 04/30/2019   CL 101 04/30/2019   CREATININE 0.83 04/30/2019   BUN 18 04/30/2019   CO2 33 (H) 04/30/2019   TSH 2.43 07/15/2018   INR 1.09 01/23/2018   HGBA1C 6.5 04/30/2019   MICROALBUR 2.6 (H) 07/15/2018    CT FEMUR LEFT WO CONTRAST  Result Date: 01/29/2018 CLINICAL DATA:  Acute onset of left thigh swelling. EXAM: CT OF THE LOWER LEFT EXTREMITY WITHOUT CONTRAST TECHNIQUE: Multidetector CT imaging of the lower left extremity was performed according to the standard protocol. COMPARISON:  CT of the left femur performed 01/23/2018 FINDINGS: Bones/Joint/Cartilage There is no evidence of fracture or dislocation. No osseous erosions are seen. The left femoral head remains seated at  the acetabulum. Mild subcortical cystic change is noted at the superior left acetabulum. 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 There is no evidence of loosening. The inferior aspect of the prosthesis is incompletely imaged. A small knee joint effusion is suspected. Ligaments Suboptimally assessed by CT. Muscles and Tendons Vague soft tissue edema is noted tracking about the proximal quadriceps musculature. Vague intramuscular hematoma is suggested at  the lateral aspect of the quadriceps musculature. Underlying myositis cannot be excluded. No abscess is seen. Would correlate for recent traumatic injury. Soft tissues Diffuse mild soft tissue edema is noted tracking along the left thigh, with posterior sparing. The visualized portions of the pelvis are unremarkable, aside from scattered diverticulosis along the sigmoid colon. Postoperative and posttraumatic change is noted about the pubic symphysis. IMPRESSION: 1. No evidence of fracture or dislocation. 2. Vague intramuscular hematoma suggested at the lateral aspect of the quadriceps musculature. Underlying myositis cannot be excluded. Would correlate for recent traumatic injury. No evidence of abscess at this time. 3. Diffuse mild soft tissue edema tracking along the left thigh, with posterior sparing. 4. Small knee joint effusion suspected. 5. Scattered diverticulosis along the sigmoid colon. Electronically Signed   By: Garald Balding M.D.   On: 01/29/2018 22:33   CLINICAL DATA:  Injured right leg 3 weeks ago. Persistent pain, bruising and swelling.  EXAM: RIGHT TIBIA AND FIBULA - 2 VIEW  COMPARISON:  None.  FINDINGS: Moderate degenerative changes are noted at the knee joint. Mild degenerative changes noted at the ankle joint. The tibia and fibula are intact. No acute fracture or bone lesion.  IMPRESSION: No acute bony findings.   Electronically Signed   By: Marijo Sanes M.D.   On: 09/23/2019 16:18  After  informed written consent was obtained, using Betadine for cleansing and 1% Lidocaine with epinephrine for anesthetic (3 cc used). With sterile technique a 6 mm punch incision was made and the cavity was explored. Clotted blood was evacuated. There was no exudate or FB. The cavity was packed with iodoform. Hemostasis was obtained by pressure. The procedure was well tolerated without complications.   Assessment & Plan:   Arieal was seen today for leg injury.  Diagnoses and all orders for this visit:  Essential hypertension -     CBC with Differential/Platelet -     Basic metabolic panel  Controlled type 2 diabetes mellitus with complication, without long-term current use of insulin (HCC) -     Basic metabolic panel -     Hemoglobin A1c  Hyperlipidemia with target LDL less than 100 -     Hepatic function panel  Blunt trauma of right lower leg, initial encounter- She has a hematoma.  Since is is still symptomatic I decided to open and drain it today.  She tolerated this well.  The cavity is packed with iodoform.  She tells me a family member will remove the iodoform in 48 hours.  She will return here in about 5 days to have the area rechecked.  She will let me know sooner if she develops any new or worsening symptoms. -     DG Tibia/Fibula Right; Future -     Wound culture  Abscess of right lower leg- the clx is neg for infection. Antibiotics are not indicated. -     WOUND CULTURE; Future   I am having [name] F. Drotar "FAYE" maintain her Fish Oil, Vitamin D3, vitamin C, aspirin EC, atorvastatin, ezetimibe, clopidogrel, venlafaxine XR, olmesartan, metoprolol succinate, amLODipine, dicyclomine, gabapentin, metFORMIN, Dexilant, metoprolol tartrate, and nitroGLYCERIN.  No orders of the defined types were placed in this encounter.    Follow-up: Return in about 6 days (around 09/29/2019).  [name], MD

## 2019-09-23 NOTE — Patient Instructions (Addendum)
Incision and Drainage Incision and drainage is a surgical procedure to open and drain a fluid-filled sac. The sac may be filled with pus, mucus, or blood. Examples of fluid-filled sacs that may need surgical drainage include cysts, skin infections (abscesses), and red lumps that develop from a ruptured cyst or a small abscess (boils). You may need this procedure if the affected area is large, painful, infected, or not healing well. Tell a health care provider about:  Any allergies you have.  All medicines you are taking, including vitamins, herbs, eye drops, creams, and over-the-counter medicines.  Any problems you or family members have had with anesthetic medicines.  Any blood disorders you have or have had.  Any surgeries you have had.  Any medical conditions you have or have had.  Whether you are pregnant or may be pregnant. What are the risks? Generally, this is a safe procedure. However, problems may occur, including:  Infection.  Bleeding.  Allergic reactions to medicines.  Scarring.  The cyst or abscess returns.  Damage to nerves or vessels. What happens before the procedure? Medicine Ask your health care provider about:  Changing or stopping your regular medicines. This is especially important if you are taking diabetes medicines or blood thinners.  Taking medicines such as aspirin and ibuprofen. These medicines can thin your blood. Do not take these medicines unless your health care provider tells you to take them.  Taking over-the-counter medicines, vitamins, herbs, and supplements. Tests You may have an exam or testing. These may include:  Ultrasound or other imaging tests to see how large or deep the fluid-filled sac is.  Blood tests to check for infection. General instructions  Follow instructions from your health care provider about eating or drinking restrictions.  Plan to have someone take you home from the hospital or clinic.  Ask your health care  provider whether a responsible adult should care for you for at least 24 hours after you leave the hospital or clinic. This is important.  You may get a tetanus shot.  Ask your health care provider: ? How your surgery site will be marked or identified. ? What steps will be taken to help prevent infection. These may include:  Removing hair at the surgery site.  Washing skin with a germ-killing soap.  Receiving antibiotic medicine. What happens during the procedure?   An IV may be inserted into one of your veins.  You will be given one or more of the following: ? A medicine to help you relax (sedative). ? A medicine to numb the area (local anesthetic). ? A medicine to make you fall asleep (general anesthetic).  An incision will be made in the top of the fluid-filled sac.  Pus, blood, and mucus will be squeezed out, and a syringe or tube (drain) may be used to empty more fluid from the sac.  Your health care provider will do one of the following. He or she may: ? Leave the drain in place for several weeks to drain more fluid. ? Stitch open the edges of the incision to make a long-term opening for drainage (marsupialization).  The inside of the sac may be washed out (irrigated) with a sterile solution and packed with gauze before it is covered with a bandage (dressing).  Your health care provider do a culture test of the drainage fluid. The procedure may vary among health care providers and hospitals. What happens after the procedure?  Your blood pressure, heart rate, breathing rate, and blood oxygen level   will be monitored often until you leave the hospital or clinic.  Do not drive for 24 hours if you were given a sedative during your procedure. Summary  Incision and drainage is a surgical procedure to open and drain a fluid-filled sac. The sac may be filled with pus, mucus, or blood.  Before the procedure, you may be given antibiotic medicine to treat or help prevent  infection.  During the procedure, an incision will be made in the top of the fluid-filled sac. Pus, blood, and mucus is squeezed out, and a syringe or tube (drain) may be used to empty more fluid from the sac.  The inside of the sac may be washed out (irrigated) with a sterile solution and packed with gauze before it is covered with a bandage (dressing). This information is not intended to replace advice given to you by your health care provider. Make sure you discuss any questions you have with your health care provider. Document Revised: 07/21/2018 Document Reviewed: 07/21/2018 Elsevier Patient Education  2020 [employer]  

## 2019-09-26 LAB — WOUND CULTURE: RESULT:: NO GROWTH

## 2019-09-29 ENCOUNTER — Ambulatory Visit (INDEPENDENT_AMBULATORY_CARE_PROVIDER_SITE_OTHER): Payer: PPO | Admitting: Internal Medicine

## 2019-09-29 ENCOUNTER — Encounter: Payer: Self-pay | Admitting: Internal Medicine

## 2019-09-29 ENCOUNTER — Other Ambulatory Visit: Payer: Self-pay

## 2019-09-29 ENCOUNTER — Telehealth: Payer: Self-pay

## 2019-09-29 VITALS — BP 150/60 | HR 78 | Temp 98.2°F | Resp 16 | Ht 60.0 in | Wt 125.0 lb

## 2019-09-29 DIAGNOSIS — L02415 Cutaneous abscess of right lower limb: Secondary | ICD-10-CM

## 2019-09-29 DIAGNOSIS — E118 Type 2 diabetes mellitus with unspecified complications: Secondary | ICD-10-CM | POA: Diagnosis not present

## 2019-09-29 DIAGNOSIS — I10 Essential (primary) hypertension: Secondary | ICD-10-CM | POA: Diagnosis not present

## 2019-09-29 LAB — HEPATIC FUNCTION PANEL
ALT: 18 U/L (ref 0–35)
AST: 20 U/L (ref 0–37)
Albumin: 4.1 g/dL (ref 3.5–5.2)
Alkaline Phosphatase: 53 U/L (ref 39–117)
Bilirubin, Direct: 0.1 mg/dL (ref 0.0–0.3)
Total Bilirubin: 0.3 mg/dL (ref 0.2–1.2)
Total Protein: 6.9 g/dL (ref 6.0–8.3)

## 2019-09-29 LAB — CBC WITH DIFFERENTIAL/PLATELET
Basophils Absolute: 0.1 10*3/uL (ref 0.0–0.1)
Basophils Relative: 1.4 % (ref 0.0–3.0)
Eosinophils Absolute: 0.2 10*3/uL (ref 0.0–0.7)
Eosinophils Relative: 2.8 % (ref 0.0–5.0)
HCT: 40 % (ref 36.0–46.0)
Hemoglobin: 13.2 g/dL (ref 12.0–15.0)
Lymphocytes Relative: 35.9 % (ref 12.0–46.0)
Lymphs Abs: 2.7 10*3/uL (ref 0.7–4.0)
MCHC: 32.9 g/dL (ref 30.0–36.0)
MCV: 95.4 fl (ref 78.0–100.0)
Monocytes Absolute: 0.7 10*3/uL (ref 0.1–1.0)
Monocytes Relative: 8.9 % (ref 3.0–12.0)
Neutro Abs: 3.9 10*3/uL (ref 1.4–7.7)
Neutrophils Relative %: 51 % (ref 43.0–77.0)
Platelets: 301 10*3/uL (ref 150.0–400.0)
RBC: 4.2 Mil/uL (ref 3.87–5.11)
RDW: 14 % (ref 11.5–15.5)
WBC: 7.6 10*3/uL (ref 4.0–10.5)

## 2019-09-29 LAB — BASIC METABOLIC PANEL
BUN: 16 mg/dL (ref 6–23)
CO2: 32 mEq/L (ref 19–32)
Calcium: 9.1 mg/dL (ref 8.4–10.5)
Chloride: 99 mEq/L (ref 96–112)
Creatinine, Ser: 0.83 mg/dL (ref 0.40–1.20)
GFR: 66.16 mL/min (ref >60.00)
Glucose, Bld: 138 mg/dL — ABNORMAL HIGH (ref 70–99)
Potassium: 4 mEq/L (ref 3.5–5.1)
Sodium: 136 mEq/L (ref 135–145)

## 2019-09-29 LAB — HEMOGLOBIN A1C: Hgb A1c MFr Bld: 6.6 % — ABNORMAL HIGH (ref 4.6–6.5)

## 2019-09-29 NOTE — Progress Notes (Signed)
Subjective:  Patient ID: [name] [name], female    DOB: 07-22-1941  Age: 79 y.o. MRN: [id number]  CC: Wound Check and Diabetes  This visit occurred during the SARS-CoV-2 public health emergency.  Safety protocols were in place, including screening questions prior to the visit, additional usage of staff PPE, and extensive cleaning of exam room while observing appropriate contact time as indicated for disinfecting solutions.    HPI [name] [name] presents for f/up - She underwent incision and drainage of a hematoma on her right lower extremity about 5 days ago.  The culture was negative for infection.  Her daughter has removed the iodoform packing.  She tells me the area feels better with much less pain, redness, and swelling.  Outpatient Medications Prior to Visit  Medication Sig Dispense Refill  . amLODipine (NORVASC) 5 MG tablet Take 1 tablet (5 mg total) by mouth daily. 90 tablet 1  . Ascorbic Acid (VITAMIN C) 1000 MG tablet Take 1,000 mg by mouth daily.      Marland Kitchen aspirin EC 81 MG tablet Take 81 mg by mouth every morning.     Marland Kitchen atorvastatin (LIPITOR) 80 MG tablet Take 1 tablet (80 mg total) by mouth every evening. 90 tablet 1  . Cholecalciferol (VITAMIN D3) 1000 UNITS CAPS Take 1 capsule by mouth daily.      . clopidogrel (PLAVIX) 75 MG tablet Take 1 tablet (75 mg total) by mouth daily. 90 tablet 1  . dexlansoprazole (DEXILANT) 60 MG capsule Take 1 capsule (60 mg total) by mouth daily. 90 capsule 1  . dicyclomine (BENTYL) 10 MG capsule Take 1 capsule (10 mg total) by mouth 4 (four) times daily -  before meals and at bedtime. 360 capsule 1  . ezetimibe (ZETIA) 10 MG tablet Take 1 tablet (10 mg total) by mouth daily. 90 tablet 1  . gabapentin (NEURONTIN) 300 MG capsule Take 2 capsules (600 mg total) by mouth 2 (two) times daily. 360 capsule 1  . metFORMIN (GLUCOPHAGE) 500 MG tablet Take 1 tablet (500 mg total) by mouth 2 (two) times daily with a meal. 180 tablet 1  . metoprolol succinate  (TOPROL-XL) 25 MG 24 hr tablet Take 1 tablet (25 mg total) by mouth daily. 90 tablet 1  . olmesartan (BENICAR) 40 MG tablet Take 1 tablet (40 mg total) by mouth daily. 90 tablet 1  . Omega-3 Fatty Acids (FISH OIL) 1000 MG CAPS Take 2 capsules by mouth 4 (four) times daily.     Marland Kitchen venlafaxine XR (EFFEXOR-XR) 37.5 MG 24 hr capsule TAKE ONE CAPSULE BY MOUTH ONCE DAILY WITH BREAKFAST 90 capsule 1  . nitroGLYCERIN (NITROSTAT) 0.4 MG SL tablet Place under the tongue.    . metoprolol tartrate (LOPRESSOR) 100 MG tablet Take by mouth.     No facility-administered medications prior to visit.    ROS Review of Systems  Constitutional: Negative for chills, diaphoresis, fatigue and fever.  HENT: Negative.   Eyes: Negative for visual disturbance.  Respiratory: Negative for cough, chest tightness, shortness of breath and wheezing.   Cardiovascular: Negative for chest pain, palpitations and leg swelling.  Gastrointestinal: Negative for abdominal pain, constipation, diarrhea, nausea and vomiting.  Endocrine: Negative.  Negative for polyuria.  Genitourinary: Negative.  Negative for difficulty urinating.  Musculoskeletal: Negative.   Skin: Positive for wound. Negative for color change.  Neurological: Negative.   Hematological: Negative.   Psychiatric/Behavioral: Negative.     Objective:  BP (!) 150/60 (BP Location: Left Arm,  Patient Position: Sitting, Cuff Size: Normal)   Pulse 78   Temp 98.2 F (36.8 C) (Oral)   Resp 16   Ht 5' (1.524 m)   Wt 125 lb (56.7 kg)   SpO2 95%   BMI 24.41 kg/m   BP Readings from Last 3 Encounters:  09/29/19 (!) 150/60  09/23/19 (!) 150/78  05/20/19 (!) 150/72    Wt Readings from Last 3 Encounters:  09/29/19 125 lb (56.7 kg)  09/23/19 127 lb 4 oz (57.7 kg)  05/20/19 119 lb (54 kg)    Physical Exam Vitals reviewed.  HENT:     Nose: Nose normal.     Mouth/Throat:     Mouth: Mucous membranes are moist.  Eyes:     General: No scleral icterus.     Conjunctiva/sclera: Conjunctivae normal.  Cardiovascular:     Rate and Rhythm: Normal rate and regular rhythm.     Heart sounds: No murmur.  Pulmonary:     Effort: Pulmonary effort is normal.     Breath sounds: No stridor. No wheezing, rhonchi or rales.  Abdominal:     General: Abdomen is flat. Bowel sounds are normal. There is no distension.     Palpations: Abdomen is soft. There is no hepatomegaly or splenomegaly.     Tenderness: There is no abdominal tenderness.  Musculoskeletal:        General: Normal range of motion.     Cervical back: Neck supple.     Right lower leg: Edema (trace) present.     Left lower leg: Edema (trace) present.       Legs:  Lymphadenopathy:     Cervical: No cervical adenopathy.  Skin:    General: Skin is warm and dry.  Neurological:     General: No focal deficit present.     Lab Results  Component Value Date   WBC 7.6 09/29/2019   HGB 13.2 09/29/2019   HCT 40.0 09/29/2019   PLT 301.0 09/29/2019   GLUCOSE 138 (H) 09/29/2019   CHOL 94 04/30/2019   TRIG 85.0 04/30/2019   HDL 50.20 04/30/2019   LDLDIRECT 68.0 03/10/2015   LDLCALC 26 04/30/2019   ALT 18 09/29/2019   AST 20 09/29/2019   NA 136 09/29/2019   K 4.0 09/29/2019   CL 99 09/29/2019   CREATININE 0.83 09/29/2019   BUN 16 09/29/2019   CO2 32 09/29/2019   TSH 2.43 07/15/2018   INR 1.09 01/23/2018   HGBA1C 6.6 (H) 09/29/2019   MICROALBUR 2.6 (H) 07/15/2018    CT FEMUR LEFT WO CONTRAST  Result Date: 01/29/2018 CLINICAL DATA:  Acute onset of left thigh swelling. EXAM: CT OF THE LOWER LEFT EXTREMITY WITHOUT CONTRAST TECHNIQUE: Multidetector CT imaging of the lower left extremity was performed according to the standard protocol. COMPARISON:  CT of the left femur performed 01/23/2018 FINDINGS: Bones/Joint/Cartilage There is no evidence of fracture or dislocation. No osseous erosions are seen. The left femoral head remains seated at the acetabulum. Mild subcortical cystic change is noted at  the superior left acetabulum. 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 There is no evidence of loosening. The inferior aspect of the prosthesis is incompletely imaged. A small knee joint effusion is suspected. Ligaments Suboptimally assessed by CT. Muscles and Tendons Vague soft tissue edema is noted tracking about the proximal quadriceps musculature. Vague intramuscular hematoma is suggested at the lateral aspect of the quadriceps musculature. Underlying myositis cannot be excluded. No abscess is seen. Would correlate for recent  traumatic injury. Soft tissues Diffuse mild soft tissue edema is noted tracking along the left thigh, with posterior sparing. The visualized portions of the pelvis are unremarkable, aside from scattered diverticulosis along the sigmoid colon. Postoperative and posttraumatic change is noted about the pubic symphysis. IMPRESSION: 1. No evidence of fracture or dislocation. 2. Vague intramuscular hematoma suggested at the lateral aspect of the quadriceps musculature. Underlying myositis cannot be excluded. Would correlate for recent traumatic injury. No evidence of abscess at this time. 3. Diffuse mild soft tissue edema tracking along the left thigh, with posterior sparing. 4. Small knee joint effusion suspected. 5. Scattered diverticulosis along the sigmoid colon. Electronically Signed   By: Garald Balding M.D.   On: 01/29/2018 22:33    Assessment & Plan:   Nobie was seen today for wound check and diabetes.  Diagnoses and all orders for this visit:  Essential hypertension- Her blood pressure is adequately well controlled.  Electrolytes and renal function are normal.  Controlled type 2 diabetes mellitus with complication, without long-term current use of insulin ([place])- Her A1c is at 6.6%.  She has achieved adequate glycemic control. -     [facility] Diabetes Foot Exam  Abscess of right lower leg- Improvement noted.  This is resolving without complications.   She was educated regarding wound care and asked to elevate her lower extremity over the next few weeks.   I have discontinued Mckinzey F. Akamine "FAYE"'s metoprolol tartrate. I am also having her maintain her Fish Oil, Vitamin D3, vitamin C, aspirin EC, atorvastatin, ezetimibe, clopidogrel, venlafaxine XR, olmesartan, metoprolol succinate, amLODipine, dicyclomine, gabapentin, metFORMIN, Dexilant, and nitroGLYCERIN.  No orders of the defined types were placed in this encounter.    Follow-up: Return in about 6 months (around 03/28/2020).  [name], MD

## 2019-09-29 NOTE — Telephone Encounter (Signed)
Patient wpi;d

## 2019-09-30 ENCOUNTER — Encounter: Payer: Self-pay | Admitting: Internal Medicine

## 2019-09-30 NOTE — Patient Instructions (Signed)
Type 2 Diabetes Mellitus, Diagnosis, Adult Type 2 diabetes (type 2 diabetes mellitus) is a long-term (chronic) disease. In type 2 diabetes, one or both of these problems may be present:  The pancreas does not make enough of a hormone called insulin.  Cells in the body do not respond properly to insulin that the body makes (insulin resistance). Normally, insulin allows blood sugar (glucose) to enter cells in the body. The cells use glucose for energy. Insulin resistance or lack of insulin causes excess glucose to build up in the blood instead of going into cells. As a result, high blood glucose (hyperglycemia) develops. What increases the risk? The following factors may make you more likely to develop type 2 diabetes:  Having a family member with type 2 diabetes.  Being overweight or obese.  Having an inactive (sedentary) lifestyle.  Having been diagnosed with insulin resistance.  Having a history of prediabetes, gestational diabetes, or polycystic ovary syndrome (PCOS).  Being of American-Indian, African-American, Hispanic/Latino, or Asian/Pacific Islander descent. What are the signs or symptoms? In the early stage of this condition, you may not have symptoms. Symptoms develop slowly and may include:  Increased thirst (polydipsia).  Increased hunger(polyphagia).  Increased urination (polyuria).  Increased urination during the night (nocturia).  Unexplained weight loss.  Frequent infections that keep coming back (recurring).  Fatigue.  Weakness.  Vision changes, such as blurry vision.  Cuts or bruises that are slow to heal.  Tingling or numbness in the hands or feet.  Dark patches on the skin (acanthosis nigricans). How is this diagnosed? This condition is diagnosed based on your symptoms, your medical history, a physical exam, and your blood glucose level. Your blood glucose may be checked with one or more of the following blood tests:  A fasting blood glucose (FBG)  test. You will not be allowed to eat (you will fast) for 8 hours or longer before a blood sample is taken.  A random blood glucose test. This test checks blood glucose at any time of day regardless of when you ate.  An A1c (hemoglobin A1c) blood test. This test provides information about blood glucose control over the previous 2-3 months.  An oral glucose tolerance test (OGTT). This test measures your blood glucose at two times: ? After fasting. This is your baseline blood glucose level. ? Two hours after drinking a beverage that contains glucose. You may be diagnosed with type 2 diabetes if:  Your FBG level is 126 mg/dL (7.0 mmol/L) or higher.  Your random blood glucose level is 200 mg/dL (11.1 mmol/L) or higher.  Your A1c level is 6.5% or higher.  Your OGTT result is higher than 200 mg/dL (11.1 mmol/L). These blood tests may be repeated to confirm your diagnosis. How is this treated? Your treatment may be managed by a specialist called an endocrinologist. Type 2 diabetes may be treated by following instructions from your health care provider about:  Making diet and lifestyle changes. This may include: ? Following an individualized nutrition plan that is developed by a diet and nutrition specialist (registered dietitian). ? Exercising regularly. ? Finding ways to manage stress.  Checking your blood glucose level as often as told.  Taking diabetes medicines or insulin daily. This helps to keep your blood glucose levels in the healthy range. ? If you use insulin, you may need to adjust the dosage depending on how physically active you are and what foods you eat. Your health care provider will tell you how to adjust your dosage.    Taking medicines to help prevent complications from diabetes, such as: ? Aspirin. ? Medicine to lower cholesterol. ? Medicine to control blood pressure. Your health care provider will set individualized treatment goals for you. Your goals will be based on  your age, other medical conditions you have, and how you respond to diabetes treatment. Generally, the goal of treatment is to maintain the following blood glucose levels:  Before meals (preprandial): 80-130 mg/dL (4.4-7.2 mmol/L).  After meals (postprandial): below 180 mg/dL (10 mmol/L).  A1c level: less than 7%. Follow these instructions at home: Questions to ask your health care provider  Consider asking the following questions: ? Do I need to meet with a diabetes educator? ? Where can I find a support group for people with diabetes? ? What equipment will I need to manage my diabetes at home? ? What diabetes medicines do I need, and when should I take them? ? How often do I need to check my blood glucose? ? What number can I call if I have questions? ? When is my next appointment? General instructions  Take over-the-counter and prescription medicines only as told by your health care provider.  Keep all follow-up visits as told by your health care provider. This is important.  For more information about diabetes, visit: ? American Diabetes Association (ADA): www.diabetes.org ? American Association of Diabetes Educators (AADE): www.diabeteseducator.org Contact a health care provider if:  Your blood glucose is at or above 240 mg/dL (13.3 mmol/L) for 2 days in a row.  You have been sick or have had a fever for 2 days or longer, and you are not getting better.  You have any of the following problems for more than 6 hours: ? You cannot eat or drink. ? You have nausea and vomiting. ? You have diarrhea. Get help right away if:  Your blood glucose is lower than 54 mg/dL (3.0 mmol/L).  You become confused or you have trouble thinking clearly.  You have difficulty breathing.  You have moderate or large ketone levels in your urine. Summary  Type 2 diabetes (type 2 diabetes mellitus) is a long-term (chronic) disease. In type 2 diabetes, the pancreas does not make enough of a  hormone called insulin, or cells in the body do not respond properly to insulin that the body makes (insulin resistance).  This condition is treated by making diet and lifestyle changes and taking diabetes medicines or insulin.  Your health care provider will set individualized treatment goals for you. Your goals will be based on your age, other medical conditions you have, and how you respond to diabetes treatment.  Keep all follow-up visits as told by your health care provider. This is important. This information is not intended to replace advice given to you by your health care provider. Make sure you discuss any questions you have with your health care provider. Document Revised: 10/18/2017 Document Reviewed: 09/23/2015 Elsevier Patient Education  2020 Elsevier Inc.  

## 2019-11-02 ENCOUNTER — Ambulatory Visit: Payer: PPO | Admitting: Internal Medicine

## 2019-11-09 NOTE — Progress Notes (Addendum)
Cardiology Office Note  Date: 11/10/2019   ID: [name] [name], DOB Oct 07, 1940, MRN [id number]  PCP:  Janith Lima, MD  Cardiologist:  Sherren Mocha, MD Electrophysiologist:  None   Chief Complaint: CAD, HTN, HLD TIA, Dyspnea  History of Present Illness: [name] [name] is a 79 y.o. female with a history of CAD s/p Inf. MI 2007 DES-RCA, HTN, HLD, DM2, TIA. Cath 07/2017 progressive CAD-Mid-distal RCA at previous stent site. POBA to Mid RCA and DES to distal RCA. Mod disease to mid LAD not hemodynamically significant by FFR.  09/2017 TIA symptoms TEE demonstrated AV vegetation. Received IV antiobiotics x 3 wks.  Saw Crista Luria Bhagat PA 07/2018 w/ event monitor for palpitations. No a. Fib. noted.  C/O dyspnea at last visit 05/01/2019. CBC, BMET, ECHO ordered. Echo 8/28/ 2020.  EF 60 to 65%.  Moderate basal septal hypertrophy. Degenerative mitral valve. Trivial TR,  AV vegetation / mobile filamentous structure (Lambels excresence)., Mild AR See report below.  Patient states she has been having some mild chest pain with and without exertion.  States she has been working at her house over the last month getting ready to move back to [place] and has been having some mild chest pain on exertion which occurs sometimes with exertion but is not predictable during exertional activity.  She also states she has the chest pain sometimes at rest.  She denies any radiation to neck, arm, back, jaw, shoulders as previous symptoms prior to her MI in the past.  She denies any associated nausea, vomiting, or diaphoresis.  States she has occasional transient dizziness but no syncopal or near syncopal episode.  States she has some mild dyspnea on exertion but nothing out of the norm per her statement.  States on one episode she took baby aspirin and on another episode she took a sublingual nitroglycerin with relief.    Past Medical History:  Diagnosis Date  . Allergy   . Anxiety   . CAD (coronary  artery disease)    s/p inf MI 2007 - tx w/ DES to RCA // Echo 12/08: EF 60%, mild MR, mild LAE // LHC 08/2009: dLM 20%, LAD beyond diagonal 60-70%, stable from prior catheterization in 2007, ostial circumflex 40-50%, mid RCA stent ok, EF 65% // Myoview 08/2009: EF 76%, small inferoapical fixed defect, no ischemia // Myoview 7/13: no scar or ischemia, EF 69% //    . Cataract    removed both eyes  . Cerebrovascular disease, unspecified   . Chronic kidney disease    frequent Kidney Infections  . Chronic neck pain   . Chronic pain syndrome   . Diverticular disease   . Dizziness   . DJD (degenerative joint disease)   . DM type 2 (diabetes mellitus, type 2) ([place])   . Echocardiogram    Echo 10/2018: EF 55-60, normal RVSF, mod MAC, mod TR, severe AoV calcification and sclerosis with nodular calcium/mobile area of calcium in the LVOT (small veg vs Lambl's excrescence  - consider TEE), mild AI, mild AS (mean 11).   . Echocardiogram 04/2019   Echocardiogram 04/2019: EF 60-65, basal septal hypertrophy, grade 2 diastolic dysfunction, normal wall motion, normal RV SF, mild LAE, mod MAC, trivial MR, mod sclerosis of the aortic valve with mod aortic annular calcification, thin mobile filamentous structure on ventricular side of AV likely representing Lambl's excrescence, mild AI, mild TR  . GERD (gastroesophageal reflux disease)   . H/O hiatal hernia   . HTN (hypertension)   .  IBS (irritable bowel syndrome)   . 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 Internal hemorrhoids   . Ischemic colitis ([place])   . Mitral valve prolapse   . Mixed hyperlipidemia   . Myocardial infarction ([place]) 2007  . Neuromuscular disorder ([place])    hiatal hernia  . Nocturia   . Pancreatitis    1955 an once more  . PONV (postoperative nausea and vomiting)    Difficluty opening mouth wide and turning head. (Cervical Fusion)  . Premature ventricular contractions   . Tubular adenoma of colon   . Ulcer    sam [place]  gi  . Urinary incontinence     Past Surgical History:  Procedure Laterality Date  . APPENDECTOMY  1955  . BLADDER REPAIR  2007  . CARDIAC CATHETERIZATION  2007   Stents  . CERVICAL SPINE SURGERY  07/2005   Dr Consuello Masse  . CHOLECYSTECTOMY  2007  . COLONOSCOPY    . DENTAL SURGERY  05/2013   replaced an inplant  . EYE SURGERY  2011   Bilteral  . I & D KNEE WITH POLY EXCHANGE  09/25/2011   Procedure: IRRIGATION AND DEBRIDEMENT KNEE WITH POLY EXCHANGE;  Surgeon: Johnny Bridge, MD;  Location: [facility];  Service: Orthopedics;  Laterality: Left;  . INCISION AND DRAINAGE ABSCESS / HEMATOMA OF BURSA / KNEE / THIGH  09/2011  . JOINT REPLACEMENT  2012   left  . left Total Knee Replacement  11/2010   Dr Percell Miller  . NECK SURGERY     has had 3 surgeries  . POLYPECTOMY    . POSTERIOR CERVICAL FUSION/FORAMINOTOMY  04/08/2012   Procedure: POSTERIOR CERVICAL FUSION/FORAMINOTOMY LEVEL 2;  Surgeon: Otilio Connors, MD;  Location: [facility];  Service: Neurosurgery;  Laterality: Left;  Left Cervical six-seven Foraminotomy, bilateral cervical seven-thoracic one foraminotomy, cervical six-seven, cervical seven-thoracic one fusion with posterior instrumentation  . TEMPOROMANDIBULAR JOINT SURGERY    . thumb surgery    . TONSILLECTOMY    . TONSILLECTOMY  1950  . TOTAL ABDOMINAL HYSTERECTOMY      Current Outpatient Medications  Medication Sig Dispense Refill  . amLODipine (NORVASC) 5 MG tablet Take 1 tablet (5 mg total) by mouth daily. 90 tablet 1  . Ascorbic Acid (VITAMIN C) 1000 MG tablet Take 1,000 mg by mouth daily.      Marland Kitchen aspirin EC 81 MG tablet Take 81 mg by mouth every morning.     Marland Kitchen atorvastatin (LIPITOR) 80 MG tablet Take 1 tablet (80 mg total) by mouth every evening. 90 tablet 1  . Cholecalciferol (VITAMIN D3) 1000 UNITS CAPS Take 1 capsule by mouth daily.      . clopidogrel (PLAVIX) 75 MG tablet Take 1 tablet (75 mg total) by mouth daily. 90 tablet 1  . dexlansoprazole (DEXILANT) 60 MG capsule Take 1  capsule (60 mg total) by mouth daily. 90 capsule 1  . dicyclomine (BENTYL) 10 MG capsule Take 1 capsule (10 mg total) by mouth 4 (four) times daily -  before meals and at bedtime. 360 capsule 1  . ezetimibe (ZETIA) 10 MG tablet Take 1 tablet (10 mg total) by mouth daily. 90 tablet 1  . gabapentin (NEURONTIN) 300 MG capsule Take 2 capsules (600 mg total) by mouth 2 (two) times daily. 360 capsule 1  . metFORMIN (GLUCOPHAGE) 500 MG tablet Take 1 tablet (500 mg total) by mouth 2 (two) times daily with a meal. 180 tablet 1  . metoprolol succinate (TOPROL-XL) 25  MG 24 hr tablet Take 1 tablet (25 mg total) by mouth daily. 90 tablet 1  . nitroGLYCERIN (NITROSTAT) 0.4 MG SL tablet Place under the tongue.    Marland Kitchen olmesartan (BENICAR) 40 MG tablet Take 1 tablet (40 mg total) by mouth daily. 90 tablet 1  . Omega-3 Fatty Acids (FISH OIL) 1000 MG CAPS Take 2 capsules by mouth 4 (four) times daily.     Marland Kitchen venlafaxine XR (EFFEXOR-XR) 37.5 MG 24 hr capsule TAKE ONE CAPSULE BY MOUTH ONCE DAILY WITH BREAKFAST 90 capsule 1  . isosorbide mononitrate (IMDUR) 30 MG 24 hr tablet Take 1 tablet (30 mg total) by mouth daily. 90 tablet 3   No current facility-administered medications for this visit.   Allergies:  Niacin and related, Nitrofurantoin, Ceftriaxone, Codeine, Prednisone, Sulfonamide derivatives, and Tetracycline   Social History: The patient  reports that she has never smoked. She has never used smokeless tobacco. She reports that she does not drink alcohol or use drugs.   Family History: The patient's family history includes Cancer - Other in her brother; Colon polyps in her sister; Coronary artery disease in some other family members; Heart disease in her mother; Pancreatic cancer in her mother.   ROS:  Please see the history of present illness. Otherwise, complete review of systems is positive for none.  All other systems are reviewed and negative.   Physical Exam: VS:  BP 138/72   Pulse 72   Ht 5' (1.524 m)    Wt 127 lb 6.4 oz (57.8 kg)   SpO2 97%   BMI 24.88 kg/m , BMI Body mass index is 24.88 kg/m.  Wt Readings from Last 3 Encounters:  11/10/19 127 lb 6.4 oz (57.8 kg)  09/29/19 125 lb (56.7 kg)  09/23/19 127 lb 4 oz (57.7 kg)    General: Patient appears comfortable at rest. Neck: Supple, no elevated JVP or carotid bruits, no thyromegaly. Lungs: Clear to auscultation, nonlabored breathing at rest. Cardiac: Regular rate and rhythm, no S3 or soft systolic murmur best heard at RUSB 2/6, no pericardial rub. Extremities: No pitting edema, distal pulses 2+. Skin: Warm and dry. Musculoskeletal: No kyphosis. Neuropsychiatric: Alert and oriented x3, affect grossly appropriate.  ECG: EKG today shows normal sinus rhythm with a rate of 79.  No acute ST or T wave changes.  Left axis deviation, no hypertrophy.  Recent Labwork: 09/29/2019: ALT 18; AST 20; BUN 16; Creatinine, Ser 0.83; Hemoglobin 13.2; Platelets 301.0; Potassium 4.0; Sodium 136     Component Value Date/Time   CHOL 94 04/30/2019 1134   TRIG 85.0 04/30/2019 1134   TRIG 164 (H) 06/24/2006 0738   HDL 50.20 04/30/2019 1134   CHOLHDL 2 04/30/2019 1134   VLDL 17.0 04/30/2019 1134   LDLCALC 26 04/30/2019 1134   LDLDIRECT 68.0 03/10/2015 1530    Other Studies Reviewed Today:   Echocardiogram 05/01/2019 1. The left ventricle has normal systolic function with an ejection fraction of 60-65%. The cavity size was normal. Moderate Basal septal hypertrophy. Left ventricular diastolic Doppler parameters are consistent with pseudonormalization. No evidence of left ventricular regional wall motion abnormalities. 2. The right ventricle has normal systolic function. The cavity was normal. There is no increase in right ventricular wall thickness. 3. Left atrial size was mildly dilated. 4. The mitral valve is degenerative. Moderate thickening of the anterior mitral valve leaflet. Mild calcification of the anterior mitral valve leaflet. There is  moderate mitral annular calcification present. There is trivial MR. 5. The aortic valve is  tricuspid. Moderate sclerosis of the aortic valve and moderate aortic annular calcification. 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 Aortic valve regurgitation is mild by color flow Doppler. 6. The aorta is normal unless otherwise noted.  Event Monitor 08/31/18 The basic rhythm is normal sinus with an average HR of 72 bpm There is one short run of NSVT - 6 beats There is no atrial fibrillation or flutter No sustained arrhythmia No pathologic pauses  TEE 09/26/17 ([place]) Mild LVH, normal LVSF, no RWMA, mild LAE, mobile structure on AoV extends into LVOT c/w SBE, mild AI, mod TR  Carotid US 09/24/17 ([place]) Conclusions: Mild plaque (<50%) is present in the bilateral internal carotid arteries that is not associated with a hemodynamically significant stenosis.  Antegrade flow with a normal hemodynamic profile was present in both vertebral arteries.  Echo ([place]) 09/24/17 Mild conc LVH, normal LVSF, no RWMA, mild LAE, mobile linear structure in LVOT side of AoV (Lambl's excrescence vs vegetation), mod TR, mild pulmonary HTN, mild PI  Cardiac Catheterization 07/15/17 [facility] in [place], [place]) [place] dist 20 LAD prox 50 (FFR 0.94 - no hemodynamically significant) LCx prox 30; OM1 prox 60 RCA prox 20, mid stent patent with 50 ISR, dist 80 at edge of prior stent PCI:  POBA to mid RCA ISR PCI:  2.5 x 12 mm Resolute DES to dist RCA overlapping with prior stent  Echo 07/15/17 [facility] in [place], [place]) Borderline conc LVH, EF 55-60, Gr 1 DD, mild LAE, trivial MR, mild AoV sclerosis, trivial AI, mild to mod TR, RVSP 40-45 (mild pulmo HTN)  Myoview 03/05/12 Overall Impression: Normal stress nuclear study. LV Ejection Fraction: 69%. LV Wall Motion: NL LV Function;  NL Wall Motion  Assessment and Plan:  1. CAD in native artery   2. Shortness of breath   3. History of bacterial endocarditis   4. Chest pain of uncertain etiology   5. Essential hypertension    1. CAD in native artery History of coronary artery disease status post MI with stenting to mid RCA previously and recent overlapping stent to RCA in 2018.  He has been complaining of some chest pain with and without exertion.  States on one episode she took nitroglycerin,  the other episode she took baby aspirin with relief.  She denies any radiation to neck arm, back, jaw.  She denies any significant dyspnea on exertion out of the norm for her.  Continue amlodipine 5 mg, aspirin 81 mg, Plavix 75 mg, atorvastatin 80 mg, Zetia 10 mg, nitroglycerin sublingual as needed, Toprol XL 25 mg daily.  Start Imdur 30 mg p.o. daily.  Get Lexiscan Myoview stress test.   2. Shortness of breath  And previous complaint of shortness of breath at last visit.  Echocardiogram was performed   8/28/ 2020.  EF 60 to 65%.  Moderate basal septal hypertrophy. Degenerative mitral valve. Trivial TR,  AV vegetation / mobile filamentous structure (Lambels excresence)., Mild AR.  Patient denies any significant shortness of breath out of her norm.  States she has been getting her house ready for sale cleaning tasks without any significant shortness of breath..  3. History of bacterial endocarditis History of SBE secondary to oral dental infection, resolved with no subsequent episodes.  Needs SBE prophylaxis for any significant surgical procedures.  4.HTN Blood pressure much better today than on previous visits.  138/72.  Continue olmesartan 40 mg daily.  Medication Adjustments/Labs and Tests Ordered: Current medicines are reviewed at length with the patient today.  Concerns regarding medicines are outlined above.   Disposition: Follow-up with Dr Burt Knack or Richardson Dopp PA  Signed, Levell July, NP 11/10/2019 11:18 AM    [place]

## 2019-11-10 ENCOUNTER — Encounter: Payer: Self-pay | Admitting: Family Medicine

## 2019-11-10 ENCOUNTER — Other Ambulatory Visit: Payer: Self-pay

## 2019-11-10 ENCOUNTER — Ambulatory Visit: Payer: PPO | Admitting: Family Medicine

## 2019-11-10 VITALS — BP 138/72 | HR 72 | Ht 60.0 in | Wt 127.4 lb

## 2019-11-10 DIAGNOSIS — R079 Chest pain, unspecified: Secondary | ICD-10-CM | POA: Diagnosis not present

## 2019-11-10 DIAGNOSIS — I10 Essential (primary) hypertension: Secondary | ICD-10-CM

## 2019-11-10 DIAGNOSIS — Z8679 Personal history of other diseases of the circulatory system: Secondary | ICD-10-CM

## 2019-11-10 DIAGNOSIS — I2510 Atherosclerotic heart disease of native coronary artery without angina pectoris: Secondary | ICD-10-CM

## 2019-11-10 DIAGNOSIS — R0602 Shortness of breath: Secondary | ICD-10-CM

## 2019-11-10 MED ORDER — ISOSORBIDE MONONITRATE ER 30 MG PO TB24: 30.0000 mg | ORAL_TABLET | Freq: Every day | ORAL | 3 refills | Status: DC

## 2019-11-10 NOTE — Patient Instructions (Addendum)
Medication Instructions:   Your physician has recommended you make the following change in your medication:   1) Start Imdur 30 mg, 1 tablet by mouth once a day  *If you need a refill on your cardiac medications before your next appointment, please call your pharmacy*  Lab Work:  None ordered today  If you have labs (blood work) drawn today and your tests are completely normal, you will receive your results only by: Marland Kitchen MyChart Message (if you have MyChart) OR . A paper copy in the mail If you have any lab test that is abnormal or we need to change your treatment, we will call you to review the results.  Testing/Procedures:  Your physician has requested that you have a lexiscan myoview. For further information please visit [email or web address]. Please follow instruction sheet, as given.  Follow-Up: At [facility], you and your health needs are our priority.  As part of our continuing mission to provide you with exceptional heart care, we have created designated Provider Care Teams.  These Care Teams include your primary Cardiologist (physician) and Advanced Practice Providers (APPs -  Physician Assistants and Nurse Practitioners) who all work together to provide you with the care you need, when you need it.  We recommend signing up for the patient portal called "MyChart".  Sign up information is provided on this After Visit Summary.  MyChart is used to connect with patients for Virtual Visits (Telemedicine).  Patients are able to view lab/test results, encounter notes, upcoming appointments, etc.  Non-urgent messages can be sent to your provider as well.   To learn more about what you can do with MyChart, go to [email or web address].    Your next appointment:    On 11/30/19 at 10:45AM with Richardson Dopp, PA-C

## 2019-11-11 ENCOUNTER — Telehealth (HOSPITAL_COMMUNITY): Payer: Self-pay

## 2019-11-11 NOTE — Telephone Encounter (Signed)
Spoke with the patient, detailed instructions were given. She stated that she would be here for her test in the am. Asked to call back with any questions. S.Haely Leyland EMTP

## 2019-11-12 ENCOUNTER — Other Ambulatory Visit: Payer: Self-pay

## 2019-11-12 ENCOUNTER — Ambulatory Visit (HOSPITAL_COMMUNITY): Payer: PPO | Attending: Cardiovascular Disease

## 2019-11-12 DIAGNOSIS — R079 Chest pain, unspecified: Secondary | ICD-10-CM | POA: Insufficient documentation

## 2019-11-12 LAB — MYOCARDIAL PERFUSION IMAGING
LV dias vol: 68 mL (ref 46–106)
LV sys vol: 18 mL
Peak HR: 100 {beats}/min
Rest HR: 71 {beats}/min
SDS: 0
SRS: 0
SSS: 0
TID: 1.08

## 2019-11-12 MED ORDER — TECHNETIUM TC 99M TETROFOSMIN IV KIT
10.1000 | PACK | Freq: Once | INTRAVENOUS | Status: AC | PRN
  Administered 2019-11-12: 10.1 via INTRAVENOUS
  Filled 2019-11-12: qty 11

## 2019-11-12 MED ORDER — TECHNETIUM TC 99M TETROFOSMIN IV KIT
32.4000 | PACK | Freq: Once | INTRAVENOUS | Status: AC | PRN
  Administered 2019-11-12: 32.4 via INTRAVENOUS
  Filled 2019-11-12: qty 33

## 2019-11-12 MED ORDER — REGADENOSON 0.4 MG/5ML IV SOLN
0.4000 mg | Freq: Once | INTRAVENOUS | Status: AC
  Administered 2019-11-12: 0.4 mg via INTRAVENOUS

## 2019-11-18 ENCOUNTER — Telehealth: Payer: Self-pay | Admitting: Internal Medicine

## 2019-11-18 DIAGNOSIS — I25119 Atherosclerotic heart disease of native coronary artery with unspecified angina pectoris: Secondary | ICD-10-CM

## 2019-11-18 NOTE — Progress Notes (Signed)
  Chronic Care Management   Note  11/18/2019 Name: SHAKYA FEUER MRN: [id number] DOB: 01-01-1941  KEANNE FETTIG is a 79 y.o. year old female who is a primary care patient of Janith Lima, MD. I reached out to [name] by phone today in response to a referral sent by Ms. Georgina Snell Petsch's PCP, Janith Lima, MD.   Ms. Haut was given information about Chronic Care Management services today including:  1. CCM service includes personalized support from designated clinical staff supervised by her physician, including individualized plan of care and coordination with other care providers 2. 24/7 contact phone numbers for assistance for urgent and routine care needs. 3. Service will only be billed when office clinical staff spend 20 minutes or more in a month to coordinate care. 4. Only one practitioner may furnish and bill the service in a calendar month. 5. The patient may stop CCM services at any time (effective at the end of the month) by phone call to the office staff.   Patient agreed to services and verbal consent obtained.   Follow up plan:   Raynicia Dukes [employer] Scheduler

## 2019-11-19 ENCOUNTER — Other Ambulatory Visit: Payer: Self-pay | Admitting: Internal Medicine

## 2019-11-19 DIAGNOSIS — I2510 Atherosclerotic heart disease of native coronary artery without angina pectoris: Secondary | ICD-10-CM

## 2019-11-19 DIAGNOSIS — I10 Essential (primary) hypertension: Secondary | ICD-10-CM

## 2019-11-19 DIAGNOSIS — E118 Type 2 diabetes mellitus with unspecified complications: Secondary | ICD-10-CM

## 2019-11-29 NOTE — Progress Notes (Deleted)
Cardiology Office Note:    Date:  11/29/2019   ID:  [name] [name], DOB 1941/04/22, MRN [id number]  PCP:  [name] Lima, MD  Cardiologist:  [name] Mocha, MD *** Electrophysiologist:  None   Referring MD: [name] Lima, MD   Chief Complaint:  No chief complaint on file.    Patient Profile:    [name] [name] is a 79 y.o. 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  Hypertension   Hyperlipidemia   Diabetes mellitus   Prior CV studies: Myoview 11/12/2019 EF > 65, normal perfusion (no ischemia or infarction)  Echocardiogram 05/01/2019 EF 60-65, Gr 2 DD, normal RVSF, mild LAE, mod MAC, trivial MR, mod AoV sclerosis with mobile structure representing Lambl's excrescence   Echocardiogram 10/31/2018 EF 55-60, mod MAC, mod TR, calcification of AoV, mild AI, mobile area of calcium in LVOT (small veg vs Lambl's excrescence)  Event Monitor 08/31/18 NSR, Avg HR 72; 1 six beat NSVT, no AFib  TEE 09/26/17 ([name]) Mild LVH, normal LVSF, no RWMA, mild LAE, mobile structure on AoV extends into LVOT c/w SBE, mild AI, mod TR  Carotid US 09/24/17 ([name]) Conclusions: Mild plaque (<50%) is present in the bilateral internal carotid arteries that is not associated with a hemodynamically significant stenosis.  Antegrade flow with a normal hemodynamic profile was present in both vertebral arteries.  Echo ([name]) 09/24/17 Mild conc LVH, normal LVSF, no RWMA, mild LAE, mobile linear structure in LVOT side of AoV (Lambl's excrescence  vs vegetation), mod TR, mild pulmonary HTN, mild PI  Cardiac Catheterization 07/15/17 [name] dist 20 LAD prox 50 (FFR 0.94 - no hemodynamically significant) LCx prox 30; OM1 prox 60 RCA prox 20, mid stent patent with 50 ISR, dist 80 at edge of prior stent PCI:  POBA to mid RCA ISR PCI:  2.5 x 12 mm Resolute DES to dist RCA overlapping with prior stent  Echo 07/15/17 [name] Borderline conc LVH, EF 55-60, Gr 1 DD, mild LAE, trivial MR, mild AoV sclerosis, trivial AI, mild to mod TR, RVSP 40-45 (mild pulmo HTN)  Myoview 03/05/12 Overall Impression: Normal stress nuclear study. LV Ejection Fraction: 69%. LV Wall Motion: NL LV Function; NL Wall Motion   History of Present Illness:    [name] [name] was last seen in clinic by [name] Dung, NP on 11/10/2019.  She complained of some chest pain and a Myoview was obtained.  This was normal demonstrating no ischemia and a normal EF.    The DICTATELATER SmartLink is not supported in this context. ***   Past Medical History:  Diagnosis Date  . Allergy   . Anxiety   . CAD (coronary artery disease)    s/p inf MI 2007 - tx w/ DES to RCA // Echo 12/08: EF 60%, mild MR, mild LAE // LHC 08/2009: dLM 20%, LAD beyond diagonal 60-70%, stable from prior catheterization in 2007, ostial circumflex 40-50%, mid RCA stent ok, EF 65% // Myoview 08/2009: EF 76%,  small inferoapical fixed defect, no ischemia // Myoview 7/13: no scar or ischemia, EF 69% //    . Cataract    removed both eyes  . Cerebrovascular disease, unspecified   . Chronic kidney disease    frequent Kidney Infections  . Chronic neck pain   . Chronic pain syndrome   . Diverticular disease   . Dizziness   . DJD (degenerative joint disease)   . DM type 2 (diabetes mellitus, type 2) ([name])   . Echocardiogram    Echo 10/2018: EF 55-60, normal RVSF, mod MAC, mod TR, severe AoV calcification and sclerosis with nodular calcium/mobile area of  calcium in the LVOT (small veg vs Lambl's excrescence  - consider TEE), mild AI, mild AS (mean 11).   . Echocardiogram 04/2019   Echocardiogram 04/2019: EF 60-65, basal septal hypertrophy, grade 2 diastolic dysfunction, normal wall motion, normal RV SF, mild LAE, mod MAC, trivial MR, mod sclerosis of the aortic valve with mod aortic annular calcification, thin mobile filamentous structure on ventricular side of AV likely representing Lambl's excrescence, mild AI, mild TR  . GERD (gastroesophageal reflux disease)   . H/O hiatal hernia   . HTN (hypertension)   . IBS (irritable bowel syndrome)   . 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 Internal hemorrhoids   . Ischemic colitis ([name])   . Mitral valve prolapse   . Mixed hyperlipidemia   . Myocardial infarction ([place]) 2007  . Neuromuscular disorder ([name])    hiatal hernia  . Nocturia   . Pancreatitis    1955 an once more  . PONV (postoperative nausea and vomiting)    Difficluty opening mouth wide and turning head. (Cervical Fusion)  . Premature ventricular contractions   . Tubular adenoma of colon   . Ulcer    sam [name]  . Urinary incontinence     Current Medications: No outpatient medications have been marked as taking for the 11/30/19 encounter (Appointment) with Richardson Dopp T, PA-C.     Allergies:   Niacin and related, Nitrofurantoin, Ceftriaxone, Codeine, Prednisone, Sulfonamide derivatives, and Tetracycline   Social History   Tobacco Use  . Smoking status: Never Smoker  . Smokeless tobacco: Never Used  Substance Use Topics  . Alcohol use: No  . Drug use: No     Family Hx: The patient's family history includes Cancer - Other in her brother; Colon polyps in her sister; Coronary artery disease in some other family members; Heart disease in her mother; Pancreatic cancer in her mother. There is no history of Colon cancer, Esophageal cancer, Rectal cancer, or Stomach cancer.  ROS   EKGs/Labs/Other Test  Reviewed:    EKG:  EKG is *** ordered today.  The ekg ordered today demonstrates ***  Recent Labs: 09/29/2019: ALT 18; BUN 16; Creatinine, Ser 0.83; Hemoglobin 13.2; Platelets 301.0; Potassium 4.0; Sodium 136   Recent Lipid Panel Lab Results  Component Value Date/Time   CHOL 94 04/30/2019 11:34 AM   TRIG 85.0 04/30/2019 11:34 AM   TRIG 164 (H) 06/24/2006 07:38 AM   HDL 50.20 04/30/2019 11:34 AM   CHOLHDL 2 04/30/2019 11:34 AM   LDLCALC 26 04/30/2019 11:34 AM   LDLDIRECT 68.0 03/10/2015 03:30 PM    Physical Exam:    VS:  There were no vitals taken for this visit.    Wt Readings from Last 3 Encounters:  11/10/19 127 lb 6.4 oz (57.8 kg)  09/29/19 125 lb (56.7 kg)  09/23/19 127 lb 4 oz (  57.7 kg)     Physical Exam ***  ASSESSMENT & PLAN:    *** Coronary artery disease involving native coronary artery of native heart without angina pectoris Hx of inferior MI in 2007 tx with DES to the RCA.  She underwent POBA to the mid RCA stent and DES to the distal RCA (overlapping with the prior stent) in 07/2017 in [place]. She is not having any angina.  She does note shortness of breath with exertion.  Her ECG is unchanged.  At this [name], I do not believe that she needs ischemic testing.  She has multiple stents in the RCA.  She seems to be tolerating dual antiplatelet Rx.  Continue ASA, Plavix, beta-blocker, statin.  Shortness of breath Etiology not clear.  She does not appear to be volume overloaded.  Her ECG is unchanged.  I will obtain a CBC, BMET, BNP today and arrange an echocardiogram.  If her workup is unremarkable and she continues to have symptoms or her symptoms worsen, we will need to see her back sooner to consider further testing.    Essential hypertension She was out of Azilsartan until recently.  She has been back on it 1 week.  Continue current Rx and continue to monitor for now.  If her BP remains > 130/80, consider increasing Amlodipine.    History of bacterial  endocarditis  Obtain follow up echocardiogram as noted.  She will need to take antibiotics prior to any dental procedure.  Preoperative cardiovascular examination Simple dental extraction is considered low risk and she does not need further testing.  She may proceed with her dental procedure at acceptable risk.  She knows to take antibiotics prior to any dental work given her hx of endocarditis in the past.    Dispo:  No follow-ups on file.   Medication Adjustments/Labs and Tests Ordered: Current medicines are reviewed at length with the patient today.  Concerns regarding medicines are outlined above.  Tests Ordered: No orders of the defined types were placed in this encounter.  Medication Changes: No orders of the defined types were placed in this encounter.   Signed, Richardson Dopp, PA-C  11/29/2019 3:54 PM    [facility] [place], [place], [place]  [address] Phone: [phone]; Fax: [phone]

## 2019-11-30 ENCOUNTER — Other Ambulatory Visit: Payer: Self-pay

## 2019-11-30 ENCOUNTER — Telehealth (INDEPENDENT_AMBULATORY_CARE_PROVIDER_SITE_OTHER): Payer: PPO | Admitting: Physician Assistant

## 2019-11-30 ENCOUNTER — Encounter: Payer: Self-pay | Admitting: Physician Assistant

## 2019-11-30 VITALS — BP 126/70 | HR 105 | Ht 60.0 in | Wt 127.0 lb

## 2019-11-30 DIAGNOSIS — I25119 Atherosclerotic heart disease of native coronary artery with unspecified angina pectoris: Secondary | ICD-10-CM

## 2019-11-30 DIAGNOSIS — I10 Essential (primary) hypertension: Secondary | ICD-10-CM

## 2019-11-30 DIAGNOSIS — R002 Palpitations: Secondary | ICD-10-CM

## 2019-11-30 DIAGNOSIS — I358 Other nonrheumatic aortic valve disorders: Secondary | ICD-10-CM

## 2019-11-30 MED ORDER — METOPROLOL SUCCINATE ER 50 MG PO TB24: 50.0000 mg | ORAL_TABLET | Freq: Every day | ORAL | 1 refills | Status: DC

## 2019-11-30 NOTE — Patient Instructions (Addendum)
Medication Instructions:   Your physician has recommended you make the following change in your medication:   1) Increase Metoprolol Succinate to 50 mg, 1 tablet by mouth once a day  *If you need a refill on your cardiac medications before your next appointment, please call your pharmacy*  Lab Work:  None ordered today  Testing/Procedures:  None ordered today  Follow-Up: At [facility], you and your health needs are our priority.  As part of our continuing mission to provide you with exceptional heart care, we have created designated Provider Care Teams.  These Care Teams include your primary Cardiologist (physician) and Advanced Practice Providers (APPs -  Physician Assistants and Nurse Practitioners) who all work together to provide you with the care you need, when you need it.  We recommend signing up for the patient portal called "MyChart".  Sign up information is provided on this After Visit Summary.  MyChart is used to connect with patients for Virtual Visits (Telemedicine).  Patients are able to view lab/test results, encounter notes, upcoming appointments, etc.  Non-urgent messages can be sent to your provider as well.   To learn more about what you can do with MyChart, go to [email or web address].    Your next appointment:    On 03/01/20 at 2:45PM with Richardson Dopp, PA-C

## 2019-11-30 NOTE — Progress Notes (Signed)
Virtual Visit via Video Note   This visit type was conducted due to national recommendations for restrictions regarding the COVID-19 Pandemic (e.g. social distancing) in an effort to limit this patient's exposure and mitigate transmission in our community.  Due to her co-morbid illnesses, this patient is at least at moderate risk for complications without adequate follow up.  This format is felt to be most appropriate for this patient at this time.  All issues noted in this document were discussed and addressed.  A limited physical exam was performed with this format.  Please refer to the patient's chart for her consent to telehealth for [facility].   The patient was identified using 2 identifiers.  Date:  11/30/2019   ID:  [name], DOB [birth year], MRN [id number]  Patient Location: Home Provider Location: Office  PCP:  Janith Lima, MD  Cardiologist:  Sherren Mocha, MD   Electrophysiologist:  None   Evaluation Performed:  Follow-Up Visit  Chief Complaint: CAD, chest pain  History of Present Illness:    [name] is a 79 y.o. 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  Hypertension   Hyperlipidemia   Diabetes mellitus   Prior CV studies: Myoview 11/12/2019 EF > 65, normal perfusion (no ischemia or infarction)  Echocardiogram 05/01/2019 EF 60-65, Gr 2 DD, normal RVSF, mild LAE, mod MAC, trivial MR, mod AoV sclerosis with mobile structure representing Lambl's excrescence   Echocardiogram 10/31/2018 EF 55-60, mod MAC, mod TR,  calcification of AoV, mild AI, mobile area of calcium in LVOT (small veg vs Lambl's excrescence)  Event Monitor 08/31/18 NSR, Avg HR 72; 1 six beat NSVT, no AFib  TEE 09/26/17 ([place]) Mild LVH, normal LVSF, no RWMA, mild LAE, mobile structure on AoV extends into LVOT c/w SBE, mild AI, mod TR  Carotid US 09/24/17 ([place]) Conclusions: Mild plaque (<50%) is present in the bilateral internal carotid arteries that is not associated with a hemodynamically significant stenosis.  Antegrade flow with a normal hemodynamic profile was present in both vertebral arteries.  Echo ([place]) 09/24/17 Mild conc LVH, normal LVSF, no RWMA, mild LAE, mobile linear structure in LVOT side of AoV (Lambl's excrescence vs vegetation), mod TR, mild pulmonary HTN, mild PI  Cardiac Catheterization 07/15/17 [facility] in [place], [place]) [place] dist 20 LAD prox 50 (FFR 0.94 - no hemodynamically significant) LCx prox 30; OM1 prox 60 RCA prox 20, mid stent patent with 50 ISR, dist 80 at edge of prior stent PCI:  POBA to mid RCA ISR PCI:  2.5 x 12 mm Resolute DES to dist RCA overlapping with prior stent  Echo 07/15/17 [facility] in [place], [place]) Borderline conc LVH, EF 55-60, Gr 1 DD, mild LAE, trivial MR, mild AoV sclerosis, trivial AI, mild to mod TR, RVSP 40-45 (mild pulmo HTN)  Myoview 03/05/12 Overall Impression: Normal stress nuclear study. LV Ejection Fraction: 69%. LV Wall Motion: NL LV Function; NL Wall Motion   History of Present Illness:   She was last seen in clinic  11/10/2019 by [name], NP.  She complained of chest discomfort and was placed on isosorbide.  Nuclear stress test was obtained and demonstrated no ischemia.  Today, she notes that she is doing well.  She has had a couple more episodes of chest discomfort.  She feels the isosorbide has helped her symptoms.  She often feels her heart is racing.  This is with activity.  She has not had syncope,  orthopnea.  She has some mild pedal edema.  She has noted some leg cramps.  She has been busy getting ready to move back to [place] over the past several weeks.  Past Medical History:  Diagnosis Date  . Allergy   . Anxiety   . CAD (coronary artery disease)    s/p inf MI 2007 - tx w/ DES to RCA // Echo 12/08: EF 60%, mild MR, mild LAE // LHC 08/2009: dLM 20%, LAD beyond diagonal 60-70%, stable from prior catheterization in 2007, ostial circumflex 40-50%, mid RCA stent ok, EF 65% // Myoview 08/2009: EF 76%, small inferoapical fixed defect, no ischemia // Myoview 7/13: no scar or ischemia, EF 69% //    . Cataract    removed both eyes  . Cerebrovascular disease, unspecified   . Chronic kidney disease    frequent Kidney Infections  . Chronic neck pain   . Chronic pain syndrome   . Diverticular disease   . Dizziness   . DJD (degenerative joint disease)   . DM type 2 (diabetes mellitus, type 2) ([place])   . Echocardiogram    Echo 10/2018: EF 55-60, normal RVSF, mod MAC, mod TR, severe AoV calcification and sclerosis with nodular calcium/mobile area of calcium in the LVOT (small veg vs Lambl's excrescence  - consider TEE), mild AI, mild AS (mean 11).   . Echocardiogram 04/2019   Echocardiogram 04/2019: EF 60-65, basal septal hypertrophy, grade 2 diastolic dysfunction, normal wall motion, normal RV SF, mild LAE, mod MAC, trivial MR, mod sclerosis of the aortic valve with mod aortic annular calcification, thin mobile filamentous structure on ventricular side of AV likely representing Lambl's excrescence, mild AI, mild TR  . GERD (gastroesophageal reflux disease)   . H/O hiatal hernia   . HTN (hypertension)   . IBS (irritable bowel syndrome)   . 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 Internal hemorrhoids   . Ischemic colitis ([place])   . Mitral valve prolapse   . Mixed hyperlipidemia   . Myocardial infarction ([place]) 2007  . Neuromuscular disorder ([place])    hiatal hernia  . Nocturia     . Pancreatitis    1955 an once more  . PONV (postoperative nausea and vomiting)    Difficluty opening mouth wide and turning head. (Cervical Fusion)  . Premature ventricular contractions   . Tubular adenoma of colon   . Ulcer    sam [facility] [facility]  . Urinary incontinence       Current Meds  Medication Sig  . amLODipine (NORVASC) 5 MG tablet Take 1 tablet (5 mg total) by mouth daily.  . Ascorbic Acid (VITAMIN C) 1000 MG tablet Take 1,000 mg by mouth daily.    Marland Kitchen aspirin EC 81 MG tablet Take 81 mg by mouth every morning.   Marland Kitchen atorvastatin (LIPITOR) 80 MG tablet TAKE 1 TABLET BY MOUTH ONCE DAILY IN THE EVENING  . Cholecalciferol (VITAMIN D3) 1000 UNITS CAPS Take 1 capsule by mouth daily.    . clopidogrel (PLAVIX) 75 MG tablet Take 1 tablet (  75 mg total) by mouth daily.  Marland Kitchen dexlansoprazole (DEXILANT) 60 MG capsule Take 1 capsule (60 mg total) by mouth daily.  Marland Kitchen dicyclomine (BENTYL) 10 MG capsule Take 1 capsule (10 mg total) by mouth 4 (four) times daily -  before meals and at bedtime.  Marland Kitchen ezetimibe (ZETIA) 10 MG tablet Take 1 tablet by mouth once daily  . gabapentin (NEURONTIN) 300 MG capsule Take 2 capsules (600 mg total) by mouth 2 (two) times daily.  . isosorbide mononitrate (IMDUR) 30 MG 24 hr tablet Take 1 tablet (30 mg total) by mouth daily.  . metoprolol succinate (TOPROL-XL) 50 MG 24 hr tablet Take 1 tablet (50 mg total) by mouth daily.  . nitroGLYCERIN (NITROSTAT) 0.4 MG SL tablet Place under the tongue.  Marland Kitchen olmesartan (BENICAR) 40 MG tablet Take 1 tablet by mouth once daily  . Omega-3 Fatty Acids (FISH OIL) 1000 MG CAPS Take 2 capsules by mouth 4 (four) times daily.   Marland Kitchen venlafaxine XR (EFFEXOR-XR) 37.5 MG 24 hr capsule TAKE 1 CAPSULE BY MOUTH ONCE DAILY WITH BREAKFAST  . [DISCONTINUED] metoprolol succinate (TOPROL-XL) 25 MG 24 hr tablet Take 1 tablet (25 mg total) by mouth daily.     Allergies:   Nitrofurantoin, Niacin and related, Ceftriaxone, Codeine, Prednisone, Sulfonamide  derivatives, and Tetracycline   Social History   Tobacco Use  . Smoking status: Never Smoker  . Smokeless tobacco: Never Used  Substance Use Topics  . Alcohol use: No  . Drug use: No     Family Hx: The patient's family history includes Cancer - Other in her brother; Colon polyps in her sister; Coronary artery disease in some other family members; Heart disease in her mother; Pancreatic cancer in her mother. There is no history of Colon cancer, Esophageal cancer, Rectal cancer, or Stomach cancer.  ROS:   Please see the history of present illness.    She has not had melena, hematochezia, hematuria. All other systems reviewed and are negative.     Labs/Other Tests and Data Reviewed:    EKG:  No ECG reviewed.  Recent Labs: 09/29/2019: ALT 18; BUN 16; Creatinine, Ser 0.83; Hemoglobin 13.2; Platelets 301.0; Potassium 4.0; Sodium 136   Recent Lipid Panel Lab Results  Component Value Date/Time   CHOL 94 04/30/2019 11:34 AM   TRIG 85.0 04/30/2019 11:34 AM   TRIG 164 (H) 06/24/2006 07:38 AM   HDL 50.20 04/30/2019 11:34 AM   CHOLHDL 2 04/30/2019 11:34 AM   LDLCALC 26 04/30/2019 11:34 AM   LDLDIRECT 68.0 03/10/2015 03:30 PM    Wt Readings from Last 3 Encounters:  11/30/19 127 lb (57.6 kg)  11/10/19 127 lb 6.4 oz (57.8 kg)  09/29/19 125 lb (56.7 kg)     Objective:    Vital Signs:  BP 126/70   Pulse (!) 105   Ht 5' (1.524 m)   Wt 127 lb (57.6 kg)   BMI 24.80 kg/m    VITAL SIGNS:  reviewed GEN:  no acute distress EYES:  sclerae anicteric, EOMI - Extraocular Movements Intact RESPIRATORY:  Normal respiratory effort NEURO:  alert and oriented x 3, no obvious focal deficit PSYCH:  normal affect  ASSESSMENT & PLAN:    1. Coronary artery disease involving native coronary artery of native heart with angina pectoris ([place]) Hx of inferior MI in 2007 tx with DES to the RCA. She underwent POBA to the mid RCA stent and DES to the distal RCA (overlapping with the prior stent) in  07/2017 in [place].  She was recently seen for chest discomfort.  Her symptoms are somewhat atypical for ischemia.  However, she has had improved symptoms with isosorbide.  Nuclear stress test was low risk and negative for ischemia.  She also notes her heart racing at times.  This tends to occur with activity.  Therefore, I have suggested that we increase her metoprolol succinate to 50 mg daily.  Continue current dose of amlodipine, aspirin, atorvastatin, clopidogrel, ezetimibe, isosorbide, olmesartan.  FU in 3 mos.  If symptoms worsen, we will need to see her sooner and consider cardiac catheterization.    2. Essential hypertension Blood pressure is well controlled.  Continue current therapy.  3. 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  She was treated for endocarditis in 2019.  Echocardiogram in February 2020 in August 2020 demonstrated no change.  4. Palpitations She notes elevated heart rates with activity.  She had a monitor in December 2019 that demonstrated no atrial fibrillation.  Adjust metoprolol succinate to 50 mg daily as noted above.  I have advised her to contact us if she continues to have palpitations.  At that point, we could repeat her event monitor.   Time:   Today, I have spent 14 minutes with the patient with telehealth technology discussing the above problems.     Medication Adjustments/Labs and Tests Ordered: Current medicines are reviewed at length with the patient today.  Concerns regarding medicines are outlined above.   Tests Ordered: No orders of the defined types were placed in this encounter.   Medication Changes: Meds ordered this encounter  Medications  . metoprolol succinate (TOPROL-XL) 50 MG 24 hr tablet    Sig: Take 1 tablet (50 mg total) by mouth daily.    Dispense:  90 tablet    Refill:  1    Follow Up:  In Person in 3 month(s)  Signed, Richardson Dopp, PA-C  11/30/2019 11:44 AM    [place]

## 2019-12-01 ENCOUNTER — Other Ambulatory Visit: Payer: Self-pay | Admitting: Internal Medicine

## 2019-12-07 ENCOUNTER — Other Ambulatory Visit: Payer: Self-pay

## 2019-12-07 ENCOUNTER — Telehealth: Payer: Self-pay

## 2019-12-07 ENCOUNTER — Ambulatory Visit: Payer: PPO | Admitting: Pharmacist

## 2019-12-07 DIAGNOSIS — E118 Type 2 diabetes mellitus with unspecified complications: Secondary | ICD-10-CM

## 2019-12-07 DIAGNOSIS — K2100 Gastro-esophageal reflux disease with esophagitis, without bleeding: Secondary | ICD-10-CM

## 2019-12-07 DIAGNOSIS — M542 Cervicalgia: Secondary | ICD-10-CM

## 2019-12-07 DIAGNOSIS — I2510 Atherosclerotic heart disease of native coronary artery without angina pectoris: Secondary | ICD-10-CM

## 2019-12-07 DIAGNOSIS — I10 Essential (primary) hypertension: Secondary | ICD-10-CM

## 2019-12-07 DIAGNOSIS — E785 Hyperlipidemia, unspecified: Secondary | ICD-10-CM

## 2019-12-07 MED ORDER — DICYCLOMINE HCL 10 MG PO CAPS: 10.0000 mg | ORAL_CAPSULE | Freq: Three times a day (TID) | ORAL | 0 refills | Status: DC

## 2019-12-07 MED ORDER — GABAPENTIN 300 MG PO CAPS: 600.0000 mg | ORAL_CAPSULE | Freq: Two times a day (BID) | ORAL | 0 refills | Status: DC

## 2019-12-07 MED ORDER — EZETIMIBE 10 MG PO TABS: 10.0000 mg | ORAL_TABLET | Freq: Every day | ORAL | 0 refills | Status: DC

## 2019-12-07 MED ORDER — ATORVASTATIN CALCIUM 80 MG PO TABS: 80.0000 mg | ORAL_TABLET | Freq: Every evening | ORAL | 0 refills | Status: DC

## 2019-12-07 MED ORDER — CLOPIDOGREL BISULFATE 75 MG PO TABS: 75.0000 mg | ORAL_TABLET | Freq: Every day | ORAL | 0 refills | Status: DC

## 2019-12-07 MED ORDER — DEXILANT 60 MG PO CPDR: 1.0000 | DELAYED_RELEASE_CAPSULE | Freq: Every day | ORAL | 0 refills | Status: DC

## 2019-12-07 MED ORDER — OLMESARTAN MEDOXOMIL 40 MG PO TABS: 40.0000 mg | ORAL_TABLET | Freq: Every day | ORAL | 0 refills | Status: DC

## 2019-12-07 MED ORDER — AMLODIPINE BESYLATE 5 MG PO TABS: 5.0000 mg | ORAL_TABLET | Freq: Every day | ORAL | 1 refills | Status: DC

## 2019-12-07 MED ORDER — VENLAFAXINE HCL ER 37.5 MG PO CP24: ORAL_CAPSULE | ORAL | 0 refills | Status: DC

## 2019-12-07 NOTE — Patient Instructions (Addendum)
Visit Information  Thank you for meeting with me to discuss your medications! I look forward to working with you to achieve your health care goals. Below is a summary of what we talked about during the visit:  Goals Addressed            This Visit's Progress   . Pharmacy Care Plan       CARE PLAN ENTRY  Current Barriers:  . Chronic Disease Management support, education, and care coordination needs related to HTN, HLD, and DMII  Pharmacist Clinical Goal(s):  Marland Kitchen Maintain BP between 110/60 and 130/80 . Maintain LDL < 70 . Maintain A1c < 7.0% . Ensure safety, efficacy, and affordability of medications . Improve ease of medication administration  Interventions: . Comprehensive medication review performed. . Discussed management of diabetes through diet and exercise o If A1c is elevated at next check, plan to restart metformin . Pursue tier exception for Dexilant, if denied we will apply for patient assistance . Utilize [facility] for medication synchronization, packaging and delivery  Patient Self Care Activities:  . Self administers medications as prescribed, Calls pharmacy for medication refills, and Calls provider office for new concerns or questions  Initial goal documentation       [name] [name] was given information about Chronic Care Management services today including:  1. CCM service includes personalized support from designated clinical staff supervised by her physician, including individualized plan of care and coordination with other care providers 2. 24/7 contact phone numbers for assistance for urgent and routine care needs. 3. Standard insurance, coinsurance, copays and deductibles apply for chronic care management only during months in which we provide at least 20 minutes of these services. Most insurances cover these services at 100%, however patients may be responsible for any copay, coinsurance and/or deductible if applicable. This service may help you avoid  the need for more expensive face-to-face services. 4. Only one practitioner may furnish and bill the service in a calendar month. 5. The patient may stop CCM services at any time (effective at the end of the month) by phone call to the office staff.  Patient agreed to services and verbal consent obtained.   The patient verbalized understanding of instructions provided today and declined a print copy of patient instruction materials.  Telephone follow up appointment with pharmacy team member scheduled for: 3 months  Charlene Brooke, PharmD Clinical Pharmacist [facility] [phone]    Diabetes Mellitus and Nutrition, Adult When you have diabetes (diabetes mellitus), it is very important to have healthy eating habits because your blood sugar (glucose) levels are greatly affected by what you eat and drink. Eating healthy foods in the appropriate amounts, at about the same times every day, can help you:  Control your blood glucose.  Lower your risk of heart disease.  Improve your blood pressure.  Reach or maintain a healthy weight. Every person with diabetes is different, and each person has different needs for a meal plan. Your health care provider may recommend that you work with a diet and nutrition specialist (dietitian) to make a meal plan that is best for you. Your meal plan may vary depending on factors such as:  The calories you need.  The medicines you take.  Your weight.  Your blood glucose, blood pressure, and cholesterol levels.  Your activity level.  Other health conditions you have, such as heart or kidney disease. How do carbohydrates affect me? Carbohydrates, also called carbs, affect your blood glucose level  more than any other type of food. Eating carbs naturally raises the amount of glucose in your blood. Carb counting is a method for keeping track of how many carbs you eat. Counting carbs is important to keep your blood glucose at a  healthy level, especially if you use insulin or take certain oral diabetes medicines. It is important to know how many carbs you can safely have in each meal. This is different for every person. Your dietitian can help you calculate how many carbs you should have at each meal and for each snack. Foods that contain carbs include:  Bread, cereal, rice, pasta, and crackers.  Potatoes and corn.  Peas, beans, and lentils.  Milk and yogurt.  Fruit and juice.  Desserts, such as cakes, cookies, ice cream, and candy. How does alcohol affect me? Alcohol can cause a sudden decrease in blood glucose (hypoglycemia), especially if you use insulin or take certain oral diabetes medicines. Hypoglycemia can be a life-threatening condition. Symptoms of hypoglycemia (sleepiness, dizziness, and confusion) are similar to symptoms of having too much alcohol. If your health care provider says that alcohol is safe for you, follow these guidelines:  Limit alcohol intake to no more than 1 drink per day for nonpregnant women and 2 drinks per day for men. One drink equals 12 oz of beer, 5 oz of wine, or 1 oz of hard liquor.  Do not drink on an empty stomach.  Keep yourself hydrated with water, diet soda, or unsweetened iced tea.  Keep in mind that regular soda, juice, and other mixers may contain a lot of sugar and must be counted as carbs. What are tips for following this plan?  Reading food labels  Start by checking the serving size on the "Nutrition Facts" label of packaged foods and drinks. The amount of calories, carbs, fats, and other nutrients listed on the label is based on one serving of the item. Many items contain more than one serving per package.  Check the total grams (g) of carbs in one serving. You can calculate the number of servings of carbs in one serving by dividing the total carbs by 15. For example, if a food has 30 g of total carbs, it would be equal to 2 servings of carbs.  Check the  number of grams (g) of saturated and trans fats in one serving. Choose foods that have low or no amount of these fats.  Check the number of milligrams (mg) of salt (sodium) in one serving. Most people should limit total sodium intake to less than 2,300 mg per day.  Always check the nutrition information of foods labeled as "low-fat" or "nonfat". These foods may be higher in added sugar or refined carbs and should be avoided.  Talk to your dietitian to identify your daily goals for nutrients listed on the label. Shopping  Avoid buying canned, premade, or processed foods. These foods tend to be high in fat, sodium, and added sugar.  Shop around the outside edge of the grocery store. This includes fresh fruits and vegetables, bulk grains, fresh meats, and fresh dairy. Cooking  Use low-heat cooking methods, such as baking, instead of high-heat cooking methods like deep frying.  Cook using healthy oils, such as olive, canola, or sunflower oil.  Avoid cooking with butter, cream, or high-fat meats. Meal planning  Eat meals and snacks regularly, preferably at the same times every day. Avoid going long periods of time without eating.  Eat foods high in fiber, such as  fresh fruits, vegetables, beans, and whole grains. Talk to your dietitian about how many servings of carbs you can eat at each meal.  Eat 4-6 ounces (oz) of lean protein each day, such as lean meat, chicken, fish, eggs, or tofu. One oz of lean protein is equal to: ? 1 oz of meat, chicken, or fish. ? 1 egg. ?  cup of tofu.  Eat some foods each day that contain healthy fats, such as avocado, nuts, seeds, and fish. Lifestyle  Check your blood glucose regularly.  Exercise regularly as told by your health care provider. This may include: ? 150 minutes of moderate-intensity or vigorous-intensity exercise each week. This could be brisk walking, biking, or water aerobics. ? Stretching and doing strength exercises, such as yoga or  weightlifting, at least 2 times a week.  Take medicines as told by your health care provider.  Do not use any products that contain nicotine or tobacco, such as cigarettes and e-cigarettes. If you need help quitting, ask your health care provider.  Work with a Social worker or diabetes educator to identify strategies to manage stress and any emotional and social challenges. Questions to ask a health care provider  Do I need to meet with a diabetes educator?  Do I need to meet with a dietitian?  What number can I call if I have questions?  When are the best times to check my blood glucose? Where to find more information:  American Diabetes Association: diabetes.org  Academy of Nutrition and Dietetics: www.eatright.[employer] of Diabetes and Digestive and Kidney Diseases (NIH): [employer] Summary  A healthy meal plan will help you control your blood glucose and maintain a healthy lifestyle.  Working with a diet and nutrition specialist (dietitian) can help you make a meal plan that is best for you.  Keep in mind that carbohydrates (carbs) and alcohol have immediate effects on your blood glucose levels. It is important to count carbs and to use alcohol carefully. This information is not intended to replace advice given to you by your health care provider. Make sure you discuss any questions you have with your health care provider. Document Revised: 08/02/2017 Document Reviewed: 09/24/2016 Elsevier Patient Education  2020 [employer].

## 2019-12-07 NOTE — Telephone Encounter (Signed)
-----   Message from Charlton Haws, [facility] sent at 12/07/2019  1:10 PM EDT ----- Regarding: Med refills Patient switching to [facility], can you order her refills to the pharmacy?  Amlodipine 5 mg Atorvastatin 80 mg Clopidogrel 75 mg Dexilant 60 mg Dicyclomine 10 mg Ezetimibe 10 mg  Gabapentin 600 mg Olmesartan 40 mg Venlafaxine 37.5 mg

## 2019-12-07 NOTE — Chronic Care Management (AMB) (Signed)
Chronic Care Management Pharmacy  Name: [name] [name]  MRN: [id number] DOB: 03/21/1941   Chief Complaint/ HPI  [name] [name],  79 y.o. , female presents for their Initial CCM visit with the clinical pharmacist via telephone due to COVID-19 Pandemic.  PCP : Janith Lima, MD  Their chronic conditions include: HTN, CAD (MI 2007), T2DM, GERD, HLD, depression/anxiety, DJD   Moving in with daughter this month, daughter is a Marine scientist. Lives next door to other daughter now - son in law retired as Theme park manager yesterday.   Office Visits: 09/29/19 Dr Ronnald Ramp OV: wound check for abscess on RLE, resolving. No med changes.   09/23/19 Dr Ronnald Ramp OV: blunt trauma to RLE, I&D of hematoma in office.  04/30/19 Dr Ronnald Ramp OV: labs stable, no med changes.  Consult Visit: 11/30/19 PA Kathlen Mody (cardiology): recent chest pain, Myoview negative ischemia, pt c/o palpitations. Increased metoprolol to 50 mg.   05/20/19 Dr Hilarie Fredrickson (GI): internal hemorrhoids - rec'd OTC prep H w/ hydrocortisone suppositories x 5 nights.   Medications: Outpatient Encounter Medications as of 12/07/2019  Medication Sig  . amLODipine (NORVASC) 5 MG tablet Take 1 tablet (5 mg total) by mouth daily.  . Ascorbic Acid (VITAMIN C) 1000 MG tablet Take 1,000 mg by mouth daily.    Marland Kitchen aspirin EC 81 MG tablet Take 81 mg by mouth every morning.   Marland Kitchen atorvastatin (LIPITOR) 80 MG tablet TAKE 1 TABLET BY MOUTH ONCE DAILY IN THE EVENING  . Cholecalciferol (VITAMIN D3) 1000 UNITS CAPS Take 1 capsule by mouth daily.    . clopidogrel (PLAVIX) 75 MG tablet Take 1 tablet by mouth once daily  . dexlansoprazole (DEXILANT) 60 MG capsule Take 1 capsule (60 mg total) by mouth daily.  Marland Kitchen dicyclomine (BENTYL) 10 MG capsule Take 1 capsule (10 mg total) by mouth 4 (four) times daily -  before meals and at bedtime.  Marland Kitchen ezetimibe (ZETIA) 10 MG tablet Take 1 tablet by mouth once daily  . gabapentin (NEURONTIN) 300 MG capsule Take 2 capsules (600 mg total) by mouth 2 (two)  times daily.  . isosorbide mononitrate (IMDUR) 30 MG 24 hr tablet Take 1 tablet (30 mg total) by mouth daily.  . metoprolol succinate (TOPROL-XL) 50 MG 24 hr tablet Take 1 tablet (50 mg total) by mouth daily.  . nitroGLYCERIN (NITROSTAT) 0.4 MG SL tablet Place under the tongue.  Marland Kitchen olmesartan (BENICAR) 40 MG tablet Take 1 tablet by mouth once daily  . Omega-3 Fatty Acids (FISH OIL) 1000 MG CAPS Take 2 capsules by mouth 4 (four) times daily.   Marland Kitchen venlafaxine XR (EFFEXOR-XR) 37.5 MG 24 hr capsule TAKE 1 CAPSULE BY MOUTH ONCE DAILY WITH BREAKFAST  . metFORMIN (GLUCOPHAGE) 500 MG tablet Take 1 tablet (500 mg total) by mouth 2 (two) times daily with a meal. (Patient not taking: Reported on 11/30/2019)   No facility-administered encounter medications on file as of 12/07/2019.     Current Diagnosis/Assessment:  SDOH Interventions     Most Recent Value  SDOH Interventions  Physical Activity Interventions  Other (Comments) [encouraged pt to walk outside as weather allows]      Goals Addressed            This Visit's Progress   . Pharmacy Care Plan       CARE PLAN ENTRY  Current Barriers:  . Chronic Disease Management support, education, and care coordination needs related to HTN, HLD, and DMII  Pharmacist Clinical Goal(s):  Marland Kitchen Maintain BP between  110/60 and 130/80 . Maintain LDL < 70 . Maintain A1c < 7.0% . Ensure safety, efficacy, and affordability of medications . Improve ease of medication administration  Interventions: . Comprehensive medication review performed. . Discussed management of diabetes through diet and exercise o If A1c is elevated at next check, plan to restart metformin . Pursue tier exception for Dexilant, if denied we will apply for patient assistance . Utilize [facility] pharmacy for medication synchronization, packaging and delivery  Patient Self Care Activities:  . Self administers medications as prescribed, Calls pharmacy for medication refills, and Calls  provider office for new concerns or questions  Initial goal documentation       Diabetes   Recent Relevant Labs: Lab Results  Component Value Date/Time   HGBA1C 6.6 (H) 09/29/2019 02:36 PM   HGBA1C 6.5 04/30/2019 11:34 AM   MICROALBUR 2.6 (H) 07/15/2018 10:24 AM   MICROALBUR 1.0 10/20/2015 11:47 AM    Lab Results  Component Value Date   CREATININE 0.83 09/29/2019   CREATININE 0.83 04/30/2019   CREATININE 0.81 10/28/2018    Checking BG: Never  Patient has failed these meds in past: glimepiride, metformin Patient is currently controlled on the following medications: no meds  Last diabetic eye exam:  Lab Results  Component Value Date/Time   HMDIABEYEEXA No Retinopathy 04/27/2019 12:00 AM    Last diabetic foot exam:  Lab Results  Component Value Date/Time   HMDIABFOOTEX done 02/10/2014 12:00 AM     We discussed: diet and exercise extensively. Pt has not been taking metformin since December due to Rx running out. Per chart Rx was sent 07/20/19 for 6 month supply, however it appears pharmacy has not filled and pt was not sure if she was still supposed to be taking it. Her A1c in January was 6.6, representing about 1.5 months off metformin. Discussed A1c goals, pt agreed if A1c is elevated at next check she will restart metformin.  Plan  Continue control with diet and exercise  Continue to hold metformin - if A1c elevated at next check plan to restart   Hypertension   Office blood pressures are  BP Readings from Last 3 Encounters:  11/30/19 126/70  11/10/19 138/72  09/29/19 (!) 150/60    Patient has failed these meds in the past: n/a Patient is currently controlled on the following medications: amlodipine 5 mg daily, metoprolol succinate 50 mg daily, olmesartan 40 mg daily, isosorbide MN 30 mg daily  Patient checks BP at home 1-2x per week  Patient home BP readings are ranging: 120/70 - 138/70s  We discussed diet and exercise extensively. Pt reports headaches  with isosorbide, Tylenol helps. She reports she had a dizzy spell on Saturday, had a fall last year due to dizziness. She does not check her BP when she feels this way. Counseled on dizziness as a symptom of low BP, pt will check BP if she feels this way again, emphasized importance of hydration and good PO intake to prevent low BP.   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 Myoview 3/21: no ischemia present.  Patient has failed these meds in past: n/a Patient is currently controlled on the following medications: atorvastatin 80 mg daily HS, ezetimibe 10 mg daily, nitroglycerin 0.4 mg SL prn, aspirin 81 mg daily, clopidogrel 75 mg daily, fish oil OTC   We discussed:  diet and exercise extensively, cholesterol goals, role of cholesterol in ASCVD. Pt denies side effects. Since starting isosorbide she has not had to use NTG. She endorses easy bruising with clopiogrel/aspirin but denies bleeding.  Plan  Continue current medications and control with diet and exercise    GERD/IBS   Patient has failed these meds in past: omeprazole  Patient is currently controlled on the following medications: dexlansoprazole 60 mg daily, dicyclomine 10 mg QID prn  We discussed:  Pt was switched from omeprazole to Dexilant due to omeprazole wearing off in the PM. Dexilant is $30/month and pt wishes to pursue tier exception or pt assistance. She is taking a fiber supplement along with dicyclomine to manage IBS-C.   Plan  Continue current medications  Pursue tier exception for Dexilant   Depression/Anxiety     Patient has failed these meds in past: n/a Patient is currently controlled on the following medications: venlfaxine ER 37.5 mg daily  We discussed:  Pt reports symptoms are controlled with venlafaxine, denies issues.  Plan  Continue current medications    Neuropathy   Patient has failed these meds in past: n/a Patient is currently controlled on the following medications: gabapentin 600 mg (300 mg x 2) BID  We discussed:  Pt reports some increased pain in neck during packing/moving process, generally gabapentin controls pain well. Emphasized importance of pacing herself and taking breaks while packing.  Plan  Continue current medications    Health Maintenance   Patient is currently controlled on the following medications: Vitamin C, Vitamin D3 1000 IU,  We discussed: Patient is satisfied with current OTC regimen and denies issues  Plan  Continue current medications    Medication Management   Pt uses [facility] for all medications Does not use pill box - separates AM and PM in dixie cups Pt endorses 100% compliance  We discussed: Verbal consent obtained for [facility] enhanced pharmacy services (medication synchronization, adherence packaging, delivery coordination). A medication sync plan was created to allow patient to get all medications delivered once every 30 to 90 days per patient preference. Patient understands they have freedom to choose pharmacy and clinical pharmacist will coordinate care between all prescribers and [facility].  [address] [place] [address]  Plan  Utilize [facility] pharmacy for medication synchronization, packaging and delivery     Follow up: 1 week call to f/u on [place], PharmD Clinical Pharmacist [facility] [phone]

## 2019-12-08 ENCOUNTER — Other Ambulatory Visit: Payer: Self-pay

## 2019-12-08 DIAGNOSIS — I10 Essential (primary) hypertension: Secondary | ICD-10-CM

## 2019-12-08 DIAGNOSIS — I25119 Atherosclerotic heart disease of native coronary artery with unspecified angina pectoris: Secondary | ICD-10-CM

## 2019-12-08 MED ORDER — METOPROLOL SUCCINATE ER 50 MG PO TB24: 50.0000 mg | ORAL_TABLET | Freq: Every day | ORAL | 3 refills | Status: DC

## 2019-12-08 MED ORDER — ISOSORBIDE MONONITRATE ER 30 MG PO TB24: 30.0000 mg | ORAL_TABLET | Freq: Every day | ORAL | 3 refills | Status: DC

## 2019-12-08 NOTE — Progress Notes (Signed)
I have collaborated with the care management provider regarding care management and care coordination activities outlined in this encounter and have reviewed this encounter including documentation in the note and care plan. I am certifying that I agree with the content of this note and encounter as supervising physician.  

## 2019-12-08 NOTE — Telephone Encounter (Signed)
Pt's medications was sent to pt's pharmacy as requested. Confirmation received.  

## 2019-12-12 NOTE — Addendum Note (Signed)
Addended by: Karle Barr on: 12/12/2019 07:03 AM   Modules accepted: Orders

## 2019-12-12 NOTE — Telephone Encounter (Signed)
I have collaborated with the care management provider regarding care management and care coordination activities outlined in this encounter and have reviewed this encounter including documentation in the note and care plan. I am certifying that I agree with the content of this note and encounter as supervising physician.  

## 2020-02-24 ENCOUNTER — Telehealth: Payer: Self-pay

## 2020-02-24 NOTE — Telephone Encounter (Signed)
New message    Daughter Levada Dy is asking for MRI before appt 7/1  Due to recent concern of dementia or mini CVA.   Daughter Levada Dy saying something is not right.

## 2020-02-25 NOTE — Telephone Encounter (Signed)
No referral for MRI is entered for this patient.And we don't know if we can get her scheduled before July 1. Most placed are scheduling a week and half or more out.

## 2020-02-25 NOTE — Telephone Encounter (Signed)
Pt dtr (Angie) contacted. Angie stated that no order for MRI has been put in, she wanted the MRI done prior to appt on July 1st so the results can be reviewed.   Informed dtr that insurance would not pay for the MRI without the notes from provider stating necessity.   Angie stated understanding. She will be with pt at the July 1st appointment.

## 2020-02-29 NOTE — H&P (View-Only) (Signed)
Cardiology Office Note:    Date:  03/01/2020   ID:  [name] [name], DOB [birth year], MRN [id number]  PCP:  [name] [place]ima, MD  Cardiologist:  [name] Mocha, MD   Electrophysiologist:  None   Referring MD: [name] Lima, MD   Chief Complaint:  Follow-up (CAD)    Patient Profile:    [name] [name] is a 79 y.o. female with:   Coronary artery disease  ? S/p Inf MI in 2007 >> PCI: DES to RCA ? S/p POBA to [name] [name] and DES to dRCA in 07/2017 ([name] in [place], [place])  Mid LAD mod dz >> neg by FFR ? Myoview 11/2019: no ischemia   Bacterial endocarditis  ? Admitted to [name] in [place], [place]  Rx with IV antibiotics x 3 weeks ? Echocardiogram 10/2018: EF 55-60, AoV calcification (?Lambl's Excrescence) ? 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  Hypertension   Hyperlipidemia   Diabetes mellitus   Prior CV studies: Myoview 11/12/2019 EF > 65, normal perfusion (no ischemia or infarction)  Echocardiogram 05/01/2019 EF 60-65, Gr 2 DD, normal RVSF, mild LAE, mod MAC, trivial MR, mod AoV sclerosis with mobile structure representing Lambl's excrescence   Echocardiogram 10/31/2018 EF 55-60, mod MAC, mod TR, calcification of AoV, mild AI, mobile area of calcium in LVOT (small veg vs Lambl's excrescence)  Event Monitor 08/31/18 NSR, Avg HR 72; 1 six beat NSVT, no AFib  TEE 09/26/17 ([name] [name]) Mild LVH, normal LVSF, no RWMA, mild LAE, mobile structure on AoV extends into LVOT c/w SBE, mild AI, mod TR  Carotid US 09/24/17 ([name]) Conclusions: Mild plaque (<50%) is present in the bilateral internal carotid arteries that is not associated with a hemodynamically significant stenosis.  Antegrade flow with a normal hemodynamic profile was present in both vertebral arteries.  Echo ([name]) 09/24/17 Mild conc LVH, normal LVSF, no RWMA, mild LAE, mobile linear structure in LVOT side of AoV (Lambl's excrescence vs  vegetation), mod TR, mild pulmonary HTN, mild PI  Cardiac Catheterization11/12/18 [name] [name], [place]) [place] dist 20 LAD prox 50 (FFR 0.94 - no hemodynamically significant) LCx prox 30; OM1 prox 60 RCA prox 20, mid stent patent with 50 ISR, dist 80 at edge of prior stent PCI: POBA to mid RCA ISR PCI: 2.5 x 12 mm Resolute DES to dist RCA overlapping with prior stent  Echo 07/15/17 [name] in [place], [place]) Borderline conc LVH, EF 55-60, Gr 1 DD, mild LAE, trivial MR, mild AoV sclerosis, trivial AI, mild to mod TR, RVSP 40-45 (mild pulmo HTN)  Myoview 03/05/12 Overall Impression: Normal stress nuclear study. LV Ejection Fraction: 69%. LV Wall Motion: NL LV Function; NL Wall Motion  History of Present Illness:    [name] was last seen in [name] in 11/2019.  She noted improved chest discomfort on long acting nitrates.  She had symptoms of palpitations and I increased her beta-blocker dose.  She returns for follow up.  She is here today with her daughter.  Since last seen, she has continued to have episodes of angina.  She describes left-sided chest discomfort that is sharp and tight.  She has not had any radiating symptoms.  She has had associated nausea and shortness of breath.  She has not had syncope, orthopnea or lower extremity swelling.  She usually takes nitroglycerin with prompt relief.  She has had symptoms at rest as well as with some activities.  Her symptoms are reminiscent of her previous angina.  She had not had symptoms like this since her PCI in 2018 until about 6-12 months ago.  Past Medical History:  Diagnosis Date  . Allergy   . Anxiety   . CAD (coronary artery disease)    s/p inf MI 2007 - tx w/ DES to RCA // Echo 12/08: EF 60%, mild MR, mild LAE // LHC 08/2009: dLM 20%, LAD beyond diagonal 60-70%, stable from prior catheterization in 2007, ostial circumflex 40-50%, mid RCA stent ok, EF 65% // Myoview 08/2009: EF 76%, small inferoapical fixed  defect, no ischemia // Myoview 7/13: no scar or ischemia, EF 69% //    . Cataract    removed both eyes  . Cerebrovascular disease, unspecified   . Chronic kidney disease    frequent Kidney Infections  . Chronic neck pain   . Chronic pain syndrome   . Diverticular disease   . Dizziness   . DJD (degenerative joint disease)   . DM type 2 (diabetes mellitus, type 2) ([name])   . Echocardiogram    Echo 10/2018: EF 55-60, normal RVSF, mod MAC, mod TR, severe AoV calcification and sclerosis with nodular calcium/mobile area of calcium in the LVOT (small veg vs Lambl's excrescence  - consider TEE), mild AI, mild AS (mean 11).   . Echocardiogram 04/2019   Echocardiogram 04/2019: EF 60-65, basal septal hypertrophy, grade 2 diastolic dysfunction, normal wall motion, normal RV SF, mild LAE, mod MAC, trivial MR, mod sclerosis of the aortic valve with mod aortic annular calcification, thin mobile filamentous structure on ventricular side of AV likely representing Lambl's excrescence, mild AI, mild TR  . GERD (gastroesophageal reflux disease)   . H/O hiatal hernia   . HTN (hypertension)   . IBS (irritable bowel syndrome)   . 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 Internal hemorrhoids   . Ischemic colitis ([name])   . Mitral valve prolapse   . Mixed hyperlipidemia   . Myocardial infarction ([name]) 2007  . Neuromuscular disorder ([name])    hiatal hernia  . Nocturia   . Pancreatitis    1955 an once more  . PONV (postoperative nausea and vomiting)    Difficluty opening mouth wide and turning head. (Cervical Fusion)  . Premature ventricular contractions   . Tubular adenoma of colon   . Ulcer    [name] [name]  . Urinary incontinence     Current Medications: Current Meds  Medication Sig  . amLODipine (NORVASC) 5 MG tablet Take 1 tablet (5 mg total) by mouth daily.  . Ascorbic Acid (VITAMIN C) 1000 MG tablet Take 1,000 mg by mouth daily.    Marland Kitchen aspirin EC 81 MG tablet Take 81 mg by mouth every  morning.   Marland Kitchen atorvastatin (LIPITOR) 80 MG tablet TAKE ONE TABLET BY MOUTH EVERY EVENING  . Cholecalciferol (VITAMIN D3) 1000 UNITS CAPS Take 1 capsule by mouth daily.    . clopidogrel (PLAVIX) 75 MG tablet Take 1 tablet (75 mg total) by mouth daily.  Marland Kitchen DEXILANT 60 MG capsule TAKE ONE CAPSULE BY MOUTH EVERY MORNING  . dicyclomine (BENTYL) 10 MG capsule TAKE ONE CAPSULE BY MOUTH FOUR TIMES DAILY BEFORE MEALS AND AT BEDTIME  . ezetimibe (ZETIA) 10 MG tablet TAKE ONE TABLET BY MOUTH EVERY MORNING  . gabapentin (NEURONTIN) 300 MG capsule TAKE TWO CAPSULES BY MOUTH TWICE DAILY  . metoprolol succinate (TOPROL-XL) 50 MG 24 hr tablet Take 1 tablet (50 mg total) by mouth daily.  . nitroGLYCERIN (NITROSTAT) 0.4 MG SL  tablet Place under the tongue.  Marland Kitchen olmesartan (BENICAR) 40 MG tablet TAKE ONE TABLET BY MOUTH ONCE DAILY  . Omega-3 Fatty Acids (FISH OIL) 1000 MG CAPS Take 2 capsules by mouth 4 (four) times daily.   Marland Kitchen venlafaxine XR (EFFEXOR-XR) 37.5 MG 24 hr capsule TAKE ONE CAPSULE BY MOUTH EVERY MORNING WITH BREAKFAST  . [DISCONTINUED] isosorbide mononitrate (IMDUR) 30 MG 24 hr tablet Take 1 tablet (30 mg total) by mouth daily.     Allergies:   Nitrofurantoin, Niacin and related, Ceftriaxone, Codeine, Prednisone, Sulfonamide derivatives, and Tetracycline   Social History   Tobacco Use  . Smoking status: Never Smoker  . Smokeless tobacco: Never Used  Vaping Use  . Vaping Use: Never used  Substance Use Topics  . Alcohol use: No  . Drug use: No     Family Hx: The patient's family history includes Cancer - Other in her brother; Colon polyps in her sister; Coronary artery disease in some other family members; Heart disease in her mother; Pancreatic cancer in her mother. There is no history of Colon cancer, Esophageal cancer, Rectal cancer, or Stomach cancer.  Review of Systems  Constitutional: Negative for chills and fever.  Respiratory: Negative for cough.   Gastrointestinal: Negative for  hematochezia and melena.  Genitourinary: Negative for hematuria.  All other systems reviewed and are negative.    EKGs/Labs/Other Test Reviewed:    EKG:  EKG is   ordered today.  The ekg ordered today demonstrates normal sinus rhythm, heart rate 63, left axis deviation, no ST-T wave changes, QTC 421  Recent Labs: 09/29/2019: ALT 18; BUN 16; Creatinine, Ser 0.83; Hemoglobin 13.2; Platelets 301.0; Potassium 4.0; Sodium 136   Recent Lipid Panel Lab Results  Component Value Date/Time   CHOL 94 04/30/2019 11:34 AM   TRIG 85.0 04/30/2019 11:34 AM   TRIG 164 (H) 06/24/2006 07:38 AM   HDL 50.20 04/30/2019 11:34 AM   CHOLHDL 2 04/30/2019 11:34 AM   LDLCALC 26 04/30/2019 11:34 AM   LDLDIRECT 68.0 03/10/2015 03:30 PM    Physical Exam:    VS:  BP 140/66   Pulse 68   Ht 5' (1.524 m)   Wt 127 lb (57.6 kg)   BMI 24.80 kg/m     Wt Readings from Last 3 Encounters:  03/01/20 127 lb (57.6 kg)  11/30/19 127 lb (57.6 kg)  11/10/19 127 lb 6.4 oz (57.8 kg)     Constitutional:      Appearance: Healthy appearance. Not in distress.  Neck:     Thyroid: No thyromegaly.     Vascular: JVD normal.     Lymphadenopathy: No cervical adenopathy.  Pulmonary:     Effort: Pulmonary effort is normal.     Breath sounds: No wheezing. No rales.  Cardiovascular:     Normal rate. Regular rhythm. Normal S1. Normal S2.     Murmurs: There is no murmur.  Pulses:    Intact distal pulses.  Edema:    Feet: bilateral trace edema of the feet. Abdominal:     Palpations: Abdomen is soft. There is no hepatomegaly.  Skin:    General: Skin is warm and dry.  Neurological:     Mental Status: Alert and oriented to person, place and time.     Cranial Nerves: Cranial nerves are intact.  Psychiatric:        Mood and Affect: Affect normal.       ASSESSMENT & PLAN:    1. Coronary artery disease involving native coronary  artery of native heart with angina pectoris ([facility]) Hx of inferior MI in 2007 tx with DES to the  RCA. She underwent POBA to the mid RCA stent and DES to the distal RCA (overlapping with the prior stent) in 07/2017 in [place].  Over the last few mos, she has been having episodes of angina relieved by nitroglycerin.  She had a low risk Myoview in March 2021.  She is on 3 antianginal medications (amlodipine, metoprolol succinate, isosorbide) without relief of symptoms.  Therefore, I recommend proceeding with cardiac catheterization.  I reviewed this with Dr. Meda Coffee (attending MD) who agreed.  Risks and benefits of cardiac catheterization have been discussed with the patient and her daughter.  These include bleeding, infection, kidney damage, stroke, heart attack, death.  The patient understands these risks and is willing to proceed.   -Increase isosorbide to 60 mg daily  -Continue amlodipine, aspirin, atorvastatin, clopidogrel, ezetimibe, metoprolol succinate  -Obtain BMET, CBC today  -Arrange cardiac catheterization next week  2. Essential hypertension Blood pressure above target.  Adjust isosorbide as outlined above.  3. Palpitations Palpitations have essentially resolved on higher dose beta-blocker therapy.  Continue current management.  4. Hyperlipidemia LDL goal <70 LDL optimal on most recent lab work.  Continue current Rx.      Dispo:  No follow-ups on file.   Medication Adjustments/Labs and Tests Ordered: Current medicines are reviewed at length with the patient today.  Concerns regarding medicines are outlined above.  Tests Ordered: Orders Placed This Encounter  Procedures  . Basic metabolic panel  . CBC  . EKG 12-Lead   Medication Changes: Meds ordered this encounter  Medications  . isosorbide mononitrate (IMDUR) 60 MG 24 hr tablet    Sig: Take 1 tablet (60 mg total) by mouth daily.    Dispense:  90 tablet    Refill:  0    Signed, Richardson Dopp, PA-C  03/01/2020 5:21 PM    [facility] [place], [place], [place]  [address] Phone: [phone]; Fax: [phone]

## 2020-02-29 NOTE — Progress Notes (Signed)
Cardiology Office Note:    Date:  03/01/2020   ID:  FALLYN MUNNERLYN, DOB [birth year], MRN [id number]  PCP:  Janith [place], MD  Cardiologist:  Sherren Mocha, MD   Electrophysiologist:  None   Referring MD: Janith Lima, MD   Chief Complaint:  Follow-up (CAD)    Patient Profile:    [name] is a 79 y.o. female with:   Coronary artery disease  ? S/p Inf MI in 2007 >> PCI: DES to RCA ? S/p POBA to [facility] and DES to dRCA in 07/2017 ([facility] in [place], [place])  Mid LAD mod dz >> neg by FFR ? Myoview 11/2019: no ischemia   Bacterial endocarditis  ? Admitted to [facility] in [place], [place]  Rx with IV antibiotics x 3 weeks ? Echocardiogram 10/2018: EF 55-60, AoV calcification (?Lambl's Excrescence) ? 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  Hypertension   Hyperlipidemia   Diabetes mellitus   Prior CV studies: Myoview 11/12/2019 EF > 65, normal perfusion (no ischemia or infarction)  Echocardiogram 05/01/2019 EF 60-65, Gr 2 DD, normal RVSF, mild LAE, mod MAC, trivial MR, mod AoV sclerosis with mobile structure representing Lambl's excrescence   Echocardiogram 10/31/2018 EF 55-60, mod MAC, mod TR, calcification of AoV, mild AI, mobile area of calcium in LVOT (small veg vs Lambl's excrescence)  Event Monitor 08/31/18 NSR, Avg HR 72; 1 six beat NSVT, no AFib  TEE 09/26/17 ([place]) Mild LVH, normal LVSF, no RWMA, mild LAE, mobile structure on AoV extends into LVOT c/w SBE, mild AI, mod TR  Carotid US 09/24/17 ([place]) Conclusions: Mild plaque (<50%) is present in the bilateral internal carotid arteries that is not associated with a hemodynamically significant stenosis.  Antegrade flow with a normal hemodynamic profile was present in both vertebral arteries.  Echo ([place]) 09/24/17 Mild conc LVH, normal LVSF, no RWMA, mild LAE, mobile linear structure in LVOT side of AoV (Lambl's excrescence vs  vegetation), mod TR, mild pulmonary HTN, mild PI  Cardiac Catheterization11/12/18 [facility] in [place], [place]) [place] dist 20 LAD prox 50 (FFR 0.94 - no hemodynamically significant) LCx prox 30; OM1 prox 60 RCA prox 20, mid stent patent with 50 ISR, dist 80 at edge of prior stent PCI: POBA to mid RCA ISR PCI: 2.5 x 12 mm Resolute DES to dist RCA overlapping with prior stent  Echo 07/15/17 [facility] in [place], [place]) Borderline conc LVH, EF 55-60, Gr 1 DD, mild LAE, trivial MR, mild AoV sclerosis, trivial AI, mild to mod TR, RVSP 40-45 (mild pulmo HTN)  Myoview 03/05/12 Overall Impression: Normal stress nuclear study. LV Ejection Fraction: 69%. LV Wall Motion: NL LV Function; NL Wall Motion  History of Present Illness:    [name] [name] was last seen in clinic in 11/2019.  She noted improved chest discomfort on long acting nitrates.  She had symptoms of palpitations and I increased her beta-blocker dose.  She returns for follow up.  She is here today with her daughter.  Since last seen, she has continued to have episodes of angina.  She describes left-sided chest discomfort that is sharp and tight.  She has not had any radiating symptoms.  She has had associated nausea and shortness of breath.  She has not had syncope, orthopnea or lower extremity swelling.  She usually takes nitroglycerin with prompt relief.  She has had symptoms at rest as well as with some activities.  Her symptoms are reminiscent of her previous angina.  She had not had symptoms like this since her PCI in 2018 until about 6-12 months ago.  Past Medical History:  Diagnosis Date  . Allergy   . Anxiety   . CAD (coronary artery disease)    s/p inf MI 2007 - tx w/ DES to RCA // Echo 12/08: EF 60%, mild MR, mild LAE // LHC 08/2009: dLM 20%, LAD beyond diagonal 60-70%, stable from prior catheterization in 2007, ostial circumflex 40-50%, mid RCA stent ok, EF 65% // Myoview 08/2009: EF 76%, small inferoapical fixed  defect, no ischemia // Myoview 7/13: no scar or ischemia, EF 69% //    . Cataract    removed both eyes  . Cerebrovascular disease, unspecified   . Chronic kidney disease    frequent Kidney Infections  . Chronic neck pain   . Chronic pain syndrome   . Diverticular disease   . Dizziness   . DJD (degenerative joint disease)   . DM type 2 (diabetes mellitus, type 2) ([facility])   . Echocardiogram    Echo 10/2018: EF 55-60, normal RVSF, mod MAC, mod TR, severe AoV calcification and sclerosis with nodular calcium/mobile area of calcium in the LVOT (small veg vs Lambl's excrescence  - consider TEE), mild AI, mild AS (mean 11).   . Echocardiogram 04/2019   Echocardiogram 04/2019: EF 60-65, basal septal hypertrophy, grade 2 diastolic dysfunction, normal wall motion, normal RV SF, mild LAE, mod MAC, trivial MR, mod sclerosis of the aortic valve with mod aortic annular calcification, thin mobile filamentous structure on ventricular side of AV likely representing Lambl's excrescence, mild AI, mild TR  . GERD (gastroesophageal reflux disease)   . H/O hiatal hernia   . HTN (hypertension)   . IBS (irritable bowel syndrome)   . 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 Internal hemorrhoids   . Ischemic colitis ([facility])   . Mitral valve prolapse   . Mixed hyperlipidemia   . Myocardial infarction ([facility]) 2007  . Neuromuscular disorder ([facility])    hiatal hernia  . Nocturia   . Pancreatitis    1955 an once more  . PONV (postoperative nausea and vomiting)    Difficluty opening mouth wide and turning head. (Cervical Fusion)  . Premature ventricular contractions   . Tubular adenoma of colon   . Ulcer    [facility] [place] [facility]  . Urinary incontinence     Current Medications: Current Meds  Medication Sig  . amLODipine (NORVASC) 5 MG tablet Take 1 tablet (5 mg total) by mouth daily.  . Ascorbic Acid (VITAMIN C) 1000 MG tablet Take 1,000 mg by mouth daily.    . aspirin EC 81 MG tablet Take 81 mg by mouth every  morning.   . atorvastatin (LIPITOR) 80 MG tablet TAKE ONE TABLET BY MOUTH EVERY EVENING  . Cholecalciferol (VITAMIN D3) 1000 UNITS CAPS Take 1 capsule by mouth daily.    . clopidogrel (PLAVIX) 75 MG tablet Take 1 tablet (75 mg total) by mouth daily.  . DEXILANT 60 MG capsule TAKE ONE CAPSULE BY MOUTH EVERY MORNING  . dicyclomine (BENTYL) 10 MG capsule TAKE ONE CAPSULE BY MOUTH FOUR TIMES DAILY BEFORE MEALS AND AT BEDTIME  . ezetimibe (ZETIA) 10 MG tablet TAKE ONE TABLET BY MOUTH EVERY MORNING  . gabapentin (NEURONTIN) 300 MG capsule TAKE TWO CAPSULES BY MOUTH TWICE DAILY  . metoprolol succinate (TOPROL-XL) 50 MG 24 hr tablet Take 1 tablet (50 mg total) by mouth daily.  . nitroGLYCERIN (NITROSTAT) 0.4 MG SL   tablet Place under the tongue.  Marland Kitchen olmesartan (BENICAR) 40 MG tablet TAKE ONE TABLET BY MOUTH ONCE DAILY  . Omega-3 Fatty Acids (FISH OIL) 1000 MG CAPS Take 2 capsules by mouth 4 (four) times daily.   Marland Kitchen venlafaxine XR (EFFEXOR-XR) 37.5 MG 24 hr capsule TAKE ONE CAPSULE BY MOUTH EVERY MORNING WITH BREAKFAST  . [DISCONTINUED] isosorbide mononitrate (IMDUR) 30 MG 24 hr tablet Take 1 tablet (30 mg total) by mouth daily.     Allergies:   Nitrofurantoin, Niacin and related, Ceftriaxone, Codeine, Prednisone, Sulfonamide derivatives, and Tetracycline   Social History   Tobacco Use  . Smoking status: Never Smoker  . Smokeless tobacco: Never Used  Vaping Use  . Vaping Use: Never used  Substance Use Topics  . Alcohol use: No  . Drug use: No     Family Hx: The patient's family history includes Cancer - Other in her brother; Colon polyps in her sister; Coronary artery disease in some other family members; Heart disease in her mother; Pancreatic cancer in her mother. There is no history of Colon cancer, Esophageal cancer, Rectal cancer, or Stomach cancer.  Review of Systems  Constitutional: Negative for chills and fever.  Respiratory: Negative for cough.   Gastrointestinal: Negative for  hematochezia and melena.  Genitourinary: Negative for hematuria.  All other systems reviewed and are negative.    EKGs/Labs/Other Test Reviewed:    EKG:  EKG is   ordered today.  The ekg ordered today demonstrates normal sinus rhythm, heart rate 63, left axis deviation, no ST-T wave changes, QTC 421  Recent Labs: 09/29/2019: ALT 18; BUN 16; Creatinine, Ser 0.83; Hemoglobin 13.2; Platelets 301.0; Potassium 4.0; Sodium 136   Recent Lipid Panel Lab Results  Component Value Date/Time   CHOL 94 04/30/2019 11:34 AM   TRIG 85.0 04/30/2019 11:34 AM   TRIG 164 (H) 06/24/2006 07:38 AM   HDL 50.20 04/30/2019 11:34 AM   CHOLHDL 2 04/30/2019 11:34 AM   LDLCALC 26 04/30/2019 11:34 AM   LDLDIRECT 68.0 03/10/2015 03:30 PM    Physical Exam:    VS:  BP 140/66   Pulse 68   Ht 5' (1.524 m)   Wt 127 lb (57.6 kg)   BMI 24.80 kg/m     Wt Readings from Last 3 Encounters:  03/01/20 127 lb (57.6 kg)  11/30/19 127 lb (57.6 kg)  11/10/19 127 lb 6.4 oz (57.8 kg)     Constitutional:      Appearance: Healthy appearance. Not in distress.  Neck:     Thyroid: No thyromegaly.     Vascular: JVD normal.     Lymphadenopathy: No cervical adenopathy.  Pulmonary:     Effort: Pulmonary effort is normal.     Breath sounds: No wheezing. No rales.  Cardiovascular:     Normal rate. Regular rhythm. Normal S1. Normal S2.     Murmurs: There is no murmur.  Pulses:    Intact distal pulses.  Edema:    Feet: bilateral trace edema of the feet. Abdominal:     Palpations: Abdomen is soft. There is no hepatomegaly.  Skin:    General: Skin is warm and dry.  Neurological:     Mental Status: Alert and oriented to person, place and time.     Cranial Nerves: Cranial nerves are intact.  Psychiatric:        Mood and Affect: Affect normal.       ASSESSMENT & PLAN:    1. Coronary artery disease involving native coronary  artery of native heart with angina pectoris ([place]) Hx of inferior MI in 2007 tx with DES to the  RCA. She underwent POBA to the mid RCA stent and DES to the distal RCA (overlapping with the prior stent) in 07/2017 in [place].  Over the last few mos, she has been having episodes of angina relieved by nitroglycerin.  She had a low risk Myoview in March 2021.  She is on 3 antianginal medications (amlodipine, metoprolol succinate, isosorbide) without relief of symptoms.  Therefore, I recommend proceeding with cardiac catheterization.  I reviewed this with Dr. Meda Coffee (attending MD) who agreed.  Risks and benefits of cardiac catheterization have been discussed with the patient and her daughter.  These include bleeding, infection, kidney damage, stroke, heart attack, death.  The patient understands these risks and is willing to proceed.   -Increase isosorbide to 60 mg daily  -Continue amlodipine, aspirin, atorvastatin, clopidogrel, ezetimibe, metoprolol succinate  -Obtain BMET, CBC today  -Arrange cardiac catheterization next week  2. Essential hypertension Blood pressure above target.  Adjust isosorbide as outlined above.  3. Palpitations Palpitations have essentially resolved on higher dose beta-blocker therapy.  Continue current management.  4. Hyperlipidemia LDL goal <70 LDL optimal on most recent lab work.  Continue current Rx.      Dispo:  No follow-ups on file.   Medication Adjustments/Labs and Tests Ordered: Current medicines are reviewed at length with the patient today.  Concerns regarding medicines are outlined above.  Tests Ordered: Orders Placed This Encounter  Procedures  . Basic metabolic panel  . CBC  . EKG 12-Lead   Medication Changes: Meds ordered this encounter  Medications  . isosorbide mononitrate (IMDUR) 60 MG 24 hr tablet    Sig: Take 1 tablet (60 mg total) by mouth daily.    Dispense:  90 tablet    Refill:  0    Signed, Richardson Dopp, PA-C  03/01/2020 5:21 PM    [facility] [place], [place], [place]  [address] Phone: [phone]; Fax: [phone]

## 2020-03-01 ENCOUNTER — Other Ambulatory Visit: Payer: Self-pay

## 2020-03-01 ENCOUNTER — Other Ambulatory Visit: Payer: Self-pay | Admitting: Internal Medicine

## 2020-03-01 ENCOUNTER — Encounter: Payer: Self-pay | Admitting: Physician Assistant

## 2020-03-01 ENCOUNTER — Ambulatory Visit: Payer: PPO | Admitting: Physician Assistant

## 2020-03-01 ENCOUNTER — Ambulatory Visit: Payer: Self-pay | Admitting: Pharmacist

## 2020-03-01 VITALS — BP 140/66 | HR 68 | Ht 60.0 in | Wt 127.0 lb

## 2020-03-01 DIAGNOSIS — E785 Hyperlipidemia, unspecified: Secondary | ICD-10-CM | POA: Diagnosis not present

## 2020-03-01 DIAGNOSIS — E118 Type 2 diabetes mellitus with unspecified complications: Secondary | ICD-10-CM

## 2020-03-01 DIAGNOSIS — I25119 Atherosclerotic heart disease of native coronary artery with unspecified angina pectoris: Secondary | ICD-10-CM

## 2020-03-01 DIAGNOSIS — I10 Essential (primary) hypertension: Secondary | ICD-10-CM

## 2020-03-01 DIAGNOSIS — R002 Palpitations: Secondary | ICD-10-CM

## 2020-03-01 DIAGNOSIS — I2510 Atherosclerotic heart disease of native coronary artery without angina pectoris: Secondary | ICD-10-CM

## 2020-03-01 DIAGNOSIS — M542 Cervicalgia: Secondary | ICD-10-CM

## 2020-03-01 DIAGNOSIS — K2100 Gastro-esophageal reflux disease with esophagitis, without bleeding: Secondary | ICD-10-CM

## 2020-03-01 MED ORDER — ISOSORBIDE MONONITRATE ER 60 MG PO TB24: 60.0000 mg | ORAL_TABLET | Freq: Every day | ORAL | 0 refills | Status: DC

## 2020-03-01 NOTE — Chronic Care Management (AMB) (Signed)
  Chronic Care Management   Outreach Note  03/01/2020 Name: [name] [name] MRN: [id number] DOB: 1941/07/05  Referred by: Janith Lima, MD Reason for referral : Chronic Care Management and Medication Management   Reviewed chart for medication changes ahead of medication coordination call. . No Office visits, Consults, or Hospital visits since last care coordination call/Pharmacist visit.  . No medication changes indicated   BP Readings from Last 3 Encounters:  11/30/19 126/70  11/10/19 138/72  09/29/19 (!) 150/60    Lab Results  Component Value Date   HGBA1C 6.6 (H) 09/29/2019     Medication management via [facility]: Patient obtains medications through Adherence [facility]aging  90 Days   Patient is due for next adherence delivery on: 03/04/20 Called patient and reviewed medications and coordinated delivery.  This delivery to include: Isosorb Mono 30mg  Er daily-Breakfast  Ezetimbe 10mg  daily-Breakfast  Venlafaxine 37.5Er daily-Breakfast  Olmesa Medox 40mg  daily-Breakfast  Amlodipine 5mg  daily-Breakfast  Metoprolol 50mg  daily-Breakfast  Dexilant 60mg  daily-Breakfast  Clopidogrel 75mg  daily-Breakfast  Gabapentin 300mg  twice daily-Breakfast, Bedtime  Dicyclomine 10mg  4 times daily- Breakfast, Lunch, Dinner, Bedtime  Atorvastatin 80mg  daily- Bedtime  Patient will need a short fill of isosorbide, gabapentin, Dexilant (3-day suppy), prior to adherence delivery.   Coordinated acute fill to be delivered 03/02/20  Patient needs refills for Ezetimbe, Venlafaxine, Olmesa Medox, Atorvastatin, Gabapentin, Dicyclomine. Pt has f/u appt with PCP 03/03/20 and refills will be sent then as appropriate. I will follow up after appt to check for any med changes prior to adherence delivery.  Confirmed delivery date of 03/02/20 and 03/04/20, advised patient that pharmacy will contact them the morning of delivery.   Charlene Brooke, PharmD

## 2020-03-01 NOTE — Patient Instructions (Addendum)
Medication Instructions:  Your physician has recommended you make the following change in your medication:   Increase Isosorbide to 60mg  daily  *If you need a refill on your cardiac medications before your next appointment, please call your pharmacy*   Lab Work: BMET, CBC Today  If you have labs (blood work) drawn today and your tests are completely normal, you will receive your results only by:  [place] (if you have MyChart) OR  A paper copy in the mail If you have any lab test that is abnormal or we need to change your treatment, we will call you to review the results.   Testing/Procedures:   Follow-Up: At [facility], you and your health needs are our priority.  As part of our continuing mission to provide you with exceptional heart care, we have created designated Provider Care Teams.  These Care Teams include your primary Cardiologist (physician) and Advanced Practice Providers (APPs -  Physician Assistants and Nurse Practitioners) who all work together to provide you with the care you need, when you need it.  We recommend signing up for the patient portal called "MyChart".  Sign up information is provided on this After Visit Summary.  MyChart is used to connect with patients for Virtual Visits (Telemedicine).  Patients are able to view lab/test results, encounter notes, upcoming appointments, etc.  Non-urgent messages can be sent to your provider as well.   To learn more about what you can do with MyChart, go to [email or web address].    Your next appointment:      Other Instructions    [facility] OFFICE [place], [place] [address] Dept: [phone] Loc: [place]  03/01/2020  You are scheduled for a Cardiac Catheterization on Wednesday, July 7 with Dr. Lauree Chandler.  1. Please arrive at the [facility] (Main Entrance A) at  [facility]: [address] [place], [place] [address] at 11:00 AM (This time is two hours before your procedure to ensure your preparation). Free valet parking service is available.   Special note: Every effort is made to have your procedure done on time. Please understand that emergencies sometimes delay scheduled procedures.  2. Diet: Do not eat solid foods after midnight.  The patient may have clear liquids until 5am upon the day of the procedure.  3. Labs: You will need to have blood drawn today. 4. Medication instructions in preparation for your procedure:   Contrast Allergy: No    On the morning of your procedure, take your Aspirin and Plavix and any morning medicines NOT listed above.  You may use sips of water.  5. Plan for one night stay--bring personal belongings. 6. Bring a current list of your medications and current insurance cards. 7. You MUST have a responsible person to drive you home. 8. Someone MUST be with you the first 24 hours after you arrive home or your discharge will be delayed. 9. Please wear clothes that are easy to get on and off and wear slip-on shoes.  Thank you for allowing [place] to care for you!   -- [facility]

## 2020-03-02 ENCOUNTER — Telehealth: Payer: Self-pay

## 2020-03-02 ENCOUNTER — Other Ambulatory Visit: Payer: PPO | Admitting: *Deleted

## 2020-03-02 ENCOUNTER — Other Ambulatory Visit: Payer: Self-pay

## 2020-03-02 DIAGNOSIS — Z79899 Other long term (current) drug therapy: Secondary | ICD-10-CM

## 2020-03-02 LAB — BASIC METABOLIC PANEL
BUN/Creatinine Ratio: 18 (ref 12–28)
BUN/Creatinine Ratio: 22 (ref 12–28)
BUN: 18 mg/dL (ref 8–27)
BUN: 18 mg/dL (ref 8–27)
CO2: 29 mmol/L (ref 20–29)
CO2: 32 mmol/L — ABNORMAL HIGH (ref 20–29)
Calcium: 9.4 mg/dL (ref 8.7–10.3)
Calcium: 9.6 mg/dL (ref 8.7–10.3)
Chloride: 98 mmol/L (ref 96–106)
Chloride: 100 mmol/L (ref 96–106)
Creatinine, Ser: 0.99 mg/dL (ref 0.57–1.00)
Creatinine, Ser: 0.83 mg/dL (ref 0.57–1.00)
GFR calc Af Amer: 62 mL/min/{1.73_m2} (ref >59)
GFR calc Af Amer: 77 mL/min/{1.73_m2} (ref >59)
GFR calc non Af Amer: 54 mL/min/{1.73_m2} — ABNORMAL LOW (ref >59)
GFR calc non Af Amer: 67 mL/min/{1.73_m2} (ref >59)
Glucose: 120 mg/dL — ABNORMAL HIGH (ref 65–99)
Glucose: 181 mg/dL — ABNORMAL HIGH (ref 65–99)
Potassium: 6.1 mmol/L (ref 3.5–5.2)
Potassium: 4.8 mmol/L (ref 3.5–5.2)
Sodium: 139 mmol/L (ref 134–144)
Sodium: 139 mmol/L (ref 134–144)

## 2020-03-02 LAB — CBC
Hematocrit: 40.7 % (ref 34.0–46.6)
Hemoglobin: 13.3 g/dL (ref 11.1–15.9)
MCH: 31.6 pg (ref 26.6–33.0)
MCHC: 32.7 g/dL (ref 31.5–35.7)
MCV: 97 fL (ref 79–97)
Platelets: 267 10*3/uL (ref 150–450)
RBC: 4.21 x10E6/uL (ref 3.77–5.28)
RDW: 12.3 % (ref 11.7–15.4)
WBC: 9.5 10*3/uL (ref 3.4–10.8)

## 2020-03-02 NOTE — Telephone Encounter (Signed)
Pt contacted pre-catheterization scheduled at [facility] for:  03/09/20 Wednesday.  Verified arrival time and place: [place] [facility]) at: 11:00 am   No solid food after midnight prior to cath, clear liquids until 5 AM day of procedure.  AM meds can be  taken pre-cath with sips of water including: ASA 81 mg Plavix 75 mg   Confirmed patient has responsible adult to drive home post procedure and observe 24 hours after arriving home:   You are allowed ONE visitor in the waiting room during your procedure. Both you and your visitor must wear masks.  Pt reports that she has not had the COVID vaccine but she had a "light version" of COVID 08/2019 and she will maintain social distancing and says she plans to stay at home days prior to her 03/09/20 Cardiac Cath.

## 2020-03-02 NOTE — Telephone Encounter (Signed)
Pt agreeable to come in for repeat BMET this morning. Order placed, lab appt made.

## 2020-03-02 NOTE — Telephone Encounter (Signed)
-----   Message from Liliane Shi, PA-C sent at 03/02/2020  8:17 AM EDT ----- Hgb, Creatinine normal.  K+ high. Suspect hemolysis.  Repeat BMET stat this morning for hyperkalemia.  Richardson Dopp, PA-C 03/02/2020 08:17

## 2020-03-03 ENCOUNTER — Other Ambulatory Visit: Payer: Self-pay

## 2020-03-03 ENCOUNTER — Ambulatory Visit (INDEPENDENT_AMBULATORY_CARE_PROVIDER_SITE_OTHER): Payer: PPO | Admitting: Internal Medicine

## 2020-03-03 ENCOUNTER — Encounter: Payer: Self-pay | Admitting: Internal Medicine

## 2020-03-03 VITALS — BP 132/68 | HR 67 | Temp 98.4°F | Resp 16 | Ht 60.0 in | Wt 126.1 lb

## 2020-03-03 DIAGNOSIS — E118 Type 2 diabetes mellitus with unspecified complications: Secondary | ICD-10-CM | POA: Diagnosis not present

## 2020-03-03 DIAGNOSIS — I10 Essential (primary) hypertension: Secondary | ICD-10-CM | POA: Diagnosis not present

## 2020-03-03 DIAGNOSIS — I25119 Atherosclerotic heart disease of native coronary artery with unspecified angina pectoris: Secondary | ICD-10-CM

## 2020-03-03 LAB — POCT GLYCOSYLATED HEMOGLOBIN (HGB A1C): Hemoglobin A1C: 6.7 % — AB (ref 4.0–5.6)

## 2020-03-03 NOTE — Patient Instructions (Signed)
Type 2 Diabetes Mellitus, Diagnosis, Adult Type 2 diabetes (type 2 diabetes mellitus) is a long-term (chronic) disease. In type 2 diabetes, one or both of these problems may be present:  The pancreas does not make enough of a hormone called insulin.  Cells in the body do not respond properly to insulin that the body makes (insulin resistance). Normally, insulin allows blood sugar (glucose) to enter cells in the body. The cells use glucose for energy. Insulin resistance or lack of insulin causes excess glucose to build up in the blood instead of going into cells. As a result, high blood glucose (hyperglycemia) develops. What increases the risk? The following factors may make you more likely to develop type 2 diabetes:  Having a family member with type 2 diabetes.  Being overweight or obese.  Having an inactive (sedentary) lifestyle.  Having been diagnosed with insulin resistance.  Having a history of prediabetes, gestational diabetes, or polycystic ovary syndrome (PCOS).  Being of American-Indian, African-American, Hispanic/Latino, or Asian/Pacific Islander descent. What are the signs or symptoms? In the early stage of this condition, you may not have symptoms. Symptoms develop slowly and may include:  Increased thirst (polydipsia).  Increased hunger(polyphagia).  Increased urination (polyuria).  Increased urination during the night (nocturia).  Unexplained weight loss.  Frequent infections that keep coming back (recurring).  Fatigue.  Weakness.  Vision changes, such as blurry vision.  Cuts or bruises that are slow to heal.  Tingling or numbness in the hands or feet.  Dark patches on the skin (acanthosis nigricans). How is this diagnosed? This condition is diagnosed based on your symptoms, your medical history, a physical exam, and your blood glucose level. Your blood glucose may be checked with one or more of the following blood tests:  A fasting blood glucose (FBG)  test. You will not be allowed to eat (you will fast) for 8 hours or longer before a blood sample is taken.  A random blood glucose test. This test checks blood glucose at any time of day regardless of when you ate.  An A1c (hemoglobin A1c) blood test. This test provides information about blood glucose control over the previous 2-3 months.  An oral glucose tolerance test (OGTT). This test measures your blood glucose at two times: ? After fasting. This is your baseline blood glucose level. ? Two hours after drinking a beverage that contains glucose. You may be diagnosed with type 2 diabetes if:  Your FBG level is 126 mg/dL (7.0 mmol/L) or higher.  Your random blood glucose level is 200 mg/dL (11.1 mmol/L) or higher.  Your A1c level is 6.5% or higher.  Your OGTT result is higher than 200 mg/dL (11.1 mmol/L). These blood tests may be repeated to confirm your diagnosis. How is this treated? Your treatment may be managed by a specialist called an endocrinologist. Type 2 diabetes may be treated by following instructions from your health care provider about:  Making diet and lifestyle changes. This may include: ? Following an individualized nutrition plan that is developed by a diet and nutrition specialist (registered dietitian). ? Exercising regularly. ? Finding ways to manage stress.  Checking your blood glucose level as often as told.  Taking diabetes medicines or insulin daily. This helps to keep your blood glucose levels in the healthy range. ? If you use insulin, you may need to adjust the dosage depending on how physically active you are and what foods you eat. Your health care provider will tell you how to adjust your dosage.    Taking medicines to help prevent complications from diabetes, such as: ? Aspirin. ? Medicine to lower cholesterol. ? Medicine to control blood pressure. Your health care provider will set individualized treatment goals for you. Your goals will be based on  your age, other medical conditions you have, and how you respond to diabetes treatment. Generally, the goal of treatment is to maintain the following blood glucose levels:  Before meals (preprandial): 80-130 mg/dL (4.4-7.2 mmol/L).  After meals (postprandial): below 180 mg/dL (10 mmol/L).  A1c level: less than 7%. Follow these instructions at home: Questions to ask your health care provider  Consider asking the following questions: ? Do I need to meet with a diabetes educator? ? Where can I find a support group for people with diabetes? ? What equipment will I need to manage my diabetes at home? ? What diabetes medicines do I need, and when should I take them? ? How often do I need to check my blood glucose? ? What number can I call if I have questions? ? When is my next appointment? General instructions  Take over-the-counter and prescription medicines only as told by your health care provider.  Keep all follow-up visits as told by your health care provider. This is important.  For more information about diabetes, visit: ? American Diabetes Association (ADA): www.diabetes.org ? American Association of Diabetes Educators (AADE): www.diabeteseducator.org Contact a health care provider if:  Your blood glucose is at or above 240 mg/dL (13.3 mmol/L) for 2 days in a row.  You have been sick or have had a fever for 2 days or longer, and you are not getting better.  You have any of the following problems for more than 6 hours: ? You cannot eat or drink. ? You have nausea and vomiting. ? You have diarrhea. Get help right away if:  Your blood glucose is lower than 54 mg/dL (3.0 mmol/L).  You become confused or you have trouble thinking clearly.  You have difficulty breathing.  You have moderate or large ketone levels in your urine. Summary  Type 2 diabetes (type 2 diabetes mellitus) is a long-term (chronic) disease. In type 2 diabetes, the pancreas does not make enough of a  hormone called insulin, or cells in the body do not respond properly to insulin that the body makes (insulin resistance).  This condition is treated by making diet and lifestyle changes and taking diabetes medicines or insulin.  Your health care provider will set individualized treatment goals for you. Your goals will be based on your age, other medical conditions you have, and how you respond to diabetes treatment.  Keep all follow-up visits as told by your health care provider. This is important. This information is not intended to replace advice given to you by your health care provider. Make sure you discuss any questions you have with your health care provider. Document Revised: 10/18/2017 Document Reviewed: 09/23/2015 Elsevier Patient Education  2020 Elsevier Inc.  

## 2020-03-03 NOTE — Progress Notes (Signed)
Subjective:  Patient ID: [name] [name], female    DOB: 07-11-1941  Age: 79 y.o. MRN: [id number]  CC: Hypertension and Diabetes  This visit occurred during the SARS-CoV-2 public health emergency.  Safety protocols were in place, including screening questions prior to the visit, additional usage of staff PPE, and extensive cleaning of exam room while observing appropriate contact time as indicated for disinfecting solutions.    HPI [name] [name] presents for f/up - She is undergoing a cardiac catheterization next week as a work-up for intermittent chest pain and dizziness.  She says the chest pain responds to nitroglycerin.  The last episode of chest pain was about a week ago.  She denies any recent episodes of diaphoresis, palpitations, near syncope, or edema.  Outpatient Medications Prior to Visit  Medication Sig Dispense Refill  . amLODipine (NORVASC) 5 MG tablet Take 1 tablet (5 mg total) by mouth daily. 90 tablet 1  . Ascorbic Acid (VITAMIN C) 500 MG CAPS Take 500 mg by mouth in the morning and at bedtime.     Marland Kitchen aspirin EC 81 MG tablet Take 81 mg by mouth every morning.     Marland Kitchen atorvastatin (LIPITOR) 80 MG tablet TAKE ONE TABLET BY MOUTH EVERY EVENING (Patient taking differently: Take 80 mg by mouth every evening. ) 90 tablet 0  . Cholecalciferol (VITAMIN D3) 1000 UNITS CAPS Take 1,000 Units by mouth daily.     . clopidogrel (PLAVIX) 75 MG tablet Take 1 tablet (75 mg total) by mouth daily. 90 tablet 0  . DEXILANT 60 MG capsule TAKE ONE CAPSULE BY MOUTH EVERY MORNING (Patient taking differently: Take 60 mg by mouth daily. ) 90 capsule 0  . dicyclomine (BENTYL) 10 MG capsule TAKE ONE CAPSULE BY MOUTH FOUR TIMES DAILY BEFORE MEALS AND AT BEDTIME (Patient taking differently: Take 10 mg by mouth 4 (four) times daily -  before meals and at bedtime. ) 360 capsule 0  . ezetimibe (ZETIA) 10 MG tablet TAKE ONE TABLET BY MOUTH EVERY MORNING (Patient taking differently: Take 10 mg by mouth daily. )  90 tablet 0  . gabapentin (NEURONTIN) 300 MG capsule TAKE TWO CAPSULES BY MOUTH TWICE DAILY (Patient taking differently: Take 600 mg by mouth 2 (two) times daily. ) 360 capsule 0  . isosorbide mononitrate (IMDUR) 30 MG 24 hr tablet Take 60 mg by mouth every morning.     . isosorbide mononitrate (IMDUR) 60 MG 24 hr tablet Take 1 tablet (60 mg total) by mouth daily. 90 tablet 0  . Krill Oil 1000 MG CAPS Take 1,000 mg by mouth daily.    . metoprolol succinate (TOPROL-XL) 50 MG 24 hr tablet Take 1 tablet (50 mg total) by mouth daily. 90 tablet 3  . nitroGLYCERIN (NITROSTAT) 0.4 MG SL tablet Place 0.4 mg under the tongue every 5 (five) minutes as needed for chest pain.     Marland Kitchen olmesartan (BENICAR) 40 MG tablet TAKE ONE TABLET BY MOUTH ONCE DAILY (Patient taking differently: Take 40 mg by mouth daily. ) 90 tablet 0  . Omega-3 Fatty Acids (FISH OIL) 1000 MG CAPS Take 2 capsules by mouth 4 (four) times daily.     Marland Kitchen venlafaxine XR (EFFEXOR-XR) 37.5 MG 24 hr capsule TAKE ONE CAPSULE BY MOUTH EVERY MORNING WITH BREAKFAST (Patient taking differently: Take 37.5 mg by mouth daily with breakfast. ) 90 capsule 0   No facility-administered medications prior to visit.    ROS Review of Systems  Constitutional: Negative for diaphoresis  and fatigue.  HENT: Negative.   Respiratory: Negative for cough, chest tightness, shortness of breath and wheezing.   Cardiovascular: Negative for chest pain, palpitations and leg swelling.  Gastrointestinal: Negative for abdominal pain, constipation, diarrhea, nausea and vomiting.  Endocrine: Negative for polydipsia, polyphagia and polyuria.  Genitourinary: Negative.  Negative for difficulty urinating.  Musculoskeletal: Negative.  Negative for arthralgias and myalgias.  Skin: Negative for color change and pallor.  Neurological: Negative for dizziness, weakness and light-headedness.  Hematological: Negative for adenopathy. Does not bruise/bleed easily.  Psychiatric/Behavioral:  Negative.     Objective:  BP 132/68 (BP Location: Left Arm, Patient Position: Sitting, Cuff Size: Normal)   Pulse 67   Temp 98.4 F (36.9 C) (Oral)   Resp 16   Ht 5' (1.524 m)   Wt 126 lb 2 oz (57.2 kg)   SpO2 93%   BMI 24.63 kg/m   BP Readings from Last 3 Encounters:  03/03/20 132/68  03/01/20 140/66  11/30/19 126/70    Wt Readings from Last 3 Encounters:  03/03/20 126 lb 2 oz (57.2 kg)  03/01/20 127 lb (57.6 kg)  11/30/19 127 lb (57.6 kg)    Physical Exam Vitals reviewed.  Constitutional:      Appearance: Normal appearance.  HENT:     Nose: Nose normal.     Mouth/Throat:     Mouth: Mucous membranes are moist.  Eyes:     General: No scleral icterus.    Conjunctiva/sclera: Conjunctivae normal.  Cardiovascular:     Rate and Rhythm: Normal rate and regular rhythm.     Heart sounds: Murmur heard.  Systolic murmur is present with a grade of 1/6.  No gallop.      Comments: 1/6 SEM RUSB Pulmonary:     Effort: Pulmonary effort is normal.     Breath sounds: No stridor. No wheezing, rhonchi or rales.  Abdominal:     General: Abdomen is flat.     Palpations: There is no mass.     Tenderness: There is no abdominal tenderness. There is no guarding.  Musculoskeletal:        General: Normal range of motion.     Right lower leg: No edema.     Left lower leg: No edema.  Skin:    General: Skin is warm and dry.  Neurological:     General: No focal deficit present.     Mental Status: She is alert.  Psychiatric:        Mood and Affect: Mood normal.        Behavior: Behavior normal.     Lab Results  Component Value Date   WBC 9.5 03/01/2020   HGB 13.3 03/01/2020   HCT 40.7 03/01/2020   PLT 267 03/01/2020   GLUCOSE 181 (H) 03/02/2020   CHOL 94 04/30/2019   TRIG 85.0 04/30/2019   HDL 50.20 04/30/2019   LDLDIRECT 68.0 03/10/2015   LDLCALC 26 04/30/2019   ALT 18 09/29/2019   AST 20 09/29/2019   NA 139 03/02/2020   K 4.8 03/02/2020   CL 100 03/02/2020    CREATININE 0.83 03/02/2020   BUN 18 03/02/2020   CO2 32 (H) 03/02/2020   TSH 2.43 07/15/2018   INR 1.09 01/23/2018   HGBA1C 6.7 (A) 03/03/2020   MICROALBUR 2.6 (H) 07/15/2018    CT FEMUR LEFT WO CONTRAST  Result Date: 01/29/2018 CLINICAL DATA:  Acute onset of left thigh swelling. EXAM: CT OF THE LOWER LEFT EXTREMITY WITHOUT CONTRAST TECHNIQUE: Multidetector CT imaging  of the lower left extremity was performed according to the standard protocol. COMPARISON:  CT of the left femur performed 01/23/2018 FINDINGS: Bones/Joint/Cartilage There is no evidence of fracture or dislocation. No osseous erosions are seen. The left femoral head remains seated at the acetabulum. Mild subcortical cystic change is noted at the superior left acetabulum. 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 There is no evidence of loosening. The inferior aspect of the prosthesis is incompletely imaged. A small knee joint effusion is suspected. Ligaments Suboptimally assessed by CT. Muscles and Tendons Vague soft tissue edema is noted tracking about the proximal quadriceps musculature. Vague intramuscular hematoma is suggested at the lateral aspect of the quadriceps musculature. Underlying myositis cannot be excluded. No abscess is seen. Would correlate for recent traumatic injury. Soft tissues Diffuse mild soft tissue edema is noted tracking along the left thigh, with posterior sparing. The visualized portions of the pelvis are unremarkable, aside from scattered diverticulosis along the sigmoid colon. Postoperative and posttraumatic change is noted about the pubic symphysis. IMPRESSION: 1. No evidence of fracture or dislocation. 2. Vague intramuscular hematoma suggested at the lateral aspect of the quadriceps musculature. Underlying myositis cannot be excluded. Would correlate for recent traumatic injury. No evidence of abscess at this time. 3. Diffuse mild soft tissue edema tracking along the left thigh, with  posterior sparing. 4. Small knee joint effusion suspected. 5. Scattered diverticulosis along the sigmoid colon. Electronically Signed   By: Garald Balding M.D.   On: 01/29/2018 22:33    Assessment & Plan:   [name] [name] was seen today for hypertension and diabetes.  Diagnoses and all orders for this visit:  Controlled type 2 diabetes mellitus with complication, without long-term current use of insulin ([place])- Her A1c is at 6.7%.  Her blood sugars are adequately well controlled.  Treatment with the medication is not indicated. -     POCT glycosylated hemoglobin (Hb A1C)  Essential hypertension- Her blood pressure is adequately well controlled.  Recent electrolytes and renal function were normal.   I am having [name] F. Kerth "FAYE" maintain her Fish Oil, Vitamin D3, Vitamin C, aspirin EC, nitroGLYCERIN, amLODipine, clopidogrel, metoprolol succinate, venlafaxine XR, atorvastatin, ezetimibe, dicyclomine, olmesartan, gabapentin, Dexilant, isosorbide mononitrate, Krill Oil, and isosorbide mononitrate.  No orders of the defined types were placed in this encounter.    Follow-up: Return in about 6 months (around 09/03/2020).  [name], MD

## 2020-03-09 ENCOUNTER — Ambulatory Visit (HOSPITAL_COMMUNITY)
Admission: RE | Disposition: A | Discharge status: Home / Self Care | Payer: Self-pay | Source: Home / Self Care | Attending: Cardiovascular Disease

## 2020-03-09 ENCOUNTER — Other Ambulatory Visit: Payer: Self-pay

## 2020-03-09 ENCOUNTER — Ambulatory Visit (HOSPITAL_COMMUNITY)
Admission: RE | Admit: 2020-03-09 | Discharge: 2020-03-10 | Disposition: A | Discharge status: Home / Self Care | Payer: PPO | Attending: Cardiovascular Disease | Admitting: Cardiovascular Disease

## 2020-03-09 DIAGNOSIS — E785 Hyperlipidemia, unspecified: Secondary | ICD-10-CM | POA: Diagnosis present

## 2020-03-09 DIAGNOSIS — K219 Gastro-esophageal reflux disease without esophagitis: Secondary | ICD-10-CM | POA: Diagnosis not present

## 2020-03-09 DIAGNOSIS — K589 Irritable bowel syndrome without diarrhea: Secondary | ICD-10-CM | POA: Diagnosis not present

## 2020-03-09 DIAGNOSIS — I25119 Atherosclerotic heart disease of native coronary artery with unspecified angina pectoris: Secondary | ICD-10-CM

## 2020-03-09 DIAGNOSIS — Z7982 Long term (current) use of aspirin: Secondary | ICD-10-CM | POA: Insufficient documentation

## 2020-03-09 DIAGNOSIS — I341 Nonrheumatic mitral (valve) prolapse: Secondary | ICD-10-CM | POA: Diagnosis not present

## 2020-03-09 DIAGNOSIS — N189 Chronic kidney disease, unspecified: Secondary | ICD-10-CM | POA: Insufficient documentation

## 2020-03-09 DIAGNOSIS — I200 Unstable angina: Secondary | ICD-10-CM | POA: Diagnosis present

## 2020-03-09 DIAGNOSIS — I252 Old myocardial infarction: Secondary | ICD-10-CM | POA: Insufficient documentation

## 2020-03-09 DIAGNOSIS — Z7902 Long term (current) use of antithrombotics/antiplatelets: Secondary | ICD-10-CM | POA: Diagnosis not present

## 2020-03-09 DIAGNOSIS — E782 Mixed hyperlipidemia: Secondary | ICD-10-CM | POA: Diagnosis not present

## 2020-03-09 DIAGNOSIS — F419 Anxiety disorder, unspecified: Secondary | ICD-10-CM | POA: Insufficient documentation

## 2020-03-09 DIAGNOSIS — E1122 Type 2 diabetes mellitus with diabetic chronic kidney disease: Secondary | ICD-10-CM | POA: Insufficient documentation

## 2020-03-09 DIAGNOSIS — Z955 Presence of coronary angioplasty implant and graft: Secondary | ICD-10-CM | POA: Insufficient documentation

## 2020-03-09 DIAGNOSIS — Z79899 Other long term (current) drug therapy: Secondary | ICD-10-CM | POA: Insufficient documentation

## 2020-03-09 DIAGNOSIS — I129 Hypertensive chronic kidney disease with stage 1 through stage 4 chronic kidney disease, or unspecified chronic kidney disease: Secondary | ICD-10-CM | POA: Diagnosis not present

## 2020-03-09 DIAGNOSIS — G894 Chronic pain syndrome: Secondary | ICD-10-CM | POA: Diagnosis not present

## 2020-03-09 DIAGNOSIS — I25110 Atherosclerotic heart disease of native coronary artery with unstable angina pectoris: Secondary | ICD-10-CM

## 2020-03-09 DIAGNOSIS — I10 Essential (primary) hypertension: Secondary | ICD-10-CM | POA: Diagnosis present

## 2020-03-09 DIAGNOSIS — I2510 Atherosclerotic heart disease of native coronary artery without angina pectoris: Secondary | ICD-10-CM | POA: Diagnosis present

## 2020-03-09 HISTORY — PX: CORONARY STENT INTERVENTION: CATH118234

## 2020-03-09 HISTORY — PX: LEFT HEART CATH AND CORONARY ANGIOGRAPHY: CATH118249

## 2020-03-09 LAB — POCT ACTIVATED CLOTTING TIME
Activated Clotting Time: 290 seconds
Activated Clotting Time: 406 seconds
Activated Clotting Time: 186 seconds
Activated Clotting Time: 208 seconds
Activated Clotting Time: 169 seconds

## 2020-03-09 LAB — GLUCOSE, CAPILLARY: Glucose-Capillary: 134 mg/dL — ABNORMAL HIGH (ref 70–99)

## 2020-03-09 SURGERY — LEFT HEART CATH AND CORONARY ANGIOGRAPHY
Anesthesia: LOCAL

## 2020-03-09 MED ORDER — HEPARIN SODIUM (PORCINE) 1000 UNIT/ML IJ SOLN
INTRAMUSCULAR | Status: AC
  Filled 2020-03-09: qty 1

## 2020-03-09 MED ORDER — IRBESARTAN 150 MG PO TABS
300.0000 mg | ORAL_TABLET | Freq: Every day | ORAL | Status: DC
  Administered 2020-03-10: 300 mg via ORAL
  Filled 2020-03-09: qty 2

## 2020-03-09 MED ORDER — IOHEXOL 350 MG/ML SOLN
INTRAVENOUS | Status: AC
  Filled 2020-03-09: qty 1

## 2020-03-09 MED ORDER — MIDAZOLAM HCL 2 MG/2ML IJ SOLN
INTRAMUSCULAR | Status: DC | PRN
  Administered 2020-03-09 (×2): 1 mg via INTRAVENOUS

## 2020-03-09 MED ORDER — LIDOCAINE HCL (PF) 1 % IJ SOLN
INTRAMUSCULAR | Status: AC
  Filled 2020-03-09: qty 30

## 2020-03-09 MED ORDER — SODIUM CHLORIDE 0.9 % IV SOLN: 250.0000 mL | INTRAVENOUS | Status: DC | PRN

## 2020-03-09 MED ORDER — LIDOCAINE HCL (PF) 1 % IJ SOLN
INTRAMUSCULAR | Status: DC | PRN
  Administered 2020-03-09: 15 mL via SUBCUTANEOUS

## 2020-03-09 MED ORDER — SODIUM CHLORIDE 0.9 % WEIGHT BASED INFUSION: 1.0000 mL/kg/h | INTRAVENOUS | Status: DC

## 2020-03-09 MED ORDER — AMLODIPINE BESYLATE 5 MG PO TABS
5.0000 mg | ORAL_TABLET | Freq: Every day | ORAL | Status: DC
  Administered 2020-03-10: 5 mg via ORAL
  Filled 2020-03-09: qty 1

## 2020-03-09 MED ORDER — CLOPIDOGREL BISULFATE 75 MG PO TABS
75.0000 mg | ORAL_TABLET | Freq: Every day | ORAL | Status: DC
  Administered 2020-03-10: 75 mg via ORAL
  Filled 2020-03-09: qty 1

## 2020-03-09 MED ORDER — NITROGLYCERIN 0.4 MG SL SUBL: 0.4000 mg | SUBLINGUAL_TABLET | SUBLINGUAL | Status: DC | PRN

## 2020-03-09 MED ORDER — HEPARIN (PORCINE) IN NACL 1000-0.9 UT/500ML-% IV SOLN
INTRAVENOUS | Status: DC | PRN
  Administered 2020-03-09 (×4): 500 mL

## 2020-03-09 MED ORDER — ATORVASTATIN CALCIUM 80 MG PO TABS
80.0000 mg | ORAL_TABLET | Freq: Every evening | ORAL | Status: DC
  Administered 2020-03-09: 80 mg via ORAL
  Filled 2020-03-09: qty 1

## 2020-03-09 MED ORDER — VENLAFAXINE HCL ER 37.5 MG PO CP24
37.5000 mg | ORAL_CAPSULE | Freq: Every day | ORAL | Status: DC
  Administered 2020-03-10: 37.5 mg via ORAL
  Filled 2020-03-09: qty 1

## 2020-03-09 MED ORDER — EZETIMIBE 10 MG PO TABS
10.0000 mg | ORAL_TABLET | Freq: Every day | ORAL | Status: DC
  Administered 2020-03-10: 10 mg via ORAL
  Filled 2020-03-09: qty 1

## 2020-03-09 MED ORDER — ASPIRIN 81 MG PO CHEW: 81.0000 mg | CHEWABLE_TABLET | ORAL | Status: DC

## 2020-03-09 MED ORDER — FENTANYL CITRATE (PF) 100 MCG/2ML IJ SOLN
INTRAMUSCULAR | Status: AC
  Filled 2020-03-09: qty 2

## 2020-03-09 MED ORDER — LABETALOL HCL 5 MG/ML IV SOLN: 10.0000 mg | INTRAVENOUS | Status: AC | PRN

## 2020-03-09 MED ORDER — HEPARIN (PORCINE) IN NACL 1000-0.9 UT/500ML-% IV SOLN
INTRAVENOUS | Status: AC
  Filled 2020-03-09: qty 1000

## 2020-03-09 MED ORDER — ACETAMINOPHEN 325 MG PO TABS: 650.0000 mg | ORAL_TABLET | ORAL | Status: DC | PRN

## 2020-03-09 MED ORDER — FENTANYL CITRATE (PF) 100 MCG/2ML IJ SOLN
INTRAMUSCULAR | Status: DC | PRN
  Administered 2020-03-09 (×2): 25 ug via INTRAVENOUS

## 2020-03-09 MED ORDER — ISOSORBIDE MONONITRATE ER 60 MG PO TB24
60.0000 mg | ORAL_TABLET | Freq: Every morning | ORAL | Status: DC
  Administered 2020-03-10: 60 mg via ORAL
  Filled 2020-03-09: qty 1

## 2020-03-09 MED ORDER — GABAPENTIN 300 MG PO CAPS
600.0000 mg | ORAL_CAPSULE | Freq: Two times a day (BID) | ORAL | Status: DC
  Administered 2020-03-09 – 2020-03-10 (×2): 600 mg via ORAL
  Filled 2020-03-09 (×2): qty 2

## 2020-03-09 MED ORDER — DICYCLOMINE HCL 10 MG PO CAPS
10.0000 mg | ORAL_CAPSULE | Freq: Three times a day (TID) | ORAL | Status: DC
  Administered 2020-03-09 – 2020-03-10 (×3): 10 mg via ORAL
  Filled 2020-03-09 (×3): qty 1

## 2020-03-09 MED ORDER — SODIUM CHLORIDE 0.9% FLUSH: 3.0000 mL | INTRAVENOUS | Status: DC | PRN

## 2020-03-09 MED ORDER — SODIUM CHLORIDE 0.9 % IV SOLN: INTRAVENOUS | Status: AC

## 2020-03-09 MED ORDER — HYDRALAZINE HCL 20 MG/ML IJ SOLN: 10.0000 mg | INTRAMUSCULAR | Status: AC | PRN

## 2020-03-09 MED ORDER — IOHEXOL 350 MG/ML SOLN
INTRAVENOUS | Status: DC | PRN
  Administered 2020-03-09: 70 mL

## 2020-03-09 MED ORDER — SODIUM CHLORIDE 0.9 % WEIGHT BASED INFUSION: 3.0000 mL/kg/h | INTRAVENOUS | Status: DC

## 2020-03-09 MED ORDER — SODIUM CHLORIDE 0.9% FLUSH: 3.0000 mL | Freq: Two times a day (BID) | INTRAVENOUS | Status: DC

## 2020-03-09 MED ORDER — ONDANSETRON HCL 4 MG/2ML IJ SOLN: 4.0000 mg | Freq: Four times a day (QID) | INTRAMUSCULAR | Status: DC | PRN

## 2020-03-09 MED ORDER — PANTOPRAZOLE SODIUM 40 MG PO TBEC
40.0000 mg | DELAYED_RELEASE_TABLET | Freq: Every day | ORAL | Status: DC
  Administered 2020-03-10: 40 mg via ORAL
  Filled 2020-03-09: qty 1

## 2020-03-09 MED ORDER — HEPARIN SODIUM (PORCINE) 1000 UNIT/ML IJ SOLN
INTRAMUSCULAR | Status: DC | PRN
  Administered 2020-03-09: 10000 [IU] via INTRAVENOUS

## 2020-03-09 MED ORDER — MIDAZOLAM HCL 2 MG/2ML IJ SOLN
INTRAMUSCULAR | Status: AC
  Filled 2020-03-09: qty 2

## 2020-03-09 MED ORDER — ASPIRIN EC 81 MG PO TBEC
81.0000 mg | DELAYED_RELEASE_TABLET | Freq: Every morning | ORAL | Status: DC
  Administered 2020-03-10: 81 mg via ORAL
  Filled 2020-03-09: qty 1

## 2020-03-09 MED ORDER — VERAPAMIL HCL 2.5 MG/ML IV SOLN
INTRAVENOUS | Status: AC
  Filled 2020-03-09: qty 2

## 2020-03-09 MED ORDER — METOPROLOL SUCCINATE ER 50 MG PO TB24
50.0000 mg | ORAL_TABLET | Freq: Every day | ORAL | Status: DC
  Administered 2020-03-10: 50 mg via ORAL
  Filled 2020-03-09: qty 1

## 2020-03-09 SURGICAL SUPPLY — 18 items
BALLN SAPPHIRE 2.0X12 (BALLOONS) ×2
BALLN SAPPHIRE ~~LOC~~ 3.0X12 (BALLOONS) ×2 IMPLANT
BALLOON SAPPHIRE 2.0X12 (BALLOONS) ×1 IMPLANT
CATH INFINITI 5FR MULTPACK ANG (CATHETERS) ×2 IMPLANT
CATH LAUNCHER 6FR JR4 (CATHETERS) ×2 IMPLANT
GLIDESHEATH SLEND SS 6F .021 (SHEATH) ×2 IMPLANT
GUIDELINER 6F (CATHETERS) ×2 IMPLANT
KIT ENCORE 26 ADVANTAGE (KITS) ×2 IMPLANT
KIT HEART LEFT (KITS) ×2 IMPLANT
PACK CARDIAC CATHETERIZATION (CUSTOM PROCEDURE TRAY) ×2 IMPLANT
SHEATH PINNACLE 5F 10CM (SHEATH) ×2 IMPLANT
SHEATH PINNACLE 6F 10CM (SHEATH) ×2 IMPLANT
SHEATH PROBE COVER 6X72 (BAG) ×2 IMPLANT
STENT RESOLUTE ONYX 2.75X18 (Permanent Stent) ×2 IMPLANT
TRANSDUCER W/STOPCOCK (MISCELLANEOUS) ×2 IMPLANT
TUBING CIL FLEX 10 FLL-RA (TUBING) ×2 IMPLANT
WIRE COUGAR XT STRL 190CM (WIRE) ×2 IMPLANT
WIRE EMERALD 3MM-J .035X150CM (WIRE) ×2 IMPLANT

## 2020-03-09 NOTE — Plan of Care (Signed)
  Problem: Cardiovascular: Goal: Ability to achieve and maintain adequate cardiovascular perfusion will improve Outcome: Completed/Met Goal: Vascular access site(s) Level 0-1 will be maintained Outcome: Completed/Met   Problem: Clinical Measurements: Goal: Ability to maintain clinical measurements within normal limits will improve Outcome: Completed/Met   Problem: Nutrition: Goal: Adequate nutrition will be maintained Outcome: Completed/Met   Problem: Elimination: Goal: Will not experience complications related to urinary retention Outcome: Completed/Met   Problem: Pain Managment: Goal: General experience of comfort will improve Outcome: Completed/Met

## 2020-03-09 NOTE — Interval H&P Note (Signed)
History and Physical Interval Note:  03/09/2020 12:35 PM  [name] [name]  has presented today for surgery, with the diagnosis of cad.  The various methods of treatment have been discussed with the patient and family. After consideration of risks, benefits and other options for treatment, the patient has consented to  Procedure(s): LEFT HEART CATH AND CORONARY ANGIOGRAPHY (N/A) as a surgical intervention.  The patient's history has been reviewed, patient examined, no change in status, stable for surgery.  I have reviewed the patient's chart and labs.  Questions were answered to the patient's satisfaction.    Cath Lab Visit (complete for each Cath Lab visit)  Clinical Evaluation Leading to the Procedure:   ACS: No.  Non-ACS:    Anginal Classification: CCS III  Anti-ischemic medical therapy: Maximal Therapy (2 or more classes of medications)  Non-Invasive Test Results: No non-invasive testing performed  Prior CABG: No previous CABG        Lauree Chandler

## 2020-03-09 NOTE — Progress Notes (Signed)
Site area: Scientific laboratory technician Prior to Removal:  Level 0 Pressure Applied For: 20 min Manual:   yes Patient Status During Pull:  A/O Post Pull Site:  Level 0 Post Pull Instructions Given:  Post instructions given and pt understands. Post Pull Pulses Present: 2+ rt pt Dressing Applied:  Tegaderm and a 4x4 Bedrest begins @ 19:00;00 Comments: Pt leaves cath lab holding area in stable condition. Rt groin is unremarkable and dressing is CDI.

## 2020-03-10 ENCOUNTER — Encounter (HOSPITAL_COMMUNITY): Payer: Self-pay | Admitting: Cardiovascular Disease

## 2020-03-10 DIAGNOSIS — N189 Chronic kidney disease, unspecified: Secondary | ICD-10-CM | POA: Diagnosis not present

## 2020-03-10 DIAGNOSIS — K589 Irritable bowel syndrome without diarrhea: Secondary | ICD-10-CM | POA: Diagnosis not present

## 2020-03-10 DIAGNOSIS — I341 Nonrheumatic mitral (valve) prolapse: Secondary | ICD-10-CM | POA: Diagnosis not present

## 2020-03-10 DIAGNOSIS — E782 Mixed hyperlipidemia: Secondary | ICD-10-CM | POA: Diagnosis not present

## 2020-03-10 DIAGNOSIS — Z955 Presence of coronary angioplasty implant and graft: Secondary | ICD-10-CM | POA: Diagnosis not present

## 2020-03-10 DIAGNOSIS — G894 Chronic pain syndrome: Secondary | ICD-10-CM | POA: Diagnosis not present

## 2020-03-10 DIAGNOSIS — E1122 Type 2 diabetes mellitus with diabetic chronic kidney disease: Secondary | ICD-10-CM | POA: Diagnosis not present

## 2020-03-10 DIAGNOSIS — I25110 Atherosclerotic heart disease of native coronary artery with unstable angina pectoris: Secondary | ICD-10-CM | POA: Diagnosis not present

## 2020-03-10 DIAGNOSIS — F419 Anxiety disorder, unspecified: Secondary | ICD-10-CM | POA: Diagnosis not present

## 2020-03-10 DIAGNOSIS — K219 Gastro-esophageal reflux disease without esophagitis: Secondary | ICD-10-CM | POA: Diagnosis not present

## 2020-03-10 DIAGNOSIS — I129 Hypertensive chronic kidney disease with stage 1 through stage 4 chronic kidney disease, or unspecified chronic kidney disease: Secondary | ICD-10-CM | POA: Diagnosis not present

## 2020-03-10 DIAGNOSIS — I252 Old myocardial infarction: Secondary | ICD-10-CM | POA: Diagnosis not present

## 2020-03-10 LAB — BASIC METABOLIC PANEL
Anion gap: 9 (ref 5–15)
BUN: 15 mg/dL (ref 8–23)
CO2: 27 mmol/L (ref 22–32)
Calcium: 8.8 mg/dL — ABNORMAL LOW (ref 8.9–10.3)
Chloride: 103 mmol/L (ref 98–111)
Creatinine, Ser: 0.75 mg/dL (ref 0.44–1.00)
GFR calc Af Amer: >60 mL/min (ref >60)
GFR calc non Af Amer: >60 mL/min (ref >60)
Glucose, Bld: 107 mg/dL — ABNORMAL HIGH (ref 70–99)
Potassium: 3.9 mmol/L (ref 3.5–5.1)
Sodium: 139 mmol/L (ref 135–145)

## 2020-03-10 LAB — CBC
HCT: 39.2 % (ref 36.0–46.0)
Hemoglobin: 12.4 g/dL (ref 12.0–15.0)
MCH: 30.9 pg (ref 26.0–34.0)
MCHC: 31.6 g/dL (ref 30.0–36.0)
MCV: 97.8 fL (ref 80.0–100.0)
Platelets: 217 10*3/uL (ref 150–400)
RBC: 4.01 MIL/uL (ref 3.87–5.11)
RDW: 12.7 % (ref 11.5–15.5)
WBC: 6.6 10*3/uL (ref 4.0–10.5)
nRBC: 0 % (ref 0.0–0.2)

## 2020-03-10 MED FILL — Verapamil HCl IV Soln 2.5 MG/ML: INTRAVENOUS | Qty: 2 | Status: AC

## 2020-03-10 NOTE — Discharge Summary (Addendum)
Discharge Summary    Patient ID: [name] [name] MRN: [id number]; DOB: 06-Nov-1940  Admit date: 03/09/2020 Discharge date: 03/10/2020  Primary Care Provider: Janith Lima, MD  Primary Cardiologist: Sherren Mocha, MD   Discharge Diagnoses    Principal Problem:   Unstable angina [facility]) Active Problems:   Hyperlipidemia with target LDL less than 100   Essential hypertension   CAD (coronary artery disease)  Diagnostic Studies/Procedures    LHC 03/09/20:   Mid RCA lesion is 10% stenosed.  Prox RCA to Mid RCA lesion is 95% stenosed.  A drug-eluting stent was successfully placed using a [place] [id number].  Post intervention, there is a 0% residual stenosis.  Prox Cx to Mid Cx lesion is 30% stenosed.  Prox LAD to Mid LAD lesion is 50% stenosed.  Mid LAD lesion is 30% stenosed.   1. Large, dominant RCA with patent mid stents. Severe stenosis just prior to the stented segment in the mid vessel.  2. Moderate non-obstructive proximal LAD stenosis 3. Successful PTCA/DES x 1 mid RCA  Recommendations: Continue DAPT with ASA and Plavix. Continue statin and beta blocker.   Diagnostic Dominance: Right  Intervention       History of Present Illness     [name] [name] is a 79 y.o. 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   She had previously been seen in clinic 11/2019 at which time she was placed on long acting nitrates with brief symptom improvement. Since that time, she continued to have episodes of angina.  She described left-sided chest discomfort that is sharp and tight with no radiating symptoms. She did have associated nausea and shortness of breath. She would usually takes nitroglycerin with prompt relief. She had symptoms at rest as well as with some  activities.  Her symptoms are reminiscent of her previous angina.  She had not had symptoms like this since her PCI in 2018 until about 6-12 months ago.  Given this, the plan was to pursue further evaluation with OP [place] performed 03/09/20 which showed a large, dominant RCA with patent mid stents.Severe stenosis just prior to the stented segment in the mid vessel with moderate non-obstructive proximal LAD stenosis. A successful PTCA/DES x 1 mid RCA was performed. Recommendations are for DAPT with ASA and Plavix, uninterrupted for at least 1 year. Continue statin and beta blocker.   The patient has ambulated with cardiac rehab without complication. Cath site stable without signs of hematoma or bleeding. She has follow up with [facility] already scheduled. Post cath creatinine stable at 0.75.   Consultants: None   The patient was seen and examined by Dr. Acie Fredrickson who feels that she is stable and ready for discharge today, 03/10/20  Did the patient have an acute coronary syndrome (MI, NSTEMI, STEMI, etc) this admission?:  No                               Did the patient have a percutaneous coronary intervention (stent / angioplasty)?:  Yes.     Cath/PCI Registry Performance & Quality Measures: 1. Aspirin prescribed? - Yes 2. ADP Receptor Inhibitor (Plavix/Clopidogrel, Brilinta/Ticagrelor or  Effient/Prasugrel) prescribed (includes medically managed patients)? - Yes 3. High Intensity Statin (Lipitor 40-80mg  or Crestor 20-40mg ) prescribed? - Yes 4. For EF <40%, was ACEI/ARB prescribed? - Not Applicable (EF >/= 35%) 5. For EF <40%, Aldosterone Antagonist (Spironolactone or Eplerenone) prescribed? - Not Applicable (EF >/= 59%) 6. Cardiac Rehab Phase II ordered? - Yes   _____________  Discharge Vitals Blood pressure 140/60, pulse 79, temperature 98.7 F (37.1 C), temperature source Oral, resp. rate 17, height 5' (1.524 m), weight 56.5 kg, SpO2 98 %.  Filed Weights    03/09/20 1127 03/10/20 0439  Weight: 57.6 kg 56.5 kg    Labs & Radiologic Studies    CBC Recent Labs    03/10/20 0407  WBC 6.6  HGB 12.4  HCT 39.2  MCV 97.8  PLT 741   Basic Metabolic Panel Recent Labs    03/10/20 0407  NA 139  K 3.9  CL 103  CO2 27  GLUCOSE 107*  BUN 15  CREATININE 0.75  CALCIUM 8.8*   Liver Function Tests No results for input(s): AST, ALT, ALKPHOS, BILITOT, PROT, ALBUMIN in the last 72 hours. No results for input(s): LIPASE, AMYLASE in the last 72 hours. High Sensitivity Troponin:   No results for input(s): TROPONINIHS in the last 720 hours.  BNP Invalid input(s): POCBNP D-Dimer No results for input(s): DDIMER in the last 72 hours. Hemoglobin A1C No results for input(s): HGBA1C in the last 72 hours. Fasting Lipid Panel No results for input(s): CHOL, HDL, LDLCALC, TRIG, CHOLHDL, LDLDIRECT in the last 72 hours. Thyroid Function Tests No results for input(s): TSH, T4TOTAL, T3FREE, THYROIDAB in the last 72 hours.  Invalid input(s): FREET3 _____________  CARDIAC CATHETERIZATION  Result Date: 03/09/2020  Mid RCA lesion is 10% stenosed.  Prox RCA to Mid RCA lesion is 95% stenosed.  A drug-eluting stent was successfully placed using a [place] [id number].  Post intervention, there is a 0% residual stenosis.  Prox Cx to Mid Cx lesion is 30% stenosed.  Prox LAD to Mid LAD lesion is 50% stenosed.  Mid LAD lesion is 30% stenosed.  1. Large, dominant RCA with patent mid stents. Severe stenosis just prior to the stented segment in the mid vessel. 2. Moderate non-obstructive proximal LAD stenosis 3. Successful PTCA/DES x 1 mid RCA Recommendations: Continue DAPT with ASA and Plavix. Continue statin and beta blocker.   Disposition   Pt is being discharged home today in good condition.  Follow-up Plans & Appointments     Follow-up Information    Liliane Shi, PA-C Follow up on 03/22/2020.   Specialties: Cardiology, Physician Assistant Why:  at 8:45am  Contact information: [address] [place] [address] [phone]              Discharge Instructions    Amb Referral to Cardiac Rehabilitation   Complete by: As directed    Diagnosis:  Coronary Stents PTCA     After initial evaluation and assessments completed: Virtual Based Care may be provided alone or in conjunction with Phase 2 Cardiac Rehab based on patient barriers.: Yes   Call MD for:  difficulty breathing, headache or visual disturbances   Complete by: As directed    Call MD for:  extreme fatigue   Complete by: As directed    Call MD for:  hives   Complete by: As directed    Call MD for:  persistant dizziness or light-headedness   Complete by: As directed    Call  MD for:  persistant nausea and vomiting   Complete by: As directed    Call MD for:  redness, tenderness, or signs of infection (pain, swelling, redness, odor or green/yellow discharge around incision site)   Complete by: As directed    Call MD for:  severe uncontrolled pain   Complete by: As directed    Call MD for:  temperature >100.4   Complete by: As directed    Diet - low sodium heart healthy   Complete by: As directed    Discharge instructions   Complete by: As directed    PLEASE DO NOT [place]!!!! Also keep a log of you blood pressures and bring back to your follow up appt. Please call the office with any questions.   Patients taking blood thinners should generally stay away from medicines like ibuprofen, Advil, Motrin, naproxen, and Aleve due to risk of stomach bleeding. You may take Tylenol as directed or talk to your primary doctor about alternatives.  Some studies suggest Prilosec/Omeprazole interacts with Plavix. If you have reflux symptoms please use Protonix for less chance of interaction.   No driving for 5 days. No lifting over 5 lbs for 1 week. No sexual activity for 1 week. Keep procedure site clean & dry. If you notice increased  pain, swelling, bleeding or pus, call/return!  You may shower, but no soaking baths/hot tubs/pools for 1 week.   Increase activity slowly   Complete by: As directed       Discharge Medications   Allergies as of 03/10/2020      Reactions   Nitrofurantoin Nausea Only   Severe nausea   Niacin And Related Other (See Comments)   Must take "Flush-free"   Ceftriaxone Itching   Codeine Nausea Only   Prednisone Itching, Rash   Sulfonamide Derivatives Itching   Tetracycline Itching, Rash      Medication List    TAKE these medications   amLODipine 5 MG tablet Commonly known as: NORVASC Take 1 tablet (5 mg total) by mouth daily.   aspirin EC 81 MG tablet Take 81 mg by mouth every morning.   atorvastatin 80 MG tablet Commonly known as: LIPITOR TAKE ONE TABLET BY MOUTH EVERY EVENING   clopidogrel 75 MG tablet Commonly known as: PLAVIX Take 1 tablet (75 mg total) by mouth daily.   Dexilant 60 MG capsule Generic drug: dexlansoprazole TAKE ONE CAPSULE BY MOUTH EVERY MORNING What changed:   how much to take  when to take this   dicyclomine 10 MG capsule Commonly known as: BENTYL TAKE ONE CAPSULE BY MOUTH FOUR TIMES DAILY BEFORE MEALS AND AT BEDTIME What changed: See the new instructions.   ezetimibe 10 MG tablet Commonly known as: ZETIA TAKE ONE TABLET BY MOUTH EVERY MORNING What changed: when to take this   Fish Oil 1000 MG Caps Take 2 capsules by mouth 4 (four) times daily.   gabapentin 300 MG capsule Commonly known as: NEURONTIN TAKE TWO CAPSULES BY MOUTH TWICE DAILY   isosorbide mononitrate 60 MG 24 hr tablet Commonly known as: IMDUR Take 1 tablet (60 mg total) by mouth daily. What changed: Another medication with the same name was removed. Continue taking this medication, and follow the directions you see here.   Krill Oil 1000 MG Caps Take 1,000 mg by mouth daily.   metoprolol succinate 50 MG 24 hr tablet Commonly known as: TOPROL-XL Take 1 tablet (50 mg  total) by mouth daily.   nitroGLYCERIN 0.4  MG SL tablet Commonly known as: NITROSTAT Place 0.4 mg under the tongue every 5 (five) minutes as needed for chest pain.   olmesartan 40 MG tablet Commonly known as: BENICAR TAKE ONE TABLET BY MOUTH ONCE DAILY   venlafaxine XR 37.5 MG 24 hr capsule Commonly known as: EFFEXOR-XR TAKE ONE CAPSULE BY MOUTH EVERY MORNING WITH BREAKFAST What changed:   how much to take  how to take this  when to take this  additional instructions   Vitamin C 500 MG Caps Take 500 mg by mouth in the morning and at bedtime.   Vitamin D3 25 MCG (1000 UT) Caps Take 1,000 Units by mouth daily.       Outstanding Labs/Studies   None   Duration of Discharge Encounter   Greater than 30 minutes including physician time.  Signed, Kathyrn Drown, NP 03/10/2020, 10:24 AM   Attending Note:   The patient was seen and examined.  Agree with assessment and plan as noted above.  Changes made to the above note as needed.  Patient seen and independently examined with  Kathyrn Drown, NP .   We discussed all aspects of the encounter. I agree with the assessment and plan as stated above.  1.   CAD :   Doing well after PCI of RCA .  Needs new prescription of NTG called in to [place]  Did well with cardiac rehab. Cont ASA and plavix for at least 1 year  2. HTN:   Cont meds.    Will see APP in 3-4 weeks   I have spent a total of 40 minutes with patient reviewing hospital  notes , telemetry, EKGs, labs and examining patient as well as establishing an assessment and plan that was discussed with the patient. > 50% of time was spent in direct patient care.    Thayer Headings, Brooke Bonito., MD, [facility] 03/10/2020, 3:23 PM [address]  [place] Pager [phone]

## 2020-03-10 NOTE — Progress Notes (Signed)
Progress Note  Patient Name: [name] [name] Date of Encounter: 03/10/2020  [facility] Cardiologist: Sherren Mocha, MD    Subjective    79 year old female with a history of coronary artery disease with stenting in the past.  She was admitted to the hospital for recurrent symptoms of unstable angina. She had PCI of her proximal/mid RCA yesterday.  She seems to be doing well and is ready for discharge. She has ambulated with cardiac rehab and did very well.  She understands the importance of aspirin and Plavix.  Inpatient Medications    Scheduled Meds: . amLODipine  5 mg Oral Daily  . aspirin EC  81 mg Oral q morning - 10a  . atorvastatin  80 mg Oral QPM  . clopidogrel  75 mg Oral Daily  . dicyclomine  10 mg Oral TID AC & HS  . ezetimibe  10 mg Oral Daily  . gabapentin  600 mg Oral BID  . irbesartan  300 mg Oral Daily  . isosorbide mononitrate  60 mg Oral q morning - 10a  . metoprolol succinate  50 mg Oral Daily  . pantoprazole  40 mg Oral Daily  . sodium chloride flush  3 mL Intravenous Q12H  . venlafaxine XR  37.5 mg Oral Q breakfast   Continuous Infusions: . sodium chloride     PRN Meds: sodium chloride, acetaminophen, nitroGLYCERIN, ondansetron (ZOFRAN) IV, sodium chloride flush   Vital Signs    Vitals:   03/09/20 2200 03/09/20 2300 03/10/20 0439 03/10/20 0910  BP: (!) 136/56 (!) 122/56 (!) 134/52 140/60  Pulse:   77 79  Resp: 13 10 17    Temp:   98.7 F (37.1 C)   TempSrc:   Oral   SpO2:   98%   Weight:   56.5 kg   Height:        Intake/Output Summary (Last 24 hours) at 03/10/2020 0934 Last data filed at 03/10/2020 0500 Gross per 24 hour  Intake 660 ml  Output 1200 ml  Net -540 ml   Last 3 Weights 03/10/2020 03/09/2020 03/03/2020  Weight (lbs) 124 lb 9.6 oz 127 lb 126 lb 2 oz  Weight (kg) 56.518 kg 57.607 kg 57.21 kg      Telemetry    NSR  - Personally Reviewed  ECG      - Personally Reviewed  Physical Exam   GEN: No acute distress.   Neck: No  JVD Cardiac: RRR, no murmurs, rubs, or gallops.  Respiratory: Clear to auscultation bilaterally. GI: Soft, nontender, non-distended  MS: right radial cath site bruised but pulse is intact.   RFA - good pulses, no hematoma  Neuro:  Nonfocal  Psych: Normal affect   Labs    High Sensitivity Troponin:  No results for input(s): TROPONINIHS in the last 720 hours.    Chemistry Recent Labs  Lab 03/10/20 0407  NA 139  K 3.9  CL 103  CO2 27  GLUCOSE 107*  BUN 15  CREATININE 0.75  CALCIUM 8.8*  GFRNONAA >60  GFRAA >60  ANIONGAP 9     Hematology Recent Labs  Lab 03/10/20 0407  WBC 6.6  RBC 4.01  HGB 12.4  HCT 39.2  MCV 97.8  MCH 30.9  MCHC 31.6  RDW 12.7  PLT 217    BNPNo results for input(s): BNP, PROBNP in the last 168 hours.   DDimer No results for input(s): DDIMER in the last 168 hours.   Radiology    CARDIAC CATHETERIZATION  Result  Date: 03/09/2020  Mid RCA lesion is 10% stenosed.  Prox RCA to Mid RCA lesion is 95% stenosed.  A drug-eluting stent was successfully placed using a [place] [id number].  Post intervention, there is a 0% residual stenosis.  Prox Cx to Mid Cx lesion is 30% stenosed.  Prox LAD to Mid LAD lesion is 50% stenosed.  Mid LAD lesion is 30% stenosed.  1. Large, dominant RCA with patent mid stents. Severe stenosis just prior to the stented segment in the mid vessel. 2. Moderate non-obstructive proximal LAD stenosis 3. Successful PTCA/DES x 1 mid RCA Recommendations: Continue DAPT with ASA and Plavix. Continue statin and beta blocker.    Cardiac Studies     Patient Profile     79 y.o. female with CAD   Assessment & Plan    1.   CAD :   Doing well after PCI of RCA .  Needs new prescription of NTG called in to [place]  Did well with cardiac rehab. Cont ASA and plavix for at least 1 year  2. HTN:   Cont meds.    Will see APP in 3-4 weeks.   For questions or updates, please contact [place] Please consult  [email or web address] for contact info under        Signed, Mertie Moores, MD  03/10/2020, 9:34 AM

## 2020-03-10 NOTE — Progress Notes (Signed)
CARDIAC REHAB PHASE I   PRE:  Rate/Rhythm: 75 SR    BP: sitting 126/65    SaO2:   MODE:  Ambulation: 430 ft   POST:  Rate/Rhythm: 100 ST    BP: sitting 134/68     SaO2:   Tolerated well, no c/o. Discussed stent, Plavix, restrictions, diet, exercise, NTG and CRPII. Pt very receptive. Will refer to [place]. Understands importance of Plavix. [place], ACSM 03/10/2020 8:47 AM

## 2020-03-11 ENCOUNTER — Other Ambulatory Visit: Payer: Self-pay | Admitting: Internal Medicine

## 2020-03-11 DIAGNOSIS — I25119 Atherosclerotic heart disease of native coronary artery with unspecified angina pectoris: Secondary | ICD-10-CM

## 2020-03-11 MED ORDER — NITROGLYCERIN 0.4 MG SL SUBL: 0.4000 mg | SUBLINGUAL_TABLET | SUBLINGUAL | 2 refills | Status: DC | PRN

## 2020-03-16 ENCOUNTER — Telehealth (HOSPITAL_COMMUNITY): Payer: Self-pay

## 2020-03-16 NOTE — Telephone Encounter (Signed)
Pt insurance is active and benefits verified through [employer]. Co-pay $15.00, DED $0.00/$0.00 met, out of pocket $3,400.00/$215.00 met, co-insurance 0%. No pre-authorization required. Passport, 03/16/20 @ 10:02AM, PJR#[id number]  Will contact patient to see if she is interested in the Cardiac Rehab Program. If interested, patient will need to complete follow up appt. Once completed, patient will be contacted for scheduling upon review by the RN Navigator.

## 2020-03-16 NOTE — Telephone Encounter (Signed)
Called patient to see if she is interested in the Cardiac Rehab Program. Patient expressed interest. Explained scheduling process and went over insurance, patient verbalized understanding. Will contact patient for scheduling once f/u has been completed. 

## 2020-03-17 ENCOUNTER — Other Ambulatory Visit: Payer: Self-pay

## 2020-03-17 ENCOUNTER — Ambulatory Visit: Payer: PPO | Admitting: Pharmacist

## 2020-03-17 DIAGNOSIS — E785 Hyperlipidemia, unspecified: Secondary | ICD-10-CM

## 2020-03-17 DIAGNOSIS — I10 Essential (primary) hypertension: Secondary | ICD-10-CM

## 2020-03-17 DIAGNOSIS — I25119 Atherosclerotic heart disease of native coronary artery with unspecified angina pectoris: Secondary | ICD-10-CM

## 2020-03-17 DIAGNOSIS — E118 Type 2 diabetes mellitus with unspecified complications: Secondary | ICD-10-CM

## 2020-03-17 NOTE — Patient Instructions (Addendum)
Visit Information  Phone number for Pharmacist: [phone]  Goals Addressed            This Visit's Progress   . Pharmacy Care Plan       CARE PLAN ENTRY (see longitudinal plan of care for additional care plan information)  Current Barriers:  . Chronic Disease Management support, education, and care coordination needs related to Hypertension, Hyperlipidemia, Diabetes, and Coronary Artery Disease   Hypertension BP Readings from Last 3 Encounters:  03/10/20 140/60  03/03/20 132/68  03/01/20 140/66 .  Pharmacist Clinical Goal(s): o Over the next 120 days, patient will work with PharmD and providers to maintain BP goal <140/90 . Current regimen:  o amlodipine 5 mg daily,  o metoprolol succinate 50 mg daily,  o olmesartan 40 mg daily, o isosorbide MN 60 mg daily . Interventions: o Discussed BP goals and benefits of medication for prevention of heart attack / stroke . Patient self care activities - Over the next 120 days, patient will: o Check BP daily, document, and provide at future appointments o Ensure daily salt intake < 2300 mg/day  Hyperlipidemia / Coronary artery disease Lab Results  Component Value Date/Time   LDLCALC 26 04/30/2019 11:34 AM   LDLDIRECT 68.0 03/10/2015 03:30 PM .  Pharmacist Clinical Goal(s): o Over the next 120 days, patient will work with PharmD and providers to maintain LDL goal < 70 . Current regimen:  o atorvastatin 80 mg daily o ezetimibe 10 mg daily,  o nitroglycerin 0.4 mg as needed o aspirin 81 mg daily,  o clopidogrel 75 mg daily,  o fish oil OTC  . Interventions: o Discussed cholesterol goals and benefits of medication for prevention of heart attack / stroke . Patient self care activities - Over the next 120 days, patient will: o Continue medication as prescribed o Use Nitroglycerin as needed for chest pain; if pain is not relieved by 3 doses 5 min apart, seek medical attention  Diabetes Lab Results  Component Value Date/Time    HGBA1C 6.7 (A) 03/03/2020 01:44 PM   HGBA1C 6.6 (H) 09/29/2019 02:36 PM   HGBA1C 6.5 04/30/2019 11:34 AM .  Pharmacist Clinical Goal(s): o Over the next 120 days, patient will work with PharmD and providers to maintain A1c goal <7% . Current regimen:  o No medications needed . Interventions: o Discussed A1c goals and benefits of diet/exercise for prevention of diabetic complications . Patient self care activities - Over the next 120 days, patient will: o Continue low-sugar diet and exercise routine  Medication management . Pharmacist Clinical Goal(s): o Over the next 120 days, patient will work with PharmD and providers to achieve optimal medication adherence . Current pharmacy: [facility] . Interventions o Comprehensive medication review performed. o Utilize [facility] pharmacy for medication synchronization, packaging and delivery . Patient self care activities - Over the next 120 days, patient will: o Focus on medication adherence by pill pack o Take medications as prescribed o Report any questions or concerns to PharmD and/or provider(s)  Please see past updates related to this goal by clicking on the "Past Updates" button in the selected goal       Patient verbalizes understanding of instructions provided today.   Telephone follow up appointment with pharmacy team member scheduled for: 4 months  Charlene Brooke, PharmD Clinical Pharmacist [facility] [phone] Nitroglycerin sublingual tablets What is this medicine? NITROGLYCERIN (nye troe GLI ser in) is a type of vasodilator. It relaxes blood vessels, increasing the blood  and oxygen supply to your heart. This medicine is used to relieve chest pain caused by angina. It is also used to prevent chest pain before activities like climbing stairs, going outdoors in cold weather, or sexual activity. This medicine may be used for other purposes; ask your health care provider or pharmacist if you have  questions. COMMON BRAND NAME(S): Nitroquick, Nitrostat, Nitrotab What should I tell my health care provider before I take this medicine? They need to know if you have any of these conditions:  anemia  head injury, recent stroke, or bleeding in the brain  liver disease  previous heart attack  an unusual or allergic reaction to nitroglycerin, other medicines, foods, dyes, or preservatives  pregnant or trying to get pregnant  breast-feeding How should I use this medicine? Take this medicine by mouth as needed. At the first sign of an angina attack (chest pain or tightness) place one tablet under your tongue. You can also take this medicine 5 to 10 minutes before an event likely to produce chest pain. Follow the directions on the prescription label. Let the tablet dissolve under the tongue. Do not swallow whole. Replace the dose if you accidentally swallow it. It will help if your mouth is not dry. Saliva around the tablet will help it to dissolve more quickly. Do not eat or drink, smoke or chew tobacco while a tablet is dissolving. If you are not better within 5 minutes after taking ONE dose of nitroglycerin, call 9-1-1 immediately to seek emergency medical care. Do not take more than 3 nitroglycerin tablets over 15 minutes. If you take this medicine often to relieve symptoms of angina, your doctor or health care professional may provide you with different instructions to manage your symptoms. If symptoms do not go away after following these instructions, it is important to call 9-1-1 immediately. Do not take more than 3 nitroglycerin tablets over 15 minutes. Talk to your pediatrician regarding the use of this medicine in children. Special care may be needed. Overdosage: If you think you have taken too much of this medicine contact a poison control center or emergency room at once. NOTE: This medicine is only for you. Do not share this medicine with others. What if I miss a dose? This does not  apply. This medicine is only used as needed. What may interact with this medicine? Do not take this medicine with any of the following medications:  certain migraine medicines like ergotamine and dihydroergotamine (DHE)  medicines used to treat erectile dysfunction like sildenafil, tadalafil, and vardenafil  riociguat This medicine may also interact with the following medications:  alteplase  aspirin  heparin  medicines for high blood pressure  medicines for mental depression  other medicines used to treat angina  phenothiazines like chlorpromazine, mesoridazine, prochlorperazine, thioridazine This list may not describe all possible interactions. Give your health care provider a list of all the medicines, herbs, non-prescription drugs, or dietary supplements you use. Also tell them if you smoke, drink alcohol, or use illegal drugs. Some items may interact with your medicine. What should I watch for while using this medicine? Tell your doctor or health care professional if you feel your medicine is no longer working. Keep this medicine with you at all times. Sit or lie down when you take your medicine to prevent falling if you feel dizzy or faint after using it. Try to remain calm. This will help you to feel better faster. If you feel dizzy, take several deep breaths and lie  down with your feet propped up, or bend forward with your head resting between your knees. You may get drowsy or dizzy. Do not drive, use machinery, or do anything that needs mental alertness until you know how this drug affects you. Do not stand or sit up quickly, especially if you are an older patient. This reduces the risk of dizzy or fainting spells. Alcohol can make you more drowsy and dizzy. Avoid alcoholic drinks. Do not treat yourself for coughs, colds, or pain while you are taking this medicine without asking your doctor or health care professional for advice. Some ingredients may increase your blood  pressure. What side effects may I notice from receiving this medicine? Side effects that you should report to your doctor or health care professional as soon as possible:  blurred vision  dry mouth  skin rash  sweating  the feeling of extreme pressure in the head  unusually weak or tired Side effects that usually do not require medical attention (report to your doctor or health care professional if they continue or are bothersome):  flushing of the face or neck  headache  irregular heartbeat, palpitations  nausea, vomiting This list may not describe all possible side effects. Call your doctor for medical advice about side effects. You may report side effects to FDA at 1-800-FDA-1088. Where should I keep my medicine? Keep out of the reach of children. Store at room temperature between 20 and 25 degrees C (68 and 77 degrees F). Store in [facility] of Staff. Protect from light and moisture. Keep tightly closed. Throw away any unused medicine after the expiration date. NOTE: This sheet is a summary. It may not cover all possible information. If you have questions about this medicine, talk to your doctor, pharmacist, or health care provider.  2020 Elsevier/Gold Standard (2013-06-18 17:57:36)

## 2020-03-17 NOTE — Chronic Care Management (AMB) (Signed)
Chronic Care Management Pharmacy  Name: [name] [name]  MRN: [id number] DOB: [birth year]   Chief Complaint/ HPI  [name] [name],  79 y.o. , female presents for their Follow-Up CCM visit with the clinical pharmacist via telephone due to COVID-19 Pandemic.  PCP : Janith Lima, MD  Their chronic conditions include: HTN, CAD (MI 2007), T2DM, GERD, HLD, depression/anxiety, DJD   Pt has moved in with daughter who is a Marine scientist, son-in-law is a retired Theme park manager.  Office Visits: 03/03/20 Dr Ronnald Ramp OV: A1c controlled, no medication indicated. Continue same meds.  09/29/19 Dr Ronnald Ramp OV: wound check for abscess on RLE, resolving. No med changes.   09/23/19 Dr Ronnald Ramp OV: blunt trauma to RLE, I&D of hematoma in office.  04/30/19 Dr Ronnald Ramp OV: labs stable, no med changes.  Consult Visit: 03/09/20 admission for cardiac cath: DES x 1 mid RCA, rec'd ASA + Plavix for 1 year.  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 Pt c/o anginal sx, rec'd cardiac cath for following week. Increased isosorbide to 60 mg  11/30/19 PA Weaver (cardiology): recent chest pain, Myoview negative ischemia, pt c/o palpitations. Increased metoprolol to 50 mg.   05/20/19 Dr Hilarie Fredrickson (GI): internal hemorrhoids - rec'd OTC prep H w/ hydrocortisone suppositories x 5 nights.   Medications: Outpatient Encounter Medications as of 03/17/2020  Medication Sig Note  . amLODipine (NORVASC) 5 MG tablet Take 1 tablet (5 mg total) by mouth daily.   . Ascorbic Acid (VITAMIN C) 500 MG CAPS Take 500 mg by mouth in the morning and at bedtime.    Marland Kitchen aspirin EC 81 MG tablet Take 81 mg by mouth every morning.    Marland Kitchen atorvastatin (LIPITOR) 80 MG tablet TAKE ONE TABLET BY MOUTH EVERY EVENING (Patient taking differently: Take 80 mg by mouth every evening. )   . Cholecalciferol (VITAMIN D3) 1000 UNITS CAPS Take 1,000 Units by mouth daily.    . clopidogrel (PLAVIX) 75 MG tablet Take 1 tablet (75 mg total) by mouth daily.   Marland Kitchen  DEXILANT 60 MG capsule TAKE ONE CAPSULE BY MOUTH EVERY MORNING (Patient taking differently: Take 60 mg by mouth daily. )   . dicyclomine (BENTYL) 10 MG capsule TAKE ONE CAPSULE BY MOUTH FOUR TIMES DAILY BEFORE MEALS AND AT BEDTIME (Patient taking differently: Take 10 mg by mouth 4 (four) times daily -  before meals and at bedtime. )   . ezetimibe (ZETIA) 10 MG tablet TAKE ONE TABLET BY MOUTH EVERY MORNING (Patient taking differently: Take 10 mg by mouth daily. )   . gabapentin (NEURONTIN) 300 MG capsule TAKE TWO CAPSULES BY MOUTH TWICE DAILY (Patient taking differently: Take 600 mg by mouth 2 (two) times daily. )   . isosorbide mononitrate (IMDUR) 60 MG 24 hr tablet Take 1 tablet (60 mg total) by mouth daily. 03/02/2020: Has not started yet  . Krill Oil 1000 MG CAPS Take 1,000 mg by mouth daily.   . metoprolol succinate (TOPROL-XL) 50 MG 24 hr tablet Take 1 tablet (50 mg total) by mouth daily.   . nitroGLYCERIN (NITROSTAT) 0.4 MG SL tablet Place 1 tablet (0.4 mg total) under the tongue every 5 (five) minutes as needed for chest pain.   Marland Kitchen olmesartan (BENICAR) 40 MG tablet TAKE ONE TABLET BY MOUTH ONCE DAILY (Patient taking differently: Take 40 mg by mouth daily. )   . Omega-3 Fatty Acids (FISH OIL) 1000 MG CAPS Take 2 capsules by mouth 4 (four) times daily.    Marland Kitchen  venlafaxine XR (EFFEXOR-XR) 37.5 MG 24 hr capsule TAKE ONE CAPSULE BY MOUTH EVERY MORNING WITH BREAKFAST (Patient taking differently: Take 37.5 mg by mouth daily with breakfast. )    No facility-administered encounter medications on file as of 03/17/2020.     Current Diagnosis/Assessment:    Goals Addressed   None     Diabetes   A1c goal < 7%  Recent Relevant Labs: Lab Results  Component Value Date/Time   HGBA1C 6.7 (A) 03/03/2020 01:44 PM   HGBA1C 6.6 (H) 09/29/2019 02:36 PM   HGBA1C 6.5 04/30/2019 11:34 AM   MICROALBUR 2.6 (H) 07/15/2018 10:24 AM   MICROALBUR 1.0 10/20/2015 11:47 AM     Patient has failed these meds in  past: glimepiride, metformin Patient is currently controlled on the following medications:   no medications indicated  We discussed: A1c is well controlled without medications; congratulated patient on good control with diet and exercise.  Plan  Continue control with diet and exercise   Hypertension   BP goal < 140/90  Office blood pressures are  BP Readings from Last 3 Encounters:  03/10/20 140/60  03/03/20 132/68  03/01/20 140/66   Patient checks BP at home 1-2x per week  Patient home BP readings are ranging: 127/56, 121/54  Patient has failed these meds in the past: n/a Patient is currently controlled on the following medications:   amlodipine 5 mg daily,   metoprolol succinate 50 mg daily,   olmesartan 40 mg daily,  isosorbide MN 60 mg daily  We discussed: BP goals; pt reports BP has been "much better" lately, she does endorse occasional dizziness when she stands up too quickly; counseled to stand slowly and make sure she has support before moving.   Plan  Continue current medications and control with diet and exercise   Hyperlipidemia/CAD   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 Myoview 3/21: no ischemia present. LDL goal < 70  Lipid Panel     Component Value Date/Time   CHOL 94 04/30/2019 1134   TRIG 85.0 04/30/2019 1134   TRIG 164 (H) 06/24/2006 0738   HDL 50.20 04/30/2019 1134   CHOLHDL 2 04/30/2019 1134   VLDL 17.0 04/30/2019 1134   LDLCALC 26 04/30/2019 1134   LDLDIRECT 68.0 03/10/2015 1530     Hepatic Function Latest Ref Rng & Units 09/29/2019 07/15/2018 03/10/2015  Total Protein 6.0 - 8.3 g/dL 6.9 7.0 7.2  Albumin 3.5 - 5.2 g/dL 4.1 4.3 4.0  AST 0 - 37 U/L _0 ALT 0 - 35 U/L _1 Alk Phosphatase 39 - 117 U/L 53 50 70  Total Bilirubin 0.2 - 1.2 mg/dL 0.3 0.5 0.4  Bilirubin, Direct 0.0 - 0.3 mg/dL 0.1 - -   Patient has failed these meds in past: n/a Patient is currently controlled on the following medications:   atorvastatin 80 mg daily HS,     ezetimibe 10 mg daily,  aspirin 81 mg daily,   Isosorbide MN 60 mg daily  clopidogrel 75 mg daily,   nitroglycerin 0.4 mg SL prn,   fish oil OTC   We discussed:  Recent stent last week; pt reports she is feeling better every day; she has used NTG once due to pain across her chest but it resolved quickly without further issue. Counseled to seek medical attention if chest pain is not relieved after 3 doses of NTG 5 min apart. Pt is also planning to enroll in cardiac rehab program.  Plan  Continue current  medications and control with diet and exercise    Medication Management   Pt uses [facility] for all medications 90-day pill packs Pt endorses 100% compliance  We discussed: Reviewed patient's [facility] medication and Epic medication profile assuring there are no discrepancies or gaps in therapy. Confirmed all fill dates appropriate and verified with patient that there is a sufficient quantity of all prescribed medications at home. Informed patient to call me any time if needing medications before scheduled deliveries. The  medication sync date was 03/03/20  Plan  Utilize [facility] pharmacy for medication synchronization, packaging and delivery     Follow up: 4 month phone visit  Charlene Brooke, PharmD Clinical Pharmacist [facility] [phone]

## 2020-03-21 NOTE — Progress Notes (Signed)
Cardiology Office Note:    Date:  03/22/2020   ID:  [name] [name], DOB Oct 14, 1940, MRN [id number]  PCP:  Janith Lima, MD  Cardiologist:  Sherren Mocha, MD   Electrophysiologist:  None   Referring MD: Janith Lima, MD   Chief Complaint:  Hospitalization Follow-up (Status post cardiac cath, PCI)    Patient Profile:    [name] [name] is a 79 y.o. female with:   Coronary artery disease  ? S/p Inf MI in 2007 >> PCI: DES to RCA ? S/p POBA to [facility] and DES to dRCA in 07/2017 ([facility] in [place], [place])  Mid LAD mod dz >> neg by FFR ? Myoview 11/2019: no ischemia  ? Cath 7/21: pRCA 95 >> PCI: DES   Bacterial endocarditis  ? Admitted to [facility] in [place], [place]  Rx with IV antibiotics x 3 weeks ? Echocardiogram 10/2018: EF 55-60, AoV calcification (?Lambl's Excrescence) ? 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  Hypertension   Hyperlipidemia   Diabetes mellitus   Prior CV studies: Cardiac catheterization 2020-03-10 RCA proximal 95, mid stents patent with 10 ISR LCx proximal 30 LAD proximal 50, mid 30 PCI: 2.75 x 18 mm Resolute Onyx DES to the proximal RCA    Myoview 11/12/2019 EF > 65, normal perfusion (no ischemia or infarction)  Echocardiogram 05/01/2019 EF 60-65, Gr 2 DD, normal RVSF, mild LAE, mod MAC, trivial MR, mod AoV sclerosis with mobile structure representing Lambl's excrescence   Echocardiogram 10/31/2018 EF 55-60, mod MAC, mod TR, calcification of AoV, mild AI, mobile area of calcium in LVOT (small veg vs Lambl's excrescence)  Event Monitor 08/31/18 NSR, Avg HR 72; 1 six beat NSVT, no AFib  TEE 09/26/17 ([place]) Mild LVH, normal LVSF, no RWMA, mild LAE, mobile structure on AoV extends into LVOT c/w SBE, mild AI, mod TR  Carotid US 09/24/17 ([place]) Conclusions: Mild plaque (<50%) is present in the bilateral internal carotid arteries that is not associated with a hemodynamically  significant stenosis.  Antegrade flow with a normal hemodynamic profile was present in both vertebral arteries.  Echo ([place]) 09/24/17 Mild conc LVH, normal LVSF, no RWMA, mild LAE, mobile linear structure in LVOT side of AoV (Lambl's excrescence vs vegetation), mod TR, mild pulmonary HTN, mild PI  Cardiac Catheterization11/12/18 [facility] in [place], [place]) [place] dist 20 LAD prox 50 (FFR 0.94 - no hemodynamically significant) LCx prox 30; OM1 prox 60 RCA prox 20, mid stent patent with 50 ISR, dist 80 at edge of prior stent PCI: POBA to mid RCA ISR PCI: 2.5 x 12 mm Resolute DES to dist RCA overlapping with prior stent  Echo 07/15/17 [facility] in [place], [place]) Borderline conc LVH, EF 55-60, Gr 1 DD, mild LAE, trivial MR, mild AoV sclerosis, trivial AI, mild to mod TR, RVSP 40-45 (mild pulmo HTN)  Myoview 03/05/12 Overall Impression: Normal stress nuclear study. LV Ejection Fraction: 69%. LV Wall Motion: NL LV Function; NL Wall Motion  History of Present Illness:    [name] [name] was last seen 03/01/2020.  She had symptoms of progressive angina and I set her up for cardiac catheterization.  This demonstrated patent stents in the mid RCA.  However, she had high-grade disease proximal to the previous stent in the RCA which was treated with a single drug-eluting stent.  Post PCI course was uneventful.  She returns for follow-up.   She is here with her daughter.  Since her PCI, she has felt better.  She still has some residual right-sided chest discomfort.  This is different from her angina.  It seems to be getting better.  She has been walking without significant shortness of breath.  She has not had syncope, orthopnea or significant leg swelling.  Her right groin site is stable without significant discomfort.  Past Medical History:  Diagnosis Date   Allergy    Anxiety    CAD (coronary artery disease)    s/p inf MI 2007 - tx w/ DES to RCA // Echo 12/08: EF 60%,  mild MR, mild LAE // [place] 08/2009: dLM 20%, LAD beyond diagonal 60-70%, stable from prior catheterization in 2007, ostial circumflex 40-50%, mid RCA stent ok, EF 65% // Myoview 08/2009: EF 76%, small inferoapical fixed defect, no ischemia // Myoview 7/13: no scar or ischemia, EF 69% //     Cataract    removed both eyes   Cerebrovascular disease, unspecified    Chronic kidney disease    frequent Kidney Infections   Chronic neck pain    Chronic pain syndrome    Diverticular disease    Dizziness    DJD (degenerative joint disease)    DM type 2 (diabetes mellitus, type 2) (HCC)    Echocardiogram    Echo 10/2018: EF 55-60, normal RVSF, mod MAC, mod TR, severe AoV calcification and sclerosis with nodular calcium/mobile area of calcium in the LVOT (small veg vs Lambl's excrescence  - consider TEE), mild AI, mild AS (mean 11).    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 (Cervical Fusion)   Premature ventricular contractions    Tubular adenoma of colon    Ulcer    [facility]   Urinary incontinence     Current Medications: Current Meds    Medication Sig   amLODipine (NORVASC) 5 MG tablet Take 1 tablet (5 mg total) by mouth daily.   Ascorbic Acid (VITAMIN C) 500 MG CAPS Take 500 mg by mouth in the morning and at bedtime.    aspirin EC 81 MG tablet Take 81 mg by mouth every morning.    atorvastatin (LIPITOR) 80 MG tablet TAKE ONE TABLET BY MOUTH EVERY EVENING   Cholecalciferol (VITAMIN D3) 1000 UNITS CAPS Take 1,000 Units by mouth daily.    clopidogrel (PLAVIX) 75 MG tablet Take 1 tablet (75 mg total) by mouth daily.   DEXILANT 60 MG capsule TAKE ONE CAPSULE BY MOUTH EVERY MORNING   dicyclomine (BENTYL) 10 MG capsule TAKE ONE CAPSULE BY MOUTH FOUR TIMES DAILY BEFORE MEALS AND AT BEDTIME   ezetimibe (ZETIA) 10 MG tablet TAKE ONE TABLET BY MOUTH EVERY MORNING  gabapentin (NEURONTIN) 300 MG capsule TAKE TWO CAPSULES BY MOUTH TWICE DAILY   isosorbide mononitrate (IMDUR) 60 MG 24 hr tablet Take 1 tablet (60 mg total) by mouth daily.   Krill Oil 1000 MG CAPS Take 1,000 mg by mouth daily.   metoprolol succinate (TOPROL-XL) 50 MG 24 hr tablet Take 1 tablet (50 mg total) by mouth daily.   nitroGLYCERIN (NITROSTAT) 0.4 MG SL tablet Place 1 tablet (0.4 mg total) under the tongue every 5 (five) minutes as needed for chest pain.   olmesartan (BENICAR) 40 MG tablet TAKE ONE TABLET BY MOUTH ONCE DAILY   Omega-3 Fatty Acids (FISH OIL) 1000 MG CAPS Take 1 capsule by mouth 2 (two) times daily.    venlafaxine XR (EFFEXOR-XR) 37.5 MG 24 hr capsule TAKE ONE CAPSULE BY MOUTH EVERY MORNING WITH BREAKFAST     Allergies:   Nitrofurantoin, Niacin and related, Ceftriaxone, Codeine, Prednisone, Sulfonamide derivatives, and Tetracycline   Social History   Tobacco Use   Smoking status: Never Smoker   Smokeless tobacco: Never Used  Vaping Use   Vaping Use: Never used  Substance Use Topics   Alcohol use: No   Drug use: No     Family Hx: The patient's family history includes Cancer - Other in her brother; Colon polyps in her  sister; Coronary artery disease in some other family members; Heart disease in her mother; Pancreatic cancer in her mother. There is no history of Colon cancer, Esophageal cancer, Rectal cancer, or Stomach cancer.    EKGs/Labs/Other Test Reviewed:    EKG:  EKG is    ordered today.  The ekg ordered today demonstrates normal sinus rhythm, heart rate 75, left axis deviation, no ST-T wave changes, QTC 410, change from prior tracing  Recent Labs: 09/29/2019: ALT 18 03/10/2020: BUN 15; Creatinine, Ser 0.75; Hemoglobin 12.4; Platelets 217; Potassium 3.9; Sodium 139   Recent Lipid Panel Lab Results  Component Value Date/Time   CHOL 94 04/30/2019 11:34 AM   TRIG 85.0 04/30/2019 11:34 AM   TRIG 164 (H) 06/24/2006 07:38 AM   HDL 50.20 04/30/2019 11:34 AM   CHOLHDL 2 04/30/2019 11:34 AM   LDLCALC 26 04/30/2019 11:34 AM   LDLDIRECT 68.0 03/10/2015 03:30 PM    Physical Exam:    VS:  BP (!) 122/58    Pulse 75    Ht 5' (1.524 m)    Wt 124 lb (56.2 kg)    SpO2 95%    BMI 24.22 kg/m     Wt Readings from Last 3 Encounters:  03/22/20 124 lb (56.2 kg)  03/10/20 124 lb 9.6 oz (56.5 kg)  03/03/20 126 lb 2 oz (57.2 kg)     Constitutional:      Appearance: Healthy appearance. Not in distress.  Neck:     Vascular: JVD normal.  Pulmonary:     Effort: Pulmonary effort is normal.     Breath sounds: No wheezing. No rales.  Cardiovascular:     Normal rate. Regular rhythm. Normal S1. Normal S2.     Murmurs: There is a grade 1/6 systolic murmur at the URSB.     Comments: R FA site without hematoma or bruit Edema:    Peripheral edema absent.  Abdominal:     Palpations: Abdomen is soft.  Skin:    General: Skin is warm and dry.  Neurological:     Mental Status: Alert and oriented to person, place and time.     Cranial Nerves: Cranial nerves are intact.  ASSESSMENT & PLAN:    1. Coronary artery disease involving native coronary artery of native heart with angina pectoris ([place]) Hx of  inferior MI in 2007 tx with DES to the RCA. She underwent POBA to the mid RCA stent and DES to the distal RCA (overlapping with the prior stent) in 07/2017 in [place].  Myoview in 11/2019 was low risk.  However, she developed progressive anginal symptoms and a recent cardiac catheterization demonstrated high grade stenosis in the RCA prior to the mid stents.  This was tx with a DES.  She is doing well without recurrent angina.  She is having some atypical chest pain that seems to be improving. Question if this is MSK.  Her ECG is normal.  She knows to contact us if her symptoms persist or worsen.  Continue aspirin, clopidogrel, atorvastatin, ezetimibe, isosorbide, metoprolol, amlodipine.  Follow-up in 3 months.  2. Essential hypertension The patient's blood pressure is controlled on her current regimen.  Continue current therapy.   3. Hyperlipidemia LDL goal <70 LDL optimal on most recent lab work.  Continue current Rx.      Dispo:  No follow-ups on file.   Medication Adjustments/Labs and Tests Ordered: Current medicines are reviewed at length with the patient today.  Concerns regarding medicines are outlined above.  Tests Ordered: Orders Placed This Encounter  Procedures   EKG 12-Lead   Medication Changes: No orders of the defined types were placed in this encounter.   Signed, Richardson Dopp, PA-C  03/22/2020 9:19 AM    [facility] [address], [place], [place]  [address] Phone: [phone]; Fax: [phone]

## 2020-03-22 ENCOUNTER — Other Ambulatory Visit: Payer: Self-pay

## 2020-03-22 ENCOUNTER — Ambulatory Visit: Payer: PPO | Admitting: Physician Assistant

## 2020-03-22 ENCOUNTER — Encounter: Payer: Self-pay | Admitting: Physician Assistant

## 2020-03-22 VITALS — BP 122/58 | HR 75 | Ht 60.0 in | Wt 124.0 lb

## 2020-03-22 DIAGNOSIS — I25119 Atherosclerotic heart disease of native coronary artery with unspecified angina pectoris: Secondary | ICD-10-CM | POA: Diagnosis not present

## 2020-03-22 DIAGNOSIS — I10 Essential (primary) hypertension: Secondary | ICD-10-CM | POA: Diagnosis not present

## 2020-03-22 DIAGNOSIS — E785 Hyperlipidemia, unspecified: Secondary | ICD-10-CM

## 2020-03-22 NOTE — Patient Instructions (Signed)
Medication Instructions:  Your physician recommends that you continue on your current medications as directed. Please refer to the Current Medication list given to you today.  *If you need a refill on your cardiac medications before your next appointment, please call your pharmacy*  Lab Work: None ordered today  If you have labs (blood work) drawn today and your tests are completely normal, you will receive your results only by: Marland Kitchen MyChart Message (if you have MyChart) OR . A paper copy in the mail If you have any lab test that is abnormal or we need to change your treatment, we will call you to review the results.  Testing/Procedures: None ordered today  Follow-Up:  On 07/11/20 at 9:20 with Sherren Mocha, MD

## 2020-04-07 ENCOUNTER — Telehealth: Payer: Self-pay | Admitting: Cardiovascular Disease

## 2020-04-07 DIAGNOSIS — I25119 Atherosclerotic heart disease of native coronary artery with unspecified angina pectoris: Secondary | ICD-10-CM

## 2020-04-07 MED ORDER — ISOSORBIDE MONONITRATE ER 30 MG PO TB24: 30.0000 mg | ORAL_TABLET | Freq: Every day | ORAL | 3 refills | Status: DC

## 2020-04-07 NOTE — Telephone Encounter (Signed)
[name] [name] reports several days after she increased her Imdur to 60mg  qd, she started experiencing dizziness when bending over and standing up from sitting position. She has checked her BP and HR during an episode and they are always fine (120s/60s and HR in 70s-80s).  She had vertigo several years ago but this feels different. She does not get nauseous, just dizzy.  Her CP has improved since the increase, but she just doesn't feel well on the higher dose.  Instructed the patient to decrease her Imdur back to highest tolerated dose (30 mg daily). She understands Nicki Reaper will be consulted and she will be called with his recommendations.

## 2020-04-07 NOTE — Telephone Encounter (Signed)
Agree with plan. Call if angina worsens or if BP increases above goal (<130/80). Richardson Dopp, PA-C    04/07/2020 4:38 PM

## 2020-04-07 NOTE — Telephone Encounter (Signed)
Pt c/o medication issue:  1. Name of Medication: isosorbide mononitrate (IMDUR) 60 MG 24 hr tablet  2. How are you currently taking this medication (dosage and times per day)? 1 tablet daily  3. Are you having a reaction (difficulty breathing--STAT)? no  4. What is your medication issue? Patient states since the medication was increased from 30 mg to 60 mg she is starting to get dizzy when getting up and when bending over. She states she has not passed out and her BP was fine.

## 2020-04-07 NOTE — Telephone Encounter (Signed)
Reiterated to patient to DECREASE IMDUR to 30 mg daily. She will call if symptoms do not improve, angina returns or if BP is consistently above goal. She was grateful for assistance.

## 2020-05-16 NOTE — Progress Notes (Signed)
CARDIOLOGY OFFICE NOTE  Date:  05/17/2020    [name] Date of Birth: [birth year] Medical Record #[id number]  PCP:  Janith Lima, MD  Cardiologist:  Amado Coe   Chief Complaint  Patient presents with  . Follow-up    Seen for Dr. Juliann Pulse, PA    History of Present Illness: [name] [name] is a 79 y.o. female who presents today for a work in visit. Seen for Dr. Burt Knack and Richardson Dopp, PA.   She has a known history of CAD with prior inferior MI in 2007 with DES to RCA, POBA to [facility] and DES to [facility] in 2018 ([facility] in [place], [place]), moderate LAD disease that was negative by FFR, low risk Myoview from March of 2021 and last cath in July of 2021 with DES to [facility]. Other issues include bacterial endocarditis, HTN, HLD, and DM.   Last seen towards the end of July by Richardson Dopp, PA - this was following her most recent PCI - she was felt to be doing well.    Comes in today. Here with her daughter in law today. She is here because of having now low BP. She feels "funny" - "wobbly in the head/lightheaded". She notes she is stumbling. She has been doing better with salt restriction over the past few months. Feels "funny" a few hours after taking her medicines. Her readings are noted - low 549'I systolic - one reading 26/41. No chest pain. Not short of breath.   Past Medical History:  Diagnosis Date  . Allergy   . Anxiety   . CAD (coronary artery disease)    s/p inf MI 2007 - tx w/ DES to RCA // Echo 12/08: EF 60%, mild MR, mild LAE // LHC 08/2009: dLM 20%, LAD beyond diagonal 60-70%, stable from prior catheterization in 2007, ostial circumflex 40-50%, mid RCA stent ok, EF 65% // Myoview 08/2009: EF 76%, small inferoapical fixed defect, no ischemia // Myoview 7/13: no scar or ischemia, EF 69% //    . Cataract    removed both eyes  . Cerebrovascular disease, unspecified   . Chronic kidney disease    frequent Kidney Infections  . Chronic neck pain   .  Chronic pain syndrome   . Diverticular disease   . Dizziness   . DJD (degenerative joint disease)   . DM type 2 (diabetes mellitus, type 2) ([place])   . Echocardiogram    Echo 10/2018: EF 55-60, normal RVSF, mod MAC, mod TR, severe AoV calcification and sclerosis with nodular calcium/mobile area of calcium in the LVOT (small veg vs Lambl's excrescence  - consider TEE), mild AI, mild AS (mean 11).   . Echocardiogram 04/2019   Echocardiogram 04/2019: EF 60-65, basal septal hypertrophy, grade 2 diastolic dysfunction, normal wall motion, normal RV SF, mild LAE, mod MAC, trivial MR, mod sclerosis of the aortic valve with mod aortic annular calcification, thin mobile filamentous structure on ventricular side of AV likely representing Lambl's excrescence, mild AI, mild TR  . GERD (gastroesophageal reflux disease)   . H/O hiatal hernia   . HTN (hypertension)   . IBS (irritable bowel syndrome)   . 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 Internal hemorrhoids   . Ischemic colitis ([place])   . Mitral valve prolapse   . Mixed hyperlipidemia   . Myocardial infarction ([place]) 2007  . Neuromuscular disorder ([place])    hiatal hernia  . Nocturia   . Pancreatitis  1955 an once more  . PONV (postoperative nausea and vomiting)    Difficluty opening mouth wide and turning head. (Cervical Fusion)  . Premature ventricular contractions   . Tubular adenoma of colon   . Ulcer    [facility]  . Urinary incontinence     Past Surgical History:  Procedure Laterality Date  . APPENDECTOMY  1955  . BLADDER REPAIR  2007  . CARDIAC CATHETERIZATION  2007   Stents  . CERVICAL SPINE SURGERY  07/2005   Dr Consuello Masse  . CHOLECYSTECTOMY  2007  . COLONOSCOPY    . CORONARY STENT INTERVENTION N/A 03/09/2020   Procedure: CORONARY STENT INTERVENTION;  Surgeon: Burnell Blanks, MD;  Location: [facility];  Service: Cardiovascular;  Laterality: N/A;  . DENTAL SURGERY  05/2013   replaced an inplant  . EYE  SURGERY  2011   Bilteral  . I & D KNEE WITH POLY EXCHANGE  09/25/2011   Procedure: IRRIGATION AND DEBRIDEMENT KNEE WITH POLY EXCHANGE;  Surgeon: Johnny Bridge, MD;  Location: [facility];  Service: Orthopedics;  Laterality: Left;  . INCISION AND DRAINAGE ABSCESS / HEMATOMA OF BURSA / KNEE / THIGH  09/2011  . JOINT REPLACEMENT  2012   left  . LEFT HEART CATH AND CORONARY ANGIOGRAPHY N/A 03/09/2020   Procedure: LEFT HEART CATH AND CORONARY ANGIOGRAPHY;  Surgeon: Burnell Blanks, MD;  Location: [facility];  Service: Cardiovascular;  Laterality: N/A;  . left Total Knee Replacement  11/2010   Dr Percell Miller  . NECK SURGERY     has had 3 surgeries  . POLYPECTOMY    . POSTERIOR CERVICAL FUSION/FORAMINOTOMY  04/08/2012   Procedure: POSTERIOR CERVICAL FUSION/FORAMINOTOMY LEVEL 2;  Surgeon: Otilio Connors, MD;  Location: [facility];  Service: Neurosurgery;  Laterality: Left;  Left Cervical six-seven Foraminotomy, bilateral cervical seven-thoracic one foraminotomy, cervical six-seven, cervical seven-thoracic one fusion with posterior instrumentation  . TEMPOROMANDIBULAR JOINT SURGERY    . thumb surgery    . TONSILLECTOMY    . TONSILLECTOMY  1950  . TOTAL ABDOMINAL HYSTERECTOMY       Medications: Current Meds  Medication Sig  . Ascorbic Acid (VITAMIN C) 500 MG CAPS Take 500 mg by mouth in the morning and at bedtime.   Marland Kitchen aspirin EC 81 MG tablet Take 81 mg by mouth every morning.   Marland Kitchen atorvastatin (LIPITOR) 80 MG tablet TAKE ONE TABLET BY MOUTH EVERY EVENING  . Cholecalciferol (VITAMIN D3) 1000 UNITS CAPS Take 1,000 Units by mouth daily.   . clopidogrel (PLAVIX) 75 MG tablet Take 1 tablet (75 mg total) by mouth daily.  Marland Kitchen DEXILANT 60 MG capsule TAKE ONE CAPSULE BY MOUTH EVERY MORNING  . dicyclomine (BENTYL) 10 MG capsule TAKE ONE CAPSULE BY MOUTH FOUR TIMES DAILY BEFORE MEALS AND AT BEDTIME  . ezetimibe (ZETIA) 10 MG tablet TAKE ONE TABLET BY MOUTH EVERY MORNING  . gabapentin (NEURONTIN) 300 MG  capsule TAKE TWO CAPSULES BY MOUTH TWICE DAILY  . isosorbide mononitrate (IMDUR) 30 MG 24 hr tablet Take 1 tablet (30 mg total) by mouth daily.  Javier Docker Oil 1000 MG CAPS Take 1,000 mg by mouth daily.  . metoprolol succinate (TOPROL-XL) 50 MG 24 hr tablet Take 1 tablet (50 mg total) by mouth daily.  . nitroGLYCERIN (NITROSTAT) 0.4 MG SL tablet Place 1 tablet (0.4 mg total) under the tongue every 5 (five) minutes as needed for chest pain.  Marland Kitchen olmesartan (BENICAR) 40 MG tablet TAKE  ONE TABLET BY MOUTH ONCE DAILY  . Omega-3 Fatty Acids (FISH OIL) 1000 MG CAPS Take 1 capsule by mouth 2 (two) times daily.   Marland Kitchen venlafaxine XR (EFFEXOR-XR) 37.5 MG 24 hr capsule TAKE ONE CAPSULE BY MOUTH EVERY MORNING WITH BREAKFAST  . [DISCONTINUED] amLODipine (NORVASC) 5 MG tablet Take 1 tablet (5 mg total) by mouth daily.     Allergies: Allergies  Allergen Reactions  . Nitrofurantoin Nausea Only    Severe nausea  . Niacin And Related Other (See Comments)    Must take "Flush-free"  . Ceftriaxone Itching  . Codeine Nausea Only  . Prednisone Itching and Rash  . Sulfonamide Derivatives Itching  . Tetracycline Itching and Rash    Social History: The patient  reports that she has never smoked. She has never used smokeless tobacco. She reports that she does not drink alcohol and does not use drugs.   Family History: The patient's family history includes Cancer - Other in her brother; Colon polyps in her sister; Coronary artery disease in some other family members; Heart disease in her mother; Pancreatic cancer in her mother.   Review of Systems: Please see the history of present illness.   All other systems are reviewed and negative.   Physical Exam: VS:  BP (!) 114/58   Pulse 83   Ht 5' (1.524 m)   Wt 123 lb 9.6 oz (56.1 kg)   SpO2 94%   BMI 24.14 kg/m  .  BMI Body mass index is 24.14 kg/m.  Wt Readings from Last 3 Encounters:  05/17/20 123 lb 9.6 oz (56.1 kg)  03/22/20 124 lb (56.2 kg)  03/10/20  124 lb 9.6 oz (56.5 kg)    General: Pleasant. Well developed, well nourished and in no acute distress.   HEENT: Normal.  Neck: Supple, no JVD, carotid bruits, or masses noted.  Cardiac: Regular rate and rhythm. No murmurs, rubs, or gallops. No edema.  Respiratory:  Lungs are clear to auscultation bilaterally with normal work of breathing.  GI: Soft and nontender.  MS: No deformity or atrophy. Gait and ROM intact.  Skin: Warm and dry. Color is normal.  Neuro:  Strength and sensation are intact and no gross focal deficits noted.  Psych: Alert, appropriate and with normal affect.   LABORATORY DATA:  EKG:  EKG is not ordered today.    Lab Results  Component Value Date   WBC 6.6 03/10/2020   HGB 12.4 03/10/2020   HCT 39.2 03/10/2020   PLT 217 03/10/2020   GLUCOSE 107 (H) 03/10/2020   CHOL 94 04/30/2019   TRIG 85.0 04/30/2019   HDL 50.20 04/30/2019   LDLDIRECT 68.0 03/10/2015   LDLCALC 26 04/30/2019   ALT 18 09/29/2019   AST 20 09/29/2019   NA 139 03/10/2020   K 3.9 03/10/2020   CL 103 03/10/2020   CREATININE 0.75 03/10/2020   BUN 15 03/10/2020   CO2 27 03/10/2020   TSH 2.43 07/15/2018   INR 1.09 01/23/2018   HGBA1C 6.7 (A) 03/03/2020   MICROALBUR 2.6 (H) 07/15/2018     BNP (last 3 results) No results for input(s): BNP in the last 8760 hours.  ProBNP (last 3 results) No results for input(s): PROBNP in the last 8760 hours.   Other Studies Reviewed Today:  CORONARY STENT INTERVENTION 03/2020  LEFT HEART CATH AND CORONARY ANGIOGRAPHY  Conclusion    Mid RCA lesion is 10% stenosed.  Prox RCA to Mid RCA lesion is 95% stenosed.  A drug-eluting stent  was successfully placed using a [id number] [id number].  Post intervention, there is a 0% residual stenosis.  Prox Cx to Mid Cx lesion is 30% stenosed.  Prox LAD to Mid LAD lesion is 50% stenosed.  Mid LAD lesion is 30% stenosed.   1. Large, dominant RCA with patent mid stents. Severe stenosis just prior  to the stented segment in the mid vessel.  2. Moderate non-obstructive proximal LAD stenosis 3. Successful PTCA/DES x 1 mid RCA  Recommendations: Continue DAPT with ASA and Plavix. Continue statin and beta blocker.    ECHO IMPRESSIONS 04/2019  1. The left ventricle has normal systolic function with an ejection  fraction of 60-65%. The cavity size was normal. Moderate Basal septal  hypertrophy. Left ventricular diastolic Doppler parameters are consistent  with pseudonormalization. No evidence of  left ventricular regional wall motion abnormalities.  2. The right ventricle has normal systolic function. The cavity was  normal. There is no increase in right ventricular wall thickness.  3. Left atrial size was mildly dilated.  4. The mitral valve is degenerative. Moderate thickening of the anterior  mitral valve leaflet. Mild calcification of the anterior mitral valve  leaflet. There is moderate mitral annular calcification present. There is  trivial MR.  5. The aortic valve is tricuspid. Moderate sclerosis of the aortic valve  and moderate aortic annular calcification. There is a thin mobile  filamentous structure on the ventricular side of the AV that likely  represents Lambel's excrescence. Aortic valve  regurgitation is mild by color flow Doppler.  6. The aorta is normal unless otherwise noted.   ASSESSMENT & PLAN:    1. Hypotension - symptomatic - she has done better with salt restriction - will stop her Norvasc today. She is to monitor over the next week - if fails to improve - would then cut her Benicar in half.   2. CAD- multiple PCI's to the RCA - last in July of 2021 - no further angina - remains on DAPT.   3. HTN - see above.   4. HLD - on statin and would continue current regimen.    Current medicines are reviewed with the patient today.  The patient does not have concerns regarding medicines other than what has been noted above.  The following changes have been  made:  See above.  Labs/ tests ordered today include:   No orders of the defined types were placed in this encounter.    Disposition:   FU with Dr. Burt Knack as planned in November.   Patient is agreeable to this plan and will call if any problems develop in the interim.   SignedTruitt Merle, NP  05/17/2020 12:06 PM  [facility] [address] [place], [place]  [address] Phone: [phone] Fax: [phone]

## 2020-05-17 ENCOUNTER — Ambulatory Visit: Payer: PPO | Admitting: Nurse Practitioner

## 2020-05-17 ENCOUNTER — Other Ambulatory Visit: Payer: Self-pay

## 2020-05-17 ENCOUNTER — Encounter: Payer: Self-pay | Admitting: Nurse Practitioner

## 2020-05-17 VITALS — BP 114/58 | HR 83 | Ht 60.0 in | Wt 123.6 lb

## 2020-05-17 DIAGNOSIS — Z79899 Other long term (current) drug therapy: Secondary | ICD-10-CM | POA: Diagnosis not present

## 2020-05-17 DIAGNOSIS — I25119 Atherosclerotic heart disease of native coronary artery with unspecified angina pectoris: Secondary | ICD-10-CM | POA: Diagnosis not present

## 2020-05-17 DIAGNOSIS — I10 Essential (primary) hypertension: Secondary | ICD-10-CM

## 2020-05-17 LAB — BASIC METABOLIC PANEL
BUN/Creatinine Ratio: 13 (ref 12–28)
BUN: 12 mg/dL (ref 8–27)
CO2: 29 mmol/L (ref 20–29)
Calcium: 8.9 mg/dL (ref 8.7–10.3)
Chloride: 100 mmol/L (ref 96–106)
Creatinine, Ser: 0.89 mg/dL (ref 0.57–1.00)
GFR calc Af Amer: 71 mL/min/{1.73_m2} (ref >59)
GFR calc non Af Amer: 61 mL/min/{1.73_m2} (ref >59)
Glucose: 174 mg/dL — ABNORMAL HIGH (ref 65–99)
Potassium: 4.8 mmol/L (ref 3.5–5.2)
Sodium: 138 mmol/L (ref 134–144)

## 2020-05-17 LAB — CBC
Hematocrit: 39.1 % (ref 34.0–46.6)
Hemoglobin: 12.8 g/dL (ref 11.1–15.9)
MCH: 31.3 pg (ref 26.6–33.0)
MCHC: 32.7 g/dL (ref 31.5–35.7)
MCV: 96 fL (ref 79–97)
Platelets: 262 10*3/uL (ref 150–450)
RBC: 4.09 x10E6/uL (ref 3.77–5.28)
RDW: 12 % (ref 11.7–15.4)
WBC: 10.3 10*3/uL (ref 3.4–10.8)

## 2020-05-17 NOTE — Patient Instructions (Addendum)
After Visit Summary:  We will be checking the following labs today - BMET & CBC   Medication Instructions:    Continue with your current medicines. BUT  I am stopping the Norvasc today.   If you need a refill on your cardiac medications before your next appointment, please call your pharmacy.     Testing/Procedures To Be Arranged:  N/A  Follow-Up:   See Dr. Burt Knack as planned in November.     At [facility], you and your health needs are our priority.  As part of our continuing mission to provide you with exceptional heart care, we have created designated Provider Care Teams.  These Care Teams include your primary Cardiologist (physician) and Advanced Practice Providers (APPs -  Physician Assistants and Nurse Practitioners) who all work together to provide you with the care you need, when you need it.  Special Instructions:  . Stay safe, wash your hands for at least 20 seconds and wear a mask when needed.  . It was good to talk with you today.  Marland Kitchen Keep a check on your blood pressure - if it does not come up to around 130's in about a week - let us know - we will then cut the Benicar back.    Call the [place] office at [phone] if you have any questions, problems or concerns.

## 2020-05-19 NOTE — Telephone Encounter (Signed)
S/w pt today, pt's bp this am was 136/72  HR 79, stated feeling more like herself today. Will monitor bp for one week than send updated mychart message with bp readings.

## 2020-05-20 ENCOUNTER — Other Ambulatory Visit: Payer: Self-pay | Admitting: Internal Medicine

## 2020-05-20 DIAGNOSIS — I2510 Atherosclerotic heart disease of native coronary artery without angina pectoris: Secondary | ICD-10-CM

## 2020-05-20 DIAGNOSIS — I10 Essential (primary) hypertension: Secondary | ICD-10-CM

## 2020-05-30 ENCOUNTER — Telehealth: Payer: Self-pay | Admitting: Pharmacist

## 2020-05-30 NOTE — Progress Notes (Signed)
Chronic Care Management Pharmacy Assistant   Name: [name] [name]  MRN: [id number] DOB: 1940/10/21  Reason for Encounter: Medication Review  Patient Questions:  1.  Have you seen any other providers since your last visit? Yes, 8/5/ patient saw Sherren Mocha the Cardiologist and he descreased her isosobride from 60 mg to 30 mg. 05/17/20 patint saw Leisa Lenz NP who stopped her Amlodipine due to lightheadiness   2.  Any changes in your medicines or health? Yes   PCP : Janith Lima, MD  Allergies:   Allergies  Allergen Reactions  . Nitrofurantoin Nausea Only    Severe nausea  . Niacin And Related Other (See Comments)    Must take "Flush-free"  . Ceftriaxone Itching  . Codeine Nausea Only  . Prednisone Itching and Rash  . Sulfonamide Derivatives Itching  . Tetracycline Itching and Rash    Medications: Outpatient Encounter Medications as of 05/30/2020  Medication Sig  . Ascorbic Acid (VITAMIN C) 500 MG CAPS Take 500 mg by mouth in the morning and at bedtime.   Marland Kitchen aspirin EC 81 MG tablet Take 81 mg by mouth every morning.   Marland Kitchen atorvastatin (LIPITOR) 80 MG tablet TAKE ONE TABLET BY MOUTH EVERY EVENING  . Cholecalciferol (VITAMIN D3) 1000 UNITS CAPS Take 1,000 Units by mouth daily.   . clopidogrel (PLAVIX) 75 MG tablet Take 1 tablet (75 mg total) by mouth daily.  Marland Kitchen DEXILANT 60 MG capsule TAKE ONE CAPSULE BY MOUTH EVERY MORNING  . dicyclomine (BENTYL) 10 MG capsule TAKE ONE CAPSULE BY MOUTH FOUR TIMES DAILY BEFORE MEALS AND AT BEDTIME  . ezetimibe (ZETIA) 10 MG tablet TAKE ONE TABLET BY MOUTH EVERY MORNING  . gabapentin (NEURONTIN) 300 MG capsule TAKE TWO CAPSULES BY MOUTH TWICE DAILY  . isosorbide mononitrate (IMDUR) 30 MG 24 hr tablet Take 1 tablet (30 mg total) by mouth daily.  Javier Docker Oil 1000 MG CAPS Take 1,000 mg by mouth daily.  . metoprolol succinate (TOPROL-XL) 50 MG 24 hr tablet Take 1 tablet (50 mg total) by mouth daily.  . nitroGLYCERIN (NITROSTAT) 0.4 MG SL tablet  Place 1 tablet (0.4 mg total) under the tongue every 5 (five) minutes as needed for chest pain.  Marland Kitchen olmesartan (BENICAR) 40 MG tablet TAKE ONE TABLET BY MOUTH ONCE DAILY  . Omega-3 Fatty Acids (FISH OIL) 1000 MG CAPS Take 1 capsule by mouth 2 (two) times daily.   Marland Kitchen venlafaxine XR (EFFEXOR-XR) 37.5 MG 24 hr capsule TAKE ONE CAPSULE BY MOUTH EVERY MORNING WITH BREAKFAST   No facility-administered encounter medications on file as of 05/30/2020.    Current Diagnosis: Patient Active Problem List   Diagnosis Date Noted  . Unstable angina ([place])   . Abscess of right lower leg 09/23/2019  . History of bacterial endocarditis 10/28/2018  . Bilateral posterior neck pain 07/20/2015  . Episodic tension-type headache, not intractable 11/08/2014  . Other seasonal allergic rhinitis 07/08/2014  . Diabetic peripheral neuropathy ([place]) 06/29/2014  . Routine general medical examination at a health care facility 01/20/2014  . Other screening mammogram 01/20/2014  . Essential hypertension 08/03/2009  . CAD (coronary artery disease) 01/14/2008  . Controlled diabetes mellitus type 2 with complications ([place]) 22/29/7989  . Hyperlipidemia with target LDL less than 100 06/13/2007  . Depression with anxiety 06/13/2007  . GASTROESOPHAGEAL REFLUX DISEASE 06/13/2007  . DEGENERATIVE JOINT DISEASE 06/13/2007    Goals Addressed   None    Follow-Up:  Coordination of Enhanced  Pharmacy Services  Reviewed chart for medication changes ahead of medication coordination call.   On 04/07/20 the patient saw Sherren Mocha the Cardiologist and he descreased her isosobride from 60 mg to 30 mg. 05/17/20 patint saw Leisa Lenz NP who stopped her Amlodipine due to lightheadiness   BP Readings from Last 3 Encounters:  05/17/20 (!) 114/58  03/22/20 (!) 122/58  03/10/20 140/60    Lab Results  Component Value Date   HGBA1C 6.7 (A) 03/03/2020     Patient obtains medications through Adherence Packaging  90 Days   Last  adherence delivery included:  Dexilant 60 mg  Daily morning Gabapentin 300 mg 2 tabs in the morning and 2 tabs at bedtime Clopidogrel 75 mg daily morning Ezetimbie 10 mg daily morning [place]a Medox 40 mg daily morning Dicyclomine 10 mg 1 tab 4 times daily Venlafaxine 37.5 mg1 cap daily morning Atorvastatin 80 mg daily bedtime  Patient is due for next adherence delivery on:06/06/20. Called patient and reviewed medications and coordinated delivery.  This delivery to include:  Dexilant 60 mg  Daily morning Gabapentin 300 mg 2 tabs in the morning and 2 tabs at bedtime Clopidogrel 75 mg daily morning Ezetimbie 10 mg daily morning Ol[place]a Medox 40 mg daily morning Dicyclomine 10 mg 1 tab 4 times daily Venlafaxine 37.5 mg1 cap daily morning Atorvastatin 80 mg daily bedtime   Patient needs refills for  Dexilant 60 mg  Daily morning Gabapentin 300 mg 2 tabs in the morning and 2 tabs at bedtime Clopidogrel 75 mg daily morning Ezetimbie 10 mg daily morning Ol[place]a Medox 40 mg daily morning Dicyclomine 10 mg 1 tab 4 times daily Venlafaxine 37.5 mg1 cap daily morning Atorvastatin 80 mg daily bedtime   Confirmed delivery date of 06/06/20 advised patient that pharmacy will contact them the morning of delivery.    Rosendo Gros, [place] Pharmacist Assistant  [phone]

## 2020-06-01 ENCOUNTER — Other Ambulatory Visit: Payer: Self-pay | Admitting: Internal Medicine

## 2020-06-01 DIAGNOSIS — I2510 Atherosclerotic heart disease of native coronary artery without angina pectoris: Secondary | ICD-10-CM

## 2020-06-01 DIAGNOSIS — K2100 Gastro-esophageal reflux disease with esophagitis, without bleeding: Secondary | ICD-10-CM

## 2020-06-01 DIAGNOSIS — M542 Cervicalgia: Secondary | ICD-10-CM

## 2020-06-01 DIAGNOSIS — I10 Essential (primary) hypertension: Secondary | ICD-10-CM

## 2020-06-01 DIAGNOSIS — E118 Type 2 diabetes mellitus with unspecified complications: Secondary | ICD-10-CM

## 2020-06-23 ENCOUNTER — Telehealth: Payer: Self-pay | Admitting: Pharmacist

## 2020-06-23 NOTE — Progress Notes (Addendum)
Chronic Care Management Pharmacy Assistant   Name: [name] [name]  MRN: [id number] DOB: March 25, 1941  Reason for Encounter: Medication Review  Patient Questions:  1.  Have you seen any other providers since your last visit? Yes, patient was seen by [name] [name] on 05/17/20 and her amlodipine was discontinued  2.  Any changes in your medicines or health? Yes, [name] amlodipine was discontinued    PCP : Janith Lima, MD  Allergies:   Allergies  Allergen Reactions  . Nitrofurantoin Nausea Only    Severe nausea  . Niacin And Related Other (See Comments)    Must take "Flush-free"  . Ceftriaxone Itching  . Codeine Nausea Only  . Prednisone Itching and Rash  . Sulfonamide Derivatives Itching  . Tetracycline Itching and Rash    Medications: Outpatient Encounter Medications as of 06/23/2020  Medication Sig  . Ascorbic Acid (VITAMIN C) 500 MG CAPS Take 500 mg by mouth in the morning and at bedtime.   Marland Kitchen aspirin EC 81 MG tablet Take 81 mg by mouth every morning.   Marland Kitchen atorvastatin (LIPITOR) 80 MG tablet TAKE ONE TABLET BY MOUTH EVERYDAY AT BEDTIME  . Cholecalciferol (VITAMIN D3) 1000 UNITS CAPS Take 1,000 Units by mouth daily.   . clopidogrel (PLAVIX) 75 MG tablet TAKE ONE TABLET BY MOUTH EVERY MORNING  . DEXILANT 60 MG capsule TAKE ONE TABLET BY MOUTH EVERY MORNING  . dicyclomine (BENTYL) 10 MG capsule TAKE ONE TABLET BY MOUTH FOUR TIMES DAILY  . ezetimibe (ZETIA) 10 MG tablet TAKE ONE TABLET BY MOUTH EVERY MORNING  . gabapentin (NEURONTIN) 300 MG capsule TAKE TWO CAPSULES BY MOUTH AT BREAKFAST AND AT BEDTIME  . isosorbide mononitrate (IMDUR) 30 MG 24 hr tablet Take 1 tablet (30 mg total) by mouth daily.  [name] [name] Oil 1000 MG CAPS Take 1,000 mg by mouth daily.  . metoprolol succinate (TOPROL-XL) 50 MG 24 hr tablet Take 1 tablet (50 mg total) by mouth daily.  . nitroGLYCERIN (NITROSTAT) 0.4 MG SL tablet Place 1 tablet (0.4 mg total) under the tongue every 5 (five) minutes as  needed for chest pain.  Marland Kitchen olmesartan (BENICAR) 40 MG tablet TAKE ONE TABLET BY MOUTH EVERY MORNING  . Omega-3 Fatty Acids (FISH OIL) 1000 MG CAPS Take 1 capsule by mouth 2 (two) times daily.   Marland Kitchen venlafaxine XR (EFFEXOR-XR) 37.5 MG 24 hr capsule TAKE ONE CAPSULE BY MOUTH EVERY MORNING   No facility-administered encounter medications on file as of 06/23/2020.    Current Diagnosis: Patient Active Problem List   Diagnosis Date Noted  . Unstable angina ([place])   . Abscess of right lower leg 09/23/2019  . History of bacterial endocarditis 10/28/2018  . Bilateral posterior neck pain 07/20/2015  . Episodic tension-type headache, not intractable 11/08/2014  . Other seasonal allergic rhinitis 07/08/2014  . Diabetic peripheral neuropathy ([place]) 06/29/2014  . Routine general medical examination at a health care facility 01/20/2014  . Other screening mammogram 01/20/2014  . Essential hypertension 08/03/2009  . CAD (coronary artery disease) 01/14/2008  . Controlled diabetes mellitus type 2 with complications ([place]) 46/50/3546  . Hyperlipidemia with target LDL less than 100 06/13/2007  . Depression with anxiety 06/13/2007  . GASTROESOPHAGEAL REFLUX DISEASE 06/13/2007  . DEGENERATIVE JOINT DISEASE 06/13/2007    Goals Addressed   None     Follow-Up:  Coordination of Enhanced Pharmacy Services  Reviewed chart for medication changes ahead of medication coordination call.  On 05/17/2020 the patient saw [name] Merle, NP  she was taken off of Amlodipine 5mg .   BP Readings from Last 3 Encounters:  05/17/20 (!) 114/58  03/22/20 (!) 122/58  03/10/20 140/60    Lab Results  Component Value Date   HGBA1C 6.7 (A) 03/03/2020     Patient obtains medications through Adherence Packaging  90 Days   Last adherence delivery 06/02/20 included:   Isosorbide Mononitrate Er 30mg ; 1 tab daily-breakfast Metoprolol succinate Er 50 mg; 1 tab daily-breakfast Dexilant 60 mg Dr; 1 tab daily-breakfast Ezetimibe 10  mg; 1 tab daily-breakfast Clopidogrel 75 mg; 1 tab daily-breakfast Atorvastatin 80 mg; 1 tab daily-bedtime olmesartan medoxomil 40 mg- 1 tab daily- breakfast Gabapentin 300 mg; 2 caps every morning and 2 cap every evening Dicyclomine 10 mg; 1 tab 4 x daily- breakfast, lunch, dinner, bedtime Venlafaxine 37.5 Er; 1 cap daily-breakfast   Patient is due for next adherence delivery on:08/30/20 Called patient and reviewed medications and coordinated delivery.  Patient declined the following medications: amlodipine (discontinued by provider)

## 2020-06-26 ENCOUNTER — Other Ambulatory Visit: Payer: Self-pay

## 2020-06-26 ENCOUNTER — Encounter (HOSPITAL_BASED_OUTPATIENT_CLINIC_OR_DEPARTMENT_OTHER): Payer: Self-pay | Admitting: Emergency Medicine

## 2020-06-26 ENCOUNTER — Emergency Department (HOSPITAL_BASED_OUTPATIENT_CLINIC_OR_DEPARTMENT_OTHER): Payer: PPO

## 2020-06-26 ENCOUNTER — Emergency Department (HOSPITAL_BASED_OUTPATIENT_CLINIC_OR_DEPARTMENT_OTHER)
Admission: EM | Admit: 2020-06-26 | Discharge: 2020-06-26 | Disposition: A | Discharge status: Home / Self Care | Payer: PPO | Attending: Emergency Medicine | Admitting: Emergency Medicine

## 2020-06-26 DIAGNOSIS — W541XXA Struck by dog, initial encounter: Secondary | ICD-10-CM | POA: Diagnosis not present

## 2020-06-26 DIAGNOSIS — E1140 Type 2 diabetes mellitus with diabetic neuropathy, unspecified: Secondary | ICD-10-CM | POA: Insufficient documentation

## 2020-06-26 DIAGNOSIS — I129 Hypertensive chronic kidney disease with stage 1 through stage 4 chronic kidney disease, or unspecified chronic kidney disease: Secondary | ICD-10-CM | POA: Diagnosis not present

## 2020-06-26 DIAGNOSIS — Z955 Presence of coronary angioplasty implant and graft: Secondary | ICD-10-CM | POA: Insufficient documentation

## 2020-06-26 DIAGNOSIS — I2510 Atherosclerotic heart disease of native coronary artery without angina pectoris: Secondary | ICD-10-CM | POA: Diagnosis not present

## 2020-06-26 DIAGNOSIS — S5012XA Contusion of left forearm, initial encounter: Secondary | ICD-10-CM | POA: Diagnosis not present

## 2020-06-26 DIAGNOSIS — Z7982 Long term (current) use of aspirin: Secondary | ICD-10-CM | POA: Insufficient documentation

## 2020-06-26 DIAGNOSIS — Z79899 Other long term (current) drug therapy: Secondary | ICD-10-CM | POA: Diagnosis not present

## 2020-06-26 DIAGNOSIS — S59912A Unspecified injury of left forearm, initial encounter: Secondary | ICD-10-CM | POA: Diagnosis present

## 2020-06-26 DIAGNOSIS — N189 Chronic kidney disease, unspecified: Secondary | ICD-10-CM | POA: Insufficient documentation

## 2020-06-26 DIAGNOSIS — Z7901 Long term (current) use of anticoagulants: Secondary | ICD-10-CM | POA: Insufficient documentation

## 2020-06-26 NOTE — Discharge Instructions (Addendum)
You were seen in the emergency department for evaluation of a large bruise to your forearm after the dog jumped on you.  You were offered an x-ray but you did not feel it was broken.  There is likely a hematoma or bleeding underneath the skin.  We wrapped her arm up with an Ace wrap to put some pressure on that.  Please ice this area.  Follow-up with your doctor.  Return to the emergency department if any worsening or concerning symptoms

## 2020-06-26 NOTE — ED Provider Notes (Signed)
[facility] EMERGENCY DEPARTMENT Provider Note   CSN: [id number] Arrival date & time: 06/26/20  1820     History Chief Complaint  Patient presents with  . Arm Injury    [name] [name] is a 79 y.o. female.  She is on Plavix for her coronary disease.  She said her neighbors dogs jumped up to greet her and hit her in the forearm with their paws.  No bite.  Since then she has had some soreness in her forearm and a large hematoma.  No other injuries.  No numbness or weakness.  Pain is worse with movement of her hand.  No open wounds.  No fall.  The history is provided by the patient.  Arm Injury Location:  Arm Arm location:  L forearm Injury: yes   Mechanism of injury comment:  Direct blow Pain details:    Quality:  Throbbing   Radiates to:  Does not radiate   Severity:  Moderate   Onset quality:  Sudden   Timing:  Constant   Progression:  Unchanged Handedness:  Right-handed Dislocation: no   Foreign body present:  No foreign bodies Relieved by:  None tried Worsened by:  Movement Ineffective treatments:  None tried Associated symptoms: swelling   Associated symptoms: no fever, no muscle weakness and no numbness        Past Medical History:  Diagnosis Date  . Allergy   . Anxiety   . CAD (coronary artery disease)    s/p inf MI 2007 - tx w/ DES to RCA // Echo 12/08: EF 60%, mild MR, mild LAE // LHC 08/2009: dLM 20%, LAD beyond diagonal 60-70%, stable from prior catheterization in 2007, ostial circumflex 40-50%, mid RCA stent ok, EF 65% // Myoview 08/2009: EF 76%, small inferoapical fixed defect, no ischemia // Myoview 7/13: no scar or ischemia, EF 69% //    . Cataract    removed both eyes  . Cerebrovascular disease, unspecified   . Chronic kidney disease    frequent Kidney Infections  . Chronic neck pain   . Chronic pain syndrome   . Diverticular disease   . Dizziness   . DJD (degenerative joint disease)   . DM type 2 (diabetes mellitus, type 2) ([place])   .  Echocardiogram    Echo 10/2018: EF 55-60, normal RVSF, mod MAC, mod TR, severe AoV calcification and sclerosis with nodular calcium/mobile area of calcium in the LVOT (small veg vs Lambl's excrescence  - consider TEE), mild AI, mild AS (mean 11).   . Echocardiogram 04/2019   Echocardiogram 04/2019: EF 60-65, basal septal hypertrophy, grade 2 diastolic dysfunction, normal wall motion, normal RV SF, mild LAE, mod MAC, trivial MR, mod sclerosis of the aortic valve with mod aortic annular calcification, thin mobile filamentous structure on ventricular side of AV likely representing Lambl's excrescence, mild AI, mild TR  . GERD (gastroesophageal reflux disease)   . H/O hiatal hernia   . HTN (hypertension)   . IBS (irritable bowel syndrome)   . 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 Internal hemorrhoids   . Ischemic colitis ([place])   . Mitral valve prolapse   . Mixed hyperlipidemia   . Myocardial infarction ([place]) 2007  . Neuromuscular disorder ([place])    hiatal hernia  . Nocturia   . Pancreatitis    1955 an once more  . PONV (postoperative nausea and vomiting)    Difficluty opening mouth wide and turning head. (Cervical Fusion)  . Premature ventricular  contractions   . Tubular adenoma of colon   . Ulcer    [name]  . Urinary incontinence     Patient Active Problem List   Diagnosis Date Noted  . Unstable angina ([name])   . Abscess of right lower leg 09/23/2019  . History of bacterial endocarditis 10/28/2018  . Bilateral posterior neck pain 07/20/2015  . Episodic tension-type headache, not intractable 11/08/2014  . Other seasonal allergic rhinitis 07/08/2014  . Diabetic peripheral neuropathy ([name]) 06/29/2014  . Routine general medical examination at a health care facility 01/20/2014  . Other screening mammogram 01/20/2014  . Essential hypertension 08/03/2009  . CAD (coronary artery disease) 01/14/2008  . Controlled diabetes mellitus type 2 with complications ([name])  32/99/2426  . Hyperlipidemia with target LDL less than 100 06/13/2007  . Depression with anxiety 06/13/2007  . GASTROESOPHAGEAL REFLUX DISEASE 06/13/2007  . DEGENERATIVE JOINT DISEASE 06/13/2007    Past Surgical History:  Procedure Laterality Date  . APPENDECTOMY  1955  . BLADDER REPAIR  2007  . CARDIAC CATHETERIZATION  2007   Stents  . CERVICAL SPINE SURGERY  07/2005   Dr Consuello Masse  . CHOLECYSTECTOMY  2007  . COLONOSCOPY    . CORONARY STENT INTERVENTION N/A 03/09/2020   Procedure: CORONARY STENT INTERVENTION;  Surgeon: Burnell Blanks, MD;  Location: [facility];  Service: Cardiovascular;  Laterality: N/A;  . DENTAL SURGERY  05/2013   replaced an inplant  . EYE SURGERY  2011   Bilteral  . I & D KNEE WITH POLY EXCHANGE  09/25/2011   Procedure: IRRIGATION AND DEBRIDEMENT KNEE WITH POLY EXCHANGE;  Surgeon: Johnny Bridge, MD;  Location: [facility];  Service: Orthopedics;  Laterality: Left;  . INCISION AND DRAINAGE ABSCESS / HEMATOMA OF BURSA / KNEE / THIGH  09/2011  . JOINT REPLACEMENT  2012   left  . LEFT HEART CATH AND CORONARY ANGIOGRAPHY N/A 03/09/2020   Procedure: LEFT HEART CATH AND CORONARY ANGIOGRAPHY;  Surgeon: Burnell Blanks, MD;  Location: [facility];  Service: Cardiovascular;  Laterality: N/A;  . left Total Knee Replacement  11/2010   Dr Percell Miller  . NECK SURGERY     has had 3 surgeries  . POLYPECTOMY    . POSTERIOR CERVICAL FUSION/FORAMINOTOMY  04/08/2012   Procedure: POSTERIOR CERVICAL FUSION/FORAMINOTOMY LEVEL 2;  Surgeon: Otilio Connors, MD;  Location: [facility];  Service: Neurosurgery;  Laterality: Left;  Left Cervical six-seven Foraminotomy, bilateral cervical seven-thoracic one foraminotomy, cervical six-seven, cervical seven-thoracic one fusion with posterior instrumentation  . TEMPOROMANDIBULAR JOINT SURGERY    . thumb surgery    . TONSILLECTOMY    . TONSILLECTOMY  1950  . TOTAL ABDOMINAL HYSTERECTOMY       OB History   No obstetric  history on file.     Family History  Problem Relation Age of Onset  . Heart disease Mother   . Pancreatic cancer Mother   . Cancer - Other Brother        vocal cord cancer  . Colon polyps Sister   . Coronary artery disease Other        sibling  . Coronary artery disease Other        sibling  . Colon cancer Neg Hx   . Esophageal cancer Neg Hx   . Rectal cancer Neg Hx   . Stomach cancer Neg Hx     Social History   Tobacco Use  . Smoking status: Never Smoker  . Smokeless  tobacco: Never Used  Vaping Use  . Vaping Use: Never used  Substance Use Topics  . Alcohol use: No  . Drug use: No    Home Medications Prior to Admission medications   Medication Sig Start Date End Date Taking? Authorizing Provider  Ascorbic Acid (VITAMIN C) 500 MG CAPS Take 500 mg by mouth in the morning and at bedtime.     [provider]  aspirin EC 81 MG tablet Take 81 mg by mouth every morning.     [provider]  atorvastatin (LIPITOR) 80 MG tablet TAKE ONE TABLET BY MOUTH EVERYDAY AT BEDTIME 06/01/20   Janith Lima, MD  Cholecalciferol (VITAMIN D3) 1000 UNITS CAPS Take 1,000 Units by mouth daily.     [provider]  clopidogrel (PLAVIX) 75 MG tablet TAKE ONE TABLET BY MOUTH EVERY MORNING 06/01/20   Janith Lima, MD  DEXILANT 60 MG capsule TAKE ONE TABLET BY MOUTH EVERY MORNING 06/01/20   Janith Lima, MD  dicyclomine (BENTYL) 10 MG capsule TAKE ONE TABLET BY MOUTH FOUR TIMES DAILY 06/01/20   Janith Lima, MD  ezetimibe (ZETIA) 10 MG tablet TAKE ONE TABLET BY MOUTH EVERY MORNING 06/01/20   Janith Lima, MD  gabapentin (NEURONTIN) 300 MG capsule TAKE TWO CAPSULES BY MOUTH AT [facility] AND AT BEDTIME 06/01/20   Janith Lima, MD  isosorbide mononitrate (IMDUR) 30 MG 24 hr tablet Take 1 tablet (30 mg total) by mouth daily. 04/07/20 04/02/21  Richardson Dopp T, PA-C  Krill Oil 1000 MG CAPS Take 1,000 mg by mouth daily.    [provider]  metoprolol succinate  (TOPROL-XL) 50 MG 24 hr tablet Take 1 tablet (50 mg total) by mouth daily. 12/08/19   Sherren Mocha, MD  nitroGLYCERIN (NITROSTAT) 0.4 MG SL tablet Place 1 tablet (0.4 mg total) under the tongue every 5 (five) minutes as needed for chest pain. 03/11/20   Janith Lima, MD  olmesartan (BENICAR) 40 MG tablet TAKE ONE TABLET BY MOUTH EVERY MORNING 06/01/20   Janith Lima, MD  Omega-3 Fatty Acids (FISH OIL) 1000 MG CAPS Take 1 capsule by mouth 2 (two) times daily.     [provider]  venlafaxine XR (EFFEXOR-XR) 37.5 MG 24 hr capsule TAKE ONE CAPSULE BY MOUTH EVERY MORNING 06/01/20   Janith Lima, MD    Allergies    Nitrofurantoin, Niacin and related, Ceftriaxone, Codeine, Prednisone, Sulfonamide derivatives, and Tetracycline  Review of Systems   Review of Systems  Constitutional: Negative for fever.  Skin: Positive for wound.  Neurological: Positive for weakness and numbness.    Physical Exam Updated Vital Signs BP (!) 184/74 (BP Location: Right Arm)   Pulse 75   Temp 98.3 F (36.8 C) (Oral)   Resp 16   Ht 5' (1.524 m)   Wt 57 kg   SpO2 93%   BMI 24.54 kg/m   Physical Exam Constitutional:      Appearance: Normal appearance. She is well-developed.  HENT:     Head: Normocephalic and atraumatic.  Eyes:     Conjunctiva/sclera: Conjunctivae normal.  Musculoskeletal:        General: Swelling and signs of injury present. Normal range of motion.     Cervical back: Neck supple.     Comments: She is approximately 4 cm hematoma in the middle of her left forearm.  Its raised up about a centimeter.  She has no bony tenderness and full range of motion at her  elbow and wrist.  No pain with axial loading.  Distal neurovascular intact.  No open wounds.  Skin:    General: Skin is warm and dry.     Capillary Refill: Capillary refill takes less than 2 seconds.  Neurological:     Mental Status: She is alert.     GCS: GCS eye subscore is 4. GCS verbal subscore is 5. GCS motor  subscore is 6.     ED Results / Procedures / Treatments   Labs (all labs ordered are listed, but only abnormal results are displayed) Labs Reviewed - No data to display  EKG None  Radiology No results found.  Procedures Procedures (including critical care time)  Medications Ordered in ED Medications - No data to display  ED Course  I have reviewed the triage vital signs and the nursing notes.  Pertinent labs & imaging results that were available during my care of the patient were reviewed by me and considered in my medical decision making (see chart for details).    MDM Rules/Calculators/A&P                        Patient with contusion to left forearm.  Doubt fracture and do not think she needs any imaging.  No skin break so do not think she needs antibiotics currently.  Put her in a compressive dressing and recommended using ice to the affected area.  Counseled on observation to watch for signs of infection.  Return instructions discussed.  Final Clinical Impression(s) / ED Diagnoses Final diagnoses:  Traumatic hematoma of left forearm, initial encounter    Rx / DC Orders ED Discharge Orders    None       Hayden Rasmussen, MD 06/27/20 1014

## 2020-06-26 NOTE — ED Triage Notes (Signed)
Pt had several dogs jump on her arm today and now has a large  Hematoma. She takes plavix.

## 2020-06-27 ENCOUNTER — Encounter: Payer: Self-pay | Admitting: Internal Medicine

## 2020-06-27 ENCOUNTER — Ambulatory Visit (INDEPENDENT_AMBULATORY_CARE_PROVIDER_SITE_OTHER): Payer: PPO | Admitting: Internal Medicine

## 2020-06-27 VITALS — BP 156/82 | HR 73 | Temp 98.3°F | Ht 60.0 in | Wt 125.0 lb

## 2020-06-27 DIAGNOSIS — S5012XD Contusion of left forearm, subsequent encounter: Secondary | ICD-10-CM | POA: Diagnosis not present

## 2020-06-27 DIAGNOSIS — S5012XA Contusion of left forearm, initial encounter: Secondary | ICD-10-CM | POA: Insufficient documentation

## 2020-06-27 DIAGNOSIS — Z23 Encounter for immunization: Secondary | ICD-10-CM | POA: Diagnosis not present

## 2020-06-27 MED ORDER — PROMETHAZINE HCL 12.5 MG PO TABS: 12.5000 mg | ORAL_TABLET | Freq: Four times a day (QID) | ORAL | 0 refills | Status: DC | PRN

## 2020-06-27 MED ORDER — HYDROCODONE-ACETAMINOPHEN 5-325 MG PO TABS: 1.0000 | ORAL_TABLET | Freq: Four times a day (QID) | ORAL | 0 refills | Status: AC | PRN

## 2020-06-27 NOTE — Progress Notes (Signed)
Subjective:  Patient ID: [name] [name], female    DOB: 1941/02/16  Age: 79 y.o. MRN: [id number]  CC: Arm Injury  This visit occurred during the SARS-CoV-2 public health emergency.  Safety protocols were in place, including screening questions prior to the visit, additional usage of staff PPE, and extensive cleaning of exam room while observing appropriate contact time as indicated for disinfecting solutions.    HPI [name] [name] presents for f/up - About 24 hours ago her dog jumped on her left forearm causing a glancing injury.  She was seen in the ED and found to have a traumatic hematoma/contusion.  She is not taking anything for pain and says the pain kept her awake all night and has interfered with her ability to rest.  There was swelling over the forearm but she says that has resolved.  She denies numbness, weakness, tingling, or discoloration in her left hand.  She is applying ice and keeping it elevated.  Outpatient Medications Prior to Visit  Medication Sig Dispense Refill  . Ascorbic Acid (VITAMIN C) 500 MG CAPS Take 500 mg by mouth in the morning and at bedtime.     Marland Kitchen aspirin EC 81 MG tablet Take 81 mg by mouth every morning.     Marland Kitchen atorvastatin (LIPITOR) 80 MG tablet TAKE ONE TABLET BY MOUTH EVERYDAY AT BEDTIME 90 tablet 0  . Cholecalciferol (VITAMIN D3) 1000 UNITS CAPS Take 1,000 Units by mouth daily.     . clopidogrel (PLAVIX) 75 MG tablet TAKE ONE TABLET BY MOUTH EVERY MORNING 90 tablet 0  . DEXILANT 60 MG capsule TAKE ONE TABLET BY MOUTH EVERY MORNING 90 capsule 0  . dicyclomine (BENTYL) 10 MG capsule TAKE ONE TABLET BY MOUTH FOUR TIMES DAILY 360 capsule 0  . ezetimibe (ZETIA) 10 MG tablet TAKE ONE TABLET BY MOUTH EVERY MORNING 90 tablet 0  . gabapentin (NEURONTIN) 300 MG capsule TAKE TWO CAPSULES BY MOUTH AT BREAKFAST AND AT BEDTIME 360 capsule 0  . isosorbide mononitrate (IMDUR) 30 MG 24 hr tablet Take 1 tablet (30 mg total) by mouth daily. 90 tablet 3  . Krill Oil 1000  MG CAPS Take 1,000 mg by mouth daily.    . metoprolol succinate (TOPROL-XL) 50 MG 24 hr tablet Take 1 tablet (50 mg total) by mouth daily. 90 tablet 3  . nitroGLYCERIN (NITROSTAT) 0.4 MG SL tablet Place 1 tablet (0.4 mg total) under the tongue every 5 (five) minutes as needed for chest pain. 25 tablet 2  . olmesartan (BENICAR) 40 MG tablet TAKE ONE TABLET BY MOUTH EVERY MORNING 90 tablet 0  . Omega-3 Fatty Acids (FISH OIL) 1000 MG CAPS Take 1 capsule by mouth 2 (two) times daily.     Marland Kitchen venlafaxine XR (EFFEXOR-XR) 37.5 MG 24 hr capsule TAKE ONE CAPSULE BY MOUTH EVERY MORNING 90 capsule 0   No facility-administered medications prior to visit.    ROS Review of Systems  Constitutional: Negative.  Negative for chills, fatigue and fever.  HENT: Negative.   Eyes: Negative for visual disturbance.  Respiratory: Negative for cough, chest tightness and wheezing.   Cardiovascular: Negative for chest pain, palpitations and leg swelling.  Gastrointestinal: Negative for abdominal pain.  Endocrine: Negative.   Genitourinary: Negative.  Negative for difficulty urinating.  Musculoskeletal: Positive for arthralgias. Negative for myalgias.  Skin: Negative.  Negative for wound.  Neurological: Negative.  Negative for dizziness and weakness.  Hematological: Negative for adenopathy. Does not bruise/bleed easily.  Psychiatric/Behavioral: Negative.  All other systems reviewed and are negative.   Objective:  BP (!) 156/82   Pulse 73   Temp 98.3 F (36.8 C) (Oral)   Ht 5' (1.524 m)   Wt 125 lb (56.7 kg)   SpO2 93%   BMI 24.41 kg/m   BP Readings from Last 3 Encounters:  06/27/20 (!) 156/82  06/26/20 (!) 184/74  05/17/20 (!) 114/58    Wt Readings from Last 3 Encounters:  06/27/20 125 lb (56.7 kg)  06/26/20 125 lb 10.6 oz (57 kg)  05/17/20 123 lb 9.6 oz (56.1 kg)    Physical Exam Vitals reviewed.  Constitutional:      Appearance: Normal appearance. She is not ill-appearing.  HENT:     Nose:  Nose normal.  Eyes:     General: No scleral icterus.    Conjunctiva/sclera: Conjunctivae normal.  Cardiovascular:     Rate and Rhythm: Normal rate and regular rhythm.     Heart sounds: No murmur heard.   Pulmonary:     Effort: Pulmonary effort is normal.     Breath sounds: No stridor. No wheezing, rhonchi or rales.  Abdominal:     General: Abdomen is flat.     Palpations: There is no mass.     Tenderness: There is no abdominal tenderness. There is no guarding.  Musculoskeletal:        General: Normal range of motion.     Left forearm: Swelling and tenderness present. No deformity, lacerations or bony tenderness.     Cervical back: Neck supple.     Right lower leg: No edema.     Left lower leg: No edema.     Comments: Diffusely over the left forearm there are faint areas of ecchymosis and mild swelling.  There are no puncture wounds, lacerations, or foreign bodies.  There is free range of motion in the elbow and wrist.  Distally there is good sensation and capillary refill.  Left radial pulses 1+ and equal to the right radial pulse.  Lymphadenopathy:     Cervical: No cervical adenopathy.  Skin:    General: Skin is warm and dry.  Neurological:     General: No focal deficit present.     Mental Status: She is alert.  Psychiatric:        Mood and Affect: Mood normal.        Behavior: Behavior normal.     Lab Results  Component Value Date   WBC 10.3 05/17/2020   HGB 12.8 05/17/2020   HCT 39.1 05/17/2020   PLT 262 05/17/2020   GLUCOSE 174 (H) 05/17/2020   CHOL 94 04/30/2019   TRIG 85.0 04/30/2019   HDL 50.20 04/30/2019   LDLDIRECT 68.0 03/10/2015   LDLCALC 26 04/30/2019   ALT 18 09/29/2019   AST 20 09/29/2019   NA 138 05/17/2020   K 4.8 05/17/2020   CL 100 05/17/2020   CREATININE 0.89 05/17/2020   BUN 12 05/17/2020   CO2 29 05/17/2020   TSH 2.43 07/15/2018   INR 1.09 01/23/2018   HGBA1C 6.7 (A) 03/03/2020   MICROALBUR 2.6 (H) 07/15/2018    No results  found.  Assessment & Plan:   Rossy was seen today for arm injury.  Diagnoses and all orders for this visit:  Flu vaccine need -     Flu Vaccine QUAD High Dose(Fluad)  Contusion of left forearm, subsequent encounter- She has pain that interferes with her sleep.  I recommended that she try hydrocodone and acetaminophen as  needed.  She has a history of nausea caused by codeine.  I recommended she take promethazine if the hydrocodone causes any nausea.  She agrees to continue to apply ice and to keep the left hand elevated. -     HYDROcodone-acetaminophen (NORCO/VICODIN) 5-325 MG tablet; Take 1 tablet by mouth every 6 (six) hours as needed for up to 5 days for moderate pain. -     promethazine (PHENERGAN) 12.5 MG tablet; Take 1 tablet (12.5 mg total) by mouth every 6 (six) hours as needed for nausea or vomiting.   I am having Jan[place] "FAYE" start on HYDROcodone-acetaminophen and promethazine. I am also having her maintain her Fish Oil, Vitamin D3, Vitamin C, aspirin EC, metoprolol succinate, Krill Oil, nitroGLYCERIN, isosorbide mononitrate, ezetimibe, Dexilant, olmesartan, gabapentin, dicyclomine, clopidogrel, venlafaxine XR, and atorvastatin.  Meds ordered this encounter  Medications  . HYDROcodone-acetaminophen (NORCO/VICODIN) 5-325 MG tablet    Sig: Take 1 tablet by mouth every 6 (six) hours as needed for up to 5 days for moderate pain.    Dispense:  15 tablet    Refill:  0  . promethazine (PHENERGAN) 12.5 MG tablet    Sig: Take 1 tablet (12.5 mg total) by mouth every 6 (six) hours as needed for nausea or vomiting.    Dispense:  15 tablet    Refill:  0     Follow-up: Return if symptoms worsen or fail to improve.  [name], MD

## 2020-06-27 NOTE — Patient Instructions (Signed)
Contusion A contusion is a deep bruise. Contusions are the result of a blunt injury to tissues and muscle fibers under the skin. The injury causes bleeding under the skin. The skin overlying the contusion may turn blue, purple, or yellow. Minor injuries will give you a painless contusion, but more severe injuries cause contusions that may stay painful and swollen for a few weeks. Follow these instructions at home: Pay attention to any changes in your symptoms. Let your health care provider know about them. Take these actions to relieve your pain. Managing pain, stiffness, and swelling   Use resting, icing, applying pressure (compression), and raising (elevating) the injured area. This is often called the RICE strategy. ? Rest the injured area. Return to your normal activities as told by your health care provider. Ask your health care provider what activities are safe for you. ? If directed, put ice on the injured area:  Put ice in a plastic bag.  Place a towel between your skin and the bag.  Leave the ice on for 20 minutes, 2-3 times per day. ? If directed, apply light compression to the injured area using an elastic bandage. Make sure the bandage is not wrapped too tightly. Remove and reapply the bandage as directed by your health care provider. ? If possible, raise (elevate) the injured area above the level of your heart while you are sitting or lying down. General instructions  Take over-the-counter and prescription medicines only as told by your health care provider.  Keep all follow-up visits as told by your health care provider. This is important. Contact a health care provider if:  Your symptoms do not improve after several days of treatment.  Your symptoms get worse.  You have difficulty moving the injured area. Get help right away if:  You have severe pain.  You have numbness in a hand or foot.  Your hand or foot turns pale or cold. Summary  A contusion is a deep  bruise.  Contusions are the result of a blunt injury to tissues and muscle fibers under the skin.  It is treated with rest, ice, compression, and elevation. You may be given over-the-counter medicines for pain.  Contact a health care provider if your symptoms do not improve, or get worse.  Get help right away if you have severe pain, have numbness, or the area turns pale or cold. This information is not intended to replace advice given to you by your health care provider. Make sure you discuss any questions you have with your health care provider. Document Revised: 04/10/2018 Document Reviewed: 04/10/2018 Elsevier Patient Education  [employer].

## 2020-07-08 ENCOUNTER — Other Ambulatory Visit: Payer: Self-pay

## 2020-07-08 ENCOUNTER — Telehealth: Payer: Self-pay | Admitting: Pharmacist

## 2020-07-08 ENCOUNTER — Ambulatory Visit: Payer: PPO | Admitting: Pharmacist

## 2020-07-08 DIAGNOSIS — I25119 Atherosclerotic heart disease of native coronary artery with unspecified angina pectoris: Secondary | ICD-10-CM

## 2020-07-08 DIAGNOSIS — E785 Hyperlipidemia, unspecified: Secondary | ICD-10-CM

## 2020-07-08 DIAGNOSIS — I10 Essential (primary) hypertension: Secondary | ICD-10-CM

## 2020-07-08 DIAGNOSIS — E118 Type 2 diabetes mellitus with unspecified complications: Secondary | ICD-10-CM

## 2020-07-08 NOTE — Chronic Care Management (AMB) (Signed)
Chronic Care Management Pharmacy  Name: [name] [name]  MRN: [id number] DOB: 23-Feb-1941   Chief Complaint/ HPI  [name] [name],  79 y.o. , female presents for their Follow-Up CCM visit with the clinical pharmacist via telephone due to COVID-19 Pandemic.  PCP : Janith Lima, MD  Their chronic conditions include: HTN, CAD (MI 2007), T2DM, GERD, HLD, depression/anxiety, DJD   Pt has moved in with daughter who is a Marine scientist, son-in-law is a retired Theme park manager. Last week they discovered black mold so she is in process of moving to an apartment, currently staying with a relative until apt becomes available.   Office Visits: 06/27/20 Dr Ronnald Ramp OV: ED f/u, rx'd Norco and promethazine  03/03/20 Dr Ronnald Ramp OV: A1c controlled, no medication indicated. Continue same meds.  09/29/19 Dr Ronnald Ramp OV: wound check for abscess on RLE, resolving. No med changes.   09/23/19 Dr Ronnald Ramp OV: blunt trauma to RLE, I&D of hematoma in office.  04/30/19 Dr Ronnald Ramp OV: labs stable, no med changes.  Consult Visit: 06/26/20 ED visit: arm injury due to dog jumping to greet her.  05/17/20 NP Truitt Merle (cardiology): stop amlodipine due to hypotension.  04/07/20 pt reports dizziness with higher dose Imdur, advised to reduce dose to 30 mg again.  03/22/20 PA Richardson Dopp (cardiology): hospital f/u, stable, no med changes.  03/09/20 admission for cardiac cath: DES x 1 mid RCA, rec'd ASA + Plavix for 1 year.  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 Pt c/o anginal sx, rec'd cardiac cath for following week. Increased isosorbide to 60 mg  11/30/19 PA Weaver (cardiology): recent chest pain, Myoview negative ischemia, pt c/o palpitations. Increased metoprolol to 50 mg.   05/20/19 Dr Hilarie Fredrickson (GI): internal hemorrhoids - rec'd OTC prep H w/ hydrocortisone suppositories x 5 nights.   Medications: Outpatient Encounter Medications as of 07/08/2020  Medication Sig  . Ascorbic Acid (VITAMIN C) 500 MG CAPS  Take 500 mg by mouth in the morning and at bedtime.   Marland Kitchen aspirin EC 81 MG tablet Take 81 mg by mouth every morning.   Marland Kitchen atorvastatin (LIPITOR) 80 MG tablet TAKE ONE TABLET BY MOUTH EVERYDAY AT BEDTIME  . Cholecalciferol (VITAMIN D3) 1000 UNITS CAPS Take 1,000 Units by mouth daily.   . clopidogrel (PLAVIX) 75 MG tablet TAKE ONE TABLET BY MOUTH EVERY MORNING  . DEXILANT 60 MG capsule TAKE ONE TABLET BY MOUTH EVERY MORNING  . dicyclomine (BENTYL) 10 MG capsule TAKE ONE TABLET BY MOUTH FOUR TIMES DAILY  . ezetimibe (ZETIA) 10 MG tablet TAKE ONE TABLET BY MOUTH EVERY MORNING  . gabapentin (NEURONTIN) 300 MG capsule TAKE TWO CAPSULES BY MOUTH AT BREAKFAST AND AT BEDTIME  . isosorbide mononitrate (IMDUR) 30 MG 24 hr tablet Take 1 tablet (30 mg total) by mouth daily.  Javier Docker Oil 1000 MG CAPS Take 1,000 mg by mouth daily.  . metoprolol succinate (TOPROL-XL) 50 MG 24 hr tablet Take 1 tablet (50 mg total) by mouth daily.  . nitroGLYCERIN (NITROSTAT) 0.4 MG SL tablet Place 1 tablet (0.4 mg total) under the tongue every 5 (five) minutes as needed for chest pain.  Marland Kitchen olmesartan (BENICAR) 40 MG tablet TAKE ONE TABLET BY MOUTH EVERY MORNING  . Omega-3 Fatty Acids (FISH OIL) 1000 MG CAPS Take 1 capsule by mouth 2 (two) times daily.   . promethazine (PHENERGAN) 12.5 MG tablet Take 1 tablet (12.5 mg total) by mouth every 6 (six) hours as needed for nausea or  vomiting.  . venlafaxine XR (EFFEXOR-XR) 37.5 MG 24 hr capsule TAKE ONE CAPSULE BY MOUTH EVERY MORNING   No facility-administered encounter medications on file as of 07/08/2020.     Current Diagnosis/Assessment:    Goals Addressed            This Visit's Progress   . Pharmacy Care Plan       CARE PLAN ENTRY (see longitudinal plan of care for additional care plan information)  Current Barriers:  . Chronic Disease Management support, education, and care coordination needs related to Hypertension, Hyperlipidemia, Diabetes, and Coronary Artery  Disease   Hypertension BP Readings from Last 3 Encounters:  06/27/20 (!) 156/82  06/26/20 (!) 184/74  05/17/20 (!) 114/58 .  Pharmacist Clinical Goal(s): o Over the next 180 days, patient will work with PharmD and providers to maintain BP goal <140/90 . Current regimen:  o metoprolol succinate 50 mg daily,  o olmesartan 40 mg daily, o isosorbide MN 30 mg daily . Interventions: o Discussed BP goals and benefits of medication for prevention of heart attack / stroke . Patient self care activities - Over the next 180 days, patient will: o Check BP daily, document, and provide at future appointments o Ensure daily salt intake < 2300 mg/day  Hyperlipidemia / Coronary artery disease Lab Results  Component Value Date/Time   LDLCALC 26 04/30/2019 11:34 AM   LDLDIRECT 68.0 03/10/2015 03:30 PM .  Pharmacist Clinical Goal(s): o Over the next 180 days, patient will work with PharmD and providers to maintain LDL goal < 70 . Current regimen:  o atorvastatin 80 mg daily o ezetimibe 10 mg daily,  o nitroglycerin 0.4 mg as needed o aspirin 81 mg daily,  o clopidogrel 75 mg daily,  o fish oil OTC  . Interventions: o Discussed cholesterol goals and benefits of medication for prevention of heart attack / stroke . Patient self care activities - Over the next 180 days, patient will: o Continue medication as prescribed o Use Nitroglycerin as needed for chest pain; if pain is not relieved by 3 doses 5 min apart, seek medical attention  Diabetes Lab Results  Component Value Date/Time   HGBA1C 6.7 (A) 03/03/2020 01:44 PM   HGBA1C 6.6 (H) 09/29/2019 02:36 PM   HGBA1C 6.5 04/30/2019 11:34 AM .  Pharmacist Clinical Goal(s): o Over the next 180 days, patient will work with PharmD and providers to maintain A1c goal <7% . Current regimen:  o No medications needed . Interventions: o Discussed A1c goals and benefits of diet/exercise for prevention of diabetic complications. Patient is doing well off  of metformin. . Patient self care activities - Over the next 180 days, patient will: o Continue low-sugar diet and exercise routine  Medication management . Pharmacist Clinical Goal(s): o Over the next 180 days, patient will work with PharmD and providers to achieve optimal medication adherence . Current pharmacy: [facility] . Interventions o Comprehensive medication review performed. o Utilize [facility] pharmacy for medication synchronization, packaging and delivery . Patient self care activities - Over the next 180 days, patient will: o Focus on medication adherence by pill pack o Take medications as prescribed o Report any questions or concerns to PharmD and/or provider(s)  Please see past updates related to this goal by clicking on the "Past Updates" button in the selected goal        Diabetes   A1c goal < 7%  Recent Relevant Labs: Lab Results  Component Value Date/Time   HGBA1C 6.7 (A) 03/03/2020 01:44  PM   HGBA1C 6.6 (H) 09/29/2019 02:36 PM   HGBA1C 6.5 04/30/2019 11:34 AM   MICROALBUR 2.6 (H) 07/15/2018 10:24 AM   MICROALBUR 1.0 10/20/2015 11:47 AM     Patient has failed these meds in past: glimepiride, metformin Patient is currently controlled on the following medications:   no medications indicated  We discussed: A1c is well controlled without medications; congratulated patient on good control with diet and exercise.  Plan  Continue control with diet and exercise   Hypertension   BP goal < 140/90  Office blood pressures are  BP Readings from Last 3 Encounters:  06/27/20 (!) 156/82  06/26/20 (!) 184/74  05/17/20 (!) 114/58   Patient checks BP at home 1-2x per week  Patient home BP readings are ranging: SBP 120-130s  Patient has failed these meds in the past: n/a Patient is currently controlled on the following medications:   metoprolol succinate 50 mg daily  olmesartan 40 mg daily  isosorbide MN 30 mg daily  We discussed: BP goals; pt recently  stopped amlodipine and reduced dose of isosorbide due to symptomatic hypotension; pt reports BP has been higher and she feels well  Plan  Continue current medications and control with diet and exercise   Hyperlipidemia / CAD   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 Myoview 3/21: no ischemia present. LDL goal < 70  Lipid Panel     Component Value Date/Time   CHOL 94 04/30/2019 1134   TRIG 85.0 04/30/2019 1134   TRIG 164 (H) 06/24/2006 0738   HDL 50.20 04/30/2019 1134   CHOLHDL 2 04/30/2019 1134   VLDL 17.0 04/30/2019 1134   LDLCALC 26 04/30/2019 1134   LDLDIRECT 68.0 03/10/2015 1530     Hepatic Function Latest Ref Rng & Units 09/29/2019 07/15/2018 03/10/2015  Total Protein 6.0 - 8.3 g/dL 6.9 7.0 7.2  Albumin 3.5 - 5.2 g/dL 4.1 4.3 4.0  AST 0 - 37 U/L 20 20 20   ALT 0 - 35 U/L 18 22 27   Alk Phosphatase 39 - 117 U/L 53 50 70  Total Bilirubin 0.2 - 1.2 mg/dL 0.3 0.5 0.4  Bilirubin, Direct 0.0 - 0.3 mg/dL 0.1 - -   Patient has failed these meds in past: n/a Patient is currently controlled on the following medications:   atorvastatin 80 mg daily HS,   ezetimibe 10 mg daily,  aspirin 81 mg daily,   Isosorbide MN 60 mg daily  clopidogrel 75 mg daily,   nitroglycerin 0.4 mg SL prn,   fish oil OTC   We discussed:  Cholesterol goals; benefits of statin for ASCVD risk reduction; importance of DAPT for stent patency; pt has not had to use NTG recently  Plan  Continue current medications and control with diet and exercise    Medication Management   Pt uses [facility] for all medications 90-day pill packs Pt endorses 100% compliance  We discussed: Reviewed patient's [facility] medication and Epic medication profile assuring there are no discrepancies or gaps in therapy. Confirmed all fill dates appropriate and verified with patient that there is a sufficient quantity of all prescribed medications at home. Informed patient to call me any time if needing medications before scheduled  deliveries. The medication sync date was 03/03/20  Plan  Utilize [facility] pharmacy for medication synchronization, packaging and delivery     Follow up: 6 month phone visit  Charlene Brooke, PharmD, [facility] Clinical Pharmacist [facility] [phone]

## 2020-07-08 NOTE — Progress Notes (Signed)
Called patient to remind them of tele-visit call from the Clinical pharmacist today at 1:00pm

## 2020-07-08 NOTE — Patient Instructions (Signed)
Visit Information  Phone number for Pharmacist: [phone]  Goals Addressed            This Visit's Progress   . Pharmacy Care Plan       CARE PLAN ENTRY (see longitudinal plan of care for additional care plan information)  Current Barriers:  . Chronic Disease Management support, education, and care coordination needs related to Hypertension, Hyperlipidemia, Diabetes, and Coronary Artery Disease   Hypertension BP Readings from Last 3 Encounters:  06/27/20 (!) 156/82  06/26/20 (!) 184/74  05/17/20 (!) 114/58 .  Pharmacist Clinical Goal(s): o Over the next 180 days, patient will work with PharmD and providers to maintain BP goal <140/90 . Current regimen:  o metoprolol succinate 50 mg daily,  o olmesartan 40 mg daily, o isosorbide MN 30 mg daily . Interventions: o Discussed BP goals and benefits of medication for prevention of heart attack / stroke . Patient self care activities - Over the next 180 days, patient will: o Check BP daily, document, and provide at future appointments o Ensure daily salt intake < 2300 mg/day  Hyperlipidemia / Coronary artery disease Lab Results  Component Value Date/Time   LDLCALC 26 04/30/2019 11:34 AM   LDLDIRECT 68.0 03/10/2015 03:30 PM .  Pharmacist Clinical Goal(s): o Over the next 180 days, patient will work with PharmD and providers to maintain LDL goal < 70 . Current regimen:  o atorvastatin 80 mg daily o ezetimibe 10 mg daily,  o nitroglycerin 0.4 mg as needed o aspirin 81 mg daily,  o clopidogrel 75 mg daily,  o fish oil OTC  . Interventions: o Discussed cholesterol goals and benefits of medication for prevention of heart attack / stroke . Patient self care activities - Over the next 180 days, patient will: o Continue medication as prescribed o Use Nitroglycerin as needed for chest pain; if pain is not relieved by 3 doses 5 min apart, seek medical attention  Diabetes Lab Results  Component Value Date/Time   HGBA1C 6.7 (A)  03/03/2020 01:44 PM   HGBA1C 6.6 (H) 09/29/2019 02:36 PM   HGBA1C 6.5 04/30/2019 11:34 AM .  Pharmacist Clinical Goal(s): o Over the next 180 days, patient will work with PharmD and providers to maintain A1c goal <7% . Current regimen:  o No medications needed . Interventions: o Discussed A1c goals and benefits of diet/exercise for prevention of diabetic complications. Patient is doing well off of metformin. . Patient self care activities - Over the next 180 days, patient will: o Continue low-sugar diet and exercise routine  Medication management . Pharmacist Clinical Goal(s): o Over the next 180 days, patient will work with PharmD and providers to achieve optimal medication adherence . Current pharmacy: [facility] . Interventions o Comprehensive medication review performed. o Utilize [facility] pharmacy for medication synchronization, packaging and delivery . Patient self care activities - Over the next 180 days, patient will: o Focus on medication adherence by pill pack o Take medications as prescribed o Report any questions or concerns to PharmD and/or provider(s)  Please see past updates related to this goal by clicking on the "Past Updates" button in the selected goal       Patient verbalizes understanding of instructions provided today.  Telephone follow up appointment with pharmacy team member scheduled for: 6 months  Charlene Brooke, PharmD, [facility] Clinical Pharmacist [facility] at [facility] [phone]

## 2020-07-11 ENCOUNTER — Encounter: Payer: Self-pay | Admitting: Cardiovascular Disease

## 2020-07-11 ENCOUNTER — Ambulatory Visit: Payer: PPO | Admitting: Cardiovascular Disease

## 2020-07-11 ENCOUNTER — Other Ambulatory Visit: Payer: Self-pay

## 2020-07-11 VITALS — BP 140/72 | HR 72 | Ht 60.0 in | Wt 126.4 lb

## 2020-07-11 DIAGNOSIS — E782 Mixed hyperlipidemia: Secondary | ICD-10-CM | POA: Diagnosis not present

## 2020-07-11 DIAGNOSIS — I10 Essential (primary) hypertension: Secondary | ICD-10-CM

## 2020-07-11 DIAGNOSIS — I25119 Atherosclerotic heart disease of native coronary artery with unspecified angina pectoris: Secondary | ICD-10-CM | POA: Diagnosis not present

## 2020-07-11 NOTE — Progress Notes (Signed)
Cardiology Office Note:    Date:  07/11/2020   ID:  [name] [name], DOB 02-14-1941, MRN [id number]  PCP:  Janith Lima, MD  [facility] Cardiologist:  Sherren Mocha, MD  [place] Electrophysiologist:  None   Referring MD: Janith Lima, MD   Chief Complaint  Patient presents with  . Coronary Artery Disease    History of Present Illness:    [name] [name] is a 79 y.o. female with a hx of:  Coronary artery disease  ? S/p Inf MI in 2007 >> PCI: DES to RCA ? S/p POBA to [facility] and DES to dRCA in 07/2017 ([facility] in [place], [place])  Mid LAD mod dz >> neg by FFR ? Myoview 11/2019: no ischemia  ? Cath 7/21: pRCA 95 >> PCI: DES   Bacterial endocarditis  ? Admitted to [facility] in [place], [place]  Rx with IV antibiotics x 3 weeks ? Echocardiogram 10/2018: EF 55-60, AoV calcification (?Lambl's Excrescence) ? 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  Hypertension   Hyperlipidemia   Diabetes mellitus   The patient presents for cardiology follow-up evaluation today.  She was last seen by Richardson Dopp March 22, 2020.  She had developed symptoms of progressive angina and she was referred for cardiac catheterization this summer.  She was found to have severe stenosis in the mid right coronary artery proximal to the previously implanted stent.  She was treated with repeat stenting with a good result.  She has felt better since undergoing PCI and she notes improvement in her anginal symptoms.  She continues to have occasional chest tightness with certain activities, but much less than in the past.  She denies orthopnea, PND, heart palpitations, or shortness of breath.  She remains functionally independent, continues to drive an automobile, and is able to perform her activities of daily living.  Past Medical History:  Diagnosis Date  . Allergy   . Anxiety   . CAD (coronary artery disease)    s/p inf MI 2007 - tx w/ DES to RCA // Echo 12/08: EF 60%,  mild MR, mild LAE // LHC 08/2009: dLM 20%, LAD beyond diagonal 60-70%, stable from prior catheterization in 2007, ostial circumflex 40-50%, mid RCA stent ok, EF 65% // Myoview 08/2009: EF 76%, small inferoapical fixed defect, no ischemia // Myoview 7/13: no scar or ischemia, EF 69% //    . Cataract    removed both eyes  . Cerebrovascular disease, unspecified   . Chronic kidney disease    frequent Kidney Infections  . Chronic neck pain   . Chronic pain syndrome   . Diverticular disease   . Dizziness   . DJD (degenerative joint disease)   . DM type 2 (diabetes mellitus, type 2) ([place])   . Echocardiogram    Echo 10/2018: EF 55-60, normal RVSF, mod MAC, mod TR, severe AoV calcification and sclerosis with nodular calcium/mobile area of calcium in the LVOT (small veg vs Lambl's excrescence  - consider TEE), mild AI, mild AS (mean 11).   . Echocardiogram 04/2019   Echocardiogram 04/2019: EF 60-65, basal septal hypertrophy, grade 2 diastolic dysfunction, normal wall motion, normal RV SF, mild LAE, mod MAC, trivial MR, mod sclerosis of the aortic valve with mod aortic annular calcification, thin mobile filamentous structure on ventricular side of AV likely representing Lambl's excrescence, mild AI, mild TR  . GERD (gastroesophageal reflux disease)   . H/O hiatal hernia   . HTN (hypertension)   . IBS (  irritable bowel syndrome)   . 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 Internal hemorrhoids   . Ischemic colitis ([place])   . Mitral valve prolapse   . Mixed hyperlipidemia   . Myocardial infarction ([place]) 2007  . Neuromuscular disorder ([place])    hiatal hernia  . Nocturia   . Pancreatitis    1955 an once more  . PONV (postoperative nausea and vomiting)    Difficluty opening mouth wide and turning head. (Cervical Fusion)  . Premature ventricular contractions   . Tubular adenoma of colon   . Ulcer    [facility]  . Urinary incontinence     Past Surgical History:  Procedure  Laterality Date  . APPENDECTOMY  1955  . BLADDER REPAIR  2007  . CARDIAC CATHETERIZATION  2007   Stents  . CERVICAL SPINE SURGERY  07/2005   Dr Consuello Masse  . CHOLECYSTECTOMY  2007  . COLONOSCOPY    . CORONARY STENT INTERVENTION N/A 03/09/2020   Procedure: CORONARY STENT INTERVENTION;  Surgeon: Burnell Blanks, MD;  Location: [place] [facility];  Service: Cardiovascular;  Laterality: N/A;  . DENTAL SURGERY  05/2013   replaced an inplant  . EYE SURGERY  2011   Bilteral  . I & D KNEE WITH POLY EXCHANGE  09/25/2011   Procedure: IRRIGATION AND DEBRIDEMENT KNEE WITH POLY EXCHANGE;  Surgeon: Johnny Bridge, MD;  Location: [place];  Service: Orthopedics;  Laterality: Left;  . INCISION AND DRAINAGE ABSCESS / HEMATOMA OF BURSA / KNEE / THIGH  09/2011  . JOINT REPLACEMENT  2012   left  . LEFT HEART CATH AND CORONARY ANGIOGRAPHY N/A 03/09/2020   Procedure: LEFT HEART CATH AND CORONARY ANGIOGRAPHY;  Surgeon: Burnell Blanks, MD;  Location: [place] [facility];  Service: Cardiovascular;  Laterality: N/A;  . left Total Knee Replacement  11/2010   Dr Percell Miller  . NECK SURGERY     has had 3 surgeries  . POLYPECTOMY    . POSTERIOR CERVICAL FUSION/FORAMINOTOMY  04/08/2012   Procedure: POSTERIOR CERVICAL FUSION/FORAMINOTOMY LEVEL 2;  Surgeon: Otilio Connors, MD;  Location: [place] [facility];  Service: Neurosurgery;  Laterality: Left;  Left Cervical six-seven Foraminotomy, bilateral cervical seven-thoracic one foraminotomy, cervical six-seven, cervical seven-thoracic one fusion with posterior instrumentation  . TEMPOROMANDIBULAR JOINT SURGERY    . thumb surgery    . TONSILLECTOMY    . TONSILLECTOMY  1950  . TOTAL ABDOMINAL HYSTERECTOMY      Current Medications: Current Meds  Medication Sig  . Ascorbic Acid (VITAMIN C) 500 MG CAPS Take 500 mg by mouth in the morning and at bedtime.   Marland Kitchen aspirin EC 81 MG tablet Take 81 mg by mouth every morning.   Marland Kitchen atorvastatin (LIPITOR) 80 MG tablet TAKE ONE TABLET BY  MOUTH EVERYDAY AT BEDTIME  . Cholecalciferol (VITAMIN D3) 1000 UNITS CAPS Take 1,000 Units by mouth daily.   . clopidogrel (PLAVIX) 75 MG tablet TAKE ONE TABLET BY MOUTH EVERY MORNING  . DEXILANT 60 MG capsule TAKE ONE TABLET BY MOUTH EVERY MORNING  . dicyclomine (BENTYL) 10 MG capsule TAKE ONE TABLET BY MOUTH FOUR TIMES DAILY  . ezetimibe (ZETIA) 10 MG tablet TAKE ONE TABLET BY MOUTH EVERY MORNING  . gabapentin (NEURONTIN) 300 MG capsule TAKE TWO CAPSULES BY MOUTH AT BREAKFAST AND AT BEDTIME  . isosorbide mononitrate (IMDUR) 30 MG 24 hr tablet Take 1 tablet (30 mg total) by mouth daily.  Javier Docker Oil 1000 MG CAPS Take  1,000 mg by mouth daily.  . metoprolol succinate (TOPROL-XL) 50 MG 24 hr tablet Take 1 tablet (50 mg total) by mouth daily.  . nitroGLYCERIN (NITROSTAT) 0.4 MG SL tablet Place 1 tablet (0.4 mg total) under the tongue every 5 (five) minutes as needed for chest pain.  Marland Kitchen olmesartan (BENICAR) 40 MG tablet TAKE ONE TABLET BY MOUTH EVERY MORNING  . Omega-3 Fatty Acids (FISH OIL) 1000 MG CAPS Take 1 capsule by mouth 2 (two) times daily.   . promethazine (PHENERGAN) 12.5 MG tablet Take 1 tablet (12.5 mg total) by mouth every 6 (six) hours as needed for nausea or vomiting.  . venlafaxine XR (EFFEXOR-XR) 37.5 MG 24 hr capsule TAKE ONE CAPSULE BY MOUTH EVERY MORNING     Allergies:   Nitrofurantoin, Niacin and related, Ceftriaxone, Codeine, Prednisone, Sulfonamide derivatives, and Tetracycline   Social History   Socioeconomic History  . Marital status: Widowed    Spouse name: dolly Jenne  . Number of children: Not on file  . Years of education: Not on file  . Highest education level: Not on file  Occupational History    Employer: RETIRED  Tobacco Use  . Smoking status: Never Smoker  . Smokeless tobacco: Never Used  Vaping Use  . Vaping Use: Never used  Substance and Sexual Activity  . Alcohol use: No  . Drug use: No  . Sexual activity: Yes  Other Topics Concern  . Not on file   Social History Narrative   Pt never knew her father   Daily caffeine use   Social Determinants of Health   Financial Resource Strain:   . Difficulty of Paying Living Expenses: Not on file  Food Insecurity:   . Worried About Charity fundraiser in the Last Year: Not on file  . Ran Out of Food in the Last Year: Not on file  Transportation Needs:   . Lack of Transportation (Medical): Not on file  . Lack of Transportation (Non-Medical): Not on file  Physical Activity: Insufficiently Active  . Days of Exercise per Week: 4 days  . Minutes of Exercise per Session: 30 min  Stress:   . Feeling of Stress : Not on file  Social Connections:   . Frequency of Communication with Friends and Family: Not on file  . Frequency of Social Gatherings with Friends and Family: Not on file  . Attends Religious Services: Not on file  . Active Member of Clubs or Organizations: Not on file  . Attends Archivist Meetings: Not on file  . Marital Status: Not on file     Family History: The patient's family history includes Cancer - Other in her brother; Colon polyps in her sister; Coronary artery disease in some other family members; Heart disease in her mother; Pancreatic cancer in her mother. There is no history of Colon cancer, Esophageal cancer, Rectal cancer, or Stomach cancer.  ROS:   Please see the history of present illness.    All other systems reviewed and are negative.  EKGs/Labs/Other Studies Reviewed:    The following studies were reviewed today: Myoview 11/12/2019: Study Highlights    Nuclear stress EF: 73%.  There was no ST segment deviation noted during stress.  No T wave inversion was noted during stress.  The study is normal.  This is a low risk study.  The left ventricular ejection fraction is hyperdynamic (>65%).   Impression:  1. Normal myocardial perfusion imaging study without evidence of ischemia or infarction.  2. Normal  LVEF, >65%.  Cath  03/09/2020: Conclusion    Mid RCA lesion is 10% stenosed.  Prox RCA to Mid RCA lesion is 95% stenosed.  A drug-eluting stent was successfully placed using a Collbran [id number].  Post intervention, there is a 0% residual stenosis.  Prox Cx to Mid Cx lesion is 30% stenosed.  Prox LAD to Mid LAD lesion is 50% stenosed.  Mid LAD lesion is 30% stenosed.   1. Large, dominant RCA with patent mid stents. Severe stenosis just prior to the stented segment in the mid vessel.  2. Moderate non-obstructive proximal LAD stenosis 3. Successful PTCA/DES x 1 mid RCA  Recommendations: Continue DAPT with ASA and Plavix. Continue statin and beta blocker.   Cardiac Cath 03-09-2020: Diagnostic Dominance: Right Left Anterior Descending  Vessel is large.  Prox LAD to Mid LAD lesion is 50% stenosed.  Mid LAD lesion is 30% stenosed.  Left Circumflex  Prox Cx to Mid Cx lesion is 30% stenosed.  Right Coronary Artery  Prox RCA to Mid RCA lesion is 95% stenosed.  Mid RCA lesion is 10% stenosed. The lesion was previously treated using a stent (unknown type) over 2 years ago.  Intervention  Prox RCA to Mid RCA lesion  Stent  Pre-stent angioplasty was performed using a BALLOON SAPPHIRE 2.0X12. A drug-eluting stent was successfully placed using a [place] [id number]. Stent strut is well apposed. Post-stent angioplasty was performed using a BALLOON SAPPHIRE [place] 3.0X12.  Post-Intervention Lesion Assessment  The intervention was successful. Pre-interventional TIMI flow is 3. Post-intervention TIMI flow is 3. No complications occurred at this lesion.  There is a 0% residual stenosis post intervention.  Coronary Diagrams  Diagnostic Dominance: Right  Intervention    EKG:  EKG is not ordered today.    Recent Labs: 09/29/2019: ALT 18 05/17/2020: BUN 12; Creatinine, Ser 0.89; Hemoglobin 12.8; Platelets 262; Potassium 4.8; Sodium 138  Recent Lipid Panel    Component Value Date/Time    CHOL 94 04/30/2019 1134   TRIG 85.0 04/30/2019 1134   TRIG 164 (H) 06/24/2006 0738   HDL 50.20 04/30/2019 1134   CHOLHDL 2 04/30/2019 1134   VLDL 17.0 04/30/2019 1134   LDLCALC 26 04/30/2019 1134   LDLDIRECT 68.0 03/10/2015 1530     Risk Assessment/Calculations:       Physical Exam:    VS:  BP 140/72   Pulse 72   Ht 5' (1.524 m)   Wt 126 lb 6.4 oz (57.3 kg)   SpO2 93%   BMI 24.69 kg/m     Wt Readings from Last 3 Encounters:  07/11/20 126 lb 6.4 oz (57.3 kg)  06/27/20 125 lb (56.7 kg)  06/26/20 125 lb 10.6 oz (57 kg)     GEN:  Well nourished, well developed elderly woman in no acute distress HEENT: Normal NECK: No JVD; No carotid bruits LYMPHATICS: No lymphadenopathy CARDIAC: RRR, 2/6 SEM at the RUSB RESPIRATORY:  Clear to auscultation without rales, wheezing or rhonchi  ABDOMEN: Soft, non-tender, non-distended MUSCULOSKELETAL:  No edema; No deformity  SKIN: Warm and dry NEUROLOGIC:  Alert and oriented x 3 PSYCHIATRIC:  Normal affect   ASSESSMENT:    1. Coronary artery disease involving native coronary artery of native heart with angina pectoris ([place])   2. Mixed hyperlipidemia   3. Essential hypertension    PLAN:    In order of problems listed above:  1. Patient appears to be stable.  She remains on dual antiplatelet therapy with aspirin and  clopidogrel after recent PCI.  Anginal symptoms are only mild at present.  She continues on isosorbide and metoprolol succinate.  Treated with a high intensity statin drug. 2. Lipids in an excellent range with an LDL of 26 mg/dL on last check.  Continue atorvastatin 80 mg daily and ezetimibe 10 mg daily. 3. Blood pressure is well controlled on isosorbide, metoprolol, and Benicar.  Shared Decision Making/Informed Consent      Medication Adjustments/Labs and Tests Ordered: Current medicines are reviewed at length with the patient today.  Concerns regarding medicines are outlined above.  No orders of the defined types  were placed in this encounter.  No orders of the defined types were placed in this encounter.   Patient Instructions  Medication Instructions:  Your provider recommends that you continue on your current medications as directed. Please refer to the Current Medication list given to you today.   *If you need a refill on your cardiac medications before your next appointment, please call your pharmacy*   Follow-Up: At [facility], you and your health needs are our priority.  As part of our continuing mission to provide you with exceptional heart care, we have created designated Provider Care Teams.  These Care Teams include your primary Cardiologist (physician) and Advanced Practice Providers (APPs -  Physician Assistants and Nurse Practitioners) who all work together to provide you with the care you need, when you need it. Your next appointment:   6 month(s) The format for your next appointment:   In Person Provider:   You will see one of the following Advanced Practice Providers on your designated Care Team:    Richardson Dopp, PA-C  Robbie Lis, [place]    Signed, Sherren Mocha, MD  07/11/2020 1:35 PM    [place]

## 2020-07-11 NOTE — Patient Instructions (Signed)
Medication Instructions:  Your provider recommends that you continue on your current medications as directed. Please refer to the Current Medication list given to you today.   *If you need a refill on your cardiac medications before your next appointment, please call your pharmacy*  Follow-Up: At [facility], you and your health needs are our priority.  As part of our continuing mission to provide you with exceptional heart care, we have created designated Provider Care Teams.  These Care Teams include your primary Cardiologist (physician) and Advanced Practice Providers (APPs -  Physician Assistants and Nurse Practitioners) who all work together to provide you with the care you need, when you need it. Your next appointment:   6 month(s) The format for your next appointment:   In Person Provider:   You will see one of the following Advanced Practice Providers on your designated Care Team:    Scott Weaver, PA-C  Vin Bhagat, PA-C 

## 2020-07-22 ENCOUNTER — Telehealth: Payer: Self-pay | Admitting: Pharmacist

## 2020-07-22 NOTE — Progress Notes (Signed)
Chronic Care Management Pharmacy Assistant   Name: JESLIE LOWE  MRN: [id number] DOB: 09/20/1940  Reason for Encounter: Medication Review  Patient Questions:  1.  Have you seen any other providers since your last visit? No  2.  Any changes in your medicines or health? No   PCP : Janith Lima, MD  Allergies:   Allergies  Allergen Reactions  . Nitrofurantoin Nausea Only    Severe nausea  . Niacin And Related Other (See Comments)    Must take "Flush-free"  . Ceftriaxone Itching  . Codeine Nausea Only  . Prednisone Itching and Rash  . Sulfonamide Derivatives Itching  . Tetracycline Itching and Rash    Medications: Outpatient Encounter Medications as of 07/22/2020  Medication Sig  . Ascorbic Acid (VITAMIN C) 500 MG CAPS Take 500 mg by mouth in the morning and at bedtime.   Marland Kitchen aspirin EC 81 MG tablet Take 81 mg by mouth every morning.   Marland Kitchen atorvastatin (LIPITOR) 80 MG tablet TAKE ONE TABLET BY MOUTH EVERYDAY AT BEDTIME  . Cholecalciferol (VITAMIN D3) 1000 UNITS CAPS Take 1,000 Units by mouth daily.   . clopidogrel (PLAVIX) 75 MG tablet TAKE ONE TABLET BY MOUTH EVERY MORNING  . DEXILANT 60 MG capsule TAKE ONE TABLET BY MOUTH EVERY MORNING  . dicyclomine (BENTYL) 10 MG capsule TAKE ONE TABLET BY MOUTH FOUR TIMES DAILY  . ezetimibe (ZETIA) 10 MG tablet TAKE ONE TABLET BY MOUTH EVERY MORNING  . gabapentin (NEURONTIN) 300 MG capsule TAKE TWO CAPSULES BY MOUTH AT BREAKFAST AND AT BEDTIME  . isosorbide mononitrate (IMDUR) 30 MG 24 hr tablet Take 1 tablet (30 mg total) by mouth daily.  Javier Docker Oil 1000 MG CAPS Take 1,000 mg by mouth daily.  . metoprolol succinate (TOPROL-XL) 50 MG 24 hr tablet Take 1 tablet (50 mg total) by mouth daily.  . nitroGLYCERIN (NITROSTAT) 0.4 MG SL tablet Place 1 tablet (0.4 mg total) under the tongue every 5 (five) minutes as needed for chest pain.  Marland Kitchen olmesartan (BENICAR) 40 MG tablet TAKE ONE TABLET BY MOUTH EVERY MORNING  . Omega-3 Fatty Acids (FISH  OIL) 1000 MG CAPS Take 1 capsule by mouth 2 (two) times daily.   . promethazine (PHENERGAN) 12.5 MG tablet Take 1 tablet (12.5 mg total) by mouth every 6 (six) hours as needed for nausea or vomiting.  . venlafaxine XR (EFFEXOR-XR) 37.5 MG 24 hr capsule TAKE ONE CAPSULE BY MOUTH EVERY MORNING   No facility-administered encounter medications on file as of 07/22/2020.    Current Diagnosis: Patient Active Problem List   Diagnosis Date Noted  . Contusion of left forearm 06/27/2020  . Unstable angina ([place])   . Abscess of right lower leg 09/23/2019  . History of bacterial endocarditis 10/28/2018  . Bilateral posterior neck pain 07/20/2015  . Episodic tension-type headache, not intractable 11/08/2014  . Other seasonal allergic rhinitis 07/08/2014  . Diabetic peripheral neuropathy ([place]) 06/29/2014  . Routine general medical examination at a health care facility 01/20/2014  . Other screening mammogram 01/20/2014  . Essential hypertension 08/03/2009  . CAD (coronary artery disease) 01/14/2008  . Controlled diabetes mellitus type 2 with complications ([place]) 53/97/6734  . Hyperlipidemia with target LDL less than 100 06/13/2007  . Depression with anxiety 06/13/2007  . GASTROESOPHAGEAL REFLUX DISEASE 06/13/2007  . DEGENERATIVE JOINT DISEASE 06/13/2007    Goals Addressed   None     Follow-Up:  Coordination of Enhanced Pharmacy Services   Reviewed chart for medication  changes ahead of medication coordination call.  No OVs, Consults, or hospital visits since last care coordination call/Pharmacist visit. No medication changes  Indicated.  BP Readings from Last 3 Encounters:  07/11/20 140/72  06/27/20 (!) 156/82  06/26/20 (!) 184/74    Lab Results  Component Value Date   HGBA1C 6.7 (A) 03/03/2020     Patient obtains medications through Adherence Packaging  90 Days   Last adherence delivery included:   Dexilant 60mg   Ezetimibe 10mg   Clopidogrel 75mg   Atorvastatin 80mg   Olmesartan  Medoxomil 40mg   Gabapentin 300mg   Dicyclomine 10mg   Venlafaxine 97.5 Er  Isosorbide Mononitrate ER 30mg   Metoprolol Succinate Er 50mg    Patient is due for next adherence delivery on: 06/02/2020. Called patient and reviewed medications and coordinated delivery.  This delivery to include:  Dexilant 60mg   Ezetimibe 10mg   Clopidogrel 75mg   Atorvastatin 80mg   Olmesartan Medoxomil 40mg   Gabapentin 300mg   Dicyclomine 10mg   Venlafaxine 97.5 Er  Isosorbide Mononitrate ER 30mg   Metoprolol Succinate Er 50mg    Patient declined the following medications   Dexilant 60mg   Ezetimibe 10mg   Clopidogrel 75mg   Atorvastatin 80mg   Olmesartan Medoxomil 40mg   Gabapentin 300mg   Dicyclomine 10mg   Venlafaxine 97.5 Er  Isosorbide Mononitrate ER 30mg   Metoprolol Succinate Er 50mg    The patient received a 90-day supply on 06/02/2020 supply ends on 09/01/2020.   Rosendo Gros, [facility]  Practice Team Manager/ CPA (Clinical Pharmacist Assistant) [phone]

## 2020-08-22 ENCOUNTER — Telehealth: Payer: Self-pay | Admitting: Pharmacist

## 2020-08-22 NOTE — Progress Notes (Signed)
Chronic Care Management Pharmacy Assistant   Name: [name] [name]  MRN: [id number] DOB: [birth year]  Reason for Encounter: Medication Review  Patient Questions:  1.  Have you seen any other providers since your last visit? No  2.  Any changes in your medicines or health? No    PCP : Janith Lima, MD  Allergies:   Allergies  Allergen Reactions  . Nitrofurantoin Nausea Only    Severe nausea  . Niacin And Related Other (See Comments)    Must take "Flush-free"  . Ceftriaxone Itching  . Codeine Nausea Only  . Prednisone Itching and Rash  . Sulfonamide Derivatives Itching  . Tetracycline Itching and Rash    Medications: Outpatient Encounter Medications as of 08/22/2020  Medication Sig  . Ascorbic Acid (VITAMIN C) 500 MG CAPS Take 500 mg by mouth in the morning and at bedtime.   Marland Kitchen aspirin EC 81 MG tablet Take 81 mg by mouth every morning.   Marland Kitchen atorvastatin (LIPITOR) 80 MG tablet TAKE ONE TABLET BY MOUTH EVERYDAY AT BEDTIME  . Cholecalciferol (VITAMIN D3) 1000 UNITS CAPS Take 1,000 Units by mouth daily.   . clopidogrel (PLAVIX) 75 MG tablet TAKE ONE TABLET BY MOUTH EVERY MORNING  . DEXILANT 60 MG capsule TAKE ONE TABLET BY MOUTH EVERY MORNING  . dicyclomine (BENTYL) 10 MG capsule TAKE ONE TABLET BY MOUTH FOUR TIMES DAILY  . ezetimibe (ZETIA) 10 MG tablet TAKE ONE TABLET BY MOUTH EVERY MORNING  . gabapentin (NEURONTIN) 300 MG capsule TAKE TWO CAPSULES BY MOUTH AT BREAKFAST AND AT BEDTIME  . isosorbide mononitrate (IMDUR) 30 MG 24 hr tablet Take 1 tablet (30 mg total) by mouth daily.  Javier Docker Oil 1000 MG CAPS Take 1,000 mg by mouth daily.  . metoprolol succinate (TOPROL-XL) 50 MG 24 hr tablet Take 1 tablet (50 mg total) by mouth daily.  . nitroGLYCERIN (NITROSTAT) 0.4 MG SL tablet Place 1 tablet (0.4 mg total) under the tongue every 5 (five) minutes as needed for chest pain.  Marland Kitchen olmesartan (BENICAR) 40 MG tablet TAKE ONE TABLET BY MOUTH EVERY MORNING  . Omega-3 Fatty Acids  (FISH OIL) 1000 MG CAPS Take 1 capsule by mouth 2 (two) times daily.   . promethazine (PHENERGAN) 12.5 MG tablet Take 1 tablet (12.5 mg total) by mouth every 6 (six) hours as needed for nausea or vomiting.  . venlafaxine XR (EFFEXOR-XR) 37.5 MG 24 hr capsule TAKE ONE CAPSULE BY MOUTH EVERY MORNING   No facility-administered encounter medications on file as of 08/22/2020.    Current Diagnosis: Patient Active Problem List   Diagnosis Date Noted  . Contusion of left forearm 06/27/2020  . Unstable angina ([place])   . Abscess of right lower leg 09/23/2019  . History of bacterial endocarditis 10/28/2018  . Bilateral posterior neck pain 07/20/2015  . Episodic tension-type headache, not intractable 11/08/2014  . Other seasonal allergic rhinitis 07/08/2014  . Diabetic peripheral neuropathy ([place]) 06/29/2014  . Routine general medical examination at a health care facility 01/20/2014  . Other screening mammogram 01/20/2014  . Essential hypertension 08/03/2009  . CAD (coronary artery disease) 01/14/2008  . Controlled diabetes mellitus type 2 with complications ([place]) 44/09/270  . Hyperlipidemia with target LDL less than 100 06/13/2007  . Depression with anxiety 06/13/2007  . GASTROESOPHAGEAL REFLUX DISEASE 06/13/2007  . DEGENERATIVE JOINT DISEASE 06/13/2007    Goals Addressed   None     Follow-Up:  Pharmacist Review  Reviewed chart for medication changes ahead of  medication coordination call.  No OVs, Consults, or hospital visits since last care coordination call/Pharmacist visit.  No medication changes indicated  BP Readings from Last 3 Encounters:  07/11/20 140/72  06/27/20 (!) 156/82  06/26/20 (!) 184/74    Lab Results  Component Value Date   HGBA1C 6.7 (A) 03/03/2020     Patient obtains medications through Adherence Packaging  90 Days   Last adherence delivery included: Dexilant 60mg   Ezetimibe 10mg   Clopidogrel 75mg   Atorvastatin 80mg   Olmesartan Medoxomil 40mg    Gabapentin 300mg   Dicyclomine 10mg   Venlafaxine 97.5 Er  Isosorbide Mononitrate ER 30mg   Metoprolol Succinate Er 50mg    Patient declined  Dexilant 60mg   Ezetimibe 10mg   Clopidogrel 75mg   Atorvastatin 80mg   Olmesartan Medoxomil 40mg   Gabapentin 300mg   Dicyclomine 10mg   Venlafaxine 97.5 Er  Isosorbide Mononitrate ER 30mg   Metoprolol Succinate Er 50mg      last month due to additional 90 supply on hand. Explanation of abundance on hand (ie #30 due to overlapping fills or previous adherence issues etc)  Patient is due for next adherence delivery on: 09/01/20. Called patient and reviewed medications and coordinated delivery.  This delivery to include: Dexilant 60mg   Ezetimibe 10mg   Clopidogrel 75mg   Atorvastatin 80mg   Olmesartan Medoxomil 40mg   Gabapentin 300mg   Dicyclomine 10mg   Venlafaxine 97.5 Er  Isosorbide Mononitrate ER 30mg   Metoprolol Succinate Er 50mg    Patient needs refills for Dexilant 60 mg, Ezetimibe 10 mg, Clopidogrel 75 mg, Atorvastatin 80 mg, Olmesartan medoxomil 40 mg, Gabapentin 300 mg, Dicyclomine 10 mg, and Venlafaxine 37.5 mg prescribe by Dr. [name].    Confirmed delivery date of 09/01/2020, advised patient that pharmacy will contact them the morning of delivery.  Wendy Poet, Clinical Pharmacist Assistant [facility]

## 2020-08-29 ENCOUNTER — Other Ambulatory Visit: Payer: Self-pay | Admitting: Internal Medicine

## 2020-08-29 DIAGNOSIS — E118 Type 2 diabetes mellitus with unspecified complications: Secondary | ICD-10-CM

## 2020-08-29 DIAGNOSIS — I2510 Atherosclerotic heart disease of native coronary artery without angina pectoris: Secondary | ICD-10-CM

## 2020-08-29 DIAGNOSIS — I10 Essential (primary) hypertension: Secondary | ICD-10-CM

## 2020-08-29 DIAGNOSIS — M542 Cervicalgia: Secondary | ICD-10-CM

## 2020-08-29 DIAGNOSIS — K2100 Gastro-esophageal reflux disease with esophagitis, without bleeding: Secondary | ICD-10-CM

## 2020-09-01 ENCOUNTER — Telehealth: Payer: Self-pay | Admitting: Pharmacist

## 2020-09-01 NOTE — Progress Notes (Signed)
Chronic Care Management Pharmacy Assistant   Name: [name] [name]  MRN: [id number] DOB: [birth year]  Reason for Encounter: Chart Review   PCP : Etta Grandchild, MD  Allergies:   Allergies  Allergen Reactions  . Nitrofurantoin Nausea Only    Severe nausea  . Niacin And Related Other (See Comments)    Must take "Flush-free"  . Ceftriaxone Itching  . Codeine Nausea Only  . Prednisone Itching and Rash  . Sulfonamide Derivatives Itching  . Tetracycline Itching and Rash    Medications: Outpatient Encounter Medications as of 09/01/2020  Medication Sig  . Ascorbic Acid (VITAMIN C) 500 MG CAPS Take 500 mg by mouth in the morning and at bedtime.   Marland Kitchen aspirin EC 81 MG tablet Take 81 mg by mouth every morning.   Marland Kitchen atorvastatin (LIPITOR) 80 MG tablet TAKE ONE TABLET BY MOUTH EVERYDAY AT BEDTIME  . Cholecalciferol (VITAMIN D3) 1000 UNITS CAPS Take 1,000 Units by mouth daily.   . clopidogrel (PLAVIX) 75 MG tablet TAKE ONE TABLET BY MOUTH EVERY MORNING  . DEXILANT 60 MG capsule TAKE ONE TABLET BY MOUTH EVERY MORNING  . dicyclomine (BENTYL) 10 MG capsule TAKE ONE TABLET BY MOUTH FOUR TIMES DAILY  . ezetimibe (ZETIA) 10 MG tablet TAKE ONE TABLET BY MOUTH EVERY MORNING  . gabapentin (NEURONTIN) 300 MG capsule TAKE TWO CAPSULES BY MOUTH BY MOUTH EVERY MORNING and TAKE TWO CAPSULES BY MOUTH EVERYDAY AT BEDTIME  . isosorbide mononitrate (IMDUR) 30 MG 24 hr tablet Take 1 tablet (30 mg total) by mouth daily.  Boris Lown Oil 1000 MG CAPS Take 1,000 mg by mouth daily.  . metoprolol succinate (TOPROL-XL) 50 MG 24 hr tablet Take 1 tablet (50 mg total) by mouth daily.  . nitroGLYCERIN (NITROSTAT) 0.4 MG SL tablet Place 1 tablet (0.4 mg total) under the tongue every 5 (five) minutes as needed for chest pain.  Marland Kitchen olmesartan (BENICAR) 40 MG tablet TAKE ONE TABLET BY MOUTH EVERY MORNING  . Omega-3 Fatty Acids (FISH OIL) 1000 MG CAPS Take 1 capsule by mouth 2 (two) times daily.   . promethazine (PHENERGAN)  12.5 MG tablet Take 1 tablet (12.5 mg total) by mouth every 6 (six) hours as needed for nausea or vomiting.  . venlafaxine XR (EFFEXOR-XR) 37.5 MG 24 hr capsule TAKE ONE CAPSULE BY MOUTH EVERY MORNING   No facility-administered encounter medications on file as of 09/01/2020.    Current Diagnosis: Patient Active Problem List   Diagnosis Date Noted  . Contusion of left forearm 06/27/2020  . Unstable angina ([facility])   . Abscess of right lower leg 09/23/2019  . History of bacterial endocarditis 10/28/2018  . Bilateral posterior neck pain 07/20/2015  . Episodic tension-type headache, not intractable 11/08/2014  . Other seasonal allergic rhinitis 07/08/2014  . Diabetic peripheral neuropathy ([facility]) 06/29/2014  . Routine general medical examination at a health care facility 01/20/2014  . Other screening mammogram 01/20/2014  . Essential hypertension 08/03/2009  . CAD (coronary artery disease) 01/14/2008  . Controlled diabetes mellitus type 2 with complications ([facility]) 08/15/2007  . Hyperlipidemia with target LDL less than 100 06/13/2007  . Depression with anxiety 06/13/2007  . GASTROESOPHAGEAL REFLUX DISEASE 06/13/2007  . DEGENERATIVE JOINT DISEASE 06/13/2007    Goals Addressed   None     Follow-Up:  Pharmacist Review   Reviewed chart for medication changes and adherence.  No OVs, consults, or hospital visits since last care coordination call/pharmacist visit No medication changes indicated.  No gaps  in adherence identified. Patient has follow up scheduled with pharmacy team. No further action required.   Wendy Poet, Clinical Pharmacist Assistant [facility]

## 2020-09-05 ENCOUNTER — Encounter: Payer: PPO | Admitting: Internal Medicine

## 2020-09-12 ENCOUNTER — Encounter: Payer: Self-pay | Admitting: Internal Medicine

## 2020-09-12 ENCOUNTER — Ambulatory Visit (INDEPENDENT_AMBULATORY_CARE_PROVIDER_SITE_OTHER): Payer: PPO | Admitting: Internal Medicine

## 2020-09-12 ENCOUNTER — Ambulatory Visit (INDEPENDENT_AMBULATORY_CARE_PROVIDER_SITE_OTHER): Payer: PPO

## 2020-09-12 ENCOUNTER — Other Ambulatory Visit: Payer: Self-pay

## 2020-09-12 VITALS — BP 126/80 | HR 80 | Temp 98.1°F | Resp 16 | Ht 60.0 in | Wt 127.0 lb

## 2020-09-12 VITALS — BP 128/60 | HR 78 | Temp 98.3°F | Ht 60.0 in | Wt 127.4 lb

## 2020-09-12 DIAGNOSIS — M8949 Other hypertrophic osteoarthropathy, multiple sites: Secondary | ICD-10-CM

## 2020-09-12 DIAGNOSIS — E875 Hyperkalemia: Secondary | ICD-10-CM | POA: Diagnosis not present

## 2020-09-12 DIAGNOSIS — N1832 Chronic kidney disease, stage 3b: Secondary | ICD-10-CM

## 2020-09-12 DIAGNOSIS — E118 Type 2 diabetes mellitus with unspecified complications: Secondary | ICD-10-CM

## 2020-09-12 DIAGNOSIS — M79651 Pain in right thigh: Secondary | ICD-10-CM | POA: Diagnosis not present

## 2020-09-12 DIAGNOSIS — S79921A Unspecified injury of right thigh, initial encounter: Secondary | ICD-10-CM

## 2020-09-12 DIAGNOSIS — Z0000 Encounter for general adult medical examination without abnormal findings: Secondary | ICD-10-CM | POA: Diagnosis not present

## 2020-09-12 DIAGNOSIS — E785 Hyperlipidemia, unspecified: Secondary | ICD-10-CM

## 2020-09-12 DIAGNOSIS — I10 Essential (primary) hypertension: Secondary | ICD-10-CM

## 2020-09-12 DIAGNOSIS — M159 Polyosteoarthritis, unspecified: Secondary | ICD-10-CM

## 2020-09-12 LAB — CBC WITH DIFFERENTIAL/PLATELET
Basophils Absolute: 0 10*3/uL (ref 0.0–0.1)
Basophils Relative: 0.7 % (ref 0.0–3.0)
Eosinophils Absolute: 0.1 10*3/uL (ref 0.0–0.7)
Eosinophils Relative: 1.9 % (ref 0.0–5.0)
HCT: 40.3 % (ref 36.0–46.0)
Hemoglobin: 13.3 g/dL (ref 12.0–15.0)
Lymphocytes Relative: 31.5 % (ref 12.0–46.0)
Lymphs Abs: 2.3 10*3/uL (ref 0.7–4.0)
MCHC: 33 g/dL (ref 30.0–36.0)
MCV: 95.2 fl (ref 78.0–100.0)
Monocytes Absolute: 0.6 10*3/uL (ref 0.1–1.0)
Monocytes Relative: 8.1 % (ref 3.0–12.0)
Neutro Abs: 4.3 10*3/uL (ref 1.4–7.7)
Neutrophils Relative %: 57.8 % (ref 43.0–77.0)
Platelets: 244 10*3/uL (ref 150.0–400.0)
RBC: 4.23 Mil/uL (ref 3.87–5.11)
RDW: 13.4 % (ref 11.5–15.5)
WBC: 7.4 10*3/uL (ref 4.0–10.5)

## 2020-09-12 LAB — URINALYSIS, ROUTINE W REFLEX MICROSCOPIC
Bilirubin Urine: NEGATIVE
Hgb urine dipstick: NEGATIVE
Ketones, ur: NEGATIVE
Nitrite: POSITIVE — AB
RBC / HPF: NONE SEEN (ref 0–?)
Specific Gravity, Urine: 1.015 (ref 1.000–1.030)
Total Protein, Urine: NEGATIVE
Urine Glucose: NEGATIVE
Urobilinogen, UA: 0.2 (ref 0.0–1.0)
pH: 6 (ref 5.0–8.0)

## 2020-09-12 LAB — HEPATIC FUNCTION PANEL
ALT: 19 U/L (ref 0–35)
AST: 20 U/L (ref 0–37)
Albumin: 4 g/dL (ref 3.5–5.2)
Alkaline Phosphatase: 60 U/L (ref 39–117)
Bilirubin, Direct: 0.1 mg/dL (ref 0.0–0.3)
Total Bilirubin: 0.3 mg/dL (ref 0.2–1.2)
Total Protein: 6.5 g/dL (ref 6.0–8.3)

## 2020-09-12 LAB — BASIC METABOLIC PANEL
BUN: 15 mg/dL (ref 6–23)
CO2: 35 mEq/L — ABNORMAL HIGH (ref 19–32)
Calcium: 9 mg/dL (ref 8.4–10.5)
Chloride: 100 mEq/L (ref 96–112)
Creatinine, Ser: 0.9 mg/dL (ref 0.40–1.20)
GFR: 60.22 mL/min (ref >60.00)
Glucose, Bld: 193 mg/dL — ABNORMAL HIGH (ref 70–99)
Potassium: 5.5 mEq/L — ABNORMAL HIGH (ref 3.5–5.1)
Sodium: 136 mEq/L (ref 135–145)

## 2020-09-12 LAB — LDL CHOLESTEROL, DIRECT: Direct LDL: 42 mg/dL

## 2020-09-12 LAB — TSH: TSH: 1.54 u[IU]/mL (ref 0.35–4.50)

## 2020-09-12 LAB — HEMOGLOBIN A1C: Hgb A1c MFr Bld: 7.2 % — ABNORMAL HIGH (ref 4.6–6.5)

## 2020-09-12 MED ORDER — CELECOXIB 50 MG PO CAPS: 50.0000 mg | ORAL_CAPSULE | Freq: Two times a day (BID) | ORAL | 0 refills | Status: DC

## 2020-09-12 NOTE — Patient Instructions (Signed)
Health Maintenance, Female Adopting a healthy lifestyle and getting preventive care are important in promoting health and wellness. Ask your health care provider about:  The right schedule for you to have regular tests and exams.  Things you can do on your own to prevent diseases and keep yourself healthy. What should I know about diet, weight, and exercise? Eat a healthy diet  Eat a diet that includes plenty of vegetables, fruits, low-fat dairy products, and lean protein.  Do not eat a lot of foods that are high in solid fats, added sugars, or sodium.   Maintain a healthy weight Body mass index (BMI) is used to identify weight problems. It estimates body fat based on height and weight. Your health care provider can help determine your BMI and help you achieve or maintain a healthy weight. Get regular exercise Get regular exercise. This is one of the most important things you can do for your health. Most adults should:  Exercise for at least 150 minutes each week. The exercise should increase your heart rate and make you sweat (moderate-intensity exercise).  Do strengthening exercises at least twice a week. This is in addition to the moderate-intensity exercise.  Spend less time sitting. Even light physical activity can be beneficial. Watch cholesterol and blood lipids Have your blood tested for lipids and cholesterol at 81 years of age, then have this test every 5 years. Have your cholesterol levels checked more often if:  Your lipid or cholesterol levels are high.  You are older than 81 years of age.  You are at high risk for heart disease. What should I know about cancer screening? Depending on your health history and family history, you may need to have cancer screening at various ages. This may include screening for:  Breast cancer.  Cervical cancer.  Colorectal cancer.  Skin cancer.  Lung cancer. What should I know about heart disease, diabetes, and high blood  pressure? Blood pressure and heart disease  High blood pressure causes heart disease and increases the risk of stroke. This is more likely to develop in people who have high blood pressure readings, are of African descent, or are overweight.  Have your blood pressure checked: ? Every 3-5 years if you are 18-39 years of age. ? Every year if you are 40 years old or older. Diabetes Have regular diabetes screenings. This checks your fasting blood sugar level. Have the screening done:  Once every three years after age 40 if you are at a normal weight and have a low risk for diabetes.  More often and at a younger age if you are overweight or have a high risk for diabetes. What should I know about preventing infection? Hepatitis B If you have a higher risk for hepatitis B, you should be screened for this virus. Talk with your health care provider to find out if you are at risk for hepatitis B infection. Hepatitis C Testing is recommended for:  Everyone born from 1945 through 1965.  Anyone with known risk factors for hepatitis C. Sexually transmitted infections (STIs)  Get screened for STIs, including gonorrhea and chlamydia, if: ? You are sexually active and are younger than 81 years of age. ? You are older than 81 years of age and your health care provider tells you that you are at risk for this type of infection. ? Your sexual activity has changed since you were last screened, and you are at increased risk for chlamydia or gonorrhea. Ask your health care provider   if you are at risk.  Ask your health care provider about whether you are at high risk for HIV. Your health care provider may recommend a prescription medicine to help prevent HIV infection. If you choose to take medicine to prevent HIV, you should first get tested for HIV. You should then be tested every 3 months for as long as you are taking the medicine. Pregnancy  If you are about to stop having your period (premenopausal) and  you may become pregnant, seek counseling before you get pregnant.  Take 400 to 800 micrograms (mcg) of folic acid every day if you become pregnant.  Ask for birth control (contraception) if you want to prevent pregnancy. Osteoporosis and menopause Osteoporosis is a disease in which the bones lose minerals and strength with aging. This can result in bone fractures. If you are 65 years old or older, or if you are at risk for osteoporosis and fractures, ask your health care provider if you should:  Be screened for bone loss.  Take a calcium or vitamin D supplement to lower your risk of fractures.  Be given hormone replacement therapy (HRT) to treat symptoms of menopause. Follow these instructions at home: Lifestyle  Do not use any products that contain nicotine or tobacco, such as cigarettes, e-cigarettes, and chewing tobacco. If you need help quitting, ask your health care provider.  Do not use street drugs.  Do not share needles.  Ask your health care provider for help if you need support or information about quitting drugs. Alcohol use  Do not drink alcohol if: ? Your health care provider tells you not to drink. ? You are pregnant, may be pregnant, or are planning to become pregnant.  If you drink alcohol: ? Limit how much you use to 0-1 drink a day. ? Limit intake if you are breastfeeding.  Be aware of how much alcohol is in your drink. In the [place] one drink equals one 12 oz bottle of beer (355 mL), one 5 oz glass of wine (148 mL), or one 1 oz glass of hard liquor (44 mL). General instructions  Schedule regular health, dental, and eye exams.  Stay current with your vaccines.  Tell your health care provider if: ? You often feel depressed. ? You have ever been abused or do not feel safe at home. Summary  Adopting a healthy lifestyle and getting preventive care are important in promoting health and wellness.  Follow your health care provider's instructions about healthy  diet, exercising, and getting tested or screened for diseases.  Follow your health care provider's instructions on monitoring your cholesterol and blood pressure. This information is not intended to replace advice given to you by your health care provider. Make sure you discuss any questions you have with your health care provider. Document Revised: 08/13/2018 Document Reviewed: 08/13/2018 Elsevier Patient Education  2021 [employer]  

## 2020-09-12 NOTE — Patient Instructions (Signed)
[name] [name] , Thank you for taking time to come for your Medicare Wellness Visit. I appreciate your ongoing commitment to your health goals. Please review the following plan we discussed and let me know if I can assist you in the future.   Screening recommendations/referrals: Colonoscopy: 06/28/2016; due every 5 years (family history of colon cancer) Mammogram: last done 01/14/2014; no repeat due to age Bone Density: last done 07/29/2012 Recommended yearly ophthalmology/optometry visit for glaucoma screening and checkup Recommended yearly dental visit for hygiene and checkup  Vaccinations: Influenza vaccine: 06/27/2020 Pneumococcal vaccine: up to date Tdap vaccine: 01/20/2014; due every 10 years Shingles vaccine: never done   Covid-19: never done  Advanced directives: Please bring a copy of your health care power of attorney and living will to the office at your convenience.  Conditions/risks identified: Yes; Reviewed health maintenance screenings with patient today and relevant education, vaccines, and/or referrals were provided. Please continue to do your personal lifestyle choices by: daily care of teeth and gums, regular physical activity (goal should be 5 days a week for 30 minutes), eat a healthy diet, avoid tobacco and drug use, limiting any alcohol intake, taking a low-dose aspirin (if not allergic or have been advised by your provider otherwise) and taking vitamins and minerals as recommended by your provider. Continue doing brain stimulating activities (puzzles, reading, adult coloring books, staying active) to keep memory sharp. Continue to eat heart healthy diet (full of fruits, vegetables, whole grains, lean protein, water--limit salt, fat, and sugar intake) and increase physical activity as tolerated.  Next appointment: Please schedule your next Medicare Wellness Visit with your Nurse Health Advisor in 1 year by calling [phone].   Preventive Care 37 Years and Older,  Female Preventive care refers to lifestyle choices and visits with your health care provider that can promote health and wellness. What does preventive care include?  A yearly physical exam. This is also called an annual well check.  Dental exams once or twice a year.  Routine eye exams. Ask your health care provider how often you should have your eyes checked.  Personal lifestyle choices, including:  Daily care of your teeth and gums.  Regular physical activity.  Eating a healthy diet.  Avoiding tobacco and drug use.  Limiting alcohol use.  Practicing safe sex.  Taking low-dose aspirin every day.  Taking vitamin and mineral supplements as recommended by your health care provider. What happens during an annual well check? The services and screenings done by your health care provider during your annual well check will depend on your age, overall health, lifestyle risk factors, and family history of disease. Counseling  Your health care provider may ask you questions about your:  Alcohol use.  Tobacco use.  Drug use.  Emotional well-being.  Home and relationship well-being.  Sexual activity.  Eating habits.  History of falls.  Memory and ability to understand (cognition).  Work and work Statistician.  Reproductive health. Screening  You may have the following tests or measurements:  Height, weight, and BMI.  Blood pressure.  Lipid and cholesterol levels. These may be checked every 5 years, or more frequently if you are over 14 years old.  Skin check.  Lung cancer screening. You may have this screening every year starting at age 13 if you have a 30-pack-year history of smoking and currently smoke or have quit within the past 15 years.  Fecal occult blood test (FOBT) of the stool. You may have this test every year starting at age  50.  Flexible sigmoidoscopy or colonoscopy. You may have a sigmoidoscopy every 5 years or a colonoscopy every 10 years  starting at age 34.  Hepatitis C blood test.  Hepatitis B blood test.  Sexually transmitted disease (STD) testing.  Diabetes screening. This is done by checking your blood sugar (glucose) after you have not eaten for a while (fasting). You may have this done every 1-3 years.  Bone density scan. This is done to screen for osteoporosis. You may have this done starting at age 93.  Mammogram. This may be done every 1-2 years. Talk to your health care provider about how often you should have regular mammograms. Talk with your health care provider about your test results, treatment options, and if necessary, the need for more tests. Vaccines  Your health care provider may recommend certain vaccines, such as:  Influenza vaccine. This is recommended every year.  Tetanus, diphtheria, and acellular pertussis (Tdap, Td) vaccine. You may need a Td booster every 10 years.  Zoster vaccine. You may need this after age 69.  Pneumococcal 13-valent conjugate (PCV13) vaccine. One dose is recommended after age 37.  Pneumococcal polysaccharide (PPSV23) vaccine. One dose is recommended after age 22. Talk to your health care provider about which screenings and vaccines you need and how often you need them. This information is not intended to replace advice given to you by your health care provider. Make sure you discuss any questions you have with your health care provider. Document Released: 09/16/2015 Document Revised: 05/09/2016 Document Reviewed: 06/21/2015 Elsevier Interactive Patient Education  2017 [employer] Prevention in the Home Falls can cause injuries. They can happen to people of all ages. There are many things you can do to make your home safe and to help prevent falls. What can I do on the outside of my home?  Regularly fix the edges of walkways and driveways and fix any cracks.  Remove anything that might make you trip as you walk through a door, such as a raised step or  threshold.  Trim any bushes or trees on the path to your home.  Use bright outdoor lighting.  Clear any walking paths of anything that might make someone trip, such as rocks or tools.  Regularly check to see if handrails are loose or broken. Make sure that both sides of any steps have handrails.  Any raised decks and porches should have guardrails on the edges.  Have any leaves, snow, or ice cleared regularly.  Use sand or salt on walking paths during winter.  Clean up any spills in your garage right away. This includes oil or grease spills. What can I do in the bathroom?  Use night lights.  Install grab bars by the toilet and in the tub and shower. Do not use towel bars as grab bars.  Use non-skid mats or decals in the tub or shower.  If you need to sit down in the shower, use a plastic, non-slip stool.  Keep the floor dry. Clean up any water that spills on the floor as soon as it happens.  Remove soap buildup in the tub or shower regularly.  Attach bath mats securely with double-sided non-slip rug tape.  Do not have throw rugs and other things on the floor that can make you trip. What can I do in the bedroom?  Use night lights.  Make sure that you have a light by your bed that is easy to reach.  Do not use any sheets  or blankets that are too big for your bed. They should not hang down onto the floor.  Have a firm chair that has side arms. You can use this for support while you get dressed.  Do not have throw rugs and other things on the floor that can make you trip. What can I do in the kitchen?  Clean up any spills right away.  Avoid walking on wet floors.  Keep items that you use a lot in easy-to-reach places.  If you need to reach something above you, use a strong step stool that has a grab bar.  Keep electrical cords out of the way.  Do not use floor polish or wax that makes floors slippery. If you must use wax, use non-skid floor wax.  Do not have  throw rugs and other things on the floor that can make you trip. What can I do with my stairs?  Do not leave any items on the stairs.  Make sure that there are handrails on both sides of the stairs and use them. Fix handrails that are broken or loose. Make sure that handrails are as long as the stairways.  Check any carpeting to make sure that it is firmly attached to the stairs. Fix any carpet that is loose or worn.  Avoid having throw rugs at the top or bottom of the stairs. If you do have throw rugs, attach them to the floor with carpet tape.  Make sure that you have a light switch at the top of the stairs and the bottom of the stairs. If you do not have them, ask someone to add them for you. What else can I do to help prevent falls?  Wear shoes that:  Do not have high heels.  Have rubber bottoms.  Are comfortable and fit you well.  Are closed at the toe. Do not wear sandals.  If you use a stepladder:  Make sure that it is fully opened. Do not climb a closed stepladder.  Make sure that both sides of the stepladder are locked into place.  Ask someone to hold it for you, if possible.  Clearly mark and make sure that you can see:  Any grab bars or handrails.  First and last steps.  Where the edge of each step is.  Use tools that help you move around (mobility aids) if they are needed. These include:  Canes.  Walkers.  Scooters.  Crutches.  Turn on the lights when you go into a dark area. Replace any light bulbs as soon as they burn out.  Set up your furniture so you have a clear path. Avoid moving your furniture around.  If any of your floors are uneven, fix them.  If there are any pets around you, be aware of where they are.  Review your medicines with your doctor. Some medicines can make you feel dizzy. This can increase your chance of falling. Ask your doctor what other things that you can do to help prevent falls. This information is not intended to  replace advice given to you by your health care provider. Make sure you discuss any questions you have with your health care provider. Document Released: 06/16/2009 Document Revised: 01/26/2016 Document Reviewed: 09/24/2014 Elsevier Interactive Patient Education  2017 [employer].

## 2020-09-12 NOTE — Progress Notes (Signed)
Subjective:  Patient ID: [name] [name], female    DOB: 1941/04/30  Age: 80 y.o. MRN: [id number]  CC: Annual Exam, Hypertension, Hyperlipidemia, and Diabetes  This visit occurred during the SARS-CoV-2 public health emergency.  Safety protocols were in place, including screening questions prior to the visit, additional usage of staff PPE, and extensive cleaning of exam room while observing appropriate contact time as indicated for disinfecting solutions.    HPI [name] [name] presents for a CPX.  She complains of right hip and thigh pain after twisting the area about a week ago.  She can bear weight on the lower extremity without difficulty.  She denies back pain or paresthesias.  She also has large joints pain and is not getting much symptom relief with Tylenol.  She is active and denies any recent episodes of headache, blurred vision, chest pain, shortness of breath, palpitations, edema, or fatigue.  Outpatient Medications Prior to Visit  Medication Sig Dispense Refill   Ascorbic Acid (VITAMIN C) 500 MG CAPS Take 500 mg by mouth in the morning and at bedtime.     aspirin EC 81 MG tablet Take 81 mg by mouth every morning.      atorvastatin (LIPITOR) 80 MG tablet TAKE ONE TABLET BY MOUTH EVERYDAY AT BEDTIME 90 tablet 1   Cholecalciferol (VITAMIN D3) 1000 UNITS CAPS Take 1,000 Units by mouth daily.     clopidogrel (PLAVIX) 75 MG tablet TAKE ONE TABLET BY MOUTH EVERY MORNING 90 tablet 1   DEXILANT 60 MG capsule TAKE ONE TABLET BY MOUTH EVERY MORNING 90 capsule 1   dicyclomine (BENTYL) 10 MG capsule TAKE ONE TABLET BY MOUTH FOUR TIMES DAILY 360 capsule 1   ezetimibe (ZETIA) 10 MG tablet TAKE ONE TABLET BY MOUTH EVERY MORNING 90 tablet 1   gabapentin (NEURONTIN) 300 MG capsule TAKE TWO CAPSULES BY MOUTH BY MOUTH EVERY MORNING and TAKE TWO CAPSULES BY MOUTH EVERYDAY AT BEDTIME 360 capsule 1   isosorbide mononitrate (IMDUR) 30 MG 24 hr tablet Take 1 tablet (30 mg total) by mouth daily.  90 tablet 3   Krill Oil 1000 MG CAPS Take 1,000 mg by mouth daily.     metoprolol succinate (TOPROL-XL) 50 MG 24 hr tablet Take 1 tablet (50 mg total) by mouth daily. 90 tablet 3   nitroGLYCERIN (NITROSTAT) 0.4 MG SL tablet Place 1 tablet (0.4 mg total) under the tongue every 5 (five) minutes as needed for chest pain. 25 tablet 2   Omega-3 Fatty Acids (FISH OIL) 1000 MG CAPS Take 1 capsule by mouth 2 (two) times daily.      promethazine (PHENERGAN) 12.5 MG tablet Take 1 tablet (12.5 mg total) by mouth every 6 (six) hours as needed for nausea or vomiting. 15 tablet 0   venlafaxine XR (EFFEXOR-XR) 37.5 MG 24 hr capsule TAKE ONE CAPSULE BY MOUTH EVERY MORNING 90 capsule 1   olmesartan (BENICAR) 40 MG tablet TAKE ONE TABLET BY MOUTH EVERY MORNING 90 tablet 1   No facility-administered medications prior to visit.    ROS Review of Systems  Constitutional: Negative.  Negative for diaphoresis and fatigue.  HENT: Negative.   Eyes: Negative.   Respiratory: Negative for cough, chest tightness, shortness of breath and wheezing.   Cardiovascular: Negative for chest pain, palpitations and leg swelling.  Gastrointestinal: Negative for abdominal pain, constipation, diarrhea, nausea and vomiting.  Endocrine: Negative.   Genitourinary: Negative.  Negative for difficulty urinating.  Musculoskeletal: Positive for arthralgias. Negative for back pain and  myalgias.  Skin: Negative.  Negative for color change and pallor.  Neurological: Negative.  Negative for dizziness, weakness, light-headedness, numbness and headaches.  Hematological: Negative for adenopathy. Does not bruise/bleed easily.  Psychiatric/Behavioral: Negative.     Objective:  BP 126/80    Pulse 80    Temp 98.1 F (36.7 C) (Oral)    Resp 16    Ht 5' (1.524 m)    Wt 127 lb (57.6 kg)    SpO2 95%    BMI 24.80 kg/m   BP Readings from Last 3 Encounters:  09/12/20 126/80  09/12/20 128/60  07/11/20 140/72    Wt Readings from Last 3  Encounters:  09/12/20 127 lb (57.6 kg)  09/12/20 127 lb 6.4 oz (57.8 kg)  07/11/20 126 lb 6.4 oz (57.3 kg)    Physical Exam Vitals reviewed.  Constitutional:      Appearance: Normal appearance.  HENT:     Nose: Nose normal.     Mouth/Throat:     Mouth: Mucous membranes are moist.  Eyes:     General: No scleral icterus.    Conjunctiva/sclera: Conjunctivae normal.  Cardiovascular:     Rate and Rhythm: Normal rate and regular rhythm.     Pulses: Normal pulses.     Heart sounds: No murmur heard.   Pulmonary:     Effort: Pulmonary effort is normal.     Breath sounds: No stridor. No wheezing, rhonchi or rales.  Abdominal:     General: Abdomen is flat. Bowel sounds are normal. There is no distension.     Palpations: Abdomen is soft. There is no hepatomegaly, splenomegaly or mass.  Musculoskeletal:     Cervical back: Normal and neck supple.     Thoracic back: Normal.     Lumbar back: Normal.     Right hip: Normal.     Left hip: Normal.     Right upper leg: Normal. No swelling, edema, deformity, tenderness or bony tenderness.  Lymphadenopathy:     Cervical: No cervical adenopathy.  Neurological:     Mental Status: She is alert.     Lab Results  Component Value Date   WBC 7.4 09/12/2020   HGB 13.3 09/12/2020   HCT 40.3 09/12/2020   PLT 244.0 09/12/2020   GLUCOSE 193 (H) 09/12/2020   CHOL 112 09/12/2020   TRIG 244.0 (H) 09/12/2020   HDL 48.80 09/12/2020   LDLDIRECT 42.0 09/12/2020   LDLCALC 26 04/30/2019   ALT 19 09/12/2020   AST 20 09/12/2020   NA 136 09/12/2020   K 5.5 (H) 09/12/2020   CL 100 09/12/2020   CREATININE 0.90 09/12/2020   BUN 15 09/12/2020   CO2 35 (H) 09/12/2020   TSH 1.54 09/12/2020   INR 1.09 01/23/2018   HGBA1C 7.2 (H) 09/12/2020   MICROALBUR 1.9 09/12/2020    No results found.  Assessment & Plan:   [name] [name] was seen today for annual exam, hypertension, hyperlipidemia and diabetes.  Diagnoses and all orders for this visit:  Essential  hypertension- Her blood pressure is well controlled but she has developed hyperkalemia.  I have asked her to stop taking the ARB.  I have asked her to return in 2 weeks to recheck the potassium level and for an EKG. -     CBC with Differential/Platelet; Future -     Basic metabolic panel; Future -     TSH; Future -     Urinalysis, Routine w reflex microscopic; Future -     Urinalysis,  Routine w reflex microscopic -     TSH -     Basic metabolic panel -     CBC with Differential/Platelet  Controlled type 2 diabetes mellitus with complication, without long-term current use of insulin ([place])- Her blood sugar is adequately well controlled. -     Basic metabolic panel; Future -     Microalbumin / creatinine urine ratio; Future -     Hemoglobin A1c; Future -     Hemoglobin A1c -     Microalbumin / creatinine urine ratio -     Basic metabolic panel  Hyperlipidemia with target LDL less than 100- She has achieved her LDL goal and is doing well on the statin. -     Lipid panel; Future -     Hepatic function panel; Future -     TSH; Future -     TSH -     Hepatic function panel -     Lipid panel  Injury of right thigh, initial encounter- Based on her symptoms, exam, and plain films I think she has a musculoskeletal strain.  Will add celecoxib to the Tylenol. -     DG FEMUR, MIN 2 VIEWS RIGHT; Future  Primary osteoarthritis involving multiple joints -     celecoxib (CELEBREX) 50 MG capsule; Take 1 capsule (50 mg total) by mouth 2 (two) times daily.  Routine general medical examination at a health care facility- Exam completed, labs reviewed, vaccines reviewed and updated, no cancer screenings are indicated, patient education was given.  Acute hyperkalemia- See above.  Stage 3b chronic kidney disease ([place])- Will continue to maintain control of her blood pressure.  Other orders -     LDL cholesterol, direct   I have discontinued [name] F. Ciaravino "FAYE"'s olmesartan. I am also having her  start on celecoxib. Additionally, I am having her maintain her Fish Oil, Vitamin D3, Vitamin C, aspirin EC, metoprolol succinate, Krill Oil, nitroGLYCERIN, isosorbide mononitrate, promethazine, venlafaxine XR, dicyclomine, clopidogrel, ezetimibe, Dexilant, atorvastatin, and gabapentin.  Meds ordered this encounter  Medications   celecoxib (CELEBREX) 50 MG capsule    Sig: Take 1 capsule (50 mg total) by mouth 2 (two) times daily.    Dispense:  180 capsule    Refill:  0   In addition to time spent on CPE, I spent 50 minutes in preparing to see the patient by review of recent labs, imaging and procedures, obtaining and reviewing separately obtained history, communicating with the patient and family or caregiver, ordering medications, tests or procedures, and documenting clinical information in the EHR including the differential Dx, treatment, and any further evaluation and other management of 1. Essential hypertension 2. Controlled type 2 diabetes mellitus with complication, without long-term current use of insulin ([place]) 3. Hyperlipidemia with target LDL less than 100 4. Injury of right thigh, initial encounter 5. Primary osteoarthritis involving multiple joints 6. Acute hyperkalemia 7. Stage 3b chronic kidney disease ([place])     Follow-up: Return in about 6 months (around 03/12/2021).  [name], MD

## 2020-09-12 NOTE — Progress Notes (Signed)
Subjective:   [name] [name] is a 80 y.o. female who presents for Medicare Annual (Subsequent) preventive examination.  Review of Systems    No ROS. Medicare Wellness Visit. Additional risk factors are reflected in social history. Cardiac Risk Factors include: advanced age (>29mn, >>47women);diabetes mellitus;dyslipidemia;family history of premature cardiovascular disease;hypertension     Objective:    Today's Vitals   09/12/20 1417  BP: 128/60  Pulse: 78  Temp: 98.3 F (36.8 C)  SpO2: 94%  Weight: 127 lb 6.4 oz (57.8 kg)  Height: 5' (1.524 m)  PainSc: 6   PainLoc: Leg   Body mass index is 24.88 kg/m.  Advanced Directives 09/12/2020 06/26/2020 03/09/2020 06/28/2016 04/22/2016 03/10/2015 04/02/2012  Does Patient Have a Medical Advance Directive? Yes No Yes No Yes Yes Patient does not have advance directive;Patient would like information  Type of Advance Directive Living will;[place]care Power of [place] - -  Does patient want to make changes to medical advance directive? No - Patient declined - - - No - Patient declined - -  Copy of [place] Chart? No - copy requested - - - Yes Yes -  Would patient like information on creating a medical advance directive? - - - - - - Advance directive packet given  Pre-existing out of facility DNR order (yellow form or pink MOST form) - - - - - - No    Current Medications (verified) Outpatient Encounter Medications as of 09/12/2020  Medication Sig  . Cinnamon 500 MG TABS Take 1,000 mg by mouth. Once a day  . Ginger, Zingiber officinalis, (GINGER ROOT) 550 MG CAPS Take 550 mg by mouth. 1 tablet twice a day  . magnesium oxide (MAG-OX) 400 MG tablet Take 400 mg by mouth daily.  . Misc Natural Products (FOCUSED MIND PO) Take 15 mg by mouth 1 day or 1 dose.  . omega-3 acid ethyl esters (LOVAZA) 1 g capsule Take 1 g by mouth 2 (two) times  daily.  . Psyllium (DAILY FIBER PO) Take by mouth in the morning and at bedtime.  . saccharomyces boulardii (FLORASTOR) 250 MG capsule Take 250 mg by mouth 2 (two) times daily.  . TURMERIC PO Take 1,000 mg by mouth. One tablet po twice a day  . Vitamin D, Ergocalciferol, (DRISDOL) 1.25 MG (50000 UNIT) CAPS capsule Take 50,000 Units by mouth every 7 (seven) days.  . vitamin E 1000 UNIT capsule Take 1,000 Units by mouth in the morning and at bedtime.  . Ascorbic Acid (VITAMIN C) 500 MG CAPS Take 500 mg by mouth in the morning and at bedtime.  .Marland Kitchenaspirin EC 81 MG tablet Take 81 mg by mouth every morning.   .Marland Kitchenatorvastatin (LIPITOR) 80 MG tablet TAKE ONE TABLET BY MOUTH EVERYDAY AT BEDTIME  . Cholecalciferol (VITAMIN D3) 1000 UNITS CAPS Take 1,000 Units by mouth daily.  . clopidogrel (PLAVIX) 75 MG tablet TAKE ONE TABLET BY MOUTH EVERY MORNING  . DEXILANT 60 MG capsule TAKE ONE TABLET BY MOUTH EVERY MORNING  . dicyclomine (BENTYL) 10 MG capsule TAKE ONE TABLET BY MOUTH FOUR TIMES DAILY  . ezetimibe (ZETIA) 10 MG tablet TAKE ONE TABLET BY MOUTH EVERY MORNING  . gabapentin (NEURONTIN) 300 MG capsule TAKE TWO CAPSULES BY MOUTH BY MOUTH EVERY MORNING and TAKE TWO CAPSULES BY MOUTH EVERYDAY AT BEDTIME  . isosorbide mononitrate (IMDUR) 30 MG 24 hr tablet Take 1 tablet (30  mg total) by mouth daily.  Javier Docker Oil 1000 MG CAPS Take 1,000 mg by mouth daily.  . metoprolol succinate (TOPROL-XL) 50 MG 24 hr tablet Take 1 tablet (50 mg total) by mouth daily.  . nitroGLYCERIN (NITROSTAT) 0.4 MG SL tablet Place 1 tablet (0.4 mg total) under the tongue every 5 (five) minutes as needed for chest pain.  Marland Kitchen olmesartan (BENICAR) 40 MG tablet TAKE ONE TABLET BY MOUTH EVERY MORNING  . Omega-3 Fatty Acids (FISH OIL) 1000 MG CAPS Take 1 capsule by mouth 2 (two) times daily.   . promethazine (PHENERGAN) 12.5 MG tablet Take 1 tablet (12.5 mg total) by mouth every 6 (six) hours as needed for nausea or vomiting.  . venlafaxine XR  (EFFEXOR-XR) 37.5 MG 24 hr capsule TAKE ONE CAPSULE BY MOUTH EVERY MORNING   No facility-administered encounter medications on file as of 09/12/2020.    Allergies (verified) Nitrofurantoin, Niacin and related, Ceftriaxone, Codeine, Prednisone, Sulfonamide derivatives, and Tetracycline   History: Past Medical History:  Diagnosis Date  . Allergy   . Anxiety   . CAD (coronary artery disease)    s/p inf MI 2007 - tx w/ DES to RCA // Echo 12/08: EF 60%, mild MR, mild LAE // LHC 08/2009: dLM 20%, LAD beyond diagonal 60-70%, stable from prior catheterization in 2007, ostial circumflex 40-50%, mid RCA stent ok, EF 65% // Myoview 08/2009: EF 76%, small inferoapical fixed defect, no ischemia // Myoview 7/13: no scar or ischemia, EF 69% //    . Cataract    removed both eyes  . Cerebrovascular disease, unspecified   . Chronic kidney disease    frequent Kidney Infections  . Chronic neck pain   . Chronic pain syndrome   . Diverticular disease   . Dizziness   . DJD (degenerative joint disease)   . DM type 2 (diabetes mellitus, type 2) ([place])   . Echocardiogram    Echo 10/2018: EF 55-60, normal RVSF, mod MAC, mod TR, severe AoV calcification and sclerosis with nodular calcium/mobile area of calcium in the LVOT (small veg vs Lambl's excrescence  - consider TEE), mild AI, mild AS (mean 11).   . Echocardiogram 04/2019   Echocardiogram 04/2019: EF 60-65, basal septal hypertrophy, grade 2 diastolic dysfunction, normal wall motion, normal RV SF, mild LAE, mod MAC, trivial MR, mod sclerosis of the aortic valve with mod aortic annular calcification, thin mobile filamentous structure on ventricular side of AV likely representing Lambl's excrescence, mild AI, mild TR  . GERD (gastroesophageal reflux disease)   . H/O hiatal hernia   . HTN (hypertension)   . IBS (irritable bowel syndrome)   . 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 Internal hemorrhoids   . Ischemic colitis ([place])   . Mitral valve  prolapse   . Mixed hyperlipidemia   . Myocardial infarction ([place]) 2007  . Neuromuscular disorder ([place])    hiatal hernia  . Nocturia   . Pancreatitis    1955 an once more  . PONV (postoperative nausea and vomiting)    Difficluty opening mouth wide and turning head. (Cervical Fusion)  . Premature ventricular contractions   . Tubular adenoma of colon   . Ulcer    sam [place] [facility]  . Urinary incontinence    Past Surgical History:  Procedure Laterality Date  . APPENDECTOMY  1955  . BLADDER REPAIR  2007  . CARDIAC CATHETERIZATION  2007   Stents  . CERVICAL SPINE SURGERY  07/2005   Dr Consuello Masse  .  CHOLECYSTECTOMY  2007  . COLONOSCOPY    . CORONARY STENT INTERVENTION N/A 03/09/2020   Procedure: CORONARY STENT INTERVENTION;  Surgeon: Burnell Blanks, MD;  Location: [facility];  Service: Cardiovascular;  Laterality: N/A;  . DENTAL SURGERY  05/2013   replaced an inplant  . EYE SURGERY  2011   Bilteral  . I & D KNEE WITH POLY EXCHANGE  09/25/2011   Procedure: IRRIGATION AND DEBRIDEMENT KNEE WITH POLY EXCHANGE;  Surgeon: Johnny Bridge, MD;  Location: [place] [facility];  Service: Orthopedics;  Laterality: Left;  . INCISION AND DRAINAGE ABSCESS / HEMATOMA OF BURSA / KNEE / THIGH  09/2011  . JOINT REPLACEMENT  2012   left  . LEFT HEART CATH AND CORONARY ANGIOGRAPHY N/A 03/09/2020   Procedure: LEFT HEART CATH AND CORONARY ANGIOGRAPHY;  Surgeon: Burnell Blanks, MD;  Location: [facility];  Service: Cardiovascular;  Laterality: N/A;  . left Total Knee Replacement  11/2010   Dr Percell Miller  . NECK SURGERY     has had 3 surgeries  . POLYPECTOMY    . POSTERIOR CERVICAL FUSION/FORAMINOTOMY  04/08/2012   Procedure: POSTERIOR CERVICAL FUSION/FORAMINOTOMY LEVEL 2;  Surgeon: Otilio Connors, MD;  Location: [facility];  Service: Neurosurgery;  Laterality: Left;  Left Cervical six-seven Foraminotomy, bilateral cervical seven-thoracic one foraminotomy, cervical six-seven, cervical seven-thoracic one  fusion with posterior instrumentation  . TEMPOROMANDIBULAR JOINT SURGERY    . thumb surgery    . TONSILLECTOMY    . TONSILLECTOMY  1950  . TOTAL ABDOMINAL HYSTERECTOMY     Family History  Problem Relation Age of Onset  . Heart disease Mother   . Pancreatic cancer Mother   . Cancer - Other Brother        vocal cord cancer  . Colon polyps Sister   . Coronary artery disease Other        sibling  . Coronary artery disease Other        sibling  . Colon cancer Neg Hx   . Esophageal cancer Neg Hx   . Rectal cancer Neg Hx   . Stomach cancer Neg Hx    Social History   Socioeconomic History  . Marital status: Widowed    Spouse name: dolly Zeringue  . Number of children: Not on file  . Years of education: Not on file  . Highest education level: Not on file  Occupational History    Employer: RETIRED  Tobacco Use  . Smoking status: Never Smoker  . Smokeless tobacco: Never Used  Vaping Use  . Vaping Use: Never used  Substance and Sexual Activity  . Alcohol use: No  . Drug use: No  . Sexual activity: Yes  Other Topics Concern  . Not on file  Social History Narrative   Pt never knew her father   Daily caffeine use   Social Determinants of Health   Financial Resource Strain: Low Risk   . Difficulty of Paying Living Expenses: Not hard at all  Food Insecurity: No Food Insecurity  . Worried About Charity fundraiser in the Last Year: Never true  . Ran Out of Food in the Last Year: Never true  Transportation Needs: No Transportation Needs  . Lack of Transportation (Medical): No  . Lack of Transportation (Non-Medical): No  Physical Activity: Sufficiently Active  . Days of Exercise per Week: 5 days  . Minutes of Exercise per Session: 30 min  Stress: No Stress Concern Present  . Feeling of  Stress : Not at all  Social Connections: Moderately Integrated  . Frequency of Communication with Friends and Family: More than three times a week  . Frequency of Social Gatherings with  Friends and Family: More than three times a week  . Attends Religious Services: More than 4 times per year  . Active Member of Clubs or Organizations: Yes  . Attends Archivist Meetings: More than 4 times per year  . Marital Status: Widowed    Tobacco Counseling Counseling given: Not Answered   Clinical Intake:  Pre-visit preparation completed: Yes  Pain : 0-10 Pain Score: 6  Pain Type: Acute pain Pain Location: Leg Pain Orientation: Right Pain Radiating Towards: n/a Pain Onset: 1 to 4 weeks ago Pain Frequency: Intermittent Pain Relieving Factors: epsom salt with lavender Effect of Pain on Daily Activities: Pain can diminish job performance, lower motivation to exercise, and prevent you from completing daily tasks. Pain produces disability and affects the quality of life.  Pain Relieving Factors: epsom salt with lavender  BMI - recorded: 24.88 Nutritional Status: BMI of 19-24  Normal Nutritional Risks: None Diabetes: Yes CBG done?: No Did pt. bring in CBG monitor from home?: No  How often do you need to have someone help you when you read instructions, pamphlets, or other written materials from your doctor or pharmacy?: 1 - Never What is the last grade level you completed in school?: 10th grade  Diabetic? yes     Information entered by :: Lisette Abu, LPN   Activities of Daily Living In your present state of health, do you have any difficulty performing the following activities: 09/12/2020 09/12/2020  Hearing? N N  Vision? N N  Difficulty concentrating or making decisions? N N  Walking or climbing stairs? N N  Dressing or bathing? N N  Doing errands, shopping? N N  Preparing Food and eating ? N -  Using the Toilet? N -  In the past six months, have you accidently leaked urine? Y -  Comment wears protection -  Do you have problems with loss of bowel control? N -  Managing your Medications? N -  Managing your Finances? N -  Housekeeping or  managing your Housekeeping? N -  Some recent data might be hidden    Patient Care Team: Janith Lima, MD as PCP - General (Internal Medicine) Sherren Mocha, MD as PCP - Cardiology (Cardiology) Sherren Mocha, MD as Consulting Physician (Cardiology) Tat, Eustace Quail, DO as Consulting Physician (Neurology) Marchia Bond, MD as Consulting Physician (Orthopedic Surgery) Charlton Haws, [facility] (Pharmacist)  Indicate any recent Medical Services you may have received from other than [facility] providers in the past year (date may be approximate).     Assessment:   This is a routine wellness examination for [facility].  Hearing/Vision screen No exam data present  Dietary issues and exercise activities discussed: Current Exercise Habits: Home exercise routine, Type of exercise: walking;Other - see comments (yard work, house work), Time (Minutes): 30, Frequency (Times/Week): 5, Weekly Exercise (Minutes/Week): 150, Intensity: Moderate, Exercise limited by: orthopedic condition(s)  Goals    .  continue to maintain her health (pt-stated)      Continue to take time for self in lieu of taking care of spouse. Continue exercise;  BMI; normal; eats well; loves vegetables and fruits    .  Patient Stated (pt-stated)      My goal is to continue to stay healthy and continue to eat right.    Marland Kitchen  Pharmacy Care Plan      CARE PLAN ENTRY (see longitudinal plan of care for additional care plan information)  Current Barriers:  . Chronic Disease Management support, education, and care coordination needs related to Hypertension, Hyperlipidemia, Diabetes, and Coronary Artery Disease   Hypertension BP Readings from Last 3 Encounters:  06/27/20 (!) 156/82  06/26/20 (!) 184/74  05/17/20 (!) 114/58 .  Pharmacist Clinical Goal(s): o Over the next 180 days, patient will work with PharmD and providers to maintain BP goal <140/90 . Current regimen:  o metoprolol succinate 50 mg daily,  o olmesartan 40 mg  daily, o isosorbide MN 30 mg daily . Interventions: o Discussed BP goals and benefits of medication for prevention of heart attack / stroke . Patient self care activities - Over the next 180 days, patient will: o Check BP daily, document, and provide at future appointments o Ensure daily salt intake < 2300 mg/day  Hyperlipidemia / Coronary artery disease Lab Results  Component Value Date/Time   LDLCALC 26 04/30/2019 11:34 AM   LDLDIRECT 68.0 03/10/2015 03:30 PM .  Pharmacist Clinical Goal(s): o Over the next 180 days, patient will work with PharmD and providers to maintain LDL goal < 70 . Current regimen:  o atorvastatin 80 mg daily o ezetimibe 10 mg daily,  o nitroglycerin 0.4 mg as needed o aspirin 81 mg daily,  o clopidogrel 75 mg daily,  o fish oil OTC  . Interventions: o Discussed cholesterol goals and benefits of medication for prevention of heart attack / stroke . Patient self care activities - Over the next 180 days, patient will: o Continue medication as prescribed o Use Nitroglycerin as needed for chest pain; if pain is not relieved by 3 doses 5 min apart, seek medical attention  Diabetes Lab Results  Component Value Date/Time   HGBA1C 6.7 (A) 03/03/2020 01:44 PM   HGBA1C 6.6 (H) 09/29/2019 02:36 PM   HGBA1C 6.5 04/30/2019 11:34 AM .  Pharmacist Clinical Goal(s): o Over the next 180 days, patient will work with PharmD and providers to maintain A1c goal <7% . Current regimen:  o No medications needed . Interventions: o Discussed A1c goals and benefits of diet/exercise for prevention of diabetic complications. Patient is doing well off of metformin. . Patient self care activities - Over the next 180 days, patient will: o Continue low-sugar diet and exercise routine  Medication management . Pharmacist Clinical Goal(s): o Over the next 180 days, patient will work with PharmD and providers to achieve optimal medication adherence . Current pharmacy:  [facility] . Interventions o Comprehensive medication review performed. o Utilize [facility] pharmacy for medication synchronization, packaging and delivery . Patient self care activities - Over the next 180 days, patient will: o Focus on medication adherence by pill pack o Take medications as prescribed o Report any questions or concerns to PharmD and/or provider(s)  Please see past updates related to this goal by clicking on the "Past Updates" button in the selected goal       Depression Screen PHQ 2/9 Scores 09/12/2020 03/03/2020 04/30/2019 07/15/2018 04/22/2016 03/10/2015 11/08/2014  PHQ - 2 Score 0 0 0 0 0 0 0  PHQ- 9 Score - - 0 1 - - -    Fall Risk Fall Risk  09/12/2020 03/03/2020 04/30/2019 07/15/2018 04/22/2016  Falls in the past year? 1 0 0 1 No  Number falls in past yr: 0 0 0 1 -  Comment - - - - -  Injury with Fall? 0 0  0 0 -  Risk Factor Category  - - - - -  Risk for fall due to : - No Fall Risks;Impaired balance/gait - Impaired mobility -  Risk for fall due to: Comment - - - - -  Follow up - Falls evaluation completed Falls evaluation completed Falls evaluation completed;Falls prevention discussed -    FALL RISK PREVENTION PERTAINING TO THE HOME:  Any stairs in or around the home? Yes  If so, are there any without handrails? No  Home free of loose throw rugs in walkways, pet beds, electrical cords, etc? Yes  Adequate lighting in your home to reduce risk of falls? Yes   ASSISTIVE DEVICES UTILIZED TO PREVENT FALLS:  Life alert? No  Use of a cane, walker or w/c? No  Grab bars in the bathroom? Yes  Shower chair or bench in shower? No  Elevated toilet seat or a handicapped toilet? No   TIMED UP AND GO:  Was the test performed? No .  Length of time to ambulate 10 feet: 0 sec.   Gait steady and fast without use of assistive device  Cognitive Function: Normal cognitive status assessed by direct observation by this Nurse Health Advisor. No abnormalities found.           Immunizations Immunization History  Administered Date(s) Administered  . Fluad Quad(high Dose 65+) 04/30/2019, 06/27/2020  . Influenza Split 07/18/2011, 05/19/2012  . Influenza Whole 06/22/2009, 05/15/2010  . Influenza, High Dose Seasonal PF 07/20/2015, 05/24/2016, 06/18/2018  . Influenza,inj,Quad PF,6+ Mos 06/15/2013, 05/21/2014  . Influenza-Unspecified 06/24/2017  . Pneumococcal Conjugate-13 01/20/2014  . Pneumococcal Polysaccharide-23 11/08/2014, 11/13/2017  . Tdap 01/20/2014  . Zoster 07/08/2014    TDAP status: Up to date  Flu Vaccine status: Up to date  Pneumococcal vaccine status: Up to date  Covid-19 vaccine status: Declined, Education has been provided regarding the importance of this vaccine but patient still declined. Advised may receive this vaccine at local pharmacy or Health Dept.or vaccine clinic. Aware to provide a copy of the vaccination record if obtained from local pharmacy or Health Dept. Verbalized acceptance and understanding.  Qualifies for Shingles Vaccine? Yes   Zostavax completed Yes   Shingrix Completed?: No.    Education has been provided regarding the importance of this vaccine. Patient has been advised to call insurance company to determine out of pocket expense if they have not yet received this vaccine. Advised may also receive vaccine at local pharmacy or Health Dept. Verbalized acceptance and understanding.  Screening Tests Health Maintenance  Topic Date Due  . COVID-19 Vaccine (1) Never done  . OPHTHALMOLOGY EXAM  04/26/2020  . FOOT EXAM  09/29/2020  . TETANUS/TDAP  01/21/2024  . INFLUENZA VACCINE  Completed  . DEXA SCAN  Completed  . PNA vac Low Risk Adult  Completed    Health Maintenance  Health Maintenance Due  Topic Date Due  . COVID-19 Vaccine (1) Never done  . OPHTHALMOLOGY EXAM  04/26/2020    Colorectal cancer screening: Type of screening: Colonoscopy. Completed 06/28/2016. Repeat every 5 years  Mammogram status: No longer  required due to age (patient refused).  Bone Density status: Completed 07/29/2012. Results reflect: Bone density results: NORMAL. Repeat every 5 years.  Lung Cancer Screening: (Low Dose CT Chest recommended if Age 38-80 years, 30 pack-year currently smoking OR have quit w/in 15years.) does not qualify.   Lung Cancer Screening Referral: no  Additional Screening:  Hepatitis C Screening: does not qualify; Completed no  Vision Screening: Recommended annual  ophthalmology exams for early detection of glaucoma and other disorders of the eye. Is the patient up to date with their annual eye exam?  Yes  Who is the provider or what is the name of the office in which the patient attends annual eye exams? [facility]) If pt is not established with a provider, would they like to be referred to a provider to establish care? No .   Dental Screening: Recommended annual dental exams for proper oral hygiene  Community Resource Referral / Chronic Care Management: CRR required this visit?  No   CCM required this visit?  No      Plan:     I have personally reviewed and noted the following in the patient's chart:   . Medical and social history . Use of alcohol, tobacco or illicit drugs  . Current medications and supplements . Functional ability and status . Nutritional status . Physical activity . Advanced directives . List of other physicians . Hospitalizations, surgeries, and ER visits in previous 12 months . Vitals . Screenings to include cognitive, depression, and falls . Referrals and appointments  In addition, I have reviewed and discussed with patient certain preventive protocols, quality metrics, and best practice recommendations. A written personalized care plan for preventive services as well as general preventive health recommendations were provided to patient.     Sheral Flow, LPN   [id number]/72/[id number]   Nurse Notes: n/a

## 2020-09-13 DIAGNOSIS — E875 Hyperkalemia: Secondary | ICD-10-CM | POA: Insufficient documentation

## 2020-09-13 DIAGNOSIS — N1832 Chronic kidney disease, stage 3b: Secondary | ICD-10-CM | POA: Insufficient documentation

## 2020-09-13 LAB — MICROALBUMIN / CREATININE URINE RATIO
Creatinine,U: 83.2 mg/dL
Microalb Creat Ratio: 2.3 mg/g (ref 0.0–30.0)
Microalb, Ur: 1.9 mg/dL (ref 0.0–1.9)

## 2020-09-13 LAB — HM DIABETES EYE EXAM

## 2020-09-13 LAB — LIPID PANEL
Cholesterol: 112 mg/dL (ref 0–200)
HDL: 48.8 mg/dL (ref >39.00)
NonHDL: 63.17
Total CHOL/HDL Ratio: 2
Triglycerides: 244 mg/dL — ABNORMAL HIGH (ref 0.0–149.0)
VLDL: 48.8 mg/dL — ABNORMAL HIGH (ref 0.0–40.0)

## 2020-09-14 ENCOUNTER — Encounter: Payer: Self-pay | Admitting: Internal Medicine

## 2020-09-15 ENCOUNTER — Telehealth: Payer: Self-pay | Admitting: Pharmacist

## 2020-09-15 NOTE — Progress Notes (Signed)
Chronic Care Management Pharmacy Assistant   Name: [name] [name]  MRN: [id number] DOB: 1940/09/13  Reason for Encounter: Medication Review   PCP : Janith Lima, MD  Allergies:   Allergies  Allergen Reactions  . Nitrofurantoin Nausea Only    Severe nausea  . Niacin And Related Other (See Comments)    Must take "Flush-free"  . Ceftriaxone Itching  . Codeine Nausea Only  . Prednisone Itching and Rash  . Sulfonamide Derivatives Itching  . Tetracycline Itching and Rash    Medications: Outpatient Encounter Medications as of 09/15/2020  Medication Sig  . Ascorbic Acid (VITAMIN C) 500 MG CAPS Take 500 mg by mouth in the morning and at bedtime.  Marland Kitchen aspirin EC 81 MG tablet Take 81 mg by mouth every morning.   Marland Kitchen atorvastatin (LIPITOR) 80 MG tablet TAKE ONE TABLET BY MOUTH EVERYDAY AT BEDTIME  . celecoxib (CELEBREX) 50 MG capsule Take 1 capsule (50 mg total) by mouth 2 (two) times daily.  . Cholecalciferol (VITAMIN D3) 1000 UNITS CAPS Take 1,000 Units by mouth daily.  . Cinnamon 500 MG TABS Take 1,000 mg by mouth. Once a day  . clopidogrel (PLAVIX) 75 MG tablet TAKE ONE TABLET BY MOUTH EVERY MORNING  . DEXILANT 60 MG capsule TAKE ONE TABLET BY MOUTH EVERY MORNING  . dicyclomine (BENTYL) 10 MG capsule TAKE ONE TABLET BY MOUTH FOUR TIMES DAILY  . ezetimibe (ZETIA) 10 MG tablet TAKE ONE TABLET BY MOUTH EVERY MORNING  . gabapentin (NEURONTIN) 300 MG capsule TAKE TWO CAPSULES BY MOUTH BY MOUTH EVERY MORNING and TAKE TWO CAPSULES BY MOUTH EVERYDAY AT BEDTIME  . Ginger, Zingiber officinalis, (GINGER ROOT) 550 MG CAPS Take 550 mg by mouth. 1 tablet twice a day  . isosorbide mononitrate (IMDUR) 30 MG 24 hr tablet Take 1 tablet (30 mg total) by mouth daily.  Javier Docker Oil 1000 MG CAPS Take 1,000 mg by mouth daily.  . magnesium oxide (MAG-OX) 400 MG tablet Take 400 mg by mouth daily.  . metoprolol succinate (TOPROL-XL) 50 MG 24 hr tablet Take 1 tablet (50 mg total) by mouth daily.  . Misc  Natural Products (FOCUSED MIND PO) Take 15 mg by mouth 1 day or 1 dose.  . nitroGLYCERIN (NITROSTAT) 0.4 MG SL tablet Place 1 tablet (0.4 mg total) under the tongue every 5 (five) minutes as needed for chest pain.  Marland Kitchen omega-3 acid ethyl esters (LOVAZA) 1 g capsule Take 1 g by mouth 2 (two) times daily.  . Omega-3 Fatty Acids (FISH OIL) 1000 MG CAPS Take 1 capsule by mouth 2 (two) times daily.   . promethazine (PHENERGAN) 12.5 MG tablet Take 1 tablet (12.5 mg total) by mouth every 6 (six) hours as needed for nausea or vomiting.  . Psyllium (DAILY FIBER PO) Take by mouth in the morning and at bedtime.  . saccharomyces boulardii (FLORASTOR) 250 MG capsule Take 250 mg by mouth 2 (two) times daily.  . TURMERIC PO Take 1,000 mg by mouth. One tablet po twice a day  . venlafaxine XR (EFFEXOR-XR) 37.5 MG 24 hr capsule TAKE ONE CAPSULE BY MOUTH EVERY MORNING  . Vitamin D, Ergocalciferol, (DRISDOL) 1.25 MG (50000 UNIT) CAPS capsule Take 50,000 Units by mouth every 7 (seven) days.  . vitamin E 1000 UNIT capsule Take 1,000 Units by mouth in the morning and at bedtime.   No facility-administered encounter medications on file as of 09/15/2020.    Current Diagnosis: Patient Active Problem List  Diagnosis Date Noted  . Acute hyperkalemia 09/13/2020  . Stage 3b chronic kidney disease ([place]) 09/13/2020  . Injury of right thigh 09/12/2020  . Contusion of left forearm 06/27/2020  . Unstable angina ([place])   . History of bacterial endocarditis 10/28/2018  . Episodic tension-type headache, not intractable 11/08/2014  . Other seasonal allergic rhinitis 07/08/2014  . Diabetic peripheral neuropathy ([place]) 06/29/2014  . Routine general medical examination at a health care facility 01/20/2014  . Other screening mammogram 01/20/2014  . Essential hypertension 08/03/2009  . CAD (coronary artery disease) 01/14/2008  . Controlled diabetes mellitus type 2 with complications ([place]) 54/00/8676  . Hyperlipidemia with target  LDL less than 100 06/13/2007  . Depression with anxiety 06/13/2007  . GASTROESOPHAGEAL REFLUX DISEASE 06/13/2007  . Osteoarthritis 06/13/2007    Goals Addressed   None       BP Readings from Last 3 Encounters:  09/12/20 126/80  09/12/20 128/60  07/11/20 140/72    Lab Results  Component Value Date   HGBA1C 7.2 (H) 09/12/2020     Patient obtains medications through Adherence Packaging  90 Days   Last adherence delivery included (date:08/30/2021): (medication name and frequency)  Dexilant 60mg   Ezetimibe 10mg   Clopidogrel 75mg   Atorvastatin 80mg   Olmesartan Medoxomil 40mg   Gabapentin 300mg   Dicyclomine 10mg   Venlafaxine 97.5 Er  Isosorbide Mononitrate ER 30mg   Metoprolol Succinate Er 50mg    Patient is due for next adherence delivery on: 12/04/2020. Called patient and reviewed medication needs.  Patient declined need for refills at this time. Patient is aware of how to contact pharmacist if refills are needed prior to next adherence delivery.   Wendy Poet, [place] [phone]

## 2020-09-29 ENCOUNTER — Telehealth: Payer: Self-pay | Admitting: Internal Medicine

## 2020-09-29 ENCOUNTER — Ambulatory Visit: Payer: PPO | Admitting: Internal Medicine

## 2020-09-29 NOTE — Telephone Encounter (Signed)
Patient was supposed to be seen today but had to cancel her appointment because she woke up with a headache and some congestion. Patient wondering since she was supposed to be coming in to do some blood work and an Copy about her potassium, does she need to continue not taking the medication that was stopped because of the potassium issues or what she needed to do.  [phone]

## 2020-09-30 ENCOUNTER — Telehealth (INDEPENDENT_AMBULATORY_CARE_PROVIDER_SITE_OTHER): Payer: PPO | Admitting: Family

## 2020-09-30 ENCOUNTER — Encounter: Payer: Self-pay | Admitting: Family

## 2020-09-30 ENCOUNTER — Other Ambulatory Visit: Payer: Self-pay

## 2020-09-30 DIAGNOSIS — J069 Acute upper respiratory infection, unspecified: Secondary | ICD-10-CM

## 2020-09-30 MED ORDER — BENZONATATE 100 MG PO CAPS: 100.0000 mg | ORAL_CAPSULE | Freq: Three times a day (TID) | ORAL | 0 refills | Status: DC | PRN

## 2020-09-30 NOTE — Progress Notes (Signed)
[name] [name] is a 80 y.o. female with the following history as recorded in EpicCare:  Patient Active Problem List   Diagnosis Date Noted  . Acute hyperkalemia 09/13/2020  . Stage 3b chronic kidney disease ([place]) 09/13/2020  . Injury of right thigh 09/12/2020  . Contusion of left forearm 06/27/2020  . Unstable angina ([place])   . History of bacterial endocarditis 10/28/2018  . Episodic tension-type headache, not intractable 11/08/2014  . Other seasonal allergic rhinitis 07/08/2014  . Diabetic peripheral neuropathy ([place]) 06/29/2014  . Routine general medical examination at a health care facility 01/20/2014  . Other screening mammogram 01/20/2014  . Essential hypertension 08/03/2009  . CAD (coronary artery disease) 01/14/2008  . Controlled diabetes mellitus type 2 with complications ([place]) 28/41/3244  . Hyperlipidemia with target LDL less than 100 06/13/2007  . Depression with anxiety 06/13/2007  . GASTROESOPHAGEAL REFLUX DISEASE 06/13/2007  . Osteoarthritis 06/13/2007    Current Outpatient Medications  Medication Sig Dispense Refill  . benzonatate (TESSALON) 100 MG capsule Take 1 capsule (100 mg total) by mouth 3 (three) times daily as needed. 20 capsule 0  . Ascorbic Acid (VITAMIN C) 500 MG CAPS Take 500 mg by mouth in the morning and at bedtime.    Marland Kitchen aspirin EC 81 MG tablet Take 81 mg by mouth every morning.     Marland Kitchen atorvastatin (LIPITOR) 80 MG tablet TAKE ONE TABLET BY MOUTH EVERYDAY AT BEDTIME 90 tablet 1  . celecoxib (CELEBREX) 50 MG capsule Take 1 capsule (50 mg total) by mouth 2 (two) times daily. 180 capsule 0  . Cholecalciferol (VITAMIN D3) 1000 UNITS CAPS Take 1,000 Units by mouth daily.    . Cinnamon 500 MG TABS Take 1,000 mg by mouth. Once a day    . clopidogrel (PLAVIX) 75 MG tablet TAKE ONE TABLET BY MOUTH EVERY MORNING 90 tablet 1  . DEXILANT 60 MG capsule TAKE ONE TABLET BY MOUTH EVERY MORNING 90 capsule 1  . dicyclomine (BENTYL) 10 MG capsule TAKE ONE TABLET BY MOUTH  FOUR TIMES DAILY 360 capsule 1  . ezetimibe (ZETIA) 10 MG tablet TAKE ONE TABLET BY MOUTH EVERY MORNING 90 tablet 1  . gabapentin (NEURONTIN) 300 MG capsule TAKE TWO CAPSULES BY MOUTH BY MOUTH EVERY MORNING and TAKE TWO CAPSULES BY MOUTH EVERYDAY AT BEDTIME 360 capsule 1  . Ginger, Zingiber officinalis, (GINGER ROOT) 550 MG CAPS Take 550 mg by mouth. 1 tablet twice a day    . isosorbide mononitrate (IMDUR) 30 MG 24 hr tablet Take 1 tablet (30 mg total) by mouth daily. 90 tablet 3  . Krill Oil 1000 MG CAPS Take 1,000 mg by mouth daily.    . magnesium oxide (MAG-OX) 400 MG tablet Take 400 mg by mouth daily.    . metoprolol succinate (TOPROL-XL) 50 MG 24 hr tablet Take 1 tablet (50 mg total) by mouth daily. 90 tablet 3  . Misc Natural Products (FOCUSED MIND PO) Take 15 mg by mouth 1 day or 1 dose.    . nitroGLYCERIN (NITROSTAT) 0.4 MG SL tablet Place 1 tablet (0.4 mg total) under the tongue every 5 (five) minutes as needed for chest pain. 25 tablet 2  . omega-3 acid ethyl esters (LOVAZA) 1 g capsule Take 1 g by mouth 2 (two) times daily.    . Omega-3 Fatty Acids (FISH OIL) 1000 MG CAPS Take 1 capsule by mouth 2 (two) times daily.     . promethazine (PHENERGAN) 12.5 MG tablet Take 1 tablet (12.5 mg  total) by mouth every 6 (six) hours as needed for nausea or vomiting. 15 tablet 0  . Psyllium (DAILY FIBER PO) Take by mouth in the morning and at bedtime.    . saccharomyces boulardii (FLORASTOR) 250 MG capsule Take 250 mg by mouth 2 (two) times daily.    . TURMERIC PO Take 1,000 mg by mouth. One tablet po twice a day    . venlafaxine XR (EFFEXOR-XR) 37.5 MG 24 hr capsule TAKE ONE CAPSULE BY MOUTH EVERY MORNING 90 capsule 1  . Vitamin D, Ergocalciferol, (DRISDOL) 1.25 MG (50000 UNIT) CAPS capsule Take 50,000 Units by mouth every 7 (seven) days.    . vitamin E 1000 UNIT capsule Take 1,000 Units by mouth in the morning and at bedtime.     No current facility-administered medications for this visit.     Allergies: Nitrofurantoin, Niacin and related, Ceftriaxone, Codeine, Prednisone, Sulfonamide derivatives, and Tetracycline  Past Medical History:  Diagnosis Date  . Allergy   . Anxiety   . CAD (coronary artery disease)    s/p inf MI 2007 - tx w/ DES to RCA // Echo 12/08: EF 60%, mild MR, mild LAE // LHC 08/2009: dLM 20%, LAD beyond diagonal 60-70%, stable from prior catheterization in 2007, ostial circumflex 40-50%, mid RCA stent ok, EF 65% // Myoview 08/2009: EF 76%, small inferoapical fixed defect, no ischemia // Myoview 7/13: no scar or ischemia, EF 69% //    . Cataract    removed both eyes  . Cerebrovascular disease, unspecified   . Chronic kidney disease    frequent Kidney Infections  . Chronic neck pain   . Chronic pain syndrome   . Diverticular disease   . Dizziness   . DJD (degenerative joint disease)   . DM type 2 (diabetes mellitus, type 2) ([place])   . Echocardiogram    Echo 10/2018: EF 55-60, normal RVSF, mod MAC, mod TR, severe AoV calcification and sclerosis with nodular calcium/mobile area of calcium in the LVOT (small veg vs Lambl's excrescence  - consider TEE), mild AI, mild AS (mean 11).   . Echocardiogram 04/2019   Echocardiogram 04/2019: EF 60-65, basal septal hypertrophy, grade 2 diastolic dysfunction, normal wall motion, normal RV SF, mild LAE, mod MAC, trivial MR, mod sclerosis of the aortic valve with mod aortic annular calcification, thin mobile filamentous structure on ventricular side of AV likely representing Lambl's excrescence, mild AI, mild TR  . GERD (gastroesophageal reflux disease)   . H/O hiatal hernia   . HTN (hypertension)   . IBS (irritable bowel syndrome)   . 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 Internal hemorrhoids   . Ischemic colitis ([place])   . Mitral valve prolapse   . Mixed hyperlipidemia   . Myocardial infarction ([place]) 2007  . Neuromuscular disorder ([place])    hiatal hernia  . Nocturia   . Pancreatitis    1955 an once more   . PONV (postoperative nausea and vomiting)    Difficluty opening mouth wide and turning head. (Cervical Fusion)  . Premature ventricular contractions   . Tubular adenoma of colon   . Ulcer    sam [place] [facility]  . Urinary incontinence     Past Surgical History:  Procedure Laterality Date  . APPENDECTOMY  1955  . BLADDER REPAIR  2007  . CARDIAC CATHETERIZATION  2007   Stents  . CERVICAL SPINE SURGERY  07/2005   Dr Consuello Masse  . CHOLECYSTECTOMY  2007  . COLONOSCOPY    .  CORONARY STENT INTERVENTION N/A 03/09/2020   Procedure: CORONARY STENT INTERVENTION;  Surgeon: Burnell Blanks, MD;  Location: [facility];  Service: Cardiovascular;  Laterality: N/A;  . DENTAL SURGERY  05/2013   replaced an inplant  . EYE SURGERY  2011   Bilteral  . I & D KNEE WITH POLY EXCHANGE  09/25/2011   Procedure: IRRIGATION AND DEBRIDEMENT KNEE WITH POLY EXCHANGE;  Surgeon: Johnny Bridge, MD;  Location: [facility];  Service: Orthopedics;  Laterality: Left;  . INCISION AND DRAINAGE ABSCESS / HEMATOMA OF BURSA / KNEE / THIGH  09/2011  . JOINT REPLACEMENT  2012   left  . LEFT HEART CATH AND CORONARY ANGIOGRAPHY N/A 03/09/2020   Procedure: LEFT HEART CATH AND CORONARY ANGIOGRAPHY;  Surgeon: Burnell Blanks, MD;  Location: [facility];  Service: Cardiovascular;  Laterality: N/A;  . left Total Knee Replacement  11/2010   Dr Percell Miller  . NECK SURGERY     has had 3 surgeries  . POLYPECTOMY    . POSTERIOR CERVICAL FUSION/FORAMINOTOMY  04/08/2012   Procedure: POSTERIOR CERVICAL FUSION/FORAMINOTOMY LEVEL 2;  Surgeon: Otilio Connors, MD;  Location: [facility];  Service: Neurosurgery;  Laterality: Left;  Left Cervical six-seven Foraminotomy, bilateral cervical seven-thoracic one foraminotomy, cervical six-seven, cervical seven-thoracic one fusion with posterior instrumentation  . TEMPOROMANDIBULAR JOINT SURGERY    . thumb surgery    . TONSILLECTOMY    . TONSILLECTOMY  1950  . TOTAL ABDOMINAL HYSTERECTOMY       Family History  Problem Relation Age of Onset  . Heart disease Mother   . Pancreatic cancer Mother   . Cancer - Other Brother        vocal cord cancer  . Colon polyps Sister   . Coronary artery disease Other        sibling  . Coronary artery disease Other        sibling  . Colon cancer Neg Hx   . Esophageal cancer Neg Hx   . Rectal cancer Neg Hx   . Stomach cancer Neg Hx     Social History   Tobacco Use  . Smoking status: Never Smoker  . Smokeless tobacco: Never Used  Substance Use Topics  . Alcohol use: No    Subjective:   I connected with [name] [name] on 09/30/20 at  1:20 PM EST by telephone call and verified that I am speaking with the correct person using two identifiers.   I discussed the limitations of evaluation and management by telemedicine and the availability of in person appointments. The patient expressed understanding and agreed to proceed. Provider in office/ patient is at home; provider and patient are only 2 people on telephone call.   3 day history of cough/ congestion; is suspicious that it could be COVID- her granddaughter and husband had it 2 weeks ago; she denies any chest pain or shortness of breath; notes she is actually feeling better today; difficult to get to our office for COVID testing- comfortable with assuming it is COVID and continuing to quarantine; interested in getting medicine to help with her cough;       Objective:  There were no vitals filed for this visit.  Lungs: Respirations unlabored;  Neurologic: Alert and oriented; speech intact; face symmetrical;   Assessment:  1. Viral URI with cough     Plan:  Difficulty with getting to office for COVID testing- will assume COVID and she will quarantine accordingly; symptoms are improved  today compared to Wednesday; Rx for Tessalon Perles 100 mg tid prn; increase fluids, rest and follow-up worse, no better.  Time spent 11 minutes  No follow-ups on file.  No orders of the defined  types were placed in this encounter.   Requested Prescriptions   Signed Prescriptions Disp Refills  . benzonatate (TESSALON) 100 MG capsule 20 capsule 0    Sig: Take 1 capsule (100 mg total) by mouth 3 (three) times daily as needed.

## 2020-10-04 DIAGNOSIS — Z9181 History of falling: Secondary | ICD-10-CM

## 2020-10-04 HISTORY — DX: History of falling: Z91.81

## 2020-10-10 ENCOUNTER — Other Ambulatory Visit: Payer: Self-pay

## 2020-10-10 ENCOUNTER — Encounter: Payer: Self-pay | Admitting: Internal Medicine

## 2020-10-10 ENCOUNTER — Ambulatory Visit (INDEPENDENT_AMBULATORY_CARE_PROVIDER_SITE_OTHER): Payer: PPO | Admitting: Internal Medicine

## 2020-10-10 VITALS — BP 138/82 | HR 81 | Temp 98.4°F | Resp 16 | Ht 60.0 in | Wt 127.0 lb

## 2020-10-10 DIAGNOSIS — E875 Hyperkalemia: Secondary | ICD-10-CM | POA: Insufficient documentation

## 2020-10-10 DIAGNOSIS — I10 Essential (primary) hypertension: Secondary | ICD-10-CM | POA: Diagnosis not present

## 2020-10-10 LAB — BASIC METABOLIC PANEL
BUN: 17 mg/dL (ref 6–23)
CO2: 35 mEq/L — ABNORMAL HIGH (ref 19–32)
Calcium: 9.4 mg/dL (ref 8.4–10.5)
Chloride: 100 mEq/L (ref 96–112)
Creatinine, Ser: 0.86 mg/dL (ref 0.40–1.20)
GFR: 63.57 mL/min (ref >60.00)
Glucose, Bld: 128 mg/dL — ABNORMAL HIGH (ref 70–99)
Potassium: 5.1 mEq/L (ref 3.5–5.1)
Sodium: 138 mEq/L (ref 135–145)

## 2020-10-10 NOTE — Progress Notes (Signed)
Subjective:  Patient ID: [name] [name], female    DOB: Apr 14, 1941  Age: 80 y.o. MRN: [id number]  CC: Hypertension  This visit occurred during the SARS-CoV-2 public health emergency.  Safety protocols were in place, including screening questions prior to the visit, additional usage of staff PPE, and extensive cleaning of exam room while observing appropriate contact time as indicated for disinfecting solutions.    HPI [name] [name] presents for f/up - She returns for follow-up after recently being seen and was found to have hyperkalemia.  She stopped taking the ARB.  She complains of fatigue and has had a few episodes of heart racing but she denies muscle weakness, muscle cramps, chest pain, shortness of breath, dizziness, or lightheadedness.  Outpatient Medications Prior to Visit  Medication Sig Dispense Refill  . Ascorbic Acid (VITAMIN C) 500 MG CAPS Take 500 mg by mouth in the morning and at bedtime.    Marland Kitchen aspirin EC 81 MG tablet Take 81 mg by mouth every morning.     Marland Kitchen atorvastatin (LIPITOR) 80 MG tablet TAKE ONE TABLET BY MOUTH EVERYDAY AT BEDTIME 90 tablet 1  . benzonatate (TESSALON) 100 MG capsule Take 1 capsule (100 mg total) by mouth 3 (three) times daily as needed. 20 capsule 0  . celecoxib (CELEBREX) 50 MG capsule Take 1 capsule (50 mg total) by mouth 2 (two) times daily. 180 capsule 0  . Cholecalciferol (VITAMIN D3) 1000 UNITS CAPS Take 1,000 Units by mouth daily.    . Cinnamon 500 MG TABS Take 1,000 mg by mouth. Once a day    . clopidogrel (PLAVIX) 75 MG tablet TAKE ONE TABLET BY MOUTH EVERY MORNING 90 tablet 1  . DEXILANT 60 MG capsule TAKE ONE TABLET BY MOUTH EVERY MORNING 90 capsule 1  . dicyclomine (BENTYL) 10 MG capsule TAKE ONE TABLET BY MOUTH FOUR TIMES DAILY 360 capsule 1  . ezetimibe (ZETIA) 10 MG tablet TAKE ONE TABLET BY MOUTH EVERY MORNING 90 tablet 1  . gabapentin (NEURONTIN) 300 MG capsule TAKE TWO CAPSULES BY MOUTH BY MOUTH EVERY MORNING and TAKE TWO CAPSULES  BY MOUTH EVERYDAY AT BEDTIME 360 capsule 1  . Ginger, Zingiber officinalis, (GINGER ROOT) 550 MG CAPS Take 550 mg by mouth. 1 tablet twice a day    . isosorbide mononitrate (IMDUR) 30 MG 24 hr tablet Take 1 tablet (30 mg total) by mouth daily. 90 tablet 3  . Krill Oil 1000 MG CAPS Take 1,000 mg by mouth daily.    . magnesium oxide (MAG-OX) 400 MG tablet Take 400 mg by mouth daily.    . metoprolol succinate (TOPROL-XL) 50 MG 24 hr tablet Take 1 tablet (50 mg total) by mouth daily. 90 tablet 3  . Misc Natural Products (FOCUSED MIND PO) Take 15 mg by mouth 1 day or 1 dose.    . nitroGLYCERIN (NITROSTAT) 0.4 MG SL tablet Place 1 tablet (0.4 mg total) under the tongue every 5 (five) minutes as needed for chest pain. 25 tablet 2  . omega-3 acid ethyl esters (LOVAZA) 1 g capsule Take 1 g by mouth 2 (two) times daily.    . Omega-3 Fatty Acids (FISH OIL) 1000 MG CAPS Take 1 capsule by mouth 2 (two) times daily.     . promethazine (PHENERGAN) 12.5 MG tablet Take 1 tablet (12.5 mg total) by mouth every 6 (six) hours as needed for nausea or vomiting. 15 tablet 0  . Psyllium (DAILY FIBER PO) Take by mouth in the morning and  at bedtime.    . saccharomyces boulardii (FLORASTOR) 250 MG capsule Take 250 mg by mouth 2 (two) times daily.    . TURMERIC PO Take 1,000 mg by mouth. One tablet po twice a day    . venlafaxine XR (EFFEXOR-XR) 37.5 MG 24 hr capsule TAKE ONE CAPSULE BY MOUTH EVERY MORNING 90 capsule 1  . Vitamin D, Ergocalciferol, (DRISDOL) 1.25 MG (50000 UNIT) CAPS capsule Take 50,000 Units by mouth every 7 (seven) days.    . vitamin E 1000 UNIT capsule Take 1,000 Units by mouth in the morning and at bedtime.     No facility-administered medications prior to visit.    ROS Review of Systems  Constitutional: Positive for fatigue. Negative for appetite change and diaphoresis.  HENT: Negative.   Eyes: Negative.   Respiratory: Negative for chest tightness, shortness of breath and wheezing.    Cardiovascular: Negative for chest pain, palpitations and leg swelling.  Endocrine: Negative.   Genitourinary: Negative.  Negative for difficulty urinating, dysuria and hematuria.  Musculoskeletal: Negative for back pain.  Skin: Negative.  Negative for color change and pallor.  Neurological: Negative.  Negative for dizziness, weakness, light-headedness and headaches.  Hematological: Negative for adenopathy. Does not bruise/bleed easily.  Psychiatric/Behavioral: Negative.     Objective:  BP 138/82   Pulse 81   Temp 98.4 F (36.9 C) (Oral)   Resp 16   Ht 5' (1.524 m)   Wt 127 lb (57.6 kg)   SpO2 98%   BMI 24.80 kg/m   BP Readings from Last 3 Encounters:  10/10/20 138/82  09/12/20 126/80  09/12/20 128/60    Wt Readings from Last 3 Encounters:  10/10/20 127 lb (57.6 kg)  09/12/20 127 lb (57.6 kg)  09/12/20 127 lb 6.4 oz (57.8 kg)    Physical Exam Vitals reviewed.  Constitutional:      Appearance: Normal appearance.  HENT:     Nose: Nose normal.     Mouth/Throat:     Mouth: Mucous membranes are moist.  Eyes:     General: No scleral icterus.    Conjunctiva/sclera: Conjunctivae normal.  Cardiovascular:     Rate and Rhythm: Normal rate and regular rhythm.     Heart sounds: No murmur heard.     Comments: EKG- NSR, 71 bpm Normal EKG Pulmonary:     Effort: Pulmonary effort is normal.     Breath sounds: No stridor. No wheezing, rhonchi or rales.  Abdominal:     General: Abdomen is flat. Bowel sounds are normal. There is no distension.     Palpations: Abdomen is soft. There is no hepatomegaly.     Tenderness: There is no abdominal tenderness.  Musculoskeletal:        General: Normal range of motion.     Cervical back: Neck supple.     Right lower leg: No edema.     Left lower leg: No edema.  Lymphadenopathy:     Cervical: No cervical adenopathy.  Skin:    General: Skin is warm and dry.  Neurological:     General: No focal deficit present.     Mental Status:  She is alert.  Psychiatric:        Mood and Affect: Mood normal.        Behavior: Behavior normal.     Lab Results  Component Value Date   WBC 7.4 09/12/2020   HGB 13.3 09/12/2020   HCT 40.3 09/12/2020   PLT 244.0 09/12/2020   GLUCOSE 128 (H)  10/10/2020   CHOL 112 09/12/2020   TRIG 244.0 (H) 09/12/2020   HDL 48.80 09/12/2020   LDLDIRECT 42.0 09/12/2020   LDLCALC 26 04/30/2019   ALT 19 09/12/2020   AST 20 09/12/2020   NA 138 10/10/2020   K 5.1 10/10/2020   CL 100 10/10/2020   CREATININE 0.86 10/10/2020   BUN 17 10/10/2020   CO2 35 (H) 10/10/2020   TSH 1.54 09/12/2020   INR 1.09 01/23/2018   HGBA1C 7.2 (H) 09/12/2020   MICROALBUR 1.9 09/12/2020    No results found.  Assessment & Plan:   [name] [name] was seen today for hypertension.  Diagnoses and all orders for this visit:  Chronic hyperkalemia- Her potassium level is in the high normal range now at 5.1.  EKG is negative for any changes consistent with hyperkalemia.  She will remain off of the ARB. -     Basic metabolic panel; Future -     Basic metabolic panel  Essential hypertension- Her blood pressure is adequately well controlled. -     Basic metabolic panel; Future -     EKG 12-Lead -     Basic metabolic panel   I am having [name] F. Javier "FAYE" maintain her Fish Oil, Vitamin D3, Vitamin C, aspirin EC, metoprolol succinate, Krill Oil, nitroGLYCERIN, isosorbide mononitrate, promethazine, venlafaxine XR, dicyclomine, clopidogrel, ezetimibe, Dexilant, atorvastatin, gabapentin, Cinnamon, Ginger Root, TURMERIC PO, magnesium oxide, vitamin E, saccharomyces boulardii, Psyllium (DAILY FIBER PO), omega-3 acid ethyl esters, Vitamin D (Ergocalciferol), Misc Natural Products (FOCUSED MIND PO), celecoxib, and benzonatate.  No orders of the defined types were placed in this encounter.    Follow-up: Return in about 6 months (around 04/09/2021).  [name], MD

## 2020-10-10 NOTE — Patient Instructions (Signed)
Hyperkalemia Hyperkalemia occurs when the level of potassium in your blood is too high. Potassium is an important nutrient that helps the muscles and nerves function normally. It affects how the heart works, and it helps keep fluids and minerals balanced in the body. If there is too much potassium in your blood, it can affect your heart's ability to function normally. Potassium is normally removed (excreted) from the body by the kidneys. Hyperkalemia can result from various conditions. It can range from mild to severe. What are the causes? This condition may be caused by:  Taking in too much potassium. You can do this by: ? Using salt substitutes. They contain large amounts of potassium. ? Taking potassium supplements. ? Eating foods that are high in potassium.  Excreting too little potassium. This can happen if: ? Your kidneys are not working properly. Kidney (renal) disease, including short-term or long-term renal failure, is a common cause of hyperkalemia. ? You are taking medicines that lower your excretion of potassium. ? You have Addison's disease. ? You have a urinary tract blockage, such as kidney stones. ? You are on treatment to mechanically clean your blood (dialysis) and you skip a treatment.  Releasing a high amount of potassium from your cells into your blood. This can happen with: ? Injury to muscles (rhabdomyolysis) or other tissues. Most potassium is stored in your muscles. ? Severe burns or infections. ? Acidic blood plasma (acidosis). Acidosis can result from many diseases, such as uncontrolled diabetes. What increases the risk? The following factors may make you more likely to develop this condition:  Kidney disease. This puts you at the highest risk.  Addison's disease. This is a condition where the adrenal glands do not produce enough hormones.  Alcoholism or heavy drug use.  Using certain blood pressure medicines, such as ACE inhibitors, angiotensin II receptor  blockers (ARBs), or potassium-sparing diuretics such as spironolactone.  Severe injury or burn. What are the signs or symptoms? In many cases, there are no symptoms. However, when your potassium level becomes high enough, you may have symptoms such as:  An irregular or very slow heartbeat.  Nausea.  Tiredness (fatigue).  Confusion.  Tingling of your skin or numbness of your hands or feet.  Muscle cramps.  Muscle weakness.  Not being able to move (paralysis). How is this diagnosed? This condition may be diagnosed based on:  Your symptoms and medical history. Your health care provider will ask about your use of prescription and non-prescription drugs.  A physical exam.  Blood tests.  An electrocardiogram (ECG). How is this treated? Treatment depends on the cause and severity of your condition. Treatment may need to be done in the hospital setting. Treatment may include:  IV glucose (sugar) along with insulin to shift potassium out of your blood and into your cells.  A medicine called albuterol to shift potassium out of your blood and into your cells.  Medicines to remove the potassium from your body.  Dialysis to remove the potassium from your body.  Calcium to protect your heart from the effects of high potassium, such as irregular rhythms (arrhythmias). Follow these instructions at home:  Take over-the-counter and prescription medicines only as told by your health care provider.  Do not take any supplements, natural products, herbs, or vitamins without reviewing them with your health care provider. Certain supplements and natural food products contain high amounts of potassium.  Limit your alcohol intake as told by your health care provider.  Do not use drugs.   If you need help quitting, ask your health care provider.  If you have kidney disease, you may need to follow a low-potassium diet. A dietitian can help you learn which foods have high or low amounts of  potassium.  Keep all follow-up visits as told by your health care provider. This is important.   Contact a health care provider if you:  Have an irregular or very slow heartbeat.  Feel light-headed.  Feel weak.  Are nauseous.  Have tingling or numbness in your hands or feet. Get help right away if you:  Have shortness of breath.  Have chest pain or discomfort.  Pass out.  Have muscle paralysis. Summary  Hyperkalemia occurs when the level of potassium in your blood is too high.  This condition may be caused by taking in too much potassium, excreting too little potassium, or releasing a high amount of potassium from your cells into your blood.  Hyperkalemia can result from many underlying conditions, especially chronic kidney disease, or from taking certain medicines.  Treatment of hyperkalemia may include medicine to shift potassium out of your blood and into your cells or to remove the potassium from your body.  If you have kidney disease, you may need to follow a low-potassium diet. A dietitian can help you learn which foods have high or low amounts of potassium. This information is not intended to replace advice given to you by your health care provider. Make sure you discuss any questions you have with your health care provider. Document Revised: 01/21/2020 Document Reviewed: 01/21/2020 Elsevier Patient Education  2021 Elsevier Inc.  

## 2020-10-19 ENCOUNTER — Telehealth: Payer: Self-pay | Admitting: Pharmacist

## 2020-10-19 NOTE — Progress Notes (Unsigned)
Chronic Care Management Pharmacy Assistant   Name: [name] [name]  MRN: [id number] DOB: 1941/03/16  Reason for Encounter: Medication Review  PCP : Janith Lima, MD  Allergies:   Allergies  Allergen Reactions  . Nitrofurantoin Nausea Only    Severe nausea  . Niacin And Related Other (See Comments)    Must take "Flush-free"  . Ceftriaxone Itching  . Codeine Nausea Only  . Prednisone Itching and Rash  . Sulfonamide Derivatives Itching  . Tetracycline Itching and Rash    Medications: Outpatient Encounter Medications as of 10/19/2020  Medication Sig  . Ascorbic Acid (VITAMIN C) 500 MG CAPS Take 500 mg by mouth in the morning and at bedtime.  Marland Kitchen aspirin EC 81 MG tablet Take 81 mg by mouth every morning.   Marland Kitchen atorvastatin (LIPITOR) 80 MG tablet TAKE ONE TABLET BY MOUTH EVERYDAY AT BEDTIME  . benzonatate (TESSALON) 100 MG capsule Take 1 capsule (100 mg total) by mouth 3 (three) times daily as needed.  . celecoxib (CELEBREX) 50 MG capsule Take 1 capsule (50 mg total) by mouth 2 (two) times daily.  . Cholecalciferol (VITAMIN D3) 1000 UNITS CAPS Take 1,000 Units by mouth daily.  . Cinnamon 500 MG TABS Take 1,000 mg by mouth. Once a day  . clopidogrel (PLAVIX) 75 MG tablet TAKE ONE TABLET BY MOUTH EVERY MORNING  . DEXILANT 60 MG capsule TAKE ONE TABLET BY MOUTH EVERY MORNING  . dicyclomine (BENTYL) 10 MG capsule TAKE ONE TABLET BY MOUTH FOUR TIMES DAILY  . ezetimibe (ZETIA) 10 MG tablet TAKE ONE TABLET BY MOUTH EVERY MORNING  . gabapentin (NEURONTIN) 300 MG capsule TAKE TWO CAPSULES BY MOUTH BY MOUTH EVERY MORNING and TAKE TWO CAPSULES BY MOUTH EVERYDAY AT BEDTIME  . Ginger, Zingiber officinalis, (GINGER ROOT) 550 MG CAPS Take 550 mg by mouth. 1 tablet twice a day  . isosorbide mononitrate (IMDUR) 30 MG 24 hr tablet Take 1 tablet (30 mg total) by mouth daily.  Javier Docker Oil 1000 MG CAPS Take 1,000 mg by mouth daily.  . magnesium oxide (MAG-OX) 400 MG tablet Take 400 mg by mouth daily.   . metoprolol succinate (TOPROL-XL) 50 MG 24 hr tablet Take 1 tablet (50 mg total) by mouth daily.  . Misc Natural Products (FOCUSED MIND PO) Take 15 mg by mouth 1 day or 1 dose.  . nitroGLYCERIN (NITROSTAT) 0.4 MG SL tablet Place 1 tablet (0.4 mg total) under the tongue every 5 (five) minutes as needed for chest pain.  Marland Kitchen omega-3 acid ethyl esters (LOVAZA) 1 g capsule Take 1 g by mouth 2 (two) times daily.  . Omega-3 Fatty Acids (FISH OIL) 1000 MG CAPS Take 1 capsule by mouth 2 (two) times daily.   . promethazine (PHENERGAN) 12.5 MG tablet Take 1 tablet (12.5 mg total) by mouth every 6 (six) hours as needed for nausea or vomiting.  . Psyllium (DAILY FIBER PO) Take by mouth in the morning and at bedtime.  . saccharomyces boulardii (FLORASTOR) 250 MG capsule Take 250 mg by mouth 2 (two) times daily.  . TURMERIC PO Take 1,000 mg by mouth. One tablet po twice a day  . venlafaxine XR (EFFEXOR-XR) 37.5 MG 24 hr capsule TAKE ONE CAPSULE BY MOUTH EVERY MORNING  . Vitamin D, Ergocalciferol, (DRISDOL) 1.25 MG (50000 UNIT) CAPS capsule Take 50,000 Units by mouth every 7 (seven) days.  . vitamin E 1000 UNIT capsule Take 1,000 Units by mouth in the morning and at bedtime.  No facility-administered encounter medications on file as of 10/19/2020.    Current Diagnosis: Patient Active Problem List   Diagnosis Date Noted  . Chronic hyperkalemia 10/10/2020  . Acute hyperkalemia 09/13/2020  . Stage 3b chronic kidney disease ([place]) 09/13/2020  . Unstable angina ([place])   . History of bacterial endocarditis 10/28/2018  . Episodic tension-type headache, not intractable 11/08/2014  . Other seasonal allergic rhinitis 07/08/2014  . Diabetic peripheral neuropathy ([place]) 06/29/2014  . Routine general medical examination at a health care facility 01/20/2014  . Other screening mammogram 01/20/2014  . Essential hypertension 08/03/2009  . CAD (coronary artery disease) 01/14/2008  . Controlled diabetes mellitus type 2  with complications ([place]) 28/00/3491  . Hyperlipidemia with target LDL less than 100 06/13/2007  . Depression with anxiety 06/13/2007  . GASTROESOPHAGEAL REFLUX DISEASE 06/13/2007  . Osteoarthritis 06/13/2007   Reviewed chart for medication changes ahead of medication coordination call.  No OVs, Consults, or hospital visits since last care coordination call/Pharmacist visit. (If appropriate, list visit date, provider name)  No medication changes indicated OR if recent visit, treatment plan here.  BP Readings from Last 3 Encounters:  10/10/20 138/82  09/12/20 126/80  09/12/20 128/60    Lab Results  Component Value Date   HGBA1C 7.2 (H) 09/12/2020     Patient obtains medications through Adherence Packaging  90 Days   Last adherence delivery included: (medication name and frequency)    Patient is due for next adherence delivery on: ***. Called patient and reviewed medications and coordinated delivery.  Georgiana Shore ,[place] Pharmacist Assistant [phone]  Follow-Up:  {[facility] Follow-up:[id number]}

## 2020-10-24 ENCOUNTER — Telehealth: Payer: Self-pay | Admitting: Pharmacist

## 2020-10-24 NOTE — Progress Notes (Signed)
Chronic Care Management Pharmacy Assistant   Name: [name] [name]  MRN: [id number] DOB: [birth year]  Reason for Encounter: Telephone Call   PCP : Janith Lima, MD  Allergies:   Allergies  Allergen Reactions  . Nitrofurantoin Nausea Only    Severe nausea  . Niacin And Related Other (See Comments)    Must take "Flush-free"  . Ceftriaxone Itching  . Codeine Nausea Only  . Prednisone Itching and Rash  . Sulfonamide Derivatives Itching  . Tetracycline Itching and Rash    Medications: Outpatient Encounter Medications as of 10/24/2020  Medication Sig  . Ascorbic Acid (VITAMIN C) 500 MG CAPS Take 500 mg by mouth in the morning and at bedtime.  Marland Kitchen aspirin EC 81 MG tablet Take 81 mg by mouth every morning.   Marland Kitchen atorvastatin (LIPITOR) 80 MG tablet TAKE ONE TABLET BY MOUTH EVERYDAY AT BEDTIME  . benzonatate (TESSALON) 100 MG capsule Take 1 capsule (100 mg total) by mouth 3 (three) times daily as needed.  . celecoxib (CELEBREX) 50 MG capsule Take 1 capsule (50 mg total) by mouth 2 (two) times daily.  . Cholecalciferol (VITAMIN D3) 1000 UNITS CAPS Take 1,000 Units by mouth daily.  . Cinnamon 500 MG TABS Take 1,000 mg by mouth. Once a day  . clopidogrel (PLAVIX) 75 MG tablet TAKE ONE TABLET BY MOUTH EVERY MORNING  . DEXILANT 60 MG capsule TAKE ONE TABLET BY MOUTH EVERY MORNING  . dicyclomine (BENTYL) 10 MG capsule TAKE ONE TABLET BY MOUTH FOUR TIMES DAILY  . ezetimibe (ZETIA) 10 MG tablet TAKE ONE TABLET BY MOUTH EVERY MORNING  . gabapentin (NEURONTIN) 300 MG capsule TAKE TWO CAPSULES BY MOUTH BY MOUTH EVERY MORNING and TAKE TWO CAPSULES BY MOUTH EVERYDAY AT BEDTIME  . Ginger, Zingiber officinalis, (GINGER ROOT) 550 MG CAPS Take 550 mg by mouth. 1 tablet twice a day  . isosorbide mononitrate (IMDUR) 30 MG 24 hr tablet Take 1 tablet (30 mg total) by mouth daily.  Javier Docker Oil 1000 MG CAPS Take 1,000 mg by mouth daily.  . magnesium oxide (MAG-OX) 400 MG tablet Take 400 mg by mouth daily.   . metoprolol succinate (TOPROL-XL) 50 MG 24 hr tablet Take 1 tablet (50 mg total) by mouth daily.  . Misc Natural Products (FOCUSED MIND PO) Take 15 mg by mouth 1 day or 1 dose.  . nitroGLYCERIN (NITROSTAT) 0.4 MG SL tablet Place 1 tablet (0.4 mg total) under the tongue every 5 (five) minutes as needed for chest pain.  Marland Kitchen omega-3 acid ethyl esters (LOVAZA) 1 g capsule Take 1 g by mouth 2 (two) times daily.  . Omega-3 Fatty Acids (FISH OIL) 1000 MG CAPS Take 1 capsule by mouth 2 (two) times daily.   . promethazine (PHENERGAN) 12.5 MG tablet Take 1 tablet (12.5 mg total) by mouth every 6 (six) hours as needed for nausea or vomiting.  . Psyllium (DAILY FIBER PO) Take by mouth in the morning and at bedtime.  . saccharomyces boulardii (FLORASTOR) 250 MG capsule Take 250 mg by mouth 2 (two) times daily.  . TURMERIC PO Take 1,000 mg by mouth. One tablet po twice a day  . venlafaxine XR (EFFEXOR-XR) 37.5 MG 24 hr capsule TAKE ONE CAPSULE BY MOUTH EVERY MORNING  . Vitamin D, Ergocalciferol, (DRISDOL) 1.25 MG (50000 UNIT) CAPS capsule Take 50,000 Units by mouth every 7 (seven) days.  . vitamin E 1000 UNIT capsule Take 1,000 Units by mouth in the morning and at bedtime.  No facility-administered encounter medications on file as of 10/24/2020.    Current Diagnosis: Patient Active Problem List   Diagnosis Date Noted  . Chronic hyperkalemia 10/10/2020  . Acute hyperkalemia 09/13/2020  . Stage 3b chronic kidney disease ([place]) 09/13/2020  . Unstable angina ([place])   . History of bacterial endocarditis 10/28/2018  . Episodic tension-type headache, not intractable 11/08/2014  . Other seasonal allergic rhinitis 07/08/2014  . Diabetic peripheral neuropathy ([place]) 06/29/2014  . Routine general medical examination at a health care facility 01/20/2014  . Other screening mammogram 01/20/2014  . Essential hypertension 08/03/2009  . CAD (coronary artery disease) 01/14/2008  . Controlled diabetes mellitus type 2  with complications ([place]) 79/15/0569  . Hyperlipidemia with target LDL less than 100 06/13/2007  . Depression with anxiety 06/13/2007  . GASTROESOPHAGEAL REFLUX DISEASE 06/13/2007  . Osteoarthritis 06/13/2007    Goals Addressed   None     Follow-Up:  Pharmacist Review   The patient called in to ask if she was suppose to be taking Vitamin D. She said that she was looking on my chart and saw where her med list had vitamin d listed. The patient states that she has never taken vitamin D and wanted to know if she should be taking or not. The patient then called back after talking to the nurse at Dr. Ronnald Ramp office that she was to get it otc. The patient stated that she did not know that and that she will start taking as soon as she can.   Wendy Poet, [place] [facility] [phone]   Time spent :20

## 2020-10-25 ENCOUNTER — Telehealth: Payer: Self-pay | Admitting: Internal Medicine

## 2020-10-25 ENCOUNTER — Telehealth: Payer: Self-pay | Admitting: Pharmacist

## 2020-10-25 ENCOUNTER — Other Ambulatory Visit: Payer: Self-pay | Admitting: Internal Medicine

## 2020-10-25 DIAGNOSIS — E559 Vitamin D deficiency, unspecified: Secondary | ICD-10-CM

## 2020-10-25 MED ORDER — VITAMIN D (ERGOCALCIFEROL) 1.25 MG (50000 UNIT) PO CAPS: 50000.0000 [IU] | ORAL_CAPSULE | ORAL | 0 refills | Status: DC

## 2020-10-25 NOTE — Telephone Encounter (Signed)
Vitamin D, Ergocalciferol, (DRISDOL) 1.25 MG (50000 UNIT) CAPS capsule [facility] - [place], [place] - [address] Phone:  [phone]  Fax:  [phone]     Patient requesting a refill

## 2020-10-25 NOTE — Progress Notes (Signed)
Chronic Care Management Pharmacy Assistant   Name: [name] [name]  MRN: [id number] DOB: [birth year]  Reason for Encounter: Medication Review   PCP : Janith Lima, MD  Allergies:   Allergies  Allergen Reactions  . Nitrofurantoin Nausea Only    Severe nausea  . Niacin And Related Other (See Comments)    Must take "Flush-free"  . Ceftriaxone Itching  . Codeine Nausea Only  . Prednisone Itching and Rash  . Sulfonamide Derivatives Itching  . Tetracycline Itching and Rash    Medications: Outpatient Encounter Medications as of 10/25/2020  Medication Sig  . Ascorbic Acid (VITAMIN C) 500 MG CAPS Take 500 mg by mouth in the morning and at bedtime.  Marland Kitchen aspirin EC 81 MG tablet Take 81 mg by mouth every morning.   Marland Kitchen atorvastatin (LIPITOR) 80 MG tablet TAKE ONE TABLET BY MOUTH EVERYDAY AT BEDTIME  . benzonatate (TESSALON) 100 MG capsule Take 1 capsule (100 mg total) by mouth 3 (three) times daily as needed.  . celecoxib (CELEBREX) 50 MG capsule Take 1 capsule (50 mg total) by mouth 2 (two) times daily.  . Cholecalciferol (VITAMIN D3) 1000 UNITS CAPS Take 1,000 Units by mouth daily.  . Cinnamon 500 MG TABS Take 1,000 mg by mouth. Once a day  . clopidogrel (PLAVIX) 75 MG tablet TAKE ONE TABLET BY MOUTH EVERY MORNING  . DEXILANT 60 MG capsule TAKE ONE TABLET BY MOUTH EVERY MORNING  . dicyclomine (BENTYL) 10 MG capsule TAKE ONE TABLET BY MOUTH FOUR TIMES DAILY  . ezetimibe (ZETIA) 10 MG tablet TAKE ONE TABLET BY MOUTH EVERY MORNING  . gabapentin (NEURONTIN) 300 MG capsule TAKE TWO CAPSULES BY MOUTH BY MOUTH EVERY MORNING and TAKE TWO CAPSULES BY MOUTH EVERYDAY AT BEDTIME  . Ginger, Zingiber officinalis, (GINGER ROOT) 550 MG CAPS Take 550 mg by mouth. 1 tablet twice a day  . isosorbide mononitrate (IMDUR) 30 MG 24 hr tablet Take 1 tablet (30 mg total) by mouth daily.  Javier Docker Oil 1000 MG CAPS Take 1,000 mg by mouth daily.  . magnesium oxide (MAG-OX) 400 MG tablet Take 400 mg by mouth  daily.  . metoprolol succinate (TOPROL-XL) 50 MG 24 hr tablet Take 1 tablet (50 mg total) by mouth daily.  . Misc Natural Products (FOCUSED MIND PO) Take 15 mg by mouth 1 day or 1 dose.  . nitroGLYCERIN (NITROSTAT) 0.4 MG SL tablet Place 1 tablet (0.4 mg total) under the tongue every 5 (five) minutes as needed for chest pain.  Marland Kitchen omega-3 acid ethyl esters (LOVAZA) 1 g capsule Take 1 g by mouth 2 (two) times daily.  . Omega-3 Fatty Acids (FISH OIL) 1000 MG CAPS Take 1 capsule by mouth 2 (two) times daily.   . promethazine (PHENERGAN) 12.5 MG tablet Take 1 tablet (12.5 mg total) by mouth every 6 (six) hours as needed for nausea or vomiting.  . Psyllium (DAILY FIBER PO) Take by mouth in the morning and at bedtime.  . saccharomyces boulardii (FLORASTOR) 250 MG capsule Take 250 mg by mouth 2 (two) times daily.  . TURMERIC PO Take 1,000 mg by mouth. One tablet po twice a day  . venlafaxine XR (EFFEXOR-XR) 37.5 MG 24 hr capsule TAKE ONE CAPSULE BY MOUTH EVERY MORNING  . Vitamin D, Ergocalciferol, (DRISDOL) 1.25 MG (50000 UNIT) CAPS capsule Take 50,000 Units by mouth every 7 (seven) days.  . vitamin E 1000 UNIT capsule Take 1,000 Units by mouth in the morning and at bedtime.  No facility-administered encounter medications on file as of 10/25/2020.    Current Diagnosis: Patient Active Problem List   Diagnosis Date Noted  . Chronic hyperkalemia 10/10/2020  . Acute hyperkalemia 09/13/2020  . Stage 3b chronic kidney disease ([place]) 09/13/2020  . Unstable angina ([place])   . History of bacterial endocarditis 10/28/2018  . Episodic tension-type headache, not intractable 11/08/2014  . Other seasonal allergic rhinitis 07/08/2014  . Diabetic peripheral neuropathy ([place]) 06/29/2014  . Routine general medical examination at a health care facility 01/20/2014  . Other screening mammogram 01/20/2014  . Essential hypertension 08/03/2009  . CAD (coronary artery disease) 01/14/2008  . Controlled diabetes mellitus  type 2 with complications ([place]) 94/17/4081  . Hyperlipidemia with target LDL less than 100 06/13/2007  . Depression with anxiety 06/13/2007  . GASTROESOPHAGEAL REFLUX DISEASE 06/13/2007  . Osteoarthritis 06/13/2007    Goals Addressed   None       BP Readings from Last 3 Encounters:  10/10/20 138/82  09/12/20 126/80  09/12/20 128/60    Lab Results  Component Value Date   HGBA1C 7.2 (H) 09/12/2020     Patient obtains medications through Adherence Packaging  90 Days   Last adherence delivery included (date:08/30/20): (medication name and frequency) Dexilant 60mg   Ezetimibe 10mg   Clopidogrel 75mg   Atorvastatin 80mg   Olmesartan Medoxomil 40mg   Gabapentin 300mg   Dicyclomine 10mg   Venlafaxine 97.5 Er  Isosorbide Mononitrate ER 30mg   Metoprolol Succinate Er 50mg     Patient is due for next adherence delivery on: 12/29/20. Called patient and reviewed medication needs.  Patient declined need for refills at this time. Patient is aware of how to contact pharmacist if refills are needed prior to next adherence delivery.   Wendy Poet, Clinical Pharmacist Assistant [facility] [phone]   Time Spent:25

## 2020-11-23 ENCOUNTER — Telehealth: Payer: Self-pay | Admitting: Pharmacist

## 2020-11-23 NOTE — Progress Notes (Signed)
Chronic Care Management Pharmacy Assistant   Name: [name] [name]  MRN: [id number] DOB: 29-May-1941   Reason for Encounter: Medication Coordination Call    Recent office visits:  10/10/20 Dr. Ronnald Ramp  Recent consult visits:  None ID  Hospital visits:  None in previous 6 months  Medications: Outpatient Encounter Medications as of 11/23/2020  Medication Sig  . Ascorbic Acid (VITAMIN C) 500 MG CAPS Take 500 mg by mouth in the morning and at bedtime.  Marland Kitchen aspirin EC 81 MG tablet Take 81 mg by mouth every morning.   Marland Kitchen atorvastatin (LIPITOR) 80 MG tablet TAKE ONE TABLET BY MOUTH EVERYDAY AT BEDTIME  . celecoxib (CELEBREX) 50 MG capsule Take 1 capsule (50 mg total) by mouth 2 (two) times daily.  . Cinnamon 500 MG TABS Take 1,000 mg by mouth. Once a day  . clopidogrel (PLAVIX) 75 MG tablet TAKE ONE TABLET BY MOUTH EVERY MORNING  . DEXILANT 60 MG capsule TAKE ONE TABLET BY MOUTH EVERY MORNING  . dicyclomine (BENTYL) 10 MG capsule TAKE ONE TABLET BY MOUTH FOUR TIMES DAILY  . ezetimibe (ZETIA) 10 MG tablet TAKE ONE TABLET BY MOUTH EVERY MORNING  . gabapentin (NEURONTIN) 300 MG capsule TAKE TWO CAPSULES BY MOUTH BY MOUTH EVERY MORNING and TAKE TWO CAPSULES BY MOUTH EVERYDAY AT BEDTIME  . isosorbide mononitrate (IMDUR) 30 MG 24 hr tablet Take 1 tablet (30 mg total) by mouth daily.  Javier Docker Oil 1000 MG CAPS Take 1,000 mg by mouth daily.  . magnesium oxide (MAG-OX) 400 MG tablet Take 400 mg by mouth daily.  . metoprolol succinate (TOPROL-XL) 50 MG 24 hr tablet Take 1 tablet (50 mg total) by mouth daily.  . Misc Natural Products (FOCUSED MIND PO) Take 15 mg by mouth 1 day or 1 dose.  . nitroGLYCERIN (NITROSTAT) 0.4 MG SL tablet Place 1 tablet (0.4 mg total) under the tongue every 5 (five) minutes as needed for chest pain.  Marland Kitchen omega-3 acid ethyl esters (LOVAZA) 1 g capsule Take 1 g by mouth 2 (two) times daily.  . Psyllium (DAILY FIBER PO) Take by mouth in the morning and at bedtime.  .  saccharomyces boulardii (FLORASTOR) 250 MG capsule Take 250 mg by mouth 2 (two) times daily.  . TURMERIC PO Take 1,000 mg by mouth. One tablet po twice a day  . venlafaxine XR (EFFEXOR-XR) 37.5 MG 24 hr capsule TAKE ONE CAPSULE BY MOUTH EVERY MORNING  . Vitamin D, Ergocalciferol, (DRISDOL) 1.25 MG (50000 UNIT) CAPS capsule Take 1 capsule (50,000 Units total) by mouth every 7 (seven) days.  . vitamin E 1000 UNIT capsule Take 1,000 Units by mouth in the morning and at bedtime.   No facility-administered encounter medications on file as of 11/23/2020.    Reviewed chart for medication changes ahead of medication coordination call.  No OVs, Consults, or hospital visits since last care coordination call/Pharmacist visit. No medication changes indicated   BP Readings from Last 3 Encounters:  10/10/20 138/82  09/12/20 126/80  09/12/20 128/60    Lab Results  Component Value Date   HGBA1C 7.2 (H) 09/12/2020    Medication Review:  Patient obtains medications through Adherence Packaging  90 Days   Last adherence delivery included:  Dexilant 60mg  take 1 tab by mouth at breakfast Ezetimibe 10mg  take 1 tab by mouth at breakfast Clopidogrel 75mg  take 1 tab by mouth at breakfast Atorvastatin 80mg  take 1 tab at bedtime  Gabapentin 300mg  take 2 caps by mouth at  breakfast and 2 caps at bedtime Dicyclomine 10mg  take 1 cap by mouth 4 times daily Venlafaxine 97.5 Er take 1 cap by mouth at breakfast Isosorbide Mononitrate ER 30mg  take 1 tab by mouth at breakfast Metoprolol Succinate Er 50mg  take 1 tab by mouth at breakfast Vitamin D 50000 units take 1 cap by mouth every 7 days   Patient is due for next adherence delivery on: 12/05/20. Called patient and reviewed medications and coordinated delivery.  This delivery to include: Dexilant 60mg  take 1 tab by mouth at breakfast Ezetimibe 10mg  take 1 tab by mouth at breakfast Clopidogrel 75mg  take 1 tab by mouth at breakfast Atorvastatin 80mg  take 1 tab  at bedtime  Gabapentin 300mg  take 2 caps by mouth at breakfast and 2 caps at bedtime Dicyclomine 10mg  take 1 cap by mouth 4 times daily Venlafaxine 97.5 Er take 1 cap by mouth at breakfast Isosorbide Mononitrate ER 30mg  take 1 tab by mouth at breakfast Metoprolol Succinate Er 50mg  take 1 tab by mouth at breakfast Vitamin D 50000 units take 1 cap by mouth every 7 days   Patient declined the following medications nitroglycerin enough on hand   Confirmed delivery date of 12/05/20, advised patient that pharmacy will contact them the morning of delivery.   Wendy Poet, [place] [phone]   Time spent:45 minutes telephone call, reviewing chart note and documentation

## 2020-11-28 ENCOUNTER — Telehealth: Payer: Self-pay | Admitting: Internal Medicine

## 2020-11-28 ENCOUNTER — Other Ambulatory Visit: Payer: Self-pay

## 2020-11-28 DIAGNOSIS — K625 Hemorrhage of anus and rectum: Secondary | ICD-10-CM

## 2020-11-28 NOTE — Telephone Encounter (Signed)
Cbc, cmp, ibc + ferritin To er if symptoms worsen or brisk, freq rectal bleeding

## 2020-11-28 NOTE — Telephone Encounter (Signed)
Pt states she has been seeing [facility] for 5-6 weeks that she thinks are from her hemorrhoids. States it is a samll amt of blood but it does get on her pad that she wears also. States she has been feeling light headed, tired and week, pt is on plavix and a baby asa. Please advise.

## 2020-11-28 NOTE — Telephone Encounter (Signed)
Spoke with pt and she is aware of Dr. Vena Rua recommendations. Lab orders in epic.

## 2020-11-29 ENCOUNTER — Other Ambulatory Visit (INDEPENDENT_AMBULATORY_CARE_PROVIDER_SITE_OTHER): Payer: PPO

## 2020-11-29 DIAGNOSIS — K625 Hemorrhage of anus and rectum: Secondary | ICD-10-CM

## 2020-11-29 LAB — CBC WITH DIFFERENTIAL/PLATELET
Basophils Absolute: 0.1 10*3/uL (ref 0.0–0.1)
Basophils Relative: 0.9 % (ref 0.0–3.0)
Eosinophils Absolute: 0.1 10*3/uL (ref 0.0–0.7)
Eosinophils Relative: 2.1 % (ref 0.0–5.0)
HCT: 40.8 % (ref 36.0–46.0)
Hemoglobin: 13.9 g/dL (ref 12.0–15.0)
Lymphocytes Relative: 35.4 % (ref 12.0–46.0)
Lymphs Abs: 2.2 10*3/uL (ref 0.7–4.0)
MCHC: 34 g/dL (ref 30.0–36.0)
MCV: 95.3 fl (ref 78.0–100.0)
Monocytes Absolute: 0.5 10*3/uL (ref 0.1–1.0)
Monocytes Relative: 8.1 % (ref 3.0–12.0)
Neutro Abs: 3.3 10*3/uL (ref 1.4–7.7)
Neutrophils Relative %: 53.5 % (ref 43.0–77.0)
Platelets: 245 10*3/uL (ref 150.0–400.0)
RBC: 4.28 Mil/uL (ref 3.87–5.11)
RDW: 13.7 % (ref 11.5–15.5)
WBC: 6.3 10*3/uL (ref 4.0–10.5)

## 2020-11-29 LAB — COMPREHENSIVE METABOLIC PANEL
ALT: 20 U/L (ref 0–35)
AST: 18 U/L (ref 0–37)
Albumin: 4 g/dL (ref 3.5–5.2)
Alkaline Phosphatase: 60 U/L (ref 39–117)
BUN: 18 mg/dL (ref 6–23)
CO2: 34 mEq/L — ABNORMAL HIGH (ref 19–32)
Calcium: 8.9 mg/dL (ref 8.4–10.5)
Chloride: 100 mEq/L (ref 96–112)
Creatinine, Ser: 0.86 mg/dL (ref 0.40–1.20)
GFR: 63.51 mL/min (ref >60.00)
Glucose, Bld: 198 mg/dL — ABNORMAL HIGH (ref 70–99)
Potassium: 3.8 mEq/L (ref 3.5–5.1)
Sodium: 140 mEq/L (ref 135–145)
Total Bilirubin: 0.4 mg/dL (ref 0.2–1.2)
Total Protein: 6.6 g/dL (ref 6.0–8.3)

## 2020-11-29 LAB — IBC + FERRITIN
Ferritin: 27.9 ng/mL (ref 10.0–291.0)
Iron: 112 ug/dL (ref 42–145)
Saturation Ratios: 32.1 % (ref 20.0–50.0)
Transferrin: 249 mg/dL (ref 212.0–360.0)

## 2020-12-01 ENCOUNTER — Encounter: Payer: Self-pay | Admitting: Internal Medicine

## 2020-12-01 ENCOUNTER — Ambulatory Visit: Payer: PPO | Admitting: Internal Medicine

## 2020-12-01 ENCOUNTER — Other Ambulatory Visit: Payer: Self-pay

## 2020-12-01 VITALS — BP 166/82 | HR 77 | Ht 60.0 in | Wt 129.2 lb

## 2020-12-01 DIAGNOSIS — Z86010 Personal history of colonic polyps: Secondary | ICD-10-CM | POA: Diagnosis not present

## 2020-12-01 DIAGNOSIS — K602 Anal fissure, unspecified: Secondary | ICD-10-CM

## 2020-12-01 DIAGNOSIS — K648 Other hemorrhoids: Secondary | ICD-10-CM

## 2020-12-01 MED ORDER — DILTIAZEM GEL 2 %: CUTANEOUS | 0 refills | Status: DC

## 2020-12-01 NOTE — Progress Notes (Signed)
Subjective:    Patient ID: [name] [name], female    DOB: [birth year], 80 y.o.   MRN: [id number]  HPI Britanni Yarde is an 80 year old female with a past medical history of adenomatous colon polyps, internal hemorrhoids with intermittent bleeding, prior ischemic colitis, CAD on Plavix (she sees Dr. Burt Knack), hypertension, hyperlipidemia, diabetes and CKD who is seen in follow-up for persistent rectal bleeding.  She is here today with her youngest daughter who also works as a travel Therapist, sports.  The patient reports that particularly over the last 6 weeks she has been seeing blood with wiping and some blood with bowel movement and in the pad that she wears in her underwear.  She feels prolapsing hemorrhoidal tissue at times and also has some pain and throbbing sensation after defecation.  She reports that she has longstanding IBS which can lead to loose stools several times per day but most of the time stools are normal and formed.  She does not feel constipated.  There is been no melena.  No abdominal pain.  No upper GI or hepatobiliary complaint.  Her last colonoscopy to the terminal ileum was performed on 06/28/2016.  A 3 mm adenoma was removed from the descending colon.  There was left-sided diverticulosis and prolapsed internal hemorrhoids.   Review of Systems As per HPI, otherwise negative  Current Medications, Allergies, Past Medical History, Past Surgical History, Family History and Social History were reviewed in [employer] record.     Objective:   Physical Exam BP (!) 166/82   Pulse 77   Ht 5' (1.524 m)   Wt 129 lb 3.2 oz (58.6 kg)   SpO2 98%   BMI 25.23 kg/m  Gen: awake, alert, NAD HEENT: anicteric Abd: soft, NT/ND Rectal: Shallow posterior anal fissure, no rectal masses, mild tenderness posteriorly with digital exam ANOSCOPY: Using a disposable, lubricated, slotted, self-illuminating anoscope, the rectum was intubated without difficulty. The trochar was removed  and the ano-rectum was circumferentially inspected. There were internal hemorrhoids, LL>RA positions.  The rectal mucosa was not inflamed. No neoplasia or other pathology was identified. The inspection was well tolerated.  Ext: no c/c/e Neuro: nonfocal  CBC    Component Value Date/Time   WBC 6.3 11/29/2020 1058   RBC 4.28 11/29/2020 1058   HGB 13.9 11/29/2020 1058   HGB 12.8 05/17/2020 1235   HCT 40.8 11/29/2020 1058   HCT 39.1 05/17/2020 1235   PLT 245.0 11/29/2020 1058   PLT 262 05/17/2020 1235   MCV 95.3 11/29/2020 1058   MCV 96 05/17/2020 1235   MCH 31.3 05/17/2020 1235   MCH 30.9 03/10/2020 0407   MCHC 34.0 11/29/2020 1058   RDW 13.7 11/29/2020 1058   RDW 12.0 05/17/2020 1235   LYMPHSABS 2.2 11/29/2020 1058   MONOABS 0.5 11/29/2020 1058   EOSABS 0.1 11/29/2020 1058   BASOSABS 0.1 11/29/2020 1058   Iron/TIBC/Ferritin/ %Sat    Component Value Date/Time   IRON 112 11/29/2020 1058   FERRITIN 27.9 11/29/2020 1058   IRONPCTSAT 32.1 11/29/2020 1058        Assessment & Plan:  80 year old female with a past medical history of adenomatous colon polyps, internal hemorrhoids with intermittent bleeding, prior ischemic colitis, CAD on Plavix (she sees Dr. Burt Knack), hypertension, hyperlipidemia, diabetes and CKD who is seen in follow-up for persistent rectal bleeding.   1.  Bleeding and prolapsing internal hemorrhoids --she has had intermittently bleeding hemorrhoids over the last several years but the bleeding has been more persistent  in the last 6 weeks.  I checked her hemoglobin and iron studies 2 days ago and reassuringly her hemoglobin is normal, MCV was 96 and her iron studies were normal.  She takes chronic Plavix which invariably increases the amount of blood with her bleeding internal hemorrhoids.  We discussed this today.  Medical therapy is not expected to yield long-term results and we discussed hemorrhoidal banding at length today.  We discussed the risks of bleeding after  hemorrhoidal banding which is typically around 1% however with Plavix this is higher than average risk.  We also discussed that bleeding post hemorrhoidal banding can occur for up to 2 to 3 weeks and so prolonged Plavix hold is associated with cardiovascular risks.  After this discussion she wishes to proceed with hemorrhoidal banding without interrupting Plavix therapy.  She understands and accepts the higher than average risk of post banding ulceration and bleeding and will notify me if she has significant rectal bleeding after hemorrhoidal band therapy.  She also understands that additional hemorrhoidal bands will likely be needed at the next visit.   PROCEDURE NOTE:  The patient presents with symptomatic grade 3 internal hemorrhoids, requesting rubber band ligation of her hemorrhoidal disease.  All risks, benefits and alternative forms of therapy were described and informed consent was obtained.   The anorectum was pre-medicated with 0.125% nitroglycerin ointment The decision was made to band the LL internal hemorrhoid, and the Hurley was used to perform band ligation without complication.   Digital anorectal examination was then performed to assure proper positioning of the band, and to adjust the banded tissue as required.  The patient was discharged home without pain or other issues.  Dietary and behavioral recommendations were given and along with follow-up instructions.     The patient will return as scheduled for follow-up and possible additional banding as required. No complications were encountered and the patient tolerated the procedure well.  2.  Posterior anal fissure --discussed today and this is the likely cause of the discomfort she is feeling at times with defecation.  Begin diltiazem gel 2% twice daily for 2 to 6 weeks.  Apply pea-sized amount to the anal canal to the first knuckle.  3.  Personal history of adenomatous polyps --she does have a history of  adenomatous polyps as well as colon polyps in her sister.  She is interested in repeating surveillance colonoscopy which would be due around October 2022.  We spent time today discussing this procedure.  Colonoscopy is reasonable but would be also have elevated baseline risk given her age.  That said I do think that she would likely do well with repeat colonoscopy later this year.  We can discuss more at follow-up.  30 minutes total spent today including patient facing time, coordination of care, reviewing medical history/procedures/pertinent radiology studies, and documentation of the encounter.

## 2020-12-01 NOTE — Patient Instructions (Signed)
You have been scheduled for your 2nd hemorrhoidal banding on 01/03/21 at 4:00 pm.  We have sent a prescription for diltiazem gel to [facility]. You should apply a small amount to your rectum twi times daily x 4 weeks.  [facility]'s information is below: Address: [address] [place], [place] [address]  [phone]  *Please DO NOT go directly from our office to pick up this medication! Give the pharmacy 1 day to process the prescription as this is compounded and takes time to make.  If you are age 81 or older, your body mass index should be between 23-30. Your Body mass index is 25.23 kg/m. If this is out of the aforementioned range listed, please consider follow up with your Primary Care Provider.   Due to recent changes in healthcare laws, you may see the results of your imaging and laboratory studies on MyChart before your provider has had a chance to review them.  We understand that in some cases there may be results that are confusing or concerning to you. Not all laboratory results come back in the same time frame and the provider may be waiting for multiple results in order to interpret others.  Please give [place] 48 hours in order for your provider to thoroughly review all the results before contacting the office for clarification of your results.   HEMORRHOID BANDING PROCEDURE    FOLLOW-UP CARE   1. The procedure you have had should have been relatively painless since the banding of the area involved does not have nerve endings and there is no pain sensation.  The rubber band cuts off the blood supply to the hemorrhoid and the band may fall off as soon as 48 hours after the banding (the band may occasionally be seen in the toilet bowl following a bowel movement). You may notice a temporary feeling of fullness in the rectum which should respond adequately to plain Tylenol or Motrin.  2. Following the banding, avoid strenuous exercise that evening and resume full  activity the next day.  A sitz bath (soaking in a warm tub) or bidet is soothing, and can be useful for cleansing the area after bowel movements.     3. To avoid constipation, take two tablespoons of natural wheat bran, natural oat bran, flax, Benefiber or any over the counter fiber supplement and increase your water intake to 7-8 glasses daily.    4. Unless you have been prescribed anorectal medication, do not put anything inside your rectum for two weeks: No suppositories, enemas, fingers, etc.  5. Occasionally, you may have more bleeding than usual after the banding procedure.  This is often from the untreated hemorrhoids rather than the treated one.  Don't be concerned if there is a tablespoon or so of blood.  If there is more blood than this, lie flat with your bottom higher than your head and apply an ice pack to the area. If the bleeding does not stop within a half an hour or if you feel faint, call our office at [phone] or go to the emergency room.  6. Problems are not common; however, if there is a substantial amount of bleeding, severe pain, chills, fever or difficulty passing urine (very rare) or other problems, you should call us at [phone] or report to the nearest emergency room.  7. Do not stay seated continuously for more than 2-3 hours for a day or two after the procedure.  Tighten your buttock muscles  10-15 times every two hours and take 10-15 deep breaths every 1-2 hours.  Do not spend more than a few minutes on the toilet if you cannot empty your bowel; instead re-visit the toilet at a later time.

## 2020-12-08 ENCOUNTER — Other Ambulatory Visit: Payer: Self-pay | Admitting: Internal Medicine

## 2020-12-08 ENCOUNTER — Telehealth: Payer: Self-pay | Admitting: Internal Medicine

## 2020-12-08 ENCOUNTER — Telehealth: Payer: Self-pay | Admitting: Pharmacist

## 2020-12-08 DIAGNOSIS — M8949 Other hypertrophic osteoarthropathy, multiple sites: Secondary | ICD-10-CM

## 2020-12-08 DIAGNOSIS — M159 Polyosteoarthritis, unspecified: Secondary | ICD-10-CM

## 2020-12-08 MED ORDER — CELECOXIB 50 MG PO CAPS: 50.0000 mg | ORAL_CAPSULE | Freq: Two times a day (BID) | ORAL | 1 refills | Status: DC

## 2020-12-08 NOTE — Progress Notes (Signed)
Patient called today needing a delivery for her Celecoxib, she stated that she was completely out. Called to Dr. Ronnald Ramp office for new rx to be sent to [facility].  [place]

## 2020-12-08 NOTE — Telephone Encounter (Signed)
[facility] calling to request refill for celecoxib (CELEBREX) 50 MG capsule

## 2020-12-08 NOTE — Progress Notes (Addendum)
    Chronic Care Management Pharmacy Assistant   Name: [name] [name]  MRN: [id number] DOB: 04/25/1941   Reason for Encounter: Medication Coordination Call    Hospital visits:  None in previous 6 months  Medications: Outpatient Encounter Medications as of 12/08/2020  Medication Sig   Ascorbic Acid (VITAMIN C) 500 MG CAPS Take 500 mg by mouth in the morning and at bedtime.   aspirin EC 81 MG tablet Take 81 mg by mouth every morning.    atorvastatin (LIPITOR) 80 MG tablet TAKE ONE TABLET BY MOUTH EVERYDAY AT BEDTIME   celecoxib (CELEBREX) 50 MG capsule Take 1 capsule (50 mg total) by mouth 2 (two) times daily.   Cinnamon 500 MG TABS Take 1,000 mg by mouth. Once a day   clopidogrel (PLAVIX) 75 MG tablet TAKE ONE TABLET BY MOUTH EVERY MORNING   DEXILANT 60 MG capsule TAKE ONE TABLET BY MOUTH EVERY MORNING   dicyclomine (BENTYL) 10 MG capsule TAKE ONE TABLET BY MOUTH FOUR TIMES DAILY   diltiazem 2 % GEL Apply to the rectum twice daily x 4 weeks   ezetimibe (ZETIA) 10 MG tablet TAKE ONE TABLET BY MOUTH EVERY MORNING   gabapentin (NEURONTIN) 300 MG capsule TAKE TWO CAPSULES BY MOUTH BY MOUTH EVERY MORNING and TAKE TWO CAPSULES BY MOUTH EVERYDAY AT BEDTIME   Krill Oil 1000 MG CAPS Take 1,000 mg by mouth daily.   magnesium oxide (MAG-OX) 400 MG tablet Take 400 mg by mouth daily.   metoprolol succinate (TOPROL-XL) 50 MG 24 hr tablet Take 1 tablet (50 mg total) by mouth daily.   Misc Natural Products (FOCUSED MIND PO) Take 15 mg by mouth 1 day or 1 dose.   nitroGLYCERIN (NITROSTAT) 0.4 MG SL tablet Place 1 tablet (0.4 mg total) under the tongue every 5 (five) minutes as needed for chest pain.   omega-3 acid ethyl esters (LOVAZA) 1 g capsule Take 1 g by mouth 2 (two) times daily.   Psyllium (DAILY FIBER PO) Take by mouth in the morning and at bedtime.   saccharomyces boulardii (FLORASTOR) 250 MG capsule Take 250 mg by mouth 2 (two) times daily.   TURMERIC PO Take 1,000 mg by mouth. One tablet po  twice a day   venlafaxine XR (EFFEXOR-XR) 37.5 MG 24 hr capsule TAKE ONE CAPSULE BY MOUTH EVERY MORNING   Vitamin D, Ergocalciferol, (DRISDOL) 1.25 MG (50000 UNIT) CAPS capsule Take 1 capsule (50,000 Units total) by mouth every 7 (seven) days.   vitamin E 1000 UNIT capsule Take 1,000 Units by mouth in the morning and at bedtime.   No facility-administered encounter medications on file as of 12/08/2020.    Received call from patient regarding medication management via [facility].  Patient requested an acute fill for Celeocxib 50 mg to be delivered: 12/08/20 Pharmacy needs refills? No  Confirmed delivery date of 12/08/20, advised patient that pharmacy will contact them the morning of delivery.  [place] Pharmacist Assistant [phone]

## 2020-12-21 ENCOUNTER — Telehealth: Payer: Self-pay | Admitting: Pharmacist

## 2020-12-21 DIAGNOSIS — K2100 Gastro-esophageal reflux disease with esophagitis, without bleeding: Secondary | ICD-10-CM

## 2020-12-21 DIAGNOSIS — I25119 Atherosclerotic heart disease of native coronary artery with unspecified angina pectoris: Secondary | ICD-10-CM

## 2020-12-21 DIAGNOSIS — E785 Hyperlipidemia, unspecified: Secondary | ICD-10-CM

## 2020-12-21 DIAGNOSIS — I10 Essential (primary) hypertension: Secondary | ICD-10-CM

## 2020-12-21 DIAGNOSIS — I2510 Atherosclerotic heart disease of native coronary artery without angina pectoris: Secondary | ICD-10-CM

## 2020-12-21 DIAGNOSIS — F418 Other specified anxiety disorders: Secondary | ICD-10-CM

## 2020-12-21 DIAGNOSIS — E559 Vitamin D deficiency, unspecified: Secondary | ICD-10-CM

## 2020-12-22 ENCOUNTER — Ambulatory Visit (INDEPENDENT_AMBULATORY_CARE_PROVIDER_SITE_OTHER): Payer: PPO | Admitting: Internal Medicine

## 2020-12-22 ENCOUNTER — Other Ambulatory Visit: Payer: Self-pay

## 2020-12-22 ENCOUNTER — Encounter: Payer: Self-pay | Admitting: Internal Medicine

## 2020-12-22 ENCOUNTER — Other Ambulatory Visit: Payer: Self-pay | Admitting: Internal Medicine

## 2020-12-22 ENCOUNTER — Ambulatory Visit (INDEPENDENT_AMBULATORY_CARE_PROVIDER_SITE_OTHER): Payer: PPO

## 2020-12-22 VITALS — BP 146/84 | HR 87 | Temp 98.4°F | Resp 16 | Ht 60.0 in | Wt 130.0 lb

## 2020-12-22 DIAGNOSIS — M8949 Other hypertrophic osteoarthropathy, multiple sites: Secondary | ICD-10-CM

## 2020-12-22 DIAGNOSIS — M25552 Pain in left hip: Secondary | ICD-10-CM | POA: Diagnosis not present

## 2020-12-22 DIAGNOSIS — M159 Polyosteoarthritis, unspecified: Secondary | ICD-10-CM

## 2020-12-22 DIAGNOSIS — E118 Type 2 diabetes mellitus with unspecified complications: Secondary | ICD-10-CM | POA: Diagnosis not present

## 2020-12-22 DIAGNOSIS — N1832 Chronic kidney disease, stage 3b: Secondary | ICD-10-CM | POA: Diagnosis not present

## 2020-12-22 LAB — BASIC METABOLIC PANEL
BUN: 17 mg/dL (ref 6–23)
CO2: 36 mEq/L — ABNORMAL HIGH (ref 19–32)
Calcium: 8.8 mg/dL (ref 8.4–10.5)
Chloride: 99 mEq/L (ref 96–112)
Creatinine, Ser: 0.86 mg/dL (ref 0.40–1.20)
GFR: 63.48 mL/min (ref >60.00)
Glucose, Bld: 163 mg/dL — ABNORMAL HIGH (ref 70–99)
Potassium: 5.2 mEq/L — ABNORMAL HIGH (ref 3.5–5.1)
Sodium: 138 mEq/L (ref 135–145)

## 2020-12-22 LAB — HEMOGLOBIN A1C: Hgb A1c MFr Bld: 7.1 % — ABNORMAL HIGH (ref 4.6–6.5)

## 2020-12-22 MED ORDER — TRAMADOL HCL 50 MG PO TABS: 50.0000 mg | ORAL_TABLET | Freq: Four times a day (QID) | ORAL | 5 refills | Status: DC | PRN

## 2020-12-22 NOTE — Progress Notes (Signed)
Subjective:  Patient ID: [name] [name], female    DOB: May 25, 1941  Age: 80 y.o. MRN: [id number]  CC: Osteoarthritis, Hypertension, and Diabetes  This visit occurred during the SARS-CoV-2 public health emergency.  Safety protocols were in place, including screening questions prior to the visit, additional usage of staff PPE, and extensive cleaning of exam room while observing appropriate contact time as indicated for disinfecting solutions.    HPI [name] [name] presents for f/up -  She has chronic hip pain but now complains of a 2-day history of worsening left hip pain.  She denies any specific trauma or injury but said she did twist which she said caused the pain.  The pain does not radiate.  She denies back pain or lower extremity paresthesias.  She is not getting much symptom relief with Tylenol.  Outpatient Medications Prior to Visit  Medication Sig Dispense Refill  . Ascorbic Acid (VITAMIN C) 500 MG CAPS Take 500 mg by mouth in the morning and at bedtime.    Marland Kitchen aspirin EC 81 MG tablet Take 81 mg by mouth every morning.     Marland Kitchen atorvastatin (LIPITOR) 80 MG tablet TAKE ONE TABLET BY MOUTH EVERYDAY AT BEDTIME 90 tablet 1  . celecoxib (CELEBREX) 50 MG capsule Take 1 capsule (50 mg total) by mouth 2 (two) times daily. 180 capsule 1  . Cinnamon 500 MG TABS Take 1,000 mg by mouth. Once a day    . clopidogrel (PLAVIX) 75 MG tablet TAKE ONE TABLET BY MOUTH EVERY MORNING 90 tablet 1  . DEXILANT 60 MG capsule TAKE ONE TABLET BY MOUTH EVERY MORNING 90 capsule 1  . dicyclomine (BENTYL) 10 MG capsule TAKE ONE TABLET BY MOUTH FOUR TIMES DAILY 360 capsule 1  . diltiazem 2 % GEL Apply to the rectum twice daily x 4 weeks 30 g 0  . ezetimibe (ZETIA) 10 MG tablet TAKE ONE TABLET BY MOUTH EVERY MORNING 90 tablet 1  . gabapentin (NEURONTIN) 300 MG capsule TAKE TWO CAPSULES BY MOUTH BY MOUTH EVERY MORNING and TAKE TWO CAPSULES BY MOUTH EVERYDAY AT BEDTIME 360 capsule 1  . Krill Oil 1000 MG CAPS Take 1,000  mg by mouth daily.    . magnesium oxide (MAG-OX) 400 MG tablet Take 400 mg by mouth daily.    . metoprolol succinate (TOPROL-XL) 50 MG 24 hr tablet Take 1 tablet (50 mg total) by mouth daily. 90 tablet 3  . Misc Natural Products (FOCUSED MIND PO) Take 15 mg by mouth 1 day or 1 dose.    . nitroGLYCERIN (NITROSTAT) 0.4 MG SL tablet Place 1 tablet (0.4 mg total) under the tongue every 5 (five) minutes as needed for chest pain. 25 tablet 2  . omega-3 acid ethyl esters (LOVAZA) 1 g capsule Take 1 g by mouth 2 (two) times daily.    . Psyllium (DAILY FIBER PO) Take by mouth in the morning and at bedtime.    . saccharomyces boulardii (FLORASTOR) 250 MG capsule Take 250 mg by mouth 2 (two) times daily.    . TURMERIC PO Take 1,000 mg by mouth. One tablet po twice a day    . venlafaxine XR (EFFEXOR-XR) 37.5 MG 24 hr capsule TAKE ONE CAPSULE BY MOUTH EVERY MORNING 90 capsule 1  . Vitamin D, Ergocalciferol, (DRISDOL) 1.25 MG (50000 UNIT) CAPS capsule Take 1 capsule (50,000 Units total) by mouth every 7 (seven) days. 12 capsule 0  . vitamin E 1000 UNIT capsule Take 1,000 Units by mouth in  the morning and at bedtime.     No facility-administered medications prior to visit.    ROS Review of Systems  Constitutional: Negative.  Negative for chills, diaphoresis, fatigue and fever.  HENT: Negative.   Eyes: Negative for visual disturbance.  Respiratory: Negative for chest tightness, shortness of breath and wheezing.   Cardiovascular: Negative for chest pain, palpitations and leg swelling.  Gastrointestinal: Negative for abdominal pain, constipation, diarrhea, nausea and vomiting.  Endocrine: Negative.   Genitourinary: Negative.  Negative for difficulty urinating and dysuria.  Musculoskeletal: Positive for arthralgias. Negative for back pain.  Skin: Negative.  Negative for color change and pallor.  Neurological: Negative.  Negative for dizziness, weakness and headaches.  Hematological: Negative for  adenopathy. Does not bruise/bleed easily.  Psychiatric/Behavioral: Negative.     Objective:  BP (!) 146/84 (BP Location: Left Arm, Patient Position: Sitting, Cuff Size: Normal)   Pulse 87   Temp 98.4 F (36.9 C) (Oral)   Resp 16   Ht 5' (1.524 m)   Wt 130 lb (59 kg)   SpO2 93%   BMI 25.39 kg/m   BP Readings from Last 3 Encounters:  12/22/20 (!) 146/84  12/01/20 (!) 166/82  10/10/20 138/82    Wt Readings from Last 3 Encounters:  12/22/20 130 lb (59 kg)  12/01/20 129 lb 3.2 oz (58.6 kg)  10/10/20 127 lb (57.6 kg)    Physical Exam Vitals reviewed.  HENT:     Nose: Nose normal.     Mouth/Throat:     Mouth: Mucous membranes are moist.  Eyes:     General: No scleral icterus.    Conjunctiva/sclera: Conjunctivae normal.  Cardiovascular:     Rate and Rhythm: Normal rate and regular rhythm.     Pulses: Normal pulses.     Heart sounds: No murmur heard.   Pulmonary:     Effort: Pulmonary effort is normal.     Breath sounds: No stridor. No wheezing, rhonchi or rales.  Abdominal:     General: Abdomen is flat.     Palpations: There is no mass.     Tenderness: There is no abdominal tenderness. There is no guarding.  Musculoskeletal:        General: Normal range of motion.     Cervical back: Normal and neck supple.     Lumbar back: Normal. No tenderness or bony tenderness. Normal range of motion. Negative right straight leg raise test and negative left straight leg raise test.     Right hip: Normal.     Left hip: Normal. No deformity or bony tenderness. Normal range of motion. Normal strength.     Right lower leg: No edema.  Lymphadenopathy:     Cervical: No cervical adenopathy.  Skin:    General: Skin is warm and dry.  Neurological:     General: No focal deficit present.     Mental Status: She is alert.     Lab Results  Component Value Date   WBC 6.3 11/29/2020   HGB 13.9 11/29/2020   HCT 40.8 11/29/2020   PLT 245.0 11/29/2020   GLUCOSE 163 (H) 12/22/2020    CHOL 112 09/12/2020   TRIG 244.0 (H) 09/12/2020   HDL 48.80 09/12/2020   LDLDIRECT 42.0 09/12/2020   LDLCALC 26 04/30/2019   ALT 20 11/29/2020   AST 18 11/29/2020   NA 138 12/22/2020   K 5.2 No hemolysis seen (H) 12/22/2020   CL 99 12/22/2020   CREATININE 0.86 12/22/2020   BUN 17  12/22/2020   CO2 36 (H) 12/22/2020   TSH 1.54 09/12/2020   INR 1.09 01/23/2018   HGBA1C 7.1 (H) 12/22/2020   MICROALBUR 1.9 09/12/2020    DG HIP UNILAT WITH PELVIS 2-3 VIEWS LEFT  Result Date: 12/22/2020 CLINICAL DATA:  Radiating pain.  Prior history of fall. EXAM: DG HIP (WITH OR WITHOUT PELVIS) 2-3V LEFT COMPARISON:  01/23/2018. FINDINGS: Degenerative changes both hips again noted. Subchondral cysts about the acetabulum are most consistent with degenerative change. Lucency noted over the left ilium most likely bowel gas. No acute bony or joint abnormality. No evidence of fracture or dislocation. Abdominopelvic calcifications most consistent with phleboliths. Surgical clips noted the pelvis. IMPRESSION: Degenerative changes both hips again noted. No acute bony or joint abnormality. Electronically Signed   By: Marcello Moores  Register   On: 12/22/2020 15:09    Assessment & Plan:   Christell was seen today for osteoarthritis, hypertension and diabetes.  Diagnoses and all orders for this visit:  Controlled type 2 diabetes mellitus with complication, without long-term current use of insulin ([place])- Her blood sugar is adequately well controlled. -     Basic metabolic panel; Future -     Hemoglobin A1c; Future -     Hemoglobin A1c -     Basic metabolic panel  Stage 3b chronic kidney disease ([place])- Her renal function is stable. -     Basic metabolic panel; Future -     Hemoglobin A1c; Future -     Hemoglobin A1c -     Basic metabolic panel  Acute hip pain, left- Plain films are negative for fracture.  She likely has an exacerbation of OA.  I recommended that she take tramadol as needed. -     Cancel: DG Hip Unilat  W OR W/O Pelvis Min 4 Views Left; Future  Primary osteoarthritis involving multiple joints -     traMADol (ULTRAM) 50 MG tablet; Take 1 tablet (50 mg total) by mouth every 6 (six) hours as needed.   I am having Hermina F. Isidore "FAYE" start on traMADol. I am also having her maintain her Vitamin C, aspirin EC, metoprolol succinate, Krill Oil, nitroGLYCERIN, venlafaxine XR, dicyclomine, clopidogrel, ezetimibe, Dexilant, atorvastatin, gabapentin, Cinnamon, TURMERIC PO, magnesium oxide, vitamin E, saccharomyces boulardii, Psyllium (DAILY FIBER PO), omega-3 acid ethyl esters, Misc Natural Products (FOCUSED MIND PO), Vitamin D (Ergocalciferol), diltiazem, and celecoxib.  Meds ordered this encounter  Medications  . traMADol (ULTRAM) 50 MG tablet    Sig: Take 1 tablet (50 mg total) by mouth every 6 (six) hours as needed.    Dispense:  75 tablet    Refill:  5     Follow-up: Return in about 6 months (around 06/23/2021).  [name], MD

## 2020-12-22 NOTE — Patient Instructions (Signed)
Osteoarthritis  Osteoarthritis is a type of arthritis. It refers to joint pain or joint disease. Osteoarthritis affects tissue that covers the ends of bones in joints (cartilage). Cartilage acts as a cushion between the bones and helps them move smoothly. Osteoarthritis occurs when cartilage in the joints gets worn down. Osteoarthritis is sometimes called "wear and tear" arthritis. Osteoarthritis is the most common form of arthritis. It often occurs in older people. It is a condition that gets worse over time. The joints most often affected by this condition are in the fingers, toes, hips, knees, and spine, including the neck and lower back. What are the causes? This condition is caused by the wearing down of cartilage that covers the ends of bones. What increases the risk? The following factors may make you more likely to develop this condition:  Being age 50 or older.  Obesity.  Overuse of joints.  Past injury of a joint.  Past surgery on a joint.  Family history of osteoarthritis. What are the signs or symptoms? The main symptoms of this condition are pain, swelling, and stiffness in the joint. Other symptoms may include:  An enlarged joint.  More pain and further damage caused by small pieces of bone or cartilage that break off and float inside of the joint.  Small deposits of bone (osteophytes) that grow on the edges of the joint.  A grating or scraping feeling inside the joint when you move it.  Popping or creaking sounds when you move.  Difficulty walking or exercising.  An inability to grip items, twist your hand(s), or control the movements of your hands and fingers. How is this diagnosed? This condition may be diagnosed based on:  Your medical history.  A physical exam.  Your symptoms.  X-rays of the affected joint(s).  Blood tests to rule out other types of arthritis. How is this treated? There is no cure for this condition, but treatment can help control  pain and improve joint function. Treatment may include a combination of therapies, such as:  Pain relief techniques, such as: ? Applying heat and cold to the joint. ? Massage. ? A form of talk therapy called cognitive behavioral therapy (CBT). This therapy helps you set goals and follow up on the changes that you make.  Medicines for pain and inflammation. The medicines can be taken by mouth or applied to the skin. They include: ? NSAIDs, such as ibuprofen. ? Prescription medicines. ? Strong anti-inflammatory medicines (corticosteroids). ? Certain nutritional supplements.  A prescribed exercise program. You may work with a physical therapist.  Assistive devices, such as a brace, wrap, splint, specialized glove, or cane.  A weight control plan.  Surgery, such as: ? An osteotomy. This is done to reposition the bones and relieve pain or to remove loose pieces of bone and cartilage. ? Joint replacement surgery. You may need this surgery if you have advanced osteoarthritis. Follow these instructions at home: Activity  Rest your affected joints as told by your health care provider.  Exercise as told by your health care provider. He or she may recommend specific types of exercise, such as: ? Strengthening exercises. These are done to strengthen the muscles that support joints affected by arthritis. ? Aerobic activities. These are exercises, such as brisk walking or water aerobics, that increase your heart rate. ? Range-of-motion activities. These help your joints move more easily. ? Balance and agility exercises. Managing pain, stiffness, and swelling  If directed, apply heat to the affected area as often   as told by your health care provider. Use the heat source that your health care provider recommends, such as a moist heat pack or a heating pad. ? If you have a removable assistive device, remove it as told by your health care provider. ? Place a towel between your skin and the heat  source. If your health care provider tells you to keep the assistive device on while you apply heat, place a towel between the assistive device and the heat source. ? Leave the heat on for 20-30 minutes. ? Remove the heat if your skin turns bright red. This is especially important if you are unable to feel pain, heat, or cold. You may have a greater risk of getting burned.  If directed, put ice on the affected area. To do this: ? If you have a removable assistive device, remove it as told by your health care provider. ? Put ice in a plastic bag. ? Place a towel between your skin and the bag. If your health care provider tells you to keep the assistive device on during icing, place a towel between the assistive device and the bag. ? Leave the ice on for 20 minutes, 2-3 times a day. ? Move your fingers or toes often to reduce stiffness and swelling. ? Raise (elevate) the injured area above the level of your heart while you are sitting or lying down.      General instructions  Take over-the-counter and prescription medicines only as told by your health care provider.  Maintain a healthy weight. Follow instructions from your health care provider for weight control.  Do not use any products that contain nicotine or tobacco, such as cigarettes, e-cigarettes, and chewing tobacco. If you need help quitting, ask your health care provider.  Use assistive devices as told by your health care provider.  Keep all follow-up visits as told by your health care provider. This is important. Where to find more information  National Institute of Arthritis and Musculoskeletal and Skin Diseases: www.niams.nih.gov  National Institute on Aging: www.nia.nih.gov  American College of Rheumatology: www.rheumatology.org Contact a health care provider if:  You have redness, swelling, or a feeling of warmth in a joint that gets worse.  You have a fever along with joint or muscle aches.  You develop a  rash.  You have trouble doing your normal activities. Get help right away if:  You have pain that gets worse and is not relieved by pain medicine. Summary  Osteoarthritis is a type of arthritis that affects tissue covering the ends of bones in joints (cartilage).  This condition is caused by the wearing down of cartilage that covers the ends of bones.  The main symptom of this condition is pain, swelling, and stiffness in the joint.  There is no cure for this condition, but treatment can help control pain and improve joint function. This information is not intended to replace advice given to you by your health care provider. Make sure you discuss any questions you have with your health care provider. Document Revised: 08/17/2019 Document Reviewed: 08/17/2019 Elsevier Patient Education  2021 [employer]  

## 2020-12-29 NOTE — Progress Notes (Signed)
Opened in error

## 2021-01-02 ENCOUNTER — Other Ambulatory Visit: Payer: Self-pay | Admitting: Internal Medicine

## 2021-01-02 ENCOUNTER — Telehealth: Payer: Self-pay | Admitting: Internal Medicine

## 2021-01-02 DIAGNOSIS — M25552 Pain in left hip: Secondary | ICD-10-CM

## 2021-01-02 NOTE — Telephone Encounter (Signed)
Referred to sports med

## 2021-01-02 NOTE — Telephone Encounter (Signed)
   Patient calling to report since her last visit the pain has not gotten better. She is taking the Tramadol Her hip pain is worse   Seeking advice Declined appointment at this time

## 2021-01-02 NOTE — Telephone Encounter (Signed)
Pt has been informed.

## 2021-01-03 ENCOUNTER — Encounter: Payer: PPO | Admitting: Internal Medicine

## 2021-01-04 ENCOUNTER — Other Ambulatory Visit: Payer: Self-pay

## 2021-01-04 ENCOUNTER — Ambulatory Visit: Payer: PPO | Admitting: Family Medicine

## 2021-01-04 ENCOUNTER — Encounter: Payer: Self-pay | Admitting: Family Medicine

## 2021-01-04 ENCOUNTER — Telehealth: Payer: PPO

## 2021-01-04 VITALS — BP 162/80 | HR 90 | Ht 60.0 in | Wt 128.0 lb

## 2021-01-04 DIAGNOSIS — M25552 Pain in left hip: Secondary | ICD-10-CM

## 2021-01-04 MED ORDER — HYDROCODONE-ACETAMINOPHEN 5-325 MG PO TABS: 1.0000 | ORAL_TABLET | Freq: Four times a day (QID) | ORAL | 0 refills | Status: DC | PRN

## 2021-01-04 NOTE — Progress Notes (Deleted)
Chronic Care Management Pharmacy Note  01/04/2021 Name:  [name] [name] MRN:  [id number] DOB:  [birth year]  Subjective: [name] [name] is an 80 y.o. year old female who is a primary patient of Janith Lima, MD.  The CCM team was consulted for assistance with disease management and care coordination needs.    Engaged with patient by telephone for follow up visit in response to provider referral for pharmacy case management and/or care coordination services.   Consent to Services:  The patient was given information about Chronic Care Management services, agreed to services, and gave verbal consent prior to initiation of services.  Please see initial visit note for detailed documentation.   Patient Care Team: Janith Lima, MD as PCP - General (Internal Medicine) Sherren Mocha, MD as PCP - Cardiology (Cardiology) Sherren Mocha, MD as Consulting Physician (Cardiology) Tat, Eustace Quail, DO as Consulting Physician (Neurology) Marchia Bond, MD as Consulting Physician (Orthopedic Surgery) Charlton Haws, [facility] (Pharmacist)  Recent office visits: 12/22/20 Dr Ronnald Ramp OV: c/o chronic hip pain. Rx'd tramadol. Referred to sports med.  Recent consult visits: ***  Hospital visits: {Hospital DC Yes/No:25215}  Objective:  Lab Results  Component Value Date   CREATININE 0.86 12/22/2020   BUN 17 12/22/2020   GFR 63.48 12/22/2020   GFRNONAA 61 05/17/2020   GFRAA 71 05/17/2020   NA 138 12/22/2020   K 5.2 No hemolysis seen (H) 12/22/2020   CALCIUM 8.8 12/22/2020   CO2 36 (H) 12/22/2020   GLUCOSE 163 (H) 12/22/2020    Lab Results  Component Value Date/Time   HGBA1C 7.1 (H) 12/22/2020 02:37 PM   HGBA1C 7.2 (H) 09/12/2020 03:16 PM   GFR 63.48 12/22/2020 02:37 PM   GFR 63.51 11/29/2020 10:58 AM   MICROALBUR 1.9 09/12/2020 03:16 PM   MICROALBUR 2.6 (H) 07/15/2018 10:24 AM    Last diabetic Eye exam:  Lab Results  Component Value Date/Time   HMDIABEYEEXA No Retinopathy  04/27/2019 12:00 AM    Last diabetic Foot exam:  Lab Results  Component Value Date/Time   HMDIABFOOTEX done 02/10/2014 12:00 AM     Lab Results  Component Value Date   CHOL 112 09/12/2020   HDL 48.80 09/12/2020   LDLCALC 26 04/30/2019   LDLDIRECT 42.0 09/12/2020   TRIG 244.0 (H) 09/12/2020   CHOLHDL 2 09/12/2020    Hepatic Function Latest Ref Rng & Units 11/29/2020 09/12/2020 09/29/2019  Total Protein 6.0 - 8.3 g/dL 6.6 6.5 6.9  Albumin 3.5 - 5.2 g/dL 4.0 4.0 4.1  AST 0 - 37 U/L 18 20 20   ALT 0 - 35 U/L 20 19 18   Alk Phosphatase 39 - 117 U/L 60 60 53  Total Bilirubin 0.2 - 1.2 mg/dL 0.4 0.3 0.3  Bilirubin, Direct 0.0 - 0.3 mg/dL - 0.1 0.1    Lab Results  Component Value Date/Time   TSH 1.54 09/12/2020 03:16 PM   TSH 2.43 07/15/2018 10:24 AM    CBC Latest Ref Rng & Units 11/29/2020 09/12/2020 05/17/2020  WBC 4.0 - 10.5 K/uL 6.3 7.4 10.3  Hemoglobin 12.0 - 15.0 g/dL 13.9 13.3 12.8  Hematocrit 36.0 - 46.0 % 40.8 40.3 39.1  Platelets 150.0 - 400.0 K/uL 245.0 244.0 262    Lab Results  Component Value Date/Time   VD25OH 65.82 07/15/2018 10:24 AM   VD25OH 49 06/15/2013 03:22 PM   VD25OH 38 11/10/2008 08:47 PM    Clinical ASCVD: {YES/NO:21197} The ASCVD Risk score Mikey Bussing DC Jr., et al., 2013) failed  to calculate for the following reasons:   The 2013 ASCVD risk score is only valid for ages 60 to 6    Depression screen PHQ 2/9 09/12/2020 03/03/2020 04/30/2019  Decreased Interest 0 0 0  Down, Depressed, Hopeless 0 0 0  PHQ - 2 Score 0 0 0  Altered sleeping - - 0  Tired, decreased energy - - 0  Change in appetite - - 0  Feeling bad or failure about yourself  - - 0  Trouble concentrating - - 0  Moving slowly or fidgety/restless - - 0  Suicidal thoughts - - 0  PHQ-9 Score - - 0  Difficult doing work/chores - - Not difficult at all  Some recent data might be hidden     ***Other: (CHADS2VASc if Afib, MMRC or CAT for COPD, ACT, DEXA)  Social History   Tobacco Use   Smoking Status Never Smoker  Smokeless Tobacco Never Used   BP Readings from Last 3 Encounters:  12/22/20 (!) 146/84  12/01/20 (!) 166/82  10/10/20 138/82   Pulse Readings from Last 3 Encounters:  12/22/20 87  12/01/20 77  10/10/20 81   Wt Readings from Last 3 Encounters:  12/22/20 130 lb (59 kg)  12/01/20 129 lb 3.2 oz (58.6 kg)  10/10/20 127 lb (57.6 kg)   BMI Readings from Last 3 Encounters:  12/22/20 25.39 kg/m  12/01/20 25.23 kg/m  10/10/20 24.80 kg/m    Assessment/Interventions: Review of patient past medical history, allergies, medications, health status, including review of consultants reports, laboratory and other test data, was performed as part of comprehensive evaluation and provision of chronic care management services.   SDOH:  (Social Determinants of Health) assessments and interventions performed: {yes/no:20286}  SDOH Screenings   Alcohol Screen: Low Risk   . Last Alcohol Screening Score (AUDIT): 0  Depression (PHQ2-9): Low Risk   . PHQ-2 Score: 0  Financial Resource Strain: Low Risk   . Difficulty of Paying Living Expenses: Not hard at all  Food Insecurity: No Food Insecurity  . Worried About Charity fundraiser in the Last Year: Never true  . Ran Out of Food in the Last Year: Never true  Housing: Low Risk   . Last Housing Risk Score: 0  Physical Activity: Sufficiently Active  . Days of Exercise per Week: 5 days  . Minutes of Exercise per Session: 30 min  Social Connections: Moderately Integrated  . Frequency of Communication with Friends and Family: More than three times a week  . Frequency of Social Gatherings with Friends and Family: More than three times a week  . Attends Religious Services: More than 4 times per year  . Active Member of Clubs or Organizations: Yes  . Attends Archivist Meetings: More than 4 times per year  . Marital Status: Widowed  Stress: No Stress Concern Present  . Feeling of Stress : Not at all  Tobacco  Use: Low Risk   . Smoking Tobacco Use: Never Smoker  . Smokeless Tobacco Use: Never Used  Transportation Needs: No Transportation Needs  . Lack of Transportation (Medical): No  . Lack of Transportation (Non-Medical): No    CCM Care Plan  Allergies  Allergen Reactions  . Nitrofurantoin Nausea Only    Severe nausea  . Niacin And Related Other (See Comments)    Must take "Flush-free"  . Ceftriaxone Itching  . Codeine Nausea Only  . Prednisone Itching and Rash  . Sulfonamide Derivatives Itching  . Tetracycline Itching and Rash  Medications Reviewed Today    Reviewed by Janith Lima, MD (Physician) on 12/22/20 at 1541  Med List Status: <None>  Medication Order Taking? Sig Documenting Provider Last Dose Status Informant  Ascorbic Acid (VITAMIN C) 500 MG CAPS [id number] Yes Take 500 mg by mouth in the morning and at bedtime. [provider] Taking Active Self  aspirin EC 81 MG tablet [id number] Yes Take 81 mg by mouth every morning.  [provider] Taking Active Self  atorvastatin (LIPITOR) 80 MG tablet [id number] Yes TAKE ONE TABLET BY MOUTH EVERYDAY AT BEDTIME Janith Lima, MD Taking Active   celecoxib (CELEBREX) 50 MG capsule [id number] Yes Take 1 capsule (50 mg total) by mouth 2 (two) times daily. Janith Lima, MD Taking Active   Cinnamon 500 MG TABS [id number] Yes Take 1,000 mg by mouth. Once a day [provider] Taking Active Self  clopidogrel (PLAVIX) 75 MG tablet [id number] Yes TAKE ONE TABLET BY MOUTH EVERY MORNING Janith Lima, MD Taking Active   DEXILANT 60 MG capsule [id number] Yes TAKE ONE TABLET BY MOUTH EVERY MORNING Janith Lima, MD Taking Active   dicyclomine (BENTYL) 10 MG capsule [id number] Yes TAKE ONE TABLET BY MOUTH FOUR TIMES DAILY Janith Lima, MD Taking Active   diltiazem 2 % GEL [id number] Yes Apply to the rectum twice daily x 4 weeks Pyrtle, Lajuan Lines, MD Taking Active   ezetimibe (ZETIA) 10 MG tablet [id number] Yes TAKE ONE  TABLET BY MOUTH EVERY MORNING Janith Lima, MD Taking Active   gabapentin (NEURONTIN) 300 MG capsule [id number] Yes TAKE TWO CAPSULES BY MOUTH BY MOUTH EVERY MORNING and TAKE TWO CAPSULES BY MOUTH EVERYDAY AT BEDTIME Janith Lima, MD Taking Active   Krill Oil 1000 MG CAPS [id number] Yes Take 1,000 mg by mouth daily. [provider] Taking Active Self  magnesium oxide (MAG-OX) 400 MG tablet [id number] Yes Take 400 mg by mouth daily. [provider] Taking Active   metoprolol succinate (TOPROL-XL) 50 MG 24 hr tablet [id number] Yes Take 1 tablet (50 mg total) by mouth daily. Sherren Mocha, MD Taking Active Self  Misc Natural Products (FOCUSED MIND PO) [id number] Yes Take 15 mg by mouth 1 day or 1 dose. [provider] Taking Active Self  nitroGLYCERIN (NITROSTAT) 0.4 MG SL tablet [id number] Yes Place 1 tablet (0.4 mg total) under the tongue every 5 (five) minutes as needed for chest pain. Janith Lima, MD Taking Active   omega-3 acid ethyl esters (LOVAZA) 1 g capsule [id number] Yes Take 1 g by mouth 2 (two) times daily. [provider] Taking Active Self  Psyllium (DAILY FIBER PO) [id number] Yes Take by mouth in the morning and at bedtime. [provider] Taking Active Self  saccharomyces boulardii (FLORASTOR) 250 MG capsule [id number] Yes Take 250 mg by mouth 2 (two) times daily. [provider] Taking Active   traMADol (ULTRAM) 50 MG tablet [id number] Yes Take 1 tablet (50 mg total) by mouth every 6 (six) hours as needed. Janith Lima, MD  Active   TURMERIC PO [id number] Yes Take 1,000 mg by mouth. One tablet po twice a day [provider] Taking Active Self  venlafaxine XR (EFFEXOR-XR) 37.5 MG 24 hr capsule [id number] Yes TAKE ONE CAPSULE BY MOUTH EVERY MORNING Janith Lima, MD Taking Active   Vitamin D, Ergocalciferol, (DRISDOL) 1.25 MG (50000 UNIT) CAPS capsule [id number] Yes Take 1 capsule (50,000 Units total) by mouth every 7 (seven)  days. [name]  L, MD Taking Active   vitamin E 1000 UNIT capsule [id number] Yes Take 1,000 Units by mouth in the morning and at bedtime. [provider] Taking Active           Patient Active Problem List   Diagnosis Date Noted  . Acute hip pain, left 12/22/2020  . Stage 3b chronic kidney disease ([place]) 09/13/2020  . Unstable angina ([place])   . History of bacterial endocarditis 10/28/2018  . Episodic tension-type headache, not intractable 11/08/2014  . Other seasonal allergic rhinitis 07/08/2014  . Diabetic peripheral neuropathy ([place]) 06/29/2014  . Routine general medical examination at a health care facility 01/20/2014  . Other screening mammogram 01/20/2014  . Essential hypertension 08/03/2009  . CAD (coronary artery disease) 01/14/2008  . Controlled diabetes mellitus type 2 with complications ([place]) 28/00/3491  . Hyperlipidemia with target LDL less than 100 06/13/2007  . Depression with anxiety 06/13/2007  . GASTROESOPHAGEAL REFLUX DISEASE 06/13/2007  . Osteoarthritis 06/13/2007    Immunization History  Administered Date(s) Administered  . Fluad Quad(high Dose 65+) 04/30/2019, 06/27/2020  . Influenza Split 07/18/2011, 05/19/2012  . Influenza Whole 06/22/2009, 05/15/2010  . Influenza, High Dose Seasonal PF 07/20/2015, 05/24/2016, 06/18/2018  . Influenza,inj,Quad PF,6+ Mos 06/15/2013, 05/21/2014  . Influenza-Unspecified 06/24/2017  . Pneumococcal Conjugate-13 01/20/2014  . Pneumococcal Polysaccharide-23 11/08/2014, 11/13/2017  . Tdap 01/20/2014  . Zoster 07/08/2014    Conditions to be addressed/monitored:  {USCCMDZASSESSMENTOPTIONS:23563}  There are no care plans that you recently modified to display for this patient.    Medication Assistance: {MEDASSISTANCEINFO:25044}  Patient's preferred pharmacy is:  [facility] - [place], [place] - [address] [place] [address] Phone: [phone] Fax:  [phone]  [place], [place] [address] Phone: [phone] Fax: [phone]  Uses pill box? {Yes or If no, why not?:20788} Pt endorses ***% compliance  We discussed: {Pharmacy options:24294} Patient decided to: {US Pharmacy Plan:23885}  Care Plan and Follow Up Patient Decision:  {FOLLOWUP:24991}  Plan: {CM FOLLOW UP EBRA:30940}  ***    Current Barriers:  . {pharmacybarriers:24917}  Pharmacist Clinical Goal(s):  Marland Kitchen Patient will {PHARMACYGOALCHOICES:24921} through collaboration with PharmD and provider.   Interventions: . 1:1 collaboration with Janith Lima, MD regarding development and update of comprehensive plan of care as evidenced by provider attestation and co-signature . Inter-disciplinary care team collaboration (see longitudinal plan of care) . Comprehensive medication review performed; medication list updated in electronic medical record  Diabetes   A1c goal < 7%  Patient has failed these meds in past: glimepiride, metformin Patient is currently controlled on the following medications:   no medications indicated  We discussed: A1c is well controlled without medications; congratulated patient on good control with diet and exercise.  Plan: Continue control with diet and exercise   Hypertension   BP goal < 140/90 Patient checks BP at home 1-2x per week Patient home BP readings are ranging: SBP 120-130s  Patient has failed these meds in the past: n/a Patient is currently controlled on the following medications:   metoprolol succinate 50 mg daily  olmesartan 40 mg daily  isosorbide MN 30 mg daily  We discussed: BP goals; pt recently stopped amlodipine and reduced dose of isosorbide due to symptomatic hypotension; pt reports BP has been higher and she feels well  Plan: Continue current medications and control with diet and exercise    Hyperlipidemia /  CAD   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 Myoview 3/21: no ischemia present. LDL goal < 70  Patient has failed these meds in past: n/a Patient is currently controlled on the following medications:   atorvastatin 80 mg daily HS,   ezetimibe 10 mg daily,  aspirin 81 mg daily,   Isosorbide MN 60 mg daily  clopidogrel 75 mg daily,   nitroglycerin 0.4 mg SL prn,   fish oil OTC   We discussed:  Cholesterol goals; benefits of statin for ASCVD risk reduction; importance of DAPT for stent patency; pt has not had to use NTG recently  Plan: Continue current medications and control with diet and exercise   Patient Goals/Self-Care Activities . Patient will:  - {pharmacypatientgoals:24919}  Follow Up Plan: {CM FOLLOW UP KGYB:71278}

## 2021-01-04 NOTE — Progress Notes (Signed)
Rito Ehrlich, am serving as a Education administrator for Dr. Lynne Leader.  Subjective:    I'm seeing this patient as a consultation for: Dr. [name]. Note will be routed back to referring provider/PCP.  CC: Left hip pain  HPI: Pt is an 80 y/o female c/o chronic L hip pain that worsen around 12/20/20. MOI: Pt suspects a "twist" mechanism which may have injured hip. Pt locates pain to Low L buttock pain that radiates down the lateral L leg and sometimes get pain in the L groin. Describes pain as sharp when walking and dull/aching when sitting. Patient did have a fall three years ago on the L hip and had a hairline fracture.   Radiates: yes down L leg  LE Numbness/tingling: no LE Weakness: yes Aggravates: walking; sitting to standing Treatments tried: Tylenol, tramadol, ice, heat   Dx imaging: 12/22/20 L hip XR  01/29/18 L femur CT  01/23/18 L femur & L hip XR  Past medical history, Surgical history, Family history, Social history, Allergies, and medications have been entered into the medical record, reviewed.   Review of Systems: No new headache, visual changes, nausea, vomiting, diarrhea, constipation, dizziness, abdominal pain, skin rash, fevers, chills, night sweats, weight loss, swollen lymph nodes, body aches, joint swelling, muscle aches, chest pain, shortness of breath, mood changes, visual or auditory hallucinations.   Objective:    Vitals:   01/04/21 1449  BP: (!) 162/80  Pulse: 90  SpO2: 94%   General: Well Developed, well nourished, and in no acute distress.  Neuro/Psych: Alert and oriented x3, extra-ocular muscles intact, able to move all 4 extremities, sensation grossly intact. Skin: Warm and dry, no rashes noted.  Respiratory: Not using accessory muscles, speaking in full sentences, trachea midline.  Cardiovascular: Pulses palpable, no extremity edema. Abdomen: Does not appear distended. MSK: Left hip normal-appearing Normal motion. Tender palpation greater  trochanter. Hip abduction strength 3/5 with pain. External rotation strength 4/5 with pain. Antalgic gait.  Lab and Radiology Results  EXAM: DG HIP (WITH OR WITHOUT PELVIS) 2-3V LEFT  COMPARISON:  01/23/2018.  FINDINGS: Degenerative changes both hips again noted. Subchondral cysts about the acetabulum are most consistent with degenerative change. Lucency noted over the left ilium most likely bowel gas. No acute bony or joint abnormality. No evidence of fracture or dislocation. Abdominopelvic calcifications most consistent with phleboliths. Surgical clips noted the pelvis.  IMPRESSION: Degenerative changes both hips again noted. No acute bony or joint abnormality.   Electronically Signed   By: Marcello Moores  Register   On: 12/22/2020 15:09 I, Lynne Leader, personally (independently) visualized and performed the interpretation of the images attached in this note.   Impression and Recommendations:    Assessment and Plan: 79 y.o. female with left lateral hip and buttocks pain due to hip abductor tendon injury/tendinitis and trochanteric bursitis.  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  Plan for physical therapy.  She lives in a second floor apartment is having trouble walking down the stairs to get to appointments therefore she is a good candidate for home health physical therapy.  Limited hydrocodone for pain control for now.  Recheck back in 1 month.  If not better consider MRI or injection.Marland Kitchen  PDMP reviewed during this encounter. Orders Placed This Encounter  Procedures  . Ambulatory referral to Home Health    Referral Priority:   Routine    Referral Type:   [place]  Referral Reason:   Specialty Services Required    Requested Specialty:   [place]    Number of Visits Requested:   1   Meds ordered this encounter  Medications  . DISCONTD: HYDROcodone-acetaminophen (NORCO/VICODIN) 5-325 MG tablet    Sig: Take 1  tablet by mouth every 6 (six) hours as needed.    Dispense:  15 tablet    Refill:  0  . HYDROcodone-acetaminophen (NORCO/VICODIN) 5-325 MG tablet    Sig: Take 1 tablet by mouth every 6 (six) hours as needed.    Dispense:  15 tablet    Refill:  0    Discussed warning signs or symptoms. Please see discharge instructions. Patient expresses understanding.   The above documentation has been reviewed and is accurate and complete Lynne Leader, M.D.

## 2021-01-04 NOTE — Patient Instructions (Signed)
Thank you for coming in today.  I've referred you to Physical Therapy.  Let us know if you don't hear from them in one week.  Recheck with me in 1 month.   If not improved I can do injection and we can MRI.    Hip Bursitis Rehab Ask your health care provider which exercises are safe for you. Do exercises exactly as told by your health care provider and adjust them as directed. It is normal to feel mild stretching, pulling, tightness, or discomfort as you do these exercises. Stop right away if you feel sudden pain or your pain gets worse. Do not begin these exercises until told by your health care provider. Stretching exercise This exercise warms up your muscles and joints and improves the movement and flexibility of your hip. This exercise also helps to relieve pain and stiffness. Iliotibial band stretch An iliotibial band is a strong band of muscle tissue that runs from the outer side of your hip to the outer side of your thigh and knee. 1. Lie on your side with your left / right leg in the top position. 2. Bend your left / right knee and grab your ankle. Stretch out your bottom arm to help you balance. 3. Slowly bring your knee back so your thigh is behind your body. 4. Slowly lower your knee toward the floor until you feel a gentle stretch on the outside of your left / right thigh. If you do not feel a stretch and your knee will not fall farther, place the heel of your other foot on top of your knee and pull your knee down toward the floor with your foot. 5. Hold this position for __________ seconds. 6. Slowly return to the starting position. Repeat __________ times. Complete this exercise __________ times a day.   Strengthening exercises These exercises build strength and endurance in your hip and pelvis. Endurance is the ability to use your muscles for a long time, even after they get tired. Bridge This exercise strengthens the muscles that move your thigh backward (hip  extensors). 1. Lie on your back on a firm surface with your knees bent and your feet flat on the floor. 2. Tighten your buttocks muscles and lift your buttocks off the floor until your trunk is level with your thighs. ? Do not arch your back. ? You should feel the muscles working in your buttocks and the back of your thighs. If you do not feel these muscles, slide your feet 1-2 inches (2.5-5 cm) farther away from your buttocks. ? If this exercise is too easy, try doing it with your arms crossed over your chest. 3. Hold this position for __________ seconds. 4. Slowly lower your hips to the starting position. 5. Let your muscles relax completely after each repetition. Repeat __________ times. Complete this exercise __________ times a day.   Squats This exercise strengthens the muscles in front of your thigh and knee (quadriceps). 1. Stand in front of a table, with your feet and knees pointing straight ahead. You may rest your hands on the table for balance but not for support. 2. Slowly bend your knees and lower your hips like you are going to sit in a chair. ? Keep your weight over your heels, not over your toes. ? Keep your lower legs upright so they are parallel with the table legs. ? Do not let your hips go lower than your knees. ? Do not bend lower than told by your health care provider. ?  If your hip pain increases, do not bend as low. 3. Hold the squat position for __________ seconds. 4. Slowly push with your legs to return to standing. Do not use your hands to pull yourself to standing. Repeat __________ times. Complete this exercise __________ times a day. Hip hike 1. Stand sideways on a bottom step. Stand on your left / right leg with your other foot unsupported next to the step. You can hold on to the railing or wall for balance if needed. 2. Keep your knees straight and your torso square. Then lift your left / right hip up toward the ceiling. 3. Hold this position for __________  seconds. 4. Slowly let your left / right hip lower toward the floor, past the starting position. Your foot should get closer to the floor. Do not lean or bend your knees. Repeat __________ times. Complete this exercise __________ times a day. Single leg stand 1. Without shoes, stand near a railing or in a doorway. You may hold on to the railing or door frame as needed for balance. 2. Squeeze your left / right buttock muscles, then lift up your other foot. ? Do not let your left / right hip push out to the side. ? It is helpful to stand in front of a mirror for this exercise so you can watch your hip. 3. Hold this position for __________ seconds. Repeat __________ times. Complete this exercise __________ times a day. This information is not intended to replace advice given to you by your health care provider. Make sure you discuss any questions you have with your health care provider. Document Revised: 12/15/2018 Document Reviewed: 12/15/2018 Elsevier Patient Education  [place].

## 2021-01-05 ENCOUNTER — Telehealth: Payer: Self-pay | Admitting: Family Medicine

## 2021-01-05 MED ORDER — HYDROCODONE-ACETAMINOPHEN 5-325 MG PO TABS: 1.0000 | ORAL_TABLET | Freq: Four times a day (QID) | ORAL | 0 refills | Status: DC | PRN

## 2021-01-05 NOTE — Addendum Note (Signed)
Addended by: Gregor Hams on: 01/05/2021 09:26 AM   Modules accepted: Orders

## 2021-01-05 NOTE — Telephone Encounter (Signed)
We kept having problems with the hydrocodone prescription.  Eventually we learned that [facility] does not accept electronic prescribing for controlled substances.  I have sent the prescription [facility] where should be available now.

## 2021-01-05 NOTE — Telephone Encounter (Signed)
Called pt and informed her of change of location for her rx to [facility].  Pt verbalizes understanding.

## 2021-01-11 ENCOUNTER — Ambulatory Visit: Payer: PPO | Admitting: Physician Assistant

## 2021-01-11 NOTE — Progress Notes (Deleted)
Cardiology Office Note:    Date:  01/11/2021   ID:  [name] [name], DOB 14-Feb-1941, MRN [id number]  PCP:  [name] Lima, MD   [place] Providers Cardiologist:  [name] Mocha, MD { Click to update primary MD,subspecialty MD or APP then REFRESH:1}    Referring MD: [name] Lima, MD   Chief Complaint:  No chief complaint on file.    Patient Profile:    [name] [name] is a 80 y.o. female with:   Coronary artery disease  ? S/p Inf MI in 2007 >> PCI: DES to RCA ? S/p POBA to [facility] and DES to dRCA in 07/2017 ([facility] in [place], [place])  Mid LAD mod dz >> neg by FFR ? Myoview 11/2019: no ischemia  ? Cath 7/21: pRCA 95 >> PCI: DES   Bacterial endocarditis  ? Admitted to [facility] in [place], [place]  Rx with IV antibiotics x 3 weeks ? Echocardiogram 10/2018: EF 55-60, AoV calcification (?Lambl's Excrescence) ? 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  Hypertension   Amlodipine held due to low BP in 9/21  Hyperlipidemia   Diabetes mellitus   Prior CV studies: Cardiac catheterization Mar 15, 2020 RCA proximal 95, mid stents patent with 10 ISR LCx proximal 30 LAD proximal 50, mid 30 PCI: 2.75 x 18 mm Resolute Onyx DES to the proximal RCA    Myoview 11/12/2019 EF > 65, normal perfusion (no ischemia or infarction)  Echocardiogram 05/01/2019 EF 60-65, Gr 2 DD, normal RVSF, mild LAE, mod MAC, trivial MR, mod AoV sclerosis with mobile structure representing Lambl's excrescence   Echocardiogram 10/31/2018 EF 55-60, mod MAC, mod TR, calcification of AoV, mild AI, mobile area of calcium in LVOT (small veg vs Lambl's excrescence)  Event Monitor 08/31/18 NSR, Avg HR 72; 1 six beat NSVT, no AFib  TEE 09/26/17 ([name] [name]) Mild LVH, normal LVSF, no RWMA, mild LAE, mobile structure on AoV extends into LVOT c/w SBE, mild AI, mod TR  Carotid US 09/24/17 ([place]) Conclusions: Mild plaque (<50%) is present in the bilateral  internal carotid arteries that is not associated with a hemodynamically significant stenosis.  Antegrade flow with a normal hemodynamic profile was present in both vertebral arteries.  Echo ([place]) 09/24/17 Mild conc LVH, normal LVSF, no RWMA, mild LAE, mobile linear structure in LVOT side of AoV (Lambl's excrescence vs vegetation), mod TR, mild pulmonary HTN, mild PI  Cardiac Catheterization11/12/18 [facility] in [place], [place]) [place] dist 20 LAD prox 50 (FFR 0.94 - no hemodynamically significant) LCx prox 30; OM1 prox 60 RCA prox 20, mid stent patent with 50 ISR, dist 80 at edge of prior stent PCI: POBA to mid RCA ISR PCI: 2.5 x 12 mm Resolute DES to dist RCA overlapping with prior stent  Echo 07/15/17 [facility] in [place], [place]) Borderline conc LVH, EF 55-60, Gr 1 DD, mild LAE, trivial MR, mild AoV sclerosis, trivial AI, mild to mod TR, RVSP 40-45 (mild pulmo HTN)  Myoview 03/05/12 Overall Impression: Normal stress nuclear study. LV Ejection Fraction: 69%. LV Wall Motion: NL LV Function; NL Wall Motion.  History of Present Illness: [name] [name] was last seen by Dr. Burt [name] in 11/21.  She returns for follow-up.  ***        Past Medical History:  Diagnosis Date  . Allergy   . Anxiety   . CAD (coronary artery disease)    s/p inf MI 2007 - tx w/ DES to RCA // Echo 12/08: EF 60%, mild MR,  mild LAE // [place] 08/2009: dLM 20%, LAD beyond diagonal 60-70%, stable from prior catheterization in 2007, ostial circumflex 40-50%, mid RCA stent ok, EF 65% // Myoview 08/2009: EF 76%, small inferoapical fixed defect, no ischemia // Myoview 7/13: no scar or ischemia, EF 69% //    . Cataract    removed both eyes  . Cerebrovascular disease, unspecified   . Chronic kidney disease    frequent Kidney Infections  . Chronic neck pain   . Chronic pain syndrome   . Diverticular disease   . Dizziness   . DJD (degenerative joint disease)   . DM type 2 (diabetes mellitus, type 2)  ([place])   . Echocardiogram    Echo 10/2018: EF 55-60, normal RVSF, mod MAC, mod TR, severe AoV calcification and sclerosis with nodular calcium/mobile area of calcium in the LVOT (small veg vs Lambl's excrescence  - consider TEE), mild AI, mild AS (mean 11).   . Echocardiogram 04/2019   Echocardiogram 04/2019: EF 60-65, basal septal hypertrophy, grade 2 diastolic dysfunction, normal wall motion, normal RV SF, mild LAE, mod MAC, trivial MR, mod sclerosis of the aortic valve with mod aortic annular calcification, thin mobile filamentous structure on ventricular side of AV likely representing Lambl's excrescence, mild AI, mild TR  . GERD (gastroesophageal reflux disease)   . H/O hiatal hernia   . HTN (hypertension)   . Hx of fall 10/2020  . IBS (irritable bowel syndrome)   . 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 Internal hemorrhoids   . Ischemic colitis ([place])   . Mitral valve prolapse   . Mixed hyperlipidemia   . Myocardial infarction ([place]) 2007  . Neuromuscular disorder ([place])    hiatal hernia  . Nocturia   . Pancreatitis    1955 an once more  . PONV (postoperative nausea and vomiting)    Difficluty opening mouth wide and turning head. (Cervical Fusion)  . Premature ventricular contractions   . Tubular adenoma of colon   . Ulcer    [facility]  . Urinary incontinence     Current Medications: No outpatient medications have been marked as taking for the 01/11/21 encounter (Appointment) with [name] Dopp T, PA-C.     Allergies:   Nitrofurantoin, Niacin and related, Ceftriaxone, Codeine, Prednisone, Sulfonamide derivatives, and Tetracycline   Social History   Tobacco Use  . Smoking status: Never Smoker  . Smokeless tobacco: Never Used  Vaping Use  . Vaping Use: Never used  Substance Use Topics  . Alcohol use: No  . Drug use: No     Family Hx: The patient's family history includes Cancer - Other in her brother; Colon polyps in her sister; Coronary artery  disease in some other family members; Heart disease in her mother; Lung cancer in her brother; Pancreatic cancer in her mother. There is no history of Colon cancer, Esophageal cancer, Rectal cancer, or Stomach cancer.  ROS   EKGs/Labs/Other Test Reviewed:    EKG:  EKG is *** ordered today.  The ekg ordered today demonstrates ***  Recent Labs: 09/12/2020: TSH 1.54 11/29/2020: ALT 20; Hemoglobin 13.9; Platelets 245.0 12/22/2020: BUN 17; Creatinine, Ser 0.86; Potassium 5.2 No hemolysis seen; Sodium 138   Recent Lipid Panel Lab Results  Component Value Date/Time   CHOL 112 09/12/2020 03:16 PM   TRIG 244.0 (H) 09/12/2020 03:16 PM   TRIG 164 (H) 06/24/2006 07:38 AM   HDL 48.80 09/12/2020 03:16 PM   CHOLHDL 2 09/12/2020 03:16 PM  [place] 26 04/30/2019 11:34 AM   LDLDIRECT 42.0 09/12/2020 03:16 PM      Risk Assessment/Calculations:   {Does this patient have ATRIAL FIBRILLATION?:[id number]}  Physical Exam:    VS:  There were no vitals taken for this visit.    Wt Readings from Last 3 Encounters:  01/04/21 128 lb (58.1 kg)  12/22/20 130 lb (59 kg)  12/01/20 129 lb 3.2 oz (58.6 kg)     Physical Exam ***     ASSESSMENT & PLAN:    *** 1. Coronary artery disease involving native coronary artery of native heart with angina pectoris (HCC) Hx of inferior MI in 2007 tx with DES to the RCA. She underwent POBA to the mid RCA stent and DES to the distal RCA (overlapping with the prior stent) in 07/2017 in [place].  Myoview in 11/2019 was low risk.  However, she developed progressive anginal symptoms and a recent cardiac catheterization demonstrated high grade stenosis in the RCA prior to the mid stents.  This was tx with a DES.  She is doing well without recurrent angina.  She is having some atypical chest pain that seems to be improving. Question if this is MSK.  Her ECG is normal.  She knows to contact us if her symptoms persist or worsen.  Continue aspirin, clopidogrel, atorvastatin,  ezetimibe, isosorbide, metoprolol, amlodipine.  Follow-up in 3 months.  2. Essential hypertension The patient's blood pressure is controlled on her current regimen.  Continue current therapy.   3. Hyperlipidemia LDL goal <70 LDL optimal on most recent lab work.  Continue current Rx.   {Are you ordering a CV Procedure (e.g. stress test, cath, DCCV, TEE, etc)?   Press F2        :[id number]}    Dispo:  No follow-ups on file.   Medication Adjustments/Labs and Tests Ordered: Current medicines are reviewed at length with the patient today.  Concerns regarding medicines are outlined above.  Tests Ordered: No orders of the defined types were placed in this encounter.  Medication Changes: No orders of the defined types were placed in this encounter.   Signed, [name] Dopp, PA-C  01/11/2021 8:07 AM    [facility] [address], [place], [place]  [address] Phone: [phone]; Fax: [phone]

## 2021-01-13 ENCOUNTER — Other Ambulatory Visit: Payer: Self-pay | Admitting: Internal Medicine

## 2021-01-13 ENCOUNTER — Telehealth: Payer: Self-pay | Admitting: Cardiovascular Disease

## 2021-01-13 DIAGNOSIS — I10 Essential (primary) hypertension: Secondary | ICD-10-CM

## 2021-01-13 DIAGNOSIS — M542 Cervicalgia: Secondary | ICD-10-CM

## 2021-01-13 DIAGNOSIS — I25119 Atherosclerotic heart disease of native coronary artery with unspecified angina pectoris: Secondary | ICD-10-CM

## 2021-01-13 MED ORDER — VENLAFAXINE HCL ER 37.5 MG PO CP24: 37.5000 mg | ORAL_CAPSULE | Freq: Every morning | ORAL | 0 refills | Status: DC

## 2021-01-13 MED ORDER — CLOPIDOGREL BISULFATE 75 MG PO TABS: 1.0000 | ORAL_TABLET | Freq: Every morning | ORAL | 0 refills | Status: DC

## 2021-01-13 MED ORDER — METOPROLOL SUCCINATE ER 50 MG PO TB24: 50.0000 mg | ORAL_TABLET | Freq: Every day | ORAL | 3 refills | Status: DC

## 2021-01-13 MED ORDER — GABAPENTIN 300 MG PO CAPS: 300.0000 mg | ORAL_CAPSULE | Freq: Two times a day (BID) | ORAL | 1 refills | Status: DC

## 2021-01-13 MED ORDER — ATORVASTATIN CALCIUM 80 MG PO TABS: ORAL_TABLET | ORAL | 0 refills | Status: DC

## 2021-01-13 MED ORDER — EZETIMIBE 10 MG PO TABS: 10.0000 mg | ORAL_TABLET | Freq: Every morning | ORAL | 0 refills | Status: DC

## 2021-01-13 MED ORDER — DICYCLOMINE HCL 10 MG PO CAPS: ORAL_CAPSULE | ORAL | 0 refills | Status: DC

## 2021-01-13 MED ORDER — VITAMIN D (ERGOCALCIFEROL) 1.25 MG (50000 UNIT) PO CAPS: 50000.0000 [IU] | ORAL_CAPSULE | ORAL | 0 refills | Status: DC

## 2021-01-13 MED ORDER — DEXLANSOPRAZOLE 60 MG PO CPDR: 60.0000 mg | DELAYED_RELEASE_CAPSULE | Freq: Every morning | ORAL | 0 refills | Status: DC

## 2021-01-13 NOTE — Telephone Encounter (Signed)
Patient called requesting 90 day supply refill for all meds to [facility]. She is leaving for vacation tomorrow and needs them today.

## 2021-01-13 NOTE — Addendum Note (Signed)
Addended by: Charlton Haws on: 01/13/2021 12:08 PM   Modules accepted: Orders

## 2021-01-13 NOTE — Telephone Encounter (Signed)
*  STAT* If patient is at the pharmacy, call can be transferred to refill team.   1. Which medications need to be refilled? (please list name of each medication and dose if known) metoprolol succinate (TOPROL-XL) 50 MG 24 hr tablet  2. Which pharmacy/location (including street and city if local pharmacy) is medication to be sent to? [facility] - [place], [place] - [place] [address]  3. Do they need a 30 day or 90 day supply? [place]

## 2021-01-13 NOTE — Addendum Note (Signed)
Addended by: Charlton Haws on: 01/13/2021 10:56 AM   Modules accepted: Orders

## 2021-01-19 ENCOUNTER — Encounter: Payer: Self-pay | Admitting: Internal Medicine

## 2021-01-19 ENCOUNTER — Other Ambulatory Visit: Payer: Self-pay

## 2021-01-19 ENCOUNTER — Telehealth (INDEPENDENT_AMBULATORY_CARE_PROVIDER_SITE_OTHER): Payer: PPO | Admitting: Internal Medicine

## 2021-01-19 DIAGNOSIS — J069 Acute upper respiratory infection, unspecified: Secondary | ICD-10-CM | POA: Diagnosis not present

## 2021-01-19 MED ORDER — LEVOFLOXACIN 500 MG PO TABS: 500.0000 mg | ORAL_TABLET | Freq: Every day | ORAL | 0 refills | Status: DC

## 2021-01-19 MED ORDER — BENZONATATE 100 MG PO CAPS: 100.0000 mg | ORAL_CAPSULE | Freq: Three times a day (TID) | ORAL | 0 refills | Status: DC | PRN

## 2021-01-19 NOTE — Progress Notes (Signed)
Virtual Visit via Video Note  I connected with [name] [name] on 01/19/21 at 10:00 AM EDT by a video enabled telemedicine application and verified that I am speaking with the correct person using two identifiers.   I discussed the limitations of evaluation and management by telemedicine and the availability of in person appointments. The patient expressed understanding and agreed to proceed.  I was located at our [facility] office. The patient was at [place] home in [place]. Dtr Pam (RN) is present in the visit.   History of Present Illness:   C/o cough productive for purulent sputum of 2 weeks duration.  There is congestion present.  Over-the-counter medicines do not work.  Radiation started to blow her nose with bloody mucus discharge.  There is no chest pain.  There is a mild shortness of breath.  No abdominal pain, diarrhea, constipation, skin rashes.  The patient's COVID test was negative   Observations/Objective: The patient appears to be in no acute distress, not dyspneic.  Assessment and Plan:  See my Assessment and Plan. Follow Up Instructions:    I discussed the assessment and treatment plan with the patient. The patient was provided an opportunity to ask questions and all were answered. The patient agreed with the plan and demonstrated an understanding of the instructions.   The patient was advised to call back or seek an in-person evaluation if the symptoms worsen or if the condition fails to improve as anticipated.  I provided face-to-face time during this encounter. We were at different locations.   Walker Kehr, MD

## 2021-01-19 NOTE — Assessment & Plan Note (Signed)
Probable bronchitis/sinusitis.  Prescribed Levaquin for 7 days and Tessalon as needed.  Antibiotic choice is complicated due to the patient's multiple drug allergies.  She will go to an urgent care for chest x-ray if not better in a few days.

## 2021-01-27 ENCOUNTER — Telehealth: Payer: Self-pay | Admitting: Family Medicine

## 2021-01-27 DIAGNOSIS — M25552 Pain in left hip: Secondary | ICD-10-CM

## 2021-01-27 NOTE — Telephone Encounter (Signed)
Patient called following up on her PT referral. I told her that we have been having issues finding someone that accepts her insurance and coverage has not been great.  She said that she is now in [facility] with her daughter and may be there for a couple months.   She asked if Dr Georgina Snell would be willing to order the MRI for her? She has not had any improvement and is in a lot of pain. She was hoping the MRI might would give some more answers as to what is causing her pain. (They found a place near them that is able to do MRIs - [facility] [address] [place], [place]  Phone # [phone])  Please advise.

## 2021-01-31 NOTE — Telephone Encounter (Signed)
MRI ordered.   I called patient and advised her to get a CD made of the images it when she gets her MRI as well.

## 2021-02-01 ENCOUNTER — Encounter: Payer: Self-pay | Admitting: *Deleted

## 2021-02-01 NOTE — Telephone Encounter (Signed)
Has been faxed. Waiting for confirmation fax before calling patient

## 2021-02-01 NOTE — Telephone Encounter (Signed)
Please send MRI to [facility], [place], [place] Phone# [phone].  Patient would like a call when this has been sent to them [phone]).

## 2021-02-03 ENCOUNTER — Ambulatory Visit: Payer: PPO | Admitting: Family Medicine

## 2021-02-06 ENCOUNTER — Encounter: Payer: Self-pay | Admitting: Family Medicine

## 2021-02-06 DIAGNOSIS — M7060 Trochanteric bursitis, unspecified hip: Secondary | ICD-10-CM | POA: Diagnosis not present

## 2021-02-06 DIAGNOSIS — M25552 Pain in left hip: Secondary | ICD-10-CM | POA: Diagnosis not present

## 2021-02-07 ENCOUNTER — Telehealth: Payer: Self-pay | Admitting: Family Medicine

## 2021-02-07 DIAGNOSIS — M7060 Trochanteric bursitis, unspecified hip: Secondary | ICD-10-CM

## 2021-02-07 NOTE — Telephone Encounter (Signed)
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  You have a little bit of arthritis but the main finding is the bursitis.  This should be treated with physical therapy.  My recommendation is that you identify a physical therapy location in [place] that you would like to go to and let me know which one it is and I will place a referral to that location.  When you return to [facility] please bring with you a copy of the images on the CD that you were provided with during the MRI.  If you do not have 1 please contact your imaging location and asked them to make you a CD of the images.  I will see you when you get back.  Please schedule a follow-up appointment when you return to [place].

## 2021-02-08 NOTE — Telephone Encounter (Signed)
Called pt and relayed info per Dr. Clovis Riley phone note.  Pt verbalizes understanding and will call us back once she determines where she'd like to go to PT.

## 2021-02-08 NOTE — Telephone Encounter (Signed)
[facility]   Phone: [phone] Fax: [phone]

## 2021-02-09 NOTE — Telephone Encounter (Signed)
Physical therapy ordered

## 2021-02-15 ENCOUNTER — Telehealth: Payer: Self-pay | Admitting: Pharmacist

## 2021-02-15 ENCOUNTER — Telehealth: Payer: PPO

## 2021-02-15 NOTE — Progress Notes (Deleted)
Chronic Care Management Pharmacy Note  02/15/2021 Name:  [name] [name] MRN:  [id number] DOB:  [birth year]  Summary:   Recommendations/Changes made from today's visit:   Plan:    Subjective: [name] [name] is an 80 y.o. year old female who is a primary patient of Janith Lima, MD.  The CCM team was consulted for assistance with disease management and care coordination needs.    {CCMTELEPHONEFACETOFACE:21091510} for {CCMINITIALFOLLOWUPCHOICE:21091511} in response to provider referral for pharmacy case management and/or care coordination services.   Consent to Services:  {CCMCONSENTOPTIONS:25074}  Patient Care Team: Janith Lima, MD as PCP - General (Internal Medicine) Sherren Mocha, MD as PCP - Cardiology (Cardiology) Sherren Mocha, MD as Consulting Physician (Cardiology) Tat, Eustace Quail, DO as Consulting Physician (Neurology) Marchia Bond, MD as Consulting Physician (Orthopedic Surgery) Charlton Haws, [facility] (Pharmacist)  Recent office visits: 01/19/21 Dr Alain Marion OV: URI. Rx'd levaquin, benzonatate.  12/22/20 Dr Ronnald Ramp OV: chronic f/u; worsening chronic hip pain. Rx'd tramadol. Referred to sports med.  Recent consult visits: 01/04/21 Dr Georgina Snell (sports med): acute hip pain. Due to tendon injury/bursitis. Plan for home PT. Rx'd Norco #15.  12/01/20 Dr Hilarie Fredrickson (GI): f/u rectal bleeding. Rx'd diltiazem cream.  Hospital visits: None in previous 6 months   Objective:  Lab Results  Component Value Date   CREATININE 0.86 12/22/2020   BUN 17 12/22/2020   GFR 63.48 12/22/2020   GFRNONAA 61 05/17/2020   GFRAA 71 05/17/2020   NA 138 12/22/2020   K 5.2 No hemolysis seen (H) 12/22/2020   CALCIUM 8.8 12/22/2020   CO2 36 (H) 12/22/2020   GLUCOSE 163 (H) 12/22/2020    Lab Results  Component Value Date/Time   HGBA1C 7.1 (H) 12/22/2020 02:37 PM   HGBA1C 7.2 (H) 09/12/2020 03:16 PM   GFR 63.48 12/22/2020 02:37 PM   GFR 63.51 11/29/2020 10:58 AM   MICROALBUR  1.9 09/12/2020 03:16 PM   MICROALBUR 2.6 (H) 07/15/2018 10:24 AM    Last diabetic Eye exam:  Lab Results  Component Value Date/Time   HMDIABEYEEXA No Retinopathy 04/27/2019 12:00 AM    Last diabetic Foot exam:  Lab Results  Component Value Date/Time   HMDIABFOOTEX done 02/10/2014 12:00 AM     Lab Results  Component Value Date   CHOL 112 09/12/2020   HDL 48.80 09/12/2020   LDLCALC 26 04/30/2019   LDLDIRECT 42.0 09/12/2020   TRIG 244.0 (H) 09/12/2020   CHOLHDL 2 09/12/2020    Hepatic Function Latest Ref Rng & Units 11/29/2020 09/12/2020 09/29/2019  Total Protein 6.0 - 8.3 g/dL 6.6 6.5 6.9  Albumin 3.5 - 5.2 g/dL 4.0 4.0 4.1  AST 0 - 37 U/L 18 20 20   ALT 0 - 35 U/L 20 19 18   Alk Phosphatase 39 - 117 U/L 60 60 53  Total Bilirubin 0.2 - 1.2 mg/dL 0.4 0.3 0.3  Bilirubin, Direct 0.0 - 0.3 mg/dL - 0.1 0.1    Lab Results  Component Value Date/Time   TSH 1.54 09/12/2020 03:16 PM   TSH 2.43 07/15/2018 10:24 AM    CBC Latest Ref Rng & Units 11/29/2020 09/12/2020 05/17/2020  WBC 4.0 - 10.5 K/uL 6.3 7.4 10.3  Hemoglobin 12.0 - 15.0 g/dL 13.9 13.3 12.8  Hematocrit 36.0 - 46.0 % 40.8 40.3 39.1  Platelets 150.0 - 400.0 K/uL 245.0 244.0 262    Lab Results  Component Value Date/Time   VD25OH 65.82 07/15/2018 10:24 AM   VD25OH 49 06/15/2013 03:22 PM   VD25OH 38  11/10/2008 08:47 PM    Clinical ASCVD: Yes  The ASCVD Risk score Mikey Bussing DC Jr., et al., 2013) failed to calculate for the following reasons:   The 2013 ASCVD risk score is only valid for ages 39 to 75    Depression screen PHQ 2/9 09/12/2020 03/03/2020 04/30/2019  Decreased Interest 0 0 0  Down, Depressed, Hopeless 0 0 0  PHQ - 2 Score 0 0 0  Altered sleeping - - 0  Tired, decreased energy - - 0  Change in appetite - - 0  Feeling bad or failure about yourself  - - 0  Trouble concentrating - - 0  Moving slowly or fidgety/restless - - 0  Suicidal thoughts - - 0  PHQ-9 Score - - 0  Difficult doing work/chores - - Not  difficult at all  Some recent data might be hidden     Social History   Tobacco Use  Smoking Status Never  Smokeless Tobacco Never   BP Readings from Last 3 Encounters:  01/04/21 (!) 162/80  12/22/20 (!) 146/84  12/01/20 (!) 166/82   Pulse Readings from Last 3 Encounters:  01/04/21 90  12/22/20 87  12/01/20 77   Wt Readings from Last 3 Encounters:  01/04/21 128 lb (58.1 kg)  12/22/20 130 lb (59 kg)  12/01/20 129 lb 3.2 oz (58.6 kg)   BMI Readings from Last 3 Encounters:  01/04/21 25.00 kg/m  12/22/20 25.39 kg/m  12/01/20 25.23 kg/m    Assessment/Interventions: Review of patient past medical history, allergies, medications, health status, including review of consultants reports, laboratory and other test data, was performed as part of comprehensive evaluation and provision of chronic care management services.   SDOH:  (Social Determinants of Health) assessments and interventions performed: Yes  SDOH Screenings   Alcohol Screen: Low Risk    Last Alcohol Screening Score (AUDIT): 0  Depression (PHQ2-9): Low Risk    PHQ-2 Score: 0  Financial Resource Strain: Low Risk    Difficulty of Paying Living Expenses: Not hard at all  Food Insecurity: No Food Insecurity   Worried About Charity fundraiser in the Last Year: Never true   Ran Out of Food in the Last Year: Never true  Housing: Low Risk    Last Housing Risk Score: 0  Physical Activity: Sufficiently Active   Days of Exercise per Week: 5 days   Minutes of Exercise per Session: 30 min  Social Connections: Moderately Integrated   Frequency of Communication with Friends and Family: More than three times a week   Frequency of Social Gatherings with Friends and Family: More than three times a week   Attends Religious Services: More than 4 times per year   Active Member of [employer] or Organizations: Yes   Attends Archivist Meetings: More than 4 times per year   Marital Status: Widowed  Stress: No Stress  Concern Present   Feeling of Stress : Not at all  Tobacco Use: Low Risk    Smoking Tobacco Use: Never   Smokeless Tobacco Use: Never  Transportation Needs: No Transportation Needs   Lack of Transportation (Medical): No   Lack of Transportation (Non-Medical): No    CCM Care Plan  Allergies  Allergen Reactions   Nitrofurantoin Nausea Only    Severe nausea   Niacin And Related Other (See Comments)    Must take "Flush-free"   Ceftriaxone Itching   Codeine Nausea Only   Prednisone Itching and Rash   Sulfonamide Derivatives Itching  Tetracycline Itching and Rash    Medications Reviewed Today     Reviewed by Cassandria Anger, MD (Physician) on 01/19/21 at 1022  Med List Status: <None>   Medication Order Taking? Sig Documenting Provider Last Dose Status Informant  Ascorbic Acid (VITAMIN C) 500 MG CAPS [id number] No Take 500 mg by mouth in the morning and at bedtime. [provider] Taking Active Self  aspirin EC 81 MG tablet [id number] No Take 81 mg by mouth every morning.  [provider] Taking Active Self  atorvastatin (LIPITOR) 80 MG tablet [id number]  TAKE ONE TABLET BY MOUTH EVERYDAY AT BEDTIME Janith Lima, MD  Active   benzonatate (TESSALON) 100 MG capsule [id number] Yes Take 1 capsule (100 mg total) by mouth 3 (three) times daily as needed for cough. Plotnikov, Evie Lacks, MD  Active   celecoxib (CELEBREX) 50 MG capsule [id number] No Take 1 capsule (50 mg total) by mouth 2 (two) times daily. Janith Lima, MD Taking Active   Cinnamon 500 MG TABS [id number] No Take 1,000 mg by mouth. Once a day [provider] Taking Active Self  clopidogrel (PLAVIX) 75 MG tablet [id number]  Take 1 tablet (75 mg total) by mouth every morning. Janith Lima, MD  Active   dexlansoprazole (DEXILANT) 60 MG capsule [id number]  Take 1 capsule (60 mg total) by mouth every morning. Janith Lima, MD  Active   dicyclomine (BENTYL) 10 MG capsule [id number]  TAKE ONE TABLET BY  MOUTH FOUR TIMES DAILY Janith Lima, MD  Active   diltiazem 2 % GEL [id number] No Apply to the rectum twice daily x 4 weeks Pyrtle, Lajuan Lines, MD Taking Active   ezetimibe (ZETIA) 10 MG tablet [id number]  Take 1 tablet (10 mg total) by mouth every morning. Janith Lima, MD  Active   gabapentin (NEURONTIN) 300 MG capsule [id number]  Take 1 capsule (300 mg total) by mouth 2 (two) times daily. Janith Lima, MD  Active   HYDROcodone-acetaminophen (NORCO/VICODIN) 5-325 MG tablet [id number]  Take 1 tablet by mouth every 6 (six) hours as needed. Gregor Hams, MD  Active   HYDROcodone-acetaminophen (NORCO/VICODIN) 5-325 MG tablet [id number]  Take 1 tablet by mouth every 6 (six) hours as needed. Gregor Hams, MD  Active   Krill Oil 1000 MG CAPS [id number] No Take 1,000 mg by mouth daily. [provider] Taking Active Self  levofloxacin (LEVAQUIN) 500 MG tablet [id number] Yes Take 1 tablet (500 mg total) by mouth daily. Plotnikov, Evie Lacks, MD  Active   magnesium oxide (MAG-OX) 400 MG tablet [id number] No Take 400 mg by mouth daily. [provider] Taking Active   metoprolol succinate (TOPROL-XL) 50 MG 24 hr tablet [id number]  Take 1 tablet (50 mg total) by mouth daily. Sherren Mocha, MD  Active   Misc Natural Products (FOCUSED MIND PO) [id number] No Take 15 mg by mouth 1 day or 1 dose. [provider] Taking Active Self  nitroGLYCERIN (NITROSTAT) 0.4 MG SL tablet [id number] No Place 1 tablet (0.4 mg total) under the tongue every 5 (five) minutes as needed for chest pain. Janith Lima, MD Taking Active   omega-3 acid ethyl esters (LOVAZA) 1 g capsule [id number] No Take 1 g by mouth 2 (two) times daily. [provider] Taking Active Self  Psyllium (DAILY FIBER PO) [id number] No Take by mouth in the morning and at bedtime. [provider] Taking Active Self  saccharomyces boulardii (FLORASTOR) 250 MG capsule [id number]  No Take 250 mg by mouth 2 (two) times daily.  [provider] Taking Active   TURMERIC PO [id number] No Take 1,000 mg by mouth. One tablet po twice a day [provider] Taking Active Self  venlafaxine XR (EFFEXOR-XR) 37.5 MG 24 hr capsule [id number]  Take 1 capsule (37.5 mg total) by mouth every morning. Janith Lima, MD  Active   Vitamin D, Ergocalciferol, (DRISDOL) 1.25 MG (50000 UNIT) CAPS capsule [id number]  Take 1 capsule (50,000 Units total) by mouth every 7 (seven) days. Janith Lima, MD  Active   vitamin E 1000 UNIT capsule [id number] No Take 1,000 Units by mouth in the morning and at bedtime. [provider] Taking Active             Patient Active Problem List   Diagnosis Date Noted   Upper respiratory infection 01/19/2021   Acute hip pain, left 12/22/2020   Stage 3b chronic kidney disease ([place]) 09/13/2020   Unstable angina (HCC)    History of bacterial endocarditis 10/28/2018   Episodic tension-type headache, not intractable 11/08/2014   Other seasonal allergic rhinitis 07/08/2014   Diabetic peripheral neuropathy ([place]) 06/29/2014   Routine general medical examination at a health care facility 01/20/2014   Other screening mammogram 01/20/2014   Essential hypertension 08/03/2009   CAD (coronary artery disease) 01/14/2008   Controlled diabetes mellitus type 2 with complications ([place]) 51/83/3582   Hyperlipidemia with target LDL less than 100 06/13/2007   Depression with anxiety 06/13/2007   GASTROESOPHAGEAL REFLUX DISEASE 06/13/2007   Osteoarthritis 06/13/2007    Immunization History  Administered Date(s) Administered   Fluad Quad(high Dose 65+) 04/30/2019, 06/27/2020   Influenza Split 07/18/2011, 05/19/2012   Influenza Whole 06/22/2009, 05/15/2010   Influenza, High Dose Seasonal PF 07/20/2015, 05/24/2016, 06/18/2018   Influenza,inj,Quad PF,6+ Mos 06/15/2013, 05/21/2014   Influenza-Unspecified 06/24/2017   Pneumococcal Conjugate-13 01/20/2014   Pneumococcal Polysaccharide-23  11/08/2014, 11/13/2017   Tdap 01/20/2014   Zoster, Live 07/08/2014    Conditions to be addressed/monitored:  Hypertension, Hyperlipidemia, Diabetes, and Coronary Artery Disease  There are no care plans that you recently modified to display for this patient.    Medication Assistance: None required.  Patient affirms current coverage meets needs.  Compliance/Adherence/Medication fill history: Care Gaps: COVID vaccine Shingrix Eye exam (due 04/26/20) Foot exam (due 09/29/20)  Star-Rating Drugs: Atorvastatin - LF 12/02/20 x 90 ds  Patient's preferred pharmacy is:  [facility] [place], [place] - [address] [place] [address] Phone: [phone] Fax: [phone]  [place], [place] [address] Phone: [phone] Fax: [place] [address], [place] [address] Phone: [phone] Fax: [phone]  Uses pill box? Upstream pill packs (90 ds) Pt endorses 100% compliance  We discussed: {Pharmacy options:24294} Patient decided to: {US Pharmacy Plan:23885}  Care Plan and Follow Up Patient Decision:  {FOLLOWUP:24991}  Plan: {CM FOLLOW UP IXVE:55015}       Current Barriers:  {pharmacybarriers:24917}  Pharmacist Clinical Goal(s):  Patient will {PHARMACYGOALCHOICES:24921} through collaboration with PharmD and provider.   Interventions: 1:1 collaboration with Janith Lima, MD regarding development and update of comprehensive plan of care as evidenced by provider attestation and co-signature Inter-disciplinary care team collaboration (see longitudinal plan of care) Comprehensive medication review performed; medication list updated in electronic medical record  Diabetes    A1c goal < 7%  Patient has failed these meds in past: glimepiride,  metformin Patient is currently controlled on the following medications: no medications indicated   We discussed: A1c is well controlled without medications; congratulated patient on good control with diet and exercise.   Plan: Continue control with diet and exercise    Hypertension    BP goal < 140/90 Patient checks BP at home 1-2x per week Patient home BP readings are ranging: SBP 120-130s   Patient has failed these meds in the past: olmesartan (hyperK), isosorbide, amlodipine, lisinopril, losartan Patient is currently controlled on the following medications: metoprolol succinate 50 mg daily   We discussed: BP goals; pt recently stopped amlodipine and reduced dose of isosorbide due to symptomatic hypotension; pt reports BP has been higher and she feels well   Plan: Continue current medications and control with diet and exercise    Hyperlipidemia / CAD    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 Myoview 3/21: no ischemia present. LDL goal < 70  Patient is currently controlled on the following medications: atorvastatin 80 mg daily HS, ezetimibe 10 mg daily, aspirin 81 mg daily, clopidogrel 75 mg daily, nitroglycerin 0.4 mg SL prn, fish oil OTC   We discussed:  Cholesterol goals; benefits of statin for ASCVD risk reduction; importance of DAPT for stent patency; pt has not had to use NTG recently   Plan: Continue current medications and control with diet and exercise    Depression / anxiety   Patient is currently {CHL Controlled/Uncontrolled:[phone]} on the following medications:  Venlafaxine ER 37.5 mg daily  We discussed:  ***  Plan: Continue {CHL [facility] NOBSJ:[id number]}   Patient Goals/Self-Care Activities Patient will:  - {pharmacypatientgoals:24919}  Follow Up Plan: {CM FOLLOW UP MLYY:50354}

## 2021-02-15 NOTE — Telephone Encounter (Signed)
  Chronic Care Management   Outreach Note  02/15/2021 Name: [name] [name] MRN: [id number] DOB: 1941-03-08  Referred by: Janith Lima, MD  Patient had a phone appointment scheduled with clinical pharmacist today.  An unsuccessful telephone outreach was attempted today. The patient was referred to the pharmacist for assistance with medications, care management and care coordination.   Patient will NOT be penalized in any way for missing a CCM appointment. The no-show fee does not apply.  If possible, a message was left to return call to: [phone] or to [facility]: [place], PharmD, Para March, CPP Clinical Pharmacist [facility] [phone]

## 2021-02-16 ENCOUNTER — Encounter: Payer: PPO | Admitting: Internal Medicine

## 2021-02-20 ENCOUNTER — Other Ambulatory Visit: Payer: Self-pay | Admitting: Internal Medicine

## 2021-02-20 DIAGNOSIS — M542 Cervicalgia: Secondary | ICD-10-CM

## 2021-02-21 ENCOUNTER — Ambulatory Visit: Payer: PPO | Admitting: Physician Assistant

## 2021-03-13 ENCOUNTER — Other Ambulatory Visit: Payer: Self-pay

## 2021-03-13 ENCOUNTER — Encounter: Payer: Self-pay | Admitting: Internal Medicine

## 2021-03-13 ENCOUNTER — Ambulatory Visit (INDEPENDENT_AMBULATORY_CARE_PROVIDER_SITE_OTHER): Payer: PPO | Admitting: Internal Medicine

## 2021-03-13 VITALS — BP 130/76 | HR 81 | Temp 98.8°F | Ht 60.0 in | Wt 128.0 lb

## 2021-03-13 DIAGNOSIS — E118 Type 2 diabetes mellitus with unspecified complications: Secondary | ICD-10-CM | POA: Diagnosis not present

## 2021-03-13 DIAGNOSIS — I10 Essential (primary) hypertension: Secondary | ICD-10-CM | POA: Diagnosis not present

## 2021-03-13 NOTE — Patient Instructions (Signed)

## 2021-03-13 NOTE — Progress Notes (Signed)
Subjective:  Patient ID: [name] [name], female    DOB: Oct 18, 1940  Age: 80 y.o. MRN: [id number]  CC: Hypertension, Osteoarthritis, and Diabetes  This visit occurred during the SARS-CoV-2 public health emergency.  Safety protocols were in place, including screening questions prior to the visit, additional usage of staff PPE, and extensive cleaning of exam room while observing appropriate contact time as indicated for disinfecting solutions.    HPI GRACIN SOOHOO presents for f/up -   She complains of hip pain but feels well otherwise.  Outpatient Medications Prior to Visit  Medication Sig Dispense Refill   Ascorbic Acid (VITAMIN C) 500 MG CAPS Take 500 mg by mouth in the morning and at bedtime.     aspirin EC 81 MG tablet Take 81 mg by mouth every morning.      atorvastatin (LIPITOR) 80 MG tablet TAKE ONE TABLET BY MOUTH EVERYDAY AT BEDTIME 90 tablet 0   celecoxib (CELEBREX) 50 MG capsule Take 1 capsule (50 mg total) by mouth 2 (two) times daily. 180 capsule 1   Cinnamon 500 MG TABS Take 1,000 mg by mouth. Once a day     clopidogrel (PLAVIX) 75 MG tablet Take 1 tablet (75 mg total) by mouth every morning. 90 tablet 0   dexlansoprazole (DEXILANT) 60 MG capsule Take 1 capsule (60 mg total) by mouth every morning. 90 capsule 0   dicyclomine (BENTYL) 10 MG capsule TAKE ONE TABLET BY MOUTH FOUR TIMES DAILY 360 capsule 0   ezetimibe (ZETIA) 10 MG tablet Take 1 tablet (10 mg total) by mouth every morning. 90 tablet 0   gabapentin (NEURONTIN) 300 MG capsule TAKE TWO CAPSULES BY MOUTH EVERY MORNING and TAKE TWO CAPSULES BY MOUTH EVERYDAY AT BEDTIME 360 capsule 1   HYDROcodone-acetaminophen (NORCO/VICODIN) 5-325 MG tablet Take 1 tablet by mouth every 6 (six) hours as needed. 15 tablet 0   HYDROcodone-acetaminophen (NORCO/VICODIN) 5-325 MG tablet Take 1 tablet by mouth every 6 (six) hours as needed. 15 tablet 0   Krill Oil 1000 MG CAPS Take 1,000 mg by mouth daily.     levofloxacin (LEVAQUIN) 500  MG tablet Take 1 tablet (500 mg total) by mouth daily. 7 tablet 0   magnesium oxide (MAG-OX) 400 MG tablet Take 400 mg by mouth daily.     metoprolol succinate (TOPROL-XL) 50 MG 24 hr tablet Take 1 tablet (50 mg total) by mouth daily. 90 tablet 3   Misc Natural Products (FOCUSED MIND PO) Take 15 mg by mouth 1 day or 1 dose.     nitroGLYCERIN (NITROSTAT) 0.4 MG SL tablet Place 1 tablet (0.4 mg total) under the tongue every 5 (five) minutes as needed for chest pain. 25 tablet 2   omega-3 acid ethyl esters (LOVAZA) 1 g capsule Take 1 g by mouth 2 (two) times daily.     Psyllium (DAILY FIBER PO) Take by mouth in the morning and at bedtime.     saccharomyces boulardii (FLORASTOR) 250 MG capsule Take 250 mg by mouth 2 (two) times daily.     TURMERIC PO Take 1,000 mg by mouth. One tablet po twice a day     venlafaxine XR (EFFEXOR-XR) 37.5 MG 24 hr capsule Take 1 capsule (37.5 mg total) by mouth every morning. 90 capsule 0   Vitamin D, Ergocalciferol, (DRISDOL) 1.25 MG (50000 UNIT) CAPS capsule Take 1 capsule (50,000 Units total) by mouth every 7 (seven) days. 12 capsule 0   vitamin E 1000 UNIT capsule Take 1,000 Units  by mouth in the morning and at bedtime.     benzonatate (TESSALON) 100 MG capsule Take 1 capsule (100 mg total) by mouth 3 (three) times daily as needed for cough. (Patient not taking: Reported on 03/13/2021) 30 capsule 0   diltiazem 2 % GEL Apply to the rectum twice daily x 4 weeks (Patient not taking: Reported on 03/13/2021) 30 g 0   No facility-administered medications prior to visit.    ROS Review of Systems  Constitutional:  Negative for diaphoresis and fatigue.  HENT: Negative.    Eyes:  Negative for visual disturbance.  Cardiovascular:  Negative for chest pain, palpitations and leg swelling.  Gastrointestinal:  Negative for abdominal pain, diarrhea and nausea.  Endocrine: Negative.   Genitourinary: Negative.  Negative for difficulty urinating.  Musculoskeletal:  Positive for  arthralgias. Negative for myalgias.  Skin: Negative.  Negative for color change.  Allergic/Immunologic: Negative.   Neurological: Negative.  Negative for dizziness, weakness and light-headedness.  Hematological:  Negative for adenopathy. Does not bruise/bleed easily.  Psychiatric/Behavioral: Negative.     Objective:  BP 130/76 (BP Location: Left Arm, Patient Position: Sitting, Cuff Size: Normal)   Pulse 81   Temp 98.8 F (37.1 C) (Oral)   Ht 5' (1.524 m)   Wt 128 lb (58.1 kg)   SpO2 95%   BMI 25.00 kg/m   BP Readings from Last 3 Encounters:  03/13/21 130/76  01/04/21 (!) 162/80  12/22/20 (!) 146/84    Wt Readings from Last 3 Encounters:  03/13/21 128 lb (58.1 kg)  01/04/21 128 lb (58.1 kg)  12/22/20 130 lb (59 kg)    Physical Exam Vitals reviewed.  HENT:     Nose: Nose normal.     Mouth/Throat:     Pharynx: Oropharynx is clear.  Eyes:     Conjunctiva/sclera: Conjunctivae normal.  Cardiovascular:     Rate and Rhythm: Normal rate and regular rhythm.     Heart sounds: No murmur heard. Pulmonary:     [place]: Pulmonary effort is normal.     Breath sounds: No stridor. No wheezing, rhonchi or rales.  Abdominal:     General: Abdomen is flat.     Palpations: There is no mass.     Tenderness: There is no abdominal tenderness. There is no guarding.  Musculoskeletal:        General: Normal range of motion.     Cervical back: Neck supple.     Right lower leg: No edema.     Left lower leg: No edema.  Lymphadenopathy:     Cervical: No cervical adenopathy.  Skin:    General: Skin is warm and dry.  Neurological:     General: No focal deficit present.     Mental Status: She is alert.  Psychiatric:        Mood and Affect: Mood normal.        Behavior: Behavior normal.    Lab Results  Component Value Date   WBC 6.3 11/29/2020   HGB 13.9 11/29/2020   HCT 40.8 11/29/2020   PLT 245.0 11/29/2020   GLUCOSE 163 (H) 12/22/2020   CHOL 112 09/12/2020   TRIG 244.0 (H)  09/12/2020   HDL 48.80 09/12/2020   LDLDIRECT 42.0 09/12/2020   LDLCALC 26 04/30/2019   ALT 20 11/29/2020   AST 18 11/29/2020   NA 138 12/22/2020   K 5.2 No hemolysis seen (H) 12/22/2020   CL 99 12/22/2020   CREATININE 0.86 12/22/2020   BUN 17 12/22/2020  CO2 36 (H) 12/22/2020   TSH 1.54 09/12/2020   INR 1.09 01/23/2018   HGBA1C 7.1 (H) 12/22/2020   MICROALBUR 1.9 09/12/2020    No results found.  Assessment & Plan:   Amandeep was seen today for hypertension, osteoarthritis and diabetes.  Diagnoses and all orders for this visit:  Controlled type 2 diabetes mellitus with complication, without long-term current use of insulin ([place])- Her blood sugars are well controlled. -     HM Diabetes Foot Exam  Essential hypertension- Her BP is well controlled.  I am having Ramiah F. Carino "FAYE" maintain her Vitamin C, aspirin EC, Krill Oil, nitroGLYCERIN, Cinnamon, TURMERIC PO, magnesium oxide, vitamin E, saccharomyces boulardii, Psyllium (DAILY FIBER PO), omega-3 acid ethyl esters, Misc Natural Products (FOCUSED MIND PO), diltiazem, celecoxib, HYDROcodone-acetaminophen, HYDROcodone-acetaminophen, atorvastatin, clopidogrel, dexlansoprazole, dicyclomine, ezetimibe, Vitamin D (Ergocalciferol), venlafaxine XR, metoprolol succinate, benzonatate, levofloxacin, and gabapentin.  No orders of the defined types were placed in this encounter.    Follow-up: Return in about 3 months (around 06/13/2021).  [name], MD

## 2021-03-21 ENCOUNTER — Ambulatory Visit: Payer: PPO | Admitting: Family Medicine

## 2021-03-21 ENCOUNTER — Other Ambulatory Visit: Payer: Self-pay

## 2021-03-21 ENCOUNTER — Encounter: Payer: Self-pay | Admitting: Family Medicine

## 2021-03-21 VITALS — BP 140/80 | HR 81 | Ht 60.0 in | Wt 127.0 lb

## 2021-03-21 DIAGNOSIS — M7060 Trochanteric bursitis, unspecified hip: Secondary | ICD-10-CM

## 2021-03-21 NOTE — Progress Notes (Signed)
   Rito Ehrlich, am serving as a Education administrator for Dr. Lynne Leader.   [name] [name] is a 80 y.o. female who presents to [place] at [facility] today for f/u L hip pain that worsened around 12/20/20 from a suspected twisting mechanism. Of note, pt suffered a fall three years ago on the L hip and had a hairline fracture. Pt was last seen by Dr. Georgina Snell on 01/04/21 and was prescribed limited hydrocodone and was referred to [facility] (in [place]). Today, pt reports that she is feeling much better and is able to walk a lot better.   Dx imaging: 12/22/20 L hip XR             01/29/18 L femur CT             01/23/18 L femur & L hip XR  Pertinent review of systems: No fevers or chills  Relevant historical information: Hypertension.  Diabetes.   Exam:  BP 140/80 (BP Location: Left Arm, Patient Position: Sitting, Cuff Size: Normal)   Pulse 81   Ht 5' (1.524 m)   Wt 127 lb (57.6 kg)   SpO2 97%   BMI 24.80 kg/m  General: Well Developed, well nourished, and in no acute distress.   MSK: Hips normal-appearing normal motion.  Normal gait.    Lab and Radiology Results  Hip MRI obtained February 06, 2021 images on CD independently interpreted.  Additionally corresponding to report showing bilateral trochanteric bursitis. Please see scanned document in imaging tab.    Assessment and Plan: 80 y.o. female with bilateral hip pain due to trochanteric bursitis.  Significant improvement with physical therapy.  Plan to continue home exercise program and recheck back as needed. Discussed treatment plan and options and backup plan going forward. Total encounter time 20 minutes including face-to-face time with the patient and, reviewing past medical record, and charting on the date of service.      Discussed warning signs or symptoms. Please see discharge instructions. Patient expresses understanding.   The above documentation has been reviewed and is accurate and complete Lynne Leader, M.D.

## 2021-03-21 NOTE — Patient Instructions (Signed)
Thank you for coming in today.   Continue home exercises.   Recheck as needed.   I could do injections but I really think exercises are the way to go.

## 2021-03-22 ENCOUNTER — Telehealth: Payer: Self-pay | Admitting: Pharmacist

## 2021-03-22 NOTE — Progress Notes (Signed)
Chronic Care Management Pharmacy Assistant   Name: AVEEN STANSEL  MRN: [id number] DOB: 03-18-1941   Reason for Encounter: Disease State   Conditions to be addressed/monitored: General    Recent office visits:  12/22/20 Dr. [name] Internal MedicineControlled type 2 diabetes mellitus with complication, without long-term current use of insulin (HCC)  Medication changes- tramadol 50 mg prn  Orders placed BPM and Hemoglobin A1c Follow up 6 months  03/13/21 Dr. Marcello Moores JonesControlled type 2 diabetes mellitus with complication, without long-term current use of insulin [facility]) Essential hypertension   Recent consult visits:  01/04/21 Dr. Sherene Sires Sports Medicine (Acute hip pain, left) Ambulatory referral to Home Health Medication Changes Hydrocodone 5-325 mg 1 tab every 6 hours  01/19/21 Dr. Lew Dawes Internal Medicine (Upper respiratory tract infection, unspecified type) Medication Changes Benzonatate 100 mg 3x daily prn, levofloxacin 500 mg daily  03/21/21 Dr. Georgina Snell EvansTrochanteric bursitis, unspecified laterality  Hospital visits:  None in previous 6 months  Medications: Outpatient Encounter Medications as of 03/22/2021  Medication Sig   Ascorbic Acid (VITAMIN C) 500 MG CAPS Take 500 mg by mouth in the morning and at bedtime.   aspirin EC 81 MG tablet Take 81 mg by mouth every morning.    atorvastatin (LIPITOR) 80 MG tablet TAKE ONE TABLET BY MOUTH EVERYDAY AT BEDTIME   benzonatate (TESSALON) 100 MG capsule Take 1 capsule (100 mg total) by mouth 3 (three) times daily as needed for cough. (Patient not taking: Reported on 03/21/2021)   celecoxib (CELEBREX) 50 MG capsule Take 1 capsule (50 mg total) by mouth 2 (two) times daily.   Cinnamon 500 MG TABS Take 1,000 mg by mouth. Once a day   clopidogrel (PLAVIX) 75 MG tablet Take 1 tablet (75 mg total) by mouth every morning.   dexlansoprazole (DEXILANT) 60 MG capsule Take 1 capsule (60 mg total) by mouth every  morning.   dicyclomine (BENTYL) 10 MG capsule TAKE ONE TABLET BY MOUTH FOUR TIMES DAILY   diltiazem 2 % GEL Apply to the rectum twice daily x 4 weeks   ezetimibe (ZETIA) 10 MG tablet Take 1 tablet (10 mg total) by mouth every morning.   gabapentin (NEURONTIN) 300 MG capsule TAKE TWO CAPSULES BY MOUTH EVERY MORNING and TAKE TWO CAPSULES BY MOUTH EVERYDAY AT BEDTIME   HYDROcodone-acetaminophen (NORCO/VICODIN) 5-325 MG tablet Take 1 tablet by mouth every 6 (six) hours as needed.   HYDROcodone-acetaminophen (NORCO/VICODIN) 5-325 MG tablet Take 1 tablet by mouth every 6 (six) hours as needed.   Krill Oil 1000 MG CAPS Take 1,000 mg by mouth daily.   levofloxacin (LEVAQUIN) 500 MG tablet Take 1 tablet (500 mg total) by mouth daily.   magnesium oxide (MAG-OX) 400 MG tablet Take 400 mg by mouth daily.   metoprolol succinate (TOPROL-XL) 50 MG 24 hr tablet Take 1 tablet (50 mg total) by mouth daily.   Misc Natural Products (FOCUSED MIND PO) Take 15 mg by mouth 1 day or 1 dose.   nitroGLYCERIN (NITROSTAT) 0.4 MG SL tablet Place 1 tablet (0.4 mg total) under the tongue every 5 (five) minutes as needed for chest pain.   omega-3 acid ethyl esters (LOVAZA) 1 g capsule Take 1 g by mouth 2 (two) times daily.   Psyllium (DAILY FIBER PO) Take by mouth in the morning and at bedtime.   saccharomyces boulardii (FLORASTOR) 250 MG capsule Take 250 mg by mouth 2 (two) times daily.   TURMERIC PO Take 1,000 mg by mouth. One  tablet po twice a day   venlafaxine XR (EFFEXOR-XR) 37.5 MG 24 hr capsule Take 1 capsule (37.5 mg total) by mouth every morning.   Vitamin D, Ergocalciferol, (DRISDOL) 1.25 MG (50000 UNIT) CAPS capsule Take 1 capsule (50,000 Units total) by mouth every 7 (seven) days.   vitamin E 1000 UNIT capsule Take 1,000 Units by mouth in the morning and at bedtime.   No facility-administered encounter medications on file as of 03/22/2021.    Pharmacist Review  Have you had any problems recently with your  health? Patient states that she is doing well, just got back from vacation from [place]. Patient states she has no new health issues.  Have you had any problems with your pharmacy? Patient states that she does not have any problems with getting medications or the cost of medications from the pharmacy  What issues or side effects are you having with your medications? Patient states that she does not have any side effects from medication  What would you like me to pass along to [facility] for them to help you with?  Patient states that she is doing good and does not have any concerns about her health or medications at this time.  What can we do to take care of you better?  Patient states not at this time  Star Rating Drugs: Atorvastatin 80 mg 01/13/21 90 ds  Ethelene Hal Clinical Pharmacist Assistant [phone]   Time spent:44

## 2021-03-22 NOTE — Chronic Care Management (AMB) (Signed)
Duplicate/error

## 2021-04-03 ENCOUNTER — Other Ambulatory Visit: Payer: Self-pay | Admitting: Physician Assistant

## 2021-04-03 DIAGNOSIS — I25119 Atherosclerotic heart disease of native coronary artery with unspecified angina pectoris: Secondary | ICD-10-CM

## 2021-04-06 ENCOUNTER — Telehealth: Payer: Self-pay | Admitting: Pharmacist

## 2021-04-06 NOTE — Progress Notes (Signed)
Chronic Care Management Pharmacy Assistant   Name: TEASIA ZANINI  MRN: [id number] DOB: 10-24-1940   Reason for Encounter: Medication [facility] visits:  None in previous 6 months  Medications: Outpatient Encounter Medications as of 04/06/2021  Medication Sig   Ascorbic Acid (VITAMIN C) 500 MG CAPS Take 500 mg by mouth in the morning and at bedtime.   aspirin EC 81 MG tablet Take 81 mg by mouth every morning.    atorvastatin (LIPITOR) 80 MG tablet TAKE ONE TABLET BY MOUTH EVERYDAY AT BEDTIME   benzonatate (TESSALON) 100 MG capsule Take 1 capsule (100 mg total) by mouth 3 (three) times daily as needed for cough. (Patient not taking: Reported on 03/21/2021)   celecoxib (CELEBREX) 50 MG capsule Take 1 capsule (50 mg total) by mouth 2 (two) times daily.   Cinnamon 500 MG TABS Take 1,000 mg by mouth. Once a day   clopidogrel (PLAVIX) 75 MG tablet Take 1 tablet (75 mg total) by mouth every morning.   dexlansoprazole (DEXILANT) 60 MG capsule Take 1 capsule (60 mg total) by mouth every morning.   dicyclomine (BENTYL) 10 MG capsule TAKE ONE TABLET BY MOUTH FOUR TIMES DAILY   diltiazem 2 % GEL Apply to the rectum twice daily x 4 weeks   ezetimibe (ZETIA) 10 MG tablet Take 1 tablet (10 mg total) by mouth every morning.   gabapentin (NEURONTIN) 300 MG capsule TAKE TWO CAPSULES BY MOUTH EVERY MORNING and TAKE TWO CAPSULES BY MOUTH EVERYDAY AT BEDTIME   HYDROcodone-acetaminophen (NORCO/VICODIN) 5-325 MG tablet Take 1 tablet by mouth every 6 (six) hours as needed.   HYDROcodone-acetaminophen (NORCO/VICODIN) 5-325 MG tablet Take 1 tablet by mouth every 6 (six) hours as needed.   Krill Oil 1000 MG CAPS Take 1,000 mg by mouth daily.   levofloxacin (LEVAQUIN) 500 MG tablet Take 1 tablet (500 mg total) by mouth daily.   magnesium oxide (MAG-OX) 400 MG tablet Take 400 mg by mouth daily.   metoprolol succinate (TOPROL-XL) 50 MG 24 hr tablet Take 1 tablet (50 mg total) by mouth daily.   Misc  Natural Products (FOCUSED MIND PO) Take 15 mg by mouth 1 day or 1 dose.   nitroGLYCERIN (NITROSTAT) 0.4 MG SL tablet Place 1 tablet (0.4 mg total) under the tongue every 5 (five) minutes as needed for chest pain.   omega-3 acid ethyl esters (LOVAZA) 1 g capsule Take 1 g by mouth 2 (two) times daily.   Psyllium (DAILY FIBER PO) Take by mouth in the morning and at bedtime.   saccharomyces boulardii (FLORASTOR) 250 MG capsule Take 250 mg by mouth 2 (two) times daily.   TURMERIC PO Take 1,000 mg by mouth. One tablet po twice a day   venlafaxine XR (EFFEXOR-XR) 37.5 MG 24 hr capsule Take 1 capsule (37.5 mg total) by mouth every morning.   Vitamin D, Ergocalciferol, (DRISDOL) 1.25 MG (50000 UNIT) CAPS capsule Take 1 capsule (50,000 Units total) by mouth every 7 (seven) days.   vitamin E 1000 UNIT capsule Take 1,000 Units by mouth in the morning and at bedtime.   No facility-administered encounter medications on file as of 04/06/2021.    Pharmacist Review Received call from patient regarding medication management via [facility].  Patient requested an acute fill for Celocoxib 50 mg to be delivered: 04/06/21 Pharmacy needs refills? No  Confirmed delivery date of 04/06/21, advised patient that pharmacy will contact them the morning of delivery.   Ethelene Hal Clinical  Pharmacist Assistant [phone]   Time spent:15

## 2021-05-02 ENCOUNTER — Emergency Department (HOSPITAL_COMMUNITY): Payer: PPO

## 2021-05-02 ENCOUNTER — Emergency Department (HOSPITAL_COMMUNITY)
Admission: EM | Admit: 2021-05-02 | Discharge: 2021-05-02 | Disposition: A | Discharge status: Home / Self Care | Payer: PPO | Attending: Emergency Medicine | Admitting: Emergency Medicine

## 2021-05-02 DIAGNOSIS — Z96652 Presence of left artificial knee joint: Secondary | ICD-10-CM | POA: Diagnosis not present

## 2021-05-02 DIAGNOSIS — Z7982 Long term (current) use of aspirin: Secondary | ICD-10-CM | POA: Diagnosis not present

## 2021-05-02 DIAGNOSIS — Z79899 Other long term (current) drug therapy: Secondary | ICD-10-CM | POA: Insufficient documentation

## 2021-05-02 DIAGNOSIS — M542 Cervicalgia: Secondary | ICD-10-CM | POA: Diagnosis not present

## 2021-05-02 DIAGNOSIS — S0083XA Contusion of other part of head, initial encounter: Secondary | ICD-10-CM | POA: Insufficient documentation

## 2021-05-02 DIAGNOSIS — M79641 Pain in right hand: Secondary | ICD-10-CM | POA: Insufficient documentation

## 2021-05-02 DIAGNOSIS — Z043 Encounter for examination and observation following other accident: Secondary | ICD-10-CM | POA: Diagnosis not present

## 2021-05-02 DIAGNOSIS — I1310 Hypertensive heart and chronic kidney disease without heart failure, with stage 1 through stage 4 chronic kidney disease, or unspecified chronic kidney disease: Secondary | ICD-10-CM | POA: Diagnosis not present

## 2021-05-02 DIAGNOSIS — W010XXA Fall on same level from slipping, tripping and stumbling without subsequent striking against object, initial encounter: Secondary | ICD-10-CM | POA: Insufficient documentation

## 2021-05-02 DIAGNOSIS — E1122 Type 2 diabetes mellitus with diabetic chronic kidney disease: Secondary | ICD-10-CM | POA: Insufficient documentation

## 2021-05-02 DIAGNOSIS — N1832 Chronic kidney disease, stage 3b: Secondary | ICD-10-CM | POA: Diagnosis not present

## 2021-05-02 DIAGNOSIS — I2510 Atherosclerotic heart disease of native coronary artery without angina pectoris: Secondary | ICD-10-CM | POA: Insufficient documentation

## 2021-05-02 DIAGNOSIS — M19041 Primary osteoarthritis, right hand: Secondary | ICD-10-CM | POA: Diagnosis not present

## 2021-05-02 DIAGNOSIS — R519 Headache, unspecified: Secondary | ICD-10-CM | POA: Diagnosis not present

## 2021-05-02 DIAGNOSIS — S0993XA Unspecified injury of face, initial encounter: Secondary | ICD-10-CM | POA: Diagnosis present

## 2021-05-02 LAB — CBC WITH DIFFERENTIAL/PLATELET
Abs Immature Granulocytes: 0.02 10*3/uL (ref 0.00–0.07)
Basophils Absolute: 0.1 10*3/uL (ref 0.0–0.1)
Basophils Relative: 1 %
Eosinophils Absolute: 0.1 10*3/uL (ref 0.0–0.5)
Eosinophils Relative: 2 %
HCT: 44.2 % (ref 36.0–46.0)
Hemoglobin: 13.9 g/dL (ref 12.0–15.0)
Immature Granulocytes: 0 %
Lymphocytes Relative: 37 %
Lymphs Abs: 2.4 10*3/uL (ref 0.7–4.0)
MCH: 31.2 pg (ref 26.0–34.0)
MCHC: 31.4 g/dL (ref 30.0–36.0)
MCV: 99.1 fL (ref 80.0–100.0)
Monocytes Absolute: 0.5 10*3/uL (ref 0.1–1.0)
Monocytes Relative: 8 %
Neutro Abs: 3.3 10*3/uL (ref 1.7–7.7)
Neutrophils Relative %: 52 %
Platelets: 227 10*3/uL (ref 150–400)
RBC: 4.46 MIL/uL (ref 3.87–5.11)
RDW: 13.1 % (ref 11.5–15.5)
WBC: 6.4 10*3/uL (ref 4.0–10.5)
nRBC: 0 % (ref 0.0–0.2)

## 2021-05-02 LAB — PROTIME-INR
INR: 1 (ref 0.8–1.2)
Prothrombin Time: 13.4 seconds (ref 11.4–15.2)

## 2021-05-02 LAB — COMPREHENSIVE METABOLIC PANEL
ALT: 24 U/L (ref 0–44)
AST: 28 U/L (ref 15–41)
Albumin: 4 g/dL (ref 3.5–5.0)
Alkaline Phosphatase: 65 U/L (ref 38–126)
Anion gap: 7 (ref 5–15)
BUN: 17 mg/dL (ref 8–23)
CO2: 29 mmol/L (ref 22–32)
Calcium: 9.1 mg/dL (ref 8.9–10.3)
Chloride: 101 mmol/L (ref 98–111)
Creatinine, Ser: 0.7 mg/dL (ref 0.44–1.00)
GFR, Estimated: >60 mL/min (ref >60)
Glucose, Bld: 170 mg/dL — ABNORMAL HIGH (ref 70–99)
Potassium: 5 mmol/L (ref 3.5–5.1)
Sodium: 137 mmol/L (ref 135–145)
Total Bilirubin: 0.6 mg/dL (ref 0.3–1.2)
Total Protein: 6.8 g/dL (ref 6.5–8.1)

## 2021-05-02 NOTE — ED Provider Notes (Signed)
Emergency Medicine Provider Triage Evaluation Note  [name] [name] , a 80 y.o. female  was evaluated in triage.  Pt complains of mechanical, non-syncopal fall onto right Bendix. Patient fell onto the metal edge of her safe. She reports she has felt more light-headed recently, but that she did not pass out, did not lose consciousness. She is taking plavix and aspirin. She reports no blurry vision, but does endorse headache, neck pain. She reports she has metal plates in her neck from a previous surgery. She also hit her right middle knuckle.  Review of Systems  Positive: Headache, hematoma, neck pain Negative: Loss of consciousness, facial droop, weakness  Physical Exam  BP (!) 193/92 (BP Location: Right Arm)   Pulse 80   Temp 99.3 F (37.4 C) (Oral)   Resp 17   SpO2 94%  Gen:   Awake, no distress   Resp:  Normal effort  MSK:   Moves extremities without difficulty  Other:  Large hematoma overlying right [place], small hematoma over dorsal MCP of right hand, tenderness of spine  Medical Decision Making  Medically screening exam initiated at 12:30 PM.  Appropriate orders placed.  [name] [name] was informed that the remainder of the evaluation will be completed by another provider, this initial triage assessment does not replace that evaluation, and the importance of remaining in the ED until their evaluation is complete.  Fall, head trauma, blood thinner   Dorien Chihuahua 05/02/21 1234    Valarie Merino, MD 05/03/21 2216

## 2021-05-02 NOTE — ED Triage Notes (Signed)
Pt complains of facial injury since falling and hitting her right cheekbone on the corner of a safe (covered with a blanket). She stipped and fell, no loss of consciousness. She is on Plavix.

## 2021-05-02 NOTE — ED Provider Notes (Signed)
[facility] DEPT Provider Note   CSN: [id number] Arrival date & time: 05/02/21  1204     History Chief Complaint  Patient presents with   Fall   Facial Injury    On Plavix    [name] [name] is a 80 y.o. female.  80 year old female with prior medical history as detailed below presents for evaluation.  Patient reports that she was at home.  She lost her balance and fell.  Her right Reinitz struck a safe as she fell.  She did not pass out.  She denies neck pain.  She complains of hematoma and pain to the right Dobrowski.  She also struck her right hand as she fell.  She complains of pain to the right dorsal hand.  She was ambulatory after the fall.  She denies other significant injury.  The history is provided by the patient.  Fall This is a new problem. The current episode started 1 to 2 hours ago. The problem occurs rarely. The problem has not changed since onset.Pertinent negatives include no chest pain and no abdominal pain. Nothing aggravates the symptoms. Nothing relieves the symptoms.  Facial Injury     Past Medical History:  Diagnosis Date   Allergy    Anal fissure    Anxiety    CAD (coronary artery disease)    s/p inf MI 2007 - tx w/ DES to RCA // Echo 12/08: EF 60%, mild MR, mild LAE // [place] 08/2009: dLM 20%, LAD beyond diagonal 60-70%, stable from prior catheterization in 2007, ostial circumflex 40-50%, mid RCA stent ok, EF 65% // Myoview 08/2009: EF 76%, small inferoapical fixed defect, no ischemia // Myoview 7/13: no scar or ischemia, EF 69% //     Cataract    removed both eyes   Cerebrovascular disease, unspecified    Chronic kidney disease    frequent Kidney Infections   Chronic neck pain    Chronic pain syndrome    Diverticular disease    Dizziness    DJD (degenerative joint disease)    DM type 2 (diabetes mellitus, type 2) ([place])    Echocardiogram    Echo 10/2018: EF 55-60, normal RVSF, mod MAC, mod TR, severe AoV calcification and  sclerosis with nodular calcium/mobile area of calcium in the LVOT (small veg vs Lambl's excrescence  - consider TEE), mild AI, mild AS (mean 11).    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 (Cervical Fusion)   Premature ventricular contractions    Tubular adenoma of colon    Ulcer    [facility]  Urinary incontinence     Patient Active Problem List   Diagnosis Date Noted   Upper respiratory infection 01/19/2021   Stage 3b chronic kidney disease ([place]) 09/13/2020   Unstable angina (HCC)    History of bacterial endocarditis 10/28/2018   Episodic tension-type headache, not intractable 11/08/2014   Other seasonal allergic rhinitis 07/08/2014   Diabetic peripheral neuropathy ([place]) 06/29/2014   Routine general medical examination at a health care facility 01/20/2014   Other screening mammogram 01/20/2014   Essential hypertension 08/03/2009   CAD (coronary artery disease) 01/14/2008   Controlled diabetes mellitus type 2 with complications ([place]) 50/93/2671   Hyperlipidemia with target LDL less than 100 06/13/2007   Depression with anxiety  06/13/2007   GASTROESOPHAGEAL REFLUX DISEASE 06/13/2007   Osteoarthritis 06/13/2007    Past Surgical History:  Procedure Laterality Date   [place]  2007   CARDIAC CATHETERIZATION  2007   Stents   CERVICAL SPINE SURGERY  07/2005   Dr Consuello Masse   CHOLECYSTECTOMY  2007   COLONOSCOPY     CORONARY STENT INTERVENTION N/A 03/09/2020   Procedure: CORONARY STENT INTERVENTION;  Surgeon: Burnell Blanks, MD;  Location: [place] CV [facility];  Service: Cardiovascular;  Laterality: N/A;   DENTAL SURGERY  05/2013   replaced an inplant   EYE SURGERY  2011   Bilteral   I & D KNEE WITH POLY EXCHANGE  09/25/2011   Procedure: IRRIGATION AND DEBRIDEMENT KNEE WITH POLY EXCHANGE;  Surgeon: Johnny Bridge, MD;  Location: [place];  Service: Orthopedics;  Laterality: Left;   INCISION AND DRAINAGE ABSCESS / HEMATOMA OF BURSA / KNEE / THIGH  09/2011   JOINT REPLACEMENT  2012   left   LEFT HEART CATH AND CORONARY ANGIOGRAPHY N/A 03/09/2020   Procedure: LEFT HEART CATH AND CORONARY ANGIOGRAPHY;  Surgeon: Burnell Blanks, MD;  Location: [place] CV [facility];  Service: Cardiovascular;  Laterality: N/A;   left Total Knee Replacement  11/2010   Dr Percell Miller   NECK SURGERY     has had 3 surgeries   POLYPECTOMY     POSTERIOR CERVICAL FUSION/FORAMINOTOMY  04/08/2012   Procedure: POSTERIOR CERVICAL FUSION/FORAMINOTOMY LEVEL 2;  Surgeon: Otilio Connors, MD;  Location: [place] [facility];  Service: Neurosurgery;  Laterality: Left;  Left Cervical six-seven Foraminotomy, bilateral cervical seven-thoracic one foraminotomy, cervical six-seven, cervical seven-thoracic one fusion with posterior instrumentation   TEMPOROMANDIBULAR JOINT SURGERY     thumb surgery     TONSILLECTOMY     TONSILLECTOMY  1950   TOTAL ABDOMINAL HYSTERECTOMY       OB History   No obstetric history on file.     Family History  Problem Relation Age of Onset   Heart disease Mother    Pancreatic cancer Mother    Lung cancer  Brother    Cancer - Other Brother        vocal cord cancer   Colon polyps Sister    Coronary artery disease Other        sibling   Coronary artery disease Other        sibling   Colon cancer Neg Hx    Esophageal cancer Neg Hx    Rectal cancer Neg Hx    Stomach cancer Neg Hx     Social History   Tobacco Use   Smoking status: Never   Smokeless tobacco: Never  Vaping Use   Vaping Use: Never used  Substance Use Topics  Alcohol use: No   Drug use: No    Home Medications Prior to Admission medications   Medication Sig Start Date End Date Taking? Authorizing Provider  Ascorbic Acid (VITAMIN C) 500 MG CAPS Take 500 mg by mouth in the morning and at bedtime.    [provider]  aspirin EC 81 MG tablet Take 81 mg by mouth every morning.     [provider]  atorvastatin (LIPITOR) 80 MG tablet TAKE ONE TABLET BY MOUTH EVERYDAY AT BEDTIME 01/13/21   Janith Lima, MD  benzonatate (TESSALON) 100 MG capsule Take 1 capsule (100 mg total) by mouth 3 (three) times daily as needed for cough. Patient not taking: Reported on 03/21/2021 01/19/21   Plotnikov, Evie Lacks, MD  celecoxib (CELEBREX) 50 MG capsule Take 1 capsule (50 mg total) by mouth 2 (two) times daily. 12/08/20   Janith Lima, MD  Cinnamon 500 MG TABS Take 1,000 mg by mouth. Once a day    [provider]  clopidogrel (PLAVIX) 75 MG tablet Take 1 tablet (75 mg total) by mouth every morning. 01/13/21   Janith Lima, MD  dexlansoprazole (DEXILANT) 60 MG capsule Take 1 capsule (60 mg total) by mouth every morning. 01/13/21   Janith Lima, MD  dicyclomine (BENTYL) 10 MG capsule TAKE ONE TABLET BY MOUTH FOUR TIMES DAILY 01/13/21   Janith Lima, MD  diltiazem 2 % GEL Apply to the rectum twice daily x 4 weeks 12/01/20   Pyrtle, Lajuan Lines, MD  ezetimibe (ZETIA) 10 MG tablet Take 1 tablet (10 mg total) by mouth every morning. 01/13/21   Janith Lima, MD  gabapentin (NEURONTIN) 300 MG capsule TAKE TWO CAPSULES BY  MOUTH EVERY MORNING and TAKE TWO CAPSULES BY MOUTH EVERYDAY AT BEDTIME 02/20/21   Janith Lima, MD  HYDROcodone-acetaminophen (NORCO/VICODIN) 5-325 MG tablet Take 1 tablet by mouth every 6 (six) hours as needed. 01/05/21   Gregor Hams, MD  HYDROcodone-acetaminophen (NORCO/VICODIN) 5-325 MG tablet Take 1 tablet by mouth every 6 (six) hours as needed. 01/05/21   Gregor Hams, MD  Krill Oil 1000 MG CAPS Take 1,000 mg by mouth daily.    [provider]  levofloxacin (LEVAQUIN) 500 MG tablet Take 1 tablet (500 mg total) by mouth daily. 01/19/21   Plotnikov, Evie Lacks, MD  magnesium oxide (MAG-OX) 400 MG tablet Take 400 mg by mouth daily.    [provider]  metoprolol succinate (TOPROL-XL) 50 MG 24 hr tablet Take 1 tablet (50 mg total) by mouth daily. 01/13/21   Sherren Mocha, MD  Misc Natural Products (FOCUSED MIND PO) Take 15 mg by mouth 1 day or 1 dose.    [provider]  nitroGLYCERIN (NITROSTAT) 0.4 MG SL tablet Place 1 tablet (0.4 mg total) under the tongue every 5 (five) minutes as needed for chest pain. 03/11/20   Janith Lima, MD  omega-3 acid ethyl esters (LOVAZA) 1 g capsule Take 1 g by mouth 2 (two) times daily.    [provider]  Psyllium (DAILY FIBER PO) Take by mouth in the morning and at bedtime.    [provider]  saccharomyces boulardii (FLORASTOR) 250 MG capsule Take 250 mg by mouth 2 (two) times daily.    [provider]  TURMERIC PO Take 1,000 mg by mouth. One tablet po twice a day    [provider]  venlafaxine XR (EFFEXOR-XR) 37.5 MG 24 hr capsule Take 1 capsule (37.5 mg  total) by mouth every morning. 01/13/21   Janith Lima, MD  Vitamin D, Ergocalciferol, (DRISDOL) 1.25 MG (50000 UNIT) CAPS capsule Take 1 capsule (50,000 Units total) by mouth every 7 (seven) days. 01/13/21   Janith Lima, MD  vitamin E 1000 UNIT capsule Take 1,000 Units by mouth in the morning and at bedtime.    [provider]     Allergies    Nitrofurantoin, Niacin and related, Ceftriaxone, Codeine, Prednisone, Sulfonamide derivatives, and Tetracycline  Review of Systems   Review of Systems  Cardiovascular:  Negative for chest pain.  Gastrointestinal:  Negative for abdominal pain.  All other systems reviewed and are negative.  Physical Exam Updated Vital Signs BP (!) 188/85 (BP Location: Right Arm)   Pulse 64   Temp 99 F (37.2 C) (Oral)   Resp 16   SpO2 99%   Physical Exam Vitals and nursing note reviewed.  Constitutional:      General: She is not in acute distress.    Appearance: Normal appearance. She is well-developed.  HENT:     Head: Normocephalic.     Comments: Contusion and hematoma noted to the right St[place]ke just inferior to the right eye. Eyes:     Conjunctiva/sclera: Conjunctivae normal.     Pupils: Pupils are equal, round, and reactive to light.  Cardiovascular:     Rate and Rhythm: Normal rate and regular rhythm.     Heart sounds: Normal heart sounds.  Pulmonary:     Effort: Pulmonary effort is normal. No respiratory distress.     Breath sounds: Normal breath sounds.  Abdominal:     General: There is no distension.     Palpations: Abdomen is soft.     Tenderness: There is no abdominal tenderness.  Musculoskeletal:        General: No deformity. Normal range of motion.     Cervical back: Normal range of motion and neck supple.  Skin:    General: Skin is warm and dry.  Neurological:     General: No focal deficit present.     Mental Status: She is alert and oriented to person, place, and time.    ED Results / Procedures / Treatments   Labs (all labs ordered are listed, but only abnormal results are displayed) Labs Reviewed  COMPREHENSIVE METABOLIC PANEL - Abnormal; Notable for the following components:      Result Value   Glucose, Bld 170 (*)    All other components within normal limits  CBC WITH DIFFERENTIAL/PLATELET  PROTIME-INR    EKG None  Radiology CT HEAD WO  CONTRAST (5MM)  Result Date: 05/02/2021 CLINICAL DATA:  Status post fall. Complains of posterior neck pain, headache and swelling and bruising to right [place]. EXAM: CT HEAD WITHOUT CONTRAST CT MAXILLOFACIAL WITHOUT CONTRAST CT CERVICAL SPINE WITHOUT CONTRAST TECHNIQUE: Multidetector CT imaging of the head, cervical spine, and maxillofacial structures were performed using the standard protocol without intravenous contrast. Multiplanar CT image reconstructions of the cervical spine and maxillofacial structures were also generated. COMPARISON:  01/23/2018 FINDINGS: CT HEAD FINDINGS Brain: No evidence of acute infarction, hemorrhage, hydrocephalus, extra-axial collection or mass lesion/mass effect. There is mild diffuse low-attenuation within the subcortical and periventricular white matter compatible with chronic microvascular disease. Remote, small bilateral basal ganglia lacunar infarcts identified. Prominence of sulci and ventricles compatible with brain atrophy. Vascular: No hyperdense vessel or unexpected calcification. Skull: Normal. Negative for fracture or focal lesion. Other: None. CT MAXILLOFACIAL FINDINGS Osseous: No fracture or mandibular dislocation.  No destructive process. Chronic degenerative change is noted involving the left temporomandibular joint which appears similar to 01/23/2018. Orbits: Negative. No traumatic or inflammatory finding. Sinuses: The paranasal sinuses are clear. Soft tissues: There is a large right facial hematoma just below the right orbit measuring 2.6 x 1.9 cm. No underlying fracture identified. CT CERVICAL SPINE FINDINGS Alignment: Normal. Skull base and vertebrae: Status post posterior hardware fixation of C2 through C4. Anterior screw and plate device is noted extending from C6 through T1. Posterior hardware fixation has also been performed at C6 through T1. Solid fusion of the C4 through T1 vertebra noted. No signs of acute fracture. Soft tissues and spinal canal: No  prevertebral fluid or swelling. No visible canal hematoma. Disc levels: There is solid fusion of the disc spaces at C4 through T1. Upper chest: Negative. Other: None IMPRESSION: 1. No acute intracranial abnormalities. 2. Chronic small vessel ischemic disease and brain atrophy. 3. Large right facial hematoma. 4. No evidence for facial bone fracture. 5. No evidence for cervical spine fracture or dislocation. 6. Extensive cervical fusion. Electronically Signed   By: Kerby Moors M.D.   On: 05/02/2021 13:33   CT Cervical Spine Wo Contrast  Result Date: 05/02/2021 CLINICAL DATA:  Status post fall. Complains of posterior neck pain, headache and swelling and bruising to right [place]. EXAM: CT HEAD WITHOUT CONTRAST CT MAXILLOFACIAL WITHOUT CONTRAST CT CERVICAL SPINE WITHOUT CONTRAST TECHNIQUE: Multidetector CT imaging of the head, cervical spine, and maxillofacial structures were performed using the standard protocol without intravenous contrast. Multiplanar CT image reconstructions of the cervical spine and maxillofacial structures were also generated. COMPARISON:  01/23/2018 FINDINGS: CT HEAD FINDINGS Brain: No evidence of acute infarction, hemorrhage, hydrocephalus, extra-axial collection or mass lesion/mass effect. There is mild diffuse low-attenuation within the subcortical and periventricular white matter compatible with chronic microvascular disease. Remote, small bilateral basal ganglia lacunar infarcts identified. Prominence of sulci and ventricles compatible with brain atrophy. Vascular: No hyperdense vessel or unexpected calcification. Skull: Normal. Negative for fracture or focal lesion. Other: None. CT MAXILLOFACIAL FINDINGS Osseous: No fracture or mandibular dislocation. No destructive process. Chronic degenerative change is noted involving the left temporomandibular joint which appears similar to 01/23/2018. Orbits: Negative. No traumatic or inflammatory finding. Sinuses: The paranasal sinuses are clear.  Soft tissues: There is a large right facial hematoma just below the right orbit measuring 2.6 x 1.9 cm. No underlying fracture identified. CT CERVICAL SPINE FINDINGS Alignment: Normal. Skull base and vertebrae: Status post posterior hardware fixation of C2 through C4. Anterior screw and plate device is noted extending from C6 through T1. Posterior hardware fixation has also been performed at C6 through T1. Solid fusion of the C4 through T1 vertebra noted. No signs of acute fracture. Soft tissues and spinal canal: No prevertebral fluid or swelling. No visible canal hematoma. Disc levels: There is solid fusion of the disc spaces at C4 through T1. Upper chest: Negative. Other: None IMPRESSION: 1. No acute intracranial abnormalities. 2. Chronic small vessel ischemic disease and brain atrophy. 3. Large right facial hematoma. 4. No evidence for facial bone fracture. 5. No evidence for cervical spine fracture or dislocation. 6. Extensive cervical fusion. Electronically Signed   By: Kerby Moors M.D.   On: 05/02/2021 13:33   DG Hand Complete Right  Result Date: 05/02/2021 CLINICAL DATA:  Fall EXAM: RIGHT HAND - COMPLETE 3+ VIEW COMPARISON:  Finger radiograph 04/23/2014 FINDINGS: There is no evidence of acute fracture. There is severe base of thumb osteoarthritis including the triscaphe  joint and first carpometacarpal joint. There is severe osteoarthritis of the index and middle finger distal interphalangeal joints with bulky osteophytes and central erosive change. Mild-to-moderate osteoarthritis of the thumb and index finger MCP, and the second through fifth proximal interphalangeal joints. Old ulnar styloid injury. IMPRESSION: No evidence of acute fracture. Severe osteoarthritis of the base of thumb and distal interphalangeal joints of the index and middle fingers with features of erosive OA. Electronically Signed   By: Maurine Simmering M.D.   On: 05/02/2021 13:34   CT Maxillofacial Wo Contrast  Result Date:  05/02/2021 CLINICAL DATA:  Status post fall. Complains of posterior neck pain, headache and swelling and bruising to right [place]. EXAM: CT HEAD WITHOUT CONTRAST CT MAXILLOFACIAL WITHOUT CONTRAST CT CERVICAL SPINE WITHOUT CONTRAST TECHNIQUE: Multidetector CT imaging of the head, cervical spine, and maxillofacial structures were performed using the standard protocol without intravenous contrast. Multiplanar CT image reconstructions of the cervical spine and maxillofacial structures were also generated. COMPARISON:  01/23/2018 FINDINGS: CT HEAD FINDINGS Brain: No evidence of acute infarction, hemorrhage, hydrocephalus, extra-axial collection or mass lesion/mass effect. There is mild diffuse low-attenuation within the subcortical and periventricular white matter compatible with chronic microvascular disease. Remote, small bilateral basal ganglia lacunar infarcts identified. Prominence of sulci and ventricles compatible with brain atrophy. Vascular: No hyperdense vessel or unexpected calcification. Skull: Normal. Negative for fracture or focal lesion. Other: None. CT MAXILLOFACIAL FINDINGS Osseous: No fracture or mandibular dislocation. No destructive process. Chronic degenerative change is noted involving the left temporomandibular joint which appears similar to 01/23/2018. Orbits: Negative. No traumatic or inflammatory finding. Sinuses: The paranasal sinuses are clear. Soft tissues: There is a large right facial hematoma just below the right orbit measuring 2.6 x 1.9 cm. No underlying fracture identified. CT CERVICAL SPINE FINDINGS Alignment: Normal. Skull base and vertebrae: Status post posterior hardware fixation of C2 through C4. Anterior screw and plate device is noted extending from C6 through T1. Posterior hardware fixation has also been performed at C6 through T1. Solid fusion of the C4 through T1 vertebra noted. No signs of acute fracture. Soft tissues and spinal canal: No prevertebral fluid or swelling. No  visible canal hematoma. Disc levels: There is solid fusion of the disc spaces at C4 through T1. Upper chest: Negative. Other: None IMPRESSION: 1. No acute intracranial abnormalities. 2. Chronic small vessel ischemic disease and brain atrophy. 3. Large right facial hematoma. 4. No evidence for facial bone fracture. 5. No evidence for cervical spine fracture or dislocation. 6. Extensive cervical fusion. Electronically Signed   By: Kerby Moors M.D.   On: 05/02/2021 13:33    Procedures Procedures   Medications Ordered in ED Medications - No data to display  ED Course  I have reviewed the triage vital signs and the nursing notes.  Pertinent labs & imaging results that were available during my care of the patient were reviewed by me and considered in my medical decision making (see chart for details).    MDM Rules/Calculators/A&P                           MDM  MSE complete  MA MUNOZ was evaluated in Emergency Department on 05/02/2021 for the symptoms described in the history of present illness. She was evaluated in the context of the global COVID-19 pandemic, which necessitated consideration that the patient might be at risk for infection with the SARS-CoV-2 virus that causes COVID-19. Institutional protocols and algorithms that pertain  to the evaluation of patients at risk for COVID-19 are in a state of rapid change based on information released by regulatory bodies including the CDC and federal and state organizations. These policies and algorithms were followed during the patient's care in the ED.  Patient presents with fall from standing at home.   She has large contusion to the right [place].  Imaging obtained is without evidence of significant traumatic injury.  Patient is comfortable at time of discharge.  Importance of close follow-up is stressed.  Strict return precautions given and understood.  Final Clinical Impression(s) / ED Diagnoses Final diagnoses:  Contusion of  face, initial encounter    Rx / DC Orders ED Discharge Orders     None        Valarie Merino, MD 05/02/21 1534

## 2021-05-02 NOTE — Discharge Instructions (Addendum)
Return for any problem.  ?

## 2021-05-12 ENCOUNTER — Other Ambulatory Visit: Payer: Self-pay

## 2021-05-12 ENCOUNTER — Ambulatory Visit (INDEPENDENT_AMBULATORY_CARE_PROVIDER_SITE_OTHER): Payer: PPO | Admitting: Internal Medicine

## 2021-05-12 ENCOUNTER — Encounter: Payer: Self-pay | Admitting: Internal Medicine

## 2021-05-12 VITALS — BP 126/80 | HR 72 | Temp 99.1°F | Resp 18 | Ht 60.0 in | Wt 128.8 lb

## 2021-05-12 DIAGNOSIS — Z23 Encounter for immunization: Secondary | ICD-10-CM | POA: Diagnosis not present

## 2021-05-12 DIAGNOSIS — Z8673 Personal history of transient ischemic attack (TIA), and cerebral infarction without residual deficits: Secondary | ICD-10-CM | POA: Diagnosis not present

## 2021-05-12 DIAGNOSIS — D689 Coagulation defect, unspecified: Secondary | ICD-10-CM | POA: Diagnosis not present

## 2021-05-12 DIAGNOSIS — S0083XA Contusion of other part of head, initial encounter: Secondary | ICD-10-CM

## 2021-05-12 NOTE — Addendum Note (Signed)
Addended by: Thomes Cake on: 05/12/2021 03:37 PM   Modules accepted: Orders

## 2021-05-12 NOTE — Patient Instructions (Signed)
You can use the tylenol for pain.  You can use heating pad or ice on the face to help this go away faster. It may be a month or two.  Think about chair yoga or tai chi for balance.

## 2021-05-12 NOTE — Progress Notes (Signed)
   Subjective:   Patient ID: [name] [name], female    DOB: 01/16/1941, 80 y.o.   MRN: [id number]  HPI The patient is an 80 YO female coming in for ER follow up (in for fall trip over box, CT head done with past infarct and hematoma no fx, x-ray wrist without fracture). Doing okay since then, using tylenol for pain.   PMH, [facility], social history reviewed and updated  Review of Systems  Constitutional: Negative.   HENT: Negative.    Eyes: Negative.   Respiratory:  Negative for cough, chest tightness and shortness of breath.   Cardiovascular:  Negative for chest pain, palpitations and leg swelling.  Gastrointestinal:  Negative for abdominal distention, abdominal pain, constipation, diarrhea, nausea and vomiting.  Musculoskeletal: Negative.   Skin: Negative.   Neurological: Negative.   Hematological:  Bruises/bleeds easily.  Psychiatric/Behavioral: Negative.     Objective:  Physical Exam Constitutional:      Appearance: She is well-developed.  HENT:     Head: Normocephalic.     Comments: Bruising on the right face with 2 cm by 3 cm oval hematoma firm to touch without sign of infection Cardiovascular:     Rate and Rhythm: Normal rate and regular rhythm.  Pulmonary:     Effort: Pulmonary effort is normal. No respiratory distress.     Breath sounds: Normal breath sounds. No wheezing or rales.  Abdominal:     General: Bowel sounds are normal. There is no distension.     Palpations: Abdomen is soft.     Tenderness: There is no abdominal tenderness. There is no rebound.  Musculoskeletal:     Cervical back: Normal range of motion.  Skin:    General: Skin is warm and dry.  Neurological:     Mental Status: She is alert and oriented to person, place, and time.     Coordination: Coordination abnormal.    Vitals:   05/12/21 1438  BP: 126/80  Pulse: 72  Resp: 18  Temp: 99.1 F (37.3 C)  TempSrc: Oral  SpO2: 96%  Weight: 128 lb 12.8 oz (58.4 kg)  Height: 5' (1.524 m)    This  visit occurred during the SARS-CoV-2 public health emergency.  Safety protocols were in place, including screening questions prior to the visit, additional usage of staff PPE, and extensive cleaning of exam room while observing appropriate contact time as indicated for disinfecting solutions.   Assessment & Plan:  Flu shot given at visit

## 2021-05-12 NOTE — Assessment & Plan Note (Signed)
Due to plavix she did have larger than expected bruising and hematoma from recent fall. Advised on how to stop bleeding if present and expected timeline for resolution of hematoma. Needs to continue plavix to prevent future strokes.

## 2021-05-12 NOTE — Assessment & Plan Note (Signed)
Happened about 10 days ago and overall firm without infection on exam. Advised heating pad to area to help with resolution. Does have increased risk of bleeding taking plavix daily.

## 2021-05-12 NOTE — Assessment & Plan Note (Addendum)
Noted prior stroke on CT head recently. She admits to hearing about that previously. Does admit that her balance is unsteadier than earlier in life. Advised to consider chair yoga or tai chi for balance. Be cautious about trip hazards. I have recommended cane when in unfamiliar locations for extra stability. She is on statin and BP at goal. Does have diabetes which is well controlled last HgA1c 7.1 Is on plavix daily presumably for prevention of future stroke.

## 2021-05-16 ENCOUNTER — Encounter: Payer: Self-pay | Admitting: Internal Medicine

## 2021-05-16 ENCOUNTER — Ambulatory Visit: Payer: PPO | Admitting: Internal Medicine

## 2021-05-16 VITALS — BP 138/70 | HR 80 | Ht 59.0 in | Wt 128.0 lb

## 2021-05-16 DIAGNOSIS — Z800 Family history of malignant neoplasm of digestive organs: Secondary | ICD-10-CM

## 2021-05-16 DIAGNOSIS — K648 Other hemorrhoids: Secondary | ICD-10-CM | POA: Diagnosis not present

## 2021-05-16 NOTE — Patient Instructions (Addendum)
You have been scheduled for a CT scan of the abdomen and pelvis at [facility], 1st floor Radiology. You are scheduled on 05/25/21  at 10:30am. You should arrive 15 minutes prior to your appointment time for registration.   Please follow the written instructions below on the day of your exam:   1) Do not eat anything after 6:30am (4 hours prior to your test)   You may take any medications as prescribed with a small amount of water, if necessary. If you take any of the following medications: METFORMIN, GLUCOPHAGE, GLUCOVANCE, AVANDAMET, RIOMET, FORTAMET, [place] MET, JANUMET, GLUMETZA or METAGLIP, you MAY be asked to HOLD this medication 48 hours AFTER the exam.   The purpose of you drinking the oral contrast is to aid in the visualization of your intestinal tract. The contrast solution may cause some diarrhea. Depending on your individual set of symptoms, you may also receive an intravenous injection of x-ray contrast/dye. Plan on being at [facility] for 45 minutes or longer, depending on the type of exam you are having performed.   If you have any questions regarding your exam or if you need to reschedule, you may call [facility] at [phone] between the hours of 8:00 am and 5:00 pm, Monday-Friday.   Your next hemorrhoid banding is on: 07/25/21 @ 4:00pm  HEMORRHOID BANDING PROCEDURE    FOLLOW-UP CARE   The procedure you have had should have been relatively painless since the banding of the area involved does not have nerve endings and there is no pain sensation.  The rubber band cuts off the blood supply to the hemorrhoid and the band may fall off as soon as 48 hours after the banding (the band may occasionally be seen in the toilet bowl following a bowel movement). You may notice a temporary feeling of fullness in the rectum which should respond adequately to plain Tylenol or Motrin.  Following the banding, avoid strenuous exercise that evening and resume full activity  the next day.  A sitz bath (soaking in a warm tub) or bidet is soothing, and can be useful for cleansing the area after bowel movements.     To avoid constipation, take two tablespoons of natural wheat bran, natural oat bran, flax, Benefiber or any over the counter fiber supplement and increase your water intake to 7-8 glasses daily.    Unless you have been prescribed anorectal medication, do not put anything inside your rectum for two weeks: No suppositories, enemas, fingers, etc.  Occasionally, you may have more bleeding than usual after the banding procedure.  This is often from the untreated hemorrhoids rather than the treated one.  Don't be concerned if there is a tablespoon or so of blood.  If there is more blood than this, lie flat with your bottom higher than your head and apply an ice pack to the area. If the bleeding does not stop within a half an hour or if you feel faint, call our office at [phone] or go to the emergency room.  Problems are not common; however, if there is a substantial amount of bleeding, severe pain, chills, fever or difficulty passing urine (very rare) or other problems, you should call us at [phone] or report to the nearest emergency room.  Do not stay seated continuously for more than 2-3 hours for a day or two after the procedure.  Tighten your buttock muscles 10-15 times every two hours and take 10-15 deep breaths every 1-2 hours.  Do not spend more than a few minutes on the toilet if you cannot empty your bowel; instead re-visit the toilet at a later time.   If you are age 28 or older, your body mass index should be between 23-30. Your Body mass index is 25.85 kg/m. If this is out of the aforementioned range listed, please consider follow up with your Primary Care Provider.  __________________________________________________________  The [facility] providers would like to encourage you to use [facility] to communicate with providers for non-urgent  requests or questions.  Due to long hold times on the telephone, sending your provider a message by [facility] may be a faster and more efficient way to get a response.  Please allow 48 business hours for a response.  Please remember that this is for non-urgent requests.    Thank you for choosing me and [facility].  Dr.Pyrtle

## 2021-05-16 NOTE — Progress Notes (Signed)
famil

## 2021-05-17 ENCOUNTER — Other Ambulatory Visit: Payer: Self-pay

## 2021-05-17 ENCOUNTER — Encounter: Payer: Self-pay | Admitting: Physician Assistant

## 2021-05-17 ENCOUNTER — Ambulatory Visit: Payer: PPO | Admitting: Physician Assistant

## 2021-05-17 VITALS — BP 140/80 | HR 91 | Ht 59.0 in | Wt 127.6 lb

## 2021-05-17 DIAGNOSIS — R079 Chest pain, unspecified: Secondary | ICD-10-CM | POA: Diagnosis not present

## 2021-05-17 DIAGNOSIS — I10 Essential (primary) hypertension: Secondary | ICD-10-CM

## 2021-05-17 DIAGNOSIS — I25119 Atherosclerotic heart disease of native coronary artery with unspecified angina pectoris: Secondary | ICD-10-CM

## 2021-05-17 DIAGNOSIS — I25110 Atherosclerotic heart disease of native coronary artery with unstable angina pectoris: Secondary | ICD-10-CM | POA: Diagnosis not present

## 2021-05-17 MED ORDER — ISOSORBIDE MONONITRATE ER 30 MG PO TB24: 30.0000 mg | ORAL_TABLET | Freq: Every day | ORAL | 3 refills | Status: DC

## 2021-05-17 NOTE — Patient Instructions (Addendum)
Medication Instructions:   START Imdur 30 mg taking 1 tablet daily    *If you need a refill on your cardiac medications before your next appointment, please call your pharmacy*   Lab Work: None ordered  If you have labs (blood work) drawn today and your tests are completely normal, you will receive your results only by: [place] (if you have MyChart) OR A paper copy in the mail If you have any lab test that is abnormal or we need to change your treatment, we will call you to review the results.   Testing/Procedures: None ordered   Follow-Up: At [facility], you and your health needs are our priority.  As part of our continuing mission to provide you with exceptional heart care, we have created designated Provider Care Teams.  These Care Teams include your primary Cardiologist (physician) and Advanced Practice Providers (APPs -  Physician Assistants and Nurse Practitioners) who all work together to provide you with the care you need, when you need it.  We recommend signing up for the patient portal called "MyChart".  Sign up information is provided on this After Visit Summary.  MyChart is used to connect with patients for Virtual Visits (Telemedicine).  Patients are able to view lab/test results, encounter notes, upcoming appointments, etc.  Non-urgent messages can be sent to your provider as well.   To learn more about what you can do with MyChart, go to [email or web address].    Your next appointment:   06/23/2021 ARRIVE AT 8:30.  YOU WILL SEE SCOTT WEAVER, PA-C, ON A DAY DR. Burt Knack IS HERE  The format for your next appointment:   In Person  Provider:   You may see Sherren Mocha, MD or one of the following Advanced Practice Providers on your designated Care Team:   Richardson Dopp, PA-C Robbie Lis, [place]   Other Instructions

## 2021-05-17 NOTE — Progress Notes (Signed)
Cardiology Office Note:    Date:  05/17/2021   ID:  ARALI [name], DOB 16-Mar-1941, MRN [id number]  PCP:  [name] Lima, MD  [facility] Cardiologist:  [name] Mocha, MD  [facility] Electrophysiologist:  None   Chief Complaint: 10 months follow up   History of Present Illness:    [name] [name] is a 80 y.o. female with a hx of CAD, bacterial endocarditis, hypertension, hyperlipidemia and diabetes mellitus seen for follow up.   She has a known history of CAD with prior inferior MI in 2007 with DES to RCA, POBA to [facility] and DES to [facility] in 2018 ([name] [name] in [place], [place]), moderate LAD disease that was negative by FFR, low risk Myoview from March of 2021 and last cath in July of 2021 with DES to [facility].  Last seen by Dr. Burt [name] 07/2020.  Patient is here for follow-up.  2 weeks ago patient had mechanical fall.  She tripped over boxes and injured her face.  She was evaluated in ER and discharged.  CT of head without acute intracranial abnormality but did showed chronic small vessel disease.  No loss of consciousness or prodromal symptoms at that time.  Patient been using cane for ambulation since then and significantly cut down on activity.  In past 2 months, patient had 3 episode of severe substernal chest tightness radiating to her left shoulder.  She took sublingual nitroglycerin x1 each time with resolution of her pain.  Symptoms similar to prior stenting.  She denies orthopnea, PND, syncope, lower extremity edema or melena.  Past Medical History:  Diagnosis Date   Allergy    Anal fissure    Anxiety    CAD (coronary artery disease)    s/p inf MI 2007 - tx w/ DES to RCA // Echo 12/08: EF 60%, mild MR, mild LAE // [place] 08/2009: dLM 20%, LAD beyond diagonal 60-70%, stable from prior catheterization in 2007, ostial circumflex 40-50%, mid RCA stent ok, EF 65% // Myoview 08/2009: EF 76%, small inferoapical fixed defect, no ischemia // Myoview 7/13: no scar or ischemia, EF 69% //      Cataract    removed both eyes   Cerebrovascular disease, unspecified    Chronic kidney disease    frequent Kidney Infections   Chronic neck pain    Chronic pain syndrome    Diverticular disease    Dizziness    DJD (degenerative joint disease)    DM type 2 (diabetes mellitus, type 2) ([place])    Echocardiogram    Echo 10/2018: EF 55-60, normal RVSF, mod MAC, mod TR, severe AoV calcification and sclerosis with nodular calcium/mobile area of calcium in the LVOT (small veg vs Lambl's excrescence  - consider TEE), mild AI, mild AS (mean 11).    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 (Cervical Fusion)   Premature ventricular contractions    Stroke ([place])    Tubular adenoma of colon    Ulcer    [facility]   Urinary incontinence     Past Surgical History:  Procedure Laterality Date   APPENDECTOMY  1955   BLADDER REPAIR  2007   CARDIAC CATHETERIZATION  2007   Stents   CERVICAL SPINE SURGERY  07/2005   Dr Consuello Masse   CHOLECYSTECTOMY  2007   COLONOSCOPY     CORONARY STENT INTERVENTION N/A 03/09/2020   Procedure: CORONARY STENT INTERVENTION;   Surgeon: [name] Blanks, MD;  Location: [place] CV LAB;  Service: Cardiovascular;  Laterality: N/A;   DENTAL SURGERY  05/2013   replaced an inplant   EYE SURGERY  2011   Bilteral   I & D KNEE WITH POLY EXCHANGE  09/25/2011   Procedure: IRRIGATION AND DEBRIDEMENT KNEE WITH POLY EXCHANGE;  Surgeon: [name] Bridge, MD;  Location: [place];  Service: Orthopedics;  Laterality: Left;   INCISION AND DRAINAGE ABSCESS / HEMATOMA OF BURSA / KNEE / THIGH  09/2011   JOINT REPLACEMENT  2012   left   LEFT HEART CATH AND CORONARY ANGIOGRAPHY N/A 03/09/2020   Procedure: LEFT HEART CATH AND CORONARY ANGIOGRAPHY;  Surgeon: [name] Blanks, MD;  Location: [place] CV LAB;  Service: Cardiovascular;  Laterality: N/A;   left Total Knee Replacement  11/2010   Dr [name] [name]   NECK SURGERY     has had 3 surgeries   POLYPECTOMY     POSTERIOR CERVICAL FUSION/FORAMINOTOMY  04/08/2012   Procedure: POSTERIOR CERVICAL FUSION/FORAMINOTOMY LEVEL 2;  Surgeon: [name] Connors, MD;  Location: [place] NEURO ORS;  Service: Neurosurgery;  Laterality: Left;  Left Cervical six-seven Foraminotomy, bilateral cervical seven-thoracic one foraminotomy, cervical six-seven, cervical seven-thoracic one fusion with posterior instrumentation   TEMPOROMANDIBULAR JOINT SURGERY     thumb surgery     TONSILLECTOMY     TONSILLECTOMY  1950   TOTAL ABDOMINAL HYSTERECTOMY      Current Medications: Current Meds  Medication Sig   Ascorbic Acid (VITAMIN C) 500 MG CAPS Take 500 mg by mouth in the morning and at bedtime.   aspirin EC 81 MG tablet Take 81 mg by mouth every morning.    atorvastatin (LIPITOR) 80 MG tablet TAKE ONE TABLET BY MOUTH EVERYDAY AT BEDTIME   celecoxib (CELEBREX) 50 MG capsule Take 1 capsule (50 mg total) by mouth 2 (two) times daily.   Cinnamon 500 MG TABS Take 1,000 mg by mouth. Once a day   clopidogrel (PLAVIX) 75 MG tablet Take 1 tablet (75 mg total) by mouth every morning.   dexlansoprazole (DEXILANT) 60 MG  capsule Take 1 capsule (60 mg total) by mouth every morning.   dicyclomine (BENTYL) 10 MG capsule TAKE ONE TABLET BY MOUTH FOUR TIMES DAILY   diltiazem 2 % GEL Apply to the rectum twice daily x 4 weeks   ezetimibe (ZETIA) 10 MG tablet Take 1 tablet (10 mg total) by mouth every morning.   gabapentin (NEURONTIN) 300 MG capsule TAKE TWO CAPSULES BY MOUTH EVERY MORNING and TAKE TWO CAPSULES BY MOUTH EVERYDAY AT BEDTIME   isosorbide mononitrate (IMDUR) 30 MG 24 hr tablet Take 1 tablet (30 mg total) by  mouth daily.   Krill Oil 1000 MG CAPS Take 1,000 mg by mouth daily.   magnesium oxide (MAG-OX) 400 MG tablet Take 400 mg by mouth daily.   metoprolol succinate (TOPROL-XL) 50 MG 24 hr tablet Take 1 tablet (50 mg total) by mouth daily.   Misc Natural Products (FOCUSED MIND PO) Take 15 mg by mouth 1 day or 1 dose.   nitroGLYCERIN (NITROSTAT) 0.4 MG SL tablet Place 1 tablet (0.4 mg total) under the tongue every 5 (five) minutes as needed for chest pain.   omega-3 acid ethyl esters (LOVAZA) 1 g capsule Take 1 g by mouth 2 (two) times daily.   Psyllium (DAILY FIBER PO) Take by mouth in the morning and at bedtime.   saccharomyces boulardii (FLORASTOR) 250 MG capsule Take 250 mg by mouth 2 (two) times daily.   TURMERIC PO Take 1,000 mg by mouth. One tablet po twice a day   venlafaxine XR (EFFEXOR-XR) 37.5 MG 24 hr capsule Take 1 capsule (37.5 mg total) by mouth every morning.   Vitamin D, Ergocalciferol, (DRISDOL) 1.25 MG (50000 UNIT) CAPS capsule Take 1 capsule (50,000 Units total) by mouth every 7 (seven) days.   vitamin E 1000 UNIT capsule Take 1,000 Units by mouth in the morning and at bedtime.     Allergies:   Nitrofurantoin, Niacin and related, Ceftriaxone, Codeine, Prednisone, Sulfonamide derivatives, and Tetracycline   Social History   Socioeconomic History   Marital status: Widowed    Spouse name: dolly Ryant   Number of children: Not on file   Years of education: Not on file   Highest  education level: Not on file  Occupational History    Employer: RETIRED  Tobacco Use   Smoking status: Never   Smokeless tobacco: Never  Vaping Use   Vaping Use: Never used  Substance and Sexual Activity   Alcohol use: No   Drug use: No   Sexual activity: Yes  Other Topics Concern   Not on file  Social History Narrative   Pt never knew her father   Daily caffeine use   Social Determinants of Health   Financial Resource Strain: Low Risk    Difficulty of Paying Living Expenses: Not hard at all  Food Insecurity: No Food Insecurity   Worried About Charity fundraiser in the Last Year: Never true   [place] in the Last Year: Never true  Transportation Needs: No Transportation Needs   Lack of Transportation (Medical): No   Lack of Transportation (Non-Medical): No  Physical Activity: Sufficiently Active   Days of Exercise per Week: 5 days   Minutes of Exercise per Session: 30 min  Stress: No Stress Concern Present   Feeling of Stress : Not at all  Social Connections: Moderately Integrated   Frequency of Communication with Friends and Family: More than three times a week   Frequency of Social Gatherings with Friends and Family: More than three times a week   Attends Religious Services: More than 4 times per year   Active Member of [employer] or Organizations: Yes   Attends Archivist Meetings: More than 4 times per year   Marital Status: Widowed     Family History: The patient's family history includes Cancer - Other in her brother; Colon polyps in her sister; Coronary artery disease in some other family members; Heart disease in her mother; Lung cancer in her brother; Pancreatic cancer in her mother. There is no history of Colon cancer, Esophageal  cancer, Rectal cancer, or Stomach cancer.    ROS:   Please see the history of present illness.    All other systems reviewed and are negative.   EKGs/Labs/Other Studies Reviewed:    The following studies were reviewed  today:  CORONARY STENT INTERVENTION  03/2020  LEFT HEART CATH AND CORONARY ANGIOGRAPHY   Conclusion    Mid RCA lesion is 10% stenosed. Prox RCA to Mid RCA lesion is 95% stenosed. A drug-eluting stent was successfully placed using a [employer] [id number]. Post intervention, there is a 0% residual stenosis. Prox Cx to Mid Cx lesion is 30% stenosed. Prox LAD to Mid LAD lesion is 50% stenosed. Mid LAD lesion is 30% stenosed.   1. Large, dominant RCA with patent mid stents. Severe stenosis just prior to the stented segment in the mid vessel.  2. Moderate non-obstructive proximal LAD stenosis 3. Successful PTCA/DES x 1 mid RCA   Recommendations: Continue DAPT with ASA and Plavix. Continue statin and beta blocker.    Dominance: Right    EKG:  EKG is  ordered today.  The ekg ordered today demonstrates normal sinus rhythm, T wave inversion in lead III and aVF  Recent Labs: 09/12/2020: TSH 1.54 05/02/2021: ALT 24; BUN 17; Creatinine, Ser 0.70; Hemoglobin 13.9; Platelets 227; Potassium 5.0; Sodium 137  Recent Lipid Panel    Component Value Date/Time   CHOL 112 09/12/2020 1516   TRIG 244.0 (H) 09/12/2020 1516   TRIG 164 (H) 06/24/2006 0738   HDL 48.80 09/12/2020 1516   CHOLHDL 2 09/12/2020 1516   VLDL 48.8 (H) 09/12/2020 1516   LDLCALC 26 04/30/2019 1134   LDLDIRECT 42.0 09/12/2020 1516    Physical Exam:    VS:  BP 140/80   Pulse 91   Ht _0  (1.499 m)   Wt 127 lb 9.6 oz (57.9 kg)   SpO2 93%   BMI 25.77 kg/m     Wt Readings from Last 3 Encounters:  05/17/21 127 lb 9.6 oz (57.9 kg)  05/16/21 128 lb (58.1 kg)  05/12/21 128 lb 12.8 oz (58.4 kg)     GEN:  Well nourished, well developed in no acute distress HEENT: Right-sided facial hematoma NECK: No JVD; No carotid bruits LYMPHATICS: No lymphadenopathy CARDIAC: RRR, no murmurs, rubs, gallops RESPIRATORY:  Clear to auscultation without rales, wheezing or rhonchi  ABDOMEN: Soft, non-tender,  non-distended MUSCULOSKELETAL:  No edema; No deformity  SKIN: Warm and dry NEUROLOGIC:  Alert and oriented x 3 PSYCHIATRIC:  Normal affect   ASSESSMENT AND PLAN:    Unstable angina with CAD Patient had a 3 episode of substernal chest tightness radiating to her shoulder.  Each episode resolved with sublingual nitroglycerin x1.  Symptoms similar to prior PCI.  EKG today showed T wave inversion in lead III and aVF which is new.  Discussed stress test versus cardiac catheterization.  Avoided catheterization given significant facial hematoma.  She is already on aspirin and Plavix.  Will try medication therapy with addition of Imdur 30 mg.  She will let us know if recurrent chest pain episode requiring sublingual nitroglycerin.  Likely cardiac catheterization in future greater than stress test given above-mentioned abnormality. -Continue aspirin and Plavix -Continue Lipitor -Continue Toprol-XL  2. HTN -Continue current medication  3. HLD -09/12/2020: Cholesterol 112; HDL 48.80; Triglycerides 244.0; VLDL 48.8  -Continue Lipitor 80 mg daily and Zetia 10 mg daily   Medication Adjustments/Labs and Tests Ordered: Current medicines are reviewed at length with the patient today.  Concerns regarding medicines are outlined above.  Orders Placed This Encounter  Procedures   EKG 12-Lead    Meds ordered this encounter  Medications   isosorbide mononitrate (IMDUR) 30 MG 24 hr tablet    Sig: Take 1 tablet (30 mg total) by mouth daily.    Dispense:  90 tablet    Refill:  3     Patient Instructions  Medication Instructions:   START Imdur 30 mg taking 1 tablet daily    *If you need a refill on your cardiac medications before your next appointment, please call your pharmacy*   Lab Work: None ordered  If you have labs (blood work) drawn today and your tests are completely normal, you will receive your results only by: [place] (if you have MyChart) OR A paper copy in the mail If you  have any lab test that is abnormal or we need to change your treatment, we will call you to review the results.   Testing/Procedures: None ordered   Follow-Up: At [facility], you and your health needs are our priority.  As part of our continuing mission to provide you with exceptional heart care, we have created designated Provider Care Teams.  These Care Teams include your primary Cardiologist (physician) and Advanced Practice Providers (APPs -  Physician Assistants and Nurse Practitioners) who all work together to provide you with the care you need, when you need it.  We recommend signing up for the patient portal called "MyChart".  Sign up information is provided on this After Visit Summary.  MyChart is used to connect with patients for Virtual Visits (Telemedicine).  Patients are able to view lab/test results, encounter notes, upcoming appointments, etc.  Non-urgent messages can be sent to your provider as well.   To learn more about what you can do with MyChart, go to [email or web address].    Your next appointment:   06/23/2021 ARRIVE AT 8:30.  YOU WILL SEE SCOTT WEAVER, PA-C, ON A DAY DR. Burt [name] IS HERE  The format for your next appointment:   In Person  Provider:   You may see [name] Mocha, MD or one of the following Advanced Practice Providers on your designated Care Team:   Richardson Dopp, PA-C Robbie Lis, PA-C   Other Instructions    Signed, Leanor Kail, [place]  05/17/2021 3:01 PM    [place]

## 2021-05-17 NOTE — Progress Notes (Signed)
[name] [name] is an 80 year old female with a history of symptomatic internal hemorrhoids with bleeding, prior ischemic colitis, history of colon polyps, CAD on Plavix, hypertension, hyperlipidemia, diabetes and CKD who presents for additional hemorrhoidal banding.  She is here alone today and was last seen on 12/01/2020 for hemorrhoidal banding Hemorrhoidal symptoms prior to banding include: Bleeding and prolapse  She did well with the initial banding and stated she found it helpful.  She unfortunately had a mechanical fall about 2 weeks ago and struck her right cheekbone just below her eye.  She had significant bruising.  She was seen in the ER.  CT scan of the head and neck showed no complication.  She has had some pain but it is slowly improving.  Unfortunately her sister has chronic pancreatitis and they have concerned about a pancreatic malignancy.  Her mother also died of pancreatic cancer in November 27, 2008.  She asked about pancreatic cancer screening given her family history.  Current Medications, Allergies, Past Medical History, Past Surgical History, Family History and Social History were reviewed in [employer] record.  BP 138/70 (BP Location: Left Arm, Patient Position: Sitting, Cuff Size: Normal)   Pulse 80   Ht '4\' 11"'$  (1.499 m) Comment: height measured without shoes  Wt 128 lb (58.1 kg)   BMI 25.85 kg/m  Gen: awake, alert, NAD HEENT: anicteric, large raised subcutaneous hematoma inferior to the right eye with bruising extending down the right [place] and into the right neck CV: RRR, no mrg Pulm: CTA b/l Abd: soft, NT/ND, +BS throughout Ext: no c/c/e Neuro: nonfocal   PROCEDURE NOTE:  The patient presents with symptomatic grade 3 internal hemorrhoids, requesting rubber band ligation of his her hemorrhoidal disease.  All risks, benefits and alternative forms of therapy were described and informed consent was obtained.  We discussed the slightly higher risk of post  banding hemorrhage on Plavix.  We also discussed the risk of interrupting Plavix in order to perform hemorrhoidal banding.  She understands the slightly elevated risk of post banding bleeding and wishes to proceed.   The anorectum was pre-medicated with 0.125% nitroglycerin ointment The decision was made to band the RA (LL banded initially) internal hemorrhoid, and the [place] was used to perform band ligation without complication.   Digital anorectal examination was then performed to assure proper positioning of the band, and to adjust the banded tissue as required.  The patient was discharged home without pain or other issues.  Dietary and behavioral recommendations were given and along with follow-up instructions.     The patient will return as scheduled for follow-up and possible additional banding as required. No complications were encountered and the patient tolerated the procedure well.  2.  Family history of pancreatic cancer in mother and sister --we discussed abdominal imaging for screening.  At age 23 if her pancreas is normal she would not require further imaging.  She prefers to know if there is a problem with her pancreas and understands the implications thereof.  We discussed it together today. --CT scan abdomen with contrast pancreas protocol for family history of pancreas cancer

## 2021-05-19 ENCOUNTER — Telehealth: Payer: Self-pay | Admitting: Cardiovascular Disease

## 2021-05-19 NOTE — Telephone Encounter (Signed)
*  STAT* If patient is at the pharmacy, call can be transferred to refill team.   1. Which medications need to be refilled? (please list name of each medication and dose if known) Take 1 tablet (30 mg total) by mouth daily.  2. Which pharmacy/location (including street and city if local pharmacy) is medication to be sent to? [facility] - [place], [place] - [place] [address]  3. Do they need a 30 day or 90 day supply? [place]

## 2021-05-19 NOTE — Telephone Encounter (Signed)
Spoke with patient to notify that her Imdur 30 mg tablets 1 tablet QD was sent to pharmacy on 05-17-21 for a 90 day supply with 3 additional refills. Patient confirmed understanding and said she would contact pharmacy for refills.

## 2021-05-22 ENCOUNTER — Telehealth: Payer: Self-pay | Admitting: Pharmacist

## 2021-05-22 ENCOUNTER — Other Ambulatory Visit: Payer: Self-pay | Admitting: Internal Medicine

## 2021-05-22 DIAGNOSIS — K2100 Gastro-esophageal reflux disease with esophagitis, without bleeding: Secondary | ICD-10-CM

## 2021-05-22 DIAGNOSIS — F418 Other specified anxiety disorders: Secondary | ICD-10-CM

## 2021-05-22 DIAGNOSIS — I2510 Atherosclerotic heart disease of native coronary artery without angina pectoris: Secondary | ICD-10-CM

## 2021-05-22 DIAGNOSIS — E785 Hyperlipidemia, unspecified: Secondary | ICD-10-CM

## 2021-05-22 NOTE — Progress Notes (Signed)
Chronic Care Management Pharmacy Assistant   Name: [name] [name]  MRN: [id number] DOB: Jan 21, 1941  Reason for Encounter: Medication Review    Recent office visits:  05/12/21 [name] Koch, MD (PCP) Need for influenza vaccination  Recent consult visits:  05/17/21 [name] Kail, PA-Cardiology (Unstable angina with CAD) med changes: isosorbide mononitrate 30 mg daily  Hospital visits:  Medication Reconciliation was completed by comparing discharge summary, patient's EMR and Pharmacy list, and upon discussion with patient.  Admitted to the hospital on 05/02/21 due to fall. Discharge date was 05/02/21. Discharged from [facility].     Medications that remain the same after Hospital Discharge:??  -All other medications will remain the same.    Medications: Outpatient Encounter Medications as of 05/22/2021  Medication Sig   Ascorbic Acid (VITAMIN C) 500 MG CAPS Take 500 mg by mouth in the morning and at bedtime.   aspirin EC 81 MG tablet Take 81 mg by mouth every morning.    atorvastatin (LIPITOR) 80 MG tablet TAKE ONE TABLET BY MOUTH EVERYDAY AT BEDTIME   celecoxib (CELEBREX) 50 MG capsule Take 1 capsule (50 mg total) by mouth 2 (two) times daily.   Cinnamon 500 MG TABS Take 1,000 mg by mouth. Once a day   clopidogrel (PLAVIX) 75 MG tablet TAKE ONE TABLET BY MOUTH EVERY MORNING   DEXILANT 60 MG capsule TAKE ONE CAPSULE BY MOUTH EVERY MORNING   dicyclomine (BENTYL) 10 MG capsule TAKE ONE CAPSULE BY MOUTH FOUR TIMES DAILY   diltiazem 2 % GEL Apply to the rectum twice daily x 4 weeks   ezetimibe (ZETIA) 10 MG tablet TAKE ONE TABLET BY MOUTH EVERY MORNING   gabapentin (NEURONTIN) 300 MG capsule TAKE TWO CAPSULES BY MOUTH EVERY MORNING and TAKE TWO CAPSULES BY MOUTH EVERYDAY AT BEDTIME   isosorbide mononitrate (IMDUR) 30 MG 24 hr tablet Take 1 tablet (30 mg total) by mouth daily.   Krill Oil 1000 MG CAPS Take 1,000 mg by mouth daily.   magnesium oxide  (MAG-OX) 400 MG tablet Take 400 mg by mouth daily.   metoprolol succinate (TOPROL-XL) 50 MG 24 hr tablet Take 1 tablet (50 mg total) by mouth daily.   Misc Natural Products (FOCUSED MIND PO) Take 15 mg by mouth 1 day or 1 dose.   nitroGLYCERIN (NITROSTAT) 0.4 MG SL tablet Place 1 tablet (0.4 mg total) under the tongue every 5 (five) minutes as needed for chest pain.   omega-3 acid ethyl esters (LOVAZA) 1 g capsule Take 1 g by mouth 2 (two) times daily.   Psyllium (DAILY FIBER PO) Take by mouth in the morning and at bedtime.   saccharomyces boulardii (FLORASTOR) 250 MG capsule Take 250 mg by mouth 2 (two) times daily.   TURMERIC PO Take 1,000 mg by mouth. One tablet po twice a day   venlafaxine XR (EFFEXOR-XR) 37.5 MG 24 hr capsule TAKE ONE CAPSULE BY MOUTH EVERY MORNING   Vitamin D, Ergocalciferol, (DRISDOL) 1.25 MG (50000 UNIT) CAPS capsule Take 1 capsule (50,000 Units total) by mouth every 7 (seven) days.   vitamin E 1000 UNIT capsule Take 1,000 Units by mouth in the morning and at bedtime.   No facility-administered encounter medications on file as of 05/22/2021.   Reviewed chart for medication changes ahead of medication coordination call.  No OVs, Consults, or hospital visits since last care coordination call/Pharmacist visit. (If appropriate, list visit date, provider name)  No medication changes indicated OR if recent visit,  treatment plan here.  BP Readings from Last 3 Encounters:  05/17/21 140/80  05/16/21 138/70  05/12/21 126/80    Lab Results  Component Value Date   HGBA1C 7.1 (H) 12/22/2020    Attempted contact with [name] [name] 3 times on 05/22/21,05/23/21 and 05/24/21. Unsuccessful outreach. Patient mailbox not set up could not leave message.   Patient is due for a delivery on 05/31/21 for the following meds: Gabapentin 300 mg Metoprolol succ 50 mg Dicyclomine 10 mg Atorvastatin 80 mg Dexlansoprazole 60 mg Ezetimibe 10 mg Venlafaxine 37.5 mg Clopidogrel 75 mg   Tramadol 50 mg Isosorbide 30 mg  [name] [name] Clinical Pharmacist Assistant [phone]   Time spent:

## 2021-05-25 ENCOUNTER — Other Ambulatory Visit: Payer: Self-pay

## 2021-05-25 ENCOUNTER — Ambulatory Visit (HOSPITAL_COMMUNITY)
Admission: RE | Admit: 2021-05-25 | Discharge: 2021-05-25 | Disposition: A | Discharge status: Home / Self Care | Payer: PPO | Source: Ambulatory Visit | Attending: Internal Medicine | Admitting: Internal Medicine

## 2021-05-25 DIAGNOSIS — R918 Other nonspecific abnormal finding of lung field: Secondary | ICD-10-CM | POA: Insufficient documentation

## 2021-05-25 DIAGNOSIS — K648 Other hemorrhoids: Secondary | ICD-10-CM | POA: Diagnosis not present

## 2021-05-25 DIAGNOSIS — Z800 Family history of malignant neoplasm of digestive organs: Secondary | ICD-10-CM

## 2021-05-25 DIAGNOSIS — I700 Atherosclerosis of aorta: Secondary | ICD-10-CM | POA: Insufficient documentation

## 2021-05-25 DIAGNOSIS — Z1289 Encounter for screening for malignant neoplasm of other sites: Secondary | ICD-10-CM | POA: Insufficient documentation

## 2021-05-25 DIAGNOSIS — R911 Solitary pulmonary nodule: Secondary | ICD-10-CM | POA: Diagnosis not present

## 2021-05-25 DIAGNOSIS — Z9049 Acquired absence of other specified parts of digestive tract: Secondary | ICD-10-CM | POA: Diagnosis not present

## 2021-05-25 DIAGNOSIS — Z8507 Personal history of malignant neoplasm of pancreas: Secondary | ICD-10-CM | POA: Diagnosis not present

## 2021-05-25 MED ORDER — IOHEXOL 350 MG/ML SOLN
80.0000 mL | Freq: Once | INTRAVENOUS | Status: AC | PRN
  Administered 2021-05-25: 80 mL via INTRAVENOUS

## 2021-06-01 ENCOUNTER — Other Ambulatory Visit: Payer: Self-pay | Admitting: Internal Medicine

## 2021-06-01 DIAGNOSIS — M159 Polyosteoarthritis, unspecified: Secondary | ICD-10-CM

## 2021-06-01 NOTE — Progress Notes (Signed)
    Chronic Care Management Pharmacy Assistant   Name: [name] [name]  MRN: [id number] DOB: 04/21/1941   Reason for Encounter: Medication Review    Medications: Outpatient Encounter Medications as of 05/22/2021  Medication Sig   Ascorbic Acid (VITAMIN C) 500 MG CAPS Take 500 mg by mouth in the morning and at bedtime.   aspirin EC 81 MG tablet Take 81 mg by mouth every morning.    atorvastatin (LIPITOR) 80 MG tablet TAKE ONE TABLET BY MOUTH EVERYDAY AT BEDTIME   celecoxib (CELEBREX) 50 MG capsule Take 1 capsule (50 mg total) by mouth 2 (two) times daily.   Cinnamon 500 MG TABS Take 1,000 mg by mouth. Once a day   clopidogrel (PLAVIX) 75 MG tablet TAKE ONE TABLET BY MOUTH EVERY MORNING   DEXILANT 60 MG capsule TAKE ONE CAPSULE BY MOUTH EVERY MORNING   dicyclomine (BENTYL) 10 MG capsule TAKE ONE CAPSULE BY MOUTH FOUR TIMES DAILY   diltiazem 2 % GEL Apply to the rectum twice daily x 4 weeks   ezetimibe (ZETIA) 10 MG tablet TAKE ONE TABLET BY MOUTH EVERY MORNING   gabapentin (NEURONTIN) 300 MG capsule TAKE TWO CAPSULES BY MOUTH EVERY MORNING and TAKE TWO CAPSULES BY MOUTH EVERYDAY AT BEDTIME   isosorbide mononitrate (IMDUR) 30 MG 24 hr tablet Take 1 tablet (30 mg total) by mouth daily.   Krill Oil 1000 MG CAPS Take 1,000 mg by mouth daily.   magnesium oxide (MAG-OX) 400 MG tablet Take 400 mg by mouth daily.   metoprolol succinate (TOPROL-XL) 50 MG 24 hr tablet Take 1 tablet (50 mg total) by mouth daily.   Misc Natural Products (FOCUSED MIND PO) Take 15 mg by mouth 1 day or 1 dose.   nitroGLYCERIN (NITROSTAT) 0.4 MG SL tablet Place 1 tablet (0.4 mg total) under the tongue every 5 (five) minutes as needed for chest pain.   omega-3 acid ethyl esters (LOVAZA) 1 g capsule Take 1 g by mouth 2 (two) times daily.   Psyllium (DAILY FIBER PO) Take by mouth in the morning and at bedtime.   saccharomyces boulardii (FLORASTOR) 250 MG capsule Take 250 mg by mouth 2 (two) times daily.   TURMERIC PO Take  1,000 mg by mouth. One tablet po twice a day   venlafaxine XR (EFFEXOR-XR) 37.5 MG 24 hr capsule TAKE ONE CAPSULE BY MOUTH EVERY MORNING   Vitamin D, Ergocalciferol, (DRISDOL) 1.25 MG (50000 UNIT) CAPS capsule Take 1 capsule (50,000 Units total) by mouth every 7 (seven) days.   vitamin E 1000 UNIT capsule Take 1,000 Units by mouth in the morning and at bedtime.   No facility-administered encounter medications on file as of 05/22/2021.   Received call from patient regarding medication management via [facility].  Patient requested an acute fill for celecoxib to be delivered: 06/02/21 Pharmacy needs refills? Yes, request sent by [facility]  Confirmed delivery date of 06/02/21, advised patient that pharmacy will contact them the morning of delivery.  Ethelene Hal Clinical Pharmacist Assistant [phone]   Time spent:21:17

## 2021-06-22 NOTE — Progress Notes (Signed)
Cardiology Office Note:    Date:  06/23/2021   ID:  [name] [name], DOB 1941-08-09, MRN [id number]  PCP:  [name] Lima, MD   [name] [name] Providers Cardiologist:  [name] Mocha, MD Cardiology APP:  [name] [name]     Referring MD: [name] Lima, MD   Chief Complaint:  No chief complaint on file.    Patient Profile:   [name] [name] is a 80 y.o. 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 Hypertension  Hyperlipidemia  Diabetes mellitus   Prior CV studies: Cardiac catheterization 18-Mar-2020 RCA proximal 95, mid stents patent with 10 ISR LCx proximal 30 LAD proximal 50, mid 30 PCI: 2.75 x 18 mm Resolute Onyx DES to the proximal RCA  Myoview 11/12/2019 EF > 65, normal perfusion (no ischemia or infarction)   Echocardiogram 05/01/2019 EF 60-65, Gr 2 DD, normal RVSF, mild LAE, mod MAC, trivial MR, mod AoV sclerosis with mobile structure representing Lambl's excrescence    [For studies older than 04/2019 - see my note from 03/22/2020]    History of Present Illness: [name] [name] was last seen in clinic by [name] Kail, PA-C on 05/17/21.  She had a recent fall with facial injury.  She went to the ED and CT was neg for acute intracranial abnormality.  She had symptoms of substernal chest pain radiating to her L shoulder x 3 relieved by NTG.  Cath was avoided due to facial hematoma.  She was started on Isosorbide and brought back today for f/u.  She is here alone.  She has had one other episode of angina for which she took NTG.  She also  has neck pain which is likely an anginal equivalent.  She has been short of breath with exertion as well.  She notes these symptoms over the past 2 mos. Of note, she had several mos without symptoms after her last PCI.  She has not had syncope, orthopnea, leg edema.        Past Medical History:  Diagnosis Date   Allergy    Anal fissure    Anxiety    CAD (coronary artery disease)    s/p inf MI 2007 - tx w/ DES to RCA // s/p POBA to [facility] and DES to [place] in 11/18 ([name] [name] in [place], [place]) // Myoview 3/21: low risk // s/p DES to pRCA   Cataract    removed both eyes   Cerebrovascular disease    Carotid US 09/2017 [facility]): mild plaque (<50%) in both carotid arteries   Chronic neck pain    Chronic pain syndrome    Diabetes Mellitus, Type 2    Diverticular disease    DJD (degenerative joint disease)    Echocardiogram 10/2018    Echo 10/2018: EF 55-60, normal RVSF, mod MAC, mod TR, severe AoV calcification and sclerosis with nodular calcium/mobile area of calcium in the LVOT (small veg vs Lambl's excrescence  - consider TEE), mild AI, mild AS (mean 11).    Echocardiogram 04/2019    Echocardiogram 04/2019: EF 60-65, basal  septal hypertrophy, grade 2 diastolic dysfunction, normal wall motion, normal RV SF, mild LAE, mod MAC, trivial MR, mod sclerosis of the aortic valve with mod aortic annular calcification, thin mobile filamentous structure on ventricular side of AV likely representing Lambl's excrescence, mild AI, mild TR   Frequent UTI's    Gastroesophageal reflux disease    H/O bacterial endocarditis    Rx w IV Abxs in 2020 ([facility] in [place], [place]) // F/u echo with AoV Lambl's excrescence   H/O hiatal hernia    HTN (hypertension)    Hx of fall 10/2020   Hx of MI 2007   Hx of Stroke    IBS (irritable bowel syndrome)    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 (Cervical Fusion)   Premature ventricular contractions    Tubular adenoma of colon    Ulcer    sam [name] [name]   Urinary incontinence    Current Medications: Current Meds  Medication Sig   amLODipine (NORVASC) 5 MG tablet Take 1 tablet (5 mg total) by mouth daily.   Ascorbic Acid (VITAMIN C) 500 MG CAPS Take 500 mg by mouth in the morning and at bedtime.   aspirin EC 81 MG tablet Take 81 mg by mouth every morning.    atorvastatin (LIPITOR) 80 MG tablet TAKE ONE TABLET BY MOUTH EVERYDAY AT BEDTIME   celecoxib (CELEBREX) 50 MG capsule TAKE ONE CAPSULE BY MOUTH twice daily   Cinnamon 500 MG TABS Take 1,000 mg by mouth. Once a day   clopidogrel (PLAVIX) 75 MG tablet TAKE ONE TABLET BY MOUTH EVERY MORNING   DEXILANT 60 MG capsule TAKE ONE CAPSULE BY MOUTH EVERY MORNING   dicyclomine (BENTYL) 10 MG capsule TAKE ONE CAPSULE BY MOUTH FOUR TIMES DAILY   ezetimibe (ZETIA) 10 MG tablet TAKE ONE TABLET BY MOUTH EVERY MORNING   gabapentin (NEURONTIN) 300 MG capsule TAKE TWO CAPSULES BY MOUTH EVERY MORNING and TAKE TWO CAPSULES BY MOUTH EVERYDAY AT BEDTIME   isosorbide mononitrate (IMDUR) 30 MG 24 hr tablet Take 1 tablet (30 mg total) by mouth daily.   Krill Oil 1000 MG CAPS Take 1,000 mg by mouth daily.   magnesium oxide (MAG-OX) 400 MG tablet Take 400 mg by mouth daily.   metoprolol succinate (TOPROL-XL) 50 MG 24 hr tablet Take 1 tablet (50 mg total) by mouth daily.   Misc Natural Products (FOCUSED MIND PO) Take 15 mg by mouth 1 day or 1 dose.   nitroGLYCERIN (NITROSTAT) 0.4 MG SL tablet Place 1 tablet (0.4 mg total) under the tongue every 5 (five) minutes as needed for chest pain.   omega-3 acid ethyl esters (LOVAZA) 1 g capsule Take 1 g by mouth 2 (two) times daily.   Psyllium (DAILY FIBER PO) Take by mouth in the morning and at  bedtime.   saccharomyces boulardii (FLORASTOR) 250 MG capsule Take 250 mg by mouth 2 (two) times daily.   TURMERIC PO Take 1,000 mg by mouth. One tablet po twice a day   venlafaxine XR (EFFEXOR-XR) 37.5 MG 24 hr capsule TAKE ONE CAPSULE BY MOUTH EVERY MORNING   Vitamin D, Ergocalciferol, (DRISDOL)  1.25 MG (50000 UNIT) CAPS capsule Take 1 capsule (50,000 Units total) by mouth every 7 (seven) days.   vitamin E 1000 UNIT capsule Take 1,000 Units by mouth in the morning and at bedtime.    Allergies:   Nitrofurantoin, Niacin and related, Ceftriaxone, Codeine, Prednisone, Sulfonamide derivatives, and Tetracycline   Social History   Tobacco Use   Smoking status: Never   Smokeless tobacco: Never  Vaping Use   Vaping Use: Never used  Substance Use Topics   Alcohol use: No   Drug use: No    Family Hx: The patient's family history includes Cancer - Other in her brother; Colon polyps in her sister; Coronary artery disease in some other family members; Heart disease in her mother; Lung cancer in her brother; Pancreatic cancer in her mother. There is no history of Colon cancer, Esophageal cancer, Rectal cancer, or Stomach cancer.  Review of Systems  Constitutional: Negative for fever.  Respiratory:  Negative for cough.   Gastrointestinal:  Negative for hematochezia.  Genitourinary:  Negative for hematuria.  All other systems reviewed and are negative.   EKGs/Labs/Other Test Reviewed:    EKG:  EKG is   ordered today.  The ekg ordered today demonstrates NSR, HR 82, inferior Q waves, poor R wave progression, QTC 427, no ST-T wave changes  Recent Labs: 09/12/2020: TSH 1.54 05/02/2021: ALT 24; BUN 17; Creatinine, Ser 0.70; Hemoglobin 13.9; Platelets 227; Potassium 5.0; Sodium 137   Recent Lipid Panel Lab Results  Component Value Date/Time   CHOL 112 09/12/2020 03:16 PM   TRIG 244.0 (H) 09/12/2020 03:16 PM   TRIG 164 (H) 06/24/2006 07:38 AM   HDL 48.80 09/12/2020 03:16 PM   LDLCALC 26  04/30/2019 11:34 AM   LDLDIRECT 42.0 09/12/2020 03:16 PM     Risk Assessment/Calculations:          Physical Exam:    VS:  BP (!) 170/80   Pulse 60   Ht 5' (1.524 m)   Wt 125 lb 9.6 oz (57 kg)   SpO2 96%   BMI 24.53 kg/m     Wt Readings from Last 3 Encounters:  06/23/21 125 lb 9.6 oz (57 kg)  05/17/21 127 lb 9.6 oz (57.9 kg)  05/16/21 128 lb (58.1 kg)    Constitutional:      Appearance: Healthy appearance. Not in distress.  Neck:     Vascular: JVD normal.  Pulmonary:     Effort: Pulmonary effort is normal.     Breath sounds: No wheezing. No rales.  Cardiovascular:     Normal rate. Regular rhythm. Normal S1. Normal S2.      Murmurs: There is no murmur.     Comments: No FA bruits bilat Pulses:    Intact distal pulses.  Edema:    Peripheral edema absent.  Abdominal:     Palpations: Abdomen is soft. There is no hepatomegaly.  Skin:    General: Skin is warm and dry.  Neurological:     Mental Status: Alert and oriented to person, place and time.     Cranial Nerves: Cranial nerves are intact.     ASSESSMENT & PLAN:   1. Coronary artery disease involving native coronary artery of native heart with angina pectoris ([place]) Hx of inferior MI in 2007 tx with DES to the RCA.  She underwent POBA to the mid RCA stent and DES to the distal RCA (overlapping with the prior stent) in 07/2017 in [place].  Most recently she underwent PCI with  DES to the RCA in 7/21.  She has had recurrent anginal symptoms in the last couple of mos.  EKG today shows no acute changes.  Given her recurrent symptoms and the fact that she had a low risk Myoview prior to her PCI last year, I have recommended proceeding with cardiac catheterization.  I d/w Dr. Burt Knack who agreed.  Continue Isosorbide 30 mg once daily, Metoprolol succinate 50 mg once daily, clopidogrel 75 mg once daily, ASA 81 mg once daily, Atorvastatin 80 mg once daily.  Restart amlodipine 5 mg daily as outlined below.  Follow-up 2 weeks post  catheterization.  2. Essential hypertension BP uncontrolled.  Continue Isosorbide 30 mg once daily, Metoprolol succinate 50 mg once daily.  She was previously on Amlodipine.  She is not sure why this was stopped and denies any previous intolerance/allergy.  Restart Amlodipine at 5 mg once daily.    3. Mixed hyperlipidemia LDL optimal.  Continue Atorvastatin 80 mg once daily.    Shared Decision Making/Informed Consent The risks [stroke (1 in 1000), death (1 in 1000), kidney failure [usually temporary] (1 in 500), bleeding (1 in 200), allergic reaction [possibly serious] (1 in 200)], benefits (diagnostic support and management of coronary artery disease) and alternatives of a cardiac catheterization were discussed in detail with Ms. Kantz and she is willing to proceed.   Dispo:  Return for Post procedure follow up..   Medication Adjustments/Labs and Tests Ordered: Current medicines are reviewed at length with the patient today.  Concerns regarding medicines are outlined above.  Tests Ordered: Orders Placed This Encounter  Procedures   CBC   Basic Metabolic Panel (BMET)   EKG 12-Lead   Medication Changes: Meds ordered this encounter  Medications   amLODipine (NORVASC) 5 MG tablet    Sig: Take 1 tablet (5 mg total) by mouth daily.    Dispense:  90 tablet    Refill:  3   Signed, Richardson Dopp, PA-C  06/23/2021 10:55 AM    [facility] [address], [place], [place]  [address] Phone: [phone]; Fax: [phone]

## 2021-06-22 NOTE — H&P (View-Only) (Signed)
Cardiology Office Note:    Date:  06/23/2021   ID:  [name] [name], DOB 1941-08-09, MRN [id number]  PCP:  Janith Lima, MD   [facility] Cardiologist:  Sherren Mocha, MD Cardiology APP:  Sh[facility]vere     Referring MD: Janith Lima, MD   Chief Complaint:  No chief complaint on file.    Patient Profile:   [name] [name] is a 80 y.o. 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 [name] was last seen in clinic by Leanor Kail, PA-C on 05/17/21.  She had a recent fall with facial injury.  She went to the ED and CT was neg for acute intracranial abnormality.  She had symptoms of substernal chest pain radiating to her L shoulder x 3 relieved by NTG.  Cath was avoided due to facial hematoma.  She was started on Isosorbide and brought back today for f/u.  She is here alone.  She has had one other episode of angina for which she took NTG.  She also  has neck pain which is likely an anginal equivalent.  She has been short of breath with exertion as well.  She notes these symptoms over the past 2 mos. Of note, she had several mos without symptoms after her last PCI.  She has not had syncope, orthopnea, leg edema.        Past Medical History:  Diagnosis Date   Allergy    Anal fissure    Anxiety    CAD (coronary artery disease)    s/p inf MI 2007 - tx w/ DES to RCA // s/p POBA to [facility] and DES to [place] in 11/18 ([facility] in [place], [place]) // Myoview 3/21: low risk // s/p DES to pRCA   Cataract    removed both eyes   Cerebrovascular disease    Carotid US 09/2017 [facility]): mild plaque (<50%) in both carotid arteries   Chronic neck pain    Chronic pain syndrome    Diabetes Mellitus, Type 2    Diverticular disease    DJD (degenerative joint disease)    Echocardiogram 10/2018    Echo 10/2018: EF 55-60, normal RVSF, mod MAC, mod TR, severe AoV calcification and sclerosis with nodular calcium/mobile area of calcium in the LVOT (small veg vs Lambl's excrescence  - consider TEE), mild AI, mild AS (mean 11).    Echocardiogram 04/2019    Echocardiogram 04/2019: EF 60-65, basal  septal hypertrophy, grade 2 diastolic dysfunction, normal wall motion, normal RV SF, mild LAE, mod MAC, trivial MR, mod sclerosis of the aortic valve with mod aortic annular calcification, thin mobile filamentous structure on ventricular side of AV likely representing Lambl's excrescence, mild AI, mild TR   Frequent UTI's    Gastroesophageal reflux disease    H/O bacterial endocarditis    Rx w IV Abxs in 2020 ([facility] in [place], [place]) // F/u echo with AoV Lambl's excrescence   H/O hiatal hernia    HTN (hypertension)    Hx of fall 10/2020   Hx of MI 2007   Hx of Stroke    IBS (irritable bowel syndrome)    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 (Cervical Fusion)   Premature ventricular contractions    Tubular adenoma of colon    Ulcer    [facility]   Urinary incontinence    Current Medications: Current Meds  Medication Sig   amLODipine (NORVASC) 5 MG tablet Take 1 tablet (5 mg total) by mouth daily.   Ascorbic Acid (VITAMIN C) 500 MG CAPS Take 500 mg by mouth in the morning and at bedtime.   aspirin EC 81 MG tablet Take 81 mg by mouth every morning.    atorvastatin (LIPITOR) 80 MG tablet TAKE ONE TABLET BY MOUTH EVERYDAY AT BEDTIME   celecoxib (CELEBREX) 50 MG capsule TAKE ONE CAPSULE BY MOUTH twice daily   Cinnamon 500 MG TABS Take 1,000 mg by mouth. Once a day   clopidogrel (PLAVIX) 75 MG tablet TAKE ONE TABLET BY MOUTH EVERY MORNING   DEXILANT 60 MG capsule TAKE ONE CAPSULE BY MOUTH EVERY MORNING   dicyclomine (BENTYL) 10 MG capsule TAKE ONE CAPSULE BY MOUTH FOUR TIMES DAILY   ezetimibe (ZETIA) 10 MG tablet TAKE ONE TABLET BY MOUTH EVERY MORNING   gabapentin (NEURONTIN) 300 MG capsule TAKE TWO CAPSULES BY MOUTH EVERY MORNING and TAKE TWO CAPSULES BY MOUTH EVERYDAY AT BEDTIME   isosorbide mononitrate (IMDUR) 30 MG 24 hr tablet Take 1 tablet (30 mg total) by mouth daily.   Krill Oil 1000 MG CAPS Take 1,000 mg by mouth daily.   magnesium oxide (MAG-OX) 400 MG tablet Take 400 mg by mouth daily.   metoprolol succinate (TOPROL-XL) 50 MG 24 hr tablet Take 1 tablet (50 mg total) by mouth daily.   Misc Natural Products (FOCUSED MIND PO) Take 15 mg by mouth 1 day or 1 dose.   nitroGLYCERIN (NITROSTAT) 0.4 MG SL tablet Place 1 tablet (0.4 mg total) under the tongue every 5 (five) minutes as needed for chest pain.   omega-3 acid ethyl esters (LOVAZA) 1 g capsule Take 1 g by mouth 2 (two) times daily.   Psyllium (DAILY FIBER PO) Take by mouth in the morning and at  bedtime.   saccharomyces boulardii (FLORASTOR) 250 MG capsule Take 250 mg by mouth 2 (two) times daily.   TURMERIC PO Take 1,000 mg by mouth. One tablet po twice a day   venlafaxine XR (EFFEXOR-XR) 37.5 MG 24 hr capsule TAKE ONE CAPSULE BY MOUTH EVERY MORNING   Vitamin D, Ergocalciferol, (DRISDOL)  1.25 MG (50000 UNIT) CAPS capsule Take 1 capsule (50,000 Units total) by mouth every 7 (seven) days.   vitamin E 1000 UNIT capsule Take 1,000 Units by mouth in the morning and at bedtime.    Allergies:   Nitrofurantoin, Niacin and related, Ceftriaxone, Codeine, Prednisone, Sulfonamide derivatives, and Tetracycline   Social History   Tobacco Use   Smoking status: Never   Smokeless tobacco: Never  Vaping Use   Vaping Use: Never used  Substance Use Topics   Alcohol use: No   Drug use: No    Family Hx: The patient's family history includes Cancer - Other in her brother; Colon polyps in her sister; Coronary artery disease in some other family members; Heart disease in her mother; Lung cancer in her brother; Pancreatic cancer in her mother. There is no history of Colon cancer, Esophageal cancer, Rectal cancer, or Stomach cancer.  Review of Systems  Constitutional: Negative for fever.  Respiratory:  Negative for cough.   Gastrointestinal:  Negative for hematochezia.  Genitourinary:  Negative for hematuria.  All other systems reviewed and are negative.   EKGs/Labs/Other Test Reviewed:    EKG:  EKG is   ordered today.  The ekg ordered today demonstrates NSR, HR 82, inferior Q waves, poor R wave progression, QTC 427, no ST-T wave changes  Recent Labs: 09/12/2020: TSH 1.54 05/02/2021: ALT 24; BUN 17; Creatinine, Ser 0.70; Hemoglobin 13.9; Platelets 227; Potassium 5.0; Sodium 137   Recent Lipid Panel Lab Results  Component Value Date/Time   CHOL 112 09/12/2020 03:16 PM   TRIG 244.0 (H) 09/12/2020 03:16 PM   TRIG 164 (H) 06/24/2006 07:38 AM   HDL 48.80 09/12/2020 03:16 PM   LDLCALC 26  04/30/2019 11:34 AM   LDLDIRECT 42.0 09/12/2020 03:16 PM     Risk Assessment/Calculations:          Physical Exam:    VS:  BP (!) 170/80   Pulse 60   Ht 5' (1.524 m)   Wt 125 lb 9.6 oz (57 kg)   SpO2 96%   BMI 24.53 kg/m     Wt Readings from Last 3 Encounters:  06/23/21 125 lb 9.6 oz (57 kg)  05/17/21 127 lb 9.6 oz (57.9 kg)  05/16/21 128 lb (58.1 kg)    Constitutional:      Appearance: Healthy appearance. Not in distress.  Neck:     Vascular: JVD normal.  Pulmonary:     Effort: Pulmonary effort is normal.     Breath sounds: No wheezing. No rales.  Cardiovascular:     Normal rate. Regular rhythm. Normal S1. Normal S2.      Murmurs: There is no murmur.     Comments: No FA bruits bilat Pulses:    Intact distal pulses.  Edema:    Peripheral edema absent.  Abdominal:     Palpations: Abdomen is soft. There is no hepatomegaly.  Skin:    General: Skin is warm and dry.  Neurological:     Mental Status: Alert and oriented to person, place and time.     Cranial Nerves: Cranial nerves are intact.     ASSESSMENT & PLAN:   1. Coronary artery disease involving native coronary artery of native heart with angina pectoris ([place]) Hx of inferior MI in 2007 tx with DES to the RCA.  She underwent POBA to the mid RCA stent and DES to the distal RCA (overlapping with the prior stent) in 07/2017 in [place].  Most recently she underwent PCI with  DES to the RCA in 7/21.  She has had recurrent anginal symptoms in the last couple of mos.  EKG today shows no acute changes.  Given her recurrent symptoms and the fact that she had a low risk Myoview prior to her PCI last year, I have recommended proceeding with cardiac catheterization.  I d/w Dr. Burt Knack who agreed.  Continue Isosorbide 30 mg once daily, Metoprolol succinate 50 mg once daily, clopidogrel 75 mg once daily, ASA 81 mg once daily, Atorvastatin 80 mg once daily.  Restart amlodipine 5 mg daily as outlined below.  Follow-up 2 weeks post  catheterization.  2. Essential hypertension BP uncontrolled.  Continue Isosorbide 30 mg once daily, Metoprolol succinate 50 mg once daily.  She was previously on Amlodipine.  She is not sure why this was stopped and denies any previous intolerance/allergy.  Restart Amlodipine at 5 mg once daily.    3. Mixed hyperlipidemia LDL optimal.  Continue Atorvastatin 80 mg once daily.    Shared Decision Making/Informed Consent The risks [stroke (1 in 1000), death (1 in 1000), kidney failure [usually temporary] (1 in 500), bleeding (1 in 200), allergic reaction [possibly serious] (1 in 200)], benefits (diagnostic support and management of coronary artery disease) and alternatives of a cardiac catheterization were discussed in detail with Ms. Allum and she is willing to proceed.   Dispo:  Return for Post procedure follow up..   Medication Adjustments/Labs and Tests Ordered: Current medicines are reviewed at length with the patient today.  Concerns regarding medicines are outlined above.  Tests Ordered: Orders Placed This Encounter  Procedures   CBC   Basic Metabolic Panel (BMET)   EKG 12-Lead   Medication Changes: Meds ordered this encounter  Medications   amLODipine (NORVASC) 5 MG tablet    Sig: Take 1 tablet (5 mg total) by mouth daily.    Dispense:  90 tablet    Refill:  3   Signed, Richardson Dopp, PA-C  06/23/2021 10:55 AM    [facility] [address], [place], [place]  [address] Phone: [phone]; Fax: [phone]

## 2021-06-23 ENCOUNTER — Encounter: Payer: Self-pay | Admitting: Physician Assistant

## 2021-06-23 ENCOUNTER — Other Ambulatory Visit: Payer: Self-pay

## 2021-06-23 ENCOUNTER — Ambulatory Visit: Payer: PPO | Admitting: Physician Assistant

## 2021-06-23 VITALS — BP 170/80 | HR 60 | Ht 60.0 in | Wt 125.6 lb

## 2021-06-23 DIAGNOSIS — E782 Mixed hyperlipidemia: Secondary | ICD-10-CM

## 2021-06-23 DIAGNOSIS — I10 Essential (primary) hypertension: Secondary | ICD-10-CM | POA: Diagnosis not present

## 2021-06-23 DIAGNOSIS — I25119 Atherosclerotic heart disease of native coronary artery with unspecified angina pectoris: Secondary | ICD-10-CM

## 2021-06-23 LAB — CBC
Hematocrit: 43.1 % (ref 34.0–46.6)
Hemoglobin: 14.2 g/dL (ref 11.1–15.9)
MCH: 31.6 pg (ref 26.6–33.0)
MCHC: 32.9 g/dL (ref 31.5–35.7)
MCV: 96 fL (ref 79–97)
Platelets: 248 10*3/uL (ref 150–450)
RBC: 4.5 x10E6/uL (ref 3.77–5.28)
RDW: 12.1 % (ref 11.7–15.4)
WBC: 9.5 10*3/uL (ref 3.4–10.8)

## 2021-06-23 LAB — BASIC METABOLIC PANEL
BUN/Creatinine Ratio: 19 (ref 12–28)
BUN: 16 mg/dL (ref 8–27)
CO2: 30 mmol/L — ABNORMAL HIGH (ref 20–29)
Calcium: 9.7 mg/dL (ref 8.7–10.3)
Chloride: 97 mmol/L (ref 96–106)
Creatinine, Ser: 0.86 mg/dL (ref 0.57–1.00)
Glucose: 152 mg/dL — ABNORMAL HIGH (ref 70–99)
Potassium: 5 mmol/L (ref 3.5–5.2)
Sodium: 142 mmol/L (ref 134–144)
eGFR: 68 mL/min/{1.73_m2} (ref >59)

## 2021-06-23 MED ORDER — AMLODIPINE BESYLATE 5 MG PO TABS: 5.0000 mg | ORAL_TABLET | Freq: Every day | ORAL | 3 refills | Status: DC

## 2021-06-23 NOTE — Patient Instructions (Signed)
Medication Instructions:   START Amlodipine one tablet by mouth ( 5 mg) daily.  *If you need a refill on your cardiac medications before your next appointment, please call your pharmacy*   Lab Work: TODAY!!!! BMET/CBC  If you have labs (blood work) drawn today and your tests are completely normal, you will receive your results only by: [place] (if you have MyChart) OR A paper copy in the mail If you have any lab test that is abnormal or we need to change your treatment, we will call you to review the results.   Testing/Procedures:   [facility] OFF[facility] [place], [address] [place] [address] Dept: [phone] Loc: [place]  06/23/2021  You are scheduled for a Cardiac Catheterization on Wednesday, October 26 with Dr. Lauree Chandler.  1. Please arrive at the [facility] (Main Entrance A) at [facility]: [address] [place], [place] [address] at 8:00 AM (This time is two hours before your procedure to ensure your preparation). Free valet parking service is available.   Special note: Every effort is made to have your procedure done on time. Please understand that emergencies sometimes delay scheduled procedures.  2. Diet: Do not eat solid foods after midnight.  The patient may have clear liquids until 5am upon the day of the procedure.  3. Labs: You will need to have blood drawn on TODAY at [facility] at [facility]. [address]  Open: 7:30am - 5pm    Phone: [phone]. You do not need to be fasting.  4. Medication instructions in preparation for your procedure:   Contrast Allergy: No   On the morning of your procedure, take your Aspirin and Plavix and any morning medicines NOT listed above.  You may use sips of water.  5. Plan for one night stay--bring personal belongings. 6. Bring a current list of your  medications and current insurance cards. 7. You MUST have a responsible person to drive you home. 8. Someone MUST be with you the first 24 hours after you arrive home or your discharge will be delayed. 9. Please wear clothes that are easy to get on and off and wear slip-on shoes.  Thank you for allowing [place] to care for you!   -- [facility]    Follow-Up: At [facility], you and your health needs are our priority.  As part of our continuing mission to provide you with exceptional heart care, we have created designated Provider Care Teams.  These Care Teams include your primary Cardiologist (physician) and Advanced Practice Providers (APPs -  Physician Assistants and Nurse Practitioners) who all work together to provide you with the care you need, when you need it.  We recommend signing up for the patient portal called "MyChart".  Sign up information is provided on this After Visit Summary.  MyChart is used to connect with patients for Virtual Visits (Telemedicine).  Patients are able to view lab/test results, encounter notes, upcoming appointments, etc.  Non-urgent messages can be sent to your provider as well.   To learn more about what you can do with MyChart, go to [email or web address].    Your next appointment:   4 week(s)  The format for your next appointment:   In Person  Provider:   Richardson Dopp, PA-C   Other Instructions

## 2021-06-23 NOTE — Assessment & Plan Note (Signed)
[id number]

## 2021-06-27 ENCOUNTER — Telehealth: Payer: Self-pay | Admitting: *Deleted

## 2021-06-27 NOTE — Telephone Encounter (Signed)
Cardiac catheterization scheduled at [facility] for: Wednesday June 28, 2021 [facility] Main Entrance A [facility]) at: 8 AM   No solid food after midnight prior to cath, clear liquids until 5 AM day of procedure.   Usual morning medications can be taken pre-cath with sips of water including: - aspirin 81 mg -Plavix 75 mg    Confirmed patient has responsible adult to drive home post procedure and be with patient first 24 hours after arriving home.  [facility] does allow one visitor to accompany you and wait in the hospital waiting room while you are there for your procedure. You and your visitor will be asked to wear a mask once you enter the hospital.   Patient reports does not currently have any new symptoms concerning for COVID-19 and no household members with COVID-19 like illness.   Reviewed procedure /mask/visitor instructions with patient.

## 2021-06-27 NOTE — Telephone Encounter (Signed)
Entry error

## 2021-06-28 ENCOUNTER — Encounter (HOSPITAL_COMMUNITY)
Admission: RE | Disposition: A | Discharge status: Home / Self Care | Payer: Self-pay | Source: Home / Self Care | Attending: Cardiovascular Disease

## 2021-06-28 ENCOUNTER — Encounter (HOSPITAL_COMMUNITY): Payer: Self-pay | Admitting: Cardiovascular Disease

## 2021-06-28 ENCOUNTER — Observation Stay (HOSPITAL_COMMUNITY)
Admission: RE | Admit: 2021-06-28 | Discharge: 2021-06-29 | Disposition: A | Discharge status: Home / Self Care | Payer: PPO | Attending: Cardiovascular Disease | Admitting: Cardiovascular Disease

## 2021-06-28 ENCOUNTER — Other Ambulatory Visit: Payer: Self-pay

## 2021-06-28 DIAGNOSIS — Z7984 Long term (current) use of oral hypoglycemic drugs: Secondary | ICD-10-CM | POA: Insufficient documentation

## 2021-06-28 DIAGNOSIS — I25119 Atherosclerotic heart disease of native coronary artery with unspecified angina pectoris: Secondary | ICD-10-CM

## 2021-06-28 DIAGNOSIS — Z79899 Other long term (current) drug therapy: Secondary | ICD-10-CM | POA: Insufficient documentation

## 2021-06-28 DIAGNOSIS — Z7982 Long term (current) use of aspirin: Secondary | ICD-10-CM | POA: Diagnosis not present

## 2021-06-28 DIAGNOSIS — E785 Hyperlipidemia, unspecified: Secondary | ICD-10-CM | POA: Diagnosis present

## 2021-06-28 DIAGNOSIS — E119 Type 2 diabetes mellitus without complications: Secondary | ICD-10-CM | POA: Diagnosis not present

## 2021-06-28 DIAGNOSIS — Z955 Presence of coronary angioplasty implant and graft: Secondary | ICD-10-CM

## 2021-06-28 DIAGNOSIS — I25110 Atherosclerotic heart disease of native coronary artery with unstable angina pectoris: Principal | ICD-10-CM

## 2021-06-28 DIAGNOSIS — I2510 Atherosclerotic heart disease of native coronary artery without angina pectoris: Secondary | ICD-10-CM | POA: Diagnosis present

## 2021-06-28 DIAGNOSIS — R079 Chest pain, unspecified: Secondary | ICD-10-CM | POA: Diagnosis present

## 2021-06-28 DIAGNOSIS — E782 Mixed hyperlipidemia: Secondary | ICD-10-CM | POA: Diagnosis present

## 2021-06-28 DIAGNOSIS — I10 Essential (primary) hypertension: Secondary | ICD-10-CM | POA: Diagnosis not present

## 2021-06-28 DIAGNOSIS — I200 Unstable angina: Secondary | ICD-10-CM | POA: Diagnosis present

## 2021-06-28 HISTORY — PX: CORONARY PRESSURE/FFR WITH 3D MAPPING: CATH118309

## 2021-06-28 HISTORY — PX: CORONARY PRESSURE WIRE/FFR WITH 3D MAPPING: CATH118309

## 2021-06-28 HISTORY — PX: LEFT HEART CATH AND CORONARY ANGIOGRAPHY: CATH118249

## 2021-06-28 HISTORY — PX: CORONARY STENT INTERVENTION: CATH118234

## 2021-06-28 LAB — GLUCOSE, CAPILLARY: Glucose-Capillary: 126 mg/dL — ABNORMAL HIGH (ref 70–99)

## 2021-06-28 LAB — POCT ACTIVATED CLOTTING TIME: Activated Clotting Time: 289 seconds

## 2021-06-28 SURGERY — LEFT HEART CATH AND CORONARY ANGIOGRAPHY
Anesthesia: LOCAL

## 2021-06-28 MED ORDER — HYDRALAZINE HCL 20 MG/ML IJ SOLN: 10.0000 mg | INTRAMUSCULAR | Status: AC | PRN

## 2021-06-28 MED ORDER — SODIUM CHLORIDE 0.9% FLUSH: 3.0000 mL | INTRAVENOUS | Status: DC | PRN

## 2021-06-28 MED ORDER — MIDAZOLAM HCL 2 MG/2ML IJ SOLN
INTRAMUSCULAR | Status: DC | PRN
  Administered 2021-06-28 (×2): 1 mg via INTRAVENOUS

## 2021-06-28 MED ORDER — ATROPINE SULFATE 1 MG/10ML IJ SOSY
PREFILLED_SYRINGE | INTRAMUSCULAR | Status: AC
  Filled 2021-06-28: qty 10

## 2021-06-28 MED ORDER — ATORVASTATIN CALCIUM 80 MG PO TABS
80.0000 mg | ORAL_TABLET | Freq: Every day | ORAL | Status: DC
  Administered 2021-06-28: 80 mg via ORAL
  Filled 2021-06-28: qty 1

## 2021-06-28 MED ORDER — LABETALOL HCL 5 MG/ML IV SOLN: 10.0000 mg | INTRAVENOUS | Status: AC | PRN

## 2021-06-28 MED ORDER — VENLAFAXINE HCL ER 37.5 MG PO CP24
37.5000 mg | ORAL_CAPSULE | Freq: Every morning | ORAL | Status: DC
  Administered 2021-06-29: 37.5 mg via ORAL
  Filled 2021-06-28: qty 1

## 2021-06-28 MED ORDER — ASPIRIN EC 81 MG PO TBEC
81.0000 mg | DELAYED_RELEASE_TABLET | Freq: Every morning | ORAL | Status: DC
  Administered 2021-06-29: 81 mg via ORAL
  Filled 2021-06-28: qty 1

## 2021-06-28 MED ORDER — NITROGLYCERIN 1 MG/10 ML FOR IR/CATH LAB
INTRA_ARTERIAL | Status: DC | PRN
  Administered 2021-06-28: 200 ug via INTRACORONARY

## 2021-06-28 MED ORDER — SODIUM CHLORIDE 0.9 % IV SOLN: 250.0000 mL | INTRAVENOUS | Status: DC | PRN

## 2021-06-28 MED ORDER — NITROGLYCERIN 1 MG/10 ML FOR IR/CATH LAB
INTRA_ARTERIAL | Status: AC
  Filled 2021-06-28: qty 10

## 2021-06-28 MED ORDER — LIDOCAINE HCL (PF) 1 % IJ SOLN
INTRAMUSCULAR | Status: AC
  Filled 2021-06-28: qty 30

## 2021-06-28 MED ORDER — HEPARIN SODIUM (PORCINE) 1000 UNIT/ML IJ SOLN
INTRAMUSCULAR | Status: DC | PRN
  Administered 2021-06-28: 7000 [IU] via INTRAVENOUS

## 2021-06-28 MED ORDER — CELECOXIB 50 MG PO CAPS: 50.0000 mg | ORAL_CAPSULE | Freq: Two times a day (BID) | ORAL | Status: DC

## 2021-06-28 MED ORDER — ISOSORBIDE MONONITRATE ER 30 MG PO TB24
30.0000 mg | ORAL_TABLET | Freq: Every day | ORAL | Status: DC
  Administered 2021-06-29: 30 mg via ORAL
  Filled 2021-06-28: qty 1

## 2021-06-28 MED ORDER — LIDOCAINE HCL (PF) 1 % IJ SOLN
INTRAMUSCULAR | Status: DC | PRN
  Administered 2021-06-28: 10 mL

## 2021-06-28 MED ORDER — GABAPENTIN 300 MG PO CAPS
600.0000 mg | ORAL_CAPSULE | Freq: Two times a day (BID) | ORAL | Status: DC
  Administered 2021-06-28 – 2021-06-29 (×2): 600 mg via ORAL
  Filled 2021-06-28 (×2): qty 2

## 2021-06-28 MED ORDER — FENTANYL CITRATE (PF) 100 MCG/2ML IJ SOLN
INTRAMUSCULAR | Status: DC | PRN
  Administered 2021-06-28 (×2): 25 ug via INTRAVENOUS

## 2021-06-28 MED ORDER — IOHEXOL 350 MG/ML SOLN
INTRAVENOUS | Status: DC | PRN
  Administered 2021-06-28: 105 mL

## 2021-06-28 MED ORDER — PANTOPRAZOLE SODIUM 40 MG PO TBEC
40.0000 mg | DELAYED_RELEASE_TABLET | Freq: Every day | ORAL | Status: DC
  Administered 2021-06-29: 40 mg via ORAL
  Filled 2021-06-28: qty 1

## 2021-06-28 MED ORDER — SODIUM CHLORIDE 0.9% FLUSH
3.0000 mL | Freq: Two times a day (BID) | INTRAVENOUS | Status: DC
  Administered 2021-06-28: 3 mL via INTRAVENOUS

## 2021-06-28 MED ORDER — SODIUM CHLORIDE 0.9 % IV SOLN: INTRAVENOUS | Status: AC

## 2021-06-28 MED ORDER — AMLODIPINE BESYLATE 5 MG PO TABS
5.0000 mg | ORAL_TABLET | Freq: Every day | ORAL | Status: DC
  Administered 2021-06-29: 5 mg via ORAL
  Filled 2021-06-28: qty 1

## 2021-06-28 MED ORDER — ASPIRIN 81 MG PO CHEW: 81.0000 mg | CHEWABLE_TABLET | ORAL | Status: DC

## 2021-06-28 MED ORDER — EZETIMIBE 10 MG PO TABS
10.0000 mg | ORAL_TABLET | Freq: Every morning | ORAL | Status: DC
  Administered 2021-06-29: 10 mg via ORAL
  Filled 2021-06-28: qty 1

## 2021-06-28 MED ORDER — SODIUM CHLORIDE 0.9 % WEIGHT BASED INFUSION
3.0000 mL/kg/h | INTRAVENOUS | Status: DC
  Administered 2021-06-28: 3 mL/kg/h via INTRAVENOUS

## 2021-06-28 MED ORDER — CLOPIDOGREL BISULFATE 300 MG PO TABS
ORAL_TABLET | ORAL | Status: AC
  Filled 2021-06-28: qty 1

## 2021-06-28 MED ORDER — ACETAMINOPHEN 325 MG PO TABS
650.0000 mg | ORAL_TABLET | ORAL | Status: DC | PRN
  Administered 2021-06-28: 650 mg via ORAL
  Filled 2021-06-28: qty 2

## 2021-06-28 MED ORDER — FENTANYL CITRATE (PF) 100 MCG/2ML IJ SOLN
INTRAMUSCULAR | Status: AC
  Filled 2021-06-28: qty 2

## 2021-06-28 MED ORDER — SODIUM CHLORIDE 0.9 % WEIGHT BASED INFUSION: 1.0000 mL/kg/h | INTRAVENOUS | Status: DC

## 2021-06-28 MED ORDER — METOPROLOL SUCCINATE ER 50 MG PO TB24
50.0000 mg | ORAL_TABLET | Freq: Every day | ORAL | Status: DC
  Administered 2021-06-29: 50 mg via ORAL
  Filled 2021-06-28: qty 1

## 2021-06-28 MED ORDER — MIDAZOLAM HCL 2 MG/2ML IJ SOLN
INTRAMUSCULAR | Status: AC
  Filled 2021-06-28: qty 2

## 2021-06-28 MED ORDER — HEPARIN SODIUM (PORCINE) 1000 UNIT/ML IJ SOLN
INTRAMUSCULAR | Status: AC
  Filled 2021-06-28: qty 1

## 2021-06-28 MED ORDER — HEPARIN (PORCINE) IN NACL 1000-0.9 UT/500ML-% IV SOLN
INTRAVENOUS | Status: AC
  Filled 2021-06-28: qty 1000

## 2021-06-28 MED ORDER — HEPARIN (PORCINE) IN NACL 1000-0.9 UT/500ML-% IV SOLN
INTRAVENOUS | Status: DC | PRN
  Administered 2021-06-28 (×2): 500 mL

## 2021-06-28 MED ORDER — ONDANSETRON HCL 4 MG/2ML IJ SOLN: 4.0000 mg | Freq: Four times a day (QID) | INTRAMUSCULAR | Status: DC | PRN

## 2021-06-28 MED ORDER — CLOPIDOGREL BISULFATE 300 MG PO TABS
ORAL_TABLET | ORAL | Status: DC | PRN
  Administered 2021-06-28: 300 mg via ORAL

## 2021-06-28 MED ORDER — CLOPIDOGREL BISULFATE 75 MG PO TABS
75.0000 mg | ORAL_TABLET | Freq: Every morning | ORAL | Status: DC
  Administered 2021-06-29: 75 mg via ORAL
  Filled 2021-06-28: qty 1

## 2021-06-28 SURGICAL SUPPLY — 17 items
BALLN SAPPHIRE 2.5X12 (BALLOONS) ×2
BALLN SAPPHIRE ~~LOC~~ 3.25X12 (BALLOONS) ×1 IMPLANT
BALLOON SAPPHIRE 2.5X12 (BALLOONS) IMPLANT
CATH INFINITI 5FR MULTPACK ANG (CATHETERS) ×1 IMPLANT
CATH VISTA GUIDE 6FR XBLAD3.5 (CATHETERS) ×1 IMPLANT
GUIDEWIRE PRESSURE X 175 (WIRE) ×1 IMPLANT
KIT ENCORE 26 ADVANTAGE (KITS) ×1 IMPLANT
KIT HEART LEFT (KITS) ×2 IMPLANT
KIT MICROPUNCTURE NIT STIFF (SHEATH) ×1 IMPLANT
PACK CARDIAC CATHETERIZATION (CUSTOM PROCEDURE TRAY) ×2 IMPLANT
SHEATH PINNACLE 5F 10CM (SHEATH) ×1 IMPLANT
SHEATH PINNACLE 6F 10CM (SHEATH) ×1 IMPLANT
SHEATH PROBE COVER 6X72 (BAG) ×1 IMPLANT
STENT ONYX FRONTIER 3.0X18 (Permanent Stent) ×1 IMPLANT
TRANSDUCER W/STOPCOCK (MISCELLANEOUS) ×2 IMPLANT
TUBING CIL FLEX 10 FLL-RA (TUBING) ×2 IMPLANT
WIRE EMERALD 3MM-J .035X150CM (WIRE) ×1 IMPLANT

## 2021-06-28 NOTE — Interval H&P Note (Signed)
History and Physical Interval Note:  06/28/2021 8:37 AM  [name] [name]  has presented today for surgery, with the diagnosis of cad - angina.  The various methods of treatment have been discussed with the patient and family. After consideration of risks, benefits and other options for treatment, the patient has consented to  Procedure(s): LEFT HEART CATH AND CORONARY ANGIOGRAPHY (N/A) as a surgical intervention.  The patient's history has been reviewed, patient examined, no change in status, stable for surgery.  I have reviewed the patient's chart and labs.  Questions were answered to the patient's satisfaction.   Cath Lab Visit (complete for each Cath Lab visit)  Clinical Evaluation Leading to the Procedure:   ACS: No.  Non-ACS:    Anginal Classification: CCS III  Anti-ischemic medical therapy: Maximal Therapy (2 or more classes of medications)  Non-Invasive Test Results: No non-invasive testing performed  Prior CABG: No previous CABG        Lauree Chandler

## 2021-06-28 NOTE — Progress Notes (Signed)
Site area- right  Site Prior to Removal- 0   Pressure Applied For-  20 MInutes   Bedrest Beginning at - 1525   Manual- Yes   Patient Status During Pull- Stable    Post Pull Groin Site- 0   Post Pull Instructions Given- Yes   Post Pull Pulses Present- Yes    Dressing Applied- Tegaderm and Gauze Dressing    Comments:  Manual pressure held by Felipe Drone. Instructions given to patient, and she understands.

## 2021-06-29 ENCOUNTER — Encounter (HOSPITAL_COMMUNITY): Payer: Self-pay | Admitting: Cardiovascular Disease

## 2021-06-29 DIAGNOSIS — Z7982 Long term (current) use of aspirin: Secondary | ICD-10-CM | POA: Diagnosis not present

## 2021-06-29 DIAGNOSIS — Z79899 Other long term (current) drug therapy: Secondary | ICD-10-CM | POA: Diagnosis not present

## 2021-06-29 DIAGNOSIS — I25110 Atherosclerotic heart disease of native coronary artery with unstable angina pectoris: Secondary | ICD-10-CM | POA: Diagnosis not present

## 2021-06-29 DIAGNOSIS — E119 Type 2 diabetes mellitus without complications: Secondary | ICD-10-CM | POA: Diagnosis not present

## 2021-06-29 DIAGNOSIS — I10 Essential (primary) hypertension: Secondary | ICD-10-CM | POA: Diagnosis not present

## 2021-06-29 DIAGNOSIS — I200 Unstable angina: Secondary | ICD-10-CM | POA: Diagnosis not present

## 2021-06-29 DIAGNOSIS — Z7984 Long term (current) use of oral hypoglycemic drugs: Secondary | ICD-10-CM | POA: Diagnosis not present

## 2021-06-29 LAB — CBC
HCT: 40.7 % (ref 36.0–46.0)
Hemoglobin: 13.1 g/dL (ref 12.0–15.0)
MCH: 31 pg (ref 26.0–34.0)
MCHC: 32.2 g/dL (ref 30.0–36.0)
MCV: 96.2 fL (ref 80.0–100.0)
Platelets: 216 10*3/uL (ref 150–400)
RBC: 4.23 MIL/uL (ref 3.87–5.11)
RDW: 12.8 % (ref 11.5–15.5)
WBC: 8.4 10*3/uL (ref 4.0–10.5)
nRBC: 0 % (ref 0.0–0.2)

## 2021-06-29 LAB — BASIC METABOLIC PANEL
Anion gap: 9 (ref 5–15)
BUN: 10 mg/dL (ref 8–23)
CO2: 29 mmol/L (ref 22–32)
Calcium: 8.3 mg/dL — ABNORMAL LOW (ref 8.9–10.3)
Chloride: 97 mmol/L — ABNORMAL LOW (ref 98–111)
Creatinine, Ser: 0.74 mg/dL (ref 0.44–1.00)
GFR, Estimated: >60 mL/min (ref >60)
Glucose, Bld: 134 mg/dL — ABNORMAL HIGH (ref 70–99)
Potassium: 3.9 mmol/L (ref 3.5–5.1)
Sodium: 135 mmol/L (ref 135–145)

## 2021-06-29 LAB — POCT ACTIVATED CLOTTING TIME
Activated Clotting Time: 208 seconds
Activated Clotting Time: 179 seconds

## 2021-06-29 NOTE — Discharge Summary (Signed)
Discharge Summary    Patient ID: [name] [name] MRN: [id number]; DOB: 1941-01-30  Admit date: 06/28/2021 Discharge date: 06/29/2021  PCP:  Janith Lima, MD   [facility] Providers Cardiologist:  Sherren Mocha, MD  Cardiology APP:  Liliane Shi, PA-C    Discharge Diagnoses    Principal Problem:   Unstable angina [facility]) Active Problems:   Hyperlipidemia with target LDL less than 100   Essential hypertension  Diagnostic Studies/Procedures    Cath: 06/28/21    Mid LAD lesion is 30% stenosed.   Prox Cx to Mid Cx lesion is 30% stenosed.   Mid RCA lesion is 10% stenosed.   Prox LAD to Mid LAD lesion is 70% stenosed.   A drug-eluting stent was successfully placed using a STENT ONYX FRONTIER 3.0X18.   Post intervention, there is a 0% residual stenosis.   Patent mid RCA stents   Severe proximal LAD stenosis that angiographically appears moderate to severe. RFR assessment with pressure wire of 0.88.  Successful PTCA/DES x 1 proximal LAD (RFR guided) Mild to moderate stenosis in the non-dominant mid Circumflex Large dominant RCA with patent mid stents without restenosis Normal LVEDP   Recommendations: Continue DAPT with ASA/Plavix. Continue beta blocker and statin.   Diagnostic Dominance: Right Intervention     _____________   History of Present Illness     [name] [name] is a 80 y.o. female with PMH of CAD s/p PCI to RCA' 07, DES to pRCA '95, HTN, HLD, DM. She had a recent fall with facial injury.  She went to the ED and CT was neg for acute intracranial abnormality.  She had symptoms of substernal chest pain radiating to her L shoulder x 3 relieved by NTG.  Cath was avoided due to facial hematoma.  She was started on Isosorbide and brought back today for f/u.  She is here alone.  She has had one other episode of angina for which she took NTG.  She also has neck pain which is likely an anginal equivalent.  She has been short of breath with exertion as well.  She  notes these symptoms over the past 2 mos. Of note, she had several mos without symptoms after her last PCI.  She has not had syncope, orthopnea, leg edema. Given her recurrence of symptoms, recommendations for outpatient cardiac cath.   Hospital Course     Underwent cardiac catheterization noted above with severe proximal LAD stenosis, RFR of 0.88.  Successful PCI/DES x1 to proximal LAD RFR guided.  Mild to moderate stenosis in nondominant mid left circumflex, large dominant RCA patent mid vessel stents without restenosis.  Recommendations to continue on DAPT with aspirin/Plavix.  No issues noted overnight.  She was seen by cardiac rehab.  No recurrent chest pain.  Morning labs are stable. She was continued on other home medications without further adjustment.   General: Well developed, well nourished, female appearing in no acute distress. Head: Normocephalic, atraumatic.  Neck: Supple without bruits, JVD. Lungs:  Resp regular and unlabored, CTA. Heart: RRR, S1, S2, no S3, S4, soft systolic murmur; no rub. Abdomen: Soft, non-tender, non-distended with normoactive bowel sounds. No hepatomegaly. No rebound/guarding. No obvious abdominal masses. Extremities: No clubbing, cyanosis edema. Distal pedal pulses are 2+ bilaterally. Right femoral cath site stable without bruising or hematoma Neuro: Alert and oriented X 3. Moves all extremities spontaneously. Psych: Normal affect.   Did the patient have an acute coronary syndrome (MI, NSTEMI, STEMI, etc) this admission?:  No  Did the patient have a percutaneous coronary intervention (stent / angioplasty)?:  Yes.     Cath/PCI Registry Performance & Quality Measures: Aspirin prescribed? - Yes ADP Receptor Inhibitor (Plavix/Clopidogrel, Brilinta/Ticagrelor or Effient/Prasugrel) prescribed (includes medically managed patients)? - Yes High Intensity Statin (Lipitor 40-80mg  or Crestor 20-40mg ) prescribed? - Yes For EF <40%, was  ACEI/ARB prescribed? - Not Applicable (EF >/= 02%) For EF <40%, Aldosterone Antagonist (Spironolactone or Eplerenone) prescribed? - Not Applicable (EF >/= 40%) Cardiac Rehab Phase II ordered? - Yes       The patient will be scheduled for a TOC follow up appointment in 10-14 days.  A message has been sent to the [facility] and Scheduling Pool at the office where the patient should be seen for follow up.  _____________  Discharge Vitals Blood pressure (!) 141/64, pulse 76, temperature 98.2 F (36.8 C), temperature source Oral, resp. rate 19, height 5' (1.524 m), weight 56.7 kg, SpO2 96 %.  Filed Weights   06/28/21 0800  Weight: 56.7 kg    Labs & Radiologic Studies    CBC Recent Labs    06/29/21 0102  WBC 8.4  HGB 13.1  HCT 40.7  MCV 96.2  PLT 973   Basic Metabolic Panel Recent Labs    06/29/21 0102  NA 135  K 3.9  CL 97*  CO2 29  GLUCOSE 134*  BUN 10  CREATININE 0.74  CALCIUM 8.3*   Liver Function Tests No results for input(s): AST, ALT, ALKPHOS, BILITOT, PROT, ALBUMIN in the last 72 hours. No results for input(s): LIPASE, AMYLASE in the last 72 hours. High Sensitivity Troponin:   No results for input(s): TROPONINIHS in the last 720 hours.  BNP Invalid input(s): POCBNP D-Dimer No results for input(s): DDIMER in the last 72 hours. Hemoglobin A1C No results for input(s): HGBA1C in the last 72 hours. Fasting Lipid Panel No results for input(s): CHOL, HDL, LDLCALC, TRIG, CHOLHDL, LDLDIRECT in the last 72 hours. Thyroid Function Tests No results for input(s): TSH, T4TOTAL, T3FREE, THYROIDAB in the last 72 hours.  Invalid input(s): FREET3 _____________  CARDIAC CATHETERIZATION  Result Date: 06/28/2021   Mid LAD lesion is 30% stenosed.   Prox Cx to Mid Cx lesion is 30% stenosed.   Mid RCA lesion is 10% stenosed.   Prox LAD to Mid LAD lesion is 70% stenosed.   A drug-eluting stent was successfully placed using a STENT ONYX FRONTIER 3.0X18.   Post intervention, there  is a 0% residual stenosis.   Patent mid RCA stents Severe proximal LAD stenosis that angiographically appears moderate to severe. RFR assessment with pressure wire of 0.88. Successful PTCA/DES x 1 proximal LAD (RFR guided) Mild to moderate stenosis in the non-dominant mid Circumflex Large dominant RCA with patent mid stents without restenosis Normal LVEDP Recommendations: Continue DAPT with ASA/Plavix. Continue beta blocker and statin.   Disposition   Pt is being discharged home today in good condition.  Follow-up Plans & Appointments     Follow-up Information     Liliane Shi, PA-C Follow up on 07/24/2021.   Specialties: Cardiology, Physician Assistant Why: at 10:55am for your follow up appt Contact information: [address] [place] [address] [phone]                Discharge Instructions     Call MD for:  difficulty breathing, headache or visual disturbances   Complete by: As directed    Call MD for:  persistant dizziness or light-headedness  Complete by: As directed    Call MD for:  redness, tenderness, or signs of infection (pain, swelling, redness, odor or green/yellow discharge around incision site)   Complete by: As directed    Diet - low sodium heart healthy   Complete by: As directed    Discharge instructions   Complete by: As directed    Groin Site Care Refer to this sheet in the next few weeks. These instructions provide you with information on caring for yourself after your procedure. Your caregiver may also give you more specific instructions. Your treatment has been planned according to current medical practices, but problems sometimes occur. Call your caregiver if you have any problems or questions after your procedure. HOME CARE INSTRUCTIONS You may shower 24 hours after the procedure. Remove the bandage (dressing) and gently wash the site with plain soap and water. Gently pat the site dry.  Do not apply powder or lotion to the  site.  Do not sit in a bathtub, swimming pool, or whirlpool for 5 to 7 days.  No bending, squatting, or lifting anything over 10 pounds (4.5 kg) as directed by your caregiver.  Inspect the site at least twice daily.  Do not drive home if you are discharged the same day of the procedure. Have someone else drive you.  You may drive 24 hours after the procedure unless otherwise instructed by your caregiver.  What to expect: Any bruising will usually fade within 1 to 2 weeks.  Blood that collects in the tissue (hematoma) may be painful to the touch. It should usually decrease in size and tenderness within 1 to 2 weeks.  SEEK IMMEDIATE MEDICAL CARE IF: You have unusual pain at the groin site or down the affected leg.  You have redness, warmth, swelling, or pain at the groin site.  You have drainage (other than a small amount of blood on the dressing).  You have chills.  You have a fever or persistent symptoms for more than 72 hours.  You have a fever and your symptoms suddenly get worse.  Your leg becomes pale, cool, tingly, or numb.  You have heavy bleeding from the site. Hold pressure on the site. Marland Kitchen  PLEASE DO NOT MISS ANY DOSES OF YOUR PLAVIX!!!!! Also keep a log of you blood pressures and bring back to your follow up appt. Please call the office with any questions.   Patients taking blood thinners should generally stay away from medicines like ibuprofen, Advil, Motrin, naproxen, and Aleve due to risk of stomach bleeding. You may take Tylenol as directed or talk to your primary doctor about alternatives.  PLEASE ENSURE THAT YOU DO NOT RUN OUT OF YOUR PLAVIX. This medication is very important to remain on for at least one year. IF you have issues obtaining this medication due to cost please CALL the office 3-5 business days prior to running out in order to prevent missing doses of this medication.   Increase activity slowly   Complete by: As directed        Discharge Medications    Allergies as of 06/29/2021       Reactions   Nitrofurantoin Nausea Only   Severe nausea   Niacin And Related Other (See Comments)   Must take "Flush-free"   Ceftriaxone Itching   Codeine Nausea Only   Prednisone Itching, Rash   Sulfonamide Derivatives Itching   Tetracycline Itching, Rash        Medication List     TAKE these medications  amLODipine 5 MG tablet Commonly known as: NORVASC Take 1 tablet (5 mg total) by mouth daily.   aspirin EC 81 MG tablet Take 81 mg by mouth every morning.   atorvastatin 80 MG tablet Commonly known as: LIPITOR TAKE ONE TABLET BY MOUTH EVERYDAY AT BEDTIME   celecoxib 50 MG capsule Commonly known as: CELEBREX TAKE ONE CAPSULE BY MOUTH twice daily   Cinnamon 500 MG Tabs Take 1,000 mg by mouth daily.   clopidogrel 75 MG tablet Commonly known as: PLAVIX TAKE ONE TABLET BY MOUTH EVERY MORNING   DAILY FIBER PO Take 1 capsule by mouth in the morning and at bedtime.   Dexilant 60 MG capsule Generic drug: dexlansoprazole TAKE ONE CAPSULE BY MOUTH EVERY MORNING   dicyclomine 10 MG capsule Commonly known as: BENTYL TAKE ONE CAPSULE BY MOUTH FOUR TIMES DAILY   ezetimibe 10 MG tablet Commonly known as: ZETIA TAKE ONE TABLET BY MOUTH EVERY MORNING   FOCUSED MIND PO Take 15 mg by mouth daily.   gabapentin 300 MG capsule Commonly known as: NEURONTIN TAKE TWO CAPSULES BY MOUTH EVERY MORNING and TAKE TWO CAPSULES BY MOUTH EVERYDAY AT BEDTIME   isosorbide mononitrate 30 MG 24 hr tablet Commonly known as: IMDUR Take 1 tablet (30 mg total) by mouth daily.   Krill Oil 1000 MG Caps Take 1,000 mg by mouth daily.   magnesium oxide 400 MG tablet Commonly known as: MAG-OX Take 400 mg by mouth daily.   metoprolol succinate 50 MG 24 hr tablet Commonly known as: TOPROL-XL Take 1 tablet (50 mg total) by mouth daily.   nitroGLYCERIN 0.4 MG SL tablet Commonly known as: NITROSTAT Place 1 tablet (0.4 mg total) under the tongue every  5 (five) minutes as needed for chest pain.   omega-3 acid ethyl esters 1 g capsule Commonly known as: LOVAZA Take 1 g by mouth 2 (two) times daily.   saccharomyces boulardii 250 MG capsule Commonly known as: FLORASTOR Take 250 mg by mouth 2 (two) times daily.   TURMERIC PO Take 1,000 mg by mouth in the morning and at bedtime.   venlafaxine XR 37.5 MG 24 hr capsule Commonly known as: EFFEXOR-XR TAKE ONE CAPSULE BY MOUTH EVERY MORNING   Vitamin C 500 MG Caps Take 500 mg by mouth in the morning and at bedtime.   Vitamin D (Ergocalciferol) 1.25 MG (50000 UNIT) Caps capsule Commonly known as: DRISDOL Take 1 capsule (50,000 Units total) by mouth every 7 (seven) days. What changed: when to take this   vitamin E 1000 UNIT capsule Take 1,000 Units by mouth in the morning and at bedtime.        Outstanding Labs/Studies   N/a   Duration of Discharge Encounter   Greater than 30 minutes including physician time.  Signed, Reino Bellis, NP 06/29/2021, 8:15 AM  Patient seen, examined. Available data reviewed. Agree with findings, assessment, and plan as outlined by Reino Bellis, NP.  The patient is independently interviewed and examined.  She is alert, oriented, in no distress.  JVP is normal, HEENT is normal, lungs are clear, heart is regular rate and rhythm no murmur gallop, abdomen soft nontender, extremities have no edema, right groin site is clear without hematoma or ecchymosis.  Cardiac catheterization study reviewed and patient underwent uncomplicated PCI of the LAD yesterday under pressure wire guidance.  Her medicine program is reviewed and is appropriate.  Follow-up is arranged as above.  Post PCI instructions reviewed with the patient.  She has had multiple  past PCI procedures.  Medically stable for discharge today.  Sherren Mocha, M.D. 06/29/2021 9:25 AM

## 2021-06-29 NOTE — Progress Notes (Addendum)
CARDIAC REHAB PHASE I   PRE:  Rate/Rhythm: 75 SR    BP: sitting left 210/71, right 186/74    SaO2: 97 RA  MODE:  Ambulation: 400 ft   POST:  Rate/Rhythm: 101 ST    BP: sitting 199/76 right     SaO2:   Pt feeling well. BP elevated and asked RN to bring meds. No c/o. Reviewed Plavix, restrictions, exercise, NTG and CRPII. She has not been able to do CRPII in the past due to copays for program. Will place order for [place] but pt will keep walking for exercise. [place], ACSM 06/29/2021 9:18 AM

## 2021-07-23 NOTE — Progress Notes (Addendum)
Cardiology Office Note:    Date:  07/24/2021   ID:  YAILYN STRACK, DOB 1941/06/26, MRN [id number]  PCP:  Janith Lima, MD   [facility] Providers Cardiologist:  Sherren Mocha, MD Cardiology APP:  Sharmon [place]vere    Referring MD: Janith Lima, MD   Chief Complaint:  Hospitalization Follow-up (S/p cath, PCI)    Patient Profile:   [name] [name] is a 80 y.o. 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 Santucci was last seen in 10/22.  She was having recurrent anginal symptoms and I set her up for a cardiac catheterization.   This demonstrated severe proximal LAD stenosis that was hemodynamically significant based upon RFR (0.88).  She underwent PCI with a DES to the pLAD.  There were patent stents in the RCA and mild to mod stenosis in a non-dominant LCx.   DAPT was recommended for a minimum of 6 mos.  Her post PCI course was uneventful and she was DC the next day.  She returns for f/u.  She is here with her daughter.  Since discharge, she feels much better.  She did have 1 episode of angina.  She took 1 dose of nitroglycerin with relief.  She brings in a list for blood pressures.  She has several readings above target and several that are at goal.  She has had a headache for several weeks.  Question if this is related to isosorbide.  She has not had any further chest discomfort.  Her breathing has improved significantly.  She has not had orthopnea, leg edema or  syncope.  ASSESSMENT & PLAN:   CAD (coronary artery disease) Hx of inferior MI in 2007 tx with DES to the RCA.  She underwent POBA to the mid RCA stent and DES to the distal RCA (overlapping with the prior stent) in 07/2017 in [place] and DES to the RCA in 7/21.  Recent cardiac catheterization was arranged to worsening angina and demonstrated significant stenosis in the LAD which was treated with a DES.  Since discharge from hospital, she has been doing well.  She had 1 episode of angina but no symptoms since.  We reviewed the findings from her cardiac catheterization.  She has been walking at home without exertional chest pain.  Electrocardiogram today demonstrates no acute changes.  She needs to remain on dual antiplatelet therapy for minimum of 6 months.  Continue aspirin 81 mg daily, clopidogrel 75 mg daily, atorvastatin 80 mg daily, metoprolol succinate 50 mg daily.  She has been having headaches for the last couple of months.  This may be related to isosorbide.  I will stop her isosorbide and increase her amlodipine to 10 mg daily.  She cannot afford cardiac rehab and prefers to continue walking on her own at home.  Follow-up with Dr. Burt Knack in 6 months.  Essential hypertension She has had fluctuations in blood pressures at home.  Repeat blood pressure by me today was 146/80.  As noted, I will stop her isosorbide.  I will increase her amlodipine to 10 mg daily.  Change metoprolol succinate to take in the evening to see if she can get better coverage.  I have asked her to see me blood pressure readings in a couple of weeks.  Mixed hyperlipidemia LDL optimal.  Continue atorvastatin 80 mg daily.          Dispo:  Return in about 6 months (around 01/21/2022) for Routine Follow Up, w/ Dr. Burt Knack.    Prior CV studies: Cardiac catheterization  LAD prox to mid 46 (RFR 0.88), mid 30 LCx prox to mid 30 RCA mid stent patent with 10 ISR PCI: 3 x 18 mm Onyx Frontier DES to pLAD   Cardiac  catheterization 03/09/2020 RCA proximal 95, mid stents patent with 10 ISR LCx proximal 30 LAD proximal 50, mid 30 PCI: 2.75 x 18 mm Resolute Onyx DES to the proximal RCA   Myoview 11/12/2019 EF > 65, normal perfusion (no ischemia or infarction)   Echocardiogram 05/01/2019 EF 60-65, Gr 2 DD, normal RVSF, mild LAE, mod MAC, trivial MR, mod AoV sclerosis with mobile structure representing Lambl's excrescence    [For studies older than 04/2019 - see my note from 03/22/2020]   Past Medical History:  Diagnosis Date   Allergy    Anal fissure    Anxiety    CAD (coronary artery disease)    s/p inf MI 2007 - tx w/ DES to RCA // s/p POBA to [name] and DES to dRCA in 11/18 ([facility] in [place], [place]) // Myoview 3/21: low risk // s/p DES to pRCA   Cataract    removed both eyes   Cerebrovascular disease    Carotid US 09/2017 [facility]): mild plaque (<50%) in both carotid arteries   Chronic neck pain    Chronic pain syndrome    Diabetes Mellitus, Type 2    Diverticular disease    DJD (degenerative joint disease)    Echocardiogram 10/2018    Echo 10/2018: EF 55-60, normal RVSF, mod MAC, mod TR, severe AoV calcification and sclerosis with nodular calcium/mobile area of calcium in the LVOT (small veg vs Lambl's excrescence  - consider TEE), mild AI, mild AS (mean 11).    Echocardiogram 04/2019    Echocardiogram 04/2019: EF 60-65, basal septal hypertrophy, grade 2 diastolic dysfunction, normal wall motion, normal RV SF, mild LAE, mod MAC, trivial MR, mod sclerosis of the aortic valve with mod aortic annular calcification, thin mobile filamentous structure on ventricular side of AV likely representing Lambl's excrescence, mild AI, mild TR   Frequent UTI's    Gastroesophageal reflux disease    H/O bacterial endocarditis    Rx w IV Abxs in 2020 ([facility] in [place], [place]) // F/u echo with AoV Lambl's excrescence   H/O hiatal hernia    HTN (hypertension)    Hx of fall 10/2020   Hx of MI 2007   Hx of Stroke     IBS (irritable bowel syndrome)    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 (Cervical Fusion)   Premature ventricular contractions    Tubular adenoma of colon  Ulcer    [facility]   Urinary incontinence    Current Medications: Current Meds  Medication Sig   Ascorbic Acid (VITAMIN C) 500 MG CAPS Take 500 mg by mouth in the morning and at bedtime.   aspirin EC 81 MG tablet Take 81 mg by mouth every morning.    atorvastatin (LIPITOR) 80 MG tablet TAKE ONE TABLET BY MOUTH EVERYDAY AT BEDTIME   celecoxib (CELEBREX) 50 MG capsule TAKE ONE CAPSULE BY MOUTH twice daily   Cinnamon 500 MG TABS Take 1,000 mg by mouth daily.   clopidogrel (PLAVIX) 75 MG tablet TAKE ONE TABLET BY MOUTH EVERY MORNING   DEXILANT 60 MG capsule TAKE ONE CAPSULE BY MOUTH EVERY MORNING   dicyclomine (BENTYL) 10 MG capsule TAKE ONE CAPSULE BY MOUTH FOUR TIMES DAILY   ezetimibe (ZETIA) 10 MG tablet TAKE ONE TABLET BY MOUTH EVERY MORNING   gabapentin (NEURONTIN) 300 MG capsule TAKE TWO CAPSULES BY MOUTH EVERY MORNING and TAKE TWO CAPSULES BY MOUTH EVERYDAY AT BEDTIME   Krill Oil 1000 MG CAPS Take 1,000 mg by mouth daily.   magnesium oxide (MAG-OX) 400 MG tablet Take 400 mg by mouth daily.   Misc Natural Products (FOCUSED MIND PO) Take 15 mg by mouth daily.   nitroGLYCERIN (NITROSTAT) 0.4 MG SL tablet Place 1 tablet (0.4 mg total) under the tongue every 5 (five) minutes as needed for chest pain.   omega-3 acid ethyl esters (LOVAZA) 1 g capsule Take 1 g by mouth 2 (two) times daily.   Psyllium (DAILY FIBER PO) Take 1 capsule by mouth in the morning and at bedtime.   saccharomyces boulardii (FLORASTOR) 250 MG capsule Take 250 mg  by mouth 2 (two) times daily.   TURMERIC PO Take 1,000 mg by mouth in the morning and at bedtime.   venlafaxine XR (EFFEXOR-XR) 37.5 MG 24 hr capsule TAKE ONE CAPSULE BY MOUTH EVERY MORNING   Vitamin D, Ergocalciferol, (DRISDOL) 1.25 MG (50000 UNIT) CAPS capsule Take 1 capsule (50,000 Units total) by mouth every 7 (seven) days.   vitamin E 1000 UNIT capsule Take 1,000 Units by mouth in the morning and at bedtime.   [DISCONTINUED] amLODipine (NORVASC) 5 MG tablet Take 1 tablet (5 mg total) by mouth daily.   [DISCONTINUED] isosorbide mononitrate (IMDUR) 30 MG 24 hr tablet Take 1 tablet (30 mg total) by mouth daily.   [DISCONTINUED] metoprolol succinate (TOPROL-XL) 50 MG 24 hr tablet Take 1 tablet (50 mg total) by mouth daily.    Allergies:   Nitrofurantoin, Niacin and related, Ceftriaxone, Codeine, Prednisone, Sulfonamide derivatives, and Tetracycline   Social History   Tobacco Use   Smoking status: Never   Smokeless tobacco: Never  Vaping Use   Vaping Use: Never used  Substance Use Topics   Alcohol use: No   Drug use: No    Family Hx: The patient's family history includes Cancer - Other in her brother; Colon polyps in her sister; Coronary artery disease in some other family members; Heart disease in her mother; Lung cancer in her brother; Pancreatic cancer in her mother. There is no history of Colon cancer, Esophageal cancer, Rectal cancer, or Stomach cancer.  ROS   EKGs/Labs/Other Test Reviewed:    EKG:  EKG is  ordered today.  The ekg ordered today demonstrates NSR, HR 77, left axis deviation, nonspecific ST-T wave changes, QTC 420  Recent Labs: 09/12/2020: TSH 1.54 05/02/2021: ALT 24 06/29/2021: BUN 10; Creatinine, Ser 0.74; Hemoglobin 13.1; Platelets 216; Potassium 3.9;  Sodium 135   Recent Lipid Panel Lab Results  Component Value Date/Time   CHOL 112 09/12/2020 03:16 PM   TRIG 244.0 (H) 09/12/2020 03:16 PM   TRIG 164 (H) 06/24/2006 07:38 AM   HDL 48.80 09/12/2020 03:16 PM    LDLCALC 26 04/30/2019 11:34 AM   LDLDIRECT 42.0 09/12/2020 03:16 PM     Risk Assessment/Calculations:          Physical Exam:    VS:  BP (!) 160/70   Pulse 77   Ht _0  (1.499 m)   Wt 127 lb 12.8 oz (58 kg)   SpO2 94%   BMI 25.81 kg/m     Wt Readings from Last 3 Encounters:  07/24/21 127 lb 12.8 oz (58 kg)  06/28/21 125 lb (56.7 kg)  06/23/21 125 lb 9.6 oz (57 kg)    Constitutional:      Appearance: Healthy appearance. Not in distress.  Neck:     Vascular: No JVR. JVD normal.  Pulmonary:     Effort: Pulmonary effort is normal.     Breath sounds: No wheezing. No rales.  Cardiovascular:     Normal rate. Regular rhythm. Normal S1. Normal S2.      Murmurs: There is no murmur.     Comments: R FA site without hematoma or bruit Edema:    Peripheral edema absent.  Abdominal:     Palpations: Abdomen is soft. There is no hepatomegaly.  Skin:    General: Skin is warm and dry.  Neurological:     Mental Status: Alert and oriented to person, place and time.     Cranial Nerves: Cranial nerves are intact.       Medication Adjustments/Labs and Tests Ordered: Current medicines are reviewed at length with the patient today.  Concerns regarding medicines are outlined above.  Tests Ordered: Orders Placed This Encounter  Procedures   EKG 12-Lead    Medication Changes: Meds ordered this encounter  Medications   amLODipine (NORVASC) 10 MG tablet    Sig: Take 1 tablet (10 mg total) by mouth daily after breakfast.    Dispense:  30 tablet    Refill:  11   metoprolol succinate (TOPROL-XL) 50 MG 24 hr tablet    Sig: Take 1 tablet (50 mg total) by mouth at bedtime.    Dispense:  90 tablet    Refill:  3    Signed, Richardson Dopp, PA-C  07/24/2021 5:03 PM    [facility] [address], [place], [place]  [address] Phone: [phone]; Fax: [phone]

## 2021-07-24 ENCOUNTER — Other Ambulatory Visit: Payer: Self-pay

## 2021-07-24 ENCOUNTER — Ambulatory Visit: Payer: PPO | Admitting: Physician Assistant

## 2021-07-24 ENCOUNTER — Encounter: Payer: Self-pay | Admitting: Physician Assistant

## 2021-07-24 VITALS — BP 160/70 | HR 77 | Ht 59.0 in | Wt 127.8 lb

## 2021-07-24 DIAGNOSIS — I25119 Atherosclerotic heart disease of native coronary artery with unspecified angina pectoris: Secondary | ICD-10-CM | POA: Diagnosis not present

## 2021-07-24 DIAGNOSIS — I2510 Atherosclerotic heart disease of native coronary artery without angina pectoris: Secondary | ICD-10-CM

## 2021-07-24 DIAGNOSIS — E782 Mixed hyperlipidemia: Secondary | ICD-10-CM | POA: Diagnosis not present

## 2021-07-24 DIAGNOSIS — I10 Essential (primary) hypertension: Secondary | ICD-10-CM

## 2021-07-24 MED ORDER — AMLODIPINE BESYLATE 10 MG PO TABS: 10.0000 mg | ORAL_TABLET | Freq: Every day | ORAL | 11 refills | Status: DC

## 2021-07-24 MED ORDER — METOPROLOL SUCCINATE ER 50 MG PO TB24: 50.0000 mg | ORAL_TABLET | Freq: Every day | ORAL | 3 refills | Status: DC

## 2021-07-24 NOTE — Assessment & Plan Note (Signed)
She has had fluctuations in blood pressures at home.  Repeat blood pressure by me today was 146/80.  As noted, I will stop her isosorbide.  I will increase her amlodipine to 10 mg daily.  Change metoprolol succinate to take in the evening to see if she can get better coverage.  I have asked her to see me blood pressure readings in a couple of weeks.

## 2021-07-24 NOTE — Patient Instructions (Signed)
Medication Instructions:   DISCONTINUE Imdur  INCREASE Amlodipine one (1) tablet by mouth ( 10 mg) daily, you can use up your ( 5mg ) tablets and take two at the same time, take this medication in the am.   CHANGE Toprol to take in the pm.  *If you need a refill on your cardiac medications before your next appointment, please call your pharmacy*   Lab Work:  -NONE  If you have labs (blood work) drawn today and your tests are completely normal, you will receive your results only by: [place] (if you have MyChart) OR A paper copy in the mail If you have any lab test that is abnormal or we need to change your treatment, we will call you to review the results.   Testing/Procedures:  -NONE-   Follow-Up: At [facility], you and your health needs are our priority.  As part of our continuing mission to provide you with exceptional heart care, we have created designated Provider Care Teams.  These Care Teams include your primary Cardiologist (physician) and Advanced Practice Providers (APPs -  Physician Assistants and Nurse Practitioners) who all work together to provide you with the care you need, when you need it.  We recommend signing up for the patient portal called "MyChart".  Sign up information is provided on this After Visit Summary.  MyChart is used to connect with patients for Virtual Visits (Telemedicine).  Patients are able to view lab/test results, encounter notes, upcoming appointments, etc.  Non-urgent messages can be sent to your provider as well.   To learn more about what you can do with MyChart, go to [email or web address].    Your next appointment:   6 month(s)  The format for your next appointment:   In Person  Provider:   Sherren Mocha, MD     Other Instructions  Your physician wants you to follow-up in: 6 months with Dr.Cooper.  You will receive a reminder letter in the mail two months in advance. If you don't receive a letter, please call our  office to schedule the follow-up appointment.  Please take your blood pressure X 1 week and send the readings through mychart.

## 2021-07-24 NOTE — Assessment & Plan Note (Signed)
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  She underwent POBA to the mid RCA stent and DES to the distal RCA (overlapping with the prior stent) in 07/2017 in [place] and DES to the RCA in 7/21.  Recent cardiac catheterization was arranged to worsening angina and demonstrated significant stenosis in the LAD which was treated with a DES.  Since discharge from hospital, she has been doing well.  She had 1 episode of angina but no symptoms since.  We reviewed the findings from her cardiac catheterization.  She has been walking at home without exertional chest pain.  Electrocardiogram today demonstrates no acute changes.  She needs to remain on dual antiplatelet therapy for minimum of 6 months.  Continue aspirin 81 mg daily, clopidogrel 75 mg daily, atorvastatin 80 mg daily, metoprolol succinate 50 mg daily.  She has been having headaches for the last couple of months.  This may be related to isosorbide.  I will stop her isosorbide and increase her amlodipine to 10 mg daily.  She cannot afford cardiac rehab and prefers to continue walking on her own at home.  Follow-up with Dr. Burt Knack in 6 months.

## 2021-07-24 NOTE — Assessment & Plan Note (Signed)
LDL optimal.  Continue atorvastatin 80 mg daily.

## 2021-07-25 ENCOUNTER — Encounter: Payer: Self-pay | Admitting: Internal Medicine

## 2021-07-25 ENCOUNTER — Ambulatory Visit (INDEPENDENT_AMBULATORY_CARE_PROVIDER_SITE_OTHER): Payer: PPO | Admitting: Internal Medicine

## 2021-07-25 VITALS — BP 150/70 | HR 83 | Ht 59.0 in | Wt 127.0 lb

## 2021-07-25 DIAGNOSIS — K648 Other hemorrhoids: Secondary | ICD-10-CM | POA: Diagnosis not present

## 2021-07-25 DIAGNOSIS — R143 Flatulence: Secondary | ICD-10-CM | POA: Diagnosis not present

## 2021-07-25 NOTE — Patient Instructions (Signed)
Stop Florastor for 1 month and see if that helps with excessive gas.    HEMORRHOID BANDING PROCEDURE    FOLLOW-UP CARE   The procedure you have had should have been relatively painless since the banding of the area involved does not have nerve endings and there is no pain sensation.  The rubber band cuts off the blood supply to the hemorrhoid and the band may fall off as soon as 48 hours after the banding (the band may occasionally be seen in the toilet bowl following a bowel movement). You may notice a temporary feeling of fullness in the rectum which should respond adequately to plain Tylenol or Motrin.  Following the banding, avoid strenuous exercise that evening and resume full activity the next day.  A sitz bath (soaking in a warm tub) or bidet is soothing, and can be useful for cleansing the area after bowel movements.     To avoid constipation, take two tablespoons of natural wheat bran, natural oat bran, flax, Benefiber or any over the counter fiber supplement and increase your water intake to 7-8 glasses daily.    Unless you have been prescribed anorectal medication, do not put anything inside your rectum for two weeks: No suppositories, enemas, fingers, etc.  Occasionally, you may have more bleeding than usual after the banding procedure.  This is often from the untreated hemorrhoids rather than the treated one.  Don't be concerned if there is a tablespoon or so of blood.  If there is more blood than this, lie flat with your bottom higher than your head and apply an ice pack to the area. If the bleeding does not stop within a half an hour or if you feel faint, call our office at [phone] or go to the emergency room.  Problems are not common; however, if there is a substantial amount of bleeding, severe pain, chills, fever or difficulty passing urine (very rare) or other problems, you should call us at [phone] or report to the nearest emergency room.  Do not stay seated  continuously for more than 2-3 hours for a day or two after the procedure.  Tighten your buttock muscles 10-15 times every two hours and take 10-15 deep breaths every 1-2 hours.  Do not spend more than a few minutes on the toilet if you cannot empty your bowel; instead re-visit the toilet at a later time.    Thank you for choosing me and [facility].  Dr. Ulice Dash Pyrtle

## 2021-07-25 NOTE — Progress Notes (Signed)
[name] [name] is an 80 year old female with a history of symptomatic internal hemorrhoids with bleeding and prolapse who returns for additional banding  Hemorrhoidal banding performed to the left lateral hemorrhoid on 12/01/2020 in the right anterior hemorrhoid on 05/16/2021  Overall symptoms of bleeding and prolapse have improved though she still sees scant red blood on occasion with bowel movement.   PROCEDURE NOTE:  The patient presents with symptomatic grade 3 internal hemorrhoids, requesting rubber band ligation of her hemorrhoidal disease.  All risks, benefits and alternative forms of therapy were described and informed consent was obtained.   The anorectum was pre-medicated with 0.125% nitroglycerin ointment The decision was made to band the RP (RA and LL banded previously) internal hemorrhoid, and the [place] was used to perform band ligation without complication.   Digital anorectal examination was then performed to assure proper positioning of the band, and to adjust the banded tissue as required.  The patient was discharged home without pain or other issues.  Dietary and behavioral recommendations were given and along with follow-up instructions.     The patient will return as needed for follow-up and possible additional banding as required. No complications were encountered and the patient tolerated the procedure well.  2.  Gas/flatulence --an issue for her just over the last month or so.  No new medications.  She has been on Florastor for some time.  No abdominal pain. --Stop Florastor for about a month to see if gas and bloating improve, if not she can resume it if it is helpful for her --If stopping probiotic does not help gas and bloating try Phazyme over-the-counter per box instruction --Contact me if still a problem thereafter

## 2021-08-04 ENCOUNTER — Encounter: Payer: Self-pay | Admitting: Cardiovascular Disease

## 2021-08-07 ENCOUNTER — Other Ambulatory Visit: Payer: Self-pay | Admitting: *Deleted

## 2021-08-07 DIAGNOSIS — I10 Essential (primary) hypertension: Secondary | ICD-10-CM

## 2021-08-07 DIAGNOSIS — I25119 Atherosclerotic heart disease of native coronary artery with unspecified angina pectoris: Secondary | ICD-10-CM

## 2021-08-07 MED ORDER — HYDROCHLOROTHIAZIDE 12.5 MG PO CAPS: 12.5000 mg | ORAL_CAPSULE | Freq: Every day | ORAL | 3 refills | Status: DC

## 2021-08-07 NOTE — Telephone Encounter (Signed)
BP above goal. PLAN:  -Start HCTZ 12.5 mg once daily -BMET in 1-2 weeks Richardson Dopp, PA-C    08/07/2021 8:51 AM

## 2021-08-07 NOTE — Telephone Encounter (Signed)
S/w pt is aware of recommendations.  Pt will start HCTZ one (1) tablet by mouth ( 12.5 mg) daily, sent in to requested pharmacy # [id number].  Pt will come in on Dec 21 for repeat bmet, appt made, orders in and linked. Pt will continue to monitor bp and send in readings X 2 weeks.

## 2021-08-14 ENCOUNTER — Other Ambulatory Visit: Payer: Self-pay | Admitting: Internal Medicine

## 2021-08-14 DIAGNOSIS — M542 Cervicalgia: Secondary | ICD-10-CM

## 2021-08-23 ENCOUNTER — Other Ambulatory Visit: Payer: PPO

## 2021-08-23 ENCOUNTER — Other Ambulatory Visit: Payer: Self-pay

## 2021-08-23 DIAGNOSIS — I25119 Atherosclerotic heart disease of native coronary artery with unspecified angina pectoris: Secondary | ICD-10-CM

## 2021-08-23 DIAGNOSIS — I10 Essential (primary) hypertension: Secondary | ICD-10-CM

## 2021-08-23 LAB — BASIC METABOLIC PANEL
BUN/Creatinine Ratio: 15 (ref 12–28)
BUN: 14 mg/dL (ref 8–27)
CO2: 30 mmol/L — ABNORMAL HIGH (ref 20–29)
Calcium: 9.3 mg/dL (ref 8.7–10.3)
Chloride: 86 mmol/L — ABNORMAL LOW (ref 96–106)
Creatinine, Ser: 0.93 mg/dL (ref 0.57–1.00)
Glucose: 161 mg/dL — ABNORMAL HIGH (ref 70–99)
Potassium: 3.5 mmol/L (ref 3.5–5.2)
Sodium: 129 mmol/L — ABNORMAL LOW (ref 134–144)
eGFR: 62 mL/min/{1.73_m2} (ref >59)

## 2021-08-24 ENCOUNTER — Telehealth: Payer: Self-pay

## 2021-08-24 ENCOUNTER — Other Ambulatory Visit: Payer: Self-pay | Admitting: Internal Medicine

## 2021-08-24 DIAGNOSIS — K2100 Gastro-esophageal reflux disease with esophagitis, without bleeding: Secondary | ICD-10-CM

## 2021-08-24 DIAGNOSIS — M159 Polyosteoarthritis, unspecified: Secondary | ICD-10-CM

## 2021-08-24 DIAGNOSIS — E785 Hyperlipidemia, unspecified: Secondary | ICD-10-CM

## 2021-08-24 DIAGNOSIS — F418 Other specified anxiety disorders: Secondary | ICD-10-CM

## 2021-08-24 DIAGNOSIS — I2510 Atherosclerotic heart disease of native coronary artery without angina pectoris: Secondary | ICD-10-CM

## 2021-08-24 DIAGNOSIS — I10 Essential (primary) hypertension: Secondary | ICD-10-CM

## 2021-08-24 NOTE — Telephone Encounter (Signed)
The patient has been notified of the result and verbalized understanding.  All questions (if any) were answered. Antonieta Iba, RN 08/24/2021 9:07 AM  Patient will discontinue HCTZ. Repeat lab work has been scheduled.

## 2021-08-24 NOTE — Telephone Encounter (Signed)
-----   Message from Liliane Shi, PA-C sent at 08/24/2021  8:55 AM EST ----- Na low.  Cl low, CO2 elevated.  K+ low normal.  Creatinine normal. HCTZ is contributing to lab abnormalities. PLAN:  - DC HCTZ.   - Repeat BMET in 1 week. - Will decide on alternate BP med with lab results in 1 week. Richardson Dopp, PA-C    08/24/2021 8:47 AM

## 2021-08-31 ENCOUNTER — Other Ambulatory Visit: Payer: Self-pay

## 2021-08-31 ENCOUNTER — Other Ambulatory Visit: Payer: PPO | Admitting: *Deleted

## 2021-08-31 DIAGNOSIS — I10 Essential (primary) hypertension: Secondary | ICD-10-CM | POA: Diagnosis not present

## 2021-09-01 ENCOUNTER — Telehealth: Payer: Self-pay

## 2021-09-01 ENCOUNTER — Other Ambulatory Visit: Payer: Self-pay | Admitting: Physician Assistant

## 2021-09-01 DIAGNOSIS — I10 Essential (primary) hypertension: Secondary | ICD-10-CM

## 2021-09-01 LAB — BASIC METABOLIC PANEL
BUN/Creatinine Ratio: 15 (ref 12–28)
BUN: 14 mg/dL (ref 8–27)
CO2: 28 mmol/L (ref 20–29)
Calcium: 8.8 mg/dL (ref 8.7–10.3)
Chloride: 97 mmol/L (ref 96–106)
Creatinine, Ser: 0.95 mg/dL (ref 0.57–1.00)
Glucose: 128 mg/dL — ABNORMAL HIGH (ref 70–99)
Potassium: 3.8 mmol/L (ref 3.5–5.2)
Sodium: 139 mmol/L (ref 134–144)
eGFR: 60 mL/min/{1.73_m2} (ref >59)

## 2021-09-01 MED ORDER — LOSARTAN POTASSIUM 25 MG PO TABS: 25.0000 mg | ORAL_TABLET | Freq: Every day | ORAL | 3 refills | Status: DC

## 2021-09-01 NOTE — Telephone Encounter (Signed)
Spoke to patient regarding her labs done 12/29 and relayed message that [place], [place] would like to put patient on Losartan 25mg  QD for blood pressure and would like to redraw BMP in 2 weeks to evaluate effects. Kathlen Mody also requested that pt send in her BP readings for the next 2 weeks, pt agrees that she will send via MyChart or bring with her when she comes for blood draw. This RN will send in medication and schedule her appt for blood work. Pt understands plan of care, confirms that she is NOT taking HCTZ any longer and will submit BP recordings to [place]. Judson Roch, RN

## 2021-09-01 NOTE — Telephone Encounter (Signed)
-----   Message from Liliane Shi, [place] sent at 09/01/2021 11:29 AM EST ----- Na back to normal.  Creatinine, K+ normal.   PLAN:  - Start Losartan 25 mg once daily (for blood pressure since HCTZ was DC'd) - BMET 2 weeks - Send BP readings in 2 weeks Richardson Dopp, PA-C    09/01/2021 11:24 AM

## 2021-09-06 ENCOUNTER — Telehealth: Payer: Self-pay

## 2021-09-06 ENCOUNTER — Telehealth: Payer: Self-pay | Admitting: Internal Medicine

## 2021-09-06 NOTE — Telephone Encounter (Signed)
Patient inquiring if she should still take Vitamin D, Ergocalciferol, (DRISDOL) 1.25 MG (50000 UNIT) CAPS capsule  Patient states if she should still take the medication, she would like to request a refill  Patient was last seen by Dr. Sharlet Salina on  05-12-2021  Pharmacy [place], [place] - [address]

## 2021-09-06 NOTE — Progress Notes (Signed)
° ° °  Chronic Care Management Pharmacy Assistant   Name: KIMYATA MILICH  MRN: [id number] DOB: 16-Dec-1940  Patient called in today stating that she received a delivery on 09/01/21 from [facility]. She stated that her metoprolol, amlodipine, losartan and celebrex was not in her package. I filled out an Acute form for the patient and forward form to the pharmacy for a 09/07/21  [place] Pharmacist Assistant [phone]

## 2021-09-07 NOTE — Telephone Encounter (Signed)
Patient's next ov on 10-04-2021

## 2021-09-13 LAB — HM DIABETES EYE EXAM

## 2021-09-19 ENCOUNTER — Other Ambulatory Visit: Payer: PPO | Admitting: *Deleted

## 2021-09-19 ENCOUNTER — Other Ambulatory Visit: Payer: Self-pay

## 2021-09-19 ENCOUNTER — Telehealth: Payer: Self-pay | Admitting: Internal Medicine

## 2021-09-19 DIAGNOSIS — I10 Essential (primary) hypertension: Secondary | ICD-10-CM

## 2021-09-19 NOTE — Telephone Encounter (Signed)
N/A unable to leave a message for patient to call back to schedule Medicare Annual Wellness Visit  ° °Last AWV  09/12/20 ° °Please schedule at anytime with [facility]-Nurse Health Advisor if patient calls the office back.   ° °40 Minutes appointment  ° °Any questions, please call me at [phone]  °

## 2021-09-20 LAB — BASIC METABOLIC PANEL
BUN/Creatinine Ratio: 19 (ref 12–28)
BUN: 19 mg/dL (ref 8–27)
CO2: 27 mmol/L (ref 20–29)
Calcium: 8.8 mg/dL (ref 8.7–10.3)
Chloride: 101 mmol/L (ref 96–106)
Creatinine, Ser: 1.01 mg/dL — ABNORMAL HIGH (ref 0.57–1.00)
Glucose: 196 mg/dL — ABNORMAL HIGH (ref 70–99)
Potassium: 4.2 mmol/L (ref 3.5–5.2)
Sodium: 142 mmol/L (ref 134–144)
eGFR: 56 mL/min/{1.73_m2} — ABNORMAL LOW (ref >59)

## 2021-09-22 ENCOUNTER — Telehealth: Payer: Self-pay | Admitting: Physician Assistant

## 2021-09-22 DIAGNOSIS — I10 Essential (primary) hypertension: Secondary | ICD-10-CM

## 2021-09-22 DIAGNOSIS — Z79899 Other long term (current) drug therapy: Secondary | ICD-10-CM

## 2021-09-22 MED ORDER — LOSARTAN POTASSIUM 25 MG PO TABS
50.0000 mg | ORAL_TABLET | Freq: Every day | ORAL | 3 refills | Status: DC
Start: 2021-09-22 — End: 2021-10-26

## 2021-09-22 NOTE — Telephone Encounter (Signed)
Received home BP readings. BPs look better.  Most are still just a little above goal. PLAN:  Increase Losartan to 50 mg once daily BMET 1 month Richardson Dopp, PA-C    09/22/2021 1:15 PM

## 2021-09-22 NOTE — Telephone Encounter (Signed)
Pt aware of recommendations and verbalizes understanding Pt will take two of the 25 mg of the Losartan and if tolerates will call back for the 50 mg to be sent in ./cy

## 2021-10-04 ENCOUNTER — Other Ambulatory Visit: Payer: Self-pay

## 2021-10-04 ENCOUNTER — Encounter: Payer: Self-pay | Admitting: Internal Medicine

## 2021-10-04 ENCOUNTER — Ambulatory Visit (INDEPENDENT_AMBULATORY_CARE_PROVIDER_SITE_OTHER): Payer: PPO | Admitting: Internal Medicine

## 2021-10-04 VITALS — BP 144/70 | HR 89 | Temp 98.7°F | Ht 59.0 in | Wt 125.4 lb

## 2021-10-04 DIAGNOSIS — E118 Type 2 diabetes mellitus with unspecified complications: Secondary | ICD-10-CM | POA: Diagnosis not present

## 2021-10-04 DIAGNOSIS — E785 Hyperlipidemia, unspecified: Secondary | ICD-10-CM | POA: Diagnosis not present

## 2021-10-04 DIAGNOSIS — N1832 Chronic kidney disease, stage 3b: Secondary | ICD-10-CM | POA: Diagnosis not present

## 2021-10-04 DIAGNOSIS — I10 Essential (primary) hypertension: Secondary | ICD-10-CM

## 2021-10-04 DIAGNOSIS — E1142 Type 2 diabetes mellitus with diabetic polyneuropathy: Secondary | ICD-10-CM | POA: Diagnosis not present

## 2021-10-04 DIAGNOSIS — Z0001 Encounter for general adult medical examination with abnormal findings: Secondary | ICD-10-CM

## 2021-10-04 DIAGNOSIS — Z23 Encounter for immunization: Secondary | ICD-10-CM

## 2021-10-04 LAB — LIPID PANEL
Cholesterol: 109 mg/dL (ref 0–200)
HDL: 48 mg/dL (ref >39.00)
LDL Cholesterol: 39 mg/dL (ref 0–99)
NonHDL: 61.26
Total CHOL/HDL Ratio: 2
Triglycerides: 110 mg/dL (ref 0.0–149.0)
VLDL: 22 mg/dL (ref 0.0–40.0)

## 2021-10-04 LAB — HEMOGLOBIN A1C: Hgb A1c MFr Bld: 7.3 % — ABNORMAL HIGH (ref 4.6–6.5)

## 2021-10-04 LAB — URINALYSIS, ROUTINE W REFLEX MICROSCOPIC
Bilirubin Urine: NEGATIVE
Hgb urine dipstick: NEGATIVE
Ketones, ur: NEGATIVE
Nitrite: POSITIVE — AB
RBC / HPF: NONE SEEN (ref 0–?)
Specific Gravity, Urine: 1.01 (ref 1.000–1.030)
Total Protein, Urine: NEGATIVE
Urine Glucose: NEGATIVE
Urobilinogen, UA: 0.2 (ref 0.0–1.0)
pH: 6.5 (ref 5.0–8.0)

## 2021-10-04 LAB — BASIC METABOLIC PANEL
BUN: 16 mg/dL (ref 6–23)
CO2: 35 mEq/L — ABNORMAL HIGH (ref 19–32)
Calcium: 9.4 mg/dL (ref 8.4–10.5)
Chloride: 100 mEq/L (ref 96–112)
Creatinine, Ser: 0.93 mg/dL (ref 0.40–1.20)
GFR: 57.47 mL/min — ABNORMAL LOW (ref >60.00)
Glucose, Bld: 163 mg/dL — ABNORMAL HIGH (ref 70–99)
Potassium: 4.5 mEq/L (ref 3.5–5.1)
Sodium: 139 mEq/L (ref 135–145)

## 2021-10-04 LAB — MICROALBUMIN / CREATININE URINE RATIO
Creatinine,U: 60.8 mg/dL
Microalb Creat Ratio: 4.2 mg/g (ref 0.0–30.0)
Microalb, Ur: 2.5 mg/dL — ABNORMAL HIGH (ref 0.0–1.9)

## 2021-10-04 NOTE — Progress Notes (Signed)
Subjective:  Patient ID: [name] [name], female    DOB: 30-Mar-1941  Age: 81 y.o. MRN: [id number]  CC: Annual Exam, Hypertension, Hyperlipidemia, and Diabetes  This visit occurred during the SARS-CoV-2 public health emergency.  Safety protocols were in place, including screening questions prior to the visit, additional usage of staff PPE, and extensive cleaning of exam room while observing appropriate contact time as indicated for disinfecting solutions.    HPI LARIA GRIMMETT presents for a CPX and f/up -   She denies chest pain, shortness of breath, diaphoresis, dizziness, lightheadedness, or edema.  Outpatient Medications Prior to Visit  Medication Sig Dispense Refill   amLODipine (NORVASC) 10 MG tablet Take 1 tablet (10 mg total) by mouth daily after breakfast. 30 tablet 11   Ascorbic Acid (VITAMIN C) 500 MG CAPS Take 500 mg by mouth in the morning and at bedtime.     aspirin EC 81 MG tablet Take 81 mg by mouth every morning.      atorvastatin (LIPITOR) 80 MG tablet TAKE ONE TABLET BY MOUTH EVERYDAY AT BEDTIME 90 tablet 0   celecoxib (CELEBREX) 50 MG capsule TAKE ONE CAPSULE BY MOUTH TWICE DAILY 180 capsule 0   Cinnamon 500 MG TABS Take 1,000 mg by mouth daily.     clopidogrel (PLAVIX) 75 MG tablet TAKE ONE TABLET BY MOUTH EVERY MORNING 90 tablet 0   dexlansoprazole (DEXILANT) 60 MG capsule TAKE ONE CAPSULE BY MOUTH EVERY MORNING 90 capsule 0   dicyclomine (BENTYL) 10 MG capsule TAKE ONE CAPSULE BY MOUTH FOUR TIMES DAILY 360 capsule 0   ezetimibe (ZETIA) 10 MG tablet TAKE ONE TABLET BY MOUTH EVERY MORNING 90 tablet 0   gabapentin (NEURONTIN) 300 MG capsule TAKE TWO CAPSULES BY MOUTH EVERY MORNING and TAKE TWO CAPSULES BY MOUTH EVERYDAY AT BEDTIME 360 capsule 1   Krill Oil 1000 MG CAPS Take 1,000 mg by mouth daily.     losartan (COZAAR) 25 MG tablet Take 2 tablets (50 mg total) by mouth daily. 90 tablet 3   magnesium oxide (MAG-OX) 400 MG tablet Take 400 mg by mouth daily.      metoprolol succinate (TOPROL-XL) 50 MG 24 hr tablet Take 1 tablet (50 mg total) by mouth at bedtime. 90 tablet 3   Misc Natural Products (FOCUSED MIND PO) Take 15 mg by mouth daily.     nitroGLYCERIN (NITROSTAT) 0.4 MG SL tablet Place 1 tablet (0.4 mg total) under the tongue every 5 (five) minutes as needed for chest pain. 25 tablet 2   omega-3 acid ethyl esters (LOVAZA) 1 g capsule Take 1 g by mouth 2 (two) times daily.     Psyllium (DAILY FIBER PO) Take 1 capsule by mouth in the morning and at bedtime.     saccharomyces boulardii (FLORASTOR) 250 MG capsule Take 250 mg by mouth 2 (two) times daily.     TURMERIC PO Take 1,000 mg by mouth in the morning and at bedtime.     venlafaxine XR (EFFEXOR-XR) 37.5 MG 24 hr capsule TAKE ONE CAPSULE BY MOUTH EVERY MORNING 90 capsule 0   Vitamin D, Ergocalciferol, (DRISDOL) 1.25 MG (50000 UNIT) CAPS capsule Take 1 capsule (50,000 Units total) by mouth every 7 (seven) days. 12 capsule 0   vitamin E 1000 UNIT capsule Take 1,000 Units by mouth in the morning and at bedtime.     No facility-administered medications prior to visit.    ROS Review of Systems  Constitutional:  Negative for diaphoresis, fatigue and  unexpected weight change.  HENT: Negative.    Eyes: Negative.   Respiratory:  Negative for cough, chest tightness, shortness of breath and wheezing.   Cardiovascular:  Negative for chest pain, palpitations and leg swelling.  Gastrointestinal:  Negative for abdominal pain, constipation, diarrhea and nausea.  Endocrine: Negative.   Genitourinary: Negative.  Negative for difficulty urinating.  Musculoskeletal: Negative.  Negative for arthralgias and myalgias.  Skin: Negative.   Neurological: Negative.  Negative for dizziness, weakness, light-headedness and headaches.  Hematological:  Negative for adenopathy. Does not bruise/bleed easily.  Psychiatric/Behavioral: Negative.     Objective:  BP (!) 144/70 (BP Location: Right Arm, Patient Position:  Sitting, Cuff Size: Large)    Pulse 89    Temp 98.7 F (37.1 C) (Oral)    Ht 4\' 11"  (1.499 m)    Wt 125 lb 6 oz (56.9 kg)    SpO2 93%    BMI 25.32 kg/m   BP Readings from Last 3 Encounters:  10/04/21 (!) 144/70  07/25/21 (!) 150/70  07/24/21 (!) 160/70    Wt Readings from Last 3 Encounters:  10/04/21 125 lb 6 oz (56.9 kg)  07/25/21 127 lb (57.6 kg)  07/24/21 127 lb 12.8 oz (58 kg)    Physical Exam Vitals reviewed.  Constitutional:      Appearance: Normal appearance.  HENT:     Nose: Nose normal.     Mouth/Throat:     Mouth: Mucous membranes are moist.     Pharynx: No oropharyngeal exudate.  Eyes:     General: No scleral icterus.    Conjunctiva/sclera: Conjunctivae normal.  Cardiovascular:     Rate and Rhythm: Normal rate and regular rhythm.     Heart sounds: No murmur heard. Pulmonary:     Effort: Pulmonary effort is normal.     Breath sounds: No stridor. No wheezing, rhonchi or rales.  Abdominal:     General: Abdomen is flat.     Palpations: There is no mass.     Tenderness: There is no abdominal tenderness. There is no guarding.     Hernia: No hernia is present.  Musculoskeletal:        General: Normal range of motion.     Cervical back: Neck supple.     Right lower leg: No edema.     Left lower leg: No edema.  Lymphadenopathy:     Cervical: No cervical adenopathy.  Skin:    General: Skin is warm and dry.     Coloration: Skin is not pale.  Neurological:     General: No focal deficit present.     Mental Status: She is alert.  Psychiatric:        Mood and Affect: Mood normal.        Behavior: Behavior normal.    Lab Results  Component Value Date   WBC 8.4 06/29/2021   HGB 13.1 06/29/2021   HCT 40.7 06/29/2021   PLT 216 06/29/2021   GLUCOSE 163 (H) 10/04/2021   CHOL 109 10/04/2021   TRIG 110.0 10/04/2021   HDL 48.00 10/04/2021   LDLDIRECT 42.0 09/12/2020   LDLCALC 39 10/04/2021   ALT 24 05/02/2021   AST 28 05/02/2021   NA 139 10/04/2021   K 4.5  10/04/2021   CL 100 10/04/2021   CREATININE 0.93 10/04/2021   BUN 16 10/04/2021   CO2 35 (H) 10/04/2021   TSH 1.54 09/12/2020   INR 1.0 05/02/2021   HGBA1C 7.3 (H) 10/04/2021   MICROALBUR 2.5 (H)  10/04/2021    CARDIAC CATHETERIZATION  Result Date: 06/28/2021   Mid LAD lesion is 30% stenosed.   Prox Cx to Mid Cx lesion is 30% stenosed.   Mid RCA lesion is 10% stenosed.   Prox LAD to Mid LAD lesion is 70% stenosed.   A drug-eluting stent was successfully placed using a STENT ONYX FRONTIER 3.0X18.   Post intervention, there is a 0% residual stenosis.   Patent mid RCA stents Severe proximal LAD stenosis that angiographically appears moderate to severe. RFR assessment with pressure wire of 0.88. Successful PTCA/DES x 1 proximal LAD (RFR guided) Mild to moderate stenosis in the non-dominant mid Circumflex Large dominant RCA with patent mid stents without restenosis Normal LVEDP Recommendations: Continue DAPT with ASA/Plavix. Continue beta blocker and statin.    Assessment & Plan:   Lylian was seen today for annual exam, hypertension, hyperlipidemia and diabetes.  Diagnoses and all orders for this visit:  Essential hypertension- Her blood pressure is adequately well controlled. -     Basic metabolic panel; Future -     Urinalysis, Routine w reflex microscopic; Future -     Urinalysis, Routine w reflex microscopic -     Basic metabolic panel  Controlled type 2 diabetes mellitus with complication, without long-term current use of insulin ([place])- Her blood sugar is adequately well controlled. -     Basic metabolic panel; Future -     Microalbumin / creatinine urine ratio; Future -     Hemoglobin A1c; Future -     Hemoglobin A1c -     Microalbumin / creatinine urine ratio -     Basic metabolic panel  Diabetic peripheral neuropathy ([place])- Her symptoms are well controlled with gabapentin.  Stage 3b chronic kidney disease ([place])- Her renal function is stable. -     Microalbumin / creatinine  urine ratio; Future -     Urinalysis, Routine w reflex microscopic; Future -     Urinalysis, Routine w reflex microscopic -     Microalbumin / creatinine urine ratio  Hyperlipidemia with target LDL less than 100- LDL goal achieved. Doing well on the statin  -     Lipid panel; Future -     Lipid panel  Encounter for general adult medical examination with abnormal findings- Exam completed, labs reviewed, vaccines reviewed and updated, no cancer screenings indicated, patient education was given.  Need for shingles vaccine -     Zoster Vaccine Adjuvanted [facility]) injection; Inject 0.5 mLs into the muscle once for 1 dose.   I am having Caryle F. Brammer "FAYE" start on Shingrix. I am also having her maintain her Vitamin C, aspirin EC, Krill Oil, nitroGLYCERIN, Cinnamon, TURMERIC PO, magnesium oxide, vitamin E, saccharomyces boulardii, Psyllium (DAILY FIBER PO), omega-3 acid ethyl esters, Misc Natural Products (FOCUSED MIND PO), Vitamin D (Ergocalciferol), amLODipine, metoprolol succinate, gabapentin, atorvastatin, dicyclomine, dexlansoprazole, clopidogrel, venlafaxine XR, ezetimibe, celecoxib, and losartan.  Meds ordered this encounter  Medications   Zoster Vaccine Adjuvanted [facility]) injection    Sig: Inject 0.5 mLs into the muscle once for 1 dose.    Dispense:  0.5 mL    Refill:  1     Follow-up: Return in about 6 months (around 04/03/2022).  [name], MD

## 2021-10-04 NOTE — Patient Instructions (Signed)

## 2021-10-05 ENCOUNTER — Telehealth: Payer: Self-pay

## 2021-10-05 DIAGNOSIS — Z0279 Encounter for issue of other medical certificate: Secondary | ICD-10-CM

## 2021-10-05 NOTE — Telephone Encounter (Signed)
Pts daughter, Theodis Aguas, dropped off FMLA for herself to care for her mother.  Rise Paganini is requesting intermittent leave.  Forms have been completed, signed and returned via fax.  Copy sent to charge. Copy sent to pt's daughter via [place]. Original filed with [employer].

## 2021-10-06 NOTE — Telephone Encounter (Signed)
FMLA has been faxed again.  

## 2021-10-06 NOTE — Telephone Encounter (Signed)
Pts daughter states fax received was missing pgs and some pgs were not legible  Caller requesting fmla forms emailed  Email: [email or web address]

## 2021-10-11 DIAGNOSIS — M25561 Pain in right knee: Secondary | ICD-10-CM | POA: Diagnosis not present

## 2021-10-11 DIAGNOSIS — M1711 Unilateral primary osteoarthritis, right knee: Secondary | ICD-10-CM | POA: Diagnosis not present

## 2021-10-11 DIAGNOSIS — M160 Bilateral primary osteoarthritis of hip: Secondary | ICD-10-CM | POA: Diagnosis not present

## 2021-10-11 DIAGNOSIS — M79651 Pain in right thigh: Secondary | ICD-10-CM | POA: Diagnosis not present

## 2021-10-11 MED ORDER — SHINGRIX 50 MCG/0.5ML IM SUSR: 0.5000 mL | Freq: Once | INTRAMUSCULAR | 1 refills | Status: AC

## 2021-10-11 MED ORDER — SHINGRIX 50 MCG/0.5ML IM SUSR: 0.5000 mL | Freq: Once | INTRAMUSCULAR | 1 refills | Status: DC

## 2021-10-13 ENCOUNTER — Ambulatory Visit: Payer: PPO | Admitting: Nurse Practitioner

## 2021-10-23 ENCOUNTER — Other Ambulatory Visit: Payer: Self-pay

## 2021-10-23 ENCOUNTER — Other Ambulatory Visit: Payer: PPO

## 2021-10-23 DIAGNOSIS — I10 Essential (primary) hypertension: Secondary | ICD-10-CM | POA: Diagnosis not present

## 2021-10-23 DIAGNOSIS — Z79899 Other long term (current) drug therapy: Secondary | ICD-10-CM

## 2021-10-24 LAB — BASIC METABOLIC PANEL
BUN/Creatinine Ratio: 17 (ref 12–28)
BUN: 13 mg/dL (ref 8–27)
CO2: 29 mmol/L (ref 20–29)
Calcium: 9.2 mg/dL (ref 8.7–10.3)
Chloride: 101 mmol/L (ref 96–106)
Creatinine, Ser: 0.78 mg/dL (ref 0.57–1.00)
Glucose: 145 mg/dL — ABNORMAL HIGH (ref 70–99)
Potassium: 4.6 mmol/L (ref 3.5–5.2)
Sodium: 141 mmol/L (ref 134–144)
eGFR: 76 mL/min/{1.73_m2} (ref >59)

## 2021-10-26 MED ORDER — LOSARTAN POTASSIUM 50 MG PO TABS: 50.0000 mg | ORAL_TABLET | Freq: Every day | ORAL | 3 refills | Status: DC

## 2021-10-26 NOTE — Telephone Encounter (Signed)
Ok to change to Losartan 50 mg tabs. Richardson Dopp, PA-C    10/26/2021 9:15 AM

## 2021-10-26 NOTE — Telephone Encounter (Signed)
Pt's medication was reordered as losartan 50 mg tablets, 1 tablet daily. Pt notified. Rx sent to the pharmacy. Confirmation received.

## 2021-10-26 NOTE — Addendum Note (Signed)
Addended by: Carter Kitten D on: 10/26/2021 09:38 AM   Modules accepted: Orders

## 2021-10-26 NOTE — Telephone Encounter (Signed)
Pt calling needing a refill on losartan. Pt was told to take losartan 25 mg tablet, 2 tablets daily, but wanted to know if Richardson Dopp, PA wanted her to take losartan 50 mg 1 tablet daily,instead of 2 tablets. Please address

## 2021-11-03 ENCOUNTER — Ambulatory Visit (INDEPENDENT_AMBULATORY_CARE_PROVIDER_SITE_OTHER): Payer: PPO

## 2021-11-03 VITALS — Wt 125.0 lb

## 2021-11-03 DIAGNOSIS — Z0000 Encounter for general adult medical examination without abnormal findings: Secondary | ICD-10-CM

## 2021-11-03 NOTE — Progress Notes (Signed)
I connected with [name] [name] today by telephone and verified that I am speaking with the correct person using two identifiers. Location patient: home Location provider: work Persons participating in the virtual visit: patient, provider.   I discussed the limitations, risks, security and privacy concerns of performing an evaluation and management service by telephone and the availability of in person appointments. I also discussed with the patient that there may be a patient responsible charge related to this service. The patient expressed understanding and verbally consented to this telephonic visit.    Interactive audio and video telecommunications were attempted between this provider and patient, however failed, due to patient having technical difficulties OR patient did not have access to video capability.  We continued and completed visit with audio only.  Some vital signs may be absent or patient reported.   Time Spent with patient on telephone encounter: 40 minutes  Subjective:   [name] [name] is a 81 y.o. female who presents for Medicare Annual (Subsequent) preventive examination.  Review of Systems     Cardiac Risk Factors include: advanced age (>66mn, >>70women);diabetes mellitus;dyslipidemia;family history of premature cardiovascular disease;hypertension     Objective:    There were no vitals filed for this visit. There is no height or weight on file to calculate BMI.  Advanced Directives 11/03/2021 06/28/2021 09/12/2020 06/26/2020 03/09/2020 06/28/2016 04/22/2016  Does Patient Have a Medical Advance Directive? Yes Yes Yes No Yes No Yes  Type of Advance Directive Living will;Healthcare Power of [place] Living will;Healthcare Power of [place] - [place]  Does patient want to make changes to medical advance directive? No - Patient declined No - Patient declined  No - Patient declined - - - No - Patient declined  Copy of [place] Chart? No - copy requested No - copy requested No - copy requested - - - Yes  Would patient like information on creating a medical advance directive? - - - - - - -  Pre-existing out of facility DNR order (yellow form or pink MOST form) - - - - - - -    Current Medications (verified) Outpatient Encounter Medications as of 11/03/2021  Medication Sig   amLODipine (NORVASC) 10 MG tablet Take 1 tablet (10 mg total) by mouth daily after breakfast.   Ascorbic Acid (VITAMIN C) 500 MG CAPS Take 500 mg by mouth in the morning and at bedtime.   aspirin EC 81 MG tablet Take 81 mg by mouth every morning.    atorvastatin (LIPITOR) 80 MG tablet TAKE ONE TABLET BY MOUTH EVERYDAY AT BEDTIME   celecoxib (CELEBREX) 50 MG capsule TAKE ONE CAPSULE BY MOUTH TWICE DAILY   Cinnamon 500 MG TABS Take 1,000 mg by mouth daily.   clopidogrel (PLAVIX) 75 MG tablet TAKE ONE TABLET BY MOUTH EVERY MORNING   dexlansoprazole (DEXILANT) 60 MG capsule TAKE ONE CAPSULE BY MOUTH EVERY MORNING   dicyclomine (BENTYL) 10 MG capsule TAKE ONE CAPSULE BY MOUTH FOUR TIMES DAILY   ezetimibe (ZETIA) 10 MG tablet TAKE ONE TABLET BY MOUTH EVERY MORNING   gabapentin (NEURONTIN) 300 MG capsule TAKE TWO CAPSULES BY MOUTH EVERY MORNING and TAKE TWO CAPSULES BY MOUTH EVERYDAY AT BEDTIME   Krill Oil 1000 MG CAPS Take 1,000 mg by mouth daily.   losartan (COZAAR) 50 MG tablet Take 1 tablet (50 mg total) by mouth daily.   magnesium oxide (MAG-OX) 400 MG tablet  Take 400 mg by mouth daily.   metoprolol succinate (TOPROL-XL) 50 MG 24 hr tablet Take 1 tablet (50 mg total) by mouth at bedtime.   Misc Natural Products (FOCUSED MIND PO) Take 15 mg by mouth daily.   nitroGLYCERIN (NITROSTAT) 0.4 MG SL tablet Place 1 tablet (0.4 mg total) under the tongue every 5 (five) minutes as needed for chest pain.   omega-3 acid ethyl esters (LOVAZA) 1 g capsule Take 1 g by mouth  2 (two) times daily.   Psyllium (DAILY FIBER PO) Take 1 capsule by mouth in the morning and at bedtime.   saccharomyces boulardii (FLORASTOR) 250 MG capsule Take 250 mg by mouth 2 (two) times daily.   TURMERIC PO Take 1,000 mg by mouth in the morning and at bedtime.   venlafaxine XR (EFFEXOR-XR) 37.5 MG 24 hr capsule TAKE ONE CAPSULE BY MOUTH EVERY MORNING   Vitamin D, Ergocalciferol, (DRISDOL) 1.25 MG (50000 UNIT) CAPS capsule Take 1 capsule (50,000 Units total) by mouth every 7 (seven) days.   vitamin E 1000 UNIT capsule Take 1,000 Units by mouth in the morning and at bedtime.   No facility-administered encounter medications on file as of 11/03/2021.    Allergies (verified) Nitrofurantoin, Niacin and related, Ceftriaxone, Codeine, Prednisone, Sulfonamide derivatives, and Tetracycline   History: Past Medical History:  Diagnosis Date   Allergy    Anal fissure    Anxiety    CAD (coronary artery disease)    s/p inf MI 2007 - tx w/ DES to RCA // s/p POBA to [facility] and DES to [facility] in 11/18 ([place] in [place], [place]) // Myoview 3/21: low risk // s/p DES to pRCA   Cataract    removed both eyes   Cerebrovascular disease    Carotid US 09/2017 [facility]): mild plaque (<50%) in both carotid arteries   Chronic neck pain    Chronic pain syndrome    Diabetes Mellitus, Type 2    Diverticular disease    DJD (degenerative joint disease)    Echocardiogram 10/2018    Echo 10/2018: EF 55-60, normal RVSF, mod MAC, mod TR, severe AoV calcification and sclerosis with nodular calcium/mobile area of calcium in the LVOT (small veg vs Lambl's excrescence  - consider TEE), mild AI, mild AS (mean 11).    Echocardiogram 04/2019    Echocardiogram 04/2019: EF 60-65, basal septal hypertrophy, grade 2 diastolic dysfunction, normal wall motion, normal RV SF, mild LAE, mod MAC, trivial MR, mod sclerosis of the aortic valve with mod aortic annular calcification, thin mobile filamentous structure on ventricular side of AV  likely representing Lambl's excrescence, mild AI, mild TR   Frequent UTI's    Gastroesophageal reflux disease    H/O bacterial endocarditis    Rx w IV Abxs in 2020 ([facility] in [place], [place]) // F/u echo with AoV Lambl's excrescence   H/O hiatal hernia    HTN (hypertension)    Hx of fall 10/2020   Hx of MI 2007   Hx of Stroke    IBS (irritable bowel syndrome)    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 (Cervical Fusion)   Premature ventricular  contractions    Tubular adenoma of colon    Ulcer    [facility]   Urinary incontinence    Past Surgical History:  Procedure Laterality Date   APPENDECTOMY  1955   BLADDER REPAIR  2007   CARDIAC CATHETERIZATION  2007   Stents   CERVICAL SPINE SURGERY  07/2005   Dr Consuello Masse   CHOLECYSTECTOMY  2007   COLONOSCOPY     CORONARY PRESSURE WIRE/FFR WITH 3D MAPPING N/A 06/28/2021   Procedure: Coronary Pressure Wire/FFR w/3D Mapping;  Surgeon: Burnell Blanks, MD;  Location: [place] [facility];  Service: Cardiovascular;  Laterality: N/A;   CORONARY STENT INTERVENTION N/A 03/09/2020   Procedure: CORONARY STENT INTERVENTION;  Surgeon: Burnell Blanks, MD;  Location: [place] [facility];  Service: Cardiovascular;  Laterality: N/A;   CORONARY STENT INTERVENTION N/A 06/28/2021   Procedure: CORONARY STENT INTERVENTION;  Surgeon: Burnell Blanks, MD;  Location: [place] [facility];  Service: Cardiovascular;  Laterality: N/A;   DENTAL SURGERY  05/2013   replaced an inplant   EYE SURGERY  2011   Bilteral   I & D KNEE WITH POLY EXCHANGE  09/25/2011   Procedure: IRRIGATION AND DEBRIDEMENT KNEE WITH POLY EXCHANGE;  Surgeon: Johnny Bridge, MD;   Location: [place];  Service: Orthopedics;  Laterality: Left;   INCISION AND DRAINAGE ABSCESS / HEMATOMA OF BURSA / KNEE / THIGH  09/2011   JOINT REPLACEMENT  2012   left   LEFT HEART CATH AND CORONARY ANGIOGRAPHY N/A 03/09/2020   Procedure: LEFT HEART CATH AND CORONARY ANGIOGRAPHY;  Surgeon: Burnell Blanks, MD;  Location: [place] [facility];  Service: Cardiovascular;  Laterality: N/A;   LEFT HEART CATH AND CORONARY ANGIOGRAPHY N/A 06/28/2021   Procedure: LEFT HEART CATH AND CORONARY ANGIOGRAPHY;  Surgeon: Burnell Blanks, MD;  Location: [place] [facility];  Service: Cardiovascular;  Laterality: N/A;   left Total Knee Replacement  11/2010   Dr Percell Miller   NECK SURGERY     has had 3 surgeries   POLYPECTOMY     POSTERIOR CERVICAL FUSION/FORAMINOTOMY  04/08/2012   Procedure: POSTERIOR CERVICAL FUSION/FORAMINOTOMY LEVEL 2;  Surgeon: Otilio Connors, MD;  Location: [place] [facility];  Service: Neurosurgery;  Laterality: Left;  Left Cervical six-seven Foraminotomy, bilateral cervical seven-thoracic one foraminotomy, cervical six-seven, cervical seven-thoracic one fusion with posterior instrumentation   TEMPOROMANDIBULAR JOINT SURGERY     thumb surgery     TONSILLECTOMY     TONSILLECTOMY  1950   TOTAL ABDOMINAL HYSTERECTOMY     Family History  Problem Relation Age of Onset   Heart disease Mother    Pancreatic cancer Mother    Lung cancer Brother    Cancer - Other Brother        vocal cord cancer   Colon polyps Sister    Coronary artery disease Other        sibling   Coronary artery disease Other        sibling   Colon cancer Neg Hx    Esophageal cancer Neg Hx    Rectal cancer Neg Hx    Stomach cancer Neg Hx    Social History   Socioeconomic History   Marital status: Widowed    Spouse name: dolly Kendle   Number of children: Not on file   Years of education: Not on file   Highest education level: Not on file  Occupational History    Employer: RETIRED  Tobacco Use  Smoking  status: Never   Smokeless tobacco: Never  Vaping Use   Vaping Use: Never used  Substance and Sexual Activity   Alcohol use: No   Drug use: No   Sexual activity: Yes  Other Topics Concern   Not on file  Social History Narrative   Pt never knew her father   Daily caffeine use   Social Determinants of Health   Financial Resource Strain: Low Risk    Difficulty of Paying Living Expenses: Not hard at all  Food Insecurity: No Food Insecurity   Worried About Charity fundraiser in the Last Year: Never true   [place] in the Last Year: Never true  Transportation Needs: No Transportation Needs   Lack of Transportation (Medical): No   Lack of Transportation (Non-Medical): No  Physical Activity: Sufficiently Active   Days of Exercise per Week: 5 days   Minutes of Exercise per Session: 30 min  Stress: No Stress Concern Present   Feeling of Stress : Not at all  Social Connections: Moderately Integrated   Frequency of Communication with Friends and Family: More than three times a week   Frequency of Social Gatherings with Friends and Family: More than three times a week   Attends Religious Services: More than 4 times per year   Active Member of [employer] or Organizations: Yes   Attends Archivist Meetings: More than 4 times per year   Marital Status: Widowed    Tobacco Counseling Counseling given: Not Answered   Clinical Intake:  Pre-visit preparation completed: Yes  Pain : No/denies pain     Nutritional Risks: None Diabetes: Yes CBG done?: No Did pt. bring in CBG monitor from home?: No  How often do you need to have someone help you when you read instructions, pamphlets, or other written materials from your doctor or pharmacy?: 1 - Never What is the last grade level you completed in school?: 10th grade  Diabetic? yes  Interpreter Needed?: No  Information entered by :: Lisette Abu, LPN   Activities of Daily Living In your present state of health,  do you have any difficulty performing the following activities: 11/03/2021 06/28/2021  Hearing? N N  Vision? N N  Difficulty concentrating or making decisions? N N  Walking or climbing stairs? N N  Dressing or bathing? N N  Doing errands, shopping? N N  Preparing Food and eating ? N -  Using the Toilet? N -  In the past six months, have you accidently leaked urine? Y -  Do you have problems with loss of bowel control? N -  Managing your Medications? N -  Managing your Finances? N -  Housekeeping or managing your Housekeeping? N -  Some recent data might be hidden    Patient Care Team: Janith Lima, MD as PCP - General (Internal Medicine) Sherren Mocha, MD as PCP - Cardiology (Cardiology) Sherren Mocha, MD as Consulting Physician (Cardiology) Tat, Eustace Quail, DO as Consulting Physician (Neurology) Marchia Bond, MD as Consulting Physician (Orthopedic Surgery) Sharmon Revere as Physician Assistant (Cardiology) Szabat, Darnelle Maffucci, [facility] as Pharmacist (Pharmacist) Lynnell Dike, OD (Optometry)  Indicate any recent Medical Services you may have received from other than [facility] providers in the past year (date may be approximate).     Assessment:   This is a routine wellness examination for [facility].  Hearing/Vision screen Hearing Screening - Comments:: Patient denied any hearing difficulty.   No hearing aids.  Vision  Screening - Comments:: Patient wears corrective glasses/contacts.   Eye exam done annually by: [facility] (Dr. Cleon Gustin) and Darleen Crocker, MD.  Dietary issues and exercise activities discussed: Current Exercise Habits: Home exercise routine, Type of exercise: walking, Time (Minutes): 30, Frequency (Times/Week): 5, Weekly Exercise (Minutes/Week): 150, Intensity: Mild, Exercise limited by: cardiac condition(s)   Goals Addressed   None   Depression Screen PHQ 2/9 Scores 11/03/2021 10/04/2021 03/13/2021 09/12/2020 03/03/2020 04/30/2019 07/15/2018  PHQ - 2  Score 0 0 0 0 0 0 0  PHQ- 9 Score - 1 0 - - 0 1    Fall Risk Fall Risk  11/03/2021 03/13/2021 09/12/2020 03/03/2020 04/30/2019  Falls in the past year? _0 0 0  Number falls in past yr: 0 0 0 0 0  Comment - - - - -  Injury with Fall? 0 0 0 0 0  Risk Factor Category  - - - - -  Risk for fall due to : Impaired balance/gait - - No Fall Risks;Impaired balance/gait -  Risk for fall due to: Comment - - - - -  Follow up Falls evaluation completed - - Falls evaluation completed Falls evaluation completed    [place]:  Any stairs in or around the home? Yes  If so, are there any without handrails? No  Home free of loose throw rugs in walkways, pet beds, electrical cords, etc? Yes  Adequate lighting in your home to reduce risk of falls? Yes   ASSISTIVE DEVICES UTILIZED TO PREVENT FALLS:  Life alert? No  Use of a cane, walker or w/c? Yes  Grab bars in the bathroom? No  Shower chair or bench in shower? Yes  Elevated toilet seat or a handicapped toilet? No   TIMED UP AND GO:  Was the test performed? No .  Length of time to ambulate 10 feet: n/a sec.   Gait steady and fast with assistive device  Cognitive Function: Normal cognitive status assessed by direct observation by this Nurse Health Advisor. No abnormalities found.          Immunizations Immunization History  Administered Date(s) Administered   Fluad Quad(high Dose 65+) 04/30/2019, 06/27/2020, 05/12/2021   Influenza Split 07/18/2011, 05/19/2012   Influenza Whole 06/22/2009, 05/15/2010   Influenza, High Dose Seasonal PF 07/20/2015, 05/24/2016, 06/18/2018   Influenza,inj,Quad PF,6+ Mos 06/15/2013, 05/21/2014   Influenza-Unspecified 06/24/2017   Pneumococcal Conjugate-13 01/20/2014   Pneumococcal Polysaccharide-23 11/08/2014, 11/13/2017   Tdap 01/20/2014   Zoster, Live 07/08/2014    TDAP status: Up to date  Flu Vaccine status: Up to date  Pneumococcal vaccine status: Up to  date  Covid-19 vaccine status: Declined, Education has been provided regarding the importance of this vaccine but patient still declined. Advised may receive this vaccine at local pharmacy or Health Dept.or vaccine clinic. Aware to provide a copy of the vaccination record if obtained from local pharmacy or Health Dept. Verbalized acceptance and understanding.  Qualifies for Shingles Vaccine? Yes   Zostavax completed Yes   Shingrix Completed?: No.    Education has been provided regarding the importance of this vaccine. Patient has been advised to call insurance company to determine out of pocket expense if they have not yet received this vaccine. Advised may also receive vaccine at local pharmacy or Health Dept. Verbalized acceptance and understanding.  Screening Tests Health Maintenance  Topic Date Due   COVID-19 Vaccine (1) Never done   Zoster Vaccines- Shingrix (1 of 2)  Never done   FOOT EXAM  03/13/2022   OPHTHALMOLOGY EXAM  09/13/2022   TETANUS/TDAP  01/21/2024   Pneumonia Vaccine 63+ Years old  Completed   INFLUENZA VACCINE  Completed   DEXA SCAN  Completed   HPV VACCINES  Aged Out    Health Maintenance  Health Maintenance Due  Topic Date Due   COVID-19 Vaccine (1) Never done   Zoster Vaccines- Shingrix (1 of 2) Never done    Colorectal cancer screening: No longer required.   Mammogram status: No longer required due to age.  Bone density: last done 07/29/2012; no longer recommended  Lung Cancer Screening: (Low Dose CT Chest recommended if Age 20-80 years, 30 pack-year currently smoking OR have quit w/in 15years.) does not qualify.   Lung Cancer Screening Referral: no  Additional Screening:  Hepatitis C Screening: does not qualify; Completed no  Vision Screening: Recommended annual ophthalmology exams for early detection of glaucoma and other disorders of the eye. Is the patient up to date with their annual eye exam?  Yes  Who is the provider or what is the name of  the office in which the patient attends annual eye exams? [place], [place]. If pt is not established with a provider, would they like to be referred to a provider to establish care? No .   Dental Screening: Recommended annual dental exams for proper oral hygiene  Community Resource Referral / Chronic Care Management: CRR required this visit?  No   CCM required this visit?  No      Plan:     I have personally reviewed and noted the following in the patients chart:   Medical and social history Use of alcohol, tobacco or illicit drugs  Current medications and supplements including opioid prescriptions.  Functional ability and status Nutritional status Physical activity Advanced directives List of other physicians Hospitalizations, surgeries, and ER visits in previous 12 months Vitals Screenings to include cognitive, depression, and falls Referrals and appointments  In addition, I have reviewed and discussed with patient certain preventive protocols, quality metrics, and best practice recommendations. A written personalized care plan for preventive services as well as general preventive health recommendations were provided to patient.     Sheral Flow, LPN   09/03/5724   Nurse Notes:  Patient is cogitatively intact. There were no vitals filed for this visit. There is no height or weight on file to calculate BMI. Patient provided her weight: 125 lbs.

## 2021-11-03 NOTE — Patient Instructions (Signed)
[name] , Thank you for taking time to come for your Medicare Wellness Visit. I appreciate your ongoing commitment to your health goals. Please review the following plan we discussed and let me know if I can assist you in the future.   Screening recommendations/referrals: Colonoscopy: Not recommended due to age 81: Not recommended due to age Bone Density: Not recommended due to age Recommended yearly ophthalmology/optometry visit for glaucoma screening and checkup Recommended yearly dental visit for hygiene and checkup  Vaccinations:  Influenza vaccine: 05/12/2021 Pneumococcal vaccine: 01/20/2014, 11/13/2017 Tdap vaccine: 01/20/2014; due every 10 years Shingles vaccine: never done   Covid-19: never done  Advanced directives: Please bring a copy of your health care power of attorney and living will to the office at your convenience.  Conditions/risks identified: Yes; Client understands the importance of follow-up appointments with providers by attending scheduled visits and discussed goals to eat healthier, increase physical activity 5 times a week for 30 minutes each, exercise the brain by doing stimulating brain exercises (reading, adult coloring, crafting, listening to music, puzzles, etc.), socialize and enjoy life more, get enough sleep at least 8-9 hours average per night and make time for laughter.  Next appointment: 11/05/2022 at 10:00 a.m. telephone visit with Mignon Pine, Nurse Health Advisor.  If need to reschedule or cancel please call [phone].   Preventive Care 89 Years and Older, Female Preventive care refers to lifestyle choices and visits with your health care provider that can promote health and wellness. What does preventive care include? A yearly physical exam. This is also called an annual well check. Dental exams once or twice a year. Routine eye exams. Ask your health care provider how often you should have your eyes checked. Personal lifestyle choices,  including: Daily care of your teeth and gums. Regular physical activity. Eating a healthy diet. Avoiding tobacco and drug use. Limiting alcohol use. Practicing safe sex. Taking low-dose aspirin every day. Taking vitamin and mineral supplements as recommended by your health care provider. What happens during an annual well check? The services and screenings done by your health care provider during your annual well check will depend on your age, overall health, lifestyle risk factors, and family history of disease. Counseling  Your health care provider may ask you questions about your: Alcohol use. Tobacco use. Drug use. Emotional well-being. Home and relationship well-being. Sexual activity. Eating habits. History of falls. Memory and ability to understand (cognition). Work and work Statistician. Reproductive health. Screening  You may have the following tests or measurements: Height, weight, and BMI. Blood pressure. Lipid and cholesterol levels. These may be checked every 5 years, or more frequently if you are over 55 years old. Skin check. Lung cancer screening. You may have this screening every year starting at age 36 if you have a 30-pack-year history of smoking and currently smoke or have quit within the past 15 years. Fecal occult blood test (FOBT) of the stool. You may have this test every year starting at age 11. Flexible sigmoidoscopy or colonoscopy. You may have a sigmoidoscopy every 5 years or a colonoscopy every 10 years starting at age 45. Hepatitis C blood test. Hepatitis B blood test. Sexually transmitted disease (STD) testing. Diabetes screening. This is done by checking your blood sugar (glucose) after you have not eaten for a while (fasting). You may have this done every 1-3 years. Bone density scan. This is done to screen for osteoporosis. You may have this done starting at age 42. Mammogram. This may be done  every 1-2 years. Talk to your health care provider  about how often you should have regular mammograms. Talk with your health care provider about your test results, treatment options, and if necessary, the need for more tests. Vaccines  Your health care provider may recommend certain vaccines, such as: Influenza vaccine. This is recommended every year. Tetanus, diphtheria, and acellular pertussis (Tdap, Td) vaccine. You may need a Td booster every 10 years. Zoster vaccine. You may need this after age 97. Pneumococcal 13-valent conjugate (PCV13) vaccine. One dose is recommended after age 49. Pneumococcal polysaccharide (PPSV23) vaccine. One dose is recommended after age 25. Talk to your health care provider about which screenings and vaccines you need and how often you need them. This information is not intended to replace advice given to you by your health care provider. Make sure you discuss any questions you have with your health care provider. Document Released: 09/16/2015 Document Revised: 05/09/2016 Document Reviewed: 06/21/2015 Elsevier Interactive Patient Education  2017 [place] Prevention in the Home Falls can cause injuries. They can happen to people of all ages. There are many things you can do to make your home safe and to help prevent falls. What can I do on the outside of my home? Regularly fix the edges of walkways and driveways and fix any cracks. Remove anything that might make you trip as you walk through a door, such as a raised step or threshold. Trim any bushes or trees on the path to your home. Use bright outdoor lighting. Clear any walking paths of anything that might make someone trip, such as rocks or tools. Regularly check to see if handrails are loose or broken. Make sure that both sides of any steps have handrails. Any raised decks and porches should have guardrails on the edges. Have any leaves, snow, or ice cleared regularly. Use sand or salt on walking paths during winter. Clean up any spills in  your garage right away. This includes oil or grease spills. What can I do in the bathroom? Use night lights. Install grab bars by the toilet and in the tub and shower. Do not use towel bars as grab bars. Use non-skid mats or decals in the tub or shower. If you need to sit down in the shower, use a plastic, non-slip stool. Keep the floor dry. Clean up any water that spills on the floor as soon as it happens. Remove soap buildup in the tub or shower regularly. Attach bath mats securely with double-sided non-slip rug tape. Do not have throw rugs and other things on the floor that can make you trip. What can I do in the bedroom? Use night lights. Make sure that you have a light by your bed that is easy to reach. Do not use any sheets or blankets that are too big for your bed. They should not hang down onto the floor. Have a firm chair that has side arms. You can use this for support while you get dressed. Do not have throw rugs and other things on the floor that can make you trip. What can I do in the kitchen? Clean up any spills right away. Avoid walking on wet floors. Keep items that you use a lot in easy-to-reach places. If you need to reach something above you, use a strong step stool that has a grab bar. Keep electrical cords out of the way. Do not use floor polish or wax that makes floors slippery. If you must use wax, use  non-skid floor wax. Do not have throw rugs and other things on the floor that can make you trip. What can I do with my stairs? Do not leave any items on the stairs. Make sure that there are handrails on both sides of the stairs and use them. Fix handrails that are broken or loose. Make sure that handrails are as long as the stairways. Check any carpeting to make sure that it is firmly attached to the stairs. Fix any carpet that is loose or worn. Avoid having throw rugs at the top or bottom of the stairs. If you do have throw rugs, attach them to the floor with carpet  tape. Make sure that you have a light switch at the top of the stairs and the bottom of the stairs. If you do not have them, ask someone to add them for you. What else can I do to help prevent falls? Wear shoes that: Do not have high heels. Have rubber bottoms. Are comfortable and fit you well. Are closed at the toe. Do not wear sandals. If you use a stepladder: Make sure that it is fully opened. Do not climb a closed stepladder. Make sure that both sides of the stepladder are locked into place. Ask someone to hold it for you, if possible. Clearly mark and make sure that you can see: Any grab bars or handrails. First and last steps. Where the edge of each step is. Use tools that help you move around (mobility aids) if they are needed. These include: Canes. Walkers. Scooters. Crutches. Turn on the lights when you go into a dark area. Replace any light bulbs as soon as they burn out. Set up your furniture so you have a clear path. Avoid moving your furniture around. If any of your floors are uneven, fix them. If there are any pets around you, be aware of where they are. Review your medicines with your doctor. Some medicines can make you feel dizzy. This can increase your chance of falling. Ask your doctor what other things that you can do to help prevent falls. This information is not intended to replace advice given to you by your health care provider. Make sure you discuss any questions you have with your health care provider. Document Released: 06/16/2009 Document Revised: 01/26/2016 Document Reviewed: 09/24/2014 Elsevier Interactive Patient Education  2017 [employer].

## 2021-11-07 ENCOUNTER — Encounter: Payer: Self-pay | Admitting: Internal Medicine

## 2021-11-07 ENCOUNTER — Ambulatory Visit (INDEPENDENT_AMBULATORY_CARE_PROVIDER_SITE_OTHER): Payer: PPO | Admitting: Internal Medicine

## 2021-11-07 ENCOUNTER — Other Ambulatory Visit: Payer: Self-pay

## 2021-11-07 DIAGNOSIS — I10 Essential (primary) hypertension: Secondary | ICD-10-CM

## 2021-11-07 MED ORDER — AMLODIPINE BESYLATE 5 MG PO TABS: 5.0000 mg | ORAL_TABLET | Freq: Every day | ORAL | 3 refills | Status: DC

## 2021-11-07 NOTE — Progress Notes (Signed)
? ?  Subjective:  ? ?Patient ID: [name] [name], female    DOB: Jul 23, 1941, 81 y.o.   MRN: [id number] ? ?HPI ?The patient is an 81 YO female coming in for leg swelling. ? ?Review of Systems  ?Constitutional: Negative.   ?HENT: Negative.    ?Eyes: Negative.   ?Respiratory:  Negative for cough, chest tightness and shortness of breath.   ?Cardiovascular:  Positive for leg swelling. Negative for chest pain and palpitations.  ?Gastrointestinal:  Negative for abdominal distention, abdominal pain, constipation, diarrhea, nausea and vomiting.  ?Musculoskeletal: Negative.   ?Skin: Negative.   ?Neurological: Negative.   ?Psychiatric/Behavioral: Negative.    ? ?Objective:  ?Physical Exam ?Constitutional:   ?   Appearance: She is well-developed.  ?HENT:  ?   Head: Normocephalic and atraumatic.  ?Cardiovascular:  ?   Rate and Rhythm: Normal rate and regular rhythm.  ?Pulmonary:  ?   Effort: Pulmonary effort is normal. No respiratory distress.  ?   Breath sounds: Normal breath sounds. No wheezing or rales.  ?Abdominal:  ?   General: Bowel sounds are normal. There is no distension.  ?   Palpations: Abdomen is soft.  ?   Tenderness: There is no abdominal tenderness. There is no rebound.  ?Musculoskeletal:  ?   Cervical back: Normal range of motion.  ?   Right lower leg: Edema present.  ?   Left lower leg: Edema present.  ?   Comments: Minimal RLE edema, 1+ LLE edema. Non-pitting and no pain, no calf tenderness or swelling  ?Skin: ?   General: Skin is warm and dry.  ?Neurological:  ?   Mental Status: She is alert and oriented to person, place, and time.  ?   Coordination: Coordination normal.  ? ? ?Vitals:  ? 11/07/21 1426  ?BP: 130/72  ?Pulse: 79  ?Resp: 18  ?SpO2: 94%  ?Weight: 126 lb 6.4 oz (57.3 kg)  ?Height: '4\' 11"'$  (1.499 m)  ? ? ?This visit occurred during the SARS-CoV-2 public health emergency.  Safety protocols were in place, including screening questions prior to the visit, additional usage of staff PPE, and extensive  cleaning of exam room while observing appropriate contact time as indicated for disinfecting solutions.  ? ?Assessment & Plan:  ? ?

## 2021-11-07 NOTE — Assessment & Plan Note (Signed)
Suspect amlodipine 10 mg daily could be causing swelling. Will reduce to 5 mg daily amlodipine and new rx done. Will continue losartan 50 mg daily. Follow up BP 2-4 weeks and if elevated will increase losartan to 100 mg daily. If no improvement in swelling can consider d/c amlodipine.  ?

## 2021-11-07 NOTE — Patient Instructions (Addendum)
We will have you cut the amlodipine pill in half and take 5 mg daily in stead of 10 mg daily. We have sent in the new prescription. ? ?Come back in 2-4 weeks for a blood pressure check. ?

## 2021-11-17 ENCOUNTER — Other Ambulatory Visit: Payer: Self-pay | Admitting: Internal Medicine

## 2021-11-17 DIAGNOSIS — F418 Other specified anxiety disorders: Secondary | ICD-10-CM

## 2021-11-17 DIAGNOSIS — K2100 Gastro-esophageal reflux disease with esophagitis, without bleeding: Secondary | ICD-10-CM

## 2021-11-17 DIAGNOSIS — E785 Hyperlipidemia, unspecified: Secondary | ICD-10-CM

## 2021-11-17 DIAGNOSIS — I2510 Atherosclerotic heart disease of native coronary artery without angina pectoris: Secondary | ICD-10-CM

## 2021-11-27 ENCOUNTER — Telehealth: Payer: Self-pay

## 2021-11-27 NOTE — Progress Notes (Signed)
? ? ?  Chronic Care Management ?Pharmacy Assistant  ? ?Name: [name] [name]  MRN: [id number] DOB: 02/23/1941 ? ?Received call from patient regarding medication management via [facility]. ? ?Patient requested an acute fill for Losartan 50 mg, Celebrex 50 mg, nitroglycerin 0.4 mg  to be delivered: 11/30/21 ?Pharmacy needs refills? No ? ?Confirmed delivery date of 11/30/21, advised patient that pharmacy will contact them the morning of delivery. ? ?Star Rating Drugs: ?Atorvastatin 50 mg-last fill 11/17/21 ?Losartan 50 mg-last fill 10/26/21 ? ?Ethelene Hal ?Clinical Pharmacist Assistant ?[phone]  ?

## 2021-11-29 ENCOUNTER — Other Ambulatory Visit: Payer: Self-pay | Admitting: Internal Medicine

## 2021-11-29 DIAGNOSIS — M159 Polyosteoarthritis, unspecified: Secondary | ICD-10-CM

## 2021-11-29 DIAGNOSIS — I25119 Atherosclerotic heart disease of native coronary artery with unspecified angina pectoris: Secondary | ICD-10-CM

## 2021-12-20 ENCOUNTER — Telehealth: Payer: Self-pay | Admitting: Physician Assistant

## 2021-12-20 NOTE — Telephone Encounter (Signed)
Spoke with the patient who states that she has been having some lightheadedness since last week. She notices when she is up and moving around as well as when she changes positions slowly. She also reports feeling like her heart is racing at times. She does not have a way to check her HR. She denies any chest pain but reports that she does have trouble catching her breath when her hear races. She states that these symptoms occur more frequently in the afternoon. She reports that she is staying hydrated. She does not currently feel like she is going to pass out. Advised patient to rest and change positions slowly. She is scheduled to see [name] Montana, NP tomorrow. She will let us know if symptoms worsen in the meantime.  ?

## 2021-12-20 NOTE — Telephone Encounter (Signed)
STAT if patient feels like he/she is going to faint  ? ?Are you dizzy now? Not at this- feel lightheaded ? ?Do you feel faint or have you passed out? Feel like she is going to pass out, but have not passed out ? ?Do you have any other symptoms? Pain at the top her stomach- have it a little at this time- but it comes and goes ? ?Have you checked your HR and BP (record if available)? 129/75 - patient wanted to be seen- made her an appointment for tomorrow (12-21-21) with Urban Gibson- please call to evaluate ? ? ? ?

## 2021-12-21 ENCOUNTER — Encounter (HOSPITAL_BASED_OUTPATIENT_CLINIC_OR_DEPARTMENT_OTHER): Payer: Self-pay | Admitting: Family

## 2021-12-21 ENCOUNTER — Ambulatory Visit (HOSPITAL_BASED_OUTPATIENT_CLINIC_OR_DEPARTMENT_OTHER): Payer: PPO | Admitting: Family

## 2021-12-21 ENCOUNTER — Ambulatory Visit (INDEPENDENT_AMBULATORY_CARE_PROVIDER_SITE_OTHER): Payer: PPO

## 2021-12-21 VITALS — BP 144/78 | HR 78 | Ht 59.0 in | Wt 125.6 lb

## 2021-12-21 DIAGNOSIS — E785 Hyperlipidemia, unspecified: Secondary | ICD-10-CM

## 2021-12-21 DIAGNOSIS — R42 Dizziness and giddiness: Secondary | ICD-10-CM

## 2021-12-21 DIAGNOSIS — R55 Syncope and collapse: Secondary | ICD-10-CM | POA: Diagnosis not present

## 2021-12-21 DIAGNOSIS — R002 Palpitations: Secondary | ICD-10-CM

## 2021-12-21 DIAGNOSIS — I10 Essential (primary) hypertension: Secondary | ICD-10-CM | POA: Diagnosis not present

## 2021-12-21 DIAGNOSIS — I25118 Atherosclerotic heart disease of native coronary artery with other forms of angina pectoris: Secondary | ICD-10-CM | POA: Diagnosis not present

## 2021-12-21 NOTE — Patient Instructions (Signed)
Medication Instructions:  ?Continue your current medications.  ? ?*If you need a refill on your cardiac medications before your next appointment, please call your pharmacy* ? ? ?Lab Work: ?Your physician recommends that you return for lab work today: TSH, CBC ? ?If you have labs (blood work) drawn today and your tests are completely normal, you will receive your results only by: ?MyChart Message (if you have MyChart) OR ?A paper copy in the mail ?If you have any lab test that is abnormal or we need to change your treatment, we will call you to review the results. ? ? ?Testing/Procedures: ? ?Your physician has recommended that you wear a Zio monitor.  ? ?This monitor is a medical device that records the heart?s electrical activity. Doctors most often use these monitors to diagnose arrhythmias. Arrhythmias are problems with the speed or rhythm of the heartbeat. The monitor is a small device applied to your chest. You can wear one while you do your normal daily activities. While wearing this monitor if you have any symptoms to push the button and record what you felt. Once you have worn this monitor for the period of time provider prescribed (Usually 14 days), you will return the monitor device in the postage paid box. Once it is returned they will download the data collected and provide [place] with a report which the provider will then review and we will call you with those results. Important tips: ? ?Avoid showering during the first 24 hours of wearing the monitor. ?Avoid excessive sweating to help maximize wear time. ?Do not submerge the device, no hot tubs, and no swimming pools. ?Keep any lotions or oils away from the patch. ?After 24 hours you may shower with the patch on. Take brief showers with your back facing the shower head.  ?Do not remove patch once it has been placed because that will interrupt data and decrease adhesive wear time. ?Push the button when you have any symptoms and write down what you were  feeling. ?Once you have completed wearing your monitor, remove and place into box which has postage paid and place in your outgoing mailbox.  ?If for some reason you have misplaced your box then call our office and we can provide another box and/or mail it off for you. ? ?______________________________________ ? ?Your physician has requested that you have an echocardiogram. Echocardiography is a painless test that uses sound waves to create images of your heart. It provides your doctor with information about the size and shape of your heart and how well your heart?s chambers and valves are working. This procedure takes approximately one hour. There are no restrictions for this procedure.  ? ?______________________________________ ? ?Your physician has requested that you have a carotid duplex. This test is an ultrasound of the carotid arteries in your neck. It looks at blood flow through these arteries that supply the brain with blood. Allow one hour for this exam. There are no restrictions or special instructions.  ? ? ?Follow-Up: ?At [facility], you and your health needs are our priority.  As part of our continuing mission to provide you with exceptional heart care, we have created designated Provider Care Teams.  These Care Teams include your primary Cardiologist (physician) and Advanced Practice Providers (APPs -  Physician Assistants and Nurse Practitioners) who all work together to provide you with the care you need, when you need it. ? ?We recommend signing up for the patient portal called "MyChart".  Sign up information is provided on  this After Visit Summary.  MyChart is used to connect with patients for Virtual Visits (Telemedicine).  Patients are able to view lab/test results, encounter notes, upcoming appointments, etc.  Non-urgent messages can be sent to your provider as well.   ?To learn more about what you can do with MyChart, go to [email or web address].   ? ?Your next appointment:   ?2-3  month(s) ? ?The format for your next appointment:   ?In Person ? ?Provider:   ?Sherren Mocha, MD or Richardson Dopp, [place] or Loel Dubonnet, NP   ? ? ?Other Instructions ? ?To prevent palpitations: ?Make sure you are adequately hydrated.  ?Avoid and/or limit caffeine containing beverages like soda or tea. ?Exercise regularly.  ?Manage stress well. ?Some over the counter medications can cause palpitations such as Benadryl, AdvilPM, TylenolPM. Regular Advil or Tylenol do not cause palpitations.   ? ?Orthostatic Hypotension ?Blood pressure is a measurement of how strongly, or weakly, your circulating blood is pressing against the walls of your arteries. Orthostatic hypotension is a drop in blood pressure that can happen when you change positions, such as when you go from lying down to standing. ?Arteries are blood vessels that carry blood from your heart throughout your body. When blood pressure is too low, you may not get enough blood to your brain or to the rest of your organs. Orthostatic hypotension can cause light-headedness, sweating, rapid heartbeat, blurred vision, and fainting. These symptoms require further investigation into the cause. ?What are the causes? ?Orthostatic hypotension can be caused by many things, including: ?Sudden changes in posture, such as standing up quickly after you have been sitting or lying down. ?Loss of blood (anemia) or loss of body fluids (dehydration). ?Heart problems, neurologic problems, or hormone problems. ?Pregnancy. ?Aging. The risk for this condition increases as you get older. ?Severe infection (sepsis). ?Certain medicines, such as medicines for high blood pressure or medicines that make the body lose excess fluids (diuretics). ?What are the signs or symptoms? ?Symptoms of this condition may include: ?Weakness, light-headedness, or dizziness. ?Sweating. ?Blurred vision. ?Tiredness (fatigue). ?Rapid heartbeat. ?Fainting, in severe cases. ?How is this diagnosed? ?This  condition is diagnosed based on: ?Your symptoms and medical history. ?Your blood pressure measurements. Your health care provider will check your blood pressure when you are: ?Lying down. ?Sitting. ?Standing. ?A blood pressure reading is recorded as two numbers, such as "120 over 80" (or 120/80). The first ("top") number is called the systolic pressure. It is a measure of the pressure in your arteries as your heart beats. The second ("bottom") number is called the diastolic pressure. It is a measure of the pressure in your arteries when your heart relaxes between beats. Blood pressure is measured in a unit called mmHg. Healthy blood pressure for most adults is 120/80 mmHg. Orthostatic hypotension is defined as a 20 mmHg drop in systolic pressure or a 10 mmHg drop in diastolic pressure within 3 minutes of standing. ?Other information or tests that may be used to diagnose orthostatic hypotension include: ?Your other vital signs, such as your heart rate and temperature. ?Blood tests. ?An electrocardiogram (ECG) or echocardiogram. ?A Holter monitor. This is a device you wear that records your heart rhythm continuously, usually for 24-48 hours. ?Tilt table test. For this test, you will be safely secured to a table that moves you from a lying position to an upright position. Your heart rhythm and blood pressure will be monitored during the test. ?How is this treated? ?This condition may be treated  by: ?Changing your diet. This may involve eating more salt (sodium) or drinking more water. ?Changing the dosage of certain medicines you are taking that might be lowering your blood pressure. ?Correcting the underlying reason for the orthostatic hypotension. ?Wearing compression stockings. ?Taking medicines to raise your blood pressure. ?Avoiding actions that trigger symptoms. ?Follow these instructions at home: ?Medicines ?Take over-the-counter and prescription medicines only as told by your health care provider. ?Follow  instructions from your health care provider about changing the dosage of your current medicines, if this applies. ?Do not stop or adjust any of your medicines on your own. ?Eating and drinking ? ?Drink enough fluid to keep y

## 2021-12-21 NOTE — Progress Notes (Signed)
? ?Office Visit  ?  ?Patient Name: [name] [name] ?Date of Encounter: 12/22/2021 ? ?PCP:  Janith Lima, MD ?  ?[place]  ?Cardiologist:  Sherren Mocha, MD  ?Advanced Practice Provider:  Liliane Shi, PA-C ?Electrophysiologist:  None  ? ?Chief Complaint  ?  ?[name] [name] is a 81 y.o. female with a hx of CAD (inferior MI 2007 DES to RCA, 07/2017 POBA to [facility] and DES to dRCA, 03/2020 DES to RCA), bacterial endocarditis (2018), HTN, HLD, DM2 presents today for shortness of breath, lightheadedness  ? ?Past Medical History  ?  ?Past Medical History:  ?Diagnosis Date  ? Allergy   ? Anal fissure   ? Anxiety   ? CAD (coronary artery disease)   ? s/p inf MI 2007 - tx w/ DES to RCA // s/p POBA to [facility] and DES to [place] in 11/18 ([facility] in [place], [place]) // Myoview 3/21: low risk // s/p DES to pRCA  ? Cataract   ? removed both eyes  ? Cerebrovascular disease   ? Carotid US 09/2017 [facility]): mild plaque (<50%) in both carotid arteries  ? Chronic neck pain   ? Chronic pain syndrome   ? Diabetes Mellitus, Type 2   ? Diverticular disease   ? DJD (degenerative joint disease)   ? Echocardiogram 10/2018   ? Echo 10/2018: EF 55-60, normal RVSF, mod MAC, mod TR, severe AoV calcification and sclerosis with nodular calcium/mobile area of calcium in the LVOT (small veg vs Lambl's excrescence  - consider TEE), mild AI, mild AS (mean 11).   ? Echocardiogram 04/2019   ? Echocardiogram 04/2019: EF 60-65, basal septal hypertrophy, grade 2 diastolic dysfunction, normal wall motion, normal RV SF, mild LAE, mod MAC, trivial MR, mod sclerosis of the aortic valve with mod aortic annular calcification, thin mobile filamentous structure on ventricular side of AV likely representing Lambl's excrescence, mild AI, mild TR  ? Frequent UTI's   ? Gastroesophageal reflux disease   ? H/O bacterial endocarditis   ? Rx w IV Abxs in 2020 ([facility] in [place], [place]) // F/u echo with AoV Lambl's excrescence  ? H/O hiatal hernia    ? HTN (hypertension)   ? Hx of fall 10/2020  ? Hx of MI 2007  ? Hx of Stroke   ? IBS (irritable bowel syndrome)   ? 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 Internal hemorrhoids   ? Ischemic colitis ([place])   ? Mitral valve prolapse   ? Mixed hyperlipidemia   ? Neuromuscular disorder ([place])   ? hiatal hernia  ? Nocturia   ? Pancreatitis   ? 1955 an once more  ? postoperative nausea and vomiting   ? Difficluty opening mouth wide and turning head. (Cervical Fusion)  ? Premature ventricular contractions   ? Tubular adenoma of colon   ? Ulcer   ? sam [place] gi  ? Urinary incontinence   ? ?Past Surgical History:  ?Procedure Laterality Date  ? APPENDECTOMY  1955  ? BLADDER REPAIR  2007  ? CARDIAC CATHETERIZATION  2007  ? Stents  ? CERVICAL SPINE SURGERY  07/2005  ? Dr Consuello Masse  ? CHOLECYSTECTOMY  2007  ? COLONOSCOPY    ? CORONARY PRESSURE WIRE/FFR WITH 3D MAPPING N/A 06/28/2021  ? Procedure: Coronary Pressure Wire/FFR w/3D Mapping;  Surgeon: Burnell Blanks, MD;  Location: [place] [facility];  Service: Cardiovascular;  Laterality: N/A;  ? CORONARY STENT INTERVENTION N/A 03/09/2020  ?  Procedure: CORONARY STENT INTERVENTION;  Surgeon: Burnell Blanks, MD;  Location: [place] [facility];  Service: Cardiovascular;  Laterality: N/A;  ? CORONARY STENT INTERVENTION N/A 06/28/2021  ? Procedure: CORONARY STENT INTERVENTION;  Surgeon: Burnell Blanks, MD;  Location: [place] [facility];  Service: Cardiovascular;  Laterality: N/A;  ? DENTAL SURGERY  05/2013  ? replaced an inplant  ? EYE SURGERY  2011  ? Bilteral  ? I & D KNEE WITH POLY EXCHANGE  09/25/2011  ? Procedure: IRRIGATION AND DEBRIDEMENT KNEE WITH POLY EXCHANGE;  Surgeon: Johnny Bridge, MD;  Location: [place];  Service: Orthopedics;  Laterality: Left;  ? INCISION AND DRAINAGE ABSCESS / HEMATOMA OF BURSA / KNEE / THIGH  09/2011  ? JOINT REPLACEMENT  2012  ? left  ? LEFT HEART CATH AND CORONARY ANGIOGRAPHY N/A 03/09/2020  ? Procedure: LEFT HEART CATH AND  CORONARY ANGIOGRAPHY;  Surgeon: Burnell Blanks, MD;  Location: [place] [facility];  Service: Cardiovascular;  Laterality: N/A;  ? LEFT HEART CATH AND CORONARY ANGIOGRAPHY N/A 06/28/2021  ? Procedure: LEFT HEART CATH AND CORONARY ANGIOGRAPHY;  Surgeon: Burnell Blanks, MD;  Location: [place] [facility];  Service: Cardiovascular;  Laterality: N/A;  ? left Total Knee Replacement  11/2010  ? Dr Percell Miller  ? NECK SURGERY    ? has had 3 surgeries  ? POLYPECTOMY    ? POSTERIOR CERVICAL FUSION/FORAMINOTOMY  04/08/2012  ? Procedure: POSTERIOR CERVICAL FUSION/FORAMINOTOMY LEVEL 2;  Surgeon: Otilio Connors, MD;  Location: [place] [facility];  Service: Neurosurgery;  Laterality: Left;  Left Cervical six-seven Foraminotomy, bilateral cervical seven-thoracic one foraminotomy, cervical six-seven, cervical seven-thoracic one fusion with posterior instrumentation  ? TEMPOROMANDIBULAR JOINT SURGERY    ? thumb surgery    ? TONSILLECTOMY    ? TONSILLECTOMY  1950  ? TOTAL ABDOMINAL HYSTERECTOMY    ? ? ?Allergies ? ?Allergies  ?Allergen Reactions  ? Nitrofurantoin Nausea Only  ?  Severe nausea  ? Niacin And Related Other (See Comments)  ?  Must take "Flush-free"  ? Ceftriaxone Itching  ? Codeine Nausea Only  ? Prednisone Itching and Rash  ? Sulfonamide Derivatives Itching  ? Tetracycline Itching and Rash  ? ? ?History of Present Illness  ?  ?[name] [name] is a 81 y.o. female with a hx of CAD (inferior MI 2007 DES to RCA, 07/2017 POBA to [facility] and DES to dRCA, 03/2020 DES to RCA, 2022 LAD to DES), bacterial endocarditis (2018), HTN, HLD, DM2 last seen 07/24/21. ? ?She was seen 06/2021 for recurrent angina and set up for LHC demonstrating severe prox LAD stenosis and underwent PCI with DES to pLAD. Patent stent noted to RCA, mild tomoderate stenosis in non dominant LCx. Recommended for DAPT x 6 months. When last seen 07/2021 her Imdur was discontinued due to headache. Amlodipine was increased to 70m QD. Since that time her PCP  reduced her Amlodipine to 575mdue to LE edema. ? ?She presents today for follow up with her daughter-in-law endorsing lightheadedness with onset one week ago. Feels as if she needs to sit down and near syncopal. No true syncope. Notes episodes of feeling her heart racing. Notes shortness of breath at rest and with activity which is new. Reports no chest pain, pressure, or tightness. No edema, orthopnea, PND. No hematuria, melena. Notes she does lose her balance quite often. Her BP at home over the past couple days 129/74, 135/65 ? ?EKGs/Labs/Other Studies Reviewed:  ? ?The following studies  were reviewed today: ? ?LHC 06/2021 ?  Mid LAD lesion is 30% stenosed. ?  Prox Cx to Mid Cx lesion is 30% stenosed. ?  Mid RCA lesion is 10% stenosed. ?  Prox LAD to Mid LAD lesion is 70% stenosed. ?  A drug-eluting stent was successfully placed using a STENT ONYX FRONTIER 3.0X18. ?  Post intervention, there is a 0% residual stenosis. ?  Patent mid RCA stents ?  ?Severe proximal LAD stenosis that angiographically appears moderate to severe. RFR assessment with pressure wire of 0.88.  ?Successful PTCA/DES x 1 proximal LAD (RFR guided) ?Mild to moderate stenosis in the non-dominant mid Circumflex ?Large dominant RCA with patent mid stents without restenosis ?Normal LVEDP ?  ?Recommendations: Continue DAPT with ASA/Plavix. Continue beta blocker and statin.  ? ?EKG:  EKG is  ordered today.  The ekg ordered today demonstrates SR 78 bpm with PAC and no acute ST/T wave changes. ? ?Recent Labs: ?05/02/2021: ALT 24 ?10/23/2021: BUN 13; Creatinine, Ser 0.78; Potassium 4.6; Sodium 141 ?12/21/2021: Hemoglobin 14.3; Platelets 239; TSH 3.300  ?Recent Lipid Panel ?   ?Component Value Date/Time  ? CHOL 109 10/04/2021 0921  ? TRIG 110.0 10/04/2021 0921  ? TRIG 164 (H) 06/24/2006 0738  ? HDL 48.00 10/04/2021 0921  ? CHOLHDL 2 10/04/2021 9892  ? VLDL 22.0 10/04/2021 0921  ? LDLCALC 39 10/04/2021 0921  ? LDLDIRECT 42.0 09/12/2020 1516  ? ? ? ?Home  Medications  ? ?Current Meds  ?Medication Sig  ? amLODipine (NORVASC) 5 MG tablet Take 1 tablet (5 mg total) by mouth daily.  ? Ascorbic Acid (VITAMIN C) 500 MG CAPS Take 500 mg by mouth in the morning and at

## 2021-12-22 ENCOUNTER — Encounter (HOSPITAL_BASED_OUTPATIENT_CLINIC_OR_DEPARTMENT_OTHER): Payer: Self-pay | Admitting: Family

## 2021-12-22 LAB — CBC
Hematocrit: 42.7 % (ref 34.0–46.6)
Hemoglobin: 14.3 g/dL (ref 11.1–15.9)
MCH: 31.6 pg (ref 26.6–33.0)
MCHC: 33.5 g/dL (ref 31.5–35.7)
MCV: 94 fL (ref 79–97)
Platelets: 239 10*3/uL (ref 150–450)
RBC: 4.53 x10E6/uL (ref 3.77–5.28)
RDW: 12.3 % (ref 11.7–15.4)
WBC: 7.2 10*3/uL (ref 3.4–10.8)

## 2021-12-22 LAB — TSH: TSH: 3.3 u[IU]/mL (ref 0.450–4.500)

## 2022-01-05 DIAGNOSIS — R002 Palpitations: Secondary | ICD-10-CM | POA: Diagnosis not present

## 2022-01-15 ENCOUNTER — Telehealth (HOSPITAL_BASED_OUTPATIENT_CLINIC_OR_DEPARTMENT_OTHER): Payer: Self-pay

## 2022-01-15 ENCOUNTER — Ambulatory Visit (INDEPENDENT_AMBULATORY_CARE_PROVIDER_SITE_OTHER): Payer: PPO

## 2022-01-15 DIAGNOSIS — R55 Syncope and collapse: Secondary | ICD-10-CM | POA: Diagnosis not present

## 2022-01-15 DIAGNOSIS — I10 Essential (primary) hypertension: Secondary | ICD-10-CM

## 2022-01-15 DIAGNOSIS — R42 Dizziness and giddiness: Secondary | ICD-10-CM

## 2022-01-15 DIAGNOSIS — R002 Palpitations: Secondary | ICD-10-CM

## 2022-01-15 LAB — ECHOCARDIOGRAM COMPLETE
AR max vel: 2.22 cm2
AV Area VTI: 2.27 cm2
AV Area mean vel: 2.32 cm2
AV Mean grad: 10 mmHg
AV Peak grad: 18.2 mmHg
Ao pk vel: 2.14 m/s
Area-P 1/2: 4.44 cm2
P 1/2 time: 445 msec
S' Lateral: 2.6 cm

## 2022-01-15 MED ORDER — METOPROLOL SUCCINATE ER 50 MG PO TB24: 75.0000 mg | ORAL_TABLET | Freq: Every day | ORAL | 3 refills | Status: DC

## 2022-01-15 NOTE — Telephone Encounter (Addendum)
Results called to patient who verbalizes understanding! Labs ordered and mailed, prescriptions updated and sent to pharmacy on file, recommend patient do a trial of Metoprolol in the morning vs. The evening to see if it makes a difference.  ? ? ?----- Message from Charlie Pitter, PA-C sent at 01/15/2022  5:11 PM EDT ----- ?Covering Caitlin's inbasket this week. Please let patient know echo overall showed normal squeeze function. There were some findings on this study including mild stiffness of the heart muscle, mildly elevated pressure in the lungs, mild leaking mitral and aortic valve. Overall nothing alarming. In addition to good blood pressure control, would advise to generally stick to lower sodium diet and staying hydrated but not to excess. In general about 64oz per day of all fluid intake would be an ideal maximum. Can be discussed in more detail at follow-up. See also carotid results. ? ?Event monitor also reviewed recently, most notable for 74 episodes of SVT, occasional PACs, PVCs, brief NSVT as well. Please find out if palpitations/near syncope have resolved. If resolved, keep f/u as planned in June. ? ?If symptoms have not resolved, recommendations: ?- check BMET, Mg, BNP (had recent CBC, TSH that were reassuring) ?- increase metoprolol to 1.5 tablets daily. If symptoms predominantly occur during daytime hours, switch dosing from PM to AM ?- keep f/u as planned ? ? ? ?Please let patient know carotid study was unrevealing for any major blockage. Only mild plaque buildup. Would not be causing symptoms. Mainstay of therapy is making sure cholesterol well controlled which it was in Feb 2023. See also echo report as well.  ? ? ? ?

## 2022-01-16 MED ORDER — METOPROLOL SUCCINATE ER 50 MG PO TB24: 75.0000 mg | ORAL_TABLET | Freq: Every day | ORAL | 3 refills | Status: DC

## 2022-01-16 NOTE — Addendum Note (Signed)
Addended by: Gerald Stabs on: 01/16/2022 07:46 AM ? ? Modules accepted: Orders ? ?

## 2022-01-24 DIAGNOSIS — R002 Palpitations: Secondary | ICD-10-CM | POA: Diagnosis not present

## 2022-01-25 LAB — BASIC METABOLIC PANEL
BUN/Creatinine Ratio: 21 (ref 12–28)
BUN: 26 mg/dL (ref 8–27)
CO2: 27 mmol/L (ref 20–29)
Calcium: 8.9 mg/dL (ref 8.7–10.3)
Chloride: 99 mmol/L (ref 96–106)
Creatinine, Ser: 1.23 mg/dL — ABNORMAL HIGH (ref 0.57–1.00)
Glucose: 157 mg/dL — ABNORMAL HIGH (ref 70–99)
Potassium: 5 mmol/L (ref 3.5–5.2)
Sodium: 142 mmol/L (ref 134–144)
eGFR: 44 mL/min/{1.73_m2} — ABNORMAL LOW (ref >59)

## 2022-01-25 LAB — BRAIN NATRIURETIC PEPTIDE: BNP: 55.3 pg/mL (ref 0.0–100.0)

## 2022-01-25 LAB — MAGNESIUM: Magnesium: 2.1 mg/dL (ref 1.6–2.3)

## 2022-01-26 ENCOUNTER — Telehealth (HOSPITAL_BASED_OUTPATIENT_CLINIC_OR_DEPARTMENT_OTHER): Payer: Self-pay

## 2022-01-26 DIAGNOSIS — I10 Essential (primary) hypertension: Secondary | ICD-10-CM

## 2022-01-26 NOTE — Telephone Encounter (Addendum)
Call attempt, no answer, unable to leave message will send in mychart as patient is active.   Lab slips mailed to patient   ----- Message from Loel Dubonnet, NP sent at 01/26/2022  7:33 AM EDT ----- Kidney function decreased from previous. Normal electrolytes. BNP with no significant volume overload.   Recommend increasing fluid intake. Hold Losartan for 3 days and resume at '25mg'$  daily. Repeat BMP in 1-2 weeks. Follow up as scheduled.

## 2022-01-31 ENCOUNTER — Other Ambulatory Visit: Payer: Self-pay | Admitting: Internal Medicine

## 2022-01-31 DIAGNOSIS — K2100 Gastro-esophageal reflux disease with esophagitis, without bleeding: Secondary | ICD-10-CM

## 2022-02-05 ENCOUNTER — Other Ambulatory Visit: Payer: Self-pay | Admitting: Cardiovascular Disease

## 2022-02-05 DIAGNOSIS — I10 Essential (primary) hypertension: Secondary | ICD-10-CM

## 2022-02-06 NOTE — Telephone Encounter (Signed)
Pt of Dr. Burt Knack. Please review for refill. Thank you!

## 2022-02-09 DIAGNOSIS — I10 Essential (primary) hypertension: Secondary | ICD-10-CM | POA: Diagnosis not present

## 2022-02-10 LAB — BASIC METABOLIC PANEL
BUN/Creatinine Ratio: 20 (ref 12–28)
BUN: 21 mg/dL (ref 8–27)
CO2: 29 mmol/L (ref 20–29)
Calcium: 8.8 mg/dL (ref 8.7–10.3)
Chloride: 99 mmol/L (ref 96–106)
Creatinine, Ser: 1.05 mg/dL — ABNORMAL HIGH (ref 0.57–1.00)
Glucose: 151 mg/dL — ABNORMAL HIGH (ref 70–99)
Potassium: 5.4 mmol/L — ABNORMAL HIGH (ref 3.5–5.2)
Sodium: 139 mmol/L (ref 134–144)
eGFR: 53 mL/min/{1.73_m2} — ABNORMAL LOW (ref >59)

## 2022-02-12 ENCOUNTER — Telehealth (HOSPITAL_BASED_OUTPATIENT_CLINIC_OR_DEPARTMENT_OTHER): Payer: Self-pay

## 2022-02-12 DIAGNOSIS — I10 Essential (primary) hypertension: Secondary | ICD-10-CM

## 2022-02-12 MED ORDER — LOSARTAN POTASSIUM 25 MG PO TABS: 25.0000 mg | ORAL_TABLET | Freq: Every day | ORAL | 3 refills | Status: DC

## 2022-02-12 NOTE — Telephone Encounter (Addendum)
Seen by patient [name] [name] on 02/11/2022  7:27 PM  Labs ordered and mailed, prescriptions updated and sent to pharmacy on file      ----- Message from Loel Dubonnet, NP sent at 02/10/2022  6:29 PM EDT ----- Kidney function improved. Potassium mildly elevated. Ensure not taking potassium supplement, avoid salt substitute, avoid electrolyte drinks. Continue Losartan '25mg'$  daily. Repeat BMP one day Mon-Wed for monitoring of potassium.  [name] - note she may need updated Rx for Losartan '25mg'$  tabs or may prefer to continue half tablet of '50mg'$ 

## 2022-02-13 LAB — BASIC METABOLIC PANEL
BUN/Creatinine Ratio: 17 (ref 12–28)
BUN: 17 mg/dL (ref 8–27)
CO2: 25 mmol/L (ref 20–29)
Calcium: 9.4 mg/dL (ref 8.7–10.3)
Chloride: 95 mmol/L — ABNORMAL LOW (ref 96–106)
Creatinine, Ser: 1.03 mg/dL — ABNORMAL HIGH (ref 0.57–1.00)
Glucose: 138 mg/dL — ABNORMAL HIGH (ref 70–99)
Potassium: 4.9 mmol/L (ref 3.5–5.2)
Sodium: 137 mmol/L (ref 134–144)
eGFR: 54 mL/min/{1.73_m2} — ABNORMAL LOW (ref >59)

## 2022-02-14 ENCOUNTER — Other Ambulatory Visit: Payer: Self-pay | Admitting: Internal Medicine

## 2022-02-14 DIAGNOSIS — E785 Hyperlipidemia, unspecified: Secondary | ICD-10-CM

## 2022-02-14 DIAGNOSIS — K2100 Gastro-esophageal reflux disease with esophagitis, without bleeding: Secondary | ICD-10-CM

## 2022-02-14 DIAGNOSIS — M150 Primary generalized (osteo)arthritis: Secondary | ICD-10-CM

## 2022-02-14 DIAGNOSIS — I2510 Atherosclerotic heart disease of native coronary artery without angina pectoris: Secondary | ICD-10-CM

## 2022-02-14 DIAGNOSIS — F418 Other specified anxiety disorders: Secondary | ICD-10-CM

## 2022-02-14 DIAGNOSIS — M159 Polyosteoarthritis, unspecified: Secondary | ICD-10-CM

## 2022-02-14 DIAGNOSIS — M542 Cervicalgia: Secondary | ICD-10-CM

## 2022-02-20 ENCOUNTER — Ambulatory Visit (INDEPENDENT_AMBULATORY_CARE_PROVIDER_SITE_OTHER): Payer: PPO | Admitting: Family

## 2022-02-20 ENCOUNTER — Encounter (HOSPITAL_BASED_OUTPATIENT_CLINIC_OR_DEPARTMENT_OTHER): Payer: Self-pay | Admitting: Family

## 2022-02-20 VITALS — BP 146/62 | HR 72 | Ht 59.0 in | Wt 126.5 lb

## 2022-02-20 DIAGNOSIS — I471 Supraventricular tachycardia: Secondary | ICD-10-CM | POA: Diagnosis not present

## 2022-02-20 DIAGNOSIS — R002 Palpitations: Secondary | ICD-10-CM | POA: Diagnosis not present

## 2022-02-20 DIAGNOSIS — R42 Dizziness and giddiness: Secondary | ICD-10-CM

## 2022-02-20 DIAGNOSIS — I951 Orthostatic hypotension: Secondary | ICD-10-CM | POA: Diagnosis not present

## 2022-02-20 DIAGNOSIS — I10 Essential (primary) hypertension: Secondary | ICD-10-CM | POA: Diagnosis not present

## 2022-02-20 NOTE — Progress Notes (Signed)
Office Visit    Patient Name: [name] [name] Date of Encounter: 02/20/2022  PCP:  Janith Lima, MD   [place]  Cardiologist:  Sherren Mocha, MD  Advanced Practice Provider:  Liliane Shi, PA-C Electrophysiologist:  None   Chief Complaint    [name] [name] is a 81 y.o. female with a hx of CAD (inferior MI 2007 DES to RCA, 07/2017 POBA to [facility] and DES to dRCA, 03/2020 DES to RCA), bacterial endocarditis (2018), HTN, HLD, DM2 presents today for follow up after cardiac testing.   Past Medical History    Past Medical History:  Diagnosis Date   Allergy    Anal fissure    Anxiety    CAD (coronary artery disease)    s/p inf MI 2007 - tx w/ DES to RCA // s/p POBA to [facility] and DES to [place] in 11/18 ([facility] in [place], [place]) // Myoview 3/21: low risk // s/p DES to pRCA   Cataract    removed both eyes   Cerebrovascular disease    Carotid US 09/2017 [facility]): mild plaque (<50%) in both carotid arteries   Chronic neck pain    Chronic pain syndrome    Diabetes Mellitus, Type 2    Diverticular disease    DJD (degenerative joint disease)    Echocardiogram 10/2018    Echo 10/2018: EF 55-60, normal RVSF, mod MAC, mod TR, severe AoV calcification and sclerosis with nodular calcium/mobile area of calcium in the LVOT (small veg vs Lambl's excrescence  - consider TEE), mild AI, mild AS (mean 11).    Echocardiogram 04/2019    Echocardiogram 04/2019: EF 60-65, basal septal hypertrophy, grade 2 diastolic dysfunction, normal wall motion, normal RV SF, mild LAE, mod MAC, trivial MR, mod sclerosis of the aortic valve with mod aortic annular calcification, thin mobile filamentous structure on ventricular side of AV likely representing Lambl's excrescence, mild AI, mild TR   Frequent UTI's    Gastroesophageal reflux disease    H/O bacterial endocarditis    Rx w IV Abxs in 2020 ([facility] in [place], [place]) // F/u echo with AoV Lambl's excrescence   H/O hiatal hernia     HTN (hypertension)    Hx of fall 10/2020   Hx of MI 2007   Hx of Stroke    IBS (irritable bowel syndrome)    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 (Cervical Fusion)   Premature ventricular contractions    Tubular adenoma of colon    Ulcer    sam [place] gi   Urinary incontinence    Past Surgical History:  Procedure Laterality Date   APPENDECTOMY  1955   BLADDER REPAIR  2007   CARDIAC CATHETERIZATION  2007   Stents   CERVICAL SPINE SURGERY  07/2005   Dr Consuello Masse   CHOLECYSTECTOMY  2007   COLONOSCOPY     CORONARY PRESSURE WIRE/FFR WITH 3D MAPPING N/A 06/28/2021   Procedure: Coronary Pressure Wire/FFR w/3D Mapping;  Surgeon: Burnell Blanks, MD;  Location: [place] [facility];  Service: Cardiovascular;  Laterality: N/A;   CORONARY STENT INTERVENTION N/A 03/09/2020  Procedure: CORONARY STENT INTERVENTION;  Surgeon: Burnell Blanks, MD;  Location: [place] [facility];  Service: Cardiovascular;  Laterality: N/A;   CORONARY STENT INTERVENTION N/A 06/28/2021   Procedure: CORONARY STENT INTERVENTION;  Surgeon: Burnell Blanks, MD;  Location: [place] [facility];  Service: Cardiovascular;  Laterality: N/A;   DENTAL SURGERY  05/2013   replaced an inplant   EYE SURGERY  2011   Bilteral   I & D KNEE WITH POLY EXCHANGE  09/25/2011   Procedure: IRRIGATION AND DEBRIDEMENT KNEE WITH POLY EXCHANGE;  Surgeon: Johnny Bridge, MD;  Location: [place];  Service: Orthopedics;  Laterality: Left;   INCISION AND DRAINAGE ABSCESS / HEMATOMA OF BURSA / KNEE / THIGH  09/2011   JOINT REPLACEMENT  2012   left   LEFT HEART CATH AND CORONARY ANGIOGRAPHY N/A 03/09/2020   Procedure: LEFT HEART CATH AND  CORONARY ANGIOGRAPHY;  Surgeon: Burnell Blanks, MD;  Location: [place] [facility];  Service: Cardiovascular;  Laterality: N/A;   LEFT HEART CATH AND CORONARY ANGIOGRAPHY N/A 06/28/2021   Procedure: LEFT HEART CATH AND CORONARY ANGIOGRAPHY;  Surgeon: Burnell Blanks, MD;  Location: [place] [facility];  Service: Cardiovascular;  Laterality: N/A;   left Total Knee Replacement  11/2010   Dr Percell Miller   NECK SURGERY     has had 3 surgeries   POLYPECTOMY     POSTERIOR CERVICAL FUSION/FORAMINOTOMY  04/08/2012   Procedure: POSTERIOR CERVICAL FUSION/FORAMINOTOMY LEVEL 2;  Surgeon: Otilio Connors, MD;  Location: [place] [facility];  Service: Neurosurgery;  Laterality: Left;  Left Cervical six-seven Foraminotomy, bilateral cervical seven-thoracic one foraminotomy, cervical six-seven, cervical seven-thoracic one fusion with posterior instrumentation   TEMPOROMANDIBULAR JOINT SURGERY     thumb surgery     TONSILLECTOMY     TONSILLECTOMY  1950   TOTAL ABDOMINAL HYSTERECTOMY      Allergies  Allergies  Allergen Reactions   Nitrofurantoin Nausea Only    Severe nausea   Niacin And Related Other (See Comments)    Must take "Flush-free"   Ceftriaxone Itching   Codeine Nausea Only   Prednisone Itching and Rash   Sulfonamide Derivatives Itching   Tetracycline Itching and Rash    History of Present Illness    [name] [name] is a 81 y.o. female with a hx of CAD (inferior MI 2007 DES to RCA, 07/2017 POBA to [facility] and DES to dRCA, 03/2020 DES to RCA, 2022 LAD to DES), bacterial endocarditis (2018), HTN, HLD, DM2 last seen 12/21/21  She was seen 06/2021 for recurrent angina and set up for LHC demonstrating severe prox LAD stenosis and underwent PCI with DES to pLAD. Patent stent noted to RCA, mild tomoderate stenosis in non dominant LCx. Recommended for DAPT x 6 months. When last seen 07/2021 her Imdur was discontinued due to headache. Amlodipine was increased to 56m QD. Since that time her PCP reduced  her Amlodipine to 532mdue to LE edema.  Seen 12/21/21 with one week hx of lightheadedness. CBC, TSH unremarkable 14 day ZIO was sent for episodes of SVT, occasional PVC/PAC, brief NSVT.  Her metoprolol was increased to 1.5 mg tablet daily.  Echo 01/15/22 normal LVEF 60 to 65%, mild LVH, grade 1 diastolic dysfunction, mildly elevated PASP, mild MR/AI.  Carotid duplex 01/2022 bilateral 1-39% stenosis.  She presents today for follow up with her daughter-in-law. We reviewed cardiac testing in detail. Has not yet increased Metoprolol to 1.5 tablets as waiting on delivery of new pill  pack. Still with episodes of lightheadedness associated with palpitations. Also with lightheadedness with position changes - reviewed orthostatic precautions. Does note some fatigue. Reports no shortness of breath nor dyspnea on exertion. Reports no chest pain, pressure, or tightness. No edema, orthopnea, PND.    EKGs/Labs/Other Studies Reviewed:   The following studies were reviewed today:  Echo 01/15/22   1. Left ventricular ejection fraction, by estimation, is 60 to 65%. The  left ventricle has normal function. The left ventricle has no regional  wall motion abnormalities. There is mild concentric left ventricular  hypertrophy. Left ventricular diastolic  parameters are consistent with Grade I diastolic dysfunction (impaired  relaxation). Elevated left ventricular end-diastolic pressure. The average  left ventricular global longitudinal strain is -12.8 %. The global  longitudinal strain is abnormal.   2. Right ventricular systolic function is normal. The right ventricular  size is normal. There is mildly elevated pulmonary artery systolic  pressure.   3. Left atrial size was mildly dilated.   4. The mitral valve is degenerative. Mild mitral valve regurgitation. No  evidence of mitral stenosis.   5. The aortic valve is tricuspid. Aortic valve regurgitation is mild.  Aortic valve sclerosis/calcification is present,  without any evidence of  aortic stenosis.   6. Aortic Normal DTA.   7. The inferior vena cava is normal in size with greater than 50%  respiratory variability, suggesting right atrial pressure of 3 mmHg.   Monitor 01/2022 Patch Wear Time:  10 days and 16 hours (2023-04-20T15:22:27-0400 to 2023-05-01T08:10:14-0400)   Patient had a min HR of 53 bpm, max HR of 210 bpm, and avg HR of 74 bpm. Predominant underlying rhythm was Sinus Rhythm. 1 run of Ventricular Tachycardia occurred lasting 5 beats with a max rate of 167 bpm (avg 147 bpm). 74 Supraventricular Tachycardia  runs occurred, the run with the fastest interval lasting 6 beats with a max rate of 210 bpm, the longest lasting 12 beats with an avg rate of 130 bpm. Isolated SVEs were occasional (3.1%, 35076), SVE Couplets were rare (<1.0%, 1003), and SVE Triplets  were rare (<1.0%, 1163). Isolated VEs were rare (<1.0%, 104), VE Couplets were rare (<1.0%, 3), and no VE Triplets were present.    SUMMARY: findings as outlined above - NSR with an average HR of 74 bpm. No afib or flutter. No bradycardic events. Rare PVC's, occasional PAC's. No sustained arrhythmias.   Carotid duplex 01/2022 Summary:  Right Carotid: Velocities in the right ICA are consistent with a 1-39%  stenosis.   Left Carotid: Velocities in the left ICA are consistent with a 1-39%  stenosis.   Vertebrals: Bilateral vertebral arteries demonstrate antegrade flow.  Subclavians: Normal flow hemodynamics were seen in bilateral subclavian               arteries.   LHC 06/2021   Mid LAD lesion is 30% stenosed.   Prox Cx to Mid Cx lesion is 30% stenosed.   Mid RCA lesion is 10% stenosed.   Prox LAD to Mid LAD lesion is 70% stenosed.   A drug-eluting stent was successfully placed using a STENT ONYX FRONTIER 3.0X18.   Post intervention, there is a 0% residual stenosis.   Patent mid RCA stents   Severe proximal LAD stenosis that angiographically appears moderate to severe. RFR  assessment with pressure wire of 0.88.  Successful PTCA/DES x 1 proximal LAD (RFR guided) Mild to moderate stenosis in the non-dominant mid Circumflex Large dominant RCA with patent mid stents without  restenosis Normal LVEDP   Recommendations: Continue DAPT with ASA/Plavix. Continue beta blocker and statin.   EKG:  No EKG today.   Recent Labs: 05/02/2021: ALT 24 12/21/2021: Hemoglobin 14.3; Platelets 239; TSH 3.300 01/24/2022: BNP 55.3; Magnesium 2.1 02/12/2022: BUN 17; Creatinine, Ser 1.03; Potassium 4.9; Sodium 137  Recent Lipid Panel    Component Value Date/Time   CHOL 109 10/04/2021 0921   TRIG 110.0 10/04/2021 0921   TRIG 164 (H) 06/24/2006 0738   HDL 48.00 10/04/2021 0921   CHOLHDL 2 10/04/2021 0921   VLDL 22.0 10/04/2021 0921   LDLCALC 39 10/04/2021 0921   LDLDIRECT 42.0 09/12/2020 1516     Home Medications   No outpatient medications have been marked as taking for the 02/20/22 encounter (Appointment) with Loel Dubonnet, NP.     Review of Systems      All other systems reviewed and are otherwise negative except as noted above.  Physical Exam    VS:  There were no vitals taken for this visit. , BMI There is no height or weight on file to calculate BMI.  Wt Readings from Last 3 Encounters:  12/21/21 125 lb 9.6 oz (57 kg)  11/07/21 126 lb 6.4 oz (57.3 kg)  11/03/21 125 lb (56.7 kg)    GEN: Well nourished, well developed, in no acute distress. HEENT: normal. Neck: Supple, no JVD, carotid bruits, or masses. Cardiac: RRR, no murmurs, rubs, or gallops. No clubbing, cyanosis, edema.  Radials/PT 2+ and equal bilaterally.  Respiratory:  Respirations regular and unlabored, clear to auscultation bilaterally. GI: Soft, nontender, nondistended. MS: No deformity or atrophy. Skin: Warm and dry, no rash.  Neuro:  Strength and sensation are intact. Psych: Normal affect.  Assessment & Plan    Lightheadedness / Palpitations / SVT /PAC - Monitor with symptomatic PAC/SVT.  Recommended to increase Metoprolol to 1.5 tablets daily. She is waiting delivery from her pharmacy for pill pack - confirmed would be delivered this week. Echo, carotid duplex no significant abnormalities. Some lightheadedness consistent with orthostasis - encouraged slow position changes, compression socks, adequate hydration.   CAD - Stable with no anginal symptoms. No indication for ischemic evaluation.  GDMT includes aspirin, plavix, metoprolol, atorvastatin, zetia. Heart healthy diet and regular cardiovascular exercise encouraged.   HLD - Continue atorvastatin at current dose.   HTN - BP elevated in clinic though better controlled at home. She will contact our office if BP consistently >130/80 at home. Hesitant to adjust antihypertensive regimen due to patient reports of lightheadedness. Continue current antihyptertensive regimen. If needed at follow up, could increase Losartan. Adjust Metoprolol to 1.5 tabs daily as detailed above.     Disposition: Follow up in 3 months with Sherren Mocha, MD or APP.  Signed, Loel Dubonnet, NP 02/20/2022, 1:02 PM [place]

## 2022-02-20 NOTE — Patient Instructions (Addendum)
Medication Instructions:  Your physician recommends that you continue on your current medications as directed. Please refer to the Current Medication list given to you today.   *If you need a refill on your cardiac medications before your next appointment, please call your pharmacy*  Lab Work: NONE   Testing/Procedures: NONE  Follow-Up: At [facility], you and your health needs are our priority.  As part of our continuing mission to provide you with exceptional heart care, we have created designated Provider Care Teams.  These Care Teams include your primary Cardiologist (physician) and Advanced Practice Providers (APPs -  Physician Assistants and Nurse Practitioners) who all work together to provide you with the care you need, when you need it.  We recommend signing up for the patient portal called "MyChart".  Sign up information is provided on this After Visit Summary.  MyChart is used to connect with patients for Virtual Visits (Telemedicine).  Patients are able to view lab/test results, encounter notes, upcoming appointments, etc.  Non-urgent messages can be sent to your provider as well.   To learn more about what you can do with MyChart, go to [email or web address].    Your next appointment:   3 month(s)  The format for your next appointment:   In Person  Provider:   }  DR Cromberg NP   Other Instructions SPOKE [place] OF THE METOPROLOL THIS WEEK   Orthostatic Hypotension Blood pressure is a measurement of how strongly, or weakly, your circulating blood is pressing against the walls of your arteries. Orthostatic hypotension is a drop in blood pressure that can happen when you change positions, such as when you go from lying down to standing. Arteries are blood vessels that carry blood from your heart throughout your body. When blood pressure is too low, you may not get enough blood to your brain or to the rest of your  organs. Orthostatic hypotension can cause light-headedness, sweating, rapid heartbeat, blurred vision, and fainting. These symptoms require further investigation into the cause. What are the causes? Orthostatic hypotension can be caused by many things, including: Sudden changes in posture, such as standing up quickly after you have been sitting or lying down. Loss of blood (anemia) or loss of body fluids (dehydration). Heart problems, neurologic problems, or hormone problems. Pregnancy. Aging. The risk for this condition increases as you get older. Severe infection (sepsis). Certain medicines, such as medicines for high blood pressure or medicines that make the body lose excess fluids (diuretics). What are the signs or symptoms? Symptoms of this condition may include: Weakness, light-headedness, or dizziness. Sweating. Blurred vision. Tiredness (fatigue). Rapid heartbeat. Fainting, in severe cases. How is this diagnosed? This condition is diagnosed based on: Your symptoms and medical history. Your blood pressure measurements. Your health care provider will check your blood pressure when you are: Lying down. Sitting. Standing. A blood pressure reading is recorded as two numbers, such as "120 over 80" (or 120/80). The first ("top") number is called the systolic pressure. It is a measure of the pressure in your arteries as your heart beats. The second ("bottom") number is called the diastolic pressure. It is a measure of the pressure in your arteries when your heart relaxes between beats. Blood pressure is measured in a unit called mmHg. Healthy blood pressure for most adults is 120/80 mmHg. Orthostatic hypotension is defined as a 20 mmHg drop in systolic pressure or a 10 mmHg drop in diastolic  pressure within 3 minutes of standing. Other information or tests that may be used to diagnose orthostatic hypotension include: Your other vital signs, such as your heart rate and temperature. Blood  tests. An electrocardiogram (ECG) or echocardiogram. A Holter monitor. This is a device you wear that records your heart rhythm continuously, usually for 24-48 hours. Tilt table test. For this test, you will be safely secured to a table that moves you from a lying position to an upright position. Your heart rhythm and blood pressure will be monitored during the test. How is this treated? This condition may be treated by: Changing your diet. This may involve eating more salt (sodium) or drinking more water. Changing the dosage of certain medicines you are taking that might be lowering your blood pressure. Correcting the underlying reason for the orthostatic hypotension. Wearing compression stockings. Taking medicines to raise your blood pressure. Avoiding actions that trigger symptoms. Follow these instructions at home: Medicines Take over-the-counter and prescription medicines only as told by your health care provider. Follow instructions from your health care provider about changing the dosage of your current medicines, if this applies. Do not stop or adjust any of your medicines on your own. Eating and drinking  Drink enough fluid to keep your urine pale yellow. Eat extra salt only as directed. Do not add extra salt to your diet unless advised by your health care provider. Eat frequent, small meals. Avoid standing up suddenly after eating. General instructions  Get up slowly from lying down or sitting positions. This gives your blood pressure a chance to adjust. Avoid hot showers and excessive heat as directed by your health care provider. Engage in regular physical activity as directed by your health care provider. If you have compression stockings, wear them as told. Keep all follow-up visits. This is important. Contact a health care provider if: You have a fever for more than 2-3 days. You feel more thirsty than usual. You feel dizzy or weak. Get help right away if: You have  chest pain. You have a fast or irregular heartbeat. You become sweaty or feel light-headed. You feel short of breath. You faint. You have any symptoms of a stroke. "BE FAST" is an easy way to remember the main warning signs of a stroke: B - Balance. Signs are dizziness, sudden trouble walking, or loss of balance. E - Eyes. Signs are trouble seeing or a sudden change in vision. F - Face. Signs are sudden weakness or numbness of the face, or the face or eyelid drooping on one side. A - Arms. Signs are weakness or numbness in an arm. This happens suddenly and usually on one side of the body. S - Speech. Signs are sudden trouble speaking, slurred speech, or trouble understanding what people say. T - Time. Time to call emergency services. Write down what time symptoms started. You have other signs of a stroke, such as: A sudden, severe headache with no known cause. Nausea or vomiting. Seizure. These symptoms may represent a serious problem that is an emergency. Do not wait to see if the symptoms will go away. Get medical help right away. Call your local emergency services (911 in the [place] Do not drive yourself to the hospital. Summary Orthostatic hypotension is a sudden drop in blood pressure. It can cause light-headedness, sweating, rapid heartbeat, blurred vision, and fainting. Orthostatic hypotension can be diagnosed by having your blood pressure taken while lying down, sitting, and then standing. Treatment may involve changing your diet, wearing compression  stockings, sitting up slowly, adjusting your medicines, or correcting the underlying reason for the orthostatic hypotension. Get help right away if you have chest pain, a fast or irregular heartbeat, or symptoms of a stroke. This information is not intended to replace advice given to you by your health care provider. Make sure you discuss any questions you have with your health care provider. Document Revised: 11/03/2020 Document  Reviewed: 11/03/2020 Elsevier Patient Education  [place]

## 2022-02-21 ENCOUNTER — Telehealth: Payer: Self-pay | Admitting: Internal Medicine

## 2022-02-21 NOTE — Telephone Encounter (Signed)
Pt reported  being tired.States in the past Vitamin D level has been low. Wants to know if she needs an appt. To have levels checked again.  Pt also was calling in to report that she can not afford Dexalant for acid reflux. Requesting cheaper alternative.   Please Advise

## 2022-02-22 ENCOUNTER — Other Ambulatory Visit: Payer: Self-pay | Admitting: Internal Medicine

## 2022-02-22 DIAGNOSIS — K2100 Gastro-esophageal reflux disease with esophagitis, without bleeding: Secondary | ICD-10-CM

## 2022-02-22 MED ORDER — PANTOPRAZOLE SODIUM 40 MG PO TBEC: 40.0000 mg | DELAYED_RELEASE_TABLET | Freq: Every day | ORAL | 1 refills | Status: DC

## 2022-03-05 ENCOUNTER — Ambulatory Visit (INDEPENDENT_AMBULATORY_CARE_PROVIDER_SITE_OTHER): Payer: PPO

## 2022-03-05 ENCOUNTER — Ambulatory Visit (INDEPENDENT_AMBULATORY_CARE_PROVIDER_SITE_OTHER): Payer: PPO | Admitting: Internal Medicine

## 2022-03-05 ENCOUNTER — Encounter: Payer: Self-pay | Admitting: Internal Medicine

## 2022-03-05 VITALS — BP 132/78 | HR 66 | Temp 97.7°F | Resp 16 | Ht 59.0 in | Wt 124.0 lb

## 2022-03-05 DIAGNOSIS — J157 Pneumonia due to Mycoplasma pneumoniae: Secondary | ICD-10-CM | POA: Insufficient documentation

## 2022-03-05 DIAGNOSIS — E118 Type 2 diabetes mellitus with unspecified complications: Secondary | ICD-10-CM | POA: Diagnosis not present

## 2022-03-05 DIAGNOSIS — I10 Essential (primary) hypertension: Secondary | ICD-10-CM | POA: Diagnosis not present

## 2022-03-05 DIAGNOSIS — N390 Urinary tract infection, site not specified: Secondary | ICD-10-CM

## 2022-03-05 DIAGNOSIS — R509 Fever, unspecified: Secondary | ICD-10-CM | POA: Insufficient documentation

## 2022-03-05 DIAGNOSIS — R5383 Other fatigue: Secondary | ICD-10-CM | POA: Diagnosis not present

## 2022-03-05 DIAGNOSIS — G4452 New daily persistent headache (NDPH): Secondary | ICD-10-CM | POA: Insufficient documentation

## 2022-03-05 DIAGNOSIS — B9689 Other specified bacterial agents as the cause of diseases classified elsewhere: Secondary | ICD-10-CM

## 2022-03-05 DIAGNOSIS — R519 Headache, unspecified: Secondary | ICD-10-CM | POA: Diagnosis not present

## 2022-03-05 LAB — URINALYSIS, ROUTINE W REFLEX MICROSCOPIC
Bilirubin Urine: NEGATIVE
Hgb urine dipstick: NEGATIVE
Ketones, ur: NEGATIVE
Nitrite: POSITIVE — AB
RBC / HPF: NONE SEEN (ref 0–?)
Specific Gravity, Urine: 1.015 (ref 1.000–1.030)
Total Protein, Urine: NEGATIVE
Urine Glucose: NEGATIVE
Urobilinogen, UA: 1 (ref 0.0–1.0)
pH: 6 (ref 5.0–8.0)

## 2022-03-05 LAB — CBC WITH DIFFERENTIAL/PLATELET
Basophils Absolute: 0.1 10*3/uL (ref 0.0–0.1)
Basophils Relative: 1.3 % (ref 0.0–3.0)
Eosinophils Absolute: 0.2 10*3/uL (ref 0.0–0.7)
Eosinophils Relative: 2.8 % (ref 0.0–5.0)
HCT: 38.6 % (ref 36.0–46.0)
Hemoglobin: 12.9 g/dL (ref 12.0–15.0)
Lymphocytes Relative: 30 % (ref 12.0–46.0)
Lymphs Abs: 2.1 10*3/uL (ref 0.7–4.0)
MCHC: 33.3 g/dL (ref 30.0–36.0)
MCV: 94.9 fl (ref 78.0–100.0)
Monocytes Absolute: 0.7 10*3/uL (ref 0.1–1.0)
Monocytes Relative: 10.7 % (ref 3.0–12.0)
Neutro Abs: 3.8 10*3/uL (ref 1.4–7.7)
Neutrophils Relative %: 55.2 % (ref 43.0–77.0)
Platelets: 282 10*3/uL (ref 150.0–400.0)
RBC: 4.07 Mil/uL (ref 3.87–5.11)
RDW: 14.1 % (ref 11.5–15.5)
WBC: 6.9 10*3/uL (ref 4.0–10.5)

## 2022-03-05 LAB — C-REACTIVE PROTEIN: CRP: <1 mg/dL (ref 0.5–20.0)

## 2022-03-05 LAB — HEPATIC FUNCTION PANEL
ALT: 29 U/L (ref 0–35)
AST: 22 U/L (ref 0–37)
Albumin: 4.2 g/dL (ref 3.5–5.2)
Alkaline Phosphatase: 66 U/L (ref 39–117)
Bilirubin, Direct: 0.2 mg/dL (ref 0.0–0.3)
Total Bilirubin: 0.6 mg/dL (ref 0.2–1.2)
Total Protein: 7 g/dL (ref 6.0–8.3)

## 2022-03-05 LAB — HEMOGLOBIN A1C: Hgb A1c MFr Bld: 7.2 % — ABNORMAL HIGH (ref 4.6–6.5)

## 2022-03-05 LAB — SEDIMENTATION RATE: Sed Rate: 7 mm/hr (ref 0–30)

## 2022-03-05 MED ORDER — AZITHROMYCIN 500 MG PO TABS: 500.0000 mg | ORAL_TABLET | Freq: Every day | ORAL | 0 refills | Status: DC

## 2022-03-05 MED ORDER — AZITHROMYCIN 500 MG PO TABS: 500.0000 mg | ORAL_TABLET | Freq: Every day | ORAL | 0 refills | Status: AC

## 2022-03-05 NOTE — Patient Instructions (Signed)
Community-Acquired Pneumonia, Adult Pneumonia is a lung infection that causes inflammation and the buildup of mucus and fluids in the lungs. This may cause coughing and difficulty breathing. Community-acquired pneumonia is pneumonia that develops in people who are not, and have not recently been, in a hospital or other health care facility. Usually, pneumonia develops as a result of an illness that is caused by a virus, such as the common cold and the flu (influenza). It can also be caused by bacteria or fungi. While the common cold and influenza can pass from person to person (are contagious), pneumonia itself is not considered contagious. What are the causes? This condition may be caused by: Viruses. Bacteria. Fungi, such as molds or mushrooms. What increases the risk? The following factors may make you more likely to develop this condition: Having certain medical conditions, such as: A long-term (chronic) disease, which may include chronic obstructive pulmonary disease (COPD), asthma, heart failure, cystic fibrosis, diabetes, kidney disease, sickle cell disease, and human immunodeficiency virus (HIV). A condition that increases the risk of breathing in (aspirating) mucus and other fluids from your mouth and nose. A weakened body defense system (immune system). Having had your spleen removed (splenectomy). The spleen is the organ that helps fight germs and infections. Not cleaning your teeth and gums well (poor dental hygiene). Using tobacco products. Traveling to places where germs that cause pneumonia are present. Being near certain animals, or animal habitats, that have germs that cause pneumonia. Being older than 82 years of age. What are the signs or symptoms? Symptoms of this condition include: A dry cough or a wet (productive) cough. A fever. Sweating or chills. Chest pain, especially when breathing deeply or coughing. Fast breathing, difficulty breathing, or shortness of  breath. Tiredness (fatigue). Muscle aches. How is this diagnosed? This condition may be diagnosed based on your medical history or a physical exam. You may also have tests, including: Chest X-rays. Tests of the level of oxygen and other gases in your blood. Tests of: Your blood. Mucus from your lungs (sputum). Fluid around your lungs (pleural fluid). Your urine. If your pneumonia is severe, other tests may be done to learn more about the cause. How is this treated? Treatment for this condition depends on many factors, such as the cause of your pneumonia, your medicines, and other medical conditions that you have. For most adults, pneumonia may be treated at home. In some cases, treatment must happen in a hospital and may include: Medicines that are given by mouth (orally) or through an IV, including: Antibiotic medicines, if bacteria caused the pneumonia. Medicines that kill viruses (antiviral medicines), if a virus caused the pneumonia. Oxygen therapy. Severe pneumonia, although rare, may require the following treatments: Mechanical ventilation.This procedure uses a machine to help you breathe if you cannot breathe well on your own or maintain a safe level of blood oxygen. Thoracentesis. This procedure removes any buildup of pleural fluid to help with breathing. Follow these instructions at home:  Medicines Take over-the-counter and prescription medicines only as told by your health care provider. Take cough medicine only if you have trouble sleeping. Cough medicine can prevent your body from removing mucus from your lungs. If you were prescribed an antibiotic medicine, take it as told by your health care provider. Do not stop taking the antibiotic even if you start to feel better. Lifestyle     Do not drink alcohol. Do not use any products that contain nicotine or tobacco, such as cigarettes, e-cigarettes, and chewing   tobacco. If you need help quitting, ask your health care  provider. Eat a healthy diet. This includes plenty of vegetables, fruits, whole grains, low-fat dairy products, and lean protein. General instructions Rest a lot and get at least 8 hours of sleep each night. Sleep in a partly upright position at night. Place a few pillows under your head or sleep in a reclining chair. Return to your normal activities as told by your health care provider. Ask your health care provider what activities are safe for you. Drink enough fluid to keep your urine pale yellow. This helps to thin the mucus in your lungs. If your throat is sore, gargle with a salt-water mixture 3-4 times a day or as needed. To make a salt-water mixture, completely dissolve -1 tsp (3-6 g) of salt in 1 cup (237 mL) of warm water. Keep all follow-up visits as told by your health care provider. This is important. How is this prevented? You can lower your risk of developing community-acquired pneumonia by: Getting the pneumonia vaccine. There are different types and schedules of pneumonia vaccines. Ask your health care provider which option is best for you. Consider getting the pneumonia vaccine if: You are older than 82 years of age. You are 19-65 years of age and are receiving cancer treatment, have chronic lung disease, or have other medical conditions that affect your immune system. Ask your health care provider if this applies to you. Getting your influenza vaccine every year. Ask your health care provider which type of vaccine is best for you. Getting regular dental checkups. Washing your hands often with soap and water for at least 20 seconds. If soap and water are not available, use hand sanitizer. Contact a health care provider if you have: A fever. Trouble sleeping because you cannot control your cough with cough medicine. Get help right away if: Your shortness of breath becomes worse. Your chest pain increases. Your sickness becomes worse, especially if you are an older adult or  have a weak immune system. You cough up blood. These symptoms may represent a serious problem that is an emergency. Do not wait to see if the symptoms will go away. Get medical help right away. Call your local emergency services (911 in the [place] Do not drive yourself to the hospital. Summary Pneumonia is an infection of the lungs. Community-acquired pneumonia develops in people who have not been in the hospital. It can be caused by bacteria, viruses, or fungi. This condition may be treated with antibiotics or antiviral medicines. Severe pneumonia may require a hospital stay and treatment to help with breathing. This information is not intended to replace advice given to you by your health care provider. Make sure you discuss any questions you have with your health care provider. Document Revised: 06/02/2019 Document Reviewed: 06/02/2019 Elsevier Patient Education  2023 [employer]  

## 2022-03-05 NOTE — Telephone Encounter (Signed)
Stated mother is falling a lot and is confused a lot,light headed, was taking pills for sleep that affected b/p meds.   Pt has scheduled appt this morning7/3/23

## 2022-03-05 NOTE — Progress Notes (Signed)
Subjective:  Patient ID: [name] [name], female    DOB: [birth year]  Age: 81 y.o. MRN: [id number]  CC: Headache   HPI [name] presents for f/up -  She recently saw cardiology for evaluation of shortness of breath.  She now complains of a 2-week history of fever to 101 and headache.  Outpatient Medications Prior to Visit  Medication Sig Dispense Refill   amLODipine (NORVASC) 5 MG tablet Take 1 tablet (5 mg total) by mouth daily. 90 tablet 3   Ascorbic Acid (VITAMIN C) 500 MG CAPS Take 500 mg by mouth in the morning and at bedtime.     aspirin EC 81 MG tablet Take 81 mg by mouth every morning.      atorvastatin (LIPITOR) 80 MG tablet TAKE ONE TABLET BY MOUTH EVERYDAY AT BEDTIME 90 tablet 1   celecoxib (CELEBREX) 50 MG capsule TAKE ONE CAPSULE BY MOUTH twice A DAY 180 capsule 0   Cinnamon 500 MG TABS Take 1,000 mg by mouth daily.     clopidogrel (PLAVIX) 75 MG tablet TAKE ONE TABLET BY MOUTH EVERY MORNING 90 tablet 1   dicyclomine (BENTYL) 10 MG capsule TAKE ONE CAPSULE BY MOUTH FOUR TIMES DAILY 360 capsule 0   ezetimibe (ZETIA) 10 MG tablet TAKE ONE TABLET BY MOUTH EVERY MORNING 90 tablet 1   gabapentin (NEURONTIN) 300 MG capsule TAKE TWO CAPSULES BY MOUTH EVERY MORNING and TAKE TWO CAPSULES BY MOUTH EVERYDAY AT BEDTIME 360 capsule 1   Krill Oil 1000 MG CAPS Take 1,000 mg by mouth daily.     losartan (COZAAR) 25 MG tablet Take 1 tablet (25 mg total) by mouth daily. 90 tablet 3   magnesium oxide (MAG-OX) 400 MG tablet Take 400 mg by mouth daily.     metoprolol succinate (TOPROL-XL) 50 MG 24 hr tablet Take 1.5 tablets (75 mg total) by mouth every morning. 135 tablet 3   Misc Natural Products (FOCUSED MIND PO) Take 15 mg by mouth daily.     nitroGLYCERIN (NITROSTAT) 0.4 MG SL tablet DISSOLVE 1 TABLET UNDER THE TONGUE EVERY 5 MINUTES AS NEEDED FOR CHEST PAIN. DO NOT EXCEED A TOTAL OF 3 DOSES IN 15 MINUTES. 25 tablet 2   omega-3 acid ethyl esters (LOVAZA) 1 g capsule Take 1 g by mouth  2 (two) times daily.     pantoprazole (PROTONIX) 40 MG tablet Take 1 tablet (40 mg total) by mouth daily. 90 tablet 1   Psyllium (DAILY FIBER PO) Take 1 capsule by mouth in the morning and at bedtime.     saccharomyces boulardii (FLORASTOR) 250 MG capsule Take 250 mg by mouth 2 (two) times daily.     TURMERIC PO Take 1,000 mg by mouth in the morning and at bedtime.     venlafaxine XR (EFFEXOR-XR) 37.5 MG 24 hr capsule TAKE ONE TABLET BY MOUTH EVERY MORNING 90 capsule 1   vitamin E 1000 UNIT capsule Take 1,000 Units by mouth in the morning and at bedtime.     No facility-administered medications prior to visit.    ROS Review of Systems  Constitutional:  Positive for fatigue and fever. Negative for chills.  HENT: Negative.  Negative for sinus pressure, sore throat and trouble swallowing.   Eyes: Negative.   Respiratory:  Positive for shortness of breath. Negative for cough, chest tightness and wheezing.   Cardiovascular:  Negative for chest pain, palpitations and leg swelling.  Gastrointestinal:  Negative for abdominal pain, constipation, diarrhea, nausea and vomiting.  Endocrine: Negative.   Genitourinary:  Negative for decreased urine volume, difficulty urinating, dysuria, hematuria and urgency.  Musculoskeletal: Negative.   Skin: Negative.  Negative for color change and rash.  Neurological:  Positive for dizziness and headaches. Negative for syncope and light-headedness.  Hematological:  Negative for adenopathy. Does not bruise/bleed easily.  Psychiatric/Behavioral: Negative.      Objective:  BP 132/78 (BP Location: Right Arm, Patient Position: Sitting, Cuff Size: Normal)   Pulse 66   Temp 97.7 F (36.5 C) (Oral)   Resp 16   Ht '4\' 11"'$  (1.499 m)   Wt 124 lb (56.2 kg)   SpO2 93%   BMI 25.04 kg/m   BP Readings from Last 3 Encounters:  03/05/22 132/78  02/20/22 (!) 146/62  12/21/21 (!) 144/78    Wt Readings from Last 3 Encounters:  03/05/22 124 lb (56.2 kg)  02/20/22 126  lb 8 oz (57.4 kg)  12/21/21 125 lb 9.6 oz (57 kg)    Physical Exam Vitals reviewed.  Constitutional:      General: She is not in acute distress.    Appearance: She is not ill-appearing, toxic-appearing or diaphoretic.  HENT:     Nose: Nose normal.     Mouth/Throat:     Mouth: Mucous membranes are moist.  Eyes:     General: No scleral icterus.    Conjunctiva/sclera: Conjunctivae normal.  Cardiovascular:     Rate and Rhythm: Normal rate and regular rhythm.     Heart sounds: S1 normal and S2 normal. Murmur heard.     Systolic murmur is present with a grade of 1/6.     No friction rub. No gallop.  Pulmonary:     Effort: Pulmonary effort is normal.     Breath sounds: No stridor. No wheezing, rhonchi or rales.  Abdominal:     General: Abdomen is flat.     Palpations: There is no mass.     Tenderness: There is no abdominal tenderness. There is no guarding.     Hernia: No hernia is present.  Musculoskeletal:        General: No swelling.     Cervical back: Normal range of motion and neck supple. No tenderness.     Right lower leg: No edema.     Left lower leg: No edema.  Lymphadenopathy:     Cervical: No cervical adenopathy.  Skin:    General: Skin is warm and dry.     Coloration: Skin is not pale.     Findings: No erythema or rash.  Neurological:     General: No focal deficit present.     Mental Status: She is alert. Mental status is at baseline.  Psychiatric:        Mood and Affect: Mood normal.        Behavior: Behavior normal.     Lab Results  Component Value Date   WBC 6.9 03/05/2022   HGB 12.9 03/05/2022   HCT 38.6 03/05/2022   PLT 282.0 03/05/2022   GLUCOSE 138 (H) 02/12/2022   CHOL 109 10/04/2021   TRIG 110.0 10/04/2021   HDL 48.00 10/04/2021   LDLDIRECT 42.0 09/12/2020   LDLCALC 39 10/04/2021   ALT 29 03/05/2022   AST 22 03/05/2022   NA 137 02/12/2022   K 4.9 02/12/2022   CL 95 (L) 02/12/2022   CREATININE 1.03 (H) 02/12/2022   BUN 17 02/12/2022    CO2 25 02/12/2022   TSH 3.300 12/21/2021   INR 1.0 05/02/2021  HGBA1C 7.2 (H) 03/05/2022   MICROALBUR 2.5 (H) 10/04/2021    CARDIAC CATHETERIZATION  Result Date: 06/28/2021   Mid LAD lesion is 30% stenosed.   Prox Cx to Mid Cx lesion is 30% stenosed.   Mid RCA lesion is 10% stenosed.   Prox LAD to Mid LAD lesion is 70% stenosed.   A drug-eluting stent was successfully placed using a STENT ONYX FRONTIER [id number].   Post intervention, there is a 0% residual stenosis.   Patent mid RCA stents Severe proximal LAD stenosis that angiographically appears moderate to severe. RFR assessment with pressure wire of 0.88. Successful PTCA/DES x 1 proximal LAD (RFR guided) Mild to moderate stenosis in the non-dominant mid Circumflex Large dominant RCA with patent mid stents without restenosis Normal LVEDP Recommendations: Continue DAPT with ASA/Plavix. Continue beta blocker and statin.   No results found.   Assessment & Plan:   Jessabelle was seen today for headache.  Diagnoses and all orders for this visit:  Essential hypertension- Her blood pressure is adequately well controlled. -     CBC with Differential/Platelet; Future -     Hepatic function panel; Future -     Hepatic function panel -     CBC with Differential/Platelet  Controlled type 2 diabetes mellitus with complication, without long-term current use of insulin ([place])- Her blood sugar is adequately well controlled. -     Hemoglobin A1c; Future -     Hemoglobin A1c  New daily persistent headache- Her labs are negative for secondary causes. -     CBC with Differential/Platelet; Future -     B. burgdorfi antibodies by WB; Future -     Rocky mtn spotted fvr abs pnl(IgG+IgM); Future -     C-reactive protein; Future -     Sedimentation rate; Future -     Hepatic function panel; Future -     Hepatic function panel -     Sedimentation rate -     C-reactive protein -     Rocky mtn spotted fvr abs pnl(IgG+IgM) -     B. burgdorfi antibodies by  WB -     CBC with Differential/Platelet  Fever, unspecified fever cause -     CBC with Differential/Platelet; Future -     B. burgdorfi antibodies by WB; Future -     Rocky mtn spotted fvr abs pnl(IgG+IgM); Future -     Urinalysis, Routine w reflex microscopic; Future -     CULTURE, URINE COMPREHENSIVE; Future -     C-reactive protein; Future -     Sedimentation rate; Future -     Hepatic function panel; Future -     DG Chest 2 View; Future -     Hepatic function panel -     Sedimentation rate -     C-reactive protein -     CULTURE, URINE COMPREHENSIVE -     Urinalysis, Routine w reflex microscopic -     Rocky mtn spotted fvr abs pnl(IgG+IgM) -     B. burgdorfi antibodies by WB -     CBC with Differential/Platelet  Pneumonia of both lungs due to Mycoplasma pneumoniae, unspecified part of lung -     Discontinue: azithromycin (ZITHROMAX) 500 MG tablet; Take 1 tablet (500 mg total) by mouth daily for 3 days. -     azithromycin (ZITHROMAX) 500 MG tablet; Take 1 tablet (500 mg total) by mouth daily for 3 days.  UTI due to Klebsiella species- She is asymptomatic with this.  She is likely colonized with this.  We will treat only if she becomes symptomatic.   I am having Shalice F. Capitano "FAYE" maintain her Vitamin C, aspirin EC, Krill Oil, Cinnamon, TURMERIC PO, magnesium oxide, vitamin E, saccharomyces boulardii, Psyllium (DAILY FIBER PO), omega-3 acid ethyl esters, Misc Natural Products (FOCUSED MIND PO), amLODipine, nitroGLYCERIN, dicyclomine, metoprolol succinate, losartan, gabapentin, ezetimibe, atorvastatin, clopidogrel, venlafaxine XR, celecoxib, and pantoprazole.  Meds ordered this encounter  Medications   DISCONTD: azithromycin (ZITHROMAX) 500 MG tablet    Sig: Take 1 tablet (500 mg total) by mouth daily for 3 days.    Dispense:  3 tablet    Refill:  0   azithromycin (ZITHROMAX) 500 MG tablet    Sig: Take 1 tablet (500 mg total) by mouth daily for 3 days.    Dispense:  3  tablet    Refill:  0     Follow-up: Return in about 1 week (around 03/12/2022).  [name], MD

## 2022-03-12 DIAGNOSIS — N390 Urinary tract infection, site not specified: Secondary | ICD-10-CM | POA: Insufficient documentation

## 2022-03-12 LAB — B. BURGDORFI ANTIBODIES BY WB

## 2022-03-12 LAB — ROCKY MTN SPOTTED FVR ABS PNL(IGG+IGM)
RMSF IgG: NOT DETECTED
RMSF IgM: NOT DETECTED

## 2022-03-12 LAB — CULTURE, URINE COMPREHENSIVE

## 2022-03-14 ENCOUNTER — Encounter: Payer: Self-pay | Admitting: Internal Medicine

## 2022-03-14 ENCOUNTER — Ambulatory Visit (INDEPENDENT_AMBULATORY_CARE_PROVIDER_SITE_OTHER): Payer: PPO | Admitting: Internal Medicine

## 2022-03-14 VITALS — BP 130/68 | HR 67 | Temp 98.2°F | Ht 59.0 in | Wt 125.0 lb

## 2022-03-14 DIAGNOSIS — R509 Fever, unspecified: Secondary | ICD-10-CM

## 2022-03-14 DIAGNOSIS — N390 Urinary tract infection, site not specified: Secondary | ICD-10-CM | POA: Diagnosis not present

## 2022-03-14 DIAGNOSIS — B9689 Other specified bacterial agents as the cause of diseases classified elsewhere: Secondary | ICD-10-CM

## 2022-03-14 DIAGNOSIS — R9389 Abnormal findings on diagnostic imaging of other specified body structures: Secondary | ICD-10-CM | POA: Diagnosis not present

## 2022-03-14 MED ORDER — LEVOFLOXACIN 250 MG PO TABS: 250.0000 mg | ORAL_TABLET | Freq: Every day | ORAL | 0 refills | Status: AC

## 2022-03-14 NOTE — Patient Instructions (Signed)
Urinary Tract Infection, Adult  A urinary tract infection (UTI) is an infection of any part of the urinary tract. The urinary tract includes the kidneys, ureters, bladder, and urethra. These organs make, store, and get rid of urine in the body. An upper UTI affects the ureters and kidneys. A lower UTI affects the bladder and urethra. What are the causes? Most urinary tract infections are caused by bacteria in your genital area around your urethra, where urine leaves your body. These bacteria grow and cause inflammation of your urinary tract. What increases the risk? You are more likely to develop this condition if: You have a urinary catheter that stays in place. You are not able to control when you urinate or have a bowel movement (incontinence). You are female and you: Use a spermicide or diaphragm for birth control. Have low estrogen levels. Are pregnant. You have certain genes that increase your risk. You are sexually active. You take antibiotic medicines. You have a condition that causes your flow of urine to slow down, such as: An enlarged prostate, if you are female. Blockage in your urethra. A kidney stone. A nerve condition that affects your bladder control (neurogenic bladder). Not getting enough to drink, or not urinating often. You have certain medical conditions, such as: Diabetes. A weak disease-fighting system (immunesystem). Sickle cell disease. Gout. Spinal cord injury. What are the signs or symptoms? Symptoms of this condition include: Needing to urinate right away (urgency). Frequent urination. This may include small amounts of urine each time you urinate. Pain or burning with urination. Blood in the urine. Urine that smells bad or unusual. Trouble urinating. Cloudy urine. Vaginal discharge, if you are female. Pain in the abdomen or the lower back. You may also have: Vomiting or a decreased appetite. Confusion. Irritability or tiredness. A fever or  chills. Diarrhea. The first symptom in older adults may be confusion. In some cases, they may not have any symptoms until the infection has worsened. How is this diagnosed? This condition is diagnosed based on your medical history and a physical exam. You may also have other tests, including: Urine tests. Blood tests. Tests for STIs (sexually transmitted infections). If you have had more than one UTI, a cystoscopy or imaging studies may be done to determine the cause of the infections. How is this treated? Treatment for this condition includes: Antibiotic medicine. Over-the-counter medicines to treat discomfort. Drinking enough water to stay hydrated. If you have frequent infections or have other conditions such as a kidney stone, you may need to see a health care provider who specializes in the urinary tract (urologist). In rare cases, urinary tract infections can cause sepsis. Sepsis is a life-threatening condition that occurs when the body responds to an infection. Sepsis is treated in the hospital with IV antibiotics, fluids, and other medicines. Follow these instructions at home:  Medicines Take over-the-counter and prescription medicines only as told by your health care provider. If you were prescribed an antibiotic medicine, take it as told by your health care provider. Do not stop using the antibiotic even if you start to feel better. General instructions Make sure you: Empty your bladder often and completely. Do not hold urine for long periods of time. Empty your bladder after sex. Wipe from front to back after urinating or having a bowel movement if you are female. Use each tissue only one time when you wipe. Drink enough fluid to keep your urine pale yellow. Keep all follow-up visits. This is important. Contact a health   care provider if: Your symptoms do not get better after 1-2 days. Your symptoms go away and then return. Get help right away if: You have severe pain in  your back or your lower abdomen. You have a fever or chills. You have nausea or vomiting. Summary A urinary tract infection (UTI) is an infection of any part of the urinary tract, which includes the kidneys, ureters, bladder, and urethra. Most urinary tract infections are caused by bacteria in your genital area. Treatment for this condition often includes antibiotic medicines. If you were prescribed an antibiotic medicine, take it as told by your health care provider. Do not stop using the antibiotic even if you start to feel better. Keep all follow-up visits. This is important. This information is not intended to replace advice given to you by your health care provider. Make sure you discuss any questions you have with your health care provider. Document Revised: 04/01/2020 Document Reviewed: 04/01/2020 [employer] Patient Education  2023 [employer]  

## 2022-03-14 NOTE — Progress Notes (Signed)
Subjective:  Patient ID: [name] [name], female    DOB: May 13, 1941  Age: 81 y.o. MRN: [id number]  CC: Urinary Tract Infection   HPI [name] [name] presents for f/up -  She continues to complain of fatigue and low-grade fever.  She completed Zithromax with no improvement.  She denies cough, wheezing, chest pain, shortness of breath, dysuria, or hematuria.  Outpatient Medications Prior to Visit  Medication Sig Dispense Refill   amLODipine (NORVASC) 5 MG tablet Take 1 tablet (5 mg total) by mouth daily. 90 tablet 3   Ascorbic Acid (VITAMIN C) 500 MG CAPS Take 500 mg by mouth in the morning and at bedtime.     aspirin EC 81 MG tablet Take 81 mg by mouth every morning.      atorvastatin (LIPITOR) 80 MG tablet TAKE ONE TABLET BY MOUTH EVERYDAY AT BEDTIME 90 tablet 1   celecoxib (CELEBREX) 50 MG capsule TAKE ONE CAPSULE BY MOUTH twice A DAY 180 capsule 0   Cinnamon 500 MG TABS Take 1,000 mg by mouth daily.     clopidogrel (PLAVIX) 75 MG tablet TAKE ONE TABLET BY MOUTH EVERY MORNING 90 tablet 1   dicyclomine (BENTYL) 10 MG capsule TAKE ONE CAPSULE BY MOUTH FOUR TIMES DAILY 360 capsule 0   ezetimibe (ZETIA) 10 MG tablet TAKE ONE TABLET BY MOUTH EVERY MORNING 90 tablet 1   gabapentin (NEURONTIN) 300 MG capsule TAKE TWO CAPSULES BY MOUTH EVERY MORNING and TAKE TWO CAPSULES BY MOUTH EVERYDAY AT BEDTIME 360 capsule 1   Krill Oil 1000 MG CAPS Take 1,000 mg by mouth daily.     losartan (COZAAR) 25 MG tablet Take 1 tablet (25 mg total) by mouth daily. 90 tablet 3   magnesium oxide (MAG-OX) 400 MG tablet Take 400 mg by mouth daily.     metoprolol succinate (TOPROL-XL) 50 MG 24 hr tablet Take 1.5 tablets (75 mg total) by mouth every morning. 135 tablet 3   Misc Natural Products (FOCUSED MIND PO) Take 15 mg by mouth daily.     nitroGLYCERIN (NITROSTAT) 0.4 MG SL tablet DISSOLVE 1 TABLET UNDER THE TONGUE EVERY 5 MINUTES AS NEEDED FOR CHEST PAIN. DO NOT EXCEED A TOTAL OF 3 DOSES IN 15 MINUTES. 25 tablet 2    omega-3 acid ethyl esters (LOVAZA) 1 g capsule Take 1 g by mouth 2 (two) times daily.     pantoprazole (PROTONIX) 40 MG tablet Take 1 tablet (40 mg total) by mouth daily. 90 tablet 1   Psyllium (DAILY FIBER PO) Take 1 capsule by mouth in the morning and at bedtime.     saccharomyces boulardii (FLORASTOR) 250 MG capsule Take 250 mg by mouth 2 (two) times daily.     TURMERIC PO Take 1,000 mg by mouth in the morning and at bedtime.     venlafaxine XR (EFFEXOR-XR) 37.5 MG 24 hr capsule TAKE ONE TABLET BY MOUTH EVERY MORNING 90 capsule 1   vitamin E 1000 UNIT capsule Take 1,000 Units by mouth in the morning and at bedtime.     No facility-administered medications prior to visit.    ROS Review of Systems  Constitutional:  Positive for fatigue and fever. Negative for chills.  HENT: Negative.    Eyes: Negative.   Respiratory:  Negative for cough, chest tightness, shortness of breath and wheezing.   Cardiovascular:  Negative for chest pain, palpitations and leg swelling.  Gastrointestinal:  Negative for abdominal pain, constipation, diarrhea, nausea and vomiting.  Endocrine: Negative.  Genitourinary: Negative.  Negative for decreased urine volume, difficulty urinating, dysuria and urgency.  Musculoskeletal: Negative.   Skin: Negative.   Neurological:  Negative for dizziness, weakness, light-headedness and headaches.  Hematological:  Negative for adenopathy. Does not bruise/bleed easily.  Psychiatric/Behavioral: Negative.      Objective:  BP 130/68 (BP Location: Right Arm, Patient Position: Sitting, Cuff Size: Large)   Pulse 67   Temp 98.2 F (36.8 C) (Oral)   Ht '4\' 11"'$  (1.499 m)   Wt 125 lb (56.7 kg)   SpO2 90%   BMI 25.25 kg/m   BP Readings from Last 3 Encounters:  03/14/22 130/68  03/05/22 132/78  02/20/22 (!) 146/62    Wt Readings from Last 3 Encounters:  03/14/22 125 lb (56.7 kg)  03/05/22 124 lb (56.2 kg)  02/20/22 126 lb 8 oz (57.4 kg)    Physical Exam Vitals  reviewed.  Constitutional:      General: She is not in acute distress.    Appearance: She is not ill-appearing, toxic-appearing or diaphoretic.  HENT:     Nose: Nose normal.     Mouth/Throat:     Mouth: Mucous membranes are moist.  Eyes:     General: No scleral icterus.    Conjunctiva/sclera: Conjunctivae normal.  Cardiovascular:     Rate and Rhythm: Normal rate and regular rhythm.     Heart sounds: No murmur heard. Pulmonary:     Effort: Pulmonary effort is normal.     Breath sounds: No stridor. No wheezing, rhonchi or rales.  Abdominal:     General: Abdomen is flat.     Palpations: There is no mass.     Tenderness: There is no abdominal tenderness. There is no guarding.     Hernia: No hernia is present.  Musculoskeletal:        General: Normal range of motion.     Cervical back: Neck supple.     Right lower leg: No edema.     Left lower leg: No edema.  Lymphadenopathy:     Cervical: No cervical adenopathy.  Skin:    General: Skin is warm and dry.  Neurological:     General: No focal deficit present.     Mental Status: She is alert.     Lab Results  Component Value Date   WBC 6.9 03/05/2022   HGB 12.9 03/05/2022   HCT 38.6 03/05/2022   PLT 282.0 03/05/2022   GLUCOSE 138 (H) 02/12/2022   CHOL 109 10/04/2021   TRIG 110.0 10/04/2021   HDL 48.00 10/04/2021   LDLDIRECT 42.0 09/12/2020   LDLCALC 39 10/04/2021   ALT 29 03/05/2022   AST 22 03/05/2022   NA 137 02/12/2022   K 4.9 02/12/2022   CL 95 (L) 02/12/2022   CREATININE 1.03 (H) 02/12/2022   BUN 17 02/12/2022   CO2 25 02/12/2022   TSH 3.300 12/21/2021   INR 1.0 05/02/2021   HGBA1C 7.2 (H) 03/05/2022   MICROALBUR 2.5 (H) 10/04/2021   Component 9 d ago  Source: NOT GIVEN   Status: FINAL   Isolate 1: Klebsiella pneumoniae Abnormal    Comment: Greater than 100,000 CFU/mL of Klebsiella pneumoniae  Resulting Agency [facility] [place]     Susceptibility   Klebsiella pneumoniae    CULT, URN,  SPECIAL NEGATIVE 1    AMOX/CLAVULANIC <=2  Sensitive    AMPICILLIN 16  Resistant    AMPICILLIN/SULBACTAM <=2  Sensitive    CEFAZOLIN <=4  Not Reportable 1  CEFEPIME <=1  Sensitive    CEFTAZIDIME <=1  Sensitive    CEFTRIAXONE <=1  Sensitive    CIPROFLOXACIN <=0.25  Sensitive    GENTAMICIN <=1  Sensitive    IMIPENEM 0.5  Sensitive    LEVOFLOXACIN <=0.12  Sensitive    NITROFURANTOIN 128  Resistant    PIP/TAZO <=4  Sensitive    TOBRAMYCIN <=1  Sensitive    TRIMETH/SULFA <=20  Sensitive 2            CARDIAC CATHETERIZATION  Result Date: 06/28/2021   Mid LAD lesion is 30% stenosed.   Prox Cx to Mid Cx lesion is 30% stenosed.   Mid RCA lesion is 10% stenosed.   Prox LAD to Mid LAD lesion is 70% stenosed.   A drug-eluting stent was successfully placed using a STENT ONYX FRONTIER [id number].   Post intervention, there is a 0% residual stenosis.   Patent mid RCA stents Severe proximal LAD stenosis that angiographically appears moderate to severe. RFR assessment with pressure wire of 0.88. Successful PTCA/DES x 1 proximal LAD (RFR guided) Mild to moderate stenosis in the non-dominant mid Circumflex Large dominant RCA with patent mid stents without restenosis Normal LVEDP Recommendations: Continue DAPT with ASA/Plavix. Continue beta blocker and statin.   DG Chest 2 View  Result Date: 03/05/2022 CLINICAL DATA:  Fever. Headaches. Fatigue. Nonsmoker. History of pneumonia and bronchitis. EXAM: CHEST - 2 VIEW COMPARISON:  06/17/2014 FINDINGS: Right upper quadrant surgical clips. Cervical spine fixation. Mild to moderate right hemidiaphragm elevation is not significantly changed. Midline trachea. Normal heart size. Atherosclerosis in the transverse aorta. No pleural effusion or pneumothorax. Subtle interstitial thickening has developed since the 2015 exam. Slightly more apparent on the right. No lobar consolidation. IMPRESSION: Development of subtle interstitial thickening over the past 8 years. Although  this could simply represent interval chronic bronchitis, an acute viral or mycoplasma pneumonia cannot be entirely excluded. Depending on clinical symptomatology, consider radiographic follow-up at 5-7 days. No lobar consolidation. Aortic Atherosclerosis (ICD10-I70.0). Electronically Signed   By: Abigail Miyamoto M.D.   On: 03/05/2022 08:59   VAS US CAROTID  Result Date: 01/15/2022 Carotid Arterial Duplex Study Patient Name:  [name] [name]  Date of Exam:   01/15/2022 Medical Rec #: [id number]        Accession #:    [id number] Date of Birth: 04/02/1941        Patient Gender: F Patient Age:   77 years Exam Location:  [facility] Procedure:      VAS US CAROTID Referring Phys: Laurann Montana --------------------------------------------------------------------------------  Indications:  Lightheaded [R42 (ICD-10-CM)]; Near syncope [phone] (ICD-10-CM)]. Risk Factors: Hypertension, hyperlipidemia, coronary artery disease. Performing Technologist: Luane School RDCS  Examination Guidelines: A complete evaluation includes B-mode imaging, spectral Doppler, color Doppler, and power Doppler as needed of all accessible portions of each vessel. Bilateral testing is considered an integral part of a complete examination. Limited examinations for reoccurring indications may be performed as noted.  Right Carotid Findings: +----------+--------+--------+--------+------------------+--------+           PSV cm/sEDV cm/sStenosisPlaque DescriptionComments +----------+--------+--------+--------+------------------+--------+ CCA Prox  102     0                                          +----------+--------+--------+--------+------------------+--------+ CCA Distal69      10                                         +----------+--------+--------+--------+------------------+--------+  ICA Prox  76      13      1-39%   calcific                   +----------+--------+--------+--------+------------------+--------+ ICA Mid   80       15                                         +----------+--------+--------+--------+------------------+--------+ ICA Distal64      12                                         +----------+--------+--------+--------+------------------+--------+ ECA       117     0                                          +----------+--------+--------+--------+------------------+--------+ +----------+--------+-------+----------------+-------------------+           PSV cm/sEDV cmsDescribe        Arm Pressure (mmHG) +----------+--------+-------+----------------+-------------------+ [id number]            Multiphasic, WNL                    +----------+--------+-------+----------------+-------------------+ +---------+--------+--+--------+-+---------+ VertebralPSV cm/s57EDV cm/s2Antegrade +---------+--------+--+--------+-+---------+  Left Carotid Findings: +----------+--------+--------+--------+------------------+--------+           PSV cm/sEDV cm/sStenosisPlaque DescriptionComments +----------+--------+--------+--------+------------------+--------+ CCA Prox  97      0                                          +----------+--------+--------+--------+------------------+--------+ CCA Distal66      10                                         +----------+--------+--------+--------+------------------+--------+ ICA Prox  135     22      1-39%                              +----------+--------+--------+--------+------------------+--------+ ICA Mid   146     29      1-39%                     tortuous +----------+--------+--------+--------+------------------+--------+ ICA Distal101     14                                         +----------+--------+--------+--------+------------------+--------+ ECA       139     0                                          +----------+--------+--------+--------+------------------+--------+  +----------+--------+--------+----------------+-------------------+           PSV cm/sEDV cm/sDescribe        Arm Pressure (mmHG) +----------+--------+--------+----------------+-------------------+ [id number]  Multiphasic, WNL                    +----------+--------+--------+----------------+-------------------+ +---------+--------+--+--------+-+---------+ VertebralPSV cm/s87EDV cm/s3Antegrade +---------+--------+--+--------+-+---------+   Summary: Right Carotid: Velocities in the right ICA are consistent with a 1-39% stenosis. Left Carotid: Velocities in the left ICA are consistent with a 1-39% stenosis. Vertebrals:  Bilateral vertebral arteries demonstrate antegrade flow. Subclavians: Normal flow hemodynamics were seen in bilateral subclavian              arteries. *See table(s) above for measurements and observations.  Electronically signed by Shirlee More MD on 01/15/2022 at 5:00:15 PM.    Final    ECHOCARDIOGRAM COMPLETE  Result Date: 01/15/2022    ECHOCARDIOGRAM REPORT   Patient Name:   [name] [name] Graff Date of Exam: 01/15/2022 Medical Rec #:  [id number]       Height:       59.0 in Accession #:    [id number]      Weight:       125.6 lb Date of Birth:  [birth year]       BSA:          1.513 m Patient Age:    8 years        BP:           144/78 mmHg Patient Gender: F               HR:           66 bpm. Exam Location:  [place] Procedure: 2D Echo, Cardiac Doppler, Color Doppler and Strain Analysis Indications:    Dyspnea R06.00  History:        Patient has prior history of Echocardiogram examinations, most                 recent 05/01/2019. CAD, Signs/Symptoms:Chest Pain; Risk                 Factors:Dyslipidemia and Hypertension.  Sonographer:    Luane School RDCS Referring Phys: [id number] [place]  1. Left ventricular ejection fraction, by estimation, is 60 to 65%. The left ventricle has normal function. The left ventricle has no regional wall motion abnormalities.  There is mild concentric left ventricular hypertrophy. Left ventricular diastolic parameters are consistent with Grade I diastolic dysfunction (impaired relaxation). Elevated left ventricular end-diastolic pressure. The average left ventricular global longitudinal strain is -12.8 %. The global longitudinal strain is abnormal.  2. Right ventricular systolic function is normal. The right ventricular size is normal. There is mildly elevated pulmonary artery systolic pressure.  3. Left atrial size was mildly dilated.  4. The mitral valve is degenerative. Mild mitral valve regurgitation. No evidence of mitral stenosis.  5. The aortic valve is tricuspid. Aortic valve regurgitation is mild. Aortic valve sclerosis/calcification is present, without any evidence of aortic stenosis.  6. Aortic Normal DTA.  7. The inferior vena cava is normal in size with greater than 50% respiratory variability, suggesting right atrial pressure of 3 mmHg. FINDINGS  Left Ventricle: Left ventricular ejection fraction, by estimation, is 60 to 65%. The left ventricle has normal function. The left ventricle has no regional wall motion abnormalities. The average left ventricular global longitudinal strain is -12.8 %. The global longitudinal strain is abnormal. The left ventricular internal cavity size was normal in size. There is mild concentric left ventricular hypertrophy. Left ventricular diastolic parameters are consistent with Grade I diastolic dysfunction (impaired relaxation). Elevated left ventricular end-diastolic pressure. Right Ventricle: The right  ventricular size is normal. No increase in right ventricular wall thickness. Right ventricular systolic function is normal. There is mildly elevated pulmonary artery systolic pressure. The tricuspid regurgitant velocity is 2.97  m/s, and with an assumed right atrial pressure of 3 mmHg, the estimated right ventricular systolic pressure is 79.8 mmHg. Left Atrium: Left atrial size was mildly  dilated. Right Atrium: Right atrial size was normal in size. Pericardium: There is no evidence of pericardial effusion. Mitral Valve: The mitral valve is degenerative in appearance. Mild mitral annular calcification. Mild mitral valve regurgitation. No evidence of mitral valve stenosis. Tricuspid Valve: The tricuspid valve is normal in structure. Tricuspid valve regurgitation is mild . No evidence of tricuspid stenosis. Aortic Valve: The aortic valve is tricuspid. Aortic valve regurgitation is mild. Aortic regurgitation PHT measures 445 msec. Aortic valve sclerosis/calcification is present, without any evidence of aortic stenosis. Aortic valve mean gradient measures 10.0 mmHg. Aortic valve peak gradient measures 18.2 mmHg. Aortic valve area, by VTI measures 2.27 cm. Pulmonic Valve: The pulmonic valve was normal in structure. Pulmonic valve regurgitation is not visualized. No evidence of pulmonic stenosis. Aorta: The aortic root and ascending aorta are structurally normal, with no evidence of dilitation, the aortic arch was not well visualized and Normal DTA. Venous: A normal flow pattern is recorded from the right upper pulmonary vein. The inferior vena cava is normal in size with greater than 50% respiratory variability, suggesting right atrial pressure of 3 mmHg. IAS/Shunts: No atrial level shunt detected by color flow Doppler.  LEFT VENTRICLE PLAX 2D LVIDd:         3.80 cm   Diastology LVIDs:         2.60 cm   LV e' medial:    5.11 cm/s LV PW:         1.30 cm   LV E/e' medial:  17.3 LV IVS:        1.20 cm   LV e' lateral:   6.74 cm/s LVOT diam:     2.00 cm   LV E/e' lateral: 13.1 LV SV:         107 LV SV Index:   71        2D Longitudinal Strain LVOT Area:     3.14 cm  2D Strain GLS Avg:     -12.8 %  RIGHT VENTRICLE             IVC RV S prime:     11.40 cm/s  IVC diam: 1.10 cm TAPSE (M-mode): 2.1 cm LEFT ATRIUM             Index        RIGHT ATRIUM           Index LA diam:        3.90 cm 2.58 cm/m   RA Area:      12.80 cm LA Vol (A2C):   44.3 ml 29.27 ml/m  RA Volume:   24.90 ml  16.45 ml/m LA Vol (A4C):   56.5 ml 37.33 ml/m LA Biplane Vol: 52.4 ml 34.62 ml/m  AORTIC VALVE AV Area (Vmax):    2.22 cm AV Area (Vmean):   2.32 cm AV Area (VTI):     2.27 cm AV Vmax:           213.50 cm/s AV Vmean:          150.000 cm/s AV VTI:            0.473 m AV Peak Grad:  18.2 mmHg AV Mean Grad:      10.0 mmHg LVOT Vmax:         151.00 cm/s LVOT Vmean:        111.000 cm/s LVOT VTI:          0.342 m LVOT/AV VTI ratio: 0.72 AI PHT:            445 msec  AORTA Ao Root diam: 3.00 cm Ao Asc diam:  2.90 cm Ao Desc diam: 1.90 cm MITRAL VALVE               TRICUSPID VALVE MV Area (PHT): 4.44 cm    TR Peak grad:   35.3 mmHg MV Decel Time: 171 msec    TR Vmax:        297.00 cm/s MV E velocity: 88.30 cm/s MV A velocity: 79.70 cm/s  SHUNTS MV E/A ratio:  1.11        Systemic VTI:  0.34 m                            Systemic Diam: 2.00 cm Shirlee More MD Electronically signed by Shirlee More MD Signature Date/Time: 01/15/2022/4:55:36 PM    Final    LONG TERM MONITOR (3-14 DAYS)  Result Date: 01/09/2022 Patch Wear Time:  10 days and 16 hours (2023-04-20T15:22:27-0400 to 2023-05-01T08:10:14-0400) Patient had a min HR of 53 bpm, max HR of 210 bpm, and avg HR of 74 bpm. Predominant underlying rhythm was Sinus Rhythm. 1 run of Ventricular Tachycardia occurred lasting 5 beats with a max rate of 167 bpm (avg 147 bpm). 74 Supraventricular Tachycardia runs occurred, the run with the fastest interval lasting 6 beats with a max rate of 210 bpm, the longest lasting 12 beats with an avg rate of 130 bpm. Isolated SVEs were occasional (3.1%, 35076), SVE Couplets were rare (<1.0%, 1003), and SVE Triplets were rare (<1.0%, 1163). Isolated VEs were rare (<1.0%, 104), VE Couplets were rare (<1.0%, 3), and no VE Triplets were present. SUMMARY: findings as outlined above - NSR with an average HR of 74 bpm. No afib or flutter. No bradycardic events. Rare PVC's,  occasional PAC's. No sustained arrhythmias.     Assessment & Plan:   [name] [name] was seen today for urinary tract infection.  Diagnoses and all orders for this visit:  UTI due to Klebsiella species -     levofloxacin (LEVAQUIN) 250 MG tablet; Take 1 tablet (250 mg total) by mouth daily for 7 days.  Fever, unspecified fever cause -     CT Chest W Contrast; Future  Abnormal finding on chest xray- Will evaluate the lungs as a source of infection with a CT scan with contrast. -     CT Chest W Contrast; Future   I am having [name] F. Brucks "FAYE" start on levofloxacin. I am also having her maintain her Vitamin C, aspirin EC, Krill Oil, Cinnamon, TURMERIC PO, magnesium oxide, vitamin E, saccharomyces boulardii, Psyllium (DAILY FIBER PO), omega-3 acid ethyl esters, Misc Natural Products (FOCUSED MIND PO), amLODipine, nitroGLYCERIN, dicyclomine, metoprolol succinate, losartan, gabapentin, ezetimibe, atorvastatin, clopidogrel, venlafaxine XR, celecoxib, and pantoprazole.  Meds ordered this encounter  Medications   levofloxacin (LEVAQUIN) 250 MG tablet    Sig: Take 1 tablet (250 mg total) by mouth daily for 7 days.    Dispense:  7 tablet    Refill:  0     Follow-up: Return in about 4 weeks (around 04/11/2022).  Marcello Moores  Ronnald Ramp, MD

## 2022-03-15 ENCOUNTER — Ambulatory Visit: Payer: PPO | Admitting: Internal Medicine

## 2022-03-16 ENCOUNTER — Ambulatory Visit: Payer: PPO | Admitting: Family Medicine

## 2022-03-20 ENCOUNTER — Ambulatory Visit: Payer: PPO | Admitting: Internal Medicine

## 2022-03-22 ENCOUNTER — Ambulatory Visit
Admission: RE | Admit: 2022-03-22 | Discharge: 2022-03-22 | Disposition: A | Discharge status: Home / Self Care | Payer: PPO | Source: Ambulatory Visit | Attending: Internal Medicine | Admitting: Internal Medicine

## 2022-03-22 ENCOUNTER — Other Ambulatory Visit: Payer: Self-pay | Admitting: Internal Medicine

## 2022-03-22 DIAGNOSIS — I700 Atherosclerosis of aorta: Secondary | ICD-10-CM | POA: Diagnosis not present

## 2022-03-22 DIAGNOSIS — R911 Solitary pulmonary nodule: Secondary | ICD-10-CM | POA: Insufficient documentation

## 2022-03-22 DIAGNOSIS — R509 Fever, unspecified: Secondary | ICD-10-CM

## 2022-03-22 DIAGNOSIS — J9811 Atelectasis: Secondary | ICD-10-CM | POA: Diagnosis not present

## 2022-03-22 DIAGNOSIS — R9389 Abnormal findings on diagnostic imaging of other specified body structures: Secondary | ICD-10-CM

## 2022-03-22 DIAGNOSIS — J984 Other disorders of lung: Secondary | ICD-10-CM | POA: Diagnosis not present

## 2022-03-22 DIAGNOSIS — R918 Other nonspecific abnormal finding of lung field: Secondary | ICD-10-CM | POA: Insufficient documentation

## 2022-03-22 DIAGNOSIS — Z8701 Personal history of pneumonia (recurrent): Secondary | ICD-10-CM | POA: Diagnosis not present

## 2022-03-22 MED ORDER — IOPAMIDOL (ISOVUE-300) INJECTION 61%: 100.0000 mL | Freq: Once | INTRAVENOUS | Status: DC | PRN

## 2022-03-22 MED ORDER — IOPAMIDOL (ISOVUE-300) INJECTION 61%
75.0000 mL | Freq: Once | INTRAVENOUS | Status: AC | PRN
  Administered 2022-03-22: 75 mL via INTRAVENOUS

## 2022-03-22 NOTE — Progress Notes (Unsigned)
CT Chest W Contrast  Result Date: 03/22/2022 CLINICAL DATA:  Dyspnea on exertion, pneumonia. EXAM: CT CHEST WITH CONTRAST TECHNIQUE: Multidetector CT imaging of the chest was performed during intravenous contrast administration. RADIATION DOSE REDUCTION: This exam was performed according to the departmental dose-optimization program which includes automated exposure control, adjustment of the mA and/or kV according to patient size and/or use of iterative reconstruction technique. CONTRAST:  91m ISOVUE-300 IOPAMIDOL (ISOVUE-300) INJECTION 61% COMPARISON:  Radiographs March 05, 2022. FINDINGS: Cardiovascular: Atherosclerosis of thoracic aorta is noted without aneurysm or dissection. Normal cardiac size. No pericardial effusion. Moderate coronary artery calcifications are noted. Mediastinum/Nodes: No enlarged mediastinal, hilar, or axillary lymph nodes. Thyroid gland, trachea, and esophagus demonstrate no significant findings. Lungs/Pleura: No pneumothorax or pleural effusion is noted. 19 x 14 mm irregular density is noted in left lung apex concerning for malignancy. Minimal right posterior basilar subsegmental atelectasis is noted. Upper Abdomen: Hepatic steatosis. Musculoskeletal: No chest wall abnormality. No acute or significant osseous findings. IMPRESSION: 19 x 14 mm irregular nodular density is noted in left lung apex concerning for malignancy. PET scan is recommended for further evaluation. These results will be called to the ordering clinician or representative by the Radiologist Assistant, and communication documented in the PACS or zVision Dashboard. Moderate coronary artery calcifications are noted. Hepatic steatosis. Aortic Atherosclerosis (ICD10-I70.0). Electronically Signed   By: JMarijo ConceptionM.D.   On: 03/22/2022 15:55

## 2022-03-27 IMAGING — DX DG HAND COMPLETE 3+V*R*
3 series · 3 of 3 positions shown · non-contrast
Comparison: Finger radiograph 04/23/2014

CLINICAL DATA: Fall

EXAM:
RIGHT HAND - COMPLETE 3+ VIEW

[hand ap]
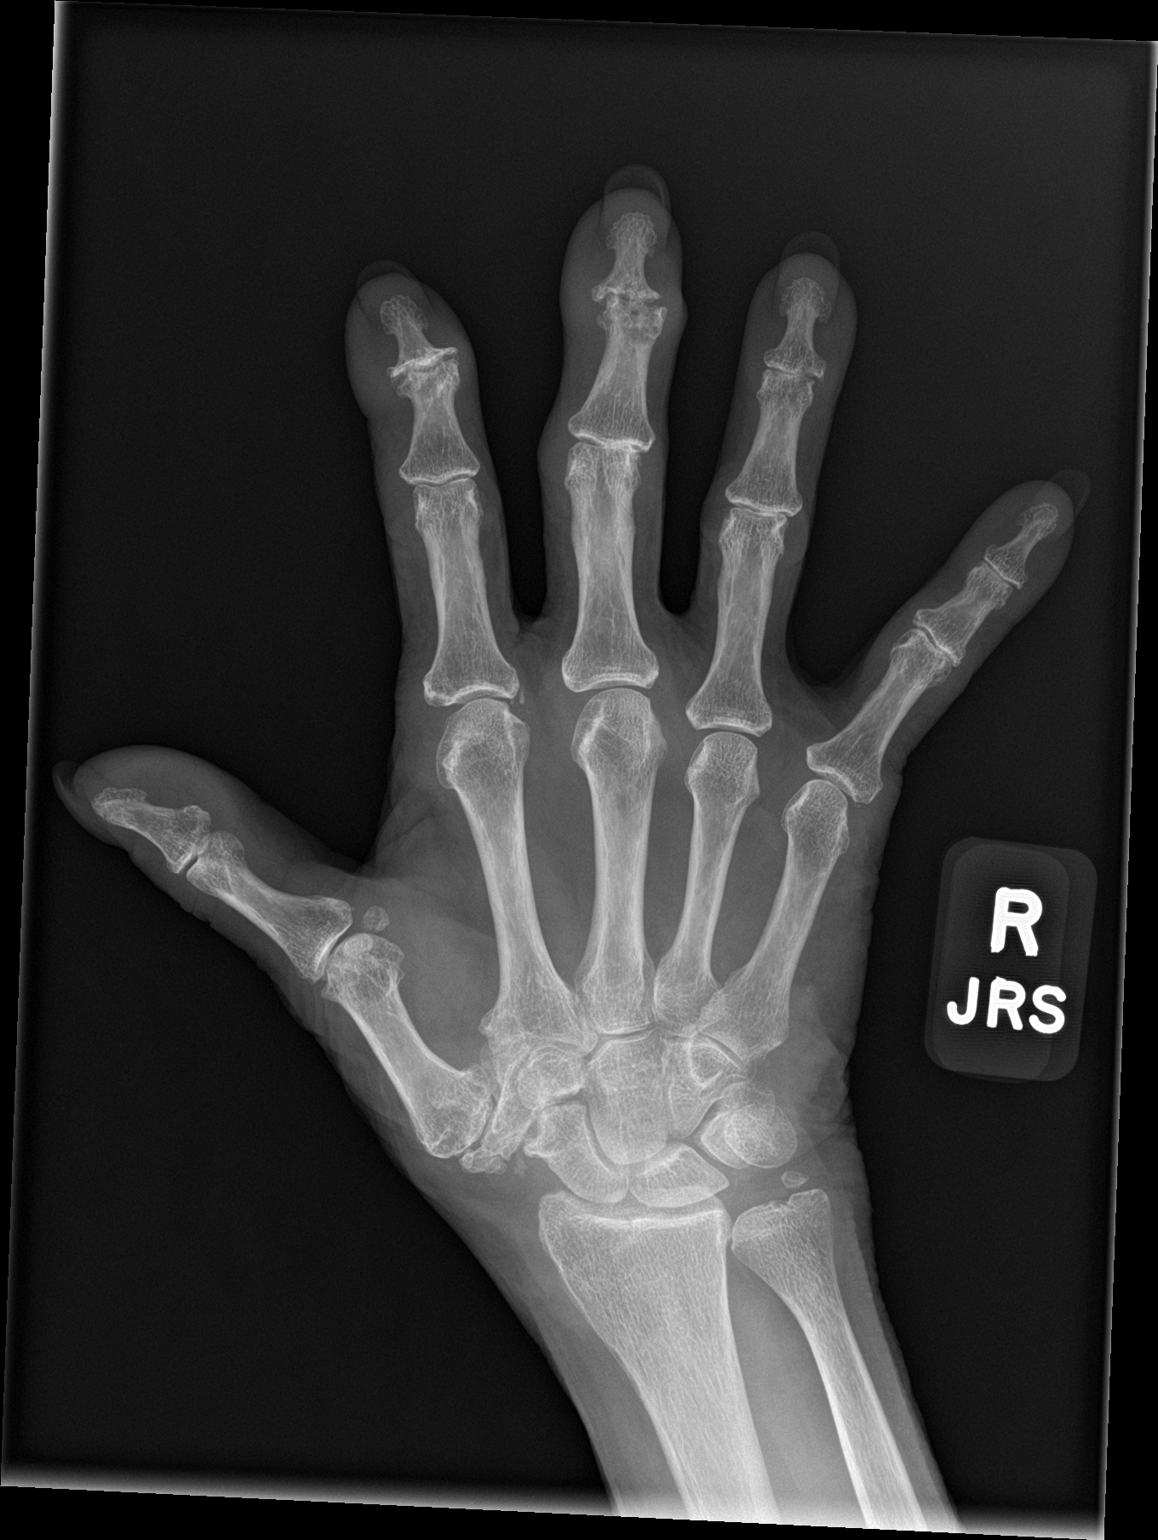

[hand obl]
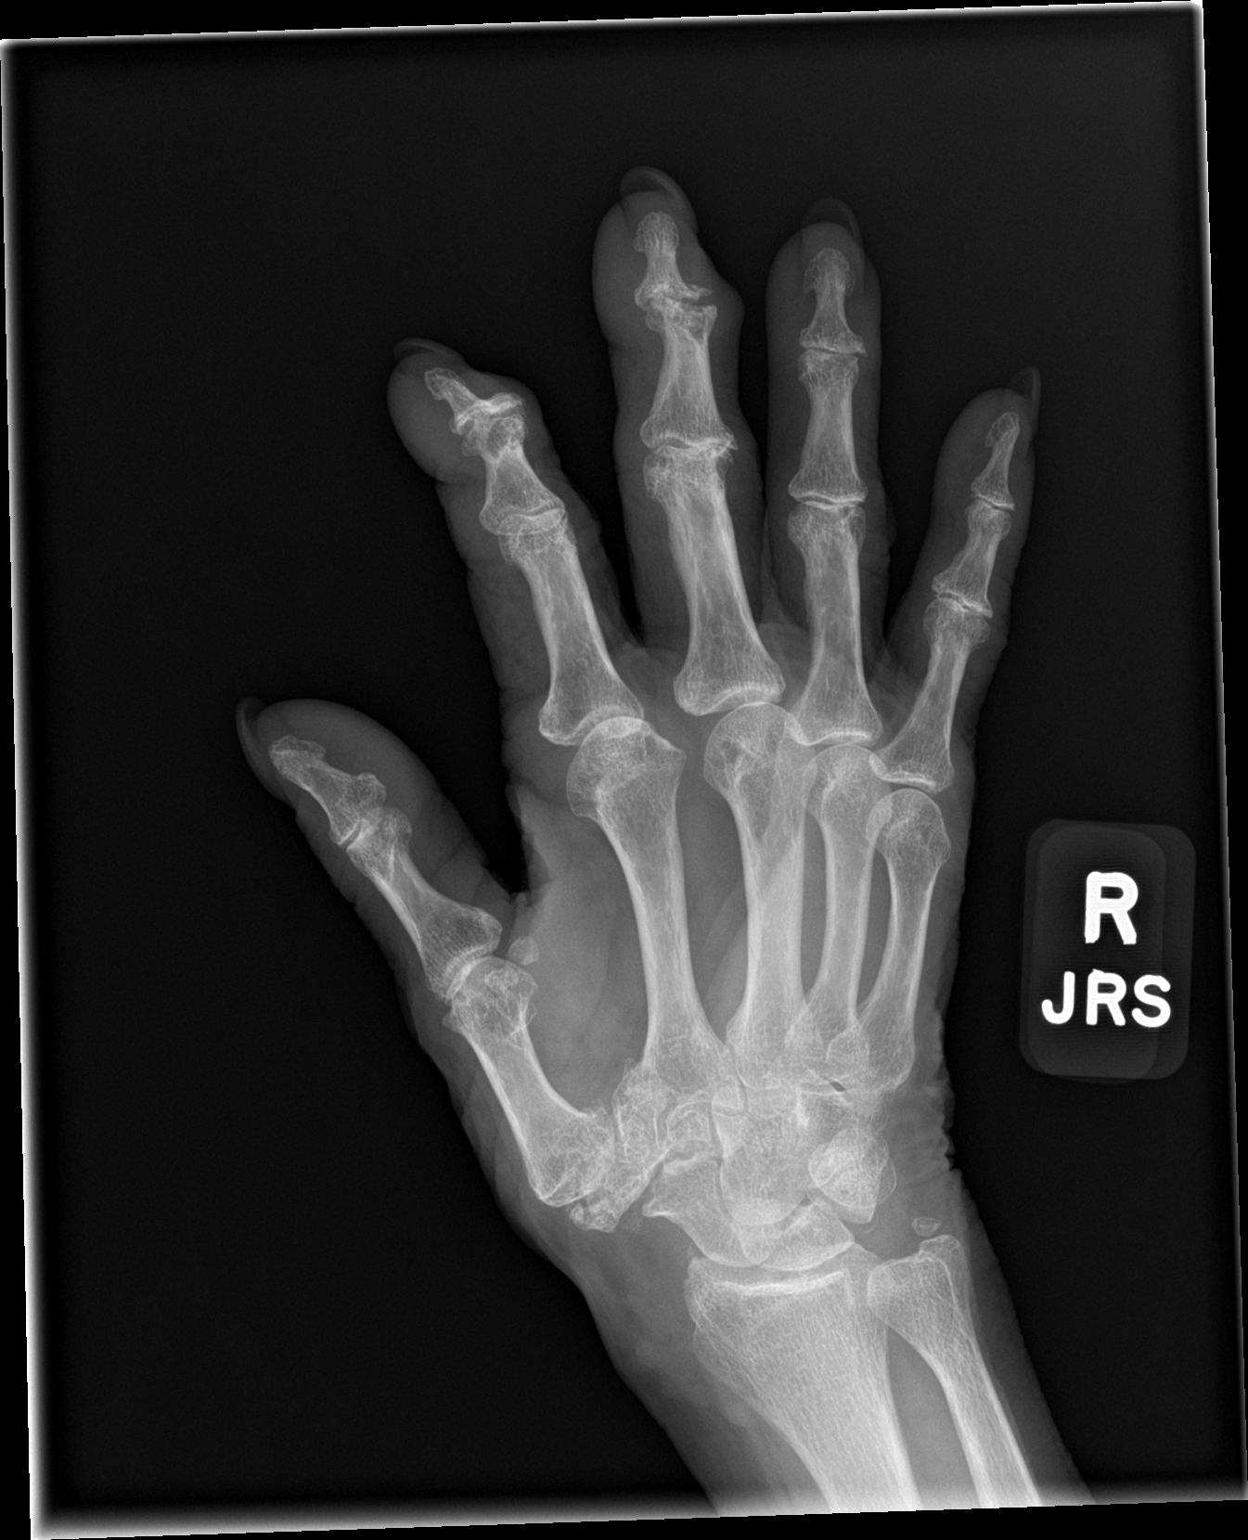

[hand lat]
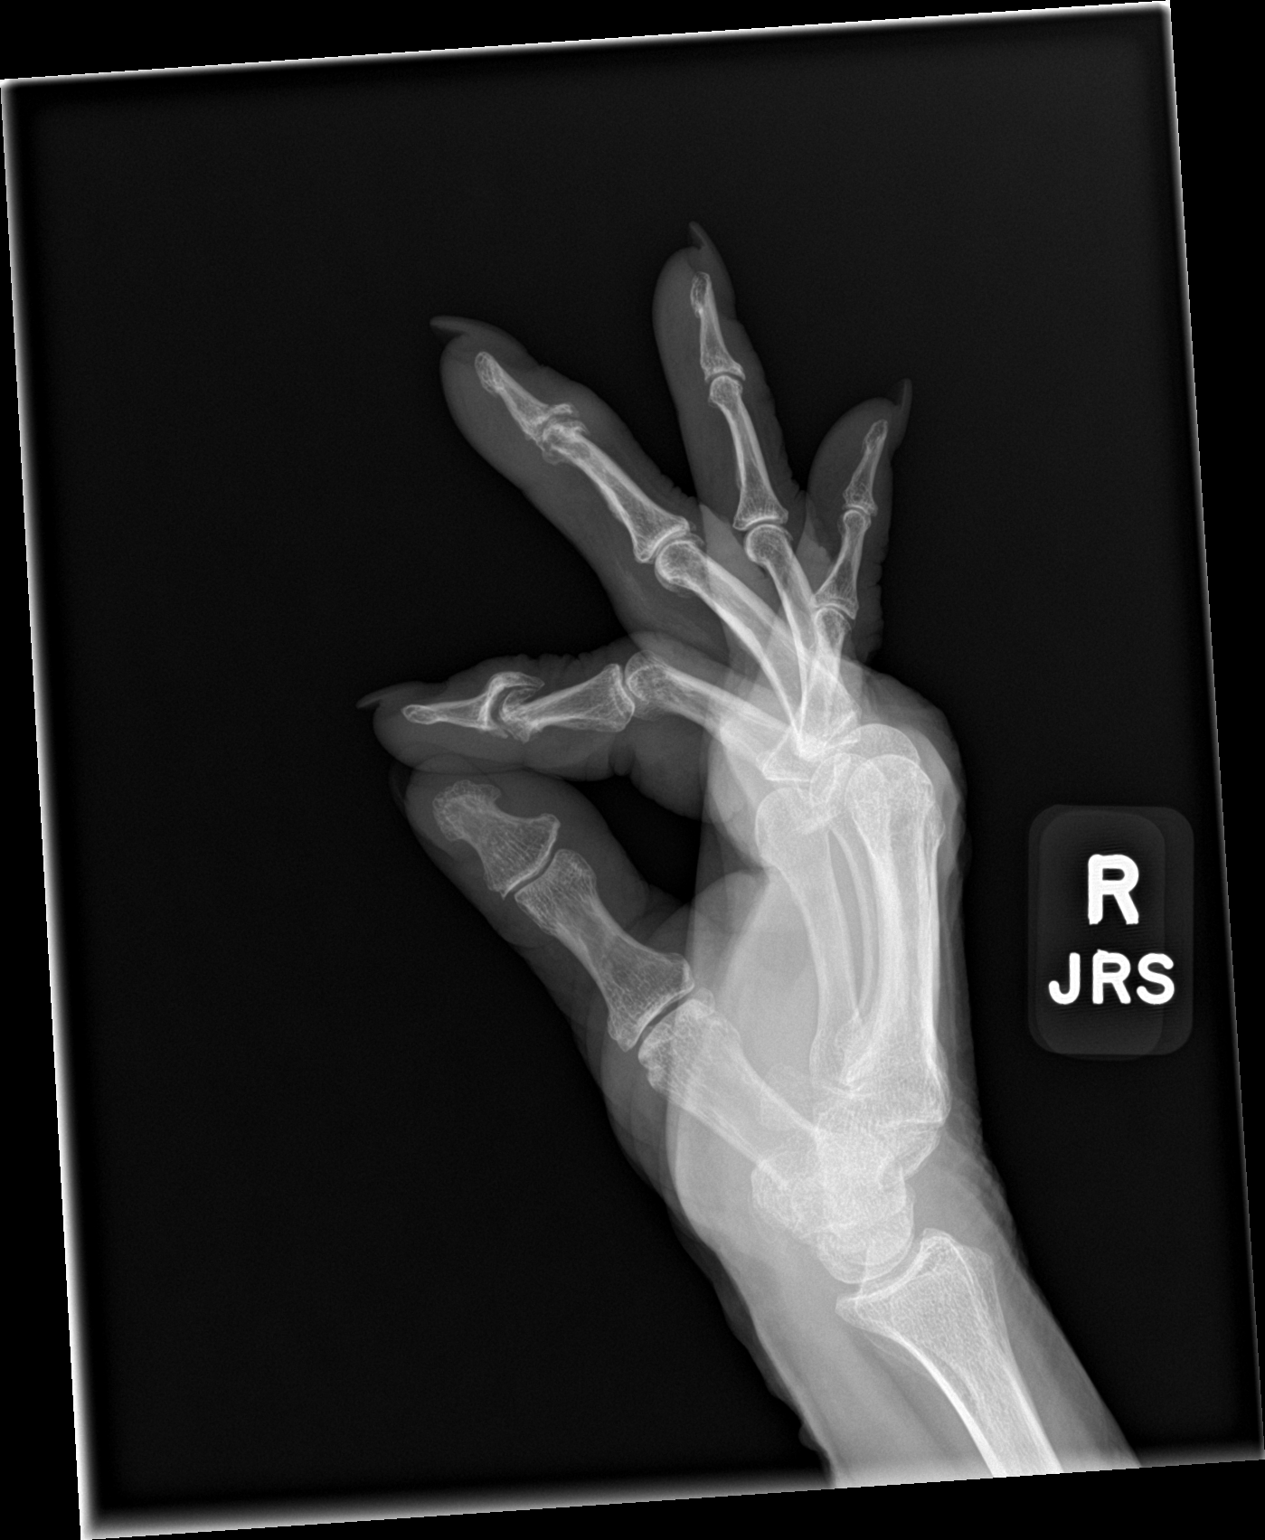

[3 of 3 positions shown; findings below may reference images not displayed]

FINDINGS: There is no evidence of acute fracture. There is severe base of
thumb osteoarthritis including the triscaphe joint and first
carpometacarpal joint. There is severe osteoarthritis of the index
and middle finger distal interphalangeal joints with bulky
osteophytes and central erosive change. Mild-to-moderate
osteoarthritis of the thumb and index finger MCP, and the second
through fifth proximal interphalangeal joints. Old ulnar styloid
injury.
IMPRESSION: No evidence of acute fracture.

Severe osteoarthritis of the base of thumb and distal
interphalangeal joints of the index and middle fingers with features
of erosive OA.

## 2022-03-27 IMAGING — CT CT MAXILLOFACIAL W/O CM
3 series · 14 of 47 positions shown, 16 images · non-contrast
Comparison: 01/23/2018

CLINICAL DATA: Status post fall. Complains of posterior neck pain,
headache and swelling and bruising to right cheek.

EXAM:
CT HEAD WITHOUT CONTRAST
CT MAXILLOFACIAL WITHOUT CONTRAST
CT CERVICAL SPINE WITHOUT CONTRAST
TECHNIQUE: Multidetector CT imaging of the head, cervical spine, and
maxillofacial structures were performed using the standard protocol
without intravenous contrast. Multiplanar CT image reconstructions
of the cervical spine and maxillofacial structures were also
generated.

[Series 3: max soft · axial · 0.31mm/px · z∈[-212,-72]mm · 8 of 82 slices shown, 10 images]
[im 6/82  brain]
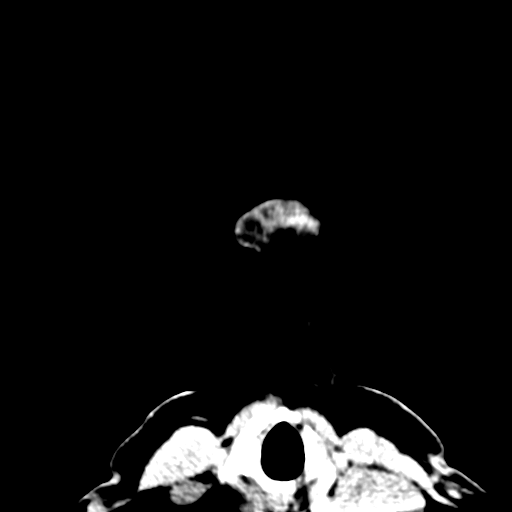
[im 6/82  bone]
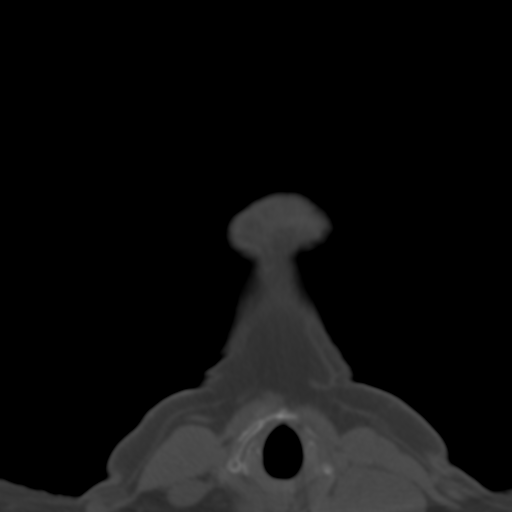
[im 17/82  bone]
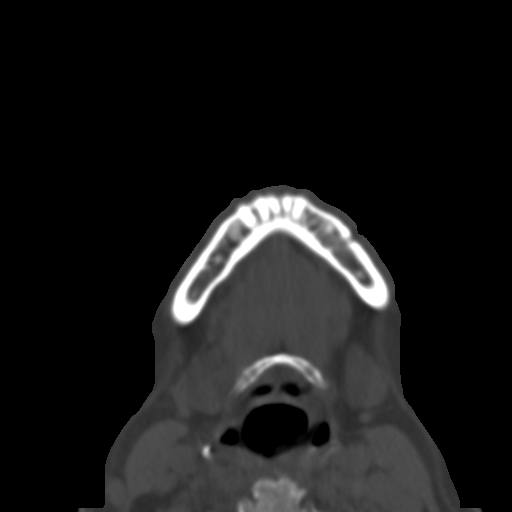
[im 26/82  bone]
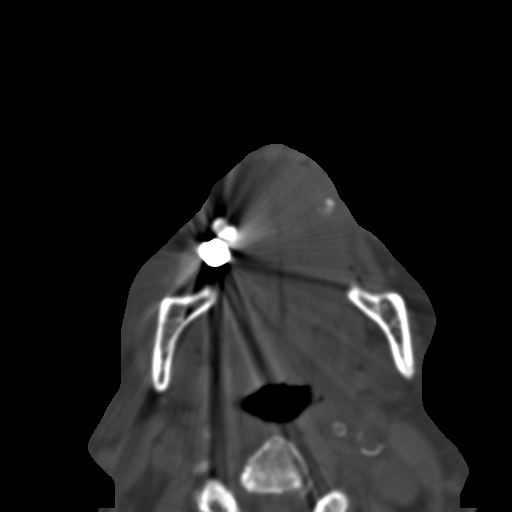
[im 37/82  bone]
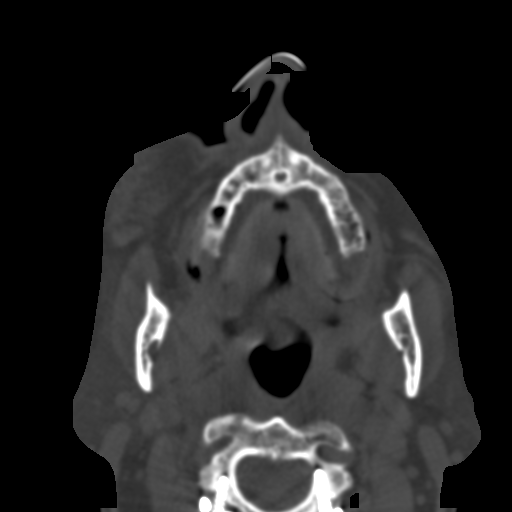
[im 45/82  brain]
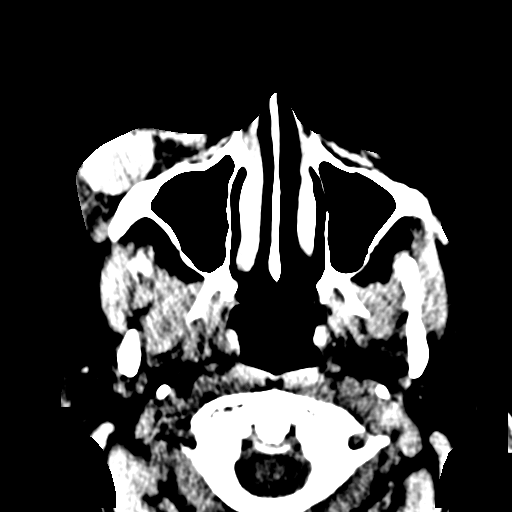
[im 45/82  bone]
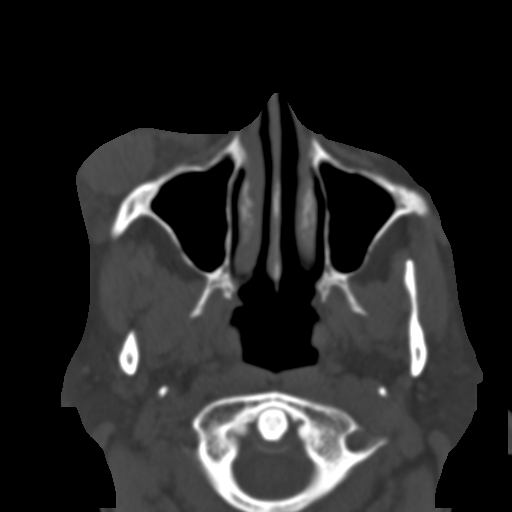
[im 56/82  bone]
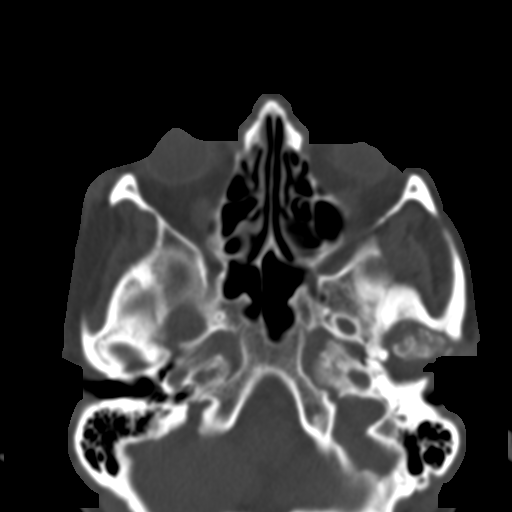
[im 65/82  bone]
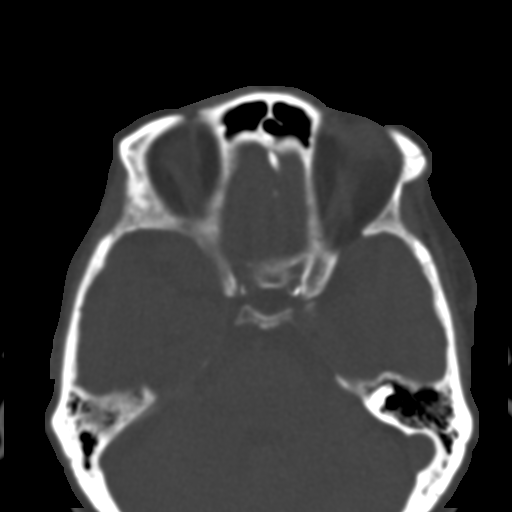
[im 76/82  bone]
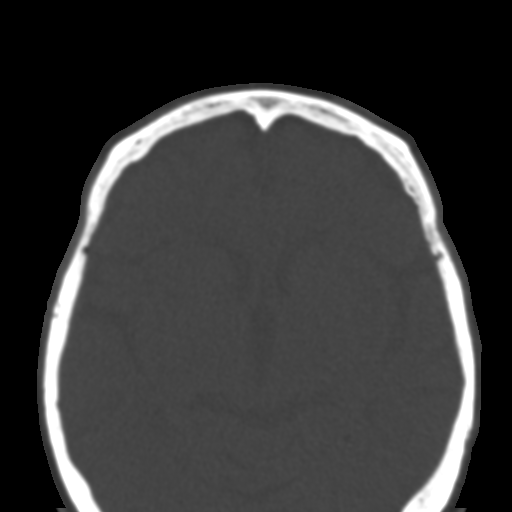

[Series 7: coronal soft · coronal · 0.34mm/px · 3 of 63 slices shown]
[im 21/63  bone]
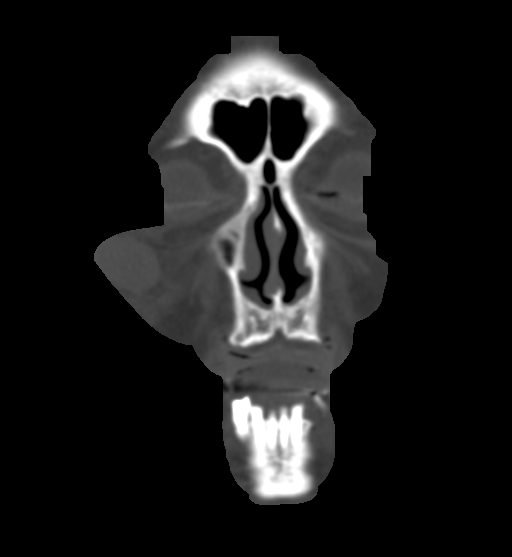
[im 28/63  bone]
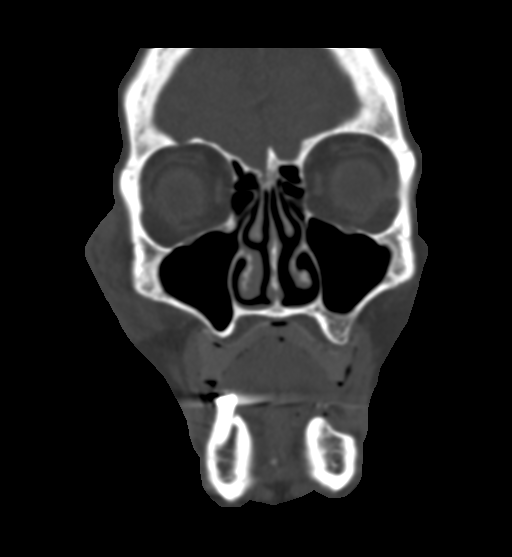
[im 35/63  bone]
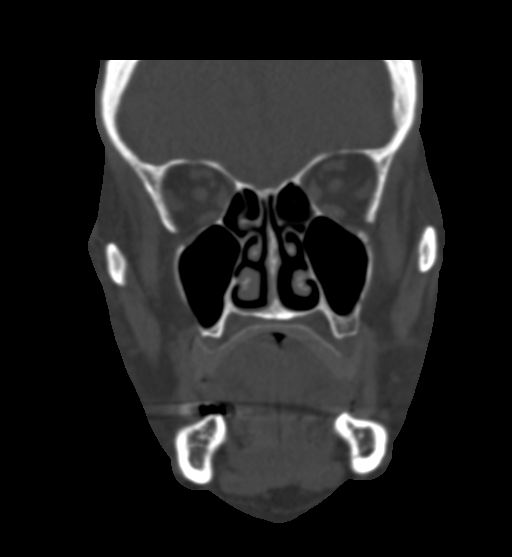

[Series 8: sagittal soft · sagittal · 0.25mm/px · 3 of 87 slices shown]
[im 29/87  bone]
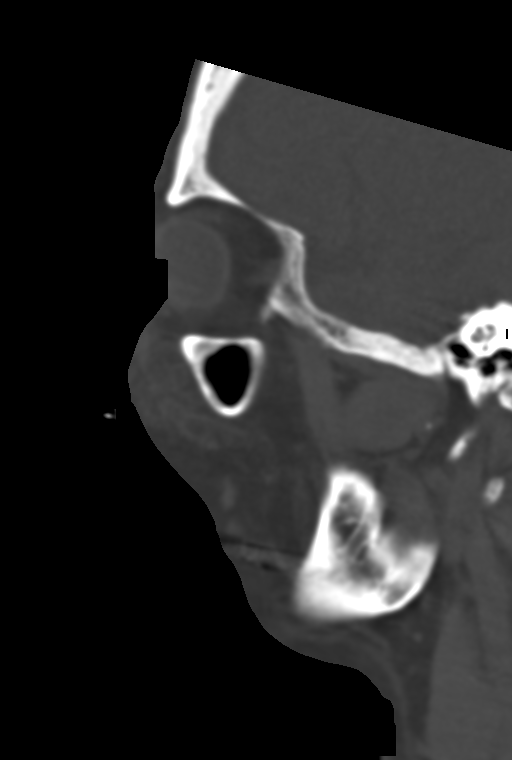
[im 44/87  bone]
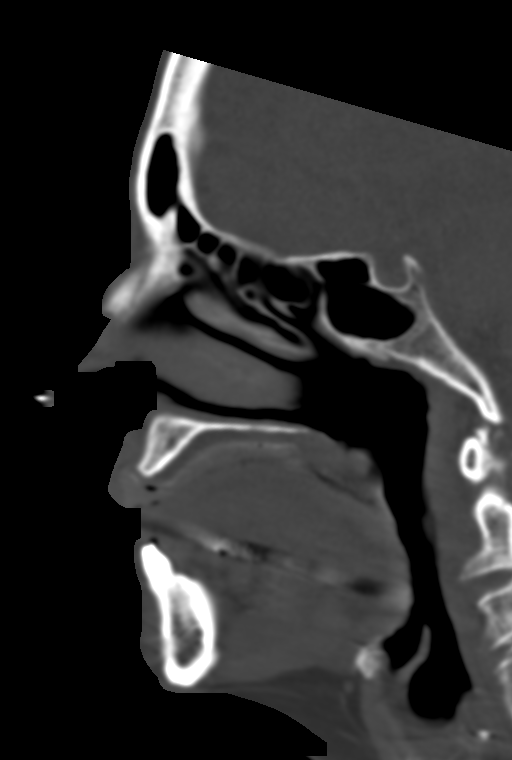
[im 58/87  bone]
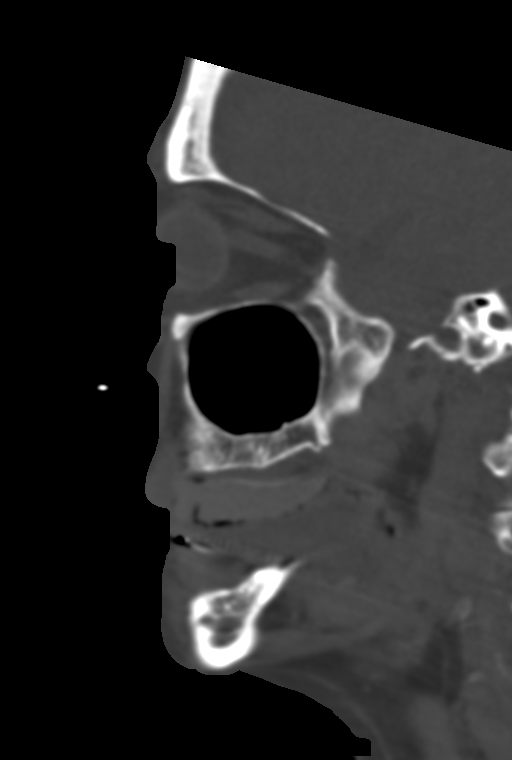

[14 of 47 positions shown; findings below may reference images not displayed]

FINDINGS: CT HEAD FINDINGS

Brain: No evidence of acute infarction, hemorrhage, hydrocephalus,
extra-axial collection or mass lesion/mass effect. There is mild
diffuse low-attenuation within the subcortical and periventricular
white matter compatible with chronic microvascular disease. Remote,
small bilateral basal ganglia lacunar infarcts identified.
Prominence of sulci and ventricles compatible with brain atrophy.

Vascular: No hyperdense vessel or unexpected calcification.

Skull: Normal. Negative for fracture or focal lesion.

Other: None.

CT MAXILLOFACIAL FINDINGS

Osseous: No fracture or mandibular dislocation. No destructive
process. Chronic degenerative change is noted involving the left
temporomandibular joint which appears similar to 01/23/2018.

Orbits: Negative. No traumatic or inflammatory finding.

Sinuses: The paranasal sinuses are clear.

Soft tissues: There is a large right facial hematoma just below the
right orbit measuring 2.6 x 1.9 cm. No underlying fracture
identified.

CT CERVICAL SPINE FINDINGS

Alignment: Normal.

Skull base and vertebrae: Status post posterior hardware fixation of
C2 through C4. Anterior screw and plate device is noted extending
from C6 through T1. Posterior hardware fixation has also been
performed at C6 through T1. Solid fusion of the C4 through T1
vertebra noted. No signs of acute fracture.

Soft tissues and spinal canal: No prevertebral fluid or swelling. No
visible canal hematoma.

Disc levels: There is solid fusion of the disc spaces at C4 through
T1.

Upper chest: Negative.

Other: None
IMPRESSION: 1. No acute intracranial abnormalities.
2. Chronic small vessel ischemic disease and brain atrophy.
3. Large right facial hematoma.
4. No evidence for facial bone fracture.
5. No evidence for cervical spine fracture or dislocation.
6. Extensive cervical fusion.

## 2022-04-02 ENCOUNTER — Telehealth: Payer: Self-pay | Admitting: Internal Medicine

## 2022-04-02 NOTE — Telephone Encounter (Signed)
Pt stated she has a yeast infection from taking the antibiotic levofloxacin for her uti. She would like to know if an rx can be called in for her yeast infection.    Please advise.

## 2022-04-03 ENCOUNTER — Other Ambulatory Visit: Payer: Self-pay | Admitting: Internal Medicine

## 2022-04-03 MED ORDER — FLUCONAZOLE 150 MG PO TABS: 150.0000 mg | ORAL_TABLET | Freq: Once | ORAL | 2 refills | Status: AC

## 2022-04-10 ENCOUNTER — Ambulatory Visit: Payer: PPO | Admitting: Pulmonary Disease

## 2022-04-10 ENCOUNTER — Encounter: Payer: Self-pay | Admitting: Pulmonary Disease

## 2022-04-10 VITALS — BP 124/62 | HR 71 | Temp 98.4°F | Ht 59.0 in | Wt 120.4 lb

## 2022-04-10 DIAGNOSIS — R918 Other nonspecific abnormal finding of lung field: Secondary | ICD-10-CM

## 2022-04-10 NOTE — Progress Notes (Unsigned)
Subjective:   PATIENT ID: [name] [name] GENDER: female DOB: 1941/07/15, MRN: [id number]   HPI  Chief Complaint  Patient presents with   Consult    Patient is here today due to recent imaging performed.  Pt states she does get a tickle in her throat which will make her need to cough a couple times. States she does have some discomfort in chest from recent pna.    Reason for Visit: New consult for lung mass  [name] is an 81 year old female never smoker with CAD s/p DES x 4 (last 2021), HTN, DM2, GERD, hx blood clots related to trauma s/p excision who presents for new consult. Daughter presents and provides additional history.  She recently was seen by her PCP for fever and fatigue with no improvement on Z-pack. Dx with UTI however noted to have abnormal CXR leading to evaluation with CT Chest with demonstrated nodular density measuring 19 x 14 mm in the left apex concerning for malignancy. No mediastinal or hilar adenopathy.  At baseline she sometimes ambulate with a cane. Currently living independently and perform ADLs but is moving to a senior assistance for cleaning and meal assistance. Daughter reports she is sometimes a fall risk.  She walks up stairs slowly and will get winded. Denies cough, wheezing, hemoptysis, unintentional weight loss, night sweats or unexplained fevers/chills.  Social History: Never smoker Significant second smoke exposure Mother with pancreatic cancer Brother with lung cancer, heavy smoker Brother with laryngeal cancer, lived near [place]  Environmental exposures: Significant mold in apartment  I have personally reviewed patient's past medical/family/social history, allergies, current medications.  Past Medical History:  Diagnosis Date   Allergy    Anal fissure    Anxiety    CAD (coronary artery disease)    s/p inf MI 2007 - tx w/ DES to RCA // s/p POBA to [facility] and DES to [place] in 11/18 ([facility] in [place], [place]) // Myoview 3/21:  low risk // s/p DES to pRCA   Cataract    removed both eyes   Cerebrovascular disease    Carotid US 09/2017 [facility]): mild plaque (<50%) in both carotid arteries   Chronic neck pain    Chronic pain syndrome    Diabetes Mellitus, Type 2    Diverticular disease    DJD (degenerative joint disease)    Echocardiogram 10/2018    Echo 10/2018: EF 55-60, normal RVSF, mod MAC, mod TR, severe AoV calcification and sclerosis with nodular calcium/mobile area of calcium in the LVOT (small veg vs Lambl's excrescence  - consider TEE), mild AI, mild AS (mean 11).    Echocardiogram 04/2019    Echocardiogram 04/2019: EF 60-65, basal septal hypertrophy, grade 2 diastolic dysfunction, normal wall motion, normal RV SF, mild LAE, mod MAC, trivial MR, mod sclerosis of the aortic valve with mod aortic annular calcification, thin mobile filamentous structure on ventricular side of AV likely representing Lambl's excrescence, mild AI, mild TR   Frequent UTI's    Gastroesophageal reflux disease    H/O bacterial endocarditis    Rx w IV Abxs in 2020 ([facility] in [place], [place]) // F/u echo with AoV Lambl's excrescence   H/O hiatal hernia    HTN (hypertension)    Hx of fall 10/2020   Hx of MI 2007   Hx of Stroke    IBS (irritable bowel syndrome)    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 (Cervical Fusion)   Premature ventricular contractions    Tubular adenoma of colon    Ulcer    [facility] [place]   Urinary incontinence      Family History  Problem Relation Age of Onset   Heart disease Mother    Pancreatic cancer Mother    Lung cancer Brother    Cancer - Other Brother        vocal cord cancer   Colon polyps Sister     Coronary artery disease Other        sibling   Coronary artery disease Other        sibling   Colon cancer Neg Hx    Esophageal cancer Neg Hx    Rectal cancer Neg Hx    Stomach cancer Neg Hx      Social History   Occupational History    Employer: RETIRED  Tobacco Use   Smoking status: Never    Passive exposure: Never   Smokeless tobacco: Never  Vaping Use   Vaping Use: Never used  Substance and Sexual Activity   Alcohol use: No   Drug use: No   Sexual activity: Yes    Partners: Male    Allergies  Allergen Reactions   Nitrofurantoin Nausea Only    Severe nausea   Niacin And Related Other (See Comments)    Must take "Flush-free"   Ceftriaxone Itching   Codeine Nausea Only   Prednisone Itching and Rash   Sulfonamide Derivatives Itching   Tetracycline Itching and Rash     Outpatient Medications Prior to Visit  Medication Sig Dispense Refill   amLODipine (NORVASC) 5 MG tablet Take 1 tablet (5 mg total) by mouth daily. 90 tablet 3   Ascorbic Acid (VITAMIN C) 500 MG CAPS Take 500 mg by mouth in the morning and at bedtime.     aspirin EC 81 MG tablet Take 81 mg by mouth every morning.      atorvastatin (LIPITOR) 80 MG tablet TAKE ONE TABLET BY MOUTH EVERYDAY AT BEDTIME 90 tablet 1   celecoxib (CELEBREX) 50 MG capsule TAKE ONE CAPSULE BY MOUTH twice A DAY 180 capsule 0   Cinnamon 500 MG TABS Take 1,000 mg by mouth daily.     clopidogrel (PLAVIX) 75 MG tablet TAKE ONE TABLET BY MOUTH EVERY MORNING 90 tablet 1   dicyclomine (BENTYL) 10 MG capsule TAKE ONE CAPSULE BY MOUTH FOUR TIMES DAILY 360 capsule 0   ezetimibe (ZETIA) 10 MG tablet TAKE ONE TABLET BY MOUTH EVERY MORNING 90 tablet 1   gabapentin (NEURONTIN) 300 MG capsule TAKE TWO CAPSULES BY MOUTH EVERY MORNING and TAKE TWO CAPSULES BY MOUTH EVERYDAY AT BEDTIME 360 capsule 1   Krill Oil 1000 MG CAPS Take 1,000 mg by mouth daily.     losartan (COZAAR) 25 MG tablet Take 1 tablet (25 mg total) by mouth daily. 90 tablet 3    magnesium oxide (MAG-OX) 400 MG tablet Take 400 mg by mouth daily.     metoprolol succinate (TOPROL-XL) 50 MG 24 hr tablet Take 1.5 tablets (75 mg total) by mouth every morning. 135 tablet 3   Misc Natural Products (FOCUSED MIND PO) Take 15  mg by mouth daily.     nitroGLYCERIN (NITROSTAT) 0.4 MG SL tablet DISSOLVE 1 TABLET UNDER THE TONGUE EVERY 5 MINUTES AS NEEDED FOR CHEST PAIN. DO NOT EXCEED A TOTAL OF 3 DOSES IN 15 MINUTES. 25 tablet 2   omega-3 acid ethyl esters (LOVAZA) 1 g capsule Take 1 g by mouth 2 (two) times daily.     pantoprazole (PROTONIX) 40 MG tablet Take 1 tablet (40 mg total) by mouth daily. 90 tablet 1   Psyllium (DAILY FIBER PO) Take 1 capsule by mouth in the morning and at bedtime.     saccharomyces boulardii (FLORASTOR) 250 MG capsule Take 250 mg by mouth 2 (two) times daily.     TURMERIC PO Take 1,000 mg by mouth in the morning and at bedtime.     venlafaxine XR (EFFEXOR-XR) 37.5 MG 24 hr capsule TAKE ONE TABLET BY MOUTH EVERY MORNING 90 capsule 1   vitamin E 1000 UNIT capsule Take 1,000 Units by mouth in the morning and at bedtime.     No facility-administered medications prior to visit.    Review of Systems  Constitutional:  Positive for fever and malaise/fatigue. Negative for chills, diaphoresis and weight loss.  HENT:  Negative for congestion.   Respiratory:  Positive for shortness of breath. Negative for cough, hemoptysis, sputum production and wheezing.   Cardiovascular:  Positive for chest pain. Negative for palpitations and leg swelling.  Gastrointestinal:  Positive for heartburn.  Musculoskeletal:  Positive for joint pain.  Neurological:  Positive for headaches.     Objective:   Vitals:   04/10/22 0902  BP: 124/62  Pulse: 71  Temp: 98.4 F (36.9 C)  TempSrc: Oral  SpO2: 96%  Weight: 120 lb 6.4 oz (54.6 kg)  Height: _0  (1.499 m)   SpO2: 96 % (RA) O2 Device: None (Room air)  Physical Exam: General: Elderly, frail-appearing, no acute  distress HENT: [place], AT Eyes: EOMI, no scleral icterus Respiratory: Clear to auscultation bilaterally.  No crackles, wheezing or rales Cardiovascular: RRR, -M/R/G, no JVD Extremities:-Edema,-tenderness Neuro: AAO x4, CNII-XII grossly intact Psych: Normal mood, normal affect  Data Reviewed:  Imaging: CT Chest 03/22/22 - nodular density measuring 19 x 14 mm in the left apex. No hilar or mediastinal adenopathy  PFT: None on file  Labs: CBC    Component Value Date/Time   WBC 6.9 03/05/2022 0841   RBC 4.07 03/05/2022 0841   HGB 12.9 03/05/2022 0841   HGB 14.3 12/21/2021 1538   HCT 38.6 03/05/2022 0841   HCT 42.7 12/21/2021 1538   PLT 282.0 03/05/2022 0841   PLT 239 12/21/2021 1538   MCV 94.9 03/05/2022 0841   MCV 94 12/21/2021 1538   MCH 31.6 12/21/2021 1538   MCH 31.0 06/29/2021 0102   MCHC 33.3 03/05/2022 0841   RDW 14.1 03/05/2022 0841   RDW 12.3 12/21/2021 1538   LYMPHSABS 2.1 03/05/2022 0841   MONOABS 0.7 03/05/2022 0841   EOSABS 0.2 03/05/2022 0841   BASOSABS 0.1 03/05/2022 0841   Absolute 03/05/22 - 200     Assessment & Plan:   Discussion: 81 year old female never smoker with CAD s/p DES x 4 (last 2021), HTN, DM2, GERD, hx blood clots related to trauma s/p excision who presents as new patient. Reviewed history as noted above. Discussed CT guided biopsy vs navigational bronchoscopy. We discussed extensively goals of care and if patient wished to pursue treatment including chemotherapy and radiation if this was found to be malignant. Patient expresses  she is unsure if she would be able to tolerate management. After further discussion with plan on PET/CT for evaluation and if alternative biopsy sites available.  Left upper lobe lung mass --ORDER PET/CT  Health Maintenance Immunization History  Administered Date(s) Administered   Fluad Quad(high Dose 65+) 04/30/2019, 06/27/2020, 05/12/2021   Influenza Split 07/18/2011, 05/19/2012   Influenza Whole 06/22/2009,  05/15/2010   Influenza, High Dose Seasonal PF 07/20/2015, 05/24/2016, 06/18/2018   Influenza,inj,Quad PF,6+ Mos 06/15/2013, 05/21/2014   Influenza-Unspecified 06/24/2017   Pneumococcal Conjugate-13 01/20/2014   Pneumococcal Polysaccharide-23 11/08/2014, 11/13/2017   Tdap 01/20/2014   Zoster, Live 07/08/2014   CT Lung Screen - not qualified due to age  Orders Placed This Encounter  Procedures   NM PET Image Initial (PI) Skull Base To Thigh    To be completed as soon as possible. Patient will need to have a f/u appt scheduled with Dr. Loanne Drilling 2-3 days after the PET to have results discussed.    Standing Status:   Future    Standing Expiration Date:   04/11/2023    Order Specific Question:   If indicated for the ordered procedure, I authorize the administration of a radiopharmaceutical per Radiology protocol    Answer:   Yes    Order Specific Question:   Preferred imaging location?    Answer:   [facility]  No orders of the defined types were placed in this encounter.   No follow-ups on file. After PET/CT  I have spent a total time of 45-minutes on the day of the appointment reviewing prior documentation, coordinating care and discussing medical diagnosis and plan with the patient/family. Imaging, labs and tests included in this note have been reviewed and interpreted independently by me.  [place], MD [facility] 04/12/2022  Office Number [phone]

## 2022-04-10 NOTE — Patient Instructions (Signed)
Left upper lobe lung mass --ORDER PET/CT  Follow-up with me in 3 weeks

## 2022-04-20 ENCOUNTER — Encounter (HOSPITAL_COMMUNITY)
Admission: RE | Admit: 2022-04-20 | Discharge: 2022-04-20 | Disposition: A | Discharge status: Home / Self Care | Payer: PPO | Source: Ambulatory Visit | Attending: Pulmonary Disease | Admitting: Pulmonary Disease

## 2022-04-20 DIAGNOSIS — R911 Solitary pulmonary nodule: Secondary | ICD-10-CM | POA: Diagnosis not present

## 2022-04-20 DIAGNOSIS — R918 Other nonspecific abnormal finding of lung field: Secondary | ICD-10-CM | POA: Insufficient documentation

## 2022-04-20 LAB — GLUCOSE, CAPILLARY: Glucose-Capillary: 146 mg/dL — ABNORMAL HIGH (ref 70–99)

## 2022-04-20 MED ORDER — FLUDEOXYGLUCOSE F - 18 (FDG) INJECTION
6.0000 | Freq: Once | INTRAVENOUS | Status: AC
  Administered 2022-04-20: 6 via INTRAVENOUS

## 2022-04-30 ENCOUNTER — Encounter: Payer: Self-pay | Admitting: Pulmonary Disease

## 2022-04-30 ENCOUNTER — Other Ambulatory Visit (HOSPITAL_COMMUNITY): Payer: Self-pay | Admitting: Pulmonary Disease

## 2022-04-30 ENCOUNTER — Ambulatory Visit: Payer: PPO | Admitting: Pulmonary Disease

## 2022-04-30 ENCOUNTER — Other Ambulatory Visit: Payer: Self-pay | Admitting: Pulmonary Disease

## 2022-04-30 VITALS — BP 122/64 | HR 66 | Ht 59.0 in | Wt 122.2 lb

## 2022-04-30 DIAGNOSIS — R918 Other nonspecific abnormal finding of lung field: Secondary | ICD-10-CM | POA: Diagnosis not present

## 2022-04-30 NOTE — Patient Instructions (Addendum)
Left upper lobe lung mass --ARRANGE for navigational bronchoscopy. Will discuss with available intervention pulmonologist --Please call us if you have not been contacted by the end of the week

## 2022-04-30 NOTE — Progress Notes (Signed)
Subjective:   PATIENT ID: [name] [name] GENDER: female DOB: 18-May-1941, MRN: [id number]   HPI  Chief Complaint  Patient presents with   Follow-up    PET results    Reason for Visit: Follow-up   [name] [name] is an 81 year old female never smoker with CAD s/p DES x 4 (last 2021), HTN, DM2, GERD, hx blood clots related to trauma s/p excision who presents for follow-up.  Initial consult She recently was seen by her PCP for fever and fatigue with no improvement on Z-pack. Dx with UTI however noted to have abnormal CXR leading to evaluation with CT Chest with demonstrated nodular density measuring 19 x 14 mm in the left apex concerning for malignancy. No mediastinal or hilar adenopathy.  At baseline she sometimes ambulate with a cane. Currently living independently and perform ADLs but is moving to a senior assistance for cleaning and meal assistance. Daughter reports she is sometimes a fall risk.  She walks up stairs slowly and will get winded. Denies cough, wheezing, hemoptysis, unintentional weight loss, night sweats or unexplained fevers/chills.  04/30/22 Daughter present to provide additional history. Reports intermittent low-grade fevers and fatigue. Denies significant weight loss or appetite change. Denies night sweats.  Social History: Never smoker Significant second smoke exposure Mother with pancreatic cancer Brother with lung cancer, heavy smoker Brother with laryngeal cancer, lived near [place]  Environmental exposures: Significant mold in apartment  Past Medical History:  Diagnosis Date   Allergy    Anal fissure    Anxiety    CAD (coronary artery disease)    s/p inf MI 2007 - tx w/ DES to RCA // s/p POBA to [facility] and DES to [place] in 11/18 ([facility] in [place], [place]) // Myoview 3/21: low risk // s/p DES to pRCA   Cataract    removed both eyes   Cerebrovascular disease    Carotid US 09/2017 [facility]): mild plaque (<50%) in both carotid arteries   Chronic  neck pain    Chronic pain syndrome    Diabetes Mellitus, Type 2    Diverticular disease    DJD (degenerative joint disease)    Echocardiogram 10/2018    Echo 10/2018: EF 55-60, normal RVSF, mod MAC, mod TR, severe AoV calcification and sclerosis with nodular calcium/mobile area of calcium in the LVOT (small veg vs Lambl's excrescence  - consider TEE), mild AI, mild AS (mean 11).    Echocardiogram 04/2019    Echocardiogram 04/2019: EF 60-65, basal septal hypertrophy, grade 2 diastolic dysfunction, normal wall motion, normal RV SF, mild LAE, mod MAC, trivial MR, mod sclerosis of the aortic valve with mod aortic annular calcification, thin mobile filamentous structure on ventricular side of AV likely representing Lambl's excrescence, mild AI, mild TR   Frequent UTI's    Gastroesophageal reflux disease    H/O bacterial endocarditis    Rx w IV Abxs in 2020 ([facility] in [place], [place]) // F/u echo with AoV Lambl's excrescence   H/O hiatal hernia    HTN (hypertension)    Hx of fall 10/2020   Hx of MI 2007   Hx of Stroke    IBS (irritable bowel syndrome)    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 (Cervical Fusion)   Premature ventricular contractions    Tubular adenoma of colon    Ulcer    sam [facility]   Urinary incontinence      Family History  Problem Relation Age of Onset   Heart disease Mother    Pancreatic cancer Mother    Lung cancer Brother    Cancer - Other Brother        vocal cord cancer   Colon polyps Sister    Coronary artery disease Other        sibling   Coronary artery disease Other        sibling   Colon cancer Neg Hx    Esophageal cancer Neg Hx    Rectal cancer Neg Hx     Stomach cancer Neg Hx      Social History   Occupational History    Employer: RETIRED  Tobacco Use   Smoking status: Never    Passive exposure: Never   Smokeless tobacco: Never  Vaping Use   Vaping Use: Never used  Substance and Sexual Activity   Alcohol use: No   Drug use: No   Sexual activity: Yes    Partners: Male    Allergies  Allergen Reactions   Nitrofurantoin Nausea Only    Severe nausea   Niacin And Related Other (See Comments)    Must take "Flush-free"   Ceftriaxone Itching   Codeine Nausea Only   Prednisone Itching and Rash   Sulfonamide Derivatives Itching   Tetracycline Itching and Rash     Outpatient Medications Prior to Visit  Medication Sig Dispense Refill   amLODipine (NORVASC) 5 MG tablet Take 1 tablet (5 mg total) by mouth daily. 90 tablet 3   Ascorbic Acid (VITAMIN C) 500 MG CAPS Take 500 mg by mouth in the morning and at bedtime.     aspirin EC 81 MG tablet Take 81 mg by mouth every morning.      atorvastatin (LIPITOR) 80 MG tablet TAKE ONE TABLET BY MOUTH EVERYDAY AT BEDTIME 90 tablet 1   celecoxib (CELEBREX) 50 MG capsule TAKE ONE CAPSULE BY MOUTH twice A DAY 180 capsule 0   Cinnamon 500 MG TABS Take 1,000 mg by mouth daily.     clopidogrel (PLAVIX) 75 MG tablet TAKE ONE TABLET BY MOUTH EVERY MORNING 90 tablet 1   dicyclomine (BENTYL) 10 MG capsule TAKE ONE CAPSULE BY MOUTH FOUR TIMES DAILY 360 capsule 0   ezetimibe (ZETIA) 10 MG tablet TAKE ONE TABLET BY MOUTH EVERY MORNING 90 tablet 1   gabapentin (NEURONTIN) 300 MG capsule TAKE TWO CAPSULES BY MOUTH EVERY MORNING and TAKE TWO CAPSULES BY MOUTH EVERYDAY AT BEDTIME 360 capsule 1   Krill Oil 1000 MG CAPS Take 1,000 mg by mouth daily.     losartan (COZAAR) 25 MG tablet Take 1 tablet (25 mg total) by mouth daily. 90 tablet 3   magnesium oxide (MAG-OX) 400 MG tablet Take 400 mg by mouth daily.     metoprolol succinate (TOPROL-XL) 50 MG 24 hr tablet Take 1.5 tablets (75 mg total) by mouth every  morning. 135 tablet 3   Misc Natural Products (FOCUSED MIND PO) Take 15 mg by mouth daily.     nitroGLYCERIN (NITROSTAT) 0.4 MG SL tablet DISSOLVE 1 TABLET UNDER THE TONGUE EVERY 5 MINUTES AS NEEDED FOR CHEST PAIN. DO NOT EXCEED A TOTAL OF 3  DOSES IN 15 MINUTES. 25 tablet 2   omega-3 acid ethyl esters (LOVAZA) 1 g capsule Take 1 g by mouth 2 (two) times daily.     pantoprazole (PROTONIX) 40 MG tablet Take 1 tablet (40 mg total) by mouth daily. 90 tablet 1   Psyllium (DAILY FIBER PO) Take 1 capsule by mouth in the morning and at bedtime.     saccharomyces boulardii (FLORASTOR) 250 MG capsule Take 250 mg by mouth 2 (two) times daily.     TURMERIC PO Take 1,000 mg by mouth in the morning and at bedtime.     venlafaxine XR (EFFEXOR-XR) 37.5 MG 24 hr capsule TAKE ONE TABLET BY MOUTH EVERY MORNING 90 capsule 1   vitamin E 1000 UNIT capsule Take 1,000 Units by mouth in the morning and at bedtime.     No facility-administered medications prior to visit.    Review of Systems  Constitutional:  Positive for fever and malaise/fatigue. Negative for chills, diaphoresis and weight loss.  HENT:  Negative for congestion.   Respiratory:  Positive for shortness of breath. Negative for cough, hemoptysis, sputum production and wheezing.   Cardiovascular:  Negative for chest pain, palpitations and leg swelling.     Objective:   Vitals:   04/30/22 1338  BP: 122/64  Pulse: 66  SpO2: 93%  Weight: 122 lb 3.2 oz (55.4 kg)  Height: _0  (1.499 m)   SpO2: 93 % O2 Device: None (Room air)  Physical Exam: General: Elderly, frail-appearing, no acute distress HENT: [place], AT Eyes: EOMI, no scleral icterus Respiratory: Clear to auscultation bilaterally.  No crackles, wheezing or rales Cardiovascular: RRR, -M/R/G, no JVD Extremities:-Edema,-tenderness Neuro: AAO x4, CNII-XII grossly intact Psych: Normal mood, normal affect  Data Reviewed:  Imaging: CT Chest 03/22/22 - nodular density measuring 19 x 14 mm  in the left apex. No hilar or mediastinal adenopathy PET 2/42/68 - Hypermetabolic 1.8 cm LUL apical lung nodule. No adenopathy or mets.  PFT: None on file  Labs: CBC    Component Value Date/Time   WBC 6.9 03/05/2022 0841   RBC 4.07 03/05/2022 0841   HGB 12.9 03/05/2022 0841   HGB 14.3 12/21/2021 1538   HCT 38.6 03/05/2022 0841   HCT 42.7 12/21/2021 1538   PLT 282.0 03/05/2022 0841   PLT 239 12/21/2021 1538   MCV 94.9 03/05/2022 0841   MCV 94 12/21/2021 1538   MCH 31.6 12/21/2021 1538   MCH 31.0 06/29/2021 0102   MCHC 33.3 03/05/2022 0841   RDW 14.1 03/05/2022 0841   RDW 12.3 12/21/2021 1538   LYMPHSABS 2.1 03/05/2022 0841   MONOABS 0.7 03/05/2022 0841   EOSABS 0.2 03/05/2022 0841   BASOSABS 0.1 03/05/2022 0841   Absolute 03/05/22 - 200     Assessment & Plan:   Discussion: 81 year old female never smoker with CAD s/p DES x4 (last 2021), HTN, DM2, GERD and hx blood clots related to trauma s/p excision who presents for follow-up. PET CT reviewed with hypermetabolic 1.8 cm LUL apical lung nodule.  Based on imaging, lung mass is highly concerning for malignancy. We discussed diagnostic testing including CT-guided biopsy, bronchoscopy and surgery vs conservative management with further imaging. After addressing patient/family's questions, patient wishes to pursue diagnostic testing via bronchoscopy. We discussed risks and benefits of procedure including infection, bleeding and lung collapse. Patient consented to procedure. I discussed case with Dr. Verlee Monte and she is a candidate for navigational bronchoscopy. Coordinated procedure with RN and OR scheduler.  Left upper lobe lung mass --ARRANGE for navigational bronchoscopy. Will discuss with available intervention pulmonologist --Contact Radiology for Super D  Health Maintenance Immunization History  Administered Date(s) Administered   Fluad Quad(high Dose 65+) 04/30/2019, 06/27/2020, 05/12/2021   Influenza Split 07/18/2011,  05/19/2012   Influenza Whole 06/22/2009, 05/15/2010   Influenza, High Dose Seasonal PF 07/20/2015, 05/24/2016, 06/18/2018   Influenza,inj,Quad PF,6+ Mos 06/15/2013, 05/21/2014   Influenza-Unspecified 06/24/2017   Pneumococcal Conjugate-13 01/20/2014   Pneumococcal Polysaccharide-23 11/08/2014, 11/13/2017   Tdap 01/20/2014   Zoster, Live 07/08/2014   CT Lung Screen - not qualified due to age  No orders of the defined types were placed in this encounter. No orders of the defined types were placed in this encounter.   No follow-ups on file.   I have spent a total time of 50-minutes on the day of the appointment including chart review, data review, collecting history, coordinating care and discussing medical diagnosis and plan with the patient/family. Past medical history, allergies, medications were reviewed. Pertinent imaging, labs and tests included in this note have been reviewed and interpreted independently by me.  [place], MD [facility] 04/30/2022  Office Number [phone]

## 2022-04-30 NOTE — H&P (View-Only) (Signed)
Subjective:   PATIENT ID: [name] [name] GENDER: female DOB: 11/10/1940, MRN: [id number]   HPI  Chief Complaint  Patient presents with   Follow-up    PET results    Reason for Visit: Follow-up   [name] is an 81 year old female never smoker with CAD s/p DES x 4 (last 2021), HTN, DM2, GERD, hx blood clots related to trauma s/p excision who presents for follow-up.  Initial consult She recently was seen by her PCP for fever and fatigue with no improvement on Z-pack. Dx with UTI however noted to have abnormal CXR leading to evaluation with CT Chest with demonstrated nodular density measuring 19 x 14 mm in the left apex concerning for malignancy. No mediastinal or hilar adenopathy.  At baseline she sometimes ambulate with a cane. Currently living independently and perform ADLs but is moving to a senior assistance for cleaning and meal assistance. Daughter reports she is sometimes a fall risk.  She walks up stairs slowly and will get winded. Denies cough, wheezing, hemoptysis, unintentional weight loss, night sweats or unexplained fevers/chills.  04/30/22 Daughter present to provide additional history. Reports intermittent low-grade fevers and fatigue. Denies significant weight loss or appetite change. Denies night sweats.  Social History: Never smoker Significant second smoke exposure Mother with pancreatic cancer Brother with lung cancer, heavy smoker Brother with laryngeal cancer, lived near [place]  Environmental exposures: Significant mold in apartment  Past Medical History:  Diagnosis Date   Allergy    Anal fissure    Anxiety    CAD (coronary artery disease)    s/p inf MI 2007 - tx w/ DES to RCA // s/p POBA to [facility] and DES to [place] in 11/18 ([facility] in [place], [place]) // Myoview 3/21: low risk // s/p DES to pRCA   Cataract    removed both eyes   Cerebrovascular disease    Carotid US 09/2017 [facility]): mild plaque (<50%) in both carotid arteries   Chronic  neck pain    Chronic pain syndrome    Diabetes Mellitus, Type 2    Diverticular disease    DJD (degenerative joint disease)    Echocardiogram 10/2018    Echo 10/2018: EF 55-60, normal RVSF, mod MAC, mod TR, severe AoV calcification and sclerosis with nodular calcium/mobile area of calcium in the LVOT (small veg vs Lambl's excrescence  - consider TEE), mild AI, mild AS (mean 11).    Echocardiogram 04/2019    Echocardiogram 04/2019: EF 60-65, basal septal hypertrophy, grade 2 diastolic dysfunction, normal wall motion, normal RV SF, mild LAE, mod MAC, trivial MR, mod sclerosis of the aortic valve with mod aortic annular calcification, thin mobile filamentous structure on ventricular side of AV likely representing Lambl's excrescence, mild AI, mild TR   Frequent UTI's    Gastroesophageal reflux disease    H/O bacterial endocarditis    Rx w IV Abxs in 2020 ([facility] in [place], [place]) // F/u echo with AoV Lambl's excrescence   H/O hiatal hernia    HTN (hypertension)    Hx of fall 10/2020   Hx of MI 2007   Hx of Stroke    IBS (irritable bowel syndrome)    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 (Cervical Fusion)   Premature ventricular contractions    Tubular adenoma of colon    Ulcer    sam [place] [facility]   Urinary incontinence      Family History  Problem Relation Age of Onset   Heart disease Mother    Pancreatic cancer Mother    Lung cancer Brother    Cancer - Other Brother        vocal cord cancer   Colon polyps Sister    Coronary artery disease Other        sibling   Coronary artery disease Other        sibling   Colon cancer Neg Hx    Esophageal cancer Neg Hx    Rectal cancer Neg Hx     Stomach cancer Neg Hx      Social History   Occupational History    Employer: RETIRED  Tobacco Use   Smoking status: Never    Passive exposure: Never   Smokeless tobacco: Never  Vaping Use   Vaping Use: Never used  Substance and Sexual Activity   Alcohol use: No   Drug use: No   Sexual activity: Yes    Partners: Male    Allergies  Allergen Reactions   Nitrofurantoin Nausea Only    Severe nausea   Niacin And Related Other (See Comments)    Must take "Flush-free"   Ceftriaxone Itching   Codeine Nausea Only   Prednisone Itching and Rash   Sulfonamide Derivatives Itching   Tetracycline Itching and Rash     Outpatient Medications Prior to Visit  Medication Sig Dispense Refill   amLODipine (NORVASC) 5 MG tablet Take 1 tablet (5 mg total) by mouth daily. 90 tablet 3   Ascorbic Acid (VITAMIN C) 500 MG CAPS Take 500 mg by mouth in the morning and at bedtime.     aspirin EC 81 MG tablet Take 81 mg by mouth every morning.      atorvastatin (LIPITOR) 80 MG tablet TAKE ONE TABLET BY MOUTH EVERYDAY AT BEDTIME 90 tablet 1   celecoxib (CELEBREX) 50 MG capsule TAKE ONE CAPSULE BY MOUTH twice A DAY 180 capsule 0   Cinnamon 500 MG TABS Take 1,000 mg by mouth daily.     clopidogrel (PLAVIX) 75 MG tablet TAKE ONE TABLET BY MOUTH EVERY MORNING 90 tablet 1   dicyclomine (BENTYL) 10 MG capsule TAKE ONE CAPSULE BY MOUTH FOUR TIMES DAILY 360 capsule 0   ezetimibe (ZETIA) 10 MG tablet TAKE ONE TABLET BY MOUTH EVERY MORNING 90 tablet 1   gabapentin (NEURONTIN) 300 MG capsule TAKE TWO CAPSULES BY MOUTH EVERY MORNING and TAKE TWO CAPSULES BY MOUTH EVERYDAY AT BEDTIME 360 capsule 1   Krill Oil 1000 MG CAPS Take 1,000 mg by mouth daily.     losartan (COZAAR) 25 MG tablet Take 1 tablet (25 mg total) by mouth daily. 90 tablet 3   magnesium oxide (MAG-OX) 400 MG tablet Take 400 mg by mouth daily.     metoprolol succinate (TOPROL-XL) 50 MG 24 hr tablet Take 1.5 tablets (75 mg total) by mouth every  morning. 135 tablet 3   Misc Natural Products (FOCUSED MIND PO) Take 15 mg by mouth daily.     nitroGLYCERIN (NITROSTAT) 0.4 MG SL tablet DISSOLVE 1 TABLET UNDER THE TONGUE EVERY 5 MINUTES AS NEEDED FOR CHEST PAIN. DO NOT EXCEED A TOTAL OF 3  DOSES IN 15 MINUTES. 25 tablet 2   omega-3 acid ethyl esters (LOVAZA) 1 g capsule Take 1 g by mouth 2 (two) times daily.     pantoprazole (PROTONIX) 40 MG tablet Take 1 tablet (40 mg total) by mouth daily. 90 tablet 1   Psyllium (DAILY FIBER PO) Take 1 capsule by mouth in the morning and at bedtime.     saccharomyces boulardii (FLORASTOR) 250 MG capsule Take 250 mg by mouth 2 (two) times daily.     TURMERIC PO Take 1,000 mg by mouth in the morning and at bedtime.     venlafaxine XR (EFFEXOR-XR) 37.5 MG 24 hr capsule TAKE ONE TABLET BY MOUTH EVERY MORNING 90 capsule 1   vitamin E 1000 UNIT capsule Take 1,000 Units by mouth in the morning and at bedtime.     No facility-administered medications prior to visit.    Review of Systems  Constitutional:  Positive for fever and malaise/fatigue. Negative for chills, diaphoresis and weight loss.  HENT:  Negative for congestion.   Respiratory:  Positive for shortness of breath. Negative for cough, hemoptysis, sputum production and wheezing.   Cardiovascular:  Negative for chest pain, palpitations and leg swelling.     Objective:   Vitals:   04/30/22 1338  BP: 122/64  Pulse: 66  SpO2: 93%  Weight: 122 lb 3.2 oz (55.4 kg)  Height: _0  (1.499 m)   SpO2: 93 % O2 Device: None (Room air)  Physical Exam: General: Elderly, frail-appearing, no acute distress HENT: [place], AT Eyes: EOMI, no scleral icterus Respiratory: Clear to auscultation bilaterally.  No crackles, wheezing or rales Cardiovascular: RRR, -M/R/G, no JVD Extremities:-Edema,-tenderness Neuro: AAO x4, CNII-XII grossly intact Psych: Normal mood, normal affect  Data Reviewed:  Imaging: CT Chest 03/22/22 - nodular density measuring 19 x 14 mm  in the left apex. No hilar or mediastinal adenopathy PET 8/75/64 - Hypermetabolic 1.8 cm LUL apical lung nodule. No adenopathy or mets.  PFT: None on file  Labs: CBC    Component Value Date/Time   WBC 6.9 03/05/2022 0841   RBC 4.07 03/05/2022 0841   HGB 12.9 03/05/2022 0841   HGB 14.3 12/21/2021 1538   HCT 38.6 03/05/2022 0841   HCT 42.7 12/21/2021 1538   PLT 282.0 03/05/2022 0841   PLT 239 12/21/2021 1538   MCV 94.9 03/05/2022 0841   MCV 94 12/21/2021 1538   MCH 31.6 12/21/2021 1538   MCH 31.0 06/29/2021 0102   MCHC 33.3 03/05/2022 0841   RDW 14.1 03/05/2022 0841   RDW 12.3 12/21/2021 1538   LYMPHSABS 2.1 03/05/2022 0841   MONOABS 0.7 03/05/2022 0841   EOSABS 0.2 03/05/2022 0841   BASOSABS 0.1 03/05/2022 0841   Absolute 03/05/22 - 200     Assessment & Plan:   Discussion: 81 year old female never smoker with CAD s/p DES x4 (last 2021), HTN, DM2, GERD and hx blood clots related to trauma s/p excision who presents for follow-up. PET CT reviewed with hypermetabolic 1.8 cm LUL apical lung nodule.  Based on imaging, lung mass is highly concerning for malignancy. We discussed diagnostic testing including CT-guided biopsy, bronchoscopy and surgery vs conservative management with further imaging. After addressing patient/family's questions, patient wishes to pursue diagnostic testing via bronchoscopy. We discussed risks and benefits of procedure including infection, bleeding and lung collapse. Patient consented to procedure. I discussed case with Dr. Verlee Monte and she is a candidate for navigational bronchoscopy. Coordinated procedure with RN and OR scheduler.  Left upper lobe lung mass --ARRANGE for navigational bronchoscopy. Will discuss with available intervention pulmonologist --Contact Radiology for Super D  Health Maintenance Immunization History  Administered Date(s) Administered   Fluad Quad(high Dose 65+) 04/30/2019, 06/27/2020, 05/12/2021   Influenza Split 07/18/2011,  05/19/2012   Influenza Whole 06/22/2009, 05/15/2010   Influenza, High Dose Seasonal PF 07/20/2015, 05/24/2016, 06/18/2018   Influenza,inj,Quad PF,6+ Mos 06/15/2013, 05/21/2014   Influenza-Unspecified 06/24/2017   Pneumococcal Conjugate-13 01/20/2014   Pneumococcal Polysaccharide-23 11/08/2014, 11/13/2017   Tdap 01/20/2014   Zoster, Live 07/08/2014   CT Lung Screen - not qualified due to age  No orders of the defined types were placed in this encounter. No orders of the defined types were placed in this encounter.   No follow-ups on file.   I have spent a total time of 50-minutes on the day of the appointment including chart review, data review, collecting history, coordinating care and discussing medical diagnosis and plan with the patient/family. Past medical history, allergies, medications were reviewed. Pertinent imaging, labs and tests included in this note have been reviewed and interpreted independently by me.  [place], MD [facility] 04/30/2022  Office Number [phone]

## 2022-05-01 ENCOUNTER — Telehealth: Payer: Self-pay | Admitting: Student

## 2022-05-01 DIAGNOSIS — R918 Other nonspecific abnormal finding of lung field: Secondary | ICD-10-CM

## 2022-05-01 NOTE — Telephone Encounter (Signed)
Called and discussed plan for robotic bronchoscopy - reviewed risks of respiratory failure (5-10% for her), pneumothorax (~1%) at our institution, major bleeding - probably greater than 09/998 for her. She agrees to proceed. Asks to schedule after her vacation, gets back 05/12/22.  [place]

## 2022-05-02 ENCOUNTER — Encounter: Payer: Self-pay | Admitting: Student

## 2022-05-02 NOTE — Telephone Encounter (Signed)
Pt has been scheduled for 9/14 at 9:15 at [facility]. She will get covid test in our office on 9/11.  I spoke to pt & gave her appt info & sent letter thru [facility].  Nothing further needed.

## 2022-05-05 ENCOUNTER — Encounter: Payer: Self-pay | Admitting: Pulmonary Disease

## 2022-05-08 NOTE — Telephone Encounter (Signed)
Super D CD received from [facility].  CD placed in Dr. Glenetta Hew pod box.

## 2022-05-11 DIAGNOSIS — Z888 Allergy status to other drugs, medicaments and biological substances status: Secondary | ICD-10-CM | POA: Diagnosis not present

## 2022-05-11 DIAGNOSIS — G8911 Acute pain due to trauma: Secondary | ICD-10-CM | POA: Diagnosis not present

## 2022-05-11 DIAGNOSIS — S0083XA Contusion of other part of head, initial encounter: Secondary | ICD-10-CM | POA: Diagnosis not present

## 2022-05-11 DIAGNOSIS — Z7902 Long term (current) use of antithrombotics/antiplatelets: Secondary | ICD-10-CM | POA: Diagnosis not present

## 2022-05-11 DIAGNOSIS — S8002XA Contusion of left knee, initial encounter: Secondary | ICD-10-CM | POA: Diagnosis not present

## 2022-05-11 DIAGNOSIS — S8992XA Unspecified injury of left lower leg, initial encounter: Secondary | ICD-10-CM | POA: Diagnosis not present

## 2022-05-11 DIAGNOSIS — S0990XA Unspecified injury of head, initial encounter: Secondary | ICD-10-CM | POA: Diagnosis not present

## 2022-05-11 DIAGNOSIS — S0033XA Contusion of nose, initial encounter: Secondary | ICD-10-CM | POA: Diagnosis not present

## 2022-05-11 DIAGNOSIS — Z7982 Long term (current) use of aspirin: Secondary | ICD-10-CM | POA: Diagnosis not present

## 2022-05-11 DIAGNOSIS — Z23 Encounter for immunization: Secondary | ICD-10-CM | POA: Diagnosis not present

## 2022-05-11 DIAGNOSIS — Z79899 Other long term (current) drug therapy: Secondary | ICD-10-CM | POA: Diagnosis not present

## 2022-05-11 DIAGNOSIS — Z883 Allergy status to other anti-infective agents status: Secondary | ICD-10-CM | POA: Diagnosis not present

## 2022-05-11 DIAGNOSIS — S199XXA Unspecified injury of neck, initial encounter: Secondary | ICD-10-CM | POA: Diagnosis not present

## 2022-05-11 DIAGNOSIS — Z885 Allergy status to narcotic agent status: Secondary | ICD-10-CM | POA: Diagnosis not present

## 2022-05-14 ENCOUNTER — Telehealth: Payer: Self-pay | Admitting: Student

## 2022-05-14 ENCOUNTER — Other Ambulatory Visit: Payer: PPO

## 2022-05-14 DIAGNOSIS — Z01812 Encounter for preprocedural laboratory examination: Secondary | ICD-10-CM

## 2022-05-14 NOTE — Telephone Encounter (Signed)
Covid test order per lab for procedure.

## 2022-05-14 NOTE — Telephone Encounter (Signed)
Routing to Dr. Verlee Monte and Magda Paganini as an FYI that pt dropped scans off.

## 2022-05-15 DIAGNOSIS — Z01812 Encounter for preprocedural laboratory examination: Secondary | ICD-10-CM | POA: Diagnosis not present

## 2022-05-16 ENCOUNTER — Ambulatory Visit (INDEPENDENT_AMBULATORY_CARE_PROVIDER_SITE_OTHER): Payer: PPO | Admitting: Family Medicine

## 2022-05-16 ENCOUNTER — Encounter (HOSPITAL_COMMUNITY): Payer: Self-pay | Admitting: Student

## 2022-05-16 ENCOUNTER — Encounter: Payer: Self-pay | Admitting: Family Medicine

## 2022-05-16 ENCOUNTER — Other Ambulatory Visit: Payer: Self-pay

## 2022-05-16 VITALS — BP 142/74 | HR 65 | Temp 97.8°F | Ht 59.0 in | Wt 121.0 lb

## 2022-05-16 DIAGNOSIS — S8992XA Unspecified injury of left lower leg, initial encounter: Secondary | ICD-10-CM | POA: Diagnosis not present

## 2022-05-16 DIAGNOSIS — S0083XA Contusion of other part of head, initial encounter: Secondary | ICD-10-CM | POA: Diagnosis not present

## 2022-05-16 DIAGNOSIS — L0291 Cutaneous abscess, unspecified: Secondary | ICD-10-CM

## 2022-05-16 DIAGNOSIS — W19XXXA Unspecified fall, initial encounter: Secondary | ICD-10-CM

## 2022-05-16 DIAGNOSIS — M25469 Effusion, unspecified knee: Secondary | ICD-10-CM

## 2022-05-16 NOTE — Anesthesia Preprocedure Evaluation (Addendum)
Anesthesia Evaluation  Patient identified by MRN, date of birth, ID band Patient awake    Reviewed: Allergy & Precautions, H&P , NPO status , Patient's Chart, lab work & pertinent test results  History of Anesthesia Complications (+) PONV and history of anesthetic complications  Airway Mallampati: II  TM Distance: >3 FB Neck ROM: Full   Comment: Difficluty opening mouth wide and turning head. (Cervical Fusion) Dental no notable dental hx. (+) Edentulous Upper, Missing, Dental Advisory Given,    Pulmonary neg pulmonary ROS,    Pulmonary exam normal breath sounds clear to auscultation       Cardiovascular Exercise Tolerance: Good hypertension, + CAD and + Past MI  negative cardio ROS Normal cardiovascular exam Rhythm:Regular Rate:Normal     Neuro/Psych PSYCHIATRIC DISORDERS Anxiety Depression Carotid US 09/2017 [facility]): mild plaque (<50%) in both carotid arteries CVA negative neurological ROS  negative psych ROS   GI/Hepatic negative GI ROS, Neg liver ROS, GERD  ,  Endo/Other  negative endocrine ROSdiabetes, Type 2  Renal/GU negative Renal ROS  negative genitourinary   Musculoskeletal negative musculoskeletal ROS (+) Arthritis , Osteoarthritis,    Abdominal   Peds negative pediatric ROS (+)  Hematology negative hematology ROS (+)   Anesthesia Other Findings   Reproductive/Obstetrics negative OB ROS                          Anesthesia Physical Anesthesia Plan  ASA: 4  Anesthesia Plan: General   Post-op Pain Management: Minimal or no pain anticipated   Induction: Intravenous  PONV Risk Score and Plan: 3 and Ondansetron, Dexamethasone and Treatment may vary due to age or medical condition  Airway Management Planned: Oral ETT and Video Laryngoscope Planned  Additional Equipment: None  Intra-op Plan:   Post-operative Plan: Extubation in OR  Informed Consent: I have reviewed the  patients History and Physical, chart, labs and discussed the procedure including the risks, benefits and alternatives for the proposed anesthesia with the patient or authorized representative who has indicated his/her understanding and acceptance.       Plan Discussed with: Anesthesiologist  Anesthesia Plan Comments: (PAT note by Karoline Caldwell, PA-C: Follows with cardiology for history of CAD (inferior MI 2007 DES to RCA, 07/2017 POBA to [facility] and DES to dRCA, 03/2020 DES to RCA, 2022 DES to LAD), bacterial endocarditis (2018), HTN, HLD. She was seen 06/2021 for recurrent angina and set up for LHC demonstrating severe prox LAD stenosis and underwent PCI with DES to pLAD. Patent stent noted to RCA, mild to moderate stenosis in non dominant LCx.  Seen for lightheadedness April 2023  - 14 day ZIOwas sent for episodes of SVT, occasional PVC/PAC, brief NSVT. Her metoprolol was increased. Echo 5/15/23normal LVEF 60 to 65%, mild LVH, grade 1 diastolic dysfunction, mildly elevated PASP, mild MR/AI. Carotid duplex5/2023 bilateral 1-39% stenosis. Last seen by Laurann Montana, NP on 02/20/2022.  She continued to have some lightheadedness that was felt to be consistent with orthostasis, encouraged slow position changes, compression socks, adequate hydration.  She was stable from CAD standpoint, no anginal symptoms, no indication for additional ischemic evaluation.  3-monthfollow-up recommended  Recent abnormal CXR led evaluation with CT chest showing nodular density measuring 19 x 14 mm in the left apex concerning for malignancy.  PET/CT showed hypermetabolic 1.8 cm left upper lobe apical lung nodule.  She was seen by pulmonology and bronchoscopy was recommended.  History of anterior/posterior cervical fusion C4-T1.  GlideScope used for  intubation 04/08/2012.  Last dose Plavix 05/12/2022 per patient's daughter.  Diet controlled DM2, last A1c 7.2 on 03/06/2019.  Patient will need day of surgery labs and  evaluation.  EKG 12/21/2021: Sinus rhythm with PACs.  Rate 78.  LAD.  Pulmonary disease pattern.  CT chest 03/22/2022: IMPRESSION: 19 x 14 mm irregular nodular density is noted in left lung apex concerning for malignancy. PET scan is recommended for further evaluation. These results will be called to the ordering clinician or representative by the Radiologist Assistant, and communication documented in the PACS or zVision Dashboard.  Moderate coronary artery calcifications are noted.  Hepatic steatosis.  Aortic Atherosclerosis (ICD10-I70.0).  Echo 01/15/22 1. Left ventricular ejection fraction, by estimation, is 60 to 65%. The  left ventricle has normal function. The left ventricle has no regional  wall motion abnormalities. There is mild concentric left ventricular  hypertrophy. Left ventricular diastolic  parameters are consistent with Grade I diastolic dysfunction (impaired  relaxation). Elevated left ventricular end-diastolic pressure. The average  left ventricular global longitudinal strain is -12.8 %. The global  longitudinal strain is abnormal.  2. Right ventricular systolic function is normal. The right ventricular  size is normal. There is mildly elevated pulmonary artery systolic  pressure.  3. Left atrial size was mildly dilated.  4. The mitral valve is degenerative. Mild mitral valve regurgitation. No  evidence of mitral stenosis.  5. The aortic valve is tricuspid. Aortic valve regurgitation is mild.  Aortic valve sclerosis/calcification is present, without any evidence of  aortic stenosis.  6. Aortic Normal DTA.  7. The inferior vena cava is normal in size with greater than 50%  respiratory variability, suggesting right atrial pressure of 3 mmHg.   Monitor 01/2022 Patch Wear Time: 10 days and 16 hours (2023-04-20T15:22:27-0400 to 2023-05-01T08:10:14-0400)  Patient had a min HR of 53 bpm, max HR of 210 bpm, and avg HR of 74 bpm. Predominant underlying  rhythm was Sinus Rhythm. 1 run of Ventricular Tachycardia occurred lasting 5 beats with a max rate of 167 bpm (avg 147 bpm). 74 Supraventricular Tachycardia  runs occurred, the run with the fastest interval lasting 6 beats with a max rate of 210 bpm, the longest lasting 12 beats with an avg rate of 130 bpm. Isolated SVEs were occasional (3.1%, 35076), SVE Couplets were rare (<1.0%, 1003), and SVE Triplets  were rare (<1.0%, 1163). Isolated VEs were rare (<1.0%, 104), VE Couplets were rare (<1.0%, 3), and no VE Triplets were present.   SUMMARY: findings as outlined above - NSR with an average HR of 74 bpm. No afib or flutter. No bradycardic events. Rare PVC's, occasional PAC's. No sustained arrhythmias.  Carotid duplex 01/2022 Summary:  Right Carotid: Velocities in the right ICA are consistent with a 1-39%  stenosis.   Left Carotid: Velocities in the left ICA are consistent with a 1-39%  stenosis.   Vertebrals:Bilateral vertebral arteries demonstrate antegrade flow.  Subclavians: Normal flow hemodynamics were seen in bilateral subclavian  arteries.   LHC 06/2021 . Mid LAD lesion is 30% stenosed. . Prox Cx to Mid Cx lesion is 30% stenosed. . Mid RCA lesion is 10% stenosed. . Prox LAD to Mid LAD lesion is 70% stenosed. . A drug-eluting stent was successfully placed using a STENT ONYX FRONTIER [id number]. Marland Kitchen Post intervention, there is a 0% residual stenosis. . Patent mid RCA stents  1. Severe proximal LAD stenosis that angiographically appears moderate to severe. RFR assessment with pressure wire of 0.88.  2. Successful PTCA/DES x  1 proximal LAD (RFR guided) 3. Mild to moderate stenosis in the non-dominant mid Circumflex 4. Large dominant RCA with patent mid stents without restenosis 5. Normal LVEDP  Recommendations: Continue DAPT with ASA/Plavix. Continue beta blocker and statin.   )     Anesthesia Quick Evaluation

## 2022-05-16 NOTE — Patient Instructions (Signed)
Please call with any fever, chills, vomiting or worsening knee pain and return for follow up Friday morning with Dr. Ronnald Ramp at 8:30 am.

## 2022-05-16 NOTE — Progress Notes (Unsigned)
Subjective:     Patient ID: [name] [name], female    DOB: 1940/11/09, 81 y.o.   MRN: [id number]  Chief Complaint  Patient presents with   Fall    Thursday night 9/7 had a fall due to tripping on a stool in the middle of the night. Swollen left knee and bruising on left side of face both causing pain.     HPI Patient is in today for fall on 05/11/2022 with injury to the left side of her face and left knee. Left knee swelling, tenderness and pain with walking.   She has been off of Plavix for the past 5 days due to upcoming biopsy of lung nodule tomorrow.   Low grade fever over the past 2 days.  No chills, chest pain, palpitations, shortness of breath, abdominal pain, N/V/D.  Headache resolved with Tylenol.   Denies vision changes, ear pain, difficulty chewing or swallowing.    Health Maintenance Due  Topic Date Due   FOOT EXAM  03/13/2022    Past Medical History:  Diagnosis Date   Allergy    Anal fissure    Anxiety    CAD (coronary artery disease)    s/p inf MI 2007 - tx w/ DES to RCA // s/p POBA to [facility] and DES to [place] in 11/18 ([facility] in [place], [place]) // Myoview 3/21: low risk // s/p DES to pRCA   Cataract    removed both eyes   Cerebrovascular disease    Carotid US 09/2017 [facility]): mild plaque (<50%) in both carotid arteries   Chronic neck pain    Chronic pain syndrome    Diabetes Mellitus, Type 2    Diverticular disease    DJD (degenerative joint disease)    Echocardiogram 10/2018    Echo 10/2018: EF 55-60, normal RVSF, mod MAC, mod TR, severe AoV calcification and sclerosis with nodular calcium/mobile area of calcium in the LVOT (small veg vs Lambl's excrescence  - consider TEE), mild AI, mild AS (mean 11).    Echocardiogram 04/2019    Echocardiogram 04/2019: EF 60-65, basal septal hypertrophy, grade 2 diastolic dysfunction, normal wall motion, normal RV SF, mild LAE, mod MAC, trivial MR, mod sclerosis of the aortic valve with mod aortic annular calcification,  thin mobile filamentous structure on ventricular side of AV likely representing Lambl's excrescence, mild AI, mild TR   Frequent UTI's    Gastroesophageal reflux disease    H/O bacterial endocarditis    Rx w IV Abxs in 2020 ([facility] in [place], [place]) // F/u echo with AoV Lambl's excrescence   H/O hiatal hernia    HTN (hypertension)    Hx of fall 10/2020   Hx of MI 2007   Hx of Stroke    IBS (irritable bowel syndrome)    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 (Cervical Fusion)   Premature ventricular contractions    Tubular adenoma of colon    Ulcer    [facility] [place] [facility]   Urinary incontinence     Past Surgical History:  Procedure Laterality Date   [place]  2007  BRONCHIAL BIOPSY  05/17/2022   Procedure: BRONCHIAL BIOPSIES;  Surgeon: [name], MD;  Location[facility] ENDOSCOPY;  Service: Pulmonary;;   BRONCHIAL BRUSHINGS  05/17/2022   Procedure: BRONCHIAL BRUSHINGS;  Surgeon: [name], MD;  Location: [facility] ENDOSCOPY;  Service: Pulmonary;;   BRONCHIAL NEEDLE ASPIRATION BIOPSY  05/17/2022   Procedure: BRONCHIAL NEEDLE ASPIRATION BIOPSIES;  Surgeon: [name], MD;  Location: [facility] ENDOSCOPY;  Service: Pulmonary;;   CARDIAC CATHETERIZATION  2007   Stents   CERVICAL SPINE SURGERY  07/2005   Dr Consuello Masse   CHOLECYSTECTOMY  2007   COLONOSCOPY     CORONARY PRESSURE WIRE/FFR WITH 3D MAPPING N/A 06/28/2021   Procedure: Coronary Pressure Wire/FFR w/3D Mapping;  Surgeon: Burnell Blanks, MD;  Location: [place] CV LAB;  Service: Cardiovascular;  Laterality: N/A;   CORONARY STENT INTERVENTION N/A 03/09/2020   Procedure: CORONARY STENT INTERVENTION;   Surgeon: Burnell Blanks, MD;  Location: [place] CV LAB;  Service: Cardiovascular;  Laterality: N/A;   CORONARY STENT INTERVENTION N/A 06/28/2021   Procedure: CORONARY STENT INTERVENTION;  Surgeon: Burnell Blanks, MD;  Location: [place] CV LAB;  Service: Cardiovascular;  Laterality: N/A;   DENTAL SURGERY  05/2013   replaced an inplant   EYE SURGERY  2011   Bilteral   FIDUCIAL MARKER PLACEMENT  05/17/2022   Procedure: FIDUCIAL MARKER PLACEMENT;  Surgeon: [name], MD;  Location: [facility] ENDOSCOPY;  Service: Pulmonary;;  LUL   I & D KNEE WITH POLY EXCHANGE  09/25/2011   Procedure: IRRIGATION AND DEBRIDEMENT KNEE WITH POLY EXCHANGE;  Surgeon: Johnny Bridge, MD;  Location: [place];  Service: Orthopedics;  Laterality: Left;   INCISION AND DRAINAGE ABSCESS / HEMATOMA OF BURSA / KNEE / THIGH  09/2011   JOINT REPLACEMENT  2012   left   LEFT HEART CATH AND CORONARY ANGIOGRAPHY N/A 03/09/2020   Procedure: LEFT HEART CATH AND CORONARY ANGIOGRAPHY;  Surgeon: Burnell Blanks, MD;  Location: [place] CV LAB;  Service: Cardiovascular;  Laterality: N/A;   LEFT HEART CATH AND CORONARY ANGIOGRAPHY N/A 06/28/2021   Procedure: LEFT HEART CATH AND CORONARY ANGIOGRAPHY;  Surgeon: Burnell Blanks, MD;  Location: [place] CV LAB;  Service: Cardiovascular;  Laterality: N/A;   left Total Knee Replacement  11/2010   Dr Percell Miller   NECK SURGERY     has had 3 surgeries   POLYPECTOMY     POSTERIOR CERVICAL FUSION/FORAMINOTOMY  04/08/2012   Procedure: POSTERIOR CERVICAL FUSION/FORAMINOTOMY LEVEL 2;  Surgeon: Otilio Connors, MD;  Location: [place] NEURO ORS;  Service: Neurosurgery;  Laterality: Left;  Left Cervical six-seven Foraminotomy, bilateral cervical seven-thoracic one foraminotomy, cervical six-seven, cervical seven-thoracic one fusion with posterior instrumentation   TEMPOROMANDIBULAR JOINT SURGERY     thumb surgery     TONSILLECTOMY     TONSILLECTOMY  1950   TOTAL ABDOMINAL  HYSTERECTOMY     VIDEO BRONCHOSCOPY WITH RADIAL ENDOBRONCHIAL ULTRASOUND  05/17/2022   Procedure: VIDEO BRONCHOSCOPY WITH RADIAL ENDOBRONCHIAL ULTRASOUND;  Surgeon: [name], MD;  Location: [facility] ENDOSCOPY;  Service: Pulmonary;;    Family History  Problem Relation Age of Onset   Heart disease Mother    Pancreatic cancer Mother    Lung cancer Brother    Cancer - Other Brother        vocal cord cancer   Colon polyps Sister    Coronary artery disease Other        sibling   Coronary artery disease  Other        sibling   Colon cancer Neg Hx    Esophageal cancer Neg Hx    Rectal cancer Neg Hx    Stomach cancer Neg Hx     Social History   Socioeconomic History   Marital status: Widowed    Spouse name: dolly Neuman   Number of children: 6   Years of education: Not on file   Highest education level: Not on file  Occupational History    Employer: RETIRED  Tobacco Use   Smoking status: Never    Passive exposure: Never   Smokeless tobacco: Never  Vaping Use   Vaping Use: Never used  Substance and Sexual Activity   Alcohol use: No   Drug use: No   Sexual activity: Yes    Partners: Male  Other Topics Concern   Not on file  Social History Narrative   Pt never knew her father   Daily caffeine use   Social Determinants of Health   Financial Resource Strain: Low Risk  (11/03/2021)   Overall Financial Resource Strain (CARDIA)    Difficulty of Paying Living Expenses: Not hard at all  Food Insecurity: No Food Insecurity (11/03/2021)   Hunger Vital Sign    Worried About Running Out of Food in the Last Year: Never true    [place] in the Last Year: Never true  Transportation Needs: No Transportation Needs (11/03/2021)   PRAPARE - Hydrologist (Medical): No    Lack of Transportation (Non-Medical): No  Physical Activity: Sufficiently Active (11/03/2021)   Exercise Vital Sign    Days of Exercise per Week: 5 days    Minutes of Exercise per  Session: 30 min  Stress: No Stress Concern Present (11/03/2021)   [place]    Feeling of Stress : Not at all  Social Connections: Moderately Integrated (11/03/2021)   Social Connection and Isolation Panel [NHANES]    Frequency of Communication with Friends and Family: More than three times a week    Frequency of Social Gatherings with Friends and Family: More than three times a week    Attends Religious Services: More than 4 times per year    Active Member of [employer] or Organizations: Yes    Attends Archivist Meetings: More than 4 times per year    Marital Status: Widowed  Intimate Partner Violence: Not At Risk (11/03/2021)   Humiliation, Afraid, Rape, and Kick questionnaire    Fear of Current or Ex-Partner: No    Emotionally Abused: No    Physically Abused: No    Sexually Abused: No    Outpatient Medications Prior to Visit  Medication Sig Dispense Refill   acetaminophen (TYLENOL) 325 MG tablet Take 650 mg by mouth every 6 (six) hours as needed for moderate pain.     amLODipine (NORVASC) 5 MG tablet Take 1 tablet (5 mg total) by mouth daily. 90 tablet 3   Ascorbic Acid (VITAMIN C) 500 MG CAPS Take 500 mg by mouth in the morning and at bedtime.     aspirin EC 81 MG tablet Take 81 mg by mouth every morning.      atorvastatin (LIPITOR) 80 MG tablet TAKE ONE TABLET BY MOUTH EVERYDAY AT BEDTIME 90 tablet 1   Cinnamon 500 MG TABS Take 1,000 mg by mouth daily.     clopidogrel (PLAVIX) 75 MG tablet TAKE ONE TABLET BY MOUTH EVERY MORNING  90 tablet 1   ezetimibe (ZETIA) 10 MG tablet TAKE ONE TABLET BY MOUTH EVERY MORNING 90 tablet 1   gabapentin (NEURONTIN) 300 MG capsule TAKE TWO CAPSULES BY MOUTH EVERY MORNING and TAKE TWO CAPSULES BY MOUTH EVERYDAY AT BEDTIME 360 capsule 1   Krill Oil 1000 MG CAPS Take 1,000 mg by mouth daily.     losartan (COZAAR) 25 MG tablet Take 1 tablet (25 mg total) by mouth daily. 90 tablet 3    magnesium oxide (MAG-OX) 400 MG tablet Take 400 mg by mouth daily.     metoprolol succinate (TOPROL-XL) 50 MG 24 hr tablet Take 1.5 tablets (75 mg total) by mouth every morning. 135 tablet 3   Misc Natural Products (FOCUSED MIND PO) Take 15 mg by mouth daily.     nitroGLYCERIN (NITROSTAT) 0.4 MG SL tablet DISSOLVE 1 TABLET UNDER THE TONGUE EVERY 5 MINUTES AS NEEDED FOR CHEST PAIN. DO NOT EXCEED A TOTAL OF 3 DOSES IN 15 MINUTES. 25 tablet 2   Omega-3 Fatty Acids (FISH OIL) 1000 MG CAPS Take 1,000 mg by mouth in the morning and at bedtime.     pantoprazole (PROTONIX) 40 MG tablet Take 1 tablet (40 mg total) by mouth daily. 90 tablet 1   Psyllium (DAILY FIBER PO) Take 1 capsule by mouth daily.     TURMERIC PO Take 1,000 mg by mouth in the morning and at bedtime.     venlafaxine XR (EFFEXOR-XR) 37.5 MG 24 hr capsule TAKE ONE TABLET BY MOUTH EVERY MORNING 90 capsule 1   vitamin E 1000 UNIT capsule Take 1,000 Units by mouth in the morning and at bedtime.     celecoxib (CELEBREX) 50 MG capsule TAKE ONE CAPSULE BY MOUTH twice A DAY 180 capsule 0   dicyclomine (BENTYL) 10 MG capsule TAKE ONE CAPSULE BY MOUTH FOUR TIMES DAILY 360 capsule 0   No facility-administered medications prior to visit.    Allergies  Allergen Reactions   Macrobid [Nitrofurantoin] Nausea Only    Severe nausea   Sulfa Antibiotics Itching   Niacin And Related Other (See Comments)    Must take "Flush-free"   Codeine Nausea Only   Prednisone Itching and Rash   Rocephin [Ceftriaxone] Itching   Sulfonamide Derivatives Itching   Tetracycline Itching and Rash    ROS     Objective:    Physical Exam Constitutional:      General: She is not in acute distress.    Appearance: She is not ill-appearing.  HENT:     Head:     Jaw: No pain on movement.     Comments: Significant hematoma to left side of face with bruising to left lateral and anterior neck.  Eyes:     General: Lids are normal. Vision grossly intact.      Extraocular Movements: Extraocular movements intact.     Conjunctiva/sclera: Conjunctivae normal.     Pupils: Pupils are equal, round, and reactive to light.  Cardiovascular:     Rate and Rhythm: Normal rate and regular rhythm.     Pulses: Normal pulses.  Pulmonary:     Effort: Pulmonary effort is normal.     Breath sounds: Normal breath sounds.  Musculoskeletal:     Cervical back: Neck supple. No tenderness.     Left knee: Swelling present. Decreased range of motion. Tenderness present.     Comments: Left anterior knee with mild erythema, hematoma and a soft, fluctuant mass anterior left knee superior to patella, TTP. Soft calf  muscle with minimal edema compared to right.  LLE is neurovascularly intact.   Skin:    Findings: Bruising present.  Neurological:     Mental Status: She is alert and oriented to person, place, and time.     Cranial Nerves: Cranial nerves 2-12 are intact. No facial asymmetry.     Sensory: Sensation is intact.     Motor: Motor function is intact.     Gait: Gait abnormal.     BP (!) 142/74 (BP Location: Left Arm, Patient Position: Sitting, Cuff Size: Large)   Pulse 65   Temp 97.8 F (36.6 C) (Temporal)   Ht _0  (1.499 m)   Wt 121 lb (54.9 kg)   SpO2 96%   BMI 24.44 kg/m  Wt Readings from Last 3 Encounters:  05/18/22 124 lb 8 oz (56.5 kg)  05/17/22 121 lb (54.9 kg)  05/16/22 121 lb (54.9 kg)     After informed verbal consent was obtained. Using Betadine for cleansing and 2% Lidocaine with epinephrine for anesthetic (2 cc's used), with sterile technique a 5 mm punch incision was made and a cavity of clotted blood was found with no exudate. The cavity was cultured and irrigated with a Qtip. No deep tracking was found but there were loculations of clotted blood that were removed. The cavity was packed with iodoform. Hemostasis was obtained by pressure. A dressing was applied with 4 X 4 gauze and an ACE bandage was applied. The procedure was well  tolerated without complications.   [name], MD     Assessment & Plan:   Problem List Items Addressed This Visit   None Visit Diagnoses     Injury of left knee, initial encounter    -  Primary   Facial hematoma, initial encounter       Fall, initial encounter       Swelling of knee       Relevant Orders   WOUND CULTURE   Abscess       Relevant Orders   WOUND CULTURE      Reviewed ED notes and imaging results including negative CT head, neck, facial bones, and left knee.  Her main concern today is left knee pain and swelling.  No red flag symptoms. She stopped Plavix 5 days ago for upcoming unrelated procedure.  Assisted Dr. Ronnald Ramp with procedure of left knee. Wound culture sent. Antibiotics were not indicated or prescribed today. She will return for follow up and packing removal in 2 days with Dr. Ronnald Ramp and call with any worsening symptoms.     I am having Marylu F. Szczygiel "FAYE" maintain her Vitamin C, aspirin EC, Krill Oil, Cinnamon, TURMERIC PO, magnesium oxide, vitamin E, Psyllium (DAILY FIBER PO), Misc Natural Products (FOCUSED MIND PO), amLODipine, nitroGLYCERIN, metoprolol succinate, losartan, gabapentin, ezetimibe, atorvastatin, clopidogrel, venlafaxine XR, pantoprazole, Fish Oil, and acetaminophen.  No orders of the defined types were placed in this encounter.

## 2022-05-16 NOTE — Pre-Procedure Instructions (Signed)
PCP - Dr. [name] Cardiologist - Dr. Sherren Mocha  PPM/ICD - denies   Chest x-ray - 03/05/22 EKG - 12/21/21 Stress Test - 11/12/19 ECHO - 01/15/22 Cardiac Cath - 06/28/21  CPAP - denies  DM- Type 2 Pt does not check blood sugar and does not take any medications.  Blood Thinner Instructions: Plavix- hold 5 days. LD 9/9  Aspirin Instructions: hold 5 days (Daughter is pretty sure this is how long, but either 3-5 days she has been holding  ERAS Protcol - NPO  COVID TEST- needs DOS  Anesthesia review: yes, cardiac hx  Patient verbally denies any shortness of breath, fever, cough and chest pain during phone call   -------------  SDW INSTRUCTIONS given:  Your procedure is scheduled on 9/14.  Report to [facility] Main Entrance "A" at 06:30 A.M., and check in at the Admitting office.  Call this number if you have problems the morning of surgery:  [phone]   Remember:  Do not eat or drink after midnight the night before your surgery      Take these medicines the morning of surgery with A SIP OF WATER  Tylenol prn Amlodipine dicyclomine (BENTYL) ezetimibe (ZETIA) gabapentin (NEURONTIN)  metoprolol succinate (TOPROL-XL)  nitroGLYCERIN (NITROSTAT) prn pantoprazole (PROTONIX)  venlafaxine XR (EFFEXOR-XR)    As of today, STOP taking any Aspirin (unless otherwise instructed by your surgeon) Aleve, Naproxen, Ibuprofen, Motrin, Advil, Goody's, BC's, all herbal medications, fish oil, and all vitamins.                      Do not wear jewelry, make up, or nail polish            Do not wear lotions, powders, perfumes/colognes, or deodorant.            Do not shave 48 hours prior to surgery.  Men may shave face and neck.            Do not bring valuables to the hospital.            [facility] is not responsible for any belongings or valuables.  Do NOT Smoke (Tobacco/Vaping) 24 hours prior to your procedure If you use a CPAP at night, you may bring all equipment for  your overnight stay.   Contacts, glasses, dentures or bridgework may not be worn into surgery.      For patients admitted to the hospital, discharge time will be determined by your treatment team.   Patients discharged the day of surgery will not be allowed to drive home, and someone needs to stay with them for 24 hours.    Special instructions:   [place]- Preparing For Surgery  Before surgery, you can play an important role. Because skin is not sterile, your skin needs to be as free of germs as possible. You can reduce the number of germs on your skin by washing with CHG (chlorahexidine gluconate) Soap before surgery.  CHG is an antiseptic cleaner which kills germs and bonds with the skin to continue killing germs even after washing.    Oral Hygiene is also important to reduce your risk of infection.  Remember - BRUSH YOUR TEETH THE MORNING OF SURGERY WITH YOUR REGULAR TOOTHPASTE  Please do not use if you have an allergy to CHG or antibacterial soaps. If your skin becomes reddened/irritated stop using the CHG.  Do not shave (including legs and underarms) for at least 48 hours prior to first CHG shower. It  is OK to shave your face.  Please follow these instructions carefully.   Shower the NIGHT BEFORE SURGERY and the MORNING OF SURGERY with DIAL Soap.   Pat yourself dry with a CLEAN TOWEL.  Wear CLEAN PAJAMAS to bed the night before surgery  Place CLEAN SHEETS on your bed the night of your first shower and DO NOT SLEEP WITH PETS.   Day of Surgery: Please shower morning of surgery  Wear Clean/Comfortable clothing the morning of surgery Do not apply any deodorants/lotions.   Remember to brush your teeth WITH YOUR REGULAR TOOTHPASTE.   Questions were answered. Patient verbalized understanding of instructions.

## 2022-05-16 NOTE — Progress Notes (Signed)
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 She was seen 06/2021 for recurrent angina and set up for LHC demonstrating severe prox LAD stenosis and underwent PCI with DES to pLAD. Patent stent noted to RCA, mild to moderate stenosis in non dominant LCx.  Seen for lightheadedness April 2023  - 14 day ZIO was sent for episodes of SVT, occasional PVC/PAC, brief NSVT. Her metoprolol was increased.  Echo 01/15/22 normal LVEF 60 to 65%, mild LVH, grade 1 diastolic dysfunction, mildly elevated PASP, mild MR/AI.  Carotid duplex 01/2022 bilateral 1-39% stenosis. Last seen by Laurann Montana, NP on 02/20/2022.  She continued to have some lightheadedness that was felt to be consistent with orthostasis, encouraged slow position changes, compression socks, adequate hydration.  She was stable from CAD standpoint, no anginal symptoms, no indication for additional ischemic evaluation.  74-monthfollow-up recommended  Recent abnormal CXR led evaluation with CT chest showing nodular density measuring 19 x 14 mm in the left apex concerning for malignancy.  PET/CT showed hypermetabolic 1.8 cm left upper lobe apical lung nodule.  She was seen by pulmonology and bronchoscopy was recommended.  History of anterior/posterior cervical fusion C4-T1.  GlideScope used for intubation 04/08/2012.  Last dose Plavix 05/12/2022 per patient's daughter.  Diet controlled DM2, last A1c 7.2 on 03/06/2019.  Patient will need day of surgery labs and evaluation.  EKG 12/21/2021: Sinus rhythm with PACs.  Rate 78.  LAD.  Pulmonary disease pattern.  CT chest 03/22/2022: IMPRESSION: 19 x 14 mm irregular nodular density is noted in left lung apex concerning for malignancy. PET scan is recommended for further evaluation. These results will be called to the ordering  clinician or representative by the Radiologist Assistant, and communication documented in the PACS or zVision Dashboard.   Moderate coronary artery calcifications are noted.   Hepatic steatosis.   Aortic Atherosclerosis (ICD10-I70.0).  Echo 01/15/22  1. Left ventricular ejection fraction, by estimation, is 60 to 65%. The  left ventricle has normal function. The left ventricle has no regional  wall motion abnormalities. There is mild concentric left ventricular  hypertrophy. Left ventricular diastolic  parameters are consistent with Grade I diastolic dysfunction (impaired  relaxation). Elevated left ventricular end-diastolic pressure. The average  left ventricular global longitudinal strain is -12.8 %. The global  longitudinal strain is abnormal.   2. Right ventricular systolic function is normal. The right ventricular  size is normal. There is mildly elevated pulmonary artery systolic  pressure.   3. Left atrial size was mildly dilated.   4. The mitral valve is degenerative. Mild mitral valve regurgitation. No  evidence of mitral stenosis.   5. The aortic valve is tricuspid. Aortic valve regurgitation is mild.  Aortic valve sclerosis/calcification is present, without any evidence of  aortic stenosis.   6. Aortic Normal DTA.   7. The inferior vena cava is normal in size with greater than 50%  respiratory variability, suggesting right atrial pressure of 3 mmHg.    Monitor 01/2022 Patch Wear Time:  10 days and 16 hours (2023-04-20T15:22:27-0400 to 2023-05-01T08:10:14-0400)   Patient had a min HR of 53 bpm, max HR of 210 bpm, and avg HR of 74 bpm. Predominant underlying rhythm was Sinus Rhythm. 1 run of Ventricular Tachycardia occurred lasting 5 beats with a max rate of 167 bpm (  avg 147 bpm). 74 Supraventricular Tachycardia  runs occurred, the run with the fastest interval lasting 6 beats with a max rate of 210 bpm, the longest lasting 12 beats with an avg rate of 130 bpm. Isolated SVEs  were occasional (3.1%, 35076), SVE Couplets were rare (<1.0%, 1003), and SVE Triplets  were rare (<1.0%, 1163). Isolated VEs were rare (<1.0%, 104), VE Couplets were rare (<1.0%, 3), and no VE Triplets were present.    SUMMARY: findings as outlined above - NSR with an average HR of 74 bpm. No afib or flutter. No bradycardic events. Rare PVC's, occasional PAC's. No sustained arrhythmias.    Carotid duplex 01/2022 Summary:  Right Carotid: Velocities in the right ICA are consistent with a 1-39%  stenosis.   Left Carotid: Velocities in the left ICA are consistent with a 1-39%  stenosis.   Vertebrals: Bilateral vertebral arteries demonstrate antegrade flow.  Subclavians: Normal flow hemodynamics were seen in bilateral subclavian               arteries.    LHC 06/2021   Mid LAD lesion is 30% stenosed.   Prox Cx to Mid Cx lesion is 30% stenosed.   Mid RCA lesion is 10% stenosed.   Prox LAD to Mid LAD lesion is 70% stenosed.   A drug-eluting stent was successfully placed using a STENT ONYX FRONTIER [id number].   Post intervention, there is a 0% residual stenosis.   Patent mid RCA stents   Severe proximal LAD stenosis that angiographically appears moderate to severe. RFR assessment with pressure wire of 0.88.  Successful PTCA/DES x 1 proximal LAD (RFR guided) Mild to moderate stenosis in the non-dominant mid Circumflex Large dominant RCA with patent mid stents without restenosis Normal LVEDP   Recommendations: Continue DAPT with ASA/Plavix. Continue beta blocker and statin.      Wynonia Musty [facility] Short Stay Center/Anesthesiology Phone [phone] 05/16/2022 3:30 PM

## 2022-05-17 ENCOUNTER — Encounter (HOSPITAL_COMMUNITY)
Admission: RE | Disposition: A | Discharge status: Home / Self Care | Payer: Self-pay | Source: Ambulatory Visit | Attending: Student

## 2022-05-17 ENCOUNTER — Ambulatory Visit (HOSPITAL_COMMUNITY): Payer: PPO

## 2022-05-17 ENCOUNTER — Ambulatory Visit (HOSPITAL_COMMUNITY)
Admission: RE | Admit: 2022-05-17 | Discharge: 2022-05-17 | Disposition: A | Discharge status: Home / Self Care | Payer: PPO | Source: Ambulatory Visit | Attending: Student | Admitting: Student

## 2022-05-17 ENCOUNTER — Ambulatory Visit (HOSPITAL_COMMUNITY): Payer: PPO | Admitting: Physician Assistant

## 2022-05-17 ENCOUNTER — Ambulatory Visit (HOSPITAL_BASED_OUTPATIENT_CLINIC_OR_DEPARTMENT_OTHER): Payer: PPO | Admitting: Physician Assistant

## 2022-05-17 ENCOUNTER — Telehealth: Payer: Self-pay | Admitting: Student

## 2022-05-17 ENCOUNTER — Other Ambulatory Visit: Payer: Self-pay | Admitting: Internal Medicine

## 2022-05-17 ENCOUNTER — Encounter (HOSPITAL_COMMUNITY): Payer: Self-pay | Admitting: Student

## 2022-05-17 DIAGNOSIS — M159 Polyosteoarthritis, unspecified: Secondary | ICD-10-CM

## 2022-05-17 DIAGNOSIS — K219 Gastro-esophageal reflux disease without esophagitis: Secondary | ICD-10-CM | POA: Insufficient documentation

## 2022-05-17 DIAGNOSIS — Z9181 History of falling: Secondary | ICD-10-CM | POA: Insufficient documentation

## 2022-05-17 DIAGNOSIS — Z20822 Contact with and (suspected) exposure to covid-19: Secondary | ICD-10-CM | POA: Diagnosis not present

## 2022-05-17 DIAGNOSIS — I252 Old myocardial infarction: Secondary | ICD-10-CM | POA: Diagnosis not present

## 2022-05-17 DIAGNOSIS — L0291 Cutaneous abscess, unspecified: Secondary | ICD-10-CM | POA: Diagnosis not present

## 2022-05-17 DIAGNOSIS — R911 Solitary pulmonary nodule: Secondary | ICD-10-CM

## 2022-05-17 DIAGNOSIS — E119 Type 2 diabetes mellitus without complications: Secondary | ICD-10-CM | POA: Insufficient documentation

## 2022-05-17 DIAGNOSIS — F32A Depression, unspecified: Secondary | ICD-10-CM | POA: Diagnosis not present

## 2022-05-17 DIAGNOSIS — F419 Anxiety disorder, unspecified: Secondary | ICD-10-CM | POA: Insufficient documentation

## 2022-05-17 DIAGNOSIS — R918 Other nonspecific abnormal finding of lung field: Secondary | ICD-10-CM

## 2022-05-17 DIAGNOSIS — Z955 Presence of coronary angioplasty implant and graft: Secondary | ICD-10-CM | POA: Insufficient documentation

## 2022-05-17 DIAGNOSIS — E785 Hyperlipidemia, unspecified: Secondary | ICD-10-CM | POA: Diagnosis not present

## 2022-05-17 DIAGNOSIS — I2510 Atherosclerotic heart disease of native coronary artery without angina pectoris: Secondary | ICD-10-CM

## 2022-05-17 DIAGNOSIS — M1990 Unspecified osteoarthritis, unspecified site: Secondary | ICD-10-CM | POA: Diagnosis not present

## 2022-05-17 DIAGNOSIS — F418 Other specified anxiety disorders: Secondary | ICD-10-CM | POA: Diagnosis not present

## 2022-05-17 DIAGNOSIS — M25469 Effusion, unspecified knee: Secondary | ICD-10-CM | POA: Diagnosis not present

## 2022-05-17 DIAGNOSIS — I10 Essential (primary) hypertension: Secondary | ICD-10-CM | POA: Diagnosis not present

## 2022-05-17 DIAGNOSIS — Z8673 Personal history of transient ischemic attack (TIA), and cerebral infarction without residual deficits: Secondary | ICD-10-CM | POA: Diagnosis not present

## 2022-05-17 DIAGNOSIS — J9811 Atelectasis: Secondary | ICD-10-CM | POA: Diagnosis not present

## 2022-05-17 DIAGNOSIS — K2100 Gastro-esophageal reflux disease with esophagitis, without bleeding: Secondary | ICD-10-CM

## 2022-05-17 HISTORY — PX: BRONCHIAL BIOPSY: SHX5109

## 2022-05-17 HISTORY — PX: FIDUCIAL MARKER PLACEMENT: SHX6858

## 2022-05-17 HISTORY — PX: BRONCHIAL NEEDLE ASPIRATION BIOPSY: SHX5106

## 2022-05-17 HISTORY — PX: BRONCHIAL BRUSHINGS: SHX5108

## 2022-05-17 HISTORY — PX: VIDEO BRONCHOSCOPY WITH RADIAL ENDOBRONCHIAL ULTRASOUND: SHX6849

## 2022-05-17 LAB — CBC
HCT: 41.2 % (ref 36.0–46.0)
Hemoglobin: 13.2 g/dL (ref 12.0–15.0)
MCH: 31.4 pg (ref 26.0–34.0)
MCHC: 32 g/dL (ref 30.0–36.0)
MCV: 97.9 fL (ref 80.0–100.0)
Platelets: 243 10*3/uL (ref 150–400)
RBC: 4.21 MIL/uL (ref 3.87–5.11)
RDW: 13 % (ref 11.5–15.5)
WBC: 8 10*3/uL (ref 4.0–10.5)
nRBC: 0 % (ref 0.0–0.2)

## 2022-05-17 LAB — BASIC METABOLIC PANEL
Anion gap: 4 — ABNORMAL LOW (ref 5–15)
BUN: 17 mg/dL (ref 8–23)
CO2: 30 mmol/L (ref 22–32)
Calcium: 8.7 mg/dL — ABNORMAL LOW (ref 8.9–10.3)
Chloride: 104 mmol/L (ref 98–111)
Creatinine, Ser: 1.01 mg/dL — ABNORMAL HIGH (ref 0.44–1.00)
GFR, Estimated: 56 mL/min — ABNORMAL LOW (ref >60)
Glucose, Bld: 159 mg/dL — ABNORMAL HIGH (ref 70–99)
Potassium: 4 mmol/L (ref 3.5–5.1)
Sodium: 138 mmol/L (ref 135–145)

## 2022-05-17 LAB — GLUCOSE, CAPILLARY
Glucose-Capillary: 140 mg/dL — ABNORMAL HIGH (ref 70–99)
Glucose-Capillary: 141 mg/dL — ABNORMAL HIGH (ref 70–99)

## 2022-05-17 LAB — SPECIMEN STATUS REPORT

## 2022-05-17 LAB — NOVEL CORONAVIRUS, NAA: SARS-CoV-2, NAA: NOT DETECTED

## 2022-05-17 LAB — SARS CORONAVIRUS 2 BY RT PCR: SARS Coronavirus 2 by RT PCR: NEGATIVE

## 2022-05-17 SURGERY — BRONCHOSCOPY, WITH BIOPSY USING ELECTROMAGNETIC NAVIGATION
Anesthesia: General | Laterality: Left

## 2022-05-17 MED ORDER — FENTANYL CITRATE (PF) 100 MCG/2ML IJ SOLN: 25.0000 ug | INTRAMUSCULAR | Status: DC | PRN

## 2022-05-17 MED ORDER — ONDANSETRON HCL 4 MG/2ML IJ SOLN: 4.0000 mg | Freq: Once | INTRAMUSCULAR | Status: DC | PRN

## 2022-05-17 MED ORDER — OXYCODONE HCL 5 MG/5ML PO SOLN: 5.0000 mg | Freq: Once | ORAL | Status: AC | PRN

## 2022-05-17 MED ORDER — ONDANSETRON HCL 4 MG/2ML IJ SOLN
INTRAMUSCULAR | Status: DC | PRN
  Administered 2022-05-17: 4 mg via INTRAVENOUS

## 2022-05-17 MED ORDER — ACETAMINOPHEN 325 MG PO TABS: 325.0000 mg | ORAL_TABLET | ORAL | Status: DC | PRN

## 2022-05-17 MED ORDER — INSULIN ASPART 100 UNIT/ML IJ SOLN: 0.0000 [IU] | INTRAMUSCULAR | Status: DC | PRN

## 2022-05-17 MED ORDER — OXYCODONE HCL 5 MG PO TABS
5.0000 mg | ORAL_TABLET | Freq: Once | ORAL | Status: AC | PRN
  Administered 2022-05-17: 5 mg via ORAL

## 2022-05-17 MED ORDER — LIDOCAINE 2% (20 MG/ML) 5 ML SYRINGE
INTRAMUSCULAR | Status: DC | PRN
  Administered 2022-05-17: 100 mg via INTRAVENOUS

## 2022-05-17 MED ORDER — MEPERIDINE HCL 25 MG/ML IJ SOLN: 6.2500 mg | INTRAMUSCULAR | Status: DC | PRN

## 2022-05-17 MED ORDER — PROPOFOL 10 MG/ML IV BOLUS
INTRAVENOUS | Status: DC | PRN
  Administered 2022-05-17: 100 mg via INTRAVENOUS

## 2022-05-17 MED ORDER — ACETAMINOPHEN 160 MG/5ML PO SOLN: 325.0000 mg | ORAL | Status: DC | PRN

## 2022-05-17 MED ORDER — OXYCODONE HCL 5 MG PO TABS: 5.0000 mg | ORAL_TABLET | Freq: Once | ORAL | Status: DC | PRN

## 2022-05-17 MED ORDER — OXYCODONE HCL 5 MG/5ML PO SOLN: 5.0000 mg | Freq: Once | ORAL | Status: DC | PRN

## 2022-05-17 MED ORDER — LACTATED RINGERS IV SOLN: INTRAVENOUS | Status: DC

## 2022-05-17 MED ORDER — CHLORHEXIDINE GLUCONATE 0.12 % MT SOLN
OROMUCOSAL | Status: AC
  Filled 2022-05-17: qty 15

## 2022-05-17 MED ORDER — PROPOFOL 500 MG/50ML IV EMUL
INTRAVENOUS | Status: DC | PRN
  Administered 2022-05-17: 125 ug/kg/min via INTRAVENOUS

## 2022-05-17 MED ORDER — SUGAMMADEX SODIUM 200 MG/2ML IV SOLN
INTRAVENOUS | Status: DC | PRN
  Administered 2022-05-17: 200 mg via INTRAVENOUS

## 2022-05-17 MED ORDER — PHENYLEPHRINE HCL-NACL 20-0.9 MG/250ML-% IV SOLN
INTRAVENOUS | Status: DC | PRN
  Administered 2022-05-17: 20 ug/min via INTRAVENOUS

## 2022-05-17 MED ORDER — DEXAMETHASONE SODIUM PHOSPHATE 10 MG/ML IJ SOLN
INTRAMUSCULAR | Status: DC | PRN
  Administered 2022-05-17: 5 mg via INTRAVENOUS

## 2022-05-17 MED ORDER — ROCURONIUM BROMIDE 10 MG/ML (PF) SYRINGE
PREFILLED_SYRINGE | INTRAVENOUS | Status: DC | PRN
  Administered 2022-05-17: 10 mg via INTRAVENOUS
  Administered 2022-05-17: 50 mg via INTRAVENOUS

## 2022-05-17 MED ORDER — OXYCODONE HCL 5 MG PO TABS
ORAL_TABLET | ORAL | Status: AC
  Filled 2022-05-17: qty 1

## 2022-05-17 SURGICAL SUPPLY — 1 items: superlock fiducial marker IMPLANT

## 2022-05-17 NOTE — Telephone Encounter (Signed)
Scheduled pt on 9/25 with JE. This will be printed on discharge papers.

## 2022-05-17 NOTE — Transfer of Care (Signed)
Immediate Anesthesia Transfer of Care Note  Patient: [name] [name]  Procedure(s) Performed: ROBOTIC ASSISTED NAVIGATIONAL BRONCHOSCOPY (Left) VIDEO BRONCHOSCOPY WITH RADIAL ENDOBRONCHIAL ULTRASOUND BRONCHIAL NEEDLE ASPIRATION BIOPSIES BRONCHIAL BIOPSIES FIDUCIAL MARKER PLACEMENT  Patient Location: PACU  Anesthesia Type:General  Level of Consciousness: awake, alert  and oriented  Airway & Oxygen Therapy: Patient Spontanous Breathing and Patient connected to face mask oxygen  Post-op Assessment: Report given to RN and Post -op Vital signs reviewed and stable  Post vital signs: Reviewed and stable  Last Vitals:  Vitals Value Taken Time  BP    Temp    Pulse 68 05/17/22 1110  Resp 21 05/17/22 1110  SpO2 95 % 05/17/22 1110  Vitals shown include unvalidated device data.  Last Pain:  Vitals:   05/17/22 0746  TempSrc:   PainSc: 9       Patients Stated Pain Goal: 0 (63/89/37 3428)  Complications: No notable events documented.

## 2022-05-17 NOTE — Op Note (Signed)
Video Bronchoscopy with Robotic Assisted Bronchoscopic Navigation   Date of Operation: 05/17/2022   Pre-op Diagnosis: LUL pulmonary nodule  Post-op Diagnosis: LUL pulmonary nodule  Surgeon: Walker Shadow  Anesthesia: General endotracheal anesthesia  Operation: Flexible video fiberoptic bronchoscopy with robotic assistance and biopsies.  Estimated Blood Loss: Minimal  Complications: None  Indications and History: [name] [name] is a 81 y.o. female with history of PET avid LUL pulmonary nodule. The risks, benefits, complications, treatment options and expected outcomes were discussed with the patient.  The possibilities of pneumothorax, pneumonia, reaction to medication, pulmonary aspiration, perforation of a viscus, bleeding, failure to diagnose a condition and creating a complication requiring transfusion or operation were discussed with the patient who freely signed the consent.    Description of Procedure: The patient was seen in the Preoperative Area, was examined and was deemed appropriate to proceed.  The patient was taken to [facility] endoscopy room 3, identified as [name] [name] and the procedure verified as Flexible Video Fiberoptic Bronchoscopy.  A Time Out was held and the above information confirmed.   Prior to the date of the procedure a high-resolution CT scan of the chest was performed. Utilizing ION software program a virtual tracheobronchial tree was generated to allow the creation of distinct navigation pathways to the patient's parenchymal abnormalities. After being taken to the operating room general anesthesia was initiated and the patient  was orally intubated. The video fiberoptic bronchoscope was introduced via the endotracheal tube and a general inspection was performed which showed normal right and left lung anatomy, aspiration of the bilateral mainstems was completed to remove any remaining secretions. Robotic catheter inserted into patient's endotracheal tube.   Target  #1 LUL pulmonary nodule: The distinct navigation pathways prepared prior to this procedure were then utilized to navigate to patient's lesion identified on CT scan. CIOS imaging was used to aid navigation and confirm ideal location for biopsy and later used to confirm position of needle in lesion. The robotic catheter was secured into place and the vision probe was withdrawn.  Lesion location was approximated using fluoroscopy and radial endobronchial ultrasound for peripheral targeting. Under fluoroscopic guidance transbronchial needle brushings, transbronchial needle biopsies, and transbronchial forceps biopsies were performed to be sent for cytology and pathology. A fiducial marker was placed at this position. A bronchioalveolar lavage was performed in the LUL and sent for cytology and micro.  At the end of the procedure a general airway inspection was performed and there was no evidence of active bleeding. The bronchoscope was removed.  The patient tolerated the procedure well. There was no significant blood loss and there were no obvious complications. A post-procedural chest x-ray is pending.     Samples Target #1: 1. Transbronchial needle brushings from LUL pulmonary nodule 2. Transbronchial Wang needle biopsies from LUL pulmonary nodule 3. Transbronchial forceps biopsies from LUL pulmonary nodule 4. Bronchoalveolar lavage from LUL pulmonary nodule 5. Endobronchial biopsies from LUL pulmonary nodule   Plans:  The patient will be discharged from the PACU to home when recovered from anesthesia and after chest x-ray is reviewed. We will review the cytology, pathology and microbiology results with the patient when they become available. Outpatient followup will be with either Dr. Loanne Drilling or myself in 1-2 weeks.

## 2022-05-17 NOTE — Telephone Encounter (Signed)
Can we set up clinic follow up with either Dr. Loanne Drilling or myself in 1-2 weeks to review bronchoscopy results?  Thanks!

## 2022-05-17 NOTE — Anesthesia Postprocedure Evaluation (Signed)
Anesthesia Post Note  Patient: [name] [name]  Procedure(s) Performed: ROBOTIC ASSISTED NAVIGATIONAL BRONCHOSCOPY (Left) VIDEO BRONCHOSCOPY WITH RADIAL ENDOBRONCHIAL ULTRASOUND BRONCHIAL NEEDLE ASPIRATION BIOPSIES BRONCHIAL BIOPSIES FIDUCIAL MARKER PLACEMENT BRONCHIAL BRUSHINGS     Patient location during evaluation: PACU Anesthesia Type: General Level of consciousness: awake and alert Pain management: pain level controlled Vital Signs Assessment: post-procedure vital signs reviewed and stable Respiratory status: spontaneous breathing, nonlabored ventilation, respiratory function stable and patient connected to nasal cannula oxygen Cardiovascular status: blood pressure returned to baseline and stable Postop Assessment: no apparent nausea or vomiting Anesthetic complications: no   No notable events documented.  Last Vitals:  Vitals:   05/17/22 1145 05/17/22 1200  BP: (!) 131/52 126/65  Pulse: 65 69  Resp: 12 14  Temp:  37 C  SpO2: 93% 95%    Last Pain:  Vitals:   05/17/22 1200  TempSrc:   PainSc: 0-No pain                 Arnette Driggs

## 2022-05-17 NOTE — Discharge Instructions (Signed)
-   Would wait to resume plavix until 9/16 as long as bloody cough is decreasing - I will request clinic follow up with Dr. Loanne Drilling or myself in 1-2 weeks to review results

## 2022-05-17 NOTE — Interval H&P Note (Signed)
History and Physical Interval Note:  05/17/2022 9:15 AM  [name] [name]  has presented today for surgery, with the diagnosis of LEFT PULMONARY NODULE.  The various methods of treatment have been discussed with the patient and family. After consideration of risks, benefits and other options for treatment, the patient has consented to  Procedure(s): ROBOTIC ASSISTED NAVIGATIONAL BRONCHOSCOPY (Left) as a surgical intervention.  The patient's history has been reviewed, patient examined, no change in status, stable for surgery.  I have reviewed the patient's chart and labs.  Questions were answered to the patient's satisfaction.     [name]

## 2022-05-17 NOTE — Anesthesia Procedure Notes (Signed)
Procedure Name: Intubation Date/Time: 05/17/2022 9:39 AM  Performed by: Leonor Liv, CRNAPre-anesthesia Checklist: Patient identified, Emergency Drugs available, Suction available and Patient being monitored Patient Re-evaluated:Patient Re-evaluated prior to induction Oxygen Delivery Method: Circle System Utilized Preoxygenation: Pre-oxygenation with 100% oxygen Induction Type: IV induction Ventilation: Mask ventilation without difficulty Laryngoscope Size: Mac and 3 Grade View: Grade I Tube type: Oral Tube size: 8.5 mm Number of attempts: 1 Airway Equipment and Method: Stylet and Oral airway Placement Confirmation: ETT inserted through vocal cords under direct vision, positive ETCO2 and breath sounds checked- equal and bilateral Secured at: 21 cm Tube secured with: Tape Dental Injury: Teeth and Oropharynx as per pre-operative assessment

## 2022-05-18 ENCOUNTER — Encounter: Payer: Self-pay | Admitting: Internal Medicine

## 2022-05-18 ENCOUNTER — Ambulatory Visit (INDEPENDENT_AMBULATORY_CARE_PROVIDER_SITE_OTHER): Payer: PPO | Admitting: Internal Medicine

## 2022-05-18 VITALS — BP 122/68 | HR 69 | Temp 97.9°F | Ht 59.0 in | Wt 124.5 lb

## 2022-05-18 DIAGNOSIS — K2100 Gastro-esophageal reflux disease with esophagitis, without bleeding: Secondary | ICD-10-CM | POA: Diagnosis not present

## 2022-05-18 DIAGNOSIS — E1142 Type 2 diabetes mellitus with diabetic polyneuropathy: Secondary | ICD-10-CM

## 2022-05-18 DIAGNOSIS — M159 Polyosteoarthritis, unspecified: Secondary | ICD-10-CM | POA: Diagnosis not present

## 2022-05-18 DIAGNOSIS — G8918 Other acute postprocedural pain: Secondary | ICD-10-CM | POA: Diagnosis not present

## 2022-05-18 DIAGNOSIS — M7989 Other specified soft tissue disorders: Secondary | ICD-10-CM

## 2022-05-18 LAB — D-DIMER, QUANTITATIVE: D-Dimer, Quant: 0.5 mcg/mL FEU — ABNORMAL HIGH (ref <0.50)

## 2022-05-18 MED ORDER — DICYCLOMINE HCL 10 MG PO CAPS: 10.0000 mg | ORAL_CAPSULE | Freq: Four times a day (QID) | ORAL | 0 refills | Status: DC

## 2022-05-18 MED ORDER — HYDROCODONE-ACETAMINOPHEN 5-325 MG PO TABS: 1.0000 | ORAL_TABLET | Freq: Four times a day (QID) | ORAL | 0 refills | Status: AC | PRN

## 2022-05-18 NOTE — Patient Instructions (Signed)

## 2022-05-18 NOTE — Progress Notes (Signed)
Subjective:  Patient ID: [name] [name], female    DOB: August 13, 1941  Age: 81 y.o. MRN: [id number]  CC: Follow-up   HPI [name] [name] presents for f/up -  She complains of pain and swelling in her left lower extremity.  The pain is keeping her awake and interfering with her ability to elevate her left lower leg.  The dressing remains in place.  Outpatient Medications Prior to Visit  Medication Sig Dispense Refill   acetaminophen (TYLENOL) 325 MG tablet Take 650 mg by mouth every 6 (six) hours as needed for moderate pain.     amLODipine (NORVASC) 5 MG tablet Take 1 tablet (5 mg total) by mouth daily. 90 tablet 3   Ascorbic Acid (VITAMIN C) 500 MG CAPS Take 500 mg by mouth in the morning and at bedtime.     aspirin EC 81 MG tablet Take 81 mg by mouth every morning.      atorvastatin (LIPITOR) 80 MG tablet TAKE ONE TABLET BY MOUTH EVERYDAY AT BEDTIME 90 tablet 1   Cinnamon 500 MG TABS Take 1,000 mg by mouth daily.     clopidogrel (PLAVIX) 75 MG tablet TAKE ONE TABLET BY MOUTH EVERY MORNING 90 tablet 1   ezetimibe (ZETIA) 10 MG tablet TAKE ONE TABLET BY MOUTH EVERY MORNING 90 tablet 1   gabapentin (NEURONTIN) 300 MG capsule TAKE TWO CAPSULES BY MOUTH EVERY MORNING and TAKE TWO CAPSULES BY MOUTH EVERYDAY AT BEDTIME 360 capsule 1   Krill Oil 1000 MG CAPS Take 1,000 mg by mouth daily.     losartan (COZAAR) 25 MG tablet Take 1 tablet (25 mg total) by mouth daily. 90 tablet 3   magnesium oxide (MAG-OX) 400 MG tablet Take 400 mg by mouth daily.     metoprolol succinate (TOPROL-XL) 50 MG 24 hr tablet Take 1.5 tablets (75 mg total) by mouth every morning. 135 tablet 3   Misc Natural Products (FOCUSED MIND PO) Take 15 mg by mouth daily.     nitroGLYCERIN (NITROSTAT) 0.4 MG SL tablet DISSOLVE 1 TABLET UNDER THE TONGUE EVERY 5 MINUTES AS NEEDED FOR CHEST PAIN. DO NOT EXCEED A TOTAL OF 3 DOSES IN 15 MINUTES. 25 tablet 2   Omega-3 Fatty Acids (FISH OIL) 1000 MG CAPS Take 1,000 mg by mouth in the  morning and at bedtime.     pantoprazole (PROTONIX) 40 MG tablet Take 1 tablet (40 mg total) by mouth daily. 90 tablet 1   Psyllium (DAILY FIBER PO) Take 1 capsule by mouth daily.     TURMERIC PO Take 1,000 mg by mouth in the morning and at bedtime.     venlafaxine XR (EFFEXOR-XR) 37.5 MG 24 hr capsule TAKE ONE TABLET BY MOUTH EVERY MORNING 90 capsule 1   vitamin E 1000 UNIT capsule Take 1,000 Units by mouth in the morning and at bedtime.     celecoxib (CELEBREX) 50 MG capsule TAKE ONE CAPSULE BY MOUTH twice A DAY 180 capsule 0   dicyclomine (BENTYL) 10 MG capsule TAKE ONE CAPSULE BY MOUTH FOUR TIMES DAILY 360 capsule 0   No facility-administered medications prior to visit.    ROS Review of Systems  Constitutional: Negative.  Negative for chills, diaphoresis, fatigue and fever.  HENT: Negative.    Eyes: Negative.   Respiratory:  Negative for cough, chest tightness and shortness of breath.   Cardiovascular:  Positive for leg swelling. Negative for chest pain and palpitations.  Gastrointestinal:  Negative for abdominal pain, constipation, diarrhea and  nausea.  Endocrine: Negative.   Genitourinary: Negative.  Negative for difficulty urinating.  Musculoskeletal:  Positive for arthralgias. Negative for myalgias and neck pain.  Skin:  Positive for wound. Negative for color change and pallor.  Neurological:  Negative for dizziness, weakness, light-headedness and headaches.  Hematological:  Negative for adenopathy. Does not bruise/bleed easily.  Psychiatric/Behavioral: Negative.      Objective:  BP 122/68   Pulse 69   Temp 97.9 F (36.6 C) (Oral)   Ht '4\' 11"'$  (1.499 m)   Wt 124 lb 8 oz (56.5 kg)   SpO2 92%   BMI 25.15 kg/m   BP Readings from Last 3 Encounters:  05/18/22 122/68  05/17/22 126/65  05/16/22 (!) 142/74    Wt Readings from Last 3 Encounters:  05/18/22 124 lb 8 oz (56.5 kg)  05/17/22 121 lb (54.9 kg)  05/16/22 121 lb (54.9 kg)    Physical Exam Vitals reviewed.   HENT:     Nose: Nose normal.     Mouth/Throat:     Mouth: Mucous membranes are moist.  Eyes:     General: No scleral icterus.    Conjunctiva/sclera: Conjunctivae normal.  Cardiovascular:     Rate and Rhythm: Normal rate and regular rhythm.     Pulses:          Carotid pulses are 1+ on the right side and 1+ on the left side.      Radial pulses are 1+ on the right side and 1+ on the left side.       Femoral pulses are 1+ on the right side and 1+ on the left side.      Popliteal pulses are 1+ on the right side and 1+ on the left side.       Dorsalis pedis pulses are 1+ on the right side and 1+ on the left side.       Posterior tibial pulses are 1+ on the right side and 1+ on the left side.  Pulmonary:     Effort: Pulmonary effort is normal.     Breath sounds: No stridor. No wheezing, rhonchi or rales.  Abdominal:     General: Abdomen is flat.     Palpations: There is no mass.     Tenderness: There is no abdominal tenderness. There is no guarding.     Hernia: No hernia is present.  Musculoskeletal:     Cervical back: Neck supple.     Right lower leg: No edema.     Left lower leg: 1+ Pitting Edema present.  Lymphadenopathy:     Cervical: No cervical adenopathy.  Skin:    General: Skin is warm and dry.     Findings: No erythema.     Lab Results  Component Value Date   WBC 8.0 05/17/2022   HGB 13.2 05/17/2022   HCT 41.2 05/17/2022   PLT 243 05/17/2022   GLUCOSE 159 (H) 05/17/2022   CHOL 109 10/04/2021   TRIG 110.0 10/04/2021   HDL 48.00 10/04/2021   LDLDIRECT 42.0 09/12/2020   LDLCALC 39 10/04/2021   ALT 29 03/05/2022   AST 22 03/05/2022   NA 138 05/17/2022   K 4.0 05/17/2022   CL 104 05/17/2022   CREATININE 1.01 (H) 05/17/2022   BUN 17 05/17/2022   CO2 30 05/17/2022   TSH 3.300 12/21/2021   INR 1.0 05/02/2021   HGBA1C 7.2 (H) 03/05/2022   MICROALBUR 2.5 (H) 10/04/2021    DG Chest Port 1 View  Result  Date: 05/17/2022 CLINICAL DATA:  Post bronchoscopy today.  EXAM: PORTABLE CHEST 1 VIEW COMPARISON:  Radiographs 03/05/2022 and 06/17/2014. CT 03/22/2022. PET-CT 04/20/2022. FINDINGS: 1116 hours. Lower lung volumes. Allowing for that, the heart size and mediastinal contours are stable with aortic atherosclerosis. The left apical lesion is less well-defined suggesting a small amount of hemorrhage related to interval bronchoscopy and presumed biopsy. There is an adjacent small biopsy clip. Mild atelectasis is present at both lung bases. No evidence of pneumothorax or significant pleural effusion. Extensive postsurgical changes are present of the lower cervical spine. IMPRESSION: No evidence of pneumothorax following bronchoscopy. The known left apical nodule is less well-defined, suggesting a small amount of surrounding hemorrhage. Electronically Signed   By: Richardean Sale M.D.   On: 05/17/2022 11:57   DG C-ARM BRONCHOSCOPY  Result Date: 05/17/2022 C-ARM BRONCHOSCOPY: Fluoroscopy was utilized by the requesting physician.  No radiographic interpretation.    Assessment & Plan:   Kathlynn was seen today for follow-up.  Diagnoses and all orders for this visit:  Left leg swelling- D-dimer is negative.  The wound is healing well. -     D-dimer, quantitative; Future -     D-dimer, quantitative  Diabetic peripheral neuropathy (HCC) -     HYDROcodone-acetaminophen (NORCO/VICODIN) 5-325 MG tablet; Take 1 tablet by mouth every 6 (six) hours as needed for up to 7 days for moderate pain.  Primary osteoarthritis involving multiple joints -     HYDROcodone-acetaminophen (NORCO/VICODIN) 5-325 MG tablet; Take 1 tablet by mouth every 6 (six) hours as needed for up to 7 days for moderate pain.  Acute post-operative pain -     HYDROcodone-acetaminophen (NORCO/VICODIN) 5-325 MG tablet; Take 1 tablet by mouth every 6 (six) hours as needed for up to 7 days for moderate pain.  Gastroesophageal reflux disease with esophagitis without hemorrhage -     dicyclomine (BENTYL) 10  MG capsule; Take 1 capsule (10 mg total) by mouth 4 (four) times daily.   I have discontinued [employer]aci [employer] "FAYE"'s celecoxib. I have also changed her dicyclomine. Additionally, I am having her start on HYDROcodone-acetaminophen. Lastly, I am having her maintain her Vitamin C, aspirin EC, Krill Oil, Cinnamon, TURMERIC PO, magnesium oxide, vitamin E, Psyllium (DAILY FIBER PO), Misc Natural Products (FOCUSED MIND PO), amLODipine, nitroGLYCERIN, metoprolol succinate, losartan, gabapentin, ezetimibe, atorvastatin, clopidogrel, venlafaxine XR, pantoprazole, Fish Oil, and acetaminophen.  Meds ordered this encounter  Medications   HYDROcodone-acetaminophen (NORCO/VICODIN) 5-325 MG tablet    Sig: Take 1 tablet by mouth every 6 (six) hours as needed for up to 7 days for moderate pain.    Dispense:  25 tablet    Refill:  0   dicyclomine (BENTYL) 10 MG capsule    Sig: Take 1 capsule (10 mg total) by mouth 4 (four) times daily.    Dispense:  360 capsule    Refill:  0     Follow-up: Return in about 3 weeks (around 06/08/2022).  [name], MD

## 2022-05-21 LAB — WOUND CULTURE: RESULT:: NO GROWTH

## 2022-05-21 LAB — ACID FAST SMEAR (AFB, MYCOBACTERIA): Acid Fast Smear: NEGATIVE

## 2022-05-21 LAB — CYTOLOGY - NON PAP

## 2022-05-22 LAB — AEROBIC/ANAEROBIC CULTURE W GRAM STAIN (SURGICAL/DEEP WOUND)
Culture: NO GROWTH
Gram Stain: NONE SEEN

## 2022-05-22 LAB — CYTOLOGY - NON PAP

## 2022-05-24 ENCOUNTER — Telehealth: Payer: Self-pay | Admitting: Student

## 2022-05-24 NOTE — Telephone Encounter (Signed)
Attempted to call to discuss bronch results. Left voicemail.

## 2022-05-28 ENCOUNTER — Ambulatory Visit: Payer: PPO | Admitting: Pulmonary Disease

## 2022-05-28 ENCOUNTER — Encounter: Payer: Self-pay | Admitting: Pulmonary Disease

## 2022-05-28 VITALS — BP 138/64 | HR 71 | Ht 59.0 in | Wt 123.0 lb

## 2022-05-28 DIAGNOSIS — R2242 Localized swelling, mass and lump, left lower limb: Secondary | ICD-10-CM

## 2022-05-28 DIAGNOSIS — R911 Solitary pulmonary nodule: Secondary | ICD-10-CM

## 2022-05-28 NOTE — Progress Notes (Addendum)
Subjective:   PATIENT ID: [name] [name] GENDER: female DOB: Jul 19, 1941, MRN: [id number]   HPI  Chief Complaint  Patient presents with   Follow-up    [place] causing blood clot in leg. Pcp had to cut it out    Reason for Visit: Follow-up   [name] [name] is an 81 year old female never smoker with CAD s/p DES x 4 (last 2021), HTN, DM2, GERD, hx blood clots related to trauma s/p excision who presents for follow-up.  Initial consult She recently was seen by her PCP for fever and fatigue with no improvement on Z-pack. Dx with UTI however noted to have abnormal CXR leading to evaluation with CT Chest with demonstrated nodular density measuring 19 x 14 mm in the left apex concerning for malignancy. No mediastinal or hilar adenopathy.  At baseline she sometimes ambulate with a cane. Currently living independently and perform ADLs but is moving to a senior assistance for cleaning and meal assistance. Daughter reports she is sometimes a fall risk.  She walks up stairs slowly and will get winded. Denies cough, wheezing, hemoptysis, unintentional weight loss, night sweats or unexplained fevers/chills.  04/30/22 Daughter present to provide additional history. Reports intermittent low-grade fevers and fatigue. Denies significant weight loss or appetite change. Denies night sweats.  05/28/22 Daughter present and provides additional history with patient. Over two weeks ago she reports mechanical fall with left facial bruising and left knee swelling and bruising. Leg is tender with walking. She underwent navigational bronchoscopy on 9/14 which she tolerated well Denies shortness of breath, cough or wheezing. No unintentional weight loss, night sweats, unexplained fevers or chills.  Social History: Never smoker Significant second smoke exposure Mother with pancreatic cancer Brother with lung cancer, heavy smoker Brother with laryngeal cancer, lived near [place]  Environmental exposures: Significant mold in apartment  Past Medical History:  Diagnosis Date   Allergy    Anal fissure    Anxiety    CAD (coronary artery disease)    s/p inf MI 2007 - tx w/ DES to RCA // s/p POBA to [facility] and DES to [facility] in 11/18 ([facility] in [place], [place]) // Myoview 3/21: low risk // s/p DES to pRCA   Cataract    removed both eyes   Cerebrovascular disease    Carotid US 09/2017 [facility]): mild plaque (<50%) in both carotid arteries   Chronic neck pain    Chronic pain syndrome    Diabetes Mellitus, Type 2    Diverticular disease    DJD (degenerative joint disease)    Echocardiogram 10/2018    Echo 10/2018: EF 55-60, normal RVSF, mod MAC, mod TR, severe AoV calcification and sclerosis with nodular calcium/mobile area of calcium in the LVOT (small veg vs Lambl's excrescence  - consider TEE), mild AI, mild AS (mean 11).    Echocardiogram 04/2019    Echocardiogram 04/2019: EF 60-65, basal septal hypertrophy, grade 2 diastolic dysfunction, normal wall motion, normal RV SF, mild LAE, mod MAC, trivial MR, mod sclerosis of the aortic valve with mod aortic annular calcification, thin mobile filamentous structure on ventricular side of AV likely representing Lambl's excrescence, mild AI, mild TR   Frequent UTI's    Gastroesophageal reflux disease    H/O bacterial endocarditis    Rx w IV Abxs in 2020 ([facility] in [place], [place]) // F/u echo with AoV Lambl's excrescence   H/O hiatal hernia    HTN (hypertension)  Hx of fall 10/2020   Hx of MI 2007   Hx of Stroke    IBS (irritable bowel syndrome)    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 (Cervical Fusion)   Premature  ventricular contractions    Tubular adenoma of colon    Ulcer    sam [place] gi   Urinary incontinence      Family History  Problem Relation Age of Onset   Heart disease Mother    Pancreatic cancer Mother    Lung cancer Brother    Cancer - Other Brother        vocal cord cancer   Colon polyps Sister    Coronary artery disease Other        sibling   Coronary artery disease Other        sibling   Colon cancer Neg Hx    Esophageal cancer Neg Hx    Rectal cancer Neg Hx    Stomach cancer Neg Hx      Social History   Occupational History    Employer: RETIRED  Tobacco Use   Smoking status: Never    Passive exposure: Never   Smokeless tobacco: Never  Vaping Use   Vaping Use: Never used  Substance and Sexual Activity   Alcohol use: No   Drug use: No   Sexual activity: Yes    Partners: Male    Allergies  Allergen Reactions   Macrobid [Nitrofurantoin] Nausea Only    Severe nausea   Sulfa Antibiotics Itching   Niacin And Related Other (See Comments)    Must take "Flush-free"   Codeine Nausea Only   Prednisone Itching and Rash   Rocephin [Ceftriaxone] Itching   Sulfonamide Derivatives Itching   Tetracycline Itching and Rash     Outpatient Medications Prior to Visit  Medication Sig Dispense Refill   acetaminophen (TYLENOL) 325 MG tablet Take 650 mg by mouth every 6 (six) hours as needed for moderate pain.     amLODipine (NORVASC) 5 MG tablet Take 1 tablet (5 mg total) by mouth daily. 90 tablet 3   Ascorbic Acid (VITAMIN C) 500 MG CAPS Take 500 mg by mouth in the morning and at bedtime.     aspirin EC 81 MG tablet Take 81 mg by mouth every morning.      atorvastatin (LIPITOR) 80 MG tablet TAKE ONE TABLET BY MOUTH EVERYDAY AT BEDTIME 90 tablet 1   Cinnamon 500 MG TABS Take 1,000 mg by mouth daily.     clopidogrel (PLAVIX) 75 MG tablet TAKE ONE TABLET BY MOUTH EVERY MORNING 90 tablet 1   dicyclomine (BENTYL) 10 MG capsule Take 1 capsule (10 mg total) by mouth 4 (four)  times daily. 360 capsule 0   ezetimibe (ZETIA) 10 MG tablet TAKE ONE TABLET BY MOUTH EVERY MORNING 90 tablet 1   gabapentin (NEURONTIN) 300 MG capsule TAKE TWO CAPSULES BY MOUTH EVERY MORNING and TAKE TWO CAPSULES BY MOUTH EVERYDAY AT BEDTIME 360 capsule 1   Krill Oil 1000 MG CAPS Take 1,000 mg by mouth daily.     losartan (COZAAR) 25 MG tablet Take 1 tablet (25  mg total) by mouth daily. 90 tablet 3   magnesium oxide (MAG-OX) 400 MG tablet Take 400 mg by mouth daily.     metoprolol succinate (TOPROL-XL) 50 MG 24 hr tablet Take 1.5 tablets (75 mg total) by mouth every morning. 135 tablet 3   Misc Natural Products (FOCUSED MIND PO) Take 15 mg by mouth daily.     nitroGLYCERIN (NITROSTAT) 0.4 MG SL tablet DISSOLVE 1 TABLET UNDER THE TONGUE EVERY 5 MINUTES AS NEEDED FOR CHEST PAIN. DO NOT EXCEED A TOTAL OF 3 DOSES IN 15 MINUTES. 25 tablet 2   Omega-3 Fatty Acids (FISH OIL) 1000 MG CAPS Take 1,000 mg by mouth in the morning and at bedtime.     pantoprazole (PROTONIX) 40 MG tablet Take 1 tablet (40 mg total) by mouth daily. 90 tablet 1   Psyllium (DAILY FIBER PO) Take 1 capsule by mouth daily.     TURMERIC PO Take 1,000 mg by mouth in the morning and at bedtime.     venlafaxine XR (EFFEXOR-XR) 37.5 MG 24 hr capsule TAKE ONE TABLET BY MOUTH EVERY MORNING 90 capsule 1   vitamin E 1000 UNIT capsule Take 1,000 Units by mouth in the morning and at bedtime.     No facility-administered medications prior to visit.    Review of Systems  Constitutional:  Negative for chills, diaphoresis, fever, malaise/fatigue and weight loss.  HENT:  Negative for congestion.   Respiratory:  Negative for cough, hemoptysis, sputum production, shortness of breath and wheezing.   Cardiovascular:  Positive for leg swelling. Negative for chest pain and palpitations.  Musculoskeletal:  Positive for joint pain.     Objective:   Vitals:   05/28/22 1322  BP: 138/64  Pulse: 71  SpO2: 100%  Weight: 123 lb (55.8 kg)   Height: _0  (1.499 m)   SpO2: 100 % O2 Device: None (Room air)  Physical Exam: General: Elderly, frail-appearing, no acute distress HENT: [place], AT, left facial bruising, resolving left raccoon eye, improved compared to pictures  Eyes: EOMI, no scleral icterus Respiratory: Clear to auscultation bilaterally.  No crackles, wheezing or rales Cardiovascular: RRR, -M/R/G, no JVD Extremities: 1+ pitting edema of eft lower extremity edema, + mild tenderness Neuro: AAO x4, CNII-XII grossly intact Psych: Normal mood, normal affect   Data Reviewed:  Imaging: CT Chest 03/22/22 - nodular density measuring 19 x 14 mm in the left apex. No hilar or mediastinal adenopathy PET 6/78/93 - Hypermetabolic 1.8 cm LUL apical lung nodule. No adenopathy or mets.  PFT: None on file  Labs: CBC    Component Value Date/Time   WBC 8.0 05/17/2022 0743   RBC 4.21 05/17/2022 0743   HGB 13.2 05/17/2022 0743   HGB 14.3 12/21/2021 1538   HCT 41.2 05/17/2022 0743   HCT 42.7 12/21/2021 1538   PLT 243 05/17/2022 0743   PLT 239 12/21/2021 1538   MCV 97.9 05/17/2022 0743   MCV 94 12/21/2021 1538   MCH 31.4 05/17/2022 0743   MCHC 32.0 05/17/2022 0743   RDW 13.0 05/17/2022 0743   RDW 12.3 12/21/2021 1538   LYMPHSABS 2.1 03/05/2022 0841   MONOABS 0.7 03/05/2022 0841   EOSABS 0.2 03/05/2022 0841   BASOSABS 0.1 03/05/2022 0841   Absolute 03/05/22 - 200     Assessment & Plan:   Discussion: 81 year old female never smoker with CAD s/p DES x 4 (last 2021), HTN, DM2, GERD and hx blood clots related to trauma s/p excision who presents for follow-up.  Recently underwent bronchoscopy for PET-avid left upper lobe nodule measuring 1.8 cm. Cytology from [facility], brushing and BAL of LUL neg. Cultures pending with prelim results negative. Despite no evidence of malignancy on biopsy, concerned for false negative given the PET-avidity of nodule. Recommended repeat imaging and potential repeat biopsy if lesion demonstrates  enlargement in the future.  PET-avid left upper lobe lung nodule measuring 1.8 cm --Reviewed bronchoscopy results. No malignancy however recommend continued surveillance based on PET-avidity and size of lung --ORDER CT Chest without contrast for November 15th  ADDENDUM 06/08/22: Discussed with Claudette Laws, MD Pathology - "Reviewed the slides and there are fragments of fibrosis which could indicated interstitial pneumonitis or organizing pnuemonia but nothing specific.  There are no granulomas or definitive findings to indicate infection.  "  LLE swelling Mechanical fall Hx DVT related to falls in the past --ORDER left lower extremity ultrasound to rule out DVT  Health Maintenance Immunization History  Administered Date(s) Administered   Fluad Quad(high Dose 65+) 04/30/2019, 06/27/2020, 05/12/2021   Influenza Split 07/18/2011, 05/19/2012   Influenza Whole 06/22/2009, 05/15/2010   Influenza, High Dose Seasonal PF 07/20/2015, 05/24/2016, 06/18/2018   Influenza,inj,Quad PF,6+ Mos 06/15/2013, 05/21/2014   Influenza-Unspecified 06/24/2017   Pneumococcal Conjugate-13 01/20/2014   Pneumococcal Polysaccharide-23 11/08/2014, 11/13/2017   Tdap 01/20/2014, 05/11/2022   Zoster, Live 07/08/2014   CT Lung Screen - not qualified due to age  Orders Placed This Encounter  Procedures   CT Chest Wo Contrast    Schedule the week of November 15,16,17. Please ensure patient has follow-up after scan    Standing Status:   Future    Standing Expiration Date:   05/29/2023    Scheduling Instructions:     Schedule the week of November 15,16,17. Please ensure patient has follow-up after scan    Order Specific Question:   Preferred imaging location?    Answer:   [facility]  No orders of the defined types were placed in this encounter.   Return in about 7 weeks (around 07/18/2022).   I have spent a total time of 35-minutes on the day of the appointment including chart review, data review,  collecting history, coordinating care and discussing medical diagnosis and plan with the patient/family. Past medical history, allergies, medications were reviewed. Pertinent imaging, labs and tests included in this note have been reviewed and interpreted independently by me.  [place], MD [facility] 05/28/2022  Office Number [phone]

## 2022-05-28 NOTE — Patient Instructions (Signed)
Left upper lobe lung mass --Reviewed bronchoscopy results. No malignancy however recommend continued surveillance based on PET-avidity and size of lung --ORDER CT Chest without contrast for November 15th  LLE swelling Mechanical fall Hx DVT related to falls in the past --ORDER left lower extremity ultrasound to rule out DVT  Follow-up with me after CT scan the week of November 15-17th

## 2022-05-29 ENCOUNTER — Ambulatory Visit (HOSPITAL_COMMUNITY)
Admission: RE | Admit: 2022-05-29 | Discharge: 2022-05-29 | Disposition: A | Discharge status: Home / Self Care | Payer: PPO | Source: Ambulatory Visit | Attending: Pulmonary Disease | Admitting: Pulmonary Disease

## 2022-05-29 DIAGNOSIS — R2242 Localized swelling, mass and lump, left lower limb: Secondary | ICD-10-CM | POA: Diagnosis not present

## 2022-05-29 NOTE — Progress Notes (Signed)
Left lower extremity venous duplex completed. Refer to "CV Proc" under chart review to view preliminary results.  05/29/2022 1:16 PM Kelby Aline., MHA, RVT, RDCS, RDMS

## 2022-06-01 DIAGNOSIS — M159 Polyosteoarthritis, unspecified: Secondary | ICD-10-CM | POA: Diagnosis not present

## 2022-06-01 DIAGNOSIS — Z79899 Other long term (current) drug therapy: Secondary | ICD-10-CM | POA: Diagnosis not present

## 2022-06-01 DIAGNOSIS — Z23 Encounter for immunization: Secondary | ICD-10-CM | POA: Diagnosis not present

## 2022-06-01 DIAGNOSIS — I509 Heart failure, unspecified: Secondary | ICD-10-CM | POA: Diagnosis not present

## 2022-06-01 DIAGNOSIS — I110 Hypertensive heart disease with heart failure: Secondary | ICD-10-CM | POA: Diagnosis not present

## 2022-06-01 DIAGNOSIS — I2510 Atherosclerotic heart disease of native coronary artery without angina pectoris: Secondary | ICD-10-CM | POA: Diagnosis not present

## 2022-06-01 DIAGNOSIS — I700 Atherosclerosis of aorta: Secondary | ICD-10-CM | POA: Diagnosis not present

## 2022-06-01 DIAGNOSIS — N289 Disorder of kidney and ureter, unspecified: Secondary | ICD-10-CM | POA: Diagnosis not present

## 2022-06-01 DIAGNOSIS — E1140 Type 2 diabetes mellitus with diabetic neuropathy, unspecified: Secondary | ICD-10-CM | POA: Diagnosis not present

## 2022-06-01 DIAGNOSIS — F419 Anxiety disorder, unspecified: Secondary | ICD-10-CM | POA: Diagnosis not present

## 2022-06-01 DIAGNOSIS — I252 Old myocardial infarction: Secondary | ICD-10-CM | POA: Diagnosis not present

## 2022-06-01 DIAGNOSIS — Z1159 Encounter for screening for other viral diseases: Secondary | ICD-10-CM | POA: Diagnosis not present

## 2022-06-04 ENCOUNTER — Ambulatory Visit: Payer: PPO | Admitting: Cardiovascular Disease

## 2022-06-18 ENCOUNTER — Ambulatory Visit: Payer: Medicare HMO | Attending: Cardiovascular Disease | Admitting: Cardiovascular Disease

## 2022-06-18 ENCOUNTER — Encounter: Payer: Self-pay | Admitting: Cardiovascular Disease

## 2022-06-18 VITALS — BP 136/70 | HR 66 | Ht 59.0 in | Wt 121.8 lb

## 2022-06-18 DIAGNOSIS — I25118 Atherosclerotic heart disease of native coronary artery with other forms of angina pectoris: Secondary | ICD-10-CM | POA: Diagnosis not present

## 2022-06-18 DIAGNOSIS — I10 Essential (primary) hypertension: Secondary | ICD-10-CM | POA: Diagnosis not present

## 2022-06-18 DIAGNOSIS — E782 Mixed hyperlipidemia: Secondary | ICD-10-CM | POA: Diagnosis not present

## 2022-06-18 NOTE — Patient Instructions (Signed)
Medication Instructions:  Your physician recommends that you continue on your current medications as directed. Please refer to the Current Medication list given to you today.  *If you need a refill on your cardiac medications before your next appointment, please call your pharmacy*   Lab Work: NONE If you have labs (blood work) drawn today and your tests are completely normal, you will receive your results only by: MyChart Message (if you have MyChart) OR A paper copy in the mail If you have any lab test that is abnormal or we need to change your treatment, we will call you to review the results.   Testing/Procedures: NONE   Follow-Up: At [facility], you and your health needs are our priority.  As part of our continuing mission to provide you with exceptional heart care, we have created designated Provider Care Teams.  These Care Teams include your primary Cardiologist (physician) and Advanced Practice Providers (APPs -  Physician Assistants and Nurse Practitioners) who all work together to provide you with the care you need, when you need it.  Your next appointment:   6 month(s)  The format for your next appointment:   In Person  Provider:   Michael Cooper, MD  or APP     Important Information About Sugar       

## 2022-06-18 NOTE — Progress Notes (Signed)
Cardiology Office Note:    Date:  06/18/2022   ID:  [name] [name], DOB 11/21/1940, MRN [id number]  PCP:  Janith Lima, MD   [facility] Cardiologist:  Sherren Mocha, MD Cardiology APP:  Sharmon Revere     Referring MD: Janith Lima, MD   Chief Complaint  Patient presents with   Coronary Artery Disease    History of Present Illness:    [name] [name] is a 81 y.o. 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  Notes are reviewed and there are plans to repeat a CT scan to assess for stability.  The patient is here with her daughter today.  She has rare symptoms of chest discomfort and takes nitroglycerin with complete relief.  She states this occurs very rarely.  She does not have any symptoms with her normal activities.  She denies exertional dyspnea, orthopnea, PND, leg swelling, or heart palpitations.  She is compliant with her medications.  Past Medical History:  Diagnosis Date   Allergy    Anal fissure    Anxiety    CAD (coronary artery disease)    s/p inf MI 2007 - tx w/ DES to RCA // s/p POBA to [facility] and DES to [place] in 11/18 ([facility] in [place], [place]) // Myoview 3/21: low risk // s/p DES to pRCA   Cataract    removed both eyes   Cerebrovascular disease    Carotid US 09/2017  [facility]): mild plaque (<50%) in both carotid arteries   Chronic neck pain    Chronic pain syndrome    Diabetes Mellitus, Type 2    Diverticular disease    DJD (degenerative joint disease)    Echocardiogram 10/2018    Echo 10/2018: EF 55-60, normal RVSF, mod MAC, mod TR, severe AoV calcification and sclerosis with nodular calcium/mobile area of calcium in the LVOT (small veg vs Lambl's excrescence  - consider TEE), mild AI, mild AS (mean 11).    Echocardiogram 04/2019    Echocardiogram 04/2019: EF 60-65, basal septal hypertrophy, grade 2 diastolic dysfunction, normal wall motion, normal RV SF, mild LAE, mod MAC, trivial MR, mod sclerosis of the aortic valve with mod aortic annular calcification, thin mobile filamentous structure on ventricular side of AV likely representing Lambl's excrescence, mild AI, mild TR   Frequent UTI's    Gastroesophageal reflux disease    H/O bacterial endocarditis    Rx w IV Abxs in 2020 ([facility] in [place], [place]) // F/u echo with AoV Lambl's excrescence   H/O hiatal hernia    HTN (hypertension)    Hx of fall 10/2020   Hx of MI 2007   Hx of Stroke    IBS (irritable bowel syndrome)    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 (Cervical Fusion)   Premature ventricular contractions    Tubular adenoma of colon    Ulcer    [facility]   Urinary incontinence     Past Surgical History:  Procedure Laterality Date   APPENDECTOMY  1955   BLADDER REPAIR  2007   BRONCHIAL BIOPSY  05/17/2022   Procedure: BRONCHIAL BIOPSIES;  Surgeon: [name], MD;  Location: [facility] ENDOSCOPY;  Service: Pulmonary;;   BRONCHIAL BRUSHINGS  05/17/2022   Procedure: BRONCHIAL BRUSHINGS;   Surgeon: [name], MD;  Location: [facility] ENDOSCOPY;  Service: Pulmonary;;   BRONCHIAL NEEDLE ASPIRATION BIOPSY  05/17/2022   Procedure: BRONCHIAL NEEDLE ASPIRATION BIOPSIES;  Surgeon: [name], MD;  Location: [facility] ENDOSCOPY;  Service: Pulmonary;;   CARDIAC CATHETERIZATION  2007   Stents   CERVICAL SPINE SURGERY  07/2005   Dr Consuello Masse   CHOLECYSTECTOMY  2007   COLONOSCOPY     CORONARY PRESSURE WIRE/FFR WITH 3D MAPPING N/A 06/28/2021   Procedure: Coronary Pressure Wire/FFR w/3D Mapping;  Surgeon: Burnell Blanks, MD;  Location: [place] CV LAB;  Service: Cardiovascular;  Laterality: N/A;   CORONARY STENT INTERVENTION N/A 03/09/2020   Procedure: CORONARY STENT INTERVENTION;  Surgeon: Burnell Blanks, MD;  Location: [place] CV LAB;  Service: Cardiovascular;  Laterality: N/A;   CORONARY STENT INTERVENTION N/A 06/28/2021   Procedure: CORONARY STENT INTERVENTION;  Surgeon: Burnell Blanks, MD;  Location: [place] CV LAB;  Service: Cardiovascular;  Laterality: N/A;   DENTAL SURGERY  05/2013   replaced an inplant   EYE SURGERY  2011   Bilteral   FIDUCIAL MARKER PLACEMENT  05/17/2022   Procedure: FIDUCIAL MARKER PLACEMENT;  Surgeon: [name], MD;  Location: [facility] ENDOSCOPY;  Service: Pulmonary;;  LUL   I & D KNEE WITH POLY EXCHANGE  09/25/2011   Procedure: IRRIGATION AND DEBRIDEMENT KNEE WITH POLY EXCHANGE;  Surgeon: Johnny Bridge, MD;  Location: [place];  Service: Orthopedics;  Laterality: Left;   INCISION AND DRAINAGE ABSCESS / HEMATOMA OF BURSA / KNEE / THIGH  09/2011   JOINT REPLACEMENT  2012   left   LEFT HEART CATH AND CORONARY ANGIOGRAPHY N/A 03/09/2020   Procedure: LEFT HEART CATH AND CORONARY ANGIOGRAPHY;  Surgeon: Burnell Blanks, MD;  Location: [place] CV LAB;  Service: Cardiovascular;  Laterality: N/A;   LEFT HEART CATH AND CORONARY ANGIOGRAPHY N/A 06/28/2021   Procedure: LEFT HEART CATH AND CORONARY ANGIOGRAPHY;  Surgeon:  Burnell Blanks, MD;  Location: [place] CV LAB;  Service: Cardiovascular;  Laterality: N/A;   left Total Knee Replacement  11/2010   Dr Percell Miller   NECK SURGERY     has had 3 surgeries   POLYPECTOMY     POSTERIOR CERVICAL FUSION/FORAMINOTOMY  04/08/2012   Procedure: POSTERIOR CERVICAL FUSION/FORAMINOTOMY LEVEL 2;  Surgeon: Otilio Connors, MD;  Location: [place] NEURO ORS;  Service: Neurosurgery;  Laterality: Left;  Left Cervical six-seven Foraminotomy, bilateral cervical seven-thoracic one foraminotomy, cervical six-seven, cervical seven-thoracic one fusion with posterior instrumentation   TEMPOROMANDIBULAR JOINT SURGERY     thumb surgery     TONSILLECTOMY     TONSILLECTOMY  1950  TOTAL ABDOMINAL HYSTERECTOMY     VIDEO BRONCHOSCOPY WITH RADIAL ENDOBRONCHIAL ULTRASOUND  05/17/2022   Procedure: VIDEO BRONCHOSCOPY WITH RADIAL ENDOBRONCHIAL ULTRASOUND;  Surgeon: [name], MD;  Location: [facility] ENDOSCOPY;  Service: Pulmonary;;    Current Medications: Current Meds  Medication Sig   acetaminophen (TYLENOL) 325 MG tablet Take 650 mg by mouth every 6 (six) hours as needed for moderate pain.   amLODipine (NORVASC) 5 MG tablet Take 1 tablet (5 mg total) by mouth daily.   Ascorbic Acid (VITAMIN C) 500 MG CAPS Take 500 mg by mouth in the morning and at bedtime.   aspirin EC 81 MG tablet Take 81 mg by mouth every morning.    atorvastatin (LIPITOR) 80 MG tablet TAKE ONE TABLET BY MOUTH EVERYDAY AT BEDTIME   Cinnamon 500 MG TABS Take 1,000 mg by mouth daily.   clopidogrel (PLAVIX) 75 MG tablet TAKE ONE TABLET BY MOUTH EVERY MORNING   dicyclomine (BENTYL) 10 MG capsule Take 1 capsule (10 mg total) by mouth 4 (four) times daily.   ezetimibe (ZETIA) 10 MG tablet TAKE ONE TABLET BY MOUTH EVERY MORNING   gabapentin (NEURONTIN) 300 MG capsule TAKE TWO CAPSULES BY MOUTH EVERY MORNING and TAKE TWO CAPSULES BY MOUTH EVERYDAY AT BEDTIME   Krill Oil 1000 MG CAPS Take 1,000 mg by mouth daily.   losartan  (COZAAR) 25 MG tablet Take 1 tablet (25 mg total) by mouth daily.   magnesium oxide (MAG-OX) 400 MG tablet Take 400 mg by mouth daily.   metoprolol succinate (TOPROL-XL) 50 MG 24 hr tablet Take 1.5 tablets (75 mg total) by mouth every morning.   Misc Natural Products (FOCUSED MIND PO) Take 15 mg by mouth daily.   nitroGLYCERIN (NITROSTAT) 0.4 MG SL tablet DISSOLVE 1 TABLET UNDER THE TONGUE EVERY 5 MINUTES AS NEEDED FOR CHEST PAIN. DO NOT EXCEED A TOTAL OF 3 DOSES IN 15 MINUTES.   Omega-3 Fatty Acids (FISH OIL) 1000 MG CAPS Take 1,000 mg by mouth in the morning and at bedtime.   pantoprazole (PROTONIX) 40 MG tablet Take 1 tablet (40 mg total) by mouth daily.   Psyllium (DAILY FIBER PO) Take 1 capsule by mouth daily.   TURMERIC PO Take 1,000 mg by mouth in the morning and at bedtime.   venlafaxine XR (EFFEXOR-XR) 37.5 MG 24 hr capsule TAKE ONE TABLET BY MOUTH EVERY MORNING   vitamin E 1000 UNIT capsule Take 1,000 Units by mouth in the morning and at bedtime.     Allergies:   Macrobid [nitrofurantoin], Sulfa antibiotics, Niacin and related, Codeine, Prednisone, Rocephin [ceftriaxone], Sulfonamide derivatives, and Tetracycline   Social History   Socioeconomic History   Marital status: Widowed    Spouse name: dolly Pitcher   Number of children: 6   Years of education: Not on file   Highest education level: Not on file  Occupational History    Employer: RETIRED  Tobacco Use   Smoking status: Never    Passive exposure: Never   Smokeless tobacco: Never  Vaping Use   Vaping Use: Never used  Substance and Sexual Activity   Alcohol use: No   Drug use: No   Sexual activity: Yes    Partners: Male  Other Topics Concern   Not on file  Social History Narrative   Pt never knew her father   Daily caffeine use   Social Determinants of Health   Financial Resource Strain: Low Risk  (11/03/2021)   Overall Financial Resource Strain (CARDIA)  Difficulty of Paying Living Expenses: Not hard at all   Food Insecurity: No Food Insecurity (11/03/2021)   Hunger Vital Sign    Worried About Running Out of Food in the Last Year: Never true    Ran Out of Food in the Last Year: Never true  Transportation Needs: No Transportation Needs (11/03/2021)   PRAPARE - Hydrologist (Medical): No    Lack of Transportation (Non-Medical): No  Physical Activity: Sufficiently Active (11/03/2021)   Exercise Vital Sign    Days of Exercise per Week: 5 days    Minutes of Exercise per Session: 30 min  Stress: No Stress Concern Present (11/03/2021)   [place]    Feeling of Stress : Not at all  Social Connections: Moderately Integrated (11/03/2021)   Social Connection and Isolation Panel [NHANES]    Frequency of Communication with Friends and Family: More than three times a week    Frequency of Social Gatherings with Friends and Family: More than three times a week    Attends Religious Services: More than 4 times per year    Active Member of [employer] or Organizations: Yes    Attends Archivist Meetings: More than 4 times per year    Marital Status: Widowed     Family History: The patient's family history includes Cancer - Other in her brother; Colon polyps in her sister; Coronary artery disease in some other family members; Heart disease in her mother; Lung cancer in her brother; Pancreatic cancer in her mother. There is no history of Colon cancer, Esophageal cancer, Rectal cancer, or Stomach cancer.  ROS:   Please see the history of present illness.    All other systems reviewed and are negative.  EKGs/Labs/Other Studies Reviewed:    The following studies were reviewed today: Echocardiogram 01/15/2022:  1. Left ventricular ejection fraction, by estimation, is 60 to 65%. The  left ventricle has normal function. The left ventricle has no regional  wall motion abnormalities. There is mild concentric left  ventricular  hypertrophy. Left ventricular diastolic  parameters are consistent with Grade I diastolic dysfunction (impaired  relaxation). Elevated left ventricular end-diastolic pressure. The average  left ventricular global longitudinal strain is -12.8 %. The global  longitudinal strain is abnormal.   2. Right ventricular systolic function is normal. The right ventricular  size is normal. There is mildly elevated pulmonary artery systolic  pressure.   3. Left atrial size was mildly dilated.   4. The mitral valve is degenerative. Mild mitral valve regurgitation. No  evidence of mitral stenosis.   5. The aortic valve is tricuspid. Aortic valve regurgitation is mild.  Aortic valve sclerosis/calcification is present, without any evidence of  aortic stenosis.   6. Aortic Normal DTA.   7. The inferior vena cava is normal in size with greater than 50%  respiratory variability, suggesting right atrial pressure of 3 mmHg.   Cardiac catheterization 06/28/2021:   Mid LAD lesion is 30% stenosed.   Prox Cx to Mid Cx lesion is 30% stenosed.   Mid RCA lesion is 10% stenosed.   Prox LAD to Mid LAD lesion is 70% stenosed.   A drug-eluting stent was successfully placed using a STENT ONYX FRONTIER 3.0X18.   Post intervention, there is a 0% residual stenosis.   Patent mid RCA stents   Severe proximal LAD stenosis that angiographically appears moderate to severe. RFR assessment with pressure wire of 0.88.  Successful  PTCA/DES x 1 proximal LAD (RFR guided) Mild to moderate stenosis in the non-dominant mid Circumflex Large dominant RCA with patent mid stents without restenosis Normal LVEDP   Recommendations: Continue DAPT with ASA/Plavix. Continue beta blocker and statin.   EKG:  EKG is not ordered today.    Recent Labs: 12/21/2021: TSH 3.300 01/24/2022: BNP 55.3; Magnesium 2.1 03/05/2022: ALT 29 05/17/2022: BUN 17; Creatinine, Ser 1.01; Hemoglobin 13.2; Platelets 243; Potassium 4.0; Sodium 138   Recent Lipid Panel    Component Value Date/Time   CHOL 109 10/04/2021 0921   TRIG 110.0 10/04/2021 0921   TRIG 164 (H) 06/24/2006 0738   HDL 48.00 10/04/2021 0921   CHOLHDL 2 10/04/2021 0921   VLDL 22.0 10/04/2021 0921   LDLCALC 39 10/04/2021 0921   LDLDIRECT 42.0 09/12/2020 1516     Risk Assessment/Calculations:                Physical Exam:    VS:  BP 136/70   Pulse 66   Ht _0  (1.499 m)   Wt 121 lb 12.8 oz (55.2 kg)   SpO2 94%   BMI 24.60 kg/m     Wt Readings from Last 3 Encounters:  06/18/22 121 lb 12.8 oz (55.2 kg)  05/28/22 123 lb (55.8 kg)  05/18/22 124 lb 8 oz (56.5 kg)     GEN:  Well nourished, well developed elderly woman in no acute distress HEENT: Normal NECK: No JVD; No carotid bruits LYMPHATICS: No lymphadenopathy CARDIAC: RRR, no murmurs, rubs, gallops RESPIRATORY:  Clear to auscultation without rales, wheezing or rhonchi  ABDOMEN: Soft, non-tender, non-distended MUSCULOSKELETAL:  No edema; No deformity  SKIN: Warm and dry NEUROLOGIC:  Alert and oriented x 3 PSYCHIATRIC:  Normal affect   ASSESSMENT:    1. Essential hypertension   2. Mixed hyperlipidemia   3. Coronary artery disease of native artery of native heart with stable angina pectoris ([place])    PLAN:    In order of problems listed above:  Blood pressure is under ideal control on a combination of amlodipine, losartan, and metoprolol succinate.  Continue current therapy.  Most recent labs reviewed with a creatinine of 1.0 and potassium of 4.0. Treated with a high intensity statin drug (atorvastatin 80 mg) plus ezetimibe.  Recent lipids reviewed with a cholesterol of 109, HDL 48, LDL 39. Stable symptoms on current medical regimen.  Antianginal program includes amlodipine and metoprolol succinate.  Nitroglycerin is used on rare occasion.  Continues on dual antiplatelet therapy with history of multiple PCI procedures over time.  Seems to be tolerating all of this well.  Plan follow-up  in 6 months.           Medication Adjustments/Labs and Tests Ordered: Current medicines are reviewed at length with the patient today.  Concerns regarding medicines are outlined above.  No orders of the defined types were placed in this encounter.  No orders of the defined types were placed in this encounter.   Patient Instructions  Medication Instructions:  Your physician recommends that you continue on your current medications as directed. Please refer to the Current Medication list given to you today.  *If you need a refill on your cardiac medications before your next appointment, please call your pharmacy*   Lab Work: NONE If you have labs (blood work) drawn today and your tests are completely normal, you will receive your results only by: Plain (if you have MyChart) OR A paper copy in the mail If you have  any lab test that is abnormal or we need to change your treatment, we will call you to review the results.   Testing/Procedures: NONE   Follow-Up: At [facility], you and your health needs are our priority.  As part of our continuing mission to provide you with exceptional heart care, we have created designated Provider Care Teams.  These Care Teams include your primary Cardiologist (physician) and Advanced Practice Providers (APPs -  Physician Assistants and Nurse Practitioners) who all work together to provide you with the care you need, when you need it.  Your next appointment:   6 month(s)  The format for your next appointment:   In Person  Provider:   Sherren Mocha, MD  or APP     Important Information About Sugar         Signed, Sherren Mocha, MD  06/18/2022 5:22 PM    [place]

## 2022-06-19 ENCOUNTER — Ambulatory Visit: Payer: PPO | Admitting: Internal Medicine

## 2022-06-19 LAB — FUNGUS CULTURE RESULT

## 2022-06-19 LAB — FUNGUS CULTURE WITH STAIN

## 2022-06-19 LAB — FUNGAL ORGANISM REFLEX

## 2022-07-04 LAB — ACID FAST CULTURE WITH REFLEXED SENSITIVITIES (MYCOBACTERIA): Acid Fast Culture: NEGATIVE

## 2022-07-18 ENCOUNTER — Ambulatory Visit (HOSPITAL_BASED_OUTPATIENT_CLINIC_OR_DEPARTMENT_OTHER)
Admission: RE | Admit: 2022-07-18 | Discharge: 2022-07-18 | Disposition: A | Discharge status: Home / Self Care | Payer: Medicare HMO | Source: Ambulatory Visit | Attending: Pulmonary Disease | Admitting: Pulmonary Disease

## 2022-07-18 DIAGNOSIS — R911 Solitary pulmonary nodule: Secondary | ICD-10-CM | POA: Diagnosis present

## 2022-07-30 ENCOUNTER — Encounter (HOSPITAL_BASED_OUTPATIENT_CLINIC_OR_DEPARTMENT_OTHER): Payer: Self-pay | Admitting: Pulmonary Disease

## 2022-07-30 ENCOUNTER — Ambulatory Visit (INDEPENDENT_AMBULATORY_CARE_PROVIDER_SITE_OTHER): Payer: Medicare HMO | Admitting: Pulmonary Disease

## 2022-07-30 VITALS — BP 126/62 | HR 69 | Ht 59.0 in | Wt 122.4 lb

## 2022-07-30 DIAGNOSIS — R918 Other nonspecific abnormal finding of lung field: Secondary | ICD-10-CM | POA: Diagnosis not present

## 2022-07-30 NOTE — Progress Notes (Signed)
Subjective:   PATIENT ID: [name] [name] GENDER: female DOB: [birth year], MRN: [id number]   HPI  Chief Complaint  Patient presents with   Follow-up    CT results    Reason for Visit: Follow-up   [name] [name] is an 81 year old female never smoker with CAD s/p DES x 4 (last 2021), HTN, DM2, GERD, hx blood clots related to trauma s/p excision who presents for follow-up.  Initial consult She recently was seen by her PCP for fever and fatigue with no improvement on Z-pack. Dx with UTI however noted to have abnormal CXR leading to evaluation with CT Chest with demonstrated nodular density measuring 19 x 14 mm in the left apex concerning for malignancy. No mediastinal or hilar adenopathy.  At baseline she sometimes ambulate with a cane. Currently living independently and perform ADLs but is moving to a senior assistance for cleaning and meal assistance. Daughter reports she is sometimes a fall risk.  She walks up stairs slowly and will get winded. Denies cough, wheezing, hemoptysis, unintentional weight loss, night sweats or unexplained fevers/chills.  04/30/22 Daughter present to provide additional history. Reports intermittent low-grade fevers and fatigue. Denies significant weight loss or appetite change. Denies night sweats.  05/28/22 Daughter present and provides additional history with patient. Over two weeks ago she reports mechanical fall with left facial bruising and left knee swelling and bruising. Leg is tender with walking. She underwent navigational bronchoscopy on 9/14 which she tolerated well Denies shortness of breath, cough or wheezing. No unintentional weight loss, night sweats, unexplained fevers or chills.  07/30/22 Daughter presents. Recently had fever with UTI. Denies shortness of breath, cough or wheezing. No unintentional weight loss, change in appetite, night sweats.  Social History: Never smoker Significant second smoke exposure Mother with pancreatic  cancer Brother with lung cancer, heavy smoker Brother with laryngeal cancer, lived near [place]  Environmental exposures: Significant mold in apartment  Past Medical History:  Diagnosis Date   Allergy    Anal fissure    Anxiety    CAD (coronary artery disease)    s/p inf MI 2007 - tx w/ DES to RCA // s/p POBA to [facility] and DES to [place] in 11/18 ([facility] in [place], [place]) // Myoview 3/21: low risk // s/p DES to pRCA   Cataract    removed both eyes   Cerebrovascular disease    Carotid US 09/2017 [facility]): mild plaque (<50%) in both carotid arteries   Chronic neck pain    Chronic pain syndrome    Diabetes Mellitus, Type 2    Diverticular disease    DJD (degenerative joint disease)    Echocardiogram 10/2018    Echo 10/2018: EF 55-60, normal RVSF, mod MAC, mod TR, severe AoV calcification and sclerosis with nodular calcium/mobile area of calcium in the LVOT (small veg vs Lambl's excrescence  - consider TEE), mild AI, mild AS (mean 11).    Echocardiogram 04/2019    Echocardiogram 04/2019: EF 60-65, basal septal hypertrophy, grade 2 diastolic dysfunction, normal wall motion, normal RV SF, mild LAE, mod MAC, trivial MR, mod sclerosis of the aortic valve with mod aortic annular calcification, thin mobile filamentous structure on ventricular side of AV likely representing Lambl's excrescence, mild AI, mild TR   Frequent UTI's    Gastroesophageal reflux disease    H/O bacterial endocarditis    Rx w IV Abxs in 2020 ([facility] in [place], [place]) // F/u echo with AoV Lambl's excrescence   H/O hiatal  hernia    HTN (hypertension)    Hx of fall 10/2020   Hx of MI 2007   Hx of Stroke    IBS (irritable bowel syndrome)    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 (Cervical Fusion)   Premature ventricular contractions    Tubular adenoma of colon    Ulcer    [facility]   Urinary incontinence      Family History  Problem Relation Age of Onset   Heart disease Mother    Pancreatic cancer Mother    Lung cancer Brother    Cancer - Other Brother        vocal cord cancer   Colon polyps Sister    Coronary artery disease Other        sibling   Coronary artery disease Other        sibling   Colon cancer Neg Hx    Esophageal cancer Neg Hx    Rectal cancer Neg Hx    Stomach cancer Neg Hx      Social History   Occupational History    Employer: RETIRED  Tobacco Use   Smoking status: Never    Passive exposure: Never   Smokeless tobacco: Never  Vaping Use   Vaping Use: Never used  Substance and Sexual Activity   Alcohol use: No   Drug use: No   Sexual activity: Yes    Partners: Male    Allergies  Allergen Reactions   Macrobid [Nitrofurantoin] Nausea Only    Severe nausea   Sulfa Antibiotics Itching   Niacin And Related Other (See Comments)    Must take "Flush-free"   Codeine Nausea Only   Prednisone Itching and Rash   Rocephin [Ceftriaxone] Itching   Sulfonamide Derivatives Itching   Tetracycline Itching and Rash     Outpatient Medications Prior to Visit  Medication Sig Dispense Refill   acetaminophen (TYLENOL) 325 MG tablet Take 650 mg by mouth every 6 (six) hours as needed for moderate pain.     amLODipine (NORVASC) 5 MG tablet Take 1 tablet (5 mg total) by mouth daily. 90 tablet 3   amoxicillin-clavulanate (AUGMENTIN) 500-125 MG tablet Take 1 tablet by mouth 2 (two) times daily.     Ascorbic Acid (VITAMIN C) 500 MG CAPS Take 500 mg by mouth in the morning and at bedtime.     aspirin EC 81 MG tablet Take 81 mg by mouth every morning.      atorvastatin (LIPITOR) 80 MG tablet TAKE ONE TABLET BY MOUTH EVERYDAY AT BEDTIME 90 tablet 1   Cinnamon 500 MG TABS Take 1,000  mg by mouth daily.     clopidogrel (PLAVIX) 75 MG tablet TAKE ONE TABLET BY MOUTH EVERY MORNING 90 tablet 1   dicyclomine (BENTYL) 10 MG capsule Take 1 capsule (10 mg total) by mouth 4 (four) times daily. 360 capsule 0   ezetimibe (ZETIA) 10 MG tablet TAKE ONE TABLET BY MOUTH EVERY MORNING 90 tablet 1   gabapentin (NEURONTIN) 300 MG capsule TAKE TWO CAPSULES BY MOUTH EVERY MORNING and TAKE TWO CAPSULES BY MOUTH EVERYDAY AT BEDTIME 360 capsule  1   Krill Oil 1000 MG CAPS Take 1,000 mg by mouth daily.     losartan (COZAAR) 25 MG tablet Take 1 tablet (25 mg total) by mouth daily. 90 tablet 3   magnesium oxide (MAG-OX) 400 MG tablet Take 400 mg by mouth daily.     metoprolol succinate (TOPROL-XL) 50 MG 24 hr tablet Take 1.5 tablets (75 mg total) by mouth every morning. 135 tablet 3   Misc Natural Products (FOCUSED MIND PO) Take 15 mg by mouth daily.     nitroGLYCERIN (NITROSTAT) 0.4 MG SL tablet DISSOLVE 1 TABLET UNDER THE TONGUE EVERY 5 MINUTES AS NEEDED FOR CHEST PAIN. DO NOT EXCEED A TOTAL OF 3 DOSES IN 15 MINUTES. 25 tablet 2   Omega-3 Fatty Acids (FISH OIL) 1000 MG CAPS Take 1,000 mg by mouth in the morning and at bedtime.     pantoprazole (PROTONIX) 40 MG tablet Take 1 tablet (40 mg total) by mouth daily. 90 tablet 1   Psyllium (DAILY FIBER PO) Take 1 capsule by mouth daily.     TURMERIC PO Take 1,000 mg by mouth in the morning and at bedtime.     venlafaxine XR (EFFEXOR-XR) 37.5 MG 24 hr capsule TAKE ONE TABLET BY MOUTH EVERY MORNING 90 capsule 1   vitamin E 1000 UNIT capsule Take 1,000 Units by mouth in the morning and at bedtime.     No facility-administered medications prior to visit.    Review of Systems  Constitutional:  Negative for chills, diaphoresis, fever, malaise/fatigue and weight loss.  HENT:  Negative for congestion.   Respiratory:  Negative for cough, hemoptysis, sputum production, shortness of breath and wheezing.   Cardiovascular:  Negative for chest pain, palpitations and  leg swelling.     Objective:   Vitals:   07/30/22 1329  BP: 126/62  Pulse: 69  SpO2: 96%  Weight: 122 lb 6.4 oz (55.5 kg)  Height: _0  (1.499 m)   SpO2: 96 % O2 Device: None (Room air)  Physical Exam: General: Elderly, frail-appearing, no acute distress HENT: [place], AT, OP clear, MMM Eyes: EOMI, no scleral icterus Respiratory: Clear to auscultation bilaterally.  No crackles, wheezing or rales Cardiovascular: RRR, -M/R/G, no JVD Extremities:-Edema,-tenderness Neuro: AAO x4, CNII-XII grossly intact  Data Reviewed:  Imaging: CT Chest 03/22/22 - nodular density measuring 19 x 14 mm in the left apex. No hilar or mediastinal adenopathy PET 6/96/78 - Hypermetabolic 1.8 cm LUL apical lung nodule. No adenopathy or mets. CT Chest 07/18/22 - unchanged LUL apical nodule. Few basilar nodules developed in the interval in bases  PFT: None on file  Labs: CBC    Component Value Date/Time   WBC 8.0 05/17/2022 0743   RBC 4.21 05/17/2022 0743   HGB 13.2 05/17/2022 0743   HGB 14.3 12/21/2021 1538   HCT 41.2 05/17/2022 0743   HCT 42.7 12/21/2021 1538   PLT 243 05/17/2022 0743   PLT 239 12/21/2021 1538   MCV 97.9 05/17/2022 0743   MCV 94 12/21/2021 1538   MCH 31.4 05/17/2022 0743   MCHC 32.0 05/17/2022 0743   RDW 13.0 05/17/2022 0743   RDW 12.3 12/21/2021 1538   LYMPHSABS 2.1 03/05/2022 0841   MONOABS 0.7 03/05/2022 0841   EOSABS 0.2 03/05/2022 0841   BASOSABS 0.1 03/05/2022 0841   Absolute 03/05/22 - 200     Assessment & Plan:   Discussion: 81 year old female never smoker with CAD s/p DES x 4 (last 2021), HTN, DM2, GERD and hx  blood clots related to trauma s/p excision who presents for follow-up. Bronchoscopy 05/17/22 for PET avid LUL nodule measuring 1.8cm. Cytology from [facility], brushing and BAL of LUL neg. Cultures negative. Despite no evidence of malignancy on biopsy, concerned for false negative given the PET-avidity of nodule. Recommend surveillance imaging and potential  repeat biopsy if lesion demonstrates enlargement in the future  PET-avid left upper lobe lung nodule measuring 1.8 cm --Prior bronchoscopy. No malignancy however recommend continued surveillance based on PET-avidity and size of lung --CT chest reviewed. Stable lungs. Small opacities in bases, not significant clinically --ORDER CT Chest without contrast in 4 months  LLE swelling - resolved Hx DVT related to falls in the past --Conservative measures including walking  Health Maintenance Immunization History  Administered Date(s) Administered   Fluad Quad(high Dose 65+) 04/30/2019, 06/27/2020, 05/12/2021, 07/09/2022   Influenza Split 07/18/2011, 05/19/2012   Influenza Whole 06/22/2009, 05/15/2010   Influenza, High Dose Seasonal PF 07/20/2015, 05/24/2016, 06/18/2018   Influenza,inj,Quad PF,6+ Mos 06/15/2013, 05/21/2014   Influenza-Unspecified 06/24/2017   Pneumococcal Conjugate-13 01/20/2014   Pneumococcal Polysaccharide-23 11/08/2014, 11/13/2017   Tdap 01/20/2014, 05/11/2022   Zoster, Live 07/08/2014   CT Lung Screen - not qualified due to age  Orders Placed This Encounter  Procedures   CT Chest Wo Contrast    Standing Status:   Future    Standing Expiration Date:   07/31/2023    Scheduling Instructions:     To be completed in 66mobefore ov    Order Specific Question:   Preferred imaging location?    Answer:   [facility]  No orders of the defined types were placed in this encounter.   Return in about 4 months (around 11/28/2022).   I have spent a total time of 32-minutes on the day of the appointment including chart review, data review, collecting history, coordinating care and discussing medical diagnosis and plan with the patient/family. Past medical history, allergies, medications were reviewed. Pertinent imaging, labs and tests included in this note have been reviewed and interpreted independently by me.  [place] MD [facility] 07/30/2022  Office Number [phone]

## 2022-07-30 NOTE — Patient Instructions (Signed)
PET-avid left upper lobe lung nodule measuring 1.8 cm --Prior bronchoscopy. No malignancy however recommend continued surveillance based on PET-avidity and size of lung --CT chest reviewed. Stable lungs. Small opacities in bases, not significant clinically --ORDER CT Chest without contrast in 4 months  LLE swelling - resolved Hx DVT related to falls in the past --Conservative measures including walking  Follow-up with me in 4 months after CT scan

## 2022-08-06 ENCOUNTER — Other Ambulatory Visit: Payer: Self-pay | Admitting: Internal Medicine

## 2022-08-06 DIAGNOSIS — M542 Cervicalgia: Secondary | ICD-10-CM

## 2022-08-06 DIAGNOSIS — F418 Other specified anxiety disorders: Secondary | ICD-10-CM

## 2022-08-06 DIAGNOSIS — K2100 Gastro-esophageal reflux disease with esophagitis, without bleeding: Secondary | ICD-10-CM

## 2022-08-06 DIAGNOSIS — E785 Hyperlipidemia, unspecified: Secondary | ICD-10-CM

## 2022-08-06 DIAGNOSIS — I2510 Atherosclerotic heart disease of native coronary artery without angina pectoris: Secondary | ICD-10-CM

## 2022-08-09 ENCOUNTER — Encounter (HOSPITAL_BASED_OUTPATIENT_CLINIC_OR_DEPARTMENT_OTHER): Payer: Self-pay | Admitting: Pulmonary Disease

## 2022-08-14 ENCOUNTER — Other Ambulatory Visit: Payer: Self-pay | Admitting: Internal Medicine

## 2022-08-14 DIAGNOSIS — K2100 Gastro-esophageal reflux disease with esophagitis, without bleeding: Secondary | ICD-10-CM

## 2022-08-16 ENCOUNTER — Other Ambulatory Visit: Payer: Self-pay | Admitting: Internal Medicine

## 2022-08-16 DIAGNOSIS — K2100 Gastro-esophageal reflux disease with esophagitis, without bleeding: Secondary | ICD-10-CM

## 2022-09-26 ENCOUNTER — Encounter (HOSPITAL_COMMUNITY): Payer: Self-pay

## 2022-09-26 ENCOUNTER — Emergency Department (HOSPITAL_BASED_OUTPATIENT_CLINIC_OR_DEPARTMENT_OTHER): Payer: Medicare HMO

## 2022-09-26 ENCOUNTER — Encounter (HOSPITAL_BASED_OUTPATIENT_CLINIC_OR_DEPARTMENT_OTHER): Payer: Self-pay

## 2022-09-26 ENCOUNTER — Emergency Department (HOSPITAL_BASED_OUTPATIENT_CLINIC_OR_DEPARTMENT_OTHER): Payer: Medicare HMO | Admitting: Radiology

## 2022-09-26 ENCOUNTER — Inpatient Hospital Stay (HOSPITAL_BASED_OUTPATIENT_CLINIC_OR_DEPARTMENT_OTHER)
Admission: EM | Admit: 2022-09-26 | Discharge: 2022-09-29 | DRG: 641 | Disposition: A | Discharge status: Home Health | Payer: Medicare HMO | Attending: Internal Medicine | Admitting: Internal Medicine

## 2022-09-26 ENCOUNTER — Other Ambulatory Visit: Payer: Self-pay

## 2022-09-26 DIAGNOSIS — E119 Type 2 diabetes mellitus without complications: Secondary | ICD-10-CM | POA: Diagnosis present

## 2022-09-26 DIAGNOSIS — D689 Coagulation defect, unspecified: Secondary | ICD-10-CM | POA: Diagnosis present

## 2022-09-26 DIAGNOSIS — Z8673 Personal history of transient ischemic attack (TIA), and cerebral infarction without residual deficits: Secondary | ICD-10-CM

## 2022-09-26 DIAGNOSIS — Z800 Family history of malignant neoplasm of digestive organs: Secondary | ICD-10-CM

## 2022-09-26 DIAGNOSIS — Z802 Family history of malignant neoplasm of other respiratory and intrathoracic organs: Secondary | ICD-10-CM

## 2022-09-26 DIAGNOSIS — Z801 Family history of malignant neoplasm of trachea, bronchus and lung: Secondary | ICD-10-CM

## 2022-09-26 DIAGNOSIS — K859 Acute pancreatitis without necrosis or infection, unspecified: Secondary | ICD-10-CM

## 2022-09-26 DIAGNOSIS — R630 Anorexia: Secondary | ICD-10-CM | POA: Diagnosis present

## 2022-09-26 DIAGNOSIS — E1142 Type 2 diabetes mellitus with diabetic polyneuropathy: Secondary | ICD-10-CM | POA: Diagnosis present

## 2022-09-26 DIAGNOSIS — N1832 Chronic kidney disease, stage 3b: Secondary | ICD-10-CM | POA: Diagnosis present

## 2022-09-26 DIAGNOSIS — Z7982 Long term (current) use of aspirin: Secondary | ICD-10-CM

## 2022-09-26 DIAGNOSIS — F418 Other specified anxiety disorders: Secondary | ICD-10-CM | POA: Diagnosis present

## 2022-09-26 DIAGNOSIS — Z885 Allergy status to narcotic agent status: Secondary | ICD-10-CM

## 2022-09-26 DIAGNOSIS — R918 Other nonspecific abnormal finding of lung field: Secondary | ICD-10-CM | POA: Diagnosis present

## 2022-09-26 DIAGNOSIS — Z96652 Presence of left artificial knee joint: Secondary | ICD-10-CM | POA: Diagnosis present

## 2022-09-26 DIAGNOSIS — I2510 Atherosclerotic heart disease of native coronary artery without angina pectoris: Secondary | ICD-10-CM | POA: Diagnosis present

## 2022-09-26 DIAGNOSIS — E871 Hypo-osmolality and hyponatremia: Secondary | ICD-10-CM | POA: Diagnosis not present

## 2022-09-26 DIAGNOSIS — Z7902 Long term (current) use of antithrombotics/antiplatelets: Secondary | ICD-10-CM

## 2022-09-26 DIAGNOSIS — E1122 Type 2 diabetes mellitus with diabetic chronic kidney disease: Secondary | ICD-10-CM | POA: Diagnosis present

## 2022-09-26 DIAGNOSIS — E118 Type 2 diabetes mellitus with unspecified complications: Secondary | ICD-10-CM | POA: Diagnosis present

## 2022-09-26 DIAGNOSIS — N3000 Acute cystitis without hematuria: Secondary | ICD-10-CM | POA: Diagnosis present

## 2022-09-26 DIAGNOSIS — Z882 Allergy status to sulfonamides status: Secondary | ICD-10-CM

## 2022-09-26 DIAGNOSIS — E782 Mixed hyperlipidemia: Secondary | ICD-10-CM | POA: Diagnosis present

## 2022-09-26 DIAGNOSIS — Z955 Presence of coronary angioplasty implant and graft: Secondary | ICD-10-CM

## 2022-09-26 DIAGNOSIS — R5383 Other fatigue: Secondary | ICD-10-CM

## 2022-09-26 DIAGNOSIS — Z888 Allergy status to other drugs, medicaments and biological substances status: Secondary | ICD-10-CM

## 2022-09-26 DIAGNOSIS — Z8249 Family history of ischemic heart disease and other diseases of the circulatory system: Secondary | ICD-10-CM

## 2022-09-26 DIAGNOSIS — R748 Abnormal levels of other serum enzymes: Secondary | ICD-10-CM | POA: Diagnosis present

## 2022-09-26 DIAGNOSIS — K219 Gastro-esophageal reflux disease without esophagitis: Secondary | ICD-10-CM | POA: Diagnosis present

## 2022-09-26 DIAGNOSIS — I10 Essential (primary) hypertension: Secondary | ICD-10-CM | POA: Diagnosis present

## 2022-09-26 DIAGNOSIS — B9620 Unspecified Escherichia coli [E. coli] as the cause of diseases classified elsewhere: Secondary | ICD-10-CM | POA: Diagnosis present

## 2022-09-26 DIAGNOSIS — E785 Hyperlipidemia, unspecified: Secondary | ICD-10-CM | POA: Diagnosis present

## 2022-09-26 DIAGNOSIS — I129 Hypertensive chronic kidney disease with stage 1 through stage 4 chronic kidney disease, or unspecified chronic kidney disease: Secondary | ICD-10-CM | POA: Diagnosis present

## 2022-09-26 DIAGNOSIS — Z79899 Other long term (current) drug therapy: Secondary | ICD-10-CM

## 2022-09-26 DIAGNOSIS — Z66 Do not resuscitate: Secondary | ICD-10-CM | POA: Diagnosis present

## 2022-09-26 DIAGNOSIS — Z9049 Acquired absence of other specified parts of digestive tract: Secondary | ICD-10-CM

## 2022-09-26 DIAGNOSIS — G894 Chronic pain syndrome: Secondary | ICD-10-CM | POA: Diagnosis present

## 2022-09-26 LAB — BASIC METABOLIC PANEL
Anion gap: 4 — ABNORMAL LOW (ref 5–15)
BUN: 14 mg/dL (ref 8–23)
CO2: 34 mmol/L — ABNORMAL HIGH (ref 22–32)
Calcium: 8.6 mg/dL — ABNORMAL LOW (ref 8.9–10.3)
Chloride: 83 mmol/L — ABNORMAL LOW (ref 98–111)
Creatinine, Ser: 0.59 mg/dL (ref 0.44–1.00)
GFR, Estimated: >60 mL/min (ref >60)
Glucose, Bld: 170 mg/dL — ABNORMAL HIGH (ref 70–99)
Potassium: 3.4 mmol/L — ABNORMAL LOW (ref 3.5–5.1)
Sodium: 121 mmol/L — ABNORMAL LOW (ref 135–145)

## 2022-09-26 LAB — CBC
HCT: 40.7 % (ref 36.0–46.0)
Hemoglobin: 14.4 g/dL (ref 12.0–15.0)
MCH: 30.8 pg (ref 26.0–34.0)
MCHC: 35.4 g/dL (ref 30.0–36.0)
MCV: 87.2 fL (ref 80.0–100.0)
Platelets: 282 10*3/uL (ref 150–400)
RBC: 4.67 MIL/uL (ref 3.87–5.11)
RDW: 12.2 % (ref 11.5–15.5)
WBC: 7.5 10*3/uL (ref 4.0–10.5)
nRBC: 0 % (ref 0.0–0.2)

## 2022-09-26 LAB — COMPREHENSIVE METABOLIC PANEL
ALT: 27 U/L (ref 0–44)
AST: 27 U/L (ref 15–41)
Albumin: 4.3 g/dL (ref 3.5–5.0)
Alkaline Phosphatase: 49 U/L (ref 38–126)
Anion gap: 8 (ref 5–15)
BUN: 15 mg/dL (ref 8–23)
CO2: 32 mmol/L (ref 22–32)
Calcium: 9.3 mg/dL (ref 8.9–10.3)
Chloride: 78 mmol/L — ABNORMAL LOW (ref 98–111)
Creatinine, Ser: 0.73 mg/dL (ref 0.44–1.00)
GFR, Estimated: >60 mL/min (ref >60)
Glucose, Bld: 149 mg/dL — ABNORMAL HIGH (ref 70–99)
Potassium: 4 mmol/L (ref 3.5–5.1)
Sodium: 118 mmol/L — CL (ref 135–145)
Total Bilirubin: 0.6 mg/dL (ref 0.3–1.2)
Total Protein: 7.3 g/dL (ref 6.5–8.1)

## 2022-09-26 LAB — URINALYSIS, ROUTINE W REFLEX MICROSCOPIC
Bilirubin Urine: NEGATIVE
Glucose, UA: NEGATIVE mg/dL
Hgb urine dipstick: NEGATIVE
Ketones, ur: NEGATIVE mg/dL
Nitrite: POSITIVE — AB
Protein, ur: NEGATIVE mg/dL
Specific Gravity, Urine: 1.008 (ref 1.005–1.030)
pH: 7 (ref 5.0–8.0)

## 2022-09-26 LAB — LIPASE, BLOOD: Lipase: 104 U/L — ABNORMAL HIGH (ref 11–51)

## 2022-09-26 LAB — TSH: TSH: 2.792 u[IU]/mL (ref 0.350–4.500)

## 2022-09-26 LAB — MAGNESIUM: Magnesium: 1.8 mg/dL (ref 1.7–2.4)

## 2022-09-26 MED ORDER — SODIUM CHLORIDE 0.9 % IV BOLUS
500.0000 mL | Freq: Once | INTRAVENOUS | Status: AC
  Administered 2022-09-26: 500 mL via INTRAVENOUS

## 2022-09-26 NOTE — ED Notes (Signed)
Pt awake and alert lying in bed - GCS 15.  RR even and unlabored on RA with symmetrical rise and fall of chest.  Continuous cardiac and pulse ox maintained.  Pt denies pain at this time - c/o only feeling fatigued.  Skin warm dry and intact.  Pt denies any immediate needs questions concerns at this time.  Call bell in reach.  Will monitor for acute changes and maintain plan of care as pt awaits bed assignment.

## 2022-09-26 NOTE — Plan of Care (Addendum)
82 year old female with new onset hyponatremia likely due to poor p.o. intake sodium of 118 complains of nausea dizziness and generalized weakness.  No prior history of hyponatremia.  Not on any medications that could cause hyponatremia.  In the ED she received normal saline.  Workup shows lipase more than 100.  CT head negative.  UA consistent with UTI though she has no symptoms of dysuria.  Urine culture sent.  Vital signs stable.  She is being admitted to stepdown with severe hyponatremia. Asked ED physician to call nephrology prior to transfer.

## 2022-09-26 NOTE — ED Notes (Signed)
Pt assisted to and from hall bathroom by this nurse; pt noted to be mildly unsteady with ambulation.  Denied dizziness earlier however now states has been having intermittent dizziness.  Pt now back in bed resting comfortably in dimly lit room - denies any addl questions concerns needs at this time; continues to await bed assignment.  This nurse will continue to maintain plan of care and monitor for acute changes/distress

## 2022-09-26 NOTE — ED Triage Notes (Signed)
Patient here POV from Home.  Endorses Headache, Nausea for approximately 1 week.   No Emesis or Diarrhea. No Pain. No SOB.  Seen by PCP yesterday and had Specimens collected with were Abnormal. Sodium: 120, Chloride: 80.   NAD Noted during Triage. A&Ox4. GCS 15. BIB Wheelchair.

## 2022-09-26 NOTE — ED Provider Notes (Signed)
[name] [name]   CSN: [id number] Arrival date & time: 09/26/22  1028     History  Chief Complaint  Patient presents with   Abnormal Lab    [name] [name] is a 82 y.o. female.  The history is provided by the patient, medical records and the spouse. No language interpreter was used.  Abnormal Lab Time since result:  Yesterday Patient referred by:  PCP Resulting agency:  External Result type: chemistry   Chemistry:    Sodium:  Low      Home Medications Prior to Admission medications   Medication Sig Start Date End Date Taking? Authorizing Provider  acetaminophen (TYLENOL) 325 MG tablet Take 650 mg by mouth every 6 (six) hours as needed for moderate pain.    [provider]  amLODipine (NORVASC) 5 MG tablet Take 1 tablet (5 mg total) by mouth daily. 11/07/21   Hoyt Koch, MD  amoxicillin-clavulanate (AUGMENTIN) 500-125 MG tablet Take 1 tablet by mouth 2 (two) times daily. 07/23/22   [provider]  Ascorbic Acid (VITAMIN C) 500 MG CAPS Take 500 mg by mouth in the morning and at bedtime.    [provider]  aspirin EC 81 MG tablet Take 81 mg by mouth every morning.     [provider]  atorvastatin (LIPITOR) 80 MG tablet TAKE ONE TABLET BY MOUTH EVERYDAY AT BEDTIME 08/06/22   Janith Lima, MD  Cinnamon 500 MG TABS Take 1,000 mg by mouth daily.    [provider]  clopidogrel (PLAVIX) 75 MG tablet TAKE ONE CAPSULE BY MOUTH EVERY MORNING 08/06/22   Janith Lima, MD  dicyclomine (BENTYL) 10 MG capsule Take 1 capsule (10 mg total) by mouth 4 (four) times daily. 05/18/22   Janith Lima, MD  ezetimibe (ZETIA) 10 MG tablet TAKE ONE TABLET BY MOUTH EVERY MORNING 08/06/22   Janith Lima, MD  gabapentin (NEURONTIN) 300 MG capsule TAKE TWO CAPSULES BY MOUTH EVERY MORNING and TAKE TWO CAPSULES BY MOUTH EVERYDAY AT BEDTIME 08/06/22   Janith Lima, MD  Krill Oil 1000 MG  CAPS Take 1,000 mg by mouth daily.    [provider]  losartan (COZAAR) 25 MG tablet Take 1 tablet (25 mg total) by mouth daily. 02/12/22   Loel Dubonnet, NP  magnesium oxide (MAG-OX) 400 MG tablet Take 400 mg by mouth daily.    [provider]  metoprolol succinate (TOPROL-XL) 50 MG 24 hr tablet Take 1.5 tablets (75 mg total) by mouth every morning. 02/06/22   Loel Dubonnet, NP  Misc Natural Products (FOCUSED MIND PO) Take 15 mg by mouth daily.    [provider]  nitroGLYCERIN (NITROSTAT) 0.4 MG SL tablet DISSOLVE 1 TABLET UNDER THE TONGUE EVERY 5 MINUTES AS NEEDED FOR CHEST PAIN. DO NOT EXCEED A TOTAL OF 3 DOSES IN 15 MINUTES. 11/29/21   Janith Lima, MD  Omega-3 Fatty Acids (FISH OIL) 1000 MG CAPS Take 1,000 mg by mouth in the morning and at bedtime.    [provider]  pantoprazole (PROTONIX) 40 MG tablet TAKE ONE TABLET BY MOUTH ONCE DAILY 08/06/22   Janith Lima, MD  Psyllium (DAILY FIBER PO) Take 1 capsule by mouth daily.    [provider]  TURMERIC PO Take 1,000 mg by mouth in the morning and at bedtime.    [provider]  venlafaxine XR (EFFEXOR-XR) 37.5 MG 24 hr capsule TAKE ONE  CAPSULE BY MOUTH EVERY MORNING 08/06/22   Janith Lima, MD  vitamin E 1000 UNIT capsule Take 1,000 Units by mouth in the morning and at bedtime.    [provider]      Allergies    Macrobid [nitrofurantoin], Sulfa antibiotics, Niacin and related, Codeine, Prednisone, Rocephin [ceftriaxone], Sulfonamide derivatives, and Tetracycline    Review of Systems   Review of Systems  Constitutional:  Positive for fatigue. Negative for chills and fever.  HENT:  Positive for congestion.   Eyes:  Negative for visual disturbance.  Respiratory:  Negative for cough, chest tightness, shortness of breath, wheezing and stridor.   Cardiovascular:  Negative for chest pain and palpitations.  Gastrointestinal:  Positive for nausea. Negative for abdominal  pain, constipation and vomiting.  Genitourinary:  Negative for dysuria.  Musculoskeletal:  Negative for back pain, neck pain and neck stiffness.  Skin:  Negative for rash and wound.  Neurological:  Positive for headaches. Negative for weakness, light-headedness and numbness.  Psychiatric/Behavioral:  Negative for agitation and confusion.   All other systems reviewed and are negative.   Physical Exam Updated Vital Signs BP (!) 187/69 (BP Location: Right Arm)   Pulse 69   Temp 97.8 F (36.6 C)   Resp 18   Ht '4\' 11"'$  (1.499 m)   Wt 55.5 kg   SpO2 94%   BMI 24.71 kg/m  Physical Exam Vitals and nursing [name] reviewed.  Constitutional:      General: She is not in acute distress.    Appearance: She is well-developed. She is not ill-appearing, toxic-appearing or diaphoretic.  HENT:     Head: Normocephalic and atraumatic.     Nose: Nose normal.     Mouth/Throat:     Mouth: Mucous membranes are dry.     Pharynx: No oropharyngeal exudate or posterior oropharyngeal erythema.  Eyes:     Extraocular Movements: Extraocular movements intact.     Conjunctiva/sclera: Conjunctivae normal.     Pupils: Pupils are equal, round, and reactive to light.  Cardiovascular:     Rate and Rhythm: Normal rate and regular rhythm.     Heart sounds: No murmur heard. Pulmonary:     Effort: Pulmonary effort is normal. No respiratory distress.     Breath sounds: Normal breath sounds. No wheezing, rhonchi or rales.  Chest:     Chest wall: No tenderness.  Abdominal:     General: Abdomen is flat. There is no distension.     Palpations: Abdomen is soft.     Tenderness: There is no abdominal tenderness. There is no right CVA tenderness, left CVA tenderness, guarding or rebound.  Musculoskeletal:        General: No swelling or tenderness.     Cervical back: Neck supple. No tenderness.     Right lower leg: No edema.     Left lower leg: No edema.  Skin:    General: Skin is warm and dry.     Capillary Refill:  Capillary refill takes less than 2 seconds.     Findings: No erythema or rash.  Neurological:     General: No focal deficit present.     Mental Status: She is alert.     Sensory: No sensory deficit.     Motor: No weakness.  Psychiatric:        Mood and Affect: Mood normal.     ED Results / Procedures / Treatments   Labs (all labs ordered are listed, but only abnormal results are  displayed) Labs Reviewed  COMPREHENSIVE METABOLIC PANEL - Abnormal; Notable for the following components:      Result Value   Sodium 118 (*)    Chloride 78 (*)    Glucose, Bld 149 (*)    All other components within normal limits  URINALYSIS, ROUTINE W REFLEX MICROSCOPIC - Abnormal; Notable for the following components:   APPearance HAZY (*)    Nitrite POSITIVE (*)    Leukocytes,Ua MODERATE (*)    Bacteria, UA FEW (*)    All other components within normal limits  LIPASE, BLOOD - Abnormal; Notable for the following components:   Lipase 104 (*)    All other components within normal limits  CBC  MAGNESIUM  TSH  URINALYSIS, W/ REFLEX TO CULTURE (INFECTION SUSPECTED)    EKG None  Radiology DG Chest 2 View  Result Date: 09/26/2022 CLINICAL DATA:  Fatigue, hyponatremia EXAM: CHEST - 2 VIEW COMPARISON:  05/17/2022 FINDINGS: Elevation of the right diaphragm unchanged from the prior exam. No focal consolidation. No pleural effusion or pneumothorax. Heart and mediastinal contours are unremarkable. No acute osseous abnormality. IMPRESSION: No active cardiopulmonary disease. Electronically Signed   By: Kathreen Devoid M.D.   On: 09/26/2022 13:11   CT Head Wo Contrast  Result Date: 09/26/2022 CLINICAL DATA:  Head trauma, minor (Age >= 65y) Fall, headache, electrolyte abnormalities EXAM: CT HEAD WITHOUT CONTRAST TECHNIQUE: Contiguous axial images were obtained from the base of the skull through the vertex without intravenous contrast. RADIATION DOSE REDUCTION: This exam was performed according to the departmental  dose-optimization program which includes automated exposure control, adjustment of the mA and/or kV according to patient size and/or use of iterative reconstruction technique. COMPARISON:  CT head 05/02/2021. FINDINGS: Brain: No evidence of acute large vascular territory infarction, hemorrhage, hydrocephalus, extra-axial collection or mass lesion/mass effect. Moderate patchy white matter hypodensities, nonspecific but compatible with chronic microvascular ischemic disease. Vascular: Calcific atherosclerosis. No hyperdense vessel identified. Skull: No acute fracture. Sinuses/Orbits: Visualized sinuses are clear. No acute orbital findings. Other: No mastoid effusions. IMPRESSION: 1. No evidence of acute intracranial abnormality. 2. Moderate chronic microvascular ischemic disease. Electronically Signed   By: Margaretha Sheffield M.D.   On: 09/26/2022 12:22    Procedures Procedures    CRITICAL CARE Performed by: [name] [name] Total critical care time: 30 minutes Critical care time was exclusive of separately billable procedures and treating other patients. Critical care was necessary to treat or prevent imminent or life-threatening deterioration. Critical care was time spent personally by me on the following activities: development of treatment plan with patient and/or surrogate as well as nursing, discussions with consultants, evaluation of patient's response to treatment, examination of patient, obtaining history from patient or surrogate, ordering and performing treatments and interventions, ordering and review of laboratory studies, ordering and review of radiographic studies, pulse oximetry and re-evaluation of patient's condition.   Medications Ordered in ED Medications  sodium chloride 0.9 % bolus 500 mL (500 mLs Intravenous New Bag/Given 09/26/22 1459)    ED Course/ Medical Decision Making/ A&P                             Medical Decision Making Amount and/or Complexity of Data  Reviewed Labs: ordered. Radiology: ordered.  Risk Decision regarding hospitalization.    YSABEL STANKOVICH is a 82 y.o. female with a past medical history significant for hypertension, hyperlipidemia, diabetes, CAD status post PCI, previous bacterial endocarditis, previous cholecystectomy, previous appendectomy,  and chronic pain who presents for 1 week of headaches, nausea, decreased appetite, fatigue, and abnormal lab yesterday.  According to patient, for the last week or so she has had headaches, fatigue, nausea and is felt very drained and tired.  She reports she went to her PCP who did labs on her yesterday and found her sodium to be low at 120.  She was sent here to the emergency department for symptomatic hyponatremia for further evaluation and management.  She reports he did have a fall and may have hit her head but is not certain if that caused her symptoms or not.  She reports no constipation, diarrhea, or urinary changes.  No fevers, chills, congestion, or cough reported.  On exam, lungs clear.  Chest nontender.  Abdomen nontender.  No focal neurologic deficits initially with intact sensation, strength, and pulses in extremities.  Symmetric smile.  Clear speech.  Pupils are symmetric and reactive with normal extraocular movements.  Mucous membranes do appear slightly dry on exam.  No evidence of acute head trauma initially.  Given the fatigue, headache and nausea with possible lab abnormality, her symptoms could be due to symptomatic hyponatremia or dehydration.  We will get some screening labs will also get head CT, labs, urinalysis, and chest x-ray to look for occult infection contributing to symptoms.  Patient's labs began to return and she does indeed have hyponatremia with a sodium of 118 and a chloride of 78.  Glucose was slightly elevated at 149 but otherwise her kidney function and liver function appeared normal.  CBC reassuring.  Lipase slightly elevated at 104, unclear if this is  some mild pancreatitis.  Magnesium normal.  TSH normal.   Still waiting on urinalysis and chest x-ray results.  CT head Showed no acute intracranial abnormality and some chronic microvascular changes.  At this reassessment of patient but due to her electrolyte abnormalities and symptoms anticipate she may need admission for rehydration and sodium monitoring.  2:22 PM Patient still feeling fatigued and lightheaded and nauseous.  Will give some fluids and some nausea medicine and will call for admission for symptomatic hyponatremia leading to fatigue, lightheadedness, headaches, and nausea.  Unclear if this is from mild pancreatitis leading to the nausea and decreased oral intake.  3:11 PM Urinalysis does show evidence of UTI.  Spoke to hospitalist and they request oral Keflex to treat.  Will give a dose and patient will be admitted per their recommendations.  Culture was added as well.        Final Clinical Impression(s) / ED Diagnoses Final diagnoses:  Hyponatremia  Fatigue, unspecified type  Acute pancreatitis, unspecified complication status, unspecified pancreatitis type     Clinical Impression: 1. Hyponatremia   2. Fatigue, unspecified type   3. Acute pancreatitis, unspecified complication status, unspecified pancreatitis type     Disposition: Admit  This [name] was prepared with assistance of Dragon voice recognition software. Occasional wrong-word or sound-a-like substitutions may have occurred due to the inherent limitations of voice recognition software.      [name] [name], [name] Allegra, MD 09/26/22 [phone]

## 2022-09-27 ENCOUNTER — Inpatient Hospital Stay (HOSPITAL_COMMUNITY): Payer: Medicare HMO

## 2022-09-27 DIAGNOSIS — R630 Anorexia: Secondary | ICD-10-CM | POA: Diagnosis present

## 2022-09-27 DIAGNOSIS — E871 Hypo-osmolality and hyponatremia: Secondary | ICD-10-CM | POA: Diagnosis not present

## 2022-09-27 DIAGNOSIS — G894 Chronic pain syndrome: Secondary | ICD-10-CM | POA: Diagnosis present

## 2022-09-27 DIAGNOSIS — I129 Hypertensive chronic kidney disease with stage 1 through stage 4 chronic kidney disease, or unspecified chronic kidney disease: Secondary | ICD-10-CM | POA: Diagnosis present

## 2022-09-27 DIAGNOSIS — R748 Abnormal levels of other serum enzymes: Secondary | ICD-10-CM | POA: Diagnosis present

## 2022-09-27 DIAGNOSIS — F418 Other specified anxiety disorders: Secondary | ICD-10-CM

## 2022-09-27 DIAGNOSIS — Z79899 Other long term (current) drug therapy: Secondary | ICD-10-CM | POA: Diagnosis not present

## 2022-09-27 DIAGNOSIS — E782 Mixed hyperlipidemia: Secondary | ICD-10-CM | POA: Diagnosis present

## 2022-09-27 DIAGNOSIS — K219 Gastro-esophageal reflux disease without esophagitis: Secondary | ICD-10-CM | POA: Diagnosis present

## 2022-09-27 DIAGNOSIS — E118 Type 2 diabetes mellitus with unspecified complications: Secondary | ICD-10-CM

## 2022-09-27 DIAGNOSIS — Z9049 Acquired absence of other specified parts of digestive tract: Secondary | ICD-10-CM | POA: Diagnosis not present

## 2022-09-27 DIAGNOSIS — Z955 Presence of coronary angioplasty implant and graft: Secondary | ICD-10-CM | POA: Diagnosis not present

## 2022-09-27 DIAGNOSIS — D689 Coagulation defect, unspecified: Secondary | ICD-10-CM | POA: Diagnosis present

## 2022-09-27 DIAGNOSIS — Z96652 Presence of left artificial knee joint: Secondary | ICD-10-CM | POA: Diagnosis present

## 2022-09-27 DIAGNOSIS — R5383 Other fatigue: Secondary | ICD-10-CM | POA: Diagnosis not present

## 2022-09-27 DIAGNOSIS — Z66 Do not resuscitate: Secondary | ICD-10-CM | POA: Diagnosis present

## 2022-09-27 DIAGNOSIS — E1142 Type 2 diabetes mellitus with diabetic polyneuropathy: Secondary | ICD-10-CM | POA: Diagnosis present

## 2022-09-27 DIAGNOSIS — Z7902 Long term (current) use of antithrombotics/antiplatelets: Secondary | ICD-10-CM | POA: Diagnosis not present

## 2022-09-27 DIAGNOSIS — N1832 Chronic kidney disease, stage 3b: Secondary | ICD-10-CM | POA: Diagnosis present

## 2022-09-27 DIAGNOSIS — Z7982 Long term (current) use of aspirin: Secondary | ICD-10-CM | POA: Diagnosis not present

## 2022-09-27 DIAGNOSIS — Z8673 Personal history of transient ischemic attack (TIA), and cerebral infarction without residual deficits: Secondary | ICD-10-CM | POA: Diagnosis not present

## 2022-09-27 DIAGNOSIS — E1122 Type 2 diabetes mellitus with diabetic chronic kidney disease: Secondary | ICD-10-CM | POA: Diagnosis present

## 2022-09-27 DIAGNOSIS — N3000 Acute cystitis without hematuria: Secondary | ICD-10-CM | POA: Diagnosis not present

## 2022-09-27 DIAGNOSIS — Z888 Allergy status to other drugs, medicaments and biological substances status: Secondary | ICD-10-CM | POA: Diagnosis not present

## 2022-09-27 DIAGNOSIS — B9620 Unspecified Escherichia coli [E. coli] as the cause of diseases classified elsewhere: Secondary | ICD-10-CM | POA: Diagnosis present

## 2022-09-27 DIAGNOSIS — Z885 Allergy status to narcotic agent status: Secondary | ICD-10-CM | POA: Diagnosis not present

## 2022-09-27 DIAGNOSIS — Z882 Allergy status to sulfonamides status: Secondary | ICD-10-CM | POA: Diagnosis not present

## 2022-09-27 DIAGNOSIS — I2510 Atherosclerotic heart disease of native coronary artery without angina pectoris: Secondary | ICD-10-CM | POA: Diagnosis present

## 2022-09-27 LAB — BASIC METABOLIC PANEL
Anion gap: 12 (ref 5–15)
Anion gap: 12 (ref 5–15)
Anion gap: 10 (ref 5–15)
Anion gap: 10 (ref 5–15)
BUN: 12 mg/dL (ref 8–23)
BUN: 11 mg/dL (ref 8–23)
BUN: 10 mg/dL (ref 8–23)
BUN: 9 mg/dL (ref 8–23)
CO2: 28 mmol/L (ref 22–32)
CO2: 27 mmol/L (ref 22–32)
CO2: 30 mmol/L (ref 22–32)
CO2: 26 mmol/L (ref 22–32)
Calcium: 8.5 mg/dL — ABNORMAL LOW (ref 8.9–10.3)
Calcium: 8.4 mg/dL — ABNORMAL LOW (ref 8.9–10.3)
Calcium: 8.8 mg/dL — ABNORMAL LOW (ref 8.9–10.3)
Calcium: 8.2 mg/dL — ABNORMAL LOW (ref 8.9–10.3)
Chloride: 82 mmol/L — ABNORMAL LOW (ref 98–111)
Chloride: 86 mmol/L — ABNORMAL LOW (ref 98–111)
Chloride: 86 mmol/L — ABNORMAL LOW (ref 98–111)
Chloride: 90 mmol/L — ABNORMAL LOW (ref 98–111)
Creatinine, Ser: 0.56 mg/dL (ref 0.44–1.00)
Creatinine, Ser: 0.52 mg/dL (ref 0.44–1.00)
Creatinine, Ser: 0.7 mg/dL (ref 0.44–1.00)
Creatinine, Ser: 0.54 mg/dL (ref 0.44–1.00)
GFR, Estimated: >60 mL/min (ref >60)
GFR, Estimated: >60 mL/min (ref >60)
GFR, Estimated: >60 mL/min (ref >60)
GFR, Estimated: >60 mL/min (ref >60)
Glucose, Bld: 138 mg/dL — ABNORMAL HIGH (ref 70–99)
Glucose, Bld: 139 mg/dL — ABNORMAL HIGH (ref 70–99)
Glucose, Bld: 167 mg/dL — ABNORMAL HIGH (ref 70–99)
Glucose, Bld: 135 mg/dL — ABNORMAL HIGH (ref 70–99)
Potassium: 3.2 mmol/L — ABNORMAL LOW (ref 3.5–5.1)
Potassium: 3.5 mmol/L (ref 3.5–5.1)
Potassium: 4.1 mmol/L (ref 3.5–5.1)
Potassium: 3.5 mmol/L (ref 3.5–5.1)
Sodium: 122 mmol/L — ABNORMAL LOW (ref 135–145)
Sodium: 125 mmol/L — ABNORMAL LOW (ref 135–145)
Sodium: 126 mmol/L — ABNORMAL LOW (ref 135–145)
Sodium: 126 mmol/L — ABNORMAL LOW (ref 135–145)

## 2022-09-27 LAB — CBC WITH DIFFERENTIAL/PLATELET
Abs Immature Granulocytes: 0.01 10*3/uL (ref 0.00–0.07)
Basophils Absolute: 0.1 10*3/uL (ref 0.0–0.1)
Basophils Relative: 1 %
Eosinophils Absolute: 0.2 10*3/uL (ref 0.0–0.5)
Eosinophils Relative: 2 %
HCT: 39.9 % (ref 36.0–46.0)
Hemoglobin: 14.2 g/dL (ref 12.0–15.0)
Immature Granulocytes: 0 %
Lymphocytes Relative: 27 %
Lymphs Abs: 2.1 10*3/uL (ref 0.7–4.0)
MCH: 31.1 pg (ref 26.0–34.0)
MCHC: 35.6 g/dL (ref 30.0–36.0)
MCV: 87.3 fL (ref 80.0–100.0)
Monocytes Absolute: 0.7 10*3/uL (ref 0.1–1.0)
Monocytes Relative: 10 %
Neutro Abs: 4.6 10*3/uL (ref 1.7–7.7)
Neutrophils Relative %: 60 %
Platelets: 243 10*3/uL (ref 150–400)
RBC: 4.57 MIL/uL (ref 3.87–5.11)
RDW: 12.1 % (ref 11.5–15.5)
WBC: 7.7 10*3/uL (ref 4.0–10.5)
nRBC: 0 % (ref 0.0–0.2)

## 2022-09-27 LAB — OSMOLALITY, URINE: Osmolality, Ur: 207 mOsm/kg — ABNORMAL LOW (ref 300–900)

## 2022-09-27 LAB — LIPASE, BLOOD: Lipase: 57 U/L — ABNORMAL HIGH (ref 11–51)

## 2022-09-27 LAB — MRSA NEXT GEN BY PCR, NASAL: MRSA by PCR Next Gen: NOT DETECTED

## 2022-09-27 LAB — SODIUM, URINE, RANDOM: Sodium, Ur: 38 mmol/L

## 2022-09-27 MED ORDER — POTASSIUM CHLORIDE IN NACL 20-0.9 MEQ/L-% IV SOLN
INTRAVENOUS | Status: DC
  Filled 2022-09-27: qty 1000

## 2022-09-27 MED ORDER — CLOPIDOGREL BISULFATE 75 MG PO TABS
75.0000 mg | ORAL_TABLET | Freq: Every day | ORAL | Status: DC
  Administered 2022-09-27 – 2022-09-29 (×3): 75 mg via ORAL
  Filled 2022-09-27 (×3): qty 1

## 2022-09-27 MED ORDER — LOSARTAN POTASSIUM 50 MG PO TABS
25.0000 mg | ORAL_TABLET | Freq: Every day | ORAL | Status: DC
  Administered 2022-09-27 – 2022-09-29 (×3): 25 mg via ORAL
  Filled 2022-09-27 (×3): qty 1

## 2022-09-27 MED ORDER — CARBAMIDE PEROXIDE 10 % MT SOLN: Freq: Three times a day (TID) | OROMUCOSAL | Status: DC | PRN

## 2022-09-27 MED ORDER — ORAL CARE MOUTH RINSE: 15.0000 mL | OROMUCOSAL | Status: DC | PRN

## 2022-09-27 MED ORDER — ATORVASTATIN CALCIUM 40 MG PO TABS
80.0000 mg | ORAL_TABLET | Freq: Every day | ORAL | Status: DC
  Administered 2022-09-27 – 2022-09-29 (×3): 80 mg via ORAL
  Filled 2022-09-27 (×3): qty 2

## 2022-09-27 MED ORDER — EZETIMIBE 10 MG PO TABS
10.0000 mg | ORAL_TABLET | Freq: Every morning | ORAL | Status: DC
  Administered 2022-09-27 – 2022-09-29 (×3): 10 mg via ORAL
  Filled 2022-09-27 (×3): qty 1

## 2022-09-27 MED ORDER — MELATONIN 3 MG PO TABS
6.0000 mg | ORAL_TABLET | Freq: Every evening | ORAL | Status: DC | PRN
  Administered 2022-09-27 – 2022-09-28 (×2): 6 mg via ORAL
  Filled 2022-09-27 (×2): qty 2

## 2022-09-27 MED ORDER — CHLORHEXIDINE GLUCONATE CLOTH 2 % EX PADS
6.0000 | MEDICATED_PAD | Freq: Every day | CUTANEOUS | Status: DC
  Administered 2022-09-27 – 2022-09-29 (×3): 6 via TOPICAL

## 2022-09-27 MED ORDER — MAGNESIUM OXIDE -MG SUPPLEMENT 400 (240 MG) MG PO TABS
400.0000 mg | ORAL_TABLET | Freq: Every day | ORAL | Status: DC
  Administered 2022-09-27 – 2022-09-29 (×3): 400 mg via ORAL
  Filled 2022-09-27 (×3): qty 1

## 2022-09-27 MED ORDER — ONDANSETRON HCL 4 MG/2ML IJ SOLN
4.0000 mg | Freq: Four times a day (QID) | INTRAMUSCULAR | Status: DC | PRN
  Administered 2022-09-28: 4 mg via INTRAVENOUS
  Filled 2022-09-27: qty 2

## 2022-09-27 MED ORDER — METOPROLOL SUCCINATE ER 25 MG PO TB24
75.0000 mg | ORAL_TABLET | Freq: Every morning | ORAL | Status: DC
  Administered 2022-09-27 – 2022-09-29 (×3): 75 mg via ORAL
  Filled 2022-09-27 (×4): qty 3

## 2022-09-27 MED ORDER — MAGNESIUM OXIDE 400 MG PO TABS
400.0000 mg | ORAL_TABLET | Freq: Every day | ORAL | Status: DC
  Filled 2022-09-27: qty 1

## 2022-09-27 MED ORDER — ONDANSETRON HCL 4 MG PO TABS: 4.0000 mg | ORAL_TABLET | Freq: Four times a day (QID) | ORAL | Status: DC | PRN

## 2022-09-27 MED ORDER — NITROGLYCERIN 0.4 MG SL SUBL: 0.4000 mg | SUBLINGUAL_TABLET | SUBLINGUAL | Status: DC | PRN

## 2022-09-27 MED ORDER — SENNOSIDES-DOCUSATE SODIUM 8.6-50 MG PO TABS: 1.0000 | ORAL_TABLET | Freq: Every evening | ORAL | Status: DC | PRN

## 2022-09-27 MED ORDER — PANTOPRAZOLE SODIUM 40 MG PO TBEC
40.0000 mg | DELAYED_RELEASE_TABLET | Freq: Every day | ORAL | Status: DC
  Administered 2022-09-27 – 2022-09-29 (×3): 40 mg via ORAL
  Filled 2022-09-27 (×3): qty 1

## 2022-09-27 MED ORDER — ASPIRIN 81 MG PO TBEC
81.0000 mg | DELAYED_RELEASE_TABLET | Freq: Every morning | ORAL | Status: DC
  Administered 2022-09-27 – 2022-09-29 (×3): 81 mg via ORAL
  Filled 2022-09-27 (×3): qty 1

## 2022-09-27 MED ORDER — POTASSIUM CHLORIDE IN NACL 20-0.9 MEQ/L-% IV SOLN
INTRAVENOUS | Status: DC
  Filled 2022-09-27 (×3): qty 1000

## 2022-09-27 MED ORDER — ENOXAPARIN SODIUM 40 MG/0.4ML IJ SOSY
40.0000 mg | PREFILLED_SYRINGE | INTRAMUSCULAR | Status: DC
  Administered 2022-09-27 – 2022-09-28 (×2): 40 mg via SUBCUTANEOUS
  Filled 2022-09-27 (×3): qty 0.4

## 2022-09-27 MED ORDER — AMLODIPINE BESYLATE 5 MG PO TABS
5.0000 mg | ORAL_TABLET | Freq: Every day | ORAL | Status: DC
  Administered 2022-09-27 – 2022-09-29 (×3): 5 mg via ORAL
  Filled 2022-09-27 (×3): qty 1

## 2022-09-27 NOTE — ED Notes (Addendum)
Patient son Kenton Kingfisher notified via telephone [phone] of pt new unit info ([facility]; room 1237)

## 2022-09-27 NOTE — Progress Notes (Signed)
No charge FU note.   Seen and examined this morning on stepdown unit.  She is awake alert oriented, and comfortable.  Physical exam: General:  Alert, oriented, calm, in no acute distress  Eyes: EOMI, clear conjuctivae, white sclerea Neck: supple, no masses, trachea mildline  Cardiovascular: RRR, no murmurs or rubs, no peripheral edema  Respiratory: clear to auscultation bilaterally, no wheezes, no crackles  Abdomen: soft, nontender, nondistended, normal bowel tones heard  Skin: dry, no rashes  Musculoskeletal: no joint effusions, normal range of motion  Psychiatric: appropriate affect, normal speech  Neurologic: extraocular muscles intact, clear speech, moving all extremities with intact sensorium  Brief plan of care: -Continue care on stepdown -Continue normal saline infusion -Follow sodium level every 8 hours -Other plans per history and physical

## 2022-09-27 NOTE — H&P (Addendum)
PCP:   Sonia Side., FNP   Chief Complaint:  Low sodium  HPI: This is a 82 year old female with past medical history of CAD, chronic pain syndrome, anxiety, history of CVA, diabetes mellitus, hypercholesterolemia, neuromuscular disorder, hypertension,.  Patient states she has been doing good, doing her housekeeping and her ADLs.  She states she is has not been nausea, vomiting or had diarrhea.  She has had an occasional low-grade fever.  She has also had a headache for the past week or so. Nothing debilitating.  She says she has been eating well.  Today her headache got worse, she started feeling funny in her head.  She states she is not lightheaded she is not dizzy just funny.  She denies any confusion.  She states that today whenever she stated she will get nauseous.  The patient denies any vomiting.  She is weak and today she attempted to have a bath but became too nauseous and had to abort.  The patient saw PCP yesterday, blood work was done.  COVID and flu done were also negative.  Today she was called and told to go to [facility].  She was told her sodium was low.  In the ER at [facility], patient's sodium was 118.  Transfer requested.  Review of Systems:  The patient denies anorexia, fever, weight loss,, vision loss, decreased hearing, hoarseness, chest pain, syncope, dyspnea on exertion, peripheral edema, balance deficits, hemoptysis, abdominal pain, melena, hematochezia, severe indigestion/heartburn, hematuria, incontinence, genital sores, muscle weakness, suspicious skin lesions, transient blindness, difficulty walking, depression, unusual weight change, abnormal bleeding, enlarged lymph nodes, angioedema, and breast masses. Positives: Headache, nausea, weakness  Past Medical History: Past Medical History:  Diagnosis Date   Allergy    Anal fissure    Anxiety    CAD (coronary artery disease)    s/p inf MI 2007 - tx w/ DES to RCA // s/p POBA to [facility] and DES to [place] in 11/18  ([facility] in [place], [place]) // Myoview 3/21: low risk // s/p DES to pRCA   Cataract    removed both eyes   Cerebrovascular disease    Carotid US 09/2017 [facility]): mild plaque (<50%) in both carotid arteries   Chronic neck pain    Chronic pain syndrome    Diabetes Mellitus, Type 2    Diverticular disease    DJD (degenerative joint disease)    Echocardiogram 10/2018    Echo 10/2018: EF 55-60, normal RVSF, mod MAC, mod TR, severe AoV calcification and sclerosis with nodular calcium/mobile area of calcium in the LVOT (small veg vs Lambl's excrescence  - consider TEE), mild AI, mild AS (mean 11).    Echocardiogram 04/2019    Echocardiogram 04/2019: EF 60-65, basal septal hypertrophy, grade 2 diastolic dysfunction, normal wall motion, normal RV SF, mild LAE, mod MAC, trivial MR, mod sclerosis of the aortic valve with mod aortic annular calcification, thin mobile filamentous structure on ventricular side of AV likely representing Lambl's excrescence, mild AI, mild TR   Frequent UTI's    Gastroesophageal reflux disease    H/O bacterial endocarditis    Rx w IV Abxs in 2020 ([facility] in [place], [place]) // F/u echo with AoV Lambl's excrescence   H/O hiatal hernia    HTN (hypertension)    Hx of fall 10/2020   Hx of MI 2007   Hx of Stroke    IBS (irritable bowel syndrome)    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 (Cervical Fusion)   Premature ventricular contractions    Tubular adenoma of colon    Ulcer    [facility]   Urinary incontinence    Past Surgical History:  Procedure Laterality Date   APPENDECTOMY  1955   BLADDER REPAIR  2007   BRONCHIAL BIOPSY  05/17/2022   Procedure: BRONCHIAL  BIOPSIES;  Surgeon: [name], MD;  Location: [facility] ENDOSCOPY;  Service: Pulmonary;;   BRONCHIAL BRUSHINGS  05/17/2022   Procedure: BRONCHIAL BRUSHINGS;  Surgeon: [name], MD;  Location: [facility] ENDOSCOPY;  Service: Pulmonary;;   BRONCHIAL NEEDLE ASPIRATION BIOPSY  05/17/2022   Procedure: BRONCHIAL NEEDLE ASPIRATION BIOPSIES;  Surgeon: [name], MD;  Location: [facility] ENDOSCOPY;  Service: Pulmonary;;   CARDIAC CATHETERIZATION  2007   Stents   CERVICAL SPINE SURGERY  07/2005   Dr Consuello Masse   CHOLECYSTECTOMY  2007   COLONOSCOPY     CORONARY PRESSURE WIRE/FFR WITH 3D MAPPING N/A 06/28/2021   Procedure: Coronary Pressure Wire/FFR w/3D Mapping;  Surgeon: Burnell Blanks, MD;  Location: [facility] CV LAB;  Service: Cardiovascular;  Laterality: N/A;   CORONARY STENT INTERVENTION N/A 03/09/2020   Procedure: CORONARY STENT INTERVENTION;  Surgeon: Burnell Blanks, MD;  Location: [place] CV LAB;  Service: Cardiovascular;  Laterality: N/A;   CORONARY STENT INTERVENTION N/A 06/28/2021   Procedure: CORONARY STENT INTERVENTION;  Surgeon: Burnell Blanks, MD;  Location: [facility] CV LAB;  Service: Cardiovascular;  Laterality: N/A;   DENTAL SURGERY  05/2013   replaced an inplant   EYE SURGERY  2011   Bilteral   FIDUCIAL MARKER PLACEMENT  05/17/2022   Procedure: FIDUCIAL MARKER PLACEMENT;  Surgeon: [name], MD;  Location: [facility] ENDOSCOPY;  Service: Pulmonary;;  LUL   I & D KNEE WITH POLY EXCHANGE  09/25/2011   Procedure: IRRIGATION AND DEBRIDEMENT KNEE WITH POLY EXCHANGE;  Surgeon: Johnny Bridge, MD;  Location: [facility];  Service: Orthopedics;  Laterality: Left;   INCISION AND DRAINAGE ABSCESS / HEMATOMA OF BURSA / KNEE / THIGH  09/2011   JOINT REPLACEMENT  2012   left   LEFT HEART CATH AND CORONARY ANGIOGRAPHY N/A 03/09/2020   Procedure: LEFT HEART CATH AND CORONARY ANGIOGRAPHY;  Surgeon: Burnell Blanks, MD;  Location: [place] CV LAB;  Service:  Cardiovascular;  Laterality: N/A;   LEFT HEART CATH AND CORONARY ANGIOGRAPHY N/A 06/28/2021   Procedure: LEFT HEART CATH AND CORONARY ANGIOGRAPHY;  Surgeon: Burnell Blanks, MD;  Location: [facility] CV LAB;  Service: Cardiovascular;  Laterality: N/A;   left Total Knee Replacement  11/2010   Dr Percell Miller   NECK SURGERY     has had 3 surgeries   POLYPECTOMY     POSTERIOR CERVICAL FUSION/FORAMINOTOMY  04/08/2012   Procedure: POSTERIOR CERVICAL FUSION/FORAMINOTOMY LEVEL 2;  Surgeon: Otilio Connors, MD;  Location: [facility] NEURO ORS;  Service: Neurosurgery;  Laterality: Left;  Left Cervical six-seven Foraminotomy, bilateral cervical seven-thoracic one foraminotomy, cervical six-seven, cervical seven-thoracic one fusion with posterior instrumentation   TEMPOROMANDIBULAR JOINT SURGERY     thumb surgery     TONSILLECTOMY     TONSILLECTOMY  1950   TOTAL  ABDOMINAL HYSTERECTOMY     VIDEO BRONCHOSCOPY WITH RADIAL ENDOBRONCHIAL ULTRASOUND  05/17/2022   Procedure: VIDEO BRONCHOSCOPY WITH RADIAL ENDOBRONCHIAL ULTRASOUND;  Surgeon: [name], MD;  Location: [facility] ENDOSCOPY;  Service: Pulmonary;;    Medications: Prior to Admission medications   Medication Sig Start Date End Date Taking? Authorizing Provider  acetaminophen (TYLENOL) 325 MG tablet Take 650 mg by mouth every 6 (six) hours as needed for moderate pain.    [provider]  amLODipine (NORVASC) 5 MG tablet Take 1 tablet (5 mg total) by mouth daily. 11/07/21   Hoyt Koch, MD  amoxicillin-clavulanate (AUGMENTIN) 500-125 MG tablet Take 1 tablet by mouth 2 (two) times daily. 07/23/22   [provider]  Ascorbic Acid (VITAMIN C) 500 MG CAPS Take 500 mg by mouth in the morning and at bedtime.    [provider]  aspirin EC 81 MG tablet Take 81 mg by mouth every morning.     [provider]  atorvastatin (LIPITOR) 80 MG tablet TAKE ONE TABLET BY MOUTH EVERYDAY AT BEDTIME 08/06/22   Janith Lima, MD   Cinnamon 500 MG TABS Take 1,000 mg by mouth daily.    [provider]  clopidogrel (PLAVIX) 75 MG tablet TAKE ONE CAPSULE BY MOUTH EVERY MORNING 08/06/22   Janith Lima, MD  dicyclomine (BENTYL) 10 MG capsule Take 1 capsule (10 mg total) by mouth 4 (four) times daily. 05/18/22   Janith Lima, MD  ezetimibe (ZETIA) 10 MG tablet TAKE ONE TABLET BY MOUTH EVERY MORNING 08/06/22   Janith Lima, MD  gabapentin (NEURONTIN) 300 MG capsule TAKE TWO CAPSULES BY MOUTH EVERY MORNING and TAKE TWO CAPSULES BY MOUTH EVERYDAY AT BEDTIME 08/06/22   Janith Lima, MD  Krill Oil 1000 MG CAPS Take 1,000 mg by mouth daily.    [provider]  losartan (COZAAR) 25 MG tablet Take 1 tablet (25 mg total) by mouth daily. 02/12/22   Loel Dubonnet, NP  magnesium oxide (MAG-OX) 400 MG tablet Take 400 mg by mouth daily.    [provider]  metoprolol succinate (TOPROL-XL) 50 MG 24 hr tablet Take 1.5 tablets (75 mg total) by mouth every morning. 02/06/22   Loel Dubonnet, NP  Misc Natural Products (FOCUSED MIND PO) Take 15 mg by mouth daily.    [provider]  nitroGLYCERIN (NITROSTAT) 0.4 MG SL tablet DISSOLVE 1 TABLET UNDER THE TONGUE EVERY 5 MINUTES AS NEEDED FOR CHEST PAIN. DO NOT EXCEED A TOTAL OF 3 DOSES IN 15 MINUTES. 11/29/21   Janith Lima, MD  Omega-3 Fatty Acids (FISH OIL) 1000 MG CAPS Take 1,000 mg by mouth in the morning and at bedtime.    [provider]  pantoprazole (PROTONIX) 40 MG tablet TAKE ONE TABLET BY MOUTH ONCE DAILY 08/06/22   Janith Lima, MD  Psyllium (DAILY FIBER PO) Take 1 capsule by mouth daily.    [provider]  TURMERIC PO Take 1,000 mg by mouth in the morning and at bedtime.    [provider]  venlafaxine XR (EFFEXOR-XR) 37.5 MG 24 hr capsule TAKE ONE CAPSULE BY MOUTH EVERY MORNING 08/06/22   Janith Lima, MD  vitamin E 1000 UNIT capsule Take 1,000 Units by mouth in the morning and at bedtime.    [provider]    Allergies:   Allergies  Allergen Reactions   Macrobid [Nitrofurantoin] Nausea Only    Severe nausea   Sulfa Antibiotics  Itching   Niacin And Related Other (See Comments)    Must take "Flush-free"   Codeine Nausea Only   Prednisone Itching and Rash   Rocephin [Ceftriaxone] Itching   Sulfonamide Derivatives Itching   Tetracycline Itching and Rash    Social History:  reports that she has never smoked. She has never been exposed to tobacco smoke. She has never used smokeless tobacco. She reports that she does not drink alcohol and does not use drugs.  Family History: Family History  Problem Relation Age of Onset   Heart disease Mother    Pancreatic cancer Mother    Lung cancer Brother    Cancer - Other Brother        vocal cord cancer   Colon polyps Sister    Coronary artery disease Other        sibling   Coronary artery disease Other        sibling   Colon cancer Neg Hx    Esophageal cancer Neg Hx    Rectal cancer Neg Hx    Stomach cancer Neg Hx     Physical Exam: Vitals:   09/26/22 2220 09/26/22 2330 09/27/22 0000 09/27/22 0154  BP:  (!) 116/49 (!) 114/47 (!) 184/76  Pulse:  65 64 81  Resp:  12 12 (!) 23  Temp:      TempSrc: Oral     SpO2:  94% 93% 100%  Weight:      Height:        General:  Alert and oriented times three, well developed and nourished, no acute distress Eyes: PERRLA, pink conjunctiva, no scleral icterus ENT: Moist oral mucosa, neck supple, no thyromegaly Lungs: clear to ascultation, no wheeze, no crackles, no use of accessory muscles Cardiovascular: regular rate and rhythm, no regurgitation, no gallops, no murmurs. No carotid bruits, no JVD Abdomen: soft, positive BS, non-tender, non-distended, no organomegaly, not an acute abdomen GU: not examined Neuro: CN II - XII grossly intact, sensation intact Musculoskeletal: strength 5/5 all extremities, no clubbing, cyanosis or edema Skin: no rash, no subcutaneous crepitation, no  decubitus Psych: appropriate patient   Labs on Admission:  Recent Labs    09/26/22 1103 09/26/22 1121 09/26/22 1730  NA 118*  --  121*  K 4.0  --  3.4*  CL 78*  --  83*  CO2 32  --  34*  GLUCOSE 149*  --  170*  BUN 15  --  14  CREATININE 0.73  --  0.59  CALCIUM 9.3  --  8.6*  MG  --  1.8  --    Recent Labs    09/26/22 1103  AST 27  ALT 27  ALKPHOS 49  BILITOT 0.6  PROT 7.3  ALBUMIN 4.3   Recent Labs    09/26/22 1121  LIPASE 104*   Recent Labs    09/26/22 1103  WBC 7.5  HGB 14.4  HCT 40.7  MCV 87.2  PLT 282    Recent Labs    09/26/22 1121  TSH 2.792    Radiological Exams on Admission: DG Chest 2 View  Result Date: 09/26/2022 CLINICAL DATA:  Fatigue, hyponatremia EXAM: CHEST - 2 VIEW COMPARISON:  05/17/2022 FINDINGS: Elevation of the right diaphragm unchanged from the prior exam. No focal consolidation. No pleural effusion or pneumothorax. Heart and mediastinal contours are unremarkable. No acute osseous abnormality. IMPRESSION: No active cardiopulmonary disease. Electronically Signed   By: Kathreen Devoid M.D.   On: 09/26/2022 13:11   CT  Head Wo Contrast  Result Date: 09/26/2022 CLINICAL DATA:  Head trauma, minor (Age >= 65y) Fall, headache, electrolyte abnormalities EXAM: CT HEAD WITHOUT CONTRAST TECHNIQUE: Contiguous axial images were obtained from the base of the skull through the vertex without intravenous contrast. RADIATION DOSE REDUCTION: This exam was performed according to the departmental dose-optimization program which includes automated exposure control, adjustment of the mA and/or kV according to patient size and/or use of iterative reconstruction technique. COMPARISON:  CT head 05/02/2021. FINDINGS: Brain: No evidence of acute large vascular territory infarction, hemorrhage, hydrocephalus, extra-axial collection or mass lesion/mass effect. Moderate patchy white matter hypodensities, nonspecific but compatible with chronic microvascular ischemic  disease. Vascular: Calcific atherosclerosis. No hyperdense vessel identified. Skull: No acute fracture. Sinuses/Orbits: Visualized sinuses are clear. No acute orbital findings. Other: No mastoid effusions. IMPRESSION: 1. No evidence of acute intracranial abnormality. 2. Moderate chronic microvascular ischemic disease. Electronically Signed   By: Margaretha Sheffield M.D.   On: 09/26/2022 12:22    Assessment/Plan Present on Admission:  Hyponatremia -Patient was transferred to stepdown -Repeat sodium was 121.  Repeat BMP ordered, every 4 hours BMPs -IV fluid, initiated.  BMP done sodium 122. -TSH, urine and plasma osmolality ordered. -CT chest ordered as patient is relates history of fall something on the finding been noted on a prior CT scan in July 2023. -Patient also on venlafaxine, held, ?  SIADH   Acute cystitis -Cultures pending.  Rocephin 1 g IV given in ER, will continue   Elevated lipase -Unclear significance.  LFTs normal.  Abdominal exam benign. -Repeat lipase level in a.m.   Controlled diabetes mellitus type 2 with complications (HCC)  Diabetic peripheral neuropathy (HCC) -Borderline.  Monitor   Mixed hyperlipidemia -Stable, atorvastatin, Zetia resumed   Depression with anxiety -Venlafaxine held   Essential hypertension -Stable, losartan, metoprolol, Norvasc resumed   CAD (coronary artery disease) -Stable, losartan, metoprolol, atorvastatin, Zetia, aspirin, Plavix resumed   History of CVA (HCC) -Stable, aspirin, Plavix, Zetia, atorvastatin resumed  Pauline Pegues 09/27/2022, 2:19 AM

## 2022-09-28 DIAGNOSIS — E871 Hypo-osmolality and hyponatremia: Secondary | ICD-10-CM | POA: Diagnosis not present

## 2022-09-28 LAB — CBC
HCT: 37.4 % (ref 36.0–46.0)
Hemoglobin: 12.9 g/dL (ref 12.0–15.0)
MCH: 31.2 pg (ref 26.0–34.0)
MCHC: 34.5 g/dL (ref 30.0–36.0)
MCV: 90.6 fL (ref 80.0–100.0)
Platelets: 256 10*3/uL (ref 150–400)
RBC: 4.13 MIL/uL (ref 3.87–5.11)
RDW: 12.4 % (ref 11.5–15.5)
WBC: 7.5 10*3/uL (ref 4.0–10.5)
nRBC: 0 % (ref 0.0–0.2)

## 2022-09-28 LAB — BASIC METABOLIC PANEL
Anion gap: 9 (ref 5–15)
Anion gap: 9 (ref 5–15)
Anion gap: 11 (ref 5–15)
BUN: 8 mg/dL (ref 8–23)
BUN: 7 mg/dL — ABNORMAL LOW (ref 8–23)
BUN: 9 mg/dL (ref 8–23)
CO2: 27 mmol/L (ref 22–32)
CO2: 26 mmol/L (ref 22–32)
CO2: 28 mmol/L (ref 22–32)
Calcium: 8.4 mg/dL — ABNORMAL LOW (ref 8.9–10.3)
Calcium: 8.5 mg/dL — ABNORMAL LOW (ref 8.9–10.3)
Calcium: 8.5 mg/dL — ABNORMAL LOW (ref 8.9–10.3)
Chloride: 94 mmol/L — ABNORMAL LOW (ref 98–111)
Chloride: 95 mmol/L — ABNORMAL LOW (ref 98–111)
Chloride: 91 mmol/L — ABNORMAL LOW (ref 98–111)
Creatinine, Ser: 0.52 mg/dL (ref 0.44–1.00)
Creatinine, Ser: 0.61 mg/dL (ref 0.44–1.00)
Creatinine, Ser: 0.58 mg/dL (ref 0.44–1.00)
GFR, Estimated: >60 mL/min (ref >60)
GFR, Estimated: >60 mL/min (ref >60)
GFR, Estimated: >60 mL/min (ref >60)
Glucose, Bld: 136 mg/dL — ABNORMAL HIGH (ref 70–99)
Glucose, Bld: 210 mg/dL — ABNORMAL HIGH (ref 70–99)
Glucose, Bld: 159 mg/dL — ABNORMAL HIGH (ref 70–99)
Potassium: 3.6 mmol/L (ref 3.5–5.1)
Potassium: 4.2 mmol/L (ref 3.5–5.1)
Potassium: 3.5 mmol/L (ref 3.5–5.1)
Sodium: 130 mmol/L — ABNORMAL LOW (ref 135–145)
Sodium: 130 mmol/L — ABNORMAL LOW (ref 135–145)
Sodium: 130 mmol/L — ABNORMAL LOW (ref 135–145)

## 2022-09-28 LAB — URINALYSIS, W/ REFLEX TO CULTURE (INFECTION SUSPECTED)

## 2022-09-28 MED ORDER — HYDRALAZINE HCL 20 MG/ML IJ SOLN: 10.0000 mg | INTRAMUSCULAR | Status: DC | PRN

## 2022-09-28 MED ORDER — PIPERACILLIN-TAZOBACTAM 3.375 G IVPB
3.3750 g | Freq: Three times a day (TID) | INTRAVENOUS | Status: DC
  Administered 2022-09-28 – 2022-09-29 (×3): 3.375 g via INTRAVENOUS
  Filled 2022-09-28 (×3): qty 50

## 2022-09-28 MED ORDER — HYDRALAZINE HCL 20 MG/ML IJ SOLN: 10.0000 mg | Freq: Once | INTRAMUSCULAR | Status: DC

## 2022-09-28 NOTE — Progress Notes (Signed)
PROGRESS NOTE  [name] [name] VOH:[id number] DOB: 30-Dec-1940 DOA: 09/26/2022 PCP: Sonia Side., [place] Hospital Course/Subjective: 82 year old female with past medical history of CAD, chronic pain syndrome, anxiety, history of CVA, diabetes mellitus, hypercholesterolemia, neuromuscular disorder, hypertension who had lab work done due to headache and was admitted to [facility] stepdown unit when she was found to have Na 118.   She feels well, just had a touch of nausea today but no more headache. Resting comfortably, says she is a little weak and has been using walker here.  Assessment/Plan:  Hyponatremia, improving -Patient was transferred to stepdown -IV fluid, initiated.  BMP shows sodium today 130 -TSH normal -CT chest ordered as patient is relates history of fall something on the finding been noted on a prior CT scan in July 2023 -- new pulm nodule which will need outpatient follow up -Patient also on venlafaxine, held, ?  SIADH -DC IVF today and continue to monitor Na levels closely    Acute cystitis -Cultures pending.  Rocephin 1 g IV given in ER, will continue for now    Elevated lipase -Unclear significance.  LFTs normal.  Abdominal exam benign.    Controlled diabetes mellitus type 2 (HCC)  Diabetic peripheral neuropathy (HCC) -Borderline, not on meds at home.  Monitor -Blood sugars good on regular diet here    Mixed hyperlipidemia -Stable, atorvastatin, Zetia resumed    Depression with anxiety -Venlafaxine held    Essential hypertension -Stable, losartan, metoprolol, Norvasc resumed    CAD (coronary artery disease) -Stable, losartan, metoprolol, atorvastatin, Zetia, aspirin, Plavix resumed    History of CVA (HCC) -Stable, aspirin, Plavix, Zetia, atorvastatin resumed  DVT Prophylaxis: Lovenox  Code Status: DNR, confirmed with patient.    Family Communication: No family present this AM   Disposition Plan: Likely home in AM if Na stays stable.  PT/OT consulted  for any therapy needs.   Consultants: None   Procedures: None   Antimicrobials: Anti-infectives (From admission, onward)    None       Objective: Vitals:   09/28/22 0400 09/28/22 0600 09/28/22 0602 09/28/22 0801  BP: (!) 132/45 (!) 168/59    Pulse: 67  (!) 25   Resp: '15 17 20   '$ Temp:    98 F (36.7 C)  TempSrc:    Oral  SpO2: 93%  97%   Weight:      Height:        Intake/Output Summary (Last 24 hours) at 09/28/2022 6948 Last data filed at 09/28/2022 0600 Gross per 24 hour  Intake 1764.15 ml  Output --  Net 1764.15 ml   Filed Weights   09/26/22 1101  Weight: 55.5 kg   Exam: General:  Alert, oriented, calm, in no acute distress Eyes: EOMI, clear sclerea Neck: supple, no masses, trachea mildline  Cardiovascular: RRR, no murmurs or rubs, no peripheral edema  Respiratory: clear to auscultation bilaterally, no wheezes, no crackles  Abdomen: soft, nontender, nondistended, normal bowel tones heard  Skin: dry, no rashes  Musculoskeletal: no joint effusions, normal range of motion  Psychiatric: appropriate affect, normal speech  Neurologic: extraocular muscles intact, clear speech, moving all extremities with intact sensorium   Data Reviewed: CBC: Recent Labs  Lab 09/26/22 1103 09/27/22 0712 09/28/22 0309  WBC 7.5 7.7 7.5  NEUTROABS  --  4.6  --   HGB 14.4 14.2 12.9  HCT 40.7 39.9 37.4  MCV 87.2 87.3 90.6  PLT 282 243 546   Basic Metabolic Panel: Recent  Labs  Lab 09/26/22 1121 09/26/22 1730 09/27/22 0318 09/27/22 0712 09/27/22 1037 09/27/22 1824 09/28/22 0309  NA  --    < > 122* 125* 126* 126* 130*  K  --    < > 3.2* 3.5 4.1 3.5 3.6  CL  --    < > 82* 86* 86* 90* 94*  CO2  --    < > '28 27 30 26 27  '$ GLUCOSE  --    < > 138* 139* 167* 135* 136*  BUN  --    < > '12 11 10 9 8  '$ CREATININE  --    < > 0.56 0.52 0.70 0.54 0.52  CALCIUM  --    < > 8.5* 8.4* 8.8* 8.2* 8.4*  MG 1.8  --   --   --   --   --   --    < > = values in this interval not  displayed.   GFR: Estimated Creatinine Clearance: 41.2 mL/min (by C-G formula based on SCr of 0.52 mg/dL). Liver Function Tests: Recent Labs  Lab 09/26/22 1103  AST 27  ALT 27  ALKPHOS 49  BILITOT 0.6  PROT 7.3  ALBUMIN 4.3   Recent Labs  Lab 09/26/22 1121 09/27/22 0712  LIPASE 104* 57*   No results for input(s): "AMMONIA" in the last 168 hours. Coagulation Profile: No results for input(s): "INR", "PROTIME" in the last 168 hours. Cardiac Enzymes: No results for input(s): "CKTOTAL", "CKMB", "CKMBINDEX", "TROPONINI" in the last 168 hours. BNP (last 3 results) No results for input(s): "PROBNP" in the last 8760 hours. HbA1C: No results for input(s): "HGBA1C" in the last 72 hours. CBG: No results for input(s): "GLUCAP" in the last 168 hours. Lipid Profile: No results for input(s): "CHOL", "HDL", "LDLCALC", "TRIG", "CHOLHDL", "LDLDIRECT" in the last 72 hours. Thyroid Function Tests: Recent Labs    09/26/22 1121  TSH 2.792   Anemia Panel: No results for input(s): "VITAMINB12", "FOLATE", "FERRITIN", "TIBC", "IRON", "RETICCTPCT" in the last 72 hours. Urine analysis:    Component Value Date/Time   COLORURINE YELLOW 09/26/2022 1458   APPEARANCEUR HAZY (A) 09/26/2022 1458   LABSPEC 1.008 09/26/2022 1458   PHURINE 7.0 09/26/2022 1458   GLUCOSEU NEGATIVE 09/26/2022 1458   GLUCOSEU NEGATIVE 03/05/2022 0841   HGBUR NEGATIVE 09/26/2022 1458   BILIRUBINUR NEGATIVE 09/26/2022 1458   BILIRUBINUR negative 03/10/2015 1516   KETONESUR NEGATIVE 09/26/2022 1458   PROTEINUR NEGATIVE 09/26/2022 1458   UROBILINOGEN 1.0 03/05/2022 0841   NITRITE POSITIVE (A) 09/26/2022 1458   LEUKOCYTESUR MODERATE (A) 09/26/2022 1458   Sepsis Labs: '@LABRCNTIP'$ (procalcitonin:4,lacticidven:4)  ) Recent Results (from the past 240 hour(s))  MRSA Next Gen by PCR, Nasal     Status: None   Collection Time: 09/27/22  2:07 AM   Specimen: Nasal Mucosa; Nasal Swab  Result Value Ref Range Status   MRSA by  PCR Next Gen NOT DETECTED NOT DETECTED Final    Comment: (NOTE) The GeneXpert MRSA Assay (FDA approved for NASAL specimens only), is one component of a comprehensive MRSA colonization surveillance program. It is not intended to diagnose MRSA infection nor to guide or monitor treatment for MRSA infections. Test performance is not FDA approved in patients less than 48 years old. Performed at [facility], [place] [address] [place], [place] [address]      Studies: No results found.  Scheduled Meds:  amLODipine  5 mg Oral Daily   aspirin EC  81 mg Oral q morning  atorvastatin  80 mg Oral Daily   Chlorhexidine Gluconate Cloth  6 each Topical Q0600   clopidogrel  75 mg Oral Daily   enoxaparin (LOVENOX) injection  40 mg Subcutaneous Q24H   ezetimibe  10 mg Oral q morning   losartan  25 mg Oral Daily   magnesium oxide  400 mg Oral Daily   metoprolol succinate  75 mg Oral q morning   pantoprazole  40 mg Oral Daily    Continuous Infusions:   LOS: 1 day   Time spent: 31 minutes  Saddie Sandeen Marry Guan, MD [facility] Pager [phone]  If 7PM-7AM, please contact night-coverage [email or web address] Password [facility] 09/28/2022, 8:32 AM

## 2022-09-29 DIAGNOSIS — E871 Hypo-osmolality and hyponatremia: Secondary | ICD-10-CM | POA: Diagnosis not present

## 2022-09-29 LAB — URINE CULTURE: Culture: >=100000 — AB

## 2022-09-29 LAB — CBC
HCT: 42.5 % (ref 36.0–46.0)
Hemoglobin: 14.6 g/dL (ref 12.0–15.0)
MCH: 31.1 pg (ref 26.0–34.0)
MCHC: 34.4 g/dL (ref 30.0–36.0)
MCV: 90.4 fL (ref 80.0–100.0)
Platelets: 164 10*3/uL (ref 150–400)
RBC: 4.7 MIL/uL (ref 3.87–5.11)
RDW: 12.4 % (ref 11.5–15.5)
WBC: 7.3 10*3/uL (ref 4.0–10.5)
nRBC: 0 % (ref 0.0–0.2)

## 2022-09-29 LAB — BASIC METABOLIC PANEL
Anion gap: 11 (ref 5–15)
BUN: 10 mg/dL (ref 8–23)
CO2: 26 mmol/L (ref 22–32)
Calcium: 8.9 mg/dL (ref 8.9–10.3)
Chloride: 93 mmol/L — ABNORMAL LOW (ref 98–111)
Creatinine, Ser: 0.62 mg/dL (ref 0.44–1.00)
GFR, Estimated: >60 mL/min (ref >60)
Glucose, Bld: 142 mg/dL — ABNORMAL HIGH (ref 70–99)
Potassium: 4.1 mmol/L (ref 3.5–5.1)
Sodium: 130 mmol/L — ABNORMAL LOW (ref 135–145)

## 2022-09-29 MED ORDER — AMOXICILLIN-POT CLAVULANATE 875-125 MG PO TABS: 1.0000 | ORAL_TABLET | Freq: Two times a day (BID) | ORAL | 0 refills | Status: AC

## 2022-09-29 MED ORDER — METOPROLOL SUCCINATE ER 25 MG PO TB24: 75.0000 mg | ORAL_TABLET | Freq: Every morning | ORAL | 0 refills | Status: DC

## 2022-09-29 NOTE — Progress Notes (Signed)
.   Transition of Care [facility]) Screening Note   Patient Details  Name: [name] [name] Date of Birth: 10-Oct-1940   Transition of Care [facility]) CM/SW Contact:    Illene Regulus, LCSW Phone Number: 09/29/2022, 12:35 PM    Transition of Care Department [facility]) has reviewed patient and no TOC needs have been identified at this time. We will continue to monitor patient advancement through interdisciplinary progression rounds. If new patient transition needs arise, please place a TOC consult.

## 2022-09-29 NOTE — TOC Transition Note (Signed)
Transition of Care [facility]) - CM/SW Discharge Note   Patient Details  Name: [name] [name] MRN: [id number] Date of Birth: 1940-12-21  Transition of Care [facility]) CM/SW Contact:  Illene Regulus, LCSW Phone Number: 09/29/2022, 1:24 PM   Clinical Narrative:     CSW spoke with pt and pt's daughter at bedside about recommendations for [facility] services. Pt is agreeable to [facility] Pt reported not having a preference just someone in the area. CSW reached out to [place] with [facility] who was able to accept for HHPT. CSW spoke with pt's daughter who stated concerns with pt's mobility. Pt's daughter reported pt's PCP ordered a walker to be delivered to pt's home on Tuesday. CSW explained if the hospital orders a walker and pt already has one, Medicare may make pt pay out of pocket for the walker. Pt's daughter declined the walker and is requesting EMS transport.         Patient Goals and CMS Choice CMS Medicare.gov Compare Post Acute Care list provided to:: Patient Choice offered to / list presented to : Patient  Discharge Placement                         Discharge Plan and Services Additional resources added to the After Visit Summary for                                       Social Determinants of Health (SDOH) Interventions SDOH Screenings   Food Insecurity: No Food Insecurity (09/27/2022)  Housing: Low Risk  (09/27/2022)  Transportation Needs: No Transportation Needs (09/27/2022)  Utilities: Not At Risk (09/27/2022)  Alcohol Screen: Low Risk  (11/03/2021)  Depression (PHQ2-9): Low Risk  (05/18/2022)  Financial Resource Strain: Low Risk  (11/03/2021)  Physical Activity: Sufficiently Active (11/03/2021)  Social Connections: Moderately Integrated (11/03/2021)  Stress: No Stress Concern Present (11/03/2021)  Tobacco Use: Low Risk  (09/26/2022)     Readmission Risk Interventions     No data to display

## 2022-09-29 NOTE — Evaluation (Signed)
Occupational Therapy Evaluation Patient Details Name: [name] [name] MRN: [id number] DOB: 12/23/1940 Today's Date: 09/29/2022   History of Present Illness 82 year old female with past medical history of CAD, chronic pain syndrome, anxiety, history of CVA, diabetes mellitus, hypercholesterolemia, neuromuscular disorder, hypertension who had lab work done due to headache and was admitted to [facility] stepdown unit when she was found to have Na 118. Patient admitted for hyponatremia.   Clinical Impression   [name] [name] is an 82 year old woman who demonstrates ability to ambulate with walker in room and perform ADLs without physical assistance. She had no loss of balance or complaints of dizziness with activity. She was able to ambulate to bathroom perform toileting and stand at sink to perform oral care. She presents with no apparent physical deficits. Patient has no OT needs.       Recommendations for follow up therapy are one component of a multi-disciplinary discharge planning process, led by the attending physician.  Recommendations may be updated based on patient status, additional functional criteria and insurance authorization.   Follow Up Recommendations  No OT follow up     Assistance Recommended at Discharge PRN  Patient can return home with the following Assistance with cooking/housework    Functional Status Assessment  Patient has not had a recent decline in their functional status  Equipment Recommendations  None recommended by OT    Recommendations for Other Services       Precautions / Restrictions Precautions Precautions: None Restrictions Weight Bearing Restrictions: No      Mobility Bed Mobility Overal bed mobility: Needs Assistance Bed Mobility: Supine to Sit     Supine to sit: Supervision          Transfers Overall transfer level: Needs assistance Equipment used: Rolling walker (2 wheels)               General transfer comment: Supervision  to ambulate in room with walker. No balance loss.      Balance Overall balance assessment: Mild deficits observed, not formally tested                                         ADL either performed or assessed with clinical judgement   ADL Overall ADL's : Independent                                             Vision Patient Visual Report: No change from baseline       Perception     Praxis      Pertinent Vitals/Pain Pain Assessment Pain Assessment: No/denies pain     Hand Dominance Right   Extremity/Trunk Assessment Upper Extremity Assessment Upper Extremity Assessment: Overall WFL for tasks assessed   Lower Extremity Assessment Lower Extremity Assessment: Defer to PT evaluation   Cervical / Trunk Assessment Cervical / Trunk Assessment: Normal   Communication Communication Communication: No difficulties   Cognition Arousal/Alertness: Awake/alert Behavior During Therapy: WFL for tasks assessed/performed Overall Cognitive Status: Within Functional Limits for tasks assessed                                 General Comments: Sweet and chatty.     General Comments  Exercises     Shoulder Instructions      Home Living Family/patient expects to be discharged to:: Private residence Living Arrangements: Alone Available Help at Discharge: Family Type of Home: Apartment Home Access: Elevator     Home Layout: One level     Bathroom Shower/Tub: Teacher, early years/pre: Standard     Home Equipment: Cane - single point;Shower seat   Additional Comments: is getting a walker and a smaller shower chair      Prior Functioning/Environment Prior Level of Function : Independent/Modified Independent             Mobility Comments: hadn't been using a walker, driving ADLs Comments: independent with ADLs, has groceries delievered, has a lady that cleans for her        OT Problem List:  Impaired balance (sitting and/or standing)      OT Treatment/Interventions:      OT Goals(Current goals can be found in the care plan section) Acute Rehab OT Goals Patient Stated Goal: go home OT Goal Formulation: All assessment and education complete, DC therapy  OT Frequency:      Co-evaluation              AM-PAC OT "6 Clicks" Daily Activity     Outcome Measure Help from another person eating meals?: None Help from another person taking care of personal grooming?: None Help from another person toileting, which includes using toliet, bedpan, or urinal?: None Help from another person bathing (including washing, rinsing, drying)?: None Help from another person to put on and taking off regular upper body clothing?: None Help from another person to put on and taking off regular lower body clothing?: None 6 Click Score: 24   End of Session Equipment Utilized During Treatment: Rolling walker (2 wheels) Nurse Communication: Mobility status  Activity Tolerance: Patient tolerated treatment well Patient left: in chair;with call bell/phone within reach  OT Visit Diagnosis: Unsteadiness on feet (R26.81)                Time: 4854-6270 OT Time Calculation (min): 23 min Charges:  OT General Charges $OT Visit: 1 Visit OT Evaluation $OT Eval Low Complexity: 1 Low  Gustavo Lah, OTR/L [place]  Office [phone]   Lenward Chancellor 09/29/2022, 11:03 AM

## 2022-09-29 NOTE — Evaluation (Addendum)
Physical Therapy Evaluation Patient Details Name: [name] [name] MRN: [id number] DOB: 17-Feb-1941 Today's Date: 09/29/2022  History of Present Illness  82 year old female with past medical history of CAD, chronic pain syndrome, anxiety, history of CVA, diabetes mellitus, hypercholesterolemia, neuromuscular disorder, hypertension who had lab work done due to headache and was admitted to [facility] stepdown unit when she was found to have Na 118. Patient admitted for hyponatremia.  Clinical Impression  Patient presents with mobility limited by generalized weakness and has history of falls at home per daughter.  Feel she should be safe to d/c with intermittent family support, and follow up HHPT.  She reports they have ordered rollator for home.  Did discuss safety at home.  Pt for d/c home so will defer further therapy to HHPT.        Recommendations for follow up therapy are one component of a multi-disciplinary discharge planning process, led by the attending physician.  Recommendations may be updated based on patient status, additional functional criteria and insurance authorization.  Follow Up Recommendations Home health PT      Assistance Recommended at Discharge Intermittent Supervision/Assistance  Patient can return home with the following  Assistance with cooking/housework;Help with stairs or ramp for entrance;Assist for transportation    Equipment Recommendations None recommended by PT  Recommendations for Other Services       Functional Status Assessment Patient has had a recent decline in their functional status and demonstrates the ability to make significant improvements in function in a reasonable and predictable amount of time.     Precautions / Restrictions Precautions Precautions: Fall Precaution Comments: daughter reports has had falls at home Restrictions Weight Bearing Restrictions: No      Mobility  Bed Mobility               General bed mobility comments: up in  recliner    Transfers Overall transfer level: Needs assistance Equipment used: Rolling walker (2 wheels) Transfers: Sit to/from Stand Sit to Stand: Supervision           General transfer comment: for lines/safety. cues for hand placement    Ambulation/Gait Ambulation/Gait assistance: Supervision, Min guard Gait Distance (Feet): 200 Feet Assistive device: Rolling walker (2 wheels) Gait Pattern/deviations: Step-through pattern, Decreased stride length       General Gait Details: mild imbalance with walker due to walker veering slightly, but corrected on her own, with minguard for safety; assist for lines and SpO2 reading low but not correlating with HR on ECG, but in 90's when relaxed hand on walker  Stairs            Wheelchair Mobility    Modified Rankin (Stroke Patients Only)       Balance Overall balance assessment: Needs assistance   Sitting balance-Leahy Scale: Good     Standing balance support: During functional activity, No upper extremity supported Standing balance-Leahy Scale: Fair Standing balance comment: washing hands at sink in bathroom                             Pertinent Vitals/Pain Pain Assessment Pain Assessment: No/denies pain    Home Living Family/patient expects to be discharged to:: Private residence Living Arrangements: Alone Available Help at Discharge: Available PRN/intermittently;Family Type of Home: Apartment Home Access: Elevator       Home Layout: One level Home Equipment: Cane - single point;Shower seat Additional Comments: is getting a walker and a smaller shower chair  Prior Function Prior Level of Function : Independent/Modified Independent             Mobility Comments: hadn't been using a walker, driving ADLs Comments: independent with ADLs, has groceries delievered, has a lady that cleans for her     Hand Dominance   Dominant Hand: Right    Extremity/Trunk Assessment   Upper Extremity  Assessment Upper Extremity Assessment: Defer to OT evaluation    Lower Extremity Assessment Lower Extremity Assessment: Overall WFL for tasks assessed    Cervical / Trunk Assessment Cervical / Trunk Assessment: Normal  Communication   Communication: No difficulties  Cognition Arousal/Alertness: Awake/alert Behavior During Therapy: WFL for tasks assessed/performed Overall Cognitive Status: Within Functional Limits for tasks assessed                                          General Comments General comments (skin integrity, edema, etc.): daughter present and reports rollator ordered for home and that patient will have help from family intermittently; toileted in bathroom and cues for using grabbar sit to stand    Exercises     Assessment/Plan    PT Assessment All further PT needs can be met in the next venue of care  PT Problem List Decreased strength;Decreased balance;Decreased knowledge of use of DME;Decreased mobility;Decreased safety awareness       PT Treatment Interventions      PT Goals (Current goals can be found in the Care Plan section)  Acute Rehab PT Goals PT Goal Formulation: All assessment and education complete, DC therapy    Frequency       Co-evaluation               AM-PAC PT "6 Clicks" Mobility  Outcome Measure Help needed turning from your back to your side while in a flat bed without using bedrails?: None Help needed moving from lying on your back to sitting on the side of a flat bed without using bedrails?: None Help needed moving to and from a bed to a chair (including a wheelchair)?: A Little Help needed standing up from a chair using your arms (e.g., wheelchair or bedside chair)?: A Little Help needed to walk in hospital room?: A Little Help needed climbing 3-5 steps with a railing? : Total 6 Click Score: 18    End of Session Equipment Utilized During Treatment: Gait belt Activity Tolerance: Patient tolerated  treatment well Patient left: in chair;with call bell/phone within reach;with family/visitor present   PT Visit Diagnosis: Other abnormalities of gait and mobility (R26.89);Muscle weakness (generalized) (M62.81)    Time: 1572-6203 PT Time Calculation (min) (ACUTE ONLY): 21 min   Charges:   PT Evaluation $PT Eval Moderate Complexity: 1 Mod          Cyndi Court Gracia, PT Acute Rehabilitation Services Office:[phone] 09/29/2022   Reginia Naas 09/29/2022, 1:11 PM

## 2022-09-29 NOTE — Discharge Instructions (Signed)
Call Primary Care Physician for lab work on 1/29 Call Primary Care Physician for follow up appointment in 1-2 weeks

## 2022-09-29 NOTE — Discharge Summary (Signed)
Physician Discharge Summary  Patient ID: MICHA ERCK MRN: [id number] DOB/AGE: 11-03-1940 82 y.o.  Admit date: 09/26/2022 Discharge date: 09/29/2022  Admission Diagnoses:  Discharge Diagnoses:  Principal Problem:   Hyponatremia Active Problems:   Controlled diabetes mellitus type 2 with complications ([place])   Mixed hyperlipidemia   Depression with anxiety   Essential hypertension   CAD (coronary artery disease)   Acute cystitis   Diabetic peripheral neuropathy (HCC)   Stage 3b chronic kidney disease (HCC)   Hx of completed stroke   Coagulation disorder (HCC)   Elevated lipase   Anorexia   UTI secondary to E. coli and Aerococcus pursue.   Lung nodules.    Discharged Condition: stable  Hospital Course: Patient is an 82 year old female with past medical history of CAD, chronic pain syndrome, anxiety, history of CVA, diabetes mellitus, hypercholesterolemia, neuromuscular disorder and hypertension.  Patient's problem started with significant headache and nausea.  Patient had lab work done that revealed sodium of 118.  Patient was admitted for further assessment and management.  UA done was suggestive of UTI.  Urine culture grew E. coli and Aerococcus species.  Patient was managed with Zosyn during the hospital stay.  Patient be discharged home on Augmentin.  Headache and nausea have resolved.  CT scan of the chest done during the hospital stay revealed new pulmonary nodules.  Patient already follows up with the pulmonologist.  Patient will continue to follow-up with pulmonary team.  Patient will be discharged back onto the care of the primary care provider and the pulmonary team.  Please repeat BMP in 2 days (10/01/2022).  Sodium prior to discharge was 130.  Patient and patient's daughter are eager for patient to be discharged.   Hyponatremia: -Admitted with sodium of 118. -Patient has headache and significant nausea prior to the admission.  Patient also was on celecoxib, gabapentin and  venlafaxine. -Nausea has resolved. -Headache is also resolved. -Will continue to hold celecoxib, gabapentin and venlafaxine. -Repeat BMP on 10/01/2022. -Osmolality was 207.  I have not visualize serum osmolality. -TSH was 2.79. -Cortisol level was not visualized.    Acute cystitis -Urine culture grew E. coli and Aerococcus species.   -Patient has been on IV Zosyn. -Will discharge patient on oral Augmentin.      Elevated lipase -Unclear significance.   LFTs normal.  Abdominal exam benign.    Controlled diabetes mellitus type 2 (HCC)  Diabetic peripheral neuropathy (HCC) -Borderline, not on meds at home.  Monitor -Blood sugars good on regular diet here    Mixed hyperlipidemia -Stable, atorvastatin, Zetia resumed    Depression with anxiety -Venlafaxine held    Essential hypertension -Stable, losartan, metoprolol, Norvasc resumed    CAD (coronary artery disease) -Stable, losartan, metoprolol, atorvastatin, Zetia, aspirin, Plavix resumed    History of CVA (HCC) -Stable, aspirin, Plavix, Zetia, atorvastatin resumed      Consults: None  Significant Diagnostic Studies:  Urine culture grew: E. coli and Aerococcus species.   CT chest without contrast revealed: 1. Interval development of a 8 mm sub solid pulmonary nodule within the subpleural right lower lobe, indeterminate. Additional bibasilar pulmonary nodules are stable. As noted previously, given the development of multiple bibasilar pulmonary nodules and non hypermetabolic appearance of the left upper lobe pulmonary nodule, an atypical or fungal infectious etiology is favored. Close attention on follow-up examinations is warranted. 2. Stable 19 mm nodule within the left apex, as noted previously, not hypermetabolic on a PET-CT examination of 04/20/2022.   Aortic Atherosclerosis  CT head without contrast revealed: 1. No evidence of acute intracranial abnormality. 2. Moderate chronic microvascular ischemic  disease.  Discharge Exam: Blood pressure (!) 143/59, pulse 65, temperature 98.4 F (36.9 C), temperature source Oral, resp. rate 17, height '4\' 11"'$  (1.499 m), weight 55.5 kg, SpO2 95 %.   Disposition: Discharge disposition: 01-Home or Self Care       Discharge Instructions     Diet - low sodium heart healthy   Complete by: As directed    Increase activity slowly   Complete by: As directed       Allergies as of 09/29/2022       Reactions   Macrobid [nitrofurantoin] Nausea Only   Severe nausea   Sulfa Antibiotics Itching   Niacin And Related Other (See Comments)   Must take "Flush-free"   Codeine Nausea Only   Prednisone Itching, Rash   Rocephin [ceftriaxone] Itching   Sulfonamide Derivatives Itching   Tetracycline Itching, Rash        Medication List     STOP taking these medications    celecoxib 50 MG capsule Commonly known as: CELEBREX   Cinnamon 500 MG Tabs   dexlansoprazole 60 MG capsule Commonly known as: DEXILANT   dicyclomine 10 MG capsule Commonly known as: BENTYL   FOCUSED MIND PO   gabapentin 300 MG capsule Commonly known as: NEURONTIN   Krill Oil 1000 MG Caps   magnesium oxide 400 MG tablet Commonly known as: MAG-OX   metoprolol-hydrochlorothiazide 50-25 MG tablet Commonly known as: LOPRESSOR HCT   TURMERIC PO   venlafaxine XR 37.5 MG 24 hr capsule Commonly known as: EFFEXOR-XR   vitamin E 1000 UNIT capsule       TAKE these medications    acetaminophen 325 MG tablet Commonly known as: TYLENOL Take 650 mg by mouth every 6 (six) hours as needed for moderate pain.   amLODipine 5 MG tablet Commonly known as: NORVASC Take 1 tablet (5 mg total) by mouth daily.   amoxicillin-clavulanate 875-125 MG tablet Commonly known as: AUGMENTIN Take 1 tablet by mouth 2 (two) times daily for 5 days.   aspirin EC 81 MG tablet Take 81 mg by mouth every morning.   atorvastatin 80 MG tablet Commonly known as: LIPITOR TAKE ONE TABLET BY  MOUTH EVERYDAY AT BEDTIME What changed: See the new instructions.   clopidogrel 75 MG tablet Commonly known as: PLAVIX TAKE ONE CAPSULE BY MOUTH EVERY MORNING What changed:  how much to take when to take this   DAILY FIBER PO Take 1 capsule by mouth daily.   ezetimibe 10 MG tablet Commonly known as: ZETIA TAKE ONE TABLET BY MOUTH EVERY MORNING What changed: when to take this   Fish Oil 1000 MG Caps Take 1,000 mg by mouth in the morning and at bedtime.   losartan 25 MG tablet Commonly known as: COZAAR Take 1 tablet (25 mg total) by mouth daily.   metoprolol succinate 25 MG 24 hr tablet Commonly known as: TOPROL-XL Take 3 tablets (75 mg total) by mouth every morning. Start taking on: September 30, 2022 What changed: medication strength   nitroGLYCERIN 0.4 MG SL tablet Commonly known as: NITROSTAT DISSOLVE 1 TABLET UNDER THE TONGUE EVERY 5 MINUTES AS NEEDED FOR CHEST PAIN. DO NOT EXCEED A TOTAL OF 3 DOSES IN 15 MINUTES. What changed: See the new instructions.   pantoprazole 40 MG tablet Commonly known as: PROTONIX TAKE ONE TABLET BY MOUTH ONCE DAILY   Vitamin C 500 MG Caps Take  500 mg by mouth in the morning and at bedtime.        Time spent: 35 minutes.   SignedBonnell Public 09/29/2022, 12:16 PM

## 2022-10-01 ENCOUNTER — Encounter: Payer: Self-pay | Admitting: *Deleted

## 2022-10-01 ENCOUNTER — Telehealth: Payer: Self-pay | Admitting: *Deleted

## 2022-10-01 NOTE — Patient Outreach (Signed)
Care Coordination [facility] Note Transition Care Management Follow-up Telephone Call Date of discharge and from where: Saturday, 09/29/22, [facility]; hyponatremia; UTI How have you been since you were released from the hospital? "I am doing just fine-- everything is in place.  I have switched PCP doctors-- and I went this morning to see my new PCP at [facility]; I am on my way home now.  The went over all of medicines and they did the lab work that needed to be done.  I have heard from the home health team, they are coming today at 2 pm.  My family is taking great care of me, but I am able to do everything I need to on my own, my family just helps if I need something.  I get MOW, and my family makes sure I have plenty of food at my home.  My daughter is staying with me during the night, until I get stronger" Any questions or concerns? No  Items Reviewed: Did the pt receive and understand the discharge instructions provided? Yes  Medications obtained and verified? Yes - verified patient has heard from [name] and expects antibiotic to be delivered today; confirms she has spoken to [facility] about her medications post-hospital discharge; she denies questions/ concerns around medications; reports she self-manages medications Other? No  Any new allergies since your discharge? No  Dietary orders reviewed? Yes Do you have support at home? Yes  reports she is essentially independent in self-care activities; family assists as/ if needed  Home Care and Equipment/Supplies: Were home health services ordered? yes If so, what is the name of the agency? [facility]  Has the agency set up a time to come to the patient's home? Yes- today at 2:00 pm, per patient report Were any new equipment or medical supplies ordered?  No What is the name of the medical supply agency? N/A Were you able to get the supplies/equipment? not applicable Do you have any questions related to the use of the equipment or supplies? No  N/A  Functional Questionnaire: (I = Independent and D = Dependent) ADLs: I  Bathing/Dressing- I  Meal Prep- I  Eating- I  Maintaining continence- I  Transferring/Ambulation- I  Managing Meds- I  Follow up appointments reviewed:  PCP Hospital f/u appt confirmed? Yes  Scheduled to see PCP on today @ reports she is just leaving PCP office visit at time of [facility] call- reports has recently switched PCP to [facility] f/u appt confirmed? No  Scheduled to see - on - @ - Are transportation arrangements needed? No  If their condition worsens, is the pt aware to call PCP or go to the Emergency Dept.? Yes Was the patient provided with contact information for the PCP's office or ED? No- patient declined; reports already has contact information for all care providers Was to pt encouraged to call back with questions or concerns? Yes  SDOH assessments and interventions completed:   Yes SDOH Interventions Today    Flowsheet Row Most Recent Value  SDOH Interventions   Food Insecurity Interventions Intervention Not Indicated  Transportation Interventions Intervention Not Indicated  [daughter provides transportation]      Interventions Today    Flowsheet Row Most Recent Value  Nutrition Interventions   Nutrition Discussed/Reviewed Nutrition Discussed  Pharmacy Interventions   Pharmacy Dicussed/Reviewed Pharmacy Topics Reviewed       Care Coordination Interventions:  See above per interventions    Encounter Outcome:  Pt. Visit Completed  Oneta Rack, RN, BSN, CCRN Alumnus RN CM Care Coordination/ Transition of [place] Management [phone]: direct office

## 2022-10-24 ENCOUNTER — Ambulatory Visit
Admission: RE | Admit: 2022-10-24 | Discharge: 2022-10-24 | Disposition: A | Discharge status: Home / Self Care | Payer: Medicare HMO | Source: Ambulatory Visit | Attending: Family | Admitting: Family

## 2022-10-24 ENCOUNTER — Other Ambulatory Visit: Payer: Self-pay | Admitting: Family

## 2022-10-24 DIAGNOSIS — M549 Dorsalgia, unspecified: Secondary | ICD-10-CM

## 2022-10-24 LAB — LAB REPORT - SCANNED: EGFR: 68

## 2022-11-05 ENCOUNTER — Telehealth: Payer: Self-pay

## 2022-11-05 ENCOUNTER — Ambulatory Visit: Payer: Medicare HMO

## 2022-11-05 NOTE — Telephone Encounter (Signed)
Called patient lvm to return call, to complete AWV at [phone].  If no return call within 10-20 minutes, patient may reschedule for the next available appointment with [facility] or [facility].  Isolde Skaff N. Jordani Nunn, LPN. [facility] Team Direct Dial: [phone]

## 2022-11-07 ENCOUNTER — Telehealth: Payer: Self-pay

## 2022-11-07 NOTE — Telephone Encounter (Signed)
Mailbox full; not able to leave voicemail for patient to reschedule AWV with [facility].  Jamae Tison N. Maida Widger, LPN. [facility] Team Direct Dial: [phone]

## 2022-11-23 ENCOUNTER — Ambulatory Visit (HOSPITAL_BASED_OUTPATIENT_CLINIC_OR_DEPARTMENT_OTHER)
Admission: RE | Admit: 2022-11-23 | Discharge: 2022-11-23 | Disposition: A | Discharge status: Home / Self Care | Payer: Medicare HMO | Source: Ambulatory Visit | Attending: Pulmonary Disease | Admitting: Pulmonary Disease

## 2022-11-23 DIAGNOSIS — R918 Other nonspecific abnormal finding of lung field: Secondary | ICD-10-CM | POA: Insufficient documentation

## 2022-11-27 ENCOUNTER — Encounter (HOSPITAL_BASED_OUTPATIENT_CLINIC_OR_DEPARTMENT_OTHER): Payer: Self-pay | Admitting: Pulmonary Disease

## 2022-11-27 ENCOUNTER — Ambulatory Visit (HOSPITAL_BASED_OUTPATIENT_CLINIC_OR_DEPARTMENT_OTHER): Payer: Medicare HMO | Admitting: Pulmonary Disease

## 2022-11-27 VITALS — BP 118/62 | HR 69 | Ht 59.0 in | Wt 123.7 lb

## 2022-11-27 DIAGNOSIS — R911 Solitary pulmonary nodule: Secondary | ICD-10-CM | POA: Diagnosis not present

## 2022-11-27 NOTE — Patient Instructions (Signed)
PET-avid left upper lobe lung nodule measuring 2.0 x 1.5 cm --Prior bronchoscopy. No malignancy however recommend continued surveillance based on PET-avidity and size of lung --CT chest reviewed. Discussed risks and benefits of surveillance imaging. Addressed questions and concerns --ORDER CT Chest without contrast in 1 year

## 2022-11-27 NOTE — Progress Notes (Signed)
Subjective:   PATIENT ID: [name] [name] GENDER: female DOB: 10/17/1940, MRN: [id number]   HPI  Chief Complaint  Patient presents with   Follow-up    F/U for CT scan results.     Reason for Visit: Follow-up   [name] is an 82 year old female never smoker with CAD s/p DES x 4 (last 2021), HTN, DM2, GERD, hx blood clots related to trauma s/p excision who presents for follow-up.  Initial consult She recently was seen by her PCP for fever and fatigue with no improvement on Z-pack. Dx with UTI however noted to have abnormal CXR leading to evaluation with CT Chest with demonstrated nodular density measuring 19 x 14 mm in the left apex concerning for malignancy. No mediastinal or hilar adenopathy.  At baseline she sometimes ambulate with a cane. Currently living independently and perform ADLs but is moving to a senior assistance for cleaning and meal assistance. Daughter reports she is sometimes a fall risk.  She walks up stairs slowly and will get winded. Denies cough, wheezing, hemoptysis, unintentional weight loss, night sweats or unexplained fevers/chills.  04/30/22 Daughter present to provide additional history. Reports intermittent low-grade fevers and fatigue. Denies significant weight loss or appetite change. Denies night sweats.  05/28/22 Daughter present and provides additional history with patient. Over two weeks ago she reports mechanical fall with left facial bruising and left knee swelling and bruising. Leg is tender with walking. She underwent navigational bronchoscopy on 9/14 which she tolerated well Denies shortness of breath, cough or wheezing. No unintentional weight loss, night sweats, unexplained fevers or chills.  07/30/22 Daughter presents. Recently had fever with UTI. Denies shortness of breath, cough or wheezing. No unintentional weight loss, change in appetite, night sweats.  11/27/22 She reports cough with pollen but otherwise no symptoms. Denies  shortness of breath or wheezing. Daughter, daughter and granddaughter (1 year) was present. She states that we also care for her family member Miachel Roux.  Social History: Never smoker Significant second smoke exposure Mother with pancreatic cancer Brother with lung cancer, heavy smoker Brother with laryngeal cancer, lived near [place]  Environmental exposures: Significant mold in apartment  Past Medical History:  Diagnosis Date   Allergy    Anal fissure    Anxiety    CAD (coronary artery disease)    s/p inf MI 2007 - tx w/ DES to RCA // s/p POBA to [facility] and DES to [facility] in 11/18 ([facility] in [place], [place]) // Myoview 3/21: low risk // s/p DES to pRCA   Cataract    removed both eyes   Cerebrovascular disease    Carotid US 09/2017 [facility]): mild plaque (<50%) in both carotid arteries   Chronic neck pain    Chronic pain syndrome    Diabetes Mellitus, Type 2    Diverticular disease    DJD (degenerative joint disease)    Echocardiogram 10/2018    Echo 10/2018: EF 55-60, normal RVSF, mod MAC, mod TR, severe AoV calcification and sclerosis with nodular calcium/mobile area of calcium in the LVOT (small veg vs Lambl's excrescence  - consider TEE), mild AI, mild AS (mean 11).    Echocardiogram 04/2019    Echocardiogram 04/2019: EF 60-65, basal septal hypertrophy, grade 2 diastolic dysfunction, normal wall motion, normal RV SF, mild LAE, mod MAC, trivial MR, mod sclerosis of the aortic valve with mod aortic annular calcification, thin mobile filamentous structure on ventricular side of AV likely representing Lambl's excrescence, mild AI, mild  TR   Frequent UTI's    Gastroesophageal reflux disease    H/O bacterial endocarditis    Rx w IV Abxs in 2020 ([facility] in [place], [place]) // F/u echo with AoV Lambl's excrescence   H/O hiatal hernia    HTN (hypertension)    Hx of fall 10/2020   Hx of MI 2007   Hx of Stroke    IBS (irritable bowel syndrome)    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 (Cervical Fusion)   Premature ventricular contractions    Tubular adenoma of colon    Ulcer    [facility]   Urinary incontinence      Family History  Problem Relation Age of Onset   Heart disease Mother    Pancreatic cancer Mother    Lung cancer Brother    Cancer - Other Brother        vocal cord cancer   Colon polyps Sister    Coronary artery disease Other        sibling   Coronary artery disease Other        sibling   Colon cancer Neg Hx    Esophageal cancer Neg Hx    Rectal cancer Neg Hx    Stomach cancer Neg Hx      Social History   Occupational History    Employer: RETIRED  Tobacco Use   Smoking status: Never    Passive exposure: Never   Smokeless tobacco: Never  Vaping Use   Vaping Use: Never used  Substance and Sexual Activity   Alcohol use: No   Drug use: No   Sexual activity: Yes    Partners: Male    Allergies  Allergen Reactions   Macrobid [Nitrofurantoin] Nausea Only    Severe nausea   Sulfa Antibiotics Itching   Niacin And Related Other (See Comments)    Must take "Flush-free"   Codeine Nausea Only   Prednisone Itching and Rash   Rocephin [Ceftriaxone] Itching   Sulfonamide Derivatives Itching   Tetracycline Itching and Rash     Outpatient Medications Prior to Visit  Medication Sig Dispense Refill   acetaminophen (TYLENOL) 325 MG tablet Take 650 mg by mouth every 6 (six) hours as needed for moderate pain.     amLODipine (NORVASC) 5 MG tablet Take 1 tablet (5 mg total) by mouth daily. 90 tablet 3   Ascorbic Acid (VITAMIN C) 500 MG CAPS Take 500 mg by mouth in the morning and at bedtime.     aspirin EC 81 MG tablet Take 81 mg by mouth  every morning.      atorvastatin (LIPITOR) 80 MG tablet TAKE ONE TABLET BY MOUTH EVERYDAY AT BEDTIME (Patient taking differently: Take 80 mg by mouth at bedtime.) 90 tablet 0   clopidogrel (PLAVIX) 75 MG tablet TAKE ONE CAPSULE BY MOUTH EVERY MORNING (Patient taking differently: Take 75 mg by mouth daily.) 90 tablet 0   ezetimibe (ZETIA) 10 MG tablet TAKE ONE TABLET BY MOUTH EVERY MORNING (Patient taking differently: Take 10 mg by mouth daily.) 90 tablet 0   losartan (COZAAR) 25 MG tablet Take 1 tablet (  25 mg total) by mouth daily. 90 tablet 3   nitroGLYCERIN (NITROSTAT) 0.4 MG SL tablet DISSOLVE 1 TABLET UNDER THE TONGUE EVERY 5 MINUTES AS NEEDED FOR CHEST PAIN. DO NOT EXCEED A TOTAL OF 3 DOSES IN 15 MINUTES. (Patient taking differently: Place 0.4 mg under the tongue every 5 (five) minutes as needed for chest pain.) 25 tablet 2   Omega-3 Fatty Acids (FISH OIL) 1000 MG CAPS Take 1,000 mg by mouth in the morning and at bedtime.     pantoprazole (PROTONIX) 40 MG tablet TAKE ONE TABLET BY MOUTH ONCE DAILY 90 tablet 0   Psyllium (DAILY FIBER PO) Take 1 capsule by mouth daily.     metoprolol succinate (TOPROL-XL) 25 MG 24 hr tablet Take 3 tablets (75 mg total) by mouth every morning. 90 tablet 0   No facility-administered medications prior to visit.    Review of Systems  Constitutional:  Negative for chills, diaphoresis, fever, malaise/fatigue and weight loss.  HENT:  Negative for congestion.   Respiratory:  Negative for cough, hemoptysis, sputum production, shortness of breath and wheezing.   Cardiovascular:  Negative for chest pain, palpitations and leg swelling.     Objective:   Vitals:   11/27/22 1056  BP: 118/62  Pulse: 69  SpO2: 97%  Weight: 123 lb 10.9 oz (56.1 kg)  Height: 4\' 11"  (1.499 m)    SpO2: 97 % (on RA)  Physical Exam: General: Elderly, frail-appearing, no acute distress HENT: [place], AT Eyes: EOMI, no scleral icterus Respiratory: Clear to auscultation bilaterally.  No  crackles, wheezing or rales Cardiovascular: RRR, -M/R/G, no JVD Extremities:-Edema,-tenderness Neuro: AAO x4, CNII-XII grossly intact Psych: Normal mood, normal affect   Data Reviewed:  Imaging: CT Chest 03/22/22 - nodular density measuring 19 x 14 mm in the left apex. No hilar or mediastinal adenopathy PET 0000000 - Hypermetabolic 1.8 cm LUL apical lung nodule. No adenopathy or mets. CT Chest 07/18/22 - unchanged LUL apical nodule. Few basilar nodules developed in the interval in bases CT Chest 11/23/22 - Similar LUL apical nodule 2.0 x 1.5 cm  PFT: None on file  Labs: CBC    Component Value Date/Time   WBC 7.3 09/29/2022 0317   RBC 4.70 09/29/2022 0317   HGB 14.6 09/29/2022 0317   HGB 14.3 12/21/2021 1538   HCT 42.5 09/29/2022 0317   HCT 42.7 12/21/2021 1538   PLT 164 09/29/2022 0317   PLT 239 12/21/2021 1538   MCV 90.4 09/29/2022 0317   MCV 94 12/21/2021 1538   MCH 31.1 09/29/2022 0317   MCHC 34.4 09/29/2022 0317   RDW 12.4 09/29/2022 0317   RDW 12.3 12/21/2021 1538   LYMPHSABS 2.1 09/27/2022 0712   MONOABS 0.7 09/27/2022 0712   EOSABS 0.2 09/27/2022 0712   BASOSABS 0.1 09/27/2022 0712   Absolute 03/05/22 - 200     Assessment & Plan:   Discussion: 82 year old female never smoker with CAD s/p DES x 4 (last 2021), HTN, DM2, GERD and hx blood clots secondary to trauma s/p excision who presents for follow-up. Prior bronchoscopy 05/17/22 for PET avid LUL nodule measuring 1.8 cm was negative for malignancy. Despite no evidence of malignancy on biopsy concerned for false negative given PET avidity of nodule. On surveillance imaging nodule marginally enlarged. We extensively discussed repeat diagnostic bronchoscopy, empiric radiation vs further surveillance. With shared decision making patient wished to pursue surveillance CT. Understanding that this could enlarge and that her advanced age my reduce the feasibility for procedures +/-  treatment in the future  PET-avid left upper  lobe lung nodule measuring 2.0 x 1.5 cm --Prior bronchoscopy. No malignancy however recommend continued surveillance based on PET-avidity and size of lung --CT chest reviewed. Discussed risks and benefits of surveillance imaging. Addressed questions and concerns --ORDER CT Chest without contrast in 1 year   Health Maintenance Immunization History  Administered Date(s) Administered   Fluad Quad(high Dose 65+) 04/30/2019, 06/27/2020, 05/12/2021, 07/09/2022   Influenza Split 07/18/2011, 05/19/2012   Influenza Whole 06/22/2009, 05/15/2010   Influenza, High Dose Seasonal PF 07/20/2015, 05/24/2016, 06/18/2018   Influenza,inj,Quad PF,6+ Mos 06/15/2013, 05/21/2014   Influenza-Unspecified 06/24/2017   Pneumococcal Conjugate-13 01/20/2014   Pneumococcal Polysaccharide-23 11/08/2014, 11/13/2017   Tdap 01/20/2014, 05/11/2022   Zoster, Live 07/08/2014   CT Lung Screen - not qualified due to age  Orders Placed This Encounter  Procedures   CT Chest Wo Contrast    Standing Status:   Future    Standing Expiration Date:   11/27/2023    Scheduling Instructions:     Schedule for CT Chest in 1 year (11/2023)    Order Specific Question:   Preferred imaging location?    Answer:   [facility]  No orders of the defined types were placed in this encounter.   Return in about 1 year (around 11/27/2023) for routine follow-up, with Dr. Loanne Drilling.   I have spent a total time of 35-minutes on the day of the appointment including chart review, data review, collecting history, coordinating care and discussing medical diagnosis and plan with the patient/family. Past medical history, allergies, medications were reviewed. Pertinent imaging, labs and tests included in this note have been reviewed and interpreted independently by me.  [place], MD [facility] 11/27/2022  Office Number [phone]

## 2022-12-16 NOTE — Progress Notes (Unsigned)
Office Visit    Patient Name: [name] [name] Date of Encounter: 12/16/2022  Primary Care Provider:  Raymon Mutton., FNP Primary Cardiologist:  Tonny Bollman, MD Primary Electrophysiologist: None  Chief Complaint    [name] [name] is a 82 y.o. female with PMH of CAD s/p inferior MI in 2007 treated with DES to RCA, POBA to [facility] and DES to distal RCA 07/2017, LHC 03/2020 treated with DES x 1 to mid RCA HTN, HLD, DM type II, CVA, bacterial endocarditis treated with antibiotics who presents today for 69-month follow-up of CAD.  Past Medical History    Past Medical History:  Diagnosis Date   Allergy    Anal fissure    Anxiety    CAD (coronary artery disease)    s/p inf MI 2007 - tx w/ DES to RCA // s/p POBA to [facility] and DES to dRCA in 11/18 ([facility] in [place], [place]) // Myoview 3/21: low risk // s/p DES to pRCA   Cataract    removed both eyes   Cerebrovascular disease    Carotid US 09/2017 [employer]): mild plaque (<50%) in both carotid arteries   Chronic neck pain    Chronic pain syndrome    Diabetes Mellitus, Type 2    Diverticular disease    DJD (degenerative joint disease)    Echocardiogram 10/2018    Echo 10/2018: EF 55-60, normal RVSF, mod MAC, mod TR, severe AoV calcification and sclerosis with nodular calcium/mobile area of calcium in the LVOT (small veg vs Lambl's excrescence  - consider TEE), mild AI, mild AS (mean 11).    Echocardiogram 04/2019    Echocardiogram 04/2019: EF 60-65, basal septal hypertrophy, grade 2 diastolic dysfunction, normal wall motion, normal RV SF, mild LAE, mod MAC, trivial MR, mod sclerosis of the aortic valve with mod aortic annular calcification, thin mobile filamentous structure on ventricular side of AV likely representing Lambl's excrescence, mild AI, mild TR   Frequent UTI's    Gastroesophageal reflux disease    H/O bacterial endocarditis    Rx w IV Abxs in 2020 ([facility] in [place], [place]) // F/u echo with AoV Lambl's excrescence   H/O  hiatal hernia    HTN (hypertension)    Hx of fall 10/2020   Hx of MI 2007   Hx of Stroke    IBS (irritable bowel syndrome)    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 (Cervical Fusion)   Premature ventricular contractions    Tubular adenoma of colon    Ulcer    sam  gi   Urinary incontinence    Past Surgical History:  Procedure Laterality Date   APPENDECTOMY  1955   BLADDER REPAIR  2007   BRONCHIAL BIOPSY  05/17/2022   Procedure: BRONCHIAL BIOPSIES;  Surgeon: Omar Person, MD;  Location: [facility] ENDOSCOPY;  Service: Pulmonary;;   BRONCHIAL BRUSHINGS  05/17/2022   Procedure: BRONCHIAL BRUSHINGS;  Surgeon: Omar Person, MD;  Location: [facility] ENDOSCOPY;  Service: Pulmonary;;   BRONCHIAL NEEDLE ASPIRATION BIOPSY  05/17/2022   Procedure: BRONCHIAL NEEDLE ASPIRATION BIOPSIES;  Surgeon: Omar Person, MD;  Location: [facility] ENDOSCOPY;  Service: Pulmonary;;   CARDIAC CATHETERIZATION  2007   Stents   CERVICAL SPINE SURGERY  07/2005   Dr Aurelia East   CHOLECYSTECTOMY  2007   COLONOSCOPY     CORONARY PRESSURE/FFR WITH 3D MAPPING N/A 06/28/2021   Procedure: Coronary Pressure Wire/FFR w/3D Mapping;  Surgeon: Kathleene Hazel, MD;  Location: [facility] INVASIVE CV LAB;  Service: Cardiovascular;  Laterality: N/A;   CORONARY STENT INTERVENTION N/A 03/09/2020   Procedure: CORONARY STENT INTERVENTION;  Surgeon: Kathleene Hazel, MD;  Location: [facility] INVASIVE CV LAB;  Service: Cardiovascular;  Laterality: N/A;   CORONARY STENT INTERVENTION N/A 06/28/2021   Procedure: CORONARY STENT INTERVENTION;  Surgeon: Kathleene Hazel, MD;  Location: [facility] INVASIVE CV LAB;  Service: Cardiovascular;  Laterality:  N/A;   DENTAL SURGERY  05/2013   replaced an inplant   EYE SURGERY  2011   Bilteral   FIDUCIAL MARKER PLACEMENT  05/17/2022   Procedure: FIDUCIAL MARKER PLACEMENT;  Surgeon: Omar Person, MD;  Location: [facility] ENDOSCOPY;  Service: Pulmonary;;  LUL   I & D KNEE WITH POLY EXCHANGE  09/25/2011   Procedure: IRRIGATION AND DEBRIDEMENT KNEE WITH POLY EXCHANGE;  Surgeon: Eulas Post, MD;  Location: [facility] OR;  Service: Orthopedics;  Laterality: Left;   INCISION AND DRAINAGE ABSCESS / HEMATOMA OF BURSA / KNEE / THIGH  09/2011   JOINT REPLACEMENT  2012   left   LEFT HEART CATH AND CORONARY ANGIOGRAPHY N/A 03/09/2020   Procedure: LEFT HEART CATH AND CORONARY ANGIOGRAPHY;  Surgeon: Kathleene Hazel, MD;  Location: [facility] INVASIVE CV LAB;  Service: Cardiovascular;  Laterality: N/A;   LEFT HEART CATH AND CORONARY ANGIOGRAPHY N/A 06/28/2021   Procedure: LEFT HEART CATH AND CORONARY ANGIOGRAPHY;  Surgeon: Kathleene Hazel, MD;  Location: [facility] INVASIVE CV LAB;  Service: Cardiovascular;  Laterality: N/A;   left Total Knee Replacement  11/2010   Dr Eulah Pont   NECK SURGERY     has had 3 surgeries   POLYPECTOMY     POSTERIOR CERVICAL FUSION/FORAMINOTOMY  04/08/2012   Procedure: POSTERIOR CERVICAL FUSION/FORAMINOTOMY LEVEL 2;  Surgeon: Clydene Fake, MD;  Location: [facility] NEURO ORS;  Service: Neurosurgery;  Laterality: Left;  Left Cervical six-seven Foraminotomy, bilateral cervical seven-thoracic one foraminotomy, cervical six-seven, cervical seven-thoracic one fusion with posterior instrumentation   TEMPOROMANDIBULAR JOINT SURGERY     thumb surgery     TONSILLECTOMY     TONSILLECTOMY  1950   TOTAL ABDOMINAL HYSTERECTOMY     VIDEO BRONCHOSCOPY WITH RADIAL ENDOBRONCHIAL ULTRASOUND  05/17/2022   Procedure: VIDEO BRONCHOSCOPY WITH RADIAL ENDOBRONCHIAL ULTRASOUND;  Surgeon: Omar Person, MD;  Location: [name] ENDOSCOPY;  Service: Pulmonary;;    Allergies  Allergies  Allergen Reactions   Macrobid  [Nitrofurantoin] Nausea Only    Severe nausea   Sulfa Antibiotics Itching   Niacin And Related Other (See Comments)    Must take "Flush-free"   Codeine Nausea Only   Prednisone Itching and Rash   Rocephin [Ceftriaxone] Itching   Sulfonamide Derivatives Itching   Tetracycline Itching and Rash    History of Present Illness    BESSIE BOYTE  is a 82 year old female with the above mention past medical history who presents today for 49-month follow-up.  Ms. brubeck had an inferior wall MI in 2007 that was treated with PCI/DES to RCA.  She had a repeat LHC in 2010 that showed moderate LAD stenosis with patent RCA stent.  Lexiscan Myoview was completed in 2013 that showed no  ischemia.  She relocated to [place] and had been followed by [facility] cardiology in [facility] and underwent [facility] 07/2017 with balloon angioplasty to the mid RCA stent and placement of a DES to the distal RCA overlapping. Mod disease to mid LAD not hemodynamically significant by FFR.  She was admitted 09/2017 for TIA and TEE demonstrated vegetation of the aortic valve and she moved back to [place] in 07/2018.  She was seen in the office with complaint of palpitations and wore an event monitor that showed no atrial fibrillation.  She was seen in follow-up 04/2019 and had 2D echo completed showing EF of 60 to 65% with moderate basal septal hypertrophy and degenerative MV with trivial TR, AV vegetation / mobile filamentous structure (Lambels excresence).  She was seen in follow-up on 3/21 and endorsed chest discomfort and palpitations.  Beta-blocker was increased during visit.  She was seen for subsequent follow-up 02/2020 with ongoing chest discomfort and LHC was elected for further evaluation.  Heart cath revealed patent mid RCA with high-grade disease in the proximal RCA at previous stent.  She was treated with DES x 1.  She suffered a fall 05/17/2021 with facial injury and normal CT.  She was seen 06/2021 and reported 1  episode of chest pain with neck pain which was her anginal equivalent.  Imdur was discontinued due to headache and amlodipine was increased to 10 mg which was decreased to 5 mg due to lower extremity edema.  Case was discussed with Dr. Excell Seltzer who agreed with repeat LHC.  Repeat LHC showed severe proximal LAD stenosis that was treated with PTCA/DES x 1 proximal LAD, patent large dominant RCA with mid stent without restenosis, and mild to moderate stenosis in the nondominant mid circumflex.  She was last seen by Dr. Excell Seltzer on 06/2022 for follow-up. During visit she endorsed rare symptoms of chest discomfort that are relieved primarily with nitroglycerin.  Blood pressure was noted to be under control.  No medication changes were made at that time.  She was hospitalized on 09/26/2022 to 09/29/2022 with complaint of hyponatremia.  During hospital stay CT scan was done of the chest that showed new pulmonary nodules.  Patient has followed up with her pulmonologist since being discharged.  Ms. Kitamura presents today with her daughter for 52-month follow-up. Since last being seen in the office patient reports no recurrence of chest pain since previous visit and October.  She has not had required any as needed nitroglycerin and is starting to get more active around her new apartment.  She does a lot of walking up and down the hallways and pushes her granddaughter in a stroller for exercise.  Her blood pressures at home are in the 120s to 130s over 60s. She is euvolemic today on examination and has no complaint of chest pain or shortness of breath.  She is tolerating her current medications without any adverse reactions.  Patient denies chest pain, palpitations, dyspnea, PND, orthopnea, nausea, vomiting, dizziness, syncope, edema, weight gain, or early satiety.   Home Medications    Current Outpatient Medications  Medication Sig Dispense Refill   acetaminophen (TYLENOL) 325 MG tablet Take 650 mg by mouth every 6 (six)  hours as needed for moderate pain.     amLODipine (NORVASC) 5 MG tablet Take 1 tablet (5 mg total) by mouth daily. 90 tablet 3   Ascorbic Acid (VITAMIN C) 500 MG CAPS Take 500 mg by mouth in the morning and at bedtime.     aspirin  EC 81 MG tablet Take 81 mg by mouth every morning.      atorvastatin (LIPITOR) 80 MG tablet TAKE ONE TABLET BY MOUTH EVERYDAY AT BEDTIME (Patient taking differently: Take 80 mg by mouth at bedtime.) 90 tablet 0   clopidogrel (PLAVIX) 75 MG tablet TAKE ONE CAPSULE BY MOUTH EVERY MORNING (Patient taking differently: Take 75 mg by mouth daily.) 90 tablet 0   ezetimibe (ZETIA) 10 MG tablet TAKE ONE TABLET BY MOUTH EVERY MORNING (Patient taking differently: Take 10 mg by mouth daily.) 90 tablet 0   losartan (COZAAR) 25 MG tablet Take 1 tablet (25 mg total) by mouth daily. 90 tablet 3   metoprolol succinate (TOPROL-XL) 25 MG 24 hr tablet Take 3 tablets (75 mg total) by mouth every morning. 90 tablet 0   nitroGLYCERIN (NITROSTAT) 0.4 MG SL tablet DISSOLVE 1 TABLET UNDER THE TONGUE EVERY 5 MINUTES AS NEEDED FOR CHEST PAIN. DO NOT EXCEED A TOTAL OF 3 DOSES IN 15 MINUTES. (Patient taking differently: Place 0.4 mg under the tongue every 5 (five) minutes as needed for chest pain.) 25 tablet 2   Omega-3 Fatty Acids (FISH OIL) 1000 MG CAPS Take 1,000 mg by mouth in the morning and at bedtime.     pantoprazole (PROTONIX) 40 MG tablet TAKE ONE TABLET BY MOUTH ONCE DAILY 90 tablet 0   Psyllium (DAILY FIBER PO) Take 1 capsule by mouth daily.     No current facility-administered medications for this visit.     Review of Systems  Please see the history of present illness.      All other systems reviewed and are otherwise negative except as noted above.  Physical Exam    Wt Readings from Last 3 Encounters:  11/27/22 123 lb 10.9 oz (56.1 kg)  09/26/22 122 lb 5.7 oz (55.5 kg)  07/30/22 122 lb 6.4 oz (55.5 kg)   ON:GEXBM were no vitals filed for this visit.,There is no height or  weight on file to calculate BMI.  Constitutional:      Appearance: Healthy appearance. Not in distress.  Neck:     Vascular: JVD normal.  Pulmonary:     Effort: Pulmonary effort is normal.     Breath sounds: No wheezing. No rales. Diminished in the bases Cardiovascular:     Normal rate. Regular rhythm. Normal S1. Normal S2.      Murmurs: There is no murmur.  Edema:    Peripheral edema absent.  Abdominal:     Palpations: Abdomen is soft non tender. There is no hepatomegaly.  Skin:    General: Skin is warm and dry.  Neurological:     General: No focal deficit present.     Mental Status: Alert and oriented to person, place and time.     Cranial Nerves: Cranial nerves are intact.  EKG/LABS/ Recent Cardiac Studies    ECG personally reviewed by me today -sinus rhythm with left axis deviation and rate of 65 bpm with no acute changes noted consistent with previous EKG. Lab Results  Component Value Date   WBC 7.3 09/29/2022   HGB 14.6 09/29/2022   HCT 42.5 09/29/2022   MCV 90.4 09/29/2022   PLT 164 09/29/2022   Lab Results  Component Value Date   CREATININE 0.62 09/29/2022   BUN 10 09/29/2022   NA 130 (L) 09/29/2022   K 4.1 09/29/2022   CL 93 (L) 09/29/2022   CO2 26 09/29/2022   Lab Results  Component Value Date   ALT 27 09/26/2022  AST 27 09/26/2022   ALKPHOS 49 09/26/2022   BILITOT 0.6 09/26/2022   Lab Results  Component Value Date   CHOL 109 10/04/2021   HDL 48.00 10/04/2021   LDLCALC 39 10/04/2021   LDLDIRECT 42.0 09/12/2020   TRIG 110.0 10/04/2021   CHOLHDL 2 10/04/2021    Lab Results  Component Value Date   HGBA1C 7.2 (H) 03/05/2022    Cardiac Studies & Procedures   CARDIAC CATHETERIZATION  CARDIAC CATHETERIZATION 06/28/2021  Narrative   Mid LAD lesion is 30% stenosed.   Prox Cx to Mid Cx lesion is 30% stenosed.   Mid RCA lesion is 10% stenosed.   Prox LAD to Mid LAD lesion is 70% stenosed.   A drug-eluting stent was successfully placed using a  STENT ONYX FRONTIER 3.0X18.   Post intervention, there is a 0% residual stenosis.   Patent mid RCA stents  Severe proximal LAD stenosis that angiographically appears moderate to severe. RFR assessment with pressure wire of 0.88. Successful PTCA/DES x 1 proximal LAD (RFR guided) Mild to moderate stenosis in the non-dominant mid Circumflex Large dominant RCA with patent mid stents without restenosis Normal LVEDP  Recommendations: Continue DAPT with ASA/Plavix. Continue beta blocker and statin.  Findings Coronary Findings Diagnostic  Dominance: Right  Left Anterior Descending Vessel is large. Prox LAD to Mid LAD lesion is 70% stenosed. Mid LAD lesion is 30% stenosed.  Left Circumflex Prox Cx to Mid Cx lesion is 30% stenosed.  Right Coronary Artery Non-stenotic Prox RCA to Mid RCA lesion was previously treated. Mid RCA lesion is 10% stenosed. The lesion was previously treated using a stent (unknown type) over 2 years ago.  Intervention  Prox LAD to Mid LAD lesion Stent [place]IST[place] GUIDE 6FR XBLAD3.5 guide catheter was inserted. Lesion crossed with guidewire. Pre-stent angioplasty was performed using a BALLN SAPPHIRE 2.5X12. A drug-eluting stent was successfully placed using a STENT ONYX FRONTIER 3.0X18. Stent strut is well apposed. Post-stent angioplasty was performed using a BALLN SAPPHIRE [place] 3.25X12. Post-Intervention Lesion Assessment The intervention was successful. Pre-interventional TIMI flow is 3. Post-intervention TIMI flow is 3. No complications occurred at this lesion. There is a 0% residual stenosis post intervention.   CARDIAC CATHETERIZATION  CARDIAC CATHETERIZATION 03/09/2020  Narrative  Mid RCA lesion is 10% stenosed.  Prox RCA to Mid RCA lesion is 95% stenosed.  A drug-eluting stent was successfully placed using a STENT RESOLUTE ONYX [id number].  Post intervention, there is a 0% residual stenosis.  Prox Cx to Mid Cx lesion is 30% stenosed.  Prox LAD to Mid  LAD lesion is 50% stenosed.  Mid LAD lesion is 30% stenosed.  1. Large, dominant RCA with patent mid stents. Severe stenosis just prior to the stented segment in the mid vessel. 2. Moderate non-obstructive proximal LAD stenosis 3. Successful PTCA/DES x 1 mid RCA  Recommendations: Continue DAPT with ASA and Plavix. Continue statin and beta blocker.  Findings Coronary Findings Diagnostic  Dominance: Right  Left Anterior Descending Vessel is large. Prox LAD to Mid LAD lesion is 50% stenosed. Mid LAD lesion is 30% stenosed.  Left Circumflex Prox Cx to Mid Cx lesion is 30% stenosed.  Right Coronary Artery Prox RCA to Mid RCA lesion is 95% stenosed. Mid RCA lesion is 10% stenosed. The lesion was previously treated using a stent (unknown type) over 2 years ago.  Intervention  Prox RCA to Mid RCA lesion Stent Pre-stent angioplasty was performed using a BALLOON SAPPHIRE 2.0X12. A drug-eluting stent was successfully placed using  a STENT RESOLUTE ONYX [id number]. Stent strut is well apposed. Post-stent angioplasty was performed using a BALLOON SAPPHIRE [place] 3.0X12. Post-Intervention Lesion Assessment The intervention was successful. Pre-interventional TIMI flow is 3. Post-intervention TIMI flow is 3. No complications occurred at this lesion. There is a 0% residual stenosis post intervention.   STRESS TESTS  MYOCARDIAL PERFUSION IMAGING 11/12/2019  Narrative  Nuclear stress EF: 73%.  There was no ST segment deviation noted during stress.  No T wave inversion was noted during stress.  The study is normal.  This is a low risk study.  The left ventricular ejection fraction is hyperdynamic (>65%).  Impression:  1. Normal myocardial perfusion imaging study without evidence of ischemia or infarction. 2. Normal LVEF, >65%.  Gerri Spore T. Flora Lipps, MD [facility] [address], [address] [place], [place] [address] [phone] 3:13 PM    ECHOCARDIOGRAM  ECHOCARDIOGRAM COMPLETE 01/15/2022  Narrative ECHOCARDIOGRAM REPORT    Patient Name:   DORIA FERN Boike Date of Exam: 01/15/2022 Medical Rec #:  [id number]       Height:       59.0 in Accession #:    [id number]      Weight:       125.6 lb Date of Birth:  11-30-1940       BSA:          1.513 m Patient Age:    81 years        BP:           144/78 mmHg Patient Gender: F               HR:           66 bpm. Exam Location:  [facility]  Procedure: 2D Echo, Cardiac Doppler, Color Doppler and Strain Analysis  Indications:    Dyspnea R06.00  History:        Patient has prior history of Echocardiogram examinations, most recent 05/01/2019. CAD, Signs/Symptoms:Chest Pain; Risk Factors:Dyslipidemia and Hypertension.  Sonographer:    Louie Boston RDCS Referring Phys: [id number] CAITLIN S WALKER  IMPRESSIONS   1. Left ventricular ejection fraction, by estimation, is 60 to 65%. The left ventricle has normal function. The left ventricle has no regional wall motion abnormalities. There is mild concentric left ventricular hypertrophy. Left ventricular diastolic parameters are consistent with Grade I diastolic dysfunction (impaired relaxation). Elevated left ventricular end-diastolic pressure. The average left ventricular global longitudinal strain is -12.8 %. The global longitudinal strain is abnormal. 2. Right ventricular systolic function is normal. The right ventricular size is normal. There is mildly elevated pulmonary artery systolic pressure. 3. Left atrial size was mildly dilated. 4. The mitral valve is degenerative. Mild mitral valve regurgitation. No evidence of mitral stenosis. 5. The aortic valve is tricuspid. Aortic valve regurgitation is mild. Aortic valve sclerosis/calcification is present, without any evidence of aortic stenosis. 6. Aortic Normal DTA. 7. The inferior vena cava is normal in size with greater than 50% respiratory variability, suggesting right atrial pressure of 3  mmHg.  FINDINGS Left Ventricle: Left ventricular ejection fraction, by estimation, is 60 to 65%. The left ventricle has normal function. The left ventricle has no regional wall motion abnormalities. The average left ventricular global longitudinal strain is -12.8 %. The global longitudinal strain is abnormal. The left ventricular internal cavity size was normal in size. There is mild concentric left ventricular hypertrophy. Left ventricular diastolic parameters are consistent with Grade I diastolic dysfunction (impaired relaxation). Elevated left ventricular end-diastolic pressure.  Right Ventricle: The right ventricular size is normal. No increase in right ventricular wall thickness. Right ventricular systolic function is normal. There is mildly elevated pulmonary artery systolic pressure. The tricuspid regurgitant velocity is 2.97 m/s, and with an assumed right atrial pressure of 3 mmHg, the estimated right ventricular systolic pressure is 38.3 mmHg.  Left Atrium: Left atrial size was mildly dilated.  Right Atrium: Right atrial size was normal in size.  Pericardium: There is no evidence of pericardial effusion.  Mitral Valve: The mitral valve is degenerative in appearance. Mild mitral annular calcification. Mild mitral valve regurgitation. No evidence of mitral valve stenosis.  Tricuspid Valve: The tricuspid valve is normal in structure. Tricuspid valve regurgitation is mild . No evidence of tricuspid stenosis.  Aortic Valve: The aortic valve is tricuspid. Aortic valve regurgitation is mild. Aortic regurgitation PHT measures 445 msec. Aortic valve sclerosis/calcification is present, without any evidence of aortic stenosis. Aortic valve mean gradient measures 10.0 mmHg. Aortic valve peak gradient measures 18.2 mmHg. Aortic valve area, by VTI measures 2.27 cm.  Pulmonic Valve: The pulmonic valve was normal in structure. Pulmonic valve regurgitation is not visualized. No evidence of pulmonic  stenosis.  Aorta: The aortic root and ascending aorta are structurally normal, with no evidence of dilitation, the aortic arch was not well visualized and Normal DTA.  Venous: A normal flow pattern is recorded from the right upper pulmonary vein. The inferior vena cava is normal in size with greater than 50% respiratory variability, suggesting right atrial pressure of 3 mmHg.  IAS/Shunts: No atrial level shunt detected by color flow Doppler.   LEFT VENTRICLE PLAX 2D LVIDd:         3.80 cm   Diastology LVIDs:         2.60 cm   LV e' medial:    5.11 cm/s LV PW:         1.30 cm   LV E/e' medial:  17.3 LV IVS:        1.20 cm   LV e' lateral:   6.74 cm/s LVOT diam:     2.00 cm   LV E/e' lateral: 13.1 LV SV:         107 LV SV Index:   71        2D Longitudinal Strain LVOT Area:     3.14 cm  2D Strain GLS Avg:     -12.8 %   RIGHT VENTRICLE             IVC RV S prime:     11.40 cm/s  IVC diam: 1.10 cm TAPSE (M-mode): 2.1 cm  LEFT ATRIUM             Index        RIGHT ATRIUM           Index LA diam:        3.90 cm 2.58 cm/m   RA Area:     12.80 cm LA Vol (A2C):   44.3 ml 29.27 ml/m  RA Volume:   24.90 ml  16.45 ml/m LA Vol (A4C):   56.5 ml 37.33 ml/m LA Biplane Vol: 52.4 ml 34.62 ml/m AORTIC VALVE AV Area (Vmax):    2.22 cm AV Area (Vmean):   2.32 cm AV Area (VTI):     2.27 cm AV Vmax:           213.50 cm/s AV Vmean:          150.000 cm/s AV VTI:  0.473 m AV Peak Grad:      18.2 mmHg AV Mean Grad:      10.0 mmHg LVOT Vmax:         151.00 cm/s LVOT Vmean:        111.000 cm/s LVOT VTI:          0.342 m LVOT/AV VTI ratio: 0.72 AI PHT:            445 msec  AORTA Ao Root diam: 3.00 cm Ao Asc diam:  2.90 cm Ao Desc diam: 1.90 cm  MITRAL VALVE               TRICUSPID VALVE MV Area (PHT): 4.44 cm    TR Peak grad:   35.3 mmHg MV Decel Time: 171 msec    TR Vmax:        297.00 cm/s MV E velocity: 88.30 cm/s MV A velocity: 79.70 cm/s  SHUNTS MV E/A ratio:  1.11         Systemic VTI:  0.34 m Systemic Diam: 2.00 cm  Norman Herrlich MD Electronically signed by Norman Herrlich MD Signature Date/Time: 01/15/2022/4:55:36 PM    Final    MONITORS  LONG TERM MONITOR (3-14 DAYS) 01/09/2022  Narrative Patch Wear Time:  10 days and 16 hours (2023-04-20T15:22:27-0400 to 2023-05-01T08:10:14-0400)  Patient had a min HR of 53 bpm, max HR of 210 bpm, and avg HR of 74 bpm. Predominant underlying rhythm was Sinus Rhythm. 1 run of Ventricular Tachycardia occurred lasting 5 beats with a max rate of 167 bpm (avg 147 bpm). 74 Supraventricular Tachycardia runs occurred, the run with the fastest interval lasting 6 beats with a max rate of 210 bpm, the longest lasting 12 beats with an avg rate of 130 bpm. Isolated SVEs were occasional (3.1%, 35076), SVE Couplets were rare (<1.0%, 1003), and SVE Triplets were rare (<1.0%, 1163). Isolated VEs were rare (<1.0%, 104), VE Couplets were rare (<1.0%, 3), and no VE Triplets were present.  SUMMARY: findings as outlined above - NSR with an average HR of 74 bpm. No afib or flutter. No bradycardic events. Rare PVC's, occasional PAC's. No sustained arrhythmias.           Assessment & Plan    1.  Coronary artery disease: -Patient treated with multiple PCI's wi PCI th most recent 06/2021 with PTCA/DES x 1 proximal LAD, -Today patient reports no residual chest pain or events that required as needed nitroglycerin since previous visit. -She is staying active and eating a heart healthy balanced diet. -Continue GDMT with ASA 81 mg, Plavix 75 mg, Toprol-XL 75 mg daily Zetia 10 mg, Lipitor 80 mg and as needed Nitrostat 0.4 mg  2.  Essential hypertension: -Patient's blood pressure today was 132/70 -Continue Norvasc 5 mg daily, losartan 25 mg daily  3.  Hyperlipidemia: -Patient's last LDL cholesterol was 39 -Continue Zetia and atorvastatin as noted above  4.  CKD stage III: -Patient's last creatinine was 0.62 -Followed by  nephrology.  Disposition: Follow-up with Tonny Bollman, MD or APP in 6 months    Medication Adjustments/Labs and Tests Ordered: Current medicines are reviewed at length with the patient today.  Concerns regarding medicines are outlined above.   Signed, Napoleon Form, Leodis Rains, NP 12/16/2022, 12:06 PM [facility]  Note:  This document was prepared using Dragon voice recognition software and may include unintentional dictation errors.

## 2022-12-17 ENCOUNTER — Ambulatory Visit: Payer: Medicare HMO | Attending: Nurse Practitioner | Admitting: Nurse Practitioner

## 2022-12-17 ENCOUNTER — Encounter: Payer: Self-pay | Admitting: Nurse Practitioner

## 2022-12-17 VITALS — BP 132/70 | HR 65 | Ht 59.0 in | Wt 124.0 lb

## 2022-12-17 DIAGNOSIS — I25119 Atherosclerotic heart disease of native coronary artery with unspecified angina pectoris: Secondary | ICD-10-CM | POA: Diagnosis not present

## 2022-12-17 DIAGNOSIS — N1832 Chronic kidney disease, stage 3b: Secondary | ICD-10-CM | POA: Diagnosis not present

## 2022-12-17 DIAGNOSIS — I10 Essential (primary) hypertension: Secondary | ICD-10-CM | POA: Diagnosis not present

## 2022-12-17 DIAGNOSIS — E782 Mixed hyperlipidemia: Secondary | ICD-10-CM

## 2022-12-17 NOTE — Patient Instructions (Signed)
Medication Instructions:  Your physician recommends that you continue on your current medications as directed. Please refer to the Current Medication list given to you today. *If you need a refill on your cardiac medications before your next appointment, please call your pharmacy*   Lab Work: None ordered If you have labs (blood work) drawn today and your tests are completely normal, you will receive your results only by: MyChart Message (if you have MyChart) OR A paper copy in the mail If you have any lab test that is abnormal or we need to change your treatment, we will call you to review the results.   Testing/Procedures: None Ordered   Follow-Up: At [facility], you and your health needs are our priority.  As part of our continuing mission to provide you with exceptional heart care, we have created designated Provider Care Teams.  These Care Teams include your primary Cardiologist (physician) and Advanced Practice Providers (APPs -  Physician Assistants and Nurse Practitioners) who all work together to provide you with the care you need, when you need it.  We recommend signing up for the patient portal called "MyChart".  Sign up information is provided on this After Visit Summary.  MyChart is used to connect with patients for Virtual Visits (Telemedicine).  Patients are able to view lab/test results, encounter notes, upcoming appointments, etc.  Non-urgent messages can be sent to your provider as well.   To learn more about what you can do with MyChart, go to [email or web address].    Your next appointment:   6 month(s)  Provider:   Tonny Bollman, MD     Other Instructions

## 2023-02-13 ENCOUNTER — Inpatient Hospital Stay (HOSPITAL_BASED_OUTPATIENT_CLINIC_OR_DEPARTMENT_OTHER)
Admission: EM | Admit: 2023-02-13 | Discharge: 2023-02-16 | DRG: 603 | Disposition: A | Discharge status: Home / Self Care | Payer: Medicare HMO | Attending: Family Medicine | Admitting: Family Medicine

## 2023-02-13 ENCOUNTER — Other Ambulatory Visit: Payer: Self-pay

## 2023-02-13 ENCOUNTER — Encounter (HOSPITAL_BASED_OUTPATIENT_CLINIC_OR_DEPARTMENT_OTHER): Payer: Self-pay | Admitting: Emergency Medicine

## 2023-02-13 DIAGNOSIS — Z800 Family history of malignant neoplasm of digestive organs: Secondary | ICD-10-CM | POA: Diagnosis not present

## 2023-02-13 DIAGNOSIS — Z8249 Family history of ischemic heart disease and other diseases of the circulatory system: Secondary | ICD-10-CM

## 2023-02-13 DIAGNOSIS — F419 Anxiety disorder, unspecified: Secondary | ICD-10-CM | POA: Diagnosis present

## 2023-02-13 DIAGNOSIS — I679 Cerebrovascular disease, unspecified: Secondary | ICD-10-CM | POA: Diagnosis present

## 2023-02-13 DIAGNOSIS — I110 Hypertensive heart disease with heart failure: Secondary | ICD-10-CM | POA: Diagnosis present

## 2023-02-13 DIAGNOSIS — Z87440 Personal history of urinary (tract) infections: Secondary | ICD-10-CM | POA: Diagnosis not present

## 2023-02-13 DIAGNOSIS — I2510 Atherosclerotic heart disease of native coronary artery without angina pectoris: Secondary | ICD-10-CM | POA: Diagnosis present

## 2023-02-13 DIAGNOSIS — E871 Hypo-osmolality and hyponatremia: Secondary | ICD-10-CM | POA: Diagnosis present

## 2023-02-13 DIAGNOSIS — L0291 Cutaneous abscess, unspecified: Secondary | ICD-10-CM | POA: Diagnosis present

## 2023-02-13 DIAGNOSIS — Z7982 Long term (current) use of aspirin: Secondary | ICD-10-CM | POA: Diagnosis not present

## 2023-02-13 DIAGNOSIS — L03115 Cellulitis of right lower limb: Secondary | ICD-10-CM | POA: Diagnosis present

## 2023-02-13 DIAGNOSIS — Z7902 Long term (current) use of antithrombotics/antiplatelets: Secondary | ICD-10-CM

## 2023-02-13 DIAGNOSIS — Z955 Presence of coronary angioplasty implant and graft: Secondary | ICD-10-CM

## 2023-02-13 DIAGNOSIS — Z8673 Personal history of transient ischemic attack (TIA), and cerebral infarction without residual deficits: Secondary | ICD-10-CM

## 2023-02-13 DIAGNOSIS — I10 Essential (primary) hypertension: Secondary | ICD-10-CM | POA: Diagnosis present

## 2023-02-13 DIAGNOSIS — Z96652 Presence of left artificial knee joint: Secondary | ICD-10-CM | POA: Diagnosis present

## 2023-02-13 DIAGNOSIS — K219 Gastro-esophageal reflux disease without esophagitis: Secondary | ICD-10-CM | POA: Diagnosis present

## 2023-02-13 DIAGNOSIS — Z801 Family history of malignant neoplasm of trachea, bronchus and lung: Secondary | ICD-10-CM

## 2023-02-13 DIAGNOSIS — E782 Mixed hyperlipidemia: Secondary | ICD-10-CM | POA: Diagnosis present

## 2023-02-13 DIAGNOSIS — M1990 Unspecified osteoarthritis, unspecified site: Secondary | ICD-10-CM | POA: Diagnosis present

## 2023-02-13 DIAGNOSIS — I341 Nonrheumatic mitral (valve) prolapse: Secondary | ICD-10-CM | POA: Diagnosis present

## 2023-02-13 DIAGNOSIS — Z881 Allergy status to other antibiotic agents status: Secondary | ICD-10-CM

## 2023-02-13 DIAGNOSIS — Z888 Allergy status to other drugs, medicaments and biological substances status: Secondary | ICD-10-CM

## 2023-02-13 DIAGNOSIS — L02415 Cutaneous abscess of right lower limb: Principal | ICD-10-CM | POA: Diagnosis present

## 2023-02-13 DIAGNOSIS — Z83719 Family history of colon polyps, unspecified: Secondary | ICD-10-CM

## 2023-02-13 DIAGNOSIS — Z9049 Acquired absence of other specified parts of digestive tract: Secondary | ICD-10-CM

## 2023-02-13 DIAGNOSIS — I5032 Chronic diastolic (congestive) heart failure: Secondary | ICD-10-CM | POA: Diagnosis present

## 2023-02-13 DIAGNOSIS — E119 Type 2 diabetes mellitus without complications: Secondary | ICD-10-CM | POA: Diagnosis present

## 2023-02-13 DIAGNOSIS — Z66 Do not resuscitate: Secondary | ICD-10-CM | POA: Diagnosis present

## 2023-02-13 DIAGNOSIS — I252 Old myocardial infarction: Secondary | ICD-10-CM

## 2023-02-13 DIAGNOSIS — R21 Rash and other nonspecific skin eruption: Secondary | ICD-10-CM | POA: Diagnosis present

## 2023-02-13 DIAGNOSIS — G894 Chronic pain syndrome: Secondary | ICD-10-CM | POA: Diagnosis present

## 2023-02-13 DIAGNOSIS — Z90710 Acquired absence of both cervix and uterus: Secondary | ICD-10-CM

## 2023-02-13 DIAGNOSIS — Z86010 Personal history of colonic polyps: Secondary | ICD-10-CM

## 2023-02-13 DIAGNOSIS — Z802 Family history of malignant neoplasm of other respiratory and intrathoracic organs: Secondary | ICD-10-CM

## 2023-02-13 DIAGNOSIS — E785 Hyperlipidemia, unspecified: Secondary | ICD-10-CM | POA: Diagnosis present

## 2023-02-13 DIAGNOSIS — Z981 Arthrodesis status: Secondary | ICD-10-CM

## 2023-02-13 DIAGNOSIS — Z79899 Other long term (current) drug therapy: Secondary | ICD-10-CM

## 2023-02-13 DIAGNOSIS — Z882 Allergy status to sulfonamides status: Secondary | ICD-10-CM

## 2023-02-13 DIAGNOSIS — K5790 Diverticulosis of intestine, part unspecified, without perforation or abscess without bleeding: Secondary | ICD-10-CM | POA: Diagnosis present

## 2023-02-13 LAB — COMPREHENSIVE METABOLIC PANEL
ALT: 21 U/L (ref 0–44)
AST: 22 U/L (ref 15–41)
Albumin: 3.6 g/dL (ref 3.5–5.0)
Alkaline Phosphatase: 60 U/L (ref 38–126)
Anion gap: 9 (ref 5–15)
BUN: 15 mg/dL (ref 8–23)
CO2: 28 mmol/L (ref 22–32)
Calcium: 8.5 mg/dL — ABNORMAL LOW (ref 8.9–10.3)
Chloride: 91 mmol/L — ABNORMAL LOW (ref 98–111)
Creatinine, Ser: 0.81 mg/dL (ref 0.44–1.00)
GFR, Estimated: >60 mL/min (ref >60)
Glucose, Bld: 139 mg/dL — ABNORMAL HIGH (ref 70–99)
Potassium: 4.1 mmol/L (ref 3.5–5.1)
Sodium: 128 mmol/L — ABNORMAL LOW (ref 135–145)
Total Bilirubin: 0.4 mg/dL (ref 0.3–1.2)
Total Protein: 6.7 g/dL (ref 6.5–8.1)

## 2023-02-13 LAB — URINALYSIS, W/ REFLEX TO CULTURE (INFECTION SUSPECTED)
Bilirubin Urine: NEGATIVE
Glucose, UA: NEGATIVE mg/dL
Hgb urine dipstick: NEGATIVE
Ketones, ur: NEGATIVE mg/dL
Leukocytes,Ua: NEGATIVE
Nitrite: NEGATIVE
Protein, ur: NEGATIVE mg/dL
Specific Gravity, Urine: 1.012 (ref 1.005–1.030)
pH: 6 (ref 5.0–8.0)

## 2023-02-13 LAB — CBC WITH DIFFERENTIAL/PLATELET
Abs Immature Granulocytes: 0.07 10*3/uL (ref 0.00–0.07)
Basophils Absolute: 0.1 10*3/uL (ref 0.0–0.1)
Basophils Relative: 1 %
Eosinophils Absolute: 0.3 10*3/uL (ref 0.0–0.5)
Eosinophils Relative: 2 %
HCT: 37.8 % (ref 36.0–46.0)
Hemoglobin: 12.5 g/dL (ref 12.0–15.0)
Immature Granulocytes: 1 %
Lymphocytes Relative: 17 %
Lymphs Abs: 2.1 10*3/uL (ref 0.7–4.0)
MCH: 30.7 pg (ref 26.0–34.0)
MCHC: 33.1 g/dL (ref 30.0–36.0)
MCV: 92.9 fL (ref 80.0–100.0)
Monocytes Absolute: 1.7 10*3/uL — ABNORMAL HIGH (ref 0.1–1.0)
Monocytes Relative: 13 %
Neutro Abs: 8.6 10*3/uL — ABNORMAL HIGH (ref 1.7–7.7)
Neutrophils Relative %: 66 %
Platelets: 296 10*3/uL (ref 150–400)
RBC: 4.07 MIL/uL (ref 3.87–5.11)
RDW: 12.8 % (ref 11.5–15.5)
WBC: 12.8 10*3/uL — ABNORMAL HIGH (ref 4.0–10.5)
nRBC: 0 % (ref 0.0–0.2)

## 2023-02-13 LAB — APTT: aPTT: 31 seconds (ref 24–36)

## 2023-02-13 LAB — LACTIC ACID, PLASMA: Lactic Acid, Venous: 1.1 mmol/L (ref 0.5–1.9)

## 2023-02-13 LAB — PROTIME-INR
INR: 1.1 (ref 0.8–1.2)
Prothrombin Time: 14.1 seconds (ref 11.4–15.2)

## 2023-02-13 MED ORDER — VANCOMYCIN HCL IN DEXTROSE 1-5 GM/200ML-% IV SOLN
1000.0000 mg | Freq: Once | INTRAVENOUS | Status: AC
  Administered 2023-02-13: 1000 mg via INTRAVENOUS
  Filled 2023-02-13: qty 200

## 2023-02-13 MED ORDER — SODIUM CHLORIDE 0.9 % IV BOLUS (SEPSIS)
500.0000 mL | Freq: Once | INTRAVENOUS | Status: AC
  Administered 2023-02-13: 500 mL via INTRAVENOUS

## 2023-02-13 MED ORDER — LIDOCAINE-EPINEPHRINE (PF) 2 %-1:200000 IJ SOLN
10.0000 mL | Freq: Once | INTRAMUSCULAR | Status: AC
  Administered 2023-02-13: 10 mL
  Filled 2023-02-13: qty 20

## 2023-02-13 NOTE — ED Provider Notes (Signed)
Emergency Department Provider Note   I have reviewed the triage vital signs and the nursing notes.   HISTORY  Chief Complaint Abscess   HPI [name] [name] is a 82 y.o. female with PMH of CAD presents to the emergency department for evaluation of rash and drainage from the right inner thigh.  Patient has had symptoms developing over the past 3 to 4 days.  She was started on clindamycin with first dose taken yesterday and presents today because she is developed some drainage and worsening swelling/redness along with pain.  She has had fever at home as high as 101 F. No AMS per family at bedside. No vomiting.    Past Medical History:  Diagnosis Date   Allergy    Anal fissure    Anxiety    CAD (coronary artery disease)    s/p inf MI 2007 - tx w/ DES to RCA // s/p POBA to [facility] and DES to dRCA in 11/18 ([facility] in [place], [place]) // Myoview 3/21: low risk // s/p DES to pRCA   Cataract    removed both eyes   Cerebrovascular disease    Carotid US 09/2017 [facility]): mild plaque (<50%) in both carotid arteries   Chronic neck pain    Chronic pain syndrome    Diabetes Mellitus, Type 2    Diverticular disease    DJD (degenerative joint disease)    Echocardiogram 10/2018    Echo 10/2018: EF 55-60, normal RVSF, mod MAC, mod TR, severe AoV calcification and sclerosis with nodular calcium/mobile area of calcium in the LVOT (small veg vs Lambl's excrescence  - consider TEE), mild AI, mild AS (mean 11).    Echocardiogram 04/2019    Echocardiogram 04/2019: EF 60-65, basal septal hypertrophy, grade 2 diastolic dysfunction, normal wall motion, normal RV SF, mild LAE, mod MAC, trivial MR, mod sclerosis of the aortic valve with mod aortic annular calcification, thin mobile filamentous structure on ventricular side of AV likely representing Lambl's excrescence, mild AI, mild TR   Frequent UTI's    Gastroesophageal reflux disease    H/O bacterial endocarditis    Rx w IV Abxs in 2020 ([facility] in  [place], [place]) // F/u echo with AoV Lambl's excrescence   H/O hiatal hernia    HTN (hypertension)    Hx of fall 10/2020   Hx of MI 2007   Hx of Stroke    IBS (irritable bowel syndrome)    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 (Cervical Fusion)   Premature ventricular contractions    Tubular adenoma of colon    Ulcer    sam [place] [facility]   Urinary incontinence     Review of Systems  Constitutional: Positive fever/chills Cardiovascular: Denies chest pain. Respiratory: Denies shortness of breath. Gastrointestinal: No abdominal pain.   Musculoskeletal: Negative for back pain. Skin: Positive rash/abscess.  Neurological: Negative for headaches, focal weakness or numbness.  ____________________________________________   PHYSICAL EXAM:  VITAL SIGNS: ED Triage Vitals  Enc Vitals Group     BP 02/13/23 1803 (!) 159/104     Pulse Rate 02/13/23 1803 83     Resp 02/13/23 1803  18     Temp 02/13/23 1803 97.9 F (36.6 C)     Temp Source 02/13/23 1803 Oral     SpO2 02/13/23 1805 97 %   Constitutional: Alert and oriented. Well appearing and in no acute distress. Eyes: Conjunctivae are normal.  Head: Atraumatic. Nose: No congestion/rhinnorhea. Mouth/Throat: Mucous membranes are moist. Neck: No stridor.   Cardiovascular: Normal rate, regular rhythm. Good peripheral circulation. Grossly normal heart sounds.   Respiratory: Normal respiratory effort.  No retractions. Lungs CTAB. Gastrointestinal: Soft and nontender. No distention.  Musculoskeletal: No lower extremity tenderness nor edema. No gross deformities of extremities. Neurologic:  Normal speech and language. No gross focal neurologic  deficits are appreciated.  Skin:  Skin is warm and dry.  Large indurated, erythematous, warm area to the proximal, inner right thigh as pictured below.  There is area of point tenderness with some drainage. Minimal fluctuance.      ____________________________________________   LABS (all labs ordered are listed, but only abnormal results are displayed)  Labs Reviewed  COMPREHENSIVE METABOLIC PANEL - Abnormal; Notable for the following components:      Result Value   Sodium 128 (*)    Chloride 91 (*)    Glucose, Bld 139 (*)    Calcium 8.5 (*)    All other components within normal limits  CBC WITH DIFFERENTIAL/PLATELET - Abnormal; Notable for the following components:   WBC 12.8 (*)    Neutro Abs 8.6 (*)    Monocytes Absolute 1.7 (*)    All other components within normal limits  URINALYSIS, W/ REFLEX TO CULTURE (INFECTION SUSPECTED) - Abnormal; Notable for the following components:   Bacteria, UA RARE (*)    All other components within normal limits  CBC WITH DIFFERENTIAL/PLATELET - Abnormal; Notable for the following components:   Monocytes Absolute 1.2 (*)    All other components within normal limits  COMPREHENSIVE METABOLIC PANEL - Abnormal; Notable for the following components:   Sodium 133 (*)    Chloride 94 (*)    Glucose, Bld 144 (*)    BUN 6 (*)    Calcium 8.5 (*)    Total Protein 6.3 (*)    Albumin 2.7 (*)    All other components within normal limits  HEMOGLOBIN A1C - Abnormal; Notable for the following components:   Hgb A1c MFr Bld 7.1 (*)    All other components within normal limits  GLUCOSE, CAPILLARY - Abnormal; Notable for the following components:   Glucose-Capillary 141 (*)    All other components within normal limits  GLUCOSE, CAPILLARY - Abnormal; Notable for the following components:   Glucose-Capillary 157 (*)    All other components within normal limits  GLUCOSE, CAPILLARY - Abnormal; Notable for the following components:   Glucose-Capillary 160 (*)     All other components within normal limits  GLUCOSE, CAPILLARY - Abnormal; Notable for the following components:   Glucose-Capillary 111 (*)    All other components within normal limits  COMPREHENSIVE METABOLIC PANEL - Abnormal; Notable for the following components:   Chloride 96 (*)    Glucose, Bld 125 (*)    BUN 7 (*)    Calcium 8.7 (*)    Total Protein 6.1 (*)    Albumin 2.7 (*)    All other components within normal limits  PHOSPHORUS - Abnormal; Notable for the following components:   Phosphorus 4.7 (*)    All other components within normal limits  GLUCOSE, CAPILLARY - Abnormal; Notable for the following components:  Glucose-Capillary 159 (*)    All other components within normal limits  GLUCOSE, CAPILLARY - Abnormal; Notable for the following components:   Glucose-Capillary 153 (*)    All other components within normal limits  GLUCOSE, CAPILLARY - Abnormal; Notable for the following components:   Glucose-Capillary 161 (*)    All other components within normal limits  GLUCOSE, CAPILLARY - Abnormal; Notable for the following components:   Glucose-Capillary 101 (*)    All other components within normal limits  COMPREHENSIVE METABOLIC PANEL - Abnormal; Notable for the following components:   Sodium 133 (*)    Chloride 94 (*)    Glucose, Bld 149 (*)    Calcium 8.7 (*)    Total Protein 6.4 (*)    Albumin 3.0 (*)    All other components within normal limits  GLUCOSE, CAPILLARY - Abnormal; Notable for the following components:   Glucose-Capillary 138 (*)    All other components within normal limits  CULTURE, BLOOD (ROUTINE X 2)  CULTURE, BLOOD (ROUTINE X 2)  MRSA NEXT GEN BY PCR, NASAL  LACTIC ACID, PLASMA  PROTIME-INR  APTT  MAGNESIUM  CBC WITH DIFFERENTIAL/PLATELET  MAGNESIUM  CBC WITH DIFFERENTIAL/PLATELET  MAGNESIUM  PHOSPHORUS    ____________________________________________   PROCEDURES  Procedure(s) performed:   Marland KitchenMarland KitchenIncision and Drainage  Date/Time:  02/13/2023 7:18 PM  Performed by: Maia Plan, MD Authorized by: Maia Plan, MD   Consent:    Consent obtained:  Verbal   Consent given by:  Patient   Risks, benefits, and alternatives were discussed: yes     Risks discussed:  Bleeding, damage to other organs, incomplete drainage, infection and pain Universal protocol:    Immediately prior to procedure, a time out was called: yes     Patient identity confirmed:  Verbally with patient Location:    Type:  Abscess   Size:  4 cm   Location:  Lower extremity   Lower extremity location:  Leg   Leg location:  R upper leg Pre-procedure details:    Skin preparation:  Povidone-iodine Sedation:    Sedation type:  None Anesthesia:    Anesthesia method:  Local infiltration   Local anesthetic:  Lidocaine 2% WITH epi Procedure type:    Complexity:  Complex Procedure details:    Ultrasound guidance: yes     Needle aspiration: no     Incision types:  Single straight   Incision depth:  Subcutaneous   Wound management:  Probed and deloculated   Drainage:  Bloody and purulent   Drainage amount:  Moderate   Wound treatment:  Wound left open   Packing materials:  1/4 in iodoform gauze   Amount 1/4" iodoform:  15 cm Post-procedure details:    Procedure completion:  Tolerated well, no immediate complications Comments:     Minimal purulence after incision but after probing I was able to express some additional pus.  I was able to gain access to a fairly large pocket just under the skin and this was packed with quarter inch iodoform gauze.  Dressing left in place. Patient tolerated procedure well.    EMERGENCY DEPARTMENT US SOFT TISSUE INTERPRETATION "Study: Limited Soft Tissue Ultrasound"  INDICATIONS: Pain and Soft tissue infection Multiple views of the body part were obtained in real-time with a multi-frequency linear probe PERFORMED BY:  Myself SIDE:Right  BODY PART:Lower extremity FINDINGS: Cellulitis present and Other some  shallow fluid collection near skin surface. No apparent large volume fluid collection. INTERPRETATION:  Cellulitis present  CPT:  Lower extremity [phone]   ____________________________________________   INITIAL IMPRESSION / ASSESSMENT AND PLAN / ED COURSE  Pertinent labs & imaging results that were available during my care of the patient were reviewed by me and considered in my medical decision making (see chart for details).   This patient is Presenting for Evaluation of cellulitis, which does require a range of treatment options, and is a complaint that involves a high risk of morbidity and mortality.  The Differential Diagnoses include cellulitis, abscess, sepsis, fascitis, etc.  Critical Interventions-    Medications  acetaminophen (TYLENOL) tablet 650 mg (650 mg Oral Given 02/15/23 2233)    Or  acetaminophen (TYLENOL) suppository 650 mg ( Rectal See Alternative 02/15/23 2233)  melatonin tablet 3 mg (has no administration in time range)  ondansetron (ZOFRAN) injection 4 mg (has no administration in time range)  naloxone [facility]) injection 0.4 mg (has no administration in time range)  fentaNYL (SUBLIMAZE) injection 25 mcg (has no administration in time range)  aspirin EC tablet 81 mg (81 mg Oral Given 02/15/23 0903)  atorvastatin (LIPITOR) tablet 80 mg (80 mg Oral Given 02/15/23 0903)  clopidogrel (PLAVIX) tablet 75 mg (75 mg Oral Given 02/15/23 0902)  dicyclomine (BENTYL) capsule 10 mg (10 mg Oral Given 02/15/23 2233)  ezetimibe (ZETIA) tablet 10 mg (10 mg Oral Given 02/15/23 0902)  famotidine (PEPCID) tablet 40 mg (40 mg Oral Given 02/15/23 2233)  losartan (COZAAR) tablet 25 mg (25 mg Oral Given 02/15/23 0903)  metoprolol succinate (TOPROL-XL) 24 hr tablet 75 mg (75 mg Oral Given 02/15/23 0902)  pantoprazole (PROTONIX) EC tablet 40 mg (40 mg Oral Given 02/15/23 2233)  insulin aspart (novoLOG) injection 0-9 Units ( Subcutaneous Not Given 02/15/23 1606)  linezolid (ZYVOX) tablet 600  mg (600 mg Oral Given 02/15/23 1707)  lidocaine-EPINEPHrine (XYLOCAINE W/EPI) 2 %-1:200000 (PF) injection 10 mL (10 mLs Infiltration Given by Other 02/13/23 1855)  sodium chloride 0.9 % bolus 500 mL (0 mLs Intravenous Stopped 02/13/23 2018)  vancomycin (VANCOCIN) IVPB 1000 mg/200 mL premix (0 mg Intravenous Stopped 02/13/23 2052)  iohexol (OMNIPAQUE) 350 MG/ML injection 75 mL (75 mLs Intravenous Contrast Given 02/14/23 1100)    Reassessment after intervention:  symptoms improved.    I did obtain Additional Historical Information from family at bedside.    Clinical Laboratory Tests Ordered, included patient with chronic hyponatremia, near baseline today. CBC with leukocytosis. No AKI.   Cardiac Monitor Tracing which shows NSR.    Social Determinants of Health Risk patient is a non-smoker.   Consult complete with [facility]. Plan for admit.   Medical Decision Making: Summary:  Patient presents to the emergency department for evaluation of worsening rash to the inner right thigh.  Exam is mostly consistent with cellulitis.  She has a large, indurated, erythematous area to the proximal thigh.  There is some purulent drainage on her clothing but fairly minimal fluid on bedside ultrasound.  I do think that given the size of her cellulitic area will attempt to open this area further to help obtain source control.   Reevaluation with update and discussion with patient and daughter. Plan for IV abx and admit. I&D performed and tolerated well.   Patient's presentation is most consistent with acute presentation with potential threat to life or bodily function.   Disposition: admit  ____________________________________________  FINAL CLINICAL IMPRESSION(S) / ED DIAGNOSES  Final diagnoses:  Cellulitis of right lower extremity  Abscess     NEW OUTPATIENT MEDICATIONS STARTED DURING THIS VISIT:  Current Discharge Medication List     START taking these medications   Details  linezolid (ZYVOX) 600  MG tablet Take 1 tablet (600 mg total) by mouth 2 (two) times daily for 7 days. Qty: 14 tablet, Refills: 0   Comments: Hold for discharge 6/15, thanks        Note:  This document was prepared using Dragon voice recognition software and may include unintentional dictation errors.  Alona Bene, MD, [facility]    Clarie Camey, Arlyss Repress, MD 02/16/23 [phone]

## 2023-02-13 NOTE — ED Notes (Signed)
Attempted to call report, RN unable to take report at this time.

## 2023-02-13 NOTE — ED Triage Notes (Signed)
Pt here from home with a abscess to the the right inner thigh was placed on antibiotics on Monday and started draining today ,

## 2023-02-14 ENCOUNTER — Inpatient Hospital Stay (HOSPITAL_COMMUNITY): Payer: Medicare HMO

## 2023-02-14 ENCOUNTER — Encounter (HOSPITAL_COMMUNITY): Payer: Self-pay | Admitting: Internal Medicine

## 2023-02-14 DIAGNOSIS — L03115 Cellulitis of right lower limb: Secondary | ICD-10-CM

## 2023-02-14 DIAGNOSIS — L02415 Cutaneous abscess of right lower limb: Principal | ICD-10-CM

## 2023-02-14 DIAGNOSIS — E119 Type 2 diabetes mellitus without complications: Secondary | ICD-10-CM

## 2023-02-14 DIAGNOSIS — E782 Mixed hyperlipidemia: Secondary | ICD-10-CM

## 2023-02-14 DIAGNOSIS — E871 Hypo-osmolality and hyponatremia: Secondary | ICD-10-CM

## 2023-02-14 DIAGNOSIS — I5032 Chronic diastolic (congestive) heart failure: Secondary | ICD-10-CM | POA: Diagnosis not present

## 2023-02-14 DIAGNOSIS — I10 Essential (primary) hypertension: Secondary | ICD-10-CM

## 2023-02-14 DIAGNOSIS — K219 Gastro-esophageal reflux disease without esophagitis: Secondary | ICD-10-CM

## 2023-02-14 LAB — CBC WITH DIFFERENTIAL/PLATELET
Abs Immature Granulocytes: 0.06 10*3/uL (ref 0.00–0.07)
Basophils Absolute: 0.1 10*3/uL (ref 0.0–0.1)
Basophils Relative: 1 %
Eosinophils Absolute: 0.3 10*3/uL (ref 0.0–0.5)
Eosinophils Relative: 3 %
HCT: 36.3 % (ref 36.0–46.0)
Hemoglobin: 12 g/dL (ref 12.0–15.0)
Immature Granulocytes: 1 %
Lymphocytes Relative: 19 %
Lymphs Abs: 1.9 10*3/uL (ref 0.7–4.0)
MCH: 30.7 pg (ref 26.0–34.0)
MCHC: 33.1 g/dL (ref 30.0–36.0)
MCV: 92.8 fL (ref 80.0–100.0)
Monocytes Absolute: 1.2 10*3/uL — ABNORMAL HIGH (ref 0.1–1.0)
Monocytes Relative: 12 %
Neutro Abs: 6.3 10*3/uL (ref 1.7–7.7)
Neutrophils Relative %: 64 %
Platelets: 294 10*3/uL (ref 150–400)
RBC: 3.91 MIL/uL (ref 3.87–5.11)
RDW: 12.6 % (ref 11.5–15.5)
WBC: 9.8 10*3/uL (ref 4.0–10.5)
nRBC: 0 % (ref 0.0–0.2)

## 2023-02-14 LAB — HEMOGLOBIN A1C
Hgb A1c MFr Bld: 7.1 % — ABNORMAL HIGH (ref 4.8–5.6)
Mean Plasma Glucose: 157.07 mg/dL

## 2023-02-14 LAB — MAGNESIUM: Magnesium: 1.8 mg/dL (ref 1.7–2.4)

## 2023-02-14 LAB — MRSA NEXT GEN BY PCR, NASAL: MRSA by PCR Next Gen: NOT DETECTED

## 2023-02-14 LAB — COMPREHENSIVE METABOLIC PANEL
ALT: 23 U/L (ref 0–44)
AST: 19 U/L (ref 15–41)
Albumin: 2.7 g/dL — ABNORMAL LOW (ref 3.5–5.0)
Alkaline Phosphatase: 53 U/L (ref 38–126)
Anion gap: 10 (ref 5–15)
BUN: 6 mg/dL — ABNORMAL LOW (ref 8–23)
CO2: 29 mmol/L (ref 22–32)
Calcium: 8.5 mg/dL — ABNORMAL LOW (ref 8.9–10.3)
Chloride: 94 mmol/L — ABNORMAL LOW (ref 98–111)
Creatinine, Ser: 0.68 mg/dL (ref 0.44–1.00)
GFR, Estimated: >60 mL/min (ref >60)
Glucose, Bld: 144 mg/dL — ABNORMAL HIGH (ref 70–99)
Potassium: 3.9 mmol/L (ref 3.5–5.1)
Sodium: 133 mmol/L — ABNORMAL LOW (ref 135–145)
Total Bilirubin: 0.4 mg/dL (ref 0.3–1.2)
Total Protein: 6.3 g/dL — ABNORMAL LOW (ref 6.5–8.1)

## 2023-02-14 LAB — GLUCOSE, CAPILLARY
Glucose-Capillary: 141 mg/dL — ABNORMAL HIGH (ref 70–99)
Glucose-Capillary: 157 mg/dL — ABNORMAL HIGH (ref 70–99)
Glucose-Capillary: 160 mg/dL — ABNORMAL HIGH (ref 70–99)
Glucose-Capillary: 111 mg/dL — ABNORMAL HIGH (ref 70–99)
Glucose-Capillary: 159 mg/dL — ABNORMAL HIGH (ref 70–99)

## 2023-02-14 MED ORDER — ONDANSETRON HCL 4 MG/2ML IJ SOLN: 4.0000 mg | Freq: Four times a day (QID) | INTRAMUSCULAR | Status: DC | PRN

## 2023-02-14 MED ORDER — INSULIN ASPART 100 UNIT/ML IJ SOLN
0.0000 [IU] | Freq: Three times a day (TID) | INTRAMUSCULAR | Status: DC
  Administered 2023-02-14 – 2023-02-15 (×4): 2 [IU] via SUBCUTANEOUS
  Administered 2023-02-16: 1 [IU] via SUBCUTANEOUS

## 2023-02-14 MED ORDER — CLOPIDOGREL BISULFATE 75 MG PO TABS
75.0000 mg | ORAL_TABLET | Freq: Every day | ORAL | Status: DC
  Administered 2023-02-14 – 2023-02-16 (×3): 75 mg via ORAL
  Filled 2023-02-14 (×3): qty 1

## 2023-02-14 MED ORDER — LOSARTAN POTASSIUM 50 MG PO TABS
25.0000 mg | ORAL_TABLET | Freq: Every day | ORAL | Status: DC
  Administered 2023-02-14 – 2023-02-16 (×3): 25 mg via ORAL
  Filled 2023-02-14 (×3): qty 1

## 2023-02-14 MED ORDER — ATORVASTATIN CALCIUM 80 MG PO TABS
80.0000 mg | ORAL_TABLET | Freq: Every day | ORAL | Status: DC
  Administered 2023-02-14 – 2023-02-16 (×3): 80 mg via ORAL
  Filled 2023-02-14 (×3): qty 1

## 2023-02-14 MED ORDER — ACETAMINOPHEN 650 MG RE SUPP: 650.0000 mg | Freq: Four times a day (QID) | RECTAL | Status: DC | PRN

## 2023-02-14 MED ORDER — MELATONIN 3 MG PO TABS: 3.0000 mg | ORAL_TABLET | Freq: Every evening | ORAL | Status: DC | PRN

## 2023-02-14 MED ORDER — IOHEXOL 350 MG/ML SOLN
75.0000 mL | Freq: Once | INTRAVENOUS | Status: AC | PRN
  Administered 2023-02-14: 75 mL via INTRAVENOUS

## 2023-02-14 MED ORDER — ACETAMINOPHEN 325 MG PO TABS
650.0000 mg | ORAL_TABLET | Freq: Four times a day (QID) | ORAL | Status: DC | PRN
  Administered 2023-02-14 – 2023-02-15 (×2): 650 mg via ORAL
  Filled 2023-02-14 (×2): qty 2

## 2023-02-14 MED ORDER — VANCOMYCIN HCL 750 MG/150ML IV SOLN
750.0000 mg | INTRAVENOUS | Status: DC
  Administered 2023-02-14: 750 mg via INTRAVENOUS
  Filled 2023-02-14: qty 150

## 2023-02-14 MED ORDER — EZETIMIBE 10 MG PO TABS
10.0000 mg | ORAL_TABLET | Freq: Every morning | ORAL | Status: DC
  Administered 2023-02-14 – 2023-02-16 (×3): 10 mg via ORAL
  Filled 2023-02-14 (×3): qty 1

## 2023-02-14 MED ORDER — DICYCLOMINE HCL 10 MG PO CAPS
10.0000 mg | ORAL_CAPSULE | Freq: Three times a day (TID) | ORAL | Status: DC
  Administered 2023-02-14 – 2023-02-16 (×7): 10 mg via ORAL
  Filled 2023-02-14 (×7): qty 1

## 2023-02-14 MED ORDER — METOPROLOL SUCCINATE ER 50 MG PO TB24
75.0000 mg | ORAL_TABLET | Freq: Every morning | ORAL | Status: DC
  Administered 2023-02-14 – 2023-02-16 (×3): 75 mg via ORAL
  Filled 2023-02-14 (×3): qty 1

## 2023-02-14 MED ORDER — FAMOTIDINE 20 MG PO TABS
40.0000 mg | ORAL_TABLET | Freq: Two times a day (BID) | ORAL | Status: DC
  Administered 2023-02-14 – 2023-02-16 (×5): 40 mg via ORAL
  Filled 2023-02-14 (×5): qty 2

## 2023-02-14 MED ORDER — PANTOPRAZOLE SODIUM 40 MG PO TBEC
40.0000 mg | DELAYED_RELEASE_TABLET | Freq: Two times a day (BID) | ORAL | Status: DC
  Administered 2023-02-14 – 2023-02-16 (×5): 40 mg via ORAL
  Filled 2023-02-14 (×5): qty 1

## 2023-02-14 MED ORDER — FENTANYL CITRATE PF 50 MCG/ML IJ SOSY: 25.0000 ug | PREFILLED_SYRINGE | INTRAMUSCULAR | Status: DC | PRN

## 2023-02-14 MED ORDER — NALOXONE HCL 0.4 MG/ML IJ SOLN: 0.4000 mg | INTRAMUSCULAR | Status: DC | PRN

## 2023-02-14 MED ORDER — ASPIRIN 81 MG PO TBEC
81.0000 mg | DELAYED_RELEASE_TABLET | Freq: Every morning | ORAL | Status: DC
  Administered 2023-02-14 – 2023-02-16 (×3): 81 mg via ORAL
  Filled 2023-02-14 (×3): qty 1

## 2023-02-14 NOTE — Progress Notes (Signed)
PROGRESS NOTE    [name] [name]  ZDG:[id number] DOB: 03-01-1941 DOA: 02/13/2023 PCP: Raymon Mutton., FNP  Chief Complaint  Patient presents with   Abscess    Brief Narrative:   [name] [name] is [name] [name] 82 y.o. female with medical history significant for type 2 diabetes mellitus, essential pretension, hyperlipidemia, chronic diastolic heart failure, chronic hyponatremia, who is admitted to [facility] on 02/13/2023 by way of transfer from [facility] with cellulitis of the right lower extremity after presenting from home to [facility] ED complaining of right lower extremity erythema.   Currently being treated for skin and soft tissue infection.  Assessment & Plan:   Principal Problem:   Cellulitis and abscess of right lower extremity Active Problems:   DM2 (diabetes mellitus, type 2) (HCC)   Hyperlipidemia   Essential hypertension   GERD (gastroesophageal reflux disease)   Chronic hyponatremia   Chronic diastolic CHF (congestive heart failure) (HCC)  Skin and Soft Tissue Infection Cellulitis  Abscess S/p I&D in ED, still large residual mass within cellulitic area, concern that she may have residual abscess requiring additional I&D Surgery consulted, CT R leg pending Continue vancomycin to cover MRSA with purulent drainage Blood cultures pending  Leukocytosis Resolved  Hyponatremia Mild, improved  CAD  Last cath was 06/2021 with PTCA/DES x1 proximal LAD On DAPT  T2DM A1c 7.1 SSI  GERD PPI, H2 blocker (home meds)  HTN metoprolol, losartan Amlodipine currently on hold  Dyslipidemia Statin, zetia  HFpEF Appears euvolemic     DVT prophylaxis: SCD Code Status: DNR Family Communication: none Disposition:   Status is: Inpatient Remains inpatient appropriate because: need for continued inpatient care, possible I&D   Consultants:  General surgery  Procedures:  I&D in ED  Antimicrobials:  Anti-infectives (From admission, onward)    Start      Dose/Rate Route Frequency Ordered Stop   02/14/23 2000  vancomycin (VANCOREADY) IVPB 750 mg/150 mL        750 mg 150 mL/hr over 60 Minutes Intravenous Every 24 hours 02/14/23 0246     02/13/23 1930  vancomycin (VANCOCIN) IVPB 1000 mg/200 mL premix        1,000 mg 200 mL/hr over 60 Minutes Intravenous  Once 02/13/23 1918 02/13/23 2052       Subjective: No complaints, feels better than yesterday  Objective: Vitals:   02/13/23 2115 02/13/23 2255 02/14/23 0457 02/14/23 0735  BP:  (!) 156/69 (!) 152/66 (!) 150/84  Pulse: 74 80 80 81  Resp:  15 15   Temp:  99.4 F (37.4 C) 98.3 F (36.8 C) 98.3 F (36.8 C)  TempSrc:  Oral    SpO2: 93% 97% 95% 94%  Weight:  56.2 kg    Height:  4\' 11"  (1.499 m)      Intake/Output Summary (Last 24 hours) at 02/14/2023 0852 Last data filed at 02/13/2023 2052 Gross per 24 hour  Intake 700 ml  Output --  Net 700 ml   Filed Weights   02/13/23 2255  Weight: 56.2 kg    Examination:  General exam: Appears calm and comfortable  Respiratory system: unlabored Cardiovascular system: RRR Central nervous system: Alert and oriented. No focal neurological deficits. Skin: R thigh with diffuse erythema, large palpable mass, not clearly fluctuant, but discrete mass in R thigh (at least 5 cm in diameter)     Data Reviewed: I have personally reviewed following labs and imaging studies  CBC: Recent Labs  Lab 02/13/23 1848 02/14/23 0500  WBC  12.8* 9.8  NEUTROABS 8.6* 6.3  HGB 12.5 12.0  HCT 37.8 36.3  MCV 92.9 92.8  PLT 296 294    Basic Metabolic Panel: Recent Labs  Lab 02/13/23 1848 02/14/23 0500  NA 128* 133*  K 4.1 3.9  CL 91* 94*  CO2 28 29  GLUCOSE 139* 144*  BUN 15 6*  CREATININE 0.81 0.68  CALCIUM 8.5* 8.5*  MG  --  1.8    GFR: Estimated Creatinine Clearance: 40.7 mL/min (by C-G formula based on SCr of 0.68 mg/dL).  Liver Function Tests: Recent Labs  Lab 02/13/23 1848 02/14/23 0500  AST 22 19  ALT 21 23  ALKPHOS 60 53   BILITOT 0.4 0.4  PROT 6.7 6.3*  ALBUMIN 3.6 2.7*    CBG: Recent Labs  Lab 02/14/23 0458 02/14/23 0815  GLUCAP 141* 157*     Recent Results (from the past 240 hour(s))  Blood Culture (routine x 2)     Status: None (Preliminary result)   Collection Time: 02/13/23  6:40 PM   Specimen: BLOOD  Result Value Ref Range Status   Specimen Description   Final    BLOOD BLOOD RIGHT ARM Performed at [facility], [address], [place], [place] [address]    Special Requests   Final    BOTTLES DRAWN AEROBIC AND ANAEROBIC Blood Culture results may not be optimal due to an excessive volume of blood received in culture bottles Performed at [facility], [address], [place], [place] [address]    Culture   Final    NO GROWTH < 12 HOURS Performed at [facility], [address] [place], [place] [address]    Report Status PENDING  Incomplete  Blood Culture (routine x 2)     Status: None (Preliminary result)   Collection Time: 02/13/23  7:16 PM   Specimen: BLOOD RIGHT WRIST  Result Value Ref Range Status   Specimen Description   Final    BLOOD RIGHT WRIST Performed at [facility], [address], [place], [place] [address]    Special Requests   Final    BOTTLES DRAWN AEROBIC AND ANAEROBIC Blood Culture adequate volume Performed at [facility], [address], [place], [place] [address]    Culture   Final    NO GROWTH < 12 HOURS Performed at [facility], [address], [place], [place] [address]    Report Status PENDING  Incomplete         Radiology Studies: No results found.      Scheduled Meds:  aspirin EC  81 mg Oral q morning   atorvastatin  80 mg Oral Daily   clopidogrel  75 mg Oral Daily   dicyclomine  10 mg Oral TID   ezetimibe  10 mg Oral q morning   famotidine  40 mg Oral BID   insulin aspart  0-9 Units Subcutaneous TID WC   losartan  25 mg Oral Daily   metoprolol  succinate  75 mg Oral q morning   pantoprazole  40 mg Oral BID   Continuous Infusions:  vancomycin       LOS: 1 day    Time spent: over 30 min    Lacretia Nicks, MD [facility]   To contact the attending provider between 7A-7P or the covering provider during after hours 7P-7A, please log into the web site [email or web address] and access using universal [place] password for that web site. If you do  not have the password, please call the hospital operator.  02/14/2023, 8:52 AM

## 2023-02-14 NOTE — H&P (Signed)
History and Physical      [name] [name]:[id number] DOB: 1941-01-03 DOA: 02/13/2023; DOS: 02/14/2023  PCP: Raymon Mutton., FNP  Patient coming from: home   I have personally briefly reviewed patient's old medical records in [facility]  Chief Complaint: Right lower extremity erythema  HPI: [name] [name] is a 82 y.o. female with medical history significant for type 2 diabetes mellitus, essential pretension, hyperlipidemia, chronic diastolic heart failure, chronic hyponatremia, who is admitted to [facility] on 02/13/2023 by way of transfer from [facility] with cellulitis of the right lower extremity after presenting from home to [facility] ED complaining of right lower extremity erythema.   The patient reports 3 to 4 days of right lower extremity erythema, increased warmth, swelling, tenderness involving the medial aspect of the right lower extremity proximal to the right knee.  Over the last 2 days she is also noted development of purulent drainage from the site.  In this context, she was evaluated by her PCP on Tuesday, 02/12/2023, and started on clindamycin at that time.  However, in spite of good compliance in the interval on this clindamycin, she has noted further progression of her erythema, tenderness, increased warmth, swelling associated with the medial aspect of her right lower extremity proximal to the right knee, prompting her to present to [facility] emergency department this evening for further evaluation management thereof.  She reports that this has been associate with an objective fever at home, noting temperature max of 101 over the last 24 hours at home.  She also notes associated chills, in the absence of full body rigors or generalized myalgias.  Denies any rash or any other site.  No recent trauma to the right lower extremity.  Denies any acute right lower extremity numbness, paresthesias, or acute focal weakness involving the right lower extremity.  Medical  history notable for type 2 diabetes mellitus as well as chronic hyponatremia, with serum sodium range noted to be 1 18-1 30 since January 2024, with most recent prior serum sodium data point noted to be 130 on 09/29/2022.     [facility] ED Course:  Vital signs in the ED were notable for the following: Temperature max 99.4; heart rate in the 70s to 80s; systolic pressures in the 1 teens to 150s; respiratory rate 15-18, oxygen saturation 95 to 99% on room air.  Labs were notable for the following: CMP notable for the following: Sodium 128, chloride 91, bicarb of 28, creatinine 0.81, glucose 139, calcium, just for mild hypokalemia noted to be 8.9, abdomen 3.6, otherwise, liver enzymes within normal limits.  Lactic acid 1.3 CBC notable for open cell count 12,800, hemoglobin 12.5.  INR 1.1.  Urinalysis associated with no white blood cells and was leukocyte esterase/nitrate negative.  Blood cultures x 2 were collected prior to initiation of IV antibiotics [facility].  Per my interpretation, EKG in ED demonstrated the following:  (No EKG performed)  Imaging and additional notable ED work-up: (No imaging performed at [facility] this evening)  I&D performed by EDP at [facility] this evening, associate with purulent drainage.  While in the ED, the following were administered: IV vancomycin, normal saline x 500 cc bolus.  Subsequently, the patient was admitted to [facility] for further evaluation and management of right lower extremity cellulitis and abscess, status post I&D.     Review of Systems: As per HPI otherwise 10 point review of systems negative.   Past Medical History:  Diagnosis Date   Allergy  Anal fissure    Anxiety    CAD (coronary artery disease)    s/p inf MI 2007 - tx w/ DES to RCA // s/p POBA to [facility] and DES to dRCA in 11/18 ([facility] in [place], [place]) // Myoview 3/21: low risk // s/p DES to pRCA   Cataract    removed both eyes   Cerebrovascular disease    Carotid US  09/2017 [facility]): mild plaque (<50%) in both carotid arteries   Chronic neck pain    Chronic pain syndrome    Diabetes Mellitus, Type 2    Diverticular disease    DJD (degenerative joint disease)    Echocardiogram 10/2018    Echo 10/2018: EF 55-60, normal RVSF, mod MAC, mod TR, severe AoV calcification and sclerosis with nodular calcium/mobile area of calcium in the LVOT (small veg vs Lambl's excrescence  - consider TEE), mild AI, mild AS (mean 11).    Echocardiogram 04/2019    Echocardiogram 04/2019: EF 60-65, basal septal hypertrophy, grade 2 diastolic dysfunction, normal wall motion, normal RV SF, mild LAE, mod MAC, trivial MR, mod sclerosis of the aortic valve with mod aortic annular calcification, thin mobile filamentous structure on ventricular side of AV likely representing Lambl's excrescence, mild AI, mild TR   Frequent UTI's    Gastroesophageal reflux disease    H/O bacterial endocarditis    Rx w IV Abxs in 2020 ([facility] in [place], [place]) // F/u echo with AoV Lambl's excrescence   H/O hiatal hernia    HTN (hypertension)    Hx of fall 10/2020   Hx of MI 2007   Hx of Stroke    IBS (irritable bowel syndrome)    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 (Cervical Fusion)   Premature ventricular contractions    Tubular adenoma of colon    Ulcer    sam [facility]   Urinary incontinence     Past Surgical History:  Procedure Laterality Date   APPENDECTOMY  1955   BLADDER REPAIR  2007   BRONCHIAL BIOPSY  05/17/2022   Procedure: BRONCHIAL BIOPSIES;  Surgeon: Omar Person, MD;  Location: [facility] ENDOSCOPY;  Service: Pulmonary;;   BRONCHIAL BRUSHINGS  05/17/2022   Procedure: BRONCHIAL  BRUSHINGS;  Surgeon: Omar Person, MD;  Location: [facility] ENDOSCOPY;  Service: Pulmonary;;   BRONCHIAL NEEDLE ASPIRATION BIOPSY  05/17/2022   Procedure: BRONCHIAL NEEDLE ASPIRATION BIOPSIES;  Surgeon: Omar Person, MD;  Location: [facility] ENDOSCOPY;  Service: Pulmonary;;   CARDIAC CATHETERIZATION  2007   Stents   CERVICAL SPINE SURGERY  07/2005   Dr Laurrie East   CHOLECYSTECTOMY  2007   COLONOSCOPY     CORONARY PRESSURE/FFR WITH 3D MAPPING N/A 06/28/2021   Procedure: Coronary Pressure Wire/FFR w/3D Mapping;  Surgeon: Kathleene Hazel, MD;  Location: [facility] INVASIVE CV LAB;  Service: Cardiovascular;  Laterality: N/A;   CORONARY STENT INTERVENTION N/A 03/09/2020   Procedure: CORONARY STENT INTERVENTION;  Surgeon: Kathleene Hazel, MD;  Location: [facility] INVASIVE CV LAB;  Service: Cardiovascular;  Laterality: N/A;   CORONARY STENT INTERVENTION N/A 06/28/2021   Procedure: CORONARY STENT INTERVENTION;  Surgeon: Kathleene Hazel, MD;  Location: [facility] INVASIVE CV LAB;  Service: Cardiovascular;  Laterality: N/A;   DENTAL SURGERY  05/2013   replaced an inplant   EYE SURGERY  2011   Bilteral   FIDUCIAL MARKER PLACEMENT  05/17/2022   Procedure: FIDUCIAL MARKER PLACEMENT;  Surgeon: Omar Person, MD;  Location: [facility] ENDOSCOPY;  Service: Pulmonary;;  LUL   I & D KNEE WITH POLY EXCHANGE  09/25/2011   Procedure: IRRIGATION AND DEBRIDEMENT KNEE WITH POLY EXCHANGE;  Surgeon: Eulas Post, MD;  Location: [facility] OR;  Service: Orthopedics;  Laterality: Left;   INCISION AND DRAINAGE ABSCESS / HEMATOMA OF BURSA / KNEE / THIGH  09/2011   JOINT REPLACEMENT  2012   left   LEFT HEART CATH AND CORONARY ANGIOGRAPHY N/A 03/09/2020   Procedure: LEFT HEART CATH AND CORONARY ANGIOGRAPHY;  Surgeon: Kathleene Hazel, MD;  Location: [facility] INVASIVE CV LAB;  Service: Cardiovascular;  Laterality: N/A;   LEFT HEART CATH AND CORONARY ANGIOGRAPHY N/A 06/28/2021   Procedure: LEFT HEART CATH AND CORONARY ANGIOGRAPHY;  Surgeon:  Kathleene Hazel, MD;  Location: [facility] INVASIVE CV LAB;  Service: Cardiovascular;  Laterality: N/A;   left Total Knee Replacement  11/2010   Dr Eulah Pont   NECK SURGERY     has had 3 surgeries   POLYPECTOMY     POSTERIOR CERVICAL FUSION/FORAMINOTOMY  04/08/2012   Procedure: POSTERIOR CERVICAL FUSION/FORAMINOTOMY LEVEL 2;  Surgeon: Clydene Fake, MD;  Location: [facility] NEURO ORS;  Service: Neurosurgery;  Laterality: Left;  Left Cervical six-seven Foraminotomy, bilateral cervical seven-thoracic one foraminotomy, cervical six-seven, cervical seven-thoracic one fusion with posterior instrumentation   TEMPOROMANDIBULAR JOINT SURGERY     thumb surgery     TONSILLECTOMY     TONSILLECTOMY  1950   TOTAL ABDOMINAL HYSTERECTOMY     VIDEO BRONCHOSCOPY WITH RADIAL ENDOBRONCHIAL ULTRASOUND  05/17/2022   Procedure: VIDEO BRONCHOSCOPY WITH RADIAL ENDOBRONCHIAL ULTRASOUND;  Surgeon: Omar Person, MD;  Location: [facility] ENDOSCOPY;  Service: Pulmonary;;    Social History:  reports that she has never smoked. She has never been exposed to tobacco smoke. She has never used smokeless tobacco. She reports that she does not drink alcohol and does not use drugs.   Allergies  Allergen Reactions   Macrobid [Nitrofurantoin] Nausea Only    Severe nausea   Sulfa Antibiotics Itching   Niacin And Related Other (See Comments)    Must take "Flush-free"   Codeine Nausea Only   Prednisone Itching and Rash   Rocephin [Ceftriaxone] Itching   Sulfonamide Derivatives Itching   Tetracycline Itching and Rash    Family History  Problem Relation Age of Onset   Heart disease Mother    Pancreatic cancer Mother    Lung cancer Brother    Cancer - Other Brother        vocal cord cancer   Colon polyps Sister    Coronary artery disease Other        sibling   Coronary artery disease Other        sibling   Colon cancer Neg Hx    Esophageal cancer Neg Hx    Rectal cancer Neg Hx    Stomach cancer Neg Hx     Family history  reviewed and not pertinent    Prior to Admission medications   Medication Sig Start Date End Date Taking? Authorizing Provider  acetaminophen (TYLENOL) 325 MG tablet Take 650 mg by mouth every 6 (six) hours as needed for moderate pain.    [provider]  amLODipine (NORVASC) 5 MG tablet Take 1 tablet (5 mg total) by mouth daily. 11/07/21   Myrlene Broker, MD  Ascorbic Acid (VITAMIN C) 500 MG CAPS Take 500 mg by mouth in the morning and at bedtime.    [provider]  aspirin EC 81 MG tablet Take 81 mg by mouth every morning.     [provider]  atorvastatin (LIPITOR) 80 MG tablet TAKE ONE TABLET BY MOUTH EVERYDAY AT BEDTIME 08/06/22   Etta Grandchild, MD  clopidogrel (PLAVIX) 75 MG tablet TAKE ONE CAPSULE BY MOUTH EVERY MORNING 08/06/22   Etta Grandchild, MD  dicyclomine (BENTYL) 10 MG capsule Take 10 mg by mouth 3 (three) times daily.    [provider]  ezetimibe (ZETIA) 10 MG tablet TAKE ONE TABLET BY MOUTH EVERY MORNING 08/06/22   Etta Grandchild, MD  famotidine (PEPCID) 40 MG tablet Take 40 mg by mouth 2 (two) times daily. 12/06/22   [provider]  losartan (COZAAR) 25 MG tablet Take 1 tablet (25 mg total) by mouth daily. 02/12/22   Alver Sorrow, NP  metoprolol succinate (TOPROL-XL) 25 MG 24 hr tablet Take 3 tablets (75 mg total) by mouth every morning. 09/30/22 10/30/22  Berton Mount I, MD  nitroGLYCERIN (NITROSTAT) 0.4 MG SL tablet DISSOLVE 1 TABLET UNDER THE TONGUE EVERY 5 MINUTES AS NEEDED FOR CHEST PAIN. DO NOT EXCEED A TOTAL OF 3 DOSES IN 15 MINUTES. 11/29/21   Etta Grandchild, MD  Omega-3 Fatty Acids (FISH OIL) 1000 MG CAPS Take 1,000 mg by mouth in the morning and at bedtime.    [provider]  pantoprazole (PROTONIX) 40 MG tablet Take 40 mg by mouth 2 (two) times daily.    [provider]  Psyllium (DAILY FIBER PO) Take 1 capsule by mouth daily.    [provider]     Objective    Physical  Exam: Vitals:   02/13/23 2045 02/13/23 2100 02/13/23 2115 02/13/23 2255  BP:    (!) 156/69  Pulse: 73 73 74 80  Resp:    15  Temp:    99.4 F (37.4 C)  TempSrc:    Oral  SpO2: 95% 97% 93% 97%  Weight:    56.2 kg  Height:    4\' 11"  (1.499 m)    General: appears to be stated age; alert, oriented Skin: warm, dry; erythema, increased warmth, tenderness, swelling associated with the medial aspect of the right lower extremity proximal to the right knee Head:  AT/[place] Mouth:  Oral mucosa membranes appear moist, normal dentition Neck: supple; trachea midline Heart:  RRR; did not appreciate any M/R/G Lungs: CTAB, did not appreciate any wheezes, rales, or rhonchi Abdomen: + BS; soft, ND, NT Vascular: 2+ pedal pulses b/l; 2+ radial pulses b/l Extremities:no muscle wasting; right lower extremity erythema, tenderness, swelling, increased warmth, as further detailed above Neuro: strength and sensation intact in upper and lower extremities b/l    Labs on Admission: I have personally reviewed following labs and imaging studies  CBC: Recent Labs  Lab 02/13/23 1848  WBC 12.8*  NEUTROABS 8.6*  HGB 12.5  HCT 37.8  MCV 92.9  PLT 296   Basic Metabolic Panel: Recent Labs  Lab 02/13/23 1848  NA 128*  K 4.1  CL 91*  CO2 28  GLUCOSE 139*  BUN 15  CREATININE 0.81  CALCIUM 8.5*   GFR: Estimated Creatinine Clearance: 40.2 mL/min (by C-G formula based on SCr of  0.81 mg/dL). Liver Function Tests: Recent Labs  Lab 02/13/23 1848  AST 22  ALT 21  ALKPHOS 60  BILITOT 0.4  PROT 6.7  ALBUMIN 3.6   No results for input(s): "LIPASE", "AMYLASE" in the last 168 hours. No results for input(s): "AMMONIA" in the last 168 hours. Coagulation Profile: Recent Labs  Lab 02/13/23 1848  INR 1.1   Cardiac Enzymes: No results for input(s): "CKTOTAL", "CKMB", "CKMBINDEX", "TROPONINI" in the last 168 hours. BNP (last 3 results) No results for input(s): "PROBNP" in the last 8760  hours. HbA1C: No results for input(s): "HGBA1C" in the last 72 hours. CBG: No results for input(s): "GLUCAP" in the last 168 hours. Lipid Profile: No results for input(s): "CHOL", "HDL", "LDLCALC", "TRIG", "CHOLHDL", "LDLDIRECT" in the last 72 hours. Thyroid Function Tests: No results for input(s): "TSH", "T4TOTAL", "FREET4", "T3FREE", "THYROIDAB" in the last 72 hours. Anemia Panel: No results for input(s): "VITAMINB12", "FOLATE", "FERRITIN", "TIBC", "IRON", "RETICCTPCT" in the last 72 hours. Urine analysis:    Component Value Date/Time   COLORURINE YELLOW 02/13/2023 1840   APPEARANCEUR CLEAR 02/13/2023 1840   LABSPEC 1.012 02/13/2023 1840   PHURINE 6.0 02/13/2023 1840   GLUCOSEU NEGATIVE 02/13/2023 1840   GLUCOSEU NEGATIVE 03/05/2022 0841   HGBUR NEGATIVE 02/13/2023 1840   BILIRUBINUR NEGATIVE 02/13/2023 1840   BILIRUBINUR negative 03/10/2015 1516   KETONESUR NEGATIVE 02/13/2023 1840   PROTEINUR NEGATIVE 02/13/2023 1840   UROBILINOGEN 1.0 03/05/2022 0841   NITRITE NEGATIVE 02/13/2023 1840   LEUKOCYTESUR NEGATIVE 02/13/2023 1840    Radiological Exams on Admission: No results found.    Assessment/Plan   Principal Problem:   Cellulitis and abscess of right lower extremity Active Problems:   DM2 (diabetes mellitus, type 2) (HCC)   Hyperlipidemia   Essential hypertension   GERD (gastroesophageal reflux disease)   Chronic hyponatremia   Chronic diastolic CHF (congestive heart failure) (HCC)       #) Cellulitis of right lower extremity: dx on basis of 4 days of right lower extremity erythema associated with tenderness, increased warmth to the touch, swelling involving the medial aspect of the right lower extremity proximal to the right knee, which appears to be associated with abscess, status post I&D performed by EDP this evening, and associated with purulent drainage.  No evidence of crepitus on exam to suggest necrotizing fasciitis. Of note, associated extremity is  found to be neurovascularly intact.   Presentation is associated with the presence of a single SIRS criteria (leukocytosis), therefore source criteria not met for sepsis at this time.  However, in the setting of the presence of single SIRS criteria, criteria are met for patient's cellulitis to be considered of moderate severity.  No evidence of hemodynamic compromise.  Presentation meets criteria for patient's cellulitis to be considered moderate in severity given the presence of 1 SIRS criteria, but does not meet criteria to be considered severe in the absence of sepsis, immunocompromisation, or hypotension. Overall, in setting of purulent cellulitis of moderate severity, will proceed with IV Vanc initiated at [facility] earlier this evening based upon associated recommendations by antibiotic stewardship committee.   Plan: Abx in form of IV vancomycin, as above. Follow for results of blood cultures x2 collected today prior to initiation of IV antibiotics. repeat CBC with diff in AM.  Prn IV fentanyl.               #) Chronic hyponatremia: Documented history of such, with serum sodium range noted to be 1 18-1 30 since January 2024,  with presenting serum sodium level consistent with his baseline range.  Given her age, chronic contributory factors include reset osmostat.  Plan: Monitor strict I's and O's and daily weights.  CMP in the morning.                  #) Type 2 Diabetes Mellitus: documented history of such.  Managed via lifestyle modification in the absence of any outpatient use of exogenous insulin nor any oral hypoglycemic agents.  Most recent hemoglobin A1c noted to be 7.2% when checked in July 2023.  Presenting blood sugar noted to be 139.  Will closely monitor [place] glycemic trend given increased risk for relative fluctuations in serum glucose as consequence of presenting infectious process, as further detailed below..  Plan: accuchecks QAC and HS with low dose  SSI.  Add on hemoglobin A1c level.                  #) GERD: documented h/o such; on both Protonix as well as Pepcid as outpatient.   Plan: continue home PPI and H2 blocker.                   #) Essential Hypertension: documented h/o such, with outpatient antihypertensive regimen including amlodipine, metoprolol succinate, and losartan.  SBP's in the ED today: 1 teens to 150s mmHg. however, in the context of presenting acute infection, will be conservative with resumption of outpatient and hypertensive medications, as further detailed below.  Plan: Close monitoring of subsequent BP via routine VS. resume home metoprolol succinate and losartan.  Will hold home amlodipine for now in the context of presenting infectious process.                   #) Hyperlipidemia: documented h/o such. On high intensity atorvastatin as well as Zetia as outpatient.   Plan: continue home statin and Zetia.                  #) Chronic diastolic heart failure: documented history of such, with most recent echocardiogram performed in May 2023, which is notable for LVEF 60 to 65%, grade 1 diastolic dysfunction, normal right ventricular systolic function. No clinical evidence to suggest acutely decompensated heart failure at this time. home diuretic regimen reportedly consists of the following: None.   Plan: monitor strict I's & O's and daily weights. Repeat CMP in AM. Check serum mag level.         DVT prophylaxis: SCD's   Code Status: DNR/DNI, consistent with code status documentation from most recent prior hospitalization, and confirmed via patient Family Communication: none Disposition Plan: Per Rounding Team Consults called: none;  Admission status: Inpatient     I SPENT GREATER THAN 75  MINUTES IN CLINICAL CARE TIME/MEDICAL DECISION-MAKING IN COMPLETING THIS ADMISSION.      Chaney Born Feliz Herard DO [facility]  From 7PM -  7AM   02/14/2023, 12:38 AM

## 2023-02-14 NOTE — Progress Notes (Signed)
Pharmacy Antibiotic Note  [name] [name] is a 82 y.o. female admitted on 02/13/2023 with cellulitis.  Pharmacy has been consulted for Vancomycin dosing.WBC mildly elevated. Renal function ok.  Plan: Vancomycin 750 mg IV q24h >>>Estimated AUC: 544 Trend WBC, temp, renal function  F/U infectious work-up Drug levels as indicated   Height: 4\' 11"  (149.9 cm) Weight: 56.2 kg (123 lb 14.4 oz) IBW/kg (Calculated) : 43.2  Temp (24hrs), Avg:98.7 F (37.1 C), Min:97.9 F (36.6 C), Max:99.4 F (37.4 C)  Recent Labs  Lab 02/13/23 1840 02/13/23 1848  WBC  --  12.8*  CREATININE  --  0.81  LATICACIDVEN 1.1  --     Estimated Creatinine Clearance: 40.2 mL/min (by C-G formula based on SCr of 0.81 mg/dL).    Allergies  Allergen Reactions   Macrobid [Nitrofurantoin] Nausea Only    Severe nausea   Sulfa Antibiotics Itching   Niacin And Related Other (See Comments)    Must take "Flush-free"   Codeine Nausea Only   Prednisone Itching and Rash   Rocephin [Ceftriaxone] Itching   Sulfonamide Derivatives Itching   Tetracycline Itching and Rash    Abran Duke, PharmD, BCPS Clinical Pharmacist Phone: [phone]

## 2023-02-14 NOTE — Progress Notes (Signed)
Transition of Care [facility]) - Inpatient Brief Assessment   Patient Details  Name: [name] [name] MRN: [id number] Date of Birth: Nov 12, 1940  [facility] CM/SW Contact:    Janae Bridgeman, RN Phone Number: 02/14/2023, 2:46 PM   Clinical Narrative: No TOC needs determined at this time.  CM will continue to follow for TOC needs to return home when medically stable to discharge.   Transition of Care Asessment: Insurance and Status: (P) Insurance coverage has been reviewed Patient has primary care physician: (P) Yes Home environment has been reviewed: (P) Yes Prior level of function:: (P) Independent Prior/Current Home Services: (P) No current home services Social Determinants of Health Reivew: (P) SDOH reviewed no interventions necessary Readmission risk has been reviewed: (P) Yes Transition of care needs: (P) no transition of care needs at this time

## 2023-02-14 NOTE — Plan of Care (Signed)

## 2023-02-14 NOTE — Consult Note (Signed)
Consult Note  [name] [name] 1941/03/29  [id number].    Requesting MD: Lacretia Nicks, MD Chief Complaint/Reason for Consult: right thigh cellulitis/abscess HPI:  Patient is an 82 year old female who presented to the ED yesterday with right thigh cellulitis. She started having some redness from inner thigh 3-4 days prior to presentation and was started on clindamycin as an outpatient, first dose was 6/11. She went to the ED yesterday because she noted some drainage and worsening redness and pain. Febrile to 101 at home. Was doing some warm compresses at home as well. She underwent I&D in the ED yesterday and reports there has been a significant amount of drainage today. Reports pain is slightly improved since I&D. PMH otherwise significant for CAD, T2DM, HLD. On plavix. Several allergies recorded in chart.   ROS: Negative other than HPI  Family History  Problem Relation Age of Onset   Heart disease Mother    Pancreatic cancer Mother    Lung cancer Brother    Cancer - Other Brother        vocal cord cancer   Colon polyps Sister    Coronary artery disease Other        sibling   Coronary artery disease Other        sibling   Colon cancer Neg Hx    Esophageal cancer Neg Hx    Rectal cancer Neg Hx    Stomach cancer Neg Hx     Past Medical History:  Diagnosis Date   Allergy    Anal fissure    Anxiety    CAD (coronary artery disease)    s/p inf MI 2007 - tx w/ DES to RCA // s/p [facility] to [facility] and DES to dRCA in 11/18 ([facility] in [place], [place]) // Myoview 3/21: low risk // s/p DES to pRCA   Cataract    removed both eyes   Cerebrovascular disease    Carotid US 09/2017 [facility]): mild plaque (<50%) in both carotid arteries   Chronic neck pain    Chronic pain syndrome    Diabetes Mellitus, Type 2    Diverticular disease    DJD (degenerative joint disease)    Echocardiogram 10/2018    Echo 10/2018: EF 55-60, normal RVSF, mod MAC, mod TR, severe AoV calcification and  sclerosis with nodular calcium/mobile area of calcium in the LVOT (small veg vs Lambl's excrescence  - consider TEE), mild AI, mild AS (mean 11).    Echocardiogram 04/2019    Echocardiogram 04/2019: EF 60-65, basal septal hypertrophy, grade 2 diastolic dysfunction, normal wall motion, normal RV SF, mild LAE, mod MAC, trivial MR, mod sclerosis of the aortic valve with mod aortic annular calcification, thin mobile filamentous structure on ventricular side of AV likely representing Lambl's excrescence, mild AI, mild TR   Frequent UTI's    Gastroesophageal reflux disease    H/O bacterial endocarditis    Rx w IV Abxs in 2020 ([facility] in [place], [place]) // F/u echo with AoV Lambl's excrescence   H/O hiatal hernia    HTN (hypertension)    Hx of fall 10/2020   Hx of MI 2007   Hx of Stroke    IBS (irritable bowel syndrome)    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 (Cervical Fusion)   Premature ventricular contractions    Tubular adenoma of colon    Ulcer    [facility]   Urinary incontinence     Past Surgical History:  Procedure Laterality Date   APPENDECTOMY  1955   BLADDER REPAIR  2007   BRONCHIAL BIOPSY  05/17/2022   Procedure: BRONCHIAL BIOPSIES;  Surgeon: Omar Person, MD;  Location: [facility] ENDOSCOPY;  Service: Pulmonary;;   BRONCHIAL BRUSHINGS  05/17/2022   Procedure: BRONCHIAL BRUSHINGS;  Surgeon: Omar Person, MD;  Location: [facility] ENDOSCOPY;  Service: Pulmonary;;   BRONCHIAL NEEDLE ASPIRATION BIOPSY  05/17/2022   Procedure: BRONCHIAL NEEDLE ASPIRATION BIOPSIES;  Surgeon: Omar Person, MD;  Location: [facility] ENDOSCOPY;  Service: Pulmonary;;   CARDIAC CATHETERIZATION  2007   Stents    CERVICAL SPINE SURGERY  07/2005   Dr Rabecka East   CHOLECYSTECTOMY  2007   COLONOSCOPY     CORONARY PRESSURE/FFR WITH 3D MAPPING N/A 06/28/2021   Procedure: Coronary Pressure Wire/FFR w/3D Mapping;  Surgeon: Kathleene Hazel, MD;  Location: [facility] INVASIVE CV LAB;  Service: Cardiovascular;  Laterality: N/A;   CORONARY STENT INTERVENTION N/A 03/09/2020   Procedure: CORONARY STENT INTERVENTION;  Surgeon: Kathleene Hazel, MD;  Location: [facility] INVASIVE CV LAB;  Service: Cardiovascular;  Laterality: N/A;   CORONARY STENT INTERVENTION N/A 06/28/2021   Procedure: CORONARY STENT INTERVENTION;  Surgeon: Kathleene Hazel, MD;  Location: [facility] INVASIVE CV LAB;  Service: Cardiovascular;  Laterality: N/A;   DENTAL SURGERY  05/2013   replaced an inplant   EYE SURGERY  2011   Bilteral   FIDUCIAL MARKER PLACEMENT  05/17/2022   Procedure: FIDUCIAL MARKER PLACEMENT;  Surgeon: Omar Person, MD;  Location: [facility] ENDOSCOPY;  Service: Pulmonary;;  LUL   I & D KNEE WITH POLY EXCHANGE  09/25/2011   Procedure: IRRIGATION AND DEBRIDEMENT KNEE WITH POLY EXCHANGE;  Surgeon: Eulas Post, MD;  Location: [facility] OR;  Service: Orthopedics;  Laterality: Left;   INCISION AND DRAINAGE ABSCESS / HEMATOMA OF BURSA / KNEE / THIGH  09/2011   JOINT REPLACEMENT  2012   left   LEFT HEART CATH AND CORONARY ANGIOGRAPHY N/A 03/09/2020   Procedure: LEFT HEART CATH AND CORONARY ANGIOGRAPHY;  Surgeon: Kathleene Hazel, MD;  [facility]: [facility]VAS[facility] CV LAB;  Service: Cardiovascular;  Laterality: N/A;   LEFT HEART CATH AND CORONARY ANGIOGRAPHY N/A 06/28/2021   Procedure: LEFT HEART CATH AND CORONARY ANGIOGRAPHY;  Surgeon: Kathleene Hazel, MD;  [facility]: [facility] INVASIVE CV LAB;  Service: Cardiovascular;  Laterality: N/A;   left Total Knee Replacement  11/2010   Dr Eulah Pont   NECK SURGERY     has had 3 surgeries   POLYPECTOMY     POSTERIOR CERVICAL FUSION/FORAMINOTOMY  04/08/2012   Procedure: POSTERIOR CERVICAL FUSION/FORAMINOTOMY  LEVEL 2;  Surgeon: Clydene Fake, MD;  Location: [facility] NEURO ORS;  Service: Neurosurgery;  Laterality: Left;  Left Cervical six-seven Foraminotomy, bilateral cervical seven-thoracic one foraminotomy, cervical six-seven, cervical seven-thoracic one fusion with posterior instrumentation   TEMPOROMANDIBULAR JOINT SURGERY     thumb surgery     TONSILLECTOMY     TONSILLECTOMY  1950   TOTAL ABDOMINAL HYSTERECTOMY     VIDEO BRONCHOSCOPY WITH RADIAL ENDOBRONCHIAL ULTRASOUND  05/17/2022   Procedure: VIDEO BRONCHOSCOPY WITH RADIAL ENDOBRONCHIAL ULTRASOUND;  Surgeon: Omar Person, MD;  Location:  [facility] ENDOSCOPY;  Service: Pulmonary;;    Social History:  reports that she has never smoked. She has never been exposed to tobacco smoke. She has never used smokeless tobacco. She reports that she does not drink alcohol and does not use drugs.  Allergies:  Allergies  Allergen Reactions   Macrobid [Nitrofurantoin] Nausea Only    Severe nausea   Sulfa Antibiotics Itching   Niacin And Related Other (See Comments)    Must take "Flush-free"   Codeine Nausea Only   Prednisone Itching and Rash   Rocephin [Ceftriaxone] Itching   Sulfonamide Derivatives Itching   Tetracycline Itching and Rash    Medications Prior to Admission  Medication Sig Dispense Refill   acetaminophen (TYLENOL) 325 MG tablet Take 650 mg by mouth every 6 (six) hours as needed for moderate pain.     amLODipine (NORVASC) 5 MG tablet Take 1 tablet (5 mg total) by mouth daily. 90 tablet 3   Ascorbic Acid (VITAMIN C) 500 MG CAPS Take 500 mg by mouth in the morning and at bedtime.     aspirin EC 81 MG tablet Take 81 mg by mouth every morning.      atorvastatin (LIPITOR) 80 MG tablet TAKE ONE TABLET BY MOUTH EVERYDAY AT BEDTIME (Patient taking differently: Take 80 mg by mouth at bedtime.) 90 tablet 0   clindamycin (CLEOCIN) 300 MG capsule Take 300 mg by mouth 3 (three) times daily.     clopidogrel (PLAVIX) 75 MG tablet TAKE ONE CAPSULE BY MOUTH  EVERY MORNING (Patient taking differently: Take 75 mg by mouth every morning.) 90 tablet 0   dicyclomine (BENTYL) 10 MG capsule Take 10 mg by mouth 3 (three) times daily.     ezetimibe (ZETIA) 10 MG tablet TAKE ONE TABLET BY MOUTH EVERY MORNING (Patient taking differently: Take 10 mg by mouth in the morning.) 90 tablet 0   famotidine (PEPCID) 40 MG tablet Take 40 mg by mouth 2 (two) times daily.     gabapentin (NEURONTIN) 300 MG capsule Take 300 mg by mouth 3 (three) times daily.     losartan (COZAAR) 25 MG tablet Take 1 tablet (25 mg total) by mouth daily. 90 tablet 3   metoprolol succinate (TOPROL-XL) 25 MG 24 hr tablet Take 3 tablets (75 mg total) by mouth every morning. 90 tablet 0   montelukast (SINGULAIR) 10 MG tablet Take 10 mg by mouth at bedtime.     nitroGLYCERIN (NITROSTAT) 0.4 MG SL tablet DISSOLVE 1 TABLET UNDER THE TONGUE EVERY 5 MINUTES AS NEEDED FOR CHEST PAIN. DO NOT EXCEED A TOTAL OF 3 DOSES IN 15 MINUTES. (Patient taking differently: Place 0.4 mg under the tongue every 5 (five) minutes as needed for chest pain (DO NOT EXCEED A TOTAL OF 3 DOSES IN 15 MINUTES.).) 25 tablet 2   Omega-3 Fatty Acids (FISH OIL) 1000 MG CAPS Take 1,000 mg by mouth in the morning and at bedtime.     ondansetron (ZOFRAN) 4 MG tablet Take 4 mg by mouth every 8 (eight) hours as needed for vomiting or nausea.     pantoprazole (PROTONIX) 40 MG tablet Take 40 mg by mouth 2 (two) times daily.     Psyllium (DAILY FIBER PO) Take 1 capsule by mouth daily.     venlafaxine XR (EFFEXOR-XR) 37.5 MG 24 hr capsule Take 37.5 mg by mouth every morning.     fluconazole (DIFLUCAN) 150 MG tablet Take 150 mg by mouth every 3 (three) days. (Patient not taking: Reported on 02/14/2023)  Blood pressure (!) 150/84, pulse 81, temperature 98.3 F (36.8 C), resp. rate 15, height 4\' 11"  (1.499 m), weight 56.2 kg, SpO2 94 %. Physical Exam:  General: pleasant, WD, WN female who is laying in bed in NAD Lungs: Respiratory effort  nonlabored Skin: erythema and induration to right inner thigh, packing removed and not much drainage from small wound, no obvious fluctuant area suggesting undrained abscess cavity    Results for orders placed or performed during the hospital encounter of 02/13/23 (from the past 48 hour(s))  Lactic acid, plasma     Status: None   Collection Time: 02/13/23  6:40 PM  Result Value Ref Range   Lactic Acid, Venous 1.1 0.5 - 1.9 mmol/L    Comment: Performed at [facility], [address], [place], [place] [address]  Blood Culture (routine x 2)     Status: None (Preliminary result)   Collection Time: 02/13/23  6:40 PM   Specimen: BLOOD  Result Value Ref Range   Specimen Description      BLOOD BLOOD RIGHT ARM Performed at [facility], [address] [place], [place] [address]    Special Requests      BOTTLES DRAWN AEROBIC AND ANAEROBIC Blood Culture results may not be optimal due to an excessive volume of blood received in culture bottles Performed at [facility], [address] [place], [place] [address]    Culture      NO GROWTH < 12 HOURS Performed at [facility], [address] [place], [place] [address]    Report Status PENDING   Urinalysis, w/ Reflex to Culture (Infection Suspected) -Urine, Clean Catch     Status: Abnormal   Collection Time: 02/13/23  6:40 PM  Result Value Ref Range   Specimen Source URINE, CLEAN CATCH    Color, Urine YELLOW YELLOW   APPearance CLEAR CLEAR   Specific Gravity, Urine 1.012 1.005 - 1.030   pH 6.0 5.0 - 8.0   Glucose, UA NEGATIVE NEGATIVE mg/dL   Hgb urine dipstick NEGATIVE NEGATIVE   Bilirubin Urine NEGATIVE NEGATIVE   Ketones, ur NEGATIVE NEGATIVE mg/dL   Protein, ur NEGATIVE NEGATIVE mg/dL   Nitrite NEGATIVE NEGATIVE   Leukocytes,Ua NEGATIVE NEGATIVE   RBC / HPF 0-5 0 - 5 RBC/hpf   WBC, UA 0-5 0 - 5 WBC/hpf    Comment:        Reflex urine culture not performed if WBC  <=10, OR if Squamous epithelial cells >5. If Squamous epithelial cells >5 suggest recollection.    Bacteria, UA RARE (A) NONE SEEN   Squamous Epithelial / HPF 0-5 0 - 5 /HPF   Mucus PRESENT     Comment: Performed at [facility], [address], [place], [place] [address]  Comprehensive metabolic panel     Status: Abnormal   Collection Time: 02/13/23  6:48 PM  Result Value Ref Range   Sodium 128 (L) 135 - 145 mmol/L   Potassium 4.1 3.5 - 5.1 mmol/L   Chloride 91 (L) 98 - 111 mmol/L   CO2 28 22 - 32 mmol/L   Glucose, Bld 139 (H) 70 - 99 mg/dL    Comment: Glucose reference range applies only to samples taken after fasting for at least 8 hours.   BUN 15 8 - 23 mg/dL   Creatinine, Ser 4.16 0.44 - 1.00 mg/dL   Calcium 8.5 (L) 8.9 - 10.3 mg/dL   Total Protein 6.7 6.5 - 8.1 g/dL   Albumin  3.6 3.5 - 5.0 g/dL   AST 22 15 - 41 U/L   ALT 21 0 - 44 U/L   Alkaline Phosphatase 60 38 - 126 U/L   Total Bilirubin 0.4 0.3 - 1.2 mg/dL   GFR, Estimated >16 >10 mL/min    Comment: (NOTE) Calculated using the CKD-EPI Creatinine Equation (2021)    Anion gap 9 5 - 15    Comment: Performed at [facility], [address] [place], [place] [address]  CBC with Differential     Status: Abnormal   Collection Time: 02/13/23  6:48 PM  Result Value Ref Range   WBC 12.8 (H) 4.0 - 10.5 K/uL   RBC 4.07 3.87 - 5.11 MIL/uL   Hemoglobin 12.5 12.0 - 15.0 g/dL   HCT 40.9 81.1 - 91.4 %   MCV 92.9 80.0 - 100.0 fL   MCH 30.7 26.0 - 34.0 pg   MCHC 33.1 30.0 - 36.0 g/dL   RDW 78.2 95.6 - 21.3 %   Platelets 296 150 - 400 K/uL   nRBC 0.0 0.0 - 0.2 %   Neutrophils Relative % 66 %   Neutro Abs 8.6 (H) 1.7 - 7.7 K/uL   Lymphocytes Relative 17 %   Lymphs Abs 2.1 0.7 - 4.0 K/uL   Monocytes Relative 13 %   Monocytes Absolute 1.7 (H) 0.1 - 1.0 K/uL   Eosinophils Relative 2 %   Eosinophils Absolute 0.3 0.0 - 0.5 K/uL   Basophils Relative 1 %   Basophils Absolute 0.1 0.0 - 0.1  K/uL   Immature Granulocytes 1 %   Abs Immature Granulocytes 0.07 0.00 - 0.07 K/uL    Comment: Performed at [facility], [address] [place], [place] [address]  Protime-INR     Status: None   Collection Time: 02/13/23  6:48 PM  Result Value Ref Range   Prothrombin Time 14.1 11.4 - 15.2 seconds   INR 1.1 0.8 - 1.2    Comment: (NOTE) INR goal varies based on device and disease states. Performed at [facility], [address] [place], [place] [address]   APTT     Status: None   Collection Time: 02/13/23  6:48 PM  Result Value Ref Range   aPTT 31 24 - 36 seconds    Comment: Performed at [facility], [address], [place], [place] [address]  Blood Culture (routine x 2)     Status: None (Preliminary result)   Collection Time: 02/13/23  7:16 PM   Specimen: BLOOD RIGHT WRIST  Result Value Ref Range   Specimen Description      BLOOD RIGHT WRIST Performed at [facility], [address], [place], [place] [address]    Special Requests      BOTTLES DRAWN AEROBIC AND ANAEROBIC Blood Culture adequate volume Performed at [facility], [address], [place], [place] [address]    Culture      NO GROWTH < 12 HOURS Performed at [facility], [address] [place], [place] [address]    Report Status PENDING   Glucose, capillary     Status: Abnormal   Collection Time: 02/14/23  4:58 AM  Result Value Ref Range   Glucose-Capillary 141 (H) 70 - 99 mg/dL    Comment: Glucose reference range applies only to samples taken after fasting for at least 8 hours.  CBC with Differential/Platelet     Status: Abnormal   Collection Time: 02/14/23  5:00 AM  Result  Value Ref Range   WBC 9.8 4.0 - 10.5 K/uL   RBC 3.91 3.87 - 5.11 MIL/uL   Hemoglobin 12.0 12.0 - 15.0 g/dL   HCT 78.2 95.6 - 21.3 %   MCV 92.8 80.0 - 100.0 fL   MCH 30.7 26.0 - 34.0 pg   MCHC 33.1 30.0 - 36.0 g/dL   RDW 08.6 57.8 - 46.9 %    Platelets 294 150 - 400 K/uL   nRBC 0.0 0.0 - 0.2 %   Neutrophils Relative % 64 %   Neutro Abs 6.3 1.7 - 7.7 K/uL   Lymphocytes Relative 19 %   Lymphs Abs 1.9 0.7 - 4.0 K/uL   Monocytes Relative 12 %   Monocytes Absolute 1.2 (H) 0.1 - 1.0 K/uL   Eosinophils Relative 3 %   Eosinophils Absolute 0.3 0.0 - 0.5 K/uL   Basophils Relative 1 %   Basophils Absolute 0.1 0.0 - 0.1 K/uL   Immature Granulocytes 1 %   Abs Immature Granulocytes 0.06 0.00 - 0.07 K/uL    Comment: Performed at [facility], [address] [place], [place] [address]  Comprehensive metabolic panel     Status: Abnormal   Collection Time: 02/14/23  5:00 AM  Result Value Ref Range   Sodium 133 (L) 135 - 145 mmol/L   Potassium 3.9 3.5 - 5.1 mmol/L   Chloride 94 (L) 98 - 111 mmol/L   CO2 29 22 - 32 mmol/L   Glucose, Bld 144 (H) 70 - 99 mg/dL    Comment: Glucose reference range applies only to samples taken after fasting for at least 8 hours.   BUN 6 (L) 8 - 23 mg/dL   Creatinine, Ser 8.41 0.44 - 1.00 mg/dL   Calcium 8.5 (L) 8.9 - 10.3 mg/dL   Total Protein 6.3 (L) 6.5 - 8.1 g/dL   Albumin 2.7 (L) 3.5 - 5.0 g/dL   AST 19 15 - 41 U/L   ALT 23 0 - 44 U/L   Alkaline Phosphatase 53 38 - 126 U/L   Total Bilirubin 0.4 0.3 - 1.2 mg/dL   GFR, Estimated >32 >44 mL/min    Comment: (NOTE) Calculated using the CKD-EPI Creatinine Equation (2021)    Anion gap 10 5 - 15    Comment: Performed at [facility], [address] [place], [place] [address]  Magnesium     Status: None   Collection Time: 02/14/23  5:00 AM  Result Value Ref Range   Magnesium 1.8 1.7 - 2.4 mg/dL    Comment: Performed at [facility], [address], [place], [place] [address]  Hemoglobin A1c     Status: Abnormal   Collection Time: 02/14/23  5:00 AM  Result Value Ref Range   Hgb A1c MFr Bld 7.1 (H) 4.8 - 5.6 %    Comment: (NOTE) Pre diabetes:          5.7%-6.4%  Diabetes:              >6.4%  Glycemic control for    <7.0% adults with diabetes    Mean Plasma Glucose 157.07 mg/dL    Comment: Performed at [facility], [address] [place], [place] [address]  Glucose, capillary     Status: Abnormal   Collection Time: 02/14/23  8:15 AM  Result Value Ref Range   Glucose-Capillary 157 (H) 70 - 99 mg/dL    Comment: Glucose reference range applies only to samples taken after fasting for  at least 8 hours.  Glucose, capillary     Status: Abnormal   Collection Time: 02/14/23 11:22 AM  Result Value Ref Range   Glucose-Capillary 160 (H) 70 - 99 mg/dL    Comment: Glucose reference range applies only to samples taken after fasting for at least 8 hours.   No results found.    Assessment/Plan Right thigh cellulitis with abscess - s/p I&D by ED provider 6/12 - afebrile, no leukocytosis and hemodynamically stable  - continue packing wound with 1/4" iodoform once daily to wick open and encourage drainage - warm compresses to encourage drainage and help with inflammation  - continue abx - transition to PO abx when erythema and induration improving  - CT reviewed and on prelim does not appear to be a large undrained abscess but will review final read - no indication for emergent surgical intervention today, ok to resume diet   FEN: HH diet  VTE: plavix ID: [id number] 6/12>>  I reviewed ED provider notes, hospitalist notes, last 24 h vitals and pain scores, last 48 h intake and output, last 24 h labs and trends, and last 24 h imaging results.   Juliet Rude, [facility] 02/14/2023, 1:11 PM Please see Amion for pager number during day hours 7:00am-4:30pm

## 2023-02-15 ENCOUNTER — Other Ambulatory Visit (HOSPITAL_COMMUNITY): Payer: Self-pay

## 2023-02-15 DIAGNOSIS — L02415 Cutaneous abscess of right lower limb: Secondary | ICD-10-CM | POA: Diagnosis not present

## 2023-02-15 DIAGNOSIS — L03115 Cellulitis of right lower limb: Secondary | ICD-10-CM | POA: Diagnosis not present

## 2023-02-15 LAB — GLUCOSE, CAPILLARY
Glucose-Capillary: 153 mg/dL — ABNORMAL HIGH (ref 70–99)
Glucose-Capillary: 161 mg/dL — ABNORMAL HIGH (ref 70–99)
Glucose-Capillary: 101 mg/dL — ABNORMAL HIGH (ref 70–99)
Glucose-Capillary: 138 mg/dL — ABNORMAL HIGH (ref 70–99)

## 2023-02-15 LAB — COMPREHENSIVE METABOLIC PANEL
ALT: 23 U/L (ref 0–44)
AST: 20 U/L (ref 15–41)
Albumin: 2.7 g/dL — ABNORMAL LOW (ref 3.5–5.0)
Alkaline Phosphatase: 60 U/L (ref 38–126)
Anion gap: 12 (ref 5–15)
BUN: 7 mg/dL — ABNORMAL LOW (ref 8–23)
CO2: 28 mmol/L (ref 22–32)
Calcium: 8.7 mg/dL — ABNORMAL LOW (ref 8.9–10.3)
Chloride: 96 mmol/L — ABNORMAL LOW (ref 98–111)
Creatinine, Ser: 0.72 mg/dL (ref 0.44–1.00)
GFR, Estimated: >60 mL/min (ref >60)
Glucose, Bld: 125 mg/dL — ABNORMAL HIGH (ref 70–99)
Potassium: 3.6 mmol/L (ref 3.5–5.1)
Sodium: 136 mmol/L (ref 135–145)
Total Bilirubin: 0.5 mg/dL (ref 0.3–1.2)
Total Protein: 6.1 g/dL — ABNORMAL LOW (ref 6.5–8.1)

## 2023-02-15 LAB — CBC WITH DIFFERENTIAL/PLATELET
Abs Immature Granulocytes: 0.04 10*3/uL (ref 0.00–0.07)
Basophils Absolute: 0.1 10*3/uL (ref 0.0–0.1)
Basophils Relative: 1 %
Eosinophils Absolute: 0.3 10*3/uL (ref 0.0–0.5)
Eosinophils Relative: 4 %
HCT: 38.6 % (ref 36.0–46.0)
Hemoglobin: 12.8 g/dL (ref 12.0–15.0)
Immature Granulocytes: 1 %
Lymphocytes Relative: 25 %
Lymphs Abs: 1.8 10*3/uL (ref 0.7–4.0)
MCH: 30.4 pg (ref 26.0–34.0)
MCHC: 33.2 g/dL (ref 30.0–36.0)
MCV: 91.7 fL (ref 80.0–100.0)
Monocytes Absolute: 1 10*3/uL (ref 0.1–1.0)
Monocytes Relative: 14 %
Neutro Abs: 4 10*3/uL (ref 1.7–7.7)
Neutrophils Relative %: 55 %
Platelets: 332 10*3/uL (ref 150–400)
RBC: 4.21 MIL/uL (ref 3.87–5.11)
RDW: 12.6 % (ref 11.5–15.5)
WBC: 7.2 10*3/uL (ref 4.0–10.5)
nRBC: 0 % (ref 0.0–0.2)

## 2023-02-15 LAB — PHOSPHORUS: Phosphorus: 4.7 mg/dL — ABNORMAL HIGH (ref 2.5–4.6)

## 2023-02-15 LAB — MAGNESIUM: Magnesium: 1.8 mg/dL (ref 1.7–2.4)

## 2023-02-15 MED ORDER — LINEZOLID 600 MG PO TABS
600.0000 mg | ORAL_TABLET | Freq: Two times a day (BID) | ORAL | 0 refills | Status: AC
  Filled 2023-02-15: qty 14, 7d supply, fill #0

## 2023-02-15 MED ORDER — LINEZOLID 600 MG PO TABS
600.0000 mg | ORAL_TABLET | Freq: Two times a day (BID) | ORAL | Status: DC
  Administered 2023-02-15 – 2023-02-16 (×2): 600 mg via ORAL
  Filled 2023-02-15 (×3): qty 1

## 2023-02-15 NOTE — Progress Notes (Signed)
PROGRESS NOTE    [name]  WUJ:[id number] DOB: 07/13/1941 DOA: 02/13/2023 PCP: Raymon Mutton., FNP  Chief Complaint  Patient presents with   Abscess    Brief Narrative:   [name] [name] is [name] [name] 82 y.o. female with medical history significant for type 2 diabetes mellitus, essential pretension, hyperlipidemia, chronic diastolic heart failure, chronic hyponatremia, who is admitted to [facility] on 02/13/2023 by way of transfer from [facility] with cellulitis of the right lower extremity after presenting from home to [facility] ED complaining of right lower extremity erythema.   Currently being treated for skin and soft tissue infection.  Assessment & Plan:   Principal Problem:   Cellulitis and abscess of right lower extremity Active Problems:   DM2 (diabetes mellitus, type 2) (HCC)   Hyperlipidemia   Essential hypertension   GERD (gastroesophageal reflux disease)   Chronic hyponatremia   Chronic diastolic CHF (congestive heart failure) (HCC)  Skin and Soft Tissue Infection Cellulitis  Abscess S/p I&D in ED, still large residual mass within cellulitic area, concern that she may have residual abscess requiring additional I&D CT with diffuse soft tissue swelling of right thigh with asymmetric skin thickening medially consistent with cellulitis, phlegmon without discrete residual abscess Appreciate surgery assistance Continue vancomycin to cover MRSA with purulent drainage -> after discussion with ID pharmacy, will transition to linezolid  Blood cultures NGx2  Leukocytosis Resolved  Hyponatremia Mild, improved  CAD  Last cath was 06/2021 with PTCA/DES x1 proximal LAD On DAPT  T2DM A1c 7.1 SSI  GERD PPI, H2 blocker (home meds)  HTN metoprolol, losartan Amlodipine   Dyslipidemia Statin, zetia  HFpEF Appears euvolemic    DVT prophylaxis: SCD Code Status: DNR Family Communication: none Disposition:   Status is: Inpatient Remains inpatient  appropriate because: need for continued inpatient care, possible I&D   Consultants:  General surgery  Procedures:  I&D in ED  Antimicrobials:  Anti-infectives (From admission, onward)    Start     Dose/Rate Route Frequency Ordered Stop   02/14/23 2000  vancomycin (VANCOREADY) IVPB 750 mg/150 mL        750 mg 150 mL/hr over 60 Minutes Intravenous Every 24 hours 02/14/23 0246     02/13/23 1930  vancomycin (VANCOCIN) IVPB 1000 mg/200 mL premix        1,000 mg 200 mL/hr over 60 Minutes Intravenous  Once 02/13/23 1918 02/13/23 2052       Subjective: Feeling progressively better  Objective: Vitals:   02/13/23 2115 02/13/23 2255 02/14/23 0457 02/14/23 0735  BP:  (!) 156/69 (!) 152/66 (!) 150/84  Pulse: 74 80 80 81  Resp:  15 15   Temp:  99.4 F (37.4 C) 98.3 F (36.8 C) 98.3 F (36.8 C)  TempSrc:  Oral    SpO2: 93% 97% 95% 94%  Weight:  56.2 kg    Height:  4\' 11"  (1.499 m)      Intake/Output Summary (Last 24 hours) at 02/14/2023 0852 Last data filed at 02/13/2023 2052 Gross per 24 hour  Intake 700 ml  Output --  Net 700 ml   Filed Weights   02/13/23 2255  Weight: 56.2 kg    Examination:  General: No acute distress. Lungs: unlabored Neurological: Alert and oriented 3. Moves all extremities 4 with equal strength. Cranial nerves II through XII grossly intact. Extremities: R thigh with erythema, discrete mass without fluctuance   Data Reviewed: I have personally reviewed following labs and imaging studies  CBC: Recent  Labs  Lab 02/13/23 1848 02/14/23 0500  WBC 12.8* 9.8  NEUTROABS 8.6* 6.3  HGB 12.5 12.0  HCT 37.8 36.3  MCV 92.9 92.8  PLT 296 294    Basic Metabolic Panel: Recent Labs  Lab 02/13/23 1848 02/14/23 0500  NA 128* 133*  K 4.1 3.9  CL 91* 94*  CO2 28 29  GLUCOSE 139* 144*  BUN 15 6*  CREATININE 0.81 0.68  CALCIUM 8.5* 8.5*  MG  --  1.8    GFR: Estimated Creatinine Clearance: 40.7 mL/min (by C-G formula based on SCr of 0.68  mg/dL).  Liver Function Tests: Recent Labs  Lab 02/13/23 1848 02/14/23 0500  AST 22 19  ALT 21 23  ALKPHOS 60 53  BILITOT 0.4 0.4  PROT 6.7 6.3*  ALBUMIN 3.6 2.7*    CBG: Recent Labs  Lab 02/14/23 0458 02/14/23 0815  GLUCAP 141* 157*     Recent Results (from the past 240 hour(s))  Blood Culture (routine x 2)     Status: None (Preliminary result)   Collection Time: 02/13/23  6:40 PM   Specimen: BLOOD  Result Value Ref Range Status   Specimen Description   Final    BLOOD BLOOD RIGHT ARM Performed at [facility], [address], [place], [place] [address]    Special Requests   Final    BOTTLES DRAWN AEROBIC AND ANAEROBIC Blood Culture results may not be optimal due to an excessive volume of blood received in culture bottles Performed at [facility], [address] [place], [place] [address]    Culture   Final    NO GROWTH < 12 HOURS Performed at [facility], [address] [place], [place] [address]    Report Status PENDING  Incomplete  Blood Culture (routine x 2)     Status: None (Preliminary result)   Collection Time: 02/13/23  7:16 PM   Specimen: BLOOD RIGHT WRIST  Result Value Ref Range Status   Specimen Description   Final    BLOOD RIGHT WRIST Performed at [facility], [address] [place], [place] [address]    Special Requests   Final    BOTTLES DRAWN AEROBIC AND ANAEROBIC Blood Culture adequate volume Performed at [facility], [address], [place], [place] [address]    Culture   Final    NO GROWTH < 12 HOURS Performed at [facility], [address], [place], [place] [address]    Report Status PENDING  Incomplete         Radiology Studies: No results found.      Scheduled Meds:  aspirin EC  81 mg Oral q morning   atorvastatin  80 mg Oral Daily   clopidogrel  75 mg Oral Daily   dicyclomine  10 mg Oral TID   ezetimibe  10 mg Oral q morning    famotidine  40 mg Oral BID   insulin aspart  0-9 Units Subcutaneous TID WC   losartan  25 mg Oral Daily   metoprolol succinate  75 mg Oral q morning   pantoprazole  40 mg Oral BID   Continuous Infusions:  vancomycin       LOS: 1 day    Time spent: over 30 min    Lacretia Nicks, MD [facility]   To contact the attending provider between 7A-7P or the covering provider during after hours 7P-7A, please log into the web site [email or web address] and access using universal [facility] password for that web site. If you do not have the password, please call the hospital operator.  02/14/2023, 8:52 AM

## 2023-02-15 NOTE — Plan of Care (Signed)

## 2023-02-15 NOTE — Plan of Care (Signed)
A/Ox4 and on room air. Prn tylenol given x1 for headache, with good effect. Up with self care. Right thigh abscess cleansed and repacked with iodoform. Remains on daily vanc.    Problem: Education: Goal: Knowledge of General Education information will improve Description: Including pain rating scale, medication(s)/side effects and non-pharmacologic comfort measures Outcome: Progressing   Problem: Health Behavior/Discharge Planning: Goal: Ability to manage health-related needs will improve Outcome: Progressing   Problem: Clinical Measurements: Goal: Ability to maintain clinical measurements within normal limits will improve Outcome: Progressing Goal: Will remain free from infection Outcome: Progressing Goal: Diagnostic test results will improve Outcome: Progressing Goal: Respiratory complications will improve Outcome: Progressing Goal: Cardiovascular complication will be avoided Outcome: Progressing   Problem: Activity: Goal: Risk for activity intolerance will decrease Outcome: Progressing   Problem: Nutrition: Goal: Adequate nutrition will be maintained Outcome: Progressing   Problem: Coping: Goal: Level of anxiety will decrease Outcome: Progressing   Problem: Elimination: Goal: Will not experience complications related to bowel motility Outcome: Progressing Goal: Will not experience complications related to urinary retention Outcome: Progressing   Problem: Pain Managment: Goal: General experience of comfort will improve Outcome: Progressing   Problem: Safety: Goal: Ability to remain free from injury will improve Outcome: Progressing   Problem: Skin Integrity: Goal: Risk for impaired skin integrity will decrease Outcome: Progressing   Problem: Education: Goal: Ability to describe self-care measures that may prevent or decrease complications (Diabetes Survival Skills Education) will improve Outcome: Progressing Goal: Individualized Educational  Video(s) Outcome: Progressing   Problem: Coping: Goal: Ability to adjust to condition or change in health will improve Outcome: Progressing   Problem: Fluid Volume: Goal: Ability to maintain a balanced intake and output will improve Outcome: Progressing   Problem: Health Behavior/Discharge Planning: Goal: Ability to identify and utilize available resources and services will improve Outcome: Progressing Goal: Ability to manage health-related needs will improve Outcome: Progressing   Problem: Metabolic: Goal: Ability to maintain appropriate glucose levels will improve Outcome: Progressing   Problem: Nutritional: Goal: Maintenance of adequate nutrition will improve Outcome: Progressing Goal: Progress toward achieving an optimal weight will improve Outcome: Progressing   Problem: Skin Integrity: Goal: Risk for impaired skin integrity will decrease Outcome: Progressing   Problem: Tissue Perfusion: Goal: Adequacy of tissue perfusion will improve Outcome: Progressing

## 2023-02-15 NOTE — Care Management Important Message (Signed)
Important Message  Patient Details  Name: FREDERIKA WETZLER MRN: [id number] Date of Birth: [birth year]   Medicare Important Message Given:  Yes     Sharmila Wrobleski Stefan Church 02/15/2023, 2:42 PM

## 2023-02-15 NOTE — Progress Notes (Signed)
      Chief Complaint/Subjective: Thigh feeling better  Objective: Vital signs in last 24 hours: Temp:  [97.6 F (36.4 C)-98.3 F (36.8 C)] 98.3 F (36.8 C) (06/14 0708) Pulse Rate:  [73-78] 75 (06/14 0708) Resp:  [18] 18 (06/14 0425) BP: (124-150)/(66-103) 145/66 (06/14 0708) SpO2:  [93 %-96 %] 96 % (06/14 0708) Last BM Date : 02/14/23 Intake/Output from previous day: No intake/output data recorded.  PE: Gen: NAD Resp: nonlabored Card: RRR Ext: decreased erythema of the right thigh  Lab Results:  Recent Labs    02/14/23 0500 02/15/23 0457  WBC 9.8 7.2  HGB 12.0 12.8  HCT 36.3 38.6  PLT 294 332   Recent Labs    02/14/23 0500 02/15/23 0457  NA 133* 136  K 3.9 3.6  CL 94* 96*  CO2 29 28  GLUCOSE 144* 125*  BUN 6* 7*  CREATININE 0.68 0.72  CALCIUM 8.5* 8.7*   Recent Labs    02/13/23 1848  LABPROT 14.1  INR 1.1      Component Value Date/Time   NA 136 02/15/2023 0457   NA 137 02/12/2022 1408   K 3.6 02/15/2023 0457   CL 96 (L) 02/15/2023 0457   CO2 28 02/15/2023 0457   GLUCOSE 125 (H) 02/15/2023 0457   GLUCOSE 126 (H) 06/24/2006 0738   BUN 7 (L) 02/15/2023 0457   BUN 17 02/12/2022 1408   CREATININE 0.72 02/15/2023 0457   CALCIUM 8.7 (L) 02/15/2023 0457   PROT 6.1 (L) 02/15/2023 0457   ALBUMIN 2.7 (L) 02/15/2023 0457   AST 20 02/15/2023 0457   ALT 23 02/15/2023 0457   ALKPHOS 60 02/15/2023 0457   BILITOT 0.5 02/15/2023 0457   GFRNONAA >60 02/15/2023 0457   GFRAA 71 05/17/2020 1235    Assessment/Plan No residual fluid on imaging. Decreasing erythema. Continue antibiotics. No indication for additional surgical procedures.   LOS: 2 days   I reviewed last 24 h vitals and pain scores, last 48 h intake and output, last 24 h labs and trends, and last 24 h imaging results.  This care required moderate level of medical decision making.   De Blanch [facility] 02/15/2023, 9:41 AM Please see Amion for pager  number during day hours 7:00am-4:30pm or 7:00am -11:30am on weekends

## 2023-02-15 NOTE — Progress Notes (Signed)
Ok to transition to PO linezolid to complete 7d of therapy per Dr. Lowell Guitar.  Ulyses Southward, PharmD, BCIDP, AAHIVP, CPP Infectious Disease Pharmacist 02/15/2023 9:11 AM

## 2023-02-16 ENCOUNTER — Other Ambulatory Visit (HOSPITAL_COMMUNITY): Payer: Self-pay

## 2023-02-16 DIAGNOSIS — L02415 Cutaneous abscess of right lower limb: Secondary | ICD-10-CM | POA: Diagnosis not present

## 2023-02-16 DIAGNOSIS — L03115 Cellulitis of right lower limb: Secondary | ICD-10-CM | POA: Diagnosis not present

## 2023-02-16 LAB — CBC WITH DIFFERENTIAL/PLATELET
Abs Immature Granulocytes: 0.04 10*3/uL (ref 0.00–0.07)
Basophils Absolute: 0.1 10*3/uL (ref 0.0–0.1)
Basophils Relative: 1 %
Eosinophils Absolute: 0.3 10*3/uL (ref 0.0–0.5)
Eosinophils Relative: 4 %
HCT: 39.7 % (ref 36.0–46.0)
Hemoglobin: 13.1 g/dL (ref 12.0–15.0)
Immature Granulocytes: 1 %
Lymphocytes Relative: 30 %
Lymphs Abs: 2.2 10*3/uL (ref 0.7–4.0)
MCH: 30.1 pg (ref 26.0–34.0)
MCHC: 33 g/dL (ref 30.0–36.0)
MCV: 91.3 fL (ref 80.0–100.0)
Monocytes Absolute: 0.9 10*3/uL (ref 0.1–1.0)
Monocytes Relative: 12 %
Neutro Abs: 3.8 10*3/uL (ref 1.7–7.7)
Neutrophils Relative %: 52 %
Platelets: 373 10*3/uL (ref 150–400)
RBC: 4.35 MIL/uL (ref 3.87–5.11)
RDW: 12.5 % (ref 11.5–15.5)
WBC: 7.3 10*3/uL (ref 4.0–10.5)
nRBC: 0 % (ref 0.0–0.2)

## 2023-02-16 LAB — COMPREHENSIVE METABOLIC PANEL
ALT: 25 U/L (ref 0–44)
AST: 21 U/L (ref 15–41)
Albumin: 3 g/dL — ABNORMAL LOW (ref 3.5–5.0)
Alkaline Phosphatase: 61 U/L (ref 38–126)
Anion gap: 11 (ref 5–15)
BUN: 8 mg/dL (ref 8–23)
CO2: 28 mmol/L (ref 22–32)
Calcium: 8.7 mg/dL — ABNORMAL LOW (ref 8.9–10.3)
Chloride: 94 mmol/L — ABNORMAL LOW (ref 98–111)
Creatinine, Ser: 0.73 mg/dL (ref 0.44–1.00)
GFR, Estimated: >60 mL/min (ref >60)
Glucose, Bld: 149 mg/dL — ABNORMAL HIGH (ref 70–99)
Potassium: 3.8 mmol/L (ref 3.5–5.1)
Sodium: 133 mmol/L — ABNORMAL LOW (ref 135–145)
Total Bilirubin: 0.4 mg/dL (ref 0.3–1.2)
Total Protein: 6.4 g/dL — ABNORMAL LOW (ref 6.5–8.1)

## 2023-02-16 LAB — GLUCOSE, CAPILLARY
Glucose-Capillary: 140 mg/dL — ABNORMAL HIGH (ref 70–99)
Glucose-Capillary: 150 mg/dL — ABNORMAL HIGH (ref 70–99)

## 2023-02-16 LAB — PHOSPHORUS: Phosphorus: 4.2 mg/dL (ref 2.5–4.6)

## 2023-02-16 LAB — MAGNESIUM: Magnesium: 1.8 mg/dL (ref 1.7–2.4)

## 2023-02-16 MED ORDER — VENLAFAXINE HCL ER 37.5 MG PO CP24
37.5000 mg | ORAL_CAPSULE | Freq: Every morning | ORAL | Status: DC
  Administered 2023-02-16: 37.5 mg via ORAL
  Filled 2023-02-16: qty 1

## 2023-02-16 NOTE — Evaluation (Signed)
Occupational Therapy Evaluation Patient Details Name: [name] [name] MRN: [id number] DOB: 04-Jan-1941 Today's Date: 02/16/2023   History of Present Illness MARIPAT OSANTOWSKI is a 82 y.o. female cellulitis of the right lower extremity with erythema. 5/12 s/p I&D.  Follow up CT with diffuse soft tissue swelling of right thigh with asymmetric skin thickening medially consistent with cellulitis, phlegmon without discrete residual abscess. PMHx: type 2 diabetes mellitus, essential pretension, hyperlipidemia, chronic diastolic heart failure, chronic hyponatremia   Clinical Impression   Lucendia Herrlich was evaluated s/p the above admission list. She is indep for all ADL/IADLs without DME and live alone at baseline. Upon evaluation the pt was limited by mild discomfort at R thigh. Overall she demonstrated independence with all mobility and ADLs without AD/DME. Pt does not need further acute or follow up OT, recommend d/c home with support of family as needed. OT to sign off, thank you.       Recommendations for follow up therapy are one component of a multi-disciplinary discharge planning process, led by the attending physician.  Recommendations may be updated based on patient status, additional functional criteria and insurance authorization.   Assistance Recommended at Discharge PRN (pt's dtr planning on staying with pt initially)  Patient can return home with the following Assist for transportation    Functional Status Assessment  Patient has had a recent decline in their functional status and demonstrates the ability to make significant improvements in function in a reasonable and predictable amount of time.  Equipment Recommendations  None recommended by OT       Precautions / Restrictions Precautions Precautions: Fall Restrictions Weight Bearing Restrictions: No      Mobility Bed Mobility Overal bed mobility: Independent                  Transfers Overall transfer level: Independent                  General transfer comment: no AD      Balance Overall balance assessment: No apparent balance deficits (not formally assessed)                                         ADL either performed or assessed with clinical judgement   ADL Overall ADL's : Independent                                       General ADL Comments: indep for all BADLs and functional mobility, pt left sponge bathing at the sink     Vision Baseline Vision/History: 1 Wears glasses (to read) Vision Assessment?: No apparent visual deficits     Perception Perception Perception Tested?: No   Praxis Praxis Praxis tested?: Within functional limits    Pertinent Vitals/Pain Pain Assessment Pain Assessment: 0-10 Pain Score: 2  Pain Location: R thigh Pain Descriptors / Indicators: Discomfort Pain Intervention(s): Monitored during session, Limited activity within patient's tolerance     Hand Dominance Right   Extremity/Trunk Assessment Upper Extremity Assessment Upper Extremity Assessment: Overall WFL for tasks assessed   Lower Extremity Assessment Lower Extremity Assessment: Overall WFL for tasks assessed   Cervical / Trunk Assessment Cervical / Trunk Assessment: Normal   Communication Communication Communication: No difficulties   Cognition Arousal/Alertness: Awake/alert Behavior During Therapy: WFL for tasks assessed/performed Overall Cognitive Status:  Within Functional Limits for tasks assessed                                 General Comments: Can be verbose and self-distracting but easily re-directed     General Comments  VSS on RA, RN present to give meds.            Home Living Family/patient expects to be discharged to:: Private residence Living Arrangements: Children Available Help at Discharge: Available PRN/intermittently;Family Type of Home: Apartment Home Access: Elevator     Home Layout: One level      Bathroom Shower/Tub: Chief Strategy Officer: Standard Bathroom Accessibility: Yes   Home Equipment: Cane - single point;Rollator (4 wheels) (walker is still in box)          Prior Functioning/Environment Prior Level of Function : Independent/Modified Independent             Mobility Comments: no AD, denies falls. Pt was active with PT after admission adn d/c in Feb, reports she still does her PT exercises ADLs Comments: indep wtih ADL/IADLs including driving. Meds come in a pill pack and lunches are delivered through meals on wheels        OT Problem List: Decreased activity tolerance;Decreased knowledge of precautions;Pain         OT Goals(Current goals can be found in the care plan section) Acute Rehab OT Goals Patient Stated Goal: home today OT Goal Formulation: With patient Time For Goal Achievement: 02/16/23 Potential to Achieve Goals: Good         AM-PAC OT "6 Clicks" Daily Activity     Outcome Measure Help from another person eating meals?: None Help from another person taking care of personal grooming?: None Help from another person toileting, which includes using toliet, bedpan, or urinal?: None Help from another person bathing (including washing, rinsing, drying)?: None Help from another person to put on and taking off regular upper body clothing?: None Help from another person to put on and taking off regular lower body clothing?: None 6 Click Score: 24   End of Session Nurse Communication: Mobility status  Activity Tolerance: Patient tolerated treatment well Patient left: Other (comment) (completing ADLs)  OT Visit Diagnosis: Pain                Time: 1610-9604 OT Time Calculation (min): 17 min Charges:  OT General Charges $OT Visit: 1 Visit OT Evaluation $OT Eval Low Complexity: 1 Low  Derenda Mis, OTR/L Acute Rehabilitation Services Office [phone] Secure Chat Communication Preferred   Donia Pounds 02/16/2023,  9:22 AM

## 2023-02-16 NOTE — Discharge Summary (Signed)
Physician Discharge Summary  AMARAH DOME WGN:[id number] DOB: 04-13-1941 DOA: 02/13/2023  PCP: Raymon Mutton., FNP  Admit date: 02/13/2023 Discharge date: 02/16/2023  Time spent: 40 minutes  Recommendations for Outpatient Follow-up:  Follow outpatient CBC/CMP  Follow wound care outpatient   Discharge Diagnoses:  Principal Problem:   Cellulitis and abscess of right lower extremity Active Problems:   DM2 (diabetes mellitus, type 2) (HCC)   Hyperlipidemia   Essential hypertension   GERD (gastroesophageal reflux disease)   Chronic hyponatremia   Chronic diastolic CHF (congestive heart failure) (HCC)   Discharge Condition: stable  Diet recommendation: heart healthy, diabetic  Filed Weights   02/13/23 2255  Weight: 56.2 kg    History of present illness:   LAQUILLA ANGERMEIER is Cephas Revard 82 y.o. female with medical history significant for type 2 diabetes mellitus, essential pretension, hyperlipidemia, chronic diastolic heart failure, chronic hyponatremia, who is admitted to [facility] on 02/13/2023 by way of transfer from [facility] with cellulitis of the right lower extremity after presenting from home to [facility] ED complaining of right lower extremity erythema.    Currently being treated for skin and soft tissue infection.  [name] improved after I&D and with abx.  Will discharge on linezolid.  Follow outpatient with PCP.  Hospital Course:  Assessment and Plan:  Skin and Soft Tissue Infection Cellulitis  Abscess S/p I&D in ED, still large residual mass within cellulitic area, concern that she may have residual abscess requiring additional I&D CT with diffuse soft tissue swelling of right thigh with asymmetric skin thickening medially consistent with cellulitis, phlegmon without discrete residual abscess Appreciate surgery assistance Continue vancomycin to cover MRSA with purulent drainage -> after discussion with ID pharmacy, will transition to linezolid at discharge Continue  wound packing at discharge, follow with PCP outpatient Blood cultures NGx2   Leukocytosis Resolved   Hyponatremia Mild   CAD  Last cath was 06/2021 with PTCA/DES x1 proximal LAD On DAPT   T2DM A1c 7.1 SSI   GERD PPI, H2 blocker (home meds)   HTN metoprolol, losartan Amlodipine   Dyslipidemia Statin, zetia   HFpEF Appears euvolemic     Procedures: I&D by EDP   Consultations: surgery  Discharge Exam: Vitals:   02/16/23 0438 02/16/23 0718  BP: (!) 156/60 (!) 142/68  Pulse: 74 78  Resp: 18   Temp: 98.7 F (37.1 C) 97.9 F (36.6 C)  SpO2: 95% 97%   Feeling better Feels comfortable returning home  General: No acute distress. Cardiovascular: RRR Lungs: unlabored Neurological: Alert and oriented 3. Moves all extremities 4. Cranial nerves II through XII grossly intact. Skin: Warm and dry. No rashes or lesions. Extremities: R thigh less erythematous, less tender to palpation, persistent mass which appears to be improving in size  Discharge Instructions   Discharge Instructions     Call MD for:  difficulty breathing, headache or visual disturbances   Complete by: As directed    Call MD for:  extreme fatigue   Complete by: As directed    Call MD for:  hives   Complete by: As directed    Call MD for:  persistant dizziness or light-headedness   Complete by: As directed    Call MD for:  persistant nausea and vomiting   Complete by: As directed    Call MD for:  redness, tenderness, or signs of infection (pain, swelling, redness, odor or green/yellow discharge around incision site)   Complete by: As directed    Call MD  for:  severe uncontrolled pain   Complete by: As directed    Call MD for:  temperature >100.4   Complete by: As directed    Diet - low sodium heart healthy   Complete by: As directed    Discharge instructions   Complete by: As directed    You were seen for Tranise Forrest right thigh abscess and skin infection.  The abscess was drained by the  emergency department.  We got Caileigh Canche CT scan that did not show any other fluid collection that warranted drainage.  You've improved with antibiotics.  Continue changing the dressing and wound packing daily.  Follow up with your PCP as an outpatient to ensure that area continues to heal well.  We'll discharge you home with another few days of antibiotics.  Return for new, recurrent, or worsening symptoms.  Please ask your PCP to request records from this hospitalization so they know what was done and what the next steps will be.   Discharge wound care:   Complete by: As directed    Pack wound daily with 1/4 inch iodoform   Increase activity slowly   Complete by: As directed       Allergies as of 02/16/2023       Reactions   Macrobid [nitrofurantoin] Nausea Only   Severe nausea   Sulfa Antibiotics Itching   Niacin And Related Other (See Comments)   Must take "Flush-free"   Codeine Nausea Only   Prednisone Itching, Rash   Rocephin [ceftriaxone] Itching   Sulfonamide Derivatives Itching   Tetracycline Itching, Rash        Medication List     STOP taking these medications    clindamycin 300 MG capsule Commonly known as: CLEOCIN   fluconazole 150 MG tablet Commonly known as: DIFLUCAN       TAKE these medications    acetaminophen 325 MG tablet Commonly known as: TYLENOL Take 650 mg by mouth every 6 (six) hours as needed for moderate pain.   amLODipine 5 MG tablet Commonly known as: NORVASC Take 1 tablet (5 mg total) by mouth daily.   aspirin EC 81 MG tablet Take 81 mg by mouth every morning.   atorvastatin 80 MG tablet Commonly known as: LIPITOR TAKE ONE TABLET BY MOUTH EVERYDAY AT BEDTIME What changed: See the new instructions.   clopidogrel 75 MG tablet Commonly known as: PLAVIX TAKE ONE CAPSULE BY MOUTH EVERY MORNING What changed: how much to take   DAILY FIBER PO Take 1 capsule by mouth daily.   dicyclomine 10 MG capsule Commonly known as: BENTYL Take 10  mg by mouth 3 (three) times daily.   ezetimibe 10 MG tablet Commonly known as: ZETIA TAKE ONE TABLET BY MOUTH EVERY MORNING What changed: when to take this   famotidine 40 MG tablet Commonly known as: PEPCID Take 40 mg by mouth 2 (two) times daily.   Fish Oil 1000 MG Caps Take 1,000 mg by mouth in the morning and at bedtime.   gabapentin 300 MG capsule Commonly known as: NEURONTIN Take 300 mg by mouth 3 (three) times daily.   linezolid 600 MG tablet Commonly known as: ZYVOX Take 1 tablet (600 mg total) by mouth 2 (two) times daily for 7 days.   losartan 25 MG tablet Commonly known as: COZAAR Take 1 tablet (25 mg total) by mouth daily.   metoprolol succinate 25 MG 24 hr tablet Commonly known as: TOPROL-XL Take 3 tablets (75 mg total) by mouth every morning.  montelukast 10 MG tablet Commonly known as: SINGULAIR Take 10 mg by mouth at bedtime.   nitroGLYCERIN 0.4 MG SL tablet Commonly known as: NITROSTAT DISSOLVE 1 TABLET UNDER THE TONGUE EVERY 5 MINUTES AS NEEDED FOR CHEST PAIN. DO NOT EXCEED Inetha Maret TOTAL OF 3 DOSES IN 15 MINUTES. What changed: See the new instructions.   ondansetron 4 MG tablet Commonly known as: ZOFRAN Take 4 mg by mouth every 8 (eight) hours as needed for vomiting or nausea.   pantoprazole 40 MG tablet Commonly known as: PROTONIX Take 40 mg by mouth 2 (two) times daily.   venlafaxine XR 37.5 MG 24 hr capsule Commonly known as: EFFEXOR-XR Take 37.5 mg by mouth every morning.   Vitamin C 500 MG Caps Take 500 mg by mouth in the morning and at bedtime.               Discharge Care Instructions  (From admission, onward)           Start     Ordered   02/16/23 0000  Discharge wound care:       Comments: Pack wound daily with 1/4 inch iodoform   02/16/23 0844           Allergies  Allergen Reactions   Macrobid [Nitrofurantoin] Nausea Only    Severe nausea   Sulfa Antibiotics Itching   Niacin And Related Other (See Comments)     Must take "Flush-free"   Codeine Nausea Only   Prednisone Itching and Rash   Rocephin [Ceftriaxone] Itching   Sulfonamide Derivatives Itching   Tetracycline Itching and Rash    Follow-up Information     Raymon Mutton., FNP. Schedule an appointment as soon as possible for Jaimya Feliciano visit.   Specialty: Family Medicine Why: Call the office [facility] schedule [facility] hospital follow up in the next 7-10 days. Contact information: [address] [place] [address] [phone]                  The results of significant diagnostics from this hospitalization (including imaging, microbiology, ancillary and laboratory) are listed below for reference.    Significant Diagnostic Studies: CT FEMUR RIGHT W CONTRAST  Result Date: 02/14/2023 CLINICAL DATA:  Right thigh cellulitis. Incision and drainage in the ED yesterday. Evaluate for residual abscess. EXAM: CT OF THE LOWER RIGHT EXTREMITY WITH CONTRAST TECHNIQUE: Multidetector CT imaging of the lower right extremity was performed according to the standard protocol following intravenous contrast administration. RADIATION DOSE REDUCTION: This exam was performed according to the departmental dose-optimization program which includes automated exposure control, adjustment of the mA and/or kV according to patient size and/or use of iterative reconstruction technique. CONTRAST:  75mL OMNIPAQUE IOHEXOL 350 MG/ML SOLN COMPARISON:  Right femur x-rays dated September 12, 2020. FINDINGS: Bones/Joint/Cartilage No fracture or dislocation. 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 Trace right knee joint effusion. Ligaments Ligaments are suboptimally evaluated by CT. Muscles and Tendons Grossly intact.  Right gluteus minimus muscle atrophy. Soft tissue Diffuse soft tissue swelling of the right thigh with asymmetric skin thickening medially. Small superficial skin ulceration in the medial upper thigh, likely the site of recent incision and drainage. Underlying irregular  ill-defined soft tissue density with tiny foci of subcutaneous emphysema. No discrete rim enhancing fluid collection. 3.1 cm simple lipoma between the sartorius and iliopsoas muscles. IMPRESSION: 1. Diffuse soft tissue swelling of the right thigh with asymmetric skin thickening medially, consistent with cellulitis. Small superficial skin ulceration in the medial upper thigh,  likely the site of recent incision and drainage. Underlying phlegmon without discrete residual abscess. Electronically Signed   By: Obie Dredge M.D.   On: 02/14/2023 14:57    Microbiology: Recent Results (from the past 240 hour(s))  Blood Culture (routine x 2)     Status: None (Preliminary result)   Collection Time: 02/13/23  6:40 PM   Specimen: BLOOD  Result Value Ref Range Status   Specimen Description   Final    BLOOD BLOOD RIGHT ARM Performed at [facility], [address] [place], [place] [address]    Special Requests   Final    BOTTLES DRAWN AEROBIC AND ANAEROBIC Blood Culture results may not be optimal due to an excessive volume of blood received in culture bottles Performed at [facility], [address] [place], [place] [address]    Culture   Final    NO GROWTH 3 DAYS Performed at [facility], [address], [place], [place] [address]    Report Status PENDING  Incomplete  Blood Culture (routine x 2)     Status: None (Preliminary result)   Collection Time: 02/13/23  7:16 PM   Specimen: BLOOD RIGHT WRIST  Result Value Ref Range Status   Specimen Description   Final    BLOOD RIGHT WRIST Performed at [facility], [address] [place], [place] [address]    Special Requests   Final    BOTTLES DRAWN AEROBIC AND ANAEROBIC Blood Culture adequate volume Performed at [facility], [address], [place], [place] [address]    Culture   Final    NO GROWTH 3 DAYS Performed at [facility], [address], [place],  [place] [address]    Report Status PENDING  Incomplete  MRSA Next Gen by PCR, Nasal     Status: None   Collection Time: 02/14/23  5:30 PM   Specimen: Nasal Mucosa; Nasal Swab  Result Value Ref Range Status   MRSA by PCR Next Gen NOT DETECTED NOT DETECTED Final    Comment: (NOTE) The GeneXpert MRSA Assay (FDA approved for NASAL specimens only), is one component of Dellene Mcgroarty comprehensive MRSA colonization surveillance program. It is not intended to diagnose MRSA infection nor to guide or monitor treatment for MRSA infections. Test performance is not FDA approved in patients less than 49 years old. Performed at [facility], [address] [place], [place] [address]      Labs: Basic Metabolic Panel: Recent Labs  Lab 02/13/23 1848 02/14/23 0500 02/15/23 0457 02/16/23 0400  NA 128* 133* 136 133*  K 4.1 3.9 3.6 3.8  CL 91* 94* 96* 94*  CO2 28 29 28 28   GLUCOSE 139* 144* 125* 149*  BUN 15 6* 7* 8  CREATININE 0.81 0.68 0.72 0.73  CALCIUM 8.5* 8.5* 8.7* 8.7*  MG  --  1.8 1.8 1.8  PHOS  --   --  4.7* 4.2   Liver Function Tests: Recent Labs  Lab 02/13/23 1848 02/14/23 0500 02/15/23 0457 02/16/23 0400  AST 22 19 20 21   ALT 21 23 23 25   ALKPHOS 60 53 60 61  BILITOT 0.4 0.4 0.5 0.4  PROT 6.7 6.3* 6.1* 6.4*  ALBUMIN 3.6 2.7* 2.7* 3.0*   No results for input(s): "LIPASE", "AMYLASE" in the last 168 hours. No results for input(s): "AMMONIA" in the last 168 hours. CBC: Recent Labs  Lab 02/13/23 1848 02/14/23 0500 02/15/23 0457 02/16/23 0400  WBC 12.8* 9.8  7.2 7.3  NEUTROABS 8.6* 6.3 4.0 3.8  HGB 12.5 12.0 12.8 13.1  HCT 37.8 36.3 38.6 39.7  MCV 92.9 92.8 91.7 91.3  PLT 296 294 332 373   Cardiac Enzymes: No results for input(s): "CKTOTAL", "CKMB", "CKMBINDEX", "TROPONINI" in the last 168 hours. BNP: BNP (last 3 results) No results for input(s): "BNP" in the last 8760 hours.  ProBNP (last 3 results) No results for input(s): "PROBNP" in the last 8760 hours.  CBG: Recent  Labs  Lab 02/15/23 0709 02/15/23 1152 02/15/23 1532 02/15/23 2123 02/16/23 0812  GLUCAP 153* 161* 101* 138* 140*       Signed:  Lacretia Nicks MD.  [facility] 02/16/2023, 8:44 AM

## 2023-02-16 NOTE — Progress Notes (Signed)
PT Cancellation Note  Patient Details Name: [name] [name] MRN: [id number] DOB: [birth year]   Cancelled Treatment:    Reason Eval/Treat Not Completed: PT screened, no needs identified, will sign off. Per OT pt independent with mobility and no PT needed.   Angelina Ok [facility] 02/16/2023, 9:30 AM Skip Mayer PT Acute Rehabilitation Services Office [phone]

## 2023-02-18 LAB — CULTURE, BLOOD (ROUTINE X 2)
Culture: NO GROWTH
Culture: NO GROWTH
Special Requests: ADEQUATE

## 2023-05-31 ENCOUNTER — Other Ambulatory Visit: Payer: Self-pay | Admitting: Student

## 2023-05-31 DIAGNOSIS — R911 Solitary pulmonary nodule: Secondary | ICD-10-CM

## 2023-05-31 DIAGNOSIS — R918 Other nonspecific abnormal finding of lung field: Secondary | ICD-10-CM

## 2023-06-03 ENCOUNTER — Encounter (HOSPITAL_BASED_OUTPATIENT_CLINIC_OR_DEPARTMENT_OTHER): Payer: Self-pay | Admitting: Pulmonary Disease

## 2023-06-11 ENCOUNTER — Ambulatory Visit
Admission: RE | Admit: 2023-06-11 | Discharge: 2023-06-11 | Disposition: A | Discharge status: Home / Self Care | Payer: Medicare HMO | Source: Ambulatory Visit | Attending: Student | Admitting: Student

## 2023-06-11 DIAGNOSIS — R918 Other nonspecific abnormal finding of lung field: Secondary | ICD-10-CM

## 2023-06-17 ENCOUNTER — Encounter: Payer: Self-pay | Admitting: Cardiovascular Disease

## 2023-06-17 ENCOUNTER — Ambulatory Visit: Payer: Medicare HMO | Attending: Cardiovascular Disease | Admitting: Cardiovascular Disease

## 2023-06-17 VITALS — BP 120/72 | HR 71 | Ht 60.0 in | Wt 120.8 lb

## 2023-06-17 DIAGNOSIS — I2089 Other forms of angina pectoris: Secondary | ICD-10-CM | POA: Diagnosis not present

## 2023-06-17 DIAGNOSIS — I10 Essential (primary) hypertension: Secondary | ICD-10-CM | POA: Diagnosis not present

## 2023-06-17 DIAGNOSIS — E782 Mixed hyperlipidemia: Secondary | ICD-10-CM

## 2023-06-17 NOTE — Progress Notes (Signed)
Cardiology Office Note:    Date:  06/17/2023   ID:  [name] [name], DOB 28-Oct-1940, MRN [id number]  PCP:  Raymon Mutton., FNP   [facility] Providers Cardiologist:  Tonny Bollman, MD Cardiology APP:  Kennon Rounds     Referring MD: Raymon Mutton., FNP   Chief Complaint  Patient presents with   Coronary Artery Disease    History of Present Illness:    [name] is a 82 y.o. 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  She had COVID about 1 month ago and is feeling fatigued since then.  She has had no chest pain, chest pressure, or shortness of breath.  She had a carotid scan earlier this year demonstrating less than 40% bilateral ICA stenosis.  She recently had a chest CT to follow-up abnormal past findings, but the result is currently pending.  Overall feels like she is doing well from a cardiac perspective.  Current Medications: Current Meds  Medication Sig   acetaminophen (TYLENOL) 325 MG tablet Take 650 mg by mouth every 6 (six) hours as needed for moderate pain.   amLODipine (NORVASC) 5 MG tablet Take 1 tablet (5 mg total) by mouth daily.   Ascorbic Acid (VITAMIN C) 500 MG CAPS Take 500 mg by mouth in the morning and at bedtime.   aspirin EC 81 MG tablet Take 81 mg by mouth every morning.    atorvastatin (LIPITOR) 80 MG  tablet TAKE ONE TABLET BY MOUTH EVERYDAY AT BEDTIME (Patient taking differently: Take 80 mg by mouth at bedtime.)   clopidogrel (PLAVIX) 75 MG tablet TAKE ONE CAPSULE BY MOUTH EVERY MORNING (Patient taking differently: Take 75 mg by mouth every morning.)   dicyclomine (BENTYL) 10 MG capsule Take 10 mg by mouth 3 (three) times daily.   ezetimibe (ZETIA) 10 MG tablet TAKE ONE TABLET BY MOUTH EVERY MORNING (Patient taking differently: Take 10 mg by mouth in the morning.)   famotidine (PEPCID) 40 MG tablet Take 40 mg by mouth 2 (two) times daily.   gabapentin (NEURONTIN) 300 MG capsule Take 300 mg by mouth 3 (three) times daily.   losartan (COZAAR) 25 MG tablet Take 1 tablet (25 mg total) by mouth daily.   montelukast (SINGULAIR) 10 MG tablet Take 10 mg by mouth at bedtime.   nitroGLYCERIN (NITROSTAT) 0.4 MG SL tablet DISSOLVE 1 TABLET UNDER THE TONGUE EVERY 5 MINUTES AS NEEDED FOR CHEST PAIN. DO NOT EXCEED A TOTAL OF 3 DOSES IN 15 MINUTES. (Patient taking differently: Place 0.4 mg under the tongue every 5 (five) minutes as needed for chest pain (DO NOT EXCEED A TOTAL OF 3 DOSES IN 15 MINUTES.).)   Omega-3 Fatty Acids (FISH OIL) 1000 MG CAPS Take 1,000 mg by mouth in the morning and at bedtime.  Cardiology Office Note:    Date:  06/17/2023   ID:  [name] [name], DOB 28-Oct-1940, MRN [id number]  PCP:  Raymon Mutton., FNP   [facility] Providers Cardiologist:  Tonny Bollman, MD Cardiology APP:  Kennon Rounds     Referring MD: Raymon Mutton., FNP   Chief Complaint  Patient presents with   Coronary Artery Disease    History of Present Illness:    [name] is a 82 y.o. 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  She had COVID about 1 month ago and is feeling fatigued since then.  She has had no chest pain, chest pressure, or shortness of breath.  She had a carotid scan earlier this year demonstrating less than 40% bilateral ICA stenosis.  She recently had a chest CT to follow-up abnormal past findings, but the result is currently pending.  Overall feels like she is doing well from a cardiac perspective.  Current Medications: Current Meds  Medication Sig   acetaminophen (TYLENOL) 325 MG tablet Take 650 mg by mouth every 6 (six) hours as needed for moderate pain.   amLODipine (NORVASC) 5 MG tablet Take 1 tablet (5 mg total) by mouth daily.   Ascorbic Acid (VITAMIN C) 500 MG CAPS Take 500 mg by mouth in the morning and at bedtime.   aspirin EC 81 MG tablet Take 81 mg by mouth every morning.    atorvastatin (LIPITOR) 80 MG  tablet TAKE ONE TABLET BY MOUTH EVERYDAY AT BEDTIME (Patient taking differently: Take 80 mg by mouth at bedtime.)   clopidogrel (PLAVIX) 75 MG tablet TAKE ONE CAPSULE BY MOUTH EVERY MORNING (Patient taking differently: Take 75 mg by mouth every morning.)   dicyclomine (BENTYL) 10 MG capsule Take 10 mg by mouth 3 (three) times daily.   ezetimibe (ZETIA) 10 MG tablet TAKE ONE TABLET BY MOUTH EVERY MORNING (Patient taking differently: Take 10 mg by mouth in the morning.)   famotidine (PEPCID) 40 MG tablet Take 40 mg by mouth 2 (two) times daily.   gabapentin (NEURONTIN) 300 MG capsule Take 300 mg by mouth 3 (three) times daily.   losartan (COZAAR) 25 MG tablet Take 1 tablet (25 mg total) by mouth daily.   montelukast (SINGULAIR) 10 MG tablet Take 10 mg by mouth at bedtime.   nitroGLYCERIN (NITROSTAT) 0.4 MG SL tablet DISSOLVE 1 TABLET UNDER THE TONGUE EVERY 5 MINUTES AS NEEDED FOR CHEST PAIN. DO NOT EXCEED A TOTAL OF 3 DOSES IN 15 MINUTES. (Patient taking differently: Place 0.4 mg under the tongue every 5 (five) minutes as needed for chest pain (DO NOT EXCEED A TOTAL OF 3 DOSES IN 15 MINUTES.).)   Omega-3 Fatty Acids (FISH OIL) 1000 MG CAPS Take 1,000 mg by mouth in the morning and at bedtime.  to Mid RCA lesion is 95% stenosed. Mid RCA lesion is 10% stenosed. The lesion was previously treated using a stent (unknown type) over 2 years ago.  Intervention  Prox RCA to Mid RCA lesion Stent Pre-stent angioplasty was performed using a BALLOON SAPPHIRE 2.0X12. A drug-eluting stent was successfully placed using a STENT RESOLUTE ONYX [id number]. Stent strut is well apposed. Post-stent angioplasty was performed using a BALLOON SAPPHIRE Raymond 3.0X12. Post-Intervention Lesion Assessment The intervention was successful. Pre-interventional TIMI flow is 3. Post-intervention TIMI flow is 3. No complications occurred at this lesion. There is a 0% residual stenosis post intervention.   STRESS TESTS  MYOCARDIAL PERFUSION IMAGING 11/12/2019  Narrative  Nuclear stress EF: 73%.  There was no ST segment deviation noted during stress.  No T wave inversion was noted during stress.  The study is normal.  This is a low risk study.  The left ventricular ejection fraction is  hyperdynamic (>65%).  Impression:  1. Normal myocardial perfusion imaging study without evidence of ischemia or infarction. 2. Normal LVEF, >65%.  Gerri Spore T. Flora Lipps, MD [facility] [address] [place], [place] [address] [phone] 3:13 PM   ECHOCARDIOGRAM  ECHOCARDIOGRAM COMPLETE 01/15/2022  Narrative ECHOCARDIOGRAM REPORT    Patient Name:   [name] [name] Date of Exam: 01/15/2022 Medical Rec #:  [id number]       Height:       59.0 in Accession #:    [id number]      Weight:       125.6 lb Date of Birth:  [birth year]       BSA:          1.513 m Patient Age:    81 years        BP:           144/78 mmHg Patient Gender: F               HR:           66 bpm. Exam Location:  [place]  Procedure: 2D Echo, Cardiac Doppler, Color Doppler and Strain Analysis  Indications:    Dyspnea R06.00  History:        Patient has prior history of Echocardiogram examinations, most recent 05/01/2019. CAD, Signs/Symptoms:Chest Pain; Risk Factors:Dyslipidemia and Hypertension.  Sonographer:    Louie Boston RDCS Referring Phys: [id number] CAITLIN S WALKER  IMPRESSIONS   1. Left ventricular ejection fraction, by estimation, is 60 to 65%. The left ventricle has normal function. The left ventricle has no regional wall motion abnormalities. There is mild concentric left ventricular hypertrophy. Left ventricular diastolic parameters are consistent with Grade I diastolic dysfunction (impaired relaxation). Elevated left ventricular end-diastolic pressure. The average left ventricular global longitudinal strain is -12.8 %. The global longitudinal strain is abnormal. 2. Right ventricular systolic function is normal. The right ventricular size is normal. There is mildly elevated pulmonary artery systolic pressure. 3. Left atrial size was mildly dilated. 4. The mitral valve is degenerative. Mild mitral valve regurgitation. No evidence of mitral stenosis. 5. The aortic valve is  tricuspid. Aortic valve regurgitation is mild. Aortic valve sclerosis/calcification is present, without any evidence of aortic stenosis. 6. Aortic Normal DTA. 7. The inferior vena cava is normal in size with greater than 50% respiratory variability, suggesting right atrial pressure of 3 mmHg.  FINDINGS Left Ventricle: Left ventricular ejection fraction, by estimation, is 60 to 65%. The left ventricle has normal function. The left ventricle has no regional wall motion  to Mid RCA lesion is 95% stenosed. Mid RCA lesion is 10% stenosed. The lesion was previously treated using a stent (unknown type) over 2 years ago.  Intervention  Prox RCA to Mid RCA lesion Stent Pre-stent angioplasty was performed using a BALLOON SAPPHIRE 2.0X12. A drug-eluting stent was successfully placed using a STENT RESOLUTE ONYX [id number]. Stent strut is well apposed. Post-stent angioplasty was performed using a BALLOON SAPPHIRE Raymond 3.0X12. Post-Intervention Lesion Assessment The intervention was successful. Pre-interventional TIMI flow is 3. Post-intervention TIMI flow is 3. No complications occurred at this lesion. There is a 0% residual stenosis post intervention.   STRESS TESTS  MYOCARDIAL PERFUSION IMAGING 11/12/2019  Narrative  Nuclear stress EF: 73%.  There was no ST segment deviation noted during stress.  No T wave inversion was noted during stress.  The study is normal.  This is a low risk study.  The left ventricular ejection fraction is  hyperdynamic (>65%).  Impression:  1. Normal myocardial perfusion imaging study without evidence of ischemia or infarction. 2. Normal LVEF, >65%.  Gerri Spore T. Flora Lipps, MD [facility] [address] [place], [place] [address] [phone] 3:13 PM   ECHOCARDIOGRAM  ECHOCARDIOGRAM COMPLETE 01/15/2022  Narrative ECHOCARDIOGRAM REPORT    Patient Name:   [name] [name] Date of Exam: 01/15/2022 Medical Rec #:  [id number]       Height:       59.0 in Accession #:    [id number]      Weight:       125.6 lb Date of Birth:  [birth year]       BSA:          1.513 m Patient Age:    81 years        BP:           144/78 mmHg Patient Gender: F               HR:           66 bpm. Exam Location:  [place]  Procedure: 2D Echo, Cardiac Doppler, Color Doppler and Strain Analysis  Indications:    Dyspnea R06.00  History:        Patient has prior history of Echocardiogram examinations, most recent 05/01/2019. CAD, Signs/Symptoms:Chest Pain; Risk Factors:Dyslipidemia and Hypertension.  Sonographer:    Louie Boston RDCS Referring Phys: [id number] CAITLIN S WALKER  IMPRESSIONS   1. Left ventricular ejection fraction, by estimation, is 60 to 65%. The left ventricle has normal function. The left ventricle has no regional wall motion abnormalities. There is mild concentric left ventricular hypertrophy. Left ventricular diastolic parameters are consistent with Grade I diastolic dysfunction (impaired relaxation). Elevated left ventricular end-diastolic pressure. The average left ventricular global longitudinal strain is -12.8 %. The global longitudinal strain is abnormal. 2. Right ventricular systolic function is normal. The right ventricular size is normal. There is mildly elevated pulmonary artery systolic pressure. 3. Left atrial size was mildly dilated. 4. The mitral valve is degenerative. Mild mitral valve regurgitation. No evidence of mitral stenosis. 5. The aortic valve is  tricuspid. Aortic valve regurgitation is mild. Aortic valve sclerosis/calcification is present, without any evidence of aortic stenosis. 6. Aortic Normal DTA. 7. The inferior vena cava is normal in size with greater than 50% respiratory variability, suggesting right atrial pressure of 3 mmHg.  FINDINGS Left Ventricle: Left ventricular ejection fraction, by estimation, is 60 to 65%. The left ventricle has normal function. The left ventricle has no regional wall motion  to Mid RCA lesion is 95% stenosed. Mid RCA lesion is 10% stenosed. The lesion was previously treated using a stent (unknown type) over 2 years ago.  Intervention  Prox RCA to Mid RCA lesion Stent Pre-stent angioplasty was performed using a BALLOON SAPPHIRE 2.0X12. A drug-eluting stent was successfully placed using a STENT RESOLUTE ONYX [id number]. Stent strut is well apposed. Post-stent angioplasty was performed using a BALLOON SAPPHIRE Raymond 3.0X12. Post-Intervention Lesion Assessment The intervention was successful. Pre-interventional TIMI flow is 3. Post-intervention TIMI flow is 3. No complications occurred at this lesion. There is a 0% residual stenosis post intervention.   STRESS TESTS  MYOCARDIAL PERFUSION IMAGING 11/12/2019  Narrative  Nuclear stress EF: 73%.  There was no ST segment deviation noted during stress.  No T wave inversion was noted during stress.  The study is normal.  This is a low risk study.  The left ventricular ejection fraction is  hyperdynamic (>65%).  Impression:  1. Normal myocardial perfusion imaging study without evidence of ischemia or infarction. 2. Normal LVEF, >65%.  Gerri Spore T. Flora Lipps, MD [facility] [address] [place], [place] [address] [phone] 3:13 PM   ECHOCARDIOGRAM  ECHOCARDIOGRAM COMPLETE 01/15/2022  Narrative ECHOCARDIOGRAM REPORT    Patient Name:   [name] [name] Date of Exam: 01/15/2022 Medical Rec #:  [id number]       Height:       59.0 in Accession #:    [id number]      Weight:       125.6 lb Date of Birth:  [birth year]       BSA:          1.513 m Patient Age:    81 years        BP:           144/78 mmHg Patient Gender: F               HR:           66 bpm. Exam Location:  [place]  Procedure: 2D Echo, Cardiac Doppler, Color Doppler and Strain Analysis  Indications:    Dyspnea R06.00  History:        Patient has prior history of Echocardiogram examinations, most recent 05/01/2019. CAD, Signs/Symptoms:Chest Pain; Risk Factors:Dyslipidemia and Hypertension.  Sonographer:    Louie Boston RDCS Referring Phys: [id number] CAITLIN S WALKER  IMPRESSIONS   1. Left ventricular ejection fraction, by estimation, is 60 to 65%. The left ventricle has normal function. The left ventricle has no regional wall motion abnormalities. There is mild concentric left ventricular hypertrophy. Left ventricular diastolic parameters are consistent with Grade I diastolic dysfunction (impaired relaxation). Elevated left ventricular end-diastolic pressure. The average left ventricular global longitudinal strain is -12.8 %. The global longitudinal strain is abnormal. 2. Right ventricular systolic function is normal. The right ventricular size is normal. There is mildly elevated pulmonary artery systolic pressure. 3. Left atrial size was mildly dilated. 4. The mitral valve is degenerative. Mild mitral valve regurgitation. No evidence of mitral stenosis. 5. The aortic valve is  tricuspid. Aortic valve regurgitation is mild. Aortic valve sclerosis/calcification is present, without any evidence of aortic stenosis. 6. Aortic Normal DTA. 7. The inferior vena cava is normal in size with greater than 50% respiratory variability, suggesting right atrial pressure of 3 mmHg.  FINDINGS Left Ventricle: Left ventricular ejection fraction, by estimation, is 60 to 65%. The left ventricle has normal function. The left ventricle has no regional wall motion  to Mid RCA lesion is 95% stenosed. Mid RCA lesion is 10% stenosed. The lesion was previously treated using a stent (unknown type) over 2 years ago.  Intervention  Prox RCA to Mid RCA lesion Stent Pre-stent angioplasty was performed using a BALLOON SAPPHIRE 2.0X12. A drug-eluting stent was successfully placed using a STENT RESOLUTE ONYX [id number]. Stent strut is well apposed. Post-stent angioplasty was performed using a BALLOON SAPPHIRE Raymond 3.0X12. Post-Intervention Lesion Assessment The intervention was successful. Pre-interventional TIMI flow is 3. Post-intervention TIMI flow is 3. No complications occurred at this lesion. There is a 0% residual stenosis post intervention.   STRESS TESTS  MYOCARDIAL PERFUSION IMAGING 11/12/2019  Narrative  Nuclear stress EF: 73%.  There was no ST segment deviation noted during stress.  No T wave inversion was noted during stress.  The study is normal.  This is a low risk study.  The left ventricular ejection fraction is  hyperdynamic (>65%).  Impression:  1. Normal myocardial perfusion imaging study without evidence of ischemia or infarction. 2. Normal LVEF, >65%.  Gerri Spore T. Flora Lipps, MD [facility] [address] [place], [place] [address] [phone] 3:13 PM   ECHOCARDIOGRAM  ECHOCARDIOGRAM COMPLETE 01/15/2022  Narrative ECHOCARDIOGRAM REPORT    Patient Name:   [name] [name] Date of Exam: 01/15/2022 Medical Rec #:  [id number]       Height:       59.0 in Accession #:    [id number]      Weight:       125.6 lb Date of Birth:  [birth year]       BSA:          1.513 m Patient Age:    81 years        BP:           144/78 mmHg Patient Gender: F               HR:           66 bpm. Exam Location:  [place]  Procedure: 2D Echo, Cardiac Doppler, Color Doppler and Strain Analysis  Indications:    Dyspnea R06.00  History:        Patient has prior history of Echocardiogram examinations, most recent 05/01/2019. CAD, Signs/Symptoms:Chest Pain; Risk Factors:Dyslipidemia and Hypertension.  Sonographer:    Louie Boston RDCS Referring Phys: [id number] CAITLIN S WALKER  IMPRESSIONS   1. Left ventricular ejection fraction, by estimation, is 60 to 65%. The left ventricle has normal function. The left ventricle has no regional wall motion abnormalities. There is mild concentric left ventricular hypertrophy. Left ventricular diastolic parameters are consistent with Grade I diastolic dysfunction (impaired relaxation). Elevated left ventricular end-diastolic pressure. The average left ventricular global longitudinal strain is -12.8 %. The global longitudinal strain is abnormal. 2. Right ventricular systolic function is normal. The right ventricular size is normal. There is mildly elevated pulmonary artery systolic pressure. 3. Left atrial size was mildly dilated. 4. The mitral valve is degenerative. Mild mitral valve regurgitation. No evidence of mitral stenosis. 5. The aortic valve is  tricuspid. Aortic valve regurgitation is mild. Aortic valve sclerosis/calcification is present, without any evidence of aortic stenosis. 6. Aortic Normal DTA. 7. The inferior vena cava is normal in size with greater than 50% respiratory variability, suggesting right atrial pressure of 3 mmHg.  FINDINGS Left Ventricle: Left ventricular ejection fraction, by estimation, is 60 to 65%. The left ventricle has normal function. The left ventricle has no regional wall motion

## 2023-06-17 NOTE — Patient Instructions (Signed)
Medication Instructions:  Your physician recommends that you continue on your current medications as directed. Please refer to the Current Medication list given to you today.  *If you need a refill on your cardiac medications before your next appointment, please call your pharmacy*  Follow-Up: At [facility], you and your health needs are our priority.  As part of our continuing mission to provide you with exceptional heart care, we have created designated Provider Care Teams.  These Care Teams include your primary Cardiologist (physician) and Advanced Practice Providers (APPs -  Physician Assistants and Nurse Practitioners) who all work together to provide you with the care you need, when you need it.  Your next appointment:   1 year(s)  Provider:   Michael Cooper, MD     

## 2023-06-17 NOTE — Assessment & Plan Note (Signed)
Patient's blood pressure is well-controlled on amlodipine, losartan, and metoprolol succinate.

## 2023-06-17 NOTE — Assessment & Plan Note (Signed)
Patient on a high intensity statin drug with atorvastatin 80 mg daily.  Last lipids with a cholesterol of 109, LDL 39.  Continue current management.

## 2023-10-08 ENCOUNTER — Other Ambulatory Visit: Payer: Self-pay | Admitting: Family

## 2023-10-08 DIAGNOSIS — W19XXXA Unspecified fall, initial encounter: Secondary | ICD-10-CM

## 2023-10-08 DIAGNOSIS — Z7901 Long term (current) use of anticoagulants: Secondary | ICD-10-CM

## 2023-10-15 ENCOUNTER — Telehealth: Payer: Self-pay | Admitting: *Deleted

## 2023-10-15 NOTE — Telephone Encounter (Signed)
   Pre-operative Risk Assessment    Patient Name: [name] [name]  DOB: 1941/04/08 MRN: [id number]   Date of last office visit: 06/17/23 Date of next office visit: N/A   Request for Surgical Clearance    Procedure:   LEFT REVERSE SHOULDER ARTHROPLASTY  Date of Surgery:  Clearance TBD                                Surgeon:  DR. Caryn Bee SUPPLE Surgeon's Group or Practice Name:  [facility] Phone number:  [phone] Fax number:  [phone]   Type of Clearance Requested:   - Pharmacy:  Hold Clopidogrel (Plavix) PLEASE INSTRUCT ON HOLDING MED   Type of Anesthesia:  General    Additional requests/questions:    [name]   10/15/2023, 8:54 AM

## 2023-10-19 ENCOUNTER — Encounter (HOSPITAL_BASED_OUTPATIENT_CLINIC_OR_DEPARTMENT_OTHER): Payer: Self-pay

## 2023-10-19 ENCOUNTER — Emergency Department (HOSPITAL_BASED_OUTPATIENT_CLINIC_OR_DEPARTMENT_OTHER)
Admission: EM | Admit: 2023-10-19 | Discharge: 2023-10-19 | Disposition: A | Discharge status: Home / Self Care | Payer: Medicare Other | Attending: Emergency Medicine | Admitting: Emergency Medicine

## 2023-10-19 ENCOUNTER — Other Ambulatory Visit: Payer: Self-pay

## 2023-10-19 DIAGNOSIS — Z7982 Long term (current) use of aspirin: Secondary | ICD-10-CM | POA: Insufficient documentation

## 2023-10-19 DIAGNOSIS — K625 Hemorrhage of anus and rectum: Secondary | ICD-10-CM | POA: Diagnosis present

## 2023-10-19 DIAGNOSIS — K921 Melena: Secondary | ICD-10-CM | POA: Diagnosis not present

## 2023-10-19 DIAGNOSIS — K529 Noninfective gastroenteritis and colitis, unspecified: Secondary | ICD-10-CM | POA: Diagnosis not present

## 2023-10-19 DIAGNOSIS — Z7901 Long term (current) use of anticoagulants: Secondary | ICD-10-CM | POA: Insufficient documentation

## 2023-10-19 LAB — COMPREHENSIVE METABOLIC PANEL
ALT: 38 U/L (ref 0–44)
AST: 16 U/L (ref 15–41)
Albumin: 4.1 g/dL (ref 3.5–5.0)
Alkaline Phosphatase: 75 U/L (ref 38–126)
Anion gap: 10 (ref 5–15)
BUN: 12 mg/dL (ref 8–23)
CO2: 30 mmol/L (ref 22–32)
Calcium: 9.1 mg/dL (ref 8.9–10.3)
Chloride: 94 mmol/L — ABNORMAL LOW (ref 98–111)
Creatinine, Ser: 0.94 mg/dL (ref 0.44–1.00)
GFR, Estimated: >60 mL/min (ref >60)
Glucose, Bld: 234 mg/dL — ABNORMAL HIGH (ref 70–99)
Potassium: 4.1 mmol/L (ref 3.5–5.1)
Sodium: 134 mmol/L — ABNORMAL LOW (ref 135–145)
Total Bilirubin: 0.6 mg/dL (ref 0.0–1.2)
Total Protein: 6.8 g/dL (ref 6.5–8.1)

## 2023-10-19 LAB — CBC
HCT: 43.3 % (ref 36.0–46.0)
Hemoglobin: 14.3 g/dL (ref 12.0–15.0)
MCH: 31.6 pg (ref 26.0–34.0)
MCHC: 33 g/dL (ref 30.0–36.0)
MCV: 95.8 fL (ref 80.0–100.0)
Platelets: 282 10*3/uL (ref 150–400)
RBC: 4.52 MIL/uL (ref 3.87–5.11)
RDW: 13.1 % (ref 11.5–15.5)
WBC: 12.9 10*3/uL — ABNORMAL HIGH (ref 4.0–10.5)
nRBC: 0 % (ref 0.0–0.2)

## 2023-10-19 NOTE — Discharge Instructions (Signed)
You were seen in the emergency room for cramping abdominal pain and bloody stool.  The workup in the emergency room is reassuring.  No evidence of found infection, electrolyte abnormality, renal failure noted.  Your hemoglobin is also stable and at normal level.  You have agreed to proceed with plan to wait and watch for now.  We recommend that you hydrate well.  Return to the ER if you start having worsening bleeding, pain, dizziness, lightheadedness, severe nausea and vomiting.

## 2023-10-19 NOTE — ED Triage Notes (Signed)
Patient arrives with complaints of bright red bloody stools x1 day. Patients family also reports that she has been vomiting, having a lack of appetite, and dizziness as well.  Rates her abdominal pain a 4/10.

## 2023-10-19 NOTE — ED Notes (Signed)
 Dc instructions reviewed with patient. Patient voiced understanding. Dc with belongings.

## 2023-10-19 NOTE — ED Provider Notes (Signed)
[place] EMERGENCY DEPARTMENT AT [facility] Provider Note   CSN: [id number] Arrival date & time: 10/19/23  1154     History  Chief Complaint  Patient presents with   Abdominal Pain   Rectal Bleeding    [name] [name] is a 83 y.o. female.  HPI    83 year old female comes in with chief complaint of abdominal pain and rectal bleeding.  Patient indicates that she started having some cramping abdominal pain yesterday.  Subsequently she has had bloody stool on 1 occasion.  Patient has constant, lower quadrant pain.  The pain is 4 out of 10.  She has past flatus.  She denies any current nausea, but did have emesis on 1 occasion.  Patient has history of diverticulosis, ischemic colitis, pancreatitis and IBS and is status post appendectomy and cholecystectomy.  Son is at the bedside.  Patient lives by herself, but family is nearby.  Home Medications Prior to Admission medications   Medication Sig Start Date End Date Taking? Authorizing Provider  acetaminophen (TYLENOL) 325 MG tablet Take 650 mg by mouth every 6 (six) hours as needed for moderate pain.    [provider]  amLODipine (NORVASC) 5 MG tablet Take 1 tablet (5 mg total) by mouth daily. 11/07/21   Myrlene Broker, MD  Ascorbic Acid (VITAMIN C) 500 MG CAPS Take 500 mg by mouth in the morning and at bedtime.    [provider]  aspirin EC 81 MG tablet Take 81 mg by mouth every morning.     [provider]  atorvastatin (LIPITOR) 80 MG tablet TAKE ONE TABLET BY MOUTH EVERYDAY AT BEDTIME Patient taking differently: Take 80 mg by mouth at bedtime. 08/06/22   Etta Grandchild, MD  clopidogrel (PLAVIX) 75 MG tablet TAKE ONE CAPSULE BY MOUTH EVERY MORNING Patient taking differently: Take 75 mg by mouth every morning. 08/06/22   Etta Grandchild, MD  dicyclomine (BENTYL) 10 MG capsule Take 10 mg by mouth 3 (three) times daily.    [provider]  ezetimibe (ZETIA) 10 MG tablet TAKE ONE  TABLET BY MOUTH EVERY MORNING Patient taking differently: Take 10 mg by mouth in the morning. 08/06/22   Etta Grandchild, MD  famotidine (PEPCID) 40 MG tablet Take 40 mg by mouth 2 (two) times daily. 12/06/22   [provider]  gabapentin (NEURONTIN) 300 MG capsule Take 300 mg by mouth 3 (three) times daily.    [provider]  losartan (COZAAR) 25 MG tablet Take 1 tablet (25 mg total) by mouth daily. 02/12/22   Alver Sorrow, NP  metoprolol succinate (TOPROL-XL) 25 MG 24 hr tablet Take 3 tablets (75 mg total) by mouth every morning. 09/30/22 02/14/23  Berton Mount I, MD  montelukast (SINGULAIR) 10 MG tablet Take 10 mg by mouth at bedtime. 02/07/23   [provider]  nitroGLYCERIN (NITROSTAT) 0.4 MG SL tablet DISSOLVE 1 TABLET UNDER THE TONGUE EVERY 5 MINUTES AS NEEDED FOR CHEST PAIN. DO NOT EXCEED A TOTAL OF 3 DOSES IN 15 MINUTES. Patient taking differently: Place 0.4 mg under the tongue every 5 (five) minutes as needed for chest pain (DO NOT EXCEED A TOTAL OF 3 DOSES IN 15 MINUTES.). 11/29/21   Etta Grandchild, MD  Omega-3 Fatty Acids (FISH OIL) 1000 MG CAPS Take 1,000 mg by mouth in the morning and at bedtime.    [provider]  ondansetron (ZOFRAN) 4 MG tablet Take 4 mg by mouth every 8 (eight) hours  as needed for vomiting or nausea. 02/07/23   [provider]  pantoprazole (PROTONIX) 40 MG tablet Take 40 mg by mouth 2 (two) times daily.    [provider]  Psyllium (DAILY FIBER PO) Take 1 capsule by mouth daily.    [provider]  venlafaxine XR (EFFEXOR-XR) 37.5 MG 24 hr capsule Take 37.5 mg by mouth every morning. 02/07/23   [provider]      Allergies    Macrobid [nitrofurantoin], Sulfa antibiotics, Niacin and related, Codeine, Prednisone, Rocephin [ceftriaxone], Sulfonamide derivatives, and Tetracycline    Review of Systems   Review of Systems  All other systems reviewed and are negative.   Physical  Exam Updated Vital Signs BP 135/63   Pulse 67   Temp 97.9 F (36.6 C) (Oral)   Resp 19   Ht 5' (1.524 m)   Wt 55 kg   SpO2 95%   BMI 23.68 kg/m  Physical Exam Vitals and nursing note reviewed.  Constitutional:      Appearance: She is well-developed.  HENT:     Head: Atraumatic.  Cardiovascular:     Rate and Rhythm: Normal rate.  Pulmonary:     Effort: Pulmonary effort is normal.  Abdominal:     Tenderness: There is abdominal tenderness in the right lower quadrant, suprapubic area and left lower quadrant. There is no guarding or rebound.  Musculoskeletal:     Cervical back: Normal range of motion and neck supple.  Skin:    General: Skin is warm and dry.  Neurological:     Mental Status: She is alert and oriented to person, place, and time.     ED Results / Procedures / Treatments   Labs (all labs ordered are listed, but only abnormal results are displayed) Labs Reviewed  COMPREHENSIVE METABOLIC PANEL - Abnormal; Notable for the following components:      Result Value   Sodium 134 (*)    Chloride 94 (*)    Glucose, Bld 234 (*)    All other components within normal limits  CBC - Abnormal; Notable for the following components:   WBC 12.9 (*)    All other components within normal limits  POC OCCULT BLOOD, ED    EKG None  Radiology No results found.  Procedures Procedures    Medications Ordered in ED Medications - No data to display  ED Course/ Medical Decision Making/ A&P                                 Medical Decision Making Amount and/or Complexity of Data Reviewed Labs: ordered.  83 year old female comes in with chief complaint of lower quadrant abdominal pain along with an episode of bloody stool.  Patient has previous history of appendectomy, cholecystectomy and has history of IBS, metabolic syndrome, CAD and documented ischemic colitis, but patient does not recollect that.  I reviewed patient's records including previous colonoscopy from 2017,  2014 and also CT scan from 2022.  Patient's abdominal exam is reassuring.  She has lower quadrant abdominal tenderness, without any mass or palpable nodule.  Differential diagnosis considered for this patient includes: Diverticular bleed Diverticulitis Colon cancer Rectal bleed Internal hemorrhoids External hemorrhoids Colitis AAA  Basic labs were ordered from triage. Patient has white count of 12.9.  Electrolytes are reassuring.  No evidence of AKI, elevated BUN.  Patient's hemoglobin is at 14.3.  Discussed with the patient that my suspicion is  high for colitis, but that she does have previous abdominal surgical history and also documented GI pathology.  Most likely, if her condition is due to one of the known GI pathology, then the management is still conservative in nature.  I discussed with the patient and the son, aggressive option of getting a CT scan in the ER versus wait and watch approach.  Patient and the family are comfortable with wait and watch approach for now.  Patient is supposed to follow-up with her PCP on Monday anyways.  I discussed with them that if Ms. Brigitt starts having fevers, worsening bloody stools, worsening abdominal pain, severe nausea or vomiting -then she must return to the ER immediately.  Final Clinical Impression(s) / ED Diagnoses Final diagnoses:  Colitis  Hematochezia    Rx / DC Orders ED Discharge Orders     None         Derwood Kaplan, MD 10/19/23 1526

## 2023-10-19 NOTE — ED Notes (Signed)
Her son has just informed me that pt. Had had a recent fall (within two weeks) at which time she struck her left face and as a result of that developed a left-sided "black eye" and has been "dizzy". He further tells me that she was to have a head CT, however, that was delayed in lieu of the need to do holter monitoring for a pre-op situation. He tells me that the heart monitoring was finished yesterday. He also tells me that pt. Has a "knot on her left shin".

## 2023-10-21 ENCOUNTER — Telehealth: Payer: Self-pay

## 2023-10-21 NOTE — Telephone Encounter (Signed)
Patient has been scheduled for telephone appt

## 2023-10-21 NOTE — Telephone Encounter (Signed)
Patient has been scheduled for telephone appt and consent done     Patient Consent for Virtual Visit         [name] has provided verbal consent on 10/21/2023 for a virtual visit (video or telephone).   CONSENT FOR VIRTUAL VISIT FOR:  [name] [name]  By participating in this virtual visit I agree to the following:  I hereby voluntarily request, consent and authorize [facility] and its employed or contracted physicians, physician assistants, nurse practitioners or other licensed health care professionals (the Practitioner), to provide me with telemedicine health care services (the "Services") as deemed necessary by the treating Practitioner. I acknowledge and consent to receive the Services by the Practitioner via telemedicine. I understand that the telemedicine visit will involve communicating with the Practitioner through live audiovisual communication technology and the disclosure of certain medical information by electronic transmission. I acknowledge that I have been given the opportunity to request an in-person assessment or other available alternative prior to the telemedicine visit and am voluntarily participating in the telemedicine visit.  I understand that I have the right to withhold or withdraw my consent to the use of telemedicine in the course of my care at any time, without affecting my right to future care or treatment, and that the Practitioner or I may terminate the telemedicine visit at any time. I understand that I have the right to inspect all information obtained and/or recorded in the course of the telemedicine visit and may receive copies of available information for a reasonable fee.  I understand that some of the potential risks of receiving the Services via telemedicine include:  Delay or interruption in medical evaluation due to technological equipment failure or disruption; Information transmitted may not be sufficient (e.g. poor resolution of images)  to allow for appropriate medical decision making by the Practitioner; and/or  In rare instances, security protocols could fail, causing a breach of personal health information.  Furthermore, I acknowledge that it is my responsibility to provide information about my medical history, conditions and care that is complete and accurate to the best of my ability. I acknowledge that Practitioner's advice, recommendations, and/or decision may be based on factors not within their control, such as incomplete or inaccurate data provided by me or distortions of diagnostic images or specimens that may result from electronic transmissions. I understand that the practice of medicine is not an exact science and that Practitioner makes no warranties or guarantees regarding treatment outcomes. I acknowledge that a copy of this consent can be made available to me via my patient portal [facility] MyChart), or I can request a printed copy by calling the office of [facility].    I understand that my insurance will be billed for this visit.   I have read or had this consent read to me. I understand the contents of this consent, which adequately explains the benefits and risks of the Services being provided via telemedicine.  I have been provided ample opportunity to ask questions regarding this consent and the Services and have had my questions answered to my satisfaction. I give my informed consent for the services to be provided through the use of telemedicine in my medical care

## 2023-10-21 NOTE — Telephone Encounter (Signed)
   Name: [name] [name]  DOB: [birth year]  MRN: [id number]  Primary Cardiologist: Tonny Bollman, MD   Preoperative team, please contact this patient and set up a phone call appointment for further preoperative risk assessment. Please obtain consent and complete medication review. Thank you for your help.  I confirm that guidance regarding antiplatelet and oral anticoagulation therapy has been completed and, if necessary, noted below.  Per Dr. Excell Seltzer, "Pt at low-risk of holding clopidogrel 5 days before surgery. OK to proceed with that plan."  I also confirmed the patient resides in the state of [place]. As per [facility] Medical Board telemedicine laws, the patient must reside in the state in which the provider is licensed.   Carlos Levering, NP 10/21/2023, 3:30 PM [facility]

## 2023-10-21 NOTE — Telephone Encounter (Signed)
Pt at low-risk of holding clopidogrel 5 days before surgery. OK to proceed with that plan. Thanks

## 2023-10-22 ENCOUNTER — Ambulatory Visit
Admission: RE | Admit: 2023-10-22 | Discharge: 2023-10-22 | Disposition: A | Discharge status: Home / Self Care | Payer: Medicare Other | Source: Ambulatory Visit | Attending: Family | Admitting: Family

## 2023-10-22 ENCOUNTER — Other Ambulatory Visit: Payer: Self-pay | Admitting: Family

## 2023-10-22 ENCOUNTER — Telehealth: Payer: Self-pay

## 2023-10-22 DIAGNOSIS — W19XXXA Unspecified fall, initial encounter: Secondary | ICD-10-CM

## 2023-10-22 DIAGNOSIS — Z7901 Long term (current) use of anticoagulants: Secondary | ICD-10-CM

## 2023-10-22 DIAGNOSIS — M79605 Pain in left leg: Secondary | ICD-10-CM

## 2023-10-22 NOTE — Telephone Encounter (Signed)
-----   Message from Carie Caddy Pyrtle sent at 10/22/2023 12:16 PM EST ----- Regarding: RE: follow up Thanks for the message [name] [name] Pt will see PCP, but needs GI followup too with me to app JMP ----- Message ----- From: Derwood Kaplan, MD Sent: 10/19/2023   3:23 PM EST To: Beverley Fiedler, MD Subject: follow up                                      Hello Dr. Rhea Belton,  I saw [name] [name] in the emergency room today for bloody stool.  She had some cramping abdominal pain as well.  She is established with you, and has documented history of diverticulosis, internal hemorrhoids and possible ischemic colitis.  Clinically, it appeared today that she has colitis.  She was clinically stable and opted not to get CT scan, as she was supposed to get PCP follow-up on Monday. However given her age and history, I was wondering if someone from your service can reach out to her and ensure close follow-up is provided if she is not improving.  Thank you for your help,  [name] [name]

## 2023-10-22 NOTE — Telephone Encounter (Signed)
Pt scheduled to see Alcide Evener NP 11/01/23 at 1:30pm. Left detailed message regarding appt on pts voicemail.

## 2023-10-23 NOTE — Telephone Encounter (Signed)
Patient returned call, made patient aware of appointment.

## 2023-11-01 ENCOUNTER — Encounter: Payer: Self-pay | Admitting: Nurse Practitioner

## 2023-11-01 ENCOUNTER — Ambulatory Visit: Payer: Medicare Other | Admitting: Nurse Practitioner

## 2023-11-01 ENCOUNTER — Other Ambulatory Visit: Payer: Medicare Other

## 2023-11-01 VITALS — BP 124/64 | HR 79 | Ht 60.0 in | Wt 124.5 lb

## 2023-11-01 DIAGNOSIS — K529 Noninfective gastroenteritis and colitis, unspecified: Secondary | ICD-10-CM

## 2023-11-01 DIAGNOSIS — Z860101 Personal history of adenomatous and serrated colon polyps: Secondary | ICD-10-CM | POA: Diagnosis not present

## 2023-11-01 DIAGNOSIS — K219 Gastro-esophageal reflux disease without esophagitis: Secondary | ICD-10-CM | POA: Diagnosis not present

## 2023-11-01 DIAGNOSIS — R1030 Lower abdominal pain, unspecified: Secondary | ICD-10-CM

## 2023-11-01 LAB — BASIC METABOLIC PANEL
BUN: 14 mg/dL (ref 6–23)
CO2: 32 meq/L (ref 19–32)
Calcium: 8.6 mg/dL (ref 8.4–10.5)
Chloride: 100 meq/L (ref 96–112)
Creatinine, Ser: 0.85 mg/dL (ref 0.40–1.20)
GFR: 63.1 mL/min (ref >60.00)
Glucose, Bld: 180 mg/dL — ABNORMAL HIGH (ref 70–99)
Potassium: 4.3 meq/L (ref 3.5–5.1)
Sodium: 138 meq/L (ref 135–145)

## 2023-11-01 LAB — CBC WITH DIFFERENTIAL/PLATELET
Basophils Absolute: 0.1 10*3/uL (ref 0.0–0.1)
Basophils Relative: 0.6 % (ref 0.0–3.0)
Eosinophils Absolute: 0.1 10*3/uL (ref 0.0–0.7)
Eosinophils Relative: 0.8 % (ref 0.0–5.0)
HCT: 40.7 % (ref 36.0–46.0)
Hemoglobin: 13.6 g/dL (ref 12.0–15.0)
Lymphocytes Relative: 27.2 % (ref 12.0–46.0)
Lymphs Abs: 2.6 10*3/uL (ref 0.7–4.0)
MCHC: 33.4 g/dL (ref 30.0–36.0)
MCV: 94.5 fL (ref 78.0–100.0)
Monocytes Absolute: 0.8 10*3/uL (ref 0.1–1.0)
Monocytes Relative: 8.5 % (ref 3.0–12.0)
Neutro Abs: 6.1 10*3/uL (ref 1.4–7.7)
Neutrophils Relative %: 62.9 % (ref 43.0–77.0)
Platelets: 330 10*3/uL (ref 150.0–400.0)
RBC: 4.3 Mil/uL (ref 3.87–5.11)
RDW: 13.9 % (ref 11.5–15.5)
WBC: 9.7 10*3/uL (ref 4.0–10.5)

## 2023-11-01 LAB — C-REACTIVE PROTEIN: CRP: <1 mg/dL (ref 0.5–20.0)

## 2023-11-01 NOTE — Patient Instructions (Signed)
 Your provider has requested that you go to the basement level for lab work before leaving today. Press "B" on the elevator. The lab is located at the first door on the left as you exit the elevator.  You have been scheduled for a CT scan of the abdomen and pelvis at [facility], 1st floor Radiology. You are scheduled on 11/08/23 at 9:00 am. You should arrive 30  minutes prior to your appointment time for registration  If you have any questions regarding your exam or if you need to reschedule, you may call [facility] at [phone] between the hours of 8:00 am and 5:00 pm, Monday-Friday.   Go to the emergency department if you develop severe abdominal pain or if you pass a large amount of blood from the rectum.  IBGuard- take 1 by mouth twice daily as needed for abdominal pain  Due to recent changes in healthcare laws, you may see the results of your imaging and laboratory studies on MyChart before your provider has had a chance to review them.  We understand that in some cases there may be results that are confusing or concerning to you. Not all laboratory results come back in the same time frame and the provider may be waiting for multiple results in order to interpret others.  Please give [place] 48 hours in order for your provider to thoroughly review all the results before contacting the office for clarification of your results.   Thank you for trusting me with your gastrointestinal care!   Alcide Evener, CRNP

## 2023-11-01 NOTE — Progress Notes (Signed)
 11/01/2023 [name] [name] [id number] 1940/11/07   Chief Complaint: Abdominal pain , blood per the rectum   History of Present Illness: [name] [name]. Roets is an 83 year old female with a past medical history of anxiety, CAD s/p MI 2007, s/p DES 2007, 2018, 2021 and 2022, "mini CVA", diabetes mellitus type 2, GERD, ischemic colitis per colonoscopy 12/2012, colon polyps and internal hemorrhoids with bleeding and prolapse s/p hemorrhoidal banding by Dr. Rhea Belton March, September and November 2022. Past appendectomy and cholecystectomy. She presents today for further evaluation regarding abdominal pain. She developed epigastric pain around 10/05/2023 which progressively worsened and progressed to the central lower abdomen. She awakened in the am on 10/05/2023 with a headache then developed generalized abdominal cramping 1 or 2 hours after eating lunch which consisted of home cooked pork chop and broccoli. She vomited up the food she ate for lunch x 2 episodes and started passing nonbloody diarrhea x 8 episodes. She took Imodium, the diarrhea stopped but she continued to have lower abdominal cramping and passed a moderate to large amount of bright red blood per the rectum without stool x 4 episodes. She stated the blood per the rectum was unlike any hemorrhoidal bleeding she's experienced in the past. She is on ASA and Plavix. She went to the ED 2/15 for further evaluation. Labs in the ED showed a WBC count of 12.9. Hg 14.3. An abdominal/pelvic CT was considered, patient preferred wait and watch approach and she was discharged home. She stated her abdominal pain initially improved but has increased over the past few days and was worse last night for a few hours and awakened this morning without any abdominal pain. No further hematochezia since 10/19/2023. She is passing normal brown formed stools without any further diarrhea. No GERD symptoms on Pantoprazole 40mg  bid. She as recently diagnosed with a UTI, prescribed a  7 day course of Cipro which she will finish on 3/2. Her most recent colonoscopy was 06/28/2016 which identified one tubular adenomatous polyp removed from the colo and moderate diverticulosis to the sigmoid and descending colon.      Latest Ref Rng & Units 10/19/2023   11:59 AM 02/16/2023    4:00 AM 02/15/2023    4:57 AM  CBC  WBC 4.0 - 10.5 K/uL 12.9  7.3  7.2   Hemoglobin 12.0 - 15.0 g/dL 40.9  81.1  91.4   Hematocrit 36.0 - 46.0 % 43.3  39.7  38.6   Platelets 150 - 400 K/uL 282  373  332         Latest Ref Rng & Units 10/19/2023   11:59 AM 02/16/2023    4:00 AM 02/15/2023    4:57 AM  CMP  Glucose 70 - 99 mg/dL 782  956  213   BUN 8 - 23 mg/dL 12  8  7    Creatinine 0.44 - 1.00 mg/dL 0.86  5.78  4.69   Sodium 135 - 145 mmol/L 134  133  136   Potassium 3.5 - 5.1 mmol/L 4.1  3.8  3.6   Chloride 98 - 111 mmol/L 94  94  96   CO2 22 - 32 mmol/L 30  28  28    Calcium 8.9 - 10.3 mg/dL 9.1  8.7  8.7   Total Protein 6.5 - 8.1 g/dL 6.8  6.4  6.1   Total Bilirubin 0.0 - 1.2 mg/dL 0.6  0.4  0.5   Alkaline Phos 38 - 126 U/L 75  61  60  AST 15 - 41 U/L 16  21  20    ALT 0 - 44 U/L 38  25  23     Colonoscopy 06/28/2016: - The examined portion of the ileum was normal.  - One 3 mm polyp in the descending colon, removed with a cold biopsy forceps. Resected and retrieved. - Moderate diverticulosis in the sigmoid colon and in the descending colon.  - Normal mucosa in the entire examined colon. Biopsied.  - Internal hemorrhoids.  1. Surgical [P], random sites - BENIGN COLONIC MUCOSA. NO COLITIS, GRANULOMATA OR ADENOMATOUS EPITHELIUM IDENTIFIED. 2. Surgical [P], descending - TUBULAR ADENOMA. NO HIGH GRADE DYSPLASIA OR INVASIVE MALIGNANCY IDENTIFIED.  ECHO 05/01/2019: 1. The left ventricle has normal systolic function with an ejection fraction of 60-65%. The cavity size was normal. Moderate Basal septal hypertrophy. Left ventricular diastolic Doppler parameters are consistent with pseudonormalization.  No evidence of left ventricular regional wall motion abnormalities. 2. The right ventricle has normal systolic function. The cavity was normal. There is no increase in right ventricular wall thickness. 3. Left atrial size was mildly dilated. 4. The mitral valve is degenerative. Moderate thickening of the anterior mitral valve leaflet. Mild calcification of the anterior mitral valve leaflet. There is moderate mitral annular calcification present. There is trivial MR. 5. The aortic valve is tricuspid. Moderate sclerosis of the aortic valve and moderate aortic annular calcification. 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 Aortic valve regurgitation is mild by color flow Doppler. 6. The aorta is normal unless otherwise noted   Past Medical History:  Diagnosis Date   Allergy    Anal fissure    Anxiety    CAD (coronary artery disease)    s/p inf MI 2007 - tx w/ DES to RCA // s/p POBA to [facility] and DES to dRCA in 11/18 ([facility] in [place], [place]) // Myoview 3/21: low risk // s/p DES to pRCA   Cataract    removed both eyes   Cerebrovascular disease    Carotid US 09/2017 [facility]): mild plaque (<50%) in both carotid arteries   Chronic neck pain    Chronic pain syndrome    Diabetes Mellitus, Type 2    Diverticular disease    DJD (degenerative joint disease)    Echocardiogram 10/2018    Echo 10/2018: EF 55-60, normal RVSF, mod MAC, mod TR, severe AoV calcification and sclerosis with nodular calcium/mobile area of calcium in the LVOT (small veg vs Lambl's excrescence  - consider TEE), mild AI, mild AS (mean 11).    Echocardiogram 04/2019    Echocardiogram 04/2019: EF 60-65, basal septal hypertrophy, grade 2 diastolic dysfunction, normal wall motion, normal RV SF, mild LAE, mod MAC, trivial MR, mod sclerosis of the aortic valve with mod aortic annular calcification, thin mobile filamentous structure on ventricular side of AV  likely representing Lambl's excrescence, mild AI, mild TR   Frequent UTI's    Gastroesophageal reflux disease    H/O bacterial endocarditis    Rx w IV Abxs in 2020 ([facility] in [place], [place]) // F/u echo with AoV Lambl's excrescence   H/O hiatal hernia    HTN (hypertension)    Hx of fall 10/2020   Hx of MI 2007   Hx of Stroke    IBS (irritable bowel syndrome)    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 (Cervical Fusion)   Premature ventricular contractions    Tubular adenoma of colon    Ulcer    [facility]   Urinary incontinence    Past Surgical History:  Procedure Laterality Date   APPENDECTOMY  1955   BLADDER REPAIR  2007   BRONCHIAL BIOPSY  05/17/2022   Procedure: BRONCHIAL BIOPSIES;  Surgeon: Omar Person, MD;  Location[facility] ENDOSCOPY;  Service: Pulmonary;;   BRONCHIAL BRUSHINGS  05/17/2022   Procedure: BRONCHIAL BRUSHINGS;  Surgeon: Omar Person, MD;  Location: [facility] ENDOSCOPY;  Service: Pulmonary;;   BRONCHIAL NEEDLE ASPIRATION BIOPSY  05/17/2022   Procedure: BRONCHIAL NEEDLE ASPIRATION BIOPSIES;  Surgeon: Omar Person, MD;  Location: [facility] ENDOSCOPY;  Service: Pulmonary;;   CARDIAC CATHETERIZATION  2007   Stents   CERVICAL SPINE SURGERY  07/2005   Dr Annakate East   CHOLECYSTECTOMY  2007   COLONOSCOPY     CORONARY PRESSURE/FFR WITH 3D MAPPING N/A 06/28/2021   Procedure: Coronary Pressure Wire/FFR w/3D Mapping;  Surgeon: Kathleene Hazel, MD;  Location: [facility] INVASIVE CV LAB;  Service: Cardiovascular;  Laterality: N/A;   CORONARY STENT INTERVENTION N/A 03/09/2020   Procedure: CORONARY STENT INTERVENTION;  Surgeon: Kathleene Hazel, MD;  Location: [facility] INVASIVE CV LAB;   Service: Cardiovascular;  Laterality: N/A;   CORONARY STENT INTERVENTION N/A 06/28/2021   Procedure: CORONARY STENT INTERVENTION;  Surgeon: Kathleene Hazel, MD;  Location: [facility] INVASIVE CV LAB;  Service: Cardiovascular;  Laterality: N/A;   DENTAL SURGERY  05/2013   replaced an inplant   EYE SURGERY  2011   Bilteral   FIDUCIAL MARKER PLACEMENT  05/17/2022   Procedure: FIDUCIAL MARKER PLACEMENT;  Surgeon: Omar Person, MD;  Location: [facility] ENDOSCOPY;  Service: Pulmonary;;  LUL   I & D KNEE WITH POLY EXCHANGE  09/25/2011   Procedure: IRRIGATION AND DEBRIDEMENT KNEE WITH POLY EXCHANGE;  Surgeon: Eulas Post, MD;  Location: [facility] OR;  Service: Orthopedics;  Laterality: Left;   INCISION AND DRAINAGE ABSCESS / HEMATOMA OF BURSA / KNEE / THIGH  09/2011   JOINT REPLACEMENT  2012   left   LEFT HEART CATH AND CORONARY ANGIOGRAPHY N/A 03/09/2020   Procedure: LEFT HEART CATH AND CORONARY ANGIOGRAPHY;  Surgeon: Kathleene Hazel, MD;  Location: [facility] INVASIVE CV LAB;  Service: Cardiovascular;  Laterality: N/A;   LEFT HEART CATH AND CORONARY ANGIOGRAPHY N/A 06/28/2021   Procedure: LEFT HEART CATH AND CORONARY ANGIOGRAPHY;  Surgeon: Kathleene Hazel, MD;  Location: [facility] INVASIVE CV LAB;  Service: Cardiovascular;  Laterality: N/A;   left Total Knee Replacement  11/2010   Dr Eulah Pont   NECK SURGERY     has had 3 surgeries   POLYPECTOMY     POSTERIOR CERVICAL FUSION/FORAMINOTOMY  04/08/2012   Procedure: POSTERIOR CERVICAL FUSION/FORAMINOTOMY LEVEL 2;  Surgeon: Clydene Fake, MD;  Location: [facility] NEURO ORS;  Service: Neurosurgery;  Laterality: Left;  Left Cervical six-seven Foraminotomy, bilateral cervical seven-thoracic one foraminotomy, cervical six-seven, cervical seven-thoracic one fusion with posterior instrumentation   TEMPOROMANDIBULAR JOINT SURGERY     thumb surgery     TONSILLECTOMY     TONSILLECTOMY  1950   TOTAL ABDOMINAL HYSTERECTOMY     VIDEO BRONCHOSCOPY WITH RADIAL ENDOBRONCHIAL  ULTRASOUND  05/17/2022   Procedure: VIDEO BRONCHOSCOPY WITH RADIAL ENDOBRONCHIAL ULTRASOUND;  Surgeon: Omar Person, MD;  Location: [facility] ENDOSCOPY;  Service: Pulmonary;;   Current Outpatient Medications on File Prior to Visit  Medication Sig Dispense Refill   acetaminophen (TYLENOL) 325 MG tablet Take 650 mg by mouth every 6 (six) hours as needed for moderate pain.     amLODipine (NORVASC) 5 MG tablet Take 1 tablet (5 mg total) by mouth daily. 90 tablet 3   Ascorbic Acid (VITAMIN C) 500 MG CAPS Take 500 mg by mouth in the morning and at bedtime.     aspirin EC 81 MG tablet Take 81 mg by mouth every morning.      atorvastatin (LIPITOR) 80 MG tablet TAKE ONE TABLET BY MOUTH EVERYDAY AT BEDTIME 90 tablet 0   clopidogrel (PLAVIX) 75 MG tablet TAKE ONE CAPSULE BY MOUTH EVERY MORNING 90 tablet 0   dicyclomine (BENTYL) 10 MG capsule Take 10 mg by mouth 3 (three) times daily.     ezetimibe (ZETIA) 10 MG tablet TAKE ONE TABLET BY MOUTH EVERY MORNING 90 tablet 0   famotidine (PEPCID) 40 MG tablet Take 40 mg by mouth 2 (two) times daily.     gabapentin (NEURONTIN) 300 MG capsule Take 300 mg by mouth 3 (three) times daily.     losartan (COZAAR) 25 MG tablet Take 1 tablet (25 mg total) by mouth daily. 90 tablet 3   montelukast (SINGULAIR) 10 MG tablet Take 10 mg by mouth at bedtime.     nitroGLYCERIN (NITROSTAT) 0.4 MG SL tablet DISSOLVE 1 TABLET UNDER THE TONGUE EVERY 5 MINUTES AS NEEDED FOR CHEST PAIN. DO NOT EXCEED A TOTAL OF 3 DOSES IN 15 MINUTES. 25 tablet 2   Omega-3 Fatty Acids (FISH OIL) 1000 MG CAPS Take 1,000 mg by mouth in the morning and at bedtime.     ondansetron (ZOFRAN) 4 MG tablet Take 4 mg by mouth every 8 (eight) hours as needed for vomiting or nausea.     pantoprazole (PROTONIX) 40 MG tablet Take 40 mg by mouth 2 (two) times daily.     Psyllium (DAILY FIBER PO) Take 1 capsule by mouth daily.     venlafaxine XR (EFFEXOR-XR) 37.5 MG 24 hr capsule Take 37.5 mg by mouth every morning.      metoprolol succinate (TOPROL-XL) 25 MG 24 hr tablet Take 3 tablets (75 mg total) by mouth every morning. 90 tablet 0   No current facility-administered medications on file prior to visit.   Allergies  Allergen Reactions   Macrobid [Nitrofurantoin] Nausea Only    Severe nausea   Sulfa Antibiotics Itching   Niacin And Related Other (See Comments)    Must take "Flush-free"   Codeine Nausea Only   Prednisone Itching and Rash   Rocephin [Ceftriaxone] Itching   Sulfonamide Derivatives Itching   Tetracycline Itching and Rash   Current Medications, Allergies, Past Medical History, Past Surgical History, Family History and Social History were reviewed in [employer] record.  Review of Systems:   Constitutional: Negative for fever, sweats, chills or weight loss.  Respiratory: Negative for shortness of breath.   Cardiovascular: Negative for chest pain, palpitations and leg swelling.  Gastrointestinal: See HPI.  Musculoskeletal: Negative for back pain or muscle aches.  Neurological: Negative for dizziness, headaches or paresthesias.   Physical Exam: BP 124/64   Pulse 79   Ht 5' (1.524 m)   Wt 124 lb 8 oz (56.5 kg)   SpO2 95%  BMI 24.31 kg/m   Wt Readings from Last 3 Encounters:  11/01/23 124 lb 8 oz (56.5 kg)  10/19/23 121 lb 4.1 oz (55 kg)  06/17/23 120 lb 12.8 oz (54.8 kg)    General: 83 year old female in no acute distress. Head: Normocephalic and atraumatic. Eyes: No scleral icterus. Conjunctiva pink . Ears: Normal auditory acuity. Mouth: Dentition intact. No ulcers or lesions.  Lungs: Clear throughout to auscultation. Heart: Regular rate and rhythm. Soft systolic murmur.  Abdomen: Soft, mild distension. Mild tenderness to the supraumbilical area and throughout the lower abdomen > to the central lower abdomen without rebound or guarding. No masses or hepatomegaly. Normal bowel sounds x 4 quadrants.  Rectal: Deferred.  Musculoskeletal: Symmetrical  with no gross deformities. Extremities: No edema. Neurological: Alert oriented x 4. No focal deficits.  Psychological: Alert and cooperative. Normal mood and affect  Assessment and Recommendations:  83 year old female with a history of diverticulosis and ischemic colitis with generalized to lower abdominal pain with N/V/D followed by hematochezia x 4 episodes on 10/19/2023. Labs in the ED showed WBC 12.9 and Hg14.3. On Plavix and ASA.  Likely due to infectious gastroenteritis. Lower abdominal pain persists without further N/V or hematochezia. Moderate supraumbilical and central lower abdominal tenderness on exam.  -CBC, BMP and CRP -CTAP with oral and IV contrast. Bun/Cr level to be reviewed prior to the patient receiving IV contrast. -Patient instructed to go to the ED if her abdominal pain worsens or if she passes moderate to large volume blood per the rectum -Ibgard one po bid PRN abdominal pain  -Further recommendations to be determined after the above evaluation completed   GERD, patient had epigastric pain 2/1 x 1 to 2 weeks -Continue Pantoprazole 40mg  po bid  -Labs and CT as ordered above   History of colon polyps. Her most recent colonoscopy in 2017 identified one TA polyp removed from the colon. -No further colonoscopies recommended due to age   CAD s/p MI, s/p DES x 4 on ASA and Plavix. No angina.

## 2023-11-04 ENCOUNTER — Ambulatory Visit: Payer: Medicare Other | Attending: Cardiology | Admitting: Nurse Practitioner

## 2023-11-04 DIAGNOSIS — Z01810 Encounter for preprocedural cardiovascular examination: Secondary | ICD-10-CM

## 2023-11-04 NOTE — Progress Notes (Signed)
 Virtual Visit via Telephone Note   Because of JAILEE JAQUEZ co-morbid illnesses, she is at least at moderate risk for complications without adequate follow up.  This format is felt to be most appropriate for this patient at this time.  Due to technical limitations with video connection Web designer), today's appointment will be conducted as an audio only telehealth visit, and RHYLEN PULIDO verbally agreed to proceed in this manner.   All issues noted in this document were discussed and addressed.  No physical exam could be performed with this format.  Evaluation Performed:  Preoperative cardiovascular risk assessment _____________   Date:  11/04/2023   Patient ID:  [name] [name], DOB 11-May-1941, MRN [id number] Patient Location:  Home Provider location:   Office  Primary Care Provider:  Raymon Mutton., FNP Primary Cardiologist:  Tonny Bollman, MD  Chief Complaint / Patient Profile   83 y.o. y/o female with a h/o CAD s/p DES-RCA in 2007, POBA-mRCA, DES-dRCA in 2018, DES-pRCA in 2021, DES-pLAD in 2022, bacterial endocarditis, hypertension, hyperlipidemia, and type 2 diabetes who is pending left reverse shoulder arthroplasty with Dr. Francena Hanly of [facility] and presents today for telephonic preoperative cardiovascular risk assessment.  History of Present Illness    [name] [name] is a 83 y.o. female who presents via audio/video conferencing for a telehealth visit today.  Pt was last seen in cardiology clinic on 06/17/2023 by Dr. Excell Seltzer.  At that time ARLINE KETTER was doing well.  The patient is now pending procedure as outlined above. Since her last visit, she has been stable overall from a cardiac standpoint however, she does report new symptoms of intermittent chest pain, relieved with nitroglycerin, intermittent palpitations.  Past Medical History    Past Medical History:  Diagnosis Date   Allergy    Anal fissure    Anxiety    CAD (coronary artery disease)    s/p  inf MI 2007 - tx w/ DES to RCA // s/p POBA to [facility] and DES to dRCA in 11/18 ([facility] in [place], [place]) // Myoview 3/21: low risk // s/p DES to pRCA   Cataract    removed both eyes   Cerebrovascular disease    Carotid US 09/2017 [facility]): mild plaque (<50%) in both carotid arteries   Chronic neck pain    Chronic pain syndrome    Diabetes Mellitus, Type 2    Diverticular disease    DJD (degenerative joint disease)    Echocardiogram 10/2018    Echo 10/2018: EF 55-60, normal RVSF, mod MAC, mod TR, severe AoV calcification and sclerosis with nodular calcium/mobile area of calcium in the LVOT (small veg vs Lambl's excrescence  - consider TEE), mild AI, mild AS (mean 11).    Echocardiogram 04/2019    Echocardiogram 04/2019: EF 60-65, basal septal hypertrophy, grade 2 diastolic dysfunction, normal wall motion, normal RV SF, mild LAE, mod MAC, trivial MR, mod sclerosis of the aortic valve with mod aortic annular calcification, thin mobile filamentous structure on ventricular side of AV likely representing Lambl's excrescence, mild AI, mild TR   Frequent UTI's    Gastroesophageal reflux disease    H/O bacterial endocarditis    Rx w IV Abxs in 2020 ([facility] in [place], [place]) // F/u echo with AoV Lambl's excrescence   H/O hiatal hernia    HTN (hypertension)    Hx of fall 10/2020   Hx of MI 2007   Hx of Stroke    IBS (irritable bowel syndrome)  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 (Cervical Fusion)   Premature ventricular contractions    Tubular adenoma of colon    Ulcer    [facility] [place] [facility]   Urinary incontinence    Past Surgical History:  Procedure Laterality Date   APPENDECTOMY  1955   BLADDER  REPAIR  2007   BRONCHIAL BIOPSY  05/17/2022   Procedure: BRONCHIAL BIOPSIES;  Surgeon: Omar Person, MD;  Location: [facility] ENDOSCOPY;  Service: Pulmonary;;   BRONCHIAL BRUSHINGS  05/17/2022   Procedure: BRONCHIAL BRUSHINGS;  Surgeon: Omar Person, MD;  Location: [facility] ENDOSCOPY;  Service: Pulmonary;;   BRONCHIAL NEEDLE ASPIRATION BIOPSY  05/17/2022   Procedure: BRONCHIAL NEEDLE ASPIRATION BIOPSIES;  Surgeon: Omar Person, MD;  Location: [facility] ENDOSCOPY;  Service: Pulmonary;;   CARDIAC CATHETERIZATION  2007   Stents   CERVICAL SPINE SURGERY  07/2005   Dr Jannah East   CHOLECYSTECTOMY  2007   COLONOSCOPY     CORONARY PRESSURE/FFR WITH 3D MAPPING N/A 06/28/2021   Procedure: Coronary Pressure Wire/FFR w/3D Mapping;  Surgeon: Kathleene Hazel, MD;  Location: [facility] INVASIVE CV LAB;  Service: Cardiovascular;  Laterality: N/A;   CORONARY STENT INTERVENTION N/A 03/09/2020   Procedure: CORONARY STENT INTERVENTION;  Surgeon: Kathleene Hazel, MD;  Location: [facility] INVASIVE CV LAB;  Service: Cardiovascular;  Laterality: N/A;   CORONARY STENT INTERVENTION N/A 06/28/2021   Procedure: CORONARY STENT INTERVENTION;  Surgeon: Kathleene Hazel, MD;  Location: [facility] INVASIVE CV LAB;  Service: Cardiovascular;  Laterality: N/A;   DENTAL SURGERY  05/2013   replaced an inplant   EYE SURGERY  2011   Bilteral   FIDUCIAL MARKER PLACEMENT  05/17/2022   Procedure: FIDUCIAL MARKER PLACEMENT;  Surgeon: Omar Person, MD;  Location[facility] ENDOSCOPY;  Service: Pulmonary;;  LUL   I & D KNEE WITH POLY EXCHANGE  09/25/2011   Procedure: IRRIGATION AND DEBRIDEMENT KNEE WITH POLY EXCHANGE;  Surgeon: Eulas Post, MD;  Location: [facility] OR;  Service: Orthopedics;  Laterality: Left;   INCISION AND DRAINAGE ABSCESS / HEMATOMA OF BURSA / KNEE / THIGH  09/2011   JOINT REPLACEMENT  2012   left   LEFT HEART CATH AND CORONARY ANGIOGRAPHY N/A 03/09/2020   Procedure: LEFT HEART CATH AND CORONARY ANGIOGRAPHY;  Surgeon: Kathleene Hazel, MD;  Location: [facility] INVASIVE CV LAB;  Service: Cardiovascular;  Laterality: N/A;   LEFT HEART CATH AND CORONARY ANGIOGRAPHY N/A 06/28/2021   Procedure: LEFT HEART CATH AND CORONARY ANGIOGRAPHY;  Surgeon: Kathleene Hazel, MD;  Location: [facility] INVASIVE CV LAB;  Service: Cardiovascular;  Laterality: N/A;   left Total Knee Replacement  11/2010   Dr Eulah Pont   NECK SURGERY     has had 3 surgeries   POLYPECTOMY     POSTERIOR CERVICAL FUSION/FORAMINOTOMY  04/08/2012   Procedure: POSTERIOR CERVICAL FUSION/FORAMINOTOMY LEVEL 2;  Surgeon: Clydene Fake, MD;  Location: [facility] NEURO ORS;  Service: Neurosurgery;  Laterality: Left;  Left Cervical six-seven Foraminotomy, bilateral cervical seven-thoracic one foraminotomy, cervical six-seven, cervical seven-thoracic one fusion with posterior instrumentation   TEMPOROMANDIBULAR JOINT SURGERY     thumb surgery  TONSILLECTOMY     TONSILLECTOMY  1950   TOTAL ABDOMINAL HYSTERECTOMY     VIDEO BRONCHOSCOPY WITH RADIAL ENDOBRONCHIAL ULTRASOUND  05/17/2022   Procedure: VIDEO BRONCHOSCOPY WITH RADIAL ENDOBRONCHIAL ULTRASOUND;  Surgeon: Omar Person, MD;  Location: [facility] ENDOSCOPY;  Service: Pulmonary;;    Allergies  Allergies  Allergen Reactions   Macrobid [Nitrofurantoin] Nausea Only    Severe nausea   Sulfa Antibiotics Itching   Niacin And Related Other (See Comments)    Must take "Flush-free"   Codeine Nausea Only   Prednisone Itching and Rash   Rocephin [Ceftriaxone] Itching   Sulfonamide Derivatives Itching   Tetracycline Itching and Rash    Home Medications    Prior to Admission medications   Medication Sig Start Date End Date Taking? Authorizing Provider  acetaminophen (TYLENOL) 325 MG tablet Take 650 mg by mouth every 6 (six) hours as needed for moderate pain.    [provider]  amLODipine (NORVASC) 5 MG tablet Take 1 tablet (5 mg total) by mouth daily. 11/07/21   Myrlene Broker, MD  Ascorbic Acid (VITAMIN C) 500  MG CAPS Take 500 mg by mouth in the morning and at bedtime.    [provider]  aspirin EC 81 MG tablet Take 81 mg by mouth every morning.     [provider]  atorvastatin (LIPITOR) 80 MG tablet TAKE ONE TABLET BY MOUTH EVERYDAY AT BEDTIME 08/06/22   Etta Grandchild, MD  clopidogrel (PLAVIX) 75 MG tablet TAKE ONE CAPSULE BY MOUTH EVERY MORNING 08/06/22   Etta Grandchild, MD  dicyclomine (BENTYL) 10 MG capsule Take 10 mg by mouth 3 (three) times daily.    [provider]  ezetimibe (ZETIA) 10 MG tablet TAKE ONE TABLET BY MOUTH EVERY MORNING 08/06/22   Etta Grandchild, MD  famotidine (PEPCID) 40 MG tablet Take 40 mg by mouth 2 (two) times daily. 12/06/22   [provider]  gabapentin (NEURONTIN) 300 MG capsule Take 300 mg by mouth 3 (three) times daily.    [provider]  losartan (COZAAR) 25 MG tablet Take 1 tablet (25 mg total) by mouth daily. 02/12/22   Alver Sorrow, NP  metoprolol succinate (TOPROL-XL) 25 MG 24 hr tablet Take 3 tablets (75 mg total) by mouth every morning. 09/30/22 02/14/23  Berton Mount I, MD  montelukast (SINGULAIR) 10 MG tablet Take 10 mg by mouth at bedtime. 02/07/23   [provider]  nitroGLYCERIN (NITROSTAT) 0.4 MG SL tablet DISSOLVE 1 TABLET UNDER THE TONGUE EVERY 5 MINUTES AS NEEDED FOR CHEST PAIN. DO NOT EXCEED A TOTAL OF 3 DOSES IN 15 MINUTES. 11/29/21   Etta Grandchild, MD  Omega-3 Fatty Acids (FISH OIL) 1000 MG CAPS Take 1,000 mg by mouth in the morning and at bedtime.    [provider]  ondansetron (ZOFRAN) 4 MG tablet Take 4 mg by mouth every 8 (eight) hours as needed for vomiting or nausea. 02/07/23   [provider]  pantoprazole (PROTONIX) 40 MG tablet Take 40 mg by mouth 2 (two) times daily.    [provider]  Psyllium (DAILY FIBER PO) Take 1 capsule by mouth daily.    [provider]  venlafaxine XR (EFFEXOR-XR) 37.5 MG 24 hr capsule Take 37.5 mg by mouth every morning.  02/07/23   [provider]    Physical Exam    Vital Signs:  ADASYN MCADAMS does not have vital signs available for review today.  Given telephonic  nature of communication, physical exam is limited. AAOx3. NAD. Normal affect.  Speech and respirations are unlabored.  Accessory Clinical Findings    None  Assessment & Plan    1.  Preoperative Cardiovascular Risk Assessment:  Patient with new symptoms of chest pain, relieved with nitroglycerin, intermittent palpitations.    Unfortunately, I am able to provide surgical clearance at this time.  Patient will require an office visit for further evaluation prior to clearance.   I will reach out to our preoperative coverage team to have patient scheduled for an office visit.  Per Dr. Excell Seltzer, pt at low-risk of holding clopidogrel 5 days before surgery. Please resume Plavix as soon as possible postprocedure, at the discretion of the surgeon. Pt should continue ASA throughout the perioperative period.    A copy of this note will be routed to requesting surgeon.  Time:   Today, I have spent 5 minutes with the patient with telehealth technology discussing medical history, symptoms, and management plan.     Joylene Grapes, NP  11/04/2023, 1:39 PM

## 2023-11-04 NOTE — Progress Notes (Signed)
 Addendum: Reviewed and agree with assessment and management plan. Asha Grumbine, Carie Caddy, MD

## 2023-11-05 ENCOUNTER — Ambulatory Visit: Attending: Physician Assistant | Admitting: Physician Assistant

## 2023-11-05 ENCOUNTER — Encounter: Payer: Self-pay | Admitting: Physician Assistant

## 2023-11-05 ENCOUNTER — Telehealth: Payer: Self-pay | Admitting: *Deleted

## 2023-11-05 VITALS — BP 140/66 | HR 77 | Ht 60.0 in | Wt 124.8 lb

## 2023-11-05 DIAGNOSIS — I25118 Atherosclerotic heart disease of native coronary artery with other forms of angina pectoris: Secondary | ICD-10-CM | POA: Diagnosis not present

## 2023-11-05 DIAGNOSIS — I10 Essential (primary) hypertension: Secondary | ICD-10-CM

## 2023-11-05 DIAGNOSIS — Z01818 Encounter for other preprocedural examination: Secondary | ICD-10-CM | POA: Diagnosis not present

## 2023-11-05 DIAGNOSIS — E785 Hyperlipidemia, unspecified: Secondary | ICD-10-CM | POA: Diagnosis not present

## 2023-11-05 DIAGNOSIS — I2089 Other forms of angina pectoris: Secondary | ICD-10-CM

## 2023-11-05 DIAGNOSIS — I2510 Atherosclerotic heart disease of native coronary artery without angina pectoris: Secondary | ICD-10-CM

## 2023-11-05 DIAGNOSIS — E119 Type 2 diabetes mellitus without complications: Secondary | ICD-10-CM

## 2023-11-05 DIAGNOSIS — I2583 Coronary atherosclerosis due to lipid rich plaque: Secondary | ICD-10-CM

## 2023-11-05 MED ORDER — ISOSORBIDE MONONITRATE ER 30 MG PO TB24: 15.0000 mg | ORAL_TABLET | Freq: Every day | ORAL | 3 refills | Status: DC

## 2023-11-05 NOTE — Progress Notes (Signed)
 Cardiology Office Note:  .   Date:  11/05/2023  ID:  [name] [name], DOB 1941/03/19, MRN [id number] PCP: Raymon Mutton., FNP  [facility] Providers Cardiologist:  Tonny Bollman, MD Cardiology APP:  Beatrice Lecher, PA-C    History of Present Illness: .   [name] [name] is a 83 y.o. female  with a h/o CAD s/p DES-RCA in 2007, POBA-mRCA, DES-dRCA in 2018, DES-pRCA in 2021, DES-pLAD in 2022, bacterial endocarditis, hypertension, hyperlipidemia, and type 2 diabetes who is pending left reverse shoulder arthroplasty with Dr. Francena Hanly of [facility].  Patient saw Dr. Excell Seltzer 06/2023 and no angina.  She was contacted by our preop team yesterday and endorsed chest pain with NTG use. Last night she went to bed and had chest sqeezing chest pain relieved with 1 NTG. Sometimes she gets it when she's active and uses NTG. Also having stomach pain and needs a CT with contrast. Also has IBS and had bloody diarrhea last week. Went to [facility] ED and diagnosed with colitis. That has cleared up. Labs were stable. She still cooks and cleans some but does get chest pain. Patient has also had several falls recently-once when she got up quickly and rushing to the bathroom. She has a bruised face. Another time when climbing daughters steep step without hand rail and she fell forward.  ROS:    Studies Reviewed: Marland Kitchen    EKG Interpretation Date/Time:  Tuesday November 05 2023 13:14:45 EST Ventricular Rate:  68 PR Interval:  184 QRS Duration:  72 QT Interval:  404 QTC Calculation: 429 R Axis:   -31  Text Interpretation: Normal sinus rhythm Left axis deviation Cannot rule out Anterior infarct , age undetermined When compared with ECG of 17-Jun-2023 14:12, Borderline criteria for Inferior infarct are no longer Present Confirmed by Jacolyn Reedy [phone]) on 11/05/2023 1:17:12 PM    Prior CV Studies:    CARDIAC CATHETERIZATION   CARDIAC CATHETERIZATION 06/28/2021   Narrative   Mid LAD lesion is 30%  stenosed.   Prox Cx to Mid Cx lesion is 30% stenosed.   Mid RCA lesion is 10% stenosed.   Prox LAD to Mid LAD lesion is 70% stenosed.   A drug-eluting stent was successfully placed using a STENT ONYX FRONTIER 3.0X18.   Post intervention, there is a 0% residual stenosis.   Patent mid RCA stents   Severe proximal LAD stenosis that angiographically appears moderate to severe. RFR assessment with pressure wire of 0.88. Successful PTCA/DES x 1 proximal LAD (RFR guided) Mild to moderate stenosis in the non-dominant mid Circumflex Large dominant RCA with patent mid stents without restenosis Normal LVEDP   Recommendations: Continue DAPT with ASA/Plavix. Continue beta blocker and statin.   Findings Coronary Findings Diagnostic  Dominance: Right   Left Anterior Descending Vessel is large. Prox LAD to Mid LAD lesion is 70% stenosed. Mid LAD lesion is 30% stenosed.   Left Circumflex Prox Cx to Mid Cx lesion is 30% stenosed.   Right Coronary Artery Non-stenotic Prox RCA to Mid RCA lesion was previously treated. Mid RCA lesion is 10% stenosed. The lesion was previously treated using a stent (unknown type) over 2 years ago.   Intervention   Prox LAD to Mid LAD lesion Stent [place] GUIDE 6FR XBLAD3.5 guide catheter was inserted. Lesion crossed with guidewire. Pre-stent angioplasty was performed using a BALLN SAPPHIRE 2.5X12. A drug-eluting stent was successfully placed using a STENT ONYX FRONTIER 3.0X18. Stent strut is well apposed. Post-stent angioplasty  was performed using a BALLN SAPPHIRE [place] 3.25X12. Post-Intervention Lesion Assessment The intervention was successful. Pre-interventional TIMI flow is 3. Post-intervention TIMI flow is 3. No complications occurred at this lesion. There is a 0% residual stenosis post intervention.     CARDIAC CATHETERIZATION   CARDIAC CATHETERIZATION 03/09/2020   Narrative  Mid RCA lesion is 10% stenosed.  Prox RCA to Mid RCA lesion is 95%  stenosed.  A drug-eluting stent was successfully placed using a STENT RESOLUTE ONYX [id number].  Post intervention, there is a 0% residual stenosis.  Prox Cx to Mid Cx lesion is 30% stenosed.  Prox LAD to Mid LAD lesion is 50% stenosed.  Mid LAD lesion is 30% stenosed.   1. Large, dominant RCA with patent mid stents. Severe stenosis just prior to the stented segment in the mid vessel. 2. Moderate non-obstructive proximal LAD stenosis 3. Successful PTCA/DES x 1 mid RCA   Recommendations: Continue DAPT with ASA and Plavix. Continue statin and beta blocker.   Findings Coronary Findings Diagnostic  Dominance: Right   Left Anterior Descending Vessel is large. Prox LAD to Mid LAD lesion is 50% stenosed. Mid LAD lesion is 30% stenosed.   Left Circumflex Prox Cx to Mid Cx lesion is 30% stenosed.   Right Coronary Artery Prox RCA to Mid RCA lesion is 95% stenosed. Mid RCA lesion is 10% stenosed. The lesion was previously treated using a stent (unknown type) over 2 years ago.   Intervention   Prox RCA to Mid RCA lesion Stent Pre-stent angioplasty was performed using a BALLOON SAPPHIRE 2.0X12. A drug-eluting stent was successfully placed using a STENT RESOLUTE ONYX [id number]. Stent strut is well apposed. Post-stent angioplasty was performed using a BALLOON SAPPHIRE [place] 3.0X12. Post-Intervention Lesion Assessment The intervention was successful. Pre-interventional TIMI flow is 3. Post-intervention TIMI flow is 3. No complications occurred at this lesion. There is a 0% residual stenosis post intervention.   STRESS TESTS   MYOCARDIAL PERFUSION IMAGING 11/12/2019   Narrative  Nuclear stress EF: 73%.  There was no ST segment deviation noted during stress.  No T wave inversion was noted during stress.  The study is normal.  This is a low risk study.  The left ventricular ejection fraction is hyperdynamic (>65%).   Impression:   1. Normal myocardial perfusion imaging study  without evidence of ischemia or infarction. 2. Normal LVEF, >65%.   Gerri Spore T. Flora Lipps, MD [facility] [address] [place], [place] [address] [phone] 3:13 PM   ECHOCARDIOGRAM   ECHOCARDIOGRAM COMPLETE 01/15/2022   Narrative ECHOCARDIOGRAM REPORT       Patient Name:   [name] [name] Date of Exam: 01/15/2022 Medical Rec #:  [id number]       Height:       59.0 in Accession #:    [id number]      Weight:       125.6 lb Date of Birth:  1940-11-08       BSA:          1.513 m Patient Age:    81 years        BP:           144/78 mmHg Patient Gender: F               HR:           66 bpm. Exam Location:  [place]   Procedure: 2D Echo, Cardiac Doppler, Color Doppler and Strain Analysis   Indications:  Dyspnea R06.00   History:        Patient has prior history of Echocardiogram examinations, most recent 05/01/2019. CAD, Signs/Symptoms:Chest Pain; Risk Factors:Dyslipidemia and Hypertension.   Sonographer:    Louie Boston RDCS Referring Phys: [id number] CAITLIN S WALKER   IMPRESSIONS     1. Left ventricular ejection fraction, by estimation, is 60 to 65%. The left ventricle has normal function. The left ventricle has no regional wall motion abnormalities. There is mild concentric left ventricular hypertrophy. Left ventricular diastolic parameters are consistent with Grade I diastolic dysfunction (impaired relaxation). Elevated left ventricular end-diastolic pressure. The average left ventricular global longitudinal strain is -12.8 %. The global longitudinal strain is abnormal. 2. Right ventricular systolic function is normal. The right ventricular size is normal. There is mildly elevated pulmonary artery systolic pressure. 3. Left atrial size was mildly dilated. 4. The mitral valve is degenerative. Mild mitral valve regurgitation. No evidence of mitral stenosis. 5. The aortic valve is tricuspid. Aortic valve regurgitation is mild. Aortic valve  sclerosis/calcification is present, without any evidence of aortic stenosis. 6. Aortic Normal DTA. 7. The inferior vena cava is normal in size with greater than 50% respiratory variability, suggesting right atrial pressure of 3 mmHg.   FINDINGS Left Ventricle: Left ventricular ejection fraction, by estimation, is 60 to 65%. The left ventricle has normal function. The left ventricle has no regional wall motion abnormalities. The average left ventricular global longitudinal strain is -12.8 %. The global longitudinal strain is abnormal. The left ventricular internal cavity size was normal in size. There is mild concentric left ventricular hypertrophy. Left ventricular diastolic parameters are consistent with Grade I diastolic dysfunction (impaired relaxation). Elevated left ventricular end-diastolic pressure.   Right Ventricle: The right ventricular size is normal. No increase in right ventricular wall thickness. Right ventricular systolic function is normal. There is mildly elevated pulmonary artery systolic pressure. The tricuspid regurgitant velocity is 2.97 m/s, and with an assumed right atrial pressure of 3 mmHg, the estimated right ventricular systolic pressure is 38.3 mmHg.   Left Atrium: Left atrial size was mildly dilated.   Right Atrium: Right atrial size was normal in size.   Pericardium: There is no evidence of pericardial effusion.   Mitral Valve: The mitral valve is degenerative in appearance. Mild mitral annular calcification. Mild mitral valve regurgitation. No evidence of mitral valve stenosis.   Tricuspid Valve: The tricuspid valve is normal in structure. Tricuspid valve regurgitation is mild . No evidence of tricuspid stenosis.   Aortic Valve: The aortic valve is tricuspid. Aortic valve regurgitation is mild. Aortic regurgitation PHT measures 445 msec. Aortic valve sclerosis/calcification is present, without any evidence of aortic stenosis. Aortic valve mean gradient  measures 10.0 mmHg. Aortic valve peak gradient measures 18.2 mmHg. Aortic valve area, by VTI measures 2.27 cm.   Pulmonic Valve: The pulmonic valve was normal in structure. Pulmonic valve regurgitation is not visualized. No evidence of pulmonic stenosis.   Aorta: The aortic root and ascending aorta are structurally normal, with no evidence of dilitation, the aortic arch was not well visualized and Normal DTA.   Venous: A normal flow pattern is recorded from the right upper pulmonary vein. The inferior vena cava is normal in size with greater than 50% respiratory variability, suggesting right atrial pressure of 3 mmHg.   IAS/Shunts: No atrial level shunt detected by color flow Doppler.     LEFT VENTRICLE PLAX 2D LVIDd:         3.80 cm   Diastology LVIDs:  2.60 cm   LV e' medial:    5.11 cm/s LV PW:         1.30 cm   LV E/e' medial:  17.3 LV IVS:        1.20 cm   LV e' lateral:   6.74 cm/s LVOT diam:     2.00 cm   LV E/e' lateral: 13.1 LV SV:         107 LV SV Index:   71        2D Longitudinal Strain LVOT Area:     3.14 cm  2D Strain GLS Avg:     -12.8 %     RIGHT VENTRICLE             IVC RV S prime:     11.40 cm/s  IVC diam: 1.10 cm TAPSE (M-mode): 2.1 cm   LEFT ATRIUM             Index        RIGHT ATRIUM           Index LA diam:        3.90 cm 2.58 cm/m   RA Area:     12.80 cm LA Vol (A2C):   44.3 ml 29.27 ml/m  RA Volume:   24.90 ml  16.45 ml/m LA Vol (A4C):   56.5 ml 37.33 ml/m LA Biplane Vol: 52.4 ml 34.62 ml/m AORTIC VALVE AV Area (Vmax):    2.22 cm AV Area (Vmean):   2.32 cm AV Area (VTI):     2.27 cm AV Vmax:           213.50 cm/s AV Vmean:          150.000 cm/s AV VTI:            0.473 m AV Peak Grad:      18.2 mmHg AV Mean Grad:      10.0 mmHg LVOT Vmax:         151.00 cm/s LVOT Vmean:        111.000 cm/s LVOT VTI:          0.342 m LVOT/AV VTI ratio: 0.72 AI PHT:            445 msec   AORTA Ao Root diam: 3.00 cm Ao Asc diam:  2.90 cm Ao  Desc diam: 1.90 cm   MITRAL VALVE               TRICUSPID VALVE MV Area (PHT): 4.44 cm    TR Peak grad:   35.3 mmHg MV Decel Time: 171 msec    TR Vmax:        297.00 cm/s MV E velocity: 88.30 cm/s MV A velocity: 79.70 cm/s  SHUNTS MV E/A ratio:  1.11        Systemic VTI:  0.34 m Systemic Diam: 2.00 cm   Norman Herrlich MD Electronically signed by Norman Herrlich MD Signature Date/Time: 01/15/2022/4:55:36 PM       Final    MONITORS   LONG TERM MONITOR (3-14 DAYS) 01/05/2022   Narrative Patch Wear Time:  10 days and 16 hours (2023-04-20T15:22:27-0400 to 2023-05-01T08:10:14-0400)   Patient had a min HR of 53 bpm, max HR of 210 bpm, and avg HR of 74 bpm. Predominant underlying rhythm was Sinus Rhythm. 1 run of Ventricular Tachycardia occurred lasting 5 beats with a max rate of 167 bpm (avg 147 bpm). 74 Supraventricular Tachycardia runs occurred, the run with the fastest interval lasting  6 beats with a max rate of 210 bpm, the longest lasting 12 beats with an avg rate of 130 bpm. Isolated SVEs were occasional (3.1%, 35076), SVE Couplets were rare (<1.0%, 1003), and SVE Triplets were rare (<1.0%, 1163). Isolated VEs were rare (<1.0%, 104), VE Couplets were rare (<1.0%, 3), and no VE Triplets were present.   SUMMARY: findings as outlined above - NSR with an average HR of 74 bpm. No afib or flutter. No bradycardic events. Rare PVC's, occasional PAC's. No sustained arrhythmias.      Risk Assessment/Calculations:          Physical Exam:   VS:  BP (!) 140/66   Pulse 77   Ht 5' (1.524 m)   Wt 124 lb 12.8 oz (56.6 kg)   SpO2 97%   BMI 24.37 kg/m    Wt Readings from Last 3 Encounters:  11/05/23 124 lb 12.8 oz (56.6 kg)  11/01/23 124 lb 8 oz (56.5 kg)  10/19/23 121 lb 4.1 oz (55 kg)    GEN: Well nourished, well developed in no acute distress NECK: No JVD; No carotid bruits CARDIAC:  RRR,1/6 systolic murmur LSB RESPIRATORY:  Clear to auscultation without rales, wheezing or rhonchi   ABDOMEN: Soft, non-tender, non-distended EXTREMITIES:  No edema; No deformity   ASSESSMENT AND PLAN: .    CAD with multiple procedures as listed above. Now with chest pain releived with NTG and needing shoulder surgery. Will add Imdur 15 mg daily and order NST and see back before clearance  Preop clearance for shoulder surgery According to the Revised Cardiac Risk Index (RCRI), her Perioperative Risk of Major Cardiac Event is (%): 0.9  Her Functional Capacity in METs is: 4.4 according to the Duke Activity Status Index (DASI). See above currently having chest pain and need to do testing prior to clearing her for surgery.  HTN- BP up a little today but usually not. Starting low dose Imdur  HLD-no recent lipids on file. Check when she comes for her lexiscan  DM2-per PCP  History of bacterial endocarditis treated [place], [place]     Informed Consent   Shared Decision Making/Informed Consent The risks [chest pain, shortness of breath, cardiac arrhythmias, dizziness, blood pressure fluctuations, myocardial infarction, stroke/transient ischemic attack, nausea, vomiting, allergic reaction, radiation exposure, metallic taste sensation and life-threatening complications (estimated to be 1 in 10,000)], benefits (risk stratification, diagnosing coronary artery disease, treatment guidance) and alternatives of a nuclear stress test were discussed in detail with [name] [name] and she agrees to proceed.     Dispo: f/u after stress test.   Signed, Jacolyn Reedy, PA-C

## 2023-11-05 NOTE — Telephone Encounter (Signed)
-----   Message from Joylene Grapes sent at 11/04/2023  1:51 PM EST ----- See notes. Pt needs office visit for new symptoms of chest pain relieved with nitroglycerin. Thank you.

## 2023-11-05 NOTE — Telephone Encounter (Signed)
 I called the pt and d/w pt per preop APP Bernadene Person, NP she will need an appt in office for chest pain and using NTG w/relieve. Pt is agreeable to see Jacolyn Reedy, [facility] today at 12:45. Pt is grateful for our help. Pt will also need preop clearance.   I will update all parties involved.

## 2023-11-05 NOTE — Patient Instructions (Addendum)
 Medication Instructions:  Start Imdur 15 mg once daily (1/2 tablet) *If you need a refill on your cardiac medications before your next appointment, please call your pharmacy*   Labs: Fasting Lipid panel  Testing/Procedures: Lexiscan Stress Test Your physician has requested that you have a lexiscan myoview. For further information please visit [email or web address] Please follow instruction sheet, as given.   Follow-Up: At [facility], you and your health needs are our priority.  As part of our continuing mission to provide you with exceptional heart care, we have created designated Provider Care Teams.  These Care Teams include your primary Cardiologist (physician) and Advanced Practice Providers (APPs -  Physician Assistants and Nurse Practitioners) who all work together to provide you with the care you need, when you need it.  We recommend signing up for the patient portal called "MyChart".  Sign up information is provided on this After Visit Summary.  MyChart is used to connect with patients for Virtual Visits (Telemedicine).  Patients are able to view lab/test results, encounter notes, upcoming appointments, etc.  Non-urgent messages can be sent to your provider as well.   To learn more about what you can do with MyChart, go to [email or web address].    Your next appointment:   After Eugenie Birks Stress Test  Provider:   Jacolyn Reedy, PA  Other Instructions   1st Floor: - Lobby - Registration  - Pharmacy  - Lab - Cafe  2nd Floor: - PV Lab - Diagnostic Testing (echo, CT, nuclear med)  3rd Floor: - Vacant  4th Floor: - TCTS (cardiothoracic surgery) - AFib Clinic - Structural Heart Clinic - Vascular Surgery  - Vascular Ultrasound  5th Floor: - HeartCare Cardiology (general and EP) - Clinical Pharmacy for coumadin, hypertension, lipid, weight-loss medications, and med management appointments    Valet parking services will be available as well.

## 2023-11-05 NOTE — Telephone Encounter (Signed)
 November 05, 2023 Me     11/05/23  9:20 AM Note I called the pt and d/w pt per preop APP Bernadene Person, NP she will need an appt in office for chest pain and using NTG w/relieve. Pt is agreeable to see [name] [name], [facility] today at 12:45. Pt is grateful for our help. Pt will also need preop clearance.    I will update all parties involved.           11/05/23  9:18 AM You contacted [name] [name], [facility]"    Me     11/05/23  9:18 AM Note ----- Message from [name] [name] sent at 11/04/2023  1:51 PM EST ----- See notes. Pt needs office visit for new symptoms of chest pain relieved with nitroglycerin. Thank you.

## 2023-11-08 ENCOUNTER — Ambulatory Visit (HOSPITAL_COMMUNITY)
Admission: RE | Admit: 2023-11-08 | Discharge: 2023-11-08 | Disposition: A | Discharge status: Home / Self Care | Payer: Medicare Other | Source: Ambulatory Visit | Attending: Nurse Practitioner | Admitting: Nurse Practitioner

## 2023-11-08 ENCOUNTER — Encounter (HOSPITAL_COMMUNITY): Payer: Self-pay

## 2023-11-08 DIAGNOSIS — K529 Noninfective gastroenteritis and colitis, unspecified: Secondary | ICD-10-CM | POA: Insufficient documentation

## 2023-11-08 DIAGNOSIS — R1030 Lower abdominal pain, unspecified: Secondary | ICD-10-CM | POA: Insufficient documentation

## 2023-11-08 MED ORDER — IOHEXOL 300 MG/ML  SOLN
100.0000 mL | Freq: Once | INTRAMUSCULAR | Status: AC | PRN
Start: 2023-11-08 — End: 2023-11-08
  Administered 2023-11-08: 100 mL via INTRAVENOUS

## 2023-11-08 NOTE — Telephone Encounter (Signed)
 Dr. Rennis Chris always my pleasure to help out.

## 2023-11-15 ENCOUNTER — Ambulatory Visit (HOSPITAL_COMMUNITY): Attending: Cardiology

## 2023-11-15 DIAGNOSIS — I2583 Coronary atherosclerosis due to lipid rich plaque: Secondary | ICD-10-CM | POA: Insufficient documentation

## 2023-11-15 DIAGNOSIS — I2510 Atherosclerotic heart disease of native coronary artery without angina pectoris: Secondary | ICD-10-CM | POA: Diagnosis present

## 2023-11-15 LAB — MYOCARDIAL PERFUSION IMAGING
LV dias vol: 56 mL (ref 46–106)
LV sys vol: 16 mL
Nuc Stress EF: 71 %
Peak HR: 84 {beats}/min
Rest HR: 64 {beats}/min
Rest Nuclear Isotope Dose: 10.7 mCi
SDS: 2
SRS: 0
SSS: 2
ST Depression (mm): 0 mm
Stress Nuclear Isotope Dose: 29.6 mCi
TID: 1

## 2023-11-15 MED ORDER — TECHNETIUM TC 99M TETROFOSMIN IV KIT
29.6000 | PACK | Freq: Once | INTRAVENOUS | Status: AC | PRN
  Administered 2023-11-15: 29.6 via INTRAVENOUS

## 2023-11-15 MED ORDER — REGADENOSON 0.4 MG/5ML IV SOLN
0.4000 mg | Freq: Once | INTRAVENOUS | Status: AC
  Administered 2023-11-15: 0.4 mg via INTRAVENOUS

## 2023-11-15 MED ORDER — TECHNETIUM TC 99M TETROFOSMIN IV KIT
10.7000 | PACK | Freq: Once | INTRAVENOUS | Status: AC | PRN
  Administered 2023-11-15: 10.7 via INTRAVENOUS

## 2023-11-19 ENCOUNTER — Encounter (HOSPITAL_BASED_OUTPATIENT_CLINIC_OR_DEPARTMENT_OTHER): Payer: Self-pay

## 2023-11-19 NOTE — Progress Notes (Signed)
 Cardiology Office Note:  .   Date:  12/03/2023  ID:  [name] [name], DOB 05-11-1941, MRN [id number] PCP: Cristino Martes, NP  [facility] Providers Cardiologist:  Tonny Bollman, MD Cardiology APP:  Beatrice Lecher, PA-C    History of Present Illness: .   [name] [name] is a 83 y.o. female  with a h/o CAD s/p DES-RCA in 2007, POBA-mRCA, DES-dRCA in 2018, DES-pRCA in 2021, DES-pLAD in 2022, bacterial endocarditis, hypertension, hyperlipidemia, and type 2 diabetes who is pending left reverse shoulder arthroplasty with Dr. Francena Hanly of [facility]   I saw her 11/05/23 with chest pain, also having stomache pain and colitis. I ordered and NST that was normal. She returns and now says she feels her heart racing at times. Lasts a few minutes and then stops. Notices it after she does her chores and sits down, sometime walking or laying down at night. Occurs about once ever couple weeks.  Not drinking caffeine. BP was up at PCP last week and at home 155/70-80's. Better today. Gets meals on wheels. Watches salt closely. Has had a couple of falls since she was here last. Doesn't say she's dizzy, just off balance. No further chest pain.  ROS:    Studies Reviewed: Marland Kitchen         Prior CV Studies:    CARDIAC CATHETERIZATION 06/28/2021   Narrative   Mid LAD lesion is 30% stenosed.   Prox Cx to Mid Cx lesion is 30% stenosed.   Mid RCA lesion is 10% stenosed.   Prox LAD to Mid LAD lesion is 70% stenosed.   A drug-eluting stent was successfully placed using a STENT ONYX FRONTIER 3.0X18.   Post intervention, there is a 0% residual stenosis.   Patent mid RCA stents   Severe proximal LAD stenosis that angiographically appears moderate to severe. RFR assessment with pressure wire of 0.88. Successful PTCA/DES x 1 proximal LAD (RFR guided) Mild to moderate stenosis in the non-dominant mid Circumflex Large dominant RCA with patent mid stents without restenosis Normal LVEDP   Recommendations:  Continue DAPT with ASA/Plavix. Continue beta blocker and statin.   Findings Coronary Findings Diagnostic  Dominance: Right   Left Anterior Descending Vessel is large. Prox LAD to Mid LAD lesion is 70% stenosed. Mid LAD lesion is 30% stenosed.   Left Circumflex Prox Cx to Mid Cx lesion is 30% stenosed.   Right Coronary Artery Non-stenotic Prox RCA to Mid RCA lesion was previously treated. Mid RCA lesion is 10% stenosed. The lesion was previously treated using a stent (unknown type) over 2 years ago.   Intervention   Prox LAD to Mid LAD lesion Stent [place] GUIDE 6FR XBLAD3.5 guide catheter was inserted. Lesion crossed with guidewire. Pre-stent angioplasty was performed using a BALLN SAPPHIRE 2.5X12. A drug-eluting stent was successfully placed using a STENT ONYX FRONTIER 3.0X18. Stent strut is well apposed. Post-stent angioplasty was performed using a BALLN SAPPHIRE [place] 3.25X12. Post-Intervention Lesion Assessment The intervention was successful. Pre-interventional TIMI flow is 3. Post-intervention TIMI flow is 3. No complications occurred at this lesion. There is a 0% residual stenosis post intervention.     CARDIAC CATHETERIZATION   CARDIAC CATHETERIZATION 03/09/2020   Narrative  Mid RCA lesion is 10% stenosed.  Prox RCA to Mid RCA lesion is 95% stenosed.  A drug-eluting stent was successfully placed using a STENT RESOLUTE ONYX [id number].  Post intervention, there is a 0% residual stenosis.  Prox Cx to Mid Cx lesion is 30% stenosed.  Prox LAD to Mid LAD lesion is 50% stenosed.  Mid LAD lesion is 30% stenosed.   1. Large, dominant RCA with patent mid stents. Severe stenosis just prior to the stented segment in the mid vessel. 2. Moderate non-obstructive proximal LAD stenosis 3. Successful PTCA/DES x 1 mid RCA   Recommendations: Continue DAPT with ASA and Plavix. Continue statin and beta blocker.   Findings Coronary Findings Diagnostic  Dominance: Right   Left  Anterior Descending Vessel is large. Prox LAD to Mid LAD lesion is 50% stenosed. Mid LAD lesion is 30% stenosed.   Left Circumflex Prox Cx to Mid Cx lesion is 30% stenosed.   Right Coronary Artery Prox RCA to Mid RCA lesion is 95% stenosed. Mid RCA lesion is 10% stenosed. The lesion was previously treated using a stent (unknown type) over 2 years ago.   Intervention   Prox RCA to Mid RCA lesion Stent Pre-stent angioplasty was performed using a BALLOON SAPPHIRE 2.0X12. A drug-eluting stent was successfully placed using a STENT RESOLUTE ONYX [id number]. Stent strut is well apposed. Post-stent angioplasty was performed using a BALLOON SAPPHIRE [place] 3.0X12. Post-Intervention Lesion Assessment The intervention was successful. Pre-interventional TIMI flow is 3. Post-intervention TIMI flow is 3. No complications occurred at this lesion. There is a 0% residual stenosis post intervention.   STRESS TESTS   MYOCARDIAL PERFUSION IMAGING 11/12/2019   Narrative  Nuclear stress EF: 73%.  There was no ST segment deviation noted during stress.  No T wave inversion was noted during stress.  The study is normal.  This is a low risk study.  The left ventricular ejection fraction is hyperdynamic (>65%).   Impression:   1. Normal myocardial perfusion imaging study without evidence of ischemia or infarction. 2. Normal LVEF, >65%.   Gerri Spore T. Flora Lipps, MD [facility] [address], [address] [place], [place] [address] [phone] 3:13 PM   ECHOCARDIOGRAM   ECHOCARDIOGRAM COMPLETE 01/15/2022   Narrative ECHOCARDIOGRAM REPORT       Patient Name:   [name] [name] Date of Exam: 01/15/2022 Medical Rec #:  [id number]       Height:       59.0 in Accession #:    [id number]      Weight:       125.6 lb Date of Birth:  1941-07-09       BSA:          1.513 m Patient Age:    81 years        BP:           144/78 mmHg Patient Gender: F               HR:           66 bpm. Exam  Location:  [facility]   Procedure: 2D Echo, Cardiac Doppler, Color Doppler and Strain Analysis   Indications:    Dyspnea R06.00   History:        Patient has prior history of Echocardiogram examinations, most recent 05/01/2019. CAD, Signs/Symptoms:Chest Pain; Risk Factors:Dyslipidemia and Hypertension.   Sonographer:    Louie Boston RDCS Referring Phys: [id number] CAITLIN S WALKER   IMPRESSIONS     1. Left ventricular ejection fraction, by estimation, is 60 to 65%. The left ventricle has normal function. The left ventricle has no regional wall motion abnormalities. There is mild concentric left ventricular hypertrophy. Left ventricular diastolic parameters are consistent with Grade I diastolic dysfunction (impaired relaxation). Elevated left ventricular end-diastolic  pressure. The average left ventricular global longitudinal strain is -12.8 %. The global longitudinal strain is abnormal. 2. Right ventricular systolic function is normal. The right ventricular size is normal. There is mildly elevated pulmonary artery systolic pressure. 3. Left atrial size was mildly dilated. 4. The mitral valve is degenerative. Mild mitral valve regurgitation. No evidence of mitral stenosis. 5. The aortic valve is tricuspid. Aortic valve regurgitation is mild. Aortic valve sclerosis/calcification is present, without any evidence of aortic stenosis. 6. Aortic Normal DTA. 7. The inferior vena cava is normal in size with greater than 50% respiratory variability, suggesting right atrial pressure of 3 mmHg.   FINDINGS Left Ventricle: Left ventricular ejection fraction, by estimation, is 60 to 65%. The left ventricle has normal function. The left ventricle has no regional wall motion abnormalities. The average left ventricular global longitudinal strain is -12.8 %. The global longitudinal strain is abnormal. The left ventricular internal cavity size was normal in size. There is mild concentric left ventricular hypertrophy.  Left ventricular diastolic parameters are consistent with Grade I diastolic dysfunction (impaired relaxation). Elevated left ventricular end-diastolic pressure.   Right Ventricle: The right ventricular size is normal. No increase in right ventricular wall thickness. Right ventricular systolic function is normal. There is mildly elevated pulmonary artery systolic pressure. The tricuspid regurgitant velocity is 2.97 m/s, and with an assumed right atrial pressure of 3 mmHg, the estimated right ventricular systolic pressure is 38.3 mmHg.   Left Atrium: Left atrial size was mildly dilated.   Right Atrium: Right atrial size was normal in size.   Pericardium: There is no evidence of pericardial effusion.   Mitral Valve: The mitral valve is degenerative in appearance. Mild mitral annular calcification. Mild mitral valve regurgitation. No evidence of mitral valve stenosis.   Tricuspid Valve: The tricuspid valve is normal in structure. Tricuspid valve regurgitation is mild . No evidence of tricuspid stenosis.   Aortic Valve: The aortic valve is tricuspid. Aortic valve regurgitation is mild. Aortic regurgitation PHT measures 445 msec. Aortic valve sclerosis/calcification is present, without any evidence of aortic stenosis. Aortic valve mean gradient measures 10.0 mmHg. Aortic valve peak gradient measures 18.2 mmHg. Aortic valve area, by VTI measures 2.27 cm.   Pulmonic Valve: The pulmonic valve was normal in structure. Pulmonic valve regurgitation is not visualized. No evidence of pulmonic stenosis.   Aorta: The aortic root and ascending aorta are structurally normal, with no evidence of dilitation, the aortic arch was not well visualized and Normal DTA.   Venous: A normal flow pattern is recorded from the right upper pulmonary vein. The inferior vena cava is normal in size with greater than 50% respiratory variability, suggesting right atrial pressure of 3 mmHg.   IAS/Shunts: No atrial level shunt  detected by color flow Doppler.     LEFT VENTRICLE PLAX 2D LVIDd:         3.80 cm   Diastology LVIDs:         2.60 cm   LV e' medial:    5.11 cm/s LV PW:         1.30 cm   LV E/e' medial:  17.3 LV IVS:        1.20 cm   LV e' lateral:   6.74 cm/s LVOT diam:     2.00 cm   LV E/e' lateral: 13.1 LV SV:         107 LV SV Index:   71        2D Longitudinal Strain LVOT  Area:     3.14 cm  2D Strain GLS Avg:     -12.8 %     RIGHT VENTRICLE             IVC RV S prime:     11.40 cm/s  IVC diam: 1.10 cm TAPSE (M-mode): 2.1 cm   LEFT ATRIUM             Index        RIGHT ATRIUM           Index LA diam:        3.90 cm 2.58 cm/m   RA Area:     12.80 cm LA Vol (A2C):   44.3 ml 29.27 ml/m  RA Volume:   24.90 ml  16.45 ml/m LA Vol (A4C):   56.5 ml 37.33 ml/m LA Biplane Vol: 52.4 ml 34.62 ml/m AORTIC VALVE AV Area (Vmax):    2.22 cm AV Area (Vmean):   2.32 cm AV Area (VTI):     2.27 cm AV Vmax:           213.50 cm/s AV Vmean:          150.000 cm/s AV VTI:            0.473 m AV Peak Grad:      18.2 mmHg AV Mean Grad:      10.0 mmHg LVOT Vmax:         151.00 cm/s LVOT Vmean:        111.000 cm/s LVOT VTI:          0.342 m LVOT/AV VTI ratio: 0.72 AI PHT:            445 msec   AORTA Ao Root diam: 3.00 cm Ao Asc diam:  2.90 cm Ao Desc diam: 1.90 cm   MITRAL VALVE               TRICUSPID VALVE MV Area (PHT): 4.44 cm    TR Peak grad:   35.3 mmHg MV Decel Time: 171 msec    TR Vmax:        297.00 cm/s MV E velocity: 88.30 cm/s MV A velocity: 79.70 cm/s  SHUNTS MV E/A ratio:  1.11        Systemic VTI:  0.34 m Systemic Diam: 2.00 cm   Norman Herrlich MD Electronically signed by Norman Herrlich MD Signature Date/Time: 01/15/2022/4:55:36 PM       Final    MONITORS   LONG TERM MONITOR (3-14 DAYS) 01/05/2022   Narrative Patch Wear Time:  10 days and 16 hours (2023-04-20T15:22:27-0400 to 2023-05-01T08:10:14-0400)   Patient had a min HR of 53 bpm, max HR of 210 bpm, and avg HR of 74  bpm. Predominant underlying rhythm was Sinus Rhythm. 1 run of Ventricular Tachycardia occurred lasting 5 beats with a max rate of 167 bpm (avg 147 bpm). 74 Supraventricular Tachycardia runs occurred, the run with the fastest interval lasting 6 beats with a max rate of 210 bpm, the longest lasting 12 beats with an avg rate of 130 bpm. Isolated SVEs were occasional (3.1%, 35076), SVE Couplets were rare (<1.0%, 1003), and SVE Triplets were rare (<1.0%, 1163). Isolated VEs were rare (<1.0%, 104), VE Couplets were rare (<1.0%, 3), and no VE Triplets were present.   SUMMARY: findings as outlined above - NSR with an average HR of 74 bpm. No afib or flutter. No bradycardic events. Rare PVC's, occasional PAC's. No sustained arrhythmias.  Risk Assessment/Calculations:          Physical Exam:   VS:  BP (!) 140/60 (BP Location: Right Arm)   [name] 71   Ht 4\' 11"  (1.499 m)   Wt 126 lb 6.4 oz (57.3 kg)   SpO2 95%   BMI 25.53 kg/m    Wt Readings from Last 3 Encounters:  12/03/23 126 lb 6.4 oz (57.3 kg)  11/21/23 121 lb 1.6 oz (54.9 kg)  11/15/23 124 lb (56.2 kg)    GEN: Well nourished, well developed in no acute distress NECK: No JVD; No carotid bruits CARDIAC:  RRR, no murmurs, rubs, gallops RESPIRATORY:  Clear to auscultation without rales, wheezing or rhonchi  ABDOMEN: Soft, non-tender, non-distended EXTREMITIES:  No edema; No deformity   ASSESSMENT AND PLAN: .    CAD with multiple procedures as listed above. Now with chest pain releived with NTG and needing shoulder surgery.  Imdur 15 mg daily added but will stop since increasing toprol.   NST normal.   Preop clearance for shoulder surgery Dr. Caryn Bee Supple-NST normal. She is an acceptable risk to proceed with surgery. According to the Revised Cardiac Risk Index (RCRI), her Perioperative Risk of Major Cardiac Event is (%): 0.9   Her Functional Capacity in METs is: 4.4 according to the Duke Activity Status Index  (DASI).  Palpitations-increase recently-will place 2 week zio and increase toprol xl 100 mg daily or 25 mg-4 tabs daily      HTN- BP running high at PCP but better here, has had several falls recently and off balance. No dizziness. Will stop Imdur and start toprol.   HLD-no recent lipids on file-were supposed to be checked with lexiscan-will order   DM2-per PCP   History of bacterial endocarditis treated [place], [place]          Dispo: f/u with me in 1 month after ZIo  Signed, Jacolyn Reedy, PA-C

## 2023-11-21 ENCOUNTER — Encounter (HOSPITAL_BASED_OUTPATIENT_CLINIC_OR_DEPARTMENT_OTHER): Payer: Self-pay | Admitting: Pulmonary Disease

## 2023-11-21 ENCOUNTER — Other Ambulatory Visit: Payer: Self-pay | Admitting: Family

## 2023-11-21 ENCOUNTER — Ambulatory Visit (HOSPITAL_BASED_OUTPATIENT_CLINIC_OR_DEPARTMENT_OTHER): Payer: Medicare Other | Admitting: Pulmonary Disease

## 2023-11-21 VITALS — BP 158/80 | HR 84 | Ht 59.0 in | Wt 121.1 lb

## 2023-11-21 DIAGNOSIS — E2839 Other primary ovarian failure: Secondary | ICD-10-CM

## 2023-11-21 DIAGNOSIS — R918 Other nonspecific abnormal finding of lung field: Secondary | ICD-10-CM | POA: Diagnosis not present

## 2023-11-21 NOTE — Progress Notes (Signed)
 Subjective:   PATIENT ID: [name] [name] GENDER: female DOB: [birth year], MRN: [id number]   HPI  Chief Complaint  Patient presents with   Follow-up    Nodule of upper lobe of lung    Reason for Visit: Follow-up   [name] is an 83 year old female never smoker with CAD s/p DES x 4 (last 2021), HTN, DM2, GERD, hx blood clots related to trauma s/p excision who presents for follow-up.  Initial consult She recently was seen by her PCP for fever and fatigue with no improvement on Z-pack. Dx with UTI however noted to have abnormal CXR leading to evaluation with CT Chest with demonstrated nodular density measuring 19 x 14 mm in the left apex concerning for malignancy. No mediastinal or hilar adenopathy.  At baseline she sometimes ambulate with a cane. Currently living independently and perform ADLs but is moving to a senior assistance for cleaning and meal assistance. Daughter reports she is sometimes a fall risk.  She walks up stairs slowly and will get winded. Denies cough, wheezing, hemoptysis, unintentional weight loss, night sweats or unexplained fevers/chills.  04/30/22 Daughter present to provide additional history. Reports intermittent low-grade fevers and fatigue. Denies significant weight loss or appetite change. Denies night sweats.  05/28/22 Daughter present and provides additional history with patient. Over two weeks ago she reports mechanical fall with left facial bruising and left knee swelling and bruising. Leg is tender with walking. She underwent navigational bronchoscopy on 9/14 which she tolerated well Denies shortness of breath, cough or wheezing. No unintentional weight loss, night sweats, unexplained fevers or chills.  07/30/22 Daughter presents. Recently had fever with UTI. Denies shortness of breath, cough or wheezing. No unintentional weight loss, change in appetite, night sweats.  11/27/22 She reports cough with pollen but otherwise no symptoms. Denies  shortness of breath or wheezing. Daughter, daughter and granddaughter (1 year) was present. She states that we also care for her family member [name] [name].  11/21/23 Presents for follow-up for lung nodules. No respiratory symptoms, denies shortness of breath, cough or wheezing. Did not receive CT chest for this month but has recent imaging from October and CT A/P available for review.  Social History: Never smoker Significant second smoke exposure Mother with pancreatic cancer Brother with lung cancer, heavy smoker Brother with laryngeal cancer, [place]  Environmental exposures: Significant mold in apartment  Past Medical History:  Diagnosis Date   Allergy    Anal fissure    Anxiety    CAD (coronary artery disease)    s/p inf MI 2007 - tx w/ DES to RCA // s/p POBA to [facility] and DES to dRCA in 11/18 ([facility] in [place], [place]) // Myoview 3/21: low risk // s/p DES to pRCA   Cataract    removed both eyes   Cerebrovascular disease    Carotid US 09/2017 [facility]): mild plaque (<50%) in both carotid arteries   Chronic neck pain    Chronic pain syndrome    Diabetes Mellitus, Type 2    Diverticular disease    DJD (degenerative joint disease)    Echocardiogram 10/2018    Echo 10/2018: EF 55-60, normal RVSF, mod MAC, mod TR, severe AoV calcification and sclerosis with nodular calcium/mobile area of calcium in the LVOT (small veg vs Lambl's excrescence  - consider TEE), mild AI, mild AS (mean 11).    Echocardiogram 04/2019    Echocardiogram 04/2019: EF 60-65, basal septal hypertrophy, grade 2 diastolic dysfunction, normal  wall motion, normal RV SF, mild LAE, mod MAC, trivial MR, mod sclerosis of the aortic valve with mod aortic annular calcification, thin mobile filamentous structure on ventricular side of AV likely representing Lambl's excrescence, mild AI, mild TR   Frequent UTI's    Gastroesophageal reflux disease    H/O bacterial endocarditis    Rx w IV Abxs in 2020 ([facility]  in [place], [place]) // F/u echo with AoV Lambl's excrescence   H/O hiatal hernia    HTN (hypertension)    Hx of fall 10/2020   Hx of MI 2007   Hx of Stroke    IBS (irritable bowel syndrome)    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 (Cervical Fusion)   Premature ventricular contractions    Tubular adenoma of colon    Ulcer    [facility] [place] [facility] [facility]   Urinary incontinence      Family History  Problem Relation Age of Onset   Heart disease Mother    Pancreatic cancer Mother    Colon polyps Sister    Pancreatitis Sister    Lung cancer Brother    Cancer - Other Brother        vocal cord cancer   Coronary artery disease Other        sibling   Coronary artery disease Other        sibling   Colon cancer Neg Hx    Esophageal cancer Neg Hx    Rectal cancer Neg Hx    Stomach cancer Neg Hx      Social History   Occupational History    Employer: RETIRED  Tobacco Use   Smoking status: Never    Passive exposure: Never   Smokeless tobacco: Never  Vaping Use   Vaping status: Never Used  Substance and Sexual Activity   Alcohol use: No   Drug use: No   Sexual activity: Yes    Partners: Male    Allergies  Allergen Reactions   Macrobid [Nitrofurantoin] Nausea Only    Severe nausea   Sulfa Antibiotics Itching   Niacin And Related Other (See Comments)    Must take "Flush-free"   Codeine Nausea Only   Prednisone Itching and Rash   Rocephin [Ceftriaxone] Itching   Sulfonamide Derivatives Itching   Tetracycline Itching and Rash     Outpatient Medications Prior to Visit  Medication Sig Dispense Refill   acetaminophen (TYLENOL) 325 MG tablet Take 650 mg by mouth every 6 (six) hours as  needed for moderate pain.     amLODipine (NORVASC) 5 MG tablet Take 1 tablet (5 mg total) by mouth daily. 90 tablet 3   Ascorbic Acid (VITAMIN C) 500 MG CAPS Take 500 mg by mouth in the morning and at bedtime.     aspirin EC 81 MG tablet Take 81 mg by mouth every morning.      atorvastatin (LIPITOR) 80 MG tablet TAKE ONE TABLET BY MOUTH EVERYDAY AT BEDTIME 90 tablet 0   clopidogrel (PLAVIX) 75 MG tablet TAKE ONE CAPSULE BY MOUTH EVERY MORNING 90 tablet 0   dicyclomine (BENTYL) 10 MG capsule Take 10 mg by mouth  3 (three) times daily.     ezetimibe (ZETIA) 10 MG tablet TAKE ONE TABLET BY MOUTH EVERY MORNING 90 tablet 0   famotidine (PEPCID) 40 MG tablet Take 40 mg by mouth 2 (two) times daily.     gabapentin (NEURONTIN) 300 MG capsule Take 300 mg by mouth 3 (three) times daily.     isosorbide mononitrate (IMDUR) 30 MG 24 hr tablet Take 0.5 tablets (15 mg total) by mouth daily. 90 tablet 3   losartan (COZAAR) 25 MG tablet Take 1 tablet (25 mg total) by mouth daily. 90 tablet 3   montelukast (SINGULAIR) 10 MG tablet Take 10 mg by mouth at bedtime.     nitroGLYCERIN (NITROSTAT) 0.4 MG SL tablet DISSOLVE 1 TABLET UNDER THE TONGUE EVERY 5 MINUTES AS NEEDED FOR CHEST PAIN. DO NOT EXCEED A TOTAL OF 3 DOSES IN 15 MINUTES. 25 tablet 2   Omega-3 Fatty Acids (FISH OIL) 1000 MG CAPS Take 1,000 mg by mouth in the morning and at bedtime.     ondansetron (ZOFRAN) 4 MG tablet Take 4 mg by mouth every 8 (eight) hours as needed for vomiting or nausea.     pantoprazole (PROTONIX) 40 MG tablet Take 40 mg by mouth 2 (two) times daily.     Psyllium (DAILY FIBER PO) Take 1 capsule by mouth daily.     venlafaxine XR (EFFEXOR-XR) 37.5 MG 24 hr capsule Take 37.5 mg by mouth every morning.     metoprolol succinate (TOPROL-XL) 25 MG 24 hr tablet Take 3 tablets (75 mg total) by mouth every morning. 90 tablet 0   No facility-administered medications prior to visit.    Review of Systems  Constitutional:  Negative for  chills, diaphoresis, fever, malaise/fatigue and weight loss.  HENT:  Negative for congestion.   Respiratory:  Negative for cough, hemoptysis, sputum production, shortness of breath and wheezing.   Cardiovascular:  Negative for chest pain, palpitations and leg swelling.     Objective:   Vitals:   11/21/23 1343  BP: (!) 158/80  Pulse: 84  SpO2: 95%  Weight: 121 lb 1.6 oz (54.9 kg)  Height: 4\' 11"  (1.499 m)    SpO2: 95 %  Physical Exam: General: Well-appearing, no acute distress HENT: [place], AT Eyes: EOMI, no scleral icterus Respiratory: Clear to auscultation bilaterally.  No crackles, wheezing or rales Cardiovascular: RRR, -M/R/G, no JVD Extremities:-Edema,-tenderness Neuro: AAO x4, CNII-XII grossly intact Psych: Normal mood, normal affect  Data Reviewed:  Imaging: CT Chest 03/22/22 - nodular density measuring 19 x 14 mm in the left apex. No hilar or mediastinal adenopathy PET 04/20/22 - Hypermetabolic 1.8 cm LUL apical lung nodule. No adenopathy or mets. CT Chest 07/18/22 - unchanged LUL apical nodule. Few basilar nodules developed in the interval in bases CT Chest 11/23/22 - Similar LUL apical nodule 2.0 x 1.5 cm CT Chest 06/11/23 - Left apical nodule with difucial marker decreased in size measuring 1.9 x 1.4 cm on series 5, image 13, previously 2.1 x 1.7 cm. Stable LLL ground glass nodule measuring 6 mm. Subpleural RLL previously 6 x4 mm, increased to 12 x 6 mm. Stable solid right lower lobe nodule measuring 4 mm on image 95. Stable solid 3 mm nodule of the right lower lobe  PFT: None on file  Labs: CBC    Component Value Date/Time   WBC 9.7 11/01/2023 1429   RBC 4.30 11/01/2023 1429   HGB 13.6 11/01/2023 1429   HGB 14.3 12/21/2021 1538   HCT 40.7 11/01/2023  1429   HCT 42.7 12/21/2021 1538   PLT 330.0 11/01/2023 1429   PLT 239 12/21/2021 1538   MCV 94.5 11/01/2023 1429   MCV 94 12/21/2021 1538   MCH 31.6 10/19/2023 1159   MCHC 33.4 11/01/2023 1429   RDW 13.9  11/01/2023 1429   RDW 12.3 12/21/2021 1538   LYMPHSABS 2.6 11/01/2023 1429   MONOABS 0.8 11/01/2023 1429   EOSABS 0.1 11/01/2023 1429   BASOSABS 0.1 11/01/2023 1429   Absolute 03/05/22 - 200     Assessment & Plan:   Discussion: 83 year old female never smoker with multiple pulmonary nodules, CAD s/p DES x 4, HTN, DM2, GERD and hx blood clots secondary to trauma s/p excision who presents for follow-up. Prior bronchoscopy 05/17/22 for PET avid LUL nodule measuring 1.8 cm was negative for malignancy. Despite no evidence of malignancy on biopsy concerned for false negative given PET avidity of nodule. Reviewed CT imaging together including nodules below. We discussed continued surveillance imaging for waxing and waning nodules. Understanding that this could enlarge and that her advanced age my reduce the feasibility for procedures +/- treatment in the future  PET-avid left upper lobe lung nodule measuring 2.0 x 1.5 cm - improved without intervention. Likely infectious/inflammatory --Prior bronchoscopy. No malignancy however recommend continued surveillance based on PET-avidity and size of lung  Subpleural RLL lung nodule --On CT 11/23/22, previously 6 x4 mm, increased to 12 x 6 mm on 06/11/23 --Reviewed imaging from CT A/p 11/08/23 with similar sized RLL lesion. Unlikely malignancy --ORDER CT Chest without contrast in 6 months   Health Maintenance Immunization History  Administered Date(s) Administered   Fluad Quad(high Dose 65+) 04/30/2019, 06/27/2020, 05/12/2021, 07/09/2022   Influenza Split 07/18/2011, 05/19/2012   Influenza Whole 06/22/2009, 05/15/2010   Influenza, High Dose Seasonal PF 07/20/2015, 05/24/2016, 06/18/2018   Influenza,inj,Quad PF,6+ Mos 06/15/2013, 05/21/2014   Influenza-Unspecified 06/24/2017   Pneumococcal Conjugate-13 01/20/2014   Pneumococcal Polysaccharide-23 11/08/2014, 11/13/2017   Tdap 01/20/2014, 05/11/2022   Zoster, Live 07/08/2014   CT Lung Screen - not  qualified due to age  Orders Placed This Encounter  Procedures   CT Chest Wo Contrast    Standing Status:   Future    Expiration Date:   11/20/2024    Scheduling Instructions:     Cancel prior CT Chest order for 11/2023.          Schedule new CT chest order for October 2025    Preferred imaging location?:   [facility]  No orders of the defined types were placed in this encounter.   No follow-ups on file.   I have spent a total time of 35-minutes on the day of the appointment including chart review, data review, collecting history, coordinating care and discussing medical diagnosis and plan with the patient/family. Past medical history, allergies, medications were reviewed. Pertinent imaging, labs and tests included in this note have been reviewed and interpreted independently by me.  Mykiah Schmuck Mechele Collin, MD [facility] 11/21/2023

## 2023-11-21 NOTE — Patient Instructions (Signed)
 PET-avid left upper lobe lung nodule measuring 2.0 x 1.5 cm - improved without intervention. Likely infectious/inflammatory --Prior bronchoscopy. No malignancy however recommend continued surveillance based on PET-avidity and size of lung  Subpleural RLL lung nodule --On CT 11/23/22, previously 6 x4 mm, increased to 12 x 6 mm on 06/11/23 --Reviewed imaging from CT A/p 11/08/23 with similar sized RLL lesion. Unlikely malignancy --ORDER CT Chest without contrast in 6 months

## 2023-12-02 ENCOUNTER — Telehealth: Payer: Self-pay | Admitting: Nurse Practitioner

## 2023-12-02 NOTE — Telephone Encounter (Signed)
 I attempted to contact patient at this time, left a msg on her voicemail stating I will call her again later today to discuss her CTAP results.

## 2023-12-02 NOTE — Telephone Encounter (Signed)
 [name] [name]-  Please see imaging from 11/08/23 that just resulted and advise of recommendations.

## 2023-12-02 NOTE — Telephone Encounter (Signed)
 French Ana from radiology calling about ct results. Please advise. [phone]

## 2023-12-03 ENCOUNTER — Ambulatory Visit: Attending: Physician Assistant | Admitting: Physician Assistant

## 2023-12-03 ENCOUNTER — Encounter: Payer: Self-pay | Admitting: Physician Assistant

## 2023-12-03 ENCOUNTER — Ambulatory Visit (INDEPENDENT_AMBULATORY_CARE_PROVIDER_SITE_OTHER)

## 2023-12-03 VITALS — BP 140/60 | HR 71 | Ht 59.0 in | Wt 126.4 lb

## 2023-12-03 DIAGNOSIS — I2510 Atherosclerotic heart disease of native coronary artery without angina pectoris: Secondary | ICD-10-CM

## 2023-12-03 DIAGNOSIS — R002 Palpitations: Secondary | ICD-10-CM

## 2023-12-03 DIAGNOSIS — E785 Hyperlipidemia, unspecified: Secondary | ICD-10-CM

## 2023-12-03 DIAGNOSIS — Z01818 Encounter for other preprocedural examination: Secondary | ICD-10-CM

## 2023-12-03 DIAGNOSIS — I10 Essential (primary) hypertension: Secondary | ICD-10-CM | POA: Diagnosis not present

## 2023-12-03 DIAGNOSIS — I2583 Coronary atherosclerosis due to lipid rich plaque: Secondary | ICD-10-CM

## 2023-12-03 DIAGNOSIS — E119 Type 2 diabetes mellitus without complications: Secondary | ICD-10-CM

## 2023-12-03 MED ORDER — METOPROLOL SUCCINATE ER 100 MG PO TB24: 100.0000 mg | ORAL_TABLET | Freq: Every day | ORAL | 3 refills | Status: DC

## 2023-12-03 NOTE — Progress Notes (Unsigned)
 ZIO serial # [id number] from office inventory applied to patient.  Dr. Excell Seltzer to read.

## 2023-12-03 NOTE — Patient Instructions (Addendum)
 Medication Instructions:  Your physician has recommended you make the following change in your medication:  STOP IMDUR INCREASE TOPROL XL TO 100 MG DAILY  *If you need a refill on your cardiac medications before your next appointment, please call your pharmacy*  Lab Work: TODAY: FASTING LIPIDS If you have labs (blood work) drawn today and your tests are completely normal, you will receive your results only by: MyChart Message (if you have MyChart) OR A paper copy in the mail If you have any lab test that is abnormal or we need to change your treatment, we will call you to review the results.  Testing/Procedures: Christena Deem- Long Term Monitor Instructions  Your physician has requested you wear a ZIO patch monitor for 14 days.  This is a single patch monitor. [employer] supplies one patch monitor per enrollment. Additional stickers are not available. Please do not apply patch if you will be having a Nuclear Stress Test,  Echocardiogram, Cardiac CT, MRI, or Chest Xray during the period you would be wearing the  monitor. The patch cannot be worn during these tests. You cannot remove and re-apply the  ZIO XT patch monitor.  Your ZIO patch monitor will be mailed 3 day USPS to your address on file. It may take 3-5 days  to receive your monitor after you have been enrolled.  Once you have received your monitor, please review the enclosed instructions. Your monitor  has already been registered assigning a specific monitor serial # to you.  Billing and Patient Assistance Program Information  We have supplied [employer] with any of your insurance information on file for billing purposes. [employer] offers a sliding scale Patient Assistance Program for patients that do not have  insurance, or whose insurance does not completely cover the cost of the ZIO monitor.  You must apply for the Patient Assistance Program to qualify for this discounted rate.  To apply, please call [employer] at [phone], select  option 4, select option 2, ask to apply for  Patient Assistance Program. Meredeth Ide will ask your household income, and how many people  are in your household. They will quote your out-of-pocket cost based on that information.  [employer] will also be able to set up a 45-month, interest-free payment plan if needed.  Applying the monitor   Shave hair from upper left chest.  Hold abrader disc by orange tab. Rub abrader in 40 strokes over the upper left chest as  indicated in your monitor instructions.  Clean area with 4 enclosed alcohol pads. Let dry.  Apply patch as indicated in monitor instructions. Patch will be placed under collarbone on left  side of chest with arrow pointing upward.  Rub patch adhesive wings for 2 minutes. Remove white label marked "1". Remove the white  label marked "2". Rub patch adhesive wings for 2 additional minutes.  While looking in a mirror, press and release button in center of patch. A small green light will  flash 3-4 times. This will be your only indicator that the monitor has been turned on.  Do not shower for the first 24 hours. You may shower after the first 24 hours.  Press the button if you feel a symptom. You will hear a small click. Record Date, Time and  Symptom in the Patient Logbook.  When you are ready to remove the patch, follow instructions on the last 2 pages of Patient  Logbook. Stick patch monitor onto the last page of Patient Logbook.  Place Patient Logbook in the  blue and white box. Use locking tab on box and tape box closed  securely. The blue and white box has prepaid postage on it. Please place it in the mailbox as  soon as possible. Your physician should have your test results approximately 7 days after the  monitor has been mailed back to [facility].  Call [facility] Customer Care at [phone] if you have questions regarding  your ZIO XT patch monitor. Call them immediately if you see an orange light blinking on your  monitor.   If your monitor falls off in less than 4 days, contact our Monitor department at [phone].  If your monitor becomes loose or falls off after 4 days call [employer] at [phone] for  suggestions on securing your monitor   Follow-Up: At [facility], you and your health needs are our priority.  As part of our continuing mission to provide you with exceptional heart care, our providers are all part of one team.  This team includes your primary Cardiologist (physician) and Advanced Practice Providers or APPs (Physician Assistants and Nurse Practitioners) who all work together to provide you with the care you need, when you need it.  Your next appointment:   1 month(s)  Provider:   Jacolyn Reedy, NP  Your next appointment:   6 month(s)  Provider:   Tonny Bollman, MD   We recommend signing up for the patient portal called "MyChart".  Sign up information is provided on this After Visit Summary.  MyChart is used to connect with patients for Virtual Visits (Telemedicine).  Patients are able to view lab/test results, encounter notes, upcoming appointments, etc.  Non-urgent messages can be sent to your provider as well.   To learn more about what you can do with MyChart, go to [email or web address].   Other Instructions       1st Floor: - Lobby - Registration  - Pharmacy  - Lab - Cafe  2nd Floor: - PV Lab - Diagnostic Testing (echo, CT, nuclear med)  3rd Floor: - Vacant  4th Floor: - TCTS (cardiothoracic surgery) - AFib Clinic - Structural Heart Clinic - Vascular Surgery  - Vascular Ultrasound  5th Floor: - HeartCare Cardiology (general and EP) - Clinical Pharmacy for coumadin, hypertension, lipid, weight-loss medications, and med management appointments    Valet parking services will be available as well.

## 2023-12-04 NOTE — Telephone Encounter (Signed)
 See CTAP 11/08/2023 notes.  Patient to follow up with PCP regarding lung lesion.

## 2023-12-05 ENCOUNTER — Other Ambulatory Visit: Payer: Self-pay | Admitting: Nurse Practitioner

## 2023-12-05 DIAGNOSIS — R918 Other nonspecific abnormal finding of lung field: Secondary | ICD-10-CM

## 2023-12-24 ENCOUNTER — Other Ambulatory Visit

## 2023-12-26 ENCOUNTER — Telehealth: Payer: Self-pay | Admitting: *Deleted

## 2023-12-26 NOTE — Telephone Encounter (Signed)
   Pre-operative Risk Assessment    Patient Name: [name] [name]  DOB: 1941-01-30 MRN: [id number]   Date of last office visit: 12/03/2023 Date of next office visit: 01/07/2024   Request for Surgical Clearance    Procedure:   LEFT REVERSE TOTAL SHOULDER ARTHROPLASTY   Date of Surgery:  Clearance TBD                                Surgeon:  DR. KEVIN SUPPLE Surgeon's Group or Practice Name:  [facility] Phone number:  [phone] Fax number:  [phone]   Type of Clearance Requested:   - Medical  - Pharmacy:  Hold Aspirin  and Clopidogrel  (Plavix ) NOT INDICATED   Type of Anesthesia:  General    Additional requests/questions:    Berenda Breaker   12/26/2023, 7:39 AM

## 2023-12-26 NOTE — Telephone Encounter (Signed)
 Pt scheduled to see Theotis Flake, PA-C 01/07/24, clearance will be addressed at that time.  Will route to the requesting surgeon's office to make them aware.

## 2023-12-26 NOTE — Telephone Encounter (Signed)
 Preoperative team, patient has upcoming appointment on 01/07/2024.  I will defer preoperative cardiac evaluation to upcoming appointment.  Please add preoperative cardiac evaluation to appointment notes.  Thank you for your help.  Chet Cota. Wynne Rozak NP-C     12/26/2023, 7:59 AM [facility] [address] Office [phone] Fax [phone]\

## 2023-12-29 DIAGNOSIS — R002 Palpitations: Secondary | ICD-10-CM

## 2023-12-30 ENCOUNTER — Telehealth: Payer: Self-pay | Admitting: *Deleted

## 2023-12-30 MED ORDER — METOPROLOL SUCCINATE ER 25 MG PO TB24: 25.0000 mg | ORAL_TABLET | Freq: Every morning | ORAL | 3 refills | Status: DC

## 2023-12-30 MED ORDER — METOPROLOL SUCCINATE ER 50 MG PO TB24: 50.0000 mg | ORAL_TABLET | Freq: Two times a day (BID) | ORAL | 3 refills | Status: DC

## 2023-12-30 NOTE — Telephone Encounter (Signed)
-----   Message from Theotis Flake sent at 12/30/2023 12:23 PM EDT ----- Let's increase her toprol  xl 75 mg am 50 mg pm. Thanks. Keep f/u with me ----- Message ----- From: Render Carrie, RN Sent: 12/30/2023  10:59 AM EDT To: Flo Hummingbird, PA-C  Spoke with pt, aware of monitor. She reports she continues to have elevated heart rates. It happened last night. She is currently taking metoprolol  succ 100 mg daily. Aware will make michele aware.

## 2023-12-30 NOTE — Progress Notes (Signed)
 Cardiology Office Note:  .   Date:  01/07/2024  ID:  [name] [name], DOB [birth year], MRN [id number] PCP: Vevelyn Gowers, NP  [facility] Providers Cardiologist:  Arnoldo Lapping, MD Cardiology APP:  Gabino Joe, PA-C    History of Present Illness: .   [name] [name] is a 83 y.o. female   with a h/o CAD s/p DES-RCA in 2007, POBA-mRCA, DES-dRCA in 2018, DES-pRCA in 2021, DES-pLAD in 2022, bacterial endocarditis, hypertension, hyperlipidemia, and type 2 diabetes who is pending left reverse shoulder arthroplasty with Dr. Ellard Gunning of [facility]    I saw her 11/05/23 with chest pain, also having stomache pain and colitis. I ordered an NST that was normal. I saw her 12/03/23 complaining of palpitations. 2 week Zio NSR average HR 68/m freq PACs 5%, 54 SVT runs longest 17.9 sec.I had increased toprol  100 mg daily and stopped imdur . She was still feeling palpitations so I increased toprol  125mg  daily.   She  still hasn't had shoulder surgery yet. [name] had 2 fall since last here, loses her balance. No dizziness, chest pain. [name] had a couple palpitations when she is working and has to sit down or when she lays down at night. She is busy packing up to move in with her family.    ROS:    Studies Reviewed: Aaron Aas    EKG Interpretation Date/Time:  Tuesday Jan 07 2024 13:25:35 EDT Ventricular Rate:  64 PR Interval:  178 QRS Duration:  70 QT Interval:  378 QTC Calculation: 389 R Axis:   -32  Text Interpretation: Sinus rhythm with Premature atrial complexes Left axis deviation Cannot rule out Anterior infarct (cited on or before 17-Jun-2023) When compared with ECG of 05-Nov-2023 13:14, Premature atrial complexes are now Present Confirmed by Theotis Flake [phone]) on 01/07/2024 1:35:34 PM    Prior CV Studies:    NST 11/15/23   The study is normal. The study is low risk.   No ST deviation was noted.   LV perfusion is normal. There is no evidence of ischemia. There is no evidence of  infarction.   Left ventricular function is normal. Nuclear stress EF: 71%. The left ventricular ejection fraction is hyperdynamic (>65%). End diastolic cavity size is normal. End systolic cavity size is normal.   Prior study available for comparison from 11/12/2019.   Zio 12/2023 Patch Wear Time:  12 days and 21 hours (2025-04-01T11:15:20-0400 to 2025-04-14T08:58:14-0400)   Patient had a min HR of 54 bpm, max HR of 203 bpm, and avg HR of 68 bpm. Predominant underlying rhythm was Sinus Rhythm. 54 Supraventricular Tachycardia runs occurred, the run with the fastest interval lasting 5 beats with a max rate of 203 bpm, the  longest lasting 17.9 secs with an avg rate of 122 bpm. Isolated SVEs were frequent (5.2%, 61736), SVE Couplets were rare (<1.0%, 1698), and SVE Triplets were rare (<1.0%, 280). Isolated VEs were rare (<1.0%), VE Couplets were rare (<1.0%), and no VE  Triplets were present.    Summary: The basic rhythm is normal sinus with an average heart rate of 68 bpm.  There are occasional, short supraventricular runs.  Frequent PACs are present at a frequency of about 5%.  There are rare ventricular ectopic beats.  There are no sustained arrhythmias.  There is no atrial fibrillation or flutter.  There is no high-grade AV block.       CARDIAC CATHETERIZATION 06/28/2021   Narrative   Mid LAD lesion is 30% stenosed.  Prox Cx to Mid Cx lesion is 30% stenosed.   Mid RCA lesion is 10% stenosed.   Prox LAD to Mid LAD lesion is 70% stenosed.   A drug-eluting stent was successfully placed using a STENT ONYX FRONTIER 3.0X18.   Post intervention, there is a 0% residual stenosis.   Patent mid RCA stents   Severe proximal LAD stenosis that angiographically appears moderate to severe. RFR assessment with pressure wire of 0.88. Successful PTCA/DES x 1 proximal LAD (RFR guided) Mild to moderate stenosis in the non-dominant mid Circumflex Large dominant RCA with patent mid stents without  restenosis Normal LVEDP   Recommendations: Continue DAPT with ASA/Plavix . Continue beta blocker and statin.   Findings Coronary Findings Diagnostic  Dominance: Right   Left Anterior Descending Vessel is large. Prox LAD to Mid LAD lesion is 70% stenosed. Mid LAD lesion is 30% stenosed.   Left Circumflex Prox Cx to Mid Cx lesion is 30% stenosed.   Right Coronary Artery Non-stenotic Prox RCA to Mid RCA lesion was previously treated. Mid RCA lesion is 10% stenosed. The lesion was previously treated using a stent (unknown type) over 2 years ago.   Intervention   Prox LAD to Mid LAD lesion Stent CATH VISTA GUIDE 6FR XBLAD3.5 guide catheter was inserted. Lesion crossed with guidewire. Pre-stent angioplasty was performed using a BALLN SAPPHIRE 2.5X12. A drug-eluting stent was successfully placed using a STENT ONYX FRONTIER 3.0X18. Stent strut is well apposed. Post-stent angioplasty was performed using a BALLN SAPPHIRE [place] 3.25X12. Post-Intervention Lesion Assessment The intervention was successful. Pre-interventional TIMI flow is 3. Post-intervention TIMI flow is 3. No complications occurred at this lesion. There is a 0% residual stenosis post intervention.     CARDIAC CATHETERIZATION   CARDIAC CATHETERIZATION 03/09/2020   Narrative  Mid RCA lesion is 10% stenosed.  Prox RCA to Mid RCA lesion is 95% stenosed.  A drug-eluting stent was successfully placed using a STENT RESOLUTE ONYX [id number].  Post intervention, there is a 0% residual stenosis.  Prox Cx to Mid Cx lesion is 30% stenosed.  Prox LAD to Mid LAD lesion is 50% stenosed.  Mid LAD lesion is 30% stenosed.   1. Large, dominant RCA with patent mid stents. Severe stenosis just prior to the stented segment in the mid vessel. 2. Moderate non-obstructive proximal LAD stenosis 3. Successful PTCA/DES x 1 mid RCA   Recommendations: Continue DAPT with ASA and Plavix . Continue statin and beta blocker.   Findings Coronary  Findings Diagnostic  Dominance: Right   Left Anterior Descending Vessel is large. Prox LAD to Mid LAD lesion is 50% stenosed. Mid LAD lesion is 30% stenosed.   Left Circumflex Prox Cx to Mid Cx lesion is 30% stenosed.   Right Coronary Artery Prox RCA to Mid RCA lesion is 95% stenosed. Mid RCA lesion is 10% stenosed. The lesion was previously treated using a stent (unknown type) over 2 years ago.   Intervention   Prox RCA to Mid RCA lesion Stent Pre-stent angioplasty was performed using a BALLOON SAPPHIRE 2.0X12. A drug-eluting stent was successfully placed using a STENT RESOLUTE ONYX [id number]. Stent strut is well apposed. Post-stent angioplasty was performed using a BALLOON SAPPHIRE  3.0X12. Post-Intervention Lesion Assessment The intervention was successful. Pre-interventional TIMI flow is 3. Post-intervention TIMI flow is 3. No complications occurred at this lesion. There is a 0% residual stenosis post intervention.   STRESS TESTS   MYOCARDIAL PERFUSION IMAGING 11/12/2019   Narrative  Nuclear stress EF: 73%.  There was no  ST segment deviation noted during stress.  No T wave inversion was noted during stress.  The study is normal.  This is a low risk study.  The left ventricular ejection fraction is hyperdynamic (>65%).   Impression:   1. Normal myocardial perfusion imaging study without evidence of ischemia or infarction. 2. Normal LVEF, >65%.   Melodee Spruce T. Rolm Clos, MD [facility] [address], [address] [place], [place] [address] [phone] 3:13 PM   ECHOCARDIOGRAM   ECHOCARDIOGRAM COMPLETE 01/15/2022   Narrative ECHOCARDIOGRAM REPORT       Patient Name:   [name] [name] Date of Exam: 01/15/2022 Medical Rec #:  [id number]       Height:       59.0 in Accession #:    [id number]      Weight:       125.6 lb Date of Birth:  12-Jun-1941       BSA:          1.513 m Patient Age:    81 years        BP:           144/78 mmHg Patient Gender:  F               HR:           66 bpm. Exam Location:  [place]   Procedure: 2D Echo, Cardiac Doppler, Color Doppler and Strain Analysis   Indications:    Dyspnea R06.00   History:        Patient has prior history of Echocardiogram examinations, most recent 05/01/2019. CAD, Signs/Symptoms:Chest Pain; Risk Factors:Dyslipidemia and Hypertension.   Sonographer:    Kristen Petri RDCS Referring Phys: [id number] CAITLIN S WALKER   IMPRESSIONS     1. Left ventricular ejection fraction, by estimation, is 60 to 65%. The left ventricle has normal function. The left ventricle has no regional wall motion abnormalities. There is mild concentric left ventricular hypertrophy. Left ventricular diastolic parameters are consistent with Grade I diastolic dysfunction (impaired relaxation). Elevated left ventricular end-diastolic pressure. The average left ventricular global longitudinal strain is -12.8 %. The global longitudinal strain is abnormal. 2. Right ventricular systolic function is normal. The right ventricular size is normal. There is mildly elevated pulmonary artery systolic pressure. 3. Left atrial size was mildly dilated. 4. The mitral valve is degenerative. Mild mitral valve regurgitation. No evidence of mitral stenosis. 5. The aortic valve is tricuspid. Aortic valve regurgitation is mild. Aortic valve sclerosis/calcification is present, without any evidence of aortic stenosis. 6. Aortic Normal DTA. 7. The inferior vena cava is normal in size with greater than 50% respiratory variability, suggesting right atrial pressure of 3 mmHg.   FINDINGS Left Ventricle: Left ventricular ejection fraction, by estimation, is 60 to 65%. The left ventricle has normal function. The left ventricle has no regional wall motion abnormalities. The average left ventricular global longitudinal strain is -12.8 %. The global longitudinal strain is abnormal. The left ventricular internal cavity size was normal in size. There is  mild concentric left ventricular hypertrophy. Left ventricular diastolic parameters are consistent with Grade I diastolic dysfunction (impaired relaxation). Elevated left ventricular end-diastolic pressure.   Right Ventricle: The right ventricular size is normal. No increase in right ventricular wall thickness. Right ventricular systolic function is normal. There is mildly elevated pulmonary artery systolic pressure. The tricuspid regurgitant velocity is 2.97 m/s, and with an assumed right atrial pressure of 3 mmHg, the estimated right ventricular systolic pressure is 38.3  mmHg.   Left Atrium: Left atrial size was mildly dilated.   Right Atrium: Right atrial size was normal in size.   Pericardium: There is no evidence of pericardial effusion.   Mitral Valve: The mitral valve is degenerative in appearance. Mild mitral annular calcification. Mild mitral valve regurgitation. No evidence of mitral valve stenosis.   Tricuspid Valve: The tricuspid valve is normal in structure. Tricuspid valve regurgitation is mild . No evidence of tricuspid stenosis.   Aortic Valve: The aortic valve is tricuspid. Aortic valve regurgitation is mild. Aortic regurgitation PHT measures 445 msec. Aortic valve sclerosis/calcification is present, without any evidence of aortic stenosis. Aortic valve mean gradient measures 10.0 mmHg. Aortic valve peak gradient measures 18.2 mmHg. Aortic valve area, by VTI measures 2.27 cm.   Pulmonic Valve: The pulmonic valve was normal in structure. Pulmonic valve regurgitation is not visualized. No evidence of pulmonic stenosis.   Aorta: The aortic root and ascending aorta are structurally normal, with no evidence of dilitation, the aortic arch was not well visualized and Normal DTA.   Venous: A normal flow pattern is recorded from the right upper pulmonary vein. The inferior vena cava is normal in size with greater than 50% respiratory variability, suggesting right atrial pressure of 3  mmHg.   IAS/Shunts: No atrial level shunt detected by color flow Doppler.     LEFT VENTRICLE PLAX 2D LVIDd:         3.80 cm   Diastology LVIDs:         2.60 cm   LV e' medial:    5.11 cm/s LV PW:         1.30 cm   LV E/e' medial:  17.3 LV IVS:        1.20 cm   LV e' lateral:   6.74 cm/s LVOT diam:     2.00 cm   LV E/e' lateral: 13.1 LV SV:         107 LV SV Index:   71        2D Longitudinal Strain LVOT Area:     3.14 cm  2D Strain GLS Avg:     -12.8 %     RIGHT VENTRICLE             IVC RV S prime:     11.40 cm/s  IVC diam: 1.10 cm TAPSE (M-mode): 2.1 cm   LEFT ATRIUM             Index        RIGHT ATRIUM           Index LA diam:        3.90 cm 2.58 cm/m   RA Area:     12.80 cm LA Vol (A2C):   44.3 ml 29.27 ml/m  RA Volume:   24.90 ml  16.45 ml/m LA Vol (A4C):   56.5 ml 37.33 ml/m LA Biplane Vol: 52.4 ml 34.62 ml/m AORTIC VALVE AV Area (Vmax):    2.22 cm AV Area (Vmean):   2.32 cm AV Area (VTI):     2.27 cm AV Vmax:           213.50 cm/s AV Vmean:          150.000 cm/s AV VTI:            0.473 m AV Peak Grad:      18.2 mmHg AV Mean Grad:      10.0 mmHg LVOT Vmax:  151.00 cm/s LVOT Vmean:        111.000 cm/s LVOT VTI:          0.342 m LVOT/AV VTI ratio: 0.72 AI PHT:            445 msec   AORTA Ao Root diam: 3.00 cm Ao Asc diam:  2.90 cm Ao Desc diam: 1.90 cm   MITRAL VALVE               TRICUSPID VALVE MV Area (PHT): 4.44 cm    TR Peak grad:   35.3 mmHg MV Decel Time: 171 msec    TR Vmax:        297.00 cm/s MV E velocity: 88.30 cm/s MV A velocity: 79.70 cm/s  SHUNTS MV E/A ratio:  1.11        Systemic VTI:  0.34 m Systemic Diam: 2.00 cm   Zoe Hinds MD Electronically signed by Zoe Hinds MD Signature Date/Time: 01/15/2022/4:55:36 PM       Final    MONITORS   LONG TERM MONITOR (3-14 DAYS) 01/05/2022   Narrative Patch Wear Time:  10 days and 16 hours (2023-04-20T15:22:27-0400 to 2023-05-01T08:10:14-0400)   Patient had a min HR of  53 bpm, max HR of 210 bpm, and avg HR of 74 bpm. Predominant underlying rhythm was Sinus Rhythm. 1 run of Ventricular Tachycardia occurred lasting 5 beats with a max rate of 167 bpm (avg 147 bpm). 74 Supraventricular Tachycardia runs occurred, the run with the fastest interval lasting 6 beats with a max rate of 210 bpm, the longest lasting 12 beats with an avg rate of 130 bpm. Isolated SVEs were occasional (3.1%, 35076), SVE Couplets were rare (<1.0%, 1003), and SVE Triplets were rare (<1.0%, 1163). Isolated VEs were rare (<1.0%, 104), VE Couplets were rare (<1.0%, 3), and no VE Triplets were present.   SUMMARY: findings as outlined above - NSR with an average HR of 74 bpm. No afib or flutter. No bradycardic events. Rare PVC's, occasional PAC's. No sustained arrhythmias.        Risk Assessment/Calculations:             Physical Exam:   VS:  BP (!) 118/54 (BP Location: Left Arm, Patient Position: Sitting, Cuff Size: Normal)   Pulse 64   Ht 4\' 11"  (1.499 m)   Wt 124 lb 6.4 oz (56.4 kg)   SpO2 99%   BMI 25.13 kg/m    Wt Readings from Last 3 Encounters:  01/07/24 124 lb 6.4 oz (56.4 kg)  12/03/23 126 lb 6.4 oz (57.3 kg)  11/21/23 121 lb 1.6 oz (54.9 kg)    GEN: Thin, elderly, in no acute distress NECK: No JVD; No carotid bruits CARDIAC:  RRR, 2/6 systolic murmur LSB RESPIRATORY:  Clear to auscultation without rales, wheezing or rhonchi  ABDOMEN: Soft, non-tender, non-distended EXTREMITIES:  No edema; No deformity   ASSESSMENT AND PLAN: .    Preop clearance for left reverse total shoulder arthroplasty  by Dr. Jackey Mary had a normal NST last month and is an acceptable risk to proceed with above surgery. According to the Revised Cardiac Risk Index (RCRI), her Perioperative Risk of Major Cardiac Event is (%): 0.9  Her Functional Capacity in METs is: 5.07 according to the [employer] Activity Status Index (DASI).   CAD s/p DES-RCA in 2007, [place], DES-dRCA in 2018, DES-pRCA in  2021, DES-pLAD in 2022. NST normal  Palpitations with SVT/PAC's 5%-better on increase metoprolol , still having some palpitations but overall improved,  can take extra metoprolol  25 mg prn if needed  HTN-much better controlled  HLD-FLP still not done  DM2-managed by PCP  History of bacterial endocarditis        Dispo: f/u as scheduled  Signed, Theotis Flake, PA-C

## 2023-12-30 NOTE — Telephone Encounter (Signed)
 Spoke with pt, aware of recommendations. New script sent to the pharmacy aware of follow up appointment.

## 2024-01-07 ENCOUNTER — Ambulatory Visit: Attending: Physician Assistant | Admitting: Physician Assistant

## 2024-01-07 ENCOUNTER — Encounter: Payer: Self-pay | Admitting: Physician Assistant

## 2024-01-07 VITALS — BP 118/54 | HR 64 | Ht 59.0 in | Wt 124.4 lb

## 2024-01-07 DIAGNOSIS — R002 Palpitations: Secondary | ICD-10-CM

## 2024-01-07 DIAGNOSIS — I10 Essential (primary) hypertension: Secondary | ICD-10-CM

## 2024-01-07 DIAGNOSIS — E119 Type 2 diabetes mellitus without complications: Secondary | ICD-10-CM

## 2024-01-07 DIAGNOSIS — E782 Mixed hyperlipidemia: Secondary | ICD-10-CM

## 2024-01-07 DIAGNOSIS — I491 Atrial premature depolarization: Secondary | ICD-10-CM | POA: Diagnosis not present

## 2024-01-07 DIAGNOSIS — I2510 Atherosclerotic heart disease of native coronary artery without angina pectoris: Secondary | ICD-10-CM

## 2024-01-07 DIAGNOSIS — I2583 Coronary atherosclerosis due to lipid rich plaque: Secondary | ICD-10-CM

## 2024-01-07 DIAGNOSIS — Z8679 Personal history of other diseases of the circulatory system: Secondary | ICD-10-CM

## 2024-01-07 DIAGNOSIS — Z01818 Encounter for other preprocedural examination: Secondary | ICD-10-CM | POA: Diagnosis not present

## 2024-01-07 NOTE — Patient Instructions (Signed)
 Medication Instructions:  Your physician recommends that you continue on your current medications as directed. Please refer to the Current Medication list given to you today.  *If you need a refill on your cardiac medications before your next appointment, please call your pharmacy*  Lab Work: NONE If you have labs (blood work) drawn today and your tests are completely normal, you will receive your results only by: MyChart Message (if you have MyChart) OR A paper copy in the mail If you have any lab test that is abnormal or we need to change your treatment, we will call you to review the results.  Testing/Procedures: NONE  Follow-Up: At [facility], you and your health needs are our priority.  As part of our continuing mission to provide you with exceptional heart care, our providers are all part of one team.  This team includes your primary Cardiologist (physician) and Advanced Practice Providers or APPs (Physician Assistants and Nurse Practitioners) who all work together to provide you with the care you need, when you need it.  Your next appointment:   KEEP SCHEDULED FOLLOW-UP  We recommend signing up for the patient portal called "MyChart".  Sign up information is provided on this After Visit Summary.  MyChart is used to connect with patients for Virtual Visits (Telemedicine).  Patients are able to view lab/test results, encounter notes, upcoming appointments, etc.  Non-urgent messages can be sent to your provider as well.   To learn more about what you can do with MyChart, go to [email or web address].   Other Instructions

## 2024-01-29 ENCOUNTER — Emergency Department (HOSPITAL_BASED_OUTPATIENT_CLINIC_OR_DEPARTMENT_OTHER)

## 2024-01-29 ENCOUNTER — Emergency Department (HOSPITAL_BASED_OUTPATIENT_CLINIC_OR_DEPARTMENT_OTHER)
Admission: EM | Admit: 2024-01-29 | Discharge: 2024-01-29 | Disposition: A | Discharge status: Home / Self Care | Attending: Emergency Medicine | Admitting: Emergency Medicine

## 2024-01-29 ENCOUNTER — Encounter (HOSPITAL_BASED_OUTPATIENT_CLINIC_OR_DEPARTMENT_OTHER): Payer: Self-pay

## 2024-01-29 ENCOUNTER — Emergency Department (HOSPITAL_BASED_OUTPATIENT_CLINIC_OR_DEPARTMENT_OTHER): Admitting: Radiology

## 2024-01-29 ENCOUNTER — Other Ambulatory Visit: Payer: Self-pay

## 2024-01-29 DIAGNOSIS — Z79899 Other long term (current) drug therapy: Secondary | ICD-10-CM | POA: Insufficient documentation

## 2024-01-29 DIAGNOSIS — Z7901 Long term (current) use of anticoagulants: Secondary | ICD-10-CM | POA: Diagnosis not present

## 2024-01-29 DIAGNOSIS — E119 Type 2 diabetes mellitus without complications: Secondary | ICD-10-CM | POA: Insufficient documentation

## 2024-01-29 DIAGNOSIS — Z7982 Long term (current) use of aspirin: Secondary | ICD-10-CM | POA: Insufficient documentation

## 2024-01-29 DIAGNOSIS — I2510 Atherosclerotic heart disease of native coronary artery without angina pectoris: Secondary | ICD-10-CM | POA: Insufficient documentation

## 2024-01-29 DIAGNOSIS — M7989 Other specified soft tissue disorders: Secondary | ICD-10-CM | POA: Insufficient documentation

## 2024-01-29 DIAGNOSIS — I10 Essential (primary) hypertension: Secondary | ICD-10-CM | POA: Insufficient documentation

## 2024-01-29 LAB — CBC WITH DIFFERENTIAL/PLATELET
Abs Immature Granulocytes: 0.02 10*3/uL (ref 0.00–0.07)
Basophils Absolute: 0.1 10*3/uL (ref 0.0–0.1)
Basophils Relative: 1 %
Eosinophils Absolute: 0.2 10*3/uL (ref 0.0–0.5)
Eosinophils Relative: 2 %
HCT: 38.7 % (ref 36.0–46.0)
Hemoglobin: 12.7 g/dL (ref 12.0–15.0)
Immature Granulocytes: 0 %
Lymphocytes Relative: 32 %
Lymphs Abs: 2.8 10*3/uL (ref 0.7–4.0)
MCH: 31.4 pg (ref 26.0–34.0)
MCHC: 32.8 g/dL (ref 30.0–36.0)
MCV: 95.8 fL (ref 80.0–100.0)
Monocytes Absolute: 0.9 10*3/uL (ref 0.1–1.0)
Monocytes Relative: 10 %
Neutro Abs: 4.8 10*3/uL (ref 1.7–7.7)
Neutrophils Relative %: 55 %
Platelets: 277 10*3/uL (ref 150–400)
RBC: 4.04 MIL/uL (ref 3.87–5.11)
RDW: 13.2 % (ref 11.5–15.5)
WBC: 8.7 10*3/uL (ref 4.0–10.5)
nRBC: 0 % (ref 0.0–0.2)

## 2024-01-29 LAB — BASIC METABOLIC PANEL WITH GFR
Anion gap: 11 (ref 5–15)
BUN: 13 mg/dL (ref 8–23)
CO2: 27 mmol/L (ref 22–32)
Calcium: 9.2 mg/dL (ref 8.9–10.3)
Chloride: 99 mmol/L (ref 98–111)
Creatinine, Ser: 0.82 mg/dL (ref 0.44–1.00)
GFR, Estimated: >60 mL/min (ref >60)
Glucose, Bld: 129 mg/dL — ABNORMAL HIGH (ref 70–99)
Potassium: 3.8 mmol/L (ref 3.5–5.1)
Sodium: 138 mmol/L (ref 135–145)

## 2024-01-29 MED ORDER — ACETAMINOPHEN 500 MG PO TABS
1000.0000 mg | ORAL_TABLET | Freq: Once | ORAL | Status: AC
  Administered 2024-01-29: 1000 mg via ORAL
  Filled 2024-01-29: qty 2

## 2024-01-29 NOTE — ED Triage Notes (Signed)
 Pt arrives ambulatory to ED with c/o pain and swelling to LLE over the last week. Pt is on a blood thinner. Denies any recent injury to the area.

## 2024-01-29 NOTE — Discharge Instructions (Addendum)
 Your x-ray showed some swelling around the knee.  However no fracture and all the hardware is in place.  Ultrasound did not show any evidence of blood clot.  Follow-up with your primary care doctor as well as your orthopedist.  Return for emergent symptoms.  Take Tylenol  1000 mg every 6 hours as you need to for pain control. You can elevate your legs at night to help with the swelling. If you develop chest pain, shortness of breath please return to the emergency department.

## 2024-01-29 NOTE — ED Provider Notes (Signed)
 [place] EMERGENCY DEPARTMENT AT [facility] Provider Note   CSN: [id number] Arrival date & time: 01/29/24  1031     History  Chief Complaint  Patient presents with   Leg Pain    [name] [name] is a 83 y.o. female.  83 year old female with significant past medical history presents today for concern of left lower extremity swelling that has been present for the past week.  She states she did have a fall where she fell directly onto her left knee.  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  She states she has minimal swelling on the right but significantly worse on the left which is new since the fall.  She states she also feels a knot in her calf which is new.  Denies chest pain, shortness of breath.  Denies any other concerns.  Is able to ambulate without issue.  The history is provided by the patient. No language interpreter was used.       Home Medications Prior to Admission medications   Medication Sig Start Date End Date Taking? Authorizing Provider  acetaminophen  (TYLENOL ) 325 MG tablet Take 650 mg by mouth every 6 (six) hours as needed for moderate pain.    [provider]  amLODipine  (NORVASC ) 5 MG tablet Take 1 tablet (5 mg total) by mouth daily. 11/07/21   Adelia Homestead, MD  Ascorbic Acid  (VITAMIN C ) 500 MG CAPS Take 500 mg by mouth in the morning and at bedtime.    [provider]  aspirin  EC 81 MG tablet Take 81 mg by mouth every morning.     [provider]  atorvastatin  (LIPITOR ) 80 MG tablet TAKE ONE TABLET BY MOUTH EVERYDAY AT BEDTIME 08/06/22   Arcadio Knuckles, MD  Cinnamon 500 MG capsule Take 2 capsules twice a day by oral route as directed.    [provider]  clopidogrel  (PLAVIX ) 75 MG tablet TAKE ONE CAPSULE BY MOUTH EVERY MORNING 08/06/22   Arcadio Knuckles, MD  dicyclomine  (BENTYL ) 10 MG capsule Take 10 mg by mouth 3 (three) times daily.    [provider]  ezetimibe  (ZETIA ) 10 MG tablet TAKE ONE  TABLET BY MOUTH EVERY MORNING 08/06/22   Arcadio Knuckles, MD  famotidine  (PEPCID ) 40 MG tablet Take 40 mg by mouth 2 (two) times daily. 12/06/22   [provider]  gabapentin  (NEURONTIN ) 300 MG capsule Take 300 mg by mouth 3 (three) times daily.    [provider]  losartan  (COZAAR ) 25 MG tablet Take 1 tablet (25 mg total) by mouth daily. 02/12/22   Clearnce Curia, NP  metoprolol  succinate (TOPROL -XL) 50 MG 24 hr tablet Take 1 tablet (50 mg total) by mouth 2 (two) times daily. Take with or immediately following a meal. 12/30/23   Flo Hummingbird, PA-C  montelukast (SINGULAIR) 10 MG tablet Take 10 mg by mouth at bedtime. 02/07/23   [provider]  nitroGLYCERIN  (NITROSTAT ) 0.4 MG SL tablet DISSOLVE 1 TABLET UNDER THE TONGUE EVERY 5 MINUTES AS NEEDED FOR CHEST PAIN. DO NOT EXCEED A TOTAL OF 3 DOSES IN 15 MINUTES. 11/29/21   Arcadio Knuckles, MD  Omega-3 Fatty Acids (FISH OIL) 1000 MG CAPS Take 1,000 mg by mouth in the morning and at bedtime.    [provider]  ondansetron  (ZOFRAN ) 4 MG tablet Take 4 mg by mouth every 8 (eight) hours as needed for vomiting or nausea. 02/07/23   [provider]  pantoprazole  (PROTONIX ) 40  MG tablet Take 40 mg by mouth 2 (two) times daily.    [provider]  Psyllium (DAILY FIBER PO) Take 1 capsule by mouth daily.    [provider]  venlafaxine  XR (EFFEXOR -XR) 37.5 MG 24 hr capsule Take 37.5 mg by mouth every morning. 02/07/23   [provider]      Allergies    Macrobid [nitrofurantoin], Sulfa  antibiotics, Niacin  and related, Codeine, Prednisone, Rocephin  [ceftriaxone ], Sulfonamide derivatives, and Tetracycline    Review of Systems   Review of Systems  Constitutional:  Negative for chills and fever.  Respiratory:  Negative for shortness of breath.   Cardiovascular:  Positive for leg swelling. Negative for chest pain.  Musculoskeletal:  Positive for arthralgias and myalgias.  All other systems  reviewed and are negative.   Physical Exam Updated Vital Signs BP (!) 151/70   Pulse 72   Temp 99.2 F (37.3 C) (Oral)   Resp 16   Ht 5' (1.524 m)   Wt 55.3 kg   SpO2 96%   BMI 23.83 kg/m  Physical Exam Vitals and nursing note reviewed.  Constitutional:      General: She is not in acute distress.    Appearance: Normal appearance. She is not ill-appearing.  HENT:     Head: Normocephalic and atraumatic.     Nose: Nose normal.  Eyes:     Conjunctiva/sclera: Conjunctivae normal.  Cardiovascular:     Rate and Rhythm: Normal rate and regular rhythm.  Pulmonary:     Effort: Pulmonary effort is normal. No respiratory distress.  Musculoskeletal:        General: No deformity. Normal range of motion.     Cervical back: Normal range of motion.     Comments: Good range of motion in all joints of bilateral lower extremities as well as upper extremities.  Good strength.  Ambulates without issue.  Neurovascularly intact.  2+ pitting edema on the left lower extremity.  Trace pitting edema on the right.  Skin:    Findings: No rash.  Neurological:     Mental Status: She is alert.     ED Results / Procedures / Treatments   Labs (all labs ordered are listed, but only abnormal results are displayed) Labs Reviewed - No data to display  EKG None  Radiology No results found.  Procedures Procedures    Medications Ordered in ED Medications  acetaminophen  (TYLENOL ) tablet 1,000 mg (has no administration in time range)    ED Course/ Medical Decision Making/ A&P                                 Medical Decision Making Amount and/or Complexity of Data Reviewed Labs: ordered. Radiology: ordered.  Risk OTC drugs.   Medical Decision Making / ED Course   This patient presents to the ED for concern of left leg pain and swelling, this involves an extensive number of treatment options, and is a complaint that carries with it a high risk of complications and morbidity.  The  differential diagnosis includes fracture, contusion, DVT  MDM: 83 year old female presents today for concern of leg pain.  No chest pain or shortness of breath.  Asymmetrical swelling. Will order DVT study, and knee x-ray given she has hardware in the left knee joint.  X-ray with evidence of some anterior knee swelling otherwise no acute process.  DVT study negative. Remains without concerning symptoms.  Has good range of  motion and ambulates without difficulty.  Discharged in stable condition.  Return precautions discussed.  Patient voices understanding and is in agreement with plan.  Lab Tests: -I ordered, reviewed, and interpreted labs.   The pertinent results include:   Labs Reviewed - No data to display    EKG  EKG Interpretation Date/Time:    Ventricular Rate:    PR Interval:    QRS Duration:    QT Interval:    QTC Calculation:   R Axis:      Text Interpretation:           Imaging Studies ordered: I ordered imaging studies including left knee x-ray, left lower extremity DVT study I independently visualized and interpreted imaging. I agree with the radiologist interpretation   Medicines ordered and prescription drug management: Meds ordered this encounter  Medications   acetaminophen  (TYLENOL ) tablet 1,000 mg    -I have reviewed the patients home medicines and have made adjustments as needed  Social Determinants of Health:  Factors impacting patients care include: Good outpatient follow-up   Reevaluation: After the interventions noted above, I reevaluated the patient and found that they have :improved  Co morbidities that complicate the patient evaluation  Past Medical History:  Diagnosis Date   Allergy    Anal fissure    Anxiety    CAD (coronary artery disease)    s/p inf MI 2007 - tx w/ DES to RCA // s/p POBA to [facility] and DES to dRCA in 11/18 ([facility] in [place], [place]) // Myoview  3/21: low risk // s/p DES to pRCA   Cataract    removed both  eyes   Cerebrovascular disease    Carotid US  09/2017 [facility]): mild plaque (<50%) in both carotid arteries   Chronic neck pain    Chronic pain syndrome    Diabetes Mellitus, Type 2    Diverticular disease    DJD (degenerative joint disease)    Echocardiogram 10/2018    Echo 10/2018: EF 55-60, normal RVSF, mod MAC, mod TR, severe AoV calcification and sclerosis with nodular calcium /mobile area of calcium  in the LVOT (small veg vs Lambl's excrescence  - consider TEE), mild AI, mild AS (mean 11).    Echocardiogram 04/2019    Echocardiogram 04/2019: EF 60-65, basal septal hypertrophy, grade 2 diastolic dysfunction, normal wall motion, normal RV SF, mild LAE, mod MAC, trivial MR, mod sclerosis of the aortic valve with mod aortic annular calcification, thin mobile filamentous structure on ventricular side of AV likely representing Lambl's excrescence, mild AI, mild TR   Frequent UTI's    Gastroesophageal reflux disease    H/O bacterial endocarditis    Rx w IV Abxs in 2020 ([facility] in [place], [place]) // F/u echo with AoV Lambl's excrescence   H/O hiatal hernia    HTN (hypertension)    Hx of fall 10/2020   Hx of MI 2007   Hx of Stroke    IBS (irritable bowel syndrome)    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 (Cervical Fusion)   Premature ventricular contractions    Tubular adenoma of colon    Ulcer    sam  [facility]   Urinary incontinence       Dispostion: Discharged in stable condition.  Return precautions discussed.  Final Clinical Impression(s) / ED Diagnoses Final diagnoses:  Leg swelling    Rx / DC Orders ED Discharge Orders     None         Lucina Sabal, PA-C 01/29/24 1619     Rosealee Concha, MD 01/29/24 2051

## 2024-01-30 NOTE — Telephone Encounter (Signed)
 Received faxed from [facility] for the same clearance. Will route all notes for the clearance to the surgeons office.

## 2024-02-12 ENCOUNTER — Emergency Department (HOSPITAL_COMMUNITY)

## 2024-02-12 ENCOUNTER — Encounter (HOSPITAL_COMMUNITY): Payer: Self-pay

## 2024-02-12 ENCOUNTER — Other Ambulatory Visit: Payer: Self-pay

## 2024-02-12 ENCOUNTER — Inpatient Hospital Stay (HOSPITAL_COMMUNITY)

## 2024-02-12 ENCOUNTER — Inpatient Hospital Stay (HOSPITAL_COMMUNITY)
Admission: EM | Admit: 2024-02-12 | Discharge: 2024-02-19 | DRG: 871 | Disposition: A | Discharge status: Skilled Nursing Facility | Attending: Internal Medicine | Admitting: Internal Medicine

## 2024-02-12 DIAGNOSIS — N059 Unspecified nephritic syndrome with unspecified morphologic changes: Secondary | ICD-10-CM | POA: Diagnosis present

## 2024-02-12 DIAGNOSIS — I10 Essential (primary) hypertension: Secondary | ICD-10-CM | POA: Diagnosis present

## 2024-02-12 DIAGNOSIS — N390 Urinary tract infection, site not specified: Principal | ICD-10-CM | POA: Diagnosis present

## 2024-02-12 DIAGNOSIS — E1122 Type 2 diabetes mellitus with diabetic chronic kidney disease: Secondary | ICD-10-CM | POA: Diagnosis present

## 2024-02-12 DIAGNOSIS — Z66 Do not resuscitate: Secondary | ICD-10-CM | POA: Diagnosis present

## 2024-02-12 DIAGNOSIS — E43 Unspecified severe protein-calorie malnutrition: Secondary | ICD-10-CM | POA: Diagnosis present

## 2024-02-12 DIAGNOSIS — I130 Hypertensive heart and chronic kidney disease with heart failure and stage 1 through stage 4 chronic kidney disease, or unspecified chronic kidney disease: Secondary | ICD-10-CM | POA: Diagnosis present

## 2024-02-12 DIAGNOSIS — R0681 Apnea, not elsewhere classified: Secondary | ICD-10-CM | POA: Diagnosis present

## 2024-02-12 DIAGNOSIS — Z87440 Personal history of urinary (tract) infections: Secondary | ICD-10-CM

## 2024-02-12 DIAGNOSIS — Z800 Family history of malignant neoplasm of digestive organs: Secondary | ICD-10-CM

## 2024-02-12 DIAGNOSIS — Z9049 Acquired absence of other specified parts of digestive tract: Secondary | ICD-10-CM

## 2024-02-12 DIAGNOSIS — R509 Fever, unspecified: Secondary | ICD-10-CM | POA: Diagnosis not present

## 2024-02-12 DIAGNOSIS — Z7902 Long term (current) use of antithrombotics/antiplatelets: Secondary | ICD-10-CM

## 2024-02-12 DIAGNOSIS — R42 Dizziness and giddiness: Secondary | ICD-10-CM

## 2024-02-12 DIAGNOSIS — Z882 Allergy status to sulfonamides status: Secondary | ICD-10-CM

## 2024-02-12 DIAGNOSIS — R531 Weakness: Secondary | ICD-10-CM | POA: Diagnosis present

## 2024-02-12 DIAGNOSIS — I5032 Chronic diastolic (congestive) heart failure: Secondary | ICD-10-CM | POA: Diagnosis present

## 2024-02-12 DIAGNOSIS — Z881 Allergy status to other antibiotic agents status: Secondary | ICD-10-CM

## 2024-02-12 DIAGNOSIS — I341 Nonrheumatic mitral (valve) prolapse: Secondary | ICD-10-CM | POA: Diagnosis present

## 2024-02-12 DIAGNOSIS — R6520 Severe sepsis without septic shock: Secondary | ICD-10-CM | POA: Diagnosis present

## 2024-02-12 DIAGNOSIS — R911 Solitary pulmonary nodule: Secondary | ICD-10-CM | POA: Diagnosis present

## 2024-02-12 DIAGNOSIS — Z955 Presence of coronary angioplasty implant and graft: Secondary | ICD-10-CM

## 2024-02-12 DIAGNOSIS — Z8673 Personal history of transient ischemic attack (TIA), and cerebral infarction without residual deficits: Secondary | ICD-10-CM

## 2024-02-12 DIAGNOSIS — Z79899 Other long term (current) drug therapy: Secondary | ICD-10-CM

## 2024-02-12 DIAGNOSIS — Z90710 Acquired absence of both cervix and uterus: Secondary | ICD-10-CM

## 2024-02-12 DIAGNOSIS — R5381 Other malaise: Secondary | ICD-10-CM | POA: Diagnosis present

## 2024-02-12 DIAGNOSIS — I2510 Atherosclerotic heart disease of native coronary artery without angina pectoris: Secondary | ICD-10-CM | POA: Diagnosis present

## 2024-02-12 DIAGNOSIS — Z1152 Encounter for screening for COVID-19: Secondary | ICD-10-CM

## 2024-02-12 DIAGNOSIS — Z7901 Long term (current) use of anticoagulants: Secondary | ICD-10-CM

## 2024-02-12 DIAGNOSIS — I472 Ventricular tachycardia, unspecified: Secondary | ICD-10-CM | POA: Diagnosis present

## 2024-02-12 DIAGNOSIS — Z802 Family history of malignant neoplasm of other respiratory and intrathoracic organs: Secondary | ICD-10-CM

## 2024-02-12 DIAGNOSIS — K219 Gastro-esophageal reflux disease without esophagitis: Secondary | ICD-10-CM | POA: Diagnosis present

## 2024-02-12 DIAGNOSIS — I482 Chronic atrial fibrillation, unspecified: Secondary | ICD-10-CM | POA: Diagnosis present

## 2024-02-12 DIAGNOSIS — G9341 Metabolic encephalopathy: Secondary | ICD-10-CM | POA: Diagnosis present

## 2024-02-12 DIAGNOSIS — Z885 Allergy status to narcotic agent status: Secondary | ICD-10-CM

## 2024-02-12 DIAGNOSIS — Z801 Family history of malignant neoplasm of trachea, bronchus and lung: Secondary | ICD-10-CM

## 2024-02-12 DIAGNOSIS — F419 Anxiety disorder, unspecified: Secondary | ICD-10-CM | POA: Diagnosis present

## 2024-02-12 DIAGNOSIS — Z860101 Personal history of adenomatous and serrated colon polyps: Secondary | ICD-10-CM

## 2024-02-12 DIAGNOSIS — E119 Type 2 diabetes mellitus without complications: Secondary | ICD-10-CM

## 2024-02-12 DIAGNOSIS — E782 Mixed hyperlipidemia: Secondary | ICD-10-CM | POA: Diagnosis present

## 2024-02-12 DIAGNOSIS — Z7982 Long term (current) use of aspirin: Secondary | ICD-10-CM

## 2024-02-12 DIAGNOSIS — R5383 Other fatigue: Secondary | ICD-10-CM | POA: Diagnosis present

## 2024-02-12 DIAGNOSIS — Z888 Allergy status to other drugs, medicaments and biological substances status: Secondary | ICD-10-CM

## 2024-02-12 DIAGNOSIS — G8929 Other chronic pain: Secondary | ICD-10-CM | POA: Diagnosis present

## 2024-02-12 DIAGNOSIS — Z8249 Family history of ischemic heart disease and other diseases of the circulatory system: Secondary | ICD-10-CM

## 2024-02-12 DIAGNOSIS — Z6824 Body mass index (BMI) 24.0-24.9, adult: Secondary | ICD-10-CM

## 2024-02-12 DIAGNOSIS — N1832 Chronic kidney disease, stage 3b: Secondary | ICD-10-CM | POA: Diagnosis present

## 2024-02-12 DIAGNOSIS — I25119 Atherosclerotic heart disease of native coronary artery with unspecified angina pectoris: Secondary | ICD-10-CM | POA: Diagnosis not present

## 2024-02-12 DIAGNOSIS — A419 Sepsis, unspecified organism: Secondary | ICD-10-CM | POA: Diagnosis present

## 2024-02-12 DIAGNOSIS — N179 Acute kidney failure, unspecified: Secondary | ICD-10-CM | POA: Diagnosis present

## 2024-02-12 DIAGNOSIS — I959 Hypotension, unspecified: Secondary | ICD-10-CM | POA: Diagnosis present

## 2024-02-12 DIAGNOSIS — I3139 Other pericardial effusion (noninflammatory): Secondary | ICD-10-CM | POA: Diagnosis present

## 2024-02-12 DIAGNOSIS — Z83719 Family history of colon polyps, unspecified: Secondary | ICD-10-CM

## 2024-02-12 DIAGNOSIS — Z96652 Presence of left artificial knee joint: Secondary | ICD-10-CM | POA: Diagnosis present

## 2024-02-12 DIAGNOSIS — E876 Hypokalemia: Secondary | ICD-10-CM | POA: Diagnosis present

## 2024-02-12 LAB — I-STAT CHEM 8, ED
BUN: 28 mg/dL — ABNORMAL HIGH (ref 8–23)
Calcium, Ion: 1.04 mmol/L — ABNORMAL LOW (ref 1.15–1.40)
Chloride: 94 mmol/L — ABNORMAL LOW (ref 98–111)
Creatinine, Ser: 1.2 mg/dL — ABNORMAL HIGH (ref 0.44–1.00)
Glucose, Bld: 333 mg/dL — ABNORMAL HIGH (ref 70–99)
HCT: 45 % (ref 36.0–46.0)
Hemoglobin: 15.3 g/dL — ABNORMAL HIGH (ref 12.0–15.0)
Potassium: 4.3 mmol/L (ref 3.5–5.1)
Sodium: 132 mmol/L — ABNORMAL LOW (ref 135–145)
TCO2: 24 mmol/L (ref 22–32)

## 2024-02-12 LAB — RESP PANEL BY RT-PCR (RSV, FLU A&B, COVID)  RVPGX2
Influenza A by PCR: NEGATIVE
Influenza B by PCR: NEGATIVE
Resp Syncytial Virus by PCR: NEGATIVE
SARS Coronavirus 2 by RT PCR: NEGATIVE

## 2024-02-12 LAB — CBC WITH DIFFERENTIAL/PLATELET
Abs Immature Granulocytes: 0.07 10*3/uL (ref 0.00–0.07)
Basophils Absolute: 0.1 10*3/uL (ref 0.0–0.1)
Basophils Relative: 1 %
Eosinophils Absolute: 0 10*3/uL (ref 0.0–0.5)
Eosinophils Relative: 0 %
HCT: 44.1 % (ref 36.0–46.0)
Hemoglobin: 13.9 g/dL (ref 12.0–15.0)
Immature Granulocytes: 1 %
Lymphocytes Relative: 6 %
Lymphs Abs: 0.8 10*3/uL (ref 0.7–4.0)
MCH: 31 pg (ref 26.0–34.0)
MCHC: 31.5 g/dL (ref 30.0–36.0)
MCV: 98.4 fL (ref 80.0–100.0)
Monocytes Absolute: 0.8 10*3/uL (ref 0.1–1.0)
Monocytes Relative: 6 %
Neutro Abs: 10.9 10*3/uL — ABNORMAL HIGH (ref 1.7–7.7)
Neutrophils Relative %: 86 %
Platelets: 233 10*3/uL (ref 150–400)
RBC: 4.48 MIL/uL (ref 3.87–5.11)
RDW: 13.2 % (ref 11.5–15.5)
WBC: 12.6 10*3/uL — ABNORMAL HIGH (ref 4.0–10.5)
nRBC: 0 % (ref 0.0–0.2)

## 2024-02-12 LAB — COMPREHENSIVE METABOLIC PANEL WITH GFR
ALT: 17 U/L (ref 0–44)
AST: 22 U/L (ref 15–41)
Albumin: 3 g/dL — ABNORMAL LOW (ref 3.5–5.0)
Alkaline Phosphatase: 72 U/L (ref 38–126)
Anion gap: 12 (ref 5–15)
BUN: 25 mg/dL — ABNORMAL HIGH (ref 8–23)
CO2: 22 mmol/L (ref 22–32)
Calcium: 8.4 mg/dL — ABNORMAL LOW (ref 8.9–10.3)
Chloride: 97 mmol/L — ABNORMAL LOW (ref 98–111)
Creatinine, Ser: 1.3 mg/dL — ABNORMAL HIGH (ref 0.44–1.00)
GFR, Estimated: 41 mL/min — ABNORMAL LOW (ref >60)
Glucose, Bld: 330 mg/dL — ABNORMAL HIGH (ref 70–99)
Potassium: 4.4 mmol/L (ref 3.5–5.1)
Sodium: 131 mmol/L — ABNORMAL LOW (ref 135–145)
Total Bilirubin: 0.8 mg/dL (ref 0.0–1.2)
Total Protein: 6.6 g/dL (ref 6.5–8.1)

## 2024-02-12 LAB — URINALYSIS, W/ REFLEX TO CULTURE (INFECTION SUSPECTED)
Bilirubin Urine: NEGATIVE
Glucose, UA: 50 mg/dL — AB
Hgb urine dipstick: NEGATIVE
Ketones, ur: 5 mg/dL — AB
Nitrite: POSITIVE — AB
Protein, ur: 100 mg/dL — AB
Specific Gravity, Urine: 1.015 (ref 1.005–1.030)
pH: 5 (ref 5.0–8.0)

## 2024-02-12 LAB — HEMOGLOBIN A1C
Hgb A1c MFr Bld: 7.3 % — ABNORMAL HIGH (ref 4.8–5.6)
Mean Plasma Glucose: 162.81 mg/dL

## 2024-02-12 LAB — TROPONIN I (HIGH SENSITIVITY): Troponin I (High Sensitivity): 35 ng/L — ABNORMAL HIGH (ref <18)

## 2024-02-12 LAB — C-REACTIVE PROTEIN: CRP: 13.3 mg/dL — ABNORMAL HIGH (ref <1.0)

## 2024-02-12 LAB — GLUCOSE, CAPILLARY
Glucose-Capillary: 228 mg/dL — ABNORMAL HIGH (ref 70–99)
Glucose-Capillary: 171 mg/dL — ABNORMAL HIGH (ref 70–99)

## 2024-02-12 LAB — PROTIME-INR
INR: 1.2 (ref 0.8–1.2)
Prothrombin Time: 15.7 s — ABNORMAL HIGH (ref 11.4–15.2)

## 2024-02-12 LAB — BRAIN NATRIURETIC PEPTIDE: B Natriuretic Peptide: 164.4 pg/mL — ABNORMAL HIGH (ref 0.0–100.0)

## 2024-02-12 LAB — PROCALCITONIN: Procalcitonin: 25.08 ng/mL

## 2024-02-12 LAB — CG4 I-STAT (LACTIC ACID): Lactic Acid, Venous: 4 mmol/L (ref 0.5–1.9)

## 2024-02-12 LAB — LACTIC ACID, PLASMA: Lactic Acid, Venous: 1.4 mmol/L (ref 0.5–1.9)

## 2024-02-12 LAB — I-STAT CG4 LACTIC ACID, ED: Lactic Acid, Venous: 2 mmol/L (ref 0.5–1.9)

## 2024-02-12 MED ORDER — METOPROLOL SUCCINATE ER 25 MG PO TB24: 25.0000 mg | ORAL_TABLET | Freq: Two times a day (BID) | ORAL | Status: DC

## 2024-02-12 MED ORDER — ACETAMINOPHEN 650 MG RE SUPP
650.0000 mg | Freq: Four times a day (QID) | RECTAL | Status: AC | PRN
Start: 2024-02-12 — End: ?

## 2024-02-12 MED ORDER — LACTATED RINGERS IV SOLN: INTRAVENOUS | Status: DC

## 2024-02-12 MED ORDER — ATORVASTATIN CALCIUM 80 MG PO TABS
80.0000 mg | ORAL_TABLET | Freq: Every day | ORAL | Status: DC
  Administered 2024-02-12 – 2024-02-19 (×8): 80 mg via ORAL
  Filled 2024-02-12 (×8): qty 1

## 2024-02-12 MED ORDER — SODIUM CHLORIDE 0.9 % IV SOLN
1.0000 g | Freq: Three times a day (TID) | INTRAVENOUS | Status: DC
  Administered 2024-02-12 – 2024-02-13 (×2): 1 g via INTRAVENOUS
  Filled 2024-02-12 (×4): qty 5

## 2024-02-12 MED ORDER — VANCOMYCIN HCL IN DEXTROSE 1-5 GM/200ML-% IV SOLN: 1000.0000 mg | INTRAVENOUS | Status: DC

## 2024-02-12 MED ORDER — LACTATED RINGERS IV BOLUS
1000.0000 mL | Freq: Once | INTRAVENOUS | Status: AC
  Administered 2024-02-12: 1000 mL via INTRAVENOUS

## 2024-02-12 MED ORDER — IOHEXOL 350 MG/ML SOLN
65.0000 mL | Freq: Once | INTRAVENOUS | Status: AC | PRN
  Administered 2024-02-12: 65 mL via INTRAVENOUS

## 2024-02-12 MED ORDER — CLOPIDOGREL BISULFATE 75 MG PO TABS
75.0000 mg | ORAL_TABLET | Freq: Every morning | ORAL | Status: DC
  Administered 2024-02-13 – 2024-02-19 (×7): 75 mg via ORAL
  Filled 2024-02-12 (×7): qty 1

## 2024-02-12 MED ORDER — METRONIDAZOLE 500 MG/100ML IV SOLN
500.0000 mg | Freq: Once | INTRAVENOUS | Status: AC
Start: 2024-02-12 — End: 2024-02-12
  Administered 2024-02-12: 500 mg via INTRAVENOUS
  Filled 2024-02-12: qty 100

## 2024-02-12 MED ORDER — INSULIN ASPART 100 UNIT/ML IJ SOLN
0.0000 [IU] | Freq: Three times a day (TID) | INTRAMUSCULAR | Status: DC
  Administered 2024-02-13 – 2024-02-14 (×5): 2 [IU] via SUBCUTANEOUS
  Administered 2024-02-14 – 2024-02-15 (×2): 3 [IU] via SUBCUTANEOUS
  Administered 2024-02-15: 2 [IU] via SUBCUTANEOUS
  Administered 2024-02-15: 5 [IU] via SUBCUTANEOUS
  Administered 2024-02-16: 2 [IU] via SUBCUTANEOUS
  Administered 2024-02-16: 3 [IU] via SUBCUTANEOUS
  Administered 2024-02-16 – 2024-02-17 (×2): 2 [IU] via SUBCUTANEOUS
  Administered 2024-02-17: 3 [IU] via SUBCUTANEOUS
  Administered 2024-02-17 – 2024-02-18 (×2): 2 [IU] via SUBCUTANEOUS
  Administered 2024-02-18: 3 [IU] via SUBCUTANEOUS
  Administered 2024-02-18 – 2024-02-19 (×3): 2 [IU] via SUBCUTANEOUS

## 2024-02-12 MED ORDER — EZETIMIBE 10 MG PO TABS
10.0000 mg | ORAL_TABLET | Freq: Every morning | ORAL | Status: DC
  Administered 2024-02-13 – 2024-02-19 (×7): 10 mg via ORAL
  Filled 2024-02-12 (×7): qty 1

## 2024-02-12 MED ORDER — METOPROLOL TARTRATE 5 MG/5ML IV SOLN: 5.0000 mg | Freq: Once | INTRAVENOUS | Status: DC

## 2024-02-12 MED ORDER — VANCOMYCIN HCL IN DEXTROSE 1-5 GM/200ML-% IV SOLN
1000.0000 mg | Freq: Once | INTRAVENOUS | Status: AC
Start: 2024-02-12 — End: 2024-02-12
  Administered 2024-02-12: 1000 mg via INTRAVENOUS
  Filled 2024-02-12: qty 200

## 2024-02-12 MED ORDER — ACETAMINOPHEN 325 MG PO TABS
650.0000 mg | ORAL_TABLET | Freq: Four times a day (QID) | ORAL | Status: DC | PRN
  Administered 2024-02-12 – 2024-02-13 (×2): 650 mg via ORAL
  Filled 2024-02-12 (×2): qty 2

## 2024-02-12 MED ORDER — METRONIDAZOLE 500 MG/100ML IV SOLN
500.0000 mg | Freq: Two times a day (BID) | INTRAVENOUS | Status: DC
  Administered 2024-02-12: 500 mg via INTRAVENOUS
  Filled 2024-02-12: qty 100

## 2024-02-12 MED ORDER — LOSARTAN POTASSIUM 50 MG PO TABS: 25.0000 mg | ORAL_TABLET | Freq: Every day | ORAL | Status: DC

## 2024-02-12 MED ORDER — AZTREONAM 2 G IJ SOLR
2.0000 g | Freq: Once | INTRAMUSCULAR | Status: AC
  Administered 2024-02-12: 2 g via INTRAVENOUS
  Filled 2024-02-12: qty 10

## 2024-02-12 MED ORDER — ASPIRIN 81 MG PO TBEC
81.0000 mg | DELAYED_RELEASE_TABLET | Freq: Every morning | ORAL | Status: DC
  Administered 2024-02-13 – 2024-02-15 (×3): 81 mg via ORAL
  Filled 2024-02-12 (×3): qty 1

## 2024-02-12 MED ORDER — VENLAFAXINE HCL ER 37.5 MG PO CP24
37.5000 mg | ORAL_CAPSULE | Freq: Every morning | ORAL | Status: DC
  Filled 2024-02-12: qty 1

## 2024-02-12 MED ORDER — CHLORHEXIDINE GLUCONATE CLOTH 2 % EX PADS
6.0000 | MEDICATED_PAD | Freq: Every day | CUTANEOUS | Status: DC
  Administered 2024-02-12 – 2024-02-19 (×8): 6 via TOPICAL

## 2024-02-12 NOTE — Assessment & Plan Note (Addendum)
 Suspect due to tachycardia and sepsis.  Fall precaution and pt eval Prior to discharge.

## 2024-02-12 NOTE — Assessment & Plan Note (Addendum)
 Vitals:   02/12/24 1032 02/12/24 1045 02/12/24 1100 02/12/24 1130  BP: (!) 128/100 (!) 149/54 (!) 138/51 (!) 135/58   02/12/24 1200 02/12/24 1215 02/12/24 1230 02/12/24 1300  BP: (!) 129/50 (!) 129/57 132/61 (!) 125/56   02/12/24 1430 02/12/24 1542 02/12/24 1600  BP: (!) 126/58 (!) 139/46 (!) 128/39  Home dose of metoprolol  to be continued.

## 2024-02-12 NOTE — Assessment & Plan Note (Addendum)
 Soft carb consistent cardiac diet.  Aspiration precaution.  Glycemic protocol.

## 2024-02-12 NOTE — H&P (Addendum)
 History and Physical    Patient: [name] [name] ZOX:[id number] DOB: 1941/06/27 DOA: 02/12/2024 DOS: the patient was seen and examined on 02/12/2024 PCP: Vevelyn Gowers, NP  Patient coming from: Home Chief complaint: Chief Complaint  Patient presents with   Dizziness   HPI:  [name] [name] is a 83 y.o. female with past medical history  of allergies to multiple medications including Rocephin  sulfonamide tetracycline prednisone Macrobid codeine nisin and sulfa , past medical history of urinary tract infections,h/o CAD s/p DES-RCA in 2007, POBA-mRCA, DES-dRCA in 2018, DES-pRCA in 2021, DES-pLAD in 2022, bacterial endocarditis, hypertension, hyperlipidemia, and type 2 diabetes who is pending left reverse shoulder arthroplasty with Dr. Ellard Gunning of [facility], was seen by cardiology last month for preop clearance for the same presents today with dizziness generalized weakness, confusion, headache, lethargy.  On initial presentation with the dizziness there was concerns for stroke and patient had a head CT in the ED which was negative for any acute intracranial findings.  Going back to cardiology clearance patient was cleared with acceptable risks on 6 May for her shoulder surgery.  ED Course: Pt in ed at bedside  is alert awake oriented. Vital signs in the ED were notable for the following:  Vitals:   02/12/24 1516 02/12/24 1542 02/12/24 1600 02/12/24 2005  BP:  (!) 139/46 (!) 128/39   Pulse:  (!) 112 90   Temp: 98.3 F (36.8 C) 99.6 F (37.6 C)    Resp:  16 (!) 24 18  Height:      Weight:      SpO2:  95% 94%   TempSrc: Oral Oral  Oral  BMI (Calculated):      >>ED evaluation thus far shows: CMP showing sodium 131 chloride 97 glucose 330 AKI with a creatinine of 1.30 GFR 41 normal LFTs. CBC showed white count of 12.6 normal hemoglobin and normal platelets. Viral panel negative for flu RSV and COVID. Abnormal urinalysis with 11-20 WBCs and many bacteria nitrite positive urine. CT  imaging showing perinephric stranding concerning for ascending infection or recently passed stone. EKG shows sinus tach at 112 PR 146 QTc 477, PACs.  >>While in the ED patient received the following: Medications  lactated ringers  bolus 1,000 mL (0 mLs Intravenous Stopped 02/12/24 1222)  aztreonam (AZACTAM) 2 g in sodium chloride  0.9 % 100 mL IVPB (0 g Intravenous Stopped 02/12/24 1212)  metroNIDAZOLE (FLAGYL) IVPB 500 mg (0 mg Intravenous Stopped 02/12/24 1212)  vancomycin  (VANCOCIN ) IVPB 1000 mg/200 mL premix (0 mg Intravenous Stopped 02/12/24 1212)   Review of Systems  Constitutional:  Positive for chills and fever.  Neurological:  Positive for dizziness and weakness.   Past Medical History:  Diagnosis Date   Allergy    Anal fissure    Anxiety    CAD (coronary artery disease)    s/p inf MI 2007 - tx w/ DES to RCA // s/p POBA to [facility] and DES to dRCA in 11/18 ([facility] in [place], [place]) // Myoview  3/21: low risk // s/p DES to pRCA   Cataract    removed both eyes   Cerebrovascular disease    Carotid US  09/2017 [facility]): mild plaque (<50%) in both carotid arteries   Chronic neck pain    Chronic pain syndrome    Diabetes Mellitus, Type 2    Diverticular disease    DJD (degenerative joint disease)    Echocardiogram 10/2018    Echo 10/2018: EF 55-60, normal RVSF, mod MAC, mod TR, severe AoV calcification and sclerosis  with nodular calcium /mobile area of calcium  in the LVOT (small veg vs Lambl's excrescence  - consider TEE), mild AI, mild AS (mean 11).    Echocardiogram 04/2019    Echocardiogram 04/2019: EF 60-65, basal septal hypertrophy, grade 2 diastolic dysfunction, normal wall motion, normal RV SF, mild LAE, mod MAC, trivial MR, mod sclerosis of the aortic valve with mod aortic annular calcification, thin mobile filamentous structure on ventricular side of AV likely representing Lambl's excrescence, mild AI, mild TR   Frequent UTI's    Gastroesophageal reflux disease    H/O bacterial  endocarditis    Rx w IV Abxs in 2020 ([facility] in [place], [place]) // F/u echo with AoV Lambl's excrescence   H/O hiatal hernia    HTN (hypertension)    Hx of fall 10/2020   Hx of MI 2007   Hx of Stroke    IBS (irritable bowel syndrome)    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 (Cervical Fusion)   Premature ventricular contractions    Tubular adenoma of colon    Ulcer    [facility]   Urinary incontinence    Past Surgical History:  Procedure Laterality Date   APPENDECTOMY  1955   BLADDER REPAIR  2007   BRONCHIAL BIOPSY  05/17/2022   Procedure: BRONCHIAL BIOPSIES;  Surgeon: Gloriajean Large, MD;  Location: [facility] ENDOSCOPY;  Service: Pulmonary;;   BRONCHIAL BRUSHINGS  05/17/2022   Procedure: BRONCHIAL BRUSHINGS;  Surgeon: Gloriajean Large, MD;  Location: [facility] ENDOSCOPY;  Service: Pulmonary;;   BRONCHIAL NEEDLE ASPIRATION BIOPSY  05/17/2022   Procedure: BRONCHIAL NEEDLE ASPIRATION BIOPSIES;  Surgeon: Gloriajean Large, MD;  Location: [facility] ENDOSCOPY;  Service: Pulmonary;;   CARDIAC CATHETERIZATION  2007   Stents   CERVICAL SPINE SURGERY  07/2005   Dr Paulla Bossier   CHOLECYSTECTOMY  2007   COLONOSCOPY     CORONARY PRESSURE/FFR WITH 3D MAPPING N/A 06/28/2021   Procedure: Coronary Pressure Wire/FFR w/3D Mapping;  Surgeon: Odie Benne, MD;  Location: [facility] INVASIVE CV LAB;  Service: Cardiovascular;  Laterality: N/A;   CORONARY STENT INTERVENTION N/A 03/09/2020   Procedure: CORONARY STENT INTERVENTION;  Surgeon: Odie Benne, MD;  Location: [facility] INVASIVE CV LAB;  Service: Cardiovascular;  Laterality: N/A;   CORONARY STENT INTERVENTION N/A 06/28/2021   Procedure: CORONARY STENT  INTERVENTION;  Surgeon: Odie Benne, MD;  Location: [facility] INVASIVE CV LAB;  Service: Cardiovascular;  Laterality: N/A;   DENTAL SURGERY  05/2013   replaced an inplant   EYE SURGERY  2011   Bilteral   FIDUCIAL MARKER PLACEMENT  05/17/2022   Procedure: FIDUCIAL MARKER PLACEMENT;  Surgeon: Gloriajean Large, MD;  Location: [facility] ENDOSCOPY;  Service: Pulmonary;;  LUL   I & D KNEE WITH POLY EXCHANGE  09/25/2011   Procedure: IRRIGATION AND DEBRIDEMENT KNEE WITH POLY EXCHANGE;  Surgeon: Neville Barbone, MD;  Location: [facility] OR;  Service: Orthopedics;  Laterality: Left;   INCISION AND DRAINAGE ABSCESS / HEMATOMA OF BURSA / KNEE / THIGH  09/2011   JOINT REPLACEMENT  2012   left   LEFT HEART CATH AND CORONARY ANGIOGRAPHY N/A 03/09/2020  Procedure: LEFT HEART CATH AND CORONARY ANGIOGRAPHY;  Surgeon: Odie Benne, MD;  Location: [facility] INVASIVE CV LAB;  Service: Cardiovascular;  Laterality: N/A;   LEFT HEART CATH AND CORONARY ANGIOGRAPHY N/A 06/28/2021   Procedure: LEFT HEART CATH AND CORONARY ANGIOGRAPHY;  Surgeon: Odie Benne, MD;  Location: [facility] INVASIVE CV LAB;  Service: Cardiovascular;  Laterality: N/A;   left Total Knee Replacement  11/2010   Dr Abigail Abler   NECK SURGERY     has had 3 surgeries   POLYPECTOMY     POSTERIOR CERVICAL FUSION/FORAMINOTOMY  04/08/2012   Procedure: POSTERIOR CERVICAL FUSION/FORAMINOTOMY LEVEL 2;  Surgeon: Shary Deems, MD;  Location: [facility] NEURO ORS;  Service: Neurosurgery;  Laterality: Left;  Left Cervical six-seven Foraminotomy, bilateral cervical seven-thoracic one foraminotomy, cervical six-seven, cervical seven-thoracic one fusion with posterior instrumentation   TEMPOROMANDIBULAR JOINT SURGERY     thumb surgery     TONSILLECTOMY     TONSILLECTOMY  1950   TOTAL ABDOMINAL HYSTERECTOMY     VIDEO BRONCHOSCOPY WITH RADIAL ENDOBRONCHIAL ULTRASOUND  05/17/2022   Procedure: VIDEO BRONCHOSCOPY WITH RADIAL ENDOBRONCHIAL ULTRASOUND;  Surgeon: Gloriajean Large,  MD;  Location: [facility] ENDOSCOPY;  Service: Pulmonary;;    reports that she has never smoked. She has never been exposed to tobacco smoke. She has never used smokeless tobacco. She reports that she does not drink alcohol  and does not use drugs. Allergies  Allergen Reactions   Sulfa  Antibiotics Itching   Niacin  And Related Other (See Comments)    Must take Flush-free   Codeine Nausea Only   Macrobid [Nitrofurantoin] Nausea Only    Severe nausea   Prednisone Itching and Rash   Rocephin  [Ceftriaxone ] Itching   Sulfonamide Derivatives Itching   Tetracycline Itching and Rash   Family History  Problem Relation Age of Onset   Heart disease Mother    Pancreatic cancer Mother    Colon polyps Sister    Pancreatitis Sister    Lung cancer Brother    Cancer - Other Brother        vocal cord cancer   Coronary artery disease Other        sibling   Coronary artery disease Other        sibling   Colon cancer Neg Hx    Esophageal cancer Neg Hx    Rectal cancer Neg Hx    Stomach cancer Neg Hx    Prior to Admission medications   Medication Sig Start Date End Date Taking? Authorizing Provider  acetaminophen  (TYLENOL ) 325 MG tablet Take 650 mg by mouth every 6 (six) hours as needed for moderate pain.    [provider]  amLODipine  (NORVASC ) 5 MG tablet Take 1 tablet (5 mg total) by mouth daily. 11/07/21   Adelia Homestead, MD  Ascorbic Acid  (VITAMIN C ) 500 MG CAPS Take 500 mg by mouth in the morning and at bedtime.    [provider]  aspirin  EC 81 MG tablet Take 81 mg by mouth every morning.     [provider]  atorvastatin  (LIPITOR ) 80 MG tablet TAKE ONE TABLET BY MOUTH EVERYDAY AT BEDTIME 08/06/22   Arcadio Knuckles, MD  Cinnamon 500 MG capsule Take 2 capsules twice a day by oral route as directed.    [provider]  clopidogrel  (PLAVIX ) 75 MG tablet TAKE ONE CAPSULE BY MOUTH EVERY MORNING 08/06/22   Arcadio Knuckles, MD  dicyclomine  (BENTYL ) 10 MG capsule  Take 10 mg by mouth 3 (three)  times daily.    [provider]  ezetimibe  (ZETIA ) 10 MG tablet TAKE ONE TABLET BY MOUTH EVERY MORNING 08/06/22   Arcadio Knuckles, MD  famotidine  (PEPCID ) 40 MG tablet Take 40 mg by mouth 2 (two) times daily. 12/06/22   [provider]  gabapentin  (NEURONTIN ) 300 MG capsule Take 300 mg by mouth 3 (three) times daily.    [provider]  losartan  (COZAAR ) 25 MG tablet Take 1 tablet (25 mg total) by mouth daily. 02/12/22   Clearnce Curia, NP  metoprolol  succinate (TOPROL -XL) 50 MG 24 hr tablet Take 1 tablet (50 mg total) by mouth 2 (two) times daily. Take with or immediately following a meal. 12/30/23   Flo Hummingbird, PA-C  montelukast (SINGULAIR) 10 MG tablet Take 10 mg by mouth at bedtime. 02/07/23   [provider]  nitroGLYCERIN  (NITROSTAT ) 0.4 MG SL tablet DISSOLVE 1 TABLET UNDER THE TONGUE EVERY 5 MINUTES AS NEEDED FOR CHEST PAIN. DO NOT EXCEED A TOTAL OF 3 DOSES IN 15 MINUTES. 11/29/21   Arcadio Knuckles, MD  Omega-3 Fatty Acids (FISH OIL) 1000 MG CAPS Take 1,000 mg by mouth in the morning and at bedtime.    [provider]  ondansetron  (ZOFRAN ) 4 MG tablet Take 4 mg by mouth every 8 (eight) hours as needed for vomiting or nausea. 02/07/23   [provider]  pantoprazole  (PROTONIX ) 40 MG tablet Take 40 mg by mouth 2 (two) times daily.    [provider]  Psyllium (DAILY FIBER PO) Take 1 capsule by mouth daily.    [provider]  venlafaxine  XR (EFFEXOR -XR) 37.5 MG 24 hr capsule Take 37.5 mg by mouth every morning. 02/07/23   [provider]                                                                                 Vitals:   02/12/24 1516 02/12/24 1542 02/12/24 1600 02/12/24 2005  BP:  (!) 139/46 (!) 128/39   Pulse:  (!) 112 90   Resp:  16 (!) 24 18  Temp: 98.3 F (36.8 C) 99.6 F (37.6 C)    TempSrc: Oral Oral  Oral  SpO2:  95% 94%   Weight:      Height:       Physical  Exam Vitals and nursing note reviewed.  Constitutional:      General: She is not in acute distress.    Appearance: She is ill-appearing.  HENT:     Head: Normocephalic and atraumatic.     Right Ear: Hearing and external ear normal.     Left Ear: Hearing and external ear normal.     Mouth/Throat:     Lips: Pink.     Mouth: Mucous membranes are dry.  Eyes:     General: Lids are normal.     Extraocular Movements: Extraocular movements intact.  Cardiovascular:     Rate and Rhythm: Regular rhythm. Tachycardia present.     Pulses: Normal pulses.     Heart sounds: Normal heart sounds.  Pulmonary:     Effort: Pulmonary effort is normal.     Breath sounds: Normal breath sounds.  Abdominal:  General: Bowel sounds are normal. There is no distension.     Palpations: Abdomen is soft. There is no mass.     Tenderness: There is no abdominal tenderness.  Musculoskeletal:     Right lower leg: No edema.     Left lower leg: No edema.  Skin:    General: Skin is warm.  Neurological:     General: No focal deficit present.     Mental Status: She is alert and oriented to person, place, and time.     Cranial Nerves: Cranial nerves 2-12 are intact.  Psychiatric:        Mood and Affect: Mood normal.        Speech: Speech normal.     Labs on Admission: I have personally reviewed following labs and imaging studies CBC: Recent Labs  Lab 02/12/24 1039 02/12/24 1055  WBC 12.6*  --   NEUTROABS 10.9*  --   HGB 13.9 15.3*  HCT 44.1 45.0  MCV 98.4  --   PLT 233  --    Basic Metabolic Panel: Recent Labs  Lab 02/12/24 1039 02/12/24 1055  NA 131* 132*  K 4.4 4.3  CL 97* 94*  CO2 22  --   GLUCOSE 330* 333*  BUN 25* 28*  CREATININE 1.30* 1.20*  CALCIUM  8.4*  --    GFR: Estimated Creatinine Clearance: 27.4 mL/min (A) (by C-G formula based on SCr of 1.2 mg/dL (H)). Liver Function Tests: Recent Labs  Lab 02/12/24 1039  AST 22  ALT 17  ALKPHOS 72  BILITOT 0.8  PROT 6.6  ALBUMIN  3.0*   No results for input(s): LIPASE, AMYLASE in the last 168 hours. No results for input(s): AMMONIA in the last 168 hours. Coagulation Profile: Recent Labs  Lab 02/12/24 1039  INR 1.2   Cardiac Enzymes: No results for input(s): CKTOTAL, CKMB, CKMBINDEX, TROPONINI in the last 168 hours. BNP (last 3 results) No results for input(s): PROBNP in the last 8760 hours. HbA1C: No results for input(s): HGBA1C in the last 72 hours. CBG: Recent Labs  Lab 02/12/24 2012  GLUCAP 228*   Lipid Profile: No results for input(s): CHOL, HDL, LDLCALC, TRIG, CHOLHDL, LDLDIRECT in the last 72 hours. Thyroid  Function Tests: No results for input(s): TSH, T4TOTAL, FREET4, T3FREE, THYROIDAB in the last 72 hours. Anemia Panel: No results for input(s): VITAMINB12, FOLATE, FERRITIN, TIBC, IRON, RETICCTPCT in the last 72 hours. Urine analysis:    Component Value Date/Time   COLORURINE YELLOW 02/12/2024 1149   APPEARANCEUR HAZY (A) 02/12/2024 1149   LABSPEC 1.015 02/12/2024 1149   PHURINE 5.0 02/12/2024 1149   GLUCOSEU 50 (A) 02/12/2024 1149   GLUCOSEU NEGATIVE 03/05/2022 0841   HGBUR NEGATIVE 02/12/2024 1149   BILIRUBINUR NEGATIVE 02/12/2024 1149   BILIRUBINUR negative 03/10/2015 1516   KETONESUR 5 (A) 02/12/2024 1149   PROTEINUR 100 (A) 02/12/2024 1149   UROBILINOGEN 1.0 03/05/2022 0841   NITRITE POSITIVE (A) 02/12/2024 1149   LEUKOCYTESUR TRACE (A) 02/12/2024 1149   Radiological Exams on Admission: CT ABDOMEN PELVIS W CONTRAST Result Date: 02/12/2024 CLINICAL DATA:  Sepsis, dizziness EXAM: CT ABDOMEN AND PELVIS WITH CONTRAST TECHNIQUE: Multidetector CT imaging of the abdomen and pelvis was performed using the standard protocol following bolus administration of intravenous contrast. RADIATION DOSE REDUCTION: This exam was performed according to the departmental dose-optimization program which includes automated exposure control, adjustment of  the mA and/or kV according to patient size and/or use of iterative reconstruction technique. CONTRAST:  65mL OMNIPAQUE  IOHEXOL  350  MG/ML SOLN COMPARISON:  None available. FINDINGS: Motion degraded study. Lower chest: For findings above the diaphragm, please see the separately dictated CT of the chest report, which was performed concurrently. Hepatobiliary: No mass.Cholecystectomy. No intrahepatic or extrahepatic biliary ductal dilation. The portal veins are patent. Pancreas: No mass or main ductal dilation. No peripancreatic inflammation or fluid collection. Spleen: Normal size. No mass. Adrenals/Urinary Tract: No adrenal masses. No renal mass. Somewhat asymmetric enhancement of the renal parenchyma with decreased enhancement noted in the left mid and upper portions. Left perinephric stranding. No nephrolithiasis or hydronephrosis. The urinary bladder is distended without focal abnormality. Stomach/Bowel: Small hiatal hernia. The stomach contains ingested material without focal abnormality. No small bowel wall thickening or inflammation. No small bowel obstruction.The appendix was not visualized. No right lower quadrant or pericecal inflammatory changes to suggest acute appendicitis. Subtle inflammatory stranding about the splenic flexure of the colon. Vascular/Lymphatic: No aortic aneurysm. Diffuse aortoiliac atherosclerosis. No intraabdominal or pelvic lymphadenopathy. Reproductive: Hysterectomy. No concerning adnexal mass.No free pelvic fluid. Other: No pneumoperitoneum, ascites, or mesenteric inflammation. Musculoskeletal: No acute fracture or destructive lesion. Osteopenia. Multilevel degenerative disc disease of the spine. Mild bilateral hip osteoarthritis. IMPRESSION: 1. Left perinephric stranding with somewhat asymmetric enhancement of the left renal parenchyma, which may be due to a recently passed calculus or an ascending urinary tract infection. Correlation with urinalysis recommended. 2. Subtle  inflammatory stranding about the splenic flexure of the colon, which may be reactive to the left perinephric stranding. Alternatively, a focal infectious or inflammatory colitis could also have this appearance in the correct clinical context. Aortic Atherosclerosis (ICD10-I70.0). Electronically Signed   By: Rance Burrows M.D.   On: 02/12/2024 16:22   CT Angio Chest Pulmonary Embolism (PE) W or WO Contrast Result Date: 02/12/2024 CLINICAL DATA:  Pulmonary embolism (PE) suspected, high prob sepsis. EXAM: CT ANGIOGRAPHY CHEST WITH CONTRAST TECHNIQUE: Multidetector CT imaging of the chest was performed using the standard protocol during bolus administration of intravenous contrast. Multiplanar CT image reconstructions and MIPs were obtained to evaluate the vascular anatomy. RADIATION DOSE REDUCTION: This exam was performed according to the departmental dose-optimization program which includes automated exposure control, adjustment of the mA and/or kV according to patient size and/or use of iterative reconstruction technique. CONTRAST:  65mL OMNIPAQUE  IOHEXOL  350 MG/ML SOLN COMPARISON:  June 11, 2023 FINDINGS: Motion degraded study. Pulmonary Embolism: No pulmonary embolism to the level of the proximal segmental pulmonary arteries bilaterally. The distal segmental and subsegmental branches are degraded by motion. Cardiovascular: No cardiomegaly or pericardial effusion. No aortic aneurysm. Diffuse atherosclerosis. Multi-vessel coronary atherosclerosis. Mediastinum/Nodes: No mediastinal mass. No mediastinal, hilar, or axillary lymphadenopathy. Small sliding-type hiatal hernia. Lungs/Pleura: The midline trachea and bronchi are patent. No focal airspace consolidation, pleural effusion, or pneumothorax. Unchanged 1.4 x 1.9 cm nodule in the medial left lung apex (axial 15). The solid subpleural nodule in the lateral right lower lobe (axial 82 previously), is now more ground-glass than on the prior study.  Musculoskeletal: No acute fracture or destructive bone lesion. Anterior and posterior cervical fusion hardware is partially visualized. Osteopenia. Multilevel degenerative disc disease of the spine. Severe left glenohumeral joint osteoarthritis with moderate right glenohumeral joint osteoarthritis. Upper Abdomen: For findings below the diaphragm, please see the separately dictated CT of the abdomen and pelvis report, which was performed concurrently. Review of the MIP images confirms the above findings. IMPRESSION: 1. No pulmonary embolism to the level of the proximal segmental pulmonary arteries bilaterally. The distal segmental and subsegmental branches are degraded  by motion. 2. No pneumonia, pulmonary edema, or pleural effusion. 3. Unchanged 1.4 x 1.9 cm nodule in the medial left lung apex (axial 15). The solid subpleural nodule in the lateral right lower lobe is more ground-glass than on the prior study. A repeat chest CT in 3-6 months is recommended. 4. Small sliding-type hiatal hernia. Aortic Atherosclerosis (ICD10-I70.0). Electronically Signed   By: Rance Burrows M.D.   On: 02/12/2024 16:07   CT Head Wo Contrast Result Date: 02/12/2024 CLINICAL DATA:  83 year old female with altered mental status, dizziness, confusion, headache times 2 days. EXAM: CT HEAD WITHOUT CONTRAST TECHNIQUE: Contiguous axial images were obtained from the base of the skull through the vertex without intravenous contrast. RADIATION DOSE REDUCTION: This exam was performed according to the departmental dose-optimization program which includes automated exposure control, adjustment of the mA and/or kV according to patient size and/or use of iterative reconstruction technique. COMPARISON:  Head CT 10/22/2023. FINDINGS: Brain: Cerebral volume is within normal limits for age. No midline shift, ventriculomegaly, mass effect, evidence of mass lesion, intracranial hemorrhage or evidence of cortically based acute infarction. Patchy  bilateral cerebral white matter hypodensity. Chronic heterogeneity in the deep gray nuclei, more so on the left. Stable gray-white matter differentiation throughout the brain. Vascular: No suspicious intracranial vascular hyperdensity. Calcified atherosclerosis at the skull base. Skull: Stable. 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 No acute osseous abnormality identified. Sinuses/Orbits: Visualized paranasal sinuses and mastoids are clear. Other: No acute orbit or scalp soft tissue finding. IMPRESSION: 1. No acute intracranial abnormality. 2. 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 Electronically Signed   By: Marlise Simpers M.D.   On: 02/12/2024 12:11   DG Chest Port 1 View Result Date: 02/12/2024 CLINICAL DATA:  Fever. EXAM: PORTABLE CHEST 1 VIEW COMPARISON:  Chest radiograph dated 09/26/2022. FINDINGS: No focal consolidation, pleural effusion or pneumothorax. The cardiac silhouette is within limits. Small metallic clip in the left upper lobe similar to prior radiograph. Atherosclerotic calcification of the aorta. No acute osseous pathology. IMPRESSION: No active disease. Electronically Signed   By: Angus Bark M.D.   On: 02/12/2024 11:31   Data Reviewed: Relevant notes from primary care and specialist visits, past discharge summaries as available in EHR, including Care Everywhere . Prior diagnostic testing as pertinent to current admission diagnoses, Updated medications and problem lists for reconciliation .ED course, including vitals, labs, imaging, treatment and response to treatment,Triage notes, nursing and pharmacy notes and ED provider's notes.Notable results as noted in HPI.Discussed case with EDMD/ ED APP/ or Specialty MD on call and as needed.  Assessment & Plan Dizziness Suspect due to tachycardia and sepsis.  Fall precaution and pt eval Prior to discharge.   DM2 (diabetes mellitus, type 2) (HCC) Soft carb consistent cardiac diet.  Aspiration precaution.   Glycemic protocol.   Essential hypertension Vitals:   02/12/24 1032 02/12/24 1045 02/12/24 1100 02/12/24 1130  BP: (!) 128/100 (!) 149/54 (!) 138/51 (!) 135/58   02/12/24 1200 02/12/24 1215 02/12/24 1230 02/12/24 1300  BP: (!) 129/50 (!) 129/57 132/61 (!) 125/56   02/12/24 1430 02/12/24 1542 02/12/24 1600  BP: (!) 126/58 (!) 139/46 (!) 128/39  Home dose of metoprolol  to be continued.   CAD (coronary artery disease) Continue patient on atorvastatin , metoprolol  and Zetia . Stage 3b chronic kidney disease (HCC) Lab Results  Component Value Date   CREATININE 1.20 (H) 02/12/2024   CREATININE 1.30 (H) 02/12/2024   CREATININE 0.82 01/29/2024  Continue hydration.  Will currently  hold losartan .  Chronic diastolic CHF (congestive heart failure) (HCC) Patient is clinically euvolemic and almost dry mucous membranes and dehydrated.  CHF is stable otherwise.  Will continue metoprolol .  And hold losartan . Sepsis due to urinary tract infection [facility]) Patient meeting sepsis criteria with tachycardia white count abnormal urinalysis and CT findings consistent with ascending UTI with perinephric stranding.  Will continue patient on aztreonam, flagyl  and vancomycin .  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  Lethargy Secondary to sepsis patient is alert awake and oriented at bedside.  Head CT is negative for any acute intracranial findings.  DVT prophylaxis:  Heparin   Consults:  None.  Advance Care Planning:    Code Status: Limited: Do not attempt resuscitation (DNR) -DNR-LIMITED -Do Not Intubate/DNI    Family Communication:  Daughter.  Disposition Plan:  Home.  Severity of Illness: The appropriate patient status for this patient is INPATIENT. Inpatient status is judged to be reasonable and necessary in order to provide the required intensity of service to ensure the patient's safety. The patient's presenting symptoms, physical exam findings, and initial radiographic and laboratory data  in the context of their chronic comorbidities is felt to place them at high risk for further clinical deterioration. Furthermore, it is not anticipated that the patient will be medically stable for discharge from the hospital within 2 midnights of admission.   * I certify that at the point of admission it is my clinical judgment that the patient will require inpatient hospital care spanning beyond 2 midnights from the point of admission due to high intensity of service, high risk for further deterioration and high frequency of surveillance required.*  Unresulted Labs (From admission, onward)     Start     Ordered   02/12/24 2014  Brain natriuretic peptide  Add-on,   AD        02/12/24 2013   02/12/24 2011  Lactic acid, plasma  (Lactic Acid)  ONCE - STAT,   STAT        02/12/24 2010   02/12/24 1755  Hemoglobin A1c  Add-on,   AD        02/12/24 1755   02/12/24 1149  Urine Culture  Once,   R        02/12/24 1149   02/12/24 1039  Blood Culture (routine x 2)  (Undifferentiated presentation (screening labs and basic nursing orders))  BLOOD CULTURE X 2,   STAT      02/12/24 1039          Troponin added on and pending.  Meds ordered this encounter  Medications   lactated ringers  bolus 1,000 mL   aztreonam (AZACTAM) 2 g in sodium chloride  0.9 % 100 mL IVPB    Antibiotic Indication::   Other Indication (list below)    Other Indication::   Unknown Source.   metroNIDAZOLE (FLAGYL) IVPB 500 mg    Antibiotic Indication::   Other Indication (list below)    Other Indication::   Unknown Source.   vancomycin  (VANCOCIN ) IVPB 1000 mg/200 mL premix    Indication::   Other Indication (list below)    Other Indication::   Unknown Source.   aztreonam (AZACTAM) 1 g in sodium chloride  0.9 % 100 mL IVPB    Antibiotic Indication::   Other Indication (list below)    Other Indication::   Unknown Source.   metroNIDAZOLE (FLAGYL) IVPB 500 mg    Antibiotic Indication::   Other Indication (list below)    Other  Indication::   Unknown Source.   lactated ringers  infusion   DISCONTD: metoprolol  tartrate (LOPRESSOR ) injection 5 mg   OR Linked Order Group    acetaminophen  (TYLENOL ) tablet 650 mg    acetaminophen  (TYLENOL ) suppository 650 mg   vancomycin  (VANCOCIN ) IVPB 1000 mg/200 mL premix    Indication::   Sepsis   iohexol  (OMNIPAQUE ) 350 MG/ML injection 65 mL   atorvastatin  (LIPITOR ) tablet 80 mg    TAKE ONE TABLET BY MOUTH EVERYDAY AT BEDTIME     venlafaxine  XR (EFFEXOR -XR) 24 hr capsule 37.5 mg   clopidogrel  (PLAVIX ) tablet 75 mg   ezetimibe  (ZETIA ) tablet 10 mg   DISCONTD: metoprolol  succinate (TOPROL -XL) 24 hr tablet 25 mg    Take with or immediately following a meal.     DISCONTD: losartan  (COZAAR ) tablet 25 mg   aspirin  EC tablet 81 mg   lactated ringers  bolus 1,000 mL   Chlorhexidine  Gluconate Cloth 2 % PADS 6 each   insulin  aspart (novoLOG ) injection 0-9 Units    Correction coverage::   Sensitive (thin, NPO, renal)    CBG < 70::   Implement Hypoglycemia Standing Orders and refer to Hypoglycemia Standing Orders sidebar report    CBG 70 - 120::   0 units    CBG 121 - 150::   1 unit    CBG 151 - 200::   2 units    CBG 201 - 250::   3 units    CBG 251 - 300::   5 units    CBG 301 - 350::   7 units    CBG 351 - 400:   9 units    CBG > 400:   call MD and obtain STAT lab verification     Orders Placed This Encounter  Procedures   Critical Care   Blood Culture (routine x 2)   Resp panel by RT-PCR (RSV, Flu A&B, Covid) Anterior Nasal Swab   Urine Culture   DG Chest Port 1 View   CT Head Wo Contrast   CT Angio Chest Pulmonary Embolism (PE) W or WO Contrast   CT ABDOMEN PELVIS W CONTRAST   Comprehensive metabolic panel   CBC with Differential   Protime-INR   Urinalysis, w/ Reflex to Culture (Infection Suspected) -Urine, Catheterized   Procalcitonin   C-reactive protein   Hemoglobin A1c   Lactic acid, plasma   Glucose, capillary   Brain natriuretic peptide   Diet Heart Room  service appropriate? Yes; Fluid consistency: Thin   Check Rectal Temperature   Document height and weight   Assess and Document Glasgow Coma Scale   Document vital signs within 1-hour of fluid bolus completion.  Notify provider of abnormal vital signs despite fluid resuscitation.   Refer to Sidebar Report: Sepsis Bundle ED/IP   Notify provider for difficulties obtaining IV access   Initiate Carrier Fluid Protocol   In and Out Cath   DO NOT delay antibiotics if unable to obtain blood culture.   Vital signs   Notify physician (specify)   Mobility Protocol: No Restrictions RN to initiate protocols based on patient's level of care   Refer to Sidebar Report Refer to ICU, Med-Surg, Progressive, and Step-Down Mobility Protocol Sidebars   Initiate Adult Central Line Maintenance and Catheter Protocol for patients with central line (CVC, PICC, Port, Hemodialysis, Trialysis)   Do not place and if present remove PureWick   Initiate Oral Care Protocol   Initiate Carrier Fluid Protocol   RN may order General Admission PRN  Orders utilizing General Admission PRN medications (through manage orders) for the following patient needs: allergy symptoms (Claritin), cold sores (Carmex), cough (Robitussin DM), eye irritation (Liquifilm Tears), hemorrhoids (Tucks), indigestion (Maalox), minor skin irritation (Hydrocortisone Cream), muscle pain Lovena Rubinstein Gay), nose irritation (saline nasal spray) and sore throat (Chloraseptic spray).   Cardiac Monitoring - Continuous Indefinite   Swallow screen   Strict intake and output   Insert urethral catheter If Coude Catheter is chosen, qualified resources by campus can be found in the clinical skills nursing procedure for Coude Catheter, And on Amion under Urology.  If catheter irrigation is needed consider large-bore cathete...   Apply Diabetes Mellitus Care Plan   STAT CBG when hypoglycemia is suspected. If treated, recheck every 15 minutes after each treatment until CBG >/= 70  mg/dl   Refer to Hypoglycemia Protocol Sidebar Report for treatment of CBG < 70 mg/dl   No HS correction Insulin    No meal coverage at this time.   Do not attempt resuscitation (DNR)- Limited -Do Not Intubate (DNI)   Code Sepsis activation.  This occurs automatically when order is signed and prioritizes pharmacy, lab, and radiology services for STAT collections and interventions.  If [facility] downtime, call [facility] [phone]) to activate Code Sepsis.   Consult to hospitalist   vancomycin  per pharmacy consult   Oxygen therapy Mode or (Route): Nasal cannula; Liters Per Minute: 2; Keep O2 saturation between: greater than 92 %   I-Stat Lactic Acid, ED   I-stat chem 8, ED   CG4 I-STAT (Lactic acid)   EKG 12-Lead   ED EKG   ECHOCARDIOGRAM COMPLETE   Insert peripheral IV X 1   Insert 2nd peripheral IV if not already present.   Admit to Inpatient (patient's expected length of stay will be greater than 2 midnights or inpatient only procedure)   Aspiration precautions   Fall precautions    Author: Lavanda Porter, MD 12 pm- 8 pm. [facility]. 02/12/2024 8:17 PM >>Please note for any concern,or critical results after hours past 8pm please contact the [facility] floor coverage provider from 7 PM- 7 AM. For on call review [email or web address], username [email or web address] and PW: your phone number<<

## 2024-02-12 NOTE — Assessment & Plan Note (Addendum)
 Patient is clinically euvolemic and almost dry mucous membranes and dehydrated.  CHF is stable otherwise.  Will continue metoprolol .  And hold losartan .

## 2024-02-12 NOTE — Progress Notes (Addendum)
 RN contacted lab to redraw repeat LA.   RN called Lab again for STAT labs added on. Tech states she is on the way to the unit.  CBG 228 vitals updated in chart.  PT is  restless, denies pain, c/o of nausea. HR noted at 113 ST BP 165/80 RN to call MD to report.  Urine output 1200 ml since foley insertion. Urine yellow and clear.   MD notified of elevated cardiac labs. Awaiting repeat LA results.  Nonsustained HR in 140's then goes back to 103.  No CP or resp distress noted. Tele strips saved and documented.

## 2024-02-12 NOTE — Assessment & Plan Note (Addendum)
 Continue patient on atorvastatin , metoprolol  and Zetia .

## 2024-02-12 NOTE — ED Triage Notes (Signed)
 Pt brought in by [facility] from home. Family called d/t dizziness, confusion, H/A started on Monday. Since yesterday sleeping more than normal . Baseline A&O X 4. Yesterday around 0900 family reports expressive aphasic decreased oral intake. Seen 2 weeks ago by PCP noted blood clot in LE.

## 2024-02-12 NOTE — Assessment & Plan Note (Addendum)
 Patient meeting sepsis criteria with tachycardia white count abnormal urinalysis and CT findings consistent with ascending UTI with perinephric stranding.  Will continue patient on aztreonam, flagyl  and vancomycin .  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

## 2024-02-12 NOTE — Assessment & Plan Note (Addendum)
 Secondary to sepsis patient is alert awake and oriented at bedside.  Head CT is negative for any acute intracranial findings.

## 2024-02-12 NOTE — Progress Notes (Signed)
 Elink following code sepsis

## 2024-02-12 NOTE — Progress Notes (Addendum)
   02/12/24 2005  Vitals  Temp Source Oral  BP Location Right Arm  BP Method Automatic  Patient Position (if appropriate) Lying  Resp 18  Level of Consciousness  Level of Consciousness Alert  MEWS COLOR  MEWS Score Color Green  Oxygen Therapy  O2 Device Nasal Cannula  MEWS Score  MEWS Temp 0  MEWS Systolic 0  MEWS Pulse 0  MEWS RR 0  MEWS LOC 0  MEWS Score 0   Current BP 175/60 and reported to MD [facility]

## 2024-02-12 NOTE — Progress Notes (Addendum)
 Pharmacy Antibiotic Note  [name] [name] is a 83 y.o. female admitted on 02/12/2024 with sepsis.  Pharmacy has been consulted for vancomycin  dosing. Vancomycin  x1, Metronidazole and Aztreonam started in the ED. Reported PCN allergy, but has tolerated cephalosporins, in the past.   Scr 0.3 (BL ~ 0.7-0.8). Tmax 101.4. WBC 12.6  Plan: Start vancomycin  1000mg  IV every 48 hours. eAUC 490, Scr 1.2, IBW, Vd 0.72 Can consider switching from Aztreonam to Ceftriaxone  Monitor renal function, fever curve, clinical status Follow up cultures and obtain levels as appropriate    Height: 5' (152.4 cm) Weight: 56.2 kg (124 lb) IBW/kg (Calculated) : 45.5  Temp (24hrs), Avg:100.6 F (38.1 C), Min:99.1 F (37.3 C), Max:102.3 F (39.1 C)  Recent Labs  Lab 02/12/24 1039 02/12/24 1055 02/12/24 1056  WBC 12.6*  --   --   CREATININE 1.30* 1.20*  --   LATICACIDVEN  --   --  2.0*    Estimated Creatinine Clearance: 27.4 mL/min (A) (by C-G formula based on SCr of 1.2 mg/dL (H)).    Allergies  Allergen Reactions   Sulfa  Antibiotics Itching   Niacin  And Related Other (See Comments)    Must take Flush-free   Codeine Nausea Only   Macrobid [Nitrofurantoin] Nausea Only    Severe nausea   Prednisone Itching and Rash   Rocephin  [Ceftriaxone ] Itching   Sulfonamide Derivatives Itching   Tetracycline Itching and Rash    Antimicrobials this admission: Vancomycin  6/11 >> Aztreonam 6/11 >>  Metronidazole 6/11>>   Microbiology results: 6/11 BCx: sent 6/11 UCx: sent  6/11 Resp PCR: neg   Thank you for allowing pharmacy to be a part of this patient's care.  Ayslin Kundert 02/12/2024 2:40 PM

## 2024-02-12 NOTE — ED Provider Notes (Signed)
 [place] EMERGENCY DEPARTMENT AT [facility] Provider Note   CSN: [id number] Arrival date & time: 02/12/24  1022     History  Chief Complaint  Patient presents with   Dizziness    [name] [name] is a 83 y.o. female.  HPI 83 year old female presents with confusion, headache, dizziness starting 2 days ago.  History is from the mother at the bedside that she does not live with the patient but apparently the patient has been dizzy and lightheaded and overall weak with poor p.o. intake for couple days.  This morning woke up and was having some trouble speaking or least hard to understand.  Does have a history of prior UTIs.  Otherwise she is not sure of any recent cough or fever.  EMS reported to the nurse that the patient felt warm so they gave her some Tylenol  en route.  Home Medications Prior to Admission medications   Medication Sig Start Date End Date Taking? Authorizing Provider  acetaminophen  (TYLENOL ) 325 MG tablet Take 650 mg by mouth every 6 (six) hours as needed for moderate pain.    [provider]  amLODipine  (NORVASC ) 5 MG tablet Take 1 tablet (5 mg total) by mouth daily. 11/07/21   Adelia Homestead, MD  Ascorbic Acid  (VITAMIN C ) 500 MG CAPS Take 500 mg by mouth in the morning and at bedtime.    [provider]  aspirin  EC 81 MG tablet Take 81 mg by mouth every morning.     [provider]  atorvastatin  (LIPITOR ) 80 MG tablet TAKE ONE TABLET BY MOUTH EVERYDAY AT BEDTIME 08/06/22   Arcadio Knuckles, MD  Cinnamon 500 MG capsule Take 2 capsules twice a day by oral route as directed.    [provider]  clopidogrel  (PLAVIX ) 75 MG tablet TAKE ONE CAPSULE BY MOUTH EVERY MORNING 08/06/22   Arcadio Knuckles, MD  dicyclomine  (BENTYL ) 10 MG capsule Take 10 mg by mouth 3 (three) times daily.    [provider]  ezetimibe  (ZETIA ) 10 MG tablet TAKE ONE TABLET BY MOUTH EVERY MORNING 08/06/22   Arcadio Knuckles, MD  famotidine  (PEPCID )  40 MG tablet Take 40 mg by mouth 2 (two) times daily. 12/06/22   [provider]  gabapentin  (NEURONTIN ) 300 MG capsule Take 300 mg by mouth 3 (three) times daily.    [provider]  losartan  (COZAAR ) 25 MG tablet Take 1 tablet (25 mg total) by mouth daily. 02/12/22   Clearnce Curia, NP  metoprolol  succinate (TOPROL -XL) 50 MG 24 hr tablet Take 1 tablet (50 mg total) by mouth 2 (two) times daily. Take with or immediately following a meal. 12/30/23   Flo Hummingbird, PA-C  montelukast (SINGULAIR) 10 MG tablet Take 10 mg by mouth at bedtime. 02/07/23   [provider]  nitroGLYCERIN  (NITROSTAT ) 0.4 MG SL tablet DISSOLVE 1 TABLET UNDER THE TONGUE EVERY 5 MINUTES AS NEEDED FOR CHEST PAIN. DO NOT EXCEED A TOTAL OF 3 DOSES IN 15 MINUTES. 11/29/21   Arcadio Knuckles, MD  Omega-3 Fatty Acids (FISH OIL) 1000 MG CAPS Take 1,000 mg by mouth in the morning and at bedtime.    [provider]  ondansetron  (ZOFRAN ) 4 MG tablet Take 4 mg by mouth every 8 (eight) hours as needed for vomiting or nausea. 02/07/23   [provider]  pantoprazole  (PROTONIX ) 40 MG tablet Take 40 mg by mouth 2 (two) times daily.    [provider]  Psyllium (  DAILY FIBER PO) Take 1 capsule by mouth daily.    [provider]  venlafaxine  XR (EFFEXOR -XR) 37.5 MG 24 hr capsule Take 37.5 mg by mouth every morning. 02/07/23   [provider]      Allergies    Sulfa  antibiotics, Niacin  and related, Codeine, Macrobid [nitrofurantoin], Prednisone, Rocephin  [ceftriaxone ], Sulfonamide derivatives, and Tetracycline    Review of Systems   Review of Systems  Respiratory:  Negative for cough and shortness of breath.   Cardiovascular:  Negative for chest pain.  Gastrointestinal:  Negative for abdominal pain.  Musculoskeletal:  Negative for neck pain.  Neurological:  Positive for headaches.    Physical Exam Updated Vital Signs BP 132/61   Pulse 96   Temp 99.1 F (37.3 C) (Rectal)    Resp (!) 22   Ht 5' (1.524 m)   Wt 56.2 kg   SpO2 99%   BMI 24.22 kg/m  Physical Exam Vitals and nursing note reviewed.  Constitutional:      Appearance: She is well-developed. She is not diaphoretic.  HENT:     Head: Normocephalic and atraumatic.  Eyes:     Pupils: Pupils are equal, round, and reactive to light.  Cardiovascular:     Rate and Rhythm: Tachycardia present. Rhythm irregular.     Heart sounds: Normal heart sounds.  Pulmonary:     Effort: Pulmonary effort is normal.     Breath sounds: Normal breath sounds.  Abdominal:     General: There is no distension.     Palpations: Abdomen is soft.     Tenderness: There is no abdominal tenderness.  Musculoskeletal:     Cervical back: Normal range of motion. No rigidity.  Skin:    General: Skin is warm and dry.  Neurological:     Mental Status: She is alert.     Comments: Awake, alert, oriented to person and place. Disoriented to month. Has no facial droop. 5/5 strength in all 4 extremities.      ED Results / Procedures / Treatments   Labs (all labs ordered are listed, but only abnormal results are displayed) Labs Reviewed  COMPREHENSIVE METABOLIC PANEL WITH GFR - Abnormal; Notable for the following components:      Result Value   Sodium 131 (*)    Chloride 97 (*)    Glucose, Bld 330 (*)    BUN 25 (*)    Creatinine, Ser 1.30 (*)    Calcium  8.4 (*)    Albumin 3.0 (*)    GFR, Estimated 41 (*)    All other components within normal limits  CBC WITH DIFFERENTIAL/PLATELET - Abnormal; Notable for the following components:   WBC 12.6 (*)    Neutro Abs 10.9 (*)    All other components within normal limits  PROTIME-INR - Abnormal; Notable for the following components:   Prothrombin Time 15.7 (*)    All other components within normal limits  URINALYSIS, W/ REFLEX TO CULTURE (INFECTION SUSPECTED) - Abnormal; Notable for the following components:   APPearance HAZY (*)    Glucose, UA 50 (*)    Ketones, ur 5 (*)     Protein, ur 100 (*)    Nitrite POSITIVE (*)    Leukocytes,Ua TRACE (*)    Bacteria, UA MANY (*)    All other components within normal limits  I-STAT CG4 LACTIC ACID, ED - Abnormal; Notable for the following components:   Lactic Acid, Venous 2.0 (*)    All other components within normal limits  I-STAT CHEM 8, ED - Abnormal; Notable for the following components:   Sodium 132 (*)    Chloride 94 (*)    BUN 28 (*)    Creatinine, Ser 1.20 (*)    Glucose, Bld 333 (*)    Calcium , Ion 1.04 (*)    Hemoglobin 15.3 (*)    All other components within normal limits  RESP PANEL BY RT-PCR (RSV, FLU A&B, COVID)  [id number]  CULTURE, BLOOD (ROUTINE X 2)  CULTURE, BLOOD (ROUTINE X 2)  URINE CULTURE  I-STAT CG4 LACTIC ACID, ED    EKG EKG Interpretation Date/Time:  Wednesday February 12 2024 10:30:46 EDT Ventricular Rate:  112 PR Interval:  146 QRS Duration:  79 QT Interval:  349 QTC Calculation: 477 R Axis:   -47  Text Interpretation: Sinus tachycardia Atrial premature complexes Left anterior fascicular block Abnormal R-wave progression, late transition Confirmed by Jerilynn Montenegro [phone]) on 02/12/2024 10:57:36 AM  Radiology CT Head Wo Contrast Result Date: 02/12/2024 CLINICAL DATA:  83 year old female with altered mental status, dizziness, confusion, headache times 2 days. EXAM: CT HEAD WITHOUT CONTRAST TECHNIQUE: Contiguous axial images were obtained from the base of the skull through the vertex without intravenous contrast. RADIATION DOSE REDUCTION: This exam was performed according to the departmental dose-optimization program which includes automated exposure control, adjustment of the mA and/or kV according to patient size and/or use of iterative reconstruction technique. COMPARISON:  Head CT 10/22/2023. FINDINGS: Brain: Cerebral volume is within normal limits for age. No midline shift, ventriculomegaly, mass effect, evidence of mass lesion, intracranial hemorrhage or evidence of cortically based  acute infarction. Patchy bilateral cerebral white matter hypodensity. Chronic heterogeneity in the deep gray nuclei, more so on the left. Stable gray-white matter differentiation throughout the brain. Vascular: No suspicious intracranial vascular hyperdensity. Calcified atherosclerosis at the skull base. Skull: Stable. 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 No acute osseous abnormality identified. Sinuses/Orbits: Visualized paranasal sinuses and mastoids are clear. Other: No acute orbit or scalp soft tissue finding. IMPRESSION: 1. No acute intracranial abnormality. 2. 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 Electronically Signed   By: Marlise Simpers M.D.   On: 02/12/2024 12:11   DG Chest Port 1 View Result Date: 02/12/2024 CLINICAL DATA:  Fever. EXAM: PORTABLE CHEST 1 VIEW COMPARISON:  Chest radiograph dated 09/26/2022. FINDINGS: No focal consolidation, pleural effusion or pneumothorax. The cardiac silhouette is within limits. Small metallic clip in the left upper lobe similar to prior radiograph. Atherosclerotic calcification of the aorta. No acute osseous pathology. IMPRESSION: No active disease. Electronically Signed   By: Angus Bark M.D.   On: 02/12/2024 11:31    Procedures .Critical Care  Performed by: Jerilynn Montenegro, MD Authorized by: Jerilynn Montenegro, MD   Critical care provider statement:    Critical care time (minutes):  30   Critical care time was exclusive of:  Separately billable procedures and treating other patients   Critical care was necessary to treat or prevent imminent or life-threatening deterioration of the following conditions:  Sepsis   Critical care was time spent personally by me on the following activities:  Development of treatment plan with patient or surrogate, discussions with consultants, evaluation of patient's response to treatment, examination of patient, ordering and review of laboratory studies, ordering and review of radiographic  studies, ordering and performing treatments and interventions, pulse oximetry, re-evaluation of patient's condition and review of old charts     Medications Ordered in ED Medications  lactated ringers  bolus 1,000  mL (0 mLs Intravenous Stopped 02/12/24 1222)  aztreonam (AZACTAM) 2 g in sodium chloride  0.9 % 100 mL IVPB (0 g Intravenous Stopped 02/12/24 1212)  metroNIDAZOLE (FLAGYL) IVPB 500 mg (0 mg Intravenous Stopped 02/12/24 1212)  vancomycin  (VANCOCIN ) IVPB 1000 mg/200 mL premix (0 mg Intravenous Stopped 02/12/24 1212)    ED Course/ Medical Decision Making/ A&P                                 Medical Decision Making Amount and/or Complexity of Data Reviewed Labs: ordered.    Details: Lactate 2.0.  Leukocytosis of 12.6.  Mild AKI. Radiology: ordered and independent interpretation performed.    Details: No head bleed.  No pneumonia. ECG/medicine tests: ordered and independent interpretation performed.    Details: Tachycardia  Risk Prescription drug management. Decision regarding hospitalization.   Patient presents with fever.  I suspect the source is a UTI with her positive nitrites in her urine.  She was given broad IV antibiotics adjusted to her Rocephin  allergy.  She is also doing better with fluids as well as the Tylenol  that was given by EMS.  She is not in distress.  There is no meningismus on exam the a.m.  At this point she is doing better and will need admission for febrile UTI.  Her initial lactate is 2 but I suspect this will improve with a liter of fluids.  No hypotension.  Discussed with Dr. Lydia Sams, who will admit.        Final Clinical Impression(s) / ED Diagnoses Final diagnoses:  Febrile urinary tract infection    Rx / DC Orders ED Discharge Orders     None         Jerilynn Montenegro, MD 02/12/24 1305

## 2024-02-12 NOTE — Assessment & Plan Note (Addendum)
 Lab Results  Component Value Date   CREATININE 1.20 (H) 02/12/2024   CREATININE 1.30 (H) 02/12/2024   CREATININE 0.82 01/29/2024  Continue hydration.  Will currently hold losartan .

## 2024-02-12 NOTE — ED Notes (Signed)
 CCMD called.

## 2024-02-13 ENCOUNTER — Inpatient Hospital Stay (HOSPITAL_COMMUNITY)

## 2024-02-13 DIAGNOSIS — N390 Urinary tract infection, site not specified: Secondary | ICD-10-CM

## 2024-02-13 DIAGNOSIS — R42 Dizziness and giddiness: Secondary | ICD-10-CM | POA: Diagnosis not present

## 2024-02-13 DIAGNOSIS — I25119 Atherosclerotic heart disease of native coronary artery with unspecified angina pectoris: Secondary | ICD-10-CM | POA: Diagnosis not present

## 2024-02-13 DIAGNOSIS — A419 Sepsis, unspecified organism: Secondary | ICD-10-CM

## 2024-02-13 DIAGNOSIS — N1832 Chronic kidney disease, stage 3b: Secondary | ICD-10-CM

## 2024-02-13 DIAGNOSIS — R509 Fever, unspecified: Secondary | ICD-10-CM

## 2024-02-13 DIAGNOSIS — E119 Type 2 diabetes mellitus without complications: Secondary | ICD-10-CM

## 2024-02-13 LAB — COMPREHENSIVE METABOLIC PANEL WITH GFR
ALT: 17 U/L (ref 0–44)
AST: 21 U/L (ref 15–41)
Albumin: 2.4 g/dL — ABNORMAL LOW (ref 3.5–5.0)
Alkaline Phosphatase: 53 U/L (ref 38–126)
Anion gap: 10 (ref 5–15)
BUN: 13 mg/dL (ref 8–23)
CO2: 25 mmol/L (ref 22–32)
Calcium: 7.8 mg/dL — ABNORMAL LOW (ref 8.9–10.3)
Chloride: 96 mmol/L — ABNORMAL LOW (ref 98–111)
Creatinine, Ser: 0.81 mg/dL (ref 0.44–1.00)
GFR, Estimated: >60 mL/min (ref >60)
Glucose, Bld: 184 mg/dL — ABNORMAL HIGH (ref 70–99)
Potassium: 3.6 mmol/L (ref 3.5–5.1)
Sodium: 131 mmol/L — ABNORMAL LOW (ref 135–145)
Total Bilirubin: 0.8 mg/dL (ref 0.0–1.2)
Total Protein: 5.5 g/dL — ABNORMAL LOW (ref 6.5–8.1)

## 2024-02-13 LAB — URINE CULTURE

## 2024-02-13 LAB — GLUCOSE, CAPILLARY
Glucose-Capillary: 181 mg/dL — ABNORMAL HIGH (ref 70–99)
Glucose-Capillary: 170 mg/dL — ABNORMAL HIGH (ref 70–99)
Glucose-Capillary: 172 mg/dL — ABNORMAL HIGH (ref 70–99)
Glucose-Capillary: 151 mg/dL — ABNORMAL HIGH (ref 70–99)
Glucose-Capillary: 168 mg/dL — ABNORMAL HIGH (ref 70–99)
Glucose-Capillary: 149 mg/dL — ABNORMAL HIGH (ref 70–99)

## 2024-02-13 LAB — ECHOCARDIOGRAM COMPLETE
AR max vel: 1.34 cm2
AV Area VTI: 1.33 cm2
AV Area mean vel: 1.12 cm2
AV Mean grad: 8 mmHg
AV Peak grad: 11.7 mmHg
Ao pk vel: 1.71 m/s
Area-P 1/2: 5.54 cm2
Height: 60 in
S' Lateral: 2.2 cm
Weight: 1984 [oz_av]

## 2024-02-13 LAB — CBC
HCT: 37.3 % (ref 36.0–46.0)
Hemoglobin: 12.3 g/dL (ref 12.0–15.0)
MCH: 31.1 pg (ref 26.0–34.0)
MCHC: 33 g/dL (ref 30.0–36.0)
MCV: 94.2 fL (ref 80.0–100.0)
Platelets: 197 10*3/uL (ref 150–400)
RBC: 3.96 MIL/uL (ref 3.87–5.11)
RDW: 12.9 % (ref 11.5–15.5)
WBC: 8.7 10*3/uL (ref 4.0–10.5)
nRBC: 0 % (ref 0.0–0.2)

## 2024-02-13 LAB — PROCALCITONIN: Procalcitonin: 12.1 ng/mL

## 2024-02-13 MED ORDER — BOOST / RESOURCE BREEZE PO LIQD CUSTOM
1.0000 | Freq: Three times a day (TID) | ORAL | Status: DC
  Administered 2024-02-13 – 2024-02-19 (×11): 1 via ORAL

## 2024-02-13 MED ORDER — PROCHLORPERAZINE EDISYLATE 10 MG/2ML IJ SOLN
10.0000 mg | Freq: Four times a day (QID) | INTRAMUSCULAR | Status: DC | PRN
  Administered 2024-02-13: 10 mg via INTRAVENOUS
  Filled 2024-02-13: qty 2

## 2024-02-13 MED ORDER — METOPROLOL TARTRATE 25 MG PO TABS
25.0000 mg | ORAL_TABLET | Freq: Two times a day (BID) | ORAL | Status: DC
  Administered 2024-02-13 – 2024-02-15 (×5): 25 mg via ORAL
  Filled 2024-02-13 (×5): qty 1

## 2024-02-13 MED ORDER — ENOXAPARIN SODIUM 30 MG/0.3ML IJ SOSY
30.0000 mg | PREFILLED_SYRINGE | Freq: Every day | INTRAMUSCULAR | Status: DC
  Administered 2024-02-13: 30 mg via SUBCUTANEOUS
  Filled 2024-02-13: qty 0.3

## 2024-02-13 MED ORDER — SODIUM CHLORIDE 0.9 % IV SOLN
2.0000 g | Freq: Two times a day (BID) | INTRAVENOUS | Status: DC
  Administered 2024-02-13 – 2024-02-18 (×10): 2 g via INTRAVENOUS
  Filled 2024-02-13 (×10): qty 12.5

## 2024-02-13 MED ORDER — ONDANSETRON HCL 4 MG/2ML IJ SOLN
4.0000 mg | Freq: Four times a day (QID) | INTRAMUSCULAR | Status: DC | PRN
  Administered 2024-02-13: 4 mg via INTRAVENOUS
  Filled 2024-02-13 (×2): qty 2

## 2024-02-13 MED ORDER — PANTOPRAZOLE SODIUM 40 MG PO TBEC
40.0000 mg | DELAYED_RELEASE_TABLET | Freq: Every day | ORAL | Status: DC
  Administered 2024-02-13 – 2024-02-19 (×7): 40 mg via ORAL
  Filled 2024-02-13 (×7): qty 1

## 2024-02-13 MED ORDER — ADULT MULTIVITAMIN W/MINERALS CH
1.0000 | ORAL_TABLET | Freq: Every day | ORAL | Status: DC
  Administered 2024-02-13 – 2024-02-19 (×7): 1 via ORAL
  Filled 2024-02-13 (×7): qty 1

## 2024-02-13 MED ORDER — ENOXAPARIN SODIUM 40 MG/0.4ML IJ SOSY
40.0000 mg | PREFILLED_SYRINGE | Freq: Every day | INTRAMUSCULAR | Status: DC
  Administered 2024-02-14 – 2024-02-15 (×2): 40 mg via SUBCUTANEOUS
  Filled 2024-02-13 (×2): qty 0.4

## 2024-02-13 NOTE — Plan of Care (Signed)

## 2024-02-13 NOTE — Plan of Care (Signed)
  Problem: Health Behavior/Discharge Planning: Goal: Ability to manage health-related needs will improve Outcome: Progressing   Problem: Clinical Measurements: Goal: Ability to maintain clinical measurements within normal limits will improve Outcome: Progressing   Problem: Activity: Goal: Risk for activity intolerance will decrease Outcome: Progressing   Problem: Nutrition: Goal: Adequate nutrition will be maintained Outcome: Progressing   Problem: Coping: Goal: Level of anxiety will decrease Outcome: Progressing   Problem: Elimination: Goal: Will not experience complications related to bowel motility Outcome: Progressing   Problem: Safety: Goal: Ability to remain free from injury will improve Outcome: Progressing   Problem: Coping: Goal: Ability to adjust to condition or change in health will improve Outcome: Progressing

## 2024-02-13 NOTE — Progress Notes (Signed)
 PROGRESS NOTE        PATIENT DETAILS Name: [name] [name] Age: 83 y.o. Sex: female Date of Birth: 1941-07-01 Admit Date: 02/12/2024 Admitting Physician Lavanda Porter, MD [id number], Cody Das, NP  Brief Summary: Patient is a 83 y.o.  female with history of CAD-multiple PCI's-last 2022, bacterial endocarditis, HTN, HLD, DM-2 who presented to the ED with weakness, confusion, headache, dizziness-upon further evaluation-she was found to have sepsis physiology secondary to complicated UTI.  Significant events: 6/11>> admit to [facility]  Significant studies: 6/11>> CT head: No acute intracranial abnormality 6/11>> CTA chest: No PE, unchanged left lung nodule. 6/11>> CT abdomen/pelvis: Left perinephric stranding-secondary to past calculi or ascending UTI.  Significant microbiology data: 6/11>> COVID/influenza/RSV PCR: Negative 6/11>> urine culture: Pending 6/11>> blood culture: No growth  Procedures: None  Consults: None  Subjective: Feels weak-but seems to be much more awake and alert compared to what was described in H&P.  Acknowledges dysuria.  Continues to have some nausea.  Objective: Vitals: Blood pressure (!) 156/70, pulse 90, temperature 98.4 F (36.9 C), temperature source Axillary, resp. rate 18, height 5' (1.524 m), weight 56.2 kg, SpO2 94%.   Exam: Gen Exam:Alert awake-not in any distress-but frail/chronically sick appearing. HEENT:atraumatic, normocephalic Chest: B/L clear to auscultation anteriorly CVS:S1S2 regular Abdomen:soft non tender, non distended-mild bilateral CVA tenderness. Extremities:no edema Neurology: Non focal Skin: no rash  Pertinent Labs/Radiology:    Latest Ref Rng & Units 02/12/2024   10:55 AM 02/12/2024   10:39 AM 01/29/2024    2:03 PM  CBC  WBC 4.0 - 10.5 K/uL  12.6  8.7   Hemoglobin 12.0 - 15.0 g/dL 59.5  63.8  75.6   Hematocrit 36.0 - 46.0 % 45.0  44.1  38.7   Platelets 150 - 400 K/uL  233  277     Lab Results   Component Value Date   NA 132 (L) 02/12/2024   K 4.3 02/12/2024   CL 94 (L) 02/12/2024   CO2 22 02/12/2024      Assessment/Plan: Sepsis secondary to complicated UTI/nephritis (POA) Low-grade fever overnight-seems to be slowly improving Awaiting morning labs Blood cultures negative so far Urine cultures are still pending Since blood cultures negative-stop vancomycin -do not think she needs anaerobic coverage-stop Flagyl. Continue Azactam and await cultures.  Dizziness Check orthostatics Mobilize with PT/OT CT head negative.  Acute metabolic encephalopathy Likely secondary to sepsis/UTI. Lethargic/confused on initial presentation-change from prior baseline Seems to be slowly improving-more alert-still pretty weak-continue to treat underlying UTI/sepsis physiology.  Chronic HFpEF Relatively euvolemic Continue beta-blocker Echo pending-ordered by admitting MD.  CAD-history of multiple PCI's-last in 2022. No anginal symptoms Continue DAPT along with statin/metoprolol   HTN BP stable Continue Toprol  with holding parameters.  Reported remote history of bacterial endocarditis (approximately 4-5 years ago when she was living in [place] ) Thankfully blood cultures negative so far Follow blood [name] until final.  CKD stage IIIb Creatinine close to baseline. Trend electrolytes.  DM-2 (A1c 7.3 on 6/11) SSI while inpatient Managed with diet at home  Recent Labs    02/12/24 2012 02/12/24 2320 02/13/24 0314  GLUCAP 228* 171* 181*     GERD PPI  Left reverse shoulder arthroplasty planned with [facility] Recently evaluated by cardiology-felt to be acceptable risk to proceed with surgery.  Left lung nodule: Seen incidentally Unchanged 1.4 x 1.9 cm nodule-from prior studies per radiology report  Repeat chest in 3 to 6 months recommended by radiology  Debility/deconditioning Secondary to acute illness/sepsis physiology PT/OT eval.   Code status:   Code Status:  Limited: Do not attempt resuscitation (DNR) -DNR-LIMITED -Do Not Intubate/DNI    DVT Prophylaxis: enoxaparin  (LOVENOX ) injection 40 mg Start: 02/13/24 0745   Family Communication: Daughter in Social worker at bedside   Disposition Plan: Status is: Inpatient Remains inpatient appropriate because: Severity of illness   Planned Discharge Destination: SNF   Diet: Diet Order             Diet Heart Room service appropriate? Yes; Fluid consistency: Thin  Diet effective now                     Antimicrobial agents: Anti-infectives (From admission, onward)    Start     Dose/Rate Route Frequency Ordered Stop   02/14/24 1100  vancomycin  (VANCOCIN ) IVPB 1000 mg/200 mL premix  Status:  Discontinued        1,000 mg 200 mL/hr over 60 Minutes Intravenous Every 48 hours 02/12/24 1447 02/13/24 0647   02/12/24 2300  aztreonam (AZACTAM) 1 g in sodium chloride  0.9 % 100 mL IVPB        1 g 200 mL/hr over 30 Minutes Intravenous Every 8 hours 02/12/24 1341     02/12/24 2300  metroNIDAZOLE (FLAGYL) IVPB 500 mg  Status:  Discontinued        500 mg 100 mL/hr over 60 Minutes Intravenous Every 12 hours 02/12/24 1341 02/13/24 0647   02/12/24 1100  aztreonam (AZACTAM) 2 g in sodium chloride  0.9 % 100 mL IVPB        2 g 200 mL/hr over 30 Minutes Intravenous  Once 02/12/24 1050 02/12/24 1212   02/12/24 1100  metroNIDAZOLE (FLAGYL) IVPB 500 mg        500 mg 100 mL/hr over 60 Minutes Intravenous  Once 02/12/24 1050 02/12/24 1212   02/12/24 1100  vancomycin  (VANCOCIN ) IVPB 1000 mg/200 mL premix        1,000 mg 200 mL/hr over 60 Minutes Intravenous  Once 02/12/24 1050 02/12/24 1212        MEDICATIONS: Scheduled Meds:  aspirin  EC  81 mg Oral q morning   atorvastatin   80 mg Oral Daily   Chlorhexidine  Gluconate Cloth  6 each Topical Daily   clopidogrel   75 mg Oral q morning   enoxaparin  (LOVENOX ) injection  40 mg Subcutaneous Q24H   ezetimibe   10 mg Oral q morning   insulin  aspart  0-9 Units  Subcutaneous TID WC   metoprolol  tartrate  25 mg Oral BID   pantoprazole   40 mg Oral Q1200   venlafaxine  XR  37.5 mg Oral q morning   Continuous Infusions:  aztreonam 1 g (02/13/24 0527)   PRN Meds:.acetaminophen  **OR** acetaminophen    I have personally reviewed following labs and imaging studies  LABORATORY DATA: CBC: Recent Labs  Lab 02/12/24 1039 02/12/24 1055  WBC 12.6*  --   NEUTROABS 10.9*  --   HGB 13.9 15.3*  HCT 44.1 45.0  MCV 98.4  --   PLT 233  --     Basic Metabolic Panel: Recent Labs  Lab 02/12/24 1039 02/12/24 1055  NA 131* 132*  K 4.4 4.3  CL 97* 94*  CO2 22  --   GLUCOSE 330* 333*  BUN 25* 28*  CREATININE 1.30* 1.20*  CALCIUM  8.4*  --     GFR: Estimated Creatinine Clearance: 27.4 mL/min (A) (by C-G  formula based on SCr of 1.2 mg/dL (H)).  Liver Function Tests: Recent Labs  Lab 02/12/24 1039  AST 22  ALT 17  ALKPHOS 72  BILITOT 0.8  PROT 6.6  ALBUMIN 3.0*   No results for input(s): LIPASE, AMYLASE in the last 168 hours. No results for input(s): AMMONIA in the last 168 hours.  Coagulation Profile: Recent Labs  Lab 02/12/24 1039  INR 1.2    Cardiac Enzymes: No results for input(s): CKTOTAL, CKMB, CKMBINDEX, TROPONINI in the last 168 hours.  BNP (last 3 results) No results for input(s): PROBNP in the last 8760 hours.  Lipid Profile: No results for input(s): CHOL, HDL, LDLCALC, TRIG, CHOLHDL, LDLDIRECT in the last 72 hours.  Thyroid  Function Tests: No results for input(s): TSH, T4TOTAL, FREET4, T3FREE, THYROIDAB in the last 72 hours.  Anemia Panel: No results for input(s): VITAMINB12, FOLATE, FERRITIN, TIBC, IRON, RETICCTPCT in the last 72 hours.  Urine analysis:    Component Value Date/Time   COLORURINE YELLOW 02/12/2024 1149   APPEARANCEUR HAZY (A) 02/12/2024 1149   LABSPEC 1.015 02/12/2024 1149   PHURINE 5.0 02/12/2024 1149   GLUCOSEU 50 (A) 02/12/2024 1149    GLUCOSEU NEGATIVE 03/05/2022 0841   HGBUR NEGATIVE 02/12/2024 1149   BILIRUBINUR NEGATIVE 02/12/2024 1149   BILIRUBINUR negative 03/10/2015 1516   KETONESUR 5 (A) 02/12/2024 1149   PROTEINUR 100 (A) 02/12/2024 1149   UROBILINOGEN 1.0 03/05/2022 0841   NITRITE POSITIVE (A) 02/12/2024 1149   LEUKOCYTESUR TRACE (A) 02/12/2024 1149    Sepsis Labs: Lactic Acid, Venous    Component Value Date/Time   LATICACIDVEN 1.4 02/12/2024 2118    MICROBIOLOGY: Recent Results (from the past 240 hours)  Blood Culture (routine x 2)     Status: None (Preliminary result)   Collection Time: 02/12/24 10:38 AM   Specimen: BLOOD RIGHT FOREARM  Result Value Ref Range Status   Specimen Description BLOOD RIGHT FOREARM  Final   Special Requests   Final    BOTTLES DRAWN AEROBIC AND ANAEROBIC Blood Culture results may not be optimal due to an inadequate volume of blood received in culture bottles   Culture   Final    NO GROWTH < 24 HOURS Performed at [facility], [address], [place], [place] [address]    Report Status PENDING  Incomplete  Blood Culture (routine x 2)     Status: None (Preliminary result)   Collection Time: 02/12/24 10:45 AM   Specimen: BLOOD  Result Value Ref Range Status   Specimen Description BLOOD SITE NOT SPECIFIED  Final   Special Requests   Final    BOTTLES DRAWN AEROBIC AND ANAEROBIC Blood Culture results may not be optimal due to an inadequate volume of blood received in culture bottles   Culture   Final    NO GROWTH < 24 HOURS Performed at [facility], [address], [place], [place] [address]    Report Status PENDING  Incomplete  Resp panel by RT-PCR (RSV, Flu A&B, Covid) Anterior Nasal Swab     Status: None   Collection Time: 02/12/24 11:06 AM   Specimen: Anterior Nasal Swab  Result Value Ref Range Status   SARS Coronavirus 2 by RT PCR NEGATIVE NEGATIVE Final   Influenza A by PCR NEGATIVE NEGATIVE Final   Influenza B by PCR NEGATIVE NEGATIVE Final     Comment: (NOTE) The Xpert Xpress SARS-CoV-2/FLU/RSV plus assay is intended as an aid in the diagnosis of influenza from Nasopharyngeal swab specimens  and should not be used as a sole basis for treatment. Nasal washings and aspirates are unacceptable for Xpert Xpress SARS-CoV-2/FLU/RSV testing.  Fact Sheet for Patients: BloggerCourse.com  Fact Sheet for Healthcare Providers: SeriousBroker.it  This test is not yet approved or cleared by the [place]  FDA and has been authorized for detection and/or diagnosis of SARS-CoV-2 by FDA under an Emergency Use Authorization (EUA). This EUA will remain in effect (meaning this test can be used) for the duration of the COVID-19 declaration under Section 564(b)(1) of the Act, 21 U.S.C. section 360bbb-3(b)(1), unless the authorization is terminated or revoked.     Resp Syncytial Virus by PCR NEGATIVE NEGATIVE Final    Comment: (NOTE) Fact Sheet for Patients: BloggerCourse.com  Fact Sheet for Healthcare Providers: SeriousBroker.it  This test is not yet approved or cleared by the [place]  FDA and has been authorized for detection and/or diagnosis of SARS-CoV-2 by FDA under an Emergency Use Authorization (EUA). This EUA will remain in effect (meaning this test can be used) for the duration of the COVID-19 declaration under Section 564(b)(1) of the Act, 21 U.S.C. section 360bbb-3(b)(1), unless the authorization is terminated or revoked.  Performed at [facility], [address], [place], [place] [address]   Urine Culture     Status: None (Preliminary result)   Collection Time: 02/12/24 11:49 AM   Specimen: Urine, Catheterized  Result Value Ref Range Status   Specimen Description URINE, CATHETERIZED  Final   Special Requests   Final    NONE Reflexed from [id number] Performed at [facility], [address], [place],  [place] [address]    Culture PENDING  Incomplete   Report Status PENDING  Incomplete    RADIOLOGY STUDIES/RESULTS: CT ABDOMEN PELVIS W CONTRAST Result Date: 02/12/2024 CLINICAL DATA:  Sepsis, dizziness EXAM: CT ABDOMEN AND PELVIS WITH CONTRAST TECHNIQUE: Multidetector CT imaging of the abdomen and pelvis was performed using the standard protocol following bolus administration of intravenous contrast. RADIATION DOSE REDUCTION: This exam was performed according to the departmental dose-optimization program which includes automated exposure control, adjustment of the mA and/or kV according to patient size and/or use of iterative reconstruction technique. CONTRAST:  65mL OMNIPAQUE  IOHEXOL  350 MG/ML SOLN COMPARISON:  None available. FINDINGS: Motion degraded study. Lower chest: For findings above the diaphragm, please see the separately dictated CT of the chest report, which was performed concurrently. Hepatobiliary: No mass.Cholecystectomy. No intrahepatic or extrahepatic biliary ductal dilation. The portal veins are patent. Pancreas: No mass or main ductal dilation. No peripancreatic inflammation or fluid collection. Spleen: Normal size. No mass. Adrenals/Urinary Tract: No adrenal masses. No renal mass. Somewhat asymmetric enhancement of the renal parenchyma with decreased enhancement noted in the left mid and upper portions. Left perinephric stranding. No nephrolithiasis or hydronephrosis. The urinary bladder is distended without focal abnormality. Stomach/Bowel: Small hiatal hernia. The stomach contains ingested material without focal abnormality. No small bowel wall thickening or inflammation. No small bowel obstruction.The appendix was not visualized. No right lower quadrant or pericecal inflammatory changes to suggest acute appendicitis. Subtle inflammatory stranding about the splenic flexure of the colon. Vascular/Lymphatic: No aortic aneurysm. Diffuse aortoiliac atherosclerosis. No intraabdominal or pelvic  lymphadenopathy. Reproductive: Hysterectomy. No concerning adnexal mass.No free pelvic fluid. Other: No pneumoperitoneum, ascites, or mesenteric inflammation. Musculoskeletal: No acute fracture or destructive lesion. Osteopenia. Multilevel degenerative disc disease of the spine. Mild bilateral hip osteoarthritis. IMPRESSION: 1. Left perinephric stranding with somewhat asymmetric enhancement of the left renal parenchyma, which may be due  to a recently passed calculus or an ascending urinary tract infection. Correlation with urinalysis recommended. 2. Subtle inflammatory stranding about the splenic flexure of the colon, which may be reactive to the left perinephric stranding. Alternatively, a focal infectious or inflammatory colitis could also have this appearance in the correct clinical context. Aortic Atherosclerosis (ICD10-I70.0). Electronically Signed   By: Rance Burrows M.D.   On: 02/12/2024 16:22   CT Angio Chest Pulmonary Embolism (PE) W or WO Contrast Result Date: 02/12/2024 CLINICAL DATA:  Pulmonary embolism (PE) suspected, high prob sepsis. EXAM: CT ANGIOGRAPHY CHEST WITH CONTRAST TECHNIQUE: Multidetector CT imaging of the chest was performed using the standard protocol during bolus administration of intravenous contrast. Multiplanar CT image reconstructions and MIPs were obtained to evaluate the vascular anatomy. RADIATION DOSE REDUCTION: This exam was performed according to the departmental dose-optimization program which includes automated exposure control, adjustment of the mA and/or kV according to patient size and/or use of iterative reconstruction technique. CONTRAST:  65mL OMNIPAQUE  IOHEXOL  350 MG/ML SOLN COMPARISON:  June 11, 2023 FINDINGS: Motion degraded study. Pulmonary Embolism: No pulmonary embolism to the level of the proximal segmental pulmonary arteries bilaterally. The distal segmental and subsegmental branches are degraded by motion. Cardiovascular: No cardiomegaly or pericardial  effusion. No aortic aneurysm. Diffuse atherosclerosis. Multi-vessel coronary atherosclerosis. Mediastinum/Nodes: No mediastinal mass. No mediastinal, hilar, or axillary lymphadenopathy. Small sliding-type hiatal hernia. Lungs/Pleura: The midline trachea and bronchi are patent. No focal airspace consolidation, pleural effusion, or pneumothorax. Unchanged 1.4 x 1.9 cm nodule in the medial left lung apex (axial 15). The solid subpleural nodule in the lateral right lower lobe (axial 82 previously), is now more ground-glass than on the prior study. Musculoskeletal: No acute fracture or destructive bone lesion. Anterior and posterior cervical fusion hardware is partially visualized. Osteopenia. Multilevel degenerative disc disease of the spine. Severe left glenohumeral joint osteoarthritis with moderate right glenohumeral joint osteoarthritis. Upper Abdomen: For findings below the diaphragm, please see the separately dictated CT of the abdomen and pelvis report, which was performed concurrently. Review of the MIP images confirms the above findings. IMPRESSION: 1. No pulmonary embolism to the level of the proximal segmental pulmonary arteries bilaterally. The distal segmental and subsegmental branches are degraded by motion. 2. No pneumonia, pulmonary edema, or pleural effusion. 3. Unchanged 1.4 x 1.9 cm nodule in the medial left lung apex (axial 15). The solid subpleural nodule in the lateral right lower lobe is more ground-glass than on the prior study. A repeat chest CT in 3-6 months is recommended. 4. Small sliding-type hiatal hernia. Aortic Atherosclerosis (ICD10-I70.0). Electronically Signed   By: Rance Burrows M.D.   On: 02/12/2024 16:07   CT Head Wo Contrast Result Date: 02/12/2024 CLINICAL DATA:  83 year old female with altered mental status, dizziness, confusion, headache times 2 days. EXAM: CT HEAD WITHOUT CONTRAST TECHNIQUE: Contiguous axial images were obtained from the base of the skull through the  vertex without intravenous contrast. RADIATION DOSE REDUCTION: This exam was performed according to the departmental dose-optimization program which includes automated exposure control, adjustment of the mA and/or kV according to patient size and/or use of iterative reconstruction technique. COMPARISON:  Head CT 10/22/2023. FINDINGS: Brain: Cerebral volume is within normal limits for age. No midline shift, ventriculomegaly, mass effect, evidence of mass lesion, intracranial hemorrhage or evidence of cortically based acute infarction. Patchy bilateral cerebral white matter hypodensity. Chronic heterogeneity in the deep gray nuclei, more so on the left. Stable gray-white matter differentiation throughout the brain. Vascular: No suspicious intracranial vascular  hyperdensity. Calcified atherosclerosis at the skull base. Skull: Stable. 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 No acute osseous abnormality identified. Sinuses/Orbits: Visualized paranasal sinuses and mastoids are clear. Other: No acute orbit or scalp soft tissue finding. IMPRESSION: 1. No acute intracranial abnormality. 2. 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 Electronically Signed   By: Marlise Simpers M.D.   On: 02/12/2024 12:11   DG Chest Port 1 View Result Date: 02/12/2024 CLINICAL DATA:  Fever. EXAM: PORTABLE CHEST 1 VIEW COMPARISON:  Chest radiograph dated 09/26/2022. FINDINGS: No focal consolidation, pleural effusion or pneumothorax. The cardiac silhouette is within limits. Small metallic clip in the left upper lobe similar to prior radiograph. Atherosclerotic calcification of the aorta. No acute osseous pathology. IMPRESSION: No active disease. Electronically Signed   By: Angus Bark M.D.   On: 02/12/2024 11:31     LOS: 1 day   Kimberly Penna, MD  [facility]    To contact the attending provider between 7A-7P or the covering provider during after hours 7P-7A, please log into the web site  [email or web address] and access using universal [place] password for that web site. If you do not have the password, please call the hospital operator.  02/13/2024, 6:49 AM

## 2024-02-13 NOTE — Evaluation (Addendum)
 Physical Therapy Evaluation Patient Details Name: [name] [name] MRN: [id number] DOB: 05/01/1941 Today's Date: 02/13/2024     Clinical Impression  [name] [name] is 83 y.o. female admitted with above HPI and diagnosis. Patient is currently limited by functional impairments below (see PT problem list). Patient lives with granddaughter and her 2 small children and typically is mobilizing in home at mod ind level but does not always use her RW or SPC at baseline. Pt is limited by weakness and dizzy sensation at rest and with mobility. Pt reports a history of vertigo however due to slight confusion and shaking deferred full vestibular testing. Pt required Mod assist for supine>sit and sit<>stand with transfer to recliner. Pt restless sitting in recliner and ultimately returned to bed with Mod HHA. Patient will benefit from continued skilled PT interventions to address impairments and progress independence with mobility. Patient will benefit from continued inpatient follow up therapy, <3 hours/day. Acute PT will follow and progress as able.        PT Visit Information  Last PT Received On 02/13/24  Assistance Needed +1  History of Present Illness Patient is 83 y.o. female who presented to the ED with weakness, confusion, headache, dizziness. In ED workup revealed sepsis physiology secondary to complicated UTI. PMH significant for DMII, HTN, HLD, chronic diastolic heart failure, CAD, chronic hyponatremia, hxo f Stroke,  Precautions  Precautions Fall  Precaution/Restrictions Comments frequent falls? ( moves in last month to neices home)  Restrictions  Weight Bearing Restrictions Per Provider Order No  Home Living  Family/patient expects to be discharged to: Private residence (pt moved in with granddaughter ~ 1 month ago and daughter has not been able to assist her as much as they live farther apart now)  Living Arrangements Other relatives  Available Help at Discharge Family;Available  PRN/intermittently;Available 24 hours/day  Type of Home House  Home Access Level entry  Home Layout One level  Bathroom Shower/Tub Tub/shower unit  Horticulturist, commercial Yes  Home Equipment Grab bars - tub/shower  Prior Function  Mobility Comments have a SPC and RW but doesn't use them much, uses if feels dizzy, just once in a while  ADLs Comments Wash up every other day, Alyssa her granddaughter helps her with bathing  Pain Assessment  Pain Assessment No/denies pain  Breathing 0  Negative Vocalization 0  Facial Expression 0  Body Language 0  Consolability 0  PAINAD Score 0  Cognition  Arousal Alert  Behavior During Therapy Flat affect  PT - Cognitive impairments Awareness;Sequencing;Problem solving  Following Commands  Following commands Impaired  Cueing  Cueing Techniques Verbal cues;Gestural cues  Communication  Communication Impaired  Factors Affecting Communication Hearing impaired  Upper Extremity Assessment  Upper Extremity Assessment Defer to OT evaluation  Lower Extremity Assessment  Lower Extremity Assessment Generalized weakness  Cervical / Trunk Assessment  Cervical / Trunk Assessment Kyphotic  Bed Mobility  Overal bed mobility Needs Assistance  Bed Mobility Supine to Sit;Sit to Supine  Supine to sit Mod assist;Used rails;HOB elevated  Sit to supine Mod assist;HOB elevated;Used rails  Transfers  Overall transfer level Needs assistance  Equipment used 1 person hand held assist  Transfers Sit to/from Stand;Bed to chair/wheelchair/BSC  Sit to Stand Mod assist  Bed to/from chair/wheelchair/BSC transfer type: Step pivot  Step pivot transfers Mod assist  PT - End of Session  Equipment Utilized During Treatment Gait belt  Activity Tolerance Patient tolerated treatment well;Patient limited by fatigue  Patient left in  bed;with call bell/phone within reach;with bed alarm set;with family/visitor present  Nurse Communication Mobility  status  PT Assessment  PT Recommendation/Assessment Patient needs continued PT services  PT Visit Diagnosis Unsteadiness on feet (R26.81);Repeated falls (R29.6);Muscle weakness (generalized) (M62.81);Other abnormalities of gait and mobility (R26.89);Difficulty in walking, not elsewhere classified (R26.2);Other symptoms and signs involving the nervous system (R29.898)  PT Problem List Decreased strength;Decreased range of motion;Decreased activity tolerance;Decreased balance;Decreased mobility;Decreased cognition;Decreased knowledge of use of DME;Decreased safety awareness;Decreased knowledge of precautions;Cardiopulmonary status limiting activity  PT Plan  PT Frequency (ACUTE ONLY) Min 2X/week  PT Treatment/Interventions (ACUTE ONLY) DME instruction;Gait training;Stair training;Functional mobility training;Therapeutic activities;Therapeutic exercise;Balance training;Neuromuscular re-education;Cognitive remediation;Patient/family education  AM-PAC PT 6 Clicks Mobility Outcome Measure (Version 2)  Help needed turning from your back to your side while in a flat bed without using bedrails? 2  Help needed moving from lying on your back to sitting on the side of a flat bed without using bedrails? 2  Help needed moving to and from a bed to a chair (including a wheelchair)? 2  Help needed standing up from a chair using your arms (e.g., wheelchair or bedside chair)? 2  Help needed to walk in hospital room? 1  Help needed climbing 3-5 steps with a railing?  1  6 Click Score 10  Consider Recommendation of Discharge To: CIR/SNF/LTACH  Progressive Mobility  What is the highest level of mobility based on the progressive mobility assessment? Level 3 (Stands with assist) - Balance while standing  and cannot march in place  Mobility Referral Yes  Activity Stood at bedside;Transferred from bed to chair;Transferred from chair to bed  PT Recommendation  Follow Up Recommendations Skilled nursing-short term rehab  (<3 hours/day)  Can patient physically be transported by private vehicle No  Patient can return home with the following A lot of help with walking and/or transfers;A lot of help with bathing/dressing/bathroom;Assistance with cooking/housework;Direct supervision/assist for medications management;Assist for transportation;Help with stairs or ramp for entrance;Supervision due to cognitive status  Functional Status Assessment Patient has had a recent decline in their functional status and demonstrates the ability to make significant improvements in function in a reasonable and predictable amount of time.  PT equipment  (TBA)  Individuals Consulted  Consulted and Agree with Results and Recommendations Patient;Family member/caregiver  Family Member Consulted pt's daughter present  Acute Rehab PT Goals  Patient Stated Goal get stronger, stop falling  PT Goal Formulation With patient/family  Time For Goal Achievement 02/27/24  Potential to Achieve Goals Fair  PT Time Calculation  PT Start Time (ACUTE ONLY) 1013  PT Stop Time (ACUTE ONLY) 1045  PT Time Calculation (min) (ACUTE ONLY) 32 min  PT General Charges  $$ ACUTE PT VISIT 1 Visit  PT Evaluation  $PT Eval Moderate Complexity 1 Mod  PT Treatments  $Therapeutic Activity 8-22 mins     Tish Forge, DPT Acute Rehabilitation Services Office [phone]  02/13/24 7:28 PM

## 2024-02-13 NOTE — Progress Notes (Addendum)
 Initial Nutrition Assessment  DOCUMENTATION CODES:  Severe malnutrition in context of chronic illness  INTERVENTION:  Add Boost Breeze po TID, each supplement provides 250 kcal and 9 grams of protein  Add Magic cup TID with meals, each supplement provides 290 kcal and 9 grams of protein  Add MVI w/ minerals Discussed importance of consistent calorie and protein intake Assistance w/ ordering meal trays to maximize PO intake/tailor food preferences  NUTRITION DIAGNOSIS:  Moderate Malnutrition related to chronic illness (T2DM, CAD (multiple PCI's)) as evidenced by mild fat depletion, moderate muscle depletion.  GOAL:  Patient will meet greater than or equal to 90% of their needs  MONITOR:  PO intake, Supplement acceptance  REASON FOR ASSESSMENT:  Consult Assessment of nutrition requirement/status  ASSESSMENT:   Pt with PMH significant for: CAD - multiple PCI's (last 2022), bacterial endocarditis, HTN, HLD, T2DM. Presented to ED w/ weakness, confusion, headache, and dizziness. Found to have sepsis 2/2 UTI.  6/11 admitted, CT abd/pelvis: left perinephric stranding 2/2 past calculi or ascending UTI  Spoke with patient and her daughter at bedside today. Both are pleasant and conversant. Patient does seem somewhat confused, at times. She endorses poor intake in days leading up to admission due to nausea. Nausea with breakfast intake this morning. Consumed her scrambled eggs and some Jell-O.  Medications ordered and effective. No problems chewing or swallowing at baseline. Adequate dentition.   Average Meal Intake No meal intake documented to review  Reports stable appetite up to days leading to admission. Takes quite a bit of vitamin/mineral supplements at home. Will start MVI here. Not amicable to traditional oral nutrition supplements. Will try Boost Breeze. Also add Magic Cup to trays to augment intake.   24 Hour Recall B:  skips L: sandwich or whatever granddaughter cooks w/  water or tea D: [employer] OR granddaughter cooks a balanced meal w/ protein, starch, and vegetable w/ water or tea Snacks: rarely  Discussed importance of adequate calorie and protein intake during admission to preserve lean body mass, prevent skin breakdown as well as promote progress with therapies. Also discussed importance of intake once she is discharged. She verbalizes understanding.   Admit/Current Weight: 56.2kg  She endorses UBW as between 120-122lbs and having hovered around this for at least the last year. Per chart review, no significant weight changes in last 6 months. No additional weight history to review. No edema one exam. Bowels stable.  Drains/Lines: Foley catheter UOP: 1.7L x24 hours  Meds: SSI novolog  0-9 TID, pantoprazole , IV ABX  Labs:  Na+ 131 (L) K+ 3.6 (wdl) CRP 13.3 (H) Lactic acid 2.0>4.0>1.4 (wdl) CBGs 184-333 x24 hours A1c 7.3 (02/2024)   NUTRITION - FOCUSED PHYSICAL EXAM:  Flowsheet Row Most Recent Value  Orbital Region Mild depletion  Upper Arm Region Mild depletion  Thoracic and Lumbar Region Mild depletion  Buccal Region No depletion  Temple Region Mild depletion  Clavicle Bone Region Moderate depletion  Clavicle and Acromion Bone Region Moderate depletion  Scapular Bone Region Moderate depletion  Dorsal Hand Mild depletion  Patellar Region Moderate depletion  Anterior Thigh Region Mild depletion  Posterior Calf Region Moderate depletion  Edema (RD Assessment) None  Hair Reviewed  Eyes Reviewed  Mouth Reviewed  Skin Reviewed  Nails Reviewed    Diet Order:   Diet Order             Diet Heart Room service appropriate? Yes; Fluid consistency: Thin  Diet effective now  EDUCATION NEEDS:   Education needs have been addressed  Skin:  Skin Assessment: Reviewed RN Assessment  Last BM:  6/12 - type 4 x1  Height:  Ht Readings from Last 1 Encounters:  02/12/24 5' (1.524 m)   Weight:  Wt Readings from Last 1  Encounters:  02/12/24 56.2 kg   Ideal Body Weight:  45.5 kg  BMI:  Body mass index is 24.22 kg/m.  Estimated Nutritional Needs:   Kcal:  1500-1700 kcals  Protein:  55-70g  Fluid:  >1.5L/day  Con Decant MS, RD, LDN Registered Dietitian Clinical Nutrition RD Inpatient Contact Info in Amion

## 2024-02-13 NOTE — Progress Notes (Signed)
 OT Cancellation Note  Patient Details Name: [name] [name] MRN: [id number] DOB: [birth year]   Cancelled Treatment:    Reason Eval/Treat Not Completed: Fatigue/lethargy limiting ability to participate;Other (comment) Pt w/ increased nausea as well as fatigue after toileting assist. Will follow up for OT eval as schedule permits.   Shireen Dory 02/13/2024, 12:09 PM

## 2024-02-14 DIAGNOSIS — R42 Dizziness and giddiness: Secondary | ICD-10-CM | POA: Diagnosis not present

## 2024-02-14 DIAGNOSIS — E43 Unspecified severe protein-calorie malnutrition: Secondary | ICD-10-CM | POA: Insufficient documentation

## 2024-02-14 LAB — BASIC METABOLIC PANEL WITH GFR
Anion gap: 12 (ref 5–15)
BUN: 13 mg/dL (ref 8–23)
CO2: 26 mmol/L (ref 22–32)
Calcium: 8.2 mg/dL — ABNORMAL LOW (ref 8.9–10.3)
Chloride: 96 mmol/L — ABNORMAL LOW (ref 98–111)
Creatinine, Ser: 0.82 mg/dL (ref 0.44–1.00)
GFR, Estimated: >60 mL/min (ref >60)
Glucose, Bld: 160 mg/dL — ABNORMAL HIGH (ref 70–99)
Potassium: 3.3 mmol/L — ABNORMAL LOW (ref 3.5–5.1)
Sodium: 134 mmol/L — ABNORMAL LOW (ref 135–145)

## 2024-02-14 LAB — MAGNESIUM: Magnesium: 1.8 mg/dL (ref 1.7–2.4)

## 2024-02-14 LAB — PHOSPHORUS: Phosphorus: 2.3 mg/dL — ABNORMAL LOW (ref 2.5–4.6)

## 2024-02-14 LAB — CBC
HCT: 38.5 % (ref 36.0–46.0)
Hemoglobin: 12.6 g/dL (ref 12.0–15.0)
MCH: 30.7 pg (ref 26.0–34.0)
MCHC: 32.7 g/dL (ref 30.0–36.0)
MCV: 93.7 fL (ref 80.0–100.0)
Platelets: 186 10*3/uL (ref 150–400)
RBC: 4.11 MIL/uL (ref 3.87–5.11)
RDW: 12.8 % (ref 11.5–15.5)
WBC: 6.2 10*3/uL (ref 4.0–10.5)
nRBC: 0 % (ref 0.0–0.2)

## 2024-02-14 LAB — GLUCOSE, CAPILLARY
Glucose-Capillary: 166 mg/dL — ABNORMAL HIGH (ref 70–99)
Glucose-Capillary: 168 mg/dL — ABNORMAL HIGH (ref 70–99)
Glucose-Capillary: 236 mg/dL — ABNORMAL HIGH (ref 70–99)
Glucose-Capillary: 153 mg/dL — ABNORMAL HIGH (ref 70–99)

## 2024-02-14 LAB — VITAMIN D 25 HYDROXY (VIT D DEFICIENCY, FRACTURES): Vit D, 25-Hydroxy: 40.93 ng/mL (ref 30–100)

## 2024-02-14 MED ORDER — K PHOS MONO-SOD PHOS DI & MONO 155-852-130 MG PO TABS
500.0000 mg | ORAL_TABLET | Freq: Four times a day (QID) | ORAL | Status: AC
  Administered 2024-02-14 (×3): 500 mg via ORAL
  Filled 2024-02-14 (×3): qty 2

## 2024-02-14 MED ORDER — VENLAFAXINE HCL ER 37.5 MG PO CP24
37.5000 mg | ORAL_CAPSULE | Freq: Every morning | ORAL | Status: DC
  Administered 2024-02-14 – 2024-02-19 (×6): 37.5 mg via ORAL
  Filled 2024-02-14 (×6): qty 1

## 2024-02-14 NOTE — Care Management Important Message (Signed)
 Important Message  Patient Details  Name: [name] [name] MRN: [id number] Date of Birth: 12-05-1940   Important Message Given:  Yes - Medicare IM     Wynonia Hedges 02/14/2024, 3:31 PM

## 2024-02-14 NOTE — NC FL2 (Signed)
 [place]  MEDICAID FL2 LEVEL OF CARE FORM     IDENTIFICATION  Patient Name: [name] [name] Birthdate: [birth year] Sex: female Admission Date (Current Location): 02/12/2024  [facility] and [place] Number:  Producer, television/film/video and Address:  [facility], [address] [place], [place] [address]      Provider Number: [id number]  Attending Physician Name and Address:  Althia Atlas, MD  Relative Name and Phone Number:       Current Level of Care: Hospital Recommended Level of Care: Skilled Nursing Facility Prior Approval Number:    Date Approved/Denied:   PASRR Number: [id number]  Discharge Plan: SNF    Current Diagnoses: Patient Active Problem List   Diagnosis Date Noted   Protein-calorie malnutrition, severe 02/14/2024   Sepsis due to urinary tract infection (HCC) 02/12/2024   Lethargy 02/12/2024   Cellulitis and abscess of right lower extremity 02/14/2023   Chronic diastolic CHF (congestive heart failure) (HCC) 02/14/2023   Abscess 02/13/2023   Elevated lipase 09/27/2022   Anorexia 09/27/2022   Chronic hyponatremia 09/26/2022   Left leg swelling 05/18/2022   Acute post-operative pain 05/18/2022   Multiple pulmonary nodules 03/22/2022   UTI due to Klebsiella species 03/12/2022   Encounter for general adult medical examination with abnormal findings 10/04/2021   Hx of completed stroke 05/12/2021   Coagulation disorder (HCC) 05/12/2021   Stage 3b chronic kidney disease (HCC) 09/13/2020   History of bacterial endocarditis 10/28/2018   Other seasonal allergic rhinitis 07/08/2014   Diabetic peripheral neuropathy (HCC) 06/29/2014   Essential hypertension 08/03/2009   Acute cystitis 11/10/2008   CAD (coronary artery disease) 01/14/2008   Dizziness 10/15/2007   DM2 (diabetes mellitus, type 2) (HCC) 08/15/2007   Hyperlipidemia 06/13/2007   Depression with anxiety 06/13/2007   GERD (gastroesophageal reflux disease) 06/13/2007   Osteoarthritis  06/13/2007    Orientation RESPIRATION BLADDER Height & Weight     Self, Time, Situation, Place  Normal Continent, Indwelling catheter Weight: 124 lb (56.2 kg) Height:  5' (152.4 cm)  BEHAVIORAL SYMPTOMS/MOOD NEUROLOGICAL BOWEL NUTRITION STATUS      Incontinent Diet (See dc summary)  AMBULATORY STATUS COMMUNICATION OF NEEDS Skin   Limited Assist Verbally Normal                       Personal Care Assistance Level of Assistance  Bathing, Feeding, Dressing Bathing Assistance: Limited assistance Feeding assistance: Limited assistance Dressing Assistance: Limited assistance     Functional Limitations Info             SPECIAL CARE FACTORS FREQUENCY  PT (By licensed PT), OT (By licensed OT)     PT Frequency: 5x/week OT Frequency: 5x/week            Contractures Contractures Info: Not present    Additional Factors Info  Code Status, Allergies Code Status Info: DNR Allergies Info: Sulfa  Antibiotics, Niacin  And Related, Codeine, Macrobid (Nitrofurantoin), Prednisone, Rocephin  (Ceftriaxone ), Sulfonamide Derivatives, Tetracycline           Current Medications (02/14/2024):  This is the current hospital active medication list Current Facility-Administered Medications  Medication Dose Route Frequency Provider Last Rate Last Admin   acetaminophen  (TYLENOL ) tablet 650 mg  650 mg Oral Q6H PRN Patel, Ekta V, MD   650 mg at 02/13/24 1243   Or   acetaminophen  (TYLENOL ) suppository 650 mg  650 mg Rectal Q6H PRN Patel, Ekta V, MD       aspirin  EC tablet  81 mg  81 mg Oral q morning Patel, Ekta V, MD   81 mg at 02/14/24 0981   atorvastatin  (LIPITOR ) tablet 80 mg  80 mg Oral Daily Patel, Ekta V, MD   80 mg at 02/14/24 0837   ceFEPIme  (MAXIPIME ) 2 g in sodium chloride  0.9 % 100 mL IVPB  2 g Intravenous Q12H Burton Casey, MD 200 mL/hr at 02/14/24 1332 2 g at 02/14/24 1332   Chlorhexidine  Gluconate Cloth 2 % PADS 6 each  6 each Topical Daily Lavanda Porter, MD   6 each at  02/14/24 1242   clopidogrel  (PLAVIX ) tablet 75 mg  75 mg Oral q morning Patel, Ekta V, MD   75 mg at 02/14/24 1914   enoxaparin  (LOVENOX ) injection 40 mg  40 mg Subcutaneous Daily Burton Casey, MD   40 mg at 02/14/24 7829   ezetimibe  (ZETIA ) tablet 10 mg  10 mg Oral q morning Patel, Ekta V, MD   10 mg at 02/14/24 5621   feeding supplement (BOOST / RESOURCE BREEZE) liquid 1 Container  1 Container Oral TID BM Burton Casey, MD   1 Container at 02/13/24 1536   insulin  aspart (novoLOG ) injection 0-9 Units  0-9 Units Subcutaneous TID WC Patel, Ekta V, MD   3 Units at 02/14/24 1242   metoprolol  tartrate (LOPRESSOR ) tablet 25 mg  25 mg Oral BID Burton Casey, MD   25 mg at 02/14/24 3086   multivitamin with minerals tablet 1 tablet  1 tablet Oral Daily Ghimire, Shanker M, MD   1 tablet at 02/14/24 5784   ondansetron  (ZOFRAN ) injection 4 mg  4 mg Intravenous Q6H PRN Burton Casey, MD   4 mg at 02/13/24 0905   pantoprazole  (PROTONIX ) EC tablet 40 mg  40 mg Oral Q1200 Burton Casey, MD   40 mg at 02/14/24 1241   phosphorus (K PHOS NEUTRAL) tablet 500 mg  500 mg Oral QID Althia Atlas, MD       prochlorperazine  (COMPAZINE ) injection 10 mg  10 mg Intravenous Q6H PRN Burton Casey, MD   10 mg at 02/13/24 1408   venlafaxine  XR (EFFEXOR -XR) 24 hr capsule 37.5 mg  37.5 mg Oral q morning Althia Atlas, MD   37.5 mg at 02/14/24 1241     Discharge Medications: Please see discharge summary for a list of discharge medications.  Relevant Imaging Results:  Relevant Lab Results:   Additional Information SSN: [id number] [place], [place]

## 2024-02-14 NOTE — TOC Initial Note (Signed)
 Transition of Care [facility]) - Initial/Assessment Note    Patient Details  Name: [name] [name] MRN: [id number] Date of Birth: [birth year]  Transition of Care [facility]) CM/SW Contact:    Jannice Mends, LCSW Phone Number: 02/14/2024, 12:36 PM  Clinical Narrative:                 CSW received consult for possible SNF placement at time of discharge. CSW spoke with patient's daughter, [name] [name], who reports she is best contact in the family. She shared that she lives in [place] and lived near patient and was her primary helper until patient moved in with Granddaughter in [place]. Patient's daughter reported that patient's spouse is currently unable to care for patient at their home given patient's current physical needs and fall risk. Patient's daughter expressed understanding of PT recommendation and is agreeable to SNF placement at time of discharge. Patient's daughter reports preference for [place] so it is more convenient for her but family also lives in [place] since it is up to the patient. CSW discussed insurance authorization process and will provide Medicare SNF ratings list. CSW will send out referrals for review and provide bed offers as available.   Skilled Nursing Rehab Facilities-   [email or web address]   Ratings out of 5 stars (5 the highest)  Name Address  Phone # Quality Care Staffing Health Inspection Overall  [facility] [address] [phone] 3 1 4 3   [facility] [address] [place] [phone] 5 2 4 5   [facility] [address], [facility] [phone] 2 1 1 1   [facility] [address] [place] [phone] 4 3 4 4   [facility]  [address], [place] [phone] 5 3 5 5   [facility] [address] [facility] [phone] 5 3 2 3   [facility] [address] [place] [phone] 5 1 2 2   [facility] [address], [place] [phone] 2 3  3 3   [address] ([place]) [address], [place] [phone] 1 3 3 2   [facility] [address] [facility], [place] [phone] 3 1 2 1   [facility] ([place]) [address], [place] [phone] 3 1 1 1   [facility] [address] [place] [phone] 3 3 4 4   [facility] [address] [place] [phone] 4 4 3 3   [facility] ([facility]) [address], [place] [phone] 1 1 4 2           [facility] [address] [place] [phone] 3 1 5 4   [facility] [address] [place] [phone] 4 1 1 1   [facility]  [address], [place] [phone] 2 3 2 2   [facility] [address] [phone] 2 2 4 4   [facility] [address] [place] [address], [place] [phone] 2 2 3 3           [facility] [address], [place] [phone] 2 1 2 1   [facility] (No U[facility]) [address], [place] [phone] 5 4 5 5   [facility] [address], [place] [facility] [phone] 3 4 2 2   [facility] [address] [place] [phone] 3 1 2 1   [place] [address] Verneita Goldmann [phone] 3 2 2 2   [facility] [address], [place] [phone] 1 3 3 2   [facility] [address], [place] [phone] 2 2 3 3   [facility] [address], [place] [phone] 2 1 4 3   [facility] [address], [facility] [phone] 2 1 1 1   [facility] [address] [place], [place] [phone] 2 1 2 1   [facility] [address] [place] [phone] 3 1 2 1           [facility] [address], [place] [phone] 4 1 2 1   [facility] [address], [place]  [phone] 3 4 4 4   [facility] (No Humana) [address], [place] [phone] 3 2 4 4   [facility]) [address], [place] [phone] 2 2 3 3   [facility] [address] [place] [phone] 5 3 5 5   [facility] [address], [place] [phone] 2 1 1 1   [facility] [address] [place], [place] [phone] 3 5 5 5           [facility] [address] [place], [place] [phone] 5 4 5 5   [facility])  [address] [place] [phone] 1 1 2 1   [facility]) [address], [place] [phone]  2 4 4   [facility] [address] [place] [phone] 3 5 5 5   [address] [place], [place] [phone] 4 2 2 2   [facility]) [address] [place] [phone] 1 1 3 1      Expected Discharge Plan: Skilled Nursing Facility Barriers to Discharge: Continued Medical Work up, English as a second language teacher, SNF Pending bed offer   Patient Goals and CMS Choice Patient states their goals for this hospitalization and ongoing recovery are:: Rehab CMS Medicare.gov Compare Post Acute Care list provided to:: Patient Represenative (must comment) Choice offered to / list presented to : Adult Children [place] ownership interest in [facility].provided to:: Adult Children    Expected Discharge Plan and Services In-house Referral: Clinical Social Work   Post Acute Care Choice: Skilled Nursing Facility Living arrangements for the past 2 months: Single Family Home                                      Prior Living Arrangements/Services Living arrangements for the past 2 months: Single Family Home Lives with:: Relatives Patient language and need for interpreter reviewed:: Yes Do you feel safe going back to the place where you live?: Yes      Need for Family Participation in Patient Care: Yes (Comment) Care giver support system in place?: Yes (comment) Current home services: DME (Rollator, cane) Criminal Activity/Legal Involvement Pertinent to Current Situation/Hospitalization: No - Comment as  needed  Activities of Daily Living      Permission Sought/Granted Permission sought to share information with : Facility Medical sales representative, Family Supports Permission granted to share information with : Yes, Verbal Permission Granted  Share Information with NAME: [name] [name]  Permission granted to share info w AGENCY: SNFs  Permission granted to share info w Relationship: Daughter  Permission granted to share info w Contact Information: [phone]  Emotional Assessment Appearance:: Appears stated age     Orientation: : Oriented to Self, Oriented to Place, Oriented to  Time, Oriented to Situation Alcohol  / Substance Use: Not Applicable Psych Involvement: No (comment)  Admission diagnosis:  Lethargy [R53.83] Febrile urinary tract infection [N39.0] Patient Active Problem List   Diagnosis Date Noted  Protein-calorie malnutrition, severe 02/14/2024   Sepsis due to urinary tract infection (HCC) 02/12/2024   Lethargy 02/12/2024   Cellulitis and abscess of right lower extremity 02/14/2023   Chronic diastolic CHF (congestive heart failure) (HCC) 02/14/2023   Abscess 02/13/2023   Elevated lipase 09/27/2022   Anorexia 09/27/2022   Chronic hyponatremia 09/26/2022   Left leg swelling 05/18/2022   Acute post-operative pain 05/18/2022   Multiple pulmonary nodules 03/22/2022   UTI due to Klebsiella species 03/12/2022   Encounter for general adult medical examination with abnormal findings 10/04/2021   Hx of completed stroke 05/12/2021   Coagulation disorder (HCC) 05/12/2021   Stage 3b chronic kidney disease (HCC) 09/13/2020   History of bacterial endocarditis 10/28/2018   Other seasonal allergic rhinitis 07/08/2014   Diabetic peripheral neuropathy (HCC) 06/29/2014   Essential hypertension 08/03/2009   Acute cystitis 11/10/2008   CAD (coronary artery disease) 01/14/2008   Dizziness 10/15/2007   DM2 (diabetes mellitus, type 2) (HCC) 08/15/2007   Hyperlipidemia 06/13/2007    Depression with anxiety 06/13/2007   GERD (gastroesophageal reflux disease) 06/13/2007   Osteoarthritis 06/13/2007   PCP:  Vevelyn Gowers, NP Pharmacy:   [facility] [phone] [place], [place] - [address] AT [facility] [address] [place] [address] Phone: [phone] Fax: [phone]  [facility] [place] [facility] [address] [place] [address] Phone: [phone] Fax: [phone]     Social Drivers of Health (SDOH) Social History: SDOH Screenings   Food Insecurity: No Food Insecurity (02/13/2023)  Housing: Low Risk  (02/13/2023)  Transportation Needs: No Transportation Needs (02/13/2023)  Utilities: Not At Risk (02/13/2023)  Alcohol  Screen: Low Risk  (11/03/2021)  Depression (PHQ2-9): Low Risk  (05/18/2022)  Financial Resource Strain: Low Risk  (11/03/2021)  Physical Activity: Sufficiently Active (11/03/2021)  Social Connections: Unknown (05/11/2022)   [facility] from [facility]  Stress: No Stress Concern Present (11/03/2021)  Tobacco Use: Low Risk  (02/12/2024)   SDOH Interventions:     Readmission Risk Interventions     No data to display

## 2024-02-14 NOTE — Progress Notes (Signed)
 PROGRESS NOTE        PATIENT DETAILS Name: [name] [name] Age: 83 y.o. Sex: female Date of Birth: September 18, 1940 Admit Date: 02/12/2024 Admitting Physician Lavanda Porter, MD [id number], Cody Das, NP  Brief Summary: Patient is a 83 y.o.  female with history of CAD-multiple PCI's-last 2022, bacterial endocarditis, HTN, HLD, DM-2 who presented to the ED with weakness, confusion, headache, dizziness-upon further evaluation-she was found to have sepsis physiology secondary to complicated UTI.  Significant events: 6/11>> admit to [facility]  Significant studies: 6/11>> CT head: No acute intracranial abnormality 6/11>> CTA chest: No PE, unchanged left lung nodule. 6/11>> CT abdomen/pelvis: Left perinephric stranding-secondary to past calculi or ascending UTI.  Significant microbiology data: 6/11>> COVID/influenza/RSV PCR: Negative 6/11>> urine culture: Pending 6/11>> blood culture: No growth  Procedures: None  Consults: None  Subjective: Patient was seen and examined at bedside during morning rounds.  Patient was sitting comfortably in the recliner.  AO x 3, denies any dizziness.  Just feels little bit tiredness, no any other complaints.   Objective: Vitals: Blood pressure (!) 167/77, pulse 87, temperature 98.3 F (36.8 C), temperature source Oral, resp. rate (!) 21, height 5' (1.524 m), weight 56.2 kg, SpO2 94%.   Exam: Gen Exam:Alert awake-not in any distress-but frail/chronically sick appearing. HEENT:atraumatic, normocephalic Chest: B/L clear to auscultation anteriorly CVS:S1S2 regular Abdomen:soft non tender, non distended-mild bilateral CVA tenderness. Extremities:no edema Neurology: Non focal Skin: no rash  Pertinent Labs/Radiology:    Latest Ref Rng & Units 02/14/2024    7:21 AM 02/13/2024    7:58 AM 02/12/2024   10:55 AM  CBC  WBC 4.0 - 10.5 K/uL 6.2  8.7    Hemoglobin 12.0 - 15.0 g/dL 65.7  84.6  96.2   Hematocrit 36.0 - 46.0 % 38.5  37.3  45.0    Platelets 150 - 400 K/uL 186  197      Lab Results  Component Value Date   NA 134 (L) 02/14/2024   K 3.3 (L) 02/14/2024   CL 96 (L) 02/14/2024   CO2 26 02/14/2024      Assessment/Plan: Sepsis secondary to complicated UTI/nephritis (POA) Low-grade fever in the afternoon on 6/12 Blood cultures negative so far Urine culture growing multiple species, suggested recollection Since blood cultures negative-stop vancomycin -do not think she needs anaerobic coverage-stop Flagyl . Continue cefepime  for now, de-escalate to oral antibiotics on discharge to complete total 7-day course  Hypokalemia, and hypophosphatemia, repleted with Neutra-Phos. Monitor electrolytes and replete as needed Magnesium  1.8, Monitor    Dizziness, resolved Check orthostatics Mobilize with PT/OT CT head negative.  Acute metabolic encephalopathy Likely secondary to sepsis/UTI. Lethargic/confused on initial presentation-change from prior baseline Seems to be slowly improving-more alert-still pretty weak-continue to treat underlying UTI/sepsis physiology. 6/13 she is back to her baseline, AME resolved    Chronic HFpEF Relatively euvolemic Continue beta-blocker Echo LVEF 65 to 70%, mild pericardial effusion, no tamponade, no any other significant findings  CAD-history of multiple PCI's-last in 2022. No anginal symptoms Continue DAPT along with statin/metoprolol   HTN BP stable Continue Toprol  with holding parameters.  Reported remote history of bacterial endocarditis (approximately 4-5 years ago when she was living in [place] ) Thankfully blood cultures negative so far Follow blood [name] until final.  CKD stage IIIb Creatinine close to baseline. Trend electrolytes.  DM-2 (A1c 7.3 on 6/11) SSI while inpatient Managed with diet  at home  Recent Labs    02/14/24 0341 02/14/24 0739 02/14/24 1156  GLUCAP 166* 168* 236*     GERD PPI  Left reverse shoulder arthroplasty planned with  [facility] Recently evaluated by cardiology-felt to be acceptable risk to proceed with surgery.  Left lung nodule: Seen incidentally Unchanged 1.4 x 1.9 cm nodule-from prior studies per radiology report Repeat chest in 3 to 6 months recommended by radiology  Debility/deconditioning Secondary to acute illness/sepsis physiology PT/OT eval.   Code status:   Code Status: Limited: Do not attempt resuscitation (DNR) -DNR-LIMITED -Do Not Intubate/DNI    DVT Prophylaxis: enoxaparin  (LOVENOX ) injection 40 mg Start: 02/14/24 1000   Family Communication: Daughter in Social worker at bedside   Disposition Plan: Status is: Inpatient Remains inpatient appropriate because: Severity of illness   Planned Discharge Destination: SNF   Diet: Diet Order             Diet Heart Room service appropriate? Yes with Assist; Fluid consistency: Thin  Diet effective now                     Antimicrobial agents: Anti-infectives (From admission, onward)    Start     Dose/Rate Route Frequency Ordered Stop   02/14/24 1100  vancomycin  (VANCOCIN ) IVPB 1000 mg/200 mL premix  Status:  Discontinued        1,000 mg 200 mL/hr over 60 Minutes Intravenous Every 48 hours 02/12/24 1447 02/13/24 0647   02/13/24 1430  ceFEPIme  (MAXIPIME ) 2 g in sodium chloride  0.9 % 100 mL IVPB        2 g 200 mL/hr over 30 Minutes Intravenous Every 12 hours 02/13/24 1341     02/12/24 2300  aztreonam  (AZACTAM ) 1 g in sodium chloride  0.9 % 100 mL IVPB  Status:  Discontinued        1 g 200 mL/hr over 30 Minutes Intravenous Every 8 hours 02/12/24 1341 02/13/24 1341   02/12/24 2300  metroNIDAZOLE  (FLAGYL ) IVPB 500 mg  Status:  Discontinued        500 mg 100 mL/hr over 60 Minutes Intravenous Every 12 hours 02/12/24 1341 02/13/24 0647   02/12/24 1100  aztreonam  (AZACTAM ) 2 g in sodium chloride  0.9 % 100 mL IVPB        2 g 200 mL/hr over 30 Minutes Intravenous  Once 02/12/24 1050 02/12/24 1212   02/12/24 1100  metroNIDAZOLE  (FLAGYL )  IVPB 500 mg        500 mg 100 mL/hr over 60 Minutes Intravenous  Once 02/12/24 1050 02/12/24 1212   02/12/24 1100  vancomycin  (VANCOCIN ) IVPB 1000 mg/200 mL premix        1,000 mg 200 mL/hr over 60 Minutes Intravenous  Once 02/12/24 1050 02/12/24 1212        MEDICATIONS: Scheduled Meds:  aspirin  EC  81 mg Oral q morning   atorvastatin   80 mg Oral Daily   Chlorhexidine  Gluconate Cloth  6 each Topical Daily   clopidogrel   75 mg Oral q morning   enoxaparin  (LOVENOX ) injection  40 mg Subcutaneous Daily   ezetimibe   10 mg Oral q morning   feeding supplement  1 Container Oral TID BM   insulin  aspart  0-9 Units Subcutaneous TID WC   metoprolol  tartrate  25 mg Oral BID   multivitamin with minerals  1 tablet Oral Daily   pantoprazole   40 mg Oral Q1200   venlafaxine  XR  37.5 mg Oral q morning   Continuous Infusions:  ceFEPime  (  MAXIPIME ) IV 2 g (02/14/24 1332)   PRN Meds:.acetaminophen  **OR** acetaminophen , ondansetron  (ZOFRAN ) IV, prochlorperazine    I have personally reviewed following labs and imaging studies  LABORATORY DATA: CBC: Recent Labs  Lab 02/12/24 1039 02/12/24 1055 02/13/24 0758 02/14/24 0721  WBC 12.6*  --  8.7 6.2  NEUTROABS 10.9*  --   --   --   HGB 13.9 15.3* 12.3 12.6  HCT 44.1 45.0 37.3 38.5  MCV 98.4  --  94.2 93.7  PLT 233  --  197 186    Basic Metabolic Panel: Recent Labs  Lab 02/12/24 1039 02/12/24 1055 02/13/24 0758 02/14/24 0721 02/14/24 1042  NA 131* 132* 131* 134*  --   K 4.4 4.3 3.6 3.3*  --   CL 97* 94* 96* 96*  --   CO2 22  --  25 26  --   GLUCOSE 330* 333* 184* 160*  --   BUN 25* 28* 13 13  --   CREATININE 1.30* 1.20* 0.81 0.82  --   CALCIUM  8.4*  --  7.8* 8.2*  --   MG  --   --   --   --  1.8  PHOS  --   --   --   --  2.3*    GFR: Estimated Creatinine Clearance: 40.2 mL/min (by C-G formula based on SCr of 0.82 mg/dL).  Liver Function Tests: Recent Labs  Lab 02/12/24 1039 02/13/24 0758  AST 22 21  ALT 17 17  ALKPHOS  72 53  BILITOT 0.8 0.8  PROT 6.6 5.5*  ALBUMIN 3.0* 2.4*   No results for input(s): LIPASE, AMYLASE in the last 168 hours. No results for input(s): AMMONIA in the last 168 hours.  Coagulation Profile: Recent Labs  Lab 02/12/24 1039  INR 1.2    Cardiac Enzymes: No results for input(s): CKTOTAL, CKMB, CKMBINDEX, TROPONINI in the last 168 hours.  BNP (last 3 results) No results for input(s): PROBNP in the last 8760 hours.  Lipid Profile: No results for input(s): CHOL, HDL, LDLCALC, TRIG, CHOLHDL, LDLDIRECT in the last 72 hours.  Thyroid  Function Tests: No results for input(s): TSH, T4TOTAL, FREET4, T3FREE, THYROIDAB in the last 72 hours.  Anemia Panel: No results for input(s): VITAMINB12, FOLATE, FERRITIN, TIBC, IRON, RETICCTPCT in the last 72 hours.  Urine analysis:    Component Value Date/Time   COLORURINE YELLOW 02/12/2024 1149   APPEARANCEUR HAZY (A) 02/12/2024 1149   LABSPEC 1.015 02/12/2024 1149   PHURINE 5.0 02/12/2024 1149   GLUCOSEU 50 (A) 02/12/2024 1149   GLUCOSEU NEGATIVE 03/05/2022 0841   HGBUR NEGATIVE 02/12/2024 1149   BILIRUBINUR NEGATIVE 02/12/2024 1149   BILIRUBINUR negative 03/10/2015 1516   KETONESUR 5 (A) 02/12/2024 1149   PROTEINUR 100 (A) 02/12/2024 1149   UROBILINOGEN 1.0 03/05/2022 0841   NITRITE POSITIVE (A) 02/12/2024 1149   LEUKOCYTESUR TRACE (A) 02/12/2024 1149    Sepsis Labs: Lactic Acid, Venous    Component Value Date/Time   LATICACIDVEN 1.4 02/12/2024 2118    MICROBIOLOGY: Recent Results (from the past 240 hours)  Blood Culture (routine x 2)     Status: None (Preliminary result)   Collection Time: 02/12/24 10:38 AM   Specimen: BLOOD RIGHT FOREARM  Result Value Ref Range Status   Specimen Description BLOOD RIGHT FOREARM  Final   Special Requests   Final    BOTTLES DRAWN AEROBIC AND ANAEROBIC Blood Culture results may not be optimal due to an inadequate volume of blood received  in culture bottles  Culture   Final    NO GROWTH 2 DAYS Performed at [facility], [address] [place], [place] [address]    Report Status PENDING  Incomplete  Blood Culture (routine x 2)     Status: None (Preliminary result)   Collection Time: 02/12/24 10:45 AM   Specimen: BLOOD  Result Value Ref Range Status   Specimen Description BLOOD SITE NOT SPECIFIED  Final   Special Requests   Final    BOTTLES DRAWN AEROBIC AND ANAEROBIC Blood Culture results may not be optimal due to an inadequate volume of blood received in culture bottles   Culture   Final    NO GROWTH 2 DAYS Performed at [facility], [address], [place], [place] [address]    Report Status PENDING  Incomplete  Resp panel by RT-PCR (RSV, Flu A&B, Covid) Anterior Nasal Swab     Status: None   Collection Time: 02/12/24 11:06 AM   Specimen: Anterior Nasal Swab  Result Value Ref Range Status   SARS Coronavirus 2 by RT PCR NEGATIVE NEGATIVE Final   Influenza A by PCR NEGATIVE NEGATIVE Final   Influenza B by PCR NEGATIVE NEGATIVE Final    Comment: (NOTE) The Xpert Xpress SARS-CoV-2/FLU/RSV plus assay is intended as an aid in the diagnosis of influenza from Nasopharyngeal swab specimens and should not be used as a sole basis for treatment. Nasal washings and aspirates are unacceptable for Xpert Xpress SARS-CoV-2/FLU/RSV testing.  Fact Sheet for Patients: BloggerCourse.com  Fact Sheet for Healthcare Providers: SeriousBroker.it  This test is not yet approved or cleared by the [place]  FDA and has been authorized for detection and/or diagnosis of SARS-CoV-2 by FDA under an Emergency Use Authorization (EUA). This EUA will remain in effect (meaning this test can be used) for the duration of the COVID-19 declaration under Section 564(b)(1) of the Act, 21 U.S.C. section 360bbb-3(b)(1), unless the authorization is terminated or revoked.     Resp  Syncytial Virus by PCR NEGATIVE NEGATIVE Final    Comment: (NOTE) Fact Sheet for Patients: BloggerCourse.com  Fact Sheet for Healthcare Providers: SeriousBroker.it  This test is not yet approved or cleared by the [place]  FDA and has been authorized for detection and/or diagnosis of SARS-CoV-2 by FDA under an Emergency Use Authorization (EUA). This EUA will remain in effect (meaning this test can be used) for the duration of the COVID-19 declaration under Section 564(b)(1) of the Act, 21 U.S.C. section 360bbb-3(b)(1), unless the authorization is terminated or revoked.  Performed at [facility], [address], [place], [place] [address]   Urine Culture     Status: Abnormal   Collection Time: 02/12/24 11:49 AM   Specimen: Urine, Catheterized  Result Value Ref Range Status   Specimen Description URINE, CATHETERIZED  Final   Special Requests   Final    NONE Reflexed from [id number] Performed at [facility], [address] [place], [place] [address]    Culture MULTIPLE SPECIES PRESENT, SUGGEST RECOLLECTION (A)  Final   Report Status 02/13/2024 FINAL  Final    RADIOLOGY STUDIES/RESULTS: ECHOCARDIOGRAM COMPLETE Result Date: 02/13/2024    ECHOCARDIOGRAM REPORT   Patient Name:   YVETTA DROTAR Date of Exam: 02/13/2024 Medical Rec #:  [id number]       Height:       60.0 in Accession #:    [id number]      Weight:       124.0 lb Date of Birth:  03-08-1941       BSA:          1.523 m Patient Age:    83 years        BP:           156/70 mmHg Patient Gender: F               HR:           94 bpm. Exam Location:  Inpatient Procedure: 2D Echo, Cardiac Doppler and Color Doppler (Both Spectral and Color            Flow Doppler were utilized during procedure). Indications:    Sepsis [facility])  History:        Patient has prior history of Echocardiogram examinations, most                 recent 01/15/2022. CAD, Stroke; Risk Factors:Hypertension  and                 Diabetes.  Sonographer:    Astrid Blamer Referring Phys: Alcide Humble PATEL IMPRESSIONS  1. Left ventricular ejection fraction, by estimation, is 65 to 70%. The left ventricle has normal function. The left ventricle has no regional wall motion abnormalities. There is mild left ventricular hypertrophy. Left ventricular diastolic parameters were normal.  2. Right ventricular systolic function is normal. The right ventricular size is not well visualized. Tricuspid regurgitation signal is inadequate for assessing PA pressure.  3. Left atrial size was mildly dilated.  4. A small pericardial effusion is present. There is no evidence of cardiac tamponade.  5. The mitral valve is degenerative. Mild mitral valve regurgitation. No evidence of mitral stenosis.  6. The aortic valve was not well visualized. There is mild calcification of the aortic valve. Aortic valve regurgitation is mild. Aortic valve sclerosis/calcification is present, without any evidence of aortic stenosis.  7. The inferior vena cava is normal in size with greater than 50% respiratory variability, suggesting right atrial pressure of 3 mmHg. Comparison(s): No significant change from prior study. Conclusion(s)/Recommendation(s): Otherwise normal echocardiogram, with minor abnormalities described in the report. FINDINGS  Left Ventricle: Left ventricular ejection fraction, by estimation, is 65 to 70%. The left ventricle has normal function. The left ventricle has no regional wall motion abnormalities. The left ventricular internal cavity size was normal in size. There is  mild left ventricular hypertrophy. Left ventricular diastolic parameters were normal. Right Ventricle: The right ventricular size is not well visualized. Right vetricular wall thickness was not well visualized. Right ventricular systolic function is normal. Tricuspid regurgitation signal is inadequate for assessing PA pressure. Left Atrium: Left atrial size was mildly dilated.  Right Atrium: Right atrial size was normal in size. Pericardium: A small pericardial effusion is present. There is no evidence of cardiac tamponade. Mitral Valve: The mitral valve is degenerative in appearance. There is mild thickening of the mitral valve leaflet(s). There is mild calcification of the mitral valve leaflet(s). Mild to moderate mitral annular calcification. Mild mitral valve regurgitation. No evidence of mitral valve stenosis. Tricuspid Valve: The tricuspid valve is not well visualized. Tricuspid valve regurgitation is trivial. No evidence of tricuspid stenosis. Aortic Valve: The aortic valve was not well visualized. There is mild calcification of the aortic valve. Aortic valve regurgitation is mild. Aortic valve sclerosis/calcification is present, without any evidence of aortic stenosis. Aortic valve mean gradient measures 8.0 mmHg. Aortic valve peak gradient measures 11.7 mmHg. Aortic valve area, by VTI measures 1.33 cm. Pulmonic Valve: The  pulmonic valve was not well visualized. Pulmonic valve regurgitation is trivial. No evidence of pulmonic stenosis. Aorta: The aortic root, ascending aorta and aortic arch are all structurally normal, with no evidence of dilitation or obstruction. Venous: The inferior vena cava is normal in size with greater than 50% respiratory variability, suggesting right atrial pressure of 3 mmHg. IAS/Shunts: The atrial septum is grossly normal.  LEFT VENTRICLE PLAX 2D LVIDd:         3.50 cm   Diastology LVIDs:         2.20 cm   LV e' medial:    5.44 cm/s LV PW:         0.90 cm   LV E/e' medial:  16.9 LV IVS:        0.90 cm   LV e' lateral:   8.49 cm/s LVOT diam:     1.70 cm   LV E/e' lateral: 10.9 LV SV:         46 LV SV Index:   30 LVOT Area:     2.27 cm  RIGHT VENTRICLE RV S prime:     16.40 cm/s TAPSE (M-mode): 2.6 cm LEFT ATRIUM             Index        RIGHT ATRIUM           Index LA Vol (A2C):   40.0 ml 26.26 ml/m  RA Area:     11.20 cm LA Vol (A4C):   43.7 ml 28.69  ml/m  RA Volume:   23.30 ml  15.29 ml/m LA Biplane Vol: 44.3 ml 29.08 ml/m  AORTIC VALVE AV Area (Vmax):    1.34 cm AV Area (Vmean):   1.12 cm AV Area (VTI):     1.33 cm AV Vmax:           171.00 cm/s AV Vmean:          135.000 cm/s AV VTI:            0.344 m AV Peak Grad:      11.7 mmHg AV Mean Grad:      8.0 mmHg LVOT Vmax:         101.00 cm/s LVOT Vmean:        66.700 cm/s LVOT VTI:          0.201 m LVOT/AV VTI ratio: 0.58  AORTA Ao Root diam: 2.50 cm MITRAL VALVE MV Area (PHT): 5.54 cm    SHUNTS MV Decel Time: 137 msec    Systemic VTI:  0.20 m MV E velocity: 92.20 cm/s  Systemic Diam: 1.70 cm MV A velocity: 99.00 cm/s MV E/A ratio:  0.93 Sheryle Donning MD Electronically signed by Sheryle Donning MD Signature Date/Time: 02/13/2024/12:05:36 PM    Final    CT ABDOMEN PELVIS W CONTRAST Result Date: 02/12/2024 CLINICAL DATA:  Sepsis, dizziness EXAM: CT ABDOMEN AND PELVIS WITH CONTRAST TECHNIQUE: Multidetector CT imaging of the abdomen and pelvis was performed using the standard protocol following bolus administration of intravenous contrast. RADIATION DOSE REDUCTION: This exam was performed according to the departmental dose-optimization program which includes automated exposure control, adjustment of the mA and/or kV according to patient size and/or use of iterative reconstruction technique. CONTRAST:  65mL OMNIPAQUE  IOHEXOL  350 MG/ML SOLN COMPARISON:  None available. FINDINGS: Motion degraded study. Lower chest: For findings above the diaphragm, please see the separately dictated CT of the chest report, which was performed concurrently. Hepatobiliary: No mass.Cholecystectomy. No intrahepatic or extrahepatic biliary ductal  dilation. The portal veins are patent. Pancreas: No mass or main ductal dilation. No peripancreatic inflammation or fluid collection. Spleen: Normal size. No mass. Adrenals/Urinary Tract: No adrenal masses. No renal mass. Somewhat asymmetric enhancement of the renal  parenchyma with decreased enhancement noted in the left mid and upper portions. Left perinephric stranding. No nephrolithiasis or hydronephrosis. The urinary bladder is distended without focal abnormality. Stomach/Bowel: Small hiatal hernia. The stomach contains ingested material without focal abnormality. No small bowel wall thickening or inflammation. No small bowel obstruction.The appendix was not visualized. No right lower quadrant or pericecal inflammatory changes to suggest acute appendicitis. Subtle inflammatory stranding about the splenic flexure of the colon. Vascular/Lymphatic: No aortic aneurysm. Diffuse aortoiliac atherosclerosis. No intraabdominal or pelvic lymphadenopathy. Reproductive: Hysterectomy. No concerning adnexal mass.No free pelvic fluid. Other: No pneumoperitoneum, ascites, or mesenteric inflammation. Musculoskeletal: No acute fracture or destructive lesion. Osteopenia. Multilevel degenerative disc disease of the spine. Mild bilateral hip osteoarthritis. IMPRESSION: 1. Left perinephric stranding with somewhat asymmetric enhancement of the left renal parenchyma, which may be due to a recently passed calculus or an ascending urinary tract infection. Correlation with urinalysis recommended. 2. Subtle inflammatory stranding about the splenic flexure of the colon, which may be reactive to the left perinephric stranding. Alternatively, a focal infectious or inflammatory colitis could also have this appearance in the correct clinical context. Aortic Atherosclerosis (ICD10-I70.0). Electronically Signed   By: Rance Burrows M.D.   On: 02/12/2024 16:22   CT Angio Chest Pulmonary Embolism (PE) W or WO Contrast Result Date: 02/12/2024 CLINICAL DATA:  Pulmonary embolism (PE) suspected, high prob sepsis. EXAM: CT ANGIOGRAPHY CHEST WITH CONTRAST TECHNIQUE: Multidetector CT imaging of the chest was performed using the standard protocol during bolus administration of intravenous contrast. Multiplanar CT  image reconstructions and MIPs were obtained to evaluate the vascular anatomy. RADIATION DOSE REDUCTION: This exam was performed according to the departmental dose-optimization program which includes automated exposure control, adjustment of the mA and/or kV according to patient size and/or use of iterative reconstruction technique. CONTRAST:  65mL OMNIPAQUE  IOHEXOL  350 MG/ML SOLN COMPARISON:  June 11, 2023 FINDINGS: Motion degraded study. Pulmonary Embolism: No pulmonary embolism to the level of the proximal segmental pulmonary arteries bilaterally. The distal segmental and subsegmental branches are degraded by motion. Cardiovascular: No cardiomegaly or pericardial effusion. No aortic aneurysm. Diffuse atherosclerosis. Multi-vessel coronary atherosclerosis. Mediastinum/Nodes: No mediastinal mass. No mediastinal, hilar, or axillary lymphadenopathy. Small sliding-type hiatal hernia. Lungs/Pleura: The midline trachea and bronchi are patent. No focal airspace consolidation, pleural effusion, or pneumothorax. Unchanged 1.4 x 1.9 cm nodule in the medial left lung apex (axial 15). The solid subpleural nodule in the lateral right lower lobe (axial 82 previously), is now more ground-glass than on the prior study. Musculoskeletal: No acute fracture or destructive bone lesion. Anterior and posterior cervical fusion hardware is partially visualized. Osteopenia. Multilevel degenerative disc disease of the spine. Severe left glenohumeral joint osteoarthritis with moderate right glenohumeral joint osteoarthritis. Upper Abdomen: For findings below the diaphragm, please see the separately dictated CT of the abdomen and pelvis report, which was performed concurrently. Review of the MIP images confirms the above findings. IMPRESSION: 1. No pulmonary embolism to the level of the proximal segmental pulmonary arteries bilaterally. The distal segmental and subsegmental branches are degraded by motion. 2. No pneumonia, pulmonary edema,  or pleural effusion. 3. Unchanged 1.4 x 1.9 cm nodule in the medial left lung apex (axial 15). The solid subpleural nodule in the lateral right lower lobe is more ground-glass than  on the prior study. A repeat chest CT in 3-6 months is recommended. 4. Small sliding-type hiatal hernia. Aortic Atherosclerosis (ICD10-I70.0). Electronically Signed   By: Rance Burrows M.D.   On: 02/12/2024 16:07     LOS: 2 days   Althia Atlas, MD  [facility]    To contact the attending provider between 7A-7P or the covering provider during after hours 7P-7A, please log into the web site [email or web address] and access using universal [place] password for that web site. If you do not have the password, please call the hospital operator.  02/14/2024, 2:25 PM

## 2024-02-14 NOTE — Progress Notes (Signed)
   02/14/24 0911  Mobility  Activity Transferred from bed to chair  Level of Assistance Minimal assist, patient does 75% or more  Assistive Device Front wheel walker  Activity Response Tolerated fair  Mobility Referral Yes  Mobility visit 1 Mobility  Mobility Specialist Start Time (ACUTE ONLY) 0911  Mobility Specialist Stop Time (ACUTE ONLY) 0935  Mobility Specialist Time Calculation (min) (ACUTE ONLY) 24 min   Mobility Specialist: Progress Note  Pre-Mobility:      HR 82, SpO2 98% RA Post-Mobility:    HR 81, SpO2 95% RA  Pt agreeable to mobility session - received in bed. C/o head pressure stating it feels like fireworks in my head. Returned to chair with all needs met - call bell within reach. Family present.   [name], BS Mobility Specialist Please contact via SecureChat or  Rehab office at [phone].

## 2024-02-14 NOTE — Evaluation (Signed)
 Occupational Therapy Evaluation Patient Details Name: [name] [name] MRN: [id number] DOB: 1941-01-05 Today's Date: 02/14/2024   History of Present Illness   Patient is 83 y.o. female who presented to the ED with weakness, confusion, headache, dizziness. In ED workup revealed sepsis physiology secondary to complicated UTI. PMH significant for DMII, HTN, HLD, chronic diastolic heart failure, CAD, chronic hyponatremia, hxo f Stroke,     Clinical Impressions PTA, pt lives with granddaughter and her children, typically Modified Independent with ADLs and mobility w/ intermittent cane use. Pt presents now feeling much better than yesterday though deficits still noted in strength, standing balance and endurance. Pt able to mobilize in room w/o AD w/ CGA w/ some unsteadiness noted. Pt requires Setup for UB ADLs and Min A for LB ADLs d/t deficits. Family at bedside supportive and hopeful for continued progress. Due to deficits and fall risk, pt may benefit from continued inpatient follow up therapy, <3 hours/day at DC. However, with continued OOB activity during admission, pt has potential to progress home at DC.     If plan is discharge home, recommend the following:   A little help with walking and/or transfers;A little help with bathing/dressing/bathroom;Assistance with cooking/housework;Direct supervision/assist for medications management;Direct supervision/assist for financial management;Assist for transportation     Functional Status Assessment   Patient has had a recent decline in their functional status and demonstrates the ability to make significant improvements in function in a reasonable and predictable amount of time.     Equipment Recommendations   Other (comment) (TBD; would recommend shower chair though family may obtain at discharge)     Recommendations for Other Services         Precautions/Restrictions   Precautions Precautions: Fall Restrictions Weight Bearing  Restrictions Per Provider Order: No     Mobility Bed Mobility               General bed mobility comments: in chair on entry    Transfers Overall transfer level: Needs assistance Equipment used: None Transfers: Sit to/from Stand Sit to Stand: Min assist                  Balance Overall balance assessment: Needs assistance Sitting-balance support: No upper extremity supported, Feet supported Sitting balance-Leahy Scale: Fair     Standing balance support: No upper extremity supported, During functional activity Standing balance-Leahy Scale: Fair                             ADL either performed or assessed with clinical judgement   ADL Overall ADL's : Needs assistance/impaired Eating/Feeding: Set up   Grooming: Contact guard assist;Standing;Brushing hair;Wash/dry face Grooming Details (indicate cue type and reason): mild unsteadiness but self correction noted Upper Body Bathing: Set up;Sitting   Lower Body Bathing: Minimal assistance;Sitting/lateral leans;Sit to/from stand   Upper Body Dressing : Set up;Sitting   Lower Body Dressing: Minimal assistance;Sit to/from stand;Sitting/lateral leans   Toilet Transfer: Contact guard assist;Ambulation   Toileting- Clothing Manipulation and Hygiene: Minimal assistance;Sit to/from stand;Sitting/lateral lean       Functional mobility during ADLs: Contact guard assist       Vision Ability to See in Adequate Light: 0 Adequate Patient Visual Report: No change from baseline Vision Assessment?: No apparent visual deficits     Perception         Praxis         Pertinent Vitals/Pain Pain Assessment Pain Assessment: No/denies pain  Extremity/Trunk Assessment Upper Extremity Assessment Upper Extremity Assessment: Generalized weakness;Right hand dominant   Lower Extremity Assessment Lower Extremity Assessment: Defer to PT evaluation   Cervical / Trunk Assessment Cervical / Trunk Assessment:  Kyphotic   Communication Communication Communication: Impaired Factors Affecting Communication: Hearing impaired   Cognition Arousal: Alert Behavior During Therapy: WFL for tasks assessed/performed Cognition: Cognition impaired     Awareness: Intellectual awareness intact Memory impairment (select all impairments): Short-term memory Attention impairment (select first level of impairment): Selective attention   OT - Cognition Comments: pleasant, following directions, mild distractability noted. appearance much better than yesterday, appropriate in conversation, some minor memory deficits                 Following commands: Intact       Cueing  General Comments   Cueing Techniques: Verbal cues;Gestural cues  Family at bedside, supportive. BP post activity 136/60   Exercises     Shoulder Instructions      Home Living Family/patient expects to be discharged to:: Private residence Living Arrangements: Other relatives (granddaughter, grandchildren) Available Help at Discharge: Family Type of Home: House Home Access: Level entry     Home Layout: One level     Bathroom Shower/Tub: Chief Strategy Officer: Standard Bathroom Accessibility: Yes   Home Equipment: Grab bars - tub/shower;Rollator (4 wheels);Cane - single point          Prior Functioning/Environment Prior Level of Function : Independent/Modified Independent;History of Falls (last six months)             Mobility Comments: uses cane if feeling dizzy ADLs Comments: Mod I for ADLs, assists with some IADLs    OT Problem List: Decreased strength;Decreased activity tolerance;Impaired balance (sitting and/or standing);Decreased cognition;Decreased knowledge of use of DME or AE   OT Treatment/Interventions: Self-care/ADL training;Therapeutic exercise;Energy conservation;DME and/or AE instruction;Therapeutic activities;Patient/family education;Balance training      OT Goals(Current  goals can be found in the care plan section)   Acute Rehab OT Goals Patient Stated Goal: hoping for further progress OT Goal Formulation: With patient/family Time For Goal Achievement: 02/28/24 Potential to Achieve Goals: Good   OT Frequency:  Min 2X/week    Co-evaluation              AM-PAC OT 6 Clicks Daily Activity     Outcome Measure Help from another person eating meals?: None Help from another person taking care of personal grooming?: A Little Help from another person toileting, which includes using toliet, bedpan, or urinal?: A Little Help from another person bathing (including washing, rinsing, drying)?: A Little Help from another person to put on and taking off regular upper body clothing?: A Little Help from another person to put on and taking off regular lower body clothing?: A Little 6 Click Score: 19   End of Session Equipment Utilized During Treatment: Gait belt Nurse Communication: Other (comment) (discussed with NT)  Activity Tolerance: Patient tolerated treatment well Patient left: in chair;with call bell/phone within reach;with family/visitor present  OT Visit Diagnosis: Unsteadiness on feet (R26.81);Other abnormalities of gait and mobility (R26.89);Muscle weakness (generalized) (M62.81)                Time: 1014-1040 OT Time Calculation (min): 26 min Charges:  OT General Charges $OT Visit: 1 Visit OT Evaluation $OT Eval Low Complexity: 1 Low OT Treatments $Self Care/Home Management : 8-22 mins  Lawrence Pretty, OTR/L Acute Rehab Services Office: [phone]   Shireen Dory 02/14/2024, 12:30 PM

## 2024-02-15 ENCOUNTER — Other Ambulatory Visit (HOSPITAL_COMMUNITY): Payer: Self-pay

## 2024-02-15 DIAGNOSIS — R42 Dizziness and giddiness: Secondary | ICD-10-CM | POA: Diagnosis not present

## 2024-02-15 LAB — BASIC METABOLIC PANEL WITH GFR
Anion gap: 10 (ref 5–15)
BUN: 13 mg/dL (ref 8–23)
CO2: 30 mmol/L (ref 22–32)
Calcium: 8 mg/dL — ABNORMAL LOW (ref 8.9–10.3)
Chloride: 96 mmol/L — ABNORMAL LOW (ref 98–111)
Creatinine, Ser: 0.68 mg/dL (ref 0.44–1.00)
GFR, Estimated: >60 mL/min (ref >60)
Glucose, Bld: 173 mg/dL — ABNORMAL HIGH (ref 70–99)
Potassium: 3.3 mmol/L — ABNORMAL LOW (ref 3.5–5.1)
Sodium: 136 mmol/L (ref 135–145)

## 2024-02-15 LAB — GLUCOSE, CAPILLARY
Glucose-Capillary: 171 mg/dL — ABNORMAL HIGH (ref 70–99)
Glucose-Capillary: 257 mg/dL — ABNORMAL HIGH (ref 70–99)
Glucose-Capillary: 227 mg/dL — ABNORMAL HIGH (ref 70–99)
Glucose-Capillary: 214 mg/dL — ABNORMAL HIGH (ref 70–99)
Glucose-Capillary: 136 mg/dL — ABNORMAL HIGH (ref 70–99)

## 2024-02-15 LAB — CBC WITH DIFFERENTIAL/PLATELET
Abs Immature Granulocytes: 0.03 10*3/uL (ref 0.00–0.07)
Basophils Absolute: 0.1 10*3/uL (ref 0.0–0.1)
Basophils Relative: 1 %
Eosinophils Absolute: 0.1 10*3/uL (ref 0.0–0.5)
Eosinophils Relative: 2 %
HCT: 35.5 % — ABNORMAL LOW (ref 36.0–46.0)
Hemoglobin: 11.9 g/dL — ABNORMAL LOW (ref 12.0–15.0)
Immature Granulocytes: 1 %
Lymphocytes Relative: 16 %
Lymphs Abs: 1.1 10*3/uL (ref 0.7–4.0)
MCH: 30.7 pg (ref 26.0–34.0)
MCHC: 33.5 g/dL (ref 30.0–36.0)
MCV: 91.5 fL (ref 80.0–100.0)
Monocytes Absolute: 1.2 10*3/uL — ABNORMAL HIGH (ref 0.1–1.0)
Monocytes Relative: 18 %
Neutro Abs: 4 10*3/uL (ref 1.7–7.7)
Neutrophils Relative %: 62 %
Platelets: 226 10*3/uL (ref 150–400)
RBC: 3.88 MIL/uL (ref 3.87–5.11)
RDW: 12.9 % (ref 11.5–15.5)
WBC: 6.4 10*3/uL (ref 4.0–10.5)
nRBC: 0 % (ref 0.0–0.2)

## 2024-02-15 LAB — MAGNESIUM: Magnesium: 1.9 mg/dL (ref 1.7–2.4)

## 2024-02-15 LAB — BRAIN NATRIURETIC PEPTIDE: B Natriuretic Peptide: 86.3 pg/mL (ref 0.0–100.0)

## 2024-02-15 LAB — T4, FREE: Free T4: 0.92 ng/dL (ref 0.61–1.12)

## 2024-02-15 LAB — TSH: TSH: 2.863 u[IU]/mL (ref 0.350–4.500)

## 2024-02-15 LAB — PHOSPHORUS: Phosphorus: 3.7 mg/dL (ref 2.5–4.6)

## 2024-02-15 MED ORDER — TAMSULOSIN HCL 0.4 MG PO CAPS
0.4000 mg | ORAL_CAPSULE | Freq: Every day | ORAL | Status: DC
  Administered 2024-02-15 – 2024-02-19 (×5): 0.4 mg via ORAL
  Filled 2024-02-15 (×5): qty 1

## 2024-02-15 MED ORDER — METOPROLOL TARTRATE 50 MG PO TABS
50.0000 mg | ORAL_TABLET | Freq: Two times a day (BID) | ORAL | Status: DC
  Administered 2024-02-15: 50 mg via ORAL
  Filled 2024-02-15: qty 1

## 2024-02-15 MED ORDER — LACTATED RINGERS IV BOLUS
500.0000 mL | Freq: Once | INTRAVENOUS | Status: AC
  Administered 2024-02-15: 500 mL via INTRAVENOUS

## 2024-02-15 MED ORDER — DILTIAZEM HCL-DEXTROSE 125-5 MG/125ML-% IV SOLN (PREMIX)
5.0000 mg/h | INTRAVENOUS | Status: DC
  Administered 2024-02-15: 5 mg/h via INTRAVENOUS
  Filled 2024-02-15: qty 125

## 2024-02-15 MED ORDER — DILTIAZEM HCL 25 MG/5ML IV SOLN
10.0000 mg | Freq: Four times a day (QID) | INTRAVENOUS | Status: DC | PRN
  Administered 2024-02-15: 10 mg via INTRAVENOUS
  Filled 2024-02-15: qty 5

## 2024-02-15 MED ORDER — METOPROLOL TARTRATE 25 MG PO TABS
25.0000 mg | ORAL_TABLET | Freq: Once | ORAL | Status: AC
  Administered 2024-02-15: 25 mg via ORAL
  Filled 2024-02-15: qty 1

## 2024-02-15 MED ORDER — APIXABAN 2.5 MG PO TABS
2.5000 mg | ORAL_TABLET | Freq: Two times a day (BID) | ORAL | Status: DC
  Administered 2024-02-15 – 2024-02-19 (×8): 2.5 mg via ORAL
  Filled 2024-02-15 (×8): qty 1

## 2024-02-15 MED ORDER — POTASSIUM CHLORIDE CRYS ER 20 MEQ PO TBCR
40.0000 meq | EXTENDED_RELEASE_TABLET | Freq: Once | ORAL | Status: AC
  Administered 2024-02-15: 40 meq via ORAL
  Filled 2024-02-15: qty 2

## 2024-02-15 MED ORDER — ADENOSINE 6 MG/2ML IV SOLN: 6.0000 mg | INTRAVENOUS | Status: DC | PRN

## 2024-02-15 MED ORDER — DILTIAZEM HCL 25 MG/5ML IV SOLN
10.0000 mg | Freq: Once | INTRAVENOUS | Status: AC
  Administered 2024-02-15: 10 mg via INTRAVENOUS
  Filled 2024-02-15: qty 5

## 2024-02-15 NOTE — Progress Notes (Addendum)
 PROGRESS NOTE        PATIENT DETAILS Name: [name] [name] Age: 83 y.o. Sex: female Date of Birth: [birth year] Admit Date: 02/12/2024 Admitting Physician Lavanda Porter, MD ZOX:[id number], Cody Das, NP  Brief Summary: Patient is a 83 y.o.  female with history of CAD-multiple PCI's-last 2022, bacterial endocarditis, HTN, HLD, DM-2 who presented to the ED with weakness, confusion, headache, dizziness-upon further evaluation-she was found to have sepsis physiology secondary to complicated UTI.  Significant events: 6/11>> admit to [facility]  Significant studies: 6/11>> CT head: No acute intracranial abnormality 6/11>> CTA chest: No PE, unchanged left lung nodule. 6/11>> CT abdomen/pelvis: Left perinephric stranding-secondary to past calculi or ascending UTI. 6/12 echocardiogram - EF 60%, mild pericardial effusion, no major valvular abnormality  Significant microbiology data: 6/11>> COVID/influenza/RSV PCR: Negative 6/11>> urine culture: Pending 6/11>> blood culture: No growth  Procedures: None  Consults: Cardiology  Subjective: Patient in bed, appears comfortable, denies any headache, no fever, no chest pain or pressure but feels her heartbeat racing, no shortness of breath , no abdominal pain. No new focal weakness.  Objective: Vitals: Blood pressure (!) 155/67, pulse 75, temperature 98.2 F (36.8 C), temperature source Oral, resp. rate 14, height 5' (1.524 m), weight 56.2 kg, SpO2 97%.   Exam:  Awake Alert, No new F.N deficits, Normal affect [place].AT,PERRAL Supple Neck, No JVD,   Symmetrical Chest wall movement, Good air movement bilaterally, CTAB iRRR,No Gallops, Rubs or new Murmurs,  +ve B.Sounds, Abd Soft, No tenderness,   No Cyanosis, Clubbing or edema    Assessment/Plan:  Sepsis secondary to complicated UTI/nephritis (POA) Low-grade fever in the afternoon on 6/12 Blood cultures negative so far Urine culture growing multiple species, suggested  recollection Since blood cultures negative-stop vancomycin -do not think she needs anaerobic coverage-stop Flagyl . Continue cefepime  for now, de-escalate to oral antibiotics on discharge to complete total 7-day course  Hypokalemia, and hypophosphatemia, repleted with Neutra-Phos. Monitor electrolytes and replete as needed Magnesium  1.8, Monitor   New onset A-fib RVR, likely paroxysmal.  [place] vas 2 score of greater than 4.  Went into A-fib 8:52 AM 02/15/2024, Cardizem  push, increased beta-blocker dose, still in RVR, challenge with IV Cardizem  drip, placed on Eliquis.  Recent echo noted with preserved EF, check TSH and free T4.  Cardiology input.  Question does she still require DAPT.    Chronic HFpEF Relatively euvolemic Continue beta-blocker Echo LVEF 65 to 70%, mild pericardial effusion, no tamponade, no any other significant findings  CAD-history of multiple PCI's-last in 2022. No anginal symptoms Continue DAPT along with statin/metoprolol  dose increased on 02/15/2024, now on Cardizem  drip as well.     HTN BP stable Continue beta-blocker and now on Cardizem  drip.  Dizziness, resolved Check orthostatics Mobilize with PT/OT CT head negative.  Acute metabolic encephalopathy Likely secondary to sepsis/UTI. Lethargic/confused on initial presentation-change from prior baseline Seems to be slowly improving-more alert-still pretty weak-continue to treat underlying UTI/sepsis physiology. 6/13 she is back to her baseline, AME resolved    Reported remote history of bacterial endocarditis (approximately 4-5 years ago when she was living in [place] ) Thankfully blood cultures negative so far Follow blood [name] until final.  CKD stage IIIb Creatinine close to baseline. Trend electrolytes.  GERD PPI  Left reverse shoulder arthroplasty planned with [facility] Recently evaluated by cardiology-felt to be acceptable risk to proceed with surgery.  Left lung nodule:  Seen  incidentally Unchanged 1.4 x 1.9 cm nodule-from prior studies per radiology report Repeat chest in 3 to 6 months recommended by radiology  Debility/deconditioning Secondary to acute illness/sepsis physiology PT/OT eval.   DM-2 (A1c 7.3 on 6/11) SSI while inpatient Managed with diet at home  Recent Labs    02/14/24 1156 02/14/24 1624 02/15/24 0805  GLUCAP 236* 153* 171*        Code status:   Code Status: Limited: Do not attempt resuscitation (DNR) -DNR-LIMITED -Do Not Intubate/DNI    DVT Prophylaxis: enoxaparin  (LOVENOX ) injection 40 mg Start: 02/14/24 1000   Family Communication: grand daughter bedside on 02/15/2024.   Disposition Plan: Status is: Inpatient Remains inpatient appropriate because: Severity of illness   Planned Discharge Destination: SNF   Diet: Diet Order             Diet Heart Room service appropriate? Yes with Assist; Fluid consistency: Thin  Diet effective now                     Data Review:   Inpatient Medications  Scheduled Meds:  aspirin  EC  81 mg Oral q morning   atorvastatin   80 mg Oral Daily   Chlorhexidine  Gluconate Cloth  6 each Topical Daily   clopidogrel   75 mg Oral q morning   enoxaparin  (LOVENOX ) injection  40 mg Subcutaneous Daily   ezetimibe   10 mg Oral q morning   feeding supplement  1 Container Oral TID BM   insulin  aspart  0-9 Units Subcutaneous TID WC   metoprolol  tartrate  50 mg Oral BID   multivitamin with minerals  1 tablet Oral Daily   pantoprazole   40 mg Oral Q1200   tamsulosin  0.4 mg Oral Daily   venlafaxine  XR  37.5 mg Oral q morning   Continuous Infusions:  ceFEPime  (MAXIPIME ) IV 2 g (02/15/24 0300)   diltiazem  (CARDIZEM ) infusion     PRN Meds:.acetaminophen  **OR** acetaminophen , diltiazem , ondansetron  (ZOFRAN ) IV, prochlorperazine   DVT Prophylaxis  enoxaparin  (LOVENOX ) injection 40 mg Start: 02/14/24 1000       Recent Labs  Lab 02/12/24 1039 02/12/24 1055 02/13/24 0758  02/14/24 0721 02/15/24 0728  WBC 12.6*  --  8.7 6.2 6.4  HGB 13.9 15.3* 12.3 12.6 11.9*  HCT 44.1 45.0 37.3 38.5 35.5*  PLT 233  --  197 186 226  MCV 98.4  --  94.2 93.7 91.5  MCH 31.0  --  31.1 30.7 30.7  MCHC 31.5  --  33.0 32.7 33.5  RDW 13.2  --  12.9 12.8 12.9  LYMPHSABS 0.8  --   --   --  1.1  MONOABS 0.8  --   --   --  1.2*  EOSABS 0.0  --   --   --  0.1  BASOSABS 0.1  --   --   --  0.1    Recent Labs  Lab 02/12/24 1039 02/12/24 1055 02/12/24 1056 02/12/24 1359 02/12/24 1607 02/12/24 2118 02/13/24 0758 02/14/24 0721 02/14/24 1042 02/15/24 0728  NA 131* 132*  --   --   --   --  131* 134*  --  136  K 4.4 4.3  --   --   --   --  3.6 3.3*  --  3.3*  CL 97* 94*  --   --   --   --  96* 96*  --  96*  CO2 22  --   --   --   --   --  25 26  --  30  ANIONGAP 12  --   --   --   --   --  10 12  --  10  GLUCOSE 330* 333*  --   --   --   --  184* 160*  --  173*  BUN 25* 28*  --   --   --   --  13 13  --  13  CREATININE 1.30* 1.20*  --   --   --   --  0.81 0.82  --  0.68  AST 22  --   --   --   --   --  21  --   --   --   ALT 17  --   --   --   --   --  17  --   --   --   ALKPHOS 72  --   --   --   --   --  53  --   --   --   BILITOT 0.8  --   --   --   --   --  0.8  --   --   --   ALBUMIN 3.0*  --   --   --   --   --  2.4*  --   --   --   CRP  --   --   --   --  13.3*  --   --   --   --   --   PROCALCITON 25.08  --   --   --   --   --  12.10  --   --   --   LATICACIDVEN  --   --  2.0* 4.0*  --  1.4  --   --   --   --   INR 1.2  --   --   --   --   --   --   --   --   --   HGBA1C 7.3*  --   --   --   --   --   --   --   --   --   BNP  --   --   --   --   --  164.4*  --   --   --  86.3  MG  --   --   --   --   --   --   --   --  1.8 1.9  PHOS  --   --   --   --   --   --   --   --  2.3* 3.7  CALCIUM  8.4*  --   --   --   --   --  7.8* 8.2*  --  8.0*      Recent Labs  Lab 02/12/24 1039 02/12/24 1056 02/12/24 1359 02/12/24 1607 02/12/24 2118 02/13/24 0758  02/14/24 0721 02/14/24 1042 02/15/24 0728  CRP  --   --   --  13.3*  --   --   --   --   --   PROCALCITON 25.08  --   --   --   --  12.10  --   --   --   LATICACIDVEN  --  2.0* 4.0*  --  1.4  --   --   --   --   INR 1.2  --   --   --   --   --   --   --   --  HGBA1C 7.3*  --   --   --   --   --   --   --   --   BNP  --   --   --   --  164.4*  --   --   --  86.3  MG  --   --   --   --   --   --   --  1.8 1.9  CALCIUM  8.4*  --   --   --   --  7.8* 8.2*  --  8.0*    --------------------------------------------------------------------------------------------------------------- Lab Results  Component Value Date   CHOL 109 10/04/2021   HDL 48.00 10/04/2021   LDLCALC 39 10/04/2021   LDLDIRECT 42.0 09/12/2020   TRIG 110.0 10/04/2021   CHOLHDL 2 10/04/2021    Lab Results  Component Value Date   HGBA1C 7.3 (H) 02/12/2024   No results for input(s): TSH, T4TOTAL, FREET4, T3FREE, THYROIDAB in the last 72 hours. No results for input(s): VITAMINB12, FOLATE, FERRITIN, TIBC, IRON, RETICCTPCT in the last 72 hours. ------------------------------------------------------------------------------------------------------------------ Cardiac Enzymes No results for input(s): CKMB, TROPONINI, MYOGLOBIN in the last 168 hours.  Invalid input(s): CK  Micro Results Recent Results (from the past 240 hours)  Blood Culture (routine x 2)     Status: None (Preliminary result)   Collection Time: 02/12/24 10:38 AM   Specimen: BLOOD RIGHT FOREARM  Result Value Ref Range Status   Specimen Description BLOOD RIGHT FOREARM  Final   Special Requests   Final    BOTTLES DRAWN AEROBIC AND ANAEROBIC Blood Culture results may not be optimal due to an inadequate volume of blood received in culture bottles   Culture   Final    NO GROWTH 3 DAYS Performed at [facility], [address] [place], [place] [address]    Report Status PENDING  Incomplete  Blood Culture (routine x 2)      Status: None (Preliminary result)   Collection Time: 02/12/24 10:45 AM   Specimen: BLOOD  Result Value Ref Range Status   Specimen Description BLOOD SITE NOT SPECIFIED  Final   Special Requests   Final    BOTTLES DRAWN AEROBIC AND ANAEROBIC Blood Culture results may not be optimal due to an inadequate volume of blood received in culture bottles   Culture   Final    NO GROWTH 3 DAYS Performed at [facility], [address], [place], [place] [address]    Report Status PENDING  Incomplete  Resp panel by RT-PCR (RSV, Flu A&B, Covid) Anterior Nasal Swab     Status: None   Collection Time: 02/12/24 11:06 AM   Specimen: Anterior Nasal Swab  Result Value Ref Range Status   SARS Coronavirus 2 by RT PCR NEGATIVE NEGATIVE Final   Influenza A by PCR NEGATIVE NEGATIVE Final   Influenza B by PCR NEGATIVE NEGATIVE Final    Comment: (NOTE) The Xpert Xpress SARS-CoV-2/FLU/RSV plus assay is intended as an aid in the diagnosis of influenza from Nasopharyngeal swab specimens and should not be used as a sole basis for treatment. Nasal washings and aspirates are unacceptable for Xpert Xpress SARS-CoV-2/FLU/RSV testing.  Fact Sheet for Patients: BloggerCourse.com  Fact Sheet for Healthcare Providers: SeriousBroker.it  This test is not yet approved or cleared by the [place]  FDA and has been authorized for detection and/or diagnosis of SARS-CoV-2 by FDA under an Emergency Use Authorization (EUA). This EUA will remain in effect (meaning this test can be used)  for the duration of the COVID-19 declaration under Section 564(b)(1) of the Act, 21 U.S.C. section 360bbb-3(b)(1), unless the authorization is terminated or revoked.     Resp Syncytial Virus by PCR NEGATIVE NEGATIVE Final    Comment: (NOTE) Fact Sheet for Patients: BloggerCourse.com  Fact Sheet for Healthcare  Providers: SeriousBroker.it  This test is not yet approved or cleared by the [place]  FDA and has been authorized for detection and/or diagnosis of SARS-CoV-2 by FDA under an Emergency Use Authorization (EUA). This EUA will remain in effect (meaning this test can be used) for the duration of the COVID-19 declaration under Section 564(b)(1) of the Act, 21 U.S.C. section 360bbb-3(b)(1), unless the authorization is terminated or revoked.  Performed at [facility], [address] [place], [place] [address]   Urine Culture     Status: Abnormal   Collection Time: 02/12/24 11:49 AM   Specimen: Urine, Catheterized  Result Value Ref Range Status   Specimen Description URINE, CATHETERIZED  Final   Special Requests   Final    NONE Reflexed from [id number] Performed at [facility], [address], [place], [place] [address]    Culture MULTIPLE SPECIES PRESENT, SUGGEST RECOLLECTION (A)  Final   Report Status 02/13/2024 FINAL  Final    Radiology Reports  No results found.    Signature  -   Lynnwood Sauer M.D on 02/15/2024 at 9:18 AM   -  To page go to [email or web address]

## 2024-02-15 NOTE — Progress Notes (Signed)
 PHARMACY - ANTICOAGULATION CONSULT NOTE  Pharmacy Consult for Eliquis Indication: atrial fibrillation  Allergies  Allergen Reactions   Sulfa  Antibiotics Itching   Niacin  And Related Other (See Comments)    Must take Flush-free   Codeine Nausea Only   Macrobid [Nitrofurantoin] Nausea Only    Severe nausea   Prednisone Itching and Rash   Rocephin  [Ceftriaxone ] Itching   Sulfonamide Derivatives Itching   Tetracycline Itching and Rash    Patient Measurements: Height: 5' (152.4 cm) Weight: 56.2 kg (124 lb) IBW/kg (Calculated) : 45.5 HEPARIN  DW (KG): 56.2  Vital Signs: Temp: 98.2 F (36.8 C) (06/14 0755) Temp Source: Oral (06/14 0755) BP: 119/39 (06/14 1015) Pulse Rate: 110 (06/14 1015)  Labs: Recent Labs    02/12/24 2118 02/13/24 0758 02/14/24 0721 02/15/24 0728  HGB  --  12.3 12.6 11.9*  HCT  --  37.3 38.5 35.5*  PLT  --  197 186 226  CREATININE  --  0.81 0.82 0.68  TROPONINIHS 35*  --   --   --     Estimated Creatinine Clearance: 41.2 mL/min (by C-G formula based on SCr of 0.68 mg/dL).   Medical History: Past Medical History:  Diagnosis Date   Allergy    Anal fissure    Anxiety    CAD (coronary artery disease)    s/p inf MI 2007 - tx w/ DES to RCA // s/p POBA to [facility] and DES to dRCA in 11/18 ([facility] in [place], [place]) // Myoview  3/21: low risk // s/p DES to pRCA   Cataract    removed both eyes   Cerebrovascular disease    Carotid US  09/2017 [facility]): mild plaque (<50%) in both carotid arteries   Chronic neck pain    Chronic pain syndrome    Diabetes Mellitus, Type 2    Diverticular disease    DJD (degenerative joint disease)    Echocardiogram 10/2018    Echo 10/2018: EF 55-60, normal RVSF, mod MAC, mod TR, severe AoV calcification and sclerosis with nodular calcium /mobile area of calcium  in the LVOT (small veg vs Lambl's excrescence  - consider TEE), mild AI, mild AS (mean 11).    Echocardiogram 04/2019    Echocardiogram 04/2019: EF 60-65, basal  septal hypertrophy, grade 2 diastolic dysfunction, normal wall motion, normal RV SF, mild LAE, mod MAC, trivial MR, mod sclerosis of the aortic valve with mod aortic annular calcification, thin mobile filamentous structure on ventricular side of AV likely representing Lambl's excrescence, mild AI, mild TR   Frequent UTI's    Gastroesophageal reflux disease    H/O bacterial endocarditis    Rx w IV Abxs in 2020 ([facility] in [place], [place]) // F/u echo with AoV Lambl's excrescence   H/O hiatal hernia    HTN (hypertension)    Hx of fall 10/2020   Hx of MI 2007   Hx of Stroke    IBS (irritable bowel syndrome)    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 (Cervical Fusion)   Premature ventricular contractions    Tubular adenoma of colon    Ulcer    [facility]   Urinary incontinence  Medications:  Scheduled:   apixaban  2.5 mg Oral BID   aspirin  EC  81 mg Oral q morning   atorvastatin   80 mg Oral Daily   Chlorhexidine  Gluconate Cloth  6 each Topical Daily   clopidogrel   75 mg Oral q morning   ezetimibe   10 mg Oral q morning   feeding supplement  1 Container Oral TID BM   insulin  aspart  0-9 Units Subcutaneous TID WC   metoprolol  tartrate  50 mg Oral BID   multivitamin with minerals  1 tablet Oral Daily   pantoprazole   40 mg Oral Q1200   tamsulosin  0.4 mg Oral Daily   venlafaxine  XR  37.5 mg Oral q morning    Assessment: 83YOF with history of CAD & multiple PCIs who presented with sepsis 2/2 suspected UTI. New onset Afib RVR this AM. Pharmacy has been consulted to start Eliquis. Not on anticoagulation prior to admission.  Hgb 11.9, plt 226 - stable.  Copay check with [facility] pharmacy is  $302 - $255 deductible with $47 copay. Spoke with patient who stated she could not afford this initial cost. Screened patient for [employer] Extra Help and appears patient is eligible. Gave information to patient and family on how to apply. Plan to use first 30-day free copay card on discharge to provide patient with supply while working on LIS application. Patient follows with [facility] cardiology outpatient, so informed patient of ability to likely obtain samples from the office while awaiting to hear back from LIS. Patient and family agreeable with plan  Goal of Therapy:  Monitor platelets by anticoagulation protocol: Yes   Plan:  Start Eliquis 2.5 mg BID this evening Monitor CBC Pharmacy will continue to monitor peripherally  [name] [name] 02/15/2024,10:46 AM

## 2024-02-15 NOTE — Progress Notes (Signed)
 Mobility Specialist Progress Note;   02/15/24 1106  Mobility  Activity Refused mobility  Mobility Specialist Start Time (ACUTE ONLY) 1106   Deferring mobility at this time d/t pts resting HR in 140s-150s. Pt left in bed with all needs met. Will f/u tomorrow for mobility.   Janit Meline Mobility Specialist Please contact via SecureChat or [employer] [phone]

## 2024-02-15 NOTE — Progress Notes (Signed)
 Patient assisted to chair. HR increased to 190s. BP 152/114. Pt report that her heart was racing. Patient assisted back to bed and RR called. Dr Zelda Hickman informed and at bedside. Cardizem  10 mg IV, Metoprolol  25mg  po and Potassium 40mEq po was given. HR remained elevated. Cardizem  infused started @ 5mg  and then increased to 7.5mg . BP dropped to 98/52 Dr Zelda Hickman informed ordered for bolus of LR 500mls. Given.

## 2024-02-15 NOTE — Consult Note (Addendum)
 Cardiology Consultation   Patient ID: [name] [name] MRN: [id number]; DOB: 05/21/1941  Admit date: 02/12/2024 Date of Consult: 02/15/2024  PCP:  Vevelyn Gowers, NP   [facility] Providers Cardiologist:  Arnoldo Lapping, MD  Cardiology APP:  Gabino Joe, PA-C       Patient Profile: [name] [name] is a 83 y.o. female with a hx of CAD-multiple PCI's-last 2022, bacterial endocarditis, HTN, HLD, DM-2, who is being seen 02/15/2024 for the evaluation of Afib, RVR at the request of Dr Zelda Hickman.  History of Present Illness: Ms. Ranes has been having palpitations for several months. A Zio monitor 12/2023 did not show Afib >> Toprol  increased. Seen 05/06 for preop eval for shoulder surgery >> ok to proceed.   She was admitted 06/11 with sepsis 2nd complicated UTI, COVID negative. Initial ECG was sinus tach w/ frequent PACs, HR 114. Blood cultures negative, urine culture mult spp, K+ and Phos supplemented.  Today, she had palpitations and ECG showed Afib, RVR, Cards asked to see.   Ms Cumber went from sinus rhythm/sinus tach with frequent PACs and 2-3 beat runs of NSVT to rapid atrial fibrillation with a heart rate greater than 160 between 8 and 9:00 this morning.  There is a clear difference in her telemetry and a dramatic increase in her heart rate.  She was immediately aware of this, it felt different from the palpitations she normally has.  Her heart rate was faster and it did not stop after a few seconds like it normally does.  She did not have chest pain with this, but she was weak and was glad to have help with getting back to bed.  This has never happened to her before.  She was starting to feel better from the infection, and had gotten up to the chair this morning.  Nothing was different or worse.  All of note, her heart rate was in the 120s in the ER when she first arrived, but her initial ECG did not show atrial fibs.  This was felt secondary to her temperature of 102.3 and  her heart rate improved when she became afebrile.  Since then, her heart rate has been generally less than 100 except when she was running a fever.  She has not been febrile in over 24 hours.  Currently, she only occasionally feels a palpitation or two and she is still in A-fib with a heart rate in the 110s-120s.    Past Medical History:  Diagnosis Date   Allergy    Anal fissure    Anxiety    CAD (coronary artery disease)    s/p inf MI 2007 - tx w/ DES to RCA // s/p POBA to [facility] and DES to dRCA in 11/18 ([facility] in [place], [place]) // Myoview  3/21: low risk // s/p DES to pRCA   Cataract    removed both eyes   Cerebrovascular disease    Carotid US  09/2017 [facility]): mild plaque (<50%) in both carotid arteries   Chronic neck pain    Chronic pain syndrome    Diabetes Mellitus, Type 2    Diverticular disease    DJD (degenerative joint disease)    Echocardiogram 10/2018    Echo 10/2018: EF 55-60, normal RVSF, mod MAC, mod TR, severe AoV calcification and sclerosis with nodular calcium /mobile area of calcium  in the LVOT (small veg vs Lambl's excrescence  - consider TEE), mild AI, mild AS (mean 11).    Echocardiogram 04/2019    Echocardiogram 04/2019: EF  60-65, basal septal hypertrophy, grade 2 diastolic dysfunction, normal wall motion, normal RV SF, mild LAE, mod MAC, trivial MR, mod sclerosis of the aortic valve with mod aortic annular calcification, thin mobile filamentous structure on ventricular side of AV likely representing Lambl's excrescence, mild AI, mild TR   Frequent UTI's    Gastroesophageal reflux disease    H/O bacterial endocarditis    Rx w IV Abxs in 2020 ([facility] in [place], [place]) // F/u echo with AoV Lambl's excrescence   H/O hiatal hernia    HTN (hypertension)    Hx of fall 10/2020   Hx of MI 2007   Hx of Stroke    IBS (irritable bowel syndrome)    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 (Cervical Fusion)   Premature ventricular contractions    Tubular adenoma of colon    Ulcer    [facility] [place] [facility]   Urinary incontinence     Past Surgical History:  Procedure Laterality Date   APPENDECTOMY  1955   BLADDER REPAIR  2007   BRONCHIAL BIOPSY  05/17/2022   Procedure: BRONCHIAL BIOPSIES;  Surgeon: Gloriajean Large, MD;  Location: [facility] ENDOSCOPY;  Service: Pulmonary;;   BRONCHIAL BRUSHINGS  05/17/2022   Procedure: BRONCHIAL BRUSHINGS;  Surgeon: Gloriajean Large, MD;  Location: [facility] ENDOSCOPY;  Service: Pulmonary;;   BRONCHIAL NEEDLE ASPIRATION BIOPSY  05/17/2022   Procedure: BRONCHIAL NEEDLE ASPIRATION BIOPSIES;  Surgeon: Gloriajean Large, MD;  Location: [facility] ENDOSCOPY;  Service: Pulmonary;;   CARDIAC CATHETERIZATION  2007   Stents   CERVICAL SPINE SURGERY  07/2005   Dr Paulla Bossier   CHOLECYSTECTOMY  2007   COLONOSCOPY     CORONARY PRESSURE/FFR WITH 3D MAPPING N/A 06/28/2021   Procedure: Coronary Pressure Wire/FFR w/3D Mapping;  Surgeon: Odie Benne, MD;  Location: [facility] INVASIVE CV LAB;  Service: Cardiovascular;  Laterality: N/A;   CORONARY STENT INTERVENTION N/A 03/09/2020   Procedure: CORONARY STENT INTERVENTION;  Surgeon: Odie Benne, MD;  Location: [facility] INVASIVE CV LAB;  Service: Cardiovascular;  Laterality: N/A;   CORONARY STENT INTERVENTION N/A 06/28/2021   Procedure: CORONARY STENT INTERVENTION;  Surgeon: Odie Benne, MD;  Location: [facility] INVASIVE CV LAB;  Service: Cardiovascular;  Laterality: N/A;   DENTAL SURGERY  05/2013   replaced an inplant   EYE SURGERY  2011   Bilteral   FIDUCIAL MARKER PLACEMENT  05/17/2022   Procedure: FIDUCIAL MARKER PLACEMENT;  Surgeon: Gloriajean Large, MD;  Location: [facility] ENDOSCOPY;  Service: Pulmonary;;   LUL   I & D KNEE WITH POLY EXCHANGE  09/25/2011   Procedure: IRRIGATION AND DEBRIDEMENT KNEE WITH POLY EXCHANGE;  Surgeon: Neville Barbone, MD;  Location: [facility] OR;  Service: Orthopedics;  Laterality: Left;   INCISION AND DRAINAGE ABSCESS / HEMATOMA OF BURSA / KNEE / THIGH  09/2011   JOINT REPLACEMENT  2012   left   LEFT HEART CATH AND CORONARY ANGIOGRAPHY N/A 03/09/2020   Procedure: LEFT HEART CATH AND CORONARY ANGIOGRAPHY;  Surgeon: Odie Benne, MD;  Location: [facility] INVASIVE CV LAB;  Service: Cardiovascular;  Laterality: N/A;   LEFT HEART CATH AND CORONARY  ANGIOGRAPHY N/A 06/28/2021   Procedure: LEFT HEART CATH AND CORONARY ANGIOGRAPHY;  Surgeon: Odie Benne, MD;  Location: [facility] INVASIVE CV LAB;  Service: Cardiovascular;  Laterality: N/A;   left Total Knee Replacement  11/2010   Dr Abigail Abler   NECK SURGERY     has had 3 surgeries   POLYPECTOMY     POSTERIOR CERVICAL FUSION/FORAMINOTOMY  04/08/2012   Procedure: POSTERIOR CERVICAL FUSION/FORAMINOTOMY LEVEL 2;  Surgeon: Shary Deems, MD;  Location: [facility] NEURO ORS;  Service: Neurosurgery;  Laterality: Left;  Left Cervical six-seven Foraminotomy, bilateral cervical seven-thoracic one foraminotomy, cervical six-seven, cervical seven-thoracic one fusion with posterior instrumentation   TEMPOROMANDIBULAR JOINT SURGERY     thumb surgery     TONSILLECTOMY     TONSILLECTOMY  1950   TOTAL ABDOMINAL HYSTERECTOMY     VIDEO BRONCHOSCOPY WITH RADIAL ENDOBRONCHIAL ULTRASOUND  05/17/2022   Procedure: VIDEO BRONCHOSCOPY WITH RADIAL ENDOBRONCHIAL ULTRASOUND;  Surgeon: Gloriajean Large, MD;  Location: [facility] ENDOSCOPY;  Service: Pulmonary;;     Home Medications:  Prior to Admission medications   Medication Sig Start Date End Date Taking? Authorizing Provider  acetaminophen  (TYLENOL ) 325 MG tablet Take 650 mg by mouth every 6 (six) hours as needed for moderate pain.   Yes [provider]  amLODipine  (NORVASC ) 5 MG tablet Take 1 tablet (5 mg  total) by mouth daily. 11/07/21  Yes Adelia Homestead, MD  Ascorbic Acid  (VITAMIN C ) 500 MG CAPS Take 500 mg by mouth in the morning and at bedtime.   Yes [provider]  aspirin  EC 81 MG tablet Take 81 mg by mouth every morning.    Yes [provider]  atorvastatin  (LIPITOR ) 80 MG tablet TAKE ONE TABLET BY MOUTH EVERYDAY AT BEDTIME 08/06/22  Yes Arcadio Knuckles, MD  Cinnamon 500 MG capsule Take 2 capsules twice a day by oral route as directed.   Yes [provider]  clopidogrel  (PLAVIX ) 75 MG tablet TAKE ONE CAPSULE BY MOUTH EVERY MORNING 08/06/22  Yes Arcadio Knuckles, MD  dicyclomine  (BENTYL ) 10 MG capsule Take 10 mg by mouth 3 (three) times daily.   Yes [provider]  ezetimibe  (ZETIA ) 10 MG tablet TAKE ONE TABLET BY MOUTH EVERY MORNING 08/06/22  Yes Arcadio Knuckles, MD  famotidine  (PEPCID ) 40 MG tablet Take 40 mg by mouth 2 (two) times daily. 12/06/22  Yes [provider]  gabapentin  (NEURONTIN ) 300 MG capsule Take 300 mg by mouth 3 (three) times daily.   Yes [provider]  losartan  (COZAAR ) 25 MG tablet Take 1 tablet (25 mg total) by mouth daily. 02/12/22  Yes Clearnce Curia, NP  metoprolol  succinate (TOPROL -XL) 50 MG 24 hr tablet Take 1 tablet (50 mg total) by mouth 2 (two) times daily. Take with or immediately following a meal. 12/30/23  Yes Flo Hummingbird, PA-C  montelukast (SINGULAIR) 10 MG tablet Take 10 mg by mouth at bedtime. 02/07/23  Yes [provider]  nitroGLYCERIN  (NITROSTAT ) 0.4 MG SL tablet DISSOLVE 1 TABLET UNDER THE TONGUE EVERY 5 MINUTES AS NEEDED FOR CHEST PAIN. DO NOT EXCEED A TOTAL OF 3 DOSES IN 15 MINUTES. 11/29/21  Yes Arcadio Knuckles, MD  Omega-3 Fatty Acids (FISH OIL) 1000 MG CAPS Take 1,000 mg by mouth in the morning and at bedtime.   Yes [provider]  ondansetron  (ZOFRAN ) 4 MG tablet Take 4 mg by mouth every 8 (eight) hours as needed for vomiting or nausea. 02/07/23  Yes [provider]   pantoprazole  (PROTONIX ) 40 MG tablet Take 40 mg by mouth 2 (two) times daily.   Yes [provider]  Psyllium (DAILY FIBER PO) Take 1 capsule by mouth daily.   Yes [provider]  venlafaxine  XR (EFFEXOR -XR) 37.5 MG 24 hr capsule Take 37.5 mg by mouth every morning. 02/07/23  Yes [provider]    Scheduled Meds:  apixaban  2.5 mg Oral BID   aspirin  EC  81 mg Oral q morning   atorvastatin   80 mg Oral Daily   Chlorhexidine  Gluconate Cloth  6 each Topical Daily   clopidogrel   75 mg Oral q morning   ezetimibe   10 mg Oral q morning   feeding supplement  1 Container Oral TID BM   insulin  aspart  0-9 Units Subcutaneous TID WC   metoprolol  tartrate  50 mg Oral BID   multivitamin with minerals  1 tablet Oral Daily   pantoprazole   40 mg Oral Q1200   tamsulosin  0.4 mg Oral Daily   venlafaxine  XR  37.5 mg Oral q morning   Continuous Infusions:  ceFEPime  (MAXIPIME ) IV 2 g (02/15/24 0300)   diltiazem  (CARDIZEM ) infusion 7.5 mg/hr (02/15/24 0951)   PRN Meds: acetaminophen  **OR** acetaminophen , diltiazem , ondansetron  (ZOFRAN ) IV, prochlorperazine   Allergies:    Allergies  Allergen Reactions   Sulfa  Antibiotics Itching   Niacin  And Related Other (See Comments)    Must take Flush-free   Codeine Nausea Only   Macrobid [Nitrofurantoin] Nausea Only    Severe nausea   Prednisone Itching and Rash   Rocephin  [Ceftriaxone ] Itching   Sulfonamide Derivatives Itching   Tetracycline Itching and Rash    Social History:   Social History   Socioeconomic History   Marital status: Widowed    Spouse name: dolly Sutley   Number of children: 6   Years of education: Not on file   Highest education level: Not on file  Occupational History    Employer: RETIRED  Tobacco Use   Smoking status: Never    Passive exposure: Never   Smokeless tobacco: Never  Vaping Use   Vaping status: Never Used  Substance and Sexual Activity   Alcohol  use: No   Drug use: No   Sexual  activity: Yes    Partners: Male  Other Topics Concern   Not on file  Social History Narrative   Pt never knew her father   Daily caffeine use   Social Drivers of Corporate investment banker Strain: Low Risk  (11/03/2021)   Overall Financial Resource Strain (CARDIA)    Difficulty of Paying Living Expenses: Not hard at all  Food Insecurity: No Food Insecurity (02/13/2023)   Hunger Vital Sign    Worried About Running Out of Food in the Last Year: Never true    Ran Out of Food in the Last Year: Never true  Transportation Needs: No Transportation Needs (02/13/2023)   PRAPARE - Administrator, Civil Service (Medical): No    Lack of Transportation (Non-Medical): No  Physical Activity: Sufficiently Active (11/03/2021)   Exercise Vital Sign    Days of Exercise per Week: 5 days    Minutes of Exercise per Session: 30 min  Stress: No Stress Concern Present (11/03/2021)   [employer] of Occupational Health - Occupational Stress Questionnaire    Feeling of Stress : Not at all  Social Connections: Unknown (05/11/2022)   Received from [facility]   Social Network    Social Network:  Not on file  Intimate Partner Violence: Not At Risk (02/13/2023)   Humiliation, Afraid, Rape, and Kick questionnaire    Fear of Current or Ex-Partner: No    Emotionally Abused: No    Physically Abused: No    Sexually Abused: No    Family History:  Family History  Problem Relation Age of Onset   Heart disease Mother    Pancreatic cancer Mother    Colon polyps Sister    Pancreatitis Sister    Lung cancer Brother    Cancer - Other Brother        vocal cord cancer   Coronary artery disease Other        sibling   Coronary artery disease Other        sibling   Colon cancer Neg Hx    Esophageal cancer Neg Hx    Rectal cancer Neg Hx    Stomach cancer Neg Hx      ROS:  Please see the history of present illness.  All other ROS reviewed and negative.     Physical Exam/Data: Vitals:   02/15/24  0957 02/15/24 1000 02/15/24 1015 02/15/24 1048  BP: 136/63 122/69 (!) 119/39 (!) 98/52  Pulse: 87 99 (!) 110 92  Resp: (!) 23 16 14 14   Temp:      TempSrc:      SpO2: 93% 94% 91% 93%  Weight:      Height:        Intake/Output Summary (Last 24 hours) at 02/15/2024 1149 Last data filed at 02/15/2024 0635 Gross per 24 hour  Intake --  Output 1500 ml  Net -1500 ml      02/12/2024   10:46 AM 01/29/2024   10:35 AM 01/07/2024    1:21 PM  Last 3 Weights  Weight (lbs) 124 lb 122 lb 124 lb 6.4 oz  Weight (kg) 56.246 kg 55.339 kg 56.427 kg     Body mass index is 24.22 kg/m.  General:  Well nourished, well developed, elderly female in no acute distress HEENT: normal for age Neck: no JVD Vascular: No carotid bruits; Distal pulses 2+ bilaterally Cardiac:  normal S1, S2; rapid and irregular rate and rhythm; no murmur  Lungs: Some scattered rales bilaterally, no wheezing, rhonchi  Abd: soft, nontender, no hepatomegaly  Ext: no edema Musculoskeletal:  No deformities, BUE and BLE strength normal and equal Skin: warm and dry  Neuro:  CNs 2-12 intact, no focal abnormalities noted Psych:  Normal affect   EKG:  The EKG was personally reviewed and demonstrates:   06/14 ECG is Afib, RVR, HR 167 06/11 ECG is SR, frequent PACs, HR 114 Telemetry:  Telemetry was personally reviewed and demonstrates: Sinus rhythm/sinus tach with frequent PACs and some short runs of?  Atrial tach, 2-3 beats NSVT, changing to rapid atrial fibrillation with a heart rate greater than 150 at 8:52 this a.m.  Relevant CV Studies:  ECHO: 02/13/2024  1. Left ventricular ejection fraction, by estimation, is 65 to 70%. The  left ventricle has normal function. The left ventricle has no regional  wall motion abnormalities. There is mild left ventricular hypertrophy.  Left ventricular diastolic parameters were normal.   2. Right ventricular systolic function is normal. The right ventricular  size is not well visualized.  Tricuspid regurgitation signal is inadequate for assessing PA pressure.   3. Left atrial size was mildly dilated.   4. A small pericardial effusion is present. There is no evidence of  cardiac tamponade.  5. The mitral valve is degenerative. Mild mitral valve regurgitation. No evidence of mitral stenosis.   6. The aortic valve was not well visualized. There is mild calcification of the aortic valve. Aortic valve regurgitation is mild. Aortic valve sclerosis/calcification is present, without any evidence of aortic stenosis.   7. The inferior vena cava is normal in size with greater than 50%  respiratory variability, suggesting right atrial pressure of 3 mmHg.   Monitor: 12/30/2023 Patch Wear Time:  12 days and 21 hours (2025-04-01T11:15:20-0400 to 2025-04-14T08:58:14-0400)   Patient had a min HR of 54 bpm, max HR of 203 bpm, and avg HR of 68 bpm. Predominant underlying rhythm was Sinus Rhythm. 54 Supraventricular Tachycardia runs occurred, the run with the fastest interval lasting 5 beats with a max rate of 203 bpm, the  longest lasting 17.9 secs with an avg rate of 122 bpm. Isolated SVEs were frequent (5.2%, 61736), SVE Couplets were rare (<1.0%, 1698), and SVE Triplets were rare (<1.0%, 280). Isolated VEs were rare (<1.0%), VE Couplets were rare (<1.0%), and no VE  Triplets were present.    Summary: The basic rhythm is normal sinus with an average heart rate of 68 bpm.  There are occasional, short supraventricular runs.  Frequent PACs are present at a frequency of about 5%.  There are rare ventricular ectopic beats.  There are no sustained arrhythmias.  There is no atrial fibrillation or flutter.  There is no high-grade AV block.  MYOVIEW : 11/15/2023   The study is normal. The study is low risk.   No ST deviation was noted.   LV perfusion is normal. There is no evidence of ischemia. There is no evidence of infarction.   Left ventricular function is normal. Nuclear stress EF: 71%. The left  ventricular ejection fraction is hyperdynamic (>65%). End diastolic cavity size is normal. End systolic cavity size is normal.   Prior study available for comparison from 11/12/2019.  CARDIAC CATH: 06/28/2021   Mid LAD lesion is 30% stenosed.   Prox Cx to Mid Cx lesion is 30% stenosed.   Mid RCA lesion is 10% stenosed.   Prox LAD to Mid LAD lesion is 70% stenosed.   A drug-eluting stent was successfully placed using a STENT ONYX FRONTIER 3.0X18.   Post intervention, there is a 0% residual stenosis.   Patent mid RCA stents   Severe proximal LAD stenosis that angiographically appears moderate to severe. RFR assessment with pressure wire of 0.88.  Successful PTCA/DES x 1 proximal LAD (RFR guided) Mild to moderate stenosis in the non-dominant mid Circumflex Large dominant RCA with patent mid stents without restenosis Normal LVEDP  Recommendations: Continue DAPT with ASA/Plavix . Continue beta blocker and statin.  Diagnostic Dominance: Right  Intervention    Laboratory Data: High Sensitivity Troponin:   Recent Labs  Lab 02/12/24 2118  TROPONINIHS 35*     Chemistry Recent Labs  Lab 02/13/24 0758 02/14/24 0721 02/14/24 1042 02/15/24 0728  NA 131* 134*  --  136  K 3.6 3.3*  --  3.3*  CL 96* 96*  --  96*  CO2 25 26  --  30  GLUCOSE 184* 160*  --  173*  BUN 13 13  --  13  CREATININE 0.81 0.82  --  0.68  CALCIUM  7.8* 8.2*  --  8.0*  MG  --   --  1.8 1.9  GFRNONAA >60 >60  --  >60  ANIONGAP 10 12  --  10    Recent Labs  Lab 02/12/24 1039 02/13/24 0758  PROT 6.6 5.5*  ALBUMIN 3.0* 2.4*  AST 22 21  ALT 17 17  ALKPHOS 72 53  BILITOT 0.8 0.8   Lipids No results for input(s): CHOL, TRIG, HDL, LABVLDL, LDLCALC, CHOLHDL in the last 168 hours.  Hematology Recent Labs  Lab 02/13/24 0758 02/14/24 0721 02/15/24 0728  WBC 8.7 6.2 6.4  RBC 3.96 4.11 3.88  HGB 12.3 12.6 11.9*  HCT 37.3 38.5 35.5*  MCV 94.2 93.7 91.5  MCH 31.1 30.7 30.7  MCHC 33.0 32.7 33.5   RDW 12.9 12.8 12.9  PLT 197 186 226   Thyroid  No results for input(s): TSH, FREET4 in the last 168 hours.  BNP Recent Labs  Lab 02/12/24 2118 02/15/24 0728  BNP 164.4* 86.3    DDimer No results for input(s): DDIMER in the last 168 hours.  Radiology/Studies:  ECHOCARDIOGRAM COMPLETE Result Date: 02/13/2024    ECHOCARDIOGRAM REPORT   Patient Name:   [name] [name] Date of Exam: 02/13/2024 Medical Rec #:  [id number]       Height:       60.0 in Accession #:    [id number]      Weight:       124.0 lb Date of Birth:  December 11, 1940       BSA:          1.523 m Patient Age:    83 years        BP:           156/70 mmHg Patient Gender: F               HR:           94 bpm. Exam Location:  Inpatient Procedure: 2D Echo, Cardiac Doppler and Color Doppler (Both Spectral and Color            Flow Doppler were utilized during procedure). Indications:    Sepsis [facility])  History:        Patient has prior history of Echocardiogram examinations, most                 recent 01/15/2022. CAD, Stroke; Risk Factors:Hypertension and                 Diabetes.  Sonographer:    Astrid Blamer Referring Phys: Alcide Humble PATEL IMPRESSIONS  1. Left ventricular ejection fraction, by estimation, is 65 to 70%. The left ventricle has normal function. The left ventricle has no regional wall motion abnormalities. There is mild left ventricular hypertrophy. Left ventricular diastolic parameters were normal.  2. Right ventricular systolic function is normal. The right ventricular size is not well visualized. Tricuspid regurgitation signal is inadequate for assessing PA pressure.  3. Left atrial size was mildly dilated.  4. A small pericardial effusion is present. There is no evidence of cardiac tamponade.  5. The mitral valve is degenerative. Mild mitral valve regurgitation. No evidence of mitral stenosis.  6. The aortic valve was not well visualized. There is mild calcification of the aortic valve. Aortic valve regurgitation is mild. Aortic valve  sclerosis/calcification is present, without any evidence of aortic stenosis.  7. The inferior vena cava is normal in size with greater than 50% respiratory variability, suggesting right atrial pressure of 3 mmHg. Comparison(s): No significant change from prior study. Conclusion(s)/Recommendation(s): Otherwise normal echocardiogram, with minor abnormalities described in the report. FINDINGS  Left Ventricle: Left ventricular ejection fraction, by estimation, is 65 to 70%. The left ventricle has normal function.  The left ventricle has no regional wall motion abnormalities. The left ventricular internal cavity size was normal in size. There is  mild left ventricular hypertrophy. Left ventricular diastolic parameters were normal. Right Ventricle: The right ventricular size is not well visualized. Right vetricular wall thickness was not well visualized. Right ventricular systolic function is normal. Tricuspid regurgitation signal is inadequate for assessing PA pressure. Left Atrium: Left atrial size was mildly dilated. Right Atrium: Right atrial size was normal in size. Pericardium: A small pericardial effusion is present. There is no evidence of cardiac tamponade. Mitral Valve: The mitral valve is degenerative in appearance. There is mild thickening of the mitral valve leaflet(s). There is mild calcification of the mitral valve leaflet(s). Mild to moderate mitral annular calcification. Mild mitral valve regurgitation. No evidence of mitral valve stenosis. Tricuspid Valve: The tricuspid valve is not well visualized. Tricuspid valve regurgitation is trivial. No evidence of tricuspid stenosis. Aortic Valve: The aortic valve was not well visualized. There is mild calcification of the aortic valve. Aortic valve regurgitation is mild. Aortic valve sclerosis/calcification is present, without any evidence of aortic stenosis. Aortic valve mean gradient measures 8.0 mmHg. Aortic valve peak gradient measures 11.7 mmHg. Aortic valve  area, by VTI measures 1.33 cm. Pulmonic Valve: The pulmonic valve was not well visualized. Pulmonic valve regurgitation is trivial. No evidence of pulmonic stenosis. Aorta: The aortic root, ascending aorta and aortic arch are all structurally normal, with no evidence of dilitation or obstruction. Venous: The inferior vena cava is normal in size with greater than 50% respiratory variability, suggesting right atrial pressure of 3 mmHg. IAS/Shunts: The atrial septum is grossly normal.  LEFT VENTRICLE PLAX 2D LVIDd:         3.50 cm   Diastology LVIDs:         2.20 cm   LV e' medial:    5.44 cm/s LV PW:         0.90 cm   LV E/e' medial:  16.9 LV IVS:        0.90 cm   LV e' lateral:   8.49 cm/s LVOT diam:     1.70 cm   LV E/e' lateral: 10.9 LV SV:         46 LV SV Index:   30 LVOT Area:     2.27 cm  RIGHT VENTRICLE RV S prime:     16.40 cm/s TAPSE (M-mode): 2.6 cm LEFT ATRIUM             Index        RIGHT [facility]           Index LA Vol (A2C):   40.0 ml 26.26 ml/m  RA Area:     11.20 cm LA Vol (A4C):   43.7 ml 28.69 ml/m  RA Volume:   23.30 ml  15.29 ml/m LA Biplane Vol: 44.3 ml 29.08 ml/m  AORTIC VALVE AV Area (Vmax):    1.34 cm AV Area (Vmean):   1.12 cm AV Area (VTI):     1.33 cm AV Vmax:           171.00 cm/s AV Vmean:          135.000 cm/s AV VTI:            0.344 m AV Peak Grad:      11.7 mmHg AV Mean Grad:      8.0 mmHg LVOT Vmax:         101.00 cm/s LVOT Vmean:  66.700 cm/s LVOT VTI:          0.201 m LVOT/AV VTI ratio: 0.58  AORTA Ao Root diam: 2.50 cm MITRAL VALVE MV Area (PHT): 5.54 cm    SHUNTS MV Decel Time: 137 msec    Systemic VTI:  0.20 m MV E velocity: 92.20 cm/s  Systemic Diam: 1.70 cm MV A velocity: 99.00 cm/s MV E/A ratio:  0.93 Sheryle Donning MD Electronically signed by Sheryle Donning MD Signature Date/Time: 02/13/2024/12:05:36 PM    Final    CT ABDOMEN PELVIS W CONTRAST Result Date: 02/12/2024 CLINICAL DATA:  Sepsis, dizziness EXAM: CT ABDOMEN AND PELVIS WITH CONTRAST  TECHNIQUE: Multidetector CT imaging of the abdomen and pelvis was performed using the standard protocol following bolus administration of intravenous contrast. RADIATION DOSE REDUCTION: This exam was performed according to the departmental dose-optimization program which includes automated exposure control, adjustment of the mA and/or kV according to patient size and/or use of iterative reconstruction technique. CONTRAST:  65mL OMNIPAQUE  IOHEXOL  350 MG/ML SOLN COMPARISON:  None available. FINDINGS: Motion degraded study. Lower chest: For findings above the diaphragm, please see the separately dictated CT of the chest report, which was performed concurrently. Hepatobiliary: No mass.Cholecystectomy. No intrahepatic or extrahepatic biliary ductal dilation. The portal veins are patent. Pancreas: No mass or main ductal dilation. No peripancreatic inflammation or fluid collection. Spleen: Normal size. No mass. Adrenals/Urinary Tract: No adrenal masses. No renal mass. Somewhat asymmetric enhancement of the renal parenchyma with decreased enhancement noted in the left mid and upper portions. Left perinephric stranding. No nephrolithiasis or hydronephrosis. The urinary bladder is distended without focal abnormality. Stomach/Bowel: Small hiatal hernia. The stomach contains ingested material without focal abnormality. No small bowel wall thickening or inflammation. No small bowel obstruction.The appendix was not visualized. No right lower quadrant or pericecal inflammatory changes to suggest acute appendicitis. Subtle inflammatory stranding about the splenic flexure of the colon. Vascular/Lymphatic: No aortic aneurysm. Diffuse aortoiliac atherosclerosis. No intraabdominal or pelvic lymphadenopathy. Reproductive: Hysterectomy. No concerning adnexal mass.No free pelvic fluid. Other: No pneumoperitoneum, ascites, or mesenteric inflammation. Musculoskeletal: No acute fracture or destructive lesion. Osteopenia. 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 IMPRESSION: 1. Left perinephric stranding with somewhat asymmetric enhancement of the left renal parenchyma, which may be due to a recently passed calculus or an ascending urinary tract infection. Correlation with urinalysis recommended. 2. Subtle inflammatory stranding about the splenic flexure of the colon, which may be reactive to the left perinephric stranding. Alternatively, a focal infectious or inflammatory colitis could also have this appearance in the correct clinical context. Aortic Atherosclerosis (ICD10-I70.0). Electronically Signed   By: Rance Burrows M.D.   On: 02/12/2024 16:22   CT Angio Chest Pulmonary Embolism (PE) W or WO Contrast Result Date: 02/12/2024 CLINICAL DATA:  Pulmonary embolism (PE) suspected, high prob sepsis. EXAM: CT ANGIOGRAPHY CHEST WITH CONTRAST TECHNIQUE: Multidetector CT imaging of the chest was performed using the standard protocol during bolus administration of intravenous contrast. Multiplanar CT image reconstructions and MIPs were obtained to evaluate the vascular anatomy. RADIATION DOSE REDUCTION: This exam was performed according to the departmental dose-optimization program which includes automated exposure control, adjustment of the mA and/or kV according to patient size and/or use of iterative reconstruction technique. CONTRAST:  65mL OMNIPAQUE  IOHEXOL  350 MG/ML SOLN COMPARISON:  June 11, 2023 FINDINGS: Motion degraded study. Pulmonary Embolism: No pulmonary embolism to the level of the proximal segmental pulmonary arteries bilaterally. The distal segmental and subsegmental branches are degraded by motion.  Cardiovascular: No cardiomegaly or pericardial effusion. No aortic aneurysm. Diffuse atherosclerosis. Multi-vessel coronary atherosclerosis. Mediastinum/Nodes: No mediastinal mass. No mediastinal, hilar, or axillary lymphadenopathy. Small sliding-type hiatal hernia. Lungs/Pleura: The  midline trachea and bronchi are patent. No focal airspace consolidation, pleural effusion, or pneumothorax. Unchanged 1.4 x 1.9 cm nodule in the medial left lung apex (axial 15). The solid subpleural nodule in the lateral right lower lobe (axial 82 previously), is now more ground-glass than on the prior study. Musculoskeletal: No acute fracture or destructive bone lesion. Anterior and posterior cervical fusion hardware is partially visualized. Osteopenia. Multilevel degenerative disc disease of the spine. Severe left glenohumeral joint osteoarthritis with moderate right glenohumeral joint osteoarthritis. Upper Abdomen: For findings below the diaphragm, please see the separately dictated CT of the abdomen and pelvis report, which was performed concurrently. Review of the MIP images confirms the above findings. IMPRESSION: 1. No pulmonary embolism to the level of the proximal segmental pulmonary arteries bilaterally. The distal segmental and subsegmental branches are degraded by motion. 2. No pneumonia, pulmonary edema, or pleural effusion. 3. Unchanged 1.4 x 1.9 cm nodule in the medial left lung apex (axial 15). The solid subpleural nodule in the lateral right lower lobe is more ground-glass than on the prior study. A repeat chest CT in 3-6 months is recommended. 4. Small sliding-type hiatal hernia. Aortic Atherosclerosis (ICD10-I70.0). Electronically Signed   By: Rance Burrows M.D.   On: 02/12/2024 16:07   CT Head Wo Contrast Result Date: 02/12/2024 CLINICAL DATA:  83 year old female with altered mental status, dizziness, confusion, headache times 2 days. EXAM: CT HEAD WITHOUT CONTRAST TECHNIQUE: Contiguous axial images were obtained from the base of the skull through the vertex without intravenous contrast. RADIATION DOSE REDUCTION: This exam was performed according to the departmental dose-optimization program which includes automated exposure control, adjustment of the mA and/or kV according to patient size  and/or use of iterative reconstruction technique. COMPARISON:  Head CT 10/22/2023. FINDINGS: Brain: Cerebral volume is within normal limits for age. No midline shift, ventriculomegaly, mass effect, evidence of mass lesion, intracranial hemorrhage or evidence of cortically based acute infarction. Patchy bilateral cerebral white matter hypodensity. Chronic heterogeneity in the deep gray nuclei, more so on the left. Stable gray-white matter differentiation throughout the brain. Vascular: No suspicious intracranial vascular hyperdensity. Calcified atherosclerosis at the skull base. Skull: Stable. 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 No acute osseous abnormality identified. Sinuses/Orbits: Visualized paranasal sinuses and mastoids are clear. Other: No acute orbit or scalp soft tissue finding. IMPRESSION: 1. No acute intracranial abnormality. 2. 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 Electronically Signed   By: Marlise Simpers M.D.   On: 02/12/2024 12:11   DG Chest Port 1 View Result Date: 02/12/2024 CLINICAL DATA:  Fever. EXAM: PORTABLE CHEST 1 VIEW COMPARISON:  Chest radiograph dated 09/26/2022. FINDINGS: No focal consolidation, pleural effusion or pneumothorax. The cardiac silhouette is within limits. Small metallic clip in the left upper lobe similar to prior radiograph. Atherosclerotic calcification of the aorta. No acute osseous pathology. IMPRESSION: No active disease. Electronically Signed   By: Angus Bark M.D.   On: 02/12/2024 11:31     Assessment and Plan: AFIB, RVR: - She is currently on diltiazem  IV at 7.5 mg/h.  Her blood pressure is close to 100, so cannot increase the dose. - Of note, her home metoprolol  is Toprol -XL 50 mg twice daily. - Here, she has been on Lopressor  25 mg twice daily, a decreased dose because her blood  pressure was borderline, she got it this morning.  - Lopressor  was increased to 50 mg twice daily today but she has not had this dose yet.   - Discuss if we should change her back to Toprol -XL with MD - Suspect the A-fib is secondary to her acute illness, but with her frequent ectopy at baseline, she is at high risk to have it again so anticoagulation is indicated and she is willing to proceed with this. - She has been started on Eliquis 2.5 mg twice daily which will start this p.m. which she will not be able to afford.  However, she and her daughter were given information on [facility] and that she will get a 30-day free co-pay card on discharge.  Hopefully, she will get help through this.  - Prior to starting Eliquis, she was on Lovenox  40 mg daily, last dose this a.m.  2.  CAD: - Last cath was in 2022, she got a DES to the proximal/mid LAD.  Other vessels had nonobstructive disease 10-30%. - Recent Myoview  done for chest pain showed no ischemia - She has not had any ischemic symptoms during the atrial fibrillation, tolerating it well from that standpoint.  3.  Sepsis from complex UTI: -Blood cultures have been negative so vancomycin  and Flagyl  have been stopped.  - She is on cefepime , with the plan to change her to oral antibiotics and complete a 7-day course. - The send other issues, per IM   Risk Assessment/Risk Scores:   CHA2DS2-VASc Score = 6   This indicates a 9.7% annual risk of stroke. The patient's score is based upon: CHF History: 0 HTN History: 1 Diabetes History: 1 Stroke History: 0 Vascular Disease History: 1 Age Score: 2 Gender Score: 1   For questions or updates, please contact [facility] Please consult [email or web address] for contact info under    Signed, Armandina Bernard, PA-C  02/15/2024 11:49 AM  Patient seen and examined with Armandina Bernard PA-C.  Agree as above, with the following exceptions and changes as noted below. Pt presents with sepsis and UTI, found to be in atrial fibrillation, which is new onset. She has a history of complex CAD with multiple ACS/PCI. Gen: NAD, CV: RRR, no  murmurs, Lungs: clear, Abd: soft, Extrem: Warm, well perfused, no edema, Neuro/Psych: alert and oriented x 3, normal mood and affect. All available labs, radiology testing, previous records reviewed. We have been asked to comment on antiplatelet/anticoagulant plan. OK to continue plavix  and eliquis, and will hold aspirin  going forward. She is now in SR. Will get EKG, will stop diltiazem . I have discussed this with his nurse.   Perle Gibbon A Rexann Lueras, MD 02/15/24 3:19 PM

## 2024-02-15 NOTE — Plan of Care (Signed)
  Problem: Clinical Measurements: Goal: Ability to maintain clinical measurements within normal limits will improve Outcome: Progressing   Problem: Activity: Goal: Risk for activity intolerance will decrease Outcome: Progressing   Problem: Nutrition: Goal: Adequate nutrition will be maintained Outcome: Progressing   Problem: Elimination: Goal: Will not experience complications related to bowel motility Outcome: Progressing   Problem: Safety: Goal: Ability to remain free from injury will improve Outcome: Progressing

## 2024-02-15 NOTE — Plan of Care (Signed)

## 2024-02-15 NOTE — Discharge Instructions (Signed)

## 2024-02-15 NOTE — Progress Notes (Signed)
 Converted back to NSR HR 70s. Cardizem  drip decreased to 5mg .

## 2024-02-16 DIAGNOSIS — R42 Dizziness and giddiness: Secondary | ICD-10-CM | POA: Diagnosis not present

## 2024-02-16 LAB — CBC WITH DIFFERENTIAL/PLATELET
Abs Immature Granulocytes: 0.02 10*3/uL (ref 0.00–0.07)
Basophils Absolute: 0.1 10*3/uL (ref 0.0–0.1)
Basophils Relative: 1 %
Eosinophils Absolute: 0.2 10*3/uL (ref 0.0–0.5)
Eosinophils Relative: 3 %
HCT: 35.3 % — ABNORMAL LOW (ref 36.0–46.0)
Hemoglobin: 11.7 g/dL — ABNORMAL LOW (ref 12.0–15.0)
Immature Granulocytes: 0 %
Lymphocytes Relative: 21 %
Lymphs Abs: 1.5 10*3/uL (ref 0.7–4.0)
MCH: 31 pg (ref 26.0–34.0)
MCHC: 33.1 g/dL (ref 30.0–36.0)
MCV: 93.6 fL (ref 80.0–100.0)
Monocytes Absolute: 0.9 10*3/uL (ref 0.1–1.0)
Monocytes Relative: 12 %
Neutro Abs: 4.8 10*3/uL (ref 1.7–7.7)
Neutrophils Relative %: 63 %
Platelets: 261 10*3/uL (ref 150–400)
RBC: 3.77 MIL/uL — ABNORMAL LOW (ref 3.87–5.11)
RDW: 12.9 % (ref 11.5–15.5)
WBC: 7.5 10*3/uL (ref 4.0–10.5)
nRBC: 0 % (ref 0.0–0.2)

## 2024-02-16 LAB — BRAIN NATRIURETIC PEPTIDE: B Natriuretic Peptide: 175.5 pg/mL — ABNORMAL HIGH (ref 0.0–100.0)

## 2024-02-16 LAB — BASIC METABOLIC PANEL WITH GFR
Anion gap: 9 (ref 5–15)
BUN: 9 mg/dL (ref 8–23)
CO2: 28 mmol/L (ref 22–32)
Calcium: 8.4 mg/dL — ABNORMAL LOW (ref 8.9–10.3)
Chloride: 100 mmol/L (ref 98–111)
Creatinine, Ser: 0.65 mg/dL (ref 0.44–1.00)
GFR, Estimated: >60 mL/min (ref >60)
Glucose, Bld: 168 mg/dL — ABNORMAL HIGH (ref 70–99)
Potassium: 3.6 mmol/L (ref 3.5–5.1)
Sodium: 137 mmol/L (ref 135–145)

## 2024-02-16 LAB — GLUCOSE, CAPILLARY
Glucose-Capillary: 150 mg/dL — ABNORMAL HIGH (ref 70–99)
Glucose-Capillary: 158 mg/dL — ABNORMAL HIGH (ref 70–99)
Glucose-Capillary: 172 mg/dL — ABNORMAL HIGH (ref 70–99)
Glucose-Capillary: 182 mg/dL — ABNORMAL HIGH (ref 70–99)
Glucose-Capillary: 193 mg/dL — ABNORMAL HIGH (ref 70–99)
Glucose-Capillary: 216 mg/dL — ABNORMAL HIGH (ref 70–99)

## 2024-02-16 LAB — MAGNESIUM: Magnesium: 1.9 mg/dL (ref 1.7–2.4)

## 2024-02-16 LAB — PHOSPHORUS: Phosphorus: 2.7 mg/dL (ref 2.5–4.6)

## 2024-02-16 MED ORDER — METOPROLOL TARTRATE 50 MG PO TABS
50.0000 mg | ORAL_TABLET | Freq: Two times a day (BID) | ORAL | Status: DC
  Administered 2024-02-16 – 2024-02-18 (×5): 50 mg via ORAL
  Filled 2024-02-16 (×5): qty 1

## 2024-02-16 NOTE — Progress Notes (Signed)
 PROGRESS NOTE        PATIENT DETAILS Name: [name] [name] Age: 83 y.o. Sex: female Date of Birth: 08-May-1941 Admit Date: 02/12/2024 Admitting Physician Lavanda Porter, MD ZOX:WRUEAV, Cody Das, NP  Brief Summary: Patient is a 83 y.o.  female with history of CAD-multiple PCI's-last 2022, bacterial endocarditis, HTN, HLD, DM-2 who presented to the ED with weakness, confusion, headache, dizziness-upon further evaluation-she was found to have sepsis physiology secondary to complicated UTI.  Significant events: 6/11>> admit to [facility]  Significant studies: 6/11>> CT head: No acute intracranial abnormality 6/11>> CTA chest: No PE, unchanged left lung nodule. 6/11>> CT abdomen/pelvis: Left perinephric stranding-secondary to past calculi or ascending UTI. 6/12 echocardiogram - EF 60%, mild pericardial effusion, no major valvular abnormality  Significant microbiology data: 6/11>> COVID/influenza/RSV PCR: Negative 6/11>> urine culture: Pending 6/11>> blood culture: No growth  Procedures: None  Consults: Cardiology  Subjective:  Patient in bed, appears comfortable, denies any headache, no fever, no chest pain or pressure, no shortness of breath , no abdominal pain. No new focal weakness.   Objective: Vitals: Blood pressure (!) 138/59, pulse 82, temperature (!) 97.4 F (36.3 C), temperature source Oral, resp. rate 15, height 5' (1.524 m), weight 56.2 kg, SpO2 97%.   Exam:  Awake Alert, No new F.N deficits, Normal affect [place].AT,PERRAL Supple Neck, No JVD,   Symmetrical Chest wall movement, Good air movement bilaterally, CTAB iRRR,No Gallops, Rubs or new Murmurs,  +ve B.Sounds, Abd Soft, No tenderness,   No Cyanosis, Clubbing or edema    Assessment/Plan:  Sepsis secondary to complicated UTI/nephritis (POA) Low-grade fever in the afternoon on 6/12 Blood cultures negative so far Urine culture growing multiple species, suggested recollection Since blood  cultures negative-stop vancomycin -do not think she needs anaerobic coverage-stop Flagyl . Continue cefepime  for now, de-escalate to oral antibiotics on discharge to complete total 7-day course  Hypokalemia, and hypophosphatemia, repleted with Neutra-Phos. Monitor electrolytes and replete as needed Magnesium  1.8, Monitor   New onset A-fib RVR, likely paroxysmal.  [place] vas 2 score of greater than 4.  Went into A-fib 8:52 AM 02/15/2024, quired Cardizem  drip, converted to sinus rhythm evening of 02/15/2024, now on increased than home dose beta-blocker orally along with Eliquis. Recent echo noted with preserved EF, stable TSH and free T4.  Discussed with cardiology appreciate their input, now on Eliquis and Plavix , aspirin  discontinued.    Chronic HFpEF Relatively euvolemic Continue beta-blocker Echo LVEF 65 to 70%, mild pericardial effusion, no tamponade, no any other significant findings  CAD-history of multiple PCI's-last in 2022. No anginal symptoms Continue DAPT along with statin/metoprolol  dose increased on 02/15/2024, now on Cardizem  drip as well.     HTN BP stable Continue beta-blocker, dose increased  Dizziness, resolved Check orthostatics Mobilize with PT/OT CT head negative.  Acute metabolic encephalopathy Likely secondary to sepsis/UTI. Lethargic/confused on initial presentation-change from prior baseline Seems to be slowly improving-more alert-still pretty weak-continue to treat underlying UTI/sepsis physiology. 6/13 she is back to her baseline, AME resolved    Reported remote history of bacterial endocarditis (approximately 4-5 years ago when she was living in [place] ) Thankfully blood cultures negative so far Follow blood [name] until final.  CKD stage IIIb Creatinine close to baseline. Trend electrolytes.  GERD PPI  Left reverse shoulder arthroplasty planned with [facility] Recently evaluated by cardiology-felt to be acceptable risk to proceed with  surgery.  Left lung nodule: Seen incidentally Unchanged 1.4 x 1.9 cm nodule-from prior studies per radiology report Repeat chest in 3 to 6 months recommended by radiology  Debility/deconditioning Secondary to acute illness/sepsis physiology PT/OT eval.   DM-2 (A1c 7.3 on 6/11) SSI while inpatient Managed with diet at home  Recent Labs    02/15/24 2328 02/16/24 0423 02/16/24 0733  GLUCAP 136* 150* 158*        Code status:   Code Status: Limited: Do not attempt resuscitation (DNR) -DNR-LIMITED -Do Not Intubate/DNI    DVT Prophylaxis: apixaban (ELIQUIS) tablet 2.5 mg Start: 02/15/24 2200 apixaban (ELIQUIS) tablet 2.5 mg   Family Communication: grand daughter bedside on 02/15/2024.   Disposition Plan: Status is: Inpatient Remains inpatient appropriate because: Severity of illness   Planned Discharge Destination: SNF   Diet: Diet Order             Diet Heart Room service appropriate? Yes with Assist; Fluid consistency: Thin  Diet effective now                     Data Review:   Inpatient Medications  Scheduled Meds:  apixaban  2.5 mg Oral BID   atorvastatin   80 mg Oral Daily   Chlorhexidine  Gluconate Cloth  6 each Topical Daily   clopidogrel   75 mg Oral q morning   ezetimibe   10 mg Oral q morning   feeding supplement  1 Container Oral TID BM   insulin  aspart  0-9 Units Subcutaneous TID WC   metoprolol  tartrate  50 mg Oral BID   multivitamin with minerals  1 tablet Oral Daily   pantoprazole   40 mg Oral Q1200   tamsulosin  0.4 mg Oral Daily   venlafaxine  XR  37.5 mg Oral q morning   Continuous Infusions:  ceFEPime  (MAXIPIME ) IV 2 g (02/16/24 0300)   PRN Meds:.acetaminophen  **OR** acetaminophen , diltiazem , ondansetron  (ZOFRAN ) IV, prochlorperazine   DVT Prophylaxis  apixaban (ELIQUIS) tablet 2.5 mg Start: 02/15/24 2200 apixaban (ELIQUIS) tablet 2.5 mg    Recent Labs  Lab 02/12/24 1039 02/12/24 1055 02/13/24 0758 02/14/24 0721  02/15/24 0728 02/16/24 0759  WBC 12.6*  --  8.7 6.2 6.4 7.5  HGB 13.9 15.3* 12.3 12.6 11.9* 11.7*  HCT 44.1 45.0 37.3 38.5 35.5* 35.3*  PLT 233  --  197 186 226 261  MCV 98.4  --  94.2 93.7 91.5 93.6  MCH 31.0  --  31.1 30.7 30.7 31.0  MCHC 31.5  --  33.0 32.7 33.5 33.1  RDW 13.2  --  12.9 12.8 12.9 12.9  LYMPHSABS 0.8  --   --   --  1.1 1.5  MONOABS 0.8  --   --   --  1.2* 0.9  EOSABS 0.0  --   --   --  0.1 0.2  BASOSABS 0.1  --   --   --  0.1 0.1    Recent Labs  Lab 02/12/24 1039 02/12/24 1055 02/12/24 1056 02/12/24 1359 02/12/24 1607 02/12/24 2118 02/13/24 0758 02/14/24 0721 02/14/24 1042 02/15/24 0728  NA 131* 132*  --   --   --   --  131* 134*  --  136  K 4.4 4.3  --   --   --   --  3.6 3.3*  --  3.3*  CL 97* 94*  --   --   --   --  96* 96*  --  96*  CO2 22  --   --   --   --   --  25 26  --  30  ANIONGAP 12  --   --   --   --   --  10 12  --  10  GLUCOSE 330* 333*  --   --   --   --  184* 160*  --  173*  BUN 25* 28*  --   --   --   --  13 13  --  13  CREATININE 1.30* 1.20*  --   --   --   --  0.81 0.82  --  0.68  AST 22  --   --   --   --   --  21  --   --   --   ALT 17  --   --   --   --   --  17  --   --   --   ALKPHOS 72  --   --   --   --   --  53  --   --   --   BILITOT 0.8  --   --   --   --   --  0.8  --   --   --   ALBUMIN 3.0*  --   --   --   --   --  2.4*  --   --   --   CRP  --   --   --   --  13.3*  --   --   --   --   --   PROCALCITON 25.08  --   --   --   --   --  12.10  --   --   --   LATICACIDVEN  --   --  2.0* 4.0*  --  1.4  --   --   --   --   INR 1.2  --   --   --   --   --   --   --   --   --   TSH  --   --   --   --   --   --   --   --   --  2.863  HGBA1C 7.3*  --   --   --   --   --   --   --   --   --   BNP  --   --   --   --   --  164.4*  --   --   --  86.3  MG  --   --   --   --   --   --   --   --  1.8 1.9  PHOS  --   --   --   --   --   --   --   --  2.3* 3.7  CALCIUM  8.4*  --   --   --   --   --  7.8* 8.2*  --  8.0*      Recent  Labs  Lab 02/12/24 1039 02/12/24 1056 02/12/24 1359 02/12/24 1607 02/12/24 2118 02/13/24 0758 02/14/24 0721 02/14/24 1042 02/15/24 0728  CRP  --   --   --  13.3*  --   --   --   --   --   PROCALCITON 25.08  --   --   --   --  12.10  --   --   --   LATICACIDVEN  --  2.0* 4.0*  --  1.4  --   --   --   --   INR 1.2  --   --   --   --   --   --   --   --   TSH  --   --   --   --   --   --   --   --  2.863  HGBA1C 7.3*  --   --   --   --   --   --   --   --   BNP  --   --   --   --  164.4*  --   --   --  86.3  MG  --   --   --   --   --   --   --  1.8 1.9  CALCIUM  8.4*  --   --   --   --  7.8* 8.2*  --  8.0*    --------------------------------------------------------------------------------------------------------------- Lab Results  Component Value Date   CHOL 109 10/04/2021   HDL 48.00 10/04/2021   LDLCALC 39 10/04/2021   LDLDIRECT 42.0 09/12/2020   TRIG 110.0 10/04/2021   CHOLHDL 2 10/04/2021    Lab Results  Component Value Date   HGBA1C 7.3 (H) 02/12/2024   Recent Labs    02/15/24 0728  TSH 2.863  FREET4 0.92   No results for input(s): VITAMINB12, FOLATE, FERRITIN, TIBC, IRON, RETICCTPCT in the last 72 hours. ------------------------------------------------------------------------------------------------------------------ Cardiac Enzymes No results for input(s): CKMB, TROPONINI, MYOGLOBIN in the last 168 hours.  Invalid input(s): CK  Micro Results Recent Results (from the past 240 hours)  Blood Culture (routine x 2)     Status: None (Preliminary result)   Collection Time: 02/12/24 10:38 AM   Specimen: BLOOD RIGHT FOREARM  Result Value Ref Range Status   Specimen Description BLOOD RIGHT FOREARM  Final   Special Requests   Final    BOTTLES DRAWN AEROBIC AND ANAEROBIC Blood Culture results may not be optimal due to an inadequate volume of blood received in culture bottles   Culture   Final    NO GROWTH 4 DAYS Performed at [facility], [address] [place], [place] [address]    Report Status PENDING  Incomplete  Blood Culture (routine x 2)     Status: None (Preliminary result)   Collection Time: 02/12/24 10:45 AM   Specimen: BLOOD  Result Value Ref Range Status   Specimen Description BLOOD SITE NOT SPECIFIED  Final   Special Requests   Final    BOTTLES DRAWN AEROBIC AND ANAEROBIC Blood Culture results may not be optimal due to an inadequate volume of blood received in culture bottles   Culture   Final    NO GROWTH 4 DAYS Performed at [facility], [address], [place], [place] [address]    Report Status PENDING  Incomplete  Resp panel by RT-PCR (RSV, Flu A&B, Covid) Anterior Nasal Swab     Status: None   Collection Time: 02/12/24 11:06 AM   Specimen: Anterior Nasal Swab  Result Value Ref Range Status   SARS Coronavirus 2 by RT PCR NEGATIVE NEGATIVE Final   Influenza A by PCR NEGATIVE NEGATIVE Final   Influenza B by PCR NEGATIVE NEGATIVE Final    Comment: (NOTE) The Xpert Xpress SARS-CoV-2/FLU/RSV plus assay is intended as an aid in the diagnosis of influenza from Nasopharyngeal swab specimens and should not be used  as a sole basis for treatment. Nasal washings and aspirates are unacceptable for Xpert Xpress SARS-CoV-2/FLU/RSV testing.  Fact Sheet for Patients: BloggerCourse.com  Fact Sheet for Healthcare Providers: SeriousBroker.it  This test is not yet approved or cleared by the [place]  FDA and has been authorized for detection and/or diagnosis of SARS-CoV-2 by FDA under an Emergency Use Authorization (EUA). This EUA will remain in effect (meaning this test can be used) for the duration of the COVID-19 declaration under Section 564(b)(1) of the Act, 21 U.S.C. section 360bbb-3(b)(1), unless the authorization is terminated or revoked.     Resp Syncytial Virus by PCR NEGATIVE NEGATIVE Final    Comment: (NOTE) Fact Sheet for  Patients: BloggerCourse.com  Fact Sheet for Healthcare Providers: SeriousBroker.it  This test is not yet approved or cleared by the [place]  FDA and has been authorized for detection and/or diagnosis of SARS-CoV-2 by FDA under an Emergency Use Authorization (EUA). This EUA will remain in effect (meaning this test can be used) for the duration of the COVID-19 declaration under Section 564(b)(1) of the Act, 21 U.S.C. section 360bbb-3(b)(1), unless the authorization is terminated or revoked.  Performed at [facility], [address], [place], [place] [address]   Urine Culture     Status: Abnormal   Collection Time: 02/12/24 11:49 AM   Specimen: Urine, Catheterized  Result Value Ref Range Status   Specimen Description URINE, CATHETERIZED  Final   Special Requests   Final    NONE Reflexed from [id number] Performed at [facility], [address] [place], [place] [address]    Culture MULTIPLE SPECIES PRESENT, SUGGEST RECOLLECTION (A)  Final   Report Status 02/13/2024 FINAL  Final    Radiology Reports  No results found.    Signature  -   Lynnwood Sauer M.D on 02/16/2024 at 8:57 AM   -  To page go to [email or web address]

## 2024-02-16 NOTE — Plan of Care (Signed)

## 2024-02-16 NOTE — Progress Notes (Signed)
 Mobility Specialist Progress Note;   02/16/24 1400  Therapy Vitals  BP (!) 132/59  Mobility  Activity Transferred to/from [facility];Transferred from bed to chair  Level of Assistance Contact guard assist, steadying assist  Assistive Device Other (Comment) (HHA)  Distance Ambulated (ft) 5 ft  Activity Response Tolerated well  Mobility Referral Yes  Mobility visit 1 Mobility  Mobility Specialist Start Time (ACUTE ONLY) 1400  Mobility Specialist Stop Time (ACUTE ONLY) 1420  Mobility Specialist Time Calculation (min) (ACUTE ONLY) 20 min    Post-mobility: BP 132/59 (82)  Pt agreeable to mobility. Required MinG assistance via HHA to transfer pt from bed to [facility] then to chair. BM successful, assisted with pericare. BP taken once pt seated in chair (see above). HR in 90s throughout activity. Pt left in chair with all needs met, alarm on.  Janit Meline Mobility Specialist Please contact via SecureChat or [employer] [phone]

## 2024-02-17 DIAGNOSIS — R42 Dizziness and giddiness: Secondary | ICD-10-CM | POA: Diagnosis not present

## 2024-02-17 LAB — CBC WITH DIFFERENTIAL/PLATELET
Abs Immature Granulocytes: 0.06 10*3/uL (ref 0.00–0.07)
Basophils Absolute: 0.1 10*3/uL (ref 0.0–0.1)
Basophils Relative: 1 %
Eosinophils Absolute: 0.3 10*3/uL (ref 0.0–0.5)
Eosinophils Relative: 3 %
HCT: 35.3 % — ABNORMAL LOW (ref 36.0–46.0)
Hemoglobin: 11.7 g/dL — ABNORMAL LOW (ref 12.0–15.0)
Immature Granulocytes: 1 %
Lymphocytes Relative: 19 %
Lymphs Abs: 1.7 10*3/uL (ref 0.7–4.0)
MCH: 30.9 pg (ref 26.0–34.0)
MCHC: 33.1 g/dL (ref 30.0–36.0)
MCV: 93.1 fL (ref 80.0–100.0)
Monocytes Absolute: 1 10*3/uL (ref 0.1–1.0)
Monocytes Relative: 11 %
Neutro Abs: 5.8 10*3/uL (ref 1.7–7.7)
Neutrophils Relative %: 65 %
Platelets: 320 10*3/uL (ref 150–400)
RBC: 3.79 MIL/uL — ABNORMAL LOW (ref 3.87–5.11)
RDW: 12.8 % (ref 11.5–15.5)
WBC: 9 10*3/uL (ref 4.0–10.5)
nRBC: 0 % (ref 0.0–0.2)

## 2024-02-17 LAB — CULTURE, BLOOD (ROUTINE X 2)
Culture: NO GROWTH
Culture: NO GROWTH

## 2024-02-17 LAB — BASIC METABOLIC PANEL WITH GFR
Anion gap: 9 (ref 5–15)
BUN: 13 mg/dL (ref 8–23)
CO2: 25 mmol/L (ref 22–32)
Calcium: 8 mg/dL — ABNORMAL LOW (ref 8.9–10.3)
Chloride: 102 mmol/L (ref 98–111)
Creatinine, Ser: 0.71 mg/dL (ref 0.44–1.00)
GFR, Estimated: >60 mL/min (ref >60)
Glucose, Bld: 170 mg/dL — ABNORMAL HIGH (ref 70–99)
Potassium: 3.7 mmol/L (ref 3.5–5.1)
Sodium: 136 mmol/L (ref 135–145)

## 2024-02-17 LAB — GLUCOSE, CAPILLARY
Glucose-Capillary: 197 mg/dL — ABNORMAL HIGH (ref 70–99)
Glucose-Capillary: 249 mg/dL — ABNORMAL HIGH (ref 70–99)
Glucose-Capillary: 165 mg/dL — ABNORMAL HIGH (ref 70–99)
Glucose-Capillary: 172 mg/dL — ABNORMAL HIGH (ref 70–99)

## 2024-02-17 LAB — PHOSPHORUS: Phosphorus: 2 mg/dL — ABNORMAL LOW (ref 2.5–4.6)

## 2024-02-17 LAB — BRAIN NATRIURETIC PEPTIDE: B Natriuretic Peptide: 82.9 pg/mL (ref 0.0–100.0)

## 2024-02-17 LAB — MAGNESIUM: Magnesium: 1.9 mg/dL (ref 1.7–2.4)

## 2024-02-17 MED ORDER — POTASSIUM PHOSPHATES 15 MMOLE/5ML IV SOLN
30.0000 mmol | Freq: Two times a day (BID) | INTRAVENOUS | Status: DC
  Administered 2024-02-17: 30 mmol via INTRAVENOUS
  Filled 2024-02-17 (×2): qty 10

## 2024-02-17 MED ORDER — K PHOS MONO-SOD PHOS DI & MONO 155-852-130 MG PO TABS
500.0000 mg | ORAL_TABLET | Freq: Four times a day (QID) | ORAL | Status: DC
  Administered 2024-02-17 (×3): 500 mg via ORAL
  Filled 2024-02-17 (×3): qty 2

## 2024-02-17 NOTE — TOC Progression Note (Addendum)
 Transition of Care [facility]) - Progression Note    Patient Details  Name: [name] [name] MRN: [id number] Date of Birth: 1941-06-20  [facility] CM/SW Contact  Jannice Mends, LCSW Phone Number: 02/17/2024, 1:31 PM  Clinical Narrative:    1pm-CSW met with patient and daughter at bedside and provided SNF bed offers and Medicare ratings list. They will review and call CSW back.   2:04 PM-Daughter contacted CSW and family is requesting 1-[facility] and 2-[facility]. CSW awaiting confirmation from [facility] on bed availability for tomorrow.   CSW can begin insurance process once updated therapy note is in.   3pm-[facility] only able to offer a semi private room. Daughter inquired if [facility] has a private and CSW confirmed they do. Patient would like to proceed with [facility]. CSW updated facility.  4:38 PM-CSW submitted clinicals to insurance for review, Ref# [id number].   Expected Discharge Plan: Skilled Nursing Facility Barriers to Discharge: Continued Medical Work up, English as a second language teacher, SNF Pending bed offer  Expected Discharge Plan and Services In-house Referral: Clinical Social Work   Post Acute Care Choice: Skilled Nursing Facility Living arrangements for the past 2 months: Single Family Home                                       Social Determinants of Health (SDOH) Interventions SDOH Screenings   Food Insecurity: No Food Insecurity (02/13/2023)  Housing: Low Risk  (02/13/2023)  Transportation Needs: No Transportation Needs (02/13/2023)  Utilities: Not At Risk (02/13/2023)  Alcohol  Screen: Low Risk  (11/03/2021)  Depression (PHQ2-9): Low Risk  (05/18/2022)  Financial Resource Strain: Low Risk  (11/03/2021)  Physical Activity: Sufficiently Active (11/03/2021)  Social Connections: Unknown (05/11/2022)   Received from [facility]  Stress: No Stress Concern Present (11/03/2021)  Tobacco Use: Low Risk  (02/12/2024)    Readmission Risk Interventions     No data to  display

## 2024-02-17 NOTE — Progress Notes (Signed)
   02/17/24 1404  Mobility  Activity Transferred from bed to chair;Transferred to/from [facility]  Level of Assistance Minimal assist, patient does 75% or more  Assistive Device Other (Comment);BSC (HHA/ Armrests)  Activity Response Tolerated well  Mobility Referral Yes  Mobility visit 1 Mobility  Mobility Specialist Start Time (ACUTE ONLY) 1404  Mobility Specialist Stop Time (ACUTE ONLY) 1419  Mobility Specialist Time Calculation (min) (ACUTE ONLY) 15 min   Mobility Specialist: Progress Note  Pt agreeable to mobility session - received in bed. C/o needing to use the BR, void and small solid brown BM complete, required pericare  with assistance. Returned to chair with all needs met - call bell within reach. Bed alarm on. Son and daughter in law present.   [name], BS Mobility Specialist Please contact via SecureChat or  Rehab office at [phone].

## 2024-02-17 NOTE — Progress Notes (Addendum)
 Physical Therapy Treatment Patient Details Name: [name] [name] MRN: [id number] DOB: [birth year] Today's Date: 02/17/2024   History of Present Illness Patient is 83 y.o. female who presented to the ED with weakness, confusion, headache, dizziness. In ED workup revealed sepsis physiology secondary to complicated UTI. PMH significant for DMII, HTN, HLD, chronic diastolic heart failure, CAD, chronic hyponatremia, hxo f Stroke,    PT Comments  Patient making steady progress with mobility. Required CGA for safety with bed mobility and reliant on bed features to complete. Pt advanced ambulation distance with RW for support, CGA to min assist for balance and to steady gait direction. Pt noted to drift to Rt and needed repeated cues to direct gait path. 5xsit<>stand completed from low/soft EOB and pt able to power up without use of Ue's, pt with posterior LOB on initial stand and able to correct on following reps (see time details below). EOS pt returned to bed due to fatigue. Will continue to benefit from skilled PT interventions. Recommend continued inpatient follow up therapy, <3 hours/day.     If plan is discharge home, recommend the following: Assistance with cooking/housework;Direct supervision/assist for medications management;Assist for transportation;Help with stairs or ramp for entrance;A little help with walking and/or transfers;A little help with bathing/dressing/bathroom   Can travel by private vehicle     No  Equipment Recommendations  Rolling walker (2 wheels)    Recommendations for Other Services       Precautions / Restrictions Precautions Precautions: Fall Recall of Precautions/Restrictions: Intact Precaution/Restrictions Comments: hx of falls Restrictions Weight Bearing Restrictions Per Provider Order: No    Five times Sit to Stand Test (FTSS)  TIME: ___13.95___ (in seconds)  Times > 13.6 seconds is associated with increased disability and morbidity (Guralnik,  2000) Times > 15 seconds is predictive of recurrent falls in healthy individuals aged 89 and older (Buatois, et al., 2008) Normal performance values in community dwelling individuals aged 15 and older (Bohannon, 2006): 60-69 years: 11.4 seconds 70-79 years: 12.6 seconds 80-89 years: 14.8 seconds  MCID: >= 2.3 seconds for Vestibular Disorders (Meretta, 2006)  Mobility  Bed Mobility Overal bed mobility: Needs Assistance Bed Mobility: Supine to Sit, Sit to Supine     Supine to sit: Supervision, HOB elevated Sit to supine: Supervision, HOB elevated, Used rails   General bed mobility comments: use of bed features and extra time    Transfers Overall transfer level: Needs assistance Equipment used: None Transfers: Sit to/from Stand Sit to Stand: Contact guard assist           General transfer comment: CGA for safety with power up from EOB to RW. pt able to complete sit<>stand without UE use from low bed height.    Ambulation/Gait Ambulation/Gait assistance: Contact guard assist, Min assist Gait Distance (Feet): 140 Feet Assistive device: Rolling walker (2 wheels) Gait Pattern/deviations: Step-through pattern, Decreased stride length, Shuffle Gait velocity: decr     General Gait Details: pt drifting to Rt requiring assist to steady and correct, no buckling or overt LOB. cues for safe proximity to RW.pt required standing break.   Stairs             Wheelchair Mobility     Tilt Bed    Modified Rankin (Stroke Patients Only)       Balance Overall balance assessment: Needs assistance Sitting-balance support: No upper extremity supported, Feet supported Sitting balance-Leahy Scale: Fair     Standing balance support: No upper extremity supported, During functional activity Standing balance-Leahy Scale: Fair  Communication Communication Communication: Impaired Factors Affecting Communication: Hearing impaired   Cognition Arousal: Alert Behavior During Therapy: WFL for tasks assessed/performed   PT - Cognitive impairments: No apparent impairments                       PT - Cognition Comments: improved from eval, pt aware of situation, timeline, POC Following commands: Intact      Cueing Cueing Techniques: Verbal cues, Gestural cues  Exercises      General Comments        Pertinent Vitals/Pain Pain Assessment Pain Assessment: No/denies pain    Home Living                          Prior Function            PT Goals (current goals can now be found in the care plan section) Acute Rehab PT Goals Patient Stated Goal: get stronger, stop falling PT Goal Formulation: With patient Time For Goal Achievement: 02/27/24 Potential to Achieve Goals: Fair Progress towards PT goals: Progressing toward goals    Frequency    Min 2X/week      PT Plan      Co-evaluation              AM-PAC PT 6 Clicks Mobility   Outcome Measure  Help needed turning from your back to your side while in a flat bed without using bedrails?: A Little Help needed moving from lying on your back to sitting on the side of a flat bed without using bedrails?: A Little Help needed moving to and from a bed to a chair (including a wheelchair)?: A Little Help needed standing up from a chair using your arms (e.g., wheelchair or bedside chair)?: A Little Help needed to walk in hospital room?: A Little Help needed climbing 3-5 steps with a railing? : Total 6 Click Score: 16    End of Session Equipment Utilized During Treatment: Gait belt Activity Tolerance: Patient tolerated treatment well Patient left: in bed;with call bell/phone within reach;with bed alarm set Nurse Communication: Mobility status PT Visit Diagnosis: Unsteadiness on feet (R26.81);Repeated falls (R29.6);Muscle weakness (generalized) (M62.81);Other abnormalities of gait and mobility (R26.89);Difficulty in walking, not  elsewhere classified (R26.2);Other symptoms and signs involving the nervous system (R29.898)     Time: 1610-9604 PT Time Calculation (min) (ACUTE ONLY): 23 min  Charges:    $Gait Training: 8-22 mins $Therapeutic Activity: 8-22 mins PT General Charges $$ ACUTE PT VISIT: 1 Visit                     Tish Forge, DPT Acute Rehabilitation Services Office [phone]  02/17/24 3:03 PM

## 2024-02-17 NOTE — Progress Notes (Addendum)
 PROGRESS NOTE        PATIENT DETAILS Name: [name] [name] Age: 83 y.o. Sex: female Date of Birth: [birth year] Admit Date: 02/12/2024 Admitting Physician Lavanda Porter, MD VZD:GLOVFI, Cody Das, NP  Brief Summary: Patient is a 83 y.o.  female with history of CAD-multiple PCI's-last 2022, bacterial endocarditis, HTN, HLD, DM-2 who presented to the ED with weakness, confusion, headache, dizziness-upon further evaluation-she was found to have sepsis physiology secondary to complicated UTI.  Significant events: 6/11>> admit to [facility]  Significant studies: 6/11>> CT head: No acute intracranial abnormality 6/11>> CTA chest: No PE, unchanged left lung nodule. 6/11>> CT abdomen/pelvis: Left perinephric stranding-secondary to past calculi or ascending UTI. 6/12 echocardiogram - EF 60%, mild pericardial effusion, no major valvular abnormality  Significant microbiology data: 6/11>> COVID/influenza/RSV PCR: Negative 6/11>> urine culture: Pending 6/11>> blood culture: No growth  Procedures: None  Consults: Cardiology  Subjective:  Patient in bed, appears comfortable, denies any headache, no fever, no chest pain or pressure, no shortness of breath , no abdominal pain. No new focal weakness.   Objective: Vitals: Blood pressure (!) 143/65, pulse 86, temperature 98 F (36.7 C), temperature source Oral, resp. rate 11, height 5' (1.524 m), weight 56.2 kg, SpO2 92%.   Exam:  Awake Alert, No new F.N deficits, Normal affect Early.AT,PERRAL Supple Neck, No JVD,   Symmetrical Chest wall movement, Good air movement bilaterally, CTAB iRRR,No Gallops, Rubs or new Murmurs,  +ve B.Sounds, Abd Soft, No tenderness,   No Cyanosis, Clubbing or edema    Assessment/Plan:  Sepsis secondary to complicated UTI/nephritis (POA) Low-grade fever in the afternoon on 6/12 Blood cultures negative so far Urine culture growing multiple species, suggested recollection Since blood cultures  negative-stop vancomycin -do not think she needs anaerobic coverage-stop Flagyl . Continue cefepime  for now, de-escalate to oral antibiotics on discharge to complete total 7-day course  Hypokalemia, and hypophosphatemia, repleted with Neutra-Phos. Monitor electrolytes and replete as needed Magnesium  1.8, Monitor   New onset A-fib RVR, likely paroxysmal.  [place] vas 2 score of greater than 4.  Went into A-fib 8:52 AM 02/15/2024, quired Cardizem  drip, converted to sinus rhythm evening of 02/15/2024, rate better, Cardizem  drip stopped, now on increased than home dose beta-blocker orally along with Eliquis. Recent echo noted with preserved EF 60%, stable TSH and free T4.  Discussed with cardiology appreciate their input, now on Eliquis and Plavix , aspirin  discontinued.    Chronic HFpEF Relatively euvolemic Continue beta-blocker Echo LVEF 60%, mild pericardial effusion, no tamponade, no any other significant findings  CAD-history of multiple PCI's-last in 2022. No anginal symptoms Continue DAPT along with statin/metoprolol  dose increased on 02/15/2024,    HTN BP stable Continue beta-blocker, dose increased for better heart rate control and blood pressure control  Dizziness, resolved Check orthostatics Mobilize with PT/OT CT head negative.  Acute metabolic encephalopathy Likely secondary to sepsis/UTI. Lethargic/confused on initial presentation-change from prior baseline Seems to be slowly improving-more alert-still pretty weak-continue to treat underlying UTI/sepsis physiology. 6/13 she is back to her baseline, AME resolved    Reported remote history of bacterial endocarditis (approximately 4-5 years ago when she was living in [place] ) Thankfully blood cultures negative so far Follow blood [name] until final.  CKD stage IIIb Creatinine close to baseline. Trend electrolytes.  GERD PPI  Left reverse shoulder arthroplasty planned with [facility] Recently evaluated by cardiology-felt to  be acceptable  risk to proceed with surgery.  Left lung nodule: Seen incidentally Unchanged 1.4 x 1.9 cm nodule-from prior studies per radiology report Repeat chest in 3 to 6 months recommended by radiology  Debility/deconditioning Secondary to acute illness/sepsis physiology PT/OT eval.  Nighttime apneic episodes.  Likely has undiagnosed sleep apnea, outpatient sleep study, nighttime oxygen if qualifies for now.   DM-2 (A1c 7.3 on 6/11) SSI while inpatient Managed with diet at home  Recent Labs    02/16/24 1945 02/16/24 2333 02/17/24 0844  GLUCAP 172* 193* 197*        Code status:   Code Status: Limited: Do not attempt resuscitation (DNR) -DNR-LIMITED -Do Not Intubate/DNI    DVT Prophylaxis: apixaban (ELIQUIS) tablet 2.5 mg Start: 02/15/24 2200 apixaban (ELIQUIS) tablet 2.5 mg   Family Communication: grand daughter bedside on 02/15/2024.  Daughter bedside 02/17/2024   Disposition Plan: Status is: Inpatient Remains inpatient appropriate because: Severity of illness   Planned Discharge Destination: SNF   Diet: Diet Order             Diet Heart Room service appropriate? Yes with Assist; Fluid consistency: Thin  Diet effective now                     Data Review:   Inpatient Medications  Scheduled Meds:  apixaban  2.5 mg Oral BID   atorvastatin   80 mg Oral Daily   Chlorhexidine  Gluconate Cloth  6 each Topical Daily   clopidogrel   75 mg Oral q morning   ezetimibe   10 mg Oral q morning   feeding supplement  1 Container Oral TID BM   insulin  aspart  0-9 Units Subcutaneous TID WC   metoprolol  tartrate  50 mg Oral BID   multivitamin with minerals  1 tablet Oral Daily   pantoprazole   40 mg Oral Q1200   tamsulosin  0.4 mg Oral Daily   venlafaxine  XR  37.5 mg Oral q morning   Continuous Infusions:  ceFEPime  (MAXIPIME ) IV 2 g (02/17/24 0229)   potassium PHOSPHATE IVPB (in mmol)     PRN Meds:.acetaminophen  **OR** acetaminophen , diltiazem , ondansetron   (ZOFRAN ) IV, prochlorperazine   DVT Prophylaxis  apixaban (ELIQUIS) tablet 2.5 mg Start: 02/15/24 2200 apixaban (ELIQUIS) tablet 2.5 mg    Recent Labs  Lab 02/12/24 1039 02/12/24 1055 02/13/24 0758 02/14/24 0721 02/15/24 0728 02/16/24 0759 02/17/24 0541  WBC 12.6*  --  8.7 6.2 6.4 7.5 9.0  HGB 13.9   < > 12.3 12.6 11.9* 11.7* 11.7*  HCT 44.1   < > 37.3 38.5 35.5* 35.3* 35.3*  PLT 233  --  197 186 226 261 320  MCV 98.4  --  94.2 93.7 91.5 93.6 93.1  MCH 31.0  --  31.1 30.7 30.7 31.0 30.9  MCHC 31.5  --  33.0 32.7 33.5 33.1 33.1  RDW 13.2  --  12.9 12.8 12.9 12.9 12.8  LYMPHSABS 0.8  --   --   --  1.1 1.5 1.7  MONOABS 0.8  --   --   --  1.2* 0.9 1.0  EOSABS 0.0  --   --   --  0.1 0.2 0.3  BASOSABS 0.1  --   --   --  0.1 0.1 0.1   < > = values in this interval not displayed.    Recent Labs  Lab 02/12/24 1039 02/12/24 1055 02/12/24 1056 02/12/24 1359 02/12/24 1607 02/12/24 2118 02/13/24 0758 02/14/24 0721 02/14/24 1042 02/15/24 0728 02/16/24 0759 02/17/24 0541  NA  131*   < >  --   --   --   --  131* 134*  --  136 137 136  K 4.4   < >  --   --   --   --  3.6 3.3*  --  3.3* 3.6 3.7  CL 97*   < >  --   --   --   --  96* 96*  --  96* 100 102  CO2 22  --   --   --   --   --  25 26  --  30 28 25   ANIONGAP 12  --   --   --   --   --  10 12  --  10 9 9   GLUCOSE 330*   < >  --   --   --   --  184* 160*  --  173* 168* 170*  BUN 25*   < >  --   --   --   --  13 13  --  13 9 13   CREATININE 1.30*   < >  --   --   --   --  0.81 0.82  --  0.68 0.65 0.71  AST 22  --   --   --   --   --  21  --   --   --   --   --   ALT 17  --   --   --   --   --  17  --   --   --   --   --   ALKPHOS 72  --   --   --   --   --  53  --   --   --   --   --   BILITOT 0.8  --   --   --   --   --  0.8  --   --   --   --   --   ALBUMIN 3.0*  --   --   --   --   --  2.4*  --   --   --   --   --   CRP  --   --   --   --  13.3*  --   --   --   --   --   --   --   PROCALCITON 25.08  --   --   --   --   --   12.10  --   --   --   --   --   LATICACIDVEN  --   --  2.0* 4.0*  --  1.4  --   --   --   --   --   --   INR 1.2  --   --   --   --   --   --   --   --   --   --   --   TSH  --   --   --   --   --   --   --   --   --  2.863  --   --   HGBA1C 7.3*  --   --   --   --   --   --   --   --   --   --   --   BNP  --   --   --   --   --  164.4*  --   --   --  86.3 175.5* 82.9  MG  --   --   --   --   --   --   --   --  1.8 1.9 1.9 1.9  PHOS  --   --   --   --   --   --   --   --  2.3* 3.7 2.7 2.0*  CALCIUM  8.4*  --   --   --   --   --  7.8* 8.2*  --  8.0* 8.4* 8.0*   < > = values in this interval not displayed.      Recent Labs  Lab 02/12/24 1039 02/12/24 1056 02/12/24 1359 02/12/24 1607 02/12/24 2118 02/13/24 0758 02/14/24 0721 02/14/24 1042 02/15/24 0728 02/16/24 0759 02/17/24 0541  CRP  --   --   --  13.3*  --   --   --   --   --   --   --   PROCALCITON 25.08  --   --   --   --  12.10  --   --   --   --   --   LATICACIDVEN  --  2.0* 4.0*  --  1.4  --   --   --   --   --   --   INR 1.2  --   --   --   --   --   --   --   --   --   --   TSH  --   --   --   --   --   --   --   --  2.863  --   --   HGBA1C 7.3*  --   --   --   --   --   --   --   --   --   --   BNP  --   --   --   --  164.4*  --   --   --  86.3 175.5* 82.9  MG  --   --   --   --   --   --   --  1.8 1.9 1.9 1.9  CALCIUM  8.4*  --   --   --   --  7.8* 8.2*  --  8.0* 8.4* 8.0*    --------------------------------------------------------------------------------------------------------------- Lab Results  Component Value Date   CHOL 109 10/04/2021   HDL 48.00 10/04/2021   LDLCALC 39 10/04/2021   LDLDIRECT 42.0 09/12/2020   TRIG 110.0 10/04/2021   CHOLHDL 2 10/04/2021    Lab Results  Component Value Date   HGBA1C 7.3 (H) 02/12/2024   Recent Labs    02/15/24 0728  TSH 2.863  FREET4 0.92   No results for input(s): VITAMINB12, FOLATE, FERRITIN, TIBC, IRON, RETICCTPCT in the last 72  hours. ------------------------------------------------------------------------------------------------------------------ Cardiac Enzymes No results for input(s): CKMB, TROPONINI, MYOGLOBIN in the last 168 hours.  Invalid input(s): CK  Micro Results Recent Results (from the past 240 hours)  Blood Culture (routine x 2)     Status: None   Collection Time: 02/12/24 10:38 AM   Specimen: BLOOD RIGHT FOREARM  Result Value Ref Range Status   Specimen Description BLOOD RIGHT FOREARM  Final   Special Requests   Final    BOTTLES DRAWN AEROBIC AND ANAEROBIC Blood Culture results may not be optimal due to an inadequate volume of blood received in culture bottles  Culture   Final    NO GROWTH 5 DAYS Performed at [facility], [address], [place], [place] [address]    Report Status 02/17/2024 FINAL  Final  Blood Culture (routine x 2)     Status: None   Collection Time: 02/12/24 10:45 AM   Specimen: BLOOD  Result Value Ref Range Status   Specimen Description BLOOD SITE NOT SPECIFIED  Final   Special Requests   Final    BOTTLES DRAWN AEROBIC AND ANAEROBIC Blood Culture results may not be optimal due to an inadequate volume of blood received in culture bottles   Culture   Final    NO GROWTH 5 DAYS Performed at [facility], [address], [place], [place] [address]    Report Status 02/17/2024 FINAL  Final  Resp panel by RT-PCR (RSV, Flu A&B, Covid) Anterior Nasal Swab     Status: None   Collection Time: 02/12/24 11:06 AM   Specimen: Anterior Nasal Swab  Result Value Ref Range Status   SARS Coronavirus 2 by RT PCR NEGATIVE NEGATIVE Final   Influenza A by PCR NEGATIVE NEGATIVE Final   Influenza B by PCR NEGATIVE NEGATIVE Final    Comment: (NOTE) The Xpert Xpress SARS-CoV-2/FLU/RSV plus assay is intended as an aid in the diagnosis of influenza from Nasopharyngeal swab specimens and should not be used as a sole basis for treatment. Nasal washings and aspirates are  unacceptable for Xpert Xpress SARS-CoV-2/FLU/RSV testing.  Fact Sheet for Patients: BloggerCourse.com  Fact Sheet for Healthcare Providers: SeriousBroker.it  This test is not yet approved or cleared by the [place]  FDA and has been authorized for detection and/or diagnosis of SARS-CoV-2 by FDA under an Emergency Use Authorization (EUA). This EUA will remain in effect (meaning this test can be used) for the duration of the COVID-19 declaration under Section 564(b)(1) of the Act, 21 U.S.C. section 360bbb-3(b)(1), unless the authorization is terminated or revoked.     Resp Syncytial Virus by PCR NEGATIVE NEGATIVE Final    Comment: (NOTE) Fact Sheet for Patients: BloggerCourse.com  Fact Sheet for Healthcare Providers: SeriousBroker.it  This test is not yet approved or cleared by the [place]  FDA and has been authorized for detection and/or diagnosis of SARS-CoV-2 by FDA under an Emergency Use Authorization (EUA). This EUA will remain in effect (meaning this test can be used) for the duration of the COVID-19 declaration under Section 564(b)(1) of the Act, 21 U.S.C. section 360bbb-3(b)(1), unless the authorization is terminated or revoked.  Performed at [facility], [address], [place], [place] [address]   Urine Culture     Status: Abnormal   Collection Time: 02/12/24 11:49 AM   Specimen: Urine, Catheterized  Result Value Ref Range Status   Specimen Description URINE, CATHETERIZED  Final   Special Requests   Final    NONE Reflexed from [id number] Performed at [facility], [address] [place], [place] [address]    Culture MULTIPLE SPECIES PRESENT, SUGGEST RECOLLECTION (A)  Final   Report Status 02/13/2024 FINAL  Final    Radiology Reports  No results found.    Signature  -   Lynnwood Sauer M.D on 02/17/2024 at 10:11 AM   -  To page go to  [email or web address]

## 2024-02-17 NOTE — Plan of Care (Signed)

## 2024-02-17 NOTE — Plan of Care (Signed)

## 2024-02-17 NOTE — Progress Notes (Signed)
   02/17/24 0910  Mobility  Activity Ambulated with assistance in hallway  Level of Assistance Contact guard assist, steadying assist  Assistive Device Front wheel walker  Distance Ambulated (ft) 80 ft  Activity Response Tolerated fair  Mobility Referral Yes  Mobility visit 1 Mobility  Mobility Specialist Start Time (ACUTE ONLY) 0910  Mobility Specialist Stop Time (ACUTE ONLY) 0933  Mobility Specialist Time Calculation (min) (ACUTE ONLY) 23 min   Mobility Specialist: Progress Note  Pre-Mobility:      HR 88,                    BP 141/62 (83) Post-Mobility:    HR 81, SpO2 97%, BP 145/65 (85)  Pt agreeable to mobility session - received in chair. Pt was asymptomatic throughout session with no complaints. Returned to chair with all needs met - call bell within reach. Chair alarm on.   [name], BS Mobility Specialist Please contact via SecureChat or  Rehab office at [phone].

## 2024-02-18 DIAGNOSIS — R42 Dizziness and giddiness: Secondary | ICD-10-CM | POA: Diagnosis not present

## 2024-02-18 LAB — BASIC METABOLIC PANEL WITH GFR
Anion gap: 8 (ref 5–15)
BUN: 10 mg/dL (ref 8–23)
CO2: 27 mmol/L (ref 22–32)
Calcium: 8 mg/dL — ABNORMAL LOW (ref 8.9–10.3)
Chloride: 102 mmol/L (ref 98–111)
Creatinine, Ser: 0.59 mg/dL (ref 0.44–1.00)
GFR, Estimated: >60 mL/min (ref >60)
Glucose, Bld: 160 mg/dL — ABNORMAL HIGH (ref 70–99)
Potassium: 4.5 mmol/L (ref 3.5–5.1)
Sodium: 137 mmol/L (ref 135–145)

## 2024-02-18 LAB — GLUCOSE, CAPILLARY
Glucose-Capillary: 139 mg/dL — ABNORMAL HIGH (ref 70–99)
Glucose-Capillary: 154 mg/dL — ABNORMAL HIGH (ref 70–99)
Glucose-Capillary: 168 mg/dL — ABNORMAL HIGH (ref 70–99)
Glucose-Capillary: 181 mg/dL — ABNORMAL HIGH (ref 70–99)
Glucose-Capillary: 226 mg/dL — ABNORMAL HIGH (ref 70–99)
Glucose-Capillary: 265 mg/dL — ABNORMAL HIGH (ref 70–99)

## 2024-02-18 LAB — BRAIN NATRIURETIC PEPTIDE: B Natriuretic Peptide: 79.4 pg/mL (ref 0.0–100.0)

## 2024-02-18 LAB — CBC WITH DIFFERENTIAL/PLATELET
Abs Immature Granulocytes: 0.06 10*3/uL (ref 0.00–0.07)
Basophils Absolute: 0.1 10*3/uL (ref 0.0–0.1)
Basophils Relative: 1 %
Eosinophils Absolute: 0.3 10*3/uL (ref 0.0–0.5)
Eosinophils Relative: 3 %
HCT: 34 % — ABNORMAL LOW (ref 36.0–46.0)
Hemoglobin: 11.4 g/dL — ABNORMAL LOW (ref 12.0–15.0)
Immature Granulocytes: 1 %
Lymphocytes Relative: 18 %
Lymphs Abs: 1.7 10*3/uL (ref 0.7–4.0)
MCH: 31.2 pg (ref 26.0–34.0)
MCHC: 33.5 g/dL (ref 30.0–36.0)
MCV: 93.2 fL (ref 80.0–100.0)
Monocytes Absolute: 1.1 10*3/uL — ABNORMAL HIGH (ref 0.1–1.0)
Monocytes Relative: 12 %
Neutro Abs: 6.5 10*3/uL (ref 1.7–7.7)
Neutrophils Relative %: 65 %
Platelets: 412 10*3/uL — ABNORMAL HIGH (ref 150–400)
RBC: 3.65 MIL/uL — ABNORMAL LOW (ref 3.87–5.11)
RDW: 12.7 % (ref 11.5–15.5)
WBC: 9.8 10*3/uL (ref 4.0–10.5)
nRBC: 0 % (ref 0.0–0.2)

## 2024-02-18 LAB — MAGNESIUM: Magnesium: 1.7 mg/dL (ref 1.7–2.4)

## 2024-02-18 LAB — PHOSPHORUS: Phosphorus: 3.5 mg/dL (ref 2.5–4.6)

## 2024-02-18 MED ORDER — CEFADROXIL 500 MG PO CAPS
500.0000 mg | ORAL_CAPSULE | Freq: Two times a day (BID) | ORAL | Status: DC
  Administered 2024-02-18 – 2024-02-19 (×3): 500 mg via ORAL
  Filled 2024-02-18 (×5): qty 1

## 2024-02-18 MED ORDER — METOPROLOL TARTRATE 50 MG PO TABS
50.0000 mg | ORAL_TABLET | Freq: Once | ORAL | Status: AC
  Administered 2024-02-18: 50 mg via ORAL
  Filled 2024-02-18: qty 1

## 2024-02-18 MED ORDER — METOPROLOL TARTRATE 100 MG PO TABS
100.0000 mg | ORAL_TABLET | Freq: Two times a day (BID) | ORAL | Status: DC
  Administered 2024-02-18 – 2024-02-19 (×2): 100 mg via ORAL
  Filled 2024-02-18 (×2): qty 1

## 2024-02-18 NOTE — Plan of Care (Signed)

## 2024-02-18 NOTE — TOC Progression Note (Addendum)
 Transition of Care [facility]) - Progression Note    Patient Details  Name: [name] [name] MRN: [id number] Date of Birth: 12-15-1940  [facility] CM/SW Contact  Jannice Mends, LCSW Phone Number: 02/18/2024, 8:32 AM  Clinical Narrative:    8:32 AM-Insurance authorization still pending for SNF. CSW provided update to daughter.   3:06 PM-Insurance approval received, Ref# [id number], effective 02/18/2024-02/20/2024. CSW updated patient's daughter and she requested PTAR for transport.    Expected Discharge Plan: Skilled Nursing Facility Barriers to Discharge: Continued Medical Work up, English as a second language teacher, SNF Pending bed offer  Expected Discharge Plan and Services In-house Referral: Clinical Social Work   Post Acute Care Choice: Skilled Nursing Facility Living arrangements for the past 2 months: Single Family Home                                       Social Determinants of Health (SDOH) Interventions SDOH Screenings   Food Insecurity: No Food Insecurity (02/13/2023)  Housing: Low Risk  (02/13/2023)  Transportation Needs: No Transportation Needs (02/13/2023)  Utilities: Not At Risk (02/13/2023)  Alcohol  Screen: Low Risk  (11/03/2021)  Depression (PHQ2-9): Low Risk  (05/18/2022)  Financial Resource Strain: Low Risk  (11/03/2021)  Physical Activity: Sufficiently Active (11/03/2021)  Social Connections: Unknown (05/11/2022)   Received from [facility]  Stress: No Stress Concern Present (11/03/2021)  Tobacco Use: Low Risk  (02/12/2024)    Readmission Risk Interventions     No data to display

## 2024-02-18 NOTE — Progress Notes (Signed)
 PO4 came back 3.5 this AM. Ok to stop the last dose of kphos this AM.   Ivery Marking, PharmD, BCIDP, AAHIVP, CPP Infectious Disease Pharmacist 02/18/2024 8:00 AM

## 2024-02-18 NOTE — Progress Notes (Addendum)
   02/18/24 0908  Mobility  Activity Ambulated with assistance in hallway  Level of Assistance Contact guard assist, steadying assist  Assistive Device Front wheel walker  Distance Ambulated (ft) 150 ft  Activity Response Tolerated well  Mobility Referral Yes  Mobility visit 1 Mobility  Mobility Specialist Start Time (ACUTE ONLY) 0908  Mobility Specialist Stop Time (ACUTE ONLY) 0929  Mobility Specialist Time Calculation (min) (ACUTE ONLY) 21 min   Mobility Specialist: Progress Note  Pre-Mobility:      HR 97, SpO2 97% RA During Mobility:                                      BP 163/64 Post-Mobility:    HR 86 , SpO2 94% RA, BP 151/57  Pt agreeable to mobility session - received in chair. Pt was asymptomatic throughout session with no complaints. Pericare with assistance.  Returned to chair with all needs met - call bell within reach. Chair alarm on. Daughter present.    [name], BS Mobility Specialist Please contact via SecureChat or  Rehab office at [phone].

## 2024-02-18 NOTE — Plan of Care (Signed)
 Pt has rested quietly throughout the night with no distress noted. Alert and oriented. On room air. SR on the monitor. Up to BR to void with standby assist and walker. No complaints voiced.     Problem: Education: Goal: Knowledge of General Education information will improve Description: Including pain rating scale, medication(s)/side effects and non-pharmacologic comfort measures Outcome: Progressing   Problem: Clinical Measurements: Goal: Respiratory complications will improve Outcome: Progressing Goal: Cardiovascular complication will be avoided Outcome: Progressing   Problem: Pain Managment: Goal: General experience of comfort will improve and/or be controlled Outcome: Progressing

## 2024-02-18 NOTE — Progress Notes (Addendum)
 PROGRESS NOTE        PATIENT DETAILS Name: [name] [name] Age: 83 y.o. Sex: female Date of Birth: 08-30-1941 Admit Date: 02/12/2024 Admitting Physician Lavanda Porter, MD [id number], Cody Das, NP  Brief Summary: Patient is a 83 y.o.  female with history of CAD-multiple PCI's-last 2022, bacterial endocarditis, HTN, HLD, DM-2 who presented to the ED with weakness, confusion, headache, dizziness-upon further evaluation-she was found to have sepsis physiology secondary to complicated UTI.  Significant events: 6/11>> admit to [facility]  Significant studies: 6/11>> CT head: No acute intracranial abnormality 6/11>> CTA chest: No PE, unchanged left lung nodule. 6/11>> CT abdomen/pelvis: Left perinephric stranding-secondary to past calculi or ascending UTI. 6/12 echocardiogram - EF 60%, mild pericardial effusion, no major valvular abnormality  Significant microbiology data: 6/11>> COVID/influenza/RSV PCR: Negative 6/11>> urine culture: Pending 6/11>> blood culture: No growth  Procedures: None  Consults: Cardiology  Subjective: Patient in bed, appears comfortable, denies any headache, no fever, no chest pain or pressure, no shortness of breath , no abdominal pain. No new focal weakness.  Objective: Vitals: Blood pressure (!) 162/70, pulse 80, temperature 98.2 F (36.8 C), temperature source Oral, resp. rate 18, height 5' (1.524 m), weight 56.2 kg, SpO2 91%.   Exam:  Awake Alert, No new F.N deficits, Normal affect National.AT,PERRAL Supple Neck, No JVD,   Symmetrical Chest wall movement, Good air movement bilaterally, CTAB iRRR,No Gallops, Rubs or new Murmurs,  +ve B.Sounds, Abd Soft, No tenderness,   No Cyanosis, Clubbing or edema    Assessment/Plan:  Sepsis secondary to complicated UTI/nephritis (POA) Low-grade fever in the afternoon on 6/12 Blood cultures negative so far Urine culture growing multiple species, suggested recollection Since blood cultures  negative-stop vancomycin -do not think she needs anaerobic coverage-stop Flagyl . Continue cefepime  for now, de-escalate to oral antibiotics on discharge to complete total 7-day course  Hypokalemia, and hypophosphatemia, repleted with Neutra-Phos. Monitor electrolytes and replete as needed Magnesium  1.8, Monitor   New onset A-fib RVR, likely paroxysmal.  [place] vas 2 score of greater than 4.  Went into A-fib 8:52 AM 02/15/2024, quired Cardizem  drip, converted to sinus rhythm evening of 02/15/2024, rate better, Cardizem  drip stopped, now on increased than home dose beta-blocker orally, dose increased further on 02/18/2024 along with Eliquis. Recent echo noted with preserved EF 60%, stable TSH and free T4.  Discussed with cardiology appreciate their input, now on Eliquis and Plavix , aspirin  discontinued.    Chronic HFpEF Relatively euvolemic Continue beta-blocker Echo LVEF 60%, mild pericardial effusion, no tamponade, no any other significant findings  CAD-history of multiple PCI's-last in 2022. No anginal symptoms Continue DAPT along with statin/metoprolol  dose increased on 02/15/2024,    HTN BP stable Continue beta-blocker, dose increased for better heart rate control and blood pressure control on 02/18/2024.  Dizziness, resolved Check orthostatics Mobilize with PT/OT CT head negative.  Acute metabolic encephalopathy Likely secondary to sepsis/UTI. Lethargic/confused on initial presentation-change from prior baseline Seems to be slowly improving-more alert-still pretty weak-continue to treat underlying UTI/sepsis physiology. 6/13 she is back to her baseline, AME resolved    Reported remote history of bacterial endocarditis (approximately 4-5 years ago when she was living in [place] ) Thankfully blood cultures negative so far Follow blood [name] until final.  CKD stage IIIb Creatinine close to baseline. Trend electrolytes.  GERD PPI  Left reverse shoulder arthroplasty planned with  [facility] Recently evaluated  by cardiology-felt to be acceptable risk to proceed with surgery.  Left lung nodule: Seen incidentally Unchanged 1.4 x 1.9 cm nodule-from prior studies per radiology report Repeat chest in 3 to 6 months recommended by radiology  Debility/deconditioning Secondary to acute illness/sepsis physiology PT/OT eval.  Nighttime apneic episodes.  Likely has undiagnosed sleep apnea, outpatient sleep study, nighttime oxygen if qualifies for now.   DM-2 (A1c 7.3 on 6/11) SSI while inpatient Managed with diet at home  Recent Labs    02/17/24 1514 02/17/24 2120 02/18/24 0741  GLUCAP 165* 172* 168*        Code status:   Code Status: Limited: Do not attempt resuscitation (DNR) -DNR-LIMITED -Do Not Intubate/DNI    DVT Prophylaxis: apixaban (ELIQUIS) tablet 2.5 mg Start: 02/15/24 2200 apixaban (ELIQUIS) tablet 2.5 mg   Family Communication: grand daughter bedside on 02/15/2024.  Daughter bedside 02/17/2024, 02/18/2024   Disposition Plan: Status is: Inpatient Remains inpatient appropriate because: Severity of illness   Planned Discharge Destination: SNF   Diet: Diet Order             Diet Heart Room service appropriate? Yes with Assist; Fluid consistency: Thin  Diet effective now                     Data Review:   Inpatient Medications  Scheduled Meds:  apixaban  2.5 mg Oral BID   atorvastatin   80 mg Oral Daily   Chlorhexidine  Gluconate Cloth  6 each Topical Daily   clopidogrel   75 mg Oral q morning   ezetimibe   10 mg Oral q morning   feeding supplement  1 Container Oral TID BM   insulin  aspart  0-9 Units Subcutaneous TID WC   metoprolol  tartrate  50 mg Oral BID   multivitamin with minerals  1 tablet Oral Daily   pantoprazole   40 mg Oral Q1200   tamsulosin  0.4 mg Oral Daily   venlafaxine  XR  37.5 mg Oral q morning   Continuous Infusions:  ceFEPime  (MAXIPIME ) IV 2 g (02/18/24 0340)   PRN Meds:.acetaminophen  **OR** acetaminophen ,  diltiazem , ondansetron  (ZOFRAN ) IV, prochlorperazine   DVT Prophylaxis  apixaban (ELIQUIS) tablet 2.5 mg Start: 02/15/24 2200 apixaban (ELIQUIS) tablet 2.5 mg    Recent Labs  Lab 02/12/24 1039 02/12/24 1055 02/14/24 0721 02/15/24 0728 02/16/24 0759 02/17/24 0541 02/18/24 0457  WBC 12.6*   < > 6.2 6.4 7.5 9.0 9.8  HGB 13.9   < > 12.6 11.9* 11.7* 11.7* 11.4*  HCT 44.1   < > 38.5 35.5* 35.3* 35.3* 34.0*  PLT 233   < > 186 226 261 320 412*  MCV 98.4   < > 93.7 91.5 93.6 93.1 93.2  MCH 31.0   < > 30.7 30.7 31.0 30.9 31.2  MCHC 31.5   < > 32.7 33.5 33.1 33.1 33.5  RDW 13.2   < > 12.8 12.9 12.9 12.8 12.7  LYMPHSABS 0.8  --   --  1.1 1.5 1.7 1.7  MONOABS 0.8  --   --  1.2* 0.9 1.0 1.1*  EOSABS 0.0  --   --  0.1 0.2 0.3 0.3  BASOSABS 0.1  --   --  0.1 0.1 0.1 0.1   < > = values in this interval not displayed.    Recent Labs  Lab 02/12/24 1039 02/12/24 1055 02/12/24 1056 02/12/24 1359 02/12/24 1607 02/12/24 2118 02/13/24 0758 02/14/24 0721 02/14/24 1042 02/15/24 0728 02/16/24 0759 02/17/24 0541 02/18/24 0457  NA 131*   < >  --   --   --   --  131* 134*  --  136 137 136 137  K 4.4   < >  --   --   --   --  3.6 3.3*  --  3.3* 3.6 3.7 4.5  CL 97*   < >  --   --   --   --  96* 96*  --  96* 100 102 102  CO2 22  --   --   --   --   --  25 26  --  30 28 25 27   ANIONGAP 12  --   --   --   --   --  10 12  --  10 9 9 8   GLUCOSE 330*   < >  --   --   --   --  184* 160*  --  173* 168* 170* 160*  BUN 25*   < >  --   --   --   --  13 13  --  13 9 13 10   CREATININE 1.30*   < >  --   --   --   --  0.81 0.82  --  0.68 0.65 0.71 0.59  AST 22  --   --   --   --   --  21  --   --   --   --   --   --   ALT 17  --   --   --   --   --  17  --   --   --   --   --   --   ALKPHOS 72  --   --   --   --   --  53  --   --   --   --   --   --   BILITOT 0.8  --   --   --   --   --  0.8  --   --   --   --   --   --   ALBUMIN 3.0*  --   --   --   --   --  2.4*  --   --   --   --   --   --   CRP  --    --   --   --  13.3*  --   --   --   --   --   --   --   --   PROCALCITON 25.08  --   --   --   --   --  12.10  --   --   --   --   --   --   LATICACIDVEN  --   --  2.0* 4.0*  --  1.4  --   --   --   --   --   --   --   INR 1.2  --   --   --   --   --   --   --   --   --   --   --   --   TSH  --   --   --   --   --   --   --   --   --  2.863  --   --   --   HGBA1C 7.3*  --   --   --   --   --   --   --   --   --   --   --   --  BNP  --   --   --   --   --  164.4*  --   --   --  86.3 175.5* 82.9 79.4  MG  --   --   --   --   --   --   --   --  1.8 1.9 1.9 1.9 1.7  PHOS  --   --   --   --   --   --   --   --  2.3* 3.7 2.7 2.0* 3.5  CALCIUM  8.4*  --   --   --   --   --  7.8* 8.2*  --  8.0* 8.4* 8.0* 8.0*   < > = values in this interval not displayed.      Recent Labs  Lab 02/12/24 1039 02/12/24 1056 02/12/24 1359 02/12/24 1607 02/12/24 2118 02/13/24 0758 02/14/24 0721 02/14/24 1042 02/15/24 0728 02/16/24 0759 02/17/24 0541 02/18/24 0457  CRP  --   --   --  13.3*  --   --   --   --   --   --   --   --   PROCALCITON 25.08  --   --   --   --  12.10  --   --   --   --   --   --   LATICACIDVEN  --  2.0* 4.0*  --  1.4  --   --   --   --   --   --   --   INR 1.2  --   --   --   --   --   --   --   --   --   --   --   TSH  --   --   --   --   --   --   --   --  2.863  --   --   --   HGBA1C 7.3*  --   --   --   --   --   --   --   --   --   --   --   BNP  --   --   --   --  164.4*  --   --   --  86.3 175.5* 82.9 79.4  MG  --   --   --   --   --   --   --  1.8 1.9 1.9 1.9 1.7  CALCIUM  8.4*  --   --   --   --  7.8* 8.2*  --  8.0* 8.4* 8.0* 8.0*    --------------------------------------------------------------------------------------------------------------- Lab Results  Component Value Date   CHOL 109 10/04/2021   HDL 48.00 10/04/2021   LDLCALC 39 10/04/2021   LDLDIRECT 42.0 09/12/2020   TRIG 110.0 10/04/2021   CHOLHDL 2 10/04/2021    Lab Results  Component Value Date   HGBA1C  7.3 (H) 02/12/2024   No results for input(s): TSH, T4TOTAL, FREET4, T3FREE, THYROIDAB in the last 72 hours.  No results for input(s): VITAMINB12, FOLATE, FERRITIN, TIBC, IRON, RETICCTPCT in the last 72 hours. ------------------------------------------------------------------------------------------------------------------ Cardiac Enzymes No results for input(s): CKMB, TROPONINI, MYOGLOBIN in the last 168 hours.  Invalid input(s): CK  Micro Results Recent Results (from the past 240 hours)  Blood Culture (routine x 2)     Status: None   Collection Time: 02/12/24 10:38 AM   Specimen: BLOOD RIGHT FOREARM  Result Value Ref Range  Status   Specimen Description BLOOD RIGHT FOREARM  Final   Special Requests   Final    BOTTLES DRAWN AEROBIC AND ANAEROBIC Blood Culture results may not be optimal due to an inadequate volume of blood received in culture bottles   Culture   Final    NO GROWTH 5 DAYS Performed at [facility], [address], [place], [place] [address]    Report Status 02/17/2024 FINAL  Final  Blood Culture (routine x 2)     Status: None   Collection Time: 02/12/24 10:45 AM   Specimen: BLOOD  Result Value Ref Range Status   Specimen Description BLOOD SITE NOT SPECIFIED  Final   Special Requests   Final    BOTTLES DRAWN AEROBIC AND ANAEROBIC Blood Culture results may not be optimal due to an inadequate volume of blood received in culture bottles   Culture   Final    NO GROWTH 5 DAYS Performed at [facility], [address], [place], [place] [address]    Report Status 02/17/2024 FINAL  Final  Resp panel by RT-PCR (RSV, Flu A&B, Covid) Anterior Nasal Swab     Status: None   Collection Time: 02/12/24 11:06 AM   Specimen: Anterior Nasal Swab  Result Value Ref Range Status   SARS Coronavirus 2 by RT PCR NEGATIVE NEGATIVE Final   Influenza A by PCR NEGATIVE NEGATIVE Final   Influenza B by PCR NEGATIVE NEGATIVE Final    Comment:  (NOTE) The Xpert Xpress SARS-CoV-2/FLU/RSV plus assay is intended as an aid in the diagnosis of influenza from Nasopharyngeal swab specimens and should not be used as a sole basis for treatment. Nasal washings and aspirates are unacceptable for Xpert Xpress SARS-CoV-2/FLU/RSV testing.  Fact Sheet for Patients: BloggerCourse.com  Fact Sheet for Healthcare Providers: SeriousBroker.it  This test is not yet approved or cleared by the [place]  FDA and has been authorized for detection and/or diagnosis of SARS-CoV-2 by FDA under an Emergency Use Authorization (EUA). This EUA will remain in effect (meaning this test can be used) for the duration of the COVID-19 declaration under Section 564(b)(1) of the Act, 21 U.S.C. section 360bbb-3(b)(1), unless the authorization is terminated or revoked.     Resp Syncytial Virus by PCR NEGATIVE NEGATIVE Final    Comment: (NOTE) Fact Sheet for Patients: BloggerCourse.com  Fact Sheet for Healthcare Providers: SeriousBroker.it  This test is not yet approved or cleared by the [place]  FDA and has been authorized for detection and/or diagnosis of SARS-CoV-2 by FDA under an Emergency Use Authorization (EUA). This EUA will remain in effect (meaning this test can be used) for the duration of the COVID-19 declaration under Section 564(b)(1) of the Act, 21 U.S.C. section 360bbb-3(b)(1), unless the authorization is terminated or revoked.  Performed at [facility], [address] [place], [place] [address]   Urine Culture     Status: Abnormal   Collection Time: 02/12/24 11:49 AM   Specimen: Urine, Catheterized  Result Value Ref Range Status   Specimen Description URINE, CATHETERIZED  Final   Special Requests   Final    NONE Reflexed from [id number] Performed at [facility], [address], [place], [place] [address]    Culture  MULTIPLE SPECIES PRESENT, SUGGEST RECOLLECTION (A)  Final   Report Status 02/13/2024 FINAL  Final    Radiology Reports  No results found.    Signature  -   Lynnwood Sauer M.D on 02/18/2024 at 10:17 AM   -  To page go to [email or web address]

## 2024-02-18 NOTE — Evaluation (Signed)
 Occupational Therapy Evaluation Patient Details Name: [name] [name] MRN: [id number] DOB: 1941/04/28 Today's Date: 02/18/2024   History of Present Illness   Patient is 83 y.o. female who presented to the ED with weakness, confusion, headache, dizziness. In ED workup revealed sepsis physiology secondary to complicated UTI. PMH significant for DMII, HTN, HLD, chronic diastolic heart failure, CAD, chronic hyponatremia, hxo f Stroke,     Clinical Impressions Pt completed seated grooming with set up, UB bathing with min assist, UB dressing with set up, LB bathing and dressing with moderate assistance. Stands and completes pericare with CGA for balance and moderate assistance for thoroughness. Problem solved with pt and daughter about how pt my access her high bed safely once she returns home to her niece's house. Patient will benefit from continued inpatient follow up therapy, <3 hours/day.     If plan is discharge home, recommend the following:   A little help with walking and/or transfers;Assistance with cooking/housework;Direct supervision/assist for medications management;Direct supervision/assist for financial management;Assist for transportation;A lot of help with bathing/dressing/bathroom     Functional Status Assessment         Equipment Recommendations   Tub/shower seat     Recommendations for Other Services         Precautions/Restrictions   Precautions Precautions: Fall Recall of Precautions/Restrictions: Intact Precaution/Restrictions Comments: hx of falls Restrictions Weight Bearing Restrictions Per Provider Order: No     Mobility Bed Mobility               General bed mobility comments: in chair    Transfers Overall transfer level: Needs assistance Equipment used: Rolling walker (2 wheels) Transfers: Sit to/from Stand Sit to Stand: Contact guard assist           General transfer comment: cues for hand placement      Balance Overall  balance assessment: Needs assistance   Sitting balance-Leahy Scale: Fair     Standing balance support: Single extremity supported Standing balance-Leahy Scale: Fair                             ADL either performed or assessed with clinical judgement   ADL Overall ADL's : Needs assistance/impaired Eating/Feeding: Set up;Sitting   Grooming: Wash/dry hands;Wash/dry face;Sitting;Set up   Upper Body Bathing: Minimal assistance;Sitting   Lower Body Bathing: Moderate assistance;Sit to/from stand   Upper Body Dressing : Set up;Sitting   Lower Body Dressing: Minimal assistance;Sit to/from stand       Toileting- Architect and Hygiene: Moderate assistance;Sit to/from stand Toileting - Clothing Manipulation Details (indicate cue type and reason): assist for [employer]         Perception         Praxis         Pertinent Vitals/Pain Pain Assessment Pain Assessment: Faces Faces Pain Scale: Hurts little more Pain Location: L shoulder Pain Descriptors / Indicators: Aching Pain Intervention(s): Monitored during session, Repositioned     Extremity/Trunk Assessment             Communication Communication Communication: Impaired Factors Affecting Communication: Hearing impaired   Cognition Arousal: Alert Behavior During Therapy: WFL for tasks assessed/performed Cognition: Cognition impaired     Awareness: Intellectual awareness intact Memory impairment (select all impairments): Short-term memory  Following commands: Intact       Cueing  General Comments   Cueing Techniques: Verbal cues      Exercises     Shoulder Instructions      Home Living                                          Prior Functioning/Environment                      OT Problem List:     OT Treatment/Interventions:        OT Goals(Current goals can be found in the  care plan section)   Acute Rehab OT Goals OT Goal Formulation: With patient/family Time For Goal Achievement: 02/28/24 Potential to Achieve Goals: Good   OT Frequency:  Min 2X/week    Co-evaluation              AM-PAC OT 6 Clicks Daily Activity     Outcome Measure Help from another person eating meals?: None Help from another person taking care of personal grooming?: A Little Help from another person toileting, which includes using toliet, bedpan, or urinal?: A Lot Help from another person bathing (including washing, rinsing, drying)?: A Lot Help from another person to put on and taking off regular upper body clothing?: A Little Help from another person to put on and taking off regular lower body clothing?: A Lot 6 Click Score: 16   End of Session Equipment Utilized During Treatment: Rolling walker (2 wheels);Gait belt  Activity Tolerance: Patient tolerated treatment well Patient left: in chair;with call bell/phone within reach;with family/visitor present;with chair alarm set  OT Visit Diagnosis: Unsteadiness on feet (R26.81);Other abnormalities of gait and mobility (R26.89);Muscle weakness (generalized) (M62.81)                Time: 0981-1914 OT Time Calculation (min): 41 min Charges:  OT General Charges $OT Visit: 1 Visit OT Treatments $Self Care/Home Management : 38-52 mins  Avanell Leigh, OTR/L Acute Rehabilitation Services Office: [phone]   Jonette Nestle 02/18/2024, 12:06 PM

## 2024-02-18 NOTE — Progress Notes (Signed)
 Ok to change cefepime  to cefadroxil 500mg  PO BID x 4 more doses to complete 7 days per Dr. Zelda Hickman.  Ivery Marking, PharmD, BCIDP, AAHIVP, CPP Infectious Disease Pharmacist 02/18/2024 1:59 PM

## 2024-02-19 DIAGNOSIS — R42 Dizziness and giddiness: Secondary | ICD-10-CM | POA: Diagnosis not present

## 2024-02-19 LAB — GLUCOSE, CAPILLARY
Glucose-Capillary: 170 mg/dL — ABNORMAL HIGH (ref 70–99)
Glucose-Capillary: 170 mg/dL — ABNORMAL HIGH (ref 70–99)
Glucose-Capillary: 197 mg/dL — ABNORMAL HIGH (ref 70–99)

## 2024-02-19 MED ORDER — CEFADROXIL 500 MG PO CAPS: 500.0000 mg | ORAL_CAPSULE | Freq: Two times a day (BID) | ORAL | Status: DC

## 2024-02-19 MED ORDER — APIXABAN 2.5 MG PO TABS: 2.5000 mg | ORAL_TABLET | Freq: Two times a day (BID) | ORAL | Status: DC

## 2024-02-19 MED ORDER — METOPROLOL TARTRATE 100 MG PO TABS: 100.0000 mg | ORAL_TABLET | Freq: Two times a day (BID) | ORAL | Status: DC

## 2024-02-19 MED ORDER — INSULIN ASPART 100 UNIT/ML FLEXPEN: PEN_INJECTOR | SUBCUTANEOUS | Status: DC

## 2024-02-19 NOTE — Plan of Care (Signed)
 Pt has rested quietly throughout the night with no distress noted. Alert and oriented. On O22LNC to keep sats >92% at 0200. SR on the monitor. Up to [facility] to void. No complaints voiced.     Problem: Education: Goal: Knowledge of General Education information will improve Description: Including pain rating scale, medication(s)/side effects and non-pharmacologic comfort measures Outcome: Progressing   Problem: Health Behavior/Discharge Planning: Goal: Ability to manage health-related needs will improve Outcome: Progressing   Problem: Clinical Measurements: Goal: Respiratory complications will improve Outcome: Progressing Goal: Cardiovascular complication will be avoided Outcome: Progressing   Problem: Activity: Goal: Risk for activity intolerance will decrease Outcome: Progressing   Problem: Pain Managment: Goal: General experience of comfort will improve and/or be controlled Outcome: Progressing

## 2024-02-19 NOTE — Progress Notes (Signed)
 Report given to Debria Fang at [facility].

## 2024-02-19 NOTE — TOC Transition Note (Signed)
 Transition of Care [facility]) - Discharge Note   Patient Details  Name: [name] [name] MRN: [id number] Date of Birth: Dec 08, 1940  [facility] of [facility]) CM/SW Contact:  Jannice Mends, LCSW Phone Number: 02/19/2024, 11:55 AM   Clinical Narrative:    Patient will DC to: [facility] Anticipated DC date: 02/19/24 Family notified: Daughter, Proofreader by: Lyna Sandhoff   Per MD patient ready for DC to [facility]. RN to call report prior to discharge [phone] room [phone]). RN, patient, patient's family, and facility notified of DC. Discharge Summary and FL2 sent to facility. DC packet on chart including signed DNR. Ambulance transport requested for patient.   CSW will sign off for now as social work intervention is no longer needed. Please consult us  again if new needs arise.     Final next level of care: Skilled Nursing Facility Barriers to Discharge: Barriers Resolved   Patient Goals and CMS Choice Patient states their goals for this hospitalization and ongoing recovery are:: Rehab CMS Medicare.gov Compare Post Acute Care list provided to:: Patient Represenative (must comment) Choice offered to / list presented to : Adult Children [place] ownership interest in [facility].provided to:: Adult Children    Discharge Placement   Existing PASRR number confirmed : 02/19/24          Patient chooses bed at: [facility] Patient to be transferred to facility by: [facility] Name of family member notified: Daughter Patient and family notified of of transfer: 02/19/24  Discharge Plan and Services Additional resources added to the After Visit Summary for   In-house Referral: Clinical Social Work   Post Acute Care Choice: Skilled Nursing Facility                               Social Drivers of Health (SDOH) Interventions SDOH Screenings   Food Insecurity: No Food Insecurity (02/13/2023)  Housing: Low Risk  (02/13/2023)  Transportation Needs: No Transportation  Needs (02/13/2023)  Utilities: Not At Risk (02/13/2023)  Alcohol  Screen: Low Risk  (11/03/2021)  Depression (PHQ2-9): Low Risk  (05/18/2022)  Financial Resource Strain: Low Risk  (11/03/2021)  Physical Activity: Sufficiently Active (11/03/2021)  Social Connections: Unknown (05/11/2022)   Received from [facility]  Stress: No Stress Concern Present (11/03/2021)  Tobacco Use: Low Risk  (02/12/2024)     Readmission Risk Interventions     No data to display

## 2024-02-19 NOTE — Discharge Summary (Addendum)
 [name] [name]:[id number] DOB: 20-May-1941 DOA: 02/12/2024  PCP: Vevelyn Gowers, NP  Admit date: 02/12/2024  Discharge date: 02/19/2024  Admitted From: Home   Disposition:  SNF   Recommendations for Outpatient Follow-up:   Follow up with PCP in 1-2 weeks  PCP Please obtain BMP/CBC, 2 view CXR in 1week,  (see Discharge instructions)   PCP Please follow up on the following pending results:    Home Health: None   Equipment/Devices: None  Consultations: Cards Discharge Condition: Stable    CODE STATUS: Full    Diet Recommendation: Heart Healthy Low Carb - check CBGs QAC-HS    Chief Complaint  Patient presents with   Dizziness     Brief history of present illness from the day of admission and additional interim summary    83 y.o.  female with history of CAD-multiple PCI's-last 2022, bacterial endocarditis, HTN, HLD, DM-2 who presented to the ED with weakness, confusion, headache, dizziness-upon further evaluation-she was found to have sepsis physiology secondary to complicated UTI.   Significant events: 6/11>> admit to [facility]   Significant studies: 6/11>> CT head: No acute intracranial abnormality 6/11>> CTA chest: No PE, unchanged left lung nodule. 6/11>> CT abdomen/pelvis: Left perinephric stranding-secondary to past calculi or ascending UTI. 6/12 echocardiogram - EF 60%, mild pericardial effusion, no major valvular abnormality   Significant microbiology data: 6/11>> COVID/influenza/RSV PCR: Negative 6/11>> urine culture: Pending 6/11>> blood culture: No growth                                                                 Hospital Course   Sepsis secondary to complicated UTI/nephritis (POA) Low-grade fever in the afternoon on 6/12 Blood cultures negative so far Urine culture growing multiple  species, suggested poor sample.  Responded very well to IV cephalosporin. Now on oral cephalosporin, stop date 02/22/2024.   Hypokalemia, and hypophosphatemia, hypomagnesemia, repleted, monitor intermittently at SNF   New onset A-fib RVR, likely paroxysmal.  [place] vas 2 score of greater than 4.  Went into A-fib 8:52 AM 02/15/2024, quired Cardizem  drip, converted to sinus rhythm evening of 02/15/2024, rate better, Cardizem  drip stopped, now on increased than home dose beta-blocker orally, dose increased further on 02/18/2024 along with Eliquis. Recent echo noted with preserved EF 60%, stable TSH and free T4.  Discussed with cardiology appreciate their input, now on Eliquis and Plavix , aspirin  discontinued.     Chronic HFpEF Relatively euvolemic Continue beta-blocker Echo LVEF 60%, mild pericardial effusion, no tamponade, no any other significant findings   CAD-history of multiple PCI's-last in 2022. No anginal symptoms Continue DAPT along with statin/metoprolol  dose increased on 02/15/2024,     HTN BP stable Continue beta-blocker, dose increased for better heart rate control and blood pressure control on 02/18/2024.   Dizziness, resolved Due to initial infection  and hypotension, completely resolved   Acute metabolic encephalopathy Likely secondary to sepsis/UTI. Completely resolved   Reported remote history of bacterial endocarditis (approximately 4-5 years ago when she was living in [place] ) Thankfully blood cultures negative so far No acute issues   CKD stage IIIb Creatinine close to baseline. Trend electrolytes intermittently at SNF.   GERD PPI   Left reverse shoulder arthroplasty planned with [facility] Recently evaluated by cardiology-felt to be acceptable risk to proceed with surgery.  Outpatient orthopedics follow-up.   Left lung nodule: Seen incidentally Unchanged 1.4 x 1.9 cm nodule-from prior studies per radiology report Repeat chest in 3 to 6 months recommended by  radiology, PCP to arrange outpatient pulmonary follow-up within 1 month of discharge.   Debility/deconditioning PT-OT, SNF.   Nighttime apneic episodes.  Likely has undiagnosed sleep apnea, outpatient sleep study, 2 L nighttime nasal cannula oxygen till she gets outpatient sleep study.  Recommend pulmonary follow-up within a month of discharge.     DM-2 (A1c 7.3 on 6/11) SSI     Discharge diagnosis     Principal Problem:   Dizziness Active Problems:   DM2 (diabetes mellitus, type 2) (HCC)   Essential hypertension   CAD (coronary artery disease)   Stage 3b chronic kidney disease (HCC)   Chronic diastolic CHF (congestive heart failure) (HCC)   Sepsis due to urinary tract infection (HCC)   Lethargy   Protein-calorie malnutrition, severe    Discharge instructions    Discharge Instructions     Discharge instructions   Complete by: As directed    Follow with Primary MD Vevelyn Gowers, NP in 7 days   Get CBC, CMP, 2 view Chest X ray -  checked next visit with your primary MD or SNF MD    Activity: As tolerated with Full fall precautions use walker/cane & assistance as needed  Disposition SNF  Diet: Heart Healthy Low Carb - check CBGs q. ACH S.  Special Instructions: If you have smoked or chewed Tobacco  in the last 2 yrs please stop smoking, stop any regular Alcohol   and or any Recreational drug use.  On your next visit with your primary care physician please Get Medicines reviewed and adjusted.  Please request your Prim.MD to go over all Hospital Tests and Procedure/Radiological results at the follow up, please get all Hospital records sent to your Prim MD by signing hospital release before you go home.  If you experience worsening of your admission symptoms, develop shortness of breath, life threatening emergency, suicidal or homicidal thoughts you must seek medical attention immediately by calling 911 or calling your MD immediately  if symptoms less severe.  You Must  read complete instructions/literature along with all the possible adverse reactions/side effects for all the Medicines you take and that have been prescribed to you. Take any new Medicines after you have completely understood and accpet all the possible adverse reactions/side effects.   Do not drive when taking Pain medications.  Do not take more than prescribed Pain, Sleep and Anxiety Medications  Wear Seat belts while driving.   Increase activity slowly   Complete by: As directed        Discharge Medications   Allergies as of 02/19/2024       Reactions   Sulfa  Antibiotics Itching   Niacin  And Related Other (See Comments)   Must take Flush-free   Codeine Nausea Only   Macrobid [nitrofurantoin] Nausea Only   Severe nausea   Prednisone Itching, Rash   Rocephin  [ceftriaxone ]  Itching   Sulfonamide Derivatives Itching   Tetracycline Itching, Rash        Medication List     STOP taking these medications    amLODipine  5 MG tablet Commonly known as: NORVASC    aspirin  EC 81 MG tablet   famotidine  40 MG tablet Commonly known as: PEPCID    losartan  25 MG tablet Commonly known as: COZAAR    metoprolol  succinate 50 MG 24 hr tablet Commonly known as: TOPROL -XL       TAKE these medications    acetaminophen  325 MG tablet Commonly known as: TYLENOL  Take 650 mg by mouth every 6 (six) hours as needed for moderate pain.   apixaban 2.5 MG Tabs tablet Commonly known as: ELIQUIS Take 1 tablet (2.5 mg total) by mouth 2 (two) times daily.   atorvastatin  80 MG tablet Commonly known as: LIPITOR  TAKE ONE TABLET BY MOUTH EVERYDAY AT BEDTIME   cefadroxil 500 MG capsule Commonly known as: DURICEF Take 1 capsule (500 mg total) by mouth 2 (two) times daily.   Cinnamon 500 MG capsule Take 2 capsules twice a day by oral route as directed.   clopidogrel  75 MG tablet Commonly known as: PLAVIX  TAKE ONE CAPSULE BY MOUTH EVERY MORNING   DAILY FIBER PO Take 1 capsule by mouth  daily.   dicyclomine  10 MG capsule Commonly known as: BENTYL  Take 10 mg by mouth 3 (three) times daily.   ezetimibe  10 MG tablet Commonly known as: ZETIA  TAKE ONE TABLET BY MOUTH EVERY MORNING   Fish Oil 1000 MG Caps Take 1,000 mg by mouth in the morning and at bedtime.   gabapentin  300 MG capsule Commonly known as: NEURONTIN  Take 300 mg by mouth 3 (three) times daily.   insulin  aspart 100 UNIT/ML FlexPen Commonly known as: NOVOLOG  Before each meal 3 times a day, 140-199 - 2 units, 200-250 - 4 units, 251-299 - 6 units,  300-349 - 8 units,  350 or above 10 units.   metoprolol  tartrate 100 MG tablet Commonly known as: LOPRESSOR  Take 1 tablet (100 mg total) by mouth 2 (two) times daily.   montelukast 10 MG tablet Commonly known as: SINGULAIR Take 10 mg by mouth at bedtime.   nitroGLYCERIN  0.4 MG SL tablet Commonly known as: NITROSTAT  DISSOLVE 1 TABLET UNDER THE TONGUE EVERY 5 MINUTES AS NEEDED FOR CHEST PAIN. DO NOT EXCEED A TOTAL OF 3 DOSES IN 15 MINUTES.   ondansetron  4 MG tablet Commonly known as: ZOFRAN  Take 4 mg by mouth every 8 (eight) hours as needed for vomiting or nausea.   pantoprazole  40 MG tablet Commonly known as: PROTONIX  Take 40 mg by mouth 2 (two) times daily.   venlafaxine  XR 37.5 MG 24 hr capsule Commonly known as: EFFEXOR -XR Take 37.5 mg by mouth every morning.   Vitamin C  500 MG Caps Take 500 mg by mouth in the morning and at bedtime.         Contact information for follow-up providers     Marlyse Single T, PA-C Follow up.   Specialties: Cardiology, Physician Assistant Why: 2:20 PM, Hospital Follow Up, Please arrive 15 min early. Bring all medications. Contact information: [address] [place] [address] [phone]         Hunsucker, Archer Kobs, MD. Schedule an appointment as soon as possible for a visit in 2 week(s).   Specialty: Pulmonary Disease Why: Lung nodule, sleep study Contact information: [address] [place] [address] [phone]  Vevelyn Gowers, NP. Schedule an appointment as soon as possible for a visit in 1 week(s).   Specialty: Nurse Practitioner Contact information: [address] [place] [address] [phone]              Contact information for after-discharge care     Destination     [facility] .   Service: Skilled Nursing Contact information: [address] [place]   [address] [phone]                     Major procedures and Radiology Reports - PLEASE review detailed and final reports thoroughly  -       ECHOCARDIOGRAM COMPLETE Result Date: 02/13/2024    ECHOCARDIOGRAM REPORT   Patient Name:   LAURITA PERON Date of Exam: 02/13/2024 Medical Rec #:  [id number]       Height:       60.0 in Accession #:    [id number]      Weight:       124.0 lb Date of Birth:  [birth year]       BSA:          1.523 m Patient Age:    83 years        BP:           156/70 mmHg Patient Gender: F               HR:           94 bpm. Exam Location:  Inpatient Procedure: 2D Echo, Cardiac Doppler and Color Doppler (Both Spectral and Color            Flow Doppler were utilized during procedure). Indications:    Sepsis [facility])  History:        Patient has prior history of Echocardiogram examinations, most                 recent 01/15/2022. CAD, Stroke; Risk Factors:Hypertension and                 Diabetes.  Sonographer:    Astrid Blamer Referring Phys: Alcide Humble PATEL IMPRESSIONS  1. Left ventricular ejection fraction, by estimation, is 65 to 70%. The left ventricle has normal function. The left ventricle has no regional wall motion abnormalities. There is mild left ventricular hypertrophy. Left ventricular diastolic parameters were normal.  2. Right ventricular systolic function is normal. The right ventricular size is not well visualized. Tricuspid regurgitation signal is inadequate for assessing PA  pressure.  3. Left atrial size was mildly dilated.  4. A small pericardial effusion is present. There is no evidence of cardiac tamponade.  5. The mitral valve is degenerative. Mild mitral valve regurgitation. No evidence of mitral stenosis.  6. The aortic valve was not well visualized. There is mild calcification of the aortic valve. Aortic valve regurgitation is mild. Aortic valve sclerosis/calcification is present, without any evidence of aortic stenosis.  7. The inferior vena cava is normal in size with greater than 50% respiratory variability, suggesting right atrial pressure of 3 mmHg. Comparison(s): No significant change from prior study. Conclusion(s)/Recommendation(s): Otherwise normal echocardiogram, with minor abnormalities described in the report. FINDINGS  Left Ventricle: Left ventricular ejection fraction, by estimation, is 65 to 70%. The left ventricle has normal function. The left ventricle has no regional wall motion abnormalities. The left ventricular internal cavity size was normal in size. There is  mild left ventricular hypertrophy. Left  ventricular diastolic parameters were normal. Right Ventricle: The right ventricular size is not well visualized. Right vetricular wall thickness was not well visualized. Right ventricular systolic function is normal. Tricuspid regurgitation signal is inadequate for assessing PA pressure. Left Atrium: Left atrial size was mildly dilated. Right Atrium: Right atrial size was normal in size. Pericardium: A small pericardial effusion is present. There is no evidence of cardiac tamponade. Mitral Valve: The mitral valve is degenerative in appearance. There is mild thickening of the mitral valve leaflet(s). There is mild calcification of the mitral valve leaflet(s). Mild to moderate mitral annular calcification. Mild mitral valve regurgitation. No evidence of mitral valve stenosis. Tricuspid Valve: The tricuspid valve is not well visualized. Tricuspid valve  regurgitation is trivial. No evidence of tricuspid stenosis. Aortic Valve: The aortic valve was not well visualized. There is mild calcification of the aortic valve. Aortic valve regurgitation is mild. Aortic valve sclerosis/calcification is present, without any evidence of aortic stenosis. Aortic valve mean gradient measures 8.0 mmHg. Aortic valve peak gradient measures 11.7 mmHg. Aortic valve area, by VTI measures 1.33 cm. Pulmonic Valve: The pulmonic valve was not well visualized. Pulmonic valve regurgitation is trivial. No evidence of pulmonic stenosis. Aorta: The aortic root, ascending aorta and aortic arch are all structurally normal, with no evidence of dilitation or obstruction. Venous: The inferior vena cava is normal in size with greater than 50% respiratory variability, suggesting right atrial pressure of 3 mmHg. IAS/Shunts: The atrial septum is grossly normal.  LEFT VENTRICLE PLAX 2D LVIDd:         3.50 cm   Diastology LVIDs:         2.20 cm   LV e' medial:    5.44 cm/s LV PW:         0.90 cm   LV E/e' medial:  16.9 LV IVS:        0.90 cm   LV e' lateral:   8.49 cm/s LVOT diam:     1.70 cm   LV E/e' lateral: 10.9 LV SV:         46 LV SV Index:   30 LVOT Area:     2.27 cm  RIGHT VENTRICLE RV S prime:     16.40 cm/s TAPSE (M-mode): 2.6 cm LEFT ATRIUM             Index        RIGHT ATRIUM           Index LA Vol (A2C):   40.0 ml 26.26 ml/m  RA Area:     11.20 cm LA Vol (A4C):   43.7 ml 28.69 ml/m  RA Volume:   23.30 ml  15.29 ml/m LA Biplane Vol: 44.3 ml 29.08 ml/m  AORTIC VALVE AV Area (Vmax):    1.34 cm AV Area (Vmean):   1.12 cm AV Area (VTI):     1.33 cm AV Vmax:           171.00 cm/s AV Vmean:          135.000 cm/s AV VTI:            0.344 m AV Peak Grad:      11.7 mmHg AV Mean Grad:      8.0 mmHg LVOT Vmax:         101.00 cm/s LVOT Vmean:        66.700 cm/s LVOT VTI:          0.201 m LVOT/AV VTI ratio: 0.58  AORTA Ao  Root diam: 2.50 cm MITRAL VALVE MV Area (PHT): 5.54 cm    SHUNTS MV Decel  Time: 137 msec    Systemic VTI:  0.20 m MV E velocity: 92.20 cm/s  Systemic Diam: 1.70 cm MV A velocity: 99.00 cm/s MV E/A ratio:  0.93 Sheryle Donning MD Electronically signed by Sheryle Donning MD Signature Date/Time: 02/13/2024/12:05:36 PM    Final    CT ABDOMEN PELVIS W CONTRAST Result Date: 02/12/2024 CLINICAL DATA:  Sepsis, dizziness EXAM: CT ABDOMEN AND PELVIS WITH CONTRAST TECHNIQUE: Multidetector CT imaging of the abdomen and pelvis was performed using the standard protocol following bolus administration of intravenous contrast. RADIATION DOSE REDUCTION: This exam was performed according to the departmental dose-optimization program which includes automated exposure control, adjustment of the mA and/or kV according to patient size and/or use of iterative reconstruction technique. CONTRAST:  65mL OMNIPAQUE  IOHEXOL  350 MG/ML SOLN COMPARISON:  None available. FINDINGS: Motion degraded study. Lower chest: For findings above the diaphragm, please see the separately dictated CT of the chest report, which was performed concurrently. Hepatobiliary: No mass.Cholecystectomy. No intrahepatic or extrahepatic biliary ductal dilation. The portal veins are patent. Pancreas: No mass or main ductal dilation. No peripancreatic inflammation or fluid collection. Spleen: Normal size. No mass. Adrenals/Urinary Tract: No adrenal masses. No renal mass. Somewhat asymmetric enhancement of the renal parenchyma with decreased enhancement noted in the left mid and upper portions. Left perinephric stranding. No nephrolithiasis or hydronephrosis. The urinary bladder is distended without focal abnormality. Stomach/Bowel: Small hiatal hernia. The stomach contains ingested material without focal abnormality. No small bowel wall thickening or inflammation. No small bowel obstruction.The appendix was not visualized. No right lower quadrant or pericecal inflammatory changes to suggest acute appendicitis. Subtle inflammatory  stranding about the splenic flexure of the colon. Vascular/Lymphatic: No aortic aneurysm. Diffuse aortoiliac atherosclerosis. No intraabdominal or pelvic lymphadenopathy. Reproductive: Hysterectomy. No concerning adnexal mass.No free pelvic fluid. Other: No pneumoperitoneum, ascites, or mesenteric inflammation. Musculoskeletal: No acute fracture or destructive lesion. Osteopenia. Multilevel degenerative disc disease of the spine. Mild bilateral hip osteoarthritis. IMPRESSION: 1. Left perinephric stranding with somewhat asymmetric enhancement of the left renal parenchyma, which may be due to a recently passed calculus or an ascending urinary tract infection. Correlation with urinalysis recommended. 2. Subtle inflammatory stranding about the splenic flexure of the colon, which may be reactive to the left perinephric stranding. Alternatively, a focal infectious or inflammatory colitis could also have this appearance in the correct clinical context. Aortic Atherosclerosis (ICD10-I70.0). Electronically Signed   By: Rance Burrows M.D.   On: 02/12/2024 16:22   CT Angio Chest Pulmonary Embolism (PE) W or WO Contrast Result Date: 02/12/2024 CLINICAL DATA:  Pulmonary embolism (PE) suspected, high prob sepsis. EXAM: CT ANGIOGRAPHY CHEST WITH CONTRAST TECHNIQUE: Multidetector CT imaging of the chest was performed using the standard protocol during bolus administration of intravenous contrast. Multiplanar CT image reconstructions and MIPs were obtained to evaluate the vascular anatomy. RADIATION DOSE REDUCTION: This exam was performed according to the departmental dose-optimization program which includes automated exposure control, adjustment of the mA and/or kV according to patient size and/or use of iterative reconstruction technique. CONTRAST:  65mL OMNIPAQUE  IOHEXOL  350 MG/ML SOLN COMPARISON:  June 11, 2023 FINDINGS: Motion degraded study. Pulmonary Embolism: No pulmonary embolism to the level of the proximal  segmental pulmonary arteries bilaterally. The distal segmental and subsegmental branches are degraded by motion. Cardiovascular: No cardiomegaly or pericardial effusion. No aortic aneurysm. Diffuse atherosclerosis. Multi-vessel coronary atherosclerosis. Mediastinum/Nodes: No mediastinal mass. No mediastinal, hilar,  or axillary lymphadenopathy. Small sliding-type hiatal hernia. Lungs/Pleura: The midline trachea and bronchi are patent. No focal airspace consolidation, pleural effusion, or pneumothorax. Unchanged 1.4 x 1.9 cm nodule in the medial left lung apex (axial 15). The solid subpleural nodule in the lateral right lower lobe (axial 82 previously), is now more ground-glass than on the prior study. Musculoskeletal: No acute fracture or destructive bone lesion. Anterior and posterior cervical fusion hardware is partially visualized. Osteopenia. Multilevel degenerative disc disease of the spine. Severe left glenohumeral joint osteoarthritis with moderate right glenohumeral joint osteoarthritis. Upper Abdomen: For findings below the diaphragm, please see the separately dictated CT of the abdomen and pelvis report, which was performed concurrently. Review of the MIP images confirms the above findings. IMPRESSION: 1. No pulmonary embolism to the level of the proximal segmental pulmonary arteries bilaterally. The distal segmental and subsegmental branches are degraded by motion. 2. No pneumonia, pulmonary edema, or pleural effusion. 3. Unchanged 1.4 x 1.9 cm nodule in the medial left lung apex (axial 15). The solid subpleural nodule in the lateral right lower lobe is more ground-glass than on the prior study. A repeat chest CT in 3-6 months is recommended. 4. Small sliding-type hiatal hernia. Aortic Atherosclerosis (ICD10-I70.0). Electronically Signed   By: Rance Burrows M.D.   On: 02/12/2024 16:07   CT Head Wo Contrast Result Date: 02/12/2024 CLINICAL DATA:  83 year old female with altered mental status,  dizziness, confusion, headache times 2 days. EXAM: CT HEAD WITHOUT CONTRAST TECHNIQUE: Contiguous axial images were obtained from the base of the skull through the vertex without intravenous contrast. RADIATION DOSE REDUCTION: This exam was performed according to the departmental dose-optimization program which includes automated exposure control, adjustment of the mA and/or kV according to patient size and/or use of iterative reconstruction technique. COMPARISON:  Head CT 10/22/2023. FINDINGS: Brain: Cerebral volume is within normal limits for age. No midline shift, ventriculomegaly, mass effect, evidence of mass lesion, intracranial hemorrhage or evidence of cortically based acute infarction. Patchy bilateral cerebral white matter hypodensity. Chronic heterogeneity in the deep gray nuclei, more so on the left. Stable gray-white matter differentiation throughout the brain. Vascular: No suspicious intracranial vascular hyperdensity. Calcified atherosclerosis at the skull base. Skull: Stable. 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 No acute osseous abnormality identified. Sinuses/Orbits: Visualized paranasal sinuses and mastoids are clear. Other: No acute orbit or scalp soft tissue finding. IMPRESSION: 1. No acute intracranial abnormality. 2. 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 Electronically Signed   By: Marlise Simpers M.D.   On: 02/12/2024 12:11   DG Chest Port 1 View Result Date: 02/12/2024 CLINICAL DATA:  Fever. EXAM: PORTABLE CHEST 1 VIEW COMPARISON:  Chest radiograph dated 09/26/2022. FINDINGS: No focal consolidation, pleural effusion or pneumothorax. The cardiac silhouette is within limits. Small metallic clip in the left upper lobe similar to prior radiograph. Atherosclerotic calcification of the aorta. No acute osseous pathology. IMPRESSION: No active disease. Electronically Signed   By: Angus Bark M.D.   On: 02/12/2024 11:31   DG Knee 1-2 Views Left Result Date:  01/29/2024 CLINICAL DATA:  Pain after fall EXAM: LEFT KNEE - 1-2 VIEW COMPARISON:  None Available. FINDINGS: Left knee total arthroplasty is present. There is anterior knee soft tissue swelling. Questionable joint effusion. No acute fracture identified. Joint spaces are maintained. No hardware loosening. IMPRESSION: 1. Left knee total arthroplasty. No acute fracture identified. 2. Anterior knee soft tissue swelling. Electronically Signed   By: Tyron Gallon M.D.   On:  01/29/2024 15:21   US  Venous Img Lower  Left (DVT Study) Result Date: 01/29/2024 CLINICAL DATA:  Left lower extremity pain and swelling greater than 1 week EXAM: Left LOWER EXTREMITY VENOUS DOPPLER ULTRASOUND TECHNIQUE: Gray-scale sonography with compression, as well as color and duplex ultrasound, were performed to evaluate the deep venous system(s) from the level of the common femoral vein through the popliteal and proximal calf veins. COMPARISON:  None Available. FINDINGS: VENOUS Normal compressibility of the common femoral, superficial femoral, and popliteal veins, as well as the visualized calf veins. Visualized portions of profunda femoral vein and great saphenous vein unremarkable. No filling defects to suggest DVT on grayscale or color Doppler imaging. Doppler waveforms show normal direction of venous flow, normal respiratory plasticity and response to augmentation. Limited views of the contralateral common femoral vein are unremarkable. OTHER None. Limitations: none IMPRESSION: Negative for acute left lower extremity DVT. Electronically Signed   By: Susan Ensign   On: 01/29/2024 14:09    Micro Results     Recent Results (from the past 240 hours)  Blood Culture (routine x 2)     Status: None   Collection Time: 02/12/24 10:38 AM   Specimen: BLOOD RIGHT FOREARM  Result Value Ref Range Status   Specimen Description BLOOD RIGHT FOREARM  Final   Special Requests   Final    BOTTLES DRAWN AEROBIC AND ANAEROBIC Blood Culture results  may not be optimal due to an inadequate volume of blood received in culture bottles   Culture   Final    NO GROWTH 5 DAYS Performed at [facility], [address] [place], [place] [address]    Report Status 02/17/2024 FINAL  Final  Blood Culture (routine x 2)     Status: None   Collection Time: 02/12/24 10:45 AM   Specimen: BLOOD  Result Value Ref Range Status   Specimen Description BLOOD SITE NOT SPECIFIED  Final   Special Requests   Final    BOTTLES DRAWN AEROBIC AND ANAEROBIC Blood Culture results may not be optimal due to an inadequate volume of blood received in culture bottles   Culture   Final    NO GROWTH 5 DAYS Performed at [facility], [address] [place], [place] [address]    Report Status 02/17/2024 FINAL  Final  Resp panel by RT-PCR (RSV, Flu A&B, Covid) Anterior Nasal Swab     Status: None   Collection Time: 02/12/24 11:06 AM   Specimen: Anterior Nasal Swab  Result Value Ref Range Status   SARS Coronavirus 2 by RT PCR NEGATIVE NEGATIVE Final   Influenza A by PCR NEGATIVE NEGATIVE Final   Influenza B by PCR NEGATIVE NEGATIVE Final    Comment: (NOTE) The Xpert Xpress SARS-CoV-2/FLU/RSV plus assay is intended as an aid in the diagnosis of influenza from Nasopharyngeal swab specimens and should not be used as a sole basis for treatment. Nasal washings and aspirates are unacceptable for Xpert Xpress SARS-CoV-2/FLU/RSV testing.  Fact Sheet for Patients: BloggerCourse.com  Fact Sheet for Healthcare Providers: SeriousBroker.it  This test is not yet approved or cleared by the [place]  FDA and has been authorized for detection and/or diagnosis of SARS-CoV-2 by FDA under an Emergency Use Authorization (EUA). This EUA will remain in effect (meaning this test can be used) for the duration of the COVID-19 declaration under Section 564(b)(1) of the Act, 21 U.S.C. section 360bbb-3(b)(1), unless the  authorization is terminated or revoked.     Resp Syncytial  Virus by PCR NEGATIVE NEGATIVE Final    Comment: (NOTE) Fact Sheet for Patients: BloggerCourse.com  Fact Sheet for Healthcare Providers: SeriousBroker.it  This test is not yet approved or cleared by the [place]  FDA and has been authorized for detection and/or diagnosis of SARS-CoV-2 by FDA under an Emergency Use Authorization (EUA). This EUA will remain in effect (meaning this test can be used) for the duration of the COVID-19 declaration under Section 564(b)(1) of the Act, 21 U.S.C. section 360bbb-3(b)(1), unless the authorization is terminated or revoked.  Performed at [facility], [address] [place], [place] [address]   Urine Culture     Status: Abnormal   Collection Time: 02/12/24 11:49 AM   Specimen: Urine, Catheterized  Result Value Ref Range Status   Specimen Description URINE, CATHETERIZED  Final   Special Requests   Final    NONE Reflexed from [id number] Performed at [facility], [address], [place], [place] [address]    Culture MULTIPLE SPECIES PRESENT, SUGGEST RECOLLECTION (A)  Final   Report Status 02/13/2024 FINAL  Final    Today   Subjective    Ronne Albo today has no headache,no chest abdominal pain,no new weakness tingling or numbness, feels much better wants to go home today.     Objective   Blood pressure (!) 146/73, pulse 72, temperature (!) 97.5 F (36.4 C), temperature source Axillary, resp. rate 10, height 5' (1.524 m), weight 56.2 kg, SpO2 96%.  No intake or output data in the 24 hours ending 02/19/24 1007  Exam  Awake Alert, No new F.N deficits,    [place].AT,PERRAL Supple Neck,   Symmetrical Chest wall movement, Good air movement bilaterally, CTAB RRR,No Gallops,   +ve B.Sounds, Abd Soft, Non tender,  No Cyanosis, Clubbing or edema    Data Review   Recent Labs  Lab 02/12/24 1039 02/12/24 1055  02/14/24 0721 02/15/24 0728 02/16/24 0759 02/17/24 0541 02/18/24 0457  WBC 12.6*   < > 6.2 6.4 7.5 9.0 9.8  HGB 13.9   < > 12.6 11.9* 11.7* 11.7* 11.4*  HCT 44.1   < > 38.5 35.5* 35.3* 35.3* 34.0*  PLT 233   < > 186 226 261 320 412*  MCV 98.4   < > 93.7 91.5 93.6 93.1 93.2  MCH 31.0   < > 30.7 30.7 31.0 30.9 31.2  MCHC 31.5   < > 32.7 33.5 33.1 33.1 33.5  RDW 13.2   < > 12.8 12.9 12.9 12.8 12.7  LYMPHSABS 0.8  --   --  1.1 1.5 1.7 1.7  MONOABS 0.8  --   --  1.2* 0.9 1.0 1.1*  EOSABS 0.0  --   --  0.1 0.2 0.3 0.3  BASOSABS 0.1  --   --  0.1 0.1 0.1 0.1   < > = values in this interval not displayed.    Recent Labs  Lab 02/12/24 1039 02/12/24 1055 02/12/24 1056 02/12/24 1359 02/12/24 1607 02/12/24 2118 02/13/24 0758 02/14/24 0721 02/14/24 1042 02/15/24 0728 02/16/24 0759 02/17/24 0541 02/18/24 0457  NA 131*   < >  --   --   --   --  131* 134*  --  136 137 136 137  K 4.4   < >  --   --   --   --  3.6 3.3*  --  3.3* 3.6 3.7 4.5  CL 97*   < >  --   --   --   --  96* 96*  --  96* 100 102 102  CO2 22  --   --   --   --   --  25 26  --  30 28 25 27   ANIONGAP 12  --   --   --   --   --  10 12  --  10 9 9 8   GLUCOSE 330*   < >  --   --   --   --  184* 160*  --  173* 168* 170* 160*  BUN 25*   < >  --   --   --   --  13 13  --  13 9 13 10   CREATININE 1.30*   < >  --   --   --   --  0.81 0.82  --  0.68 0.65 0.71 0.59  AST 22  --   --   --   --   --  21  --   --   --   --   --   --   ALT 17  --   --   --   --   --  17  --   --   --   --   --   --   ALKPHOS 72  --   --   --   --   --  53  --   --   --   --   --   --   BILITOT 0.8  --   --   --   --   --  0.8  --   --   --   --   --   --   ALBUMIN 3.0*  --   --   --   --   --  2.4*  --   --   --   --   --   --   CRP  --   --   --   --  13.3*  --   --   --   --   --   --   --   --   PROCALCITON 25.08  --   --   --   --   --  12.10  --   --   --   --   --   --   LATICACIDVEN  --   --  2.0* 4.0*  --  1.4  --   --   --   --   --   --    --   INR 1.2  --   --   --   --   --   --   --   --   --   --   --   --   TSH  --   --   --   --   --   --   --   --   --  2.863  --   --   --   HGBA1C 7.3*  --   --   --   --   --   --   --   --   --   --   --   --   BNP  --   --   --   --   --  164.4*  --   --   --  86.3 175.5* 82.9 79.4  MG  --   --   --   --   --   --   --   --  1.8 1.9 1.9 1.9 1.7  PHOS  --   --   --   --   --   --   --   --  2.3* 3.7 2.7 2.0* 3.5  CALCIUM  8.4*  --   --   --   --   --  7.8* 8.2*  --  8.0* 8.4* 8.0* 8.0*   < > = values in this interval not displayed.    Total Time in preparing paper work, data evaluation and todays exam - 35 minutes  Signature  -    Lynnwood Sauer M.D on 02/19/2024 at 10:07 AM   -  To page go to [email or web address]

## 2024-03-02 ENCOUNTER — Encounter: Payer: Self-pay | Admitting: Physician Assistant

## 2024-03-02 ENCOUNTER — Ambulatory Visit: Attending: Physician Assistant | Admitting: Physician Assistant

## 2024-03-02 ENCOUNTER — Telehealth: Payer: Self-pay | Admitting: *Deleted

## 2024-03-02 ENCOUNTER — Other Ambulatory Visit (HOSPITAL_COMMUNITY): Payer: Self-pay

## 2024-03-02 VITALS — BP 180/70 | HR 74 | Ht 60.0 in | Wt 117.8 lb

## 2024-03-02 DIAGNOSIS — E785 Hyperlipidemia, unspecified: Secondary | ICD-10-CM | POA: Diagnosis not present

## 2024-03-02 DIAGNOSIS — I25119 Atherosclerotic heart disease of native coronary artery with unspecified angina pectoris: Secondary | ICD-10-CM

## 2024-03-02 DIAGNOSIS — I480 Paroxysmal atrial fibrillation: Secondary | ICD-10-CM | POA: Insufficient documentation

## 2024-03-02 DIAGNOSIS — I10 Essential (primary) hypertension: Secondary | ICD-10-CM | POA: Diagnosis not present

## 2024-03-02 DIAGNOSIS — I2510 Atherosclerotic heart disease of native coronary artery without angina pectoris: Secondary | ICD-10-CM

## 2024-03-02 MED ORDER — APIXABAN 2.5 MG PO TABS
2.5000 mg | ORAL_TABLET | Freq: Two times a day (BID) | ORAL | 3 refills | Status: DC
  Filled 2024-03-02: qty 180, 90d supply, fill #0

## 2024-03-02 MED ORDER — CLOPIDOGREL BISULFATE 75 MG PO TABS
75.0000 mg | ORAL_TABLET | Freq: Every morning | ORAL | 3 refills | Status: DC
  Filled 2024-03-02: qty 90, 90d supply, fill #0

## 2024-03-02 MED ORDER — METOPROLOL TARTRATE 100 MG PO TABS
100.0000 mg | ORAL_TABLET | Freq: Two times a day (BID) | ORAL | 3 refills | Status: DC
  Filled 2024-03-02: qty 180, 90d supply, fill #0
  Filled 2024-06-24: qty 180, 90d supply, fill #1

## 2024-03-02 MED ORDER — NITROGLYCERIN 0.4 MG SL SUBL
0.4000 mg | SUBLINGUAL_TABLET | SUBLINGUAL | 11 refills | Status: DC | PRN
  Filled 2024-03-02: qty 25, 8d supply, fill #0

## 2024-03-02 MED ORDER — ATORVASTATIN CALCIUM 80 MG PO TABS
80.0000 mg | ORAL_TABLET | Freq: Every evening | ORAL | 3 refills | Status: AC
  Filled 2024-03-02: qty 90, 90d supply, fill #0
  Filled 2024-06-15: qty 90, 90d supply, fill #1

## 2024-03-02 MED ORDER — AMLODIPINE BESYLATE 5 MG PO TABS
5.0000 mg | ORAL_TABLET | Freq: Every day | ORAL | 3 refills | Status: DC
  Filled 2024-03-02: qty 90, 90d supply, fill #0

## 2024-03-02 MED ORDER — EZETIMIBE 10 MG PO TABS
10.0000 mg | ORAL_TABLET | Freq: Every morning | ORAL | 3 refills | Status: DC
Start: 2024-03-02 — End: 2024-03-23
  Filled 2024-03-02: qty 90, 90d supply, fill #0

## 2024-03-02 MED ORDER — APIXABAN 2.5 MG PO TABS: 2.5000 mg | ORAL_TABLET | Freq: Two times a day (BID) | ORAL | 0 refills | Status: DC

## 2024-03-02 NOTE — Assessment & Plan Note (Signed)
 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  She underwent POBA to the mid RCA stent and DES to the distal RCA (overlapping with the prior stent) in 07/2017 in [place]  and DES to the RCA in 7/21.  Her last PCI was a DES to the LAD in 06/2021.  Nuclear stress test in 11/2023 was low risk. She was kept on Clopidogrel  due to hx of multiple stents. She is tolerating Clopidogrel  and Eliquis . - Continue Plavix  75 mg daily - Continue Lipitor  80 mg daily - Continue metoprolol  tartrate 100 mg twice daily

## 2024-03-02 NOTE — Patient Instructions (Signed)
 Medication Instructions:  Your physician has recommended you make the following change in your medication:   RESTART Amlodipine  5 mg taking 1 daily  YOU CAN PICK UP YOUR CARDIAC MEDICATIONS AT OUR LOCAL PHARMACY ON THE 1ST FLOOR.  *If you need a refill on your cardiac medications before your next appointment, please call your pharmacy*  Lab Work: None ordered  If you have labs (blood work) drawn today and your tests are completely normal, you will receive your results only by: MyChart Message (if you have MyChart) OR A paper copy in the mail If you have any lab test that is abnormal or we need to change your treatment, we will call you to review the results.  Testing/Procedures: None ordered  Follow-Up: At [facility], you and your health needs are our priority.  As part of our continuing mission to provide you with exceptional heart care, our providers are all part of one team.  This team includes your primary Cardiologist (physician) and Advanced Practice Providers or APPs (Physician Assistants and Nurse Practitioners) who all work together to provide you with the care you need, when you need it.  Your next appointment:   As scheduled   Provider:   Ozell Fell, MD    We recommend signing up for the patient portal called MyChart.  Sign up information is provided on this After Visit Summary.  MyChart is used to connect with patients for Virtual Visits (Telemedicine).  Patients are able to view lab/test results, encounter notes, upcoming appointments, etc.  Non-urgent messages can be sent to your provider as well.   To learn more about what you can do with MyChart, go to [email or web address].   Other Instructions

## 2024-03-02 NOTE — Assessment & Plan Note (Signed)
 Diagnosed during recent hospitalization for urosepsis. She has had one episode of rapid palpitations since discharge. She is currently maintaining NSR based upon exam.  - Continue Eliquis  2.5 mg twice daily. Will try to provide samples today if we are able.  - Continue metoprolol  tartrate 100 mg twice daily - Advised her to take an extra half dose of metoprolol  if she has sustained rapid palpitations - Consider wearing a monitor to assess AFib burden if palpitations are frequent - Follow up in September with Dr. Wonda as planned. Will likely get BMET, CBC at that time.

## 2024-03-02 NOTE — Progress Notes (Signed)
 OFFICE NOTE:    Date:  03/02/2024  ID:  RAIGEN JAGIELSKI, DOB [birth year], MRN [id number] PCP: Arloa Jarvis, NP  [facility] Providers Cardiologist:  Ozell Fell, MD Cardiology APP:  Lelon Glendia DASEN, PA-C       Patient Profile:  Coronary artery disease  S/p Inf MI in 2007 >> PCI: DES to RCA S/p POBA to [facility] and DES to dRCA in 07/2017 ([facility] in [place], [place]) Mid LAD mod dz >> neg by FFR Myoview  11/2019: no ischemia  Cath 03/09/20: pRCA 95 >> PCI: 2.75 x 18 mm DES  S/p 3 x 18 mm DES to pLAD in 06/2021 [facility] 06/28/21: pLAD 70 (RFR 0.88) >> PCI; mLAD 30; pLCx 30; mRCA stents patent Pharm MPI 11/15/23: no ischemia, EF 71, low risk  Paroxysmal atrial fibrillation  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 in [place], [place] Rx with IV antibiotics x 3 weeks Echocardiogram 10/2018: EF 55-60, AoV calcification (?Lambl's Excrescence) 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 TTE 02/13/24: EF 65-70, no RWMA, mild LVH, mild LAE, small pericardial effusion, mild MR< mild AI, AV sclerosis Palpitations: SVT/PACs Monitor 12/2023: occ short SV runs (longest 17.9 sec), PACs 5% Hypertension  Hyperlipidemia  Diabetes mellitus  Carotid US  01/15/22: Bilat ICA 1-39 Lung nodule (CT 02/2024: 1.4 x 1.9 cm medial left lung apex >>Rpt 6-12 mos)        Discussed the use of AI scribe software for clinical note transcription with the patient, who gave verbal consent to proceed. History of Present Illness [name] [name] is a 83 y.o. female who returns for post hosp follow up. She was last seen for surgical clearance for shoulder surgery by Olivia Pavy, PA-C in 01/2024. She was admitted 6/11-6/18 with urosepsis and was noted to have episodes of atrial fibrillation with RVR. She converted to NSR prior to DC. Her beta-blocker dose was adjusted. She was seen by Cardiology and Eliquis  was started, ASA DCd. It was felt she would need long term anticoagulation.    She was discharged to [facility]. She just went home yesterday. Since discharge, she has had two episodes of rapid heart rate, one at rehab and another after returning home, but no episodes today. No chest discomfort, pain, pressure, or shortness of breath. Her blood pressure has been high since discharge from rehab, measuring 189/70. She was previously on amlodipine  and losartan , which were stopped during her hospitalization.      Review of Systems  Gastrointestinal:  Negative for hematochezia and melena.  Genitourinary:  Negative for hematuria.  -See HPI     Studies Reviewed:      Results LABS Hb: 11.4 g/dL (93/82/7974) Cr: 9.40 mg/dL (93/82/7974) K: 4.5 mmol/L (02/18/2024) ALT: 17 U/L (02/13/2024)  Risk Assessment/Calculations:  CHA2DS2-VASc Score = 6   This indicates a 9.7% annual risk of stroke. The patient's score is based upon: CHF History: 0 HTN History: 1 Diabetes History: 1 Stroke History: 0 Vascular Disease History: 1 Age Score: 2 Gender Score: 1    HYPERTENSION CONTROL Vitals:   03/02/24 1410 03/02/24 1457  BP: (!) 156/50 (!) 180/70    The patient's blood pressure is elevated above target today.  In order to address the patient's elevated BP: A new medication was prescribed today.         Physical Exam:  VS:  BP (!) 180/70   Pulse 74   Ht 5' (1.524 m)   Wt 117 lb 12.8 oz (53.4 kg)  SpO2 96%   BMI 23.01 kg/m        Wt Readings from Last 3 Encounters:  03/02/24 117 lb 12.8 oz (53.4 kg)  02/12/24 124 lb (56.2 kg)  01/29/24 122 lb (55.3 kg)    Constitutional:      Appearance: Healthy appearance. Not in distress.  Neck:     Vascular: JVD normal.  Pulmonary:     Breath sounds: Normal breath sounds. No wheezing. No rales.  Cardiovascular:     Normal rate. Regular rhythm.     Murmurs: There is no murmur.  Edema:    Peripheral edema absent.  Abdominal:     Palpations: Abdomen is soft.        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  She underwent POBA to the mid RCA stent and DES to the distal RCA (overlapping with the prior stent) in 07/2017 in [place]  and DES to the RCA in 7/21.  Her last PCI was a DES to the LAD in 06/2021.  Nuclear stress test in 11/2023 was low risk. She was kept on Clopidogrel  due to hx of multiple stents. She is tolerating Clopidogrel  and Eliquis . - Continue Plavix  75 mg daily - Continue Lipitor  80 mg daily - Continue metoprolol  tartrate 100 mg twice daily Paroxysmal atrial fibrillation (HCC) Diagnosed during recent hospitalization for urosepsis. She has had one episode of rapid palpitations since discharge. She is currently maintaining NSR based upon exam.  - Continue Eliquis  2.5 mg twice daily. Will try to provide samples today if we are able.  - Continue metoprolol  tartrate 100 mg twice daily - Advised her to take an extra half dose of metoprolol  if she has sustained rapid palpitations - Consider wearing a monitor to assess AFib burden if palpitations are frequent - Follow up in September with Dr. Wonda as planned. Will likely get BMET, CBC at that time.  Hyperlipidemia with target LDL less than 100 Managed with Lipitor  and Zetia . - Continue Lipitor  80 mg daily - Continue Zetia  10 mg daily Essential hypertension Blood pressure is currently uncontrolled. Losartan  and amlodipine  were DC'd while she was hospitalized.  - Restart amlodipine  5 mg daily - Continue metoprolol  tartrate 100 mg twice daily - Monitor blood pressure at home - Contact us  if blood pressure remains above 140/90 - Consider restarting losartan  if blood pressure remains uncontrolled      Dispo:  Return in about 3 months (around 05/29/2024) for Scheduled Follow Up, w/ Dr. Wonda.  Signed, Glendia Ferrier, PA-C

## 2024-03-02 NOTE — Assessment & Plan Note (Signed)
 Blood pressure is currently uncontrolled. Losartan  and amlodipine  were DC'd while she was hospitalized.  - Restart amlodipine  5 mg daily - Continue metoprolol  tartrate 100 mg twice daily - Monitor blood pressure at home - Contact us  if blood pressure remains above 140/90 - Consider restarting losartan  if blood pressure remains uncontrolled

## 2024-03-02 NOTE — Telephone Encounter (Signed)
 Medication name/dosage: Samples List: Eliquis  2.5 mg  Administration instructions:   Reason for samples: Reason for samples: new start  Ordering provider: Glendia Ferrier, PA-C  *Once above information entered, route the phone encounter to CV DIV MAG ST SAMPLES and send Teams message to team member assigned to Samples for the day.  PT IS IN OFFICE SEEING SCOTT

## 2024-03-02 NOTE — Telephone Encounter (Signed)
 Eliquis  2.5 mg by mouth twice daily for 28 is given to patient.

## 2024-03-05 ENCOUNTER — Other Ambulatory Visit (HOSPITAL_COMMUNITY): Payer: Self-pay

## 2024-03-09 ENCOUNTER — Other Ambulatory Visit: Payer: Self-pay

## 2024-03-09 ENCOUNTER — Other Ambulatory Visit (HOSPITAL_COMMUNITY): Payer: Self-pay

## 2024-03-09 MED ORDER — DICYCLOMINE HCL 10 MG PO CAPS
10.0000 mg | ORAL_CAPSULE | Freq: Three times a day (TID) | ORAL | 3 refills | Status: AC | PRN
  Filled 2024-03-09: qty 270, 90d supply, fill #0
  Filled 2024-06-15: qty 270, 90d supply, fill #1

## 2024-03-09 MED ORDER — PANTOPRAZOLE SODIUM 40 MG PO TBEC
40.0000 mg | DELAYED_RELEASE_TABLET | Freq: Two times a day (BID) | ORAL | 3 refills | Status: AC
  Filled 2024-03-09: qty 180, 90d supply, fill #0
  Filled 2024-06-15: qty 180, 90d supply, fill #1

## 2024-03-09 MED ORDER — VENLAFAXINE HCL ER 37.5 MG PO CP24
37.5000 mg | ORAL_CAPSULE | Freq: Every day | ORAL | 3 refills | Status: AC
  Filled 2024-03-09: qty 90, 90d supply, fill #0
  Filled 2024-06-15: qty 90, 90d supply, fill #1

## 2024-03-09 MED ORDER — FAMOTIDINE 20 MG PO TABS
20.0000 mg | ORAL_TABLET | Freq: Every day | ORAL | 3 refills | Status: AC | PRN
  Filled 2024-03-09: qty 90, 90d supply, fill #0
  Filled 2024-06-24: qty 90, 90d supply, fill #1

## 2024-03-09 MED ORDER — MONTELUKAST SODIUM 10 MG PO TABS
10.0000 mg | ORAL_TABLET | Freq: Every evening | ORAL | 3 refills | Status: AC | PRN
  Filled 2024-03-09: qty 90, 90d supply, fill #0
  Filled 2024-06-15: qty 90, 90d supply, fill #1

## 2024-03-09 MED ORDER — FAMOTIDINE 40 MG PO TABS
40.0000 mg | ORAL_TABLET | Freq: Two times a day (BID) | ORAL | 3 refills | Status: DC | PRN
  Filled 2024-03-09: qty 180, 90d supply, fill #0

## 2024-03-09 MED ORDER — CEFADROXIL 500 MG PO CAPS
500.0000 mg | ORAL_CAPSULE | Freq: Every day | ORAL | 0 refills | Status: DC
  Filled 2024-03-09 (×2): qty 7, 7d supply, fill #0

## 2024-03-09 MED ORDER — CEFADROXIL 500 MG PO CAPS
500.0000 mg | ORAL_CAPSULE | Freq: Two times a day (BID) | ORAL | 0 refills | Status: DC
  Filled 2024-03-09: qty 14, 7d supply, fill #0
  Filled ????-??-??: fill #0

## 2024-03-09 MED ORDER — GABAPENTIN 300 MG PO CAPS
300.0000 mg | ORAL_CAPSULE | Freq: Three times a day (TID) | ORAL | 3 refills | Status: DC | PRN
  Filled 2024-03-09: qty 270, 90d supply, fill #0
  Filled 2024-06-15: qty 270, 90d supply, fill #1

## 2024-03-16 ENCOUNTER — Other Ambulatory Visit: Payer: Self-pay

## 2024-03-16 ENCOUNTER — Other Ambulatory Visit (HOSPITAL_COMMUNITY): Payer: Self-pay

## 2024-03-16 MED ORDER — EZETIMIBE 10 MG PO TABS
10.0000 mg | ORAL_TABLET | Freq: Every day | ORAL | 3 refills | Status: AC
  Filled 2024-06-15: qty 90, 90d supply, fill #0

## 2024-03-16 MED ORDER — TIZANIDINE HCL 4 MG PO TABS
4.0000 mg | ORAL_TABLET | Freq: Two times a day (BID) | ORAL | 3 refills | Status: AC | PRN
  Filled 2024-03-16 (×3): qty 180, 90d supply, fill #0
  Filled 2024-06-24: qty 180, 90d supply, fill #1

## 2024-03-16 MED ORDER — LANTUS SOLOSTAR 100 UNIT/ML ~~LOC~~ SOPN
10.0000 [IU] | PEN_INJECTOR | Freq: Every day | SUBCUTANEOUS | 3 refills | Status: DC
  Filled 2024-03-16: qty 3, 30d supply, fill #0

## 2024-03-16 MED ORDER — AMLODIPINE BESYLATE 10 MG PO TABS
10.0000 mg | ORAL_TABLET | Freq: Every morning | ORAL | 3 refills | Status: DC
  Filled 2024-03-16: qty 90, 90d supply, fill #0

## 2024-03-16 MED ORDER — NITROGLYCERIN 0.4 MG SL SUBL
0.4000 mg | SUBLINGUAL_TABLET | SUBLINGUAL | 3 refills | Status: AC
  Filled 2024-03-16: qty 25, 10d supply, fill #0

## 2024-03-16 MED ORDER — ATORVASTATIN CALCIUM 80 MG PO TABS: 80.0000 mg | ORAL_TABLET | Freq: Every day | ORAL | 3 refills | Status: DC

## 2024-03-16 MED ORDER — CLOPIDOGREL BISULFATE 75 MG PO TABS
75.0000 mg | ORAL_TABLET | Freq: Every morning | ORAL | 3 refills | Status: AC
  Filled 2024-06-15: qty 90, 90d supply, fill #0

## 2024-03-16 MED ORDER — NITROFURANTOIN MACROCRYSTAL 50 MG PO CAPS
50.0000 mg | ORAL_CAPSULE | Freq: Every day | ORAL | 3 refills | Status: DC
  Filled 2024-03-16: qty 90, 90d supply, fill #0
  Filled 2024-06-24: qty 90, 90d supply, fill #1

## 2024-03-16 MED ORDER — PEN NEEDLES 32G X 4 MM MISC
1.0000 | Freq: Every day | 3 refills | Status: AC
  Filled 2024-03-16: qty 100, 90d supply, fill #0
  Filled 2024-09-10: qty 100, 30d supply, fill #1

## 2024-03-17 ENCOUNTER — Telehealth: Payer: Self-pay

## 2024-03-17 ENCOUNTER — Other Ambulatory Visit (HOSPITAL_COMMUNITY): Payer: Self-pay

## 2024-03-17 MED ORDER — ACCU-CHEK SOFTCLIX LANCETS MISC
Freq: Two times a day (BID) | 3 refills | Status: AC
  Filled 2024-03-17 – 2024-03-19 (×2): qty 100, 50d supply, fill #0

## 2024-03-17 MED ORDER — ASSURE PRISM MULTI TEST VI STRP
ORAL_STRIP | Freq: Two times a day (BID) | 3 refills | Status: AC
  Filled 2024-03-17 – 2024-03-19 (×2): qty 100, 50d supply, fill #0

## 2024-03-17 NOTE — Telephone Encounter (Signed)
   Pre-operative Risk Assessment    Patient Name: [name] [name]  DOB: 1941-03-27 MRN: [id number]   Date of last office visit: 03/02/24 Date of next office visit: 05/29/24   Request for Surgical Clearance    Procedure:  Left Reverse total shoulder arthroplasty   Date of Surgery:  Clearance TBD                                 Surgeon:  Dr. Franky Pointer  Surgeon's Group or Practice Name:  [facility]  Phone number:  [phone] Fax number:  [phone]   Type of Clearance Requested:   - Medical  - Pharmacy:  Hold Clopidogrel  (Plavix ) and Apixaban  (Eliquis ) Not indicated    Type of Anesthesia:  General    Additional requests/questions:    Bonney Rebeca Blight   03/17/2024, 5:19 PM

## 2024-03-18 NOTE — Telephone Encounter (Signed)
 Pharmacy please advise on holding Eliquis  prior to left shoulder artrhoplasty scheduled for TBD. Thank you.

## 2024-03-19 ENCOUNTER — Other Ambulatory Visit: Payer: Self-pay

## 2024-03-19 ENCOUNTER — Other Ambulatory Visit (HOSPITAL_COMMUNITY): Payer: Self-pay

## 2024-03-19 MED ORDER — ACCU-CHEK GUIDE TEST VI STRP
1.0000 | ORAL_STRIP | Freq: Every day | 3 refills | Status: AC
  Filled 2024-03-19: qty 100, 100d supply, fill #0

## 2024-03-19 MED ORDER — ACCU-CHEK GUIDE W/DEVICE KIT
1.0000 | PACK | Freq: Every day | 0 refills | Status: AC
  Filled 2024-03-19 (×2): qty 1, 30d supply, fill #0

## 2024-03-19 MED ORDER — ACCU-CHEK SOFTCLIX LANCETS MISC
1.0000 | Freq: Every day | 3 refills | Status: AC
  Filled 2024-03-19: qty 100, 100d supply, fill #0

## 2024-03-20 ENCOUNTER — Telehealth: Payer: Self-pay | Admitting: Licensed Clinical Social Worker

## 2024-03-20 NOTE — Telephone Encounter (Signed)
 H&V Care Navigation CSW Progress Note  Clinical Social Worker met with patient to f/u on questions regarding Extra Help application as pt and pt daughter Rojelio arrived as walk in today in clinic. Pt and daughter shared that they were told by the caseworker in the hospital that we would do everything for them over here. Shared the best next step to check eligibility for Extra Help and other programs is to reach out to [facility] and their [facility] team.  Pt was provided with this information, they left and called [employer] and came back to front desk stating the person who answered didn't have any idea what we were talking about. LCSW apologized for confusion, stated when she called to ask to speak with Chris/leave a voicemail. I also left a voicemail for [place] MATHEWS read with pts name and Beverly's name and contact. Pt provided with my card and encouraged to call me if any other issues at this time.   Patient is participating in a Managed Medicaid Plan:  No, UHC Medicare  SDOH Screenings   Food Insecurity: No Food Insecurity (02/13/2023)  Housing: Low Risk  (02/13/2023)  Transportation Needs: No Transportation Needs (02/13/2023)  Utilities: Not At Risk (02/13/2023)  Alcohol  Screen: Low Risk  (11/03/2021)  Depression (PHQ2-9): Low Risk  (05/18/2022)  Financial Resource Strain: Low Risk  (11/03/2021)  Physical Activity: Sufficiently Active (11/03/2021)  Social Connections: Unknown (05/11/2022)   Received from [facility]  Stress: No Stress Concern Present (11/03/2021)  Tobacco Use: Low Risk  (03/02/2024)    Marit Lark, MSW, LCSW Clinical Social Worker II [facility] Heart/Vascular Care Navigation  [phone]- work cell phone (preferred)

## 2024-03-23 ENCOUNTER — Encounter (HOSPITAL_COMMUNITY): Payer: Self-pay | Admitting: Internal Medicine

## 2024-03-23 ENCOUNTER — Inpatient Hospital Stay (HOSPITAL_BASED_OUTPATIENT_CLINIC_OR_DEPARTMENT_OTHER)
Admission: EM | Admit: 2024-03-23 | Discharge: 2024-03-26 | DRG: 392 | Disposition: A | Discharge status: Home Health | Attending: Internal Medicine | Admitting: Internal Medicine

## 2024-03-23 ENCOUNTER — Telehealth: Payer: Self-pay | Admitting: Licensed Clinical Social Worker

## 2024-03-23 ENCOUNTER — Other Ambulatory Visit: Payer: Self-pay

## 2024-03-23 ENCOUNTER — Emergency Department (HOSPITAL_BASED_OUTPATIENT_CLINIC_OR_DEPARTMENT_OTHER)

## 2024-03-23 ENCOUNTER — Ambulatory Visit: Admitting: Acute Care

## 2024-03-23 DIAGNOSIS — F419 Anxiety disorder, unspecified: Secondary | ICD-10-CM | POA: Diagnosis present

## 2024-03-23 DIAGNOSIS — F32A Depression, unspecified: Secondary | ICD-10-CM | POA: Diagnosis present

## 2024-03-23 DIAGNOSIS — Z8673 Personal history of transient ischemic attack (TIA), and cerebral infarction without residual deficits: Secondary | ICD-10-CM

## 2024-03-23 DIAGNOSIS — K529 Noninfective gastroenteritis and colitis, unspecified: Secondary | ICD-10-CM | POA: Diagnosis present

## 2024-03-23 DIAGNOSIS — Z881 Allergy status to other antibiotic agents status: Secondary | ICD-10-CM

## 2024-03-23 DIAGNOSIS — N3001 Acute cystitis with hematuria: Principal | ICD-10-CM | POA: Diagnosis present

## 2024-03-23 DIAGNOSIS — E1140 Type 2 diabetes mellitus with diabetic neuropathy, unspecified: Secondary | ICD-10-CM | POA: Diagnosis present

## 2024-03-23 DIAGNOSIS — E1122 Type 2 diabetes mellitus with diabetic chronic kidney disease: Secondary | ICD-10-CM | POA: Diagnosis present

## 2024-03-23 DIAGNOSIS — R0681 Apnea, not elsewhere classified: Secondary | ICD-10-CM | POA: Diagnosis present

## 2024-03-23 DIAGNOSIS — E782 Mixed hyperlipidemia: Secondary | ICD-10-CM | POA: Diagnosis present

## 2024-03-23 DIAGNOSIS — Z743 Need for continuous supervision: Secondary | ICD-10-CM | POA: Diagnosis not present

## 2024-03-23 DIAGNOSIS — Z96652 Presence of left artificial knee joint: Secondary | ICD-10-CM | POA: Diagnosis present

## 2024-03-23 DIAGNOSIS — Z66 Do not resuscitate: Secondary | ICD-10-CM | POA: Diagnosis present

## 2024-03-23 DIAGNOSIS — Z7902 Long term (current) use of antithrombotics/antiplatelets: Secondary | ICD-10-CM

## 2024-03-23 DIAGNOSIS — Z90710 Acquired absence of both cervix and uterus: Secondary | ICD-10-CM

## 2024-03-23 DIAGNOSIS — R110 Nausea: Secondary | ICD-10-CM | POA: Diagnosis not present

## 2024-03-23 DIAGNOSIS — I252 Old myocardial infarction: Secondary | ICD-10-CM

## 2024-03-23 DIAGNOSIS — B965 Pseudomonas (aeruginosa) (mallei) (pseudomallei) as the cause of diseases classified elsewhere: Secondary | ICD-10-CM | POA: Diagnosis present

## 2024-03-23 DIAGNOSIS — N1832 Chronic kidney disease, stage 3b: Secondary | ICD-10-CM | POA: Diagnosis present

## 2024-03-23 DIAGNOSIS — K219 Gastro-esophageal reflux disease without esophagitis: Secondary | ICD-10-CM | POA: Diagnosis present

## 2024-03-23 DIAGNOSIS — I5032 Chronic diastolic (congestive) heart failure: Secondary | ICD-10-CM | POA: Diagnosis present

## 2024-03-23 DIAGNOSIS — I480 Paroxysmal atrial fibrillation: Secondary | ICD-10-CM | POA: Diagnosis present

## 2024-03-23 DIAGNOSIS — I130 Hypertensive heart and chronic kidney disease with heart failure and stage 1 through stage 4 chronic kidney disease, or unspecified chronic kidney disease: Secondary | ICD-10-CM | POA: Diagnosis present

## 2024-03-23 DIAGNOSIS — Z7901 Long term (current) use of anticoagulants: Secondary | ICD-10-CM

## 2024-03-23 DIAGNOSIS — R188 Other ascites: Secondary | ICD-10-CM | POA: Diagnosis present

## 2024-03-23 DIAGNOSIS — I2510 Atherosclerotic heart disease of native coronary artery without angina pectoris: Secondary | ICD-10-CM | POA: Diagnosis present

## 2024-03-23 DIAGNOSIS — Z8249 Family history of ischemic heart disease and other diseases of the circulatory system: Secondary | ICD-10-CM

## 2024-03-23 DIAGNOSIS — A09 Infectious gastroenteritis and colitis, unspecified: Secondary | ICD-10-CM | POA: Diagnosis not present

## 2024-03-23 DIAGNOSIS — R3914 Feeling of incomplete bladder emptying: Secondary | ICD-10-CM | POA: Diagnosis not present

## 2024-03-23 DIAGNOSIS — E1165 Type 2 diabetes mellitus with hyperglycemia: Secondary | ICD-10-CM | POA: Diagnosis present

## 2024-03-23 DIAGNOSIS — Z882 Allergy status to sulfonamides status: Secondary | ICD-10-CM

## 2024-03-23 DIAGNOSIS — Z87440 Personal history of urinary (tract) infections: Secondary | ICD-10-CM

## 2024-03-23 DIAGNOSIS — Z79899 Other long term (current) drug therapy: Secondary | ICD-10-CM

## 2024-03-23 DIAGNOSIS — G894 Chronic pain syndrome: Secondary | ICD-10-CM | POA: Diagnosis present

## 2024-03-23 DIAGNOSIS — Z955 Presence of coronary angioplasty implant and graft: Secondary | ICD-10-CM

## 2024-03-23 LAB — GLUCOSE, CAPILLARY
Glucose-Capillary: 203 mg/dL — ABNORMAL HIGH (ref 70–99)
Glucose-Capillary: 163 mg/dL — ABNORMAL HIGH (ref 70–99)

## 2024-03-23 LAB — URINALYSIS, ROUTINE W REFLEX MICROSCOPIC
Bacteria, UA: NONE SEEN
Bilirubin Urine: NEGATIVE
Glucose, UA: NEGATIVE mg/dL
Ketones, ur: NEGATIVE mg/dL
Nitrite: POSITIVE — AB
Specific Gravity, Urine: 1.012 (ref 1.005–1.030)
WBC, UA: >50 WBC/hpf (ref 0–5)
pH: 6 (ref 5.0–8.0)

## 2024-03-23 LAB — CBC WITH DIFFERENTIAL/PLATELET
Abs Immature Granulocytes: 0.07 K/uL (ref 0.00–0.07)
Basophils Absolute: 0.1 K/uL (ref 0.0–0.1)
Basophils Relative: 0 %
Eosinophils Absolute: 0.1 K/uL (ref 0.0–0.5)
Eosinophils Relative: 1 %
HCT: 44.9 % (ref 36.0–46.0)
Hemoglobin: 14.7 g/dL (ref 12.0–15.0)
Immature Granulocytes: 0 %
Lymphocytes Relative: 13 %
Lymphs Abs: 2.2 K/uL (ref 0.7–4.0)
MCH: 30.9 pg (ref 26.0–34.0)
MCHC: 32.7 g/dL (ref 30.0–36.0)
MCV: 94.5 fL (ref 80.0–100.0)
Monocytes Absolute: 1.3 K/uL — ABNORMAL HIGH (ref 0.1–1.0)
Monocytes Relative: 8 %
Neutro Abs: 12.9 K/uL — ABNORMAL HIGH (ref 1.7–7.7)
Neutrophils Relative %: 78 %
Platelets: 469 K/uL — ABNORMAL HIGH (ref 150–400)
RBC: 4.75 MIL/uL (ref 3.87–5.11)
RDW: 13.8 % (ref 11.5–15.5)
WBC: 16.6 K/uL — ABNORMAL HIGH (ref 4.0–10.5)
nRBC: 0 % (ref 0.0–0.2)

## 2024-03-23 LAB — COMPREHENSIVE METABOLIC PANEL WITH GFR
ALT: 26 U/L (ref 0–44)
AST: 19 U/L (ref 15–41)
Albumin: 4.2 g/dL (ref 3.5–5.0)
Alkaline Phosphatase: 91 U/L (ref 38–126)
Anion gap: 11 (ref 5–15)
BUN: 21 mg/dL (ref 8–23)
CO2: 28 mmol/L (ref 22–32)
Calcium: 9.6 mg/dL (ref 8.9–10.3)
Chloride: 98 mmol/L (ref 98–111)
Creatinine, Ser: 0.92 mg/dL (ref 0.44–1.00)
GFR, Estimated: >60 mL/min (ref >60)
Glucose, Bld: 210 mg/dL — ABNORMAL HIGH (ref 70–99)
Potassium: 4.5 mmol/L (ref 3.5–5.1)
Sodium: 137 mmol/L (ref 135–145)
Total Bilirubin: 0.7 mg/dL (ref 0.0–1.2)
Total Protein: 7.4 g/dL (ref 6.5–8.1)

## 2024-03-23 LAB — TROPONIN T, HIGH SENSITIVITY: Troponin T High Sensitivity: <15 ng/L (ref <19)

## 2024-03-23 LAB — LIPASE, BLOOD: Lipase: 23 U/L (ref 11–51)

## 2024-03-23 MED ORDER — APIXABAN 2.5 MG PO TABS
2.5000 mg | ORAL_TABLET | Freq: Two times a day (BID) | ORAL | Status: DC
  Administered 2024-03-23 – 2024-03-26 (×6): 2.5 mg via ORAL
  Filled 2024-03-23 (×5): qty 1

## 2024-03-23 MED ORDER — LACTATED RINGERS IV SOLN: INTRAVENOUS | Status: AC

## 2024-03-23 MED ORDER — IOHEXOL 350 MG/ML SOLN
100.0000 mL | Freq: Once | INTRAVENOUS | Status: AC | PRN
  Administered 2024-03-23: 75 mL via INTRAVENOUS

## 2024-03-23 MED ORDER — HYDROMORPHONE HCL 1 MG/ML IJ SOLN
0.5000 mg | INTRAMUSCULAR | Status: DC | PRN
  Administered 2024-03-23: 1 mg via INTRAVENOUS
  Filled 2024-03-23: qty 1

## 2024-03-23 MED ORDER — INSULIN ASPART 100 UNIT/ML IJ SOLN
0.0000 [IU] | Freq: Three times a day (TID) | INTRAMUSCULAR | Status: DC
  Administered 2024-03-24: 8 [IU] via SUBCUTANEOUS
  Administered 2024-03-25 (×3): 3 [IU] via SUBCUTANEOUS
  Administered 2024-03-26: 2 [IU] via SUBCUTANEOUS

## 2024-03-23 MED ORDER — ACETAMINOPHEN 325 MG PO TABS: 650.0000 mg | ORAL_TABLET | Freq: Four times a day (QID) | ORAL | Status: DC | PRN

## 2024-03-23 MED ORDER — SODIUM CHLORIDE 0.9 % IV SOLN
1.0000 g | INTRAVENOUS | Status: DC
  Administered 2024-03-23: 1 g via INTRAVENOUS
  Filled 2024-03-23: qty 10

## 2024-03-23 MED ORDER — OXYCODONE HCL 5 MG PO TABS
5.0000 mg | ORAL_TABLET | ORAL | Status: DC | PRN
  Administered 2024-03-23 – 2024-03-25 (×3): 5 mg via ORAL
  Filled 2024-03-23 (×3): qty 1

## 2024-03-23 MED ORDER — ATORVASTATIN CALCIUM 40 MG PO TABS
80.0000 mg | ORAL_TABLET | Freq: Every day | ORAL | Status: DC
  Administered 2024-03-23 – 2024-03-26 (×4): 80 mg via ORAL
  Filled 2024-03-23 (×4): qty 2

## 2024-03-23 MED ORDER — GABAPENTIN 300 MG PO CAPS
300.0000 mg | ORAL_CAPSULE | Freq: Three times a day (TID) | ORAL | Status: DC
  Administered 2024-03-23 – 2024-03-26 (×8): 300 mg via ORAL
  Filled 2024-03-23 (×8): qty 1

## 2024-03-23 MED ORDER — ACETAMINOPHEN 650 MG RE SUPP: 650.0000 mg | Freq: Four times a day (QID) | RECTAL | Status: DC | PRN

## 2024-03-23 MED ORDER — CLOPIDOGREL BISULFATE 75 MG PO TABS
75.0000 mg | ORAL_TABLET | Freq: Every morning | ORAL | Status: DC
  Administered 2024-03-24 – 2024-03-26 (×3): 75 mg via ORAL
  Filled 2024-03-23 (×3): qty 1

## 2024-03-23 MED ORDER — VENLAFAXINE HCL ER 37.5 MG PO CP24
37.5000 mg | ORAL_CAPSULE | Freq: Every day | ORAL | Status: DC
Start: 2024-03-23 — End: 2024-03-26
  Administered 2024-03-23 – 2024-03-26 (×4): 37.5 mg via ORAL
  Filled 2024-03-23 (×4): qty 1

## 2024-03-23 MED ORDER — SODIUM CHLORIDE 0.9 % IV SOLN
2.0000 g | Freq: Once | INTRAVENOUS | Status: AC
  Administered 2024-03-23: 2 g via INTRAVENOUS
  Filled 2024-03-23: qty 12.5

## 2024-03-23 MED ORDER — ONDANSETRON HCL 4 MG PO TABS: 4.0000 mg | ORAL_TABLET | Freq: Four times a day (QID) | ORAL | Status: DC | PRN

## 2024-03-23 MED ORDER — INSULIN ASPART 100 UNIT/ML IJ SOLN: 0.0000 [IU] | Freq: Every day | INTRAMUSCULAR | Status: DC

## 2024-03-23 MED ORDER — KETOROLAC TROMETHAMINE 15 MG/ML IJ SOLN
15.0000 mg | Freq: Once | INTRAMUSCULAR | Status: AC
  Administered 2024-03-23: 15 mg via INTRAVENOUS
  Filled 2024-03-23: qty 1

## 2024-03-23 MED ORDER — ONDANSETRON HCL 4 MG/2ML IJ SOLN
4.0000 mg | Freq: Once | INTRAMUSCULAR | Status: AC
  Administered 2024-03-23: 4 mg via INTRAVENOUS
  Filled 2024-03-23: qty 2

## 2024-03-23 MED ORDER — INSULIN ASPART 100 UNIT/ML IJ SOLN: 0.0000 [IU] | INTRAMUSCULAR | Status: DC

## 2024-03-23 MED ORDER — DIPHENHYDRAMINE HCL 50 MG/ML IJ SOLN
12.5000 mg | Freq: Once | INTRAMUSCULAR | Status: AC
  Administered 2024-03-23: 12.5 mg via INTRAVENOUS
  Filled 2024-03-23: qty 1

## 2024-03-23 MED ORDER — ONDANSETRON HCL 4 MG/2ML IJ SOLN
4.0000 mg | Freq: Four times a day (QID) | INTRAMUSCULAR | Status: DC | PRN
  Administered 2024-03-23 – 2024-03-25 (×3): 4 mg via INTRAVENOUS
  Filled 2024-03-23 (×3): qty 2

## 2024-03-23 MED ORDER — ALBUTEROL SULFATE (2.5 MG/3ML) 0.083% IN NEBU: 2.5000 mg | INHALATION_SOLUTION | RESPIRATORY_TRACT | Status: DC | PRN

## 2024-03-23 NOTE — Telephone Encounter (Signed)
 Patient with diagnosis of atrial fibrillation  on Eliquis  for anticoagulation.    Procedure:  Left Reverse total shoulder arthroplasty    Date of Surgery:  Clearance TBD    CHA2DS2-VASc Score = 6   This indicates a 9.7% annual risk of stroke. The patient's score is based upon: CHF History: 0 HTN History: 1 Diabetes History: 1 Stroke History: 0 Vascular Disease History: 1 Age Score: 2 Gender Score: 1   Chart indicates history of prior stroke, found on scan.  All brain scans recently note no indication of stroke  CrCl 60 Platelet count 412  Patient has not had an Afib/aflutter ablation within the last 3 months or DCCV within the last 30 days  Per office protocol, patient can hold Eliquis  for 3 days prior to procedure.   Patient will not need bridging with Lovenox  (enoxaparin ) around procedure.  **This guidance is not considered finalized until pre-operative APP has relayed final recommendations.**

## 2024-03-23 NOTE — ED Notes (Signed)
Lauren with cl called for transport 

## 2024-03-23 NOTE — ED Notes (Signed)
 Report given to the Floor RN.Marland KitchenMarland Kitchen

## 2024-03-23 NOTE — H&P (Signed)
 History and Physical  [name] [name] FMW:[id number] DOB: 1941/06/05 DOA: 03/23/2024  PCP: Arloa Jarvis, NP   Chief Complaint: Abdominal pain, dysuria  HPI: [name] [name] is a 83 y.o. female with medical history significant for CAD, bacterial endocarditis, hypertension, hyperlipidemia, insulin -dependent type 2 diabetes recent hospitalization at [facility] for sepsis due to UTI being admitted to the hospital with 3 days of abdominal pain, nausea and dysuria found to have UTI as well as enteritis.  History is provided by the patient, who states that she had intermittent lower abdominal pain for the last 3 days, with associated emesis x 1 a couple of days ago, she has also had frequency of urination and dysuria, with sensation of incomplete bladder emptying.  Denies any fever.  She has been having normal bowel movements, last 1 was this morning.  Denies any evidence of blood loss.  Workup in the emergency department as detailed below shows evidence of enteritis, urinalysis concerning for UTI.  She was given a dose of empiric IV cefepime , and admitted to the hospitalist service.  Review of Systems: Please see HPI for pertinent positives and negatives. A complete 10 system review of systems are otherwise negative.  Past Medical History:  Diagnosis Date   Allergy    Anal fissure    Anxiety    CAD (coronary artery disease)    s/p inf MI 2007 - tx w/ DES to RCA // s/p POBA to [facility] and DES to dRCA in 11/18 ([facility] in [place], [place]) // Myoview  3/21: low risk // s/p DES to pRCA   Cataract    removed both eyes   Cerebrovascular disease    Carotid US  09/2017 [facility]): mild plaque (<50%) in both carotid arteries   Chronic neck pain    Chronic pain syndrome    Diabetes Mellitus, Type 2    Diverticular disease    DJD (degenerative joint disease)    Echocardiogram 10/2018    Echo 10/2018: EF 55-60, normal RVSF, mod MAC, mod TR, severe AoV calcification and sclerosis with nodular calcium /mobile area of  calcium  in the LVOT (small veg vs Lambl's excrescence  - consider TEE), mild AI, mild AS (mean 11).    Echocardiogram 04/2019    Echocardiogram 04/2019: EF 60-65, basal septal hypertrophy, grade 2 diastolic dysfunction, normal wall motion, normal RV SF, mild LAE, mod MAC, trivial MR, mod sclerosis of the aortic valve with mod aortic annular calcification, thin mobile filamentous structure on ventricular side of AV likely representing Lambl's excrescence, mild AI, mild TR   Frequent UTI's    Gastroesophageal reflux disease    H/O bacterial endocarditis    Rx w IV Abxs in 2020 ([facility] in [place], [place]) // F/u echo with AoV Lambl's excrescence   H/O hiatal hernia    HTN (hypertension)    Hx of fall 10/2020   Hx of MI 2007   Hx of Stroke    IBS (irritable bowel syndrome)    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 (Cervical Fusion)   Premature ventricular contractions    Tubular adenoma of colon    Ulcer    [facility] [place] [facility]  Urinary incontinence    Past Surgical History:  Procedure Laterality Date   APPENDECTOMY  1955   BLADDER REPAIR  2007   BRONCHIAL BIOPSY  05/17/2022   Procedure: BRONCHIAL BIOPSIES;  Surgeon: Gladis Leonor HERO, MD;  Location: [facility] ENDOSCOPY;  Service: Pulmonary;;   BRONCHIAL BRUSHINGS  05/17/2022   Procedure: BRONCHIAL BRUSHINGS;  Surgeon: Gladis Leonor HERO, MD;  Location: [facility] ENDOSCOPY;  Service: Pulmonary;;   BRONCHIAL NEEDLE ASPIRATION BIOPSY  05/17/2022   Procedure: BRONCHIAL NEEDLE ASPIRATION BIOPSIES;  Surgeon: Gladis Leonor HERO, MD;  Location: [facility] ENDOSCOPY;  Service: Pulmonary;;   CARDIAC CATHETERIZATION  2007   Stents   CERVICAL SPINE SURGERY  07/2005   Dr Armen    CHOLECYSTECTOMY  2007   COLONOSCOPY     CORONARY PRESSURE/FFR WITH 3D MAPPING N/A 06/28/2021   Procedure: Coronary Pressure Wire/FFR w/3D Mapping;  Surgeon: Verlin Lonni BIRCH, MD;  Location: [facility] INVASIVE CV LAB;  Service: Cardiovascular;  Laterality: N/A;   CORONARY STENT INTERVENTION N/A 03/09/2020   Procedure: CORONARY STENT INTERVENTION;  Surgeon: Verlin Lonni BIRCH, MD;  Location: [facility] INVASIVE CV LAB;  Service: Cardiovascular;  Laterality: N/A;   CORONARY STENT INTERVENTION N/A 06/28/2021   Procedure: CORONARY STENT INTERVENTION;  Surgeon: Verlin Lonni BIRCH, MD;  Location: [facility] INVASIVE CV LAB;  Service: Cardiovascular;  Laterality: N/A;   DENTAL SURGERY  05/2013   replaced an inplant   EYE SURGERY  2011   Bilteral   FIDUCIAL MARKER PLACEMENT  05/17/2022   Procedure: FIDUCIAL MARKER PLACEMENT;  Surgeon: Gladis Leonor HERO, MD;  Location: [facility] ENDOSCOPY;  Service: Pulmonary;;  LUL   I & D KNEE WITH POLY EXCHANGE  09/25/2011   Procedure: IRRIGATION AND DEBRIDEMENT KNEE WITH POLY EXCHANGE;  Surgeon: Fonda SHAUNNA Olmsted, MD;  Location: [facility] OR;  Service: Orthopedics;  Laterality: Left;   INCISION AND DRAINAGE ABSCESS / HEMATOMA OF BURSA / KNEE / THIGH  09/2011   JOINT REPLACEMENT  2012   left   LEFT HEART CATH AND CORONARY ANGIOGRAPHY N/A 03/09/2020   Procedure: LEFT HEART CATH AND CORONARY ANGIOGRAPHY;  Surgeon: Verlin Lonni BIRCH, MD;  Location: [facility] INVASIVE CV LAB;  Service: Cardiovascular;  Laterality: N/A;   LEFT HEART CATH AND CORONARY ANGIOGRAPHY N/A 06/28/2021   Procedure: LEFT HEART CATH AND CORONARY ANGIOGRAPHY;  Surgeon: Verlin Lonni BIRCH, MD;  Location: [facility] INVASIVE CV LAB;  Service: Cardiovascular;  Laterality: N/A;   left Total Knee Replacement  11/2010   Dr Beverley   NECK SURGERY     has had 3 surgeries   POLYPECTOMY     POSTERIOR CERVICAL FUSION/FORAMINOTOMY  04/08/2012   Procedure: POSTERIOR CERVICAL FUSION/FORAMINOTOMY LEVEL 2;  Surgeon: Lynwood JONELLE Mill, MD;  Location:  [facility] NEURO ORS;  Service: Neurosurgery;  Laterality: Left;  Left Cervical six-seven Foraminotomy, bilateral cervical seven-thoracic one foraminotomy, cervical six-seven, cervical seven-thoracic one fusion with posterior instrumentation   TEMPOROMANDIBULAR JOINT SURGERY     thumb surgery     TONSILLECTOMY     TONSILLECTOMY  1950   TOTAL ABDOMINAL HYSTERECTOMY     VIDEO BRONCHOSCOPY WITH RADIAL ENDOBRONCHIAL ULTRASOUND  05/17/2022   Procedure: VIDEO BRONCHOSCOPY WITH RADIAL ENDOBRONCHIAL ULTRASOUND;  Surgeon: Gladis Leonor HERO, MD;  Location: [facility] ENDOSCOPY;  Service: Pulmonary;;   Social History:  reports that she has never smoked. She has never been exposed to tobacco smoke. She has never used smokeless tobacco. She reports that she does not drink alcohol  and does not use drugs.  Allergies  Allergen Reactions   Sulfa   Antibiotics Itching   Niacin  And Related Other (See Comments)    Must take Flush-free   Codeine Nausea Only   Macrobid  [Nitrofurantoin ] Nausea Only    Severe nausea   Prednisone Itching and Rash   Rocephin  [Ceftriaxone ] Itching    Has taken cefepime  without issue   Sulfonamide Derivatives Itching   Tetracycline Itching and Rash    Family History  Problem Relation Age of Onset   Heart disease Mother    Pancreatic cancer Mother    Colon polyps Sister    Pancreatitis Sister    Lung cancer Brother    Cancer - Other Brother        vocal cord cancer   Coronary artery disease Other        sibling   Coronary artery disease Other        sibling   Colon cancer Neg Hx    Esophageal cancer Neg Hx    Rectal cancer Neg Hx    Stomach cancer Neg Hx      Prior to Admission medications   Medication Sig Start Date End Date Taking? Authorizing Provider  Accu-Chek Softclix Lancets lancets Use to monitor Blood Sugars daily. 03/18/24     acetaminophen  (TYLENOL ) 325 MG tablet Take 650 mg by mouth every 6 (six) hours as needed for moderate pain.    [provider]   amLODipine  (NORVASC ) 10 MG tablet Take 1 tablet (10 mg total) by mouth in the morning for high blood pressure. 03/16/24     amLODipine  (NORVASC ) 5 MG tablet Take 1 tablet (5 mg total) by mouth daily. 03/02/24 06/07/24  Lelon Hamilton T, PA-C  apixaban  (ELIQUIS ) 2.5 MG TABS tablet Take 1 tablet (2.5 mg total) by mouth 2 (two) times daily. 03/02/24   Lelon Hamilton T, PA-C  apixaban  (ELIQUIS ) 2.5 MG TABS tablet Take 1 tablet (2.5 mg total) by mouth 2 (two) times daily. 03/02/24   Lelon Hamilton T, PA-C  Ascorbic Acid  (VITAMIN C ) 500 MG CAPS Take 500 mg by mouth in the morning and at bedtime.    [provider]  atorvastatin  (LIPITOR ) 80 MG tablet Take 1 tablet (80 mg total) by mouth at bedtime. 03/02/24   Lelon Hamilton T, PA-C  atorvastatin  (LIPITOR ) 80 MG tablet Take 1 tablet (80 mg total) by mouth at bedtime for high cholesterol. 03/16/24     Blood Glucose Monitoring Suppl (ACCU-CHEK GUIDE) w/Device KIT Use to monitor Blood Sugars daily. 03/18/24     cefadroxil  (DURICEF) 500 MG capsule Take 1 capsule (500 mg total) by mouth 2 (two) times daily. 02/19/24   Singh, Prashant K, MD  cefadroxil  (DURICEF) 500 MG capsule Take 1 capsule (500 mg total) by mouth daily for 7 days for UTI 03/09/24     Cinnamon 500 MG capsule Take 2 capsules twice a day by oral route as directed.    [provider]  clopidogrel  (PLAVIX ) 75 MG tablet Take 1 tablet (75 mg total) by mouth every morning. 03/02/24   Lelon Hamilton T, PA-C  clopidogrel  (PLAVIX ) 75 MG tablet Take 1 tablet (75 mg total) by mouth in the morning to prevent blood clots. 03/16/24     dicyclomine  (BENTYL ) 10 MG capsule Take 10 mg by mouth 3 (three) times daily.    [provider]  dicyclomine  (BENTYL ) 10 MG capsule Take 1 capsule (10 mg) by mouth every 8 hours (up to 3 times per day) for abdominal pain/spasms as needed. 03/09/24     ezetimibe  (ZETIA ) 10 MG  tablet Take 1 tablet (10 mg total) by mouth every morning. 03/02/24   Lelon Hamilton T, PA-C   ezetimibe  (ZETIA ) 10 MG tablet Take 1 tablet (10 mg total) by mouth daily for cholesterol. 03/16/24     famotidine  (PEPCID ) 20 MG tablet Take 1 tablet (20 mg total) by mouth daily as needed for severe indigestion and heartburn 03/09/24     gabapentin  (NEURONTIN ) 300 MG capsule Take 300 mg by mouth 3 (three) times daily.    [provider]  gabapentin  (NEURONTIN ) 300 MG capsule Take 1 capsule by oral route every 8 hours (up to 3 times daily) for nerve/chronic pain. 03/09/24     glucose blood (ACCU-CHEK GUIDE TEST) test strip Use to monitor Blood Sugars daily. 03/18/24     glucose blood (ASSURE PRISM  MULTI TEST) test strip Use strips as directed to check blood sugar twice daily 03/17/24     insulin  aspart (NOVOLOG ) 100 UNIT/ML FlexPen Before each meal 3 times a day, 140-199 - 2 units, 200-250 - 4 units, 251-299 - 6 units,  300-349 - 8 units,  350 or above 10 units. 02/19/24   Singh, Prashant K, MD  insulin  glargine (LANTUS  SOLOSTAR) 100 UNIT/ML Solostar Pen Inject 10 Units into the skin daily for diabetes. 03/16/24     Insulin  Pen Needle (PEN NEEDLES) 32G X 4 MM MISC Use as directed to administer insulin  daily. 03/16/24     Accu-Chek Softclix Lancets lancets Use to check blood sugar 2 (two) times daily. 03/17/24     metoprolol  tartrate (LOPRESSOR ) 100 MG tablet Take 1 tablet (100 mg total) by mouth 2 (two) times daily. 03/02/24   Lelon Hamilton T, PA-C  montelukast  (SINGULAIR ) 10 MG tablet Take 10 mg by mouth at bedtime. 02/07/23   [provider]  montelukast  (SINGULAIR ) 10 MG tablet Take 1 tablet (10 mg total) by mouth at bedtime for allergies. 03/09/24     nitrofurantoin  (MACRODANTIN ) 50 MG capsule Take 1 capsule (50 mg total) by mouth daily for UTI prevention. 03/16/24     nitroGLYCERIN  (NITROSTAT ) 0.4 MG SL tablet Place 1 tablet (0.4 mg total) under the tongue every 5 (five) minutes as needed for chest pain. 03/02/24   Lelon Hamilton T, PA-C  nitroGLYCERIN  (NITROSTAT ) 0.4 MG SL tablet Dissolve 1  tablet under your tongue at the first sign of heart attack/chest pain; no more than 3 tablets within a 15 minute period. 03/16/24     Omega-3 Fatty Acids (FISH OIL) 1000 MG CAPS Take 1,000 mg by mouth in the morning and at bedtime.    [provider]  ondansetron  (ZOFRAN ) 4 MG tablet Take 4 mg by mouth every 8 (eight) hours as needed for vomiting or nausea. 02/07/23   [provider]  pantoprazole  (PROTONIX ) 40 MG tablet Take 40 mg by mouth 2 (two) times daily.    [provider]  pantoprazole  (PROTONIX ) 40 MG tablet Take 1 tablet (40 mg total) by mouth 2 (two) times daily for indigestion and heart burn. 03/09/24     tiZANidine  (ZANAFLEX ) 4 MG tablet Take 1 tablet by mouth twice daily as needed for chronic shoulder/neck pain. 03/16/24     venlafaxine  XR (EFFEXOR -XR) 37.5 MG 24 hr capsule Take 37.5 mg by mouth every morning. 02/07/23   [provider]  venlafaxine  XR (EFFEXOR -XR) 37.5 MG 24 hr capsule Take 1 capsule (37.5 mg total) by mouth daily for anxiety and depression. 03/09/24       Physical Exam: BP (!) 152/58 (BP Location:  Left Arm)   Pulse 71   Temp 98 F (36.7 C) (Oral)   Resp 17   SpO2 93%  General:  Alert, oriented, calm, in no acute distress, comfortable on room air.  Her daughter is at the bedside. Cardiovascular: RRR, no murmurs or rubs, no peripheral edema  Respiratory: clear to auscultation bilaterally, no wheezes, no crackles  Abdomen: soft, tender in the bilateral lower quadrants, nondistended, normal bowel tones heard  Skin: dry, no rashes  Musculoskeletal: no joint effusions, normal range of motion  Psychiatric: appropriate affect, normal speech  Neurologic: extraocular muscles intact, clear speech, moving all extremities with intact sensorium         Labs on Admission:  Basic Metabolic Panel: Recent Labs  Lab 03/23/24 0950  NA 137  K 4.5  CL 98  CO2 28  GLUCOSE 210*  BUN 21  CREATININE 0.92  CALCIUM  9.6   Liver Function  Tests: Recent Labs  Lab 03/23/24 0950  AST 19  ALT 26  ALKPHOS 91  BILITOT 0.7  PROT 7.4  ALBUMIN 4.2   Recent Labs  Lab 03/23/24 0950  LIPASE 23   No results for input(s): AMMONIA in the last 168 hours. CBC: Recent Labs  Lab 03/23/24 0950  WBC 16.6*  NEUTROABS 12.9*  HGB 14.7  HCT 44.9  MCV 94.5  PLT 469*   Cardiac Enzymes: No results for input(s): CKTOTAL, CKMB, CKMBINDEX, TROPONINI in the last 168 hours. BNP (last 3 results) Recent Labs    02/16/24 0759 02/17/24 0541 02/18/24 0457  BNP 175.5* 82.9 79.4    ProBNP (last 3 results) No results for input(s): PROBNP in the last 8760 hours.  CBG: No results for input(s): GLUCAP in the last 168 hours.  Radiological Exams on Admission: CT CHEST ABDOMEN PELVIS W CONTRAST Result Date: 03/23/2024 CLINICAL DATA:  Mid abdominal pain for the last 2-3 days. Reduced urinary output. Recent hospitalization for sepsis related to urinary tract infection. EXAM: CT CHEST, ABDOMEN, AND PELVIS WITH CONTRAST TECHNIQUE: Multidetector CT imaging of the chest, abdomen and pelvis was performed following the standard protocol during bolus administration of intravenous contrast. RADIATION DOSE REDUCTION: This exam was performed according to the departmental dose-optimization program which includes automated exposure control, adjustment of the mA and/or kV according to patient size and/or use of iterative reconstruction technique. CONTRAST:  75mL OMNIPAQUE  IOHEXOL  350 MG/ML SOLN COMPARISON:  02/12/2024 FINDINGS: CT CHEST FINDINGS Cardiovascular: Coronary, aortic arch, and branch vessel atherosclerotic vascular disease. Aortic valve calcification. Mediastinum/Nodes: Unremarkable Lungs/Pleura: Left apical pleuroparenchymal scarring and nodularity with increased internal lucency in this nodularity compared to 11/23/2022, and overall stable from 02/12/2024. Adjacent fiducial noted. Correlate with prior biopsy/workup. 1.4 by 0.8 cm  lachrymiform subpleural nodule medially in the right middle lobe on image 114 series 3, stable from 11/23/2022. This is likely benign but may merit surveillance. 0.4 cm right lower lobe nodule on image 96 series 3, no change from 11/23/2022, considered benign. Musculoskeletal: Degenerative glenohumeral arthropathy bilaterally. Cervical spine hardware noted. CT ABDOMEN PELVIS FINDINGS Hepatobiliary: Cholecystectomy.  Otherwise unremarkable. Pancreas: Unremarkable Spleen: Unremarkable Adrenals/Urinary Tract: Urinary bladder wall thickening may be related to nondistention but correlate with urine analysis. Stomach/Bowel: Loops small bowel the pelvis demonstrate bowel wall thickening and surrounding inflammatory stranding along with a small amount of ascites. Loops proximal to these thick-walled bowel loops are mildly dilated up to about 3.3 cm. Do not see a definite lead point for obstruction, transition from the thick-walled small bowel to nondistended and non thickened distal  loops is fairly gradual. Appearance favors enteritis potentially with some local ileus over obstruction although surveillance is likely indicated particularly given the degree of mesenteric edema the mild ascites. Mild sigmoid colon diverticulosis. Vascular/Lymphatic: Atherosclerosis is present, including aortoiliac atherosclerotic disease. Atheromatous plaque dorsally at the origins of the celiac trunk and SMA without critical stenosis or overt occlusion. Reproductive: Uterus absent. Other: Trace perisplenic ascites. Musculoskeletal: Right distal iliopsoas lipoma. Grade 1 anterolisthesis at L3-4 with left paracentral and foraminal disc protrusion causing moderate left foraminal stenosis. IMPRESSION: 1. Loops of small bowel in the pelvis demonstrate bowel wall thickening and surrounding inflammatory stranding along with a small amount of ascites. Loops proximal to these thick-walled bowel loops are mildly dilated up to about 3.3 cm. I do not see a  definite lead point for obstruction. Appearance favors enteritis potentially with some local ileus over obstruction although surveillance is likely indicated particularly given the degree of mesenteric edema the mild ascites. 2. Urinary bladder wall thickening may be related to nondistention but correlate with urine analysis. 3. Left apical pleuroparenchymal scarring and nodularity with increased internal lucency in this nodularity compared to 11/23/2022, and overall stable from 02/12/2024. Adjacent fiducial noted. Correlate with prior biopsy/workup. 4. 1.4 by 0.8 cm lachrymiform subpleural nodule medially in the right middle lobe, stable from 11/23/2022. This is likely benign but may merit surveillance. 5. Grade 1 anterolisthesis at L3-4 with left paracentral and foraminal disc protrusion causing moderate left foraminal stenosis. 6.  Aortic Atherosclerosis (ICD10-I70.0). Electronically Signed   By: Ryan Salvage M.D.   On: 03/23/2024 11:19   Assessment/Plan [name] [name] is a 83 y.o. female with medical history significant for CAD, bacterial endocarditis, hypertension, hyperlipidemia, insulin -dependent type 2 diabetes recent hospitalization at [name] for sepsis due to UTI being admitted to the hospital with 3 days of abdominal pain, nausea and dysuria found to have UTI as well as enteritis.   Enteritis-with abdominal pain, 1 episode of vomiting.  Patient is not constipated, I doubt ileus at this time.  No fever, doubt bacterial infection causing her enteritis. -Observation admission -P.o. diet as tolerated -Pain and nausea medication as needed  UTI-with abnormal urinalysis, dysuria.  No leukocytosis, fever or other evidence of sepsis. -Empiric IV Rocephin  -Follow-up blood and urine cultures  Type 2 diabetes-carb modified diet when eating, with moderate dose sliding scale.  It appears he is no longer on long-acting insulin .  Hyperlipidemia-atorvastatin  80  CAD-continue Plavix  75 mg p.o. daily, her  aspirin  was discontinued last month  Paroxysmal atrial fibrillation-continue Eliquis   Chronic heart failure with preserved EF-patient appears to be euvolemic on exam -Continue metoprolol   GERD-oral PPI  Neuropathy-continue home dose gabapentin   Nocturnal apnea-suspected sleep apnea, will order CPAP nightly  DVT prophylaxis: Eliquis     Code Status: Limited: Do not attempt resuscitation (DNR) -DNR-LIMITED -Do Not Intubate/DNI   Consults called: None  Admission status: Observation  Time spent: 49 minutes  [name] Bryk CHRISTELLA Gail MD [facility] Pager [phone]  If 7PM-7AM, please contact night-coverage [email or web address] Password [email or web address]  03/23/2024, 4:32 PM

## 2024-03-23 NOTE — Telephone Encounter (Signed)
 H&V Care Navigation CSW Progress Note  Clinical Social Worker sent a secure email to [facility] coordinator for [facility], [place], to f/u on Extra Help referral. [place] confirmed they were able to submit an application for pt on Friday. She should have a determination hopefully within 10 business days per her report.   Remain available as needed. Note currently pt at ED.  Patient is participating in a Managed Medicaid Plan:  No, U[employer] Medicare  SDOH Screenings   Food Insecurity: No Food Insecurity (02/13/2023)  Housing: Low Risk  (02/13/2023)  Transportation Needs: No Transportation Needs (02/13/2023)  Utilities: Not At Risk (02/13/2023)  Alcohol  Screen: Low Risk  (11/03/2021)  Depression (PHQ2-9): Low Risk  (05/18/2022)  Financial Resource Strain: Low Risk  (11/03/2021)  Physical Activity: Sufficiently Active (11/03/2021)  Social Connections: Unknown (05/11/2022)   Received from [facility]  Stress: No Stress Concern Present (11/03/2021)  Tobacco Use: Low Risk  (03/02/2024)    Marit Lark, MSW, LCSW Clinical Social Worker II [facility] Heart/Vascular Care Navigation  [phone]- work cell phone (preferred)

## 2024-03-23 NOTE — ED Triage Notes (Signed)
 Pt c/o lower mid-abdominal pain for past 2-3 nights with minimal urinary output.  Pt treated recently in hospital for sepsis r/t UTI.

## 2024-03-23 NOTE — Discharge Instructions (Signed)
   EXAM: CT CHEST, ABDOMEN, AND PELVIS WITH CONTRAST   TECHNIQUE: Multidetector CT imaging of the chest, abdomen and pelvis was performed following the standard protocol during bolus administration of intravenous contrast.   RADIATION DOSE REDUCTION: This exam was performed according to the departmental dose-optimization program which includes automated exposure control, adjustment of the mA and/or kV according to patient size and/or use of iterative reconstruction technique.   CONTRAST:  75mL OMNIPAQUE  IOHEXOL  350 MG/ML SOLN   COMPARISON:  02/12/2024   FINDINGS: CT CHEST FINDINGS   Cardiovascular: Coronary, aortic arch, and branch vessel atherosclerotic vascular disease. Aortic valve calcification.   Mediastinum/Nodes: Unremarkable   Lungs/Pleura: Left apical pleuroparenchymal scarring and nodularity with increased internal lucency in this nodularity compared to 11/23/2022, and overall stable from 02/12/2024. Adjacent fiducial noted. Correlate with prior biopsy/workup.   1.4 by 0.8 cm lachrymiform subpleural nodule medially in the right middle lobe on image 114 series 3, stable from 11/23/2022. This is likely benign but may merit surveillance.   0.4 cm right lower lobe nodule on image 96 series 3, no change from 11/23/2022, considered benign.   Musculoskeletal: Degenerative glenohumeral arthropathy bilaterally. Cervical spine hardware noted.   CT ABDOMEN PELVIS FINDINGS   Hepatobiliary: Cholecystectomy.  Otherwise unremarkable.   Pancreas: Unremarkable   Spleen: Unremarkable   Adrenals/Urinary Tract: Urinary bladder wall thickening may be related to nondistention but correlate with urine analysis.   Stomach/Bowel: Loops small bowel the pelvis demonstrate bowel wall thickening and surrounding inflammatory stranding along with a small amount of ascites. Loops proximal to these thick-walled bowel loops are mildly dilated up to about 3.3 cm. Do not see a definite  lead point for obstruction, transition from the thick-walled small bowel to nondistended and non thickened distal loops is fairly gradual. Appearance favors enteritis potentially with some local ileus over obstruction although surveillance is likely indicated particularly given the degree of mesenteric edema the mild ascites.   Mild sigmoid colon diverticulosis.   Vascular/Lymphatic: Atherosclerosis is present, including aortoiliac atherosclerotic disease. Atheromatous plaque dorsally at the origins of the celiac trunk and SMA without critical stenosis or overt occlusion.   Reproductive: Uterus absent.   Other: Trace perisplenic ascites.   Musculoskeletal: Right distal iliopsoas lipoma. Grade 1 anterolisthesis at L3-4 with left paracentral and foraminal disc protrusion causing moderate left foraminal stenosis.   IMPRESSION: 1. Loops of small bowel in the pelvis demonstrate bowel wall thickening and surrounding inflammatory stranding along with a small amount of ascites. Loops proximal to these thick-walled bowel loops are mildly dilated up to about 3.3 cm. I do not see a definite lead point for obstruction. Appearance favors enteritis potentially with some local ileus over obstruction although surveillance is likely indicated particularly given the degree of mesenteric edema the mild ascites. 2. Urinary bladder wall thickening may be related to nondistention but correlate with urine analysis. 3. Left apical pleuroparenchymal scarring and nodularity with increased internal lucency in this nodularity compared to 11/23/2022, and overall stable from 02/12/2024. Adjacent fiducial noted. Correlate with prior biopsy/workup. 4. 1.4 by 0.8 cm lachrymiform subpleural nodule medially in the right middle lobe, stable from 11/23/2022. This is likely benign but may merit surveillance. 5. Grade 1 anterolisthesis at L3-4 with left paracentral and foraminal disc protrusion causing moderate left  foraminal stenosis. 6.  Aortic Atherosclerosis (ICD10-I70.0).     Electronically Signed   By: Ryan Salvage M.D.   On: 03/23/2024 11:19

## 2024-03-23 NOTE — ED Provider Notes (Signed)
 [place] EMERGENCY DEPARTMENT AT [facility] Provider Note   CSN: [id number] Arrival date & time: 03/23/24  9083     Patient presents with: Urinary Retention   [name] [name] is a 83 y.o. female who was recently discharged from the hospital on 02/13/24 for sepsis secondary to UTI, A-fib on Eliquis , type 2 diabetes, hypertension, presents with concern for lower abdominal pain that started about 3 days ago.  States this pain is fairly constant and sometimes feels like a sharp stabbing pain. She has also had some nausea without vomiting.  Denies any fever, chills.  Denies any chest pain or shortness of breath.  Denies any dysuria or increased frequency.  Does report more difficulty getting her urine out.  Reports last bowel movement was this morning   HPI     Prior to Admission medications   Medication Sig Start Date End Date Taking? Authorizing Provider  Accu-Chek Softclix Lancets lancets Use to monitor Blood Sugars daily. 03/18/24     acetaminophen  (TYLENOL ) 325 MG tablet Take 650 mg by mouth every 6 (six) hours as needed for moderate pain.    [provider]  amLODipine  (NORVASC ) 10 MG tablet Take 1 tablet (10 mg total) by mouth in the morning for high blood pressure. 03/16/24     amLODipine  (NORVASC ) 5 MG tablet Take 1 tablet (5 mg total) by mouth daily. 03/02/24 06/07/24  Lelon Hamilton T, PA-C  apixaban  (ELIQUIS ) 2.5 MG TABS tablet Take 1 tablet (2.5 mg total) by mouth 2 (two) times daily. 03/02/24   Lelon Hamilton T, PA-C  apixaban  (ELIQUIS ) 2.5 MG TABS tablet Take 1 tablet (2.5 mg total) by mouth 2 (two) times daily. 03/02/24   Lelon Hamilton T, PA-C  Ascorbic Acid  (VITAMIN C ) 500 MG CAPS Take 500 mg by mouth in the morning and at bedtime.    [provider]  atorvastatin  (LIPITOR ) 80 MG tablet Take 1 tablet (80 mg total) by mouth at bedtime. 03/02/24   Lelon Hamilton T, PA-C  atorvastatin  (LIPITOR ) 80 MG tablet Take 1 tablet (80 mg total) by mouth at bedtime for high  cholesterol. 03/16/24     Blood Glucose Monitoring Suppl (ACCU-CHEK GUIDE) w/Device KIT Use to monitor Blood Sugars daily. 03/18/24     cefadroxil  (DURICEF) 500 MG capsule Take 1 capsule (500 mg total) by mouth 2 (two) times daily. 02/19/24   Singh, Prashant K, MD  cefadroxil  (DURICEF) 500 MG capsule Take 1 capsule (500 mg total) by mouth daily for 7 days for UTI 03/09/24     Cinnamon 500 MG capsule Take 2 capsules twice a day by oral route as directed.    [provider]  clopidogrel  (PLAVIX ) 75 MG tablet Take 1 tablet (75 mg total) by mouth every morning. 03/02/24   Lelon Hamilton T, PA-C  clopidogrel  (PLAVIX ) 75 MG tablet Take 1 tablet (75 mg total) by mouth in the morning to prevent blood clots. 03/16/24     dicyclomine  (BENTYL ) 10 MG capsule Take 10 mg by mouth 3 (three) times daily.    [provider]  dicyclomine  (BENTYL ) 10 MG capsule Take 1 capsule (10 mg) by mouth every 8 hours (up to 3 times per day) for abdominal pain/spasms as needed. 03/09/24     ezetimibe  (ZETIA ) 10 MG tablet Take 1 tablet (10 mg total) by mouth every morning. 03/02/24   Lelon Hamilton T, PA-C  ezetimibe  (ZETIA ) 10 MG tablet Take 1 tablet (10 mg total) by mouth daily for cholesterol. 03/16/24  famotidine  (PEPCID ) 20 MG tablet Take 1 tablet (20 mg total) by mouth daily as needed for severe indigestion and heartburn 03/09/24     gabapentin  (NEURONTIN ) 300 MG capsule Take 300 mg by mouth 3 (three) times daily.    [provider]  gabapentin  (NEURONTIN ) 300 MG capsule Take 1 capsule by oral route every 8 hours (up to 3 times daily) for nerve/chronic pain. 03/09/24     glucose blood (ACCU-CHEK GUIDE TEST) test strip Use to monitor Blood Sugars daily. 03/18/24     glucose blood (ASSURE PRISM  MULTI TEST) test strip Use strips as directed to check blood sugar twice daily 03/17/24     insulin  aspart (NOVOLOG ) 100 UNIT/ML FlexPen Before each meal 3 times a day, 140-199 - 2 units, 200-250 - 4 units, 251-299 - 6 units,   300-349 - 8 units,  350 or above 10 units. 02/19/24   Singh, Prashant K, MD  insulin  glargine (LANTUS  SOLOSTAR) 100 UNIT/ML Solostar Pen Inject 10 Units into the skin daily for diabetes. 03/16/24     Insulin  Pen Needle (PEN NEEDLES) 32G X 4 MM MISC Use as directed to administer insulin  daily. 03/16/24     Accu-Chek Softclix Lancets lancets Use to check blood sugar 2 (two) times daily. 03/17/24     metoprolol  tartrate (LOPRESSOR ) 100 MG tablet Take 1 tablet (100 mg total) by mouth 2 (two) times daily. 03/02/24   Lelon Hamilton T, PA-C  montelukast  (SINGULAIR ) 10 MG tablet Take 10 mg by mouth at bedtime. 02/07/23   [provider]  montelukast  (SINGULAIR ) 10 MG tablet Take 1 tablet (10 mg total) by mouth at bedtime for allergies. 03/09/24     nitrofurantoin  (MACRODANTIN ) 50 MG capsule Take 1 capsule (50 mg total) by mouth daily for UTI prevention. 03/16/24     nitroGLYCERIN  (NITROSTAT ) 0.4 MG SL tablet Place 1 tablet (0.4 mg total) under the tongue every 5 (five) minutes as needed for chest pain. 03/02/24   Lelon Hamilton T, PA-C  nitroGLYCERIN  (NITROSTAT ) 0.4 MG SL tablet Dissolve 1 tablet under your tongue at the first sign of heart attack/chest pain; no more than 3 tablets within a 15 minute period. 03/16/24     Omega-3 Fatty Acids (FISH OIL) 1000 MG CAPS Take 1,000 mg by mouth in the morning and at bedtime.    [provider]  ondansetron  (ZOFRAN ) 4 MG tablet Take 4 mg by mouth every 8 (eight) hours as needed for vomiting or nausea. 02/07/23   [provider]  pantoprazole  (PROTONIX ) 40 MG tablet Take 40 mg by mouth 2 (two) times daily.    [provider]  pantoprazole  (PROTONIX ) 40 MG tablet Take 1 tablet (40 mg total) by mouth 2 (two) times daily for indigestion and heart burn. 03/09/24     tiZANidine  (ZANAFLEX ) 4 MG tablet Take 1 tablet by mouth twice daily as needed for chronic shoulder/neck pain. 03/16/24     venlafaxine  XR (EFFEXOR -XR) 37.5 MG 24 hr capsule Take 37.5 mg by  mouth every morning. 02/07/23   [provider]  venlafaxine  XR (EFFEXOR -XR) 37.5 MG 24 hr capsule Take 1 capsule (37.5 mg total) by mouth daily for anxiety and depression. 03/09/24       Allergies: Sulfa  antibiotics, Niacin  and related, Codeine, Macrobid  [nitrofurantoin ], Prednisone, Rocephin  [ceftriaxone ], Sulfonamide derivatives, and Tetracycline    Review of Systems  Constitutional:  Negative for fever.  Gastrointestinal:  Positive for abdominal pain.    Updated Vital Signs BP (!) 150/61 (BP Location: Right  Arm)   Pulse 70   Temp 98 F (36.7 C)   Resp 16   SpO2 99%   Physical Exam Vitals and nursing note reviewed.  Constitutional:      General: She is not in acute distress.    Appearance: She is well-developed.  HENT:     Head: Normocephalic and atraumatic.  Eyes:     Conjunctiva/sclera: Conjunctivae normal.  Cardiovascular:     Rate and Rhythm: Normal rate and regular rhythm.     Heart sounds: No murmur heard. Pulmonary:     Effort: Pulmonary effort is normal. No respiratory distress.     Breath sounds: Normal breath sounds.  Abdominal:     Palpations: Abdomen is soft.     Tenderness: There is abdominal tenderness.     Comments: Diffusely tender to palpation of the abdomen with some mild guarding especially in the lower abdomen.  Musculoskeletal:        General: No swelling.     Cervical back: Neck supple.  Skin:    General: Skin is warm and dry.     Capillary Refill: Capillary refill takes less than 2 seconds.  Neurological:     Mental Status: She is alert.  Psychiatric:        Mood and Affect: Mood normal.     (all labs ordered are listed, but only abnormal results are displayed) Labs Reviewed  CBC WITH DIFFERENTIAL/PLATELET - Abnormal; Notable for the following components:      Result Value   WBC 16.6 (*)    Platelets 469 (*)    Neutro Abs 12.9 (*)    Monocytes Absolute 1.3 (*)    All other components within normal limits  COMPREHENSIVE  METABOLIC PANEL WITH GFR - Abnormal; Notable for the following components:   Glucose, Bld 210 (*)    All other components within normal limits  URINALYSIS, ROUTINE W REFLEX MICROSCOPIC - Abnormal; Notable for the following components:   APPearance HAZY (*)    Hgb urine dipstick SMALL (*)    Protein, ur TRACE (*)    Nitrite POSITIVE (*)    Leukocytes,Ua LARGE (*)    All other components within normal limits  URINE CULTURE  LIPASE, BLOOD  TROPONIN T, HIGH SENSITIVITY    EKG: None  Radiology: CT CHEST ABDOMEN PELVIS W CONTRAST Result Date: 03/23/2024 CLINICAL DATA:  Mid abdominal pain for the last 2-3 days. Reduced urinary output. Recent hospitalization for sepsis related to urinary tract infection. EXAM: CT CHEST, ABDOMEN, AND PELVIS WITH CONTRAST TECHNIQUE: Multidetector CT imaging of the chest, abdomen and pelvis was performed following the standard protocol during bolus administration of intravenous contrast. RADIATION DOSE REDUCTION: This exam was performed according to the departmental dose-optimization program which includes automated exposure control, adjustment of the mA and/or kV according to patient size and/or use of iterative reconstruction technique. CONTRAST:  75mL OMNIPAQUE  IOHEXOL  350 MG/ML SOLN COMPARISON:  02/12/2024 FINDINGS: CT CHEST FINDINGS Cardiovascular: Coronary, aortic arch, and branch vessel atherosclerotic vascular disease. Aortic valve calcification. Mediastinum/Nodes: Unremarkable Lungs/Pleura: Left apical pleuroparenchymal scarring and nodularity with increased internal lucency in this nodularity compared to 11/23/2022, and overall stable from 02/12/2024. Adjacent fiducial noted. Correlate with prior biopsy/workup. 1.4 by 0.8 cm lachrymiform subpleural nodule medially in the right middle lobe on image 114 series 3, stable from 11/23/2022. This is likely benign but may merit surveillance. 0.4 cm right lower lobe nodule on image 96 series 3, no change from 11/23/2022,  considered benign. Musculoskeletal: Degenerative glenohumeral arthropathy bilaterally.  Cervical spine hardware noted. CT ABDOMEN PELVIS FINDINGS Hepatobiliary: Cholecystectomy.  Otherwise unremarkable. Pancreas: Unremarkable Spleen: Unremarkable Adrenals/Urinary Tract: Urinary bladder wall thickening may be related to nondistention but correlate with urine analysis. Stomach/Bowel: Loops small bowel the pelvis demonstrate bowel wall thickening and surrounding inflammatory stranding along with a small amount of ascites. Loops proximal to these thick-walled bowel loops are mildly dilated up to about 3.3 cm. Do not see a definite lead point for obstruction, transition from the thick-walled small bowel to nondistended and non thickened distal loops is fairly gradual. Appearance favors enteritis potentially with some local ileus over obstruction although surveillance is likely indicated particularly given the degree of mesenteric edema the mild ascites. Mild sigmoid colon diverticulosis. Vascular/Lymphatic: Atherosclerosis is present, including aortoiliac atherosclerotic disease. Atheromatous plaque dorsally at the origins of the celiac trunk and SMA without critical stenosis or overt occlusion. Reproductive: Uterus absent. Other: Trace perisplenic ascites. Musculoskeletal: Right distal iliopsoas lipoma. Grade 1 anterolisthesis at L3-4 with left paracentral and foraminal disc protrusion causing moderate left foraminal stenosis. IMPRESSION: 1. Loops of small bowel in the pelvis demonstrate bowel wall thickening and surrounding inflammatory stranding along with a small amount of ascites. Loops proximal to these thick-walled bowel loops are mildly dilated up to about 3.3 cm. I do not see a definite lead point for obstruction. Appearance favors enteritis potentially with some local ileus over obstruction although surveillance is likely indicated particularly given the degree of mesenteric edema the mild ascites. 2. Urinary  bladder wall thickening may be related to nondistention but correlate with urine analysis. 3. Left apical pleuroparenchymal scarring and nodularity with increased internal lucency in this nodularity compared to 11/23/2022, and overall stable from 02/12/2024. Adjacent fiducial noted. Correlate with prior biopsy/workup. 4. 1.4 by 0.8 cm lachrymiform subpleural nodule medially in the right middle lobe, stable from 11/23/2022. This is likely benign but may merit surveillance. 5. Grade 1 anterolisthesis at L3-4 with left paracentral and foraminal disc protrusion causing moderate left foraminal stenosis. 6.  Aortic Atherosclerosis (ICD10-I70.0). Electronically Signed   By: Ryan Salvage M.D.   On: 03/23/2024 11:19     Procedures   Medications Ordered in the ED  iohexol  (OMNIPAQUE ) 350 MG/ML injection 100 mL (75 mLs Intravenous Contrast Given 03/23/24 1050)  ceFEPIme  (MAXIPIME ) 2 g in sodium chloride  0.9 % 100 mL IVPB (0 g Intravenous Stopped 03/23/24 1242)  diphenhydrAMINE  (BENADRYL ) injection 12.5 mg (12.5 mg Intravenous Given 03/23/24 1205)  ketorolac  (TORADOL ) 15 MG/ML injection 15 mg (15 mg Intravenous Given 03/23/24 1320)  ondansetron  (ZOFRAN ) injection 4 mg (4 mg Intravenous Given 03/23/24 1319)                                    Medical Decision Making Amount and/or Complexity of Data Reviewed Labs: ordered. Radiology: ordered.  Risk Prescription drug management.     Differential diagnosis includes but is not limited to Acute cholecystitis, cholelithiasis, cholangitis, choledocholithiasis, peptic ulcer, gastritis, gastroenteritis, appendicitis, IBS, IBD, DKA, nephrolithiasis, UTI, pyelonephritis, pancreatitis, diverticulitis, mesenteric ischemia, abdominal aortic aneurysm, small bowel obstruction, volvulus, sepsis   ED Course:  Upon initial evaluation, patient is well-appearing, no acute distress.  Stable vitals aside from her mildly elevated blood pressure 145/70.  She is afebrile,  not tachycardic, not tachypneic, no signs of sepsis currently.  Abdomen is diffusely tender with mild guarding in the lower abdomen.  Labs Ordered: I Ordered, and personally interpreted labs.  The pertinent results  include:   CBC with leukocytosis of 16.6 CMP with elevated glucose at 210.  Otherwise within normal limits with normal LFTs and creatinine. Lipase within normal limits Urinalysis with hazy appearance, positive for nitrates and leukocytes.  No squamous epithelial cells Troponin within normal limits  Imaging Studies ordered: I ordered imaging studies including CT chest abdomen and pelvis I independently visualized the imaging with scope of interpretation limited to determining acute life threatening conditions related to emergency care. Imaging showed  IMPRESSION: 1. Loops of small bowel in the pelvis demonstrate bowel wall thickening and surrounding inflammatory stranding along with a small amount of ascites. Loops proximal to these thick-walled bowel loops are mildly dilated up to about 3.3 cm. I do not see a definite lead point for obstruction. Appearance favors enteritis potentially with some local ileus over obstruction although surveillance is likely indicated particularly given the degree of mesenteric edema the mild ascites. 2. Urinary bladder wall thickening may be related to nondistention but correlate with urine analysis. 3. Left apical pleuroparenchymal scarring and nodularity with increased internal lucency in this nodularity compared to 11/23/2022, and overall stable from 02/12/2024. Adjacent fiducial noted. Correlate with prior biopsy/workup. 4. 1.4 by 0.8 cm lachrymiform subpleural nodule medially in the right middle lobe, stable from 11/23/2022. This is likely benign but may merit surveillance. 5. Grade 1 anterolisthesis at L3-4 with left paracentral and foraminal disc protrusion causing moderate left foraminal stenosis. 6.  Aortic Atherosclerosis  (ICD10-I70.0). I agree with the radiologist interpretation   Cardiac Monitoring: / EKG: The patient was maintained on a cardiac monitor.  I personally viewed and interpreted the cardiac monitored which showed an underlying rhythm of: Normal sinus rhythm   Consultations Obtained: I requested consultation with the close Dr. Roxane,  and discussed lab and imaging findings as well as pertinent plan - they recommend: Admission  Medications Given: Zofran , Toradol , cefepime , Benadryl   Upon re-evaluation, patient remains well-appearing with stable vitals.  Reports pain improved with the Toradol .  She is tolerating p.o. intake of ginger ale at bedside.  We discussed that her urine did show signs of a UTI, and we started her on cefepime  here for treatment.  CT scan does show possible enteritis with dilated loops of bowel, but no signs of obstruction or ileus.  I had a shared decision-making conversation with the patient, and given she is still having some ongoing abdominal pain, will plan on admission for observation overnight to ensure she is improving appropriately with antibiotics.     Impression: Acute cystitis Enteritis  Disposition:  Admission with hospitalist Dr. Roxane   Record Review: External records from outside source obtained and reviewed including  Urine culture from January 2024 which grew out E. coli and Aerococcus, susceptible to ampicillin, ampicillin sulbactam, cefazolin , cefepime , ceftriaxone , gentamicin, imipenem, nitrofurantoin , pip-tazo Discharge note from 02/12/2024, appears patient was discharged on cefadroxil  for her UTI     This chart was dictated using voice recognition software, Dragon. Despite the best efforts of this provider to proofread and correct errors, errors may still occur which can change documentation meaning.       Final diagnoses:  Acute cystitis with hematuria  Enteritis    ED Discharge Orders     None          Veta Palma,  PA-C 03/23/24 1429    Neysa Caron PARAS, DO 03/23/24 1527

## 2024-03-23 NOTE — Plan of Care (Signed)
   Problem: Education: Goal: Knowledge of General Education information will improve Description Including pain rating scale, medication(s)/side effects and non-pharmacologic comfort measures Outcome: Progressing   Problem: Health Behavior/Discharge Planning: Goal: Ability to manage health-related needs will improve Outcome: Progressing

## 2024-03-24 DIAGNOSIS — Z66 Do not resuscitate: Secondary | ICD-10-CM | POA: Diagnosis present

## 2024-03-24 DIAGNOSIS — A09 Infectious gastroenteritis and colitis, unspecified: Secondary | ICD-10-CM | POA: Diagnosis present

## 2024-03-24 DIAGNOSIS — K529 Noninfective gastroenteritis and colitis, unspecified: Secondary | ICD-10-CM | POA: Diagnosis present

## 2024-03-24 DIAGNOSIS — B965 Pseudomonas (aeruginosa) (mallei) (pseudomallei) as the cause of diseases classified elsewhere: Secondary | ICD-10-CM | POA: Diagnosis present

## 2024-03-24 DIAGNOSIS — Z955 Presence of coronary angioplasty implant and graft: Secondary | ICD-10-CM | POA: Diagnosis not present

## 2024-03-24 DIAGNOSIS — F419 Anxiety disorder, unspecified: Secondary | ICD-10-CM | POA: Diagnosis present

## 2024-03-24 DIAGNOSIS — E1140 Type 2 diabetes mellitus with diabetic neuropathy, unspecified: Secondary | ICD-10-CM | POA: Diagnosis present

## 2024-03-24 DIAGNOSIS — R188 Other ascites: Secondary | ICD-10-CM | POA: Diagnosis present

## 2024-03-24 DIAGNOSIS — I480 Paroxysmal atrial fibrillation: Secondary | ICD-10-CM | POA: Diagnosis present

## 2024-03-24 DIAGNOSIS — Z96652 Presence of left artificial knee joint: Secondary | ICD-10-CM | POA: Diagnosis present

## 2024-03-24 DIAGNOSIS — I2510 Atherosclerotic heart disease of native coronary artery without angina pectoris: Secondary | ICD-10-CM | POA: Diagnosis present

## 2024-03-24 DIAGNOSIS — G894 Chronic pain syndrome: Secondary | ICD-10-CM | POA: Diagnosis present

## 2024-03-24 DIAGNOSIS — E1122 Type 2 diabetes mellitus with diabetic chronic kidney disease: Secondary | ICD-10-CM | POA: Diagnosis present

## 2024-03-24 DIAGNOSIS — E1165 Type 2 diabetes mellitus with hyperglycemia: Secondary | ICD-10-CM | POA: Diagnosis present

## 2024-03-24 DIAGNOSIS — I130 Hypertensive heart and chronic kidney disease with heart failure and stage 1 through stage 4 chronic kidney disease, or unspecified chronic kidney disease: Secondary | ICD-10-CM | POA: Diagnosis present

## 2024-03-24 DIAGNOSIS — E782 Mixed hyperlipidemia: Secondary | ICD-10-CM | POA: Diagnosis present

## 2024-03-24 DIAGNOSIS — Z881 Allergy status to other antibiotic agents status: Secondary | ICD-10-CM | POA: Diagnosis not present

## 2024-03-24 DIAGNOSIS — N3001 Acute cystitis with hematuria: Secondary | ICD-10-CM | POA: Diagnosis present

## 2024-03-24 DIAGNOSIS — Z79899 Other long term (current) drug therapy: Secondary | ICD-10-CM | POA: Diagnosis not present

## 2024-03-24 DIAGNOSIS — N1832 Chronic kidney disease, stage 3b: Secondary | ICD-10-CM | POA: Diagnosis present

## 2024-03-24 DIAGNOSIS — Z7902 Long term (current) use of antithrombotics/antiplatelets: Secondary | ICD-10-CM | POA: Diagnosis not present

## 2024-03-24 DIAGNOSIS — K219 Gastro-esophageal reflux disease without esophagitis: Secondary | ICD-10-CM | POA: Diagnosis present

## 2024-03-24 DIAGNOSIS — Z7901 Long term (current) use of anticoagulants: Secondary | ICD-10-CM | POA: Diagnosis not present

## 2024-03-24 DIAGNOSIS — I5032 Chronic diastolic (congestive) heart failure: Secondary | ICD-10-CM | POA: Diagnosis present

## 2024-03-24 DIAGNOSIS — F32A Depression, unspecified: Secondary | ICD-10-CM | POA: Diagnosis present

## 2024-03-24 LAB — BASIC METABOLIC PANEL WITH GFR
Anion gap: 11 (ref 5–15)
BUN: 26 mg/dL — ABNORMAL HIGH (ref 8–23)
CO2: 26 mmol/L (ref 22–32)
Calcium: 8.5 mg/dL — ABNORMAL LOW (ref 8.9–10.3)
Chloride: 98 mmol/L (ref 98–111)
Creatinine, Ser: 0.94 mg/dL (ref 0.44–1.00)
GFR, Estimated: 60 mL/min — ABNORMAL LOW (ref >60)
Glucose, Bld: 201 mg/dL — ABNORMAL HIGH (ref 70–99)
Potassium: 4.4 mmol/L (ref 3.5–5.1)
Sodium: 135 mmol/L (ref 135–145)

## 2024-03-24 LAB — GLUCOSE, CAPILLARY
Glucose-Capillary: 198 mg/dL — ABNORMAL HIGH (ref 70–99)
Glucose-Capillary: 252 mg/dL — ABNORMAL HIGH (ref 70–99)
Glucose-Capillary: 167 mg/dL — ABNORMAL HIGH (ref 70–99)

## 2024-03-24 LAB — CBC
HCT: 46.5 % — ABNORMAL HIGH (ref 36.0–46.0)
Hemoglobin: 14.2 g/dL (ref 12.0–15.0)
MCH: 29.9 pg (ref 26.0–34.0)
MCHC: 30.5 g/dL (ref 30.0–36.0)
MCV: 97.9 fL (ref 80.0–100.0)
Platelets: 383 K/uL (ref 150–400)
RBC: 4.75 MIL/uL (ref 3.87–5.11)
RDW: 13.9 % (ref 11.5–15.5)
WBC: 12.8 K/uL — ABNORMAL HIGH (ref 4.0–10.5)
nRBC: 0 % (ref 0.0–0.2)

## 2024-03-24 MED ORDER — SODIUM CHLORIDE 0.9 % IV SOLN
1.0000 g | Freq: Two times a day (BID) | INTRAVENOUS | Status: DC
  Administered 2024-03-24 – 2024-03-26 (×4): 1 g via INTRAVENOUS
  Filled 2024-03-24 (×4): qty 10

## 2024-03-24 MED ORDER — LACTATED RINGERS IV SOLN: INTRAVENOUS | Status: AC

## 2024-03-24 MED ORDER — PROCHLORPERAZINE EDISYLATE 10 MG/2ML IJ SOLN
5.0000 mg | Freq: Once | INTRAMUSCULAR | Status: AC | PRN
  Administered 2024-03-24: 5 mg via INTRAVENOUS

## 2024-03-24 NOTE — Progress Notes (Signed)
 PROGRESS NOTE    [name] [name]  FMW:[id number] DOB: 1941-03-11 DOA: 03/23/2024 PCP: Arloa Jarvis, NP   Brief Narrative: [name] [name] is a 83 y.o. female with a history of CAD, bacterial endocarditis, hypertension, hyperlipidemia, diabetes mellitus type 2, paroxysmal atrial fibrillation.  Patient presented secondary to abdominal pain and dysuria and found to have evidence of small bowel enteritis in addition to evidence of UTI.  Unclear etiology for enteritis.  Patient start empirically on ceftriaxone  for coverage of UTI with cultures obtained.  Urine culture significant for Pseudomonas aeruginosa and P transition to cefepime .  Gastroenterology was consulted for assistance in management/diagnosis of small bowel enteritis..   Assessment and Plan:  Small bowel enteritis Noted on CT imaging. Associated mesenteric edema and ascites. Unclear etiology. No associated diarrhea or notable postprandial worsening of pain. Associated leukocytosis with WBC of 16,600, improved to 12,800 today.  -GI consultation -Continue analgesics as needed  UTI Urine culture significant for pseudomonas aeruginosa. -Discontinue Ceftriaxone  and start Cefepime  -Follow-up urine culture sensitivities  Diabetes mellitus type 2 Well controlled for age based on hemoglobin A1C. Patient is on Lantus  and Novolog  as an outpatient. Patient started on SSI during admission. -Continue SSI  Hyperlipidemia -Continue Lipitor   CAD -Continue Plavix   Paroxysmal atrial fibrillation -Continue Eliquis   Chronic HFpEF -Continue metoprolol   GERD -Continue Protonix   Neuropathy -Continue gabapentin   Nocturnal apnea Noted. Patient recommended for outpatient sleep study. CPAP ordered for nighttime this admission. -Continue CPAP   DVT prophylaxis: Eliquis  Code Status:   Code Status: Limited: Do not attempt resuscitation (DNR) -DNR-LIMITED -Do Not Intubate/DNI  Family Communication: Daughter-in-law at  bedside Disposition Plan: Discharge pending ability to transition to outpatient antibiotics, GI recommendations, improved abdominal pain   Consultants:  [facility]  Procedures:  None  Antimicrobials: Ceftriaxone  IV Cefepime     Subjective: Patient reports pain has remain unchanged since admission after pain medication effect has worn off. No nausea, vomiting or diarrhea. No hematochezia.  Objective: BP 130/73 (BP Location: Left Arm)   Pulse (!) 110   Temp 98.2 F (36.8 C) (Oral)   Resp 15   Ht 5' (1.524 m)   Wt 53.7 kg   SpO2 95%   BMI 23.12 kg/m   Examination:  General exam: Appears calm and comfortable Respiratory system: Clear to auscultation. Respiratory effort normal. Cardiovascular system: S1 & S2 heard, RRR. Gastrointestinal system: Abdomen is nondistended, soft and nontender. Decreased bowel sounds heard. Central nervous system: Alert and oriented. No focal neurological deficits. Psychiatry: Judgement and insight appear normal. Mood & affect appropriate.    Data Reviewed: I have personally reviewed following labs and imaging studies  CBC Lab Results  Component Value Date   WBC 12.8 (H) 03/24/2024   RBC 4.75 03/24/2024   HGB 14.2 03/24/2024   HCT 46.5 (H) 03/24/2024   MCV 97.9 03/24/2024   MCH 29.9 03/24/2024   PLT 383 03/24/2024   MCHC 30.5 03/24/2024   RDW 13.9 03/24/2024   LYMPHSABS 2.2 03/23/2024   MONOABS 1.3 (H) 03/23/2024   EOSABS 0.1 03/23/2024   BASOSABS 0.1 03/23/2024     Last metabolic panel Lab Results  Component Value Date   NA 135 03/24/2024   K 4.4 03/24/2024   CL 98 03/24/2024   CO2 26 03/24/2024   BUN 26 (H) 03/24/2024   CREATININE 0.94 03/24/2024   GLUCOSE 201 (H) 03/24/2024   GFRNONAA 60 (L) 03/24/2024   GFRAA 71 05/17/2020   CALCIUM  8.5 (L) 03/24/2024   PHOS 3.5 02/18/2024  PROT 7.4 03/23/2024   ALBUMIN 4.2 03/23/2024   BILITOT 0.7 03/23/2024   ALKPHOS 91 03/23/2024   AST 19 03/23/2024   ALT 26  03/23/2024   ANIONGAP 11 03/24/2024    GFR: Estimated Creatinine Clearance: 32 mL/min (by C-G formula based on SCr of 0.94 mg/dL).  Recent Results (from the past 240 hours)  Culture, blood (Routine X 2) w Reflex to ID Panel     Status: None (Preliminary result)   Collection Time: 03/23/24  5:55 PM   Specimen: BLOOD LEFT HAND  Result Value Ref Range Status   Specimen Description   Final    BLOOD LEFT HAND Performed at [facility], [address], [place], [place] [address]    Special Requests   Final    BOTTLES DRAWN AEROBIC ONLY Blood Culture adequate volume Performed at [facility], [address], [place], [place] [address]    Culture   Final    NO GROWTH < 12 HOURS Performed at [facility], [address], [place], [place] [address]    Report Status PENDING  Incomplete  Culture, blood (Routine X 2) w Reflex to ID Panel     Status: None (Preliminary result)   Collection Time: 03/23/24  7:11 PM   Specimen: BLOOD  Result Value Ref Range Status   Specimen Description   Final    BLOOD BLOOD RIGHT HAND Performed at [facility], [address] [place], [place] [address]    Special Requests   Final    BOTTLES DRAWN AEROBIC ONLY Blood Culture adequate volume   Culture   Final    NO GROWTH < 12 HOURS Performed at [facility], [address] [place], [place] [address]    Report Status PENDING  Incomplete      Radiology Studies: CT CHEST ABDOMEN PELVIS W CONTRAST Result Date: 03/23/2024 CLINICAL DATA:  Mid abdominal pain for the last 2-3 days. Reduced urinary output. Recent hospitalization for sepsis related to urinary tract infection. EXAM: CT CHEST, ABDOMEN, AND PELVIS WITH CONTRAST TECHNIQUE: Multidetector CT imaging of the chest, abdomen and pelvis was performed following the standard protocol during bolus administration of intravenous contrast. RADIATION DOSE REDUCTION: This exam was performed according to the  departmental dose-optimization program which includes automated exposure control, adjustment of the mA and/or kV according to patient size and/or use of iterative reconstruction technique. CONTRAST:  75mL OMNIPAQUE  IOHEXOL  350 MG/ML SOLN COMPARISON:  02/12/2024 FINDINGS: CT CHEST FINDINGS Cardiovascular: Coronary, aortic arch, and branch vessel atherosclerotic vascular disease. Aortic valve calcification. Mediastinum/Nodes: Unremarkable Lungs/Pleura: Left apical pleuroparenchymal scarring and nodularity with increased internal lucency in this nodularity compared to 11/23/2022, and overall stable from 02/12/2024. Adjacent fiducial noted. Correlate with prior biopsy/workup. 1.4 by 0.8 cm lachrymiform subpleural nodule medially in the right middle lobe on image 114 series 3, stable from 11/23/2022. This is likely benign but may merit surveillance. 0.4 cm right lower lobe nodule on image 96 series 3, no change from 11/23/2022, considered benign. Musculoskeletal: Degenerative glenohumeral arthropathy bilaterally. Cervical spine hardware noted. CT ABDOMEN PELVIS FINDINGS Hepatobiliary: Cholecystectomy.  Otherwise unremarkable. Pancreas: Unremarkable Spleen: Unremarkable Adrenals/Urinary Tract: Urinary bladder wall thickening may be related to nondistention but correlate with urine analysis. Stomach/Bowel: Loops small bowel the pelvis demonstrate bowel wall thickening and surrounding inflammatory stranding along with a small amount of ascites. Loops proximal to these thick-walled bowel loops are mildly dilated up to about 3.3 cm. Do not see a definite lead  point for obstruction, transition from the thick-walled small bowel to nondistended and non thickened distal loops is fairly gradual. Appearance favors enteritis potentially with some local ileus over obstruction although surveillance is likely indicated particularly given the degree of mesenteric edema the mild ascites. Mild sigmoid colon diverticulosis. Vascular/Lymphatic:  Atherosclerosis is present, including aortoiliac atherosclerotic disease. Atheromatous plaque dorsally at the origins of the celiac trunk and SMA without critical stenosis or overt occlusion. Reproductive: Uterus absent. Other: Trace perisplenic ascites. Musculoskeletal: Right distal iliopsoas lipoma. Grade 1 anterolisthesis at L3-4 with left paracentral and foraminal disc protrusion causing moderate left foraminal stenosis. IMPRESSION: 1. Loops of small bowel in the pelvis demonstrate bowel wall thickening and surrounding inflammatory stranding along with a small amount of ascites. Loops proximal to these thick-walled bowel loops are mildly dilated up to about 3.3 cm. I do not see a definite lead point for obstruction. Appearance favors enteritis potentially with some local ileus over obstruction although surveillance is likely indicated particularly given the degree of mesenteric edema the mild ascites. 2. Urinary bladder wall thickening may be related to nondistention but correlate with urine analysis. 3. Left apical pleuroparenchymal scarring and nodularity with increased internal lucency in this nodularity compared to 11/23/2022, and overall stable from 02/12/2024. Adjacent fiducial noted. Correlate with prior biopsy/workup. 4. 1.4 by 0.8 cm lachrymiform subpleural nodule medially in the right middle lobe, stable from 11/23/2022. This is likely benign but may merit surveillance. 5. Grade 1 anterolisthesis at L3-4 with left paracentral and foraminal disc protrusion causing moderate left foraminal stenosis. 6.  Aortic Atherosclerosis (ICD10-I70.0). Electronically Signed   By: Ryan Salvage M.D.   On: 03/23/2024 11:19      LOS: 0 days    Elgin Lam, MD [facility] 03/24/2024, 7:54 AM   If 7PM-7AM, please contact night-coverage [email or web address]

## 2024-03-24 NOTE — Consult Note (Addendum)
 Referring Provider: Dr. Elgin Lam Primary Care Physician:  Arloa Jarvis, NP Primary Gastroenterologist:  Dr. Albertus   Reason for Consultation: Abdominal pain, enteritis with mesenteric edema  HPI: [name] [name] is a 83 y.o. female with a past medical history of anxiety, CAD s/p MI 2007, s/p DES 2007, 2018, 2021 and 2022 on Plavix , HFpEF, bacterial endocarditis, atrial fibrillation on Eliquis , mini CVA, diabetes mellitus type 2, CKD stage IIIb, left lung nodule, GERD, ischemic colitis per colonoscopy 12/2012, colon polyps and internal hemorrhoids with bleeding and prolapse s/p hemorrhoidal banding x 3 in 2022. Past appendectomy and cholecystectomy.  Recently admitted to the hospital 6/11 - 02/19/2024 with UTI and sepsis. She presented to the ED 03/23/2024 with lower abdominal pain x 3 days.  Labs in the ED showed a WBC count of 16.6.  Hemoglobin 14.7.  Platelets 469. Potassium 4.5.  BUN 21.  Creatinine 0.92.  Normal LFTs.  Lipase 23.  Urine leukocytes and nitrites positive.  Urine culture pending. CTAP showed loops of small bowel in the pelvis with bowel wall thickening and surrounding laboratory stranding along with a small amount of ascites consistent with enteritis with mesenteric edema/ascites. On Rocephin  IV for UTI and enteritis. A GI consult was requested for further evaluation regarding abdominal pain and enteritis.  She developed lower abdominal pain early Saturday morning 03/21/2024 which worsened overnight. On Sunday 7/20, she vomited nonbloody bilious emesis x 1 episode and passed a normal formed brown stool. No diarrhea. Her lower abdominal pain worsened therefore she presented to the ED 03/2020 for further evaluation. Her lower abdominal pain has significantly improved but has not abated. Last dose of Dilaudid  and Oxycodone  were yesterday. No BM since admission but she stated she is passing gas per the rectum. She is tolerating a soft diet. No GERD symptoms on Pantoprazole  40 mg twice  daily. Her most recent colonoscopy was 06/28/2016 which identified one tubular adenomatous polyp removed from the colo and moderate diverticulosis to the sigmoid and descending colon.  No known family history of IBD. She remains on Eliquis  for new onset A-fib which occurred during her recent hospitalization 02/2024 with UTI and sepsis. She remains on Plavix  secondary to CAD with multiple stents. Currently has a Zio patch heart monitor in process.  She denies having any chest pain, palpitations or shortness of breath at this time.  No family at the bedside.  ECHO 02/13/2024: Left ventricular ejection fraction, by estimation, is 65 to 70%. The left ventricle has normal function. The left ventricle has no regional wall motion abnormalities. There is mild left ventricular hypertrophy. Left ventricular diastolic parameters were normal. 1. Right ventricular systolic function is normal. The right ventricular size is not well visualized. Tricuspid regurgitation signal is inadequate for assessing PA pressure. 2. 3. Left atrial size was mildly dilated. 4. A small pericardial effusion is present. There is no evidence of cardiac tamponade. The mitral valve is degenerative. Mild mitral valve regurgitation. No evidence of mitral stenosis. 5. The aortic valve was not well visualized. There is mild calcification of the aortic valve. Aortic valve regurgitation is mild. Aortic valve sclerosis/calcification is present, without any evidence of aortic stenosis. 6. The inferior vena cava is normal in size with greater than 50% respiratory variability, suggesting right atrial pressure of 3 mmHg.  GI PROCEDURES:  Colonoscopy 06/28/2016: - The examined portion of the ileum was normal.  - One 3 mm tubular adenomatous polyp in the descending colon, removed with a cold biopsy forceps. Resected and retrieved.  -  Moderate diverticulosis in the sigmoid colon and in the descending colon.  - Normal mucosa in the entire examined colon.  Biopsied.  - Internal hemorrhoids.  Past Medical History:  Diagnosis Date   Allergy    Anal fissure    Anxiety    CAD (coronary artery disease)    s/p inf MI 2007 - tx w/ DES to RCA // s/p POBA to [facility] and DES to dRCA in 11/18 ([facility] in [place], [place]) // Myoview  3/21: low risk // s/p DES to pRCA   Cataract    removed both eyes   Cerebrovascular disease    Carotid US  09/2017 [facility]): mild plaque (<50%) in both carotid arteries   Chronic neck pain    Chronic pain syndrome    Diabetes Mellitus, Type 2    Diverticular disease    DJD (degenerative joint disease)    Echocardiogram 10/2018    Echo 10/2018: EF 55-60, normal RVSF, mod MAC, mod TR, severe AoV calcification and sclerosis with nodular calcium /mobile area of calcium  in the LVOT (small veg vs Lambl's excrescence  - consider TEE), mild AI, mild AS (mean 11).    Echocardiogram 04/2019    Echocardiogram 04/2019: EF 60-65, basal septal hypertrophy, grade 2 diastolic dysfunction, normal wall motion, normal RV SF, mild LAE, mod MAC, trivial MR, mod sclerosis of the aortic valve with mod aortic annular calcification, thin mobile filamentous structure on ventricular side of AV likely representing Lambl's excrescence, mild AI, mild TR   Frequent UTI's    Gastroesophageal reflux disease    H/O bacterial endocarditis    Rx w IV Abxs in 2020 ([facility] in [place], [place]) // F/u echo with AoV Lambl's excrescence   H/O hiatal hernia    HTN (hypertension)    Hx of fall 10/2020   Hx of MI 2007   Hx of Stroke    IBS (irritable bowel syndrome)    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 (Cervical Fusion)   Premature ventricular contractions    Tubular  adenoma of colon    Ulcer    [facility]   Urinary incontinence     Past Surgical History:  Procedure Laterality Date   APPENDECTOMY  1955   BLADDER REPAIR  2007   BRONCHIAL BIOPSY  05/17/2022   Procedure: BRONCHIAL BIOPSIES;  Surgeon: Gladis Leonor HERO, MD;  Location: [facility] ENDOSCOPY;  Service: Pulmonary;;   BRONCHIAL BRUSHINGS  05/17/2022   Procedure: BRONCHIAL BRUSHINGS;  Surgeon: Gladis Leonor HERO, MD;  Location: [facility] ENDOSCOPY;  Service: Pulmonary;;   BRONCHIAL NEEDLE ASPIRATION BIOPSY  05/17/2022   Procedure: BRONCHIAL NEEDLE ASPIRATION BIOPSIES;  Surgeon: Gladis Leonor HERO, MD;  Location: [facility] ENDOSCOPY;  Service: Pulmonary;;   CARDIAC CATHETERIZATION  2007   Stents   CERVICAL SPINE SURGERY  07/2005   Dr Armen   CHOLECYSTECTOMY  2007   COLONOSCOPY     CORONARY PRESSURE/FFR WITH 3D MAPPING N/A 06/28/2021   Procedure: Coronary Pressure Wire/FFR w/3D Mapping;  Surgeon: Verlin Lonni BIRCH, MD;  Location: [facility] INVASIVE CV LAB;  Service: Cardiovascular;  Laterality: N/A;   CORONARY STENT INTERVENTION N/A 03/09/2020   Procedure: CORONARY STENT  INTERVENTION;  Surgeon: Verlin Lonni BIRCH, MD;  Location: [facility] INVASIVE CV LAB;  Service: Cardiovascular;  Laterality: N/A;   CORONARY STENT INTERVENTION N/A 06/28/2021   Procedure: CORONARY STENT INTERVENTION;  Surgeon: Verlin Lonni BIRCH, MD;  Location: [facility] INVASIVE CV LAB;  Service: Cardiovascular;  Laterality: N/A;   DENTAL SURGERY  05/2013   replaced an inplant   EYE SURGERY  2011   Bilteral   FIDUCIAL MARKER PLACEMENT  05/17/2022   Procedure: FIDUCIAL MARKER PLACEMENT;  Surgeon: Gladis Leonor HERO, MD;  Location: [facility] ENDOSCOPY;  Service: Pulmonary;;  LUL   I & D KNEE WITH POLY EXCHANGE  09/25/2011   Procedure: IRRIGATION AND DEBRIDEMENT KNEE WITH POLY EXCHANGE;  Surgeon: Fonda SHAUNNA Olmsted, MD;  Location: [facility] OR;  Service: Orthopedics;  Laterality: Left;   INCISION AND DRAINAGE ABSCESS / HEMATOMA OF BURSA / KNEE / THIGH  09/2011   JOINT  REPLACEMENT  2012   left   LEFT HEART CATH AND CORONARY ANGIOGRAPHY N/A 03/09/2020   Procedure: LEFT HEART CATH AND CORONARY ANGIOGRAPHY;  Surgeon: Verlin Lonni BIRCH, MD;  Location: [facility] INVASIVE CV LAB;  Service: Cardiovascular;  Laterality: N/A;   LEFT HEART CATH AND CORONARY ANGIOGRAPHY N/A 06/28/2021   Procedure: LEFT HEART CATH AND CORONARY ANGIOGRAPHY;  Surgeon: Verlin Lonni BIRCH, MD;  Location: [facility] INVASIVE CV LAB;  Service: Cardiovascular;  Laterality: N/A;   left Total Knee Replacement  11/2010   Dr Beverley   NECK SURGERY     has had 3 surgeries   POLYPECTOMY     POSTERIOR CERVICAL FUSION/FORAMINOTOMY  04/08/2012   Procedure: POSTERIOR CERVICAL FUSION/FORAMINOTOMY LEVEL 2;  Surgeon: Lynwood JONELLE Mill, MD;  Location: [facility] NEURO ORS;  Service: Neurosurgery;  Laterality: Left;  Left Cervical six-seven Foraminotomy, bilateral cervical seven-thoracic one foraminotomy, cervical six-seven, cervical seven-thoracic one fusion with posterior instrumentation   TEMPOROMANDIBULAR JOINT SURGERY     thumb surgery     TONSILLECTOMY     TONSILLECTOMY  1950   TOTAL ABDOMINAL HYSTERECTOMY     VIDEO BRONCHOSCOPY WITH RADIAL ENDOBRONCHIAL ULTRASOUND  05/17/2022   Procedure: VIDEO BRONCHOSCOPY WITH RADIAL ENDOBRONCHIAL ULTRASOUND;  Surgeon: Gladis Leonor HERO, MD;  Location: [facility] ENDOSCOPY;  Service: Pulmonary;;    Prior to Admission medications   Medication Sig Start Date End Date Taking? Authorizing Provider  acetaminophen  (TYLENOL ) 325 MG tablet Take 650 mg by mouth every 6 (six) hours as needed for moderate pain.   Yes [provider]  amLODipine  (NORVASC ) 10 MG tablet Take 1 tablet (10 mg total) by mouth in the morning for high blood pressure. 03/16/24  Yes   apixaban  (ELIQUIS ) 2.5 MG TABS tablet Take 1 tablet (2.5 mg total) by mouth 2 (two) times daily. 03/02/24  Yes Weaver, Scott T, PA-C  Ascorbic Acid  (VITAMIN C ) 500 MG CAPS Take 500 mg by mouth in the morning and at bedtime.   Yes [provider]  atorvastatin  (LIPITOR ) 80 MG tablet Take 1 tablet (80 mg total) by mouth at bedtime. 03/02/24  Yes Weaver, Scott T, PA-C  Cinnamon 500 MG capsule Take 2 capsules twice a day by oral route as directed.   Yes [provider]  clopidogrel  (PLAVIX ) 75 MG tablet Take 1 tablet (75 mg total) by mouth in the morning to prevent blood clots. 03/16/24  Yes   dicyclomine  (BENTYL ) 10 MG capsule Take 1 capsule (10 mg) by mouth every 8 hours (up to 3 times per day) for abdominal pain/spasms as needed. 03/09/24  Yes  ezetimibe  (ZETIA ) 10 MG tablet Take 1 tablet (10 mg total) by mouth daily for cholesterol. 03/16/24  Yes   famotidine  (PEPCID ) 20 MG tablet Take 1 tablet (20 mg total) by mouth daily as needed for severe indigestion and heartburn 03/09/24  Yes   gabapentin  (NEURONTIN ) 300 MG capsule Take 1 capsule by oral route every 8 hours (up to 3 times daily) for nerve/chronic pain. 03/09/24  Yes   insulin  aspart (NOVOLOG ) 100 UNIT/ML FlexPen Before each meal 3 times a day, 140-199 - 2 units, 200-250 - 4 units, 251-299 - 6 units,  300-349 - 8 units,  350 or above 10 units. 02/19/24  Yes Singh, Prashant K, MD  insulin  glargine (LANTUS  SOLOSTAR) 100 UNIT/ML Solostar Pen Inject 10 Units into the skin daily for diabetes. 03/16/24  Yes   metoprolol  tartrate (LOPRESSOR ) 100 MG tablet Take 1 tablet (100 mg total) by mouth 2 (two) times daily. 03/02/24  Yes Weaver, Scott T, PA-C  montelukast  (SINGULAIR ) 10 MG tablet Take 1 tablet (10 mg total) by mouth at bedtime for allergies. 03/09/24  Yes   nitrofurantoin  (MACRODANTIN ) 50 MG capsule Take 1 capsule (50 mg total) by mouth daily for UTI prevention. 03/16/24  Yes   nitroGLYCERIN  (NITROSTAT ) 0.4 MG SL tablet Dissolve 1 tablet under your tongue at the first sign of heart attack/chest pain; no more than 3 tablets within a 15 minute period. 03/16/24  Yes   Omega-3 Fatty Acids (FISH OIL) 1000 MG CAPS Take 1,000 mg by mouth in the morning and at bedtime.   Yes [provider]  ondansetron  (ZOFRAN -ODT) 4 MG disintegrating tablet Take 4 mg by mouth every 8 (eight) hours as needed for nausea or vomiting. 02/19/24  Yes [provider]  pantoprazole  (PROTONIX ) 40 MG tablet Take 1 tablet (40 mg total) by mouth 2 (two) times daily for indigestion and heart burn. 03/09/24  Yes   tiZANidine  (ZANAFLEX ) 4 MG tablet Take 1 tablet by mouth twice daily as needed for chronic shoulder/neck pain. 03/16/24  Yes   venlafaxine  XR (EFFEXOR -XR) 37.5 MG 24 hr capsule Take 1 capsule (37.5 mg total) by mouth daily for anxiety and depression. 03/09/24  Yes   Accu-Chek Softclix Lancets lancets Use to monitor Blood Sugars daily. 03/18/24     amLODipine  (NORVASC ) 5 MG tablet Take 1 tablet (5 mg total) by mouth daily. Patient not taking: Reported on 03/23/2024 03/02/24 06/07/24  Lelon Hamilton T, PA-C  Blood Glucose Monitoring Suppl (ACCU-CHEK GUIDE) w/Device KIT Use to monitor Blood Sugars daily. 03/18/24     cefadroxil  (DURICEF) 500 MG capsule Take 1 capsule (500 mg total) by mouth daily for 7 days for UTI Patient not taking: Reported on 03/23/2024 03/09/24     glucose blood (ACCU-CHEK GUIDE TEST) test strip Use to monitor Blood Sugars daily. 03/18/24     glucose blood (ASSURE PRISM  MULTI TEST) test strip Use strips as directed to check blood sugar twice daily 03/17/24     Insulin  Pen Needle (PEN NEEDLES) 32G X 4 MM MISC Use as directed to administer insulin  daily. 03/16/24     Accu-Chek Softclix Lancets lancets Use to check blood sugar 2 (two) times daily. 03/17/24       Current Facility-Administered Medications  Medication Dose Route Frequency Provider Last Rate Last Admin   acetaminophen  (TYLENOL ) tablet 650 mg  650 mg Oral Q6H PRN Zella, Mir M, MD       Or   acetaminophen  (TYLENOL ) suppository 650 mg  650 mg Rectal Q6H  PRN Zella, Mir M, MD       albuterol  (PROVENTIL ) (2.5 MG/3ML) 0.083% nebulizer solution 2.5 mg  2.5 mg Nebulization Q2H PRN Zella, Mir M, MD        apixaban  (ELIQUIS ) tablet 2.5 mg  2.5 mg Oral BID Zella, Mir M, MD   2.5 mg at 03/24/24 9056   atorvastatin  (LIPITOR ) tablet 80 mg  80 mg Oral Daily Zella, Mir M, MD   80 mg at 03/24/24 9056   cefTRIAXone  (ROCEPHIN ) 1 g in sodium chloride  0.9 % 100 mL IVPB  1 g Intravenous Q24H Zella, Mir M, MD 200 mL/hr at 03/23/24 1850 1 g at 03/23/24 1850   clopidogrel  (PLAVIX ) tablet 75 mg  75 mg Oral q morning Zella, Mir M, MD   75 mg at 03/24/24 9056   gabapentin  (NEURONTIN ) capsule 300 mg  300 mg Oral TID Zella Katha HERO, MD   300 mg at 03/24/24 9056   HYDROmorphone  (DILAUDID ) injection 0.5-1 mg  0.5-1 mg Intravenous Q2H PRN Zella Katha HERO, MD   1 mg at 03/23/24 1853   insulin  aspart (novoLOG ) injection 0-15 Units  0-15 Units Subcutaneous TID WC Zella, Mir M, MD       insulin  aspart (novoLOG ) injection 0-5 Units  0-5 Units Subcutaneous QHS Zella, Mir M, MD       lactated ringers  infusion   Intravenous Continuous Zella, Mir M, MD 100 mL/hr at 03/23/24 1630 New Bag at 03/23/24 1630   ondansetron  (ZOFRAN ) tablet 4 mg  4 mg Oral Q6H PRN Zella Katha HERO, MD       Or   ondansetron  (ZOFRAN ) injection 4 mg  4 mg Intravenous Q6H PRN Zella, Mir M, MD   4 mg at 03/24/24 0100   oxyCODONE  (Oxy IR/ROXICODONE ) immediate release tablet 5 mg  5 mg Oral Q4H PRN Zella Katha HERO, MD   5 mg at 03/23/24 2058   venlafaxine  XR (EFFEXOR -XR) 24 hr capsule 37.5 mg  37.5 mg Oral Daily Zella, Mir M, MD   37.5 mg at 03/24/24 9056    Allergies as of 03/23/2024 - Review Complete 03/23/2024  Allergen Reaction Noted   Sulfa  antibiotics Itching 05/11/2022   Niacin  and related Other (See Comments) 03/25/2012   Codeine Nausea Only    Macrobid  [nitrofurantoin ] Nausea Only 12/12/2018   Prednisone Itching and Rash    Rocephin  [ceftriaxone ] Itching 09/30/2017   Sulfonamide derivatives Itching    Tetracycline Itching and Rash     Family History  Problem Relation Age of Onset   Heart  disease Mother    Pancreatic cancer Mother    Colon polyps Sister    Pancreatitis Sister    Lung cancer Brother    Cancer - Other Brother        vocal cord cancer   Coronary artery disease Other        sibling   Coronary artery disease Other        sibling   Colon cancer Neg Hx    Esophageal cancer Neg Hx    Rectal cancer Neg Hx    Stomach cancer Neg Hx     Social History   Socioeconomic History   Marital status: Widowed    Spouse name: dolly Kissel   Number of children: 6   Years of education: Not on file   Highest education level: Not on file  Occupational History    Employer: RETIRED  Tobacco Use   Smoking status: Never    Passive exposure: Never  Smokeless tobacco: Never  Vaping Use   Vaping status: Never Used  Substance and Sexual Activity   Alcohol  use: No   Drug use: No   Sexual activity: Yes    Partners: Male  Other Topics Concern   Not on file  Social History Narrative   Pt never knew her father. Daily caffeine use.   Social Drivers of Corporate investment banker Strain: Low Risk  (11/03/2021)   Overall Financial Resource Strain (CARDIA)    Difficulty of Paying Living Expenses: Not hard at all  Food Insecurity: No Food Insecurity (03/23/2024)   Hunger Vital Sign    Worried About Running Out of Food in the Last Year: Never true    Ran Out of Food in the Last Year: Never true  Transportation Needs: No Transportation Needs (03/23/2024)   PRAPARE - Administrator, Civil Service (Medical): No    Lack of Transportation (Non-Medical): No  Physical Activity: Sufficiently Active (11/03/2021)   Exercise Vital Sign    Days of Exercise per Week: 5 days    Minutes of Exercise per Session: 30 min  Stress: No Stress Concern Present (11/03/2021)   [employer] of Occupational Health - Occupational Stress Questionnaire    Feeling of Stress : Not at all  Social Connections: Moderately Integrated (03/23/2024)   Social Connection and Isolation Panel     Frequency of Communication with Friends and Family: More than three times a week    Frequency of Social Gatherings with Friends and Family: More than three times a week    Attends Religious Services: More than 4 times per year    Active Member of [employer] or Organizations: Yes    Attends Banker Meetings: More than 4 times per year    Marital Status: Widowed  Intimate Partner Violence: Not At Risk (03/23/2024)   Humiliation, Afraid, Rape, and Kick questionnaire    Fear of Current or Ex-Partner: No    Emotionally Abused: No    Physically Abused: No    Sexually Abused: No   Review of Systems: Gen: Denies fever, sweats or chills. No weight loss.  CV: Denies chest pain, palpitations or edema. Resp: Denies cough, shortness of breath of hemoptysis.  GI: See HPI. GU: + Recurrent UTIs. MS: Denies joint pain, muscles aches or weakness. Derm: Denies rash, itchiness, skin lesions or unhealing ulcers. Psych: Denies depression, anxiety, memory loss or confusion. Heme: Denies easy bruising, bleeding. Neuro:  Denies headaches, dizziness or paresthesias. Endo: + DM type II.   Physical Exam: Vital signs in last 24 hours: Temp:  [97.7 F (36.5 C)-98.3 F (36.8 C)] 98.2 F (36.8 C) (07/22 0406) Pulse Rate:  [69-110] 110 (07/22 0406) Resp:  [15-17] 15 (07/22 0406) BP: (130-152)/(58-76) 130/73 (07/22 0406) SpO2:  [91 %-100 %] 95 % (07/22 0406) FiO2 (%):  [28 %-31 %] 31 % (07/21 2349) Weight:  [53.7 kg] 53.7 kg (07/21 1630) Last BM Date : 03/23/24 General: Alert 83 year old female in no acute distress. Head: Normocephalic and atraumatic. Eyes:  No scleral icterus. Conjunctiva pink. Ears:  Normal auditory acuity. Nose:  No deformity, discharge or lesions. Mouth: Upper dentures in use.  No ulcers or lesions.  Neck:  Supple. No lymphadenopathy or thyromegaly.  Lungs: Breath sounds clear, slightly worse are in the bases.  On oxygen 3.5 L nasal cannula (oxygen saturations dropped when  sleeping) Heart: Regular rate and rhythm, systolic murmur. Abdomen: Soft, mild distention.  Mild tenderness throughout the lower abdomen and  below the umbilicus without rebound or guarding.  Positive bowel sounds to all 4 quadrants.  No palpable mass. Rectal: Deferred. Musculoskeletal:  Symmetrical without gross deformities.  Pulses:  Normal pulses noted. Extremities: Without clubbing or edema. Neurologic:  Alert and oriented x 4. No focal deficits.  Skin:  Intact without significant lesions or rashes. Psych:  Alert and cooperative. Normal mood and affect.  Intake/Output from previous day: 07/21 0701 - 07/22 0700 In: 100.1 [IV Piggyback:100.1] Out: 100 [Emesis/NG output:100] Intake/Output this shift: No intake/output data recorded.  Lab Results: Recent Labs    03/23/24 0950 03/24/24 0439  WBC 16.6* 12.8*  HGB 14.7 14.2  HCT 44.9 46.5*  PLT 469* 383   BMET Recent Labs    03/23/24 0950 03/24/24 0439  NA 137 135  K 4.5 4.4  CL 98 98  CO2 28 26  GLUCOSE 210* 201*  BUN 21 26*  CREATININE 0.92 0.94  CALCIUM  9.6 8.5*   LFT Recent Labs    03/23/24 0950  PROT 7.4  ALBUMIN 4.2  AST 19  ALT 26  ALKPHOS 91  BILITOT 0.7   PT/INR No results for input(s): LABPROT, INR in the last 72 hours. Hepatitis Panel No results for input(s): HEPBSAG, HCVAB, HEPAIGM, HEPBIGM in the last 72 hours.    Studies/Results: CT CHEST ABDOMEN PELVIS W CONTRAST Result Date: 03/23/2024 CLINICAL DATA:  Mid abdominal pain for the last 2-3 days. Reduced urinary output. Recent hospitalization for sepsis related to urinary tract infection. EXAM: CT CHEST, ABDOMEN, AND PELVIS WITH CONTRAST TECHNIQUE: Multidetector CT imaging of the chest, abdomen and pelvis was performed following the standard protocol during bolus administration of intravenous contrast. RADIATION DOSE REDUCTION: This exam was performed according to the departmental dose-optimization program which includes automated  exposure control, adjustment of the mA and/or kV according to patient size and/or use of iterative reconstruction technique. CONTRAST:  75mL OMNIPAQUE  IOHEXOL  350 MG/ML SOLN COMPARISON:  02/12/2024 FINDINGS: CT CHEST FINDINGS Cardiovascular: Coronary, aortic arch, and branch vessel atherosclerotic vascular disease. Aortic valve calcification. Mediastinum/Nodes: Unremarkable Lungs/Pleura: Left apical pleuroparenchymal scarring and nodularity with increased internal lucency in this nodularity compared to 11/23/2022, and overall stable from 02/12/2024. Adjacent fiducial noted. Correlate with prior biopsy/workup. 1.4 by 0.8 cm lachrymiform subpleural nodule medially in the right middle lobe on image 114 series 3, stable from 11/23/2022. This is likely benign but may merit surveillance. 0.4 cm right lower lobe nodule on image 96 series 3, no change from 11/23/2022, considered benign. Musculoskeletal: Degenerative glenohumeral arthropathy bilaterally. Cervical spine hardware noted. CT ABDOMEN PELVIS FINDINGS Hepatobiliary: Cholecystectomy.  Otherwise unremarkable. Pancreas: Unremarkable Spleen: Unremarkable Adrenals/Urinary Tract: Urinary bladder wall thickening may be related to nondistention but correlate with urine analysis. Stomach/Bowel: Loops small bowel the pelvis demonstrate bowel wall thickening and surrounding inflammatory stranding along with a small amount of ascites. Loops proximal to these thick-walled bowel loops are mildly dilated up to about 3.3 cm. Do not see a definite lead point for obstruction, transition from the thick-walled small bowel to nondistended and non thickened distal loops is fairly gradual. Appearance favors enteritis potentially with some local ileus over obstruction although surveillance is likely indicated particularly given the degree of mesenteric edema the mild ascites. Mild sigmoid colon diverticulosis. Vascular/Lymphatic: Atherosclerosis is present, including aortoiliac atherosclerotic  disease. Atheromatous plaque dorsally at the origins of the celiac trunk and SMA without critical stenosis or overt occlusion. Reproductive: Uterus absent. Other: Trace perisplenic ascites. Musculoskeletal: Right distal iliopsoas lipoma. Grade 1 anterolisthesis at L3-4 with left  paracentral and foraminal disc protrusion causing moderate left foraminal stenosis. IMPRESSION: 1. Loops of small bowel in the pelvis demonstrate bowel wall thickening and surrounding inflammatory stranding along with a small amount of ascites. Loops proximal to these thick-walled bowel loops are mildly dilated up to about 3.3 cm. I do not see a definite lead point for obstruction. Appearance favors enteritis potentially with some local ileus over obstruction although surveillance is likely indicated particularly given the degree of mesenteric edema the mild ascites. 2. Urinary bladder wall thickening may be related to nondistention but correlate with urine analysis. 3. Left apical pleuroparenchymal scarring and nodularity with increased internal lucency in this nodularity compared to 11/23/2022, and overall stable from 02/12/2024. Adjacent fiducial noted. Correlate with prior biopsy/workup. 4. 1.4 by 0.8 cm lachrymiform subpleural nodule medially in the right middle lobe, stable from 11/23/2022. This is likely benign but may merit surveillance. 5. Grade 1 anterolisthesis at L3-4 with left paracentral and foraminal disc protrusion causing moderate left foraminal stenosis. 6.  Aortic Atherosclerosis (ICD10-I70.0). Electronically Signed   By: Ryan Salvage M.D.   On: 03/23/2024 11:19    IMPRESSION/PLAN:  83 year old female admitted 03/10/2024 with nausea, vomiting and lower abdominal pain.  Admission WBC count 16.6.  Hemoglobin 14.7.  CTAP showed evidence of enteritis with mesenteric edema/ascites, etiology unclear.  No diarrhea. No bloody or black stools. Hemodynamically stable. - IV fluids and pain management per the hospitalist -  Continue Rocephin  IV daily - Continue soft diet for now, switch to clear liquids if vomiting recurs - Ondansetron  4 mg PR IV every 6 hours as needed - CBC, BMP, CRP and sed rate in a.m. - Fecal calprotectin level with next BM - Await further recommendations per Dr. Stacia  GERD - Pantoprazole  40 mg p.o. twice daily   History of colon polyps. Her most recent colonoscopy in 2017 identified one TA polyp removed from the colon. -No further colonoscopies recommended due to age    CAD s/p MI, s/p DES x 4 on ASA and Plavix .   HFpEF  Recent new onset A-fib with RVR during hospitalization 02/2024 on Eliquis   CKD stage IIIb  Recurrent UTI, recent hospitalization with UTI and sepsis 02/2024.  On Rocephin  IV daily.      [name] [name]  03/24/2024, 1:39 PM

## 2024-03-24 NOTE — Progress Notes (Signed)
   03/24/24 2229  BiPAP/CPAP/SIPAP  BiPAP/CPAP/SIPAP Pt Type Adult  Reason BIPAP/CPAP not in use Non-compliant

## 2024-03-24 NOTE — Care Management Obs Status (Signed)
 MEDICARE OBSERVATION STATUS NOTIFICATION   Patient Details  Name: AVARY EICHENBERGER MRN: [id number] Date of Birth: [birth year]   Medicare Observation Status Notification Given:  Yes    Doneta Glenys DASEN, RN 03/24/2024, 11:43 AM

## 2024-03-24 NOTE — TOC Initial Note (Addendum)
 Transition of Care [facility]) - Initial/Assessment Note    Patient Details  Name: [name] [name] MRN: [id number] Date of Birth: 1941/06/08  Transition of [facility]) CM/SW Contact:    Doneta Glenys DASEN, RN Phone Number:   Clinical Narrative:                 MOON completed.CM spoke with Rosaline daughter in-law. DME-cane, rollator. HH agency -     for PT and RN. Transport home will be family.CM called Rojelio Core [phone] and left message. TOC will follow. 12:46 PM CM spoke with [facility]. Not sure which [facility] agency. Give CM the RN number [phone] and left message.  Expected Discharge Plan: Home w Home Health Services Barriers to Discharge: Continued Medical Work up   Patient Goals and CMS Choice Patient states their goals for this hospitalization and ongoing recovery are:: Home with [facility] CMS Medicare.gov Compare Post Acute Care list provided to::  (NA) Choice offered to / list presented to : NA [place] ownership interest in [facility].provided to:: Parent NA    Expected Discharge Plan and Services In-house Referral: NA Discharge Planning Services: CM Consult Post Acute Care Choice: NA Living arrangements for the past 2 months: Single Family Home                 DME Arranged: N/A DME Agency: NA                  Prior Living Arrangements/Services Living arrangements for the past 2 months: Single Family Home Lives with:: Relatives Patient language and need for interpreter reviewed:: Yes Do you feel safe going back to the place where you live?: Yes      Need for Family Participation in Patient Care: No (Comment) Care giver support system in place?: Yes (comment) Current home services:  (Rollator cane) Criminal Activity/Legal Involvement Pertinent to Current Situation/Hospitalization: No - Comment as needed  Activities of Daily Living   ADL Screening (condition at time of admission) Independently performs ADLs?: Yes (appropriate for developmental  age) Is the patient deaf or have difficulty hearing?: No Does the patient have difficulty seeing, even when wearing glasses/contacts?: No Does the patient have difficulty concentrating, remembering, or making decisions?: No  Permission Sought/Granted Permission sought to share information with : Case Manager Permission granted to share information with : Yes, Verbal Permission Granted  Share Information with NAME: Core Rojelio (Daughter)  [phone] , Rosaline Chute daughter in-law           Emotional Assessment Appearance:: Appears stated age Attitude/Demeanor/Rapport: Engaged Affect (typically observed): Appropriate Orientation: : Oriented to Self, Oriented to Place, Oriented to  Time, Oriented to Situation Alcohol  / Substance Use: Not Applicable Psych Involvement: No (comment)  Admission diagnosis:  Enteritis [K52.9] Acute cystitis with hematuria [N30.01] Patient Active Problem List   Diagnosis Date Noted   Enteritis 03/23/2024   Paroxysmal atrial fibrillation (HCC) 03/02/2024   Protein-calorie malnutrition, severe 02/14/2024   Sepsis due to urinary tract infection (HCC) 02/12/2024   Lethargy 02/12/2024   Cellulitis and abscess of right lower extremity 02/14/2023   Chronic diastolic CHF (congestive heart failure) (HCC) 02/14/2023   Abscess 02/13/2023   Elevated lipase 09/27/2022   Anorexia 09/27/2022   Chronic hyponatremia 09/26/2022   Left leg swelling 05/18/2022   Acute post-operative pain 05/18/2022   Multiple pulmonary nodules 03/22/2022   UTI due to Klebsiella species 03/12/2022   Encounter for general adult medical examination with abnormal findings 10/04/2021   Hx of completed  stroke 05/12/2021   Coagulation disorder (HCC) 05/12/2021   Stage 3b chronic kidney disease (HCC) 09/13/2020   History of bacterial endocarditis 10/28/2018   Other seasonal allergic rhinitis 07/08/2014   Diabetic peripheral neuropathy (HCC) 06/29/2014   Essential hypertension  08/03/2009   Acute cystitis 11/10/2008   CAD (coronary artery disease) 01/14/2008   Dizziness 10/15/2007   DM2 (diabetes mellitus, type 2) (HCC) 08/15/2007   Hyperlipidemia 06/13/2007   Depression with anxiety 06/13/2007   GERD (gastroesophageal reflux disease) 06/13/2007   Osteoarthritis 06/13/2007   PCP:  Arloa Jarvis, NP Pharmacy:   [facility] [phone] [place] [place], [place] - [address] AT [facility] [address] [place] [address] Phone: [phone] Fax: [phone]  [facility] [place] [facility] [address] [place] [address] Phone: [phone] Fax: [phone]     Social Drivers of Health (SDOH) Social History: SDOH Screenings   Food Insecurity: No Food Insecurity (03/23/2024)  Housing: Low Risk  (03/23/2024)  Transportation Needs: No Transportation Needs (03/23/2024)  Utilities: Not At Risk (03/23/2024)  Alcohol  Screen: Low Risk  (11/03/2021)  Depression (PHQ2-9): Low Risk  (05/18/2022)  Financial Resource Strain: Low Risk  (11/03/2021)  Physical Activity: Sufficiently Active (11/03/2021)  Social Connections: Moderately Integrated (03/23/2024)  Stress: No Stress Concern Present (11/03/2021)  Tobacco Use: Low Risk  (03/23/2024)   SDOH Interventions:     Readmission Risk Interventions     No data to display

## 2024-03-24 NOTE — Plan of Care (Signed)

## 2024-03-24 NOTE — Hospital Course (Signed)
 [name] [name] is a 83 y.o. female with a history of CAD, bacterial endocarditis, hypertension, hyperlipidemia, diabetes mellitus type 2, paroxysmal atrial fibrillation.  Patient presented secondary to abdominal pain and dysuria and found to have evidence of small bowel enteritis in addition to evidence of UTI.  Unclear etiology for enteritis.  Patient start empirically on ceftriaxone  for coverage of UTI with cultures obtained.  Urine culture significant for Pseudomonas aeruginosa and P transition to cefepime .  Gastroenterology was consulted for assistance in management/diagnosis of small bowel enteritis.SABRA

## 2024-03-25 ENCOUNTER — Inpatient Hospital Stay (HOSPITAL_COMMUNITY)

## 2024-03-25 DIAGNOSIS — K529 Noninfective gastroenteritis and colitis, unspecified: Secondary | ICD-10-CM | POA: Diagnosis not present

## 2024-03-25 LAB — BASIC METABOLIC PANEL WITH GFR
Anion gap: 9 (ref 5–15)
BUN: 17 mg/dL (ref 8–23)
CO2: 30 mmol/L (ref 22–32)
Calcium: 8.6 mg/dL — ABNORMAL LOW (ref 8.9–10.3)
Chloride: 98 mmol/L (ref 98–111)
Creatinine, Ser: 0.52 mg/dL (ref 0.44–1.00)
GFR, Estimated: >60 mL/min (ref >60)
Glucose, Bld: 183 mg/dL — ABNORMAL HIGH (ref 70–99)
Potassium: 4.2 mmol/L (ref 3.5–5.1)
Sodium: 137 mmol/L (ref 135–145)

## 2024-03-25 LAB — URINE CULTURE: Culture: >=100000 — AB

## 2024-03-25 LAB — CBC
HCT: 42.9 % (ref 36.0–46.0)
Hemoglobin: 13 g/dL (ref 12.0–15.0)
MCH: 30.2 pg (ref 26.0–34.0)
MCHC: 30.3 g/dL (ref 30.0–36.0)
MCV: 99.5 fL (ref 80.0–100.0)
Platelets: 337 K/uL (ref 150–400)
RBC: 4.31 MIL/uL (ref 3.87–5.11)
RDW: 13.5 % (ref 11.5–15.5)
WBC: 9.5 K/uL (ref 4.0–10.5)
nRBC: 0 % (ref 0.0–0.2)

## 2024-03-25 LAB — C-REACTIVE PROTEIN: CRP: 0.9 mg/dL (ref <1.0)

## 2024-03-25 LAB — GLUCOSE, CAPILLARY
Glucose-Capillary: 168 mg/dL — ABNORMAL HIGH (ref 70–99)
Glucose-Capillary: 153 mg/dL — ABNORMAL HIGH (ref 70–99)
Glucose-Capillary: 170 mg/dL — ABNORMAL HIGH (ref 70–99)
Glucose-Capillary: 196 mg/dL — ABNORMAL HIGH (ref 70–99)

## 2024-03-25 LAB — SEDIMENTATION RATE: Sed Rate: 9 mm/h (ref 0–22)

## 2024-03-25 MED ORDER — PANTOPRAZOLE SODIUM 40 MG PO TBEC
40.0000 mg | DELAYED_RELEASE_TABLET | Freq: Two times a day (BID) | ORAL | Status: DC
  Administered 2024-03-25 – 2024-03-26 (×2): 40 mg via ORAL
  Filled 2024-03-25 (×2): qty 1

## 2024-03-25 MED ORDER — EZETIMIBE 10 MG PO TABS
10.0000 mg | ORAL_TABLET | Freq: Every day | ORAL | Status: DC
Start: 2024-03-25 — End: 2024-03-26
  Administered 2024-03-25 – 2024-03-26 (×2): 10 mg via ORAL
  Filled 2024-03-25 (×2): qty 1

## 2024-03-25 MED ORDER — METOPROLOL TARTRATE 50 MG PO TABS
50.0000 mg | ORAL_TABLET | Freq: Two times a day (BID) | ORAL | Status: DC
  Administered 2024-03-25 – 2024-03-26 (×2): 50 mg via ORAL
  Filled 2024-03-25 (×2): qty 1

## 2024-03-25 MED ORDER — INSULIN GLARGINE-YFGN 100 UNIT/ML ~~LOC~~ SOLN
10.0000 [IU] | Freq: Every day | SUBCUTANEOUS | Status: DC
  Filled 2024-03-25: qty 0.1

## 2024-03-25 MED ORDER — POLYETHYLENE GLYCOL 3350 17 G PO PACK
17.0000 g | PACK | Freq: Every day | ORAL | Status: DC
  Administered 2024-03-25: 17 g via ORAL
  Filled 2024-03-25 (×2): qty 1

## 2024-03-25 MED ORDER — METOPROLOL TARTRATE 25 MG PO TABS: 25.0000 mg | ORAL_TABLET | Freq: Two times a day (BID) | ORAL | Status: DC

## 2024-03-25 NOTE — Evaluation (Signed)
 Physical Therapy Evaluation Patient Details Name: [name] [name] MRN: [id number] DOB: 1941-03-26 Today's Date: 03/25/2024  History of Present Illness  Pt is 83 yo female admitted on 03/23/24 with small bowel enteritis and pseudomonas UTI.  Pt with hx including but not limited to DM2, HTN, HLD, CAD, afib on Eliquis , CVA, IBS, chronic pain.  Clinical Impression  Pt admitted with above diagnosis.  She has support and DME at home.  Pt was getting HHPT prior to admission.  Today, pt has been ambulating with family in the hallway.  She ambulated 400' with PT and supervision for lines.  Pt demonstrating safe balance and transfers.  Pt was on RA and tolerated activity well - see note below.  No further acute PT needs.      Pt had on 2 L O2 at arrival and self removed. Reports was told she may need when sleeping and need a sleep test.  After O2 off for at least 3 mins checked her sats. Initially reading 87% then dropping to 76% with good pleth but then quickly up to 97%  (< 10 seconds). Pt pleasant, alert, and not short of breath at all - question accuracy of initial readings as pt asymptomatic and increased in <10 sec still on RA.  With ambulation sats reading 91% on RA, pt no short of breath, up to 95% on RA after 1 min rest.       If plan is discharge home, recommend the following: Assistance with cooking/housework;Help with stairs or ramp for entrance   Can travel by private vehicle        Equipment Recommendations None recommended by PT  Recommendations for Other Services       Functional Status Assessment Patient has not had a recent decline in their functional status     Precautions / Restrictions Precautions Precautions: Fall      Mobility  Bed Mobility Overal bed mobility: Needs Assistance Bed Mobility: Supine to Sit, Sit to Supine     Supine to sit: Modified independent (Device/Increase time) Sit to supine: Modified independent (Device/Increase time)         Transfers Overall transfer level: Needs assistance Equipment used: Rolling walker (2 wheels), None Transfers: Sit to/from Stand Sit to Stand: Supervision                Ambulation/Gait Ambulation/Gait assistance: Supervision Gait Distance (Feet): 400 Feet Assistive device: Rolling walker (2 wheels), None Gait Pattern/deviations: Step-through pattern Gait velocity: normal     General Gait Details: Supervision for lines; ambulated in hall with RW and room without ; steady no LOB  Stairs            Wheelchair Mobility     Tilt Bed    Modified Rankin (Stroke Patients Only)       Balance Overall balance assessment: Needs assistance Sitting-balance support: No upper extremity supported Sitting balance-Leahy Scale: Good     Standing balance support: No upper extremity supported Standing balance-Leahy Scale: Good Standing balance comment: able to ambualte in room without AD                             Pertinent Vitals/Pain Pain Assessment Pain Assessment: No/denies pain    Home Living Family/patient expects to be discharged to:: Private residence Living Arrangements: Other relatives (granddaughter and family) Available Help at Discharge: Family;Available 24 hours/day Type of Home: House Home Access: Level entry       Home  Layout: One level Home Equipment: Grab bars - tub/shower;Rollator (4 wheels);Cane - single point;Shower Counsellor (2 wheels) Additional Comments: Has been getting HHPT this last month    Prior Function Prior Level of Function : Independent/Modified Independent;History of Falls (last six months)             Mobility Comments: Can normally ambulate in community and does shopping.  Has been use RW or cane some ADLs Comments: Mod I for ADLs, assists with some IADLs     Extremity/Trunk Assessment   Upper Extremity Assessment Upper Extremity Assessment: Overall WFL for tasks assessed    Lower Extremity  Assessment Lower Extremity Assessment: Overall WFL for tasks assessed    Cervical / Trunk Assessment Cervical / Trunk Assessment: Kyphotic  Communication        Cognition Arousal: Alert Behavior During Therapy: WFL for tasks assessed/performed   PT - Cognitive impairments: No apparent impairments                                 Cueing       General Comments      Exercises     Assessment/Plan    PT Assessment Patient does not need any further PT services  PT Problem List         PT Treatment Interventions      PT Goals (Current goals can be found in the Care Plan section)  Acute Rehab PT Goals Patient Stated Goal: return home PT Goal Formulation: All assessment and education complete, DC therapy    Frequency       Co-evaluation               AM-PAC PT 6 Clicks Mobility  Outcome Measure Help needed turning from your back to your side while in a flat bed without using bedrails?: None Help needed moving from lying on your back to sitting on the side of a flat bed without using bedrails?: None Help needed moving to and from a bed to a chair (including a wheelchair)?: None Help needed standing up from a chair using your arms (e.g., wheelchair or bedside chair)?: None Help needed to walk in hospital room?: None Help needed climbing 3-5 steps with a railing? : A Little 6 Click Score: 23    End of Session Equipment Utilized During Treatment: Gait belt Activity Tolerance: Patient tolerated treatment well Patient left: in bed;with call bell/phone within reach;Other (comment) (wanted to sit EOB, aware to call for assist) Nurse Communication: Mobility status PT Visit Diagnosis: Other abnormalities of gait and mobility (R26.89)    Time: 8361-8343 PT Time Calculation (min) (ACUTE ONLY): 18 min   Charges:   PT Evaluation $PT Eval Low Complexity: 1 Low   PT General Charges $$ ACUTE PT VISIT: 1 Visit         Benjiman, PT Acute Rehab  [facility] [phone]   Benjiman VEAR Mulberry 03/25/2024, 5:25 PM

## 2024-03-25 NOTE — Progress Notes (Signed)
 [facility] Progress Note  CC:  Abdominal pain, enteritis with mesenteric edema    Subjective: She ate [place] sausage and toast this morning and about 30 minutes later noticed her lower abdominal pain was increasing a little then worsened after she walked 2 laps in the hall. She rates her abdominal pain a 6 on scale of 1 to 10. She does not wish to take any pain meds at this time. Last BM was 2 days ago. She is passing gas per the rectum. She had N/V yesterday am without further recurrence. No CP or SOB. Daughter in-law at the bedside.    Objective:  Vital signs in last 24 hours: Temp:  [98.3 F (36.8 C)-98.4 F (36.9 C)] 98.3 F (36.8 C) (07/23 0626) Pulse Rate:  [84-92] 84 (07/23 0626) Resp:  [18] 18 (07/23 0626) BP: (148-154)/(63-71) 154/71 (07/23 0626) SpO2:  [98 %] 98 % (07/23 0626) Last BM Date : 03/23/24 General: Alert 83 year old female in NAD. Heart: Regular rate and rhythm, systolic murmur.  Pulm: Breath sounds clear throughout. On room air.  Abdomen: Soft. Mild distended. Generalized tenderness with more pronounced tenderness to the lower abdomen without rebound or guarding.Hypoactive bowel sounds x 4 quadrants. No bruit. No palpable mass.  Extremities:  No edema. Neurologic:  Alert and  oriented x 4. Grossly normal neurologically. Psych:  Alert and cooperative. Normal mood and affect.  Intake/Output from previous day: 07/22 0701 - 07/23 0700 In: 1786.7 [P.O.:240; I.V.:1446.7; IV Piggyback:100] Out: -  Intake/Output this shift: No intake/output data recorded.  Lab Results: Recent Labs    03/23/24 0950 03/24/24 0439 03/25/24 0650  WBC 16.6* 12.8* 9.5  HGB 14.7 14.2 13.0  HCT 44.9 46.5* 42.9  PLT 469* 383 337   BMET Recent Labs    03/23/24 0950 03/24/24 0439 03/25/24 0650  NA 137 135 137  K 4.5 4.4 4.2  CL 98 98 98  CO2 28 26 30   GLUCOSE 210* 201* 183*  BUN 21 26* 17  CREATININE 0.92 0.94 0.52  CALCIUM  9.6 8.5* 8.6*    LFT Recent Labs    03/23/24 0950  PROT 7.4  ALBUMIN 4.2  AST 19  ALT 26  ALKPHOS 91  BILITOT 0.7   PT/INR No results for input(s): LABPROT, INR in the last 72 hours. Hepatitis Panel No results for input(s): HEPBSAG, HCVAB, HEPAIGM, HEPBIGM in the last 72 hours.  CT CHEST ABDOMEN PELVIS W CONTRAST Result Date: 03/23/2024 CLINICAL DATA:  Mid abdominal pain for the last 2-3 days. Reduced urinary output. Recent hospitalization for sepsis related to urinary tract infection. EXAM: CT CHEST, ABDOMEN, AND PELVIS WITH CONTRAST TECHNIQUE: Multidetector CT imaging of the chest, abdomen and pelvis was performed following the standard protocol during bolus administration of intravenous contrast. RADIATION DOSE REDUCTION: This exam was performed according to the departmental dose-optimization program which includes automated exposure control, adjustment of the mA and/or kV according to patient size and/or use of iterative reconstruction technique. CONTRAST:  75mL OMNIPAQUE  IOHEXOL  350 MG/ML SOLN COMPARISON:  02/12/2024 FINDINGS: CT CHEST FINDINGS Cardiovascular: Coronary, aortic arch, and branch vessel atherosclerotic vascular disease. Aortic valve calcification. Mediastinum/Nodes: Unremarkable Lungs/Pleura: Left apical pleuroparenchymal scarring and nodularity with increased internal lucency in this nodularity compared to 11/23/2022, and overall stable from 02/12/2024. Adjacent fiducial noted. Correlate with prior biopsy/workup. 1.4 by 0.8 cm lachrymiform subpleural nodule medially in the right middle lobe on image 114 series 3, stable from 11/23/2022. This is likely benign but may merit surveillance. 0.4  cm right lower lobe nodule on image 96 series 3, no change from 11/23/2022, considered benign. Musculoskeletal: Degenerative glenohumeral arthropathy bilaterally. Cervical spine hardware noted. CT ABDOMEN PELVIS FINDINGS Hepatobiliary: Cholecystectomy.  Otherwise unremarkable. Pancreas: Unremarkable  Spleen: Unremarkable Adrenals/Urinary Tract: Urinary bladder wall thickening may be related to nondistention but correlate with urine analysis. Stomach/Bowel: Loops small bowel the pelvis demonstrate bowel wall thickening and surrounding inflammatory stranding along with a small amount of ascites. Loops proximal to these thick-walled bowel loops are mildly dilated up to about 3.3 cm. Do not see a definite lead point for obstruction, transition from the thick-walled small bowel to nondistended and non thickened distal loops is fairly gradual. Appearance favors enteritis potentially with some local ileus over obstruction although surveillance is likely indicated particularly given the degree of mesenteric edema the mild ascites. Mild sigmoid colon diverticulosis. Vascular/Lymphatic: Atherosclerosis is present, including aortoiliac atherosclerotic disease. Atheromatous plaque dorsally at the origins of the celiac trunk and SMA without critical stenosis or overt occlusion. Reproductive: Uterus absent. Other: Trace perisplenic ascites. Musculoskeletal: Right distal iliopsoas lipoma. Grade 1 anterolisthesis at L3-4 with left paracentral and foraminal disc protrusion causing moderate left foraminal stenosis. IMPRESSION: 1. Loops of small bowel in the pelvis demonstrate bowel wall thickening and surrounding inflammatory stranding along with a small amount of ascites. Loops proximal to these thick-walled bowel loops are mildly dilated up to about 3.3 cm. I do not see a definite lead point for obstruction. Appearance favors enteritis potentially with some local ileus over obstruction although surveillance is likely indicated particularly given the degree of mesenteric edema the mild ascites. 2. Urinary bladder wall thickening may be related to nondistention but correlate with urine analysis. 3. Left apical pleuroparenchymal scarring and nodularity with increased internal lucency in this nodularity compared to 11/23/2022, and  overall stable from 02/12/2024. Adjacent fiducial noted. Correlate with prior biopsy/workup. 4. 1.4 by 0.8 cm lachrymiform subpleural nodule medially in the right middle lobe, stable from 11/23/2022. This is likely benign but may merit surveillance. 5. Grade 1 anterolisthesis at L3-4 with left paracentral and foraminal disc protrusion causing moderate left foraminal stenosis. 6.  Aortic Atherosclerosis (ICD10-I70.0). Electronically Signed   By: Ryan Salvage M.D.   On: 03/23/2024 11:19    Patient Profile: 83 year old female with a past medical history of anxiety, CAD s/p MI 2007, s/p DES 2007, 2018, 2021 and 2022 on Plavix , HFpEF, bacterial endocarditis, atrial fibrillation on Eliquis , mini CVA, diabetes mellitus type 2, CKD stage IIIb, left lung nodule, GERD, ischemic colitis per colonoscopy 12/2012, colon polyps and internal hemorrhoids s/p hemorrhoidal banding x 3 in 2022. Past appendectomy and cholecystectomy. Previously admitted to the hospital 6/11 - 02/19/2024 with UTI and sepsis. Admitted 03/23/2024 with N/V and lower abdominal pain.   Assessment / Plan:  83 year old female admitted 03/10/2024 with N/V and lower abdominal pain. Admission WBC count 16.6 -> 12.8 -> 9.5.  Hemoglobin 14.7 -> 13.  CRP 0.9. Sed rate 9. CTAP showed evidence of enteritis with mesenteric edema/ascites, suspect acute infectious enteritis with low suspicion for Crohn's disease or mesenteric ischemia. She had one episode of N/V yesterday am without recurrence. No recent diarrhea. No BM since admission. Abdominal pain worsened this morning after eating breakfast and walking in the hall.  Afebrile. Hemodynamically stable. - IV fluids and pain management per the hospitalist - Continue Rocephin  IV daily - Continue soft diet for now, switch to clear liquids if vomiting recurs - Ondansetron  4 mg PR IV every 6 hours as needed -  CBC, BMP, CRP and sed rate in a.m. - Fecal calprotectin level with next BM - Abdominal xray this am -  Eventual repeat CTAP as an outpatient - Await further recommendations per Dr. Stacia   GERD - Pantoprazole  40 mg p.o. twice daily   History of colon polyps. Her most recent colonoscopy in 2017 identified one TA polyp removed from the colon. -No further colonoscopies recommended due to age    CAD s/p MI, s/p DES x 4 on ASA and Plavix .    HFpEF   Recent new onset A-fib with RVR during hospitalization 02/2024 on Eliquis    CKD stage IIIb   Recurrent UTI, recent hospitalization with UTI and sepsis 02/2024.  On Rocephin  IV daily.    Principal Problem:   Enteritis     LOS: 1 day   Elida CHRISTELLA Shawl  03/25/2024, 11:29 AM

## 2024-03-25 NOTE — Progress Notes (Signed)
 PROGRESS NOTE  [name] [name]  DOB: 06-08-1941  PCP: Arloa Jarvis, NP FMW:[id number]  DOA: 03/23/2024  LOS: 1 day  Hospital Day: 3  Brief narrative: [name] [name] is a 83 y.o. female with PMH significant for DM2, HTN, HLD, CAD/stents, A-fib on Eliquis , CVA, IBS, chronic pain 7/21, patient presented to the ED with complaint of abdominal pain, nausea, vomiting dysuria.  Workup showed WC count 16.6, renal function normal, LFTs normal Urinalysis positive for leukocytes and nitrite. CT abdomen showed small bowel thickening, mesenteric edema, small ascites and some prominent bowel loops suggestive of enteritis. Patient was started on IV Rocephin  Admitted to [facility] GI was consulted Urine culture sent on admission was significant for Pseudomonas aeruginosa and hence antibiotic transition to IV cefepime   Subjective: Patient was seen and examined this afternoon.  Pleasant elderly Caucasian female.  Propped up in bed.  Not in distress.  No new symptoms.   Chart reviewed In the last 24 hours, afebrile, heart rate in 90s, blood pressure 150s, breathing on 3 L oxygen.  Does not use oxygen at home. Most recent blood work from this morning showing normal CBC, normal renal function, glucose 183 Repeat x-ray abdomen showed moderate stool burden, mildly dilated loop of small bowel in the mid abdomen -ileus versus SBO  Assessment and plan: Small bowel enteritis Presented with nausea, vomiting, abdominal pain WBC count was elevated CT scan showed evidence of enteritis with concentric edema, unclear etiology GI following Currently on IV Rocephin  No intervention needed at this time.  Tolerating soft diet.   Pseudomonas UTI Urine culture significant for pseudomonas aeruginosa. On IV cefepime   Type 2 diabetes mellitus A1c 7.3 on 02/12/2024 PTA meds-Lantus  10 units daily, NovoLog  sliding scale Premeal Currently on SSI/Accu-Cheks.  Blood sugar trend as below Patient says she was started on Lantus   only in June.  Her A1c is controlled less than 8 which is appropriate for her age.  I would like to avoid long-acting insulin .  Monitor Recent Labs  Lab 03/24/24 1512 03/24/24 2030 03/25/24 0730 03/25/24 1157 03/25/24 1624  GLUCAP 252* 167* 168* 153* 170*   CAD/HLD Continue Eliquis , Plavix , Lipitor , Zetia    Paroxysmal atrial fibrillation PTA meds- metoprolol  100 mg twice daily Resume metoprolol  at a lower dose of 50 mg twice daily Continue chronic anticoagulation with Eliquis    Chronic HFpEF HTN Metoprolol  plan as above.  Not on diuretics.   GERD Continue Protonix    Neuropathy Continue gabapentin    Nocturnal apnea Noted. Patient recommended for outpatient sleep study. CPAP ordered for nighttime this admission. Continue CPAP  Anxiety/depression Effexor      Mobility: Encourage ambulation  Goals of care   Code Status: Limited: Do not attempt resuscitation (DNR) -DNR-LIMITED -Do Not Intubate/DNI      DVT prophylaxis:  apixaban  (ELIQUIS ) tablet 2.5 mg Start: 03/23/24 2200 apixaban  (ELIQUIS ) tablet 2.5 mg   Antimicrobials: IV cefepime  Fluid: None Consultants: GI Family Communication: Family not at bedside  Status: Inpatient Level of care:  Med-Surg   Patient is from: Home Needs to continue inhospital care: On IV antibiotics Anticipated d/c to: Pending PT recommendation, hopefully home in 1 to 2 days     Diet:  Diet Order             DIET SOFT Room service appropriate? Yes; Fluid consistency: Thin  Diet effective now                   Scheduled Meds:  apixaban   2.5 mg Oral BID  atorvastatin   80 mg Oral Daily   clopidogrel   75 mg Oral q morning   ezetimibe   10 mg Oral Daily   gabapentin   300 mg Oral TID   insulin  aspart  0-15 Units Subcutaneous TID WC   insulin  aspart  0-5 Units Subcutaneous QHS   metoprolol  tartrate  25 mg Oral BID   metoprolol  tartrate  50 mg Oral BID   pantoprazole   40 mg Oral BID   polyethylene glycol  17 g Oral Daily    venlafaxine  XR  37.5 mg Oral Daily    PRN meds: acetaminophen  **OR** acetaminophen , albuterol , HYDROmorphone  (DILAUDID ) injection, ondansetron  **OR** ondansetron  (ZOFRAN ) IV, oxyCODONE    Infusions:   ceFEPime  (MAXIPIME ) IV 1 g (03/25/24 1615)    Antimicrobials: Anti-infectives (From admission, onward)    Start     Dose/Rate Route Frequency Ordered Stop   03/24/24 1630  ceFEPIme  (MAXIPIME ) 1 g in sodium chloride  0.9 % 100 mL IVPB        1 g 200 mL/hr over 30 Minutes Intravenous Every 12 hours 03/24/24 1528     03/23/24 1800  cefTRIAXone  (ROCEPHIN ) 1 g in sodium chloride  0.9 % 100 mL IVPB  Status:  Discontinued        1 g 200 mL/hr over 30 Minutes Intravenous Every 24 hours 03/23/24 1631 03/24/24 1528   03/23/24 1145  ceFEPIme  (MAXIPIME ) 2 g in sodium chloride  0.9 % 100 mL IVPB        2 g 200 mL/hr over 30 Minutes Intravenous  Once 03/23/24 1142 03/23/24 1242       Objective: Vitals:   03/25/24 0626 03/25/24 1226  BP: (!) 154/71 (!) 156/70  Pulse: 84 96  Resp: 18 16  Temp: 98.3 F (36.8 C) 98.1 F (36.7 C)  SpO2: 98% 94%    Intake/Output Summary (Last 24 hours) at 03/25/2024 1646 Last data filed at 03/25/2024 1605 Gross per 24 hour  Intake 420 ml  Output --  Net 420 ml   Filed Weights   03/23/24 1630  Weight: 53.7 kg   Weight change:  Body mass index is 23.12 kg/m.   Physical Exam: General exam: Pleasant, elderly Caucasian female.  Not in distress Skin: No rashes, lesions or ulcers. HEENT: Atraumatic, normocephalic, no obvious bleeding Lungs: Clear to auscultation bilaterally,  CVS: S1, S2, no murmur,   GI/Abd: Soft, mild diffuse tenderness, nondistended, bowel sound present,   CNS: Alert, awake, oriented x 3 Psychiatry: Mood appropriate Extremities: No pedal edema, no calf tenderness,   Data Review: I have personally reviewed the laboratory data and studies available.  F/u labs ordered Unresulted Labs (From admission, onward)     Start     Ordered    03/26/24 0500  CBC with Differential/Platelet  Tomorrow morning,   R        03/25/24 1606   03/26/24 0500  Basic metabolic panel with GFR  Tomorrow morning,   R        03/25/24 1606   03/24/24 1418  Calprotectin, Fecal  Once,   R        03/24/24 1417            Signed, Chapman Rota, MD [facility] 03/25/2024

## 2024-03-25 NOTE — Plan of Care (Signed)

## 2024-03-26 ENCOUNTER — Other Ambulatory Visit (HOSPITAL_COMMUNITY): Payer: Self-pay

## 2024-03-26 DIAGNOSIS — K529 Noninfective gastroenteritis and colitis, unspecified: Secondary | ICD-10-CM | POA: Diagnosis not present

## 2024-03-26 LAB — CBC WITH DIFFERENTIAL/PLATELET
Abs Immature Granulocytes: 0.04 K/uL (ref 0.00–0.07)
Basophils Absolute: 0.1 K/uL (ref 0.0–0.1)
Basophils Relative: 1 %
Eosinophils Absolute: 0.3 K/uL (ref 0.0–0.5)
Eosinophils Relative: 3 %
HCT: 42.2 % (ref 36.0–46.0)
Hemoglobin: 13 g/dL (ref 12.0–15.0)
Immature Granulocytes: 0 %
Lymphocytes Relative: 26 %
Lymphs Abs: 2.4 K/uL (ref 0.7–4.0)
MCH: 30.4 pg (ref 26.0–34.0)
MCHC: 30.8 g/dL (ref 30.0–36.0)
MCV: 98.6 fL (ref 80.0–100.0)
Monocytes Absolute: 1.1 K/uL — ABNORMAL HIGH (ref 0.1–1.0)
Monocytes Relative: 12 %
Neutro Abs: 5.3 K/uL (ref 1.7–7.7)
Neutrophils Relative %: 58 %
Platelets: 349 K/uL (ref 150–400)
RBC: 4.28 MIL/uL (ref 3.87–5.11)
RDW: 13.4 % (ref 11.5–15.5)
WBC: 9.1 K/uL (ref 4.0–10.5)
nRBC: 0 % (ref 0.0–0.2)

## 2024-03-26 LAB — BASIC METABOLIC PANEL WITH GFR
Anion gap: 9 (ref 5–15)
BUN: 18 mg/dL (ref 8–23)
CO2: 31 mmol/L (ref 22–32)
Calcium: 8.9 mg/dL (ref 8.9–10.3)
Chloride: 94 mmol/L — ABNORMAL LOW (ref 98–111)
Creatinine, Ser: 0.78 mg/dL (ref 0.44–1.00)
GFR, Estimated: >60 mL/min (ref >60)
Glucose, Bld: 149 mg/dL — ABNORMAL HIGH (ref 70–99)
Potassium: 4.6 mmol/L (ref 3.5–5.1)
Sodium: 134 mmol/L — ABNORMAL LOW (ref 135–145)

## 2024-03-26 LAB — GLUCOSE, CAPILLARY: Glucose-Capillary: 149 mg/dL — ABNORMAL HIGH (ref 70–99)

## 2024-03-26 LAB — MAGNESIUM: Magnesium: 1.9 mg/dL (ref 1.7–2.4)

## 2024-03-26 MED ORDER — CIPROFLOXACIN HCL 500 MG PO TABS: 500.0000 mg | ORAL_TABLET | Freq: Two times a day (BID) | ORAL | Status: DC

## 2024-03-26 MED ORDER — METRONIDAZOLE 500 MG PO TABS
500.0000 mg | ORAL_TABLET | Freq: Two times a day (BID) | ORAL | 0 refills | Status: DC
  Filled 2024-03-26: qty 10, 5d supply, fill #0

## 2024-03-26 MED ORDER — METRONIDAZOLE 500 MG PO TABS
500.0000 mg | ORAL_TABLET | Freq: Two times a day (BID) | ORAL | 0 refills | Status: AC
  Filled 2024-03-26: qty 10, 5d supply, fill #0

## 2024-03-26 MED ORDER — CIPROFLOXACIN HCL 500 MG PO TABS
500.0000 mg | ORAL_TABLET | Freq: Two times a day (BID) | ORAL | 0 refills | Status: AC
  Filled 2024-03-26: qty 10, 5d supply, fill #0

## 2024-03-26 MED ORDER — METRONIDAZOLE 500 MG PO TABS
500.0000 mg | ORAL_TABLET | Freq: Two times a day (BID) | ORAL | Status: DC
  Administered 2024-03-26: 500 mg via ORAL
  Filled 2024-03-26: qty 1

## 2024-03-26 MED ORDER — CIPROFLOXACIN HCL 500 MG PO TABS
500.0000 mg | ORAL_TABLET | Freq: Two times a day (BID) | ORAL | 0 refills | Status: DC
  Filled 2024-03-26: qty 10, 5d supply, fill #0

## 2024-03-26 NOTE — Progress Notes (Signed)
 DC instructions and paperwork provided. All questions answered.

## 2024-03-26 NOTE — TOC Transition Note (Signed)
 Transition of Care [facility]) - Discharge Note   Patient Details  Name: [name] [name] MRN: [id number] Date of Birth: [birth year]  Transition of [facility]) CM/SW Contact:  Doneta Glenys DASEN, RN Phone Number: 03/26/2024, 12:25 PM   Clinical Narrative:    Patient is medically ready for discharge per MD. Patient will return home with [facility] with RN, PT/OT. Patient will be transported home by Neil Cable niece [phone]. TOC signing off.   Final next level of care: Home w Home Health Services Barriers to Discharge: Barriers Resolved   Patient Goals and CMS Choice Patient states their goals for this hospitalization and ongoing recovery are:: Home with [facility] CMS Medicare.gov Compare Post Acute Care list provided to::  (NA) Choice offered to / list presented to : NA [place] ownership interest in [facility].provided to:: Parent NA    Discharge Placement                  Name of family member notified: Rojelio Patient and family notified of of transfer: 03/26/24  Discharge Plan and Services Additional resources added to the After Visit Summary for   In-house Referral: NA Discharge Planning Services: CM Consult Post Acute Care Choice: NA          DME Arranged: N/A DME Agency: NA       HH Arranged: RN, PT, OT HH Agency: [facility] Date HH Agency Contacted: 03/24/24 Time HH Agency Contacted: 1220 Representative spoke with at [facility] Agency: [place] via text  Social Drivers of Health (SDOH) Interventions SDOH Screenings   Food Insecurity: No Food Insecurity (03/23/2024)  Housing: Low Risk  (03/23/2024)  Transportation Needs: No Transportation Needs (03/23/2024)  Utilities: Not At Risk (03/23/2024)  Alcohol  Screen: Low Risk  (11/03/2021)  Depression (PHQ2-9): Low Risk  (05/18/2022)  Financial Resource Strain: Low Risk  (11/03/2021)  Physical Activity: Sufficiently Active (11/03/2021)  Social Connections: Moderately Integrated (03/23/2024)  Stress: No  Stress Concern Present (11/03/2021)  Tobacco Use: Low Risk  (03/23/2024)     Readmission Risk Interventions     No data to display

## 2024-03-26 NOTE — Discharge Summary (Signed)
 Physician Discharge Summary  [name] [name] FMW:[id number] DOB: [birth year] DOA: 03/23/2024  PCP: Arloa Jarvis, NP  Admit date: 03/23/2024 Discharge date: 03/26/2024  Admitted from: home Discharge disposition: home  Recommendations at discharge:  Complete the course of antibiotics with 5 more days of oral ciprofloxacin  and oral Flagyl .  Brief narrative: [name] [name] is a 83 y.o. female with PMH significant for DM2, HTN, HLD, CAD/stents, A-fib on Eliquis , CVA, IBS, chronic pain 7/21, patient presented to the ED with complaint of abdominal pain, nausea, vomiting dysuria.  Workup showed WC count 16.6, renal function normal, LFTs normal Urinalysis positive for leukocytes and nitrite. CT abdomen showed small bowel thickening, mesenteric edema, small ascites and some prominent bowel loops suggestive of enteritis. Patient was started on IV Rocephin  Admitted to [facility] was consulted Urine culture sent on admission was significant for Pseudomonas aeruginosa and hence antibiotic transition to IV cefepime   Subjective: Patient was seen and examined this morning.  Feels good.  Not in distress.  No abdominal pain or diarrhea.  Could not sleep well last night because of bed being uncomfortable.  Assessment and plan: Small bowel enteritis Presented with nausea, vomiting, abdominal pain WBC count was elevated CT scan showed evidence of enteritis with concentric edema, unclear etiology GI consult was obtained Currently on IV cefepime .  Given coexisting UTI, switch to oral Cipro  and oral Flagyl  for next 5 days at discharge No intervention needed at this time.  Tolerating soft diet.   Repeat x-ray abdomen showed moderate stool burden, mildly dilated loop of small bowel in the mid abdomen -ileus versus SBO   Pseudomonas UTI Urine culture significant for pseudomonas aeruginosa. On IV cefepime . Switched to oral Cipro  today.  Type 2 diabetes mellitus A1c 7.3 on 02/12/2024 PTA meds-Lantus  10  units daily, NovoLog  sliding scale Premeal Currently on SSI/Accu-Cheks.  Blood sugar trend as below Patient says she was started on Lantus  only in June.  Her A1c is controlled less than 8 which is appropriate for her age.  I would like to avoid insulin . Patient loves that idea. Recent Labs  Lab 03/25/24 0730 03/25/24 1157 03/25/24 1624 03/25/24 2027 03/26/24 0804  GLUCAP 168* 153* 170* 196* 149*   CAD/HLD Continue Eliquis , Plavix , Lipitor , Zetia    Paroxysmal atrial fibrillation Continue metoprolol  100 mg twice daily as before Continue chronic anticoagulation with Eliquis    Chronic HFpEF HTN On metoprolol  and amlodipine  at home.  Continue the same   GERD Continue Protonix    Neuropathy Continue gabapentin    Nocturnal apnea Noted. Patient recommended for outpatient sleep study. CPAP ordered for nighttime this admission. Continue CPAP  Anxiety/depression Effexor      Mobility: Independently able to ambulate  Goals of care   Code Status: Limited: Do not attempt resuscitation (DNR) -DNR-LIMITED -Do Not Intubate/DNI    Diet:  Diet Order             Diet general           DIET SOFT Room service appropriate? Yes; Fluid consistency: Thin  Diet effective now                   Nutritional status:  Body mass index is 23.12 kg/m.       Wounds: - Wound / Incision (Open or Dehisced) 04/23/14  Arm Right (Active)  Date First Assessed/Time First Assessed: 04/23/14 1728   Wound Type: (c)   Location: Arm  Location Orientation: Right  Present on Admission: Yes    No assessment data to  display     No associated orders.     Wound / Incision (Open or Dehisced) 04/23/14  Arm Left (Active)  Date First Assessed/Time First Assessed: 04/23/14 1728   Wound Type: (c)   Location: Arm  Location Orientation: Left  Present on Admission: Yes    No assessment data to display     No associated orders.     Wound / Incision (Open or Dehisced) 04/23/14 Laceration Finger (Comment  which one) Right (Active)  Date First Assessed/Time First Assessed: 04/23/14 1729   Wound Type: Laceration  Location: (c) Finger (Comment which one)  Location Orientation: Right  Present on Admission: Yes    Assessments 04/23/2014  7:37 PM  Dressing Type Non adherent  Dressing Changed New  Dressing Status Clean;Dry;Intact     No associated orders.     Wound / Incision (Open or Dehisced) Other (Comment) Thigh Anterior;Right Fistula/cellulitis (Active)  No Date First Assessed or Time First Assessed found.   Wound Type: (c) Other (Comment)  Location: Thigh  Location Orientation: Anterior;Right  Wound Description (Comments): Fistula/cellulitis    Assessments 02/14/2023  4:00 PM 02/16/2023  8:52 AM  Dressing Type Gauze (Comment);Moist to dry --  Dressing Changed Changed --  Dressing Status New drainage;Old drainage Clean, Dry, Intact  Site / Wound Assessment Pink;Red;Yellow --  % Wound base Red or Granulating 0% --  % Wound base Yellow/Fibrinous Exudate 5% --  % Wound base Black/Eschar 0% --  Peri-wound Assessment Edema;Pink --  Tunneling (cm) yes --  Margins Unattached edges (unapproximated) --  Closure None --  Drainage Amount Minimal --  Drainage Description Serosanguineous --  Treatment Cleansed --     No associated orders.    Discharge Exam:   Vitals:   03/25/24 0626 03/25/24 1226 03/25/24 2032 03/26/24 0444  BP: (!) 154/71 (!) 156/70 (!) 159/71 (!) 141/68  Pulse: 84 96 77 70  Resp: 18 16 20 18   Temp: 98.3 F (36.8 C) 98.1 F (36.7 C) 98.6 F (37 C) 97.8 F (36.6 C)  TempSrc: Oral     SpO2: 98% 94% 97% 98%  Weight:      Height:        Body mass index is 23.12 kg/m.   General exam: Pleasant, elderly Caucasian female.  Not in distress Skin: No rashes, lesions or ulcers. HEENT: Atraumatic, normocephalic, no obvious bleeding Lungs: Clear to auscultation bilaterally,  CVS: S1, S2, no murmur,   GI/Abd: Soft, no tenderness, nondistended, bowel sound present,   CNS:  Alert, awake, oriented x 3 Psychiatry: Mood appropriate Extremities: No pedal edema, no calf tenderness,   Follow ups:    Follow-up Information     Arloa Jarvis, NP Follow up.   Specialty: Nurse Practitioner Contact information: [address] [place] [address] [phone]                 Discharge Instructions:   Discharge Instructions     Call MD for:  difficulty breathing, headache or visual disturbances   Complete by: As directed    Call MD for:  extreme fatigue   Complete by: As directed    Call MD for:  hives   Complete by: As directed    Call MD for:  persistant dizziness or light-headedness   Complete by: As directed    Call MD for:  persistant nausea and vomiting   Complete by: As directed    Call MD for:  severe uncontrolled pain   Complete by:  As directed    Call MD for:  temperature >100.4   Complete by: As directed    Diet general   Complete by: As directed    Discharge instructions   Complete by: As directed    Recommendations at discharge:   Complete the course of antibiotics with 5 more days of oral ciprofloxacin  and oral Flagyl .  General discharge instructions: Follow with Primary MD Arloa Jarvis, NP in 7 days  Please request your PCP  to go over your hospital tests, procedures, radiology results at the follow up. Please get your medicines reviewed and adjusted.  Your PCP may decide to repeat certain labs or tests as needed. Do not drive, operate heavy machinery, perform activities at heights, swimming or participation in water activities or provide baby sitting services if your were admitted for syncope or siezures until you have seen by Primary MD or a Neurologist and advised to do so again. [place]  Controlled Substance Reporting System database was reviewed. Do not drive, operate heavy machinery, perform activities at heights, swim, participate in water activities or provide baby-sitting services while on medications  for pain, sleep and mood until your outpatient physician has reevaluated you and advised to do so again.  You are strongly recommended to comply with the dose, frequency and duration of prescribed medications. Activity: As tolerated with Full fall precautions use walker/cane & assistance as needed Avoid using any recreational substances like cigarette, tobacco, alcohol , or non-prescribed drug. If you experience worsening of your admission symptoms, develop shortness of breath, life threatening emergency, suicidal or homicidal thoughts you must seek medical attention immediately by calling 911 or calling your MD immediately  if symptoms less severe. You must read complete instructions/literature along with all the possible adverse reactions/side effects for all the medicines you take and that have been prescribed to you. Take any new medicine only after you have completely understood and accepted all the possible adverse reactions/side effects.  Wear Seat belts while driving. You were cared for by a hospitalist during your hospital stay. If you have any questions about your discharge medications or the care you received while you were in the hospital after you are discharged, you can call the unit and ask to speak with the hospitalist or the covering physician. Once you are discharged, your primary care physician will handle any further medical issues. Please note that NO REFILLS for any discharge medications will be authorized once you are discharged, as it is imperative that you return to your primary care physician (or establish a relationship with a primary care physician if you do not have one).   Increase activity slowly   Complete by: As directed        Discharge Medications:   Allergies as of 03/26/2024       Reactions   Sulfa  Antibiotics Itching   Niacin  And Related Other (See Comments)   Must take Flush-free   Codeine Nausea Only   Macrobid  [nitrofurantoin ] Nausea Only   Severe  nausea   Prednisone Itching, Rash   Rocephin  [ceftriaxone ] Itching   Has taken cefepime  without issue   Sulfonamide Derivatives Itching   Tetracycline Itching, Rash        Medication List     PAUSE taking these medications    nitrofurantoin  50 MG capsule Wait to take this until your doctor or other care provider tells you to start again. Commonly known as: MACRODANTIN  Take 1 capsule (50 mg total) by mouth daily for UTI prevention.  STOP taking these medications    cefadroxil  500 MG capsule Commonly known as: DURICEF   Lantus  SoloStar 100 UNIT/ML Solostar Pen Generic drug: insulin  glargine       TAKE these medications    Accu-Chek Guide Test test strip Generic drug: glucose blood Use strips as directed to check blood sugar twice daily   Accu-Chek Guide Test test strip Generic drug: glucose blood Use to monitor Blood Sugars daily.   Accu-Chek Guide w/Device Kit Use to monitor Blood Sugars daily.   Accu-Chek Softclix Lancets lancets Use to check blood sugar 2 (two) times daily.   Accu-Chek Softclix Lancets lancets Use to monitor Blood Sugars daily.   acetaminophen  325 MG tablet Commonly known as: TYLENOL  Take 650 mg by mouth every 6 (six) hours as needed for moderate pain.   amLODipine  10 MG tablet Commonly known as: NORVASC  Take 1 tablet (10 mg total) by mouth in the morning for high blood pressure. What changed: Another medication with the same name was removed. Continue taking this medication, and follow the directions you see here.   apixaban  2.5 MG Tabs tablet Commonly known as: ELIQUIS  Take 1 tablet (2.5 mg total) by mouth 2 (two) times daily.   atorvastatin  80 MG tablet Commonly known as: LIPITOR  Take 1 tablet (80 mg total) by mouth at bedtime.   Cinnamon 500 MG capsule Take 2 capsules twice a day by oral route as directed.   ciprofloxacin  500 MG tablet Commonly known as: CIPRO  Take 1 tablet (500 mg total) by mouth 2 (two) times  daily for 5 days.   clopidogrel  75 MG tablet Commonly known as: PLAVIX  Take 1 tablet (75 mg total) by mouth in the morning to prevent blood clots.   dicyclomine  10 MG capsule Commonly known as: BENTYL  Take 1 capsule (10 mg) by mouth every 8 hours (up to 3 times per day) for abdominal pain/spasms as needed.   ezetimibe  10 MG tablet Commonly known as: ZETIA  Take 1 tablet (10 mg total) by mouth daily for cholesterol.   famotidine  20 MG tablet Commonly known as: PEPCID  Take 1 tablet (20 mg total) by mouth daily as needed for severe indigestion and heartburn   Fish Oil 1000 MG Caps Take 1,000 mg by mouth in the morning and at bedtime.   gabapentin  300 MG capsule Commonly known as: NEURONTIN  Take 1 capsule by oral route every 8 hours (up to 3 times daily) for nerve/chronic pain.   insulin  aspart 100 UNIT/ML FlexPen Commonly known as: NOVOLOG  Before each meal 3 times a day, 140-199 - 2 units, 200-250 - 4 units, 251-299 - 6 units,  300-349 - 8 units,  350 or above 10 units.   Insupen Pen Needles 32G X 4 MM Misc Generic drug: Insulin  Pen Needle Use as directed to administer insulin  daily.   metoprolol  tartrate 100 MG tablet Commonly known as: LOPRESSOR  Take 1 tablet (100 mg total) by mouth 2 (two) times daily.   metroNIDAZOLE  500 MG tablet Commonly known as: FLAGYL  Take 1 tablet (500 mg total) by mouth every 12 (twelve) hours for 5 days.   montelukast  10 MG tablet Commonly known as: SINGULAIR  Take 1 tablet (10 mg total) by mouth at bedtime for allergies.   nitroGLYCERIN  0.4 MG SL tablet Commonly known as: NITROSTAT  Dissolve 1 tablet under your tongue at the first sign of heart attack/chest pain; no more than 3 tablets within a 15 minute period.   ondansetron  4 MG disintegrating tablet Commonly known as: ZOFRAN -ODT Take 4 mg  by mouth every 8 (eight) hours as needed for nausea or vomiting.   pantoprazole  40 MG tablet Commonly known as: PROTONIX  Take 1 tablet (40 mg total)  by mouth 2 (two) times daily for indigestion and heart burn.   tiZANidine  4 MG tablet Commonly known as: ZANAFLEX  Take 1 tablet by mouth twice daily as needed for chronic shoulder/neck pain.   venlafaxine  XR 37.5 MG 24 hr capsule Commonly known as: EFFEXOR -XR Take 1 capsule (37.5 mg total) by mouth daily for anxiety and depression.   Vitamin C  500 MG Caps Take 500 mg by mouth in the morning and at bedtime.         The results of significant diagnostics from this hospitalization (including imaging, microbiology, ancillary and laboratory) are listed below for reference.    Procedures and Diagnostic Studies:   CT CHEST ABDOMEN PELVIS W CONTRAST Result Date: 03/23/2024 CLINICAL DATA:  Mid abdominal pain for the last 2-3 days. Reduced urinary output. Recent hospitalization for sepsis related to urinary tract infection. EXAM: CT CHEST, ABDOMEN, AND PELVIS WITH CONTRAST TECHNIQUE: Multidetector CT imaging of the chest, abdomen and pelvis was performed following the standard protocol during bolus administration of intravenous contrast. RADIATION DOSE REDUCTION: This exam was performed according to the departmental dose-optimization program which includes automated exposure control, adjustment of the mA and/or kV according to patient size and/or use of iterative reconstruction technique. CONTRAST:  75mL OMNIPAQUE  IOHEXOL  350 MG/ML SOLN COMPARISON:  02/12/2024 FINDINGS: CT CHEST FINDINGS Cardiovascular: Coronary, aortic arch, and branch vessel atherosclerotic vascular disease. Aortic valve calcification. Mediastinum/Nodes: Unremarkable Lungs/Pleura: Left apical pleuroparenchymal scarring and nodularity with increased internal lucency in this nodularity compared to 11/23/2022, and overall stable from 02/12/2024. Adjacent fiducial noted. Correlate with prior biopsy/workup. 1.4 by 0.8 cm lachrymiform subpleural nodule medially in the right middle lobe on image 114 series 3, stable from 11/23/2022. This is  likely benign but may merit surveillance. 0.4 cm right lower lobe nodule on image 96 series 3, no change from 11/23/2022, considered benign. Musculoskeletal: Degenerative glenohumeral arthropathy bilaterally. Cervical spine hardware noted. CT ABDOMEN PELVIS FINDINGS Hepatobiliary: Cholecystectomy.  Otherwise unremarkable. Pancreas: Unremarkable Spleen: Unremarkable Adrenals/Urinary Tract: Urinary bladder wall thickening may be related to nondistention but correlate with urine analysis. Stomach/Bowel: Loops small bowel the pelvis demonstrate bowel wall thickening and surrounding inflammatory stranding along with a small amount of ascites. Loops proximal to these thick-walled bowel loops are mildly dilated up to about 3.3 cm. Do not see a definite lead point for obstruction, transition from the thick-walled small bowel to nondistended and non thickened distal loops is fairly gradual. Appearance favors enteritis potentially with some local ileus over obstruction although surveillance is likely indicated particularly given the degree of mesenteric edema the mild ascites. Mild sigmoid colon diverticulosis. Vascular/Lymphatic: Atherosclerosis is present, including aortoiliac atherosclerotic disease. Atheromatous plaque dorsally at the origins of the celiac trunk and SMA without critical stenosis or overt occlusion. Reproductive: Uterus absent. Other: Trace perisplenic ascites. Musculoskeletal: Right distal iliopsoas lipoma. Grade 1 anterolisthesis at L3-4 with left paracentral and foraminal disc protrusion causing moderate left foraminal stenosis. IMPRESSION: 1. Loops of small bowel in the pelvis demonstrate bowel wall thickening and surrounding inflammatory stranding along with a small amount of ascites. Loops proximal to these thick-walled bowel loops are mildly dilated up to about 3.3 cm. I do not see a definite lead point for obstruction. Appearance favors enteritis potentially with some local ileus over obstruction  although surveillance is likely indicated particularly given the degree of mesenteric edema  the mild ascites. 2. Urinary bladder wall thickening may be related to nondistention but correlate with urine analysis. 3. Left apical pleuroparenchymal scarring and nodularity with increased internal lucency in this nodularity compared to 11/23/2022, and overall stable from 02/12/2024. Adjacent fiducial noted. Correlate with prior biopsy/workup. 4. 1.4 by 0.8 cm lachrymiform subpleural nodule medially in the right middle lobe, stable from 11/23/2022. This is likely benign but may merit surveillance. 5. Grade 1 anterolisthesis at L3-4 with left paracentral and foraminal disc protrusion causing moderate left foraminal stenosis. 6.  Aortic Atherosclerosis (ICD10-I70.0). Electronically Signed   By: Ryan Salvage M.D.   On: 03/23/2024 11:19     Labs:   Basic Metabolic Panel: Recent Labs  Lab 03/23/24 0950 03/24/24 0439 03/25/24 0650 03/26/24 0430  NA 137 135 137 134*  K 4.5 4.4 4.2 4.6  CL 98 98 98 94*  CO2 28 26 30 31   GLUCOSE 210* 201* 183* 149*  BUN 21 26* 17 18  CREATININE 0.92 0.94 0.52 0.78  CALCIUM  9.6 8.5* 8.6* 8.9  MG  --   --   --  1.9   GFR Estimated Creatinine Clearance: 37.6 mL/min (by C-G formula based on SCr of 0.78 mg/dL). Liver Function Tests: Recent Labs  Lab 03/23/24 0950  AST 19  ALT 26  ALKPHOS 91  BILITOT 0.7  PROT 7.4  ALBUMIN 4.2   Recent Labs  Lab 03/23/24 0950  LIPASE 23   No results for input(s): AMMONIA in the last 168 hours. Coagulation profile No results for input(s): INR, PROTIME in the last 168 hours.  CBC: Recent Labs  Lab 03/23/24 0950 03/24/24 0439 03/25/24 0650 03/26/24 0430  WBC 16.6* 12.8* 9.5 9.1  NEUTROABS 12.9*  --   --  5.3  HGB 14.7 14.2 13.0 13.0  HCT 44.9 46.5* 42.9 42.2  MCV 94.5 97.9 99.5 98.6  PLT 469* 383 337 349   Cardiac Enzymes: No results for input(s): CKTOTAL, CKMB, CKMBINDEX, TROPONINI in the last 168  hours. BNP: Invalid input(s): POCBNP CBG: Recent Labs  Lab 03/25/24 0730 03/25/24 1157 03/25/24 1624 03/25/24 2027 03/26/24 0804  GLUCAP 168* 153* 170* 196* 149*   D-Dimer No results for input(s): DDIMER in the last 72 hours. Hgb A1c No results for input(s): HGBA1C in the last 72 hours. Lipid Profile No results for input(s): CHOL, HDL, LDLCALC, TRIG, CHOLHDL, LDLDIRECT in the last 72 hours. Thyroid  function studies No results for input(s): TSH, T4TOTAL, T3FREE, THYROIDAB in the last 72 hours.  Invalid input(s): FREET3 Anemia work up No results for input(s): VITAMINB12, FOLATE, FERRITIN, TIBC, IRON, RETICCTPCT in the last 72 hours. Microbiology Recent Results (from the past 240 hours)  Urine Culture     Status: Abnormal   Collection Time: 03/23/24 10:02 AM   Specimen: Urine, Clean Catch  Result Value Ref Range Status   Specimen Description   Final    URINE, CLEAN CATCH Performed at [facility], [address] [place], [place] [address]    Special Requests   Final    NONE Performed at [facility], [address] [place], [place] [address]    Culture >=100,000 COLONIES/mL PSEUDOMONAS AERUGINOSA (A)  Final   Report Status 03/25/2024 FINAL  Final   Organism ID, Bacteria PSEUDOMONAS AERUGINOSA (A)  Final      Susceptibility   Pseudomonas aeruginosa - MIC*    CEFTAZIDIME 4 SENSITIVE Sensitive     CIPROFLOXACIN  <=0.25 SENSITIVE Sensitive     GENTAMICIN <=1 SENSITIVE Sensitive  IMIPENEM 1 SENSITIVE Sensitive     PIP/TAZO 8 SENSITIVE Sensitive ug/mL    CEFEPIME  2 SENSITIVE Sensitive     * >=100,000 COLONIES/mL PSEUDOMONAS AERUGINOSA  Culture, blood (Routine X 2) w Reflex to ID Panel     Status: None (Preliminary result)   Collection Time: 03/23/24  5:55 PM   Specimen: BLOOD LEFT HAND  Result Value Ref Range Status   Specimen Description   Final    BLOOD LEFT HAND Performed at [facility], [address] [place], [place] [address]    Special Requests   Final    BOTTLES DRAWN AEROBIC ONLY Blood Culture adequate volume Performed at [facility], [address], [place], [place] [address]    Culture   Final    NO GROWTH 2 DAYS Performed at [facility], [address], [place], [place] [address]    Report Status PENDING  Incomplete  Culture, blood (Routine X 2) w Reflex to ID Panel     Status: None (Preliminary result)   Collection Time: 03/23/24  7:11 PM   Specimen: BLOOD  Result Value Ref Range Status   Specimen Description   Final    BLOOD BLOOD RIGHT HAND Performed at [facility], [address], [place], [place] [address]    Special Requests   Final    BOTTLES DRAWN AEROBIC ONLY Blood Culture adequate volume   Culture   Final    NO GROWTH 2 DAYS Performed at [facility], [address], [place], [place] [address]    Report Status PENDING  Incomplete    Time coordinating discharge: 45 minutes  Signed: Cree Kunert  [facility] 03/26/2024, 10:22 AM

## 2024-03-26 NOTE — Progress Notes (Signed)
 Discharge medications delivered in secure bag to patient at bedside [facility]

## 2024-03-27 NOTE — Telephone Encounter (Addendum)
   Name:  [name] [name]  DOB:  May 21, 1941  MRN:  [id number]   Primary Cardiologist: Ozell Fell, MD  Chart reviewed as part of pre-operative protocol coverage. Patient was contacted 03/27/2024 in reference to pre-operative risk assessment for pending surgery as outlined below.  [name] was hospitalized on March 24, 2024 and discharged on 03/26/2024 with UTI and GI enteronitis.  We need a to see PCP on follow up first and then have them okay her to move forward with surgery once antibiotics are finished. Due to new or worsening symptoms, [name] will require a follow-up visit for further pre-operative risk assessment.    Lamarr Satterfield, NP 03/27/2024, 12:22 PM     Mrs. Domagalski was hospitalized on March 24, 2024 and discharged on 03/26/2024 with UTI and GI enteronitis.  We need a to see PCP on follow up first and then have them okay her to move forward with surgery once antibiotics are finished.

## 2024-03-27 NOTE — Telephone Encounter (Addendum)
 Patient hospitalized on March 24, 2024 and discharged on 03/26/2024 with UTI and GI enteronitis.  We need a to see PCP on follow up first and then have them okay her to move forward with surgery once antibiotics are finished. I will send the note to [facility] to inform,

## 2024-03-30 LAB — CULTURE, BLOOD (ROUTINE X 2)
Culture: NO GROWTH
Culture: NO GROWTH
Special Requests: ADEQUATE
Special Requests: ADEQUATE

## 2024-04-02 ENCOUNTER — Telehealth: Payer: Self-pay

## 2024-04-02 ENCOUNTER — Other Ambulatory Visit (HOSPITAL_COMMUNITY): Payer: Self-pay

## 2024-04-02 ENCOUNTER — Telehealth: Payer: Self-pay | Admitting: Cardiovascular Disease

## 2024-04-02 NOTE — Telephone Encounter (Addendum)
 Pharmacy Patient Advocate Encounter  Insurance verification completed.   The patient is insured through [employer] test claim for ELIQUIS . Currently a quantity of 60 is a 30 day supply and the co-pay is $197.02 .  This test claim was processed through [facility]- copay amounts may vary at other pharmacies due to pharmacy/plan contracts, or as the patient moves through the different stages of their insurance plan.    DEDUCTIBLE TO MEET, COPAY OF $47 ONCE DEDUCTIBLE HAS BEEN MET. WILL HAVE TO PAY INTO DEDUCTIBLE TO QUALIFY FOR PAP.

## 2024-04-02 NOTE — Telephone Encounter (Signed)
 Patient called back. Informed her of Kayla's information. Patient stated she would like to try Coumadin . Will send message to Dr. Wonda and PharmD staff.

## 2024-04-02 NOTE — Telephone Encounter (Signed)
 Pharmacy Patient Advocate Encounter  Insurance verification completed.   The patient is insured through [employer] claim for [employer]. Currently a quantity of 60 is a 30 day supply and the co-pay is NA, PLAN EXCLUSION. PRODUCT NOT COVERED. .  This test claim was processed through [facility]- copay amounts may vary at other pharmacies due to pharmacy/plan contracts, or as the patient moves through the different stages of their insurance plan.

## 2024-04-02 NOTE — Telephone Encounter (Signed)
 Left message for patient to call back

## 2024-04-02 NOTE — Telephone Encounter (Signed)
 Based on test claims (see separate encounters for details), patient has a high deductible Medicare plan. Xarelto is same tier on plan as Eliquis  so it's the same cost and Pradaxa is non formulary. Unfortunately, all the PAP options for DOAC's require patient to meet a 3-4% out of pocket spending on drugs for 2025 based on their annual income. Patient won't meet any PAP requirements until they have paid some towards their deductible. Cost is high due to deductible, but billing details show copay will be $47 once patient meets their deductible. Patient would likely benefit from setting up a Medicare payment plan with their insurance. Patient can contact the customer service number on the back of their insurance card and request to setup a payment plan where the deductible would be split up into payments over time.

## 2024-04-02 NOTE — Telephone Encounter (Signed)
 Pharmacy Patient Advocate Encounter  Insurance verification completed.   The patient is insured through [employer] claim for XARELTO. Currently a quantity of 30 is a 30 day supply and the co-pay is $197.02  This test claim was processed through [facility]- copay amounts may vary at other pharmacies due to pharmacy/plan contracts, or as the patient moves through the different stages of their insurance plan.    DEDUCTIBLE TO MEET, COPAY OF $47 ONCE DEDUCTIBLE HAS BEEN MET.

## 2024-04-02 NOTE — Telephone Encounter (Signed)
 Patient called stating she can't afford Eliquis  and wants to know if there is assistance for it or something else she can be put on.

## 2024-04-04 ENCOUNTER — Encounter: Payer: Self-pay | Admitting: Cardiovascular Disease

## 2024-04-06 ENCOUNTER — Other Ambulatory Visit (HOSPITAL_COMMUNITY): Payer: Self-pay

## 2024-04-06 ENCOUNTER — Other Ambulatory Visit: Payer: Self-pay

## 2024-04-06 MED ORDER — WARFARIN SODIUM 2.5 MG PO TABS
2.5000 mg | ORAL_TABLET | Freq: Every day | ORAL | 3 refills | Status: DC
  Filled 2024-04-06: qty 90, 90d supply, fill #0
  Filled 2024-06-01 (×2): qty 90, 90d supply, fill #1

## 2024-04-06 NOTE — Telephone Encounter (Signed)
 I spoke to patient and educated her on transition from Eliquis  to Warfarin, including monitoring requirements.  She verbalized understanding and will start 04/06/24.  I scheduled her a New Coumadin  appt 8/12 @ 2:30.

## 2024-04-06 NOTE — Telephone Encounter (Signed)
 OK with me to transition to warfarin. thanks

## 2024-04-14 ENCOUNTER — Ambulatory Visit: Attending: Cardiology

## 2024-04-14 ENCOUNTER — Other Ambulatory Visit: Payer: Self-pay

## 2024-04-14 DIAGNOSIS — Z7901 Long term (current) use of anticoagulants: Secondary | ICD-10-CM | POA: Diagnosis not present

## 2024-04-14 DIAGNOSIS — I4891 Unspecified atrial fibrillation: Secondary | ICD-10-CM | POA: Insufficient documentation

## 2024-04-14 DIAGNOSIS — I480 Paroxysmal atrial fibrillation: Secondary | ICD-10-CM

## 2024-04-14 LAB — POCT INR: INR: 1.2 — AB (ref 2.0–3.0)

## 2024-04-14 NOTE — Patient Instructions (Signed)
 Take 2 tablets today only then Increase to 1 tablet daily, except 1.5 tablets every Monday and Friday.  INR in 1 week.  [phone]  A full discussion of the nature of anticoagulants has been carried out.  A benefit risk analysis has been presented to the patient, so that they understand the justification for choosing anticoagulation at this time. The need for frequent and regular monitoring, precise dosage adjustment and compliance is stressed.  Side effects of potential bleeding are discussed.  The patient should avoid any OTC items containing aspirin  or ibuprofen, and should avoid great swings in general diet.  Avoid alcohol  consumption.  Call if any signs of abnormal bleeding.

## 2024-04-14 NOTE — Progress Notes (Signed)
 INR 1.2; Please see anticoagulation encounter

## 2024-04-21 ENCOUNTER — Ambulatory Visit: Attending: Cardiovascular Disease

## 2024-04-21 DIAGNOSIS — Z7901 Long term (current) use of anticoagulants: Secondary | ICD-10-CM | POA: Diagnosis not present

## 2024-04-21 DIAGNOSIS — I480 Paroxysmal atrial fibrillation: Secondary | ICD-10-CM | POA: Diagnosis not present

## 2024-04-21 LAB — POCT INR: INR: 1.3 — AB (ref 2.0–3.0)

## 2024-04-21 NOTE — Progress Notes (Signed)
 INR 1.3 Please see anticoagulation encounter Take 2 tablets today only then Increase to 1.5 tablets daily, except 1 tablet every Tuesday and Thursday. INR in 1 week.  [phone]

## 2024-04-21 NOTE — Patient Instructions (Signed)
 Take 2 tablets today only then Increase to 1.5 tablets daily, except 1 tablet every Tuesday and Thursday. INR in 1 week.  [phone]

## 2024-04-28 ENCOUNTER — Other Ambulatory Visit (HOSPITAL_COMMUNITY): Payer: Self-pay

## 2024-04-28 MED ORDER — INSULIN GLARGINE 100 UNIT/ML SOLOSTAR PEN
10.0000 [IU] | PEN_INJECTOR | Freq: Every day | SUBCUTANEOUS | 3 refills | Status: DC
  Filled 2024-04-28: qty 9, 90d supply, fill #0

## 2024-04-30 ENCOUNTER — Ambulatory Visit: Attending: Cardiovascular Disease | Admitting: *Deleted

## 2024-04-30 DIAGNOSIS — Z5181 Encounter for therapeutic drug level monitoring: Secondary | ICD-10-CM

## 2024-04-30 DIAGNOSIS — I480 Paroxysmal atrial fibrillation: Secondary | ICD-10-CM

## 2024-04-30 LAB — POCT INR: POC INR: 1.5

## 2024-04-30 NOTE — Progress Notes (Signed)
 INR 1.5; Please see anticoagulation encounter  Lab Results  Component Value Date   INR 1.5 04/30/2024   INR 1.3 (A) 04/21/2024   INR 1.2 (A) 04/14/2024    Description   Take 2 tablets of warfarin today and then START taking warfarin 1.5 tablets daily. Recheck INR in 1 week. Coumadin  Clinic [phone]

## 2024-04-30 NOTE — Patient Instructions (Signed)
 Description   Take 2 tablets of warfarin today and then START taking warfarin 1.5 tablets daily. Recheck INR in 1 week. Coumadin  Clinic [phone]

## 2024-05-08 ENCOUNTER — Other Ambulatory Visit (HOSPITAL_COMMUNITY): Payer: Self-pay

## 2024-05-08 MED ORDER — CEFADROXIL 500 MG PO CAPS
500.0000 mg | ORAL_CAPSULE | Freq: Every day | ORAL | 0 refills | Status: AC
  Filled 2024-05-08: qty 7, 7d supply, fill #0

## 2024-05-11 ENCOUNTER — Ambulatory Visit: Attending: Cardiovascular Disease

## 2024-05-11 DIAGNOSIS — I480 Paroxysmal atrial fibrillation: Secondary | ICD-10-CM | POA: Diagnosis not present

## 2024-05-11 DIAGNOSIS — Z7901 Long term (current) use of anticoagulants: Secondary | ICD-10-CM | POA: Diagnosis not present

## 2024-05-11 LAB — POCT INR: INR: 1.5 — AB (ref 2.0–3.0)

## 2024-05-11 NOTE — Progress Notes (Signed)
 INR 1.5 Please see anticoagulation encounter START taking warfarin 1.5 tablets daily, except 2 tablets every Monday, Wednesday and Friday. Recheck INR in 2 weeks. Coumadin  Clinic [phone]

## 2024-05-11 NOTE — Patient Instructions (Signed)
 START taking warfarin 1.5 tablets daily, except 2 tablets every Monday, Wednesday and Friday. Recheck INR in 2 weeks. Coumadin  Clinic [phone]

## 2024-05-27 ENCOUNTER — Encounter: Payer: Self-pay | Admitting: *Deleted

## 2024-05-29 ENCOUNTER — Ambulatory Visit: Attending: Cardiovascular Disease | Admitting: Cardiovascular Disease

## 2024-05-29 ENCOUNTER — Ambulatory Visit: Attending: Cardiovascular Disease | Admitting: *Deleted

## 2024-05-29 ENCOUNTER — Telehealth: Payer: Self-pay | Admitting: Cardiovascular Disease

## 2024-05-29 ENCOUNTER — Encounter: Payer: Self-pay | Admitting: Cardiovascular Disease

## 2024-05-29 ENCOUNTER — Other Ambulatory Visit (HOSPITAL_BASED_OUTPATIENT_CLINIC_OR_DEPARTMENT_OTHER): Payer: Self-pay

## 2024-05-29 VITALS — BP 120/60 | HR 72 | Ht 60.0 in | Wt 119.4 lb

## 2024-05-29 DIAGNOSIS — I480 Paroxysmal atrial fibrillation: Secondary | ICD-10-CM

## 2024-05-29 DIAGNOSIS — I10 Essential (primary) hypertension: Secondary | ICD-10-CM

## 2024-05-29 DIAGNOSIS — E785 Hyperlipidemia, unspecified: Secondary | ICD-10-CM | POA: Diagnosis not present

## 2024-05-29 DIAGNOSIS — I25119 Atherosclerotic heart disease of native coronary artery with unspecified angina pectoris: Secondary | ICD-10-CM | POA: Diagnosis not present

## 2024-05-29 DIAGNOSIS — Z7901 Long term (current) use of anticoagulants: Secondary | ICD-10-CM

## 2024-05-29 LAB — POCT INR: INR: 1.5 — AB (ref 2.0–3.0)

## 2024-05-29 MED ORDER — LOSARTAN POTASSIUM 50 MG PO TABS: 50.0000 mg | ORAL_TABLET | Freq: Every day | ORAL | 3 refills | Status: DC

## 2024-05-29 MED ORDER — LOSARTAN POTASSIUM 50 MG PO TABS
50.0000 mg | ORAL_TABLET | Freq: Every day | ORAL | 11 refills | Status: DC
  Filled 2024-05-29 – 2024-06-01 (×3): qty 30, 30d supply, fill #0
  Filled 2024-06-24: qty 30, 30d supply, fill #1

## 2024-05-29 NOTE — Progress Notes (Signed)
 Cardiology Office Note:    Date:  05/29/2024   ID:  [name] [name], DOB February 04, 1941, MRN [id number]  PCP:  Arloa Jarvis, NP   [facility] Providers Cardiologist:  Ozell Fell, MD Cardiology APP:  Lelon Glendia ONEIDA DEVONNA     Referring MD: Arloa Jarvis, NP   Chief Complaint  Patient presents with   Leg Swelling    History of Present Illness:    [name] [name] is a 83 y.o. female with a hx of:  Coronary artery disease  S/p Inf MI in 2007 >> PCI: DES to RCA S/p POBA to [facility] and DES to dRCA in 07/2017 ([facility] in [place], [place]) Mid LAD mod dz >> neg by FFR Myoview  11/2019: no ischemia  Cath 03/09/20: pRCA 95 >> PCI: 2.75 x 18 mm DES  S/p 3 x 18 mm DES to pLAD in 06/2021 [facility] 06/28/21: pLAD 70 (RFR 0.88) >> PCI; mLAD 30; pLCx 30; mRCA stents patent Pharm MPI 11/15/23: no ischemia, EF 71, low risk  Paroxysmal atrial fibrillation  Admitted 02/2024 Bacterial endocarditis  Admitted to [facility] in [place], [place] Rx with IV antibiotics x 3 weeks Echocardiogram 10/2018: EF 55-60, AoV calcification (?Lambl's Excrescence) 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 TTE 02/13/24: EF 65-70, no RWMA, mild LVH, mild LAE, small pericardial effusion, mild MR< mild AI, AV sclerosis Palpitations: SVT/PACs Monitor 12/2023: occ short SV runs (longest 17.9 sec), PACs 5% Hypertension  Hyperlipidemia  Diabetes mellitus  Carotid US  01/15/22: Bilat ICA 1-39 Lung nodule (CT 02/2024: 1.4 x 1.9 cm medial left lung apex >>Rpt 6-12 mos)   The patient is here with her granddaughter and great grandchildren today.  She was last hospitalized in July with abdominal pain and nausea and found to have small bowel enteritis as well as Pseudomonas UTI.  She had an episode of chest discomfort last week that resolved with 1 sublingual nitroglycerin .  She has not had any other chest pain.  She denies shortness of breath.  She is not has no orthopnea or PND, but she been having a lot of  problems with leg swelling.  She has noticed that this has occurred mostly in the last 3 to 4 months.  This coincides with her being started on amlodipine  and the dose being increased to 10 mg daily.  Current Medications: Current Meds  Medication Sig   Accu-Chek Softclix Lancets lancets Use to check blood sugar 2 (two) times daily.   Accu-Chek Softclix Lancets lancets Use to monitor Blood Sugars daily.   acetaminophen  (TYLENOL ) 325 MG tablet Take 650 mg by mouth every 6 (six) hours as needed for moderate pain.   amLODipine  (NORVASC ) 10 MG tablet Take 1 tablet (10 mg total) by mouth in the morning for high blood pressure.   Ascorbic Acid  (VITAMIN C ) 500 MG CAPS Take 500 mg by mouth in the morning and at bedtime.   atorvastatin  (LIPITOR ) 80 MG tablet Take 1 tablet (80 mg total) by mouth at bedtime.   Blood Glucose Monitoring Suppl (ACCU-CHEK GUIDE) w/Device KIT Use to monitor Blood Sugars daily.   Cinnamon 500 MG capsule Take 2 capsules twice a day by oral route as directed.   clopidogrel  (PLAVIX ) 75 MG tablet Take 1 tablet (75 mg total) by mouth in the morning to prevent blood clots.   dicyclomine  (BENTYL ) 10 MG capsule Take 1 capsule (10 mg) by mouth every 8 hours (up to 3 times per day) for abdominal pain/spasms as needed.   ezetimibe  (ZETIA )  10 MG tablet Take 1 tablet (10 mg total) by mouth daily for cholesterol.   famotidine  (PEPCID ) 20 MG tablet Take 1 tablet (20 mg total) by mouth daily as needed for severe indigestion and heartburn   gabapentin  (NEURONTIN ) 300 MG capsule Take 1 capsule by oral route every 8 hours (up to 3 times daily) for nerve/chronic pain.   glucose blood (ACCU-CHEK GUIDE TEST) test strip Use to monitor Blood Sugars daily.   glucose blood (ASSURE PRISM  MULTI TEST) test strip Use strips as directed to check blood sugar twice daily   insulin  aspart (NOVOLOG ) 100 UNIT/ML FlexPen Before each meal 3 times a day, 140-199 - 2 units, 200-250 - 4 units, 251-299 - 6 units,  300-349  - 8 units,  350 or above 10 units.   insulin  glargine (LANTUS  SOLOSTAR) 100 UNIT/ML Solostar Pen Inject 10 Units into the skin daily for diabetes.   Insulin  Pen Needle (PEN NEEDLES) 32G X 4 MM MISC Use as directed to administer insulin  daily.   metoprolol  tartrate (LOPRESSOR ) 100 MG tablet Take 1 tablet (100 mg total) by mouth 2 (two) times daily. (Patient taking differently: Take 100 mg by mouth 2 (two) times daily. Per patient taking 1/2 tablet (50) mg twice daily)   montelukast  (SINGULAIR ) 10 MG tablet Take 1 tablet (10 mg total) by mouth at bedtime for allergies.   [Paused] nitrofurantoin  (MACRODANTIN ) 50 MG capsule Take 1 capsule (50 mg total) by mouth daily for UTI prevention.   nitroGLYCERIN  (NITROSTAT ) 0.4 MG SL tablet Dissolve 1 tablet under your tongue at the first sign of heart attack/chest pain; no more than 3 tablets within a 15 minute period.   Omega-3 Fatty Acids (FISH OIL) 1000 MG CAPS Take 1,000 mg by mouth in the morning and at bedtime.   ondansetron  (ZOFRAN -ODT) 4 MG disintegrating tablet Take 4 mg by mouth every 8 (eight) hours as needed for nausea or vomiting.   pantoprazole  (PROTONIX ) 40 MG tablet Take 1 tablet (40 mg total) by mouth 2 (two) times daily for indigestion and heart burn.   tiZANidine  (ZANAFLEX ) 4 MG tablet Take 1 tablet by mouth twice daily as needed for chronic shoulder/neck pain.   venlafaxine  XR (EFFEXOR -XR) 37.5 MG 24 hr capsule Take 1 capsule (37.5 mg total) by mouth daily for anxiety and depression.   warfarin (COUMADIN ) 2.5 MG tablet Take 1 tablet (2.5 mg total) by mouth daily.     Allergies:   Sulfa  antibiotics, Niacin  and related, Codeine, Macrobid  [nitrofurantoin ], Prednisone, Rocephin  [ceftriaxone ], Sulfonamide derivatives, and Tetracycline   ROS:   Please see the history of present illness.    All other systems reviewed and are negative.  EKGs/Labs/Other Studies Reviewed:    The following studies were reviewed today: Cardiac Studies & Procedures    ______________________________________________________________________________________________ CARDIAC CATHETERIZATION  CARDIAC CATHETERIZATION 06/28/2021  Conclusion   Mid LAD lesion is 30% stenosed.   Prox Cx to Mid Cx lesion is 30% stenosed.   Mid RCA lesion is 10% stenosed.   Prox LAD to Mid LAD lesion is 70% stenosed.   A drug-eluting stent was successfully placed using a STENT ONYX FRONTIER 3.0X18.   Post intervention, there is a 0% residual stenosis.   Patent mid RCA stents  Severe proximal LAD stenosis that angiographically appears moderate to severe. RFR assessment with pressure wire of 0.88. Successful PTCA/DES x 1 proximal LAD (RFR guided) Mild to moderate stenosis in the non-dominant mid Circumflex Large dominant RCA with patent mid stents without restenosis Normal LVEDP  Recommendations: Continue DAPT with ASA/Plavix . Continue beta blocker and statin.  Findings Coronary Findings Diagnostic  Dominance: Right  Left Anterior Descending Vessel is large. Prox LAD to Mid LAD lesion is 70% stenosed. Mid LAD lesion is 30% stenosed.  Left Circumflex Prox Cx to Mid Cx lesion is 30% stenosed.  Right Coronary Artery Non-stenotic Prox RCA to Mid RCA lesion was previously treated. Mid RCA lesion is 10% stenosed. The lesion was previously treated using a stent (unknown type) over 2 years ago.  Intervention  Prox LAD to Mid LAD lesion Stent C[place]IST[place] GUIDE 6FR XBLAD3.5 guide catheter was inserted. Lesion crossed with guidewire. Pre-stent angioplasty was performed using a BALLN SAPPHIRE 2.5X12. A drug-eluting stent was successfully placed using a STENT ONYX FRONTIER 3.0X18. Stent strut is well apposed. Post-stent angioplasty was performed using a BALLN SAPPHIRE [place] 3.25X12. Post-Intervention Lesion Assessment The intervention was successful. Pre-interventional TIMI flow is 3. Post-intervention TIMI flow is 3. No complications occurred at this lesion. There is a 0% residual  stenosis post intervention.   CARDIAC CATHETERIZATION  CARDIAC CATHETERIZATION 03/09/2020  Conclusion  Mid RCA lesion is 10% stenosed.  Prox RCA to Mid RCA lesion is 95% stenosed.  A drug-eluting stent was successfully placed using a STENT RESOLUTE ONYX [id number].  Post intervention, there is a 0% residual stenosis.  Prox Cx to Mid Cx lesion is 30% stenosed.  Prox LAD to Mid LAD lesion is 50% stenosed.  Mid LAD lesion is 30% stenosed.  1. Large, dominant RCA with patent mid stents. Severe stenosis just prior to the stented segment in the mid vessel. 2. Moderate non-obstructive proximal LAD stenosis 3. Successful PTCA/DES x 1 mid RCA  Recommendations: Continue DAPT with ASA and Plavix . Continue statin and beta blocker.  Findings Coronary Findings Diagnostic  Dominance: Right  Left Anterior Descending Vessel is large. Prox LAD to Mid LAD lesion is 50% stenosed. Mid LAD lesion is 30% stenosed.  Left Circumflex Prox Cx to Mid Cx lesion is 30% stenosed.  Right Coronary Artery Prox RCA to Mid RCA lesion is 95% stenosed. Mid RCA lesion is 10% stenosed. The lesion was previously treated using a stent (unknown type) over 2 years ago.  Intervention  Prox RCA to Mid RCA lesion Stent Pre-stent angioplasty was performed using a BALLOON SAPPHIRE 2.0X12. A drug-eluting stent was successfully placed using a STENT RESOLUTE ONYX [id number]. Stent strut is well apposed. Post-stent angioplasty was performed using a BALLOON SAPPHIRE [place] 3.0X12. Post-Intervention Lesion Assessment The intervention was successful. Pre-interventional TIMI flow is 3. Post-intervention TIMI flow is 3. No complications occurred at this lesion. There is a 0% residual stenosis post intervention.   STRESS TESTS  MYOCARDIAL PERFUSION IMAGING 11/15/2023  Interpretation Summary   The study is normal. The study is low risk.   No ST deviation was noted.   LV perfusion is normal. There is no evidence of ischemia.  There is no evidence of infarction.   Left ventricular function is normal. Nuclear stress EF: 71%. The left ventricular ejection fraction is hyperdynamic (>65%). End diastolic cavity size is normal. End systolic cavity size is normal.   Prior study available for comparison from 11/12/2019.   ECHOCARDIOGRAM  ECHOCARDIOGRAM COMPLETE 02/13/2024  Narrative ECHOCARDIOGRAM REPORT    Patient Name:   [name] [name] Date of Exam: 02/13/2024 Medical Rec #:  [id number]       Height:       60.0 in Accession #:    [id number]      Weight:  124.0 lb Date of Birth:  [birth year]       BSA:          1.523 m Patient Age:    83 years        BP:           156/70 mmHg Patient Gender: F               HR:           94 bpm. Exam Location:  Inpatient  Procedure: 2D Echo, Cardiac Doppler and Color Doppler (Both Spectral and Color Flow Doppler were utilized during procedure).  Indications:    Sepsis [facility])  History:        Patient has prior history of Echocardiogram examinations, most recent 01/15/2022. CAD, Stroke; Risk Factors:Hypertension and Diabetes.  Sonographer:    Jayson Gaskins Referring Phys: MARIO GAILS PATEL  IMPRESSIONS   1. Left ventricular ejection fraction, by estimation, is 65 to 70%. The left ventricle has normal function. The left ventricle has no regional wall motion abnormalities. There is mild left ventricular hypertrophy. Left ventricular diastolic parameters were normal. 2. Right ventricular systolic function is normal. The right ventricular size is not well visualized. Tricuspid regurgitation signal is inadequate for assessing PA pressure. 3. Left atrial size was mildly dilated. 4. A small pericardial effusion is present. There is no evidence of cardiac tamponade. 5. The mitral valve is degenerative. Mild mitral valve regurgitation. No evidence of mitral stenosis. 6. The aortic valve was not well visualized. There is mild calcification of the aortic valve. Aortic valve regurgitation is  mild. Aortic valve sclerosis/calcification is present, without any evidence of aortic stenosis. 7. The inferior vena cava is normal in size with greater than 50% respiratory variability, suggesting right atrial pressure of 3 mmHg.  Comparison(s): No significant change from prior study.  Conclusion(s)/Recommendation(s): Otherwise normal echocardiogram, with minor abnormalities described in the report.  FINDINGS Left Ventricle: Left ventricular ejection fraction, by estimation, is 65 to 70%. The left ventricle has normal function. The left ventricle has no regional wall motion abnormalities. The left ventricular internal cavity size was normal in size. There is mild left ventricular hypertrophy. Left ventricular diastolic parameters were normal.  Right Ventricle: The right ventricular size is not well visualized. Right vetricular wall thickness was not well visualized. Right ventricular systolic function is normal. Tricuspid regurgitation signal is inadequate for assessing PA pressure.  Left Atrium: Left atrial size was mildly dilated.  Right Atrium: Right atrial size was normal in size.  Pericardium: A small pericardial effusion is present. There is no evidence of cardiac tamponade.  Mitral Valve: The mitral valve is degenerative in appearance. There is mild thickening of the mitral valve leaflet(s). There is mild calcification of the mitral valve leaflet(s). Mild to moderate mitral annular calcification. Mild mitral valve regurgitation. No evidence of mitral valve stenosis.  Tricuspid Valve: The tricuspid valve is not well visualized. Tricuspid valve regurgitation is trivial. No evidence of tricuspid stenosis.  Aortic Valve: The aortic valve was not well visualized. There is mild calcification of the aortic valve. Aortic valve regurgitation is mild. Aortic valve sclerosis/calcification is present, without any evidence of aortic stenosis. Aortic valve mean gradient measures 8.0 mmHg. Aortic  valve peak gradient measures 11.7 mmHg. Aortic valve area, by VTI measures 1.33 cm.  Pulmonic Valve: The pulmonic valve was not well visualized. Pulmonic valve regurgitation is trivial. No evidence of pulmonic stenosis.  Aorta: The aortic root, ascending aorta and aortic arch are all structurally normal,  with no evidence of dilitation or obstruction.  Venous: The inferior vena cava is normal in size with greater than 50% respiratory variability, suggesting right atrial pressure of 3 mmHg.  IAS/Shunts: The atrial septum is grossly normal.   LEFT VENTRICLE PLAX 2D LVIDd:         3.50 cm   Diastology LVIDs:         2.20 cm   LV e' medial:    5.44 cm/s LV PW:         0.90 cm   LV E/e' medial:  16.9 LV IVS:        0.90 cm   LV e' lateral:   8.49 cm/s LVOT diam:     1.70 cm   LV E/e' lateral: 10.9 LV SV:         46 LV SV Index:   30 LVOT Area:     2.27 cm   RIGHT VENTRICLE RV S prime:     16.40 cm/s TAPSE (M-mode): 2.6 cm  LEFT ATRIUM             Index        RIGHT ATRIUM           Index LA Vol (A2C):   40.0 ml 26.26 ml/m  RA Area:     11.20 cm LA Vol (A4C):   43.7 ml 28.69 ml/m  RA Volume:   23.30 ml  15.29 ml/m LA Biplane Vol: 44.3 ml 29.08 ml/m AORTIC VALVE AV Area (Vmax):    1.34 cm AV Area (Vmean):   1.12 cm AV Area (VTI):     1.33 cm AV Vmax:           171.00 cm/s AV Vmean:          135.000 cm/s AV VTI:            0.344 m AV Peak Grad:      11.7 mmHg AV Mean Grad:      8.0 mmHg LVOT Vmax:         101.00 cm/s LVOT Vmean:        66.700 cm/s LVOT VTI:          0.201 m LVOT/AV VTI ratio: 0.58  AORTA Ao Root diam: 2.50 cm  MITRAL VALVE MV Area (PHT): 5.54 cm    SHUNTS MV Decel Time: 137 msec    Systemic VTI:  0.20 m MV E velocity: 92.20 cm/s  Systemic Diam: 1.70 cm MV A velocity: 99.00 cm/s MV E/A ratio:  0.93  Shelda Bruckner MD Electronically signed by Shelda Bruckner MD Signature Date/Time: 02/13/2024/12:05:36 PM    Final     MONITORS  LONG TERM MONITOR (3-14 DAYS) 12/26/2023  Narrative Patch Wear Time:  12 days and 21 hours (2025-04-01T11:15:20-0400 to 2025-04-14T08:58:14-0400)  Patient had a min HR of 54 bpm, max HR of 203 bpm, and avg HR of 68 bpm. Predominant underlying rhythm was Sinus Rhythm. 54 Supraventricular Tachycardia runs occurred, the run with the fastest interval lasting 5 beats with a max rate of 203 bpm, the longest lasting 17.9 secs with an avg rate of 122 bpm. Isolated SVEs were frequent (5.2%, 61736), SVE Couplets were rare (<1.0%, 1698), and SVE Triplets were rare (<1.0%, 280). Isolated VEs were rare (<1.0%), VE Couplets were rare (<1.0%), and no VE Triplets were present.  Summary: The basic rhythm is normal sinus with an average heart rate of 68 bpm.  There are occasional, short supraventricular runs.  Frequent PACs are present  at a frequency of about 5%.  There are rare ventricular ectopic beats.  There are no sustained arrhythmias.  There is no atrial fibrillation or flutter.  There is no high-grade AV block.       ______________________________________________________________________________________________      EKG:   EKG Interpretation Date/Time:  Friday May 29 2024 14:20:03 EDT Ventricular Rate:  72 PR Interval:  166 QRS Duration:  68 QT Interval:  380 QTC Calculation: 416 R Axis:   4  Text Interpretation: Sinus rhythm with Premature supraventricular complexes Possible Inferior infarct , age undetermined When compared with ECG of 15-Feb-2024 17:36, Premature supraventricular complexes are now Present QRS axis Shifted right Borderline criteria for Inferior infarct are now Present Nonspecific T wave abnormality no longer evident in Lateral leads Confirmed by Wonda Sharper [phone]) on 05/29/2024 2:30:40 PM    Recent Labs: 02/15/2024: TSH 2.863 02/18/2024: B Natriuretic Peptide 79.4 03/23/2024: ALT 26 03/26/2024: BUN 18; Creatinine, Ser 0.78; Hemoglobin 13.0; Magnesium  1.9;  Platelets 349; Potassium 4.6; Sodium 134  Recent Lipid Panel    Component Value Date/Time   CHOL 109 10/04/2021 0921   TRIG 110.0 10/04/2021 0921   TRIG 164 (H) 06/24/2006 0738   HDL 48.00 10/04/2021 0921   CHOLHDL 2 10/04/2021 0921   VLDL 22.0 10/04/2021 0921   LDLCALC 39 10/04/2021 0921   LDLDIRECT 42.0 09/12/2020 1516     Risk Assessment/Calculations:    CHA2DS2-VASc Score = 6   This indicates a 9.7% annual risk of stroke. The patient's score is based upon: CHF History: 0 HTN History: 1 Diabetes History: 1 Stroke History: 0 Vascular Disease History: 1 Age Score: 2 Gender Score: 1               Physical Exam:    VS:  BP 120/60   Pulse 72   Ht 5' (1.524 m)   Wt 119 lb 6.4 oz (54.2 kg)   SpO2 90%   BMI 23.32 kg/m     Wt Readings from Last 3 Encounters:  05/29/24 119 lb 6.4 oz (54.2 kg)  03/23/24 118 lb 6.2 oz (53.7 kg)  03/02/24 117 lb 12.8 oz (53.4 kg)     GEN:  Well nourished, well developed in no acute distress HEENT: Normal NECK: No JVD; No carotid bruits LYMPHATICS: No lymphadenopathy CARDIAC: RRR, no murmurs, rubs, gallops RESPIRATORY:  Clear to auscultation without rales, wheezing or rhonchi  ABDOMEN: Soft, non-tender, non-distended MUSCULOSKELETAL: 2+ bilateral pretibial edema; No deformity  SKIN: Warm and dry NEUROLOGIC:  Alert and oriented x 3 PSYCHIATRIC:  Normal affect   Assessment & Plan Coronary artery disease involving native coronary artery of native heart with angina pectoris Clinically stable.  EKG shows no acute changes and no recurrent chest pain after last week's episode.  Continue current medical regimen which includes clopidogrel  for antiplatelet therapy, metoprolol  for antianginal treatment, and atorvastatin .  She has had multiple ACS presentations and has required multiple stents.  Has tolerated both clopidogrel  and oral anticoagulation without bleeding complications.  Will need to continue to weigh the risks and benefits of  ongoing antiplatelet therapy.  For now we will continue clopidogrel . Paroxysmal atrial fibrillation (HCC) Patient has been initiated on warfarin.  Continue beta-blockade.  Appears to be maintaining sinus rhythm. Essential hypertension Highly suspicious that amlodipine  is causing her leg swelling.  Will stop this and start her on losartan  50 mg daily.  Continue metoprolol  tartrate. Hyperlipidemia with target LDL less than 100 Treated with atorvastatin  and ezetimibe .  Last LDL cholesterol  was 39.      Medication Adjustments/Labs and Tests Ordered: Current medicines are reviewed at length with the patient today.  Concerns regarding medicines are outlined above.  Orders Placed This Encounter  Procedures   EKG 12-Lead   No orders of the defined types were placed in this encounter.   Patient Instructions  Medication Instructions:  STOP Amlodipine    START Losartan  50 mg once daily   *If you need a refill on your cardiac medications before your next appointment, please call your pharmacy*  Lab Work: None ordered today. If you have labs (blood work) drawn today and your tests are completely normal, you will receive your results only by: MyChart Message (if you have MyChart) OR A paper copy in the mail If you have any lab test that is abnormal or we need to change your treatment, we will call you to review the results.  Testing/Procedures: None ordered today.  Follow-Up: At [facility], you and your health needs are our priority.  As part of our continuing mission to provide you with exceptional heart care, our providers are all part of one team.  This team includes your primary Cardiologist (physician) and Advanced Practice Providers or APPs (Physician Assistants and Nurse Practitioners) who all work together to provide you with the care you need, when you need it.  Your next appointment:   3-4 month(s)  Provider:   One of our Advanced Practice Providers (APPs): Morse Clause, PA-C  Lamarr Satterfield, NP Miriam Shams, NP  Olivia Pavy, PA-C Josefa Beauvais, NP  Leontine Salen, PA-C Orren Fabry, PA-C  Hao Meng, PA-C Ernest Dick, NP  Damien Braver, NP Jon Hails, PA-C  Waddell Donath, PA-C    Dayna Dunn, PA-C  Scott Weaver, PA-C Lum Louis, NP Katlyn West, NP Callie Goodrich, PA-C  Xika Zhao, NP Thom Sluder, PA-C    Kathleen Johnson, PA-C     Signed, Ozell Fell, MD  05/29/2024 2:40 PM    [facility]

## 2024-05-29 NOTE — Telephone Encounter (Signed)
 Pt's medication was sent to pt's pharmacy as requested. Confirmation received.

## 2024-05-29 NOTE — Assessment & Plan Note (Signed)
 Highly suspicious that amlodipine  is causing her leg swelling.  Will stop this and start her on losartan  50 mg daily.  Continue metoprolol  tartrate.

## 2024-05-29 NOTE — Progress Notes (Signed)
 Description   INR-1.5; Today take 3 tablets of warfarin then START taking warfarin 2 tablets daily, except 1.5 tablets every Thursdays. Recheck INR in 1 week. Coumadin  Clinic [phone]

## 2024-05-29 NOTE — Assessment & Plan Note (Signed)
 Patient has been initiated on warfarin.  Continue beta-blockade.  Appears to be maintaining sinus rhythm.

## 2024-05-29 NOTE — Patient Instructions (Signed)
 Medication Instructions:  STOP Amlodipine    START Losartan  50 mg once daily   *If you need a refill on your cardiac medications before your next appointment, please call your pharmacy*  Lab Work: None ordered today. If you have labs (blood work) drawn today and your tests are completely normal, you will receive your results only by: MyChart Message (if you have MyChart) OR A paper copy in the mail If you have any lab test that is abnormal or we need to change your treatment, we will call you to review the results.  Testing/Procedures: None ordered today.  Follow-Up: At [facility], you and your health needs are our priority.  As part of our continuing mission to provide you with exceptional heart care, our providers are all part of one team.  This team includes your primary Cardiologist (physician) and Advanced Practice Providers or APPs (Physician Assistants and Nurse Practitioners) who all work together to provide you with the care you need, when you need it.  Your next appointment:   3-4 month(s)  Provider:   One of our Advanced Practice Providers (APPs): Morse Clause, PA-C  Lamarr Satterfield, NP Miriam Shams, NP  Olivia Pavy, PA-C Josefa Beauvais, NP  Leontine Salen, PA-C Orren Fabry, PA-C  Hao Meng, PA-C Ernest Dick, NP  Damien Braver, NP Jon Hails, PA-C  Waddell Donath, PA-C    Dayna Dunn, PA-C  Scott Weaver, PA-C Lum Louis, NP Katlyn West, NP Callie Goodrich, PA-C  Xika Zhao, NP Sheng Haley, PA-C    Kathleen Johnson, PA-C

## 2024-05-29 NOTE — Telephone Encounter (Signed)
*  STAT* If patient is at the pharmacy, call can be transferred to refill team.   1. Which medications need to be refilled? (please list name of each medication and dose if known)  losartan  (COZAAR ) 50 MG tablet  2. Which pharmacy/location (including street and city if local pharmacy) is medication to be sent to? [facility]  3. Do they need a 30 day or 90 day supply?  30 day supply + refills  Patient says her medication was sent to the wrong pharmacy. She would like to have it transferred to [facility] if possible.

## 2024-05-29 NOTE — Patient Instructions (Addendum)
 Description   INR-1.5; Today take 3 tablets of warfarin then START taking warfarin 2 tablets daily, except 1.5 tablets every Thursdays. Recheck INR in 1 week. Coumadin  Clinic [phone]

## 2024-05-29 NOTE — Assessment & Plan Note (Signed)
 Clinically stable.  EKG shows no acute changes and no recurrent chest pain after last week's episode.  Continue current medical regimen which includes clopidogrel  for antiplatelet therapy, metoprolol  for antianginal treatment, and atorvastatin .  She has had multiple ACS presentations and has required multiple stents.  Has tolerated both clopidogrel  and oral anticoagulation without bleeding complications.  Will need to continue to weigh the risks and benefits of ongoing antiplatelet therapy.  For now we will continue clopidogrel .

## 2024-06-01 ENCOUNTER — Other Ambulatory Visit: Payer: Self-pay | Admitting: Cardiovascular Disease

## 2024-06-01 ENCOUNTER — Other Ambulatory Visit (HOSPITAL_COMMUNITY): Payer: Self-pay

## 2024-06-01 ENCOUNTER — Other Ambulatory Visit: Payer: Self-pay

## 2024-06-01 MED ORDER — WARFARIN SODIUM 2.5 MG PO TABS
ORAL_TABLET | ORAL | 3 refills | Status: DC
  Filled 2024-06-01: qty 40, 20d supply, fill #0
  Filled 2024-06-01: qty 60, fill #0
  Filled 2024-06-01: qty 20, 8d supply, fill #0
  Filled 2024-06-24: qty 60, 28d supply, fill #1
  Filled 2024-07-23: qty 60, 32d supply, fill #2
  Filled 2024-07-23: qty 60, 28d supply, fill #2

## 2024-06-01 NOTE — Telephone Encounter (Signed)
 Refill request for warfarin:  Last INR was 1.5 on 05/29/24 Next INR due 06/04/24 LOV was 05/29/24  Refill approved.

## 2024-06-04 ENCOUNTER — Ambulatory Visit: Attending: Cardiovascular Disease | Admitting: *Deleted

## 2024-06-04 DIAGNOSIS — I480 Paroxysmal atrial fibrillation: Secondary | ICD-10-CM

## 2024-06-04 DIAGNOSIS — Z5181 Encounter for therapeutic drug level monitoring: Secondary | ICD-10-CM | POA: Diagnosis not present

## 2024-06-04 LAB — POCT INR: POC INR: 1.6

## 2024-06-04 NOTE — Patient Instructions (Signed)
 Description   INR-1.6, Take 7.5 mg of warfarin today then START taking warfarin 5mg  daily except for 7.5mg  on Mondays and Fridays. Recheck INR in 1 week. Clinic [phone]

## 2024-06-04 NOTE — Progress Notes (Signed)
 Lab Results  Component Value Date   INR 1.6 06/04/2024   INR 1.5 (A) 05/29/2024   INR 1.5 (A) 05/11/2024    Description   INR-1.6, Take 7.5 mg of warfarin today then START taking warfarin 5mg  daily except for 7.5mg  on Mondays and Fridays. Recheck INR in 1 week. Clinic [phone]

## 2024-06-09 ENCOUNTER — Other Ambulatory Visit (HOSPITAL_BASED_OUTPATIENT_CLINIC_OR_DEPARTMENT_OTHER): Payer: Self-pay | Admitting: Nurse Practitioner

## 2024-06-09 DIAGNOSIS — Z780 Asymptomatic menopausal state: Secondary | ICD-10-CM

## 2024-06-12 ENCOUNTER — Ambulatory Visit: Attending: Cardiovascular Disease | Admitting: *Deleted

## 2024-06-12 DIAGNOSIS — I480 Paroxysmal atrial fibrillation: Secondary | ICD-10-CM

## 2024-06-12 DIAGNOSIS — Z7901 Long term (current) use of anticoagulants: Secondary | ICD-10-CM | POA: Diagnosis not present

## 2024-06-12 LAB — POCT INR: INR: 2.2 (ref 2.0–3.0)

## 2024-06-12 NOTE — Progress Notes (Signed)
 Description   INR-2.2; Continue taking warfarin 5mg  daily except for 7.5mg  on Mondays and Fridays. Recheck INR in 1 week. Clinic [phone]

## 2024-06-12 NOTE — Patient Instructions (Signed)
 Description   INR-2.2; Continue taking warfarin 5mg  daily except for 7.5mg  on Mondays and Fridays. Recheck INR in 1 week. Clinic [phone]

## 2024-06-15 ENCOUNTER — Other Ambulatory Visit: Payer: Self-pay

## 2024-06-15 ENCOUNTER — Other Ambulatory Visit (HOSPITAL_COMMUNITY): Payer: Self-pay

## 2024-06-19 ENCOUNTER — Ambulatory Visit: Attending: Cardiovascular Disease

## 2024-06-19 DIAGNOSIS — I480 Paroxysmal atrial fibrillation: Secondary | ICD-10-CM | POA: Diagnosis not present

## 2024-06-19 DIAGNOSIS — Z7901 Long term (current) use of anticoagulants: Secondary | ICD-10-CM | POA: Diagnosis not present

## 2024-06-19 LAB — POCT INR: INR: 2.1 (ref 2.0–3.0)

## 2024-06-19 NOTE — Progress Notes (Signed)
 INR 2.1 Please see anticoagulation encounter Continue taking warfarin 5mg  daily except for 7.5mg  on Mondays and Fridays. Recheck INR in 3 weeks. Clinic [phone]

## 2024-06-19 NOTE — Patient Instructions (Signed)
 Continue taking warfarin 5mg  daily except for 7.5mg  on Mondays and Fridays. Recheck INR in 3 weeks. Clinic [phone]

## 2024-06-22 ENCOUNTER — Ambulatory Visit (HOSPITAL_BASED_OUTPATIENT_CLINIC_OR_DEPARTMENT_OTHER)
Admission: RE | Admit: 2024-06-22 | Discharge: 2024-06-22 | Disposition: A | Discharge status: Home / Self Care | Source: Ambulatory Visit | Attending: Pulmonary Disease | Admitting: Pulmonary Disease

## 2024-06-22 DIAGNOSIS — R918 Other nonspecific abnormal finding of lung field: Secondary | ICD-10-CM | POA: Diagnosis present

## 2024-06-24 ENCOUNTER — Telehealth: Payer: Self-pay | Admitting: Cardiovascular Disease

## 2024-06-24 ENCOUNTER — Other Ambulatory Visit (HOSPITAL_COMMUNITY): Payer: Self-pay

## 2024-06-24 ENCOUNTER — Ambulatory Visit: Payer: Self-pay | Admitting: Pulmonary Disease

## 2024-06-24 MED ORDER — LOSARTAN POTASSIUM 25 MG PO TABS: 25.0000 mg | ORAL_TABLET | Freq: Every day | ORAL | Status: DC

## 2024-06-24 NOTE — Telephone Encounter (Signed)
 Pt made aware ok to decrease to 25 mg and let us  know if hypotension persists after decrease.  Pt agreeable to plan. MAR updated, new Rx NOT sent in at this time.

## 2024-06-24 NOTE — Telephone Encounter (Signed)
 Pt c/o BP issue: STAT if pt c/o blurred vision, one-sided weakness or slurred speech.  STAT if BP is GREATER than 180/120 TODAY.  STAT if BP is LESS than 90/60 and SYMPTOMATIC TODAY  1. What is your BP concern? Hypotension   2. Have you taken any BP medication today? Yes   3. What are your last 5 BP readings? 84/33 this morning, 117/64 today   4. Are you having any other symptoms (ex. Dizziness, headache, blurred vision, passed out)? Dizziness, blurred vision, presyncope  Pt states she has been having low blood pressure consistently since starting losartan . Blood pressure was very low this morning, but came up some when she checked later. Please advise.

## 2024-06-24 NOTE — Telephone Encounter (Signed)
 Pt reports dizziness/light headedness since starting the Losartan . Near syncope upon standing. BP examples:   94/55, 101/64 (after medications), 84/33 this morning (did increase to 117/64 after some time)  Pt aware will forward to Rockie Hummer to review/advise as Dr Wonda is out of office.  Aware will most likely start with decreasing dose to 25 mg but will await provider advisement.    Pt agreeable to plan.

## 2024-06-25 ENCOUNTER — Other Ambulatory Visit (HOSPITAL_COMMUNITY): Payer: Self-pay

## 2024-06-25 ENCOUNTER — Other Ambulatory Visit: Payer: Self-pay

## 2024-07-10 ENCOUNTER — Ambulatory Visit

## 2024-07-15 ENCOUNTER — Ambulatory Visit

## 2024-07-19 ENCOUNTER — Encounter (HOSPITAL_COMMUNITY): Payer: Self-pay

## 2024-07-19 ENCOUNTER — Observation Stay (HOSPITAL_COMMUNITY)
Admission: EM | Admit: 2024-07-19 | Discharge: 2024-07-20 | Disposition: A | Discharge status: Home / Self Care | Attending: Infectious Diseases | Admitting: Infectious Diseases

## 2024-07-19 ENCOUNTER — Emergency Department (HOSPITAL_COMMUNITY)

## 2024-07-19 DIAGNOSIS — I959 Hypotension, unspecified: Secondary | ICD-10-CM

## 2024-07-19 DIAGNOSIS — M25531 Pain in right wrist: Secondary | ICD-10-CM | POA: Diagnosis not present

## 2024-07-19 DIAGNOSIS — Z8679 Personal history of other diseases of the circulatory system: Secondary | ICD-10-CM | POA: Insufficient documentation

## 2024-07-19 DIAGNOSIS — I480 Paroxysmal atrial fibrillation: Secondary | ICD-10-CM | POA: Diagnosis not present

## 2024-07-19 DIAGNOSIS — R55 Syncope and collapse: Principal | ICD-10-CM | POA: Insufficient documentation

## 2024-07-19 DIAGNOSIS — Z794 Long term (current) use of insulin: Secondary | ICD-10-CM | POA: Insufficient documentation

## 2024-07-19 DIAGNOSIS — S63391A Traumatic rupture of other ligament of right wrist, initial encounter: Secondary | ICD-10-CM | POA: Diagnosis not present

## 2024-07-19 DIAGNOSIS — S6991XA Unspecified injury of right wrist, hand and finger(s), initial encounter: Secondary | ICD-10-CM | POA: Insufficient documentation

## 2024-07-19 DIAGNOSIS — R911 Solitary pulmonary nodule: Secondary | ICD-10-CM | POA: Diagnosis not present

## 2024-07-19 DIAGNOSIS — Z79899 Other long term (current) drug therapy: Secondary | ICD-10-CM | POA: Diagnosis not present

## 2024-07-19 DIAGNOSIS — I509 Heart failure, unspecified: Secondary | ICD-10-CM | POA: Insufficient documentation

## 2024-07-19 DIAGNOSIS — I2510 Atherosclerotic heart disease of native coronary artery without angina pectoris: Secondary | ICD-10-CM | POA: Diagnosis not present

## 2024-07-19 DIAGNOSIS — R350 Frequency of micturition: Secondary | ICD-10-CM | POA: Insufficient documentation

## 2024-07-19 DIAGNOSIS — J151 Pneumonia due to Pseudomonas: Secondary | ICD-10-CM | POA: Insufficient documentation

## 2024-07-19 DIAGNOSIS — Z8673 Personal history of transient ischemic attack (TIA), and cerebral infarction without residual deficits: Secondary | ICD-10-CM | POA: Insufficient documentation

## 2024-07-19 DIAGNOSIS — N179 Acute kidney failure, unspecified: Secondary | ICD-10-CM | POA: Insufficient documentation

## 2024-07-19 DIAGNOSIS — E785 Hyperlipidemia, unspecified: Secondary | ICD-10-CM | POA: Diagnosis not present

## 2024-07-19 DIAGNOSIS — W19XXXA Unspecified fall, initial encounter: Secondary | ICD-10-CM | POA: Diagnosis not present

## 2024-07-19 DIAGNOSIS — Z86718 Personal history of other venous thrombosis and embolism: Secondary | ICD-10-CM | POA: Insufficient documentation

## 2024-07-19 DIAGNOSIS — Z87440 Personal history of urinary (tract) infections: Secondary | ICD-10-CM | POA: Insufficient documentation

## 2024-07-19 DIAGNOSIS — E119 Type 2 diabetes mellitus without complications: Secondary | ICD-10-CM | POA: Insufficient documentation

## 2024-07-19 MED ORDER — GABAPENTIN 300 MG PO CAPS: 300.0000 mg | ORAL_CAPSULE | Freq: Two times a day (BID) | ORAL | Status: DC | PRN

## 2024-07-19 MED ORDER — MONTELUKAST SODIUM 10 MG PO TABS
10.0000 mg | ORAL_TABLET | Freq: Every day | ORAL | Status: DC
  Administered 2024-07-19: 10 mg via ORAL
  Filled 2024-07-19: qty 1

## 2024-07-19 MED ORDER — IOHEXOL 350 MG/ML SOLN
75.0000 mL | Freq: Once | INTRAVENOUS | Status: AC | PRN
  Administered 2024-07-19: 75 mL via INTRAVENOUS

## 2024-07-19 MED ORDER — EZETIMIBE 10 MG PO TABS
10.0000 mg | ORAL_TABLET | Freq: Every day | ORAL | Status: DC
  Administered 2024-07-20: 10 mg via ORAL
  Filled 2024-07-19: qty 1

## 2024-07-19 MED ORDER — ACETAMINOPHEN 500 MG PO TABS
500.0000 mg | ORAL_TABLET | Freq: Four times a day (QID) | ORAL | Status: DC | PRN
  Administered 2024-07-20: 500 mg via ORAL
  Filled 2024-07-19: qty 1

## 2024-07-19 MED ORDER — SODIUM CHLORIDE 0.9 % IV SOLN
1.0000 g | INTRAVENOUS | Status: DC
  Administered 2024-07-19: 1 g via INTRAVENOUS
  Filled 2024-07-19: qty 10

## 2024-07-19 MED ORDER — LACTATED RINGERS IV BOLUS
1000.0000 mL | Freq: Once | INTRAVENOUS | Status: AC
  Administered 2024-07-19: 1000 mL via INTRAVENOUS

## 2024-07-19 MED ORDER — TIZANIDINE HCL 4 MG PO TABS: 4.0000 mg | ORAL_TABLET | Freq: Two times a day (BID) | ORAL | Status: DC | PRN

## 2024-07-19 MED ORDER — WARFARIN - PHARMACIST DOSING INPATIENT: Freq: Every day | Status: DC

## 2024-07-19 MED ORDER — INSULIN ASPART 100 UNIT/ML IJ SOLN: 0.0000 [IU] | Freq: Every day | INTRAMUSCULAR | Status: DC

## 2024-07-19 MED ORDER — PANTOPRAZOLE SODIUM 40 MG PO TBEC
40.0000 mg | DELAYED_RELEASE_TABLET | Freq: Every day | ORAL | Status: DC
  Administered 2024-07-20: 40 mg via ORAL
  Filled 2024-07-19: qty 1

## 2024-07-19 MED ORDER — SODIUM CHLORIDE 0.9 % IV BOLUS
1000.0000 mL | Freq: Once | INTRAVENOUS | Status: AC
  Administered 2024-07-19: 1000 mL via INTRAVENOUS

## 2024-07-19 MED ORDER — INSULIN GLARGINE-YFGN 100 UNIT/ML ~~LOC~~ SOLN
10.0000 [IU] | Freq: Every day | SUBCUTANEOUS | Status: DC
  Administered 2024-07-19 – 2024-07-20 (×2): 10 [IU] via SUBCUTANEOUS
  Filled 2024-07-19 (×4): qty 0.1

## 2024-07-19 MED ORDER — CLOPIDOGREL BISULFATE 75 MG PO TABS
75.0000 mg | ORAL_TABLET | Freq: Every day | ORAL | Status: DC
  Administered 2024-07-20: 75 mg via ORAL
  Filled 2024-07-19: qty 1

## 2024-07-19 MED ORDER — INSULIN ASPART 100 UNIT/ML IJ SOLN
0.0000 [IU] | Freq: Three times a day (TID) | INTRAMUSCULAR | Status: DC
  Administered 2024-07-19 – 2024-07-20 (×2): 2 [IU] via SUBCUTANEOUS
  Administered 2024-07-20: 1 [IU] via SUBCUTANEOUS
  Filled 2024-07-19: qty 2
  Filled 2024-07-19: qty 1
  Filled 2024-07-19: qty 2

## 2024-07-19 MED ORDER — HEPARIN SODIUM (PORCINE) 5000 UNIT/ML IJ SOLN: 5000.0000 [IU] | Freq: Three times a day (TID) | INTRAMUSCULAR | Status: DC

## 2024-07-19 MED ORDER — ATORVASTATIN CALCIUM 80 MG PO TABS
80.0000 mg | ORAL_TABLET | Freq: Every day | ORAL | Status: DC
  Administered 2024-07-20: 80 mg via ORAL
  Filled 2024-07-19: qty 1

## 2024-07-20 ENCOUNTER — Other Ambulatory Visit (HOSPITAL_COMMUNITY): Payer: Self-pay

## 2024-07-20 DIAGNOSIS — R911 Solitary pulmonary nodule: Secondary | ICD-10-CM

## 2024-07-20 DIAGNOSIS — I5030 Unspecified diastolic (congestive) heart failure: Secondary | ICD-10-CM

## 2024-07-20 DIAGNOSIS — M25531 Pain in right wrist: Secondary | ICD-10-CM

## 2024-07-20 DIAGNOSIS — Z9181 History of falling: Secondary | ICD-10-CM

## 2024-07-20 DIAGNOSIS — E119 Type 2 diabetes mellitus without complications: Secondary | ICD-10-CM

## 2024-07-20 DIAGNOSIS — N179 Acute kidney failure, unspecified: Secondary | ICD-10-CM

## 2024-07-20 DIAGNOSIS — I4891 Unspecified atrial fibrillation: Secondary | ICD-10-CM | POA: Diagnosis not present

## 2024-07-20 DIAGNOSIS — I2510 Atherosclerotic heart disease of native coronary artery without angina pectoris: Secondary | ICD-10-CM

## 2024-07-20 DIAGNOSIS — Z8679 Personal history of other diseases of the circulatory system: Secondary | ICD-10-CM

## 2024-07-20 DIAGNOSIS — Z8673 Personal history of transient ischemic attack (TIA), and cerebral infarction without residual deficits: Secondary | ICD-10-CM

## 2024-07-20 DIAGNOSIS — Z8719 Personal history of other diseases of the digestive system: Secondary | ICD-10-CM

## 2024-07-20 MED ORDER — LOSARTAN POTASSIUM 25 MG PO TABS
25.0000 mg | ORAL_TABLET | Freq: Every day | ORAL | 0 refills | Status: DC
  Filled 2024-07-20: qty 30, 30d supply, fill #0

## 2024-07-20 MED ORDER — SODIUM CHLORIDE 0.9 % IV SOLN
1.0000 g | INTRAVENOUS | Status: DC
  Filled 2024-07-20 (×4): qty 10

## 2024-07-20 MED ORDER — POTASSIUM CHLORIDE 20 MEQ PO PACK
40.0000 meq | PACK | Freq: Once | ORAL | Status: AC
  Administered 2024-07-20: 40 meq via ORAL
  Filled 2024-07-20: qty 2

## 2024-07-20 MED ORDER — AMLODIPINE BESYLATE 10 MG PO TABS
10.0000 mg | ORAL_TABLET | Freq: Every day | ORAL | 0 refills | Status: DC
  Filled 2024-07-20: qty 30, 30d supply, fill #0

## 2024-07-20 MED ORDER — MAGNESIUM SULFATE 2 GM/50ML IV SOLN
2.0000 g | Freq: Once | INTRAVENOUS | Status: AC
  Administered 2024-07-20: 2 g via INTRAVENOUS
  Filled 2024-07-20: qty 50

## 2024-07-20 MED ORDER — LEVOFLOXACIN 250 MG PO TABS
250.0000 mg | ORAL_TABLET | Freq: Every day | ORAL | 0 refills | Status: DC
  Filled 2024-07-20: qty 4, 4d supply, fill #0

## 2024-07-20 MED ORDER — WARFARIN SODIUM 2.5 MG PO TABS: 5.0000 mg | ORAL_TABLET | ORAL | Status: DC

## 2024-07-20 MED ORDER — LOSARTAN POTASSIUM 50 MG PO TABS
25.0000 mg | ORAL_TABLET | Freq: Once | ORAL | Status: AC
  Administered 2024-07-20: 25 mg via ORAL
  Filled 2024-07-20: qty 1

## 2024-07-20 MED ORDER — WARFARIN SODIUM 5 MG PO TABS
5.0000 mg | ORAL_TABLET | Freq: Once | ORAL | Status: AC
  Filled 2024-07-20: qty 1

## 2024-07-20 MED ORDER — AMLODIPINE BESYLATE 10 MG PO TABS
10.0000 mg | ORAL_TABLET | Freq: Once | ORAL | Status: DC
  Filled 2024-07-20: qty 1

## 2024-07-21 ENCOUNTER — Other Ambulatory Visit: Payer: Self-pay | Admitting: Student

## 2024-07-21 ENCOUNTER — Encounter: Payer: Self-pay | Admitting: Cardiovascular Disease

## 2024-07-21 ENCOUNTER — Other Ambulatory Visit (HOSPITAL_COMMUNITY): Payer: Self-pay

## 2024-07-21 DIAGNOSIS — N3000 Acute cystitis without hematuria: Secondary | ICD-10-CM

## 2024-07-21 LAB — URINE CULTURE: Culture: >=100000 — AB

## 2024-07-21 MED ORDER — CEPHALEXIN 500 MG PO CAPS
500.0000 mg | ORAL_CAPSULE | Freq: Two times a day (BID) | ORAL | 0 refills | Status: AC
  Filled 2024-07-21: qty 8, 4d supply, fill #0

## 2024-07-21 NOTE — Progress Notes (Signed)
 Change in UTI tx to Keflex  per urine culture data. Pt advised to stop levofloxacin .

## 2024-07-22 ENCOUNTER — Ambulatory Visit: Attending: Cardiovascular Disease | Admitting: Pharmacist

## 2024-07-22 DIAGNOSIS — I4891 Unspecified atrial fibrillation: Secondary | ICD-10-CM

## 2024-07-22 DIAGNOSIS — Z7901 Long term (current) use of anticoagulants: Secondary | ICD-10-CM | POA: Diagnosis not present

## 2024-07-22 DIAGNOSIS — I480 Paroxysmal atrial fibrillation: Secondary | ICD-10-CM | POA: Diagnosis not present

## 2024-07-22 LAB — POCT INR: INR: 1.4 — AB (ref 2.0–3.0)

## 2024-07-22 NOTE — Patient Instructions (Signed)
 Description   INR 1.4. Take 2 tablets today, 1 on Thursday, and 2 on Friiday and then continue taking warfarin 5mg  daily except for 7.5mg  on Mondays and Fridays. Recheck INR in 2 weeks. Clinic [phone]

## 2024-07-22 NOTE — Progress Notes (Signed)
 Description   INR 1.4. Take 2 tablets today, 1 on Thursday, and 2 on Friiday and then continue taking warfarin 5mg  daily except for 7.5mg  on Mondays and Fridays. Recheck INR in 2 weeks. Clinic [phone]

## 2024-07-23 ENCOUNTER — Other Ambulatory Visit (HOSPITAL_COMMUNITY): Payer: Self-pay

## 2024-07-24 ENCOUNTER — Other Ambulatory Visit: Payer: Self-pay

## 2024-07-24 LAB — CULTURE, BLOOD (ROUTINE X 2)
Culture: NO GROWTH
Special Requests: ADEQUATE

## 2024-07-27 ENCOUNTER — Other Ambulatory Visit

## 2024-07-29 ENCOUNTER — Ambulatory Visit (HOSPITAL_BASED_OUTPATIENT_CLINIC_OR_DEPARTMENT_OTHER): Admitting: Pulmonary Disease

## 2024-07-29 ENCOUNTER — Encounter (HOSPITAL_BASED_OUTPATIENT_CLINIC_OR_DEPARTMENT_OTHER): Payer: Self-pay | Admitting: Pulmonary Disease

## 2024-07-29 VITALS — BP 142/86 | HR 71 | Temp 98.1°F | Wt 116.9 lb

## 2024-07-29 DIAGNOSIS — K219 Gastro-esophageal reflux disease without esophagitis: Secondary | ICD-10-CM

## 2024-07-29 DIAGNOSIS — I10 Essential (primary) hypertension: Secondary | ICD-10-CM | POA: Diagnosis not present

## 2024-07-29 DIAGNOSIS — R918 Other nonspecific abnormal finding of lung field: Secondary | ICD-10-CM | POA: Diagnosis not present

## 2024-07-29 DIAGNOSIS — Z955 Presence of coronary angioplasty implant and graft: Secondary | ICD-10-CM

## 2024-07-29 DIAGNOSIS — E119 Type 2 diabetes mellitus without complications: Secondary | ICD-10-CM

## 2024-07-29 DIAGNOSIS — I2510 Atherosclerotic heart disease of native coronary artery without angina pectoris: Secondary | ICD-10-CM | POA: Diagnosis not present

## 2024-07-29 NOTE — Patient Instructions (Addendum)
 PET-avid left upper lobe lung nodule measuring 2.0 x 1.5 cm - Likely infectious/inflammatory Left ground glass subpleural nodule - NEW Multiple pulmonary nodules lung nodule --Prior bronchoscopy in 2023 malignancy however recommend continued surveillance based on PET-avidity and size of lung --Reviewed CT Chest 06/2024. No change in left apical nodule measuring 1.9 x 1.3 cm with adjacent fiducial, new subpleural GGO measuring 1.3x 1 cm in left apex. Unchanged RML subpleural nodule in RML --ORDER CT Chest without contrast in 6 months (end of April)  Hypertension --On metoprolol  and losartan  --Elevated in office today. Please discuss with your primary care regarding management --Recheck BP at home --Discussed red flags and signs/symptoms to warrant going to emergency room

## 2024-07-29 NOTE — Progress Notes (Signed)
 Subjective:   PATIENT ID: [name] [name] GENDER: female DOB: 09/12/1940, MRN: [id number]   HPI  Chief Complaint  Patient presents with   Follow-up    Reason for Visit: Follow-up   [name] is an 83 year old female never smoker with CAD s/p DES x 4 (last 2021), HTN, DM2, GERD, hx blood clots related to trauma s/p excision who presents for follow-up.  Initial consult She recently was seen by her PCP for fever and fatigue with no improvement on Z-pack. Dx with UTI however noted to have abnormal CXR leading to evaluation with CT Chest with demonstrated nodular density measuring 19 x 14 mm in the left apex concerning for malignancy. No mediastinal or hilar adenopathy.  At baseline she sometimes ambulate with a cane. Currently living independently and perform ADLs but is moving to a senior assistance for cleaning and meal assistance. Daughter reports she is sometimes a fall risk.  She walks up stairs slowly and will get winded. Denies cough, wheezing, hemoptysis, unintentional weight loss, night sweats or unexplained fevers/chills.  04/30/22 Daughter present to provide additional history. Reports intermittent low-grade fevers and fatigue. Denies significant weight loss or appetite change. Denies night sweats.  05/28/22 Daughter present and provides additional history with patient. Over two weeks ago she reports mechanical fall with left facial bruising and left knee swelling and bruising. Leg is tender with walking. She underwent navigational bronchoscopy on 9/14 which she tolerated well Denies shortness of breath, cough or wheezing. No unintentional weight loss, night sweats, unexplained fevers or chills.  07/30/22 Daughter presents. Recently had fever with UTI. Denies shortness of breath, cough or wheezing. No unintentional weight loss, change in appetite, night sweats.  11/27/22 She reports cough with pollen but otherwise no symptoms. Denies shortness of breath or wheezing.  Daughter, daughter and granddaughter (1 year) was present. She states that we also care for her family member [name] [name].  11/21/23 Presents for follow-up for lung nodules. No respiratory symptoms, denies shortness of breath, cough or wheezing. Did not receive CT chest for this month but has recent imaging from October and CT A/P available for review.  07/29/24 Denies shortness of breath and cough. Recent fall over the weekend requiring ED visit. Using new rollator. Presents with her son to review CT imaging. Reports several scans since our last visit for various reasons including fall and abdominal pain. Currently denies shortness of breath, cough, wheezing, chest pain, weight loss, night sweats.  Social History: Never smoker Significant second smoke exposure Mother with pancreatic cancer Brother with lung cancer, heavy smoker Brother with laryngeal cancer, [place]  Environmental exposures: Significant mold in apartment  Past Medical History:  Diagnosis Date   Allergy    Anal fissure    Anxiety    CAD (coronary artery disease)    s/p inf MI 2007 - tx w/ DES to RCA // s/p POBA to [facility] and DES to dRCA in 11/18 ([facility] in [place], [place]) // Myoview  3/21: low risk // s/p DES to pRCA   Cataract    removed both eyes   Cerebrovascular disease    Carotid US  09/2017 [facility]): mild plaque (<50%) in both carotid arteries   Chronic neck pain    Chronic pain syndrome    Diabetes Mellitus, Type 2    Diverticular disease    DJD (degenerative joint disease)    Echocardiogram 10/2018    Echo 10/2018: EF 55-60, normal RVSF, mod MAC, mod TR, severe AoV calcification  and sclerosis with nodular calcium /mobile area of calcium  in the LVOT (small veg vs Lambl's excrescence  - consider TEE), mild AI, mild AS (mean 11).    Echocardiogram 04/2019    Echocardiogram 04/2019: EF 60-65, basal septal hypertrophy, grade 2 diastolic dysfunction, normal wall motion, normal RV SF, mild LAE, mod MAC,  trivial MR, mod sclerosis of the aortic valve with mod aortic annular calcification, thin mobile filamentous structure on ventricular side of AV likely representing Lambl's excrescence, mild AI, mild TR   Frequent UTI's    Gastroesophageal reflux disease    H/O bacterial endocarditis    Rx w IV Abxs in 2020 ([facility] in [place], [place]) // F/u echo with AoV Lambl's excrescence   H/O hiatal hernia    HTN (hypertension)    Hx of fall 10/2020   Hx of MI 2007   Hx of Stroke    IBS (irritable bowel syndrome)    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 (Cervical Fusion)   Premature ventricular contractions    Tubular adenoma of colon    Ulcer    [facility]   Urinary incontinence      Family History  Problem Relation Age of Onset   Heart disease Mother    Pancreatic cancer Mother    Colon polyps Sister    Pancreatitis Sister    Lung cancer Brother    Cancer - Other Brother        vocal cord cancer   Coronary artery disease Other        sibling   Coronary artery disease Other        sibling   Colon cancer Neg Hx    Esophageal cancer Neg Hx    Rectal cancer Neg Hx    Stomach cancer Neg Hx      Social History   Occupational History    Employer: RETIRED  Tobacco Use   Smoking status: Never    Passive exposure: Never   Smokeless tobacco: Never  Vaping Use   Vaping status: Never Used  Substance and Sexual Activity   Alcohol  use: No   Drug use: No   Sexual activity: Yes    Partners: Male    Allergies  Allergen Reactions   Sulfa  Antibiotics Itching   Codeine Nausea Only   Loop Diuretics (Sulfonamide) Itching    Listed as sulfonamide in chart that needs merged   Niacin  And  Related Other (See Comments)    Must take Flush-free   Prednisone Itching   Rocephin  [Ceftriaxone ] Itching   Sulfa  Antibiotics Itching   Codeine Nausea Only   Macrobid  [Nitrofurantoin ] Nausea Only    Severe nausea   Niacin  Other (See Comments)    Must take Flush-free   Prednisone Itching and Rash   Rocephin  [Ceftriaxone ] Itching    Has taken cefepime  without issue   Sulfonamide Derivatives Itching   Tetracycline Itching and Rash   Tetracyclines & Related Itching and Rash     Outpatient Medications Prior to Visit  Medication Sig Dispense Refill   Accu-Chek Softclix Lancets lancets Use to check blood sugar 2 (two) times daily. 100 each 3  Accu-Chek Softclix Lancets lancets Use to monitor Blood Sugars daily. 100 each 3   acetaminophen  (TYLENOL ) 325 MG tablet Take 650 mg by mouth every 6 (six) hours as needed for moderate pain.     Ascorbic Acid  (VITAMIN C ) 500 MG CAPS Take 500 mg by mouth in the morning and at bedtime.     atorvastatin  (LIPITOR ) 80 MG tablet Take 1 tablet (80 mg total) by mouth at bedtime. 90 tablet 3   atorvastatin  (LIPITOR ) 80 MG tablet Take 80 mg by mouth daily.     Blood Glucose Monitoring Suppl (ACCU-CHEK GUIDE) w/Device KIT Use to monitor Blood Sugars daily. 1 kit 0   Cinnamon 500 MG capsule Take 2 capsules twice a day by oral route as directed.     clopidogrel  (PLAVIX ) 75 MG tablet Take 1 tablet (75 mg total) by mouth in the morning to prevent blood clots. 90 tablet 3   clopidogrel  (PLAVIX ) 75 MG tablet Take 75 mg by mouth daily.     dicyclomine  (BENTYL ) 10 MG capsule Take 1 capsule (10 mg) by mouth every 8 hours (up to 3 times per day) for abdominal pain/spasms as needed. 270 capsule 3   dicyclomine  (BENTYL ) 10 MG capsule Take 10 mg by mouth 3 (three) times daily.     ezetimibe  (ZETIA ) 10 MG tablet Take 1 tablet (10 mg total) by mouth daily for cholesterol. 90 tablet 3   ezetimibe  (ZETIA ) 10 MG tablet Take 10 mg by mouth daily.     famotidine  (PEPCID ) 20  MG tablet Take 1 tablet (20 mg total) by mouth daily as needed for severe indigestion and heartburn 90 tablet 3   famotidine  (PEPCID ) 20 MG tablet Take 20 mg by mouth 2 (two) times daily.     gabapentin  (NEURONTIN ) 300 MG capsule Take 1 capsule by oral route every 8 hours (up to 3 times daily) for nerve/chronic pain. 270 capsule 3   glucose blood (ACCU-CHEK GUIDE TEST) test strip Use to monitor Blood Sugars daily. 300 strip 3   glucose blood (ASSURE PRISM  MULTI TEST) test strip Use strips as directed to check blood sugar twice daily 300 strip 3   insulin  aspart (NOVOLOG ) 100 UNIT/ML FlexPen Before each meal 3 times a day, 140-199 - 2 units, 200-250 - 4 units, 251-299 - 6 units,  300-349 - 8 units,  350 or above 10 units.     montelukast  (SINGULAIR ) 10 MG tablet Take 1 tablet (10 mg total) by mouth at bedtime for allergies. 90 tablet 3   Omega-3 Fatty Acids (FISH OIL) 1000 MG CAPS Take 1,000 mg by mouth in the morning and at bedtime.     ondansetron  (ZOFRAN -ODT) 4 MG disintegrating tablet Take 4 mg by mouth every 8 (eight) hours as needed for nausea or vomiting.     pantoprazole  (PROTONIX ) 40 MG tablet Take 1 tablet (40 mg total) by mouth 2 (two) times daily for indigestion and heart burn. 180 tablet 3   pantoprazole  (PROTONIX ) 40 MG tablet Take 40 mg by mouth 2 (two) times daily.     tiZANidine  (ZANAFLEX ) 4 MG tablet Take 1 tablet by mouth twice daily as needed for chronic shoulder/neck pain. 180 tablet 3   tiZANidine  (ZANAFLEX ) 4 MG tablet Take 4 mg by mouth 3 (three) times daily.     venlafaxine  XR (EFFEXOR -XR) 37.5 MG 24 hr capsule Take 1 capsule (37.5 mg total) by mouth daily for anxiety and depression. 90 capsule 3   venlafaxine  XR (EFFEXOR -XR) 37.5 MG 24 hr  capsule Take 37.5 mg by mouth daily.     warfarin (COUMADIN ) 2.5 MG tablet Take 2 tablets daily except 1&1/2 tablets on Thursdays or as directed by anticoagulation clinic 60 tablet 3   warfarin (COUMADIN ) 2.5 MG tablet Take 2-3 tablets  (5-7.5 mg total) by mouth See admin instructions. 11/17 - 11/11: While on levofloxacin , take warfarin 5mg  ( 2 tabets) by mouth daily 11/12 and on: take 3 tablets (7.5 mg) by mouth every Mon and Fri and  take two tablets (5 mg) by mouth all other days OR per anticoagulation clinic     gabapentin  (NEURONTIN ) 300 MG capsule Take 300 mg by mouth 3 (three) times daily.     Insulin  Aspart FlexPen (NOVOLOG ) 100 UNIT/ML  (Patient not taking: Reported on 07/29/2024)     insulin  glargine (LANTUS  SOLOSTAR) 100 UNIT/ML Solostar Pen Inject 10 Units into the skin daily for diabetes. 15 mL 3   insulin  glargine (LANTUS ) 100 UNIT/ML injection Inject 10 Units into the skin daily.     Insulin  Pen Needle (PEN NEEDLES) 32G X 4 MM MISC Use as directed to administer insulin  daily. 100 each 3   losartan  (COZAAR ) 25 MG tablet Take 1 tablet (25 mg total) by mouth daily.     losartan  (COZAAR ) 25 MG tablet Take 1 tablet (25 mg total) by mouth daily. 30 tablet 0   metoprolol  tartrate (LOPRESSOR ) 100 MG tablet Take 1 tablet (100 mg total) by mouth 2 (two) times daily. (Patient taking differently: Take 100 mg by mouth 2 (two) times daily. Per patient taking 1/2 tablet (50) mg twice daily) 180 tablet 3   metoprolol  tartrate (LOPRESSOR ) 50 MG tablet Take 50 mg by mouth 2 (two) times daily.     montelukast  (SINGULAIR ) 10 MG tablet Take 10 mg by mouth daily.     nitrofurantoin  (MACRODANTIN ) 50 MG capsule Take 1 capsule (50 mg total) by mouth daily for UTI prevention. 90 capsule 3   nitroGLYCERIN  (NITROSTAT ) 0.4 MG SL tablet Dissolve 1 tablet under your tongue at the first sign of heart attack/chest pain; no more than 3 tablets within a 15 minute period. 25 tablet 3   nitroGLYCERIN  (NITROSTAT ) 0.4 MG SL tablet Place 0.4 mg under the tongue every 5 (five) minutes as needed for chest pain.     No facility-administered medications prior to visit.    Review of Systems  Constitutional:  Negative for chills, diaphoresis, fever,  malaise/fatigue and weight loss.  HENT:  Negative for congestion.   Respiratory:  Negative for cough, hemoptysis, sputum production, shortness of breath and wheezing.   Cardiovascular:  Negative for chest pain, palpitations and leg swelling.     Objective:   Vitals:   07/29/24 1300  BP: (!) 183/72  Pulse: 71  Temp: 98.1 F (36.7 C)  SpO2: 95%  Weight: 116 lb 14.4 oz (53 kg)    SpO2: 95 %  Physical Exam: General: Well-appearing, no acute distress HENT: [place], AT Eyes: EOMI, no scleral icterus Respiratory: Clear to auscultation bilaterally.  No crackles, wheezing or rales Cardiovascular: RRR, -M/R/G, no JVD Extremities:-Edema,-tenderness Neuro: AAO x4, CNII-XII grossly intact Psych: Normal mood, normal affect  Data Reviewed:  Imaging: CT Chest 03/22/22 - nodular density measuring 19 x 14 mm in the left apex. No hilar or mediastinal adenopathy PET 04/20/22 - Hypermetabolic 1.8 cm LUL apical lung nodule. No adenopathy or mets. CT Chest 07/18/22 - unchanged LUL apical nodule. Few basilar nodules developed in the interval in bases CT Chest 11/23/22 - Similar  LUL apical nodule 2.0 x 1.5 cm CT Chest 06/11/23 - Left apical nodule with difucial marker decreased in size measuring 1.9 x 1.4 cm on series 5, image 13, previously 2.1 x 1.7 cm. Stable LLL ground glass nodule measuring 6 mm. Subpleural RLL previously 6 x4 mm, increased to 12 x 6 mm. Stable solid right lower lobe nodule measuring 4 mm on image 95. Stable solid 3 mm nodule of the right lower lobe CT 02/12/24 - No PE. Stable 1.9 cm x 1.4 cm in left apical nodule. RLL is more ground glass CT CAP 03/23/24 - Visualized lung left apical nodularity overall stable but increased lucency. TML lobe 1.4 by 0.8 cm stable CT Chest 06/22/24 - No change in left apical nodule measuring 1.9 x 1.3 cm with adjacent fiducial, new subpleural GGO measuring 1.3x 1 cm in left apex. Unchanged RML subpleural nodule in RML  PFT: None on file  Labs: CBC     Component Value Date/Time   WBC 9.8 07/20/2024 0427   RBC 4.22 07/20/2024 0427   HGB 13.0 07/20/2024 0427   HGB 14.3 12/21/2021 1538   HCT 40.1 07/20/2024 0427   HCT 42.7 12/21/2021 1538   PLT 224 07/20/2024 0427   PLT 239 12/21/2021 1538   MCV 95.0 07/20/2024 0427   MCV 94 12/21/2021 1538   MCH 30.8 07/20/2024 0427   MCHC 32.4 07/20/2024 0427   RDW 13.5 07/20/2024 0427   RDW 12.3 12/21/2021 1538   LYMPHSABS 2.4 03/26/2024 0430   MONOABS 1.1 (H) 03/26/2024 0430   EOSABS 0.3 03/26/2024 0430   BASOSABS 0.1 03/26/2024 0430   Absolute 03/05/22 - 200     Assessment & Plan:   Discussion: 83 year old female never smoker with multiple pulmonary nodules, CAD s/p DES x 4, HTN, DM2, GERD and hx blood clots secondary to trauma s/p excision who presents for follow-up. Prior bronchoscopy 05/17/22 for PET avid LUL nodule measuring 1.8 cm was negative for malignancy. Despite no evidence of malignancy on biopsy concerned for false negative given PET avidity of nodule.  Reviewed CT imaging. The lesion of initial concern, the left upper lobe nodule that was PET -avid has been stable since 2023. RML nodule also stable. New ground glass nodule measuring 1.3x 1 cm in left apex that will require surveillance.   PET-avid left upper lobe lung nodule measuring 2.0 x 1.5 cm - Likely infectious/inflammatory Left ground glass subpleural nodule - NEW Multiple pulmonary nodules lung nodule --Prior bronchoscopy in 2023 malignancy however recommend continued surveillance based on PET-avidity and size of lung --Reviewed CT Chest 06/2024. No change in left apical nodule measuring 1.9 x 1.3 cm with adjacent fiducial, new subpleural GGO measuring 1.3x 1 cm in left apex. Unchanged RML subpleural nodule in RML --ORDER CT Chest without contrast in 6 months (end of April)  Hypertension --On metoprolol  and losartan  --Elevated in office today. Please discuss with your primary care regarding management --Recheck BP at  home --Discussed red flags and signs/symptoms to warrant going to emergency room  Health Maintenance Immunization History  Administered Date(s) Administered   Fluad Quad(high Dose 65+) 04/30/2019, 06/27/2020, 05/12/2021, 07/09/2022   INFLUENZA, HIGH DOSE SEASONAL PF 07/20/2015, 05/24/2016, 06/18/2018   Influenza Split 07/18/2011, 05/19/2012   Influenza Whole 06/22/2009, 05/15/2010   Influenza,inj,Quad PF,6+ Mos 06/15/2013, 05/21/2014   Influenza-Unspecified 06/24/2017   Pneumococcal Conjugate-13 01/20/2014   Pneumococcal Polysaccharide-23 11/08/2014, 11/13/2017   Tdap 01/20/2014, 05/11/2022   Zoster, Live 07/08/2014   CT Lung Screen - not qualified  due to age  Orders Placed This Encounter  Procedures   CT Chest Wo Contrast    Standing Status:   Future    Expiration Date:   07/29/2025    Scheduling Instructions:     Schedule in 6 months (April 2026)    Preferred imaging location?:   [facility]  No orders of the defined types were placed in this encounter.   Return in about 6 months (around 01/26/2025) for after CT scan in April/May.   I have spent a total time of 32-minutes on the day of the appointment including chart review, data review, collecting history, coordinating care and discussing medical diagnosis and plan with the patient/family. Past medical history, allergies, medications were reviewed. Pertinent imaging, labs and tests included in this note have been reviewed and interpreted independently by me.  Deshawnda Acrey Slater Staff, MD [facility] 07/29/2024

## 2024-08-04 ENCOUNTER — Other Ambulatory Visit: Payer: Self-pay

## 2024-08-04 ENCOUNTER — Encounter: Payer: Self-pay | Admitting: *Deleted

## 2024-08-04 ENCOUNTER — Ambulatory Visit

## 2024-08-04 ENCOUNTER — Ambulatory Visit: Admitting: Physician Assistant

## 2024-08-04 NOTE — Assessment & Plan Note (Signed)
 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  She underwent POBA to the mid RCA stent and DES to the distal RCA (overlapping with the prior stent) in 07/2017 in [place]  and DES to the RCA in 7/21.  Her last PCI was a DES to the LAD in 06/2021.  Nuclear stress test in 11/2023 was low risk. She was kept on Clopidogrel  due to hx of multiple stents. ***

## 2024-08-04 NOTE — Progress Notes (Unsigned)
 OFFICE NOTE:    Date:  08/05/2024  ID:  [name] [name], DOB 13-Mar-1941, MRN [id number] PCP: [name] Jarvis, NP  [facility] Providers Cardiologist:  [name] Fell, MD Cardiology APP:  [name] Glendia DASEN, PA-C        Coronary artery disease  S/p Inf MI in 2007 >> PCI: DES to RCA S/p POBA to [name] [name] [name] and DES to dRCA in 07/2017 ([name] in [place], [place]) Mid LAD mod dz >> neg by FFR Myoview  11/2019: no ischemia  Cath 03/09/20: pRCA 95 >> PCI: 2.75 x 18 mm DES  S/p 3 x 18 mm DES to pLAD in 06/2021 [name] [name] 06/28/21: pLAD 70 (RFR 0.88) >> PCI; mLAD 30; pLCx 30; mRCA stents patent Pharm MPI 11/15/23: no ischemia, EF 71, low risk  Paroxysmal atrial fibrillation  Dx during admit 02/2024 Coumadin  Rx  Bacterial endocarditis  Admitted to [name] in [place], [place] Rx with IV antibiotics x 3 weeks Echocardiogram 10/2018: EF 55-60, AoV calcification (?Lambl's Excrescence) 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 TTE 02/13/24: EF 65-70, no RWMA, mild LVH, mild LAE, small pericardial effusion, mild MR< mild AI, AV sclerosis Palpitations: SVT/PACs Monitor 12/2023: occ short SV runs (longest 17.9 sec), PACs 5% Hypertension  Amlodipine  DC'd due to edema  Hyperlipidemia  Diabetes mellitus  Carotid US  01/15/22: Bilat ICA 1-39 Lung nodule  Followed by Pulmonology   Admx for small bowel enteritis, Pseudomonas UTI 03/2024  Hx of DVT 2/2 trauma        Discussed the use of AI scribe software for clinical note transcription with the patient, who gave verbal consent to proceed. History of Present Illness [name] [name] is a 83 y.o. female for follow up of CAD, AFib.   Last seen by Dr. Fell in 05/2024. Amlodipine  was switched to Losartan  at that time due to leg edema.  She was admitted 11/16-11/17 with syncope felt to be due to volume depletion in the setting of probably UTI. She had evidence of AKI on admission with SCr up to 2.43 (SCr 0.78 in July 2025). She responded to  IVFs and was started on antibiotics to cover for UTI. She had a R scapholunate ligament injury and was placed in a splint with plans to follow up with ortho. SCr improved to 1.19. CT showed stable 1.5 cm L lung apex nodule. She had follow up with Dr. Kassie last week in Pulmonology Clinic.   She is accompanied by her granddaughter, [name] [name]. Her blood pressure at home has been variable. No shortness of breath or chest discomfort. No swelling since stopping amlodipine , no bleeding, black stools, or bloody urine. She is currently on Coumadin  and Plavix .    ROS-See HPI    Studies Reviewed:  EKG Interpretation Date/Time:  Wednesday August 05 2024 08:58:25 EST Ventricular Rate:  77 PR Interval:  168 QRS Duration:  70 QT Interval:  400 QTC Calculation: 452 R Axis:   -21  Text Interpretation: Sinus rhythm with Premature supraventricular complexes No significant change since last tracing Confirmed by [name] Glendia [phone]) on 08/05/2024 9:02:12 AM    LABS 07/19/24: ALT 18, TP 5.9, Alb 2.8, A1c 7.3 07/20/24: K 3.4, SCr 1.19, eGFR 45, Hgb 13, PLT 224K  Risk Assessment/Calculations: CHA2DS2-VASc Score = 6   This indicates a 9.7% annual risk of stroke. The patient's score is based upon: CHF History: 0 HTN History: 1 Diabetes History: 1 Stroke History: 0 Vascular Disease History: 1 Age Score: 2 Gender Score: 1  HYPERTENSION CONTROL Vitals:   08/05/24 0854 08/05/24 1418  BP: (!) 140/70 (!) 190/80    The patient's blood pressure is elevated above target today.  In order to address the patient's elevated BP: A current anti-hypertensive medication was adjusted today.; Blood pressure will be monitored at home to determine if medication changes need to be made.         Physical Exam:  VS:  BP (!) 190/80   Pulse 77   Ht 5' (1.524 m)   Wt 120 lb 3.2 oz (54.5 kg)   SpO2 96%   BMI 23.47 kg/m        Wt Readings from Last 3 Encounters:  08/05/24 120 lb 3.2 oz (54.5 kg)  07/29/24  116 lb 14.4 oz (53 kg)  07/19/24 118 lb (53.5 kg)    Constitutional:      Appearance: Healthy appearance. Not in distress.  Neck:     Vascular: No JVR. JVD normal.  Pulmonary:     Breath sounds: Normal breath sounds. No wheezing. No rales.  Cardiovascular:     Normal rate. Irregular rhythm.     Murmurs: There is a grade 1/6 systolic murmur at the URSB.  Edema:    Peripheral edema absent.  Abdominal:     Palpations: Abdomen is soft.       Assessment and Plan:    Assessment & Plan Coronary artery disease involving native coronary artery of native heart without angina pectoris Hx of inferior MI in 2007 tx with DES to the RCA.  She underwent POBA to the mid RCA stent and DES to the distal RCA (overlapping with the prior stent) in 07/2017 in [place]  and DES to the RCA in 7/21.  Her last PCI was a DES to the LAD in 06/2021.  Nuclear stress test in 11/2023 was low risk. She was kept on Clopidogrel  due to hx of multiple stents.  She is doing well with no current symptoms of angina.  She has been continued on Plavix  due to multiple stents.  She seems to be tolerating this well. - Continue Plavix  75 mg daily - Continue Lipitor  80 mg daily - Continue Metoprolol  tartrate 50 mg twice daily - Continue nitroglycerin  as needed - Follow-up 6 months Paroxysmal atrial fibrillation (HCC) She is maintaining sinus rhythm.  She is on Coumadin  therapy for anticoagulation which is managed in our office. - Continue Metoprolol  tartrate 50 mg twice daily - Continue Coumadin  as directed by Coumadin  clinic Essential hypertension Blood pressure above goal. Blood pressure readings have been variable.  Hypertensive medications were adjusted during her hospitalization for volume and syncope. - Increase Losartan  back to 50 mg daily - Continue Metoprolol  tartrate 50 mg twice daily - Monitor blood pressure at home and send readings in two weeks Pure hypercholesterolemia Hyperlipidemia managed with Lipitor  and  Zetia . Goal LDL is less than 70, ideally less than 55. - Continue Lipitor  80 mg daily - Continue Zetia  10 mg daily - Obtain fasting lipid panel today Syncope and collapse Recent syncope likely vasovagal due to dehydration and UTI. No further cardiovascular workup needed. Hypokalemia Potassium low in the [name].  Repeat BMET today is noted.      Dispo:  Return in about 6 months (around 02/03/2025) for Routine Follow Up, w/ Dr. Wonda.  Signed, Glendia Ferrier, PA-C

## 2024-08-05 ENCOUNTER — Ambulatory Visit: Attending: Physician Assistant | Admitting: Physician Assistant

## 2024-08-05 ENCOUNTER — Other Ambulatory Visit (HOSPITAL_COMMUNITY): Payer: Self-pay

## 2024-08-05 ENCOUNTER — Ambulatory Visit

## 2024-08-05 ENCOUNTER — Encounter: Payer: Self-pay | Admitting: Physician Assistant

## 2024-08-05 VITALS — BP 190/80 | HR 77 | Ht 60.0 in | Wt 120.2 lb

## 2024-08-05 DIAGNOSIS — E7800 Pure hypercholesterolemia, unspecified: Secondary | ICD-10-CM | POA: Diagnosis not present

## 2024-08-05 DIAGNOSIS — I480 Paroxysmal atrial fibrillation: Secondary | ICD-10-CM

## 2024-08-05 DIAGNOSIS — I2510 Atherosclerotic heart disease of native coronary artery without angina pectoris: Secondary | ICD-10-CM | POA: Diagnosis not present

## 2024-08-05 DIAGNOSIS — Z7901 Long term (current) use of anticoagulants: Secondary | ICD-10-CM

## 2024-08-05 DIAGNOSIS — I10 Essential (primary) hypertension: Secondary | ICD-10-CM

## 2024-08-05 DIAGNOSIS — E876 Hypokalemia: Secondary | ICD-10-CM

## 2024-08-05 DIAGNOSIS — R55 Syncope and collapse: Secondary | ICD-10-CM

## 2024-08-05 LAB — LIPID PANEL
Chol/HDL Ratio: 2.4 ratio (ref 0.0–4.4)
Cholesterol, Total: 122 mg/dL (ref 100–199)
HDL: 51 mg/dL (ref >39)
LDL Chol Calc (NIH): 49 mg/dL (ref 0–99)
Triglycerides: 126 mg/dL (ref 0–149)
VLDL Cholesterol Cal: 22 mg/dL (ref 5–40)

## 2024-08-05 LAB — BASIC METABOLIC PANEL WITH GFR
BUN/Creatinine Ratio: 19 (ref 12–28)
BUN: 16 mg/dL (ref 8–27)
CO2: 28 mmol/L (ref 20–29)
Calcium: 8.9 mg/dL (ref 8.7–10.3)
Chloride: 99 mmol/L (ref 96–106)
Creatinine, Ser: 0.85 mg/dL (ref 0.57–1.00)
Glucose: 187 mg/dL — ABNORMAL HIGH (ref 70–99)
Potassium: 4.3 mmol/L (ref 3.5–5.2)
Sodium: 140 mmol/L (ref 134–144)
eGFR: 68 mL/min/1.73 (ref >59)

## 2024-08-05 LAB — POCT INR: INR: 1.9 — AB (ref 2.0–3.0)

## 2024-08-05 MED ORDER — LOSARTAN POTASSIUM 50 MG PO TABS
50.0000 mg | ORAL_TABLET | Freq: Every day | ORAL | 3 refills | Status: AC
  Filled 2024-08-05: qty 90, 90d supply, fill #0

## 2024-08-05 NOTE — Assessment & Plan Note (Signed)
 She is maintaining sinus rhythm.  She is on Coumadin  therapy for anticoagulation which is managed in our office. - Continue Metoprolol  tartrate 50 mg twice daily - Continue Coumadin  as directed by Coumadin  clinic

## 2024-08-05 NOTE — Progress Notes (Signed)
 Description   INR 1.9; Take 7.5mg  (3 tablets) today, then continue taking warfarin 5mg  daily except for 7.5mg  on Mondays and Fridays. Recheck INR in 4 weeks. Clinic [phone]

## 2024-08-05 NOTE — Assessment & Plan Note (Signed)
 Blood pressure above goal. Blood pressure readings have been variable.  Hypertensive medications were adjusted during her hospitalization for volume and syncope. - Increase Losartan  back to 50 mg daily - Continue Metoprolol  tartrate 50 mg twice daily - Monitor blood pressure at home and send readings in two weeks

## 2024-08-05 NOTE — Assessment & Plan Note (Signed)
 Hyperlipidemia managed with Lipitor  and Zetia . Goal LDL is less than 70, ideally less than 55. - Continue Lipitor  80 mg daily - Continue Zetia  10 mg daily - Obtain fasting lipid panel today

## 2024-08-05 NOTE — Patient Instructions (Signed)
 Description   INR 1.9; Take 7.5mg  (3 tablets) today, then continue taking warfarin 5mg  daily except for 7.5mg  on Mondays and Fridays. Recheck INR in 4 weeks. Clinic [phone]

## 2024-08-05 NOTE — Patient Instructions (Signed)
 Medication Instructions:  Increase Losartan  50 mg daily  *If you need a refill on your cardiac medications before your next appointment, please call your pharmacy*  Lab Work: BMET, Lipid panel today  Testing/Procedures: NONE ordered at this time of appointment   Follow-Up: At [facility], you and your health needs are our priority.  As part of our continuing mission to provide you with exceptional heart care, our providers are all part of one team.  This team includes your primary Cardiologist (physician) and Advanced Practice Providers or APPs (Physician Assistants and Nurse Practitioners) who all work together to provide you with the care you need, when you need it.  Your next appointment:   6 month(s)  Provider:   Ozell Fell, MD    We recommend signing up for the patient portal called MyChart.  Sign up information is provided on this After Visit Summary.  MyChart is used to connect with patients for Virtual Visits (Telemedicine).  Patients are able to view lab/test results, encounter notes, upcoming appointments, etc.  Non-urgent messages can be sent to your provider as well.   To learn more about what you can do with MyChart, go to [email or web address].   Other Instructions Monitor Blood pressure for 2 weeks and send results in mychart. Take BP 2 hours after you've taken BP medications.

## 2024-08-06 ENCOUNTER — Ambulatory Visit: Payer: Self-pay | Admitting: Physician Assistant

## 2024-08-07 ENCOUNTER — Other Ambulatory Visit (HOSPITAL_COMMUNITY): Payer: Self-pay

## 2024-08-07 MED ORDER — LANTUS SOLOSTAR 100 UNIT/ML ~~LOC~~ SOPN
10.0000 [IU] | PEN_INJECTOR | Freq: Every day | SUBCUTANEOUS | 3 refills | Status: AC
  Filled 2024-08-07: qty 9, 90d supply, fill #0
  Filled 2024-08-15: qty 15, 150d supply, fill #0

## 2024-08-13 ENCOUNTER — Ambulatory Visit

## 2024-08-14 ENCOUNTER — Other Ambulatory Visit: Payer: Self-pay

## 2024-08-14 ENCOUNTER — Encounter (HOSPITAL_COMMUNITY): Payer: Self-pay

## 2024-08-14 ENCOUNTER — Emergency Department (HOSPITAL_COMMUNITY)

## 2024-08-14 ENCOUNTER — Emergency Department (HOSPITAL_COMMUNITY)
Admission: EM | Admit: 2024-08-14 | Discharge: 2024-08-14 | Disposition: A | Discharge status: Home / Self Care | Attending: Emergency Medicine | Admitting: Emergency Medicine

## 2024-08-14 DIAGNOSIS — W01198A Fall on same level from slipping, tripping and stumbling with subsequent striking against other object, initial encounter: Secondary | ICD-10-CM | POA: Insufficient documentation

## 2024-08-14 DIAGNOSIS — Z7901 Long term (current) use of anticoagulants: Secondary | ICD-10-CM | POA: Insufficient documentation

## 2024-08-14 DIAGNOSIS — Z79899 Other long term (current) drug therapy: Secondary | ICD-10-CM | POA: Insufficient documentation

## 2024-08-14 DIAGNOSIS — I4891 Unspecified atrial fibrillation: Secondary | ICD-10-CM | POA: Insufficient documentation

## 2024-08-14 DIAGNOSIS — S0003XA Contusion of scalp, initial encounter: Secondary | ICD-10-CM | POA: Insufficient documentation

## 2024-08-14 DIAGNOSIS — I2510 Atherosclerotic heart disease of native coronary artery without angina pectoris: Secondary | ICD-10-CM | POA: Insufficient documentation

## 2024-08-14 DIAGNOSIS — W19XXXA Unspecified fall, initial encounter: Secondary | ICD-10-CM

## 2024-08-14 DIAGNOSIS — E119 Type 2 diabetes mellitus without complications: Secondary | ICD-10-CM | POA: Insufficient documentation

## 2024-08-14 LAB — CBC WITH DIFFERENTIAL/PLATELET
Abs Immature Granulocytes: 0.01 K/uL (ref 0.00–0.07)
Basophils Absolute: 0.1 K/uL (ref 0.0–0.1)
Basophils Relative: 1 %
Eosinophils Absolute: 0.2 K/uL (ref 0.0–0.5)
Eosinophils Relative: 4 %
HCT: 41.2 % (ref 36.0–46.0)
Hemoglobin: 13.1 g/dL (ref 12.0–15.0)
Immature Granulocytes: 0 %
Lymphocytes Relative: 37 %
Lymphs Abs: 2.5 K/uL (ref 0.7–4.0)
MCH: 30.7 pg (ref 26.0–34.0)
MCHC: 31.8 g/dL (ref 30.0–36.0)
MCV: 96.5 fL (ref 80.0–100.0)
Monocytes Absolute: 0.8 K/uL (ref 0.1–1.0)
Monocytes Relative: 11 %
Neutro Abs: 3.2 K/uL (ref 1.7–7.7)
Neutrophils Relative %: 47 %
Platelets: 205 K/uL (ref 150–400)
RBC: 4.27 MIL/uL (ref 3.87–5.11)
RDW: 13.3 % (ref 11.5–15.5)
WBC: 6.7 K/uL (ref 4.0–10.5)
nRBC: 0 % (ref 0.0–0.2)

## 2024-08-14 LAB — COMPREHENSIVE METABOLIC PANEL WITH GFR
ALT: 18 U/L (ref 0–44)
AST: 18 U/L (ref 15–41)
Albumin: 3.5 g/dL (ref 3.5–5.0)
Alkaline Phosphatase: 67 U/L (ref 38–126)
Anion gap: 7 (ref 5–15)
BUN: 18 mg/dL (ref 8–23)
CO2: 32 mmol/L (ref 22–32)
Calcium: 8 mg/dL — ABNORMAL LOW (ref 8.9–10.3)
Chloride: 97 mmol/L — ABNORMAL LOW (ref 98–111)
Creatinine, Ser: 1.29 mg/dL — ABNORMAL HIGH (ref 0.44–1.00)
GFR, Estimated: 41 mL/min — ABNORMAL LOW (ref >60)
Glucose, Bld: 194 mg/dL — ABNORMAL HIGH (ref 70–99)
Potassium: 4.1 mmol/L (ref 3.5–5.1)
Sodium: 136 mmol/L (ref 135–145)
Total Bilirubin: 0.5 mg/dL (ref 0.0–1.2)
Total Protein: 6.3 g/dL — ABNORMAL LOW (ref 6.5–8.1)

## 2024-08-14 LAB — PROTIME-INR
INR: 2.6 — ABNORMAL HIGH (ref 0.8–1.2)
Prothrombin Time: 28.7 s — ABNORMAL HIGH (ref 11.4–15.2)

## 2024-08-14 MED ORDER — ONDANSETRON 4 MG PO TBDP
4.0000 mg | ORAL_TABLET | Freq: Once | ORAL | Status: AC
  Administered 2024-08-14: 4 mg via ORAL
  Filled 2024-08-14: qty 1

## 2024-08-14 MED ORDER — ONDANSETRON HCL 4 MG/2ML IJ SOLN: 4.0000 mg | Freq: Once | INTRAMUSCULAR | Status: DC

## 2024-08-14 MED ORDER — FENTANYL CITRATE (PF) 50 MCG/ML IJ SOSY: 50.0000 ug | PREFILLED_SYRINGE | Freq: Once | INTRAMUSCULAR | Status: DC

## 2024-08-14 MED ORDER — MORPHINE SULFATE (PF) 4 MG/ML IV SOLN
4.0000 mg | Freq: Once | INTRAVENOUS | Status: AC
  Administered 2024-08-14: 4 mg via INTRAMUSCULAR
  Filled 2024-08-14: qty 1

## 2024-08-14 MED ORDER — TRAMADOL HCL 50 MG PO TABS
50.0000 mg | ORAL_TABLET | Freq: Four times a day (QID) | ORAL | 0 refills | Status: DC | PRN
  Filled 2024-08-14: qty 15, 4d supply, fill #0

## 2024-08-14 NOTE — Discharge Instructions (Signed)
 As we discussed your CT scan showed scalp hematoma.  Please apply ice to help with that hematoma  You can take Tylenol  as needed for pain and tramadol  for severe pain  Hold Coumadin  tonight but you can continue tomorrow.  Please follow-up with your doctor  Return to ER if you have severe headache or vomiting or uncontrolled bleeding.

## 2024-08-14 NOTE — ED Triage Notes (Signed)
 PT BIB [facility] with a c/o a FOT.PT was standing on a step stool and fell off of the 2nd step, hit head on fnloor without a LOC.PT has 2 hematomas, 1 in the back right of her head and the other on her temple, bleeding is controlled.PT is reporting a pain scale of 10 in the back of her head and shoulder. PT is on two blood thinners.

## 2024-08-14 NOTE — Progress Notes (Signed)
°   08/14/24 2024  Spiritual Encounters  Type of Visit Initial  Care provided to: Patient  Referral source Trauma page  Reason for visit Trauma  OnCall Visit Yes   Responded to trauma page for FOT. Provided spiritual care visa companionate presence. Checked waiting area for patient's daughter who has not yet arrived.

## 2024-08-14 NOTE — ED Provider Notes (Signed)
 [place] [facility] AT [facility] Provider Note   CSN: [id number] Arrival date & time: 08/14/24  1943     Patient presents with: No chief complaint on file.   NAMYA VOGES is a 83 y.o. female history of diabetes, CAD status post stent, A-fib on Coumadin , here presenting with fall.  Patient states that she was packing up her stuff to ready to move in with her daughter tomorrow.  She states that she lost her balance and fell backwards and hit her head.  Patient also fell on the left shoulder.  She has chronic left shoulder problem.  She states that she has worsening left shoulder pain and was noted to have a scalp hematoma.  Level 2 trauma was activated since she is on Coumadin    The history is provided by the patient.       Prior to Admission medications  Medication Sig Start Date End Date Taking? Authorizing Provider  Accu-Chek Softclix Lancets lancets Use to check blood sugar 2 (two) times daily. 03/17/24     Accu-Chek Softclix Lancets lancets Use to monitor Blood Sugars daily. 03/18/24     acetaminophen  (TYLENOL ) 325 MG tablet Take 650 mg by mouth every 6 (six) hours as needed for moderate pain.    [provider]  Ascorbic Acid  (VITAMIN C ) 500 MG CAPS Take 500 mg by mouth in the morning and at bedtime.    [provider]  atorvastatin  (LIPITOR ) 80 MG tablet Take 1 tablet (80 mg total) by mouth at bedtime. 03/02/24   Lelon Hamilton T, PA-C  atorvastatin  (LIPITOR ) 80 MG tablet Take 80 mg by mouth daily. 02/19/24   [provider]  Blood Glucose Monitoring Suppl (ACCU-CHEK GUIDE) w/Device KIT Use to monitor Blood Sugars daily. 03/18/24     Cinnamon 500 MG capsule Take 2 capsules twice a day by oral route as directed.    [provider]  clopidogrel  (PLAVIX ) 75 MG tablet Take 1 tablet (75 mg total) by mouth in the morning to prevent blood clots. 03/16/24     clopidogrel  (PLAVIX ) 75 MG tablet Take 75 mg by mouth daily. 02/19/24    [provider]  dicyclomine  (BENTYL ) 10 MG capsule Take 1 capsule (10 mg) by mouth every 8 hours (up to 3 times per day) for abdominal pain/spasms as needed. 03/09/24     dicyclomine  (BENTYL ) 10 MG capsule Take 10 mg by mouth 3 (three) times daily. 02/19/24   [provider]  ezetimibe  (ZETIA ) 10 MG tablet Take 1 tablet (10 mg total) by mouth daily for cholesterol. 03/16/24     ezetimibe  (ZETIA ) 10 MG tablet Take 10 mg by mouth daily. 02/19/24   [provider]  famotidine  (PEPCID ) 20 MG tablet Take 1 tablet (20 mg total) by mouth daily as needed for severe indigestion and heartburn 03/09/24     famotidine  (PEPCID ) 20 MG tablet Take 20 mg by mouth 2 (two) times daily.    [provider]  gabapentin  (NEURONTIN ) 300 MG capsule Take 1 capsule by oral route every 8 hours (up to 3 times daily) for nerve/chronic pain. 03/09/24     gabapentin  (NEURONTIN ) 300 MG capsule Take 300 mg by mouth 3 (three) times daily. 02/19/24   [provider]  glucose blood (ACCU-CHEK GUIDE TEST) test strip Use to monitor Blood Sugars daily. 03/18/24     glucose blood (ASSURE PRISM  MULTI TEST) test strip Use strips as directed to check blood sugar twice daily 03/17/24  insulin  aspart (NOVOLOG ) 100 UNIT/ML FlexPen Before each meal 3 times a day, 140-199 - 2 units, 200-250 - 4 units, 251-299 - 6 units,  300-349 - 8 units,  350 or above 10 units. 02/19/24   Singh, Prashant K, MD  Insulin  Aspart FlexPen (NOVOLOG ) 100 UNIT/ML  02/19/24   [provider]  insulin  glargine (LANTUS  SOLOSTAR) 100 UNIT/ML Solostar Pen Inject 10 Units into the skin daily for diabetes. 04/28/24     insulin  glargine (LANTUS  SOLOSTAR) 100 UNIT/ML Solostar Pen Inject 10 Units into the skin daily for diabetes. 08/07/24     insulin  glargine (LANTUS ) 100 UNIT/ML injection Inject 10 Units into the skin daily.    [provider]  Insulin  Pen Needle (PEN NEEDLES) 32G X 4 MM MISC Use as directed to administer  insulin  daily. 03/16/24     losartan  (COZAAR ) 50 MG tablet Take 1 tablet (50 mg total) by mouth daily. 08/05/24   Lelon Hamilton T, PA-C  metoprolol  tartrate (LOPRESSOR ) 100 MG tablet Take 1 tablet (100 mg total) by mouth 2 (two) times daily. Patient taking differently: Take 100 mg by mouth 2 (two) times daily. Per patient taking 1/2 tablet (50) mg twice daily 03/02/24   Lelon Hamilton T, PA-C  [Paused] metoprolol  tartrate (LOPRESSOR ) 50 MG tablet Take 50 mg by mouth 2 (two) times daily. Wait to take this until your doctor or other care provider tells you to start again. 02/19/24   [provider]  montelukast  (SINGULAIR ) 10 MG tablet Take 1 tablet (10 mg total) by mouth at bedtime for allergies. 03/09/24     montelukast  (SINGULAIR ) 10 MG tablet Take 10 mg by mouth daily. 02/19/24   [provider]  [Paused] nitrofurantoin  (MACRODANTIN ) 50 MG capsule Take 1 capsule (50 mg total) by mouth daily for UTI prevention. Wait to take this until your doctor or other care provider tells you to start again. 03/16/24     nitroGLYCERIN  (NITROSTAT ) 0.4 MG SL tablet Dissolve 1 tablet under your tongue at the first sign of heart attack/chest pain; no more than 3 tablets within a 15 minute period. 03/16/24     nitroGLYCERIN  (NITROSTAT ) 0.4 MG SL tablet Place 0.4 mg under the tongue every 5 (five) minutes as needed for chest pain. 02/19/24   [provider]  Omega-3 Fatty Acids (FISH OIL) 1000 MG CAPS Take 1,000 mg by mouth in the morning and at bedtime.    [provider]  ondansetron  (ZOFRAN -ODT) 4 MG disintegrating tablet Take 4 mg by mouth every 8 (eight) hours as needed for nausea or vomiting. 02/19/24   [provider]  pantoprazole  (PROTONIX ) 40 MG tablet Take 1 tablet (40 mg total) by mouth 2 (two) times daily for indigestion and heart burn. 03/09/24     pantoprazole  (PROTONIX ) 40 MG tablet Take 40 mg by mouth 2 (two) times daily. 02/19/24   [provider]  tiZANidine   (ZANAFLEX ) 4 MG tablet Take 1 tablet by mouth twice daily as needed for chronic shoulder/neck pain. 03/16/24     tiZANidine  (ZANAFLEX ) 4 MG tablet Take 4 mg by mouth 3 (three) times daily.    [provider]  venlafaxine  XR (EFFEXOR -XR) 37.5 MG 24 hr capsule Take 1 capsule (37.5 mg total) by mouth daily for anxiety and depression. 03/09/24     venlafaxine  XR (EFFEXOR -XR) 37.5 MG 24 hr capsule Take 37.5 mg by mouth daily. 02/19/24   [provider]  warfarin (COUMADIN ) 2.5 MG tablet Take 2 tablets daily except 1&1/2  tablets on Thursdays or as directed by anticoagulation clinic 06/01/24   Cooper, Michael, MD  warfarin (COUMADIN ) 2.5 MG tablet Take 2-3 tablets (5-7.5 mg total) by mouth See admin instructions. 11/17 - 11/11: While on levofloxacin , take warfarin 5mg  ( 2 tabets) by mouth daily 11/12 and on: take 3 tablets (7.5 mg) by mouth every Mon and Fri and  take two tablets (5 mg) by mouth all other days OR per anticoagulation clinic 07/20/24   Eben Reyes BROCKS, MD    Allergies: Sulfa  antibiotics, Codeine, Loop diuretics (sulfonamide), Niacin  and related, Prednisone, Rocephin  [ceftriaxone ], Sulfa  antibiotics, Codeine, Macrobid  [nitrofurantoin ], Niacin , Prednisone, Rocephin  [ceftriaxone ], Sulfonamide derivatives, Tetracycline, and Tetracyclines & related    Review of Systems  Neurological:  Positive for headaches.  All other systems reviewed and are negative.   Updated Vital Signs BP 120/66   Pulse 70   Resp 20   Ht 5' (1.524 m)   Wt 54.5 kg   SpO2 92%   BMI 23.47 kg/m   Physical Exam Vitals and nursing note reviewed.  Constitutional:      Comments: Uncomfortable  HENT:     Head:     Comments: Patient has a right posterior scalp hematoma but no obvious laceration.    Nose: Nose normal.     Mouth/Throat:     Mouth: Mucous membranes are moist.  Eyes:     Extraocular Movements: Extraocular movements intact.     Pupils: Pupils are equal, round, and reactive to light.   Cardiovascular:     Rate and Rhythm: Normal rate and regular rhythm.     Pulses: Normal pulses.     Heart sounds: Normal heart sounds.  Pulmonary:     Effort: Pulmonary effort is normal.     Breath sounds: Normal breath sounds.  Abdominal:     General: Abdomen is flat.     Palpations: Abdomen is soft.  Musculoskeletal:     Cervical back: Normal range of motion and neck supple.     Comments: Decreased range of motion of the left shoulder with no obvious deformity.  No obvious spinal tenderness.  Normal range of motion bilateral hips and no obvious extremity trauma  Skin:    General: Skin is warm.     Capillary Refill: Capillary refill takes less than 2 seconds.  Neurological:     General: No focal deficit present.     Mental Status: She is oriented to person, place, and time.  Psychiatric:        Mood and Affect: Mood normal.        Behavior: Behavior normal.     (all labs ordered are listed, but only abnormal results are displayed) Labs Reviewed  COMPREHENSIVE METABOLIC PANEL WITH GFR - Abnormal; Notable for the following components:      Result Value   Chloride 97 (*)    Glucose, Bld 194 (*)    Creatinine, Ser 1.29 (*)    Calcium  8.0 (*)    Total Protein 6.3 (*)    GFR, Estimated 41 (*)    All other components within normal limits  PROTIME-INR - Abnormal; Notable for the following components:   Prothrombin Time 28.7 (*)    INR 2.6 (*)    All other components within normal limits  CBC WITH DIFFERENTIAL/PLATELET    EKG: None  Radiology: CT Cervical Spine Wo Contrast Result Date: 08/14/2024 EXAM: CT CERVICAL SPINE WITHOUT CONTRAST 08/14/2024 08:52:48 PM TECHNIQUE: CT of the cervical spine was performed without the administration of  intravenous contrast. Multiplanar reformatted images are provided for review. Automated exposure control, iterative reconstruction, and/or weight based adjustment of the mA/kV was utilized to reduce the radiation dose to as low as  reasonably achievable. COMPARISON: None available. CLINICAL HISTORY: Neck trauma (Age >= 65y) FINDINGS: CERVICAL SPINE: BONES AND ALIGNMENT: Posterior cervical fusion hardware at C2-C4 and C6-T1. Anterior cervical fusion hardware at C6-T1. Osseous fusion at C4-C5. No acute fracture or traumatic malalignment. DEGENERATIVE CHANGES: No significant degenerative changes. SOFT TISSUES: No prevertebral soft tissue swelling. LUNGS: Fiducial marker in the left lung apex with adjacent stable 16 x 14 mm nodular opacity (image 78). IMPRESSION: 1. No acute abnormality of the cervical spine. 2. Stable postsurgical changes, as above. 3. Stable left apical nodular opacity with adjacent fiducial marker. Electronically signed by: Pinkie Pebbles MD 08/14/2024 08:58 PM EST RP Workstation: [id number]   CT Head Wo Contrast Result Date: 08/14/2024 EXAM: CT HEAD WITHOUT 08/14/2024 08:52:48 PM TECHNIQUE: CT of the head was performed without the administration of intravenous contrast. Automated exposure control, iterative reconstruction, and/or weight based adjustment of the mA/kV was utilized to reduce the radiation dose to as low as reasonably achievable. COMPARISON: 07/19/2024 CLINICAL HISTORY: Head trauma, intracranial venous injury suspected. FINDINGS: BRAIN AND VENTRICLES: No acute intracranial hemorrhage. No mass effect or midline shift. No extra-axial fluid collection. No evidence of acute infarct. No hydrocephalus. Subcortical and periventricular small vessel ischemic changes. ORBITS: No acute abnormality. SINUSES AND MASTOIDS: No acute abnormality. SOFT TISSUES AND SKULL: No acute skull fracture. Moderate extracranial hematoma overlying the right parietal bone (image 25). IMPRESSION: 1. No acute intracranial abnormality. 2. Moderate right parietal scalp hematoma. Electronically signed by: Pinkie Pebbles MD 08/14/2024 08:55 PM EST RP Workstation: [id number]   DG Shoulder 1 View Left Result Date: 08/14/2024 EXAM: 1  VIEW(S) XRAY OF THE LEFT SHOULDER 08/14/2024 07:56:00 PM COMPARISON: 07/05/2023 CLINICAL HISTORY: fall FINDINGS: BONES AND JOINTS: Moderate to severe glenohumeral joint degenerative changes with joint space narrowing and osteophyte formation. Mild acromioclavicular joint degenerative changes. No acute fracture. No malalignment. Partial visualization of cervical spinal fusion hardware. SOFT TISSUES: Surgical clips overlying mediastinum. Atherosclerotic calcifications of the aorta. Visualized lung is unremarkable. IMPRESSION: 1. No evidence of acute traumatic injury. 2. 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 3. Mild acromioclavicular osteoarthritis. Electronically signed by: Oneil Devonshire MD 08/14/2024 08:03 PM EST RP Workstation: [id number]   DG Pelvis Portable Result Date: 08/14/2024 EXAM: 1 or 2 VIEW(S) XRAY OF THE PELVIS 08/14/2024 07:56:00 PM COMPARISON: 07/19/2024 CLINICAL HISTORY: fall FINDINGS: BONES AND JOINTS: No acute fracture. No malalignment. Degenerative changes of the visualized lower lumbar spine. Mild bilateral hip degenerative changes. SOFT TISSUES: Surgical clips in midline pelvis. IMPRESSION: 1. No evidence of acute traumatic injury. 2. Mild bilateral hip degenerative changes. Electronically signed by: Oneil Devonshire MD 08/14/2024 08:02 PM EST RP Workstation: [id number]   DG Chest Port 1 View Result Date: 08/14/2024 EXAM: 1 VIEW(S) XRAY OF THE CHEST 08/14/2024 07:56:00 PM COMPARISON: 07/19/2024 CLINICAL HISTORY: fall FINDINGS: LUNGS AND PLEURA: Fiducial marker overlying left upper lung zone. No focal pulmonary opacity. No pleural effusion. No pneumothorax. HEART AND MEDIASTINUM: Atherosclerotic plaque. No acute abnormality of the cardiac and mediastinal silhouettes. BONES AND SOFT TISSUES: Right upper quadrant surgical clips. Cervical surgical hardware. No acute osseous abnormality. IMPRESSION: 1. No acute cardiopulmonary process. Electronically signed by: Greig Pique MD 08/14/2024  08:00 PM EST RP Workstation: [id number]     Procedures   Medications Ordered in the ED  morphine  (PF) 4 MG/ML injection 4 mg (4 mg  Intramuscular Given 08/14/24 2132)  ondansetron  (ZOFRAN -ODT) disintegrating tablet 4 mg (4 mg Oral Given 08/14/24 2132)                                    Medical Decision Making EVELYNA FOLKER is a 84 y.o. female here presenting with fall.  Patient had a mechanical fall and hit her head.  Level 2 trauma was activated.  Plan to get CT head and cervical spine to rule out cervical fracture or intracranial bleeding or scalp hematoma.  Plan to get labs and x-rays as well.  9:45 PM I reviewed patient's labs and they were unremarkable.  INR is 2.6.  CT head and cervical spine unremarkable.  At this point patient is stable for discharge.   Problems Addressed: Fall, initial encounter: acute illness or injury Hematoma of scalp, initial encounter: acute illness or injury  Amount and/or Complexity of Data Reviewed Labs: ordered. Decision-making details documented in ED Course. Radiology: ordered and independent interpretation performed. Decision-making details documented in ED Course. ECG/medicine tests: ordered.  Risk Prescription drug management.     Final diagnoses:  None    ED Discharge Orders     None          Patt Alm Macho, MD 08/14/24 2146

## 2024-08-14 NOTE — Progress Notes (Signed)
 Orthopedic Tech Progress Note Patient Details:  [name] [name] February 16, 1941 [id number]  Patient ID: [name] [name], female   DOB: 11/20/1940, 83 y.o.   MRN: [id number] Level 2 FOT. Not needed. [name] [name] 08/14/2024, 7:57 PM

## 2024-08-15 ENCOUNTER — Other Ambulatory Visit (HOSPITAL_BASED_OUTPATIENT_CLINIC_OR_DEPARTMENT_OTHER): Payer: Self-pay

## 2024-08-17 ENCOUNTER — Other Ambulatory Visit (HOSPITAL_COMMUNITY): Payer: Self-pay

## 2024-08-21 ENCOUNTER — Encounter (HOSPITAL_COMMUNITY): Payer: Self-pay | Admitting: Family Medicine

## 2024-08-21 ENCOUNTER — Emergency Department (HOSPITAL_BASED_OUTPATIENT_CLINIC_OR_DEPARTMENT_OTHER)

## 2024-08-21 ENCOUNTER — Observation Stay (HOSPITAL_BASED_OUTPATIENT_CLINIC_OR_DEPARTMENT_OTHER)
Admission: EM | Admit: 2024-08-21 | Discharge: 2024-08-29 | Disposition: A | Discharge status: Skilled Nursing Facility | Attending: Internal Medicine | Admitting: Internal Medicine

## 2024-08-21 ENCOUNTER — Other Ambulatory Visit: Payer: Self-pay

## 2024-08-21 DIAGNOSIS — E871 Hypo-osmolality and hyponatremia: Secondary | ICD-10-CM | POA: Insufficient documentation

## 2024-08-21 DIAGNOSIS — E119 Type 2 diabetes mellitus without complications: Secondary | ICD-10-CM

## 2024-08-21 DIAGNOSIS — Y9289 Other specified places as the place of occurrence of the external cause: Secondary | ICD-10-CM | POA: Diagnosis not present

## 2024-08-21 DIAGNOSIS — Z7901 Long term (current) use of anticoagulants: Secondary | ICD-10-CM | POA: Insufficient documentation

## 2024-08-21 DIAGNOSIS — R0681 Apnea, not elsewhere classified: Secondary | ICD-10-CM | POA: Insufficient documentation

## 2024-08-21 DIAGNOSIS — R4182 Altered mental status, unspecified: Secondary | ICD-10-CM | POA: Diagnosis present

## 2024-08-21 DIAGNOSIS — W19XXXA Unspecified fall, initial encounter: Secondary | ICD-10-CM | POA: Diagnosis not present

## 2024-08-21 DIAGNOSIS — G9341 Metabolic encephalopathy: Secondary | ICD-10-CM | POA: Diagnosis not present

## 2024-08-21 DIAGNOSIS — N179 Acute kidney failure, unspecified: Secondary | ICD-10-CM | POA: Diagnosis present

## 2024-08-21 DIAGNOSIS — Z86718 Personal history of other venous thrombosis and embolism: Secondary | ICD-10-CM | POA: Insufficient documentation

## 2024-08-21 DIAGNOSIS — F418 Other specified anxiety disorders: Secondary | ICD-10-CM | POA: Diagnosis present

## 2024-08-21 DIAGNOSIS — I2510 Atherosclerotic heart disease of native coronary artery without angina pectoris: Secondary | ICD-10-CM | POA: Diagnosis not present

## 2024-08-21 DIAGNOSIS — Z79899 Other long term (current) drug therapy: Secondary | ICD-10-CM | POA: Diagnosis not present

## 2024-08-21 DIAGNOSIS — Z951 Presence of aortocoronary bypass graft: Secondary | ICD-10-CM | POA: Insufficient documentation

## 2024-08-21 DIAGNOSIS — Y92009 Unspecified place in unspecified non-institutional (private) residence as the place of occurrence of the external cause: Secondary | ICD-10-CM

## 2024-08-21 DIAGNOSIS — I5032 Chronic diastolic (congestive) heart failure: Secondary | ICD-10-CM | POA: Diagnosis not present

## 2024-08-21 DIAGNOSIS — D6859 Other primary thrombophilia: Principal | ICD-10-CM | POA: Insufficient documentation

## 2024-08-21 DIAGNOSIS — R791 Abnormal coagulation profile: Secondary | ICD-10-CM | POA: Diagnosis present

## 2024-08-21 DIAGNOSIS — Z8673 Personal history of transient ischemic attack (TIA), and cerebral infarction without residual deficits: Secondary | ICD-10-CM | POA: Diagnosis not present

## 2024-08-21 DIAGNOSIS — Z7902 Long term (current) use of antithrombotics/antiplatelets: Secondary | ICD-10-CM | POA: Insufficient documentation

## 2024-08-21 DIAGNOSIS — S3011XA Contusion of abdominal wall, initial encounter: Secondary | ICD-10-CM | POA: Diagnosis present

## 2024-08-21 DIAGNOSIS — Z794 Long term (current) use of insulin: Secondary | ICD-10-CM | POA: Diagnosis not present

## 2024-08-21 DIAGNOSIS — I480 Paroxysmal atrial fibrillation: Secondary | ICD-10-CM | POA: Diagnosis not present

## 2024-08-21 DIAGNOSIS — S0003XA Contusion of scalp, initial encounter: Secondary | ICD-10-CM | POA: Diagnosis present

## 2024-08-21 DIAGNOSIS — I10 Essential (primary) hypertension: Secondary | ICD-10-CM | POA: Diagnosis present

## 2024-08-21 DIAGNOSIS — R0902 Hypoxemia: Secondary | ICD-10-CM | POA: Diagnosis not present

## 2024-08-21 DIAGNOSIS — S0003XD Contusion of scalp, subsequent encounter: Secondary | ICD-10-CM | POA: Diagnosis not present

## 2024-08-21 DIAGNOSIS — G9340 Encephalopathy, unspecified: Secondary | ICD-10-CM | POA: Diagnosis not present

## 2024-08-21 DIAGNOSIS — R410 Disorientation, unspecified: Secondary | ICD-10-CM

## 2024-08-21 DIAGNOSIS — I110 Hypertensive heart disease with heart failure: Secondary | ICD-10-CM | POA: Diagnosis not present

## 2024-08-21 LAB — CBC WITH DIFFERENTIAL/PLATELET
Abs Immature Granulocytes: 0.02 K/uL (ref 0.00–0.07)
Basophils Absolute: 0.1 K/uL (ref 0.0–0.1)
Basophils Relative: 1 %
Eosinophils Absolute: 0.3 K/uL (ref 0.0–0.5)
Eosinophils Relative: 4 %
HCT: 33.8 % — ABNORMAL LOW (ref 36.0–46.0)
Hemoglobin: 11.4 g/dL — ABNORMAL LOW (ref 12.0–15.0)
Immature Granulocytes: 0 %
Lymphocytes Relative: 28 %
Lymphs Abs: 2.1 K/uL (ref 0.7–4.0)
MCH: 31.5 pg (ref 26.0–34.0)
MCHC: 33.7 g/dL (ref 30.0–36.0)
MCV: 93.4 fL (ref 80.0–100.0)
Monocytes Absolute: 0.9 K/uL (ref 0.1–1.0)
Monocytes Relative: 12 %
Neutro Abs: 4.1 K/uL (ref 1.7–7.7)
Neutrophils Relative %: 55 %
Platelets: 253 K/uL (ref 150–400)
RBC: 3.62 MIL/uL — ABNORMAL LOW (ref 3.87–5.11)
RDW: 13.5 % (ref 11.5–15.5)
WBC: 7.5 K/uL (ref 4.0–10.5)
nRBC: 0 % (ref 0.0–0.2)

## 2024-08-21 LAB — COMPREHENSIVE METABOLIC PANEL WITH GFR
ALT: 21 U/L (ref 0–44)
AST: 24 U/L (ref 15–41)
Albumin: 3.7 g/dL (ref 3.5–5.0)
Alkaline Phosphatase: 97 U/L (ref 38–126)
Anion gap: 8 (ref 5–15)
BUN: 18 mg/dL (ref 8–23)
CO2: 31 mmol/L (ref 22–32)
Calcium: 8.9 mg/dL (ref 8.9–10.3)
Chloride: 93 mmol/L — ABNORMAL LOW (ref 98–111)
Creatinine, Ser: 1.32 mg/dL — ABNORMAL HIGH (ref 0.44–1.00)
GFR, Estimated: 40 mL/min — ABNORMAL LOW
Glucose, Bld: 168 mg/dL — ABNORMAL HIGH (ref 70–99)
Potassium: 3.8 mmol/L (ref 3.5–5.1)
Sodium: 131 mmol/L — ABNORMAL LOW (ref 135–145)
Total Bilirubin: 0.5 mg/dL (ref 0.0–1.2)
Total Protein: 6.5 g/dL (ref 6.5–8.1)

## 2024-08-21 LAB — CBG MONITORING, ED: Glucose-Capillary: 158 mg/dL — ABNORMAL HIGH (ref 70–99)

## 2024-08-21 LAB — URINALYSIS, ROUTINE W REFLEX MICROSCOPIC
Bilirubin Urine: NEGATIVE
Glucose, UA: NEGATIVE mg/dL
Hgb urine dipstick: NEGATIVE
Ketones, ur: NEGATIVE mg/dL
Leukocytes,Ua: NEGATIVE
Nitrite: NEGATIVE
Protein, ur: NEGATIVE mg/dL
Specific Gravity, Urine: 1.014 (ref 1.005–1.030)
pH: 5.5 (ref 5.0–8.0)

## 2024-08-21 LAB — GLUCOSE, CAPILLARY: Glucose-Capillary: 220 mg/dL — ABNORMAL HIGH (ref 70–99)

## 2024-08-21 LAB — PROTIME-INR
INR: 5.8 (ref 0.8–1.2)
Prothrombin Time: 54.5 s — ABNORMAL HIGH (ref 11.4–15.2)

## 2024-08-21 MED ORDER — ONDANSETRON HCL 4 MG PO TABS: 4.0000 mg | ORAL_TABLET | Freq: Four times a day (QID) | ORAL | Status: DC | PRN

## 2024-08-21 MED ORDER — VENLAFAXINE HCL ER 37.5 MG PO CP24
37.5000 mg | ORAL_CAPSULE | Freq: Every day | ORAL | Status: DC
  Administered 2024-08-22 – 2024-08-29 (×8): 37.5 mg via ORAL
  Filled 2024-08-21 (×8): qty 1

## 2024-08-21 MED ORDER — PANTOPRAZOLE SODIUM 40 MG PO TBEC
40.0000 mg | DELAYED_RELEASE_TABLET | Freq: Two times a day (BID) | ORAL | Status: DC
  Administered 2024-08-21 – 2024-08-29 (×16): 40 mg via ORAL
  Filled 2024-08-21 (×16): qty 1

## 2024-08-21 MED ORDER — SENNA 8.6 MG PO TABS: 1.0000 | ORAL_TABLET | Freq: Every day | ORAL | Status: DC | PRN

## 2024-08-21 MED ORDER — SODIUM CHLORIDE 0.9 % IV SOLN: INTRAVENOUS | Status: AC

## 2024-08-21 MED ORDER — ACETAMINOPHEN 650 MG RE SUPP: 650.0000 mg | Freq: Four times a day (QID) | RECTAL | Status: DC | PRN

## 2024-08-21 MED ORDER — GABAPENTIN 100 MG PO CAPS
200.0000 mg | ORAL_CAPSULE | Freq: Two times a day (BID) | ORAL | Status: DC
  Administered 2024-08-21 – 2024-08-29 (×16): 200 mg via ORAL
  Filled 2024-08-21 (×16): qty 2

## 2024-08-21 MED ORDER — SODIUM CHLORIDE 0.9 % IV BOLUS
500.0000 mL | Freq: Once | INTRAVENOUS | Status: AC
  Administered 2024-08-21: 500 mL via INTRAVENOUS

## 2024-08-21 MED ORDER — METOPROLOL TARTRATE 50 MG PO TABS
50.0000 mg | ORAL_TABLET | Freq: Two times a day (BID) | ORAL | Status: DC
  Administered 2024-08-22 – 2024-08-29 (×15): 50 mg via ORAL
  Filled 2024-08-21 (×15): qty 1

## 2024-08-21 MED ORDER — INSULIN ASPART 100 UNIT/ML IJ SOLN
0.0000 [IU] | Freq: Three times a day (TID) | INTRAMUSCULAR | Status: DC
  Administered 2024-08-22 (×2): 1 [IU] via SUBCUTANEOUS
  Administered 2024-08-23 – 2024-08-24 (×2): 2 [IU] via SUBCUTANEOUS
  Administered 2024-08-24: 1 [IU] via SUBCUTANEOUS
  Administered 2024-08-24 – 2024-08-25 (×2): 2 [IU] via SUBCUTANEOUS
  Administered 2024-08-25 – 2024-08-26 (×2): 1 [IU] via SUBCUTANEOUS
  Administered 2024-08-26: 2 [IU] via SUBCUTANEOUS
  Administered 2024-08-26 – 2024-08-29 (×6): 1 [IU] via SUBCUTANEOUS
  Filled 2024-08-21: qty 2
  Filled 2024-08-21 (×2): qty 1
  Filled 2024-08-21: qty 2
  Filled 2024-08-21 (×2): qty 1
  Filled 2024-08-21: qty 2
  Filled 2024-08-21: qty 1
  Filled 2024-08-21: qty 2
  Filled 2024-08-21 (×3): qty 1
  Filled 2024-08-21: qty 2
  Filled 2024-08-21 (×3): qty 1

## 2024-08-21 MED ORDER — ACETAMINOPHEN 325 MG PO TABS
650.0000 mg | ORAL_TABLET | Freq: Four times a day (QID) | ORAL | Status: DC | PRN
  Administered 2024-08-22 – 2024-08-29 (×9): 650 mg via ORAL
  Filled 2024-08-21 (×10): qty 2

## 2024-08-21 MED ORDER — ONDANSETRON HCL 4 MG/2ML IJ SOLN
4.0000 mg | Freq: Four times a day (QID) | INTRAMUSCULAR | Status: DC | PRN
  Administered 2024-08-23 – 2024-08-29 (×3): 4 mg via INTRAVENOUS
  Filled 2024-08-21 (×3): qty 2

## 2024-08-21 MED ORDER — FENTANYL CITRATE (PF) 50 MCG/ML IJ SOSY: 12.5000 ug | PREFILLED_SYRINGE | INTRAMUSCULAR | Status: DC | PRN

## 2024-08-21 MED ORDER — FENTANYL CITRATE (PF) 50 MCG/ML IJ SOSY
12.5000 ug | PREFILLED_SYRINGE | Freq: Once | INTRAMUSCULAR | Status: AC
  Administered 2024-08-21: 12.5 ug via INTRAVENOUS
  Filled 2024-08-21: qty 1

## 2024-08-21 MED ORDER — IOHEXOL 300 MG/ML  SOLN
80.0000 mL | Freq: Once | INTRAMUSCULAR | Status: AC | PRN
  Administered 2024-08-21: 80 mL via INTRAVENOUS

## 2024-08-21 MED ORDER — SODIUM CHLORIDE 0.9% FLUSH
3.0000 mL | Freq: Two times a day (BID) | INTRAVENOUS | Status: DC
  Administered 2024-08-21 – 2024-08-29 (×15): 3 mL via INTRAVENOUS

## 2024-08-21 MED ORDER — TIZANIDINE HCL 4 MG PO TABS
4.0000 mg | ORAL_TABLET | Freq: Three times a day (TID) | ORAL | Status: DC | PRN
  Administered 2024-08-26 – 2024-08-28 (×3): 4 mg via ORAL
  Filled 2024-08-21 (×3): qty 1

## 2024-08-21 MED ORDER — PHYTONADIONE 5 MG PO TABS
10.0000 mg | ORAL_TABLET | Freq: Once | ORAL | Status: AC
  Administered 2024-08-21: 10 mg via ORAL
  Filled 2024-08-21: qty 2

## 2024-08-21 MED ORDER — ATORVASTATIN CALCIUM 40 MG PO TABS
80.0000 mg | ORAL_TABLET | Freq: Every day | ORAL | Status: DC
  Administered 2024-08-22 – 2024-08-29 (×8): 80 mg via ORAL
  Filled 2024-08-21 (×8): qty 2

## 2024-08-21 MED ORDER — INSULIN ASPART 100 UNIT/ML IJ SOLN
0.0000 [IU] | Freq: Every day | INTRAMUSCULAR | Status: DC
  Administered 2024-08-21 – 2024-08-25 (×3): 2 [IU] via SUBCUTANEOUS
  Administered 2024-08-26: 3 [IU] via SUBCUTANEOUS
  Filled 2024-08-21: qty 3
  Filled 2024-08-21 (×3): qty 2

## 2024-08-21 NOTE — Progress Notes (Signed)
 Hospitalist Transfer Note:    Nursing staff, Please call [facility] Admits & Consults System-Wide number on Amion [phone]) as soon as patient's arrival, so appropriate admitting provider can evaluate the pt.  Transferring facility: [facility] Requesting provider: Margit Paris, PA (EDP at [facility]) Reason for transfer: admission for further evaluation and management of acute encephalopathy, poor pain control after recent ground-level fall, supratherapeutic INR.     83 year old female with history of paroxysmal atrial fibrillation chronically anticoagulated on warfarin,  who presented to [facility] ED for evaluation of intermittent confusion/disorientation occurring over the last few days.  She had presented to the [facility] health system 1 week ago after experiencing a ground-level fall in which she hit the right portion of her head.  Imaging at that time was notable for no evidence of intracranial hemorrhage, but showed a right sided scalp hematoma.  She was discharged to home from the ED with prn tramadol  for pain.  Family conveys that the patient has been taking the tramadol  on as needed basis, but is noted suboptimal pain control as a relates to right sided headache and abdominal discomfort.  Tim conveys that the patient's disorientation, including being unable to correctly identify the current date/year, has been intermittent over the last few days.   While she currently lives alone at home, she is in the process of moving into the home of one of her children.  Labs were notable for supratherapeutic INR of 5.8 in the setting of chronic anticoagulation on warfarin for paroxysmal atrial fibrillation.  Imaging notable for CT of the head showed interval increase in the size of her right scalp hematoma, and demonstrated no evidence of intracranial hemorrhage.  Additionally, CT imaging of the abdomen showed a small rectus sheath hematoma, without report of active contrast extravasation.  Subsequently, I accepted this  patient for transfer for observation to a med-tele bed at [facility] or [facility] (first available) for further work-up and management of the above.     Eva Pore, DO Hospitalist

## 2024-08-21 NOTE — H&P (Signed)
 " History and Physical    [name] [name] FMW:[id number] DOB: May 11, 1941 DOA: 08/21/2024  PCP: Arloa Jarvis, NP   Patient coming from: Home   Chief Complaint: AMS   HPI: [name] [name] is a 83 y.o. female with medical history significant for hypertension, hyperlipidemia, type 2 diabetes mellitus, CAD, HFpEF, history of CVA, history of DVT, and PAF on warfarin who presents for evaluation of confusion.  Patient was brought into the ED by her daughter-in-law who reports that the patient has been intermittently confused and unsteady on her feet ever since her fall on 08/14/2024 for which she was evaluated in the ED.  She was started on tramadol  after the fall which she has been taking as needed.  She continues to have pain in her head, shoulder, and left lower abdominal quadrant.  She denies any focal numbness or weakness, melena or hematochezia, chest pain, cough, shortness of breath, fevers, or chills.  Patient notes that she occasionally has difficulty finding the right word to use, but does not believe that she has been confused recently as reported by family.  She states that she continues to do word puzzles and read books daily.  [facility] ED Course: Upon arrival to the ED, patient is found to be afebrile and saturating well on room air while awake with stable BP.  Labs are most notable for sodium 131, creatinine 1.32, normal WBC, hemoglobin 11.4, normal urine, and INR 5.8.  There are no acute findings on chest x-ray.  There is no acute abnormality on CT cervical spine.  CT head is negative for acute intracranial abnormality but notable for right parietal scalp hematoma with findings suggestive of interval bleeding.  CT of the abdomen and pelvis reveals hematoma on the left rectus sheath with possible tiny focus of acute bleeding.  Patient was treated in the ED with 500 mL of NS and fentanyl .  She was transferred to [facility] for admission.  Review of Systems:  All  other systems reviewed and apart from HPI, are negative.  Past Medical History:  Diagnosis Date   Allergy    Anal fissure    Anxiety    CAD (coronary artery disease)    s/p inf MI 2007 - tx w/ DES to RCA // s/p POBA to [facility] and DES to dRCA in 11/18 ([facility] in [place], [place]) // Myoview  3/21: low risk // s/p DES to pRCA   Cataract    removed both eyes   Cerebrovascular disease    Carotid US  09/2017 [facility]): mild plaque (<50%) in both carotid arteries   Chronic neck pain    Chronic pain syndrome    Diabetes Mellitus, Type 2    Diverticular disease    DJD (degenerative joint disease)    Echocardiogram 10/2018    Echo 10/2018: EF 55-60, normal RVSF, mod MAC, mod TR, severe AoV calcification and sclerosis with nodular calcium /mobile area of calcium  in the LVOT (small veg vs Lambl's excrescence  - consider TEE), mild AI, mild AS (mean 11).    Echocardiogram 04/2019    Echocardiogram 04/2019: EF 60-65, basal septal hypertrophy, grade 2 diastolic dysfunction, normal wall motion, normal RV SF, mild LAE, mod MAC, trivial MR, mod sclerosis of the aortic valve with mod aortic annular calcification, thin mobile filamentous structure on ventricular side of AV likely representing Lambl's excrescence, mild AI, mild TR   Frequent UTI's    Gastroesophageal reflux disease    H/O bacterial endocarditis    Rx w IV Abxs  in 2020 [facility] in [place], [place]) // F/u echo with AoV Lambl's excrescence   H/O hiatal hernia    HTN (hypertension)    Hx of fall 10/2020   Hx of MI 2007   Hx of Stroke    IBS (irritable bowel syndrome)    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 (Cervical Fusion)   Premature ventricular  contractions    Tubular adenoma of colon    Ulcer    [facility] [place] [facility]   Urinary incontinence     Past Surgical History:  Procedure Laterality Date   APPENDECTOMY  1955   BLADDER REPAIR  2007   BRONCHIAL BIOPSY  05/17/2022   Procedure: BRONCHIAL BIOPSIES;  Surgeon: Gladis Leonor HERO, MD;  Location: [facility] ENDOSCOPY;  Service: Pulmonary;;   BRONCHIAL BRUSHINGS  05/17/2022   Procedure: BRONCHIAL BRUSHINGS;  Surgeon: Gladis Leonor HERO, MD;  Location: [facility] ENDOSCOPY;  Service: Pulmonary;;   BRONCHIAL NEEDLE ASPIRATION BIOPSY  05/17/2022   Procedure: BRONCHIAL NEEDLE ASPIRATION BIOPSIES;  Surgeon: Gladis Leonor HERO, MD;  Location: [facility] ENDOSCOPY;  Service: Pulmonary;;   CARDIAC CATHETERIZATION  2007   Stents   CERVICAL SPINE SURGERY  07/2005   Dr Armen   CHOLECYSTECTOMY  2007   COLONOSCOPY     CORONARY PRESSURE/FFR WITH 3D MAPPING N/A 06/28/2021   Procedure: Coronary Pressure Wire/FFR w/3D Mapping;  Surgeon: Verlin Lonni BIRCH, MD;  Location: [facility] INVASIVE CV LAB;  Service: Cardiovascular;  Laterality: N/A;   CORONARY STENT INTERVENTION N/A 03/09/2020   Procedure: CORONARY STENT INTERVENTION;  Surgeon: Verlin Lonni BIRCH, MD;  Location: [facility] INVASIVE CV LAB;  Service: Cardiovascular;  Laterality: N/A;   CORONARY STENT INTERVENTION N/A 06/28/2021   Procedure: CORONARY STENT INTERVENTION;  Surgeon: Verlin Lonni BIRCH, MD;  Location: [facility] INVASIVE CV LAB;  Service: Cardiovascular;  Laterality: N/A;   DENTAL SURGERY  05/2013   replaced an inplant   EYE SURGERY  2011   Bilteral   FIDUCIAL MARKER PLACEMENT  05/17/2022   Procedure: FIDUCIAL MARKER PLACEMENT;  Surgeon: Gladis Leonor HERO, MD;  Location: [facility] ENDOSCOPY;  Service: Pulmonary;;  LUL   I & D KNEE WITH POLY EXCHANGE  09/25/2011   Procedure: IRRIGATION AND DEBRIDEMENT KNEE WITH POLY EXCHANGE;  Surgeon: Fonda SHAUNNA Olmsted, MD;  Location: [facility] OR;  Service: Orthopedics;  Laterality: Left;   INCISION AND DRAINAGE ABSCESS / HEMATOMA OF BURSA / KNEE /  THIGH  09/2011   JOINT REPLACEMENT  2012   left   LEFT HEART CATH AND CORONARY ANGIOGRAPHY N/A 03/09/2020   Procedure: LEFT HEART CATH AND CORONARY ANGIOGRAPHY;  Surgeon: Verlin Lonni BIRCH, MD;  Location: [facility] INVASIVE CV LAB;  Service: Cardiovascular;  Laterality: N/A;   LEFT HEART CATH AND CORONARY ANGIOGRAPHY N/A 06/28/2021   Procedure: LEFT HEART CATH AND CORONARY ANGIOGRAPHY;  Surgeon: Verlin Lonni BIRCH, MD;  Location: [facility] INVASIVE CV LAB;  Service: Cardiovascular;  Laterality: N/A;   left Total Knee Replacement  11/2010   Dr Beverley   NECK SURGERY     has had 3 surgeries   POLYPECTOMY     POSTERIOR CERVICAL FUSION/FORAMINOTOMY  04/08/2012   Procedure: POSTERIOR CERVICAL FUSION/FORAMINOTOMY  LEVEL 2;  Surgeon: Lynwood JONELLE Mill, MD;  Location: [facility] NEURO ORS;  Service: Neurosurgery;  Laterality: Left;  Left Cervical six-seven Foraminotomy, bilateral cervical seven-thoracic one foraminotomy, cervical six-seven, cervical seven-thoracic one fusion with posterior instrumentation   TEMPOROMANDIBULAR JOINT SURGERY     thumb surgery     TONSILLECTOMY     TONSILLECTOMY  1950   TOTAL ABDOMINAL HYSTERECTOMY     VIDEO BRONCHOSCOPY WITH RADIAL ENDOBRONCHIAL ULTRASOUND  05/17/2022   Procedure: VIDEO BRONCHOSCOPY WITH RADIAL ENDOBRONCHIAL ULTRASOUND;  Surgeon: Gladis Leonor HERO, MD;  Location: [facility] ENDOSCOPY;  Service: Pulmonary;;    Social History:   reports that she has never smoked. She has never been exposed to tobacco smoke. She has never used smokeless tobacco. She reports that she does not drink alcohol  and does not use drugs.  Allergies[1]  Family History  Problem Relation Age of Onset   Heart disease Mother    Pancreatic cancer Mother    Colon polyps Sister    Pancreatitis Sister    Lung cancer Brother    Cancer - Other Brother        vocal cord cancer   Coronary artery disease Other        sibling   Coronary artery disease Other        sibling   Colon cancer Neg Hx    Esophageal  cancer Neg Hx    Rectal cancer Neg Hx    Stomach cancer Neg Hx      Prior to Admission medications  Medication Sig Start Date End Date Taking? Authorizing Provider  Accu-Chek Softclix Lancets lancets Use to check blood sugar 2 (two) times daily. 03/17/24     Accu-Chek Softclix Lancets lancets Use to monitor Blood Sugars daily. 03/18/24     acetaminophen  (TYLENOL ) 325 MG tablet Take 650 mg by mouth every 6 (six) hours as needed for moderate pain.    [provider]  Ascorbic Acid  (VITAMIN C ) 500 MG CAPS Take 500 mg by mouth in the morning and at bedtime.    [provider]  atorvastatin  (LIPITOR ) 80 MG tablet Take 1 tablet (80 mg total) by mouth at bedtime. 03/02/24   Lelon Hamilton T, PA-C  atorvastatin  (LIPITOR ) 80 MG tablet Take 80 mg by mouth daily. 02/19/24   [provider]  Blood Glucose Monitoring Suppl (ACCU-CHEK GUIDE) w/Device KIT Use to monitor Blood Sugars daily. 03/18/24     Cinnamon 500 MG capsule Take 2 capsules twice a day by oral route as directed.    [provider]  clopidogrel  (PLAVIX ) 75 MG tablet Take 1 tablet (75 mg total) by mouth in the morning to prevent blood clots. 03/16/24     clopidogrel  (PLAVIX ) 75 MG tablet Take 75 mg by mouth daily. 02/19/24   [provider]  dicyclomine  (BENTYL ) 10 MG capsule Take 1 capsule (10 mg) by mouth every 8 hours (up to 3 times per day) for abdominal pain/spasms as needed. 03/09/24     dicyclomine  (BENTYL ) 10 MG capsule Take 10 mg by mouth 3 (three) times daily. 02/19/24   [provider]  ezetimibe  (ZETIA ) 10 MG tablet Take 1 tablet (10 mg total) by mouth daily for cholesterol. 03/16/24     ezetimibe  (ZETIA ) 10 MG tablet Take 10 mg by mouth daily. 02/19/24   [provider]  famotidine  (PEPCID ) 20 MG tablet Take 1 tablet (20 mg total) by mouth daily as needed for severe indigestion and heartburn 03/09/24     famotidine  (PEPCID )  20 MG tablet Take 20 mg by mouth 2 (two) times daily.     [provider]  gabapentin  (NEURONTIN ) 300 MG capsule Take 1 capsule by oral route every 8 hours (up to 3 times daily) for nerve/chronic pain. 03/09/24     gabapentin  (NEURONTIN ) 300 MG capsule Take 300 mg by mouth 3 (three) times daily. 02/19/24   [provider]  glucose blood (ACCU-CHEK GUIDE TEST) test strip Use to monitor Blood Sugars daily. 03/18/24     glucose blood (ASSURE PRISM  MULTI TEST) test strip Use strips as directed to check blood sugar twice daily 03/17/24     insulin  aspart (NOVOLOG ) 100 UNIT/ML FlexPen Before each meal 3 times a day, 140-199 - 2 units, 200-250 - 4 units, 251-299 - 6 units,  300-349 - 8 units,  350 or above 10 units. 02/19/24   Singh, Prashant K, MD  Insulin  Aspart FlexPen (NOVOLOG ) 100 UNIT/ML  02/19/24   [provider]  insulin  glargine (LANTUS  SOLOSTAR) 100 UNIT/ML Solostar Pen Inject 10 Units into the skin daily for diabetes. 04/28/24     insulin  glargine (LANTUS  SOLOSTAR) 100 UNIT/ML Solostar Pen Inject 10 Units into the skin daily for diabetes. 08/07/24     insulin  glargine (LANTUS ) 100 UNIT/ML injection Inject 10 Units into the skin daily.    [provider]  Insulin  Pen Needle (PEN NEEDLES) 32G X 4 MM MISC Use as directed to administer insulin  daily. 03/16/24     losartan  (COZAAR ) 50 MG tablet Take 1 tablet (50 mg total) by mouth daily. 08/05/24   Lelon Hamilton T, PA-C  metoprolol  tartrate (LOPRESSOR ) 100 MG tablet Take 1 tablet (100 mg total) by mouth 2 (two) times daily. Patient taking differently: Take 100 mg by mouth 2 (two) times daily. Per patient taking 1/2 tablet (50) mg twice daily 03/02/24   Lelon Hamilton T, PA-C  [Paused] metoprolol  tartrate (LOPRESSOR ) 50 MG tablet Take 50 mg by mouth 2 (two) times daily. Wait to take this until your doctor or other care provider tells you to start again. 02/19/24   [provider]  montelukast  (SINGULAIR ) 10 MG tablet Take 1 tablet (10 mg total) by mouth at bedtime for  allergies. 03/09/24     montelukast  (SINGULAIR ) 10 MG tablet Take 10 mg by mouth daily. 02/19/24   [provider]  [Paused] nitrofurantoin  (MACRODANTIN ) 50 MG capsule Take 1 capsule (50 mg total) by mouth daily for UTI prevention. Wait to take this until your doctor or other care provider tells you to start again. 03/16/24     nitroGLYCERIN  (NITROSTAT ) 0.4 MG SL tablet Dissolve 1 tablet under your tongue at the first sign of heart attack/chest pain; no more than 3 tablets within a 15 minute period. 03/16/24     nitroGLYCERIN  (NITROSTAT ) 0.4 MG SL tablet Place 0.4 mg under the tongue every 5 (five) minutes as needed for chest pain. 02/19/24   [provider]  Omega-3 Fatty Acids (FISH OIL) 1000 MG CAPS Take 1,000 mg by mouth in the morning and at bedtime.    [provider]  ondansetron  (ZOFRAN -ODT) 4 MG disintegrating tablet Take 4 mg by mouth every 8 (eight) hours as needed for nausea or vomiting. 02/19/24   [provider]  pantoprazole  (PROTONIX ) 40 MG tablet Take 1 tablet (40 mg total) by mouth 2 (two) times daily for indigestion and heart burn. 03/09/24     pantoprazole  (PROTONIX ) 40 MG tablet Take 40 mg by mouth 2 (two) times daily. 02/19/24  [provider]  tiZANidine  (ZANAFLEX ) 4 MG tablet Take 1 tablet by mouth twice daily as needed for chronic shoulder/neck pain. 03/16/24     tiZANidine  (ZANAFLEX ) 4 MG tablet Take 4 mg by mouth 3 (three) times daily.    [provider]  traMADol  (ULTRAM ) 50 MG tablet Take 1 tablet (50 mg total) by mouth every 6 (six) hours as needed. 08/14/24   Patt Alm Macho, MD  venlafaxine  XR (EFFEXOR -XR) 37.5 MG 24 hr capsule Take 1 capsule (37.5 mg total) by mouth daily for anxiety and depression. 03/09/24     venlafaxine  XR (EFFEXOR -XR) 37.5 MG 24 hr capsule Take 37.5 mg by mouth daily. 02/19/24   [provider]  warfarin (COUMADIN ) 2.5 MG tablet Take 2 tablets daily except 1&1/2 tablets on Thursdays or as  directed by anticoagulation clinic 06/01/24   Cooper, Michael, MD  warfarin (COUMADIN ) 2.5 MG tablet Take 2-3 tablets (5-7.5 mg total) by mouth See admin instructions. 11/17 - 11/11: While on levofloxacin , take warfarin 5mg  ( 2 tabets) by mouth daily 11/12 and on: take 3 tablets (7.5 mg) by mouth every Mon and Fri and  take two tablets (5 mg) by mouth all other days OR per anticoagulation clinic 07/20/24   Eben Reyes BROCKS, MD    Physical Exam: Vitals:   08/21/24 1900 08/21/24 1930 08/21/24 2030 08/21/24 2151  BP: 109/67 (!) 147/127 (!) 160/63 (!) 135/106  Pulse: 68 72 70 74  Resp: 17 15 18 15   Temp:   97.8 F (36.6 C) 99 F (37.2 C)  TempSrc:   Oral Oral  SpO2: 90% 100% 100% 95%    Constitutional: NAD, calm  Eyes: PERTLA, lids and conjunctivae normal ENMT: Mucous membranes are moist. Posterior pharynx clear of any exudate or lesions.   Neck: supple, no masses  Respiratory: no wheezing, no crackles. No accessory muscle use.  Cardiovascular: S1 & S2 heard, regular rate and rhythm. No extremity edema.   Abdomen: Soft, no guarding, tender at LLQ where there is some ecchymosis. Bowel sounds active.  Musculoskeletal: no clubbing / cyanosis. No joint deformity upper and lower extremities.   Skin: Periorbital, left shoulder, and LLQ ecchymoses. Skin is otherwise warm, dry, well-perfused. Neurologic: CN 2-12 grossly intact. Strength 5/5 in all 4 limbs. Alert and oriented to person, place, time, and situation.  Psychiatric: Pleasant. Cooperative.    Labs and Imaging on Admission: I have personally reviewed following labs and imaging studies  CBC: Recent Labs  Lab 08/21/24 1638  WBC 7.5  NEUTROABS 4.1  HGB 11.4*  HCT 33.8*  MCV 93.4  PLT 253   Basic Metabolic Panel: Recent Labs  Lab 08/21/24 1638  NA 131*  K 3.8  CL 93*  CO2 31  GLUCOSE 168*  BUN 18  CREATININE 1.32*  CALCIUM  8.9   GFR: Estimated Creatinine Clearance: 22.8 mL/min (A) (by C-G formula based on SCr of  1.32 mg/dL (H)). Liver Function Tests: Recent Labs  Lab 08/21/24 1638  AST 24  ALT 21  ALKPHOS 97  BILITOT 0.5  PROT 6.5  ALBUMIN 3.7   No results for input(s): LIPASE, AMYLASE in the last 168 hours. No results for input(s): AMMONIA in the last 168 hours. Coagulation Profile: Recent Labs  Lab 08/21/24 1717  INR 5.8*   Cardiac Enzymes: No results for input(s): CKTOTAL, CKMB, CKMBINDEX, TROPONINI in the last 168 hours. BNP (last 3 results) No results for input(s): PROBNP in the last 8760 hours. HbA1C: No results for input(s): HGBA1C in  the last 72 hours. CBG: Recent Labs  Lab 08/21/24 1633  GLUCAP 158*   Lipid Profile: No results for input(s): CHOL, HDL, LDLCALC, TRIG, CHOLHDL, LDLDIRECT in the last 72 hours. Thyroid  Function Tests: No results for input(s): TSH, T4TOTAL, FREET4, T3FREE, THYROIDAB in the last 72 hours. Anemia Panel: No results for input(s): VITAMINB12, FOLATE, FERRITIN, TIBC, IRON, RETICCTPCT in the last 72 hours. Urine analysis:    Component Value Date/Time   COLORURINE YELLOW 08/21/2024 1714   APPEARANCEUR CLEAR 08/21/2024 1714   LABSPEC 1.014 08/21/2024 1714   PHURINE 5.5 08/21/2024 1714   GLUCOSEU NEGATIVE 08/21/2024 1714   GLUCOSEU NEGATIVE 03/05/2022 0841   HGBUR NEGATIVE 08/21/2024 1714   BILIRUBINUR NEGATIVE 08/21/2024 1714   BILIRUBINUR negative 03/10/2015 1516   KETONESUR NEGATIVE 08/21/2024 1714   PROTEINUR NEGATIVE 08/21/2024 1714   UROBILINOGEN 1.0 03/05/2022 0841   NITRITE NEGATIVE 08/21/2024 1714   LEUKOCYTESUR NEGATIVE 08/21/2024 1714   Sepsis Labs: @LABRCNTIP (procalcitonin:4,lacticidven:4) )No results found for this or any previous visit (from the past 240 hours).   Radiological Exams on Admission: CT ABDOMEN PELVIS W CONTRAST Result Date: 08/21/2024 EXAM: CT ABDOMEN AND PELVIS WITH CONTRAST 08/21/2024 06:20:01 PM TECHNIQUE: CT of the abdomen and pelvis was performed with  the administration of 80 mL of iohexol  (OMNIPAQUE ) 300 MG/ML solution. Multiplanar reformatted images are provided for review. Automated exposure control, iterative reconstruction, and/or weight-based adjustment of the mA/kV was utilized to reduce the radiation dose to as low as reasonably achievable. COMPARISON: 07/19/2024 CLINICAL HISTORY: LLQ pain/tenderness. FINDINGS: LOWER CHEST: No acute abnormality. LIVER: The liver is unremarkable. GALLBLADDER AND BILE DUCTS: Cholecystectomy No biliary ductal dilatation. SPLEEN: No acute abnormality. PANCREAS: No acute abnormality. ADRENAL GLANDS: No acute abnormality. KIDNEYS, URETERS AND BLADDER: Scarring in both kidneys. No stones in the kidneys or ureters. No hydronephrosis. No perinephric or periureteral stranding. Urinary bladder is unremarkable. GI AND BOWEL: Stomach demonstrates no acute abnormality. Moderate fecal load. Appendectomy. There is no bowel obstruction. PERITONEUM AND RETROPERITONEUM: No ascites. No free air. VASCULATURE: Aorta is normal in caliber. Aortic atherosclerotic calcification. LYMPH NODES: No lymphadenopathy. REPRODUCTIVE ORGANS: Hysterectomy. BONES AND SOFT TISSUES: Ovoid hyperdensity in the left rectus sheath measuring 3.6 x 2.4 cm (series 6 image 35). This likely represents a hematoma. Possible tiny focus of active bleeding (series 6 image 34). Subcutaneous stranding in the left anterior abdominal wall suggesting contusion. No acute osseous abnormality. IMPRESSION: 1. Hematoma in the left rectus sheath measuring 3.6 x 2.4 cm with possible tiny focus of active bleeding. 2. Subcutaneous stranding in the left ventral abdominal wall suggesting contusion. Electronically signed by: Norman Gatlin MD 08/21/2024 07:02 PM EST RP Workstation: [id number]   DG Chest Portable 1 View Result Date: 08/21/2024 EXAM: 1 VIEW(S) XRAY OF THE CHEST 08/21/2024 06:28:27 PM COMPARISON: 08/14/2024 CLINICAL HISTORY: AMS, borderline hypoxia FINDINGS: LUNGS AND  PLEURA: Biopsy clip in left upper lung. Mildly elevated right hemidiaphragm. Left perihilar calcified granuloma. No pleural effusion. No pneumothorax. HEART AND MEDIASTINUM: Aortic atherosclerosis. No acute abnormality of the cardiac and mediastinal silhouettes. BONES AND SOFT TISSUES: Cervical fixation hardware noted. Cholecystectomy clips noted. Degenerative glenohumeral arthropathy bilaterally. No acute osseous abnormality. IMPRESSION: 1. No acute cardiopulmonary abnormality. Electronically signed by: Greig Pique MD 08/21/2024 06:58 PM EST RP Workstation: [id number]   CT CERVICAL SPINE WO CONTRAST Result Date: 08/21/2024 EXAM: CT CERVICAL SPINE WITHOUT CONTRAST 08/21/2024 04:45:37 PM TECHNIQUE: CT of the cervical spine was performed without the administration of intravenous contrast. Multiplanar reformatted images are provided for review. Automated exposure control,  iterative reconstruction, and/or weight based adjustment of the mA/kV was utilized to reduce the radiation dose to as low as reasonably achievable. COMPARISON: CT of the cervical spine 08/14/2024. CLINICAL HISTORY: Fall 1 week ago. Diffuse pain. FINDINGS: BONES AND ALIGNMENT: Posterior fixation hardware is present bilaterally at C2-C3 and C3-C4. Solid anterior fusion is present C4-C7. No acute fracture or traumatic malalignment. DEGENERATIVE CHANGES: No significant degenerative changes. SOFT TISSUES: No prevertebral soft tissue swelling. IMPRESSION: 1. No acute cervical spine fracture or traumatic malalignment. 2. Postsurgical changes with posterior fixation hardware at C2-3 and C3-4 and solid anterior fusion at C4-7. Electronically signed by: Lonni Necessary MD 08/21/2024 05:33 PM EST RP Workstation: [id number]   CT Head Wo Contrast Result Date: 08/21/2024 EXAM: CT HEAD WITHOUT CONTRAST 08/21/2024 04:45:37 PM TECHNIQUE: CT of the head was performed without the administration of intravenous contrast. Automated exposure control, iterative  reconstruction, and/or weight based adjustment of the mA/kV was utilized to reduce the radiation dose to as low as reasonably achievable. COMPARISON: 08/14/2024 CLINICAL HISTORY: Mental status change, unknown cause. Fall 1 week ago. Increased confusion and persistent diffuse pain. FINDINGS: BRAIN AND VENTRICLES: No acute hemorrhage. No evidence of acute infarct. 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 Mild periventricular and deep white matter hypodensity typical of chronic small vessel ischemia is stable. No hydrocephalus. No extra-axial collection. No mass effect or midline shift. ORBITS: Bilateral cataract resection. SINUSES: No acute abnormality. SOFT TISSUES AND SKULL: Right parietal scalp hematoma. The hematoma demonstrates a new central hyperdense component measuring 2.6 x 2.7 x 1.5 cm suggesting interval bleeding. Question additional fall. No underlying fracture or foreign body is present. No skull fracture. VASCULATURE: Atherosclerosis of skullbase vasculature without hyperdense vessel or abnormal calcification. IMPRESSION: 1. Right parietal scalp hematoma with a new central hyperdense component measuring 2.6 x 2.7 x 1.5 cm, suggesting interval bleeding, without underlying fracture or foreign body. Question additional fall. 2. Stable remote lacunar infarcts and mild chronic small vessel ischemic change. 3. No acute intracranial abnormalities. Electronically signed by: Lonni Necessary MD 08/21/2024 05:30 PM EST RP Workstation: [id number]    Assessment/Plan   1. Acute encephalopathy  - Patient's daughter-in-law brought patient to ED for intermittent confusion and off-balance gait since her fall on 12/12  - There are no acute intracranial abnormalities on head CT in the ED and no infectious s/s  - May be due to polypharmacy with recent addition or tramadol , particularly with recently decreased GFR; AKI, uncontrolled pain, and/or mild TBI could also be  contributing - Hold tramadol , renally-dose gabapentin , change Zanaflex  to as-needed only, hold Singulair , hydrate with IVF, check TSH, ammonia, RPR, and B12, consult PT, and use delirium precautions    2. Rectus sheath and scalp hematomas; supratherapeutic INR  - INR is 5.8 and scalp and rectus sheath hematomas noted on CT in ED with findings suggestive of interval bleeding into scalp hematoma and tiny focus of active bleed in rectus sheath  - She is hemodynamically stable with initial Hgb 11.4  - Hold warfarin and Plavix , give PO vit K, repeat INR and CBC tomorrow   3. AKI  - SCr is 1.32, up from apparent baseline ~0.9; no hydronephrosis on CT in ED  - Hold losartan , renally-dose medications, hydrate with IVF, repeat chem panel in am   4. Type II DM  - A1c was 7.3% in November 2025  - Check CBGs, use low-intensity SSI for now   5. PAF  - Hold warfarin, continue metoprolol  as  tolerated    6. CAD  - No anginal symptoms  - Hold Plavix  for now as above, continue Lipitor  and metoprolol     7. Depression, anxiety  - Continue Effexor     8. Nocturnal apnea and hypoxia  - CPAP while sleeping, outpatient sleep study recommended    DVT prophylaxis: SCDs  Code Status: DNR/DNI, confirmed on admission  Level of Care: Level of care: Telemetry Family Communication: Family updated from ED   Disposition Plan:  Patient is from: Home  Anticipated d/c is to: TBD Anticipated d/c date is: 12/20 or 08/23/24  Patient currently: Pending clinical stability, disposition planning Consults called: None Admission status: Observation     [name] [name] Sprinkles, MD [facility]  08/21/2024, 11:08 PM       [1]  Allergies Allergen Reactions   Sulfa  Antibiotics Itching   Codeine Nausea Only   Loop Diuretics (Sulfonamide) Itching    Listed as sulfonamide in chart that needs merged   Niacin  And Related Other (See Comments)    Must take Flush-free   Prednisone Itching   Rocephin  [Ceftriaxone ]  Itching   Sulfa  Antibiotics Itching   Codeine Nausea Only   Macrobid  [Nitrofurantoin ] Nausea Only    Severe nausea   Niacin  Other (See Comments)    Must take Flush-free   Prednisone Itching and Rash   Rocephin  [Ceftriaxone ] Itching    Has taken cefepime  without issue   Sulfonamide Derivatives Itching   Tetracycline Itching and Rash   Tetracyclines & Related Itching and Rash   "

## 2024-08-21 NOTE — ED Notes (Addendum)
 S Up[place] states patient can take home night medications except for pain medications and her coumadin .  Daughter informed

## 2024-08-21 NOTE — ED Triage Notes (Signed)
 Clemens last Friday 38ft. Concrete. Evaluated at [facility] ED. Cleared for DC. Tramadol  at home.  Returns today and seems confused. Pain is 10:10 'all over'. Feels drunk and dizzy.  No abrupt changes in the past several hours.  Polyuria. HX UTI.

## 2024-08-21 NOTE — ED Notes (Signed)
 Placed on O2 at 2 L due to dropping Sats when sleeping

## 2024-08-21 NOTE — ED Provider Notes (Signed)
 " [place] EMERGENCY DEPARTMENT AT [facility] Provider Note   CSN: [id number] Arrival date & time: 08/21/24  1614     Patient presents with: Altered Mental Status   [name] [name] is a 83 y.o. female.   Patient to ED with daughter-in-law who states the patient has had intermittent confusion and unsteady gait over the last week. She fell 12/12 while standing on a 74ft stool, falling backward onto concrete. Seen at [facility] and discharged to home. She is anticoagulated on coumadin . She endorses continued pain in the LLQ abdomen, left shoulder, right posterior neck and headache behind the right eye. No visual changes, nausea, vomiting, chest pain, SOB/DOE. Daugther-in-law states she has been intermittently confused since the fall, can't remember the date, trouble word finding and is unsteady when walking. No recurrent falls.    The history is provided by the patient and a relative. No language interpreter was used.  Altered Mental Status      Prior to Admission medications  Medication Sig Start Date End Date Taking? Authorizing Provider  Accu-Chek Softclix Lancets lancets Use to check blood sugar 2 (two) times daily. 03/17/24     Accu-Chek Softclix Lancets lancets Use to monitor Blood Sugars daily. 03/18/24     acetaminophen  (TYLENOL ) 325 MG tablet Take 650 mg by mouth every 6 (six) hours as needed for moderate pain.    [provider]  Ascorbic Acid  (VITAMIN C ) 500 MG CAPS Take 500 mg by mouth in the morning and at bedtime.    [provider]  atorvastatin  (LIPITOR ) 80 MG tablet Take 1 tablet (80 mg total) by mouth at bedtime. 03/02/24   Lelon Hamilton T, PA-C  atorvastatin  (LIPITOR ) 80 MG tablet Take 80 mg by mouth daily. 02/19/24   [provider]  Blood Glucose Monitoring Suppl (ACCU-CHEK GUIDE) w/Device KIT Use to monitor Blood Sugars daily. 03/18/24     Cinnamon 500 MG capsule Take 2 capsules twice a day by oral route as directed.    [provider]   clopidogrel  (PLAVIX ) 75 MG tablet Take 1 tablet (75 mg total) by mouth in the morning to prevent blood clots. 03/16/24     clopidogrel  (PLAVIX ) 75 MG tablet Take 75 mg by mouth daily. 02/19/24   [provider]  dicyclomine  (BENTYL ) 10 MG capsule Take 1 capsule (10 mg) by mouth every 8 hours (up to 3 times per day) for abdominal pain/spasms as needed. 03/09/24     dicyclomine  (BENTYL ) 10 MG capsule Take 10 mg by mouth 3 (three) times daily. 02/19/24   [provider]  ezetimibe  (ZETIA ) 10 MG tablet Take 1 tablet (10 mg total) by mouth daily for cholesterol. 03/16/24     ezetimibe  (ZETIA ) 10 MG tablet Take 10 mg by mouth daily. 02/19/24   [provider]  famotidine  (PEPCID ) 20 MG tablet Take 1 tablet (20 mg total) by mouth daily as needed for severe indigestion and heartburn 03/09/24     famotidine  (PEPCID ) 20 MG tablet Take 20 mg by mouth 2 (two) times daily.    [provider]  gabapentin  (NEURONTIN ) 300 MG capsule Take 1 capsule by oral route every 8 hours (up to 3 times daily) for nerve/chronic pain. 03/09/24     gabapentin  (NEURONTIN ) 300 MG capsule Take 300 mg by mouth 3 (three) times daily. 02/19/24   [provider]  glucose blood (ACCU-CHEK GUIDE TEST) test strip Use to monitor Blood Sugars daily. 03/18/24     glucose blood (ASSURE PRISM   MULTI TEST) test strip Use strips as directed to check blood sugar twice daily 03/17/24     insulin  aspart (NOVOLOG ) 100 UNIT/ML FlexPen Before each meal 3 times a day, 140-199 - 2 units, 200-250 - 4 units, 251-299 - 6 units,  300-349 - 8 units,  350 or above 10 units. 02/19/24   Singh, Prashant K, MD  Insulin  Aspart FlexPen (NOVOLOG ) 100 UNIT/ML  02/19/24   [provider]  insulin  glargine (LANTUS  SOLOSTAR) 100 UNIT/ML Solostar Pen Inject 10 Units into the skin daily for diabetes. 04/28/24     insulin  glargine (LANTUS  SOLOSTAR) 100 UNIT/ML Solostar Pen Inject 10 Units into the skin daily for diabetes. 08/07/24      insulin  glargine (LANTUS ) 100 UNIT/ML injection Inject 10 Units into the skin daily.    [provider]  Insulin  Pen Needle (PEN NEEDLES) 32G X 4 MM MISC Use as directed to administer insulin  daily. 03/16/24     losartan  (COZAAR ) 50 MG tablet Take 1 tablet (50 mg total) by mouth daily. 08/05/24   Lelon Hamilton T, PA-C  metoprolol  tartrate (LOPRESSOR ) 100 MG tablet Take 1 tablet (100 mg total) by mouth 2 (two) times daily. Patient taking differently: Take 100 mg by mouth 2 (two) times daily. Per patient taking 1/2 tablet (50) mg twice daily 03/02/24   Lelon Hamilton T, PA-C  [Paused] metoprolol  tartrate (LOPRESSOR ) 50 MG tablet Take 50 mg by mouth 2 (two) times daily. Wait to take this until your doctor or other care provider tells you to start again. 02/19/24   [provider]  montelukast  (SINGULAIR ) 10 MG tablet Take 1 tablet (10 mg total) by mouth at bedtime for allergies. 03/09/24     montelukast  (SINGULAIR ) 10 MG tablet Take 10 mg by mouth daily. 02/19/24   [provider]  [Paused] nitrofurantoin  (MACRODANTIN ) 50 MG capsule Take 1 capsule (50 mg total) by mouth daily for UTI prevention. Wait to take this until your doctor or other care provider tells you to start again. 03/16/24     nitroGLYCERIN  (NITROSTAT ) 0.4 MG SL tablet Dissolve 1 tablet under your tongue at the first sign of heart attack/chest pain; no more than 3 tablets within a 15 minute period. 03/16/24     nitroGLYCERIN  (NITROSTAT ) 0.4 MG SL tablet Place 0.4 mg under the tongue every 5 (five) minutes as needed for chest pain. 02/19/24   [provider]  Omega-3 Fatty Acids (FISH OIL) 1000 MG CAPS Take 1,000 mg by mouth in the morning and at bedtime.    [provider]  ondansetron  (ZOFRAN -ODT) 4 MG disintegrating tablet Take 4 mg by mouth every 8 (eight) hours as needed for nausea or vomiting. 02/19/24   [provider]  pantoprazole  (PROTONIX ) 40 MG tablet Take 1 tablet (40 mg total) by  mouth 2 (two) times daily for indigestion and heart burn. 03/09/24     pantoprazole  (PROTONIX ) 40 MG tablet Take 40 mg by mouth 2 (two) times daily. 02/19/24   [provider]  tiZANidine  (ZANAFLEX ) 4 MG tablet Take 1 tablet by mouth twice daily as needed for chronic shoulder/neck pain. 03/16/24     tiZANidine  (ZANAFLEX ) 4 MG tablet Take 4 mg by mouth 3 (three) times daily.    [provider]  traMADol  (ULTRAM ) 50 MG tablet Take 1 tablet (50 mg total) by mouth every 6 (six) hours as needed. 08/14/24   Patt Alm Macho, MD  venlafaxine  XR (EFFEXOR -XR) 37.5 MG 24 hr capsule Take 1 capsule (37.5  mg total) by mouth daily for anxiety and depression. 03/09/24     venlafaxine  XR (EFFEXOR -XR) 37.5 MG 24 hr capsule Take 37.5 mg by mouth daily. 02/19/24   [provider]  warfarin (COUMADIN ) 2.5 MG tablet Take 2 tablets daily except 1&1/2 tablets on Thursdays or as directed by anticoagulation clinic 06/01/24   Cooper, Michael, MD  warfarin (COUMADIN ) 2.5 MG tablet Take 2-3 tablets (5-7.5 mg total) by mouth See admin instructions. 11/17 - 11/11: While on levofloxacin , take warfarin 5mg  ( 2 tabets) by mouth daily 11/12 and on: take 3 tablets (7.5 mg) by mouth every Mon and Fri and  take two tablets (5 mg) by mouth all other days OR per anticoagulation clinic 07/20/24   Eben Reyes BROCKS, MD    Allergies: Sulfa  antibiotics, Codeine, Loop diuretics (sulfonamide), Niacin  and related, Prednisone, Rocephin  [ceftriaxone ], Sulfa  antibiotics, Codeine, Macrobid  [nitrofurantoin ], Niacin , Prednisone, Rocephin  [ceftriaxone ], Sulfonamide derivatives, Tetracycline, and Tetracyclines & related    Review of Systems  Updated Vital Signs BP (!) 147/127   Pulse 72   Temp 98 F (36.7 C)   Resp 15   SpO2 100%   Physical Exam Vitals and nursing note reviewed.  Constitutional:      General: She is not in acute distress.    Appearance: Normal appearance. She is not ill-appearing.  HENT:     Head:  Normocephalic.     Comments: Aged bruising around right eye, lateral to posterior right neck.     Right Ear: Tympanic membrane normal.     Left Ear: Tympanic membrane normal.     Ears:     Comments: No hemotympanum.     Nose: Nose normal.     Mouth/Throat:     Mouth: Mucous membranes are moist.  Eyes:     Extraocular Movements: Extraocular movements intact.     Pupils: Pupils are equal, round, and reactive to light.  Cardiovascular:     Rate and Rhythm: Normal rate and regular rhythm.  Pulmonary:     Effort: Pulmonary effort is normal.     Breath sounds: No wheezing, rhonchi or rales.  Chest:     Chest wall: No tenderness.  Abdominal:     General: There is no distension.     Palpations: Abdomen is soft.     Tenderness: There is abdominal tenderness (LLQ tendernesss to soft abdomen). There is no guarding.  Musculoskeletal:        General: Normal range of motion.     Cervical back: Normal range of motion and neck supple.  Neurological:     Mental Status: She is alert and oriented to person, place, and time.     GCS: GCS eye subscore is 4. GCS verbal subscore is 5. GCS motor subscore is 6.     Cranial Nerves: Cranial nerves 2-12 are intact. No dysarthria or facial asymmetry.     Motor: No weakness or pronator drift.     Coordination: Finger-Nose-Finger Test normal.     Deep Tendon Reflexes:     Reflex Scores:      Patellar reflexes are 0 on the right side and 0 on the left side.    Comments: Subjective sensory discrepancy to light touch between upper arms. Equal sensation to hands and forearms.      (all labs ordered are listed, but only abnormal results are displayed) Labs Reviewed  CBC WITH DIFFERENTIAL/PLATELET - Abnormal; Notable for the following components:      Result Value   RBC 3.62 (*)  Hemoglobin 11.4 (*)    HCT 33.8 (*)    All other components within normal limits  COMPREHENSIVE METABOLIC PANEL WITH GFR - Abnormal; Notable for the following components:    Sodium 131 (*)    Chloride 93 (*)    Glucose, Bld 168 (*)    Creatinine, Ser 1.32 (*)    GFR, Estimated 40 (*)    All other components within normal limits  PROTIME-INR - Abnormal; Notable for the following components:   Prothrombin Time 54.5 (*)    INR 5.8 (*)    All other components within normal limits  CBG MONITORING, ED - Abnormal; Notable for the following components:   Glucose-Capillary 158 (*)    All other components within normal limits  URINALYSIS, ROUTINE W REFLEX MICROSCOPIC    EKG: None  Radiology: CT ABDOMEN PELVIS W CONTRAST Result Date: 08/21/2024 EXAM: CT ABDOMEN AND PELVIS WITH CONTRAST 08/21/2024 06:20:01 PM TECHNIQUE: CT of the abdomen and pelvis was performed with the administration of 80 mL of iohexol  (OMNIPAQUE ) 300 MG/ML solution. Multiplanar reformatted images are provided for review. Automated exposure control, iterative reconstruction, and/or weight-based adjustment of the mA/kV was utilized to reduce the radiation dose to as low as reasonably achievable. COMPARISON: 07/19/2024 CLINICAL HISTORY: LLQ pain/tenderness. FINDINGS: LOWER CHEST: No acute abnormality. LIVER: The liver is unremarkable. GALLBLADDER AND BILE DUCTS: Cholecystectomy No biliary ductal dilatation. SPLEEN: No acute abnormality. PANCREAS: No acute abnormality. ADRENAL GLANDS: No acute abnormality. KIDNEYS, URETERS AND BLADDER: Scarring in both kidneys. No stones in the kidneys or ureters. No hydronephrosis. No perinephric or periureteral stranding. Urinary bladder is unremarkable. GI AND BOWEL: Stomach demonstrates no acute abnormality. Moderate fecal load. Appendectomy. There is no bowel obstruction. PERITONEUM AND RETROPERITONEUM: No ascites. No free air. VASCULATURE: Aorta is normal in caliber. Aortic atherosclerotic calcification. LYMPH NODES: No lymphadenopathy. REPRODUCTIVE ORGANS: Hysterectomy. BONES AND SOFT TISSUES: Ovoid hyperdensity in the left rectus sheath measuring 3.6 x 2.4 cm (series 6  image 35). This likely represents a hematoma. Possible tiny focus of active bleeding (series 6 image 34). Subcutaneous stranding in the left anterior abdominal wall suggesting contusion. No acute osseous abnormality. IMPRESSION: 1. Hematoma in the left rectus sheath measuring 3.6 x 2.4 cm with possible tiny focus of active bleeding. 2. Subcutaneous stranding in the left ventral abdominal wall suggesting contusion. Electronically signed by: Norman Gatlin MD 08/21/2024 07:02 PM EST RP Workstation: [id number]   DG Chest Portable 1 View Result Date: 08/21/2024 EXAM: 1 VIEW(S) XRAY OF THE CHEST 08/21/2024 06:28:27 PM COMPARISON: 08/14/2024 CLINICAL HISTORY: AMS, borderline hypoxia FINDINGS: LUNGS AND PLEURA: Biopsy clip in left upper lung. Mildly elevated right hemidiaphragm. Left perihilar calcified granuloma. No pleural effusion. No pneumothorax. HEART AND MEDIASTINUM: Aortic atherosclerosis. No acute abnormality of the cardiac and mediastinal silhouettes. BONES AND SOFT TISSUES: Cervical fixation hardware noted. Cholecystectomy clips noted. Degenerative glenohumeral arthropathy bilaterally. No acute osseous abnormality. IMPRESSION: 1. No acute cardiopulmonary abnormality. Electronically signed by: Greig Pique MD 08/21/2024 06:58 PM EST RP Workstation: [id number]   CT CERVICAL SPINE WO CONTRAST Result Date: 08/21/2024 EXAM: CT CERVICAL SPINE WITHOUT CONTRAST 08/21/2024 04:45:37 PM TECHNIQUE: CT of the cervical spine was performed without the administration of intravenous contrast. Multiplanar reformatted images are provided for review. Automated exposure control, iterative reconstruction, and/or weight based adjustment of the mA/kV was utilized to reduce the radiation dose to as low as reasonably achievable. COMPARISON: CT of the cervical spine 08/14/2024. CLINICAL HISTORY: Fall 1 week ago. Diffuse pain. FINDINGS: BONES AND ALIGNMENT: Posterior fixation hardware  is present bilaterally at C2-C3 and C3-C4.  Solid anterior fusion is present C4-C7. No acute fracture or traumatic malalignment. DEGENERATIVE CHANGES: No significant degenerative changes. SOFT TISSUES: No prevertebral soft tissue swelling. IMPRESSION: 1. No acute cervical spine fracture or traumatic malalignment. 2. Postsurgical changes with posterior fixation hardware at C2-3 and C3-4 and solid anterior fusion at C4-7. Electronically signed by: Lonni Necessary MD 08/21/2024 05:33 PM EST RP Workstation: [id number]   CT Head Wo Contrast Result Date: 08/21/2024 EXAM: CT HEAD WITHOUT CONTRAST 08/21/2024 04:45:37 PM TECHNIQUE: CT of the head was performed without the administration of intravenous contrast. Automated exposure control, iterative reconstruction, and/or weight based adjustment of the mA/kV was utilized to reduce the radiation dose to as low as reasonably achievable. COMPARISON: 08/14/2024 CLINICAL HISTORY: Mental status change, unknown cause. Fall 1 week ago. Increased confusion and persistent diffuse pain. FINDINGS: BRAIN AND VENTRICLES: No acute hemorrhage. No evidence of acute infarct. 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 Mild periventricular and deep white matter hypodensity typical of chronic small vessel ischemia is stable. No hydrocephalus. No extra-axial collection. No mass effect or midline shift. ORBITS: Bilateral cataract resection. SINUSES: No acute abnormality. SOFT TISSUES AND SKULL: Right parietal scalp hematoma. The hematoma demonstrates a new central hyperdense component measuring 2.6 x 2.7 x 1.5 cm suggesting interval bleeding. Question additional fall. No underlying fracture or foreign body is present. No skull fracture. VASCULATURE: Atherosclerosis of skullbase vasculature without hyperdense vessel or abnormal calcification. IMPRESSION: 1. Right parietal scalp hematoma with a new central hyperdense component measuring 2.6 x 2.7 x 1.5 cm, suggesting interval bleeding, without underlying  fracture or foreign body. Question additional fall. 2. Stable remote lacunar infarcts and mild chronic small vessel ischemic change. 3. No acute intracranial abnormalities. Electronically signed by: Lonni Necessary MD 08/21/2024 05:30 PM EST RP Workstation: [id number]     Procedures   Medications Ordered in the ED  iohexol  (OMNIPAQUE ) 300 MG/ML solution 80 mL (80 mLs Intravenous Contrast Given 08/21/24 1812)  sodium chloride  0.9 % bolus 500 mL (500 mLs Intravenous New Bag/Given 08/21/24 1946)  fentaNYL  (SUBLIMAZE ) injection 12.5 mcg (12.5 mcg Intravenous Given 08/21/24 1950)    Clinical Course as of 08/21/24 2009  Fri Aug 21, 2024  1731 Patient BIB family for evaluation of unsteady walking/off balance, intermittent confusion, and ongoing pain since fall on 12/12. She was seen and medically evaluated the night of the fall but presenting symptoms developed through the week prompting re-evaluation.  [SU]  [id number] Patient has remained awake and conversant with family member at bedside. O2 saturation observed to go into the low 80's with no appreciable SOB by the patient. No chest pain. 2L O2 applied. CXR obtained and is negative. No tachycardia, CP, pleurisy to suggest PE.  [SU]  1926 Labs c/w mild renal dysfunction, similar to previous. Na 131, Ch 93. No leukocytosis. INR found significantly elevated at 5.8. Patient/family deny melena or change in stool color. Hgb 11.4, baseline appears to be 12-13.   CT head showing:  IMPRESSION: 1. Right parietal scalp hematoma with a new central hyperdense component measuring 2.6 x 2.7 x 1.5 cm, suggesting interval bleeding, without underlying fracture or foreign body. Question additional fall. 2. Stable remote lacunar infarcts and mild chronic small vessel ischemic change. 3. No acute intracranial abnormalities.  CT abd/pel:  IMPRESSION: 1. Hematoma in the left rectus sheath measuring 3.6 x 2.4 cm with possible tiny focus of active bleeding. 2.  Subcutaneous stranding in the left ventral  abdominal wall suggesting  contusion.  Given patient's elevated INR, increasing hematoma of scalp and abdominal wall suggesting bleeding, unsteadiness, low oxygen saturations, feel she would benefit from admission for observation, correction of coagulopathy, and improvement in mobility before going back home.   Discussed with Dr. Marcene, [facility], who accepts the patient for admission.  [SU]    Clinical Course User Index [SU] Odell Balls, PA-C                                 Medical Decision Making Amount and/or Complexity of Data Reviewed Labs: ordered. Radiology: ordered.  Risk Prescription drug management. Decision regarding hospitalization.        Final diagnoses:  Hypercoagulopathy  Hematoma of scalp, subsequent encounter  Abdominal wall hematoma, initial encounter  Hypoxia  Intermittent confusion    ED Discharge Orders     None          Odell Balls, PA-C 08/21/24 2009    Pamella Sharper A, DO 08/27/24 1315  "

## 2024-08-22 DIAGNOSIS — G9340 Encephalopathy, unspecified: Secondary | ICD-10-CM | POA: Diagnosis not present

## 2024-08-22 LAB — BASIC METABOLIC PANEL WITH GFR
Anion gap: 7 (ref 5–15)
BUN: 16 mg/dL (ref 8–23)
CO2: 28 mmol/L (ref 22–32)
Calcium: 8.2 mg/dL — ABNORMAL LOW (ref 8.9–10.3)
Chloride: 98 mmol/L (ref 98–111)
Creatinine, Ser: 1.02 mg/dL — ABNORMAL HIGH (ref 0.44–1.00)
GFR, Estimated: 54 mL/min — ABNORMAL LOW
Glucose, Bld: 230 mg/dL — ABNORMAL HIGH (ref 70–99)
Potassium: 3.9 mmol/L (ref 3.5–5.1)
Sodium: 133 mmol/L — ABNORMAL LOW (ref 135–145)

## 2024-08-22 LAB — CBC
HCT: 31.9 % — ABNORMAL LOW (ref 36.0–46.0)
Hemoglobin: 10.5 g/dL — ABNORMAL LOW (ref 12.0–15.0)
MCH: 31.1 pg (ref 26.0–34.0)
MCHC: 32.9 g/dL (ref 30.0–36.0)
MCV: 94.4 fL (ref 80.0–100.0)
Platelets: 217 K/uL (ref 150–400)
RBC: 3.38 MIL/uL — ABNORMAL LOW (ref 3.87–5.11)
RDW: 13.6 % (ref 11.5–15.5)
WBC: 6.7 K/uL (ref 4.0–10.5)
nRBC: 0 % (ref 0.0–0.2)

## 2024-08-22 LAB — T4, FREE: Free T4: 0.8 ng/dL (ref 0.80–2.00)

## 2024-08-22 LAB — GLUCOSE, CAPILLARY
Glucose-Capillary: 153 mg/dL — ABNORMAL HIGH (ref 70–99)
Glucose-Capillary: 173 mg/dL — ABNORMAL HIGH (ref 70–99)
Glucose-Capillary: 88 mg/dL (ref 70–99)
Glucose-Capillary: 156 mg/dL — ABNORMAL HIGH (ref 70–99)

## 2024-08-22 LAB — AMMONIA: Ammonia: 43 umol/L — ABNORMAL HIGH (ref 9–35)

## 2024-08-22 LAB — PROTIME-INR
INR: 6.1 (ref 0.8–1.2)
INR: 3.5 — ABNORMAL HIGH (ref 0.8–1.2)
Prothrombin Time: 56.9 s — ABNORMAL HIGH (ref 11.4–15.2)
Prothrombin Time: 37 s — ABNORMAL HIGH (ref 11.4–15.2)

## 2024-08-22 LAB — TSH: TSH: 1.69 u[IU]/mL (ref 0.350–4.500)

## 2024-08-22 LAB — SYPHILIS: RPR W/REFLEX TO RPR TITER AND TREPONEMAL ANTIBODIES, TRADITIONAL SCREENING AND DIAGNOSIS ALGORITHM: RPR Ser Ql: NONREACTIVE

## 2024-08-22 LAB — VITAMIN B12: Vitamin B-12: 361 pg/mL (ref 180–914)

## 2024-08-22 MED ORDER — INSULIN ASPART 100 UNIT/ML IJ SOLN
3.0000 [IU] | Freq: Three times a day (TID) | INTRAMUSCULAR | Status: DC
  Administered 2024-08-22 – 2024-08-29 (×15): 3 [IU] via SUBCUTANEOUS
  Filled 2024-08-22 (×16): qty 3

## 2024-08-22 MED ORDER — LACTULOSE 10 GM/15ML PO SOLN
20.0000 g | Freq: Three times a day (TID) | ORAL | Status: AC
  Administered 2024-08-22 (×3): 20 g via ORAL
  Filled 2024-08-22 (×3): qty 30

## 2024-08-22 MED ORDER — CYANOCOBALAMIN 1000 MCG/ML IJ SOLN
1000.0000 ug | Freq: Every day | INTRAMUSCULAR | Status: AC
  Administered 2024-08-22 – 2024-08-28 (×7): 1000 ug via SUBCUTANEOUS
  Filled 2024-08-22 (×7): qty 1

## 2024-08-22 MED ORDER — VITAMIN K1 10 MG/ML IJ SOLN
2.0000 mg | Freq: Once | INTRAVENOUS | Status: AC
  Administered 2024-08-22: 2 mg via INTRAVENOUS
  Filled 2024-08-22: qty 0.2

## 2024-08-22 NOTE — TOC Initial Note (Signed)
 Transition of Care [place]) - Initial/Assessment Note    Patient Details  Name: [name] [name] MRN: [id number] Date of Birth: 1941-08-06  Transition of Care [facility]) CM/SW Contact:    Sonda Manuella Quill, RN Phone Number: 08/22/2024, 5:25 PM  Clinical Narrative:                 [place] w/ pt in room; pt said she lives at home w/ her dtr [name] [name]; she plans to return at d/c w/ family support; she identified POC dtr [name] [name] [phone]); family will provide transportation; insurance/PCP verified; she denied SDOH risks; pt has cane and walker; she also has HHPT/OT w/ [facility]; pt said she would like to con't services w/ agency; Cheryl at [facility] notified; awaiting PT/OT evals; IP CM will follow.   Expected Discharge Plan: Home w Home Health Services Barriers to Discharge: Continued Medical Work up   Patient Goals and CMS Choice Patient states their goals for this hospitalization and ongoing recovery are:: home     Carmi ownership interest in [facility].provided to:: Patient    Expected Discharge Plan and Services   Discharge Planning Services: CM Consult   Living arrangements for the past 2 months: Apartment                 DME Arranged: N/A DME Agency: NA       HH Arranged: NA HH Agency: NA        Prior Living Arrangements/Services Living arrangements for the past 2 months: Apartment Lives with:: Adult Children Patient language and need for interpreter reviewed:: Yes Do you feel safe going back to the place where you live?: Yes      Need for Family Participation in Patient Care: Yes (Comment) Care giver support system in place?: Yes (comment) Current home services: DME (cane, walker; HHPT/OT w/ [facility]) Criminal Activity/Legal Involvement Pertinent to Current Situation/Hospitalization: No - Comment as needed  Activities of Daily Living   ADL Screening (condition at time of admission) Independently performs ADLs?: Yes (appropriate for developmental  age) Is the patient deaf or have difficulty hearing?: No Does the patient have difficulty seeing, even when wearing glasses/contacts?: No Does the patient have difficulty concentrating, remembering, or making decisions?: No  Permission Sought/Granted Permission sought to share information with : Case Manager Permission granted to share information with : Yes, Verbal Permission Granted  Share Information with NAME: Case Manager     Permission granted to share info w Relationship: [name] [name] (dtr) [phone]     Emotional Assessment Appearance:: Appears stated age Attitude/Demeanor/Rapport: Gracious Affect (typically observed): Accepting Orientation: : Oriented to Self, Oriented to Place, Oriented to  Time, Oriented to Situation Alcohol  / Substance Use: Not Applicable Psych Involvement: No (comment)  Admission diagnosis:  Hypoxia [R09.02] Acute encephalopathy [G93.40] Hypercoagulopathy [D68.59] Intermittent confusion [R41.0] Hematoma of scalp, subsequent encounter [S00.03XD] Abdominal wall hematoma, initial encounter [S30.11XA] Patient Active Problem List   Diagnosis Date Noted   Acute encephalopathy 08/21/2024   Scalp hematoma 08/21/2024   Rectus sheath hematoma 08/21/2024   Supratherapeutic INR 08/21/2024   Right wrist pain 07/20/2024   AKI (acute kidney injury) 07/19/2024   Hypotension 07/19/2024   T2DM (type 2 diabetes mellitus) (HCC) 07/19/2024   CAD (coronary artery disease) 07/19/2024   PAF (paroxysmal atrial fibrillation) (HCC) 07/19/2024   Scapholunate ligament injury with no instability, right, initial encounter 07/19/2024   Vasovagal syncope 07/19/2024   Atrial fibrillation (HCC) 04/14/2024   Long term (current) use of anticoagulants 04/14/2024  Enteritis 03/23/2024   Paroxysmal atrial fibrillation (HCC) 03/02/2024   Protein-calorie malnutrition, severe 02/14/2024   Sepsis due to urinary tract infection (HCC) 02/12/2024   Lethargy 02/12/2024   Cellulitis  and abscess of right lower extremity 02/14/2023   Chronic diastolic CHF (congestive heart failure) (HCC) 02/14/2023   Abscess 02/13/2023   Elevated lipase 09/27/2022   Anorexia 09/27/2022   Chronic hyponatremia 09/26/2022   Left leg swelling 05/18/2022   Acute post-operative pain 05/18/2022   Multiple pulmonary nodules 03/22/2022   Encounter for general adult medical examination with abnormal findings 10/04/2021   Hx of completed stroke 05/12/2021   Coagulation disorder 05/12/2021   Stage 3b chronic kidney disease (HCC) 09/13/2020   History of bacterial endocarditis 10/28/2018   Other seasonal allergic rhinitis 07/08/2014   Diabetic peripheral neuropathy (HCC) 06/29/2014   Essential hypertension 08/03/2009   CAD (coronary artery disease) 01/14/2008   Dizziness 10/15/2007   DM2 (diabetes mellitus, type 2) (HCC) 08/15/2007   Hyperlipidemia 06/13/2007   Depression with anxiety 06/13/2007   GERD (gastroesophageal reflux disease) 06/13/2007   Osteoarthritis 06/13/2007   PCP:  Arloa Jarvis, NP Pharmacy:   [facility] [phone] [place], [place] - [address] AT [facility] [address] [place] [address] Phone: [phone] Fax: [phone]  [facility] [address] [place] [address] Phone: [phone] Fax: [phone]  [place] - [facility] [address] [place] [address] Phone: [phone] Fax: [phone]  [place] - [facility] [address] [place] [address] Phone: [phone] Fax: [phone]  [facility] [address] [place] [address] Phone: [phone] Fax: [phone]     Social Drivers of Health (SDOH) Social History: SDOH Screenings   Food Insecurity: No Food Insecurity (08/22/2024)  Housing: Low Risk (08/22/2024)  Transportation  Needs: No Transportation Needs (08/22/2024)  Utilities: Not At Risk (08/22/2024)  Alcohol  Screen: Low Risk (11/03/2021)  Depression (PHQ2-9): Low Risk (05/18/2022)  Financial Resource Strain: Low Risk (11/03/2021)  Physical Activity: Sufficiently Active (11/03/2021)  Social Connections: Moderately Isolated (08/22/2024)  Stress: No Stress Concern Present (11/03/2021)  Tobacco Use: Low Risk (08/21/2024)   SDOH Interventions: Food Insecurity Interventions: Intervention Not Indicated, Inpatient TOC Housing Interventions: Intervention Not Indicated, Inpatient TOC Transportation Interventions: Intervention Not Indicated, Inpatient TOC Utilities Interventions: Intervention Not Indicated, Inpatient TOC Social Connections Interventions: Intervention Not Indicated   Readmission Risk Interventions     No data to display

## 2024-08-22 NOTE — Progress Notes (Signed)
 Date and time results received: 08/22/2024 0247 (use smartphrase .now to insert current time)  Test: INR Critical Value: INR= 6.1  Name of Provider Notified: On call MD  Orders Received? Or Actions Taken?: waiting for new order.

## 2024-08-22 NOTE — Plan of Care (Signed)
  Problem: Education: Goal: Knowledge of General Education information will improve Description: Including pain rating scale, medication(s)/side effects and non-pharmacologic comfort measures Outcome: Progressing   Problem: Clinical Measurements: Goal: Ability to maintain clinical measurements within normal limits will improve Outcome: Progressing   Problem: Activity: Goal: Risk for activity intolerance will decrease Outcome: Progressing   Problem: Nutrition: Goal: Adequate nutrition will be maintained Outcome: Progressing   Problem: Skin Integrity: Goal: Risk for impaired skin integrity will decrease Outcome: Progressing   

## 2024-08-22 NOTE — Care Management Obs Status (Signed)
 MEDICARE OBSERVATION STATUS NOTIFICATION   Patient Details  Name: [name] [name] MRN: [id number] Date of Birth: 09-11-1940   Medicare Observation Status Notification Given:  Yes    Sonda Manuella Quill, RN 08/22/2024, 4:46 PM

## 2024-08-22 NOTE — Plan of Care (Signed)
  Problem: Education: Goal: Knowledge of General Education information will improve Description: Including pain rating scale, medication(s)/side effects and non-pharmacologic comfort measures Outcome: Progressing   Problem: Clinical Measurements: Goal: Ability to maintain clinical measurements within normal limits will improve Outcome: Progressing Goal: Will remain free from infection Outcome: Progressing Goal: Diagnostic test results will improve Outcome: Progressing   Problem: Activity: Goal: Risk for activity intolerance will decrease Outcome: Progressing   Problem: Nutrition: Goal: Adequate nutrition will be maintained Outcome: Progressing   Problem: Safety: Goal: Ability to remain free from injury will improve Outcome: Progressing   

## 2024-08-22 NOTE — Progress Notes (Signed)
" °   08/22/24 0153  BiPAP/CPAP/SIPAP  $ Non-Invasive Home Ventilator  Initial  BiPAP/CPAP/SIPAP Pt Type Adult  BiPAP/CPAP/SIPAP Resmed  Mask Type Nasal mask  Dentures removed? Not applicable  Flow Rate 2 lpm  Patient Home Machine No  Patient Home Mask No  Patient Home Tubing No  Auto Titrate Yes  Minimum cmH2O 5 cmH2O  Maximum cmH2O 15 cmH2O  Device Plugged into RED Power Outlet Yes  BiPAP/CPAP /SiPAP Vitals  Pulse Rate 77  Resp 18  SpO2 96 %  Bilateral Breath Sounds Clear;Diminished  MEWS Score/Color  MEWS Score 0  MEWS Score Color Green    "

## 2024-08-22 NOTE — Progress Notes (Signed)
 [facility] PROGRESS NOTE    Progress Note  [name] [name]  FMW:[id number] DOB: [birth year] DOA: 08/21/2024 PCP: Arloa Jarvis, NP     Brief Narrative:   [name] [name] is an 83 y.o. female past medical history significant for essential hypertension, diabetes mellitus type 2, HFpEF, history of CVA, history of DVT, paroxysmal atrial fibrillation on Coumadin  comes in for acute encephalopathy, daughter-in-law provides most of the history relates she has been intermittently confused and unsteady on her feet with a fall on 08/14/2024.  Was recently started on tramadol  after a fall. She lived [place], moved to [place] recently 6 months ago, then moved to another apartment and several other moved.   Procedures: CT of the head showed no acute intracranial findings that showed us  scalp hematoma. CT of the cervical spine showed no dislocation or fractures.  There are postsurgical changes and hardware in C2-C3 C3-C4 and fusion C4-C7. CT scan of the abdomen and pelvis show rectus sheath hematoma 3 x 2.5 and subcutaneous stranding of the abdominal wall likely contusion Assessment/Plan:   Acute metabolic encephalopathy: Likely due to polypharmacy and specific tramadol  which was recently started. CT of the head showed scalp hematoma with a new central hypodense region no fractures, small lacunar infarct. She has new acute kidney injury which could be contributing to toxicity. Tramadol  was held along with Zanaflex  and Singulair . Ammonia level was 53 started on lactulose  for 2 days. TSH 1.6, RPR pending and B12 361.  Rectus sheath/scalp hematoma/supratherapeutic INR/acute blood loss anemia: INR is 5.8, she was given oral vitamin K , INR this morning is 6.1.  Go ahead and give her small dose of vitamin K  IV. CT scan showed interval suggestive bleeding of the scalp hematoma and rectus sheath. Aspirin  and Plavix  were held. Hemoglobin is lower at 10.5 this morning. Currently on IV fluids,  unsure if hemoglobin will drop after hydration. Will repeat a CBC in the morning.  Acute kidney injury: With a baseline creatinine of 0.8, on admission 1.3. She was started on IV fluids this morning is 1.0 continue IV fluids for an additional 24 hours recheck a basic metabolic panel in the morning.  Hypovolemic hyponatremia: Sodium and chloride are improving continue IV fluids for an additional 24 hours.  Cognitive Impairment: Confused since her multiple moves.  Type 2 diabetes mellitus: With an A1c of 7.3. November 2025. Continue sliding scale insulin .  Paroxysmal atrial fibrillation: Rate controlled, Coumadin  was held. Will have to discuss with daughter risks and benefits of being back on Coumadin .  CAD: Holding Plavix  continue Lipitor  and metoprolol . She is currently asymptomatic.  Depression/anxiety: Continue Effexor .  Possible obstructive sleep apnea: Continue CPAP at night.  Sleep study as an outpatient recommended. She is becoming hypoxic during sleep   DVT prophylaxis: scd Family Communication:daughters Status is: Observation The patient will require care spanning > 2 midnights and should be moved to inpatient because: acute met encephalopathy    Code Status:     Code Status Orders  (From admission, onward)           Start     Ordered   08/21/24 2245  Do not attempt resuscitation (DNR)- Limited -Do Not Intubate (DNI)  Continuous       Question Answer Comment  If pulseless and not breathing No CPR or chest compressions.   In Pre-Arrest Conditions (Patient Is Breathing and Has A Pulse) Do not intubate. Provide all appropriate non-invasive medical interventions. Avoid ICU transfer unless indicated or required.   Consent: Discussion  documented in EHR or advanced directives reviewed      08/21/24 2249           Code Status History     Date Active Date Inactive Code Status Order ID Comments User Context   07/19/2024 1506 07/20/2024 2142 Limited:  Do not attempt resuscitation (DNR) -DNR-LIMITED -Do Not Intubate/DNI  [id number]  Kandis Perkins, DO ED   03/23/2024 1556 03/26/2024 1740 Limited: Do not attempt resuscitation (DNR) -DNR-LIMITED -Do Not Intubate/DNI  [id number]  Zella Katha HERO, MD Inpatient   02/12/2024 1350 02/19/2024 1840 Limited: Do not attempt resuscitation (DNR) -DNR-LIMITED -Do Not Intubate/DNI  [id number]  Tobie Mario GAILS, MD ED   02/14/2023 0029 02/16/2023 1819 DNR [id number]  Marcene Eva NOVAK, DO Inpatient   09/27/2022 0422 09/29/2022 2045 DNR [id number]  Laveda Roosevelt, MD Inpatient   06/28/2021 1624 06/29/2021 1911 Full Code [id number]  Verlin Lonni BIRCH, MD Inpatient   03/09/2020 1914 03/10/2020 1717 Full Code [id number]  Verlin Lonni BIRCH, MD Inpatient   09/25/2011 1852 09/28/2011 1519 Full Code [id number]  Kirby Powell RAMAN, RN Inpatient         IV Access:   Peripheral IV   Procedures and diagnostic studies:   CT ABDOMEN PELVIS W CONTRAST Result Date: 08/21/2024 EXAM: CT ABDOMEN AND PELVIS WITH CONTRAST 08/21/2024 06:20:01 PM TECHNIQUE: CT of the abdomen and pelvis was performed with the administration of 80 mL of iohexol  (OMNIPAQUE ) 300 MG/ML solution. Multiplanar reformatted images are provided for review. Automated exposure control, iterative reconstruction, and/or weight-based adjustment of the mA/kV was utilized to reduce the radiation dose to as low as reasonably achievable. COMPARISON: 07/19/2024 CLINICAL HISTORY: LLQ pain/tenderness. FINDINGS: LOWER CHEST: No acute abnormality. LIVER: The liver is unremarkable. GALLBLADDER AND BILE DUCTS: Cholecystectomy No biliary ductal dilatation. SPLEEN: No acute abnormality. PANCREAS: No acute abnormality. ADRENAL GLANDS: No acute abnormality. KIDNEYS, URETERS AND BLADDER: Scarring in both kidneys. No stones in the kidneys or ureters. No hydronephrosis. No perinephric or periureteral stranding. Urinary bladder is unremarkable. GI AND BOWEL: Stomach demonstrates no acute  abnormality. Moderate fecal load. Appendectomy. There is no bowel obstruction. PERITONEUM AND RETROPERITONEUM: No ascites. No free air. VASCULATURE: Aorta is normal in caliber. Aortic atherosclerotic calcification. LYMPH NODES: No lymphadenopathy. REPRODUCTIVE ORGANS: Hysterectomy. BONES AND SOFT TISSUES: Ovoid hyperdensity in the left rectus sheath measuring 3.6 x 2.4 cm (series 6 image 35). This likely represents a hematoma. Possible tiny focus of active bleeding (series 6 image 34). Subcutaneous stranding in the left anterior abdominal wall suggesting contusion. No acute osseous abnormality. IMPRESSION: 1. Hematoma in the left rectus sheath measuring 3.6 x 2.4 cm with possible tiny focus of active bleeding. 2. Subcutaneous stranding in the left ventral abdominal wall suggesting contusion. Electronically signed by: Norman Gatlin MD 08/21/2024 07:02 PM EST RP Workstation: [id number]   DG Chest Portable 1 View Result Date: 08/21/2024 EXAM: 1 VIEW(S) XRAY OF THE CHEST 08/21/2024 06:28:27 PM COMPARISON: 08/14/2024 CLINICAL HISTORY: AMS, borderline hypoxia FINDINGS: LUNGS AND PLEURA: Biopsy clip in left upper lung. Mildly elevated right hemidiaphragm. Left perihilar calcified granuloma. No pleural effusion. No pneumothorax. HEART AND MEDIASTINUM: Aortic atherosclerosis. No acute abnormality of the cardiac and mediastinal silhouettes. BONES AND SOFT TISSUES: Cervical fixation hardware noted. Cholecystectomy clips noted. Degenerative glenohumeral arthropathy bilaterally. No acute osseous abnormality. IMPRESSION: 1. No acute cardiopulmonary abnormality. Electronically signed by: Greig Pique MD 08/21/2024 06:58 PM EST RP Workstation: [id number]   CT CERVICAL SPINE WO CONTRAST Result Date: 08/21/2024 EXAM: CT CERVICAL SPINE WITHOUT CONTRAST 08/21/2024 04:45:37 PM TECHNIQUE:  CT of the cervical spine was performed without the administration of intravenous contrast. Multiplanar reformatted images are provided for  review. Automated exposure control, iterative reconstruction, and/or weight based adjustment of the mA/kV was utilized to reduce the radiation dose to as low as reasonably achievable. COMPARISON: CT of the cervical spine 08/14/2024. CLINICAL HISTORY: Fall 1 week ago. Diffuse pain. FINDINGS: BONES AND ALIGNMENT: Posterior fixation hardware is present bilaterally at C2-C3 and C3-C4. Solid anterior fusion is present C4-C7. No acute fracture or traumatic malalignment. DEGENERATIVE CHANGES: No significant degenerative changes. SOFT TISSUES: No prevertebral soft tissue swelling. IMPRESSION: 1. No acute cervical spine fracture or traumatic malalignment. 2. Postsurgical changes with posterior fixation hardware at C2-3 and C3-4 and solid anterior fusion at C4-7. Electronically signed by: Lonni Necessary MD 08/21/2024 05:33 PM EST RP Workstation: [id number]   CT Head Wo Contrast Result Date: 08/21/2024 EXAM: CT HEAD WITHOUT CONTRAST 08/21/2024 04:45:37 PM TECHNIQUE: CT of the head was performed without the administration of intravenous contrast. Automated exposure control, iterative reconstruction, and/or weight based adjustment of the mA/kV was utilized to reduce the radiation dose to as low as reasonably achievable. COMPARISON: 08/14/2024 CLINICAL HISTORY: Mental status change, unknown cause. Fall 1 week ago. Increased confusion and persistent diffuse pain. FINDINGS: BRAIN AND VENTRICLES: No acute hemorrhage. No evidence of acute infarct. 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 Mild periventricular and deep white matter hypodensity typical of chronic small vessel ischemia is stable. No hydrocephalus. No extra-axial collection. No mass effect or midline shift. ORBITS: Bilateral cataract resection. SINUSES: No acute abnormality. SOFT TISSUES AND SKULL: Right parietal scalp hematoma. The hematoma demonstrates a new central hyperdense component measuring 2.6 x 2.7 x 1.5 cm suggesting  interval bleeding. Question additional fall. No underlying fracture or foreign body is present. No skull fracture. VASCULATURE: Atherosclerosis of skullbase vasculature without hyperdense vessel or abnormal calcification. IMPRESSION: 1. Right parietal scalp hematoma with a new central hyperdense component measuring 2.6 x 2.7 x 1.5 cm, suggesting interval bleeding, without underlying fracture or foreign body. Question additional fall. 2. Stable remote lacunar infarcts and mild chronic small vessel ischemic change. 3. No acute intracranial abnormalities. Electronically signed by: Lonni Necessary MD 08/21/2024 05:30 PM EST RP Workstation: [id number]     Medical Consultants:   None.   Subjective:    [name] [name] pain is controlled.  Objective:    Vitals:   08/22/24 0100 08/22/24 0153 08/22/24 0222 08/22/24 0513  BP:   (!) 135/99 (!) 159/66  Pulse:  77  70  Resp:  18  18  Temp:   98.2 F (36.8 C) 98.1 F (36.7 C)  TempSrc:   Oral   SpO2:  96% 98% 92%  Weight: 54.5 kg     Height: 5' (1.524 m)      SpO2: 92 % O2 Flow Rate (L/min): 2 L/min   Intake/Output Summary (Last 24 hours) at 08/22/2024 0649 Last data filed at 08/22/2024 0100 Gross per 24 hour  Intake 625.55 ml  Output --  Net 625.55 ml   Filed Weights   08/22/24 0100  Weight: 54.5 kg    Exam: General exam: In no acute distress. Respiratory system: Good air movement and clear to auscultation. Cardiovascular system: S1 & S2 heard, RRR. No JVD. Gastrointestinal system: Abdomen is nondistended, soft and nontender.  Extremities: No pedal edema. Skin: No rashes, lesions or ulcers Psychiatry: Judgement and insight appear normal. Mood & affect appropriate.    Data Reviewed:  Labs: Basic Metabolic Panel: Recent Labs  Lab 08/21/24 1638 08/22/24 0002  NA 131* 133*  K 3.8 3.9  CL 93* 98  CO2 31 28  GLUCOSE 168* 230*  BUN 18 16  CREATININE 1.32* 1.02*  CALCIUM  8.9 8.2*   GFR Estimated Creatinine  Clearance: 29.5 mL/min (A) (by C-G formula based on SCr of 1.02 mg/dL (H)). Liver Function Tests: Recent Labs  Lab 08/21/24 1638  AST 24  ALT 21  ALKPHOS 97  BILITOT 0.5  PROT 6.5  ALBUMIN 3.7   No results for input(s): LIPASE, AMYLASE in the last 168 hours. Recent Labs  Lab 08/22/24 0002  AMMONIA 43*   Coagulation profile Recent Labs  Lab 08/21/24 1717 08/22/24 0002  INR 5.8* 6.1*   COVID-19 Labs  No results for input(s): DDIMER, FERRITIN, LDH, CRP in the last 72 hours.  Lab Results  Component Value Date   SARSCOV2NAA NEGATIVE 02/12/2024   SARSCOV2NAA NEGATIVE 05/17/2022   SARSCOV2NAA Not Detected 05/15/2022    CBC: Recent Labs  Lab 08/21/24 1638 08/22/24 0002  WBC 7.5 6.7  NEUTROABS 4.1  --   HGB 11.4* 10.5*  HCT 33.8* 31.9*  MCV 93.4 94.4  PLT 253 217   Cardiac Enzymes: No results for input(s): CKTOTAL, CKMB, CKMBINDEX, TROPONINI in the last 168 hours. BNP (last 3 results) No results for input(s): PROBNP in the last 8760 hours. CBG: Recent Labs  Lab 08/21/24 1633 08/21/24 2314  GLUCAP 158* 220*   D-Dimer: No results for input(s): DDIMER in the last 72 hours. Hgb A1c: No results for input(s): HGBA1C in the last 72 hours. Lipid Profile: No results for input(s): CHOL, HDL, LDLCALC, TRIG, CHOLHDL, LDLDIRECT in the last 72 hours. Thyroid  function studies: Recent Labs    08/22/24 0002  TSH 1.690   Anemia work up: Recent Labs    08/22/24 0002  VITAMINB12 361   Sepsis Labs: Recent Labs  Lab 08/21/24 1638 08/22/24 0002  WBC 7.5 6.7   Microbiology No results found for this or any previous visit (from the past 240 hours).   Medications:    atorvastatin   80 mg Oral Daily   gabapentin   200 mg Oral BID   insulin  aspart  0-5 Units Subcutaneous QHS   insulin  aspart  0-6 Units Subcutaneous TID WC   metoprolol  tartrate  50 mg Oral BID   pantoprazole   40 mg Oral BID   sodium chloride  flush  3 mL  Intravenous Q12H   venlafaxine  XR  37.5 mg Oral Daily   Continuous Infusions:  sodium chloride  75 mL/hr at 08/21/24 2315      LOS: 0 days   [name] [name]  [facility]  08/22/2024, 6:49 AM

## 2024-08-22 NOTE — Evaluation (Signed)
 Occupational Therapy Evaluation Patient Details Name: [name] [name] MRN: [id number] DOB: [birth year] Today's Date: 08/22/2024   History of Present Illness   [name] [name] is a 83 y.o. female with medical history significant for hypertension, hyperlipidemia, type 2 diabetes mellitus, CAD, HFpEF, history of CVA, history of DVT, and PAF on warfarin who presents for evaluation of confusion. Patient was brought into the ED by her daughter-in-law who reports that the patient has been intermittently confused and unsteady on her feet ever since her fall on 08/14/2024 for which she was evaluated in the ED     Clinical Impressions Pt presents with decline in function and safety with ADLs and ADL mobility with impaired strength, balance, endurance and cognition (pt with poor insight into deficits); hx of multiple falls. PTA pt lives alone in an apartment with 2 flights of stairs; flight of stairs to get to bathroom per pt's son and DIL. Pt was Ind with ADLs/selfcare, does not drive, family cooks meals, has cane and RW but pt will not use them per DIL and has had multiple falls last 3 months with cognitive impairments that DIL says that pt does not acknowledge. Pt currently requires CGA to sit EOB, set up/sup with UB ADLs, mod A with LB ADLs, min A with toileting tasks, min A/CGA STS and mobility using RW. Pt's son and DIL very helpful providing accurate information of home set up/PLOF and also express concern of pt returning home alone and would like for pt to d/c to SNF for ST rehab after acute care stay. OT will follow acutely to maximize level of function and safety     If plan is discharge home, recommend the following:   A lot of help with bathing/dressing/bathroom;A little help with walking and/or transfers;Assistance with cooking/housework;Assist for transportation;Help with stairs or ramp for entrance     Functional Status Assessment   Patient has had a recent decline in their functional  status and demonstrates the ability to make significant improvements in function in a reasonable and predictable amount of time.     Equipment Recommendations   Tub/shower seat;BSC/3in1     Recommendations for Other Services         Precautions/Restrictions   Precautions Precautions: Fall Recall of Precautions/Restrictions: Impaired Precaution/Restrictions Comments: poor insight into deficitis, Poor safety awareness Restrictions Weight Bearing Restrictions Per Provider Order: No     Mobility Bed Mobility Overal bed mobility: Needs Assistance Bed Mobility: Supine to Sit     Supine to sit: Contact guard, HOB elevated          Transfers Overall transfer level: Needs assistance Equipment used: Rolling walker (2 wheels) Transfers: Sit to/from Stand, Bed to chair/wheelchair/BSC Sit to Stand: Min assist     Step pivot transfers: Contact guard assist     General transfer comment: min A STS from bed and commode, CGA to commode and chair, cues for hand placement using RW      Balance Overall balance assessment: Needs assistance Sitting-balance support: No upper extremity supported, Feet supported Sitting balance-Leahy Scale: Good     Standing balance support: Bilateral upper extremity supported, Single extremity supported, During functional activity Standing balance-Leahy Scale: Poor                             ADL either performed or assessed with clinical judgement   ADL Overall ADL's : Needs assistance/impaired Eating/Feeding: Independent   Grooming: Wash/dry hands;Wash/dry face;Contact guard assist;Standing  Upper Body Bathing: Set up;Supervision/ safety;Sitting   Lower Body Bathing: Moderate assistance Lower Body Bathing Details (indicate cue type and reason): simulated Upper Body Dressing : Set up;Supervision/safety   Lower Body Dressing: Moderate assistance   Toilet Transfer: Minimal assistance;Contact guard assist   Toileting-  Clothing Manipulation and Hygiene: Minimal assistance;Sit to/from stand       Functional mobility during ADLs: Minimal assistance;Contact guard assist       Vision Baseline Vision/History: 1 Wears glasses Ability to See in Adequate Light: 0 Adequate Patient Visual Report: No change from baseline       Perception         Praxis         Pertinent Vitals/Pain Pain Assessment Pain Assessment: Faces Faces Pain Scale: Hurts little more Pain Location: L shoulder Pain Descriptors / Indicators: Sore Pain Intervention(s): Monitored during session, Repositioned     Extremity/Trunk Assessment Upper Extremity Assessment Upper Extremity Assessment: Generalized weakness;Right hand dominant;LUE deficits/detail LUE Deficits / Details: L shoulder AROM impaired, 0-75 degrees FF. pt reports ROM impaired prior but more since fall LUE: Shoulder pain with ROM   Lower Extremity Assessment Lower Extremity Assessment: Defer to PT evaluation       Communication Communication Communication: No apparent difficulties Factors Affecting Communication: Hearing impaired   Cognition Arousal: Alert Behavior During Therapy: WFL for tasks assessed/performed Cognition: Cognition impaired     Awareness: Intellectual awareness impaired Memory impairment (select all impairments): Short-term memory   Executive functioning impairment (select all impairments): Reasoning, Problem solving                   Following commands: Impaired Following commands impaired: Only follows one step commands consistently, Follows multi-step commands with increased time     Cueing  General Comments   Cueing Techniques: Verbal cues;Gestural cues;Visual cues      Exercises     Shoulder Instructions      Home Living Family/patient expects to be discharged to:: Private residence Living Arrangements: Other relatives Available Help at Discharge: Family;Available 24 hours/day Type of Home: House Home  Access: Stairs to enter [employer] of Steps: 2 flights (steep flights per daughter in social worker)   Home Layout: Bed/bath upstairs;Two level     Bathroom Shower/Tub: It Trainer: Standard     Home Equipment: Cane - single point;Rollator (4 wheels)   Additional Comments: DIL states that pt does not use her RW, has has 5+ falls in past 6 months; mutiple falls/injuries last 3 years      Prior Functioning/Environment Prior Level of Function : Needs assist             Mobility Comments: has RW and cane, but does not use them ADLs Comments: Ind with ADLs, does not drive, family cooks meals, pt able to do light meal/snack prep    OT Problem List: Decreased strength;Decreased knowledge of use of DME or AE;Decreased range of motion;Decreased activity tolerance;Impaired balance (sitting and/or standing);Decreased safety awareness;Decreased cognition   OT Treatment/Interventions: Self-care/ADL training;Therapeutic exercise;Patient/family education;Balance training;Therapeutic activities;DME and/or AE instruction      OT Goals(Current goals can be found in the care plan section)   Acute Rehab OT Goals Patient Stated Goal: get better OT Goal Formulation: With patient Time For Goal Achievement: 09/05/24 Potential to Achieve Goals: Good ADL Goals Pt Will Perform Grooming: with supervision;with set-up;standing Pt Will Perform Upper Body Bathing: with set-up;sitting Pt Will Perform Lower Body Bathing: with min assist;with contact guard assist;sit to/from stand Pt  Will Perform Upper Body Dressing: with set-up;sitting Pt Will Transfer to Toilet: with contact guard assist;with supervision;ambulating Pt Will Perform Toileting - Clothing Manipulation and hygiene: with contact guard assist;with supervision;sitting/lateral leans;sit to/from stand   OT Frequency:  Min 2X/week    Co-evaluation              AM-PAC OT 6 Clicks Daily Activity      Outcome Measure Help from another person eating meals?: None Help from another person taking care of personal grooming?: A Little Help from another person toileting, which includes using toliet, bedpan, or urinal?: A Little Help from another person bathing (including washing, rinsing, drying)?: A Lot Help from another person to put on and taking off regular upper body clothing?: A Little Help from another person to put on and taking off regular lower body clothing?: A Lot 6 Click Score: 17   End of Session Equipment Utilized During Treatment: Gait belt;Rolling walker (2 wheels) Nurse Communication: Mobility status  Activity Tolerance: Patient tolerated treatment well Patient left: in chair;with call bell/phone within reach;with chair alarm set;with family/visitor present  OT Visit Diagnosis: Unsteadiness on feet (R26.81);Other abnormalities of gait and mobility (R26.89);Repeated falls (R29.6);History of falling (Z91.81);Muscle weakness (generalized) (M62.81);Pain Pain - Right/Left: Left Pain - part of body: Shoulder                Time: 8992-8951 OT Time Calculation (min): 41 min Charges:  OT General Charges $OT Visit: 1 Visit OT Evaluation $OT Eval Moderate Complexity: 1 Mod OT Treatments $Self Care/Home Management : 8-22 mins $Therapeutic Activity: 8-22 mins    Jacques Karna Loose 08/22/2024, 12:29 PM

## 2024-08-23 DIAGNOSIS — W19XXXA Unspecified fall, initial encounter: Secondary | ICD-10-CM | POA: Diagnosis not present

## 2024-08-23 DIAGNOSIS — Y92009 Unspecified place in unspecified non-institutional (private) residence as the place of occurrence of the external cause: Secondary | ICD-10-CM | POA: Diagnosis not present

## 2024-08-23 DIAGNOSIS — G9341 Metabolic encephalopathy: Secondary | ICD-10-CM | POA: Diagnosis not present

## 2024-08-23 DIAGNOSIS — N179 Acute kidney failure, unspecified: Secondary | ICD-10-CM | POA: Diagnosis not present

## 2024-08-23 DIAGNOSIS — S3011XA Contusion of abdominal wall, initial encounter: Secondary | ICD-10-CM | POA: Diagnosis not present

## 2024-08-23 DIAGNOSIS — S0003XD Contusion of scalp, subsequent encounter: Secondary | ICD-10-CM | POA: Diagnosis not present

## 2024-08-23 LAB — AMMONIA: Ammonia: 32 umol/L (ref 9–35)

## 2024-08-23 LAB — BASIC METABOLIC PANEL WITH GFR
Anion gap: 11 (ref 5–15)
BUN: 10 mg/dL (ref 8–23)
CO2: 26 mmol/L (ref 22–32)
Calcium: 8.9 mg/dL (ref 8.9–10.3)
Chloride: 103 mmol/L (ref 98–111)
Creatinine, Ser: 0.6 mg/dL (ref 0.44–1.00)
GFR, Estimated: >60 mL/min
Glucose, Bld: 204 mg/dL — ABNORMAL HIGH (ref 70–99)
Potassium: 3.5 mmol/L (ref 3.5–5.1)
Sodium: 140 mmol/L (ref 135–145)

## 2024-08-23 LAB — CBC
HCT: 37.9 % (ref 36.0–46.0)
Hemoglobin: 12.3 g/dL (ref 12.0–15.0)
MCH: 30.7 pg (ref 26.0–34.0)
MCHC: 32.5 g/dL (ref 30.0–36.0)
MCV: 94.5 fL (ref 80.0–100.0)
Platelets: 308 K/uL (ref 150–400)
RBC: 4.01 MIL/uL (ref 3.87–5.11)
RDW: 13.6 % (ref 11.5–15.5)
WBC: 9.4 K/uL (ref 4.0–10.5)
nRBC: 0 % (ref 0.0–0.2)

## 2024-08-23 LAB — GLUCOSE, CAPILLARY
Glucose-Capillary: 177 mg/dL — ABNORMAL HIGH (ref 70–99)
Glucose-Capillary: 183 mg/dL — ABNORMAL HIGH (ref 70–99)
Glucose-Capillary: 205 mg/dL — ABNORMAL HIGH (ref 70–99)
Glucose-Capillary: 203 mg/dL — ABNORMAL HIGH (ref 70–99)

## 2024-08-23 LAB — PROTIME-INR
INR: 1.2 (ref 0.8–1.2)
Prothrombin Time: 16 s — ABNORMAL HIGH (ref 11.4–15.2)

## 2024-08-23 MED ORDER — HYDRALAZINE HCL 20 MG/ML IJ SOLN
10.0000 mg | INTRAMUSCULAR | Status: DC | PRN
  Administered 2024-08-29: 10 mg via INTRAVENOUS
  Filled 2024-08-23: qty 1

## 2024-08-23 MED ORDER — LABETALOL HCL 5 MG/ML IV SOLN
10.0000 mg | INTRAVENOUS | Status: DC | PRN
  Administered 2024-08-25 – 2024-08-27 (×3): 10 mg via INTRAVENOUS
  Filled 2024-08-23 (×4): qty 4

## 2024-08-23 NOTE — Plan of Care (Signed)

## 2024-08-23 NOTE — Progress Notes (Signed)
 " Progress Note    [name] [name]   FMW:[id number]  DOB: [birth year]  DOA: 08/21/2024     0 PCP: Arloa Jarvis, NP  Initial CC: Fall at home  Hospital Course: [name] [name] is an 83 y.o. female past medical history significant for essential hypertension, diabetes mellitus type 2, HFpEF, history of CVA, history of DVT, paroxysmal atrial fibrillation on Coumadin  comes in for acute encephalopathy, daughter-in-law provides most of the history relates she has been intermittently confused and unsteady on her feet with a fall on 08/14/2024.  Was recently started on tramadol  after a fall sustained on 12/12 and now returning after another fall on 12/19.  She has recently relocated and in the process of moving in with one of her daughters, [name].  She reported that she was on a stepstool/ladder and lost her footing and fell off.  Denied any dizziness prior to the fall and no loss of consciousness after the fall.    Assessment/Plan:    Acute metabolic encephalopathy - Likely due to polypharmacy and specific tramadol  which was recently started -CT head shows right parietal scalp hematoma measuring approximately 2.6 x 2.7 x 1.5 cm - Mentation has since were improved back to normal - UA negative, ammonia very mildly elevated (43 initially) and treated with lactulose , then normalized - TSH normal - Avoiding sedating and psychotropic medications going forward   Fall Left rectus sheath hematoma Right parietal scalp hematoma - INR elevated on admission 5.8 with peak at 6.1 and has undergone treatment with vitamin K  and since downtrended - INR 1.2 this morning -Imaging studies notable for hematoma involving left rectus sheath measuring 3.6 x 2.4 cm with possible tiny focus of active bleeding on admission.  Right parietal scalp hematoma noted -Plavix  and Coumadin  held on admission - Discussing further with family regarding initiating her back on Coumadin  given fall history  PAF - On Coumadin  at  home with supratherapeutic INR on admission which was reversed - Rate controlled on Lopressor ; currently on hold - HAS-BLED = 4 points (high risk for major bleeding) (HTN, labile INR, age, high risk meds) - CHA2DS2-VASc = 6 points (9.7% risk per year) (age, sex, HTN, vascular disease, DM)   AKI -resolved - baseline creatinine of 0.8, on admission 1.32 - s/p IVF with normalized creat   Hypovolemic hyponatremia -resolved Sodium and chloride are improving continue IV fluids for an additional 24 hours.   Type 2 diabetes mellitus -Last A1c 7.3% on 07/19/2024 Continue sliding scale insulin    CAD - At home on Plavix , statin, metoprolol    Depression/anxiety Continue Effexor    Possible obstructive sleep apnea Continue CPAP at night.  Sleep study as an outpatient recommended She is becoming hypoxic during sleep  Interval History:  No events overnight.  Resting comfortably in bed. If she could decide, she states her wish would be to go home after the hospital and not to rehab.  Called and discussed with [facility] regarding disposition planning.  More conversation still to be had, notably with [name] as well whom patient is in the process of moving in with.  Antimicrobials:   DVT prophylaxis:  Place and maintain sequential compression device Start: 08/21/24 2310   Code Status:   Code Status: Limited: Do not attempt resuscitation (DNR) -DNR-LIMITED -Do Not Intubate/DNI   Mobility Assessment (Last 72 Hours)     Mobility Assessment     Row Name 08/22/24 2203 08/22/24 1226 08/21/24 23:15:57       Does the patient have exclusion criteria?  No- Perform mobility assessment No- Perform mobility assessment No- Perform mobility assessment     What is the highest level of mobility based on the mobility assessment? Level 4 (Ambulates with assistance) - Balance while stepping forward/back - Complete Level 4 (Ambulates with assistance) - Balance while stepping forward/back - Complete Level 4  (Ambulates with assistance) - Balance while stepping forward/back - Complete        Diet: Diet Orders (From admission, onward)     Start     Ordered   08/21/24 2246  Diet regular Room service appropriate? Yes; Fluid consistency: Thin  Diet effective now       Question Answer Comment  Room service appropriate? Yes   Fluid consistency: Thin      08/21/24 2249            Barriers to discharge: None Disposition Plan: Home versus SNF HH orders placed: N/A Status is: Observation  Objective: Blood pressure (!) 195/82, pulse 81, temperature 98.8 F (37.1 C), temperature source Oral, resp. rate 18, height 5' (1.524 m), weight 51.6 kg, SpO2 93%.  Examination:  Physical Exam Constitutional:      Appearance: Normal appearance.  HENT:     Head: Normocephalic and atraumatic.     Comments: Bruising around right eye extending down [place] to her right neck    Mouth/Throat:     Mouth: Mucous membranes are moist.  Eyes:     Extraocular Movements: Extraocular movements intact.  Cardiovascular:     Rate and Rhythm: Normal rate and regular rhythm.  Pulmonary:     Effort: Pulmonary effort is normal. No respiratory distress.     Breath sounds: Normal breath sounds. No wheezing.  Abdominal:     General: Bowel sounds are normal. There is no distension.     Palpations: Abdomen is soft.     Tenderness: There is no abdominal tenderness.     Comments: Bruising noted along lower middle/left sided abdomen  Musculoskeletal:        General: Normal range of motion.     Cervical back: Normal range of motion and neck supple.  Skin:    General: Skin is warm and dry.  Neurological:     General: No focal deficit present.     Mental Status: She is alert.  Psychiatric:        Mood and Affect: Mood normal.      Consultants:    Procedures:    Data Reviewed: Results for orders placed or performed during the hospital encounter of 08/21/24 (from the past 24 hours)  Glucose, capillary      Status: Abnormal   Collection Time: 08/22/24 11:39 AM  Result Value Ref Range   Glucose-Capillary 173 (H) 70 - 99 mg/dL  Glucose, capillary     Status: None   Collection Time: 08/22/24  5:09 PM  Result Value Ref Range   Glucose-Capillary 88 70 - 99 mg/dL  Glucose, capillary     Status: Abnormal   Collection Time: 08/22/24  8:53 PM  Result Value Ref Range   Glucose-Capillary 156 (H) 70 - 99 mg/dL  Basic metabolic panel     Status: Abnormal   Collection Time: 08/23/24  5:46 AM  Result Value Ref Range   Sodium 140 135 - 145 mmol/L   Potassium 3.5 3.5 - 5.1 mmol/L   Chloride 103 98 - 111 mmol/L   CO2 26 22 - 32 mmol/L   Glucose, Bld 204 (H) 70 - 99 mg/dL   BUN 10  8 - 23 mg/dL   Creatinine, Ser 9.39 0.44 - 1.00 mg/dL   Calcium  8.9 8.9 - 10.3 mg/dL   GFR, Estimated >39 >39 mL/min   Anion gap 11 5 - 15  CBC     Status: None   Collection Time: 08/23/24  5:46 AM  Result Value Ref Range   WBC 9.4 4.0 - 10.5 K/uL   RBC 4.01 3.87 - 5.11 MIL/uL   Hemoglobin 12.3 12.0 - 15.0 g/dL   HCT 62.0 63.9 - 53.9 %   MCV 94.5 80.0 - 100.0 fL   MCH 30.7 26.0 - 34.0 pg   MCHC 32.5 30.0 - 36.0 g/dL   RDW 86.3 88.4 - 84.4 %   Platelets 308 150 - 400 K/uL   nRBC 0.0 0.0 - 0.2 %  Protime-INR     Status: Abnormal   Collection Time: 08/23/24  5:46 AM  Result Value Ref Range   Prothrombin Time 16.0 (H) 11.4 - 15.2 seconds   INR 1.2 0.8 - 1.2  Ammonia     Status: None   Collection Time: 08/23/24  5:46 AM  Result Value Ref Range   Ammonia 32 9 - 35 umol/L  Glucose, capillary     Status: Abnormal   Collection Time: 08/23/24  8:07 AM  Result Value Ref Range   Glucose-Capillary 177 (H) 70 - 99 mg/dL   Comment 1 Notify RN     I have reviewed pertinent nursing notes, vitals, labs, and images as necessary. I have ordered labwork to follow up on as indicated.  I have reviewed the last notes from staff over past 24 hours. I have discussed patient's care plan and test results with nursing staff, CM/SW,  and other staff as appropriate.  Old records reviewed in assessment of this patient  Time spent: Greater than 50% of the 55 minute visit was spent in counseling/coordination of care for the patient as laid out in the A&P.   LOS: 0 days   Alm Apo, MD [facility] 08/23/2024, 11:02 AM "

## 2024-08-23 NOTE — Progress Notes (Signed)
" °   08/23/24 2233  BiPAP/CPAP/SIPAP  BiPAP/CPAP/SIPAP Pt Type Adult  BiPAP/CPAP/SIPAP Resmed  Reason BIPAP/CPAP not in use Non-compliant (pt does not want to wear..machine at beside in case she changes her mind)  BiPAP/CPAP /SiPAP Vitals  Resp (!) 23  MEWS Score/Color  MEWS Score 1  MEWS Score Color Green    "

## 2024-08-23 NOTE — Evaluation (Signed)
 Physical Therapy Evaluation Patient Details Name: [name] [name] MRN: [id number] DOB: 08-25-1941 Today's Date: 08/23/2024  History of Present Illness  [name] [name] is a 83 y.o. female with medical history significant for hypertension, hyperlipidemia, type 2 diabetes mellitus, CAD, HFpEF, history of CVA, history of DVT, and PAF on warfarin who presents for evaluation of confusion. Patient was brought into the ED by her daughter-in-law who reports that the patient has been intermittently confused and unsteady on her feet ever since her fall on 08/14/2024 for which she was evaluated in the ED  Clinical Impression  Pt admitted as above and presenting with functional mobility limitations 2* generalized weakness, ambulatory balance deficits, and cognition (pt with poor insight into deficits); hx of multiple falls. PTA pt lives alone in an apartment with 2 flights of stairs; flight of stairs to get to bathroom per pt's son and DIL. Pt has cane and RW but pt will not use them per DIL and has had multiple falls last 3 months with cognitive impairments that DIL says that pt does not acknowledge. Pt states she plans to dc with dtr who is a nurse but DIL reveals that dtr works two jobs and a single mother so ability to assist is limited.  Pt currently requires stabilizing assist for transfers OOB and ambulation  and is at risk for additional falls.    DIL on phone with RN to provide additional information and  expressing concern of pt returning home alone. Patient will benefit from continued inpatient follow up therapy, <3 hours/day to maximize IND and safety for return home unless family is able to provide 24/7 assist.      If plan is discharge home, recommend the following: A little help with walking and/or transfers;A little help with bathing/dressing/bathroom;Assistance with cooking/housework;Assist for transportation;Help with stairs or ramp for entrance   Can travel by private vehicle   Yes     Equipment Recommendations None recommended by PT  Recommendations for Other Services       Functional Status Assessment Patient has had a recent decline in their functional status and demonstrates the ability to make significant improvements in function in a reasonable and predictable amount of time.     Precautions / Restrictions Precautions Precautions: Fall Recall of Precautions/Restrictions: Impaired Precaution/Restrictions Comments: poor insight into deficitis, Poor safety awareness Restrictions Weight Bearing Restrictions Per Provider Order: No      Mobility  Bed Mobility Overal bed mobility: Modified Independent             General bed mobility comments: Pt unassisted supine<>sit    Transfers Overall transfer level: Needs assistance Equipment used: Rolling walker (2 wheels) Transfers: Sit to/from Stand, Bed to chair/wheelchair/BSC Sit to Stand: Contact guard assist           General transfer comment: Steady assist for balance    Ambulation/Gait Ambulation/Gait assistance: Min assist, Contact guard assist Gait Distance (Feet): 250 Feet (and 15' twice to/from bathroom) Assistive device: Rolling walker (2 wheels), None Gait Pattern/deviations: Step-through pattern, Shuffle, Staggering left, Staggering right       General Gait Details: intermittent mild instability requiring assist to stablize vs reaching for wall.  Improvement with use of RW  Stairs            Wheelchair Mobility     Tilt Bed    Modified Rankin (Stroke Patients Only)       Balance Overall balance assessment: Needs assistance Sitting-balance support: No upper extremity supported, Feet supported Sitting balance-Leahy  Scale: Good     Standing balance support: No upper extremity supported Standing balance-Leahy Scale: Fair                               Pertinent Vitals/Pain Pain Assessment Pain Assessment: Faces Faces Pain Scale: Hurts little more Pain  Location: L shoulder Pain Descriptors / Indicators: Sore Pain Intervention(s): Monitored during session, Limited activity within patient's tolerance    Home Living Family/patient expects to be discharged to:: Private residence Living Arrangements: Children Available Help at Discharge: Family;Available PRN/intermittently Type of Home: House Home Access: Stairs to enter Entrance Stairs-Rails: None Entrance Stairs-Number of Steps: 2 flights (steep flights per daughter in social worker)   Home Layout: Bed/bath upstairs;Two level Home Equipment: Cane - single point;Rollator (4 wheels) Additional Comments: DIL states that pt does not use her RW, has has 5+ falls in past 6 months; mutiple falls/injuries last 3 years.  Pt reports plans to live with dtr - DIL reveals dtr works two jobs and has children so limited availability to assist    Prior Function Prior Level of Function : Needs assist             Mobility Comments: has RW and cane, but does not use them       Extremity/Trunk Assessment   Upper Extremity Assessment Upper Extremity Assessment: Generalized weakness LUE Deficits / Details: L shoulder AROM impaired, 0-75 degrees FF. pt reports ROM impaired prior but more since fall    Lower Extremity Assessment Lower Extremity Assessment: Generalized weakness    Cervical / Trunk Assessment Cervical / Trunk Assessment: Kyphotic  Communication   Communication Communication: No apparent difficulties    Cognition Arousal: Alert Behavior During Therapy: WFL for tasks assessed/performed                             Following commands: Impaired Following commands impaired: Only follows one step commands consistently, Follows multi-step commands with increased time     Cueing Cueing Techniques: Verbal cues, Gestural cues, Visual cues     General Comments      Exercises     Assessment/Plan    PT Assessment Patient needs continued PT services  PT Problem List  Decreased strength;Decreased range of motion;Decreased activity tolerance;Decreased balance;Decreased mobility;Decreased knowledge of use of DME;Decreased safety awareness       PT Treatment Interventions DME instruction;Gait training;Stair training;Functional mobility training;Therapeutic activities;Therapeutic exercise;Balance training;Patient/family education    PT Goals (Current goals can be found in the Care Plan section)  Acute Rehab PT Goals Patient Stated Goal: Regain IND PT Goal Formulation: With patient Time For Goal Achievement: 09/06/24 Potential to Achieve Goals: Good    Frequency Min 3X/week     Co-evaluation               AM-PAC PT 6 Clicks Mobility  Outcome Measure Help needed turning from your back to your side while in a flat bed without using bedrails?: None Help needed moving from lying on your back to sitting on the side of a flat bed without using bedrails?: None Help needed moving to and from a bed to a chair (including a wheelchair)?: A Little Help needed standing up from a chair using your arms (e.g., wheelchair or bedside chair)?: A Little Help needed to walk in hospital room?: A Little Help needed climbing 3-5 steps with a railing? : A Lot 6 Click Score:  19    End of Session Equipment Utilized During Treatment: Gait belt Activity Tolerance: Patient tolerated treatment well Patient left: in bed;with call bell/phone within reach;with bed alarm set Nurse Communication: Mobility status PT Visit Diagnosis: Difficulty in walking, not elsewhere classified (R26.2);Muscle weakness (generalized) (M62.81)    Time: 8966-8889 PT Time Calculation (min) (ACUTE ONLY): 37 min   Charges:   PT Evaluation $PT Eval Low Complexity: 1 Low PT Treatments $Gait Training: 8-22 mins PT General Charges $$ ACUTE PT VISIT: 1 Visit         [facility] PT Acute Rehabilitation Services Office [phone]   Daejah Klebba 08/23/2024, 1:29 PM

## 2024-08-23 NOTE — Hospital Course (Addendum)
 [name] [name] is an 83 y.o. female past medical history significant for essential hypertension, diabetes mellitus type 2, HFpEF, history of CVA, history of DVT, paroxysmal atrial fibrillation on Coumadin  comes in for acute encephalopathy, daughter-in-law provides most of the history relates she has been intermittently confused and unsteady on her feet with a fall on 08/14/2024.  Was recently started on tramadol  after a fall sustained on 12/12 and now returning after another fall on 12/19.  She has recently relocated and in the process of moving in with one of her daughters, [name] [name].  She reported that she was on a stepstool/ladder and lost her footing and fell off.  Denied any dizziness prior to the fall and no loss of consciousness after the fall.   Assessment/Plan:    Acute metabolic encephalopathy - resolved  - Likely due to polypharmacy and specific tramadol  which was recently started -CT head shows right parietal scalp hematoma measuring approximately 2.6 x 2.7 x 1.5 cm - Mentation has since were improved back to normal - UA negative, ammonia very mildly elevated (43 initially) and treated with lactulose , then normalized - TSH normal - Avoiding sedating and psychotropic medications going forward   Fall Left rectus sheath hematoma Right parietal scalp hematoma - INR elevated on admission 5.8 with peak at 6.1 and has undergone treatment with vitamin K  and since downtrended - INR 1.2 this morning -Imaging studies notable for hematoma involving left rectus sheath measuring 3.6 x 2.4 cm with possible tiny focus of active bleeding on admission.  Right parietal scalp hematoma noted -Plavix  and Coumadin  held on admission; ultimately resumed   PAF Supratherapeutic INR - On Coumadin  at home with supratherapeutic INR on admission which was reversed - Rate controlled on Lopressor ; currently on hold - HAS-BLED = 4 points (high risk for major bleeding) (HTN, labile INR, age, high risk meds) -  CHA2DS2-VASc = 6 points (9.7% risk per year) (age, sex, HTN, vascular disease, DM) - Family has discussed and are amenable with resuming Coumadin  at this time; reordered on 08/24/2024 - No dosage change recommended per pharmacy at discharge.  Supratherapeutic INR possibly in setting of recent Levaquin  use -Will need ongoing monitoring of INR at discharge   AKI -resolved - baseline creatinine of 0.8, on admission 1.32 - s/p IVF with normalized creat   Hypovolemic hyponatremia -resolved Sodium and chloride are improving continue IV fluids for an additional 24 hours.   Type 2 diabetes mellitus -Last A1c 7.3% on 07/19/2024 Continue sliding scale insulin    CAD - At home on Plavix , statin, metoprolol    Depression/anxiety Continue Effexor    Possible obstructive sleep apnea Continue CPAP at night.  Sleep study as an outpatient recommended She is becoming hypoxic during sleep

## 2024-08-23 NOTE — Plan of Care (Signed)
°  Problem: Clinical Measurements: Goal: Ability to maintain clinical measurements within normal limits will improve Outcome: Progressing   Problem: Clinical Measurements: Goal: Will remain free from infection Outcome: Progressing   Problem: Clinical Measurements: Goal: Respiratory complications will improve Outcome: Progressing   Problem: Elimination: Goal: Will not experience complications related to bowel motility Outcome: Progressing

## 2024-08-24 ENCOUNTER — Telehealth (HOSPITAL_COMMUNITY): Payer: Self-pay

## 2024-08-24 ENCOUNTER — Other Ambulatory Visit (HOSPITAL_COMMUNITY): Payer: Self-pay

## 2024-08-24 DIAGNOSIS — G9341 Metabolic encephalopathy: Secondary | ICD-10-CM | POA: Diagnosis not present

## 2024-08-24 DIAGNOSIS — W19XXXA Unspecified fall, initial encounter: Secondary | ICD-10-CM | POA: Diagnosis not present

## 2024-08-24 DIAGNOSIS — S0003XD Contusion of scalp, subsequent encounter: Secondary | ICD-10-CM | POA: Diagnosis not present

## 2024-08-24 DIAGNOSIS — S3011XA Contusion of abdominal wall, initial encounter: Secondary | ICD-10-CM | POA: Diagnosis not present

## 2024-08-24 LAB — BASIC METABOLIC PANEL WITH GFR
Anion gap: 9 (ref 5–15)
BUN: 9 mg/dL (ref 8–23)
CO2: 30 mmol/L (ref 22–32)
Calcium: 9 mg/dL (ref 8.9–10.3)
Chloride: 95 mmol/L — ABNORMAL LOW (ref 98–111)
Creatinine, Ser: 0.68 mg/dL (ref 0.44–1.00)
GFR, Estimated: >60 mL/min
Glucose, Bld: 174 mg/dL — ABNORMAL HIGH (ref 70–99)
Potassium: 3.4 mmol/L — ABNORMAL LOW (ref 3.5–5.1)
Sodium: 134 mmol/L — ABNORMAL LOW (ref 135–145)

## 2024-08-24 LAB — CBC
HCT: 38 % (ref 36.0–46.0)
Hemoglobin: 12.5 g/dL (ref 12.0–15.0)
MCH: 30.7 pg (ref 26.0–34.0)
MCHC: 32.9 g/dL (ref 30.0–36.0)
MCV: 93.4 fL (ref 80.0–100.0)
Platelets: 321 K/uL (ref 150–400)
RBC: 4.07 MIL/uL (ref 3.87–5.11)
RDW: 13.5 % (ref 11.5–15.5)
WBC: 10.8 K/uL — ABNORMAL HIGH (ref 4.0–10.5)
nRBC: 0 % (ref 0.0–0.2)

## 2024-08-24 LAB — GLUCOSE, CAPILLARY
Glucose-Capillary: 224 mg/dL — ABNORMAL HIGH (ref 70–99)
Glucose-Capillary: 170 mg/dL — ABNORMAL HIGH (ref 70–99)
Glucose-Capillary: 206 mg/dL — ABNORMAL HIGH (ref 70–99)
Glucose-Capillary: 122 mg/dL — ABNORMAL HIGH (ref 70–99)

## 2024-08-24 LAB — PROTIME-INR
INR: 1.2 (ref 0.8–1.2)
Prothrombin Time: 15.6 s — ABNORMAL HIGH (ref 11.4–15.2)

## 2024-08-24 MED ORDER — WARFARIN SODIUM 5 MG PO TABS
7.5000 mg | ORAL_TABLET | Freq: Once | ORAL | Status: AC
  Administered 2024-08-24: 7.5 mg via ORAL
  Filled 2024-08-24: qty 1

## 2024-08-24 MED ORDER — POTASSIUM CHLORIDE CRYS ER 20 MEQ PO TBCR
40.0000 meq | EXTENDED_RELEASE_TABLET | Freq: Once | ORAL | Status: AC
  Administered 2024-08-24: 40 meq via ORAL
  Filled 2024-08-24: qty 2

## 2024-08-24 MED ORDER — WARFARIN - PHARMACIST DOSING INPATIENT: Freq: Every day | Status: DC

## 2024-08-24 NOTE — Plan of Care (Signed)
" °  Problem: Education: Goal: Knowledge of General Education information will improve Description: Including pain rating scale, medication(s)/side effects and non-pharmacologic comfort measures Outcome: Progressing   Problem: Clinical Measurements: Goal: Ability to maintain clinical measurements within normal limits will improve Outcome: Progressing Goal: Will remain free from infection Outcome: Progressing Goal: Diagnostic test results will improve Outcome: Progressing   Problem: Nutrition: Goal: Adequate nutrition will be maintained Outcome: Progressing   Problem: Coping: Goal: Level of anxiety will decrease Outcome: Progressing   Problem: Elimination: Goal: Will not experience complications related to urinary retention Outcome: Progressing   Problem: Pain Managment: Goal: General experience of comfort will improve and/or be controlled Outcome: Progressing   Problem: Safety: Goal: Ability to remain free from injury will improve Outcome: Progressing   Problem: Skin Integrity: Goal: Risk for impaired skin integrity will decrease Outcome: Progressing   Problem: Education: Goal: Ability to describe self-care measures that may prevent or decrease complications (Diabetes Survival Skills Education) will improve Outcome: Progressing   Problem: Metabolic: Goal: Ability to maintain appropriate glucose levels will improve Outcome: Progressing   Problem: Nutritional: Goal: Maintenance of adequate nutrition will improve Outcome: Progressing   Problem: Skin Integrity: Goal: Risk for impaired skin integrity will decrease Outcome: Progressing   "

## 2024-08-24 NOTE — Progress Notes (Signed)
" °   08/24/24 2319  BiPAP/CPAP/SIPAP  BiPAP/CPAP/SIPAP Resmed  Reason BIPAP/CPAP not in use Non-compliant    "

## 2024-08-24 NOTE — Progress Notes (Signed)
 " Progress Note    [name] [name]   FMW:[id number]  DOB: Jul 03, 1941  DOA: 08/21/2024     0 PCP: Arloa Jarvis, NP  Initial CC: Fall at home  Hospital Course: [name] [name] is an 83 y.o. female past medical history significant for essential hypertension, diabetes mellitus type 2, HFpEF, history of CVA, history of DVT, paroxysmal atrial fibrillation on Coumadin  comes in for acute encephalopathy, daughter-in-law provides most of the history relates she has been intermittently confused and unsteady on her feet with a fall on 08/14/2024.  Was recently started on tramadol  after a fall sustained on 12/12 and now returning after another fall on 12/19.  She has recently relocated and in the process of moving in with one of her daughters, [name].  She reported that she was on a stepstool/ladder and lost her footing and fell off.  Denied any dizziness prior to the fall and no loss of consciousness after the fall.   Assessment/Plan:    Acute metabolic encephalopathy - resolved  - Likely due to polypharmacy and specific tramadol  which was recently started -CT head shows right parietal scalp hematoma measuring approximately 2.6 x 2.7 x 1.5 cm - Mentation has since were improved back to normal - UA negative, ammonia very mildly elevated (43 initially) and treated with lactulose , then normalized - TSH normal - Avoiding sedating and psychotropic medications going forward   Fall Left rectus sheath hematoma Right parietal scalp hematoma - INR elevated on admission 5.8 with peak at 6.1 and has undergone treatment with vitamin K  and since downtrended - INR 1.2 this morning -Imaging studies notable for hematoma involving left rectus sheath measuring 3.6 x 2.4 cm with possible tiny focus of active bleeding on admission.  Right parietal scalp hematoma noted -Plavix  and Coumadin  held on admission  PAF Supratherapeutic INR - On Coumadin  at home with supratherapeutic INR on admission which was reversed -  Rate controlled on Lopressor ; currently on hold - HAS-BLED = 4 points (high risk for major bleeding) (HTN, labile INR, age, high risk meds) - CHA2DS2-VASc = 6 points (9.7% risk per year) (age, sex, HTN, vascular disease, DM) - Family has discussed and are amenable with resuming Coumadin  at this time; reordered on 08/24/2024   AKI -resolved - baseline creatinine of 0.8, on admission 1.32 - s/p IVF with normalized creat   Hypovolemic hyponatremia -resolved Sodium and chloride are improving continue IV fluids for an additional 24 hours.   Type 2 diabetes mellitus -Last A1c 7.3% on 07/19/2024 Continue sliding scale insulin    CAD - At home on Plavix , statin, metoprolol    Depression/anxiety Continue Effexor    Possible obstructive sleep apnea Continue CPAP at night.  Sleep study as an outpatient recommended She is becoming hypoxic during sleep  Interval History:  No events overnight.  Resting comfortably in bed. Rosaline present bedside this morning.  Also called and updated Rojelio this afternoon. Overall patient is doing well and has improved.  Working with physical therapy and we are now pursuing placement. Family has also discussed and in agreement with resuming Coumadin .  Price checked Eliquis  but unfortunately high deductible prevents use.   Antimicrobials:   DVT prophylaxis:  Place and maintain sequential compression device Start: 08/21/24 2310 warfarin (COUMADIN ) tablet 7.5 mg   Code Status:   Code Status: Limited: Do not attempt resuscitation (DNR) -DNR-LIMITED -Do Not Intubate/DNI   Mobility Assessment (Last 72 Hours)     Mobility Assessment     Row Name 08/24/24 0823 08/24/24 0700 08/23/24  2115 08/23/24 1500 08/23/24 1300   Does the patient have exclusion criteria? No- Perform mobility assessment No- Perform mobility assessment No- Perform mobility assessment No- Perform mobility assessment --   What is the highest level of mobility based on the mobility assessment?  Level 4 (Ambulates with assistance) - Balance while stepping forward/back - Complete Level 4 (Ambulates with assistance) - Balance while stepping forward/back - Complete Level 4 (Ambulates with assistance) - Balance while stepping forward/back - Complete Level 4 (Ambulates with assistance) - Balance while stepping forward/back - Complete Level 4 (Ambulates with assistance) - Balance while stepping forward/back - Complete    Row Name 08/22/24 2203 08/22/24 1226 08/21/24 23:15:57       Does the patient have exclusion criteria? No- Perform mobility assessment No- Perform mobility assessment No- Perform mobility assessment     What is the highest level of mobility based on the mobility assessment? Level 4 (Ambulates with assistance) - Balance while stepping forward/back - Complete Level 4 (Ambulates with assistance) - Balance while stepping forward/back - Complete Level 4 (Ambulates with assistance) - Balance while stepping forward/back - Complete        Diet: Diet Orders (From admission, onward)     Start     Ordered   08/21/24 2246  Diet regular Room service appropriate? Yes; Fluid consistency: Thin  Diet effective now       Question Answer Comment  Room service appropriate? Yes   Fluid consistency: Thin      08/21/24 2249            Barriers to discharge: None Disposition Plan:  SNF HH orders placed: N/A Status is: Observation  Objective: Blood pressure (!) 155/84, pulse 71, temperature 98.7 F (37.1 C), resp. rate 16, height 5' (1.524 m), weight 49.3 kg, SpO2 93%.  Examination:  Physical Exam Constitutional:      Appearance: Normal appearance.  HENT:     Head: Normocephalic and atraumatic.     Comments: Bruising around right eye extending down [place] to her right neck    Mouth/Throat:     Mouth: Mucous membranes are moist.  Eyes:     Extraocular Movements: Extraocular movements intact.  Cardiovascular:     Rate and Rhythm: Normal rate and regular rhythm.  Pulmonary:      Effort: Pulmonary effort is normal. No respiratory distress.     Breath sounds: Normal breath sounds. No wheezing.  Abdominal:     General: Bowel sounds are normal. There is no distension.     Palpations: Abdomen is soft.     Tenderness: There is no abdominal tenderness.     Comments: Bruising noted along lower middle/left sided abdomen  Musculoskeletal:        General: Normal range of motion.     Cervical back: Normal range of motion and neck supple.  Skin:    General: Skin is warm and dry.  Neurological:     General: No focal deficit present.     Mental Status: She is alert.  Psychiatric:        Mood and Affect: Mood normal.      Consultants:    Procedures:    Data Reviewed: Results for orders placed or performed during the hospital encounter of 08/21/24 (from the past 24 hours)  Glucose, capillary     Status: Abnormal   Collection Time: 08/23/24  4:51 PM  Result Value Ref Range   Glucose-Capillary 205 (H) 70 - 99 mg/dL   Comment 1 Notify RN  Glucose, capillary     Status: Abnormal   Collection Time: 08/23/24  9:26 PM  Result Value Ref Range   Glucose-Capillary 203 (H) 70 - 99 mg/dL  Basic metabolic panel     Status: Abnormal   Collection Time: 08/24/24  5:19 AM  Result Value Ref Range   Sodium 134 (L) 135 - 145 mmol/L   Potassium 3.4 (L) 3.5 - 5.1 mmol/L   Chloride 95 (L) 98 - 111 mmol/L   CO2 30 22 - 32 mmol/L   Glucose, Bld 174 (H) 70 - 99 mg/dL   BUN 9 8 - 23 mg/dL   Creatinine, Ser 9.31 0.44 - 1.00 mg/dL   Calcium  9.0 8.9 - 10.3 mg/dL   GFR, Estimated >39 >39 mL/min   Anion gap 9 5 - 15  CBC     Status: Abnormal   Collection Time: 08/24/24  5:19 AM  Result Value Ref Range   WBC 10.8 (H) 4.0 - 10.5 K/uL   RBC 4.07 3.87 - 5.11 MIL/uL   Hemoglobin 12.5 12.0 - 15.0 g/dL   HCT 61.9 63.9 - 53.9 %   MCV 93.4 80.0 - 100.0 fL   MCH 30.7 26.0 - 34.0 pg   MCHC 32.9 30.0 - 36.0 g/dL   RDW 86.4 88.4 - 84.4 %   Platelets 321 150 - 400 K/uL   nRBC 0.0 0.0 - 0.2  %  Protime-INR     Status: Abnormal   Collection Time: 08/24/24  5:19 AM  Result Value Ref Range   Prothrombin Time 15.6 (H) 11.4 - 15.2 seconds   INR 1.2 0.8 - 1.2  Glucose, capillary     Status: Abnormal   Collection Time: 08/24/24  8:11 AM  Result Value Ref Range   Glucose-Capillary 224 (H) 70 - 99 mg/dL  Glucose, capillary     Status: Abnormal   Collection Time: 08/24/24 12:04 PM  Result Value Ref Range   Glucose-Capillary 170 (H) 70 - 99 mg/dL    I have reviewed pertinent nursing notes, vitals, labs, and images as necessary. I have ordered labwork to follow up on as indicated.  I have reviewed the last notes from staff over past 24 hours. I have discussed patient's care plan and test results with nursing staff, CM/SW, and other staff as appropriate.  Old records reviewed in assessment of this patient  Time spent: Greater than 50% of the 55 minute visit was spent in counseling/coordination of care for the patient as laid out in the A&P.   LOS: 0 days   Alm Apo, MD [facility] 08/24/2024, 2:52 PM "

## 2024-08-24 NOTE — [place] FL2 (Signed)
 " [place]  MEDICAID FL2 LEVEL OF CARE FORM     IDENTIFICATION  Patient Name: [name] [name] Birthdate: 1940-09-05 Sex: female Admission Date (Current Location): 08/21/2024  [facility] and [place] Number:  Producer, Television/film/video and Address:  [facility],  [address], [place] [address]      Provider Number: [id number]  Attending Physician Name and Address:  Patsy Lenis, MD  Relative Name and Phone Number:  Edith Line (Daughter), Emergency Contact  [phone]    Current Level of Care: Hospital Recommended Level of Care: Skilled Nursing Facility Prior Approval Number:    Date Approved/Denied:   PASRR Number: [id number]  Discharge Plan: SNF    Current Diagnoses: Patient Active Problem List   Diagnosis Date Noted   Fall at home, initial encounter 08/23/2024   Scalp hematoma 08/21/2024   Rectus sheath hematoma 08/21/2024   Supratherapeutic INR 08/21/2024   Right wrist pain 07/20/2024   Hypotension 07/19/2024   T2DM (type 2 diabetes mellitus) (HCC) 07/19/2024   CAD (coronary artery disease) 07/19/2024   PAF (paroxysmal atrial fibrillation) (HCC) 07/19/2024   Scapholunate ligament injury with no instability, right, initial encounter 07/19/2024   Vasovagal syncope 07/19/2024   Atrial fibrillation (HCC) 04/14/2024   Long term (current) use of anticoagulants 04/14/2024   Enteritis 03/23/2024   Paroxysmal atrial fibrillation (HCC) 03/02/2024   Protein-calorie malnutrition, severe 02/14/2024   Sepsis due to urinary tract infection (HCC) 02/12/2024   Lethargy 02/12/2024   Cellulitis and abscess of right lower extremity 02/14/2023   Chronic diastolic CHF (congestive heart failure) (HCC) 02/14/2023   Abscess 02/13/2023   Elevated lipase 09/27/2022   Anorexia 09/27/2022   Chronic hyponatremia 09/26/2022   Left leg swelling 05/18/2022   Acute post-operative pain 05/18/2022   Multiple pulmonary nodules 03/22/2022   Encounter for general adult medical  examination with abnormal findings 10/04/2021   Hx of completed stroke 05/12/2021   Coagulation disorder 05/12/2021   Stage 3b chronic kidney disease (HCC) 09/13/2020   History of bacterial endocarditis 10/28/2018   Other seasonal allergic rhinitis 07/08/2014   Diabetic peripheral neuropathy (HCC) 06/29/2014   Essential hypertension 08/03/2009   CAD (coronary artery disease) 01/14/2008   Dizziness 10/15/2007   DM2 (diabetes mellitus, type 2) (HCC) 08/15/2007   Hyperlipidemia 06/13/2007   Depression with anxiety 06/13/2007   GERD (gastroesophageal reflux disease) 06/13/2007   Osteoarthritis 06/13/2007    Orientation RESPIRATION BLADDER Height & Weight     Self, Time, Situation, Place  Normal Continent Weight: 108 lb 11 oz (49.3 kg) Height:  5' (152.4 cm)  BEHAVIORAL SYMPTOMS/MOOD NEUROLOGICAL BOWEL NUTRITION STATUS      Continent Diet (Regular)  AMBULATORY STATUS COMMUNICATION OF NEEDS Skin   Limited Assist Verbally Normal                       Personal Care Assistance Level of Assistance  Bathing, Feeding, Dressing Bathing Assistance: Limited assistance Feeding assistance: Independent Dressing Assistance: Limited assistance     Functional Limitations Info  Sight, Hearing, Speech Sight Info: Impaired (eyeglasses) Hearing Info: Impaired Speech Info: Adequate    SPECIAL CARE FACTORS FREQUENCY  PT (By licensed PT), OT (By licensed OT)     PT Frequency: 5x per week OT Frequency: 5x per week            Contractures Contractures Info: Not present    Additional Factors Info  Code Status, Allergies Code Status Info: DNR Allergies Info: Sulfa  Antibiotics, Codeine, Loop Diuretics (Sulfonamide),  Niacin  And Related, Prednisone, Rocephin  (Ceftriaxone ), Sulfa  Antibiotics, Codeine, Macrobid  (Nitrofurantoin ), Niacin , Prednisone, Rocephin  (Ceftriaxone ), Sulfonamide Derivatives, Tetracycline, Tetracyclines & Related           Current Medications (08/24/2024):  This is  the current hospital active medication list Current Facility-Administered Medications  Medication Dose Route Frequency Provider Last Rate Last Admin   acetaminophen  (TYLENOL ) tablet 650 mg  650 mg Oral Q6H PRN Opyd, Timothy S, MD   650 mg at 08/24/24 9090   Or   acetaminophen  (TYLENOL ) suppository 650 mg  650 mg Rectal Q6H PRN Opyd, Evalene RAMAN, MD       atorvastatin  (LIPITOR ) tablet 80 mg  80 mg Oral Daily Opyd, Timothy S, MD   80 mg at 08/24/24 9091   cyanocobalamin  (VITAMIN B12) injection 1,000 mcg  1,000 mcg Subcutaneous Daily Odell Celinda Balo, MD   1,000 mcg at 08/24/24 9090   fentaNYL  (SUBLIMAZE ) injection 12.5-50 mcg  12.5-50 mcg Intravenous Q2H PRN Opyd, Timothy S, MD       gabapentin  (NEURONTIN ) capsule 200 mg  200 mg Oral BID Opyd, Timothy S, MD   200 mg at 08/24/24 9091   hydrALAZINE  (APRESOLINE ) injection 10 mg  10 mg Intravenous Q4H PRN Patsy Lenis, MD       insulin  aspart (novoLOG ) injection 0-5 Units  0-5 Units Subcutaneous QHS Opyd, Timothy S, MD   2 Units at 08/23/24 2209   insulin  aspart (novoLOG ) injection 0-6 Units  0-6 Units Subcutaneous TID WC Opyd, Timothy S, MD   1 Units at 08/24/24 1250   insulin  aspart (novoLOG ) injection 3 Units  3 Units Subcutaneous TID WC Odell Celinda Balo, MD   3 Units at 08/24/24 1251   labetalol  (NORMODYNE ) injection 10 mg  10 mg Intravenous Q4H PRN Patsy Lenis, MD       metoprolol  tartrate (LOPRESSOR ) tablet 50 mg  50 mg Oral BID Opyd, Timothy S, MD   50 mg at 08/24/24 9091   ondansetron  (ZOFRAN ) tablet 4 mg  4 mg Oral Q6H PRN Opyd, Timothy S, MD       Or   ondansetron  (ZOFRAN ) injection 4 mg  4 mg Intravenous Q6H PRN Opyd, Timothy S, MD   4 mg at 08/23/24 1706   pantoprazole  (PROTONIX ) EC tablet 40 mg  40 mg Oral BID Opyd, Timothy S, MD   40 mg at 08/24/24 9091   senna (SENOKOT) tablet 8.6 mg  1 tablet Oral Daily PRN Opyd, Evalene RAMAN, MD       sodium chloride  flush (NS) 0.9 % injection 3 mL  3 mL Intravenous Q12H Opyd, Timothy S, MD    3 mL at 08/24/24 9087   tiZANidine  (ZANAFLEX ) tablet 4 mg  4 mg Oral Q8H PRN Opyd, Timothy S, MD       venlafaxine  XR (EFFEXOR -XR) 24 hr capsule 37.5 mg  37.5 mg Oral Daily Opyd, Timothy S, MD   37.5 mg at 08/24/24 9091   warfarin (COUMADIN ) tablet 7.5 mg  7.5 mg Oral ONCE-1600 Ellington, Abby K, RPH       Warfarin - Pharmacist Dosing Inpatient   Does not apply q1600 Ellington, Abby K, RPH         Discharge Medications: Please see discharge summary for a list of discharge medications.  Relevant Imaging Results:  Relevant Lab Results:   Additional Information SSN: [id number]  Heather LABOR Tylor Gambrill, LCSW     "

## 2024-08-24 NOTE — Plan of Care (Signed)

## 2024-08-24 NOTE — TOC Progression Note (Signed)
 Transition of Care [facility]) - Progression Note    Patient Details  Name: [name] [name] MRN: [id number] Date of Birth: 08-23-1941  Transition of Care [facility]) CM/SW Contact  Heather DELENA Saltness, LCSW Phone Number: 08/24/2024, 1:13 PM  Clinical Narrative:    CSW spoke with pt's daughter, Rojelio Core [phone], via phone call to discuss PT's recommendation for short-term rehab upon discharge. Pt's daughter agreeable with recommendation. Pt's daughter reports preferred facilities of [address] and [facility]. CSW completed FL2 and sent referrals to SNF locations in [place] and surrounding areas. Awaiting bed offers. TOC will continue to follow.  Expected Discharge Plan: Home w Home Health Services Barriers to Discharge: Continued Medical Work up  Expected Discharge Plan and Services   Discharge Planning Services: CM Consult   Living arrangements for the past 2 months: Apartment                 DME Arranged: N/A DME Agency: NA       HH Arranged: NA HH Agency: NA         Social Drivers of Health (SDOH) Interventions SDOH Screenings   Food Insecurity: No Food Insecurity (08/22/2024)  Housing: Low Risk (08/22/2024)  Transportation Needs: No Transportation Needs (08/22/2024)  Utilities: Not At Risk (08/22/2024)  Alcohol  Screen: Low Risk (11/03/2021)  Depression (PHQ2-9): Low Risk (05/18/2022)  Financial Resource Strain: Low Risk (11/03/2021)  Physical Activity: Sufficiently Active (11/03/2021)  Social Connections: Moderately Isolated (08/22/2024)  Stress: No Stress Concern Present (11/03/2021)  Tobacco Use: Low Risk (08/21/2024)    Readmission Risk Interventions     No data to display         Signed: Heather Saltness, MSW, LCSW Clinical Social Worker Inpatient Care Management 08/24/2024 1:15 PM

## 2024-08-24 NOTE — Progress Notes (Signed)
 Mobility Specialist Progress Note:   08/24/24 1518  Mobility  Activity Ambulated with assistance (Simultaneous filing. User may not have seen previous data.)  Level of Assistance Contact guard assist, steadying assist  Assistive Device Four wheel walker  Distance Ambulated (ft) 310 ft  Activity Response Tolerated well  Mobility Referral Yes  Mobility visit 1 Mobility  Mobility Specialist Start Time (ACUTE ONLY) 1450  Mobility Specialist Stop Time (ACUTE ONLY) 1500  Mobility Specialist Time Calculation (min) (ACUTE ONLY) 10 min   Pt was received in bed and agreed to mobility. No complaints during ambulation. Returned to bed with all needs met. Call bell in reach.  [employer] - Mobility Specialist

## 2024-08-24 NOTE — Telephone Encounter (Signed)
 Pharmacy Patient Advocate Encounter  Insurance verification completed.    The patient is insured through [employer]. Patient has Medicare and is not eligible for a copay card, but may be able to apply for patient assistance or Medicare RX Payment Plan (Patient Must reach out to their plan, if eligible for payment plan), if available.    Ran test claim for Eliquis 5mg  tablet and the current 30 day co-pay is $47.   This test claim was processed through [employer]- copay amounts may vary at other pharmacies due to [employer], or as the patient moves through the different stages of their insurance plan.

## 2024-08-24 NOTE — Progress Notes (Signed)
 PHARMACY - ANTICOAGULATION CONSULT NOTE  Pharmacy Consult for warfarin Indication: atrial fibrillation  Allergies[1]  Patient Measurements: Height: 5' (152.4 cm) Weight: 49.3 kg (108 lb 11 oz) IBW/kg (Calculated) : 45.5 HEPARIN  DW (KG): 54.5  Vital Signs: Temp: 98.6 F (37 C) (12/22 0455) Temp Source: Oral (12/22 0455) BP: 160/78 (12/22 0455) Pulse Rate: 82 (12/22 0455)  Labs: Recent Labs    08/22/24 0002 08/22/24 0819 08/23/24 0546 08/24/24 0519  HGB 10.5*  --  12.3 12.5  HCT 31.9*  --  37.9 38.0  PLT 217  --  308 321  LABPROT 56.9* 37.0* 16.0* 15.6*  INR 6.1* 3.5* 1.2 1.2  CREATININE 1.02*  --  0.60 0.68    Estimated Creatinine Clearance: 37.6 mL/min (by C-G formula based on SCr of 0.68 mg/dL).   Medical History: Past Medical History:  Diagnosis Date   Allergy    Anal fissure    Anxiety    CAD (coronary artery disease)    s/p inf MI 2007 - tx w/ DES to RCA // s/p POBA to [facility] and DES to dRCA in 11/18 ([facility] in [place], [place]) // Myoview  3/21: low risk // s/p DES to pRCA   Cataract    removed both eyes   Cerebrovascular disease    Carotid US  09/2017 [facility]): mild plaque (<50%) in both carotid arteries   Chronic neck pain    Chronic pain syndrome    Diabetes Mellitus, Type 2    Diverticular disease    DJD (degenerative joint disease)    Echocardiogram 10/2018    Echo 10/2018: EF 55-60, normal RVSF, mod MAC, mod TR, severe AoV calcification and sclerosis with nodular calcium /mobile area of calcium  in the LVOT (small veg vs Lambl's excrescence  - consider TEE), mild AI, mild AS (mean 11).    Echocardiogram 04/2019    Echocardiogram 04/2019: EF 60-65, basal septal hypertrophy, grade 2 diastolic dysfunction, normal wall motion, normal RV SF, mild LAE, mod MAC, trivial MR, mod sclerosis of the aortic valve with mod aortic annular calcification, thin mobile filamentous structure on ventricular side of AV likely representing Lambl's excrescence, mild AI, mild TR    Frequent UTI's    Gastroesophageal reflux disease    H/O bacterial endocarditis    Rx w IV Abxs in 2020 ([facility] in [place], [place]) // F/u echo with AoV Lambl's excrescence   H/O hiatal hernia    HTN (hypertension)    Hx of fall 10/2020   Hx of MI 2007   Hx of Stroke    IBS (irritable bowel syndrome)    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 (Cervical Fusion)   Premature ventricular contractions    Tubular adenoma of colon    Ulcer    sam  gi   Urinary incontinence     Medications:  Warfarin 7.5 mg on Mon/Fri, 5 mg all other days Last anticoag visit 12/3 - INR 1.9  Assessment: 83 year old female with history of atrial fibrillation on warfarin PTA presents with fall resulting in left rectus sheath hematoma and right parietal scalp hematoma. INR elevated on admission with peak to 6.1. She received two  doses of Vitamin K  with drop in INR to 1.2. Pharmacy consulted to resume warfarin.  Patient previously on Eliquis  and switched to warfarin in August given high cost for Eliquis . Of note, INR mostly subtherapeutic on anticoag visits since initiation.  Goal of Therapy:  INR 2-3 Monitor platelets by anticoagulation protocol: Yes   Plan:  -Give warfarin 7.5 mg x 1 today -Pt has been on current dose of warfarin since October with mostly subtherapeutic INR; it looks like patient was on short course of levofloxacin  in November, which could have potentially contributed to elevated INR -Daily INR -Monitor for any new or worsening signs/symptoms of bleeding   Stefano MARLA Bologna, PharmD, BCPS Clinical Pharmacist 08/24/2024 11:09 AM       [1]  Allergies Allergen Reactions   Sulfa   Antibiotics Itching   Codeine Nausea Only   Loop Diuretics (Sulfonamide) Itching    Listed as sulfonamide in chart that needs merged   Niacin  And Related Other (See Comments)    Must take Flush-free   Prednisone Itching   Rocephin  [Ceftriaxone ] Itching   Sulfa  Antibiotics Itching   Codeine Nausea Only   Macrobid  [Nitrofurantoin ] Nausea Only    Severe nausea   Niacin  Other (See Comments)    Must take Flush-free   Prednisone Itching and Rash   Rocephin  [Ceftriaxone ] Itching    Has taken cefepime  without issue   Sulfonamide Derivatives Itching   Tetracycline Itching and Rash   Tetracyclines & Related Itching and Rash

## 2024-08-24 NOTE — Progress Notes (Signed)
 Mobility Specialist Progress Note:   08/24/24 1257  Mobility  Activity Ambulated with assistance  Level of Assistance Contact guard assist, steadying assist  Assistive Device Front wheel walker  Distance Ambulated (ft) 280 ft  Activity Response Tolerated well  Mobility Referral Yes  Mobility visit 1 Mobility  Mobility Specialist Start Time (ACUTE ONLY) 1201  Mobility Specialist Stop Time (ACUTE ONLY) 1217  Mobility Specialist Time Calculation (min) (ACUTE ONLY) 16 min   Pt was received in bed and agreed to mobility. No complaints during ambulation. Returned to bed with all needs met. Left in room with family.  [employer] - Mobility Specialist

## 2024-08-25 DIAGNOSIS — G9341 Metabolic encephalopathy: Secondary | ICD-10-CM | POA: Diagnosis not present

## 2024-08-25 DIAGNOSIS — Y92009 Unspecified place in unspecified non-institutional (private) residence as the place of occurrence of the external cause: Secondary | ICD-10-CM | POA: Diagnosis not present

## 2024-08-25 DIAGNOSIS — N179 Acute kidney failure, unspecified: Secondary | ICD-10-CM | POA: Diagnosis not present

## 2024-08-25 DIAGNOSIS — S0003XD Contusion of scalp, subsequent encounter: Secondary | ICD-10-CM | POA: Diagnosis not present

## 2024-08-25 DIAGNOSIS — W19XXXA Unspecified fall, initial encounter: Secondary | ICD-10-CM | POA: Diagnosis not present

## 2024-08-25 DIAGNOSIS — S3011XA Contusion of abdominal wall, initial encounter: Secondary | ICD-10-CM | POA: Diagnosis not present

## 2024-08-25 LAB — GLUCOSE, CAPILLARY
Glucose-Capillary: 247 mg/dL — ABNORMAL HIGH (ref 70–99)
Glucose-Capillary: 162 mg/dL — ABNORMAL HIGH (ref 70–99)
Glucose-Capillary: 137 mg/dL — ABNORMAL HIGH (ref 70–99)
Glucose-Capillary: 219 mg/dL — ABNORMAL HIGH (ref 70–99)

## 2024-08-25 LAB — PROTIME-INR
INR: 1.1 (ref 0.8–1.2)
Prothrombin Time: 15.3 s — ABNORMAL HIGH (ref 11.4–15.2)

## 2024-08-25 MED ORDER — WARFARIN SODIUM 5 MG PO TABS
5.0000 mg | ORAL_TABLET | Freq: Once | ORAL | Status: AC
  Administered 2024-08-25: 5 mg via ORAL
  Filled 2024-08-25: qty 1

## 2024-08-25 NOTE — Progress Notes (Signed)
 Physical Therapy Treatment Patient Details Name: [name] [name] MRN: [id number] DOB: Mar 29, 1941 Today's Date: 08/25/2024   History of Present Illness [name] [name] is a 83 y.o. female with medical history significant for hypertension, hyperlipidemia, type 2 diabetes mellitus, CAD, HFpEF, history of CVA, history of DVT, and PAF on warfarin who presents for evaluation of confusion. Patient was brought into the ED by her daughter-in-law who reports that the patient has been intermittently confused and unsteady on her feet ever since her fall on 08/14/2024 for which she was evaluated in the ED    PT Comments  Session focused on neuro re-education exercises to improve static and dynamic standing balance given patient's significant history of falls. CGA-MIN A required for dynamic standing balance activities secondary to narrow BOS and increased posterior and right lateral trunk lean. With cues and demonstration, pt able to improve posture, achieve wider BOS and improve her dynamic standing balance. She will continue to benefit from skilled PT to address these balance deficits as well as strength, endurance and improve her overall independence. PT continues to recommend SNF for <3hrs/day of skilled therapy intervention. PT will continue to follow while she is admitted to acute hospital.    If plan is discharge home, recommend the following: A little help with walking and/or transfers;A little help with bathing/dressing/bathroom;Assistance with cooking/housework;Assist for transportation;Help with stairs or ramp for entrance   Can travel by private vehicle     Yes  Equipment Recommendations  None recommended by PT    Recommendations for Other Services       Precautions / Restrictions Precautions Precautions: Fall Recall of Precautions/Restrictions: Intact Restrictions Weight Bearing Restrictions Per Provider Order: No     Mobility  Bed Mobility Overal bed mobility: Modified  Independent Bed Mobility: Supine to Sit, Sit to Supine     Supine to sit: HOB elevated Sit to supine: HOB elevated   General bed mobility comments: Pt unassisted supine<>sit    Transfers Overall transfer level: Needs assistance Equipment used: 1 person hand held assist Transfers: Sit to/from Stand Sit to Stand: Contact guard assist                Ambulation/Gait                   Stairs             Wheelchair Mobility     Tilt Bed    Modified Rankin (Stroke Patients Only)       Balance Overall balance assessment: Needs assistance Sitting-balance support: No upper extremity supported, Feet supported Sitting balance-Leahy Scale: Normal   Postural control: Right lateral lean, Posterior lean Standing balance support: No upper extremity supported, During functional activity Standing balance-Leahy Scale: Fair                              Hotel Manager: No apparent difficulties Factors Affecting Communication: Hearing impaired  Cognition Arousal: Alert Behavior During Therapy: WFL for tasks assessed/performed   PT - Cognitive impairments: No apparent impairments                           Following commands impaired: Follows multi-step commands with increased time    Cueing Cueing Techniques: Verbal cues, Gestural cues, Visual cues  Exercises Other Exercises Other Exercises: 4-square stepping x5 each direction (clockwise and counter clockwise) at CGA-partial A, VC for widers BOS and slowed  movements in order to improve balance; intermittent R hand hold assist provided Other Exercises: Foward/Backwards walking x5 along 51ft path and CGA, VC for wider BOS and to improve upwards gaze as patient fixates on floor while stepping Other Exercises: 3 box taps x15 each leg and step overs x5 at CGA with RUE support on bed rail, VC for alternating feet and adequate step height/length to clear box     General Comments        Pertinent Vitals/Pain Pain Assessment Pain Assessment: No/denies pain    Home Living                          Prior Function            PT Goals (current goals can now be found in the care plan section) Acute Rehab PT Goals Patient Stated Goal: Regain IND PT Goal Formulation: With patient Time For Goal Achievement: 09/06/24 Potential to Achieve Goals: Good Progress towards PT goals: Progressing toward goals    Frequency    Min 3X/week      PT Plan      Co-evaluation              AM-PAC PT 6 Clicks Mobility   Outcome Measure  Help needed turning from your back to your side while in a flat bed without using bedrails?: None Help needed moving from lying on your back to sitting on the side of a flat bed without using bedrails?: None Help needed moving to and from a bed to a chair (including a wheelchair)?: A Little Help needed standing up from a chair using your arms (e.g., wheelchair or bedside chair)?: A Little Help needed to walk in hospital room?: A Little Help needed climbing 3-5 steps with a railing? : A Little 6 Click Score: 20    End of Session Equipment Utilized During Treatment: Gait belt Activity Tolerance: Patient tolerated treatment well Patient left: in bed;with call bell/phone within reach;with bed alarm set Nurse Communication: Mobility status PT Visit Diagnosis: Difficulty in walking, not elsewhere classified (R26.2);Muscle weakness (generalized) (M62.81)     Time: 8464-8441 PT Time Calculation (min) (ACUTE ONLY): 23 min  Charges:    $Therapeutic Activity: 8-22 mins $Neuromuscular Re-education: 8-22 mins                       Isaiah DEL. Cloy Cozzens, PT, DPT   [employer] 08/25/2024, 4:05 PM

## 2024-08-25 NOTE — Progress Notes (Signed)
 PHARMACY - ANTICOAGULATION CONSULT NOTE  Pharmacy Consult for warfarin Indication: atrial fibrillation  Allergies[1]  Patient Measurements: Height: 5' (152.4 cm) Weight: 50.3 kg (111 lb) IBW/kg (Calculated) : 45.5 HEPARIN  DW (KG): 54.5  Vital Signs: Temp: 98.4 F (36.9 C) (12/23 0635) Temp Source: Oral (12/23 0635) BP: 152/78 (12/23 0635) Pulse Rate: 73 (12/23 0635)  Labs: Recent Labs    08/23/24 0546 08/24/24 0519 08/25/24 0826  HGB 12.3 12.5  --   HCT 37.9 38.0  --   PLT 308 321  --   LABPROT 16.0* 15.6* 15.3*  INR 1.2 1.2 1.1  CREATININE 0.60 0.68  --     Estimated Creatinine Clearance: 37.6 mL/min (by C-G formula based on SCr of 0.68 mg/dL).   Medical History: Past Medical History:  Diagnosis Date   Allergy    Anal fissure    Anxiety    CAD (coronary artery disease)    s/p inf MI 2007 - tx w/ DES to RCA // s/p POBA to [facility] and DES to dRCA in 11/18 ([facility] in [place], [place]) // Myoview  3/21: low risk // s/p DES to pRCA   Cataract    removed both eyes   Cerebrovascular disease    Carotid US  09/2017 [facility]): mild plaque (<50%) in both carotid arteries   Chronic neck pain    Chronic pain syndrome    Diabetes Mellitus, Type 2    Diverticular disease    DJD (degenerative joint disease)    Echocardiogram 10/2018    Echo 10/2018: EF 55-60, normal RVSF, mod MAC, mod TR, severe AoV calcification and sclerosis with nodular calcium /mobile area of calcium  in the LVOT (small veg vs Lambl's excrescence  - consider TEE), mild AI, mild AS (mean 11).    Echocardiogram 04/2019    Echocardiogram 04/2019: EF 60-65, basal septal hypertrophy, grade 2 diastolic dysfunction, normal wall motion, normal RV SF, mild LAE, mod MAC, trivial MR, mod sclerosis of the aortic valve with mod aortic annular calcification, thin mobile filamentous structure on ventricular side of AV likely representing Lambl's excrescence, mild AI, mild TR   Frequent UTI's    Gastroesophageal reflux disease     H/O bacterial endocarditis    Rx w IV Abxs in 2020 ([facility] in [place], [place]) // F/u echo with AoV Lambl's excrescence   H/O hiatal hernia    HTN (hypertension)    Hx of fall 10/2020   Hx of MI 2007   Hx of Stroke    IBS (irritable bowel syndrome)    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 (Cervical Fusion)   Premature ventricular contractions    Tubular adenoma of colon    Ulcer    [facility]   Urinary incontinence     Medications:  Warfarin 7.5 mg on Mon/Fri, 5 mg all other days Last anticoag visit 12/3 - INR 1.9  Assessment: 83 year old female with history of atrial fibrillation on warfarin PTA presents with fall resulting in left rectus sheath hematoma and right parietal scalp hematoma. INR elevated on admission with peak to 6.1. She received two doses of Vitamin K  with drop in INR to 1.2.  Pharmacy consulted to resume warfarin.  Patient previously on Eliquis  and switched to warfarin in August given high cost for Eliquis . Of note, INR mostly subtherapeutic on anticoag visits since initiation. Pt has been on current dose of warfarin since October; it looks like patient was on short course of levofloxacin  in November, which could have potentially contributed to elevated INR.  Goal of Therapy:  INR 2-3 Monitor platelets by anticoagulation protocol: Yes   Plan:  -INR 1.1 - subtherapeutic as expected -Recommend continuing home dose of warfarin for now without any increased doses for subtherapeutic INR for now given recent events -For discharge, would have patient follow up for INR check next week -Daily INR while admitted -Monitor for any new or worsening signs/symptoms of  bleeding   Stefano MARLA Bologna, PharmD, BCPS Clinical Pharmacist 08/25/2024 11:01 AM        [1]  Allergies Allergen Reactions   Sulfa  Antibiotics Itching   Codeine Nausea Only   Loop Diuretics (Sulfonamide) Itching    Listed as sulfonamide in chart that needs merged   Niacin  And Related Other (See Comments)    Must take Flush-free   Prednisone Itching   Rocephin  [Ceftriaxone ] Itching   Sulfa  Antibiotics Itching   Codeine Nausea Only   Macrobid  [Nitrofurantoin ] Nausea Only    Severe nausea   Niacin  Other (See Comments)    Must take Flush-free   Prednisone Itching and Rash   Rocephin  [Ceftriaxone ] Itching    Has taken cefepime  without issue   Sulfonamide Derivatives Itching   Tetracycline Itching and Rash   Tetracyclines & Related Itching and Rash

## 2024-08-25 NOTE — Progress Notes (Deleted)
 Mobility Specialist Progress Note:   08/25/24 1616  Mobility  Activity Ambulated with assistance  Level of Assistance Contact guard assist, steadying assist  Assistive Device Front wheel walker  Distance Ambulated (ft) 300 ft  Activity Response Tolerated well  Mobility Referral Yes  Mobility visit 1 Mobility  Mobility Specialist Start Time (ACUTE ONLY) 1532  Mobility Specialist Stop Time (ACUTE ONLY) 1541  Mobility Specialist Time Calculation (min) (ACUTE ONLY) 9 min   Pt was received in bed and agreed to mobility. No complaints/issues during session. Returned to bed with all needs met. Call bell in reach and bed alarm on.   [employer] - Mobility Specialist

## 2024-08-25 NOTE — Progress Notes (Signed)
 " Progress Note    [name] [name]   FMW:[id number]  DOB: 1941/01/06  DOA: 08/21/2024     0 PCP: Arloa Jarvis, NP  Initial CC: Fall at home  Hospital Course: [name] [name] is an 83 y.o. female past medical history significant for essential hypertension, diabetes mellitus type 2, HFpEF, history of CVA, history of DVT, paroxysmal atrial fibrillation on Coumadin  comes in for acute encephalopathy, daughter-in-law provides most of the history relates she has been intermittently confused and unsteady on her feet with a fall on 08/14/2024.  Was recently started on tramadol  after a fall sustained on 12/12 and now returning after another fall on 12/19.  She has recently relocated and in the process of moving in with one of her daughters, [name].  She reported that she was on a stepstool/ladder and lost her footing and fell off.  Denied any dizziness prior to the fall and no loss of consciousness after the fall.   Assessment/Plan:    Acute metabolic encephalopathy - resolved  - Likely due to polypharmacy and specific tramadol  which was recently started -CT head shows right parietal scalp hematoma measuring approximately 2.6 x 2.7 x 1.5 cm - Mentation has since were improved back to normal - UA negative, ammonia very mildly elevated (43 initially) and treated with lactulose , then normalized - TSH normal - Avoiding sedating and psychotropic medications going forward   Fall Left rectus sheath hematoma Right parietal scalp hematoma - INR elevated on admission 5.8 with peak at 6.1 and has undergone treatment with vitamin K  and since downtrended - INR 1.2 this morning -Imaging studies notable for hematoma involving left rectus sheath measuring 3.6 x 2.4 cm with possible tiny focus of active bleeding on admission.  Right parietal scalp hematoma noted -Plavix  and Coumadin  held on admission  PAF Supratherapeutic INR - On Coumadin  at home with supratherapeutic INR on admission which was reversed -  Rate controlled on Lopressor ; currently on hold - HAS-BLED = 4 points (high risk for major bleeding) (HTN, labile INR, age, high risk meds) - CHA2DS2-VASc = 6 points (9.7% risk per year) (age, sex, HTN, vascular disease, DM) - Family has discussed and are amenable with resuming Coumadin  at this time; reordered on 08/24/2024 - No dosage change recommended per pharmacy at discharge.  Supratherapeutic INR possibly in setting of recent Levaquin  use -Will need ongoing monitoring of INR at discharge   AKI -resolved - baseline creatinine of 0.8, on admission 1.32 - s/p IVF with normalized creat   Hypovolemic hyponatremia -resolved Sodium and chloride are improving continue IV fluids for an additional 24 hours.   Type 2 diabetes mellitus -Last A1c 7.3% on 07/19/2024 Continue sliding scale insulin    CAD - At home on Plavix , statin, metoprolol    Depression/anxiety Continue Effexor    Possible obstructive sleep apnea Continue CPAP at night.  Sleep study as an outpatient recommended She is becoming hypoxic during sleep  Interval History:  No events overnight.  Resting comfortably in bed. Stable for discharge to rehab at this point.  Antimicrobials:   DVT prophylaxis:  Place and maintain sequential compression device Start: 08/21/24 2310 warfarin (COUMADIN ) tablet 5 mg   Code Status:   Code Status: Limited: Do not attempt resuscitation (DNR) -DNR-LIMITED -Do Not Intubate/DNI   Mobility Assessment (Last 72 Hours)     Mobility Assessment     Row Name 08/24/24 1930 08/24/24 0823 08/24/24 0700 08/23/24 2115 08/23/24 1500   Does the patient have exclusion criteria? No- Perform mobility assessment No-  Perform mobility assessment No- Perform mobility assessment No- Perform mobility assessment No- Perform mobility assessment   What is the highest level of mobility based on the mobility assessment? Level 4 (Ambulates with assistance) - Balance while stepping forward/back - Complete Level 4  (Ambulates with assistance) - Balance while stepping forward/back - Complete Level 4 (Ambulates with assistance) - Balance while stepping forward/back - Complete Level 4 (Ambulates with assistance) - Balance while stepping forward/back - Complete Level 4 (Ambulates with assistance) - Balance while stepping forward/back - Complete    Row Name 08/23/24 1300 08/22/24 2203         Does the patient have exclusion criteria? -- No- Perform mobility assessment      What is the highest level of mobility based on the mobility assessment? Level 4 (Ambulates with assistance) - Balance while stepping forward/back - Complete Level 4 (Ambulates with assistance) - Balance while stepping forward/back - Complete         Diet: Diet Orders (From admission, onward)     Start     Ordered   08/21/24 2246  Diet regular Room service appropriate? Yes; Fluid consistency: Thin  Diet effective now       Question Answer Comment  Room service appropriate? Yes   Fluid consistency: Thin      08/21/24 2249            Barriers to discharge: None Disposition Plan:  SNF HH orders placed: N/A Status is: Observation  Objective: Blood pressure (!) 153/79, pulse 69, temperature 98.7 F (37.1 C), temperature source Oral, resp. rate 18, height 5' (1.524 m), weight 50.3 kg, SpO2 95%.  Examination:  Physical Exam Constitutional:      Appearance: Normal appearance.  HENT:     Head: Normocephalic and atraumatic.     Comments: Bruising around right eye extending down Caster to her right neck    Mouth/Throat:     Mouth: Mucous membranes are moist.  Eyes:     Extraocular Movements: Extraocular movements intact.  Cardiovascular:     Rate and Rhythm: Normal rate and regular rhythm.  Pulmonary:     Effort: Pulmonary effort is normal. No respiratory distress.     Breath sounds: Normal breath sounds. No wheezing.  Abdominal:     General: Bowel sounds are normal. There is no distension.     Palpations: Abdomen is soft.      Tenderness: There is no abdominal tenderness.     Comments: Bruising noted along lower middle/left sided abdomen  Musculoskeletal:        General: Normal range of motion.     Cervical back: Normal range of motion and neck supple.  Skin:    General: Skin is warm and dry.  Neurological:     General: No focal deficit present.     Mental Status: She is alert.  Psychiatric:        Mood and Affect: Mood normal.      Consultants:    Procedures:    Data Reviewed: Results for orders placed or performed during the hospital encounter of 08/21/24 (from the past 24 hours)  Glucose, capillary     Status: Abnormal   Collection Time: 08/24/24  5:01 PM  Result Value Ref Range   Glucose-Capillary 206 (H) 70 - 99 mg/dL  Glucose, capillary     Status: Abnormal   Collection Time: 08/24/24  9:51 PM  Result Value Ref Range   Glucose-Capillary 122 (H) 70 - 99 mg/dL  Glucose, capillary  Status: Abnormal   Collection Time: 08/25/24  7:53 AM  Result Value Ref Range   Glucose-Capillary 247 (H) 70 - 99 mg/dL  Protime-INR     Status: Abnormal   Collection Time: 08/25/24  8:26 AM  Result Value Ref Range   Prothrombin Time 15.3 (H) 11.4 - 15.2 seconds   INR 1.1 0.8 - 1.2  Glucose, capillary     Status: Abnormal   Collection Time: 08/25/24 11:39 AM  Result Value Ref Range   Glucose-Capillary 162 (H) 70 - 99 mg/dL    I have reviewed pertinent nursing notes, vitals, labs, and images as necessary. I have ordered labwork to follow up on as indicated.  I have reviewed the last notes from staff over past 24 hours. I have discussed patient's care plan and test results with nursing staff, CM/SW, and other staff as appropriate.  Old records reviewed in assessment of this patient    LOS: 0 days   Alm Apo, MD [facility] 08/25/2024, 3:17 PM "

## 2024-08-25 NOTE — Plan of Care (Signed)
 Problem: Education: Goal: Knowledge of General Education information will improve Description: Including pain rating scale, medication(s)/side effects and non-pharmacologic comfort measures Outcome: Progressing   Problem: Health Behavior/Discharge Planning: Goal: Ability to manage health-related needs will improve Outcome: Progressing   Problem: Clinical Measurements: Goal: Ability to maintain clinical measurements within normal limits will improve Outcome: Progressing Goal: Will remain free from infection Outcome: Progressing Goal: Diagnostic test results will improve Outcome: Progressing Goal: Respiratory complications will improve Outcome: Progressing Goal: Cardiovascular complication will be avoided Outcome: Progressing   Problem: Activity: Goal: Risk for activity intolerance will decrease Outcome: Progressing   Problem: Nutrition: Goal: Adequate nutrition will be maintained Outcome: Progressing   Problem: Coping: Goal: Level of anxiety will decrease Outcome: Progressing   Problem: Elimination: Goal: Will not experience complications related to bowel motility Outcome: Progressing Goal: Will not experience complications related to urinary retention Outcome: Progressing   Problem: Pain Managment: Goal: General experience of comfort will improve and/or be controlled Outcome: Progressing   Problem: Safety: Goal: Ability to remain free from injury will improve Outcome: Progressing   Problem: Skin Integrity: Goal: Risk for impaired skin integrity will decrease Outcome: Progressing   Problem: Education: Goal: Ability to describe self-care measures that may prevent or decrease complications (Diabetes Survival Skills Education) will improve Outcome: Progressing Goal: Individualized Educational Video(s) Outcome: Progressing   Problem: Coping: Goal: Ability to adjust to condition or change in health will improve Outcome: Progressing   Problem: Fluid  Volume: Goal: Ability to maintain a balanced intake and output will improve Outcome: Progressing   Problem: Health Behavior/Discharge Planning: Goal: Ability to identify and utilize available resources and services will improve Outcome: Progressing Goal: Ability to manage health-related needs will improve Outcome: Progressing   Problem: Metabolic: Goal: Ability to maintain appropriate glucose levels will improve Outcome: Progressing   Problem: Nutritional: Goal: Maintenance of adequate nutrition will improve Outcome: Progressing Goal: Progress toward achieving an optimal weight will improve Outcome: Progressing   Problem: Skin Integrity: Goal: Risk for impaired skin integrity will decrease Outcome: Progressing   Problem: Tissue Perfusion: Goal: Adequacy of tissue perfusion will improve Outcome: Progressing   Problem: Education: Goal: Knowledge of General Education information will improve Description: Including pain rating scale, medication(s)/side effects and non-pharmacologic comfort measures Outcome: Progressing   Problem: Health Behavior/Discharge Planning: Goal: Ability to manage health-related needs will improve Outcome: Progressing   Problem: Clinical Measurements: Goal: Ability to maintain clinical measurements within normal limits will improve Outcome: Progressing Goal: Will remain free from infection Outcome: Progressing Goal: Diagnostic test results will improve Outcome: Progressing Goal: Respiratory complications will improve Outcome: Progressing Goal: Cardiovascular complication will be avoided Outcome: Progressing   Problem: Activity: Goal: Risk for activity intolerance will decrease Outcome: Progressing   Problem: Nutrition: Goal: Adequate nutrition will be maintained Outcome: Progressing   Problem: Coping: Goal: Level of anxiety will decrease Outcome: Progressing   Problem: Elimination: Goal: Will not experience complications related to  bowel motility Outcome: Progressing Goal: Will not experience complications related to urinary retention Outcome: Progressing   Problem: Pain Managment: Goal: General experience of comfort will improve and/or be controlled Outcome: Progressing   Problem: Safety: Goal: Ability to remain free from injury will improve Outcome: Progressing   Problem: Skin Integrity: Goal: Risk for impaired skin integrity will decrease Outcome: Progressing   Problem: Education: Goal: Ability to describe self-care measures that may prevent or decrease complications (Diabetes Survival Skills Education) will improve Outcome: Progressing Goal: Individualized Educational Video(s) Outcome: Progressing   Problem: Coping: Goal: Ability  to adjust to condition or change in health will improve Outcome: Progressing   Problem: Fluid Volume: Goal: Ability to maintain a balanced intake and output will improve Outcome: Progressing   Problem: Health Behavior/Discharge Planning: Goal: Ability to identify and utilize available resources and services will improve Outcome: Progressing Goal: Ability to manage health-related needs will improve Outcome: Progressing   Problem: Metabolic: Goal: Ability to maintain appropriate glucose levels will improve Outcome: Progressing   Problem: Nutritional: Goal: Maintenance of adequate nutrition will improve Outcome: Progressing Goal: Progress toward achieving an optimal weight will improve Outcome: Progressing   Problem: Skin Integrity: Goal: Risk for impaired skin integrity will decrease Outcome: Progressing   Problem: Tissue Perfusion: Goal: Adequacy of tissue perfusion will improve Outcome: Progressing

## 2024-08-25 NOTE — Plan of Care (Signed)
  Problem: Education: Goal: Knowledge of General Education information will improve Description: Including pain rating scale, medication(s)/side effects and non-pharmacologic comfort measures Outcome: Progressing   Problem: Clinical Measurements: Goal: Ability to maintain clinical measurements within normal limits will improve Outcome: Progressing   Problem: Clinical Measurements: Goal: Will remain free from infection Outcome: Progressing   Problem: Activity: Goal: Risk for activity intolerance will decrease Outcome: Progressing   Problem: Pain Managment: Goal: General experience of comfort will improve and/or be controlled Outcome: Progressing

## 2024-08-25 NOTE — Progress Notes (Signed)
 Mobility Specialist Progress Note:   08/25/24 1616  Mobility  Activity Ambulated with assistance  Level of Assistance Contact guard assist, steadying assist  Assistive Device Front wheel walker  Distance Ambulated (ft) 300 ft  Activity Response Tolerated well  Mobility Referral Yes  Mobility visit 1 Mobility  Mobility Specialist Start Time (ACUTE ONLY) 1520  Mobility Specialist Stop Time (ACUTE ONLY) 1530  Mobility Specialist Time Calculation (min) (ACUTE ONLY) 10 min   Pt was received in bed and agreed to mobility. No complaints/issues during ambulation. Returned to bed with all needs met. Call bell in reach and bed alarm on.  [employer] - Mobility Specialist

## 2024-08-25 NOTE — TOC Progression Note (Addendum)
 Transition of Care [facility]) - Progression Note    Patient Details  Name: [name] [name] MRN: [id number] Date of Birth: Feb 15, 1941  [facility] CM/SW Contact  Heather DELENA Saltness, LCSW Phone Number: 08/25/2024, 2:55 PM  Clinical Narrative:    CSW spoke with pt's daughter, Rojelio Core [phone], via phone call to discuss SNF bed availability and obtain preferred facility. CSW provided pt's daughter will list of SNFs with available beds including name of facility, location, and Medicare Star-Rating. Pt's daughter chooses bed offer at [facility]. CSW notified Brittany at [facility] of pt's acceptance. Insurance authorization requested, currently pending. TOC will continue to follow.  Medicare Star-Ratings  [facility] [facility] [address] [place], [place] [address] [phone] Overall rating ??? Average  [facility] [address] [place], [place] [address] [phone] Overall rating ????? Above average [facility] [address] [place], [place] [address] [phone] Overall rating ????? Much above average  [facility] [address] [place], [place] [address] [phone] Overall rating ??? Average   Expected Discharge Plan: Home w Home Health Services Barriers to Discharge: Continued Medical Work up   Expected Discharge Plan and Services   Discharge Planning Services: CM Consult   Living arrangements for the past 2 months: Apartment                 DME Arranged: N/A DME Agency: NA       HH Arranged: NA HH Agency: NA         Social Drivers of Health (SDOH) Interventions SDOH Screenings   Food Insecurity: No Food Insecurity (08/22/2024)  Housing: Low Risk (08/22/2024)  Transportation Needs: No Transportation Needs (08/22/2024)  Utilities: Not At Risk (08/22/2024)  Alcohol  Screen: Low Risk (11/03/2021)  Depression (PHQ2-9): Low Risk (05/18/2022)  Financial  Resource Strain: Low Risk (11/03/2021)  Physical Activity: Sufficiently Active (11/03/2021)  Social Connections: Moderately Isolated (08/22/2024)  Stress: No Stress Concern Present (11/03/2021)  Tobacco Use: Low Risk (08/21/2024)    Readmission Risk Interventions     No data to display          Signed: Heather Saltness, MSW, LCSW Clinical Social Worker Inpatient Care Management 08/25/2024 2:59 PM

## 2024-08-25 NOTE — Progress Notes (Signed)
" °   08/25/24 2200  BiPAP/CPAP/SIPAP  BiPAP/CPAP/SIPAP Pt Type Adult  Reason BIPAP/CPAP not in use Non-compliant    "

## 2024-08-25 NOTE — Progress Notes (Signed)
 Mobility Specialist Progress Note:   08/25/24 1020  Mobility  Activity Ambulated with assistance  Level of Assistance Contact guard assist, steadying assist  Assistive Device Front wheel walker  Distance Ambulated (ft) 290 ft  Activity Response Tolerated well  Mobility Referral Yes  Mobility visit 1 Mobility  Mobility Specialist Start Time (ACUTE ONLY) 0944  Mobility Specialist Stop Time (ACUTE ONLY) 0954  Mobility Specialist Time Calculation (min) (ACUTE ONLY) 10 min   Pt was received in bed and agreed to mobility. No complaints/issues during ambulation. Returned to bed with all needs met. Call bell in reach and bed alarm on.  [employer] - Mobility Specialist

## 2024-08-26 DIAGNOSIS — G9341 Metabolic encephalopathy: Secondary | ICD-10-CM | POA: Diagnosis not present

## 2024-08-26 DIAGNOSIS — S0003XD Contusion of scalp, subsequent encounter: Secondary | ICD-10-CM | POA: Diagnosis not present

## 2024-08-26 DIAGNOSIS — S3011XA Contusion of abdominal wall, initial encounter: Secondary | ICD-10-CM | POA: Diagnosis not present

## 2024-08-26 DIAGNOSIS — W19XXXA Unspecified fall, initial encounter: Secondary | ICD-10-CM | POA: Diagnosis not present

## 2024-08-26 LAB — GLUCOSE, CAPILLARY
Glucose-Capillary: 203 mg/dL — ABNORMAL HIGH (ref 70–99)
Glucose-Capillary: 152 mg/dL — ABNORMAL HIGH (ref 70–99)
Glucose-Capillary: 175 mg/dL — ABNORMAL HIGH (ref 70–99)
Glucose-Capillary: 164 mg/dL — ABNORMAL HIGH (ref 70–99)

## 2024-08-26 LAB — PROTIME-INR
INR: 1.2 (ref 0.8–1.2)
Prothrombin Time: 15.4 s — ABNORMAL HIGH (ref 11.4–15.2)

## 2024-08-26 MED ORDER — WARFARIN SODIUM 5 MG PO TABS
5.0000 mg | ORAL_TABLET | ORAL | Status: DC
  Administered 2024-08-26: 5 mg via ORAL
  Filled 2024-08-26: qty 1

## 2024-08-26 MED ORDER — WARFARIN SODIUM 5 MG PO TABS: 7.5000 mg | ORAL_TABLET | ORAL | Status: DC

## 2024-08-26 NOTE — Progress Notes (Signed)
 Mobility Specialist Progress Note:   08/26/24 1326  Mobility  Activity Ambulated with assistance  Level of Assistance Contact guard assist, steadying assist  Assistive Device Front wheel walker  Distance Ambulated (ft) 285 ft  Activity Response Tolerated well  Mobility Referral Yes  Mobility visit 1 Mobility  Mobility Specialist Start Time (ACUTE ONLY) 1232  Mobility Specialist Stop Time (ACUTE ONLY) 1242  Mobility Specialist Time Calculation (min) (ACUTE ONLY) 10 min   Pt was received in bed and agreed to mobility. No complaints/issues during session. Returned to bed with all needs met. Call bell in reach. Left in room with family.  [employer] - Mobility Specialist

## 2024-08-26 NOTE — TOC Progression Note (Signed)
 Transition of Care [facility]) - Progression Note    Patient Details  Name: [name] [name] MRN: [id number] Date of Birth: 01/13/1941  Transition of Care [facility]) CM/SW Contact  Heather DELENA Saltness, LCSW Phone Number: 08/26/2024, 10:31 AM  Clinical Narrative:    Pt's insurance authorization for SNF rehab at [facility] still pending. TOC will continue to follow.   Expected Discharge Plan: Home w Home Health Services Barriers to Discharge: Continued Medical Work up   Expected Discharge Plan and Services   Discharge Planning Services: CM Consult   Living arrangements for the past 2 months: Apartment                 DME Arranged: N/A DME Agency: NA       HH Arranged: NA HH Agency: NA         Social Drivers of Health (SDOH) Interventions SDOH Screenings   Food Insecurity: No Food Insecurity (08/22/2024)  Housing: Low Risk (08/22/2024)  Transportation Needs: No Transportation Needs (08/22/2024)  Utilities: Not At Risk (08/22/2024)  Alcohol  Screen: Low Risk (11/03/2021)  Depression (PHQ2-9): Low Risk (05/18/2022)  Financial Resource Strain: Low Risk (11/03/2021)  Physical Activity: Sufficiently Active (11/03/2021)  Social Connections: Moderately Isolated (08/22/2024)  Stress: No Stress Concern Present (11/03/2021)  Tobacco Use: Low Risk (08/21/2024)    Readmission Risk Interventions     No data to display          Signed: Heather Saltness, MSW, LCSW Clinical Social Worker Inpatient Care Management 08/26/2024 10:31 AM

## 2024-08-26 NOTE — Plan of Care (Signed)
" °  Problem: Activity: Goal: Risk for activity intolerance will decrease Outcome: Progressing   Problem: Nutrition: Goal: Adequate nutrition will be maintained Outcome: Progressing   Problem: Coping: Goal: Level of anxiety will decrease Outcome: Progressing   Problem: Pain Managment: Goal: General experience of comfort will improve and/or be controlled Outcome: Progressing   Problem: Safety: Goal: Ability to remain free from injury will improve Outcome: Progressing   Problem: Skin Integrity: Goal: Risk for impaired skin integrity will decrease Outcome: Progressing   Problem: Coping: Goal: Ability to adjust to condition or change in health will improve Outcome: Progressing   Problem: Skin Integrity: Goal: Risk for impaired skin integrity will decrease Outcome: Progressing   "

## 2024-08-26 NOTE — Progress Notes (Signed)
 PHARMACY - ANTICOAGULATION CONSULT NOTE  Pharmacy Consult for warfarin Indication: atrial fibrillation  Allergies[1]  Patient Measurements: Height: 5' (152.4 cm) Weight: 50.2 kg (110 lb 10.7 oz) IBW/kg (Calculated) : 45.5 HEPARIN  DW (KG): 54.5  Vital Signs: Temp: 98.9 F (37.2 C) (12/24 0428) Temp Source: Oral (12/24 0428) BP: 149/89 (12/24 0428) Pulse Rate: 85 (12/24 0428)  Labs: Recent Labs    08/24/24 0519 08/25/24 0826 08/26/24 0537  HGB 12.5  --   --   HCT 38.0  --   --   PLT 321  --   --   LABPROT 15.6* 15.3* 15.4*  INR 1.2 1.1 1.2  CREATININE 0.68  --   --     Estimated Creatinine Clearance: 37.6 mL/min (by C-G formula based on SCr of 0.68 mg/dL).   Medical History: Past Medical History:  Diagnosis Date   Allergy    Anal fissure    Anxiety    CAD (coronary artery disease)    s/p inf MI 2007 - tx w/ DES to RCA // s/p POBA to [facility] and DES to dRCA in 11/18 ([facility] in [place], [place]) // Myoview  3/21: low risk // s/p DES to pRCA   Cataract    removed both eyes   Cerebrovascular disease    Carotid US  09/2017 [facility]): mild plaque (<50%) in both carotid arteries   Chronic neck pain    Chronic pain syndrome    Diabetes Mellitus, Type 2    Diverticular disease    DJD (degenerative joint disease)    Echocardiogram 10/2018    Echo 10/2018: EF 55-60, normal RVSF, mod MAC, mod TR, severe AoV calcification and sclerosis with nodular calcium /mobile area of calcium  in the LVOT (small veg vs Lambl's excrescence  - consider TEE), mild AI, mild AS (mean 11).    Echocardiogram 04/2019    Echocardiogram 04/2019: EF 60-65, basal septal hypertrophy, grade 2 diastolic dysfunction, normal wall motion, normal RV SF, mild LAE, mod MAC, trivial MR, mod sclerosis of the aortic valve with mod aortic annular calcification, thin mobile filamentous structure on ventricular side of AV likely representing Lambl's excrescence, mild AI, mild TR   Frequent UTI's    Gastroesophageal reflux  disease    H/O bacterial endocarditis    Rx w IV Abxs in 2020 ([facility] in [place], [place]) // F/u echo with AoV Lambl's excrescence   H/O hiatal hernia    HTN (hypertension)    Hx of fall 10/2020   Hx of MI 2007   Hx of Stroke    IBS (irritable bowel syndrome)    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 (Cervical Fusion)   Premature ventricular contractions    Tubular adenoma of colon    Ulcer    [facility] [place] [facility]   Urinary incontinence     Medications:  Warfarin 7.5 mg on Mon/Fri, 5 mg all other days Last anticoag visit 12/3 - INR 1.9  Assessment: 83 year old female with history of atrial fibrillation on warfarin PTA presents with fall resulting in left rectus sheath hematoma and right parietal scalp hematoma. INR elevated on admission with peak to 6.1. She received two  doses of Vitamin K  with drop in INR to 1.2. Pharmacy consulted to resume warfarin.  Patient previously on Eliquis  and switched to warfarin in August given high cost for Eliquis . Of note, INR mostly subtherapeutic on anticoag visits since initiation. Pt has been on current dose of warfarin since October; it looks like patient was on short course of levofloxacin  in November, which could have potentially contributed to elevated INR although wouldn't expect to be seeing effects after several weeks. Likely multifactorial.  Goal of Therapy:  INR 2-3 Monitor platelets by anticoagulation protocol: Yes   Plan:  -INR 1.2 - remains subtherapeutic, expect to see effects in next few days -Continue with home dose of warfarin - 5 mg today -For discharge, would continue home dose of warfarin and have patient follow up for INR check next week (if  going to facility, may be able to check INR more frequently) -Daily INR while admitted -Monitor for any new or worsening signs/symptoms of bleeding   Stefano MARLA Bologna, PharmD, BCPS Clinical Pharmacist 08/26/2024 7:14 AM         [1]  Allergies Allergen Reactions   Sulfa  Antibiotics Itching   Codeine Nausea Only   Loop Diuretics (Sulfonamide) Itching    Listed as sulfonamide in chart that needs merged   Niacin  And Related Other (See Comments)    Must take Flush-free   Prednisone Itching   Rocephin  [Ceftriaxone ] Itching   Sulfa  Antibiotics Itching   Codeine Nausea Only   Macrobid  [Nitrofurantoin ] Nausea Only    Severe nausea   Niacin  Other (See Comments)    Must take Flush-free   Prednisone Itching and Rash   Rocephin  [Ceftriaxone ] Itching    Has taken cefepime  without issue   Sulfonamide Derivatives Itching   Tetracycline Itching and Rash   Tetracyclines & Related Itching and Rash

## 2024-08-26 NOTE — Progress Notes (Signed)
 " Progress Note    [name] [name]   FMW:[id number]  DOB: 1940-10-17  DOA: 08/21/2024     0 PCP: Arloa Jarvis, NP  Initial CC: Fall at home  Hospital Course: [name] [name] is an 83 y.o. female past medical history significant for essential hypertension, diabetes mellitus type 2, HFpEF, history of CVA, history of DVT, paroxysmal atrial fibrillation on Coumadin  comes in for acute encephalopathy, daughter-in-law provides most of the history relates she has been intermittently confused and unsteady on her feet with a fall on 08/14/2024.  Was recently started on tramadol  after a fall sustained on 12/12 and now returning after another fall on 12/19.  She has recently relocated and in the process of moving in with one of her daughters, [name].  She reported that she was on a stepstool/ladder and lost her footing and fell off.  Denied any dizziness prior to the fall and no loss of consciousness after the fall.   Assessment/Plan:    Acute metabolic encephalopathy - resolved  - Likely due to polypharmacy and specific tramadol  which was recently started -CT head shows right parietal scalp hematoma measuring approximately 2.6 x 2.7 x 1.5 cm - Mentation has since were improved back to normal - UA negative, ammonia very mildly elevated (43 initially) and treated with lactulose , then normalized - TSH normal - Avoiding sedating and psychotropic medications going forward   Fall Left rectus sheath hematoma Right parietal scalp hematoma - INR elevated on admission 5.8 with peak at 6.1 and has undergone treatment with vitamin K  and since downtrended - INR 1.2 this morning -Imaging studies notable for hematoma involving left rectus sheath measuring 3.6 x 2.4 cm with possible tiny focus of active bleeding on admission.  Right parietal scalp hematoma noted -Plavix  and Coumadin  held on admission  PAF Supratherapeutic INR - On Coumadin  at home with supratherapeutic INR on admission which was reversed -  Rate controlled on Lopressor ; currently on hold - HAS-BLED = 4 points (high risk for major bleeding) (HTN, labile INR, age, high risk meds) - CHA2DS2-VASc = 6 points (9.7% risk per year) (age, sex, HTN, vascular disease, DM) - Family has discussed and are amenable with resuming Coumadin  at this time; reordered on 08/24/2024 - No dosage change recommended per pharmacy at discharge.  Supratherapeutic INR possibly in setting of recent Levaquin  use -Will need ongoing monitoring of INR at discharge   AKI -resolved - baseline creatinine of 0.8, on admission 1.32 - s/p IVF with normalized creat   Hypovolemic hyponatremia -resolved Sodium and chloride are improving continue IV fluids for an additional 24 hours.   Type 2 diabetes mellitus -Last A1c 7.3% on 07/19/2024 Continue sliding scale insulin    CAD - At home on Plavix , statin, metoprolol    Depression/anxiety Continue Effexor    Possible obstructive sleep apnea Continue CPAP at night.  Sleep study as an outpatient recommended She is becoming hypoxic during sleep  Interval History:  No events overnight.  Resting comfortably in bed. Stable for discharge to rehab at this point. Awaiting insurance auth.   Antimicrobials:   DVT prophylaxis:  Place and maintain sequential compression device Start: 08/21/24 2310 warfarin (COUMADIN ) tablet 5 mg  warfarin (COUMADIN ) tablet 7.5 mg   Code Status:   Code Status: Limited: Do not attempt resuscitation (DNR) -DNR-LIMITED -Do Not Intubate/DNI   Mobility Assessment (Last 72 Hours)     Mobility Assessment     Row Name 08/26/24 0912 08/25/24 1915 08/25/24 1604 08/24/24 1930 08/24/24 0823   Does  the patient have exclusion criteria? No- Perform mobility assessment No- Perform mobility assessment No- Perform mobility assessment No- Perform mobility assessment No- Perform mobility assessment   What is the highest level of mobility based on the mobility assessment? Level 4 (Ambulates with  assistance) - Balance while stepping forward/back - Complete Level 4 (Ambulates with assistance) - Balance while stepping forward/back - Complete Level 4 (Ambulates with assistance) - Balance while stepping forward/back - Complete Level 4 (Ambulates with assistance) - Balance while stepping forward/back - Complete Level 4 (Ambulates with assistance) - Balance while stepping forward/back - Complete    Row Name 08/24/24 0700 08/23/24 2115         Does the patient have exclusion criteria? No- Perform mobility assessment No- Perform mobility assessment      What is the highest level of mobility based on the mobility assessment? Level 4 (Ambulates with assistance) - Balance while stepping forward/back - Complete Level 4 (Ambulates with assistance) - Balance while stepping forward/back - Complete         Diet: Diet Orders (From admission, onward)     Start     Ordered   08/21/24 2246  Diet regular Room service appropriate? Yes; Fluid consistency: Thin  Diet effective now       Question Answer Comment  Room service appropriate? Yes   Fluid consistency: Thin      08/21/24 2249            Barriers to discharge: None Disposition Plan:  SNF HH orders placed: N/A Status is: Observation  Objective: Blood pressure (!) 157/60, pulse 77, temperature 98.7 F (37.1 C), resp. rate 13, height 5' (1.524 m), weight 50.2 kg, SpO2 93%.  Examination:  Physical Exam Constitutional:      Appearance: Normal appearance.  HENT:     Head: Normocephalic and atraumatic.     Comments: Bruising around right eye extending down [place] to her right neck    Mouth/Throat:     Mouth: Mucous membranes are moist.  Eyes:     Extraocular Movements: Extraocular movements intact.  Cardiovascular:     Rate and Rhythm: Normal rate and regular rhythm.  Pulmonary:     Effort: Pulmonary effort is normal. No respiratory distress.     Breath sounds: Normal breath sounds. No wheezing.  Abdominal:     General: Bowel  sounds are normal. There is no distension.     Palpations: Abdomen is soft.     Tenderness: There is no abdominal tenderness.     Comments: Bruising noted along lower middle/left sided abdomen  Musculoskeletal:        General: Normal range of motion.     Cervical back: Normal range of motion and neck supple.  Skin:    General: Skin is warm and dry.  Neurological:     General: No focal deficit present.     Mental Status: She is alert.  Psychiatric:        Mood and Affect: Mood normal.      Consultants:    Procedures:    Data Reviewed: Results for orders placed or performed during the hospital encounter of 08/21/24 (from the past 24 hours)  Glucose, capillary     Status: Abnormal   Collection Time: 08/25/24  9:11 PM  Result Value Ref Range   Glucose-Capillary 219 (H) 70 - 99 mg/dL  Protime-INR     Status: Abnormal   Collection Time: 08/26/24  5:37 AM  Result Value Ref Range   Prothrombin Time  15.4 (H) 11.4 - 15.2 seconds   INR 1.2 0.8 - 1.2  Glucose, capillary     Status: Abnormal   Collection Time: 08/26/24  7:43 AM  Result Value Ref Range   Glucose-Capillary 203 (H) 70 - 99 mg/dL  Glucose, capillary     Status: Abnormal   Collection Time: 08/26/24 12:27 PM  Result Value Ref Range   Glucose-Capillary 152 (H) 70 - 99 mg/dL    I have reviewed pertinent nursing notes, vitals, labs, and images as necessary. I have ordered labwork to follow up on as indicated.  I have reviewed the last notes from staff over past 24 hours. I have discussed patient's care plan and test results with nursing staff, CM/SW, and other staff as appropriate.  Old records reviewed in assessment of this patient    LOS: 0 days   Alm Apo, MD [facility] 08/26/2024, 5:34 PM "

## 2024-08-26 NOTE — Progress Notes (Signed)
" °   08/26/24 2026  Assess: MEWS Score  Temp 99 F (37.2 C)  BP (!) 202/96  MAP (mmHg) 127  Pulse Rate 99  Resp 16  Level of Consciousness Alert  SpO2 100 %  O2 Device Room Air  Assess: MEWS Score  MEWS Temp 0  MEWS Systolic 2  MEWS Pulse 0  MEWS RR 0  MEWS LOC 0  MEWS Score 2  MEWS Score Color Yellow  Assess: if the MEWS score is Yellow or Red  Were vital signs accurate and taken at a resting state? Yes  Does the patient meet 2 or more of the SIRS criteria? No  MEWS guidelines implemented  Yes, yellow  Treat  MEWS Interventions Considered administering scheduled or prn medications/treatments as ordered  Take Vital Signs  Increase Vital Sign Frequency  Yellow: Q2hr x1, continue Q4hrs until patient remains green for 12hrs  Escalate  MEWS: Escalate Yellow: Discuss with charge nurse and consider notifying provider and/or RRT  Notify: Charge Nurse/RN  Name of Charge Nurse/RN Notified Publishing Copy  Provider Notification  Provider Name/Title J. Blondie, NP  Date Provider Notified 08/26/24  Time Provider Notified 2036  Method of Notification Page  Notification Reason Critical Result  Provider response Other (Comment)  Date of Provider Response 08/26/24  Time of Provider Response 2037  Notify: Rapid Response  Name of Rapid Response RN Notified na  Assess: SIRS CRITERIA  SIRS Temperature  0  SIRS Respirations  0  SIRS Pulse 1  SIRS WBC 0  SIRS Score Sum  1   Gave pm lopressor  early cont to monitor. "

## 2024-08-26 NOTE — Progress Notes (Signed)
" °   08/26/24 2356  BiPAP/CPAP/SIPAP  BiPAP/CPAP/SIPAP Pt Type Adult  Reason BIPAP/CPAP not in use Non-compliant    "

## 2024-08-27 DIAGNOSIS — S3011XA Contusion of abdominal wall, initial encounter: Secondary | ICD-10-CM | POA: Diagnosis not present

## 2024-08-27 DIAGNOSIS — G9341 Metabolic encephalopathy: Secondary | ICD-10-CM | POA: Diagnosis not present

## 2024-08-27 DIAGNOSIS — S0003XD Contusion of scalp, subsequent encounter: Secondary | ICD-10-CM | POA: Diagnosis not present

## 2024-08-27 DIAGNOSIS — W19XXXA Unspecified fall, initial encounter: Secondary | ICD-10-CM | POA: Diagnosis not present

## 2024-08-27 LAB — PROTIME-INR
INR: 1.3 — ABNORMAL HIGH (ref 0.8–1.2)
Prothrombin Time: 16.8 s — ABNORMAL HIGH (ref 11.4–15.2)

## 2024-08-27 LAB — GLUCOSE, CAPILLARY
Glucose-Capillary: 187 mg/dL — ABNORMAL HIGH (ref 70–99)
Glucose-Capillary: 175 mg/dL — ABNORMAL HIGH (ref 70–99)
Glucose-Capillary: 147 mg/dL — ABNORMAL HIGH (ref 70–99)
Glucose-Capillary: 198 mg/dL — ABNORMAL HIGH (ref 70–99)

## 2024-08-27 MED ORDER — LOSARTAN POTASSIUM 50 MG PO TABS
50.0000 mg | ORAL_TABLET | Freq: Every day | ORAL | Status: DC
  Administered 2024-08-27 – 2024-08-29 (×3): 50 mg via ORAL
  Filled 2024-08-27 (×3): qty 1

## 2024-08-27 MED ORDER — WARFARIN SODIUM 5 MG PO TABS
7.5000 mg | ORAL_TABLET | Freq: Once | ORAL | Status: AC
  Administered 2024-08-27: 7.5 mg via ORAL
  Filled 2024-08-27: qty 1

## 2024-08-27 NOTE — Progress Notes (Signed)
 " Progress Note    [name] [name]   FMW:[id number]  DOB: [birth year]  DOA: 08/21/2024     0 PCP: [name] Jarvis, NP  Initial CC: Fall at home  Hospital Course: [name] [name] is an 83 y.o. female past medical history significant for essential hypertension, diabetes mellitus type 2, HFpEF, history of CVA, history of DVT, paroxysmal atrial fibrillation on Coumadin  comes in for acute encephalopathy, daughter-in-law provides most of the history relates she has been intermittently confused and unsteady on her feet with a fall on 08/14/2024.  Was recently started on tramadol  after a fall sustained on 12/12 and now returning after another fall on 12/19.  She has recently relocated and in the process of moving in with one of her daughters, [name] [name].  She reported that she was on a stepstool/ladder and lost her footing and fell off.  Denied any dizziness prior to the fall and no loss of consciousness after the fall.   Assessment/Plan:    Acute metabolic encephalopathy - resolved  - Likely due to polypharmacy and specific tramadol  which was recently started -CT head shows right parietal scalp hematoma measuring approximately 2.6 x 2.7 x 1.5 cm - Mentation has since were improved back to normal - UA negative, ammonia very mildly elevated (43 initially) and treated with lactulose , then normalized - TSH normal - Avoiding sedating and psychotropic medications going forward   Fall Left rectus sheath hematoma Right parietal scalp hematoma - INR elevated on admission 5.8 with peak at 6.1 and has undergone treatment with vitamin K  and since downtrended - INR 1.2 this morning -Imaging studies notable for hematoma involving left rectus sheath measuring 3.6 x 2.4 cm with possible tiny focus of active bleeding on admission.  Right parietal scalp hematoma noted -Plavix  and Coumadin  held on admission  PAF Supratherapeutic INR - On Coumadin  at home with supratherapeutic INR on admission which was reversed -  Rate controlled on Lopressor ; currently on hold - HAS-BLED = 4 points (high risk for major bleeding) (HTN, labile INR, age, high risk meds) - CHA2DS2-VASc = 6 points (9.7% risk per year) (age, sex, HTN, vascular disease, DM) - Family has discussed and are amenable with resuming Coumadin  at this time; reordered on 08/24/2024 - No dosage change recommended per pharmacy at discharge.  Supratherapeutic INR possibly in setting of recent Levaquin  use -Will need ongoing monitoring of INR at discharge   AKI -resolved - baseline creatinine of 0.8, on admission 1.32 - s/p IVF with normalized creat   Hypovolemic hyponatremia -resolved Sodium and chloride are improving continue IV fluids for an additional 24 hours.   Type 2 diabetes mellitus -Last A1c 7.3% on 07/19/2024 Continue sliding scale insulin    CAD - At home on Plavix , statin, metoprolol    Depression/anxiety Continue Effexor    Possible obstructive sleep apnea Continue CPAP at night.  Sleep study as an outpatient recommended She is becoming hypoxic during sleep  Interval History:  No events overnight.  Resting comfortably in bed. Stable for discharge to rehab at this point. Awaiting insurance auth.   Antimicrobials:   DVT prophylaxis:  Place and maintain sequential compression device Start: 08/21/24 2310   Code Status:   Code Status: Limited: Do not attempt resuscitation (DNR) -DNR-LIMITED -Do Not Intubate/DNI   Mobility Assessment (Last 72 Hours)     Mobility Assessment     Row Name 08/27/24 1200 08/26/24 20:26:43 08/26/24 0912 08/25/24 1915 08/25/24 1604   Does the patient have exclusion criteria? No- Perform mobility assessment No- Perform  mobility assessment No- Perform mobility assessment No- Perform mobility assessment No- Perform mobility assessment   What is the highest level of mobility based on the mobility assessment? Level 4 (Ambulates with assistance) - Balance while stepping forward/back - Complete Level 4  (Ambulates with assistance) - Balance while stepping forward/back - Complete Level 4 (Ambulates with assistance) - Balance while stepping forward/back - Complete Level 4 (Ambulates with assistance) - Balance while stepping forward/back - Complete Level 4 (Ambulates with assistance) - Balance while stepping forward/back - Complete    Row Name 08/24/24 1930           Does the patient have exclusion criteria? No- Perform mobility assessment       What is the highest level of mobility based on the mobility assessment? Level 4 (Ambulates with assistance) - Balance while stepping forward/back - Complete          Diet: Diet Orders (From admission, onward)     Start     Ordered   08/21/24 2246  Diet regular Room service appropriate? Yes; Fluid consistency: Thin  Diet effective now       Question Answer Comment  Room service appropriate? Yes   Fluid consistency: Thin      08/21/24 2249            Barriers to discharge: None Disposition Plan:  SNF HH orders placed: N/A Status is: Observation  Objective: Blood pressure (!) 140/70, pulse 75, temperature 98.6 F (37 C), temperature source Oral, resp. rate 20, height 5' (1.524 m), weight 52 kg, SpO2 99%.  Examination:  Physical Exam Constitutional:      Appearance: Normal appearance.  HENT:     Head: Normocephalic and atraumatic.     Comments: Bruising around right eye extending down [place] to her right neck    Mouth/Throat:     Mouth: Mucous membranes are moist.  Eyes:     Extraocular Movements: Extraocular movements intact.  Cardiovascular:     Rate and Rhythm: Normal rate and regular rhythm.  Pulmonary:     Effort: Pulmonary effort is normal. No respiratory distress.     Breath sounds: Normal breath sounds. No wheezing.  Abdominal:     General: Bowel sounds are normal. There is no distension.     Palpations: Abdomen is soft.     Tenderness: There is no abdominal tenderness.     Comments: Bruising noted along lower  middle/left sided abdomen  Musculoskeletal:        General: Normal range of motion.     Cervical back: Normal range of motion and neck supple.  Skin:    General: Skin is warm and dry.  Neurological:     General: No focal deficit present.     Mental Status: She is alert.  Psychiatric:        Mood and Affect: Mood normal.      Consultants:    Procedures:    Data Reviewed: Results for orders placed or performed during the hospital encounter of 08/21/24 (from the past 24 hours)  Glucose, capillary     Status: Abnormal   Collection Time: 08/26/24  5:40 PM  Result Value Ref Range   Glucose-Capillary 175 (H) 70 - 99 mg/dL  Glucose, capillary     Status: Abnormal   Collection Time: 08/26/24  9:25 PM  Result Value Ref Range   Glucose-Capillary 164 (H) 70 - 99 mg/dL  Protime-INR     Status: Abnormal   Collection Time: 08/27/24  5:59 AM  Result Value Ref Range   Prothrombin Time 16.8 (H) 11.4 - 15.2 seconds   INR 1.3 (H) 0.8 - 1.2  Glucose, capillary     Status: Abnormal   Collection Time: 08/27/24  7:50 AM  Result Value Ref Range   Glucose-Capillary 187 (H) 70 - 99 mg/dL   Comment 1 Notify RN   Glucose, capillary     Status: Abnormal   Collection Time: 08/27/24 11:59 AM  Result Value Ref Range   Glucose-Capillary 175 (H) 70 - 99 mg/dL   Comment 1 Notify RN     I have reviewed pertinent nursing notes, vitals, labs, and images as necessary. I have ordered labwork to follow up on as indicated.  I have reviewed the last notes from staff over past 24 hours. I have discussed patient's care plan and test results with nursing staff, CM/SW, and other staff as appropriate.  Old records reviewed in assessment of this patient    LOS: 0 days   Alm Apo, MD [facility] 08/27/2024, 3:30 PM "

## 2024-08-27 NOTE — Plan of Care (Signed)

## 2024-08-27 NOTE — Progress Notes (Signed)
" °   08/27/24 2233  BiPAP/CPAP/SIPAP  BiPAP/CPAP/SIPAP Pt Type Adult  Reason BIPAP/CPAP not in use Non-compliant (Patient refused CPAP qhs.  Patient states she was unable to tolerate it when she tried it.)  BiPAP/CPAP /SiPAP Vitals  Resp 15  MEWS Score/Color  MEWS Score 0  MEWS Score Color Green    "

## 2024-08-27 NOTE — Progress Notes (Signed)
 PHARMACY - ANTICOAGULATION CONSULT NOTE  Pharmacy Consult for warfarin Indication: atrial fibrillation  Allergies[1]  Patient Measurements: Height: 5' (152.4 cm) Weight: 52 kg (114 lb 10.2 oz) IBW/kg (Calculated) : 45.5 HEPARIN  DW (KG): 54.5  Vital Signs: Temp: 99.6 F (37.6 C) (12/25 0539) Temp Source: Oral (12/25 0539) BP: 170/64 (12/25 0539) Pulse Rate: 88 (12/25 0539)  Labs: Recent Labs    08/25/24 0826 08/26/24 0537 08/27/24 0559  LABPROT 15.3* 15.4* 16.8*  INR 1.1 1.2 1.3*    Estimated Creatinine Clearance: 37.6 mL/min (by C-G formula based on SCr of 0.68 mg/dL).   Medical History: Past Medical History:  Diagnosis Date   Allergy    Anal fissure    Anxiety    CAD (coronary artery disease)    s/p inf MI 2007 - tx w/ DES to RCA // s/p POBA to [facility] and DES to dRCA in 11/18 ([facility] in [place], [place]) // Myoview  3/21: low risk // s/p DES to pRCA   Cataract    removed both eyes   Cerebrovascular disease    Carotid US  09/2017 [facility]): mild plaque (<50%) in both carotid arteries   Chronic neck pain    Chronic pain syndrome    Diabetes Mellitus, Type 2    Diverticular disease    DJD (degenerative joint disease)    Echocardiogram 10/2018    Echo 10/2018: EF 55-60, normal RVSF, mod MAC, mod TR, severe AoV calcification and sclerosis with nodular calcium /mobile area of calcium  in the LVOT (small veg vs Lambl's excrescence  - consider TEE), mild AI, mild AS (mean 11).    Echocardiogram 04/2019    Echocardiogram 04/2019: EF 60-65, basal septal hypertrophy, grade 2 diastolic dysfunction, normal wall motion, normal RV SF, mild LAE, mod MAC, trivial MR, mod sclerosis of the aortic valve with mod aortic annular calcification, thin mobile filamentous structure on ventricular side of AV likely representing Lambl's excrescence, mild AI, mild TR   Frequent UTI's    Gastroesophageal reflux disease    H/O bacterial endocarditis    Rx w IV Abxs in 2020 ([facility] in [place],  [place]) // F/u echo with AoV Lambl's excrescence   H/O hiatal hernia    HTN (hypertension)    Hx of fall 10/2020   Hx of MI 2007   Hx of Stroke    IBS (irritable bowel syndrome)    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 (Cervical Fusion)   Premature ventricular contractions    Tubular adenoma of colon    Ulcer    sam [place] [facility]   Urinary incontinence     Medications:  Warfarin 7.5 mg on Mon/Fri, 5 mg all other days Last anticoag visit 12/3 - INR 1.9  Assessment: 83 year old female with history of atrial fibrillation on warfarin PTA presents with fall resulting in left rectus sheath hematoma and right parietal scalp hematoma. INR elevated on admission with peak to 6.1. She received two doses of Vitamin K  with drop in INR to 1.2. Pharmacy consulted to resume warfarin.  Patient previously on Eliquis  and switched to warfarin in August given high cost for Eliquis . Of note, INR mostly subtherapeutic  on anticoag visits since initiation. Pt has been on current dose of warfarin since October; it looks like patient was on short course of levofloxacin  in November, which could have potentially contributed to elevated INR although wouldn't expect to be seeing effects after several weeks. Likely multifactorial.  Today, 08/27/2024 - INR 1.3 = remains subtherapeutic, since resumed home regimen (5mg  dose given yesterday), which may now be due to temporary resistance s/p vitamin K  earlier this admit. Thus, will consider increased dose from home regimen today.  - last CBC (12/22) stable  - meal intake documented as 95% on 12/23   Goal of Therapy:  INR 2-3 Monitor platelets by anticoagulation protocol: Yes   Plan:   -Warfarin 7.5mg  PO x1 today at 1600 -For discharge, would continue home dose of warfarin and have patient follow up for INR check next week (if going to facility, may be able to check INR more frequently) -Daily INR while admitted -Monitor for any new or worsening signs/symptoms of bleeding   Marget Hench, PharmD Clinical Pharmacist 08/27/2024 8:04 AM          [1]  Allergies Allergen Reactions   Sulfa  Antibiotics Itching   Codeine Nausea Only   Loop Diuretics (Sulfonamide) Itching    Listed as sulfonamide in chart that needs merged   Niacin  And Related Other (See Comments)    Must take Flush-free   Prednisone Itching   Rocephin  [Ceftriaxone ] Itching   Sulfa  Antibiotics Itching   Codeine Nausea Only   Macrobid  [Nitrofurantoin ] Nausea Only    Severe nausea   Niacin  Other (See Comments)    Must take Flush-free   Prednisone Itching and Rash   Rocephin  [Ceftriaxone ] Itching    Has taken cefepime  without issue   Sulfonamide Derivatives Itching   Tetracycline Itching and Rash   Tetracyclines & Related Itching and Rash

## 2024-08-27 NOTE — Plan of Care (Signed)

## 2024-08-28 DIAGNOSIS — S0003XD Contusion of scalp, subsequent encounter: Secondary | ICD-10-CM | POA: Diagnosis not present

## 2024-08-28 DIAGNOSIS — S3011XA Contusion of abdominal wall, initial encounter: Secondary | ICD-10-CM | POA: Diagnosis not present

## 2024-08-28 DIAGNOSIS — W19XXXA Unspecified fall, initial encounter: Secondary | ICD-10-CM | POA: Diagnosis not present

## 2024-08-28 DIAGNOSIS — G9341 Metabolic encephalopathy: Secondary | ICD-10-CM | POA: Diagnosis not present

## 2024-08-28 LAB — PROTIME-INR
INR: 1.5 — ABNORMAL HIGH (ref 0.8–1.2)
Prothrombin Time: 18.9 s — ABNORMAL HIGH (ref 11.4–15.2)

## 2024-08-28 LAB — GLUCOSE, CAPILLARY
Glucose-Capillary: 169 mg/dL — ABNORMAL HIGH (ref 70–99)
Glucose-Capillary: 146 mg/dL — ABNORMAL HIGH (ref 70–99)
Glucose-Capillary: 171 mg/dL — ABNORMAL HIGH (ref 70–99)
Glucose-Capillary: 165 mg/dL — ABNORMAL HIGH (ref 70–99)

## 2024-08-28 MED ORDER — WARFARIN SODIUM 5 MG PO TABS
7.5000 mg | ORAL_TABLET | Freq: Once | ORAL | Status: AC
  Administered 2024-08-28: 7.5 mg via ORAL
  Filled 2024-08-28: qty 1

## 2024-08-28 MED ORDER — LIDOCAINE 5 % EX PTCH
1.0000 | MEDICATED_PATCH | CUTANEOUS | Status: DC
  Administered 2024-08-28 – 2024-08-29 (×2): 1 via TRANSDERMAL
  Filled 2024-08-28: qty 1

## 2024-08-28 NOTE — TOC Progression Note (Addendum)
 Transition of Care [facility]) - Progression Note    Patient Details  Name: [name] [name] MRN: [id number] Date of Birth: [birth year]  Transition of [facility]) CM/SW Contact  NORMAN ASPEN, LCSW Phone Number: 08/28/2024, 2:01 PM  Clinical Narrative:     ADDENDUM: Insurance has approved SNF admission and [facility] can admit patient tomorrow.  MD/RN and pt/family all aware and agreeable.  Will alert weekend IP CM coverage.  Peer to peer with medical director has been offered to attending MD for SNF authorization.  MD aware.  Expected Discharge Plan: Home w Home Health Services Barriers to Discharge: Continued Medical Work up               Expected Discharge Plan and Services   Discharge Planning Services: CM Consult   Living arrangements for the past 2 months: Apartment                 DME Arranged: N/A DME Agency: NA       HH Arranged: NA HH Agency: NA         Social Drivers of Health (SDOH) Interventions SDOH Screenings   Food Insecurity: No Food Insecurity (08/22/2024)  Housing: Low Risk (08/22/2024)  Transportation Needs: No Transportation Needs (08/22/2024)  Utilities: Not At Risk (08/22/2024)  Alcohol  Screen: Low Risk (11/03/2021)  Depression (PHQ2-9): Low Risk (05/18/2022)  Financial Resource Strain: Low Risk (11/03/2021)  Physical Activity: Sufficiently Active (11/03/2021)  Social Connections: Moderately Isolated (08/22/2024)  Stress: No Stress Concern Present (11/03/2021)  Tobacco Use: Low Risk (08/21/2024)    Readmission Risk Interventions     No data to display

## 2024-08-28 NOTE — Progress Notes (Signed)
" °   08/28/24 2043  BiPAP/CPAP/SIPAP  BiPAP/CPAP/SIPAP Pt Type Adult  Reason BIPAP/CPAP not in use Non-compliant (equipment remains at bedside)    "

## 2024-08-28 NOTE — Progress Notes (Signed)
 PHARMACY - ANTICOAGULATION CONSULT NOTE  Pharmacy Consult for warfarin Indication: atrial fibrillation  Allergies[1]  Patient Measurements: Height: 5' (152.4 cm) Weight: 50.6 kg (111 lb 8.8 oz) IBW/kg (Calculated) : 45.5 HEPARIN  DW (KG): 54.5  Vital Signs: Temp: 99 F (37.2 C) (12/26 0524) Temp Source: Oral (12/26 0524) BP: 167/84 (12/26 0839) Pulse Rate: 79 (12/26 0839)  Labs: Recent Labs    08/26/24 0537 08/27/24 0559 08/28/24 0522  LABPROT 15.4* 16.8* 18.9*  INR 1.2 1.3* 1.5*    Estimated Creatinine Clearance: 37.6 mL/min (by C-G formula based on SCr of 0.68 mg/dL).   Medical History: Past Medical History:  Diagnosis Date   Allergy    Anal fissure    Anxiety    CAD (coronary artery disease)    s/p inf MI 2007 - tx w/ DES to RCA // s/p POBA to [facility] and DES to dRCA in 11/18 ([facility] in [place], [place]) // Myoview  3/21: low risk // s/p DES to pRCA   Cataract    removed both eyes   Cerebrovascular disease    Carotid US  09/2017 [facility]): mild plaque (<50%) in both carotid arteries   Chronic neck pain    Chronic pain syndrome    Diabetes Mellitus, Type 2    Diverticular disease    DJD (degenerative joint disease)    Echocardiogram 10/2018    Echo 10/2018: EF 55-60, normal RVSF, mod MAC, mod TR, severe AoV calcification and sclerosis with nodular calcium /mobile area of calcium  in the LVOT (small veg vs Lambl's excrescence  - consider TEE), mild AI, mild AS (mean 11).    Echocardiogram 04/2019    Echocardiogram 04/2019: EF 60-65, basal septal hypertrophy, grade 2 diastolic dysfunction, normal wall motion, normal RV SF, mild LAE, mod MAC, trivial MR, mod sclerosis of the aortic valve with mod aortic annular calcification, thin mobile filamentous structure on ventricular side of AV likely representing Lambl's excrescence, mild AI, mild TR   Frequent UTI's    Gastroesophageal reflux disease    H/O bacterial endocarditis    Rx w IV Abxs in 2020 ([facility] in [place],  [place]) // F/u echo with AoV Lambl's excrescence   H/O hiatal hernia    HTN (hypertension)    Hx of fall 10/2020   Hx of MI 2007   Hx of Stroke    IBS (irritable bowel syndrome)    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 (Cervical Fusion)   Premature ventricular contractions    Tubular adenoma of colon    Ulcer    sam [place] [facility]   Urinary incontinence     Medications:  Warfarin 7.5 mg on Mon/Fri, 5 mg all other days Last anticoag visit 12/3 - INR 1.9  Assessment: 83 year old female with history of atrial fibrillation on warfarin PTA presents with fall resulting in left rectus sheath hematoma and right parietal scalp hematoma. INR elevated on admission with peak to 6.1. She received two doses of Vitamin K  with drop in INR to 1.2. Pharmacy consulted to resume warfarin.  Patient previously on Eliquis  and switched to warfarin in August given high cost for Eliquis . Of note, INR mostly subtherapeutic  on anticoag visits since initiation. Pt has been on current dose of warfarin since October; it looks like patient was on short course of levofloxacin  in November, which could have potentially contributed to elevated INR although wouldn't expect to be seeing effects after several weeks. Likely multifactorial.  Today, 08/27/2024 - INR 1.5 = remains subtherapeutic, since resumed home regimen (5mg  dose given yesterday), which may now be due to temporary resistance s/p vitamin K  earlier this admit. Thus, will consider increased dose from home regimen today.  - last CBC (12/22) stable  - meal intake documented as 100% on 12/25  Goal of Therapy:  INR 2-3 Monitor platelets by anticoagulation protocol: Yes   Plan:   -Warfarin home regimen today as above - 7.5mg  PO x1 today at 1600 -For discharge, would continue home dose of warfarin and have patient follow up for INR check next week (if going to facility, may be able to check INR more frequently) -Daily INR while admitted -Monitor for any new or worsening signs/symptoms of bleeding   Eva CHRISTELLA Allis, PharmD, BCPS Secure Chat if ?s 08/28/2024 8:52 AM            [1]  Allergies Allergen Reactions   Sulfa  Antibiotics Itching   Codeine Nausea Only   Loop Diuretics (Sulfonamide) Itching    Listed as sulfonamide in chart that needs merged   Niacin  And Related Other (See Comments)    Must take Flush-free   Prednisone Itching   Rocephin  [Ceftriaxone ] Itching   Sulfa  Antibiotics Itching   Codeine Nausea Only   Macrobid  [Nitrofurantoin ] Nausea Only    Severe nausea   Niacin  Other (See Comments)    Must take Flush-free   Prednisone Itching and Rash   Rocephin  [Ceftriaxone ] Itching    Has taken cefepime  without issue   Sulfonamide Derivatives Itching   Tetracycline Itching and Rash   Tetracyclines & Related Itching and Rash

## 2024-08-28 NOTE — Plan of Care (Signed)

## 2024-08-28 NOTE — Progress Notes (Signed)
 Occupational Therapy Treatment Patient Details Name: [name] [name] MRN: [id number] DOB: 05/01/1941 Today's Date: 08/28/2024   History of present illness [name] [name] is a 83 y.o. female with medical history significant for hypertension, hyperlipidemia, type 2 diabetes mellitus, CAD, HFpEF, history of CVA, history of DVT, and PAF on warfarin who presents for evaluation of confusion. Patient was brought into the ED by her daughter-in-law who reports that the patient has been intermittently confused and unsteady on her feet ever since her fall on 08/14/2024 for which she was evaluated in the ED   OT comments  Patient seen for skilled OT session. Patient open to all therapy presented and family- daughter arrived at end of session as well and reinforced family in agreement on rehab needs at this time. Patient improving with all aspects of ADL's and functional mobility but continues to present with high falls risk due to strength, activity tolerance, balance, and insight deficits with L shoulder pain persisting. Patient will benefit from continued inpatient follow up therapy, <3 hours/day. Patient requires continued Acute care hospital level OT services to progress safety and functional performance and allow for discharge.        If plan is discharge home, recommend the following:  A lot of help with walking and/or transfers;A little help with bathing/dressing/bathroom;Assistance with cooking/housework;Direct supervision/assist for medications management;Direct supervision/assist for financial management;Assist for transportation;Help with stairs or ramp for entrance;Supervision due to cognitive status   Equipment Recommendations  Tub/shower seat;BSC/3in1       Precautions / Restrictions Precautions Precautions: Fall Recall of Precautions/Restrictions: Impaired Precaution/Restrictions Comments: poor insight into deficitis, Poor safety awareness Restrictions Weight Bearing Restrictions Per  Provider Order: No       Mobility Bed Mobility Overal bed mobility: Modified Independent (patient seated sinkside upon OT arrival for session)                  Transfers Overall transfer level: Needs assistance Equipment used: Rolling walker (2 wheels) Transfers: Sit to/from Stand, Bed to chair/wheelchair/BSC Sit to Stand: Contact guard assist     Step pivot transfers: Contact guard assist, Min assist     General transfer comment: cues for steadiness, RW integration and safety measures     Balance Overall balance assessment: Needs assistance Sitting-balance support: No upper extremity supported, Feet supported Sitting balance-Leahy Scale: Normal   Postural control: Right lateral lean, Posterior lean Standing balance support: No upper extremity supported, During functional activity, Single extremity supported Standing balance-Leahy Scale: Fair Standing balance comment: unsteady when challenged                           ADL either performed or assessed with clinical judgement   ADL Overall ADL's : Needs assistance/impaired Eating/Feeding: Independent   Grooming: Wash/dry hands;Wash/dry face;Oral care;Applying deodorant;Contact guard assist;Sitting;Standing Grooming Details (indicate cue type and reason): intermittent seated and standing tasks with rests required Upper Body Bathing: Set up;Supervision/ safety;Sitting   Lower Body Bathing: Minimal assistance;Cueing for safety;Cueing for sequencing;Sit to/from stand   Upper Body Dressing : Set up;Supervision/safety;Sitting   Lower Body Dressing: Minimal assistance;Sit to/from stand   Toilet Transfer: Contact guard assist;Minimal assistance;Regular Toilet;Rolling walker (2 wheels) Toilet Transfer Details (indicate cue type and reason): min cues for RW integration safety Toileting- Clothing Manipulation and Hygiene: Contact guard assist;Sitting/lateral lean       Functional mobility during ADLs: Minimal  assistance;Contact guard assist General ADL Comments: cues required to follow safety routine, decreased reach to LE's  Extremity/Trunk Assessment Upper Extremity Assessment Upper Extremity Assessment: Generalized weakness;Right hand dominant;LUE deficits/detail LUE Deficits / Details: L shoulder AROM impaired, 0-75 degrees FF. pt reports ROM impaired prior but more since fall LUE: Shoulder pain with ROM   Lower Extremity Assessment Lower Extremity Assessment: Defer to PT evaluation                 Communication Communication Communication: No apparent difficulties;Impaired Factors Affecting Communication: Hearing impaired   Cognition Arousal: Alert   Cognition: Cognition impaired     Awareness: Intellectual awareness impaired Memory impairment (select all impairments): Short-term memory Attention impairment (select first level of impairment): Sustained attention Executive functioning impairment (select all impairments): Reasoning, Problem solving OT - Cognition Comments: decreased insight into deficits, decreased safety awareness and judgement                 Following commands: Impaired Following commands impaired: Follows multi-step commands inconsistently, Follows multi-step commands with increased time      Cueing   Cueing Techniques: Verbal cues, Gestural cues, Visual cues        General Comments bruising intermittenly over body from falls x 2 prior to hospitalization, requres seated rests due to activity tolerance deficits    Pertinent Vitals/ Pain       Pain Assessment Pain Assessment: 0-10 Pain Score: 4  Pain Location: L shoulder Pain Descriptors / Indicators: Sore Pain Intervention(s): Monitored during session, Repositioned   Frequency  Min 2X/week        Progress Toward Goals  OT Goals(current goals can now be found in the care plan section)  Progress towards OT goals: Progressing toward goals  Acute Rehab OT Goals Patient Stated  Goal: to get to rehab soon OT Goal Formulation: With patient/family Time For Goal Achievement: 09/05/24 Potential to Achieve Goals: Good ADL Goals Pt Will Perform Grooming: with supervision;with set-up;standing Pt Will Perform Upper Body Bathing: with set-up;sitting Pt Will Perform Lower Body Bathing: with min assist;with contact guard assist;sit to/from stand Pt Will Perform Upper Body Dressing: with set-up;sitting Pt Will Transfer to Toilet: with contact guard assist;with supervision;ambulating Pt Will Perform Toileting - Clothing Manipulation and hygiene: with contact guard assist;with supervision;sitting/lateral leans;sit to/from stand  Plan         AM-PAC OT 6 Clicks Daily Activity     Outcome Measure   Help from another person eating meals?: None Help from another person taking care of personal grooming?: A Little Help from another person toileting, which includes using toliet, bedpan, or urinal?: A Little Help from another person bathing (including washing, rinsing, drying)?: A Lot Help from another person to put on and taking off regular upper body clothing?: A Little Help from another person to put on and taking off regular lower body clothing?: A Lot 6 Click Score: 17    End of Session Equipment Utilized During Treatment: Gait belt;Rolling walker (2 wheels)  OT Visit Diagnosis: Unsteadiness on feet (R26.81);Other abnormalities of gait and mobility (R26.89);Repeated falls (R29.6);History of falling (Z91.81);Muscle weakness (generalized) (M62.81);Pain Pain - Right/Left: Left Pain - part of body: Shoulder   Activity Tolerance Patient tolerated treatment well   Patient Left in bed;with call bell/phone within reach;with bed alarm set;with family/visitor present (resting post ADL sink side)   Nurse Communication Mobility status        Time: 1040-1102 OT Time Calculation (min): 22 min  Charges: OT General Charges $OT Visit: 1 Visit OT Treatments $Self Care/Home  Management : 8-22 mins  Mccayla Shimada OT/L Acute Rehabilitation Department  [phone]  08/28/2024, 11:32 AM

## 2024-08-28 NOTE — Plan of Care (Signed)
" °  Problem: Clinical Measurements: Goal: Cardiovascular complication will be avoided Outcome: Progressing   Problem: Elimination: Goal: Will not experience complications related to urinary retention Outcome: Progressing   Problem: Skin Integrity: Goal: Risk for impaired skin integrity will decrease Outcome: Progressing   Problem: Education: Goal: Individualized Educational Video(s) Outcome: Progressing   Problem: Fluid Volume: Goal: Ability to maintain a balanced intake and output will improve Outcome: Progressing   Problem: Health Behavior/Discharge Planning: Goal: Ability to identify and utilize available resources and services will improve Outcome: Progressing   Problem: Metabolic: Goal: Ability to maintain appropriate glucose levels will improve Outcome: Progressing   "

## 2024-08-28 NOTE — Progress Notes (Signed)
 " Progress Note    [name] [name]   FMW:[id number]  DOB: 10-24-1940  DOA: 08/21/2024     0 PCP: Arloa Jarvis, NP  Initial CC: Fall at home  Hospital Course: [name] [name] is an 83 y.o. female past medical history significant for essential hypertension, diabetes mellitus type 2, HFpEF, history of CVA, history of DVT, paroxysmal atrial fibrillation on Coumadin  comes in for acute encephalopathy, daughter-in-law provides most of the history relates she has been intermittently confused and unsteady on her feet with a fall on 08/14/2024.  Was recently started on tramadol  after a fall sustained on 12/12 and now returning after another fall on 12/19.  She has recently relocated and in the process of moving in with one of her daughters, [name] [name].  She reported that she was on a stepstool/ladder and lost her footing and fell off.  Denied any dizziness prior to the fall and no loss of consciousness after the fall.   Assessment/Plan:    Acute metabolic encephalopathy - resolved  - Likely due to polypharmacy and specific tramadol  which was recently started -CT head shows right parietal scalp hematoma measuring approximately 2.6 x 2.7 x 1.5 cm - Mentation has since were improved back to normal - UA negative, ammonia very mildly elevated (43 initially) and treated with lactulose , then normalized - TSH normal - Avoiding sedating and psychotropic medications going forward   Fall Left rectus sheath hematoma Right parietal scalp hematoma - INR elevated on admission 5.8 with peak at 6.1 and has undergone treatment with vitamin K  and since downtrended - INR 1.2 this morning -Imaging studies notable for hematoma involving left rectus sheath measuring 3.6 x 2.4 cm with possible tiny focus of active bleeding on admission.  Right parietal scalp hematoma noted -Plavix  and Coumadin  held on admission  PAF Supratherapeutic INR - On Coumadin  at home with supratherapeutic INR on admission which was reversed -  Rate controlled on Lopressor ; currently on hold - HAS-BLED = 4 points (high risk for major bleeding) (HTN, labile INR, age, high risk meds) - CHA2DS2-VASc = 6 points (9.7% risk per year) (age, sex, HTN, vascular disease, DM) - Family has discussed and are amenable with resuming Coumadin  at this time; reordered on 08/24/2024 - No dosage change recommended per pharmacy at discharge.  Supratherapeutic INR possibly in setting of recent Levaquin  use -Will need ongoing monitoring of INR at discharge   AKI -resolved - baseline creatinine of 0.8, on admission 1.32 - s/p IVF with normalized creat   Hypovolemic hyponatremia -resolved Sodium and chloride are improving continue IV fluids for an additional 24 hours.   Type 2 diabetes mellitus -Last A1c 7.3% on 07/19/2024 Continue sliding scale insulin    CAD - At home on Plavix , statin, metoprolol    Depression/anxiety Continue Effexor    Possible obstructive sleep apnea Continue CPAP at night.  Sleep study as an outpatient recommended She is becoming hypoxic during sleep  Interval History:  No events overnight.  Resting comfortably in bed. Stable for discharge to rehab at this point. Awaiting insurance auth.  Performed P2P today, 12/26.   Antimicrobials:   DVT prophylaxis:  Place and maintain sequential compression device Start: 08/21/24 2310 warfarin (COUMADIN ) tablet 7.5 mg   Code Status:   Code Status: Limited: Do not attempt resuscitation (DNR) -DNR-LIMITED -Do Not Intubate/DNI   Mobility Assessment (Last 72 Hours)     Mobility Assessment     Row Name 08/28/24 1040 08/27/24 2000 08/27/24 1200 08/26/24 20:26:43 08/26/24 0912   Does the  patient have exclusion criteria? -- No- Perform mobility assessment No- Perform mobility assessment No- Perform mobility assessment No- Perform mobility assessment   What is the highest level of mobility based on the mobility assessment? Level 4 (Ambulates with assistance) - Balance while stepping  forward/back - Complete Level 4 (Ambulates with assistance) - Balance while stepping forward/back - Complete Level 4 (Ambulates with assistance) - Balance while stepping forward/back - Complete Level 4 (Ambulates with assistance) - Balance while stepping forward/back - Complete Level 4 (Ambulates with assistance) - Balance while stepping forward/back - Complete    Row Name 08/25/24 1915 08/25/24 1604         Does the patient have exclusion criteria? No- Perform mobility assessment No- Perform mobility assessment      What is the highest level of mobility based on the mobility assessment? Level 4 (Ambulates with assistance) - Balance while stepping forward/back - Complete Level 4 (Ambulates with assistance) - Balance while stepping forward/back - Complete         Diet: Diet Orders (From admission, onward)     Start     Ordered   08/21/24 2246  Diet regular Room service appropriate? Yes; Fluid consistency: Thin  Diet effective now       Question Answer Comment  Room service appropriate? Yes   Fluid consistency: Thin      08/21/24 2249            Barriers to discharge: None Disposition Plan:  SNF HH orders placed: N/A Status is: Observation  Objective: Blood pressure (!) 141/78, pulse 71, temperature 98.6 F (37 C), resp. rate 18, height 5' (1.524 m), weight 50.6 kg, SpO2 98%.  Examination:  Physical Exam Constitutional:      Appearance: Normal appearance.  HENT:     Head: Normocephalic and atraumatic.     Comments: Bruising around right eye extending down [place] to her right neck    Mouth/Throat:     Mouth: Mucous membranes are moist.  Eyes:     Extraocular Movements: Extraocular movements intact.  Cardiovascular:     Rate and Rhythm: Normal rate and regular rhythm.  Pulmonary:     Effort: Pulmonary effort is normal. No respiratory distress.     Breath sounds: Normal breath sounds. No wheezing.  Abdominal:     General: Bowel sounds are normal. There is no distension.      Palpations: Abdomen is soft.     Tenderness: There is no abdominal tenderness.     Comments: Bruising noted along lower middle/left sided abdomen  Musculoskeletal:        General: Normal range of motion.     Cervical back: Normal range of motion and neck supple.  Skin:    General: Skin is warm and dry.  Neurological:     General: No focal deficit present.     Mental Status: She is alert.  Psychiatric:        Mood and Affect: Mood normal.      Consultants:    Procedures:    Data Reviewed: Results for orders placed or performed during the hospital encounter of 08/21/24 (from the past 24 hours)  Glucose, capillary     Status: Abnormal   Collection Time: 08/27/24  4:44 PM  Result Value Ref Range   Glucose-Capillary 147 (H) 70 - 99 mg/dL   Comment 1 Notify RN   Glucose, capillary     Status: Abnormal   Collection Time: 08/27/24  9:31 PM  Result Value Ref Range  Glucose-Capillary 198 (H) 70 - 99 mg/dL  Protime-INR     Status: Abnormal   Collection Time: 08/28/24  5:22 AM  Result Value Ref Range   Prothrombin Time 18.9 (H) 11.4 - 15.2 seconds   INR 1.5 (H) 0.8 - 1.2  Glucose, capillary     Status: Abnormal   Collection Time: 08/28/24  7:15 AM  Result Value Ref Range   Glucose-Capillary 169 (H) 70 - 99 mg/dL  Glucose, capillary     Status: Abnormal   Collection Time: 08/28/24 11:21 AM  Result Value Ref Range   Glucose-Capillary 146 (H) 70 - 99 mg/dL    I have reviewed pertinent nursing notes, vitals, labs, and images as necessary. I have ordered labwork to follow up on as indicated.  I have reviewed the last notes from staff over past 24 hours. I have discussed patient's care plan and test results with nursing staff, CM/SW, and other staff as appropriate.  Old records reviewed in assessment of this patient    LOS: 0 days   Alm Apo, MD [facility] 08/28/2024, 3:48 PM "

## 2024-08-29 DIAGNOSIS — S0003XD Contusion of scalp, subsequent encounter: Secondary | ICD-10-CM | POA: Diagnosis not present

## 2024-08-29 DIAGNOSIS — G9341 Metabolic encephalopathy: Secondary | ICD-10-CM | POA: Diagnosis not present

## 2024-08-29 DIAGNOSIS — W19XXXA Unspecified fall, initial encounter: Secondary | ICD-10-CM | POA: Diagnosis not present

## 2024-08-29 DIAGNOSIS — S3011XA Contusion of abdominal wall, initial encounter: Secondary | ICD-10-CM | POA: Diagnosis not present

## 2024-08-29 DIAGNOSIS — R791 Abnormal coagulation profile: Secondary | ICD-10-CM | POA: Diagnosis not present

## 2024-08-29 DIAGNOSIS — Y92009 Unspecified place in unspecified non-institutional (private) residence as the place of occurrence of the external cause: Secondary | ICD-10-CM | POA: Diagnosis not present

## 2024-08-29 LAB — GLUCOSE, CAPILLARY
Glucose-Capillary: 190 mg/dL — ABNORMAL HIGH (ref 70–99)
Glucose-Capillary: 109 mg/dL — ABNORMAL HIGH (ref 70–99)

## 2024-08-29 LAB — PROTIME-INR
INR: 1.7 — ABNORMAL HIGH (ref 0.8–1.2)
Prothrombin Time: 21 s — ABNORMAL HIGH (ref 11.4–15.2)

## 2024-08-29 MED ORDER — WARFARIN SODIUM 5 MG PO TABS: 5.0000 mg | ORAL_TABLET | Freq: Once | ORAL | Status: DC

## 2024-08-29 MED ORDER — INSULIN ASPART 100 UNIT/ML IJ SOLN: 3.0000 [IU] | Freq: Three times a day (TID) | INTRAMUSCULAR | Status: AC

## 2024-08-29 MED ORDER — GABAPENTIN 100 MG PO CAPS: 200.0000 mg | ORAL_CAPSULE | Freq: Two times a day (BID) | ORAL | Status: AC

## 2024-08-29 MED ORDER — WARFARIN SODIUM 2.5 MG PO TABS: ORAL_TABLET | ORAL | Status: DC

## 2024-08-29 MED ORDER — INSULIN ASPART 100 UNIT/ML IJ SOLN: 0.0000 [IU] | Freq: Three times a day (TID) | INTRAMUSCULAR | Status: AC

## 2024-08-29 NOTE — Progress Notes (Signed)
 PHARMACY - ANTICOAGULATION CONSULT NOTE  Pharmacy Consult for warfarin Indication: atrial fibrillation  Allergies[1]  Patient Measurements: Height: 5' (152.4 cm) Weight: 50.7 kg (111 lb 12.4 oz) IBW/kg (Calculated) : 45.5 HEPARIN  DW (KG): 54.5  Vital Signs: Temp: 98.4 F (36.9 C) (12/27 0445) Temp Source: Oral (12/27 0445) BP: 152/62 (12/27 0445) Pulse Rate: 76 (12/27 0445)  Labs: Recent Labs    08/27/24 0559 08/28/24 0522 08/29/24 0603  LABPROT 16.8* 18.9* 21.0*  INR 1.3* 1.5* 1.7*    Estimated Creatinine Clearance: 37.6 mL/min (by C-G formula based on SCr of 0.68 mg/dL).   Medical History: Past Medical History:  Diagnosis Date   Allergy    Anal fissure    Anxiety    CAD (coronary artery disease)    s/p inf MI 2007 - tx w/ DES to RCA // s/p POBA to [facility] and DES to dRCA in 11/18 ([facility] in [place], [place]) // Myoview  3/21: low risk // s/p DES to pRCA   Cataract    removed both eyes   Cerebrovascular disease    Carotid US  09/2017 [facility]): mild plaque (<50%) in both carotid arteries   Chronic neck pain    Chronic pain syndrome    Diabetes Mellitus, Type 2    Diverticular disease    DJD (degenerative joint disease)    Echocardiogram 10/2018    Echo 10/2018: EF 55-60, normal RVSF, mod MAC, mod TR, severe AoV calcification and sclerosis with nodular calcium /mobile area of calcium  in the LVOT (small veg vs Lambl's excrescence  - consider TEE), mild AI, mild AS (mean 11).    Echocardiogram 04/2019    Echocardiogram 04/2019: EF 60-65, basal septal hypertrophy, grade 2 diastolic dysfunction, normal wall motion, normal RV SF, mild LAE, mod MAC, trivial MR, mod sclerosis of the aortic valve with mod aortic annular calcification, thin mobile filamentous structure on ventricular side of AV likely representing Lambl's excrescence, mild AI, mild TR   Frequent UTI's    Gastroesophageal reflux disease    H/O bacterial endocarditis    Rx w IV Abxs in 2020 ([facility] in  [place], [place]) // F/u echo with AoV Lambl's excrescence   H/O hiatal hernia    HTN (hypertension)    Hx of fall 10/2020   Hx of MI 2007   Hx of Stroke    IBS (irritable bowel syndrome)    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 (Cervical Fusion)   Premature ventricular contractions    Tubular adenoma of colon    Ulcer    [facility]   Urinary incontinence     Medications:  Warfarin 7.5 mg on Mon/Fri, 5 mg all other days Last anticoag visit 12/3 - INR 1.9  Assessment: 83 year old female with history of atrial fibrillation on warfarin PTA presents with fall resulting in left rectus sheath hematoma and right parietal scalp hematoma. INR elevated on admission with peak to 6.1. She received two doses of Vitamin K  with drop in INR to 1.2. Pharmacy consulted to resume warfarin.  Patient previously on Eliquis  and switched to warfarin in August given high cost for Eliquis . Of note, INR mostly subtherapeutic  on anticoag visits since initiation. Pt has been on current dose of warfarin since October; it looks like patient was on short course of levofloxacin  in November, which could have potentially contributed to elevated INR although wouldn't expect to be seeing effects after several weeks. Likely multifactorial.  Today, 12/27 INR remains subtherapeutic but trending up to 1.7. Last CBC (12/22) stable   Goal of Therapy:  INR 2-3 Monitor platelets by anticoagulation protocol: Yes   Plan:  Give warfarin 5mg  PO x 1  Monitor daily INR, CBC, s/s of bleed  [place], PharmD, BCPS, BCIDP Clinical Pharmacist 08/29/2024 8:28 AM             [1]  Allergies Allergen Reactions   Sulfa   Antibiotics Itching   Codeine Nausea Only   Loop Diuretics (Sulfonamide) Itching    Listed as sulfonamide in chart that needs merged   Niacin  And Related Other (See Comments)    Must take Flush-free   Prednisone Itching   Rocephin  [Ceftriaxone ] Itching   Sulfa  Antibiotics Itching   Codeine Nausea Only   Macrobid  [Nitrofurantoin ] Nausea Only    Severe nausea   Niacin  Other (See Comments)    Must take Flush-free   Prednisone Itching and Rash   Rocephin  [Ceftriaxone ] Itching    Has taken cefepime  without issue   Sulfonamide Derivatives Itching   Tetracycline Itching and Rash   Tetracyclines & Related Itching and Rash

## 2024-08-29 NOTE — Discharge Summary (Signed)
 " Physician Discharge Summary   [name] [name] FMW:[id number] DOB: 1941-06-17 DOA: 08/21/2024  PCP: Arloa Jarvis, NP  Admit date: 08/21/2024 Discharge date: 08/29/2024  Admitted From: Home  Disposition:  [facility] Discharging physician: Alm Apo, MD Barriers to discharge: awaiting ins approval  Recommendations at discharge: Please check INR daily or every other day if possible while INR continues to trend back up s/p reversal    Discharge Condition: stable CODE STATUS: DNR Diet recommendation:  Diet Orders (From admission, onward)     Start     Ordered   08/29/24 0000  Diet general        08/29/24 0955   08/21/24 2246  Diet regular Room service appropriate? Yes; Fluid consistency: Thin  Diet effective now       Question Answer Comment  Room service appropriate? Yes   Fluid consistency: Thin      08/21/24 2249            Hospital Course: [name] [name] is an 83 y.o. female past medical history significant for essential hypertension, diabetes mellitus type 2, HFpEF, history of CVA, history of DVT, paroxysmal atrial fibrillation on Coumadin  comes in for acute encephalopathy, daughter-in-law provides most of the history relates she has been intermittently confused and unsteady on her feet with a fall on 08/14/2024.  Was recently started on tramadol  after a fall sustained on 12/12 and now returning after another fall on 12/19.  She has recently relocated and in the process of moving in with one of her daughters, [name].  She reported that she was on a stepstool/ladder and lost her footing and fell off.  Denied any dizziness prior to the fall and no loss of consciousness after the fall.   Assessment/Plan:    Acute metabolic encephalopathy - resolved  - Likely due to polypharmacy and specific tramadol  which was recently started -CT head shows right parietal scalp hematoma measuring approximately 2.6 x 2.7 x 1.5 cm - Mentation has since were improved back to normal -  UA negative, ammonia very mildly elevated (43 initially) and treated with lactulose , then normalized - TSH normal - Avoiding sedating and psychotropic medications going forward   Fall Left rectus sheath hematoma Right parietal scalp hematoma - INR elevated on admission 5.8 with peak at 6.1 and has undergone treatment with vitamin K  and since downtrended - INR 1.2 this morning -Imaging studies notable for hematoma involving left rectus sheath measuring 3.6 x 2.4 cm with possible tiny focus of active bleeding on admission.  Right parietal scalp hematoma noted -Plavix  and Coumadin  held on admission; ultimately resumed   PAF Supratherapeutic INR - On Coumadin  at home with supratherapeutic INR on admission which was reversed - Rate controlled on Lopressor ; currently on hold - HAS-BLED = 4 points (high risk for major bleeding) (HTN, labile INR, age, high risk meds) - CHA2DS2-VASc = 6 points (9.7% risk per year) (age, sex, HTN, vascular disease, DM) - Family has discussed and are amenable with resuming Coumadin  at this time; reordered on 08/24/2024 - No dosage change recommended per pharmacy at discharge.  Supratherapeutic INR possibly in setting of recent Levaquin  use -Will need ongoing monitoring of INR at discharge   AKI -resolved - baseline creatinine of 0.8, on admission 1.32 - s/p IVF with normalized creat   Hypovolemic hyponatremia -resolved Sodium and chloride are improving continue IV fluids for an additional 24 hours.   Type 2 diabetes mellitus -Last A1c 7.3% on 07/19/2024 Continue sliding scale insulin    CAD -  At home on Plavix , statin, metoprolol    Depression/anxiety Continue Effexor    Possible obstructive sleep apnea Continue CPAP at night.  Sleep study as an outpatient recommended She is becoming hypoxic during sleep   Principal Diagnosis: Acute metabolic encephalopathy  Discharge Diagnoses: Active Hospital Problems   Diagnosis Date Noted   Fall at home,  initial encounter 08/23/2024    Priority: 1.   Scalp hematoma 08/21/2024    Priority: 2.   Rectus sheath hematoma 08/21/2024    Priority: 2.   Supratherapeutic INR 08/21/2024    Priority: 2.   PAF (paroxysmal atrial fibrillation) (HCC) 07/19/2024    Priority: 4.   T2DM (type 2 diabetes mellitus) (HCC) 07/19/2024   Essential hypertension 08/03/2009   CAD (coronary artery disease) 01/14/2008   Depression with anxiety 06/13/2007    Resolved Hospital Problems   Diagnosis Date Noted Date Resolved   Acute metabolic encephalopathy 08/21/2024 08/23/2024    Priority: 1.   AKI (acute kidney injury) 07/19/2024 08/23/2024    Priority: 3.     Discharge Instructions     Diet general   Complete by: As directed    Increase activity slowly   Complete by: As directed       Allergies as of 08/29/2024       Reactions   Sulfa  Antibiotics Itching   Codeine Nausea Only   Loop Diuretics (sulfonamide) Itching   Listed as sulfonamide in chart that needs merged   Niacin  And Related Other (See Comments)   Must take Flush-free   Prednisone Itching   Rocephin  [ceftriaxone ] Itching   Sulfa  Antibiotics Itching   Codeine Nausea Only   Macrobid  [nitrofurantoin ] Nausea Only   Severe nausea   Niacin  Other (See Comments)   Must take Flush-free   Prednisone Itching, Rash   Rocephin  [ceftriaxone ] Itching   Has taken cefepime  without issue   Sulfonamide Derivatives Itching   Tetracycline Itching, Rash   Tetracyclines & Related Itching, Rash        Medication List     PAUSE taking these medications    Lantus  SoloStar 100 UNIT/ML Solostar Pen Wait to take this until your doctor or other care provider tells you to start again. Resume if CBGs elevated consistently > 180 Generic drug: insulin  glargine Inject 10 Units into the skin daily for diabetes.       STOP taking these medications    metoprolol  tartrate 100 MG tablet Commonly known as: LOPRESSOR    nitrofurantoin  50 MG  capsule Commonly known as: MACRODANTIN    traMADol  50 MG tablet Commonly known as: ULTRAM        TAKE these medications    Accu-Chek Guide Test test strip Generic drug: glucose blood Use strips as directed to check blood sugar twice daily   Accu-Chek Guide Test test strip Generic drug: glucose blood Use to monitor Blood Sugars daily.   Accu-Chek Guide w/Device Kit Use to monitor Blood Sugars daily.   Accu-Chek Softclix Lancets lancets Use to check blood sugar 2 (two) times daily.   Accu-Chek Softclix Lancets lancets Use to monitor Blood Sugars daily.   acetaminophen  325 MG tablet Commonly known as: TYLENOL  Take 650 mg by mouth every 6 (six) hours as needed for moderate pain.   atorvastatin  80 MG tablet Commonly known as: LIPITOR  Take 1 tablet (80 mg total) by mouth at bedtime.   Cinnamon 500 MG capsule Take 1,000 mg by mouth daily.   clopidogrel  75 MG tablet Commonly known as: PLAVIX  Take 1 tablet (75 mg total) by  mouth in the morning to prevent blood clots.   dicyclomine  10 MG capsule Commonly known as: BENTYL  Take 1 capsule (10 mg) by mouth every 8 hours (up to 3 times per day) for abdominal pain/spasms as needed. What changed: reasons to take this   ezetimibe  10 MG tablet Commonly known as: ZETIA  Take 1 tablet (10 mg total) by mouth daily for cholesterol.   famotidine  20 MG tablet Commonly known as: PEPCID  Take 1 tablet (20 mg total) by mouth daily as needed for severe indigestion and heartburn What changed: reasons to take this   Fish Oil 1000 MG Caps Take 1,000 mg by mouth in the morning and at bedtime.   gabapentin  100 MG capsule Commonly known as: NEURONTIN  Take 2 capsules (200 mg total) by mouth 2 (two) times daily. What changed:  medication strength how much to take when to take this Another medication with the same name was removed. Continue taking this medication, and follow the directions you see here.   insulin  aspart 100 UNIT/ML  injection Commonly known as: novoLOG  Inject 0-6 Units into the skin 4 (four) times daily -  before meals and at bedtime.   insulin  aspart 100 UNIT/ML injection Commonly known as: novoLOG  Inject 3 Units into the skin 3 (three) times daily with meals.   Insupen Pen Needles 32G X 4 MM Misc Generic drug: Insulin  Pen Needle Use as directed to administer insulin  daily.   losartan  50 MG tablet Commonly known as: COZAAR  Take 1 tablet (50 mg total) by mouth daily.   metoprolol  tartrate 50 MG tablet Commonly known as: LOPRESSOR  Take 50 mg by mouth 2 (two) times daily.   montelukast  10 MG tablet Commonly known as: SINGULAIR  Take 1 tablet (10 mg total) by mouth at bedtime for allergies.   nitroGLYCERIN  0.4 MG SL tablet Commonly known as: NITROSTAT  Dissolve 1 tablet under your tongue at the first sign of heart attack/chest pain; no more than 3 tablets within a 15 minute period.   ondansetron  4 MG disintegrating tablet Commonly known as: ZOFRAN -ODT Take 4 mg by mouth every 8 (eight) hours as needed for nausea or vomiting.   pantoprazole  40 MG tablet Commonly known as: PROTONIX  Take 1 tablet (40 mg total) by mouth 2 (two) times daily for indigestion and heart burn.   tiZANidine  4 MG tablet Commonly known as: ZANAFLEX  Take 1 tablet by mouth twice daily as needed for chronic shoulder/neck pain. What changed: Another medication with the same name was removed. Continue taking this medication, and follow the directions you see here.   venlafaxine  XR 37.5 MG 24 hr capsule Commonly known as: EFFEXOR -XR Take 1 capsule (37.5 mg total) by mouth daily for anxiety and depression.   Vitamin C  500 MG Caps Take 500 mg by mouth in the morning and at bedtime.   warfarin 2.5 MG tablet Commonly known as: COUMADIN  Take as directed. If you are unsure how to take this medication, talk to your nurse or doctor. Original instructions: Take 2 tablets (5mg ) daily except 3 tablets (7.5mg ) on Mondays and  Fridays. Or as directed by anticoagulation clinic        Contact information for after-discharge care     Destination     [facility] .   Service: Skilled Nursing Contact information: [address] [place]  [address] [phone]                    Allergies[1]  Consultations:   Procedures:   Discharge Exam: BP ROLLEN)  152/62 (BP Location: Left Arm)   Pulse 76   Temp 98.4 F (36.9 C) (Oral)   Resp 18   Ht 5' (1.524 m)   Wt 50.7 kg   SpO2 93%   BMI 21.83 kg/m  Physical Exam Constitutional:      Appearance: Normal appearance.  HENT:     Head: Normocephalic and atraumatic.     Comments: Bruising around right eye extending down Lambert to her right neck (improved). Hematoma noted right parietal scalp, stable    Mouth/Throat:     Mouth: Mucous membranes are moist.  Eyes:     Extraocular Movements: Extraocular movements intact.  Cardiovascular:     Rate and Rhythm: Normal rate and regular rhythm.  Pulmonary:     Effort: Pulmonary effort is normal. No respiratory distress.     Breath sounds: Normal breath sounds. No wheezing.  Abdominal:     General: Bowel sounds are normal. There is no distension.     Palpations: Abdomen is soft.     Tenderness: There is no abdominal tenderness.     Comments: Bruising noted along lower middle/left sided abdomen  Musculoskeletal:        General: Normal range of motion.     Cervical back: Normal range of motion and neck supple.  Skin:    General: Skin is warm and dry.  Neurological:     General: No focal deficit present.     Mental Status: She is alert.  Psychiatric:        Mood and Affect: Mood normal.      The results of significant diagnostics from this hospitalization (including imaging, microbiology, ancillary and laboratory) are listed below for reference.   Microbiology: No results found for this or any previous visit (from the past 240 hours).   Labs: BNP (last 3 results) Recent Labs     02/16/24 0759 02/17/24 0541 02/18/24 0457  BNP 175.5* 82.9 79.4   Basic Metabolic Panel: Recent Labs  Lab 08/23/24 0546 08/24/24 0519  NA 140 134*  K 3.5 3.4*  CL 103 95*  CO2 26 30  GLUCOSE 204* 174*  BUN 10 9  CREATININE 0.60 0.68  CALCIUM  8.9 9.0   Liver Function Tests: No results for input(s): AST, ALT, ALKPHOS, BILITOT, PROT, ALBUMIN in the last 168 hours. No results for input(s): LIPASE, AMYLASE in the last 168 hours. Recent Labs  Lab 08/23/24 0546  AMMONIA 32   CBC: Recent Labs  Lab 08/23/24 0546 08/24/24 0519  WBC 9.4 10.8*  HGB 12.3 12.5  HCT 37.9 38.0  MCV 94.5 93.4  PLT 308 321   Cardiac Enzymes: No results for input(s): CKTOTAL, CKMB, CKMBINDEX, TROPONINI in the last 168 hours. BNP: Invalid input(s): POCBNP CBG: Recent Labs  Lab 08/28/24 0715 08/28/24 1121 08/28/24 1623 08/28/24 2047 08/29/24 0756  GLUCAP 169* 146* 171* 165* 190*   D-Dimer No results for input(s): DDIMER in the last 72 hours. Hgb A1c No results for input(s): HGBA1C in the last 72 hours. Lipid Profile No results for input(s): CHOL, HDL, LDLCALC, TRIG, CHOLHDL, LDLDIRECT in the last 72 hours. Thyroid  function studies No results for input(s): TSH, T4TOTAL, T3FREE, THYROIDAB in the last 72 hours.  Invalid input(s): FREET3 Anemia work up No results for input(s): VITAMINB12, FOLATE, FERRITIN, TIBC, IRON, RETICCTPCT in the last 72 hours. Urinalysis    Component Value Date/Time   COLORURINE YELLOW 08/21/2024 1714   APPEARANCEUR CLEAR 08/21/2024 1714   LABSPEC 1.014 08/21/2024 1714   PHURINE 5.5 08/21/2024  1714   GLUCOSEU NEGATIVE 08/21/2024 1714   GLUCOSEU NEGATIVE 03/05/2022 0841   HGBUR NEGATIVE 08/21/2024 1714   BILIRUBINUR NEGATIVE 08/21/2024 1714   BILIRUBINUR negative 03/10/2015 1516   KETONESUR NEGATIVE 08/21/2024 1714   PROTEINUR NEGATIVE 08/21/2024 1714   UROBILINOGEN 1.0 03/05/2022 0841    NITRITE NEGATIVE 08/21/2024 1714   LEUKOCYTESUR NEGATIVE 08/21/2024 1714   Sepsis Labs Recent Labs  Lab 08/23/24 0546 08/24/24 0519  WBC 9.4 10.8*   Microbiology No results found for this or any previous visit (from the past 240 hours).  Procedures/Studies: CT ABDOMEN PELVIS W CONTRAST Result Date: 08/21/2024 EXAM: CT ABDOMEN AND PELVIS WITH CONTRAST 08/21/2024 06:20:01 PM TECHNIQUE: CT of the abdomen and pelvis was performed with the administration of 80 mL of iohexol  (OMNIPAQUE ) 300 MG/ML solution. Multiplanar reformatted images are provided for review. Automated exposure control, iterative reconstruction, and/or weight-based adjustment of the mA/kV was utilized to reduce the radiation dose to as low as reasonably achievable. COMPARISON: 07/19/2024 CLINICAL HISTORY: LLQ pain/tenderness. FINDINGS: LOWER CHEST: No acute abnormality. LIVER: The liver is unremarkable. GALLBLADDER AND BILE DUCTS: Cholecystectomy No biliary ductal dilatation. SPLEEN: No acute abnormality. PANCREAS: No acute abnormality. ADRENAL GLANDS: No acute abnormality. KIDNEYS, URETERS AND BLADDER: Scarring in both kidneys. No stones in the kidneys or ureters. No hydronephrosis. No perinephric or periureteral stranding. Urinary bladder is unremarkable. GI AND BOWEL: Stomach demonstrates no acute abnormality. Moderate fecal load. Appendectomy. There is no bowel obstruction. PERITONEUM AND RETROPERITONEUM: No ascites. No free air. VASCULATURE: Aorta is normal in caliber. Aortic atherosclerotic calcification. LYMPH NODES: No lymphadenopathy. REPRODUCTIVE ORGANS: Hysterectomy. BONES AND SOFT TISSUES: Ovoid hyperdensity in the left rectus sheath measuring 3.6 x 2.4 cm (series 6 image 35). This likely represents a hematoma. Possible tiny focus of active bleeding (series 6 image 34). Subcutaneous stranding in the left anterior abdominal wall suggesting contusion. No acute osseous abnormality. IMPRESSION: 1. Hematoma in the left rectus  sheath measuring 3.6 x 2.4 cm with possible tiny focus of active bleeding. 2. Subcutaneous stranding in the left ventral abdominal wall suggesting contusion. Electronically signed by: Norman Gatlin MD 08/21/2024 07:02 PM EST RP Workstation: [id number]   DG Chest Portable 1 View Result Date: 08/21/2024 EXAM: 1 VIEW(S) XRAY OF THE CHEST 08/21/2024 06:28:27 PM COMPARISON: 08/14/2024 CLINICAL HISTORY: AMS, borderline hypoxia FINDINGS: LUNGS AND PLEURA: Biopsy clip in left upper lung. Mildly elevated right hemidiaphragm. Left perihilar calcified granuloma. No pleural effusion. No pneumothorax. HEART AND MEDIASTINUM: Aortic atherosclerosis. No acute abnormality of the cardiac and mediastinal silhouettes. BONES AND SOFT TISSUES: Cervical fixation hardware noted. Cholecystectomy clips noted. Degenerative glenohumeral arthropathy bilaterally. No acute osseous abnormality. IMPRESSION: 1. No acute cardiopulmonary abnormality. Electronically signed by: Greig Pique MD 08/21/2024 06:58 PM EST RP Workstation: [id number]   CT CERVICAL SPINE WO CONTRAST Result Date: 08/21/2024 EXAM: CT CERVICAL SPINE WITHOUT CONTRAST 08/21/2024 04:45:37 PM TECHNIQUE: CT of the cervical spine was performed without the administration of intravenous contrast. Multiplanar reformatted images are provided for review. Automated exposure control, iterative reconstruction, and/or weight based adjustment of the mA/kV was utilized to reduce the radiation dose to as low as reasonably achievable. COMPARISON: CT of the cervical spine 08/14/2024. CLINICAL HISTORY: Fall 1 week ago. Diffuse pain. FINDINGS: BONES AND ALIGNMENT: Posterior fixation hardware is present bilaterally at C2-C3 and C3-C4. Solid anterior fusion is present C4-C7. No acute fracture or traumatic malalignment. DEGENERATIVE CHANGES: No significant degenerative changes. SOFT TISSUES: No prevertebral soft tissue swelling. IMPRESSION: 1. No acute cervical spine fracture or traumatic  malalignment. 2.  Postsurgical changes with posterior fixation hardware at C2-3 and C3-4 and solid anterior fusion at C4-7. Electronically signed by: Lonni Necessary MD 08/21/2024 05:33 PM EST RP Workstation: [id number]   CT Head Wo Contrast Result Date: 08/21/2024 EXAM: CT HEAD WITHOUT CONTRAST 08/21/2024 04:45:37 PM TECHNIQUE: CT of the head was performed without the administration of intravenous contrast. Automated exposure control, iterative reconstruction, and/or weight based adjustment of the mA/kV was utilized to reduce the radiation dose to as low as reasonably achievable. COMPARISON: 08/14/2024 CLINICAL HISTORY: Mental status change, unknown cause. Fall 1 week ago. Increased confusion and persistent diffuse pain. FINDINGS: BRAIN AND VENTRICLES: No acute hemorrhage. No evidence of acute infarct. 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 Mild periventricular and deep white matter hypodensity typical of chronic small vessel ischemia is stable. No hydrocephalus. No extra-axial collection. No mass effect or midline shift. ORBITS: Bilateral cataract resection. SINUSES: No acute abnormality. SOFT TISSUES AND SKULL: Right parietal scalp hematoma. The hematoma demonstrates a new central hyperdense component measuring 2.6 x 2.7 x 1.5 cm suggesting interval bleeding. Question additional fall. No underlying fracture or foreign body is present. No skull fracture. VASCULATURE: Atherosclerosis of skullbase vasculature without hyperdense vessel or abnormal calcification. IMPRESSION: 1. Right parietal scalp hematoma with a new central hyperdense component measuring 2.6 x 2.7 x 1.5 cm, suggesting interval bleeding, without underlying fracture or foreign body. Question additional fall. 2. Stable remote lacunar infarcts and mild chronic small vessel ischemic change. 3. No acute intracranial abnormalities. Electronically signed by: Lonni Necessary MD 08/21/2024 05:30 PM EST RP  Workstation: [id number]   CT Cervical Spine Wo Contrast Result Date: 08/14/2024 EXAM: CT CERVICAL SPINE WITHOUT CONTRAST 08/14/2024 08:52:48 PM TECHNIQUE: CT of the cervical spine was performed without the administration of intravenous contrast. Multiplanar reformatted images are provided for review. Automated exposure control, iterative reconstruction, and/or weight based adjustment of the mA/kV was utilized to reduce the radiation dose to as low as reasonably achievable. COMPARISON: None available. CLINICAL HISTORY: Neck trauma (Age >= 65y) FINDINGS: CERVICAL SPINE: BONES AND ALIGNMENT: Posterior cervical fusion hardware at C2-C4 and C6-T1. Anterior cervical fusion hardware at C6-T1. Osseous fusion at C4-C5. No acute fracture or traumatic malalignment. DEGENERATIVE CHANGES: No significant degenerative changes. SOFT TISSUES: No prevertebral soft tissue swelling. LUNGS: Fiducial marker in the left lung apex with adjacent stable 16 x 14 mm nodular opacity (image 78). IMPRESSION: 1. No acute abnormality of the cervical spine. 2. Stable postsurgical changes, as above. 3. Stable left apical nodular opacity with adjacent fiducial marker. Electronically signed by: Pinkie Pebbles MD 08/14/2024 08:58 PM EST RP Workstation: [id number]   CT Head Wo Contrast Result Date: 08/14/2024 EXAM: CT HEAD WITHOUT 08/14/2024 08:52:48 PM TECHNIQUE: CT of the head was performed without the administration of intravenous contrast. Automated exposure control, iterative reconstruction, and/or weight based adjustment of the mA/kV was utilized to reduce the radiation dose to as low as reasonably achievable. COMPARISON: 07/19/2024 CLINICAL HISTORY: Head trauma, intracranial venous injury suspected. FINDINGS: BRAIN AND VENTRICLES: No acute intracranial hemorrhage. No mass effect or midline shift. No extra-axial fluid collection. No evidence of acute infarct. No hydrocephalus. Subcortical and periventricular small vessel ischemic changes.  ORBITS: No acute abnormality. SINUSES AND MASTOIDS: No acute abnormality. SOFT TISSUES AND SKULL: No acute skull fracture. Moderate extracranial hematoma overlying the right parietal bone (image 25). IMPRESSION: 1. No acute intracranial abnormality. 2. Moderate right parietal scalp hematoma. Electronically signed by: Pinkie Pebbles MD 08/14/2024 08:55 PM EST RP Workstation: [id number]  DG Shoulder 1 View Left Result Date: 08/14/2024 EXAM: 1 VIEW(S) XRAY OF THE LEFT SHOULDER 08/14/2024 07:56:00 PM COMPARISON: 07/05/2023 CLINICAL HISTORY: fall FINDINGS: BONES AND JOINTS: Moderate to severe glenohumeral joint degenerative changes with joint space narrowing and osteophyte formation. Mild acromioclavicular joint degenerative changes. No acute fracture. No malalignment. Partial visualization of cervical spinal fusion hardware. SOFT TISSUES: Surgical clips overlying mediastinum. Atherosclerotic calcifications of the aorta. Visualized lung is unremarkable. IMPRESSION: 1. No evidence of acute traumatic injury. 2. 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 3. Mild acromioclavicular osteoarthritis. Electronically signed by: Oneil Devonshire MD 08/14/2024 08:03 PM EST RP Workstation: [id number]   DG Pelvis Portable Result Date: 08/14/2024 EXAM: 1 or 2 VIEW(S) XRAY OF THE PELVIS 08/14/2024 07:56:00 PM COMPARISON: 07/19/2024 CLINICAL HISTORY: fall FINDINGS: BONES AND JOINTS: No acute fracture. No malalignment. Degenerative changes of the visualized lower lumbar spine. Mild bilateral hip degenerative changes. SOFT TISSUES: Surgical clips in midline pelvis. IMPRESSION: 1. No evidence of acute traumatic injury. 2. Mild bilateral hip degenerative changes. Electronically signed by: Oneil Devonshire MD 08/14/2024 08:02 PM EST RP Workstation: [id number]   DG Chest Port 1 View Result Date: 08/14/2024 EXAM: 1 VIEW(S) XRAY OF THE CHEST 08/14/2024 07:56:00 PM COMPARISON: 07/19/2024 CLINICAL HISTORY: fall FINDINGS: LUNGS AND  PLEURA: Fiducial marker overlying left upper lung zone. No focal pulmonary opacity. No pleural effusion. No pneumothorax. HEART AND MEDIASTINUM: Atherosclerotic plaque. No acute abnormality of the cardiac and mediastinal silhouettes. BONES AND SOFT TISSUES: Right upper quadrant surgical clips. Cervical surgical hardware. No acute osseous abnormality. IMPRESSION: 1. No acute cardiopulmonary process. Electronically signed by: Greig Pique MD 08/14/2024 08:00 PM EST RP Workstation: [id number]     Time coordinating discharge: Over 30 minutes    Alm Apo, MD  [facility] 08/29/2024, 10:04 AM     [1]  Allergies Allergen Reactions   Sulfa  Antibiotics Itching   Codeine Nausea Only   Loop Diuretics (Sulfonamide) Itching    Listed as sulfonamide in chart that needs merged   Niacin  And Related Other (See Comments)    Must take Flush-free   Prednisone Itching   Rocephin  [Ceftriaxone ] Itching   Sulfa  Antibiotics Itching   Codeine Nausea Only   Macrobid  [Nitrofurantoin ] Nausea Only    Severe nausea   Niacin  Other (See Comments)    Must take Flush-free   Prednisone Itching and Rash   Rocephin  [Ceftriaxone ] Itching    Has taken cefepime  without issue   Sulfonamide Derivatives Itching   Tetracycline Itching and Rash   Tetracyclines & Related Itching and Rash   "

## 2024-08-29 NOTE — Progress Notes (Signed)
 Called report to RN at [facility]. RN aware that [facility] has been called for transport.

## 2024-08-29 NOTE — Plan of Care (Signed)

## 2024-08-29 NOTE — TOC Transition Note (Addendum)
 Transition of Care [facility]) - Discharge Note   Patient Details  Name: [name] [name] MRN: [id number] Date of Birth: Jan 07, 1941  [facility] CM/SW Contact:  Sonda Manuella Quill, RN Phone Number: 08/29/2024, 10:58 AM   Clinical Narrative:    D/C orders received; ins auth received Plan Auth ID # [id number]; Auth ID # [id number]; start 08/26/24 end 08/31/24; spoke w/ Brittany at [facility]; she gave RM # [phone], all report # [phone]; transport by [facility]; pt's dtr Rojelio Core [phone]) notified and agreed to d/c plan; D/C summary and SNF transfer report sent via hub; [facility] called for transport at 1100; spoke w/ operator # [id number] no IP CM needs.   Final next level of care: Skilled Nursing Facility Barriers to Discharge: No Barriers Identified   Patient Goals and CMS Choice Patient states their goals for this hospitalization and ongoing recovery are:: home     [place] ownership interest in [facility].provided to:: Patient    Discharge Placement              Patient chooses bed at: [facility] Patient to be transferred to facility by: [facility] Name of family member notified: Rojelio Core (dtr) [phone] Patient and family notified of of transfer: 08/29/24  Discharge Plan and Services Additional resources added to the After Visit Summary for     Discharge Planning Services: CM Consult            DME Arranged: N/A DME Agency: NA       HH Arranged: NA HH Agency: NA        Social Drivers of Health (SDOH) Interventions SDOH Screenings   Food Insecurity: No Food Insecurity (08/22/2024)  Housing: Low Risk (08/22/2024)  Transportation Needs: No Transportation Needs (08/22/2024)  Utilities: Not At Risk (08/22/2024)  Alcohol  Screen: Low Risk (11/03/2021)  Depression (PHQ2-9): Low Risk (05/18/2022)  Financial Resource Strain: Low Risk (11/03/2021)  Physical Activity: Sufficiently Active (11/03/2021)  Social Connections: Moderately Isolated (08/22/2024)   Stress: No Stress Concern Present (11/03/2021)  Tobacco Use: Low Risk (08/21/2024)     Readmission Risk Interventions     No data to display

## 2024-09-02 ENCOUNTER — Ambulatory Visit

## 2024-09-10 ENCOUNTER — Other Ambulatory Visit (HOSPITAL_COMMUNITY): Payer: Self-pay

## 2024-09-10 MED ORDER — ATORVASTATIN CALCIUM 80 MG PO TABS
80.0000 mg | ORAL_TABLET | Freq: Every day | ORAL | 0 refills | Status: DC
  Filled 2024-09-10: qty 30, 30d supply, fill #0

## 2024-09-10 MED ORDER — PANTOPRAZOLE SODIUM 40 MG PO TBEC
40.0000 mg | DELAYED_RELEASE_TABLET | Freq: Two times a day (BID) | ORAL | 0 refills | Status: DC
  Filled 2024-09-10: qty 60, 30d supply, fill #0

## 2024-09-10 MED ORDER — ONDANSETRON 4 MG PO TBDP
4.0000 mg | ORAL_TABLET | Freq: Three times a day (TID) | ORAL | 0 refills | Status: AC | PRN
  Filled 2024-09-10: qty 10, 4d supply, fill #0

## 2024-09-10 MED ORDER — METOPROLOL TARTRATE 50 MG PO TABS
50.0000 mg | ORAL_TABLET | Freq: Two times a day (BID) | ORAL | 0 refills | Status: AC
  Filled 2024-09-10: qty 60, 30d supply, fill #0

## 2024-09-10 MED ORDER — FAMOTIDINE 20 MG PO TABS
20.0000 mg | ORAL_TABLET | Freq: Every day | ORAL | 0 refills | Status: AC | PRN
  Filled 2024-09-10: qty 30, 30d supply, fill #0

## 2024-09-10 MED ORDER — GABAPENTIN 100 MG PO CAPS
200.0000 mg | ORAL_CAPSULE | Freq: Two times a day (BID) | ORAL | 0 refills | Status: AC
  Filled 2024-09-10: qty 120, 30d supply, fill #0

## 2024-09-10 MED ORDER — LOSARTAN POTASSIUM 50 MG PO TABS
50.0000 mg | ORAL_TABLET | Freq: Every evening | ORAL | 0 refills | Status: AC
  Filled 2024-09-10: qty 30, 30d supply, fill #0

## 2024-09-10 MED ORDER — CLOPIDOGREL BISULFATE 75 MG PO TABS
75.0000 mg | ORAL_TABLET | Freq: Every morning | ORAL | 0 refills | Status: DC
  Filled 2024-09-10: qty 30, 30d supply, fill #0

## 2024-09-10 MED ORDER — TIZANIDINE HCL 4 MG PO TABS
4.0000 mg | ORAL_TABLET | Freq: Two times a day (BID) | ORAL | 0 refills | Status: AC | PRN
  Filled 2024-09-10: qty 20, 10d supply, fill #0

## 2024-09-10 MED ORDER — DICYCLOMINE HCL 10 MG PO CAPS
10.0000 mg | ORAL_CAPSULE | Freq: Three times a day (TID) | ORAL | 0 refills | Status: AC | PRN
  Filled 2024-09-10: qty 20, 7d supply, fill #0

## 2024-09-10 MED ORDER — MONTELUKAST SODIUM 10 MG PO TABS
10.0000 mg | ORAL_TABLET | Freq: Every day | ORAL | 0 refills | Status: AC
  Filled 2024-09-10: qty 30, 30d supply, fill #0

## 2024-09-10 MED ORDER — VENLAFAXINE HCL ER 37.5 MG PO CP24
37.5000 mg | ORAL_CAPSULE | Freq: Every evening | ORAL | 0 refills | Status: AC
  Filled 2024-09-10: qty 30, 30d supply, fill #0

## 2024-09-10 MED ORDER — EZETIMIBE 10 MG PO TABS
10.0000 mg | ORAL_TABLET | Freq: Every evening | ORAL | 0 refills | Status: AC
  Filled 2024-09-10: qty 30, 30d supply, fill #0

## 2024-09-10 MED ORDER — INSULIN ASPART 100 UNIT/ML FLEXPEN
3.0000 [IU] | PEN_INJECTOR | Freq: Three times a day (TID) | SUBCUTANEOUS | 0 refills | Status: AC
  Filled 2024-09-10: qty 3, 30d supply, fill #0

## 2024-09-11 ENCOUNTER — Other Ambulatory Visit: Payer: Self-pay

## 2024-09-11 ENCOUNTER — Telehealth: Payer: Self-pay | Admitting: Pharmacist

## 2024-09-11 ENCOUNTER — Other Ambulatory Visit (HOSPITAL_COMMUNITY): Payer: Self-pay

## 2024-09-11 DIAGNOSIS — I480 Paroxysmal atrial fibrillation: Secondary | ICD-10-CM

## 2024-09-11 MED ORDER — WARFARIN SODIUM 5 MG PO TABS
5.0000 mg | ORAL_TABLET | Freq: Every evening | ORAL | 0 refills | Status: DC
  Filled 2024-09-11: qty 30, 26d supply, fill #0

## 2024-09-11 MED ORDER — WARFARIN SODIUM 1 MG PO TABS
0.5000 mg | ORAL_TABLET | Freq: Every day | ORAL | 0 refills | Status: DC
  Filled 2024-09-11: qty 15, 30d supply, fill #0

## 2024-09-11 NOTE — Telephone Encounter (Signed)
 Received a message from pharmacy that you were considering switching her from warfarin to Eliquis ?

## 2024-09-14 ENCOUNTER — Other Ambulatory Visit (HOSPITAL_COMMUNITY): Payer: Self-pay

## 2024-09-14 MED ORDER — APIXABAN 2.5 MG PO TABS: 2.5000 mg | ORAL_TABLET | Freq: Two times a day (BID) | ORAL | 5 refills | Status: AC

## 2024-09-14 MED ORDER — APIXABAN 2.5 MG PO TABS
2.5000 mg | ORAL_TABLET | Freq: Two times a day (BID) | ORAL | 0 refills | Status: AC
  Filled 2024-09-14 (×2): qty 60, 30d supply, fill #0

## 2024-09-14 NOTE — Telephone Encounter (Signed)
 Contacted patient and daughter. They are interested in switching from warfarin to Eliquis . Explained dosing schedule and differences between warfarin and Eliquis . Patient is moving to [place]  tomorrow for an indeterminate amount of time. Will be living with her daughter.
